# Patient Record
Sex: Female | Born: 1960
Health system: Southern US, Community
[De-identification: ages and names within clinical notes are randomized; demographics above are authoritative.]

## PROBLEM LIST (undated history)

## (undated) DIAGNOSIS — D649 Anemia, unspecified: Secondary | ICD-10-CM

## (undated) DIAGNOSIS — F419 Anxiety disorder, unspecified: Secondary | ICD-10-CM

## (undated) DIAGNOSIS — Z87442 Personal history of urinary calculi: Secondary | ICD-10-CM

## (undated) DIAGNOSIS — K219 Gastro-esophageal reflux disease without esophagitis: Secondary | ICD-10-CM

## (undated) DIAGNOSIS — F1110 Opioid abuse, uncomplicated: Secondary | ICD-10-CM

## (undated) DIAGNOSIS — M1990 Unspecified osteoarthritis, unspecified site: Secondary | ICD-10-CM

## (undated) DIAGNOSIS — M810 Age-related osteoporosis without current pathological fracture: Secondary | ICD-10-CM

## (undated) DIAGNOSIS — Z9884 Bariatric surgery status: Secondary | ICD-10-CM

## (undated) DIAGNOSIS — M069 Rheumatoid arthritis, unspecified: Secondary | ICD-10-CM

## (undated) DIAGNOSIS — R0600 Dyspnea, unspecified: Secondary | ICD-10-CM

## (undated) DIAGNOSIS — M359 Systemic involvement of connective tissue, unspecified: Secondary | ICD-10-CM

## (undated) DIAGNOSIS — K56609 Unspecified intestinal obstruction, unspecified as to partial versus complete obstruction: Secondary | ICD-10-CM

## (undated) DIAGNOSIS — I00 Rheumatic fever without heart involvement: Secondary | ICD-10-CM

## (undated) DIAGNOSIS — E039 Hypothyroidism, unspecified: Secondary | ICD-10-CM

## (undated) DIAGNOSIS — E079 Disorder of thyroid, unspecified: Secondary | ICD-10-CM

## (undated) DIAGNOSIS — Z9889 Other specified postprocedural states: Secondary | ICD-10-CM

## (undated) DIAGNOSIS — R011 Cardiac murmur, unspecified: Secondary | ICD-10-CM

## (undated) DIAGNOSIS — F319 Bipolar disorder, unspecified: Secondary | ICD-10-CM

## (undated) DIAGNOSIS — K760 Fatty (change of) liver, not elsewhere classified: Secondary | ICD-10-CM

## (undated) DIAGNOSIS — K5900 Constipation, unspecified: Secondary | ICD-10-CM

## (undated) DIAGNOSIS — I959 Hypotension, unspecified: Secondary | ICD-10-CM

## (undated) DIAGNOSIS — M797 Fibromyalgia: Secondary | ICD-10-CM

## (undated) DIAGNOSIS — R112 Nausea with vomiting, unspecified: Secondary | ICD-10-CM

## (undated) HISTORY — PX: HEEL SPUR EXCISION: SHX1733

## (undated) HISTORY — DX: Fibromyalgia: M79.7

## (undated) HISTORY — PX: TONSILLECTOMY: SUR1361

---

## 2001-07-31 HISTORY — PX: GASTRIC BYPASS: SHX52

## 2002-07-31 HISTORY — PX: CHOLECYSTECTOMY: SHX55

## 2002-10-29 ENCOUNTER — Encounter: Payer: Self-pay | Admitting: Rheumatology

## 2002-10-29 ENCOUNTER — Encounter: Admission: RE | Admit: 2002-10-29 | Discharge: 2002-10-29 | Payer: Self-pay | Admitting: Rheumatology

## 2003-12-15 ENCOUNTER — Other Ambulatory Visit: Payer: Self-pay

## 2004-06-16 ENCOUNTER — Encounter: Admission: RE | Admit: 2004-06-16 | Discharge: 2004-06-16 | Payer: Self-pay | Admitting: Unknown Physician Specialty

## 2004-09-22 ENCOUNTER — Encounter: Admission: RE | Admit: 2004-09-22 | Discharge: 2004-09-22 | Payer: Self-pay | Admitting: Unknown Physician Specialty

## 2004-11-24 ENCOUNTER — Encounter: Admission: RE | Admit: 2004-11-24 | Discharge: 2004-11-24 | Payer: Self-pay | Admitting: Unknown Physician Specialty

## 2004-12-01 ENCOUNTER — Encounter: Admission: RE | Admit: 2004-12-01 | Discharge: 2004-12-01 | Payer: Self-pay | Admitting: Dermatology

## 2004-12-29 ENCOUNTER — Encounter: Admission: RE | Admit: 2004-12-29 | Discharge: 2004-12-29 | Payer: Self-pay | Admitting: Dermatology

## 2005-01-10 ENCOUNTER — Encounter: Admission: RE | Admit: 2005-01-10 | Discharge: 2005-01-10 | Payer: Self-pay | Admitting: Interventional Radiology

## 2005-02-07 ENCOUNTER — Other Ambulatory Visit: Payer: Self-pay

## 2005-02-08 ENCOUNTER — Inpatient Hospital Stay: Payer: Self-pay | Admitting: Internal Medicine

## 2005-03-14 ENCOUNTER — Encounter: Admission: RE | Admit: 2005-03-14 | Discharge: 2005-03-14 | Payer: Self-pay | Admitting: Unknown Physician Specialty

## 2005-03-23 ENCOUNTER — Other Ambulatory Visit: Payer: Self-pay

## 2005-03-23 ENCOUNTER — Inpatient Hospital Stay: Payer: Self-pay | Admitting: Internal Medicine

## 2005-06-12 ENCOUNTER — Inpatient Hospital Stay: Payer: Self-pay | Admitting: Psychiatry

## 2005-06-12 ENCOUNTER — Other Ambulatory Visit: Payer: Self-pay

## 2005-06-12 ENCOUNTER — Inpatient Hospital Stay: Payer: Self-pay | Admitting: Infectious Diseases

## 2005-06-21 ENCOUNTER — Encounter: Admission: RE | Admit: 2005-06-21 | Discharge: 2005-06-21 | Payer: Self-pay | Admitting: Unknown Physician Specialty

## 2006-04-18 ENCOUNTER — Encounter: Payer: Self-pay | Admitting: Anesthesiology

## 2006-05-18 ENCOUNTER — Inpatient Hospital Stay: Payer: Self-pay | Admitting: Psychiatry

## 2006-05-18 ENCOUNTER — Other Ambulatory Visit: Payer: Self-pay

## 2006-05-24 ENCOUNTER — Ambulatory Visit: Payer: Self-pay | Admitting: Unknown Physician Specialty

## 2006-05-31 ENCOUNTER — Ambulatory Visit: Payer: Self-pay | Admitting: Unknown Physician Specialty

## 2007-08-01 HISTORY — PX: EXPLORATORY LAPAROTOMY: SUR591

## 2007-10-24 ENCOUNTER — Ambulatory Visit: Payer: Self-pay | Admitting: Internal Medicine

## 2007-12-26 ENCOUNTER — Ambulatory Visit: Payer: Self-pay | Admitting: Internal Medicine

## 2008-04-21 ENCOUNTER — Ambulatory Visit: Payer: Self-pay | Admitting: Internal Medicine

## 2008-04-23 ENCOUNTER — Ambulatory Visit: Payer: Self-pay | Admitting: Specialist

## 2008-06-22 ENCOUNTER — Ambulatory Visit: Payer: Self-pay | Admitting: Unknown Physician Specialty

## 2008-10-08 ENCOUNTER — Ambulatory Visit: Payer: Self-pay | Admitting: Unknown Physician Specialty

## 2009-08-28 ENCOUNTER — Ambulatory Visit: Payer: Self-pay

## 2009-09-01 ENCOUNTER — Other Ambulatory Visit: Payer: Self-pay

## 2009-09-02 ENCOUNTER — Ambulatory Visit: Payer: Self-pay

## 2010-07-07 ENCOUNTER — Ambulatory Visit: Payer: Self-pay

## 2010-07-17 ENCOUNTER — Emergency Department: Payer: Self-pay | Admitting: Emergency Medicine

## 2010-07-22 ENCOUNTER — Ambulatory Visit: Payer: Self-pay | Admitting: Internal Medicine

## 2010-08-02 ENCOUNTER — Ambulatory Visit: Payer: Self-pay | Admitting: Unknown Physician Specialty

## 2010-08-30 ENCOUNTER — Ambulatory Visit: Payer: Self-pay | Admitting: Gastroenterology

## 2010-08-31 ENCOUNTER — Ambulatory Visit: Payer: Self-pay | Admitting: Gastroenterology

## 2010-10-12 ENCOUNTER — Ambulatory Visit: Payer: Self-pay | Admitting: Internal Medicine

## 2010-10-30 ENCOUNTER — Ambulatory Visit: Payer: Self-pay | Admitting: Internal Medicine

## 2011-04-13 ENCOUNTER — Ambulatory Visit: Payer: Self-pay | Admitting: Gastroenterology

## 2011-07-05 ENCOUNTER — Ambulatory Visit: Payer: Self-pay

## 2011-07-10 LAB — PATHOLOGY REPORT

## 2011-09-23 ENCOUNTER — Emergency Department: Payer: Self-pay | Admitting: Emergency Medicine

## 2012-02-16 ENCOUNTER — Emergency Department: Payer: Self-pay | Admitting: Emergency Medicine

## 2012-02-29 ENCOUNTER — Ambulatory Visit: Payer: Self-pay

## 2012-08-07 ENCOUNTER — Ambulatory Visit: Payer: Self-pay | Admitting: Unknown Physician Specialty

## 2013-04-16 ENCOUNTER — Ambulatory Visit: Payer: Self-pay | Admitting: Physician Assistant

## 2013-04-24 ENCOUNTER — Emergency Department: Payer: Self-pay | Admitting: Emergency Medicine

## 2013-05-07 ENCOUNTER — Ambulatory Visit: Payer: Self-pay | Admitting: Internal Medicine

## 2013-05-07 ENCOUNTER — Ambulatory Visit: Payer: Self-pay | Admitting: Hematology and Oncology

## 2013-05-09 ENCOUNTER — Ambulatory Visit: Payer: Self-pay | Admitting: Hematology and Oncology

## 2013-05-09 LAB — CBC CANCER CENTER
Basophil #: 0 x10 3/mm (ref 0.0–0.1)
Basophil %: 0.8 %
Eosinophil #: 0.2 x10 3/mm (ref 0.0–0.7)
Eosinophil %: 5.3 %
HCT: 31.3 % — ABNORMAL LOW (ref 35.0–47.0)
HGB: 10.4 g/dL — ABNORMAL LOW (ref 12.0–16.0)
Lymphocyte #: 1.6 x10 3/mm (ref 1.0–3.6)
Lymphocyte %: 53 %
MCH: 32.2 pg (ref 26.0–34.0)
MCHC: 33.1 g/dL (ref 32.0–36.0)
MCV: 97 fL (ref 80–100)
Monocyte #: 0.2 x10 3/mm (ref 0.2–0.9)
Monocyte %: 6.3 %
Neutrophil #: 1 x10 3/mm — ABNORMAL LOW (ref 1.4–6.5)
Neutrophil %: 34.6 %
Platelet: 370 x10 3/mm (ref 150–440)
RBC: 3.22 10*6/uL — ABNORMAL LOW (ref 3.80–5.20)
RDW: 16.7 % — ABNORMAL HIGH (ref 11.5–14.5)
WBC: 3 x10 3/mm — ABNORMAL LOW (ref 3.6–11.0)

## 2013-05-31 ENCOUNTER — Ambulatory Visit: Payer: Self-pay | Admitting: Hematology and Oncology

## 2013-07-04 ENCOUNTER — Ambulatory Visit: Payer: Self-pay | Admitting: Hematology and Oncology

## 2013-07-10 ENCOUNTER — Emergency Department: Payer: Self-pay | Admitting: Emergency Medicine

## 2013-07-10 LAB — HEPATIC FUNCTION PANEL A (ARMC)
Albumin: 2.8 g/dL — ABNORMAL LOW
Alkaline Phosphatase: 88 U/L
Bilirubin, Direct: 0.2 mg/dL
Bilirubin,Total: 0.3 mg/dL
SGOT(AST): 72 U/L — ABNORMAL HIGH
SGPT (ALT): 31 U/L
Total Protein: 6.4 g/dL

## 2013-07-10 LAB — CBC WITH DIFFERENTIAL/PLATELET
Basophil #: 0 10*3/uL (ref 0.0–0.1)
Basophil %: 0.7 %
Eosinophil #: 0.2 10*3/uL (ref 0.0–0.7)
Eosinophil %: 3.4 %
HCT: 34.5 % — ABNORMAL LOW (ref 35.0–47.0)
HGB: 11 g/dL — ABNORMAL LOW (ref 12.0–16.0)
Lymphocyte #: 2.6 10*3/uL (ref 1.0–3.6)
Lymphocyte %: 58 %
MCH: 30.9 pg (ref 26.0–34.0)
MCHC: 32 g/dL (ref 32.0–36.0)
MCV: 97 fL (ref 80–100)
Monocyte #: 0.5 x10 3/mm (ref 0.2–0.9)
Monocyte %: 11.1 %
Neutrophil #: 1.2 10*3/uL — ABNORMAL LOW (ref 1.4–6.5)
Neutrophil %: 26.8 %
Platelet: 334 10*3/uL (ref 150–440)
RBC: 3.57 10*6/uL — ABNORMAL LOW (ref 3.80–5.20)
RDW: 15.4 % — ABNORMAL HIGH (ref 11.5–14.5)
WBC: 4.4 10*3/uL (ref 3.6–11.0)

## 2013-07-10 LAB — BASIC METABOLIC PANEL WITH GFR
Anion Gap: 5 — ABNORMAL LOW
BUN: 4 mg/dL — ABNORMAL LOW
Calcium, Total: 8.8 mg/dL
Chloride: 110 mmol/L — ABNORMAL HIGH
Co2: 30 mmol/L
Creatinine: 0.59 mg/dL — ABNORMAL LOW
EGFR (African American): >60
EGFR (Non-African Amer.): >60
Glucose: 113 mg/dL — ABNORMAL HIGH
Osmolality: 286
Potassium: 3.3 mmol/L — ABNORMAL LOW
Sodium: 145 mmol/L

## 2013-07-10 LAB — TROPONIN I: Troponin-I: <0.02 ng/mL

## 2013-07-10 LAB — AMMONIA: Ammonia, Plasma: <25 umol/L

## 2013-07-11 LAB — CBC CANCER CENTER
Basophil #: 0 x10 3/mm (ref 0.0–0.1)
Basophil %: 0.4 %
Eosinophil #: 0.1 x10 3/mm (ref 0.0–0.7)
Eosinophil %: 1.8 %
HCT: 34.2 % — ABNORMAL LOW (ref 35.0–47.0)
HGB: 11.1 g/dL — ABNORMAL LOW (ref 12.0–16.0)
Lymphocyte #: 2.2 x10 3/mm (ref 1.0–3.6)
Lymphocyte %: 48.5 %
MCH: 31.6 pg (ref 26.0–34.0)
MCHC: 32.5 g/dL (ref 32.0–36.0)
MCV: 97 fL (ref 80–100)
Monocyte #: 0.5 x10 3/mm (ref 0.2–0.9)
Monocyte %: 10.2 %
Neutrophil #: 1.8 x10 3/mm (ref 1.4–6.5)
Neutrophil %: 39.1 %
Platelet: 319 x10 3/mm (ref 150–440)
RBC: 3.52 10*6/uL — ABNORMAL LOW (ref 3.80–5.20)
RDW: 15.5 % — ABNORMAL HIGH (ref 11.5–14.5)
WBC: 4.5 x10 3/mm (ref 3.6–11.0)

## 2013-07-11 LAB — TSH: Thyroid Stimulating Horm: 3.34 u[IU]/mL

## 2013-07-11 LAB — URINALYSIS, COMPLETE
Bilirubin,UR: NEGATIVE
Glucose,UR: NEGATIVE mg/dL (ref 0–75)
Ketone: NEGATIVE
Nitrite: NEGATIVE
Ph: 7 (ref 4.5–8.0)
Protein: NEGATIVE
RBC,UR: 5 /HPF (ref 0–5)
Specific Gravity: 1.006 (ref 1.003–1.030)
Squamous Epithelial: <1
WBC UR: 248 /HPF (ref 0–5)

## 2013-07-11 LAB — COMPREHENSIVE METABOLIC PANEL
Albumin: 2.6 g/dL — ABNORMAL LOW (ref 3.4–5.0)
Alkaline Phosphatase: 91 U/L
Anion Gap: 9 (ref 7–16)
BUN: 6 mg/dL — ABNORMAL LOW (ref 7–18)
Bilirubin,Total: 0.3 mg/dL (ref 0.2–1.0)
Calcium, Total: 8.2 mg/dL — ABNORMAL LOW (ref 8.5–10.1)
Chloride: 110 mmol/L — ABNORMAL HIGH (ref 98–107)
Co2: 24 mmol/L (ref 21–32)
Creatinine: 0.64 mg/dL (ref 0.60–1.30)
EGFR (African American): >60
EGFR (Non-African Amer.): >60
Glucose: 135 mg/dL — ABNORMAL HIGH (ref 65–99)
Osmolality: 285 (ref 275–301)
Potassium: 3.2 mmol/L — ABNORMAL LOW (ref 3.5–5.1)
SGOT(AST): 148 U/L — ABNORMAL HIGH (ref 15–37)
SGPT (ALT): 41 U/L (ref 12–78)
Sodium: 143 mmol/L (ref 136–145)
Total Protein: 6.1 g/dL — ABNORMAL LOW (ref 6.4–8.2)

## 2013-07-11 LAB — FERRITIN: Ferritin (ARMC): 46 ng/mL (ref 8–388)

## 2013-07-11 LAB — IRON AND TIBC
Iron Bind.Cap.(Total): 309 ug/dL (ref 250–450)
Iron Saturation: 71 %
Iron: 220 ug/dL — ABNORMAL HIGH (ref 50–170)
Unbound Iron-Bind.Cap.: 89 ug/dL

## 2013-07-31 ENCOUNTER — Ambulatory Visit: Payer: Self-pay | Admitting: Hematology and Oncology

## 2013-08-11 ENCOUNTER — Emergency Department (HOSPITAL_COMMUNITY)
Admission: EM | Admit: 2013-08-11 | Discharge: 2013-08-11 | Disposition: A | Discharge status: Home / Self Care | Payer: Commercial Indemnity | Attending: Emergency Medicine | Admitting: Emergency Medicine

## 2013-08-11 ENCOUNTER — Encounter (HOSPITAL_COMMUNITY): Payer: Self-pay | Admitting: Emergency Medicine

## 2013-08-11 ENCOUNTER — Emergency Department (HOSPITAL_COMMUNITY): Payer: Commercial Indemnity

## 2013-08-11 DIAGNOSIS — R5383 Other fatigue: Principal | ICD-10-CM

## 2013-08-11 DIAGNOSIS — M1990 Unspecified osteoarthritis, unspecified site: Secondary | ICD-10-CM | POA: Insufficient documentation

## 2013-08-11 DIAGNOSIS — E079 Disorder of thyroid, unspecified: Secondary | ICD-10-CM | POA: Insufficient documentation

## 2013-08-11 DIAGNOSIS — M069 Rheumatoid arthritis, unspecified: Secondary | ICD-10-CM | POA: Insufficient documentation

## 2013-08-11 DIAGNOSIS — R531 Weakness: Secondary | ICD-10-CM

## 2013-08-11 DIAGNOSIS — F411 Generalized anxiety disorder: Secondary | ICD-10-CM | POA: Insufficient documentation

## 2013-08-11 DIAGNOSIS — F319 Bipolar disorder, unspecified: Secondary | ICD-10-CM | POA: Insufficient documentation

## 2013-08-11 DIAGNOSIS — K5900 Constipation, unspecified: Secondary | ICD-10-CM | POA: Insufficient documentation

## 2013-08-11 DIAGNOSIS — Z8679 Personal history of other diseases of the circulatory system: Secondary | ICD-10-CM | POA: Insufficient documentation

## 2013-08-11 DIAGNOSIS — M949 Disorder of cartilage, unspecified: Secondary | ICD-10-CM

## 2013-08-11 DIAGNOSIS — M899 Disorder of bone, unspecified: Secondary | ICD-10-CM | POA: Insufficient documentation

## 2013-08-11 DIAGNOSIS — D649 Anemia, unspecified: Secondary | ICD-10-CM | POA: Insufficient documentation

## 2013-08-11 DIAGNOSIS — Z79899 Other long term (current) drug therapy: Secondary | ICD-10-CM | POA: Insufficient documentation

## 2013-08-11 DIAGNOSIS — R5381 Other malaise: Secondary | ICD-10-CM | POA: Insufficient documentation

## 2013-08-11 HISTORY — DX: Anemia, unspecified: D64.9

## 2013-08-11 HISTORY — DX: Disorder of thyroid, unspecified: E07.9

## 2013-08-11 HISTORY — DX: Unspecified osteoarthritis, unspecified site: M19.90

## 2013-08-11 HISTORY — DX: Rheumatoid arthritis, unspecified: M06.9

## 2013-08-11 HISTORY — DX: Age-related osteoporosis without current pathological fracture: M81.0

## 2013-08-11 HISTORY — DX: Anxiety disorder, unspecified: F41.9

## 2013-08-11 HISTORY — DX: Constipation, unspecified: K59.00

## 2013-08-11 HISTORY — DX: Hypotension, unspecified: I95.9

## 2013-08-11 HISTORY — DX: Bipolar disorder, unspecified: F31.9

## 2013-08-11 LAB — URINALYSIS, ROUTINE W REFLEX MICROSCOPIC
Glucose, UA: NEGATIVE mg/dL
Hgb urine dipstick: NEGATIVE
Ketones, ur: NEGATIVE mg/dL
Nitrite: NEGATIVE
Protein, ur: NEGATIVE mg/dL
Specific Gravity, Urine: 1.023 (ref 1.005–1.030)
Urobilinogen, UA: 0.2 mg/dL (ref 0.0–1.0)
pH: 5.5 (ref 5.0–8.0)

## 2013-08-11 LAB — URINE MICROSCOPIC-ADD ON

## 2013-08-11 LAB — COMPREHENSIVE METABOLIC PANEL
ALT: 9 U/L (ref 0–35)
AST: 21 U/L (ref 0–37)
Albumin: 2.8 g/dL — ABNORMAL LOW (ref 3.5–5.2)
Alkaline Phosphatase: 157 U/L — ABNORMAL HIGH (ref 39–117)
BUN: 11 mg/dL (ref 6–23)
CO2: 27 mEq/L (ref 19–32)
Calcium: 8.3 mg/dL — ABNORMAL LOW (ref 8.4–10.5)
Chloride: 105 mEq/L (ref 96–112)
Creatinine, Ser: 0.58 mg/dL (ref 0.50–1.10)
GFR calc Af Amer: >90 mL/min (ref >90)
GFR calc non Af Amer: >90 mL/min (ref >90)
Glucose, Bld: 95 mg/dL (ref 70–99)
Potassium: 3.4 mEq/L — ABNORMAL LOW (ref 3.7–5.3)
Sodium: 144 mEq/L (ref 137–147)
Total Bilirubin: 0.2 mg/dL — ABNORMAL LOW (ref 0.3–1.2)
Total Protein: 6 g/dL (ref 6.0–8.3)

## 2013-08-11 LAB — CBC WITH DIFFERENTIAL/PLATELET
Basophils Absolute: 0 10*3/uL (ref 0.0–0.1)
Basophils Relative: 0 % (ref 0–1)
Eosinophils Absolute: 0.3 10*3/uL (ref 0.0–0.7)
Eosinophils Relative: 7 % — ABNORMAL HIGH (ref 0–5)
HCT: 27 % — ABNORMAL LOW (ref 36.0–46.0)
Hemoglobin: 8.5 g/dL — ABNORMAL LOW (ref 12.0–15.0)
Lymphocytes Relative: 43 % (ref 12–46)
Lymphs Abs: 1.5 10*3/uL (ref 0.7–4.0)
MCH: 33.2 pg (ref 26.0–34.0)
MCHC: 31.5 g/dL (ref 30.0–36.0)
MCV: 105.5 fL — ABNORMAL HIGH (ref 78.0–100.0)
Monocytes Absolute: 0.3 10*3/uL (ref 0.1–1.0)
Monocytes Relative: 9 % (ref 3–12)
Neutro Abs: 1.4 10*3/uL — ABNORMAL LOW (ref 1.7–7.7)
Neutrophils Relative %: 40 % — ABNORMAL LOW (ref 43–77)
Platelets: 224 10*3/uL (ref 150–400)
RBC: 2.56 MIL/uL — ABNORMAL LOW (ref 3.87–5.11)
RDW: 15.6 % — ABNORMAL HIGH (ref 11.5–15.5)
WBC: 3.4 10*3/uL — ABNORMAL LOW (ref 4.0–10.5)

## 2013-08-11 LAB — RAPID URINE DRUG SCREEN, HOSP PERFORMED
Amphetamines: NOT DETECTED
Barbiturates: NOT DETECTED
Benzodiazepines: POSITIVE — AB
Cocaine: NOT DETECTED
Opiates: NOT DETECTED
Tetrahydrocannabinol: NOT DETECTED

## 2013-08-11 LAB — GLUCOSE, CAPILLARY: Glucose-Capillary: 85 mg/dL (ref 70–99)

## 2013-08-11 LAB — TROPONIN I: Troponin I: <0.3 ng/mL (ref <0.30)

## 2013-08-11 NOTE — ED Notes (Signed)
Not urinated since last nite

## 2013-08-11 NOTE — ED Notes (Signed)
Case manager at bedside 

## 2013-08-11 NOTE — ED Notes (Signed)
Pt states that weakness  That comes and goes has been going on for a month has seen [facility] and multiple drs and they cannot find anything. pt states th for this but they did not find anything weakness has gotten worse over the last 2 days husband took her sugar this am and it was 50 since she has had an energy bar spaggo and bread tea and pineapple juice pt states she is bipolar and has anxiety attacks and she had one of those

## 2013-08-11 NOTE — ED Notes (Signed)
Return to room

## 2013-08-11 NOTE — Progress Notes (Addendum)
ED CM received referral from J Piepenbrink PA-C. Pt presented to ED with weakness and hypotension. Pt has hx of  rheumatoid arthritis, osteoarthritis, osteoporosis, thyroid disease, hypertension, anemia, constipation, hypotension, anemia, bipolar 1 disorder, anxiety. In room to speak with patient, husband at bedside. Permission obtained to discuss information in the presence of husband. Pt and  Husband states, that she is able to ambulate but needs to grab on to wall  for balance at times, husband believes she may benefit from a  Walker. She states this is a transient issue, it comes and goes, as per patient.   She  Pt reports a long history 2-3 page list of documented symptoms and scheduled doctor's appointments through April 2015 with similar symptoms that she is complaining about today that the husband shared with ED CM. She has had an extensive work-up with no significant finding. Pt and husband not happy with PCP states, they are in the midst of switching to another PCP has an appointment on Jan 22, with new PCP.  Pt is encouraged to f/u with the PCP appointment to establish care. Pt declines recommendation for [facility] but agrees with  Plan for Rolling for home use.  Pt. And husband verbalized understanding  And teach back method used.   Pt has [employer],  contacted  [employer] regarding DME coverage. Informed that patient has a out of pocket $500 deductible for in network DME. DME list provided and  Choice was given. Pt and husband decided on [facility] in [place] [phone].  Referral faxed in to [phone]. Fax confirmation received. Husband will contact them in the morning regarding picking up rolling walker. No further ED CM needs identified.

## 2013-08-11 NOTE — ED Provider Notes (Signed)
CSN: [id number]     Arrival date & time 08/11/13  1226 History   First MD Initiated Contact with Patient 08/11/13 1258     Chief Complaint  Patient presents with  . Weakness  . Hypotension   (Consider location/radiation/quality/duration/timing/severity/associated sxs/prior Treatment) HPI Comments: Patient is a 53 year old female past medical history significant for rheumatoid arthritis, osteoarthritis, osteoporosis, thyroid disease, hypertension, anemia, constipation, hypotension, anemia, bipolar 1 disorder, anxiety presented to the emergency department for multiple complaints. Patient's first complaint is several months of intermittent generalized diffuse weakness but does not lateralize to one extremity. Patient has been seen in the last month by neurology, gastroenterology, hematology with negative workups including but not limited to lab tests, EEGs, for increased fatigue and weakness without any findings. Patient and her husband have a 2-3 page list of documented symptoms and scheduled doctor's appointments through January 2013 with similar symptoms that she is complaining about today that the husband shared with me during evaluation. She denies any new acute changes to her symptoms in the last week. Patient's second complaint is an incident of hypoglycemia last evening with some slurred speech and visual disturbances. Her husband checked her glucose level last evening and it was 50. She was able to tolerate PO intake and her symptoms resolved. Patient has no history of DM, hypoglycemia.   The history is provided by the patient and the spouse.    Past Medical History  Diagnosis Date  . Osteoarthritis   . Osteoporosis   . Rheumatoid arthritis   . Thyroid disease   . Hypotension   . Constipation   . Anemia   . Bipolar 1 disorder   . Anxiety    Past Surgical History  Procedure Laterality Date  . Gastric bypass    . Cholecystectomy     No family history on file. History  Substance  Use Topics  . Smoking status: Never Smoker   . Smokeless tobacco: Not on file  . Alcohol Use: No   OB History   Grav Para Term Preterm Abortions TAB SAB Ect Mult Living                 Review of Systems  Constitutional: Positive for fatigue. Negative for fever and chills.  Respiratory: Negative for shortness of breath.   Cardiovascular: Negative for chest pain.  Gastrointestinal: Negative for nausea, vomiting and abdominal pain.  Neurological: Positive for weakness (generalized). Negative for dizziness, seizures, syncope, light-headedness, numbness and headaches.  All other systems reviewed and are negative.    Allergies  Review of patient's allergies indicates no known allergies.  Home Medications   Current Outpatient Rx  Name  Route  Sig  Dispense  Refill  . adalimumab (HUMIRA) 40 MG/0.8ML injection   Subcutaneous   Inject 40 mg into the skin every 14 (fourteen) days.         Marland Kitchen alendronate (FOSAMAX) 70 MG tablet   Oral   Take 70 mg by mouth once a week. Take with a full glass of water on an empty stomach.         Marland Kitchen buPROPion (WELLBUTRIN) 100 MG tablet   Oral   Take 100 mg by mouth 3 (three) times daily.         . Cholecalciferol (VITAMIN D3) 400 UNITS CAPS   Oral   Take 1 tablet by mouth daily.         . Cyanocobalamin (VITAMIN B-12 IJ)   Injection   Inject as directed every 30 (thirty) days.         Marland Kitchen  diazepam (VALIUM) 5 MG tablet   Oral   Take 5 mg by mouth 3 (three) times daily as needed for anxiety.         Marland Kitchen esomeprazole (NEXIUM) 40 MG capsule   Oral   Take 40 mg by mouth daily at 12 noon.         . furosemide (LASIX) 20 MG tablet   Oral   Take 20 mg by mouth daily as needed.         Marland Kitchen LACTULOSE PO   Oral   Take 30 mLs by mouth 3 (three) times daily.         Marland Kitchen lamoTRIgine (LAMICTAL) 200 MG tablet   Oral   Take 200 mg by mouth daily.         Marland Kitchen levothyroxine (SYNTHROID, LEVOTHROID) 75 MCG tablet   Oral   Take 75 mcg by  mouth daily before breakfast.         . Loratadine-Pseudoephedrine (CLARITIN-D 12 HOUR PO)   Oral   Take 1 tablet by mouth 2 (two) times daily as needed (allergies).         . Lurasidone HCl 120 MG TABS   Oral   Take 120 mg by mouth at bedtime.         . modafinil (PROVIGIL) 200 MG tablet   Oral   Take 200 mg by mouth 3 (three) times daily.         . Multiple Vitamins-Minerals (MULTIVITAMIN PO)   Oral   Take 1 tablet by mouth daily.         . Nutritional Supplements (ESTROVEN PO)   Oral   Take 1 tablet by mouth daily.         Marland Kitchen OLANZapine-FLUoxetine (SYMBYAX) 3-25 MG per capsule   Oral   Take 1 capsule by mouth every evening.         . rifaximin (XIFAXAN) 550 MG TABS tablet   Oral   Take 550 mg by mouth 2 (two) times daily.         . sucralfate (CARAFATE) 1 G tablet   Oral   Take 1 g by mouth 2 (two) times daily as needed (tomach pain).         . ursodiol (ACTIGALL) 300 MG capsule   Oral   Take 300 mg by mouth 2 (two) times daily.          BP 97/63  Pulse 75  Temp(Src) 98.4 F (36.9 C) (Oral)  Resp 20  SpO2 92% Physical Exam  Constitutional: She is oriented to person, place, and time. She appears well-developed and well-nourished. No distress.  HENT:  Head: Normocephalic and atraumatic.  Right Ear: External ear normal.  Left Ear: External ear normal.  Nose: Nose normal.  Mouth/Throat: Oropharynx is clear and moist. No oropharyngeal exudate.  Eyes: Conjunctivae and EOM are normal. Pupils are equal, round, and reactive to light.  Neck: Normal range of motion. Neck supple.  Cardiovascular: Normal rate, regular rhythm, normal heart sounds and intact distal pulses.   Pulmonary/Chest: Effort normal and breath sounds normal. No respiratory distress. She exhibits no tenderness.  Abdominal: Soft. There is no tenderness.  Musculoskeletal: Normal range of motion. She exhibits no tenderness.  Neurological: She is alert and oriented to person, place,  and time. She has normal strength and normal reflexes. She displays normal reflexes. No cranial nerve deficit or sensory deficit. Gait normal. GCS eye subscore is 4. GCS verbal subscore is 5. GCS motor subscore  is 6. She displays no Babinski's sign on the right side. She displays no Babinski's sign on the left side.  No pronator drift. Bilateral heel-knee-shin intact. Patient able to move herself from wheelchair to bed without difficulty or assistance. No asterixis appreciated on examination.  Skin: Skin is warm and dry. She is not diaphoretic.    ED Course  Procedures (including critical care time) Labs Review Labs Reviewed  COMPREHENSIVE METABOLIC PANEL - Abnormal; Notable for the following:    Potassium 3.4 (*)    Calcium 8.3 (*)    Albumin 2.8 (*)    Alkaline Phosphatase 157 (*)    Total Bilirubin 0.2 (*)    All other components within normal limits  CBC WITH DIFFERENTIAL - Abnormal; Notable for the following:    WBC 3.4 (*)    RBC 2.56 (*)    Hemoglobin 8.5 (*)    HCT 27.0 (*)    MCV 105.5 (*)    RDW 15.6 (*)    Neutrophils Relative % 40 (*)    Neutro Abs 1.4 (*)    Eosinophils Relative 7 (*)    All other components within normal limits  URINALYSIS, ROUTINE W REFLEX MICROSCOPIC - Abnormal; Notable for the following:    Color, Urine AMBER (*)    APPearance CLOUDY (*)    Bilirubin Urine SMALL (*)    Leukocytes, UA SMALL (*)    All other components within normal limits  URINE RAPID DRUG SCREEN (HOSP PERFORMED) - Abnormal; Notable for the following:    Benzodiazepines POSITIVE (*)    All other components within normal limits  URINE MICROSCOPIC-ADD ON - Abnormal; Notable for the following:    Crystals CA OXALATE CRYSTALS (*)    All other components within normal limits  GLUCOSE, CAPILLARY  TROPONIN I   Imaging Review Dg Chest 2 View  08/11/2013   CLINICAL DATA:  cough  EXAM: CHEST  2 VIEW  COMPARISON:  None.  FINDINGS: Normal mediastinum and cardiac silhouette. Mild  central venous pulmonary congestion. No evidence of effusion, infiltrate, or pneumothorax. No acute bony abnormality.  IMPRESSION: Mild central venous pulmonary congestion.   Electronically Signed   By: Suzy Bouchard M.D.   On: 08/11/2013 14:34    EKG Interpretation    Date/Time:  Monday August 11 2013 12:55:32 EST Ventricular Rate:  75 PR Interval:  146 QRS Duration: 84 QT Interval:  400 QTC Calculation: 446 R Axis:   23 Text Interpretation:  Normal sinus rhythm Cannot rule out Anterior infarct , age undetermined Abnormal ECG Confirmed by WARD  DO, KRISTEN ([id number]) on 08/11/2013 1:11:46 PM            MDM   1. Generalized weakness     Afebrile, NAD, non-toxic appearing, AAOx4. No neuro focal deficits on examination. DTRs normal and symmetrical. Patient able to move herself from wheelchair to bed without difficulty. Blood sugar normal at 85. Patient tolerating by mouth intake without difficulty. Labs reviewed without any acute or specific abnormality. Anemia noted with long-standing history, recent hematology evaluation within the last 3 weeks. Troponin negative, history unremarkable normal sinus rhythm, chest x-ray significant finding. Had extensive conversation, 30 minutes, with patient and husband regarding work up findings and that with chronicity of problems that it was highly unlikely to find any reason behind long standing problems. Discussed that patient needs to follow up with at PCP appointment and her specialists for continued medical evaluations to find underlying problem. Will have Case manager discuss possible at home  options for patient and family. They will provide patient a walker for at home use. Case manager again had extensive conversation regarding need for PCP f/u. Return precautions discussed. Patient is agreeable to plan. Patient is stable at time of discharge       Harlow Mares, PA-C 08/11/13 1837

## 2013-08-11 NOTE — ED Provider Notes (Signed)
Medical screening examination/treatment/procedure(s) were performed by non-physician practitioner and as supervising physician I was immediately available for consultation/collaboration.  EKG Interpretation    Date/Time:  Monday August 11 2013 12:55:32 EST Ventricular Rate:  75 PR Interval:  146 QRS Duration: 84 QT Interval:  400 QTC Calculation: 446 R Axis:   23 Text Interpretation:  Normal sinus rhythm Cannot rule out Anterior infarct , age undetermined Abnormal ECG Confirmed by Echo Propp  DO, Katesha Eichel ([id number]) on 08/11/2013 1:11:46 PM              [place], DO 08/11/13 1842

## 2013-08-11 NOTE — ED Notes (Signed)
Bladder scanned patient: 446ml. Pt. Ambulated to restroom with assist from family. Pt. Was able to urinate. Bladder scan post urination: 38ml

## 2013-08-11 NOTE — ED Notes (Signed)
Pt is here with weakness, hypoglycemia, hypotension.  Pt has some slurred speech.  Hypoglycemia started last nite, reports fuzzy vision

## 2013-08-11 NOTE — Discharge Instructions (Signed)
Please follow up with your primary care physician in 1-2 days. If you do not have one please call the [facility] number listed above. Please read all discharge instructions and return precautions.    Fatigue Fatigue is a feeling of tiredness, lack of energy, lack of motivation, or feeling tired all the time. Having enough rest, good nutrition, and reducing stress will normally reduce fatigue. Consult your caregiver if it persists. The nature of your fatigue will help your caregiver to find out its cause. The treatment is based on the cause.  CAUSES  There are many causes for fatigue. Most of the time, fatigue can be traced to one or more of your habits or routines. Most causes fit into one or more of three general areas. They are: Lifestyle problems  Sleep disturbances.  Overwork.  Physical exertion.  Unhealthy habits.  Poor eating habits or eating disorders.  Alcohol and/or drug use .  Lack of proper nutrition (malnutrition). Psychological problems  Stress and/or anxiety problems.  Depression.  Grief.  Boredom. Medical Problems or Conditions  Anemia.  Pregnancy.  Thyroid gland problems.  Recovery from major surgery.  Continuous pain.  Emphysema or asthma that is not well controlled  Allergic conditions.  Diabetes.  Infections (such as mononucleosis).  Obesity.  Sleep disorders, such as sleep apnea.  Heart failure or other heart-related problems.  Cancer.  Kidney disease.  Liver disease.  Effects of certain medicines such as antihistamines, cough and cold remedies, prescription pain medicines, heart and blood pressure medicines, drugs used for treatment of cancer, and some antidepressants. SYMPTOMS  The symptoms of fatigue include:   Lack of energy.  Lack of drive (motivation).  Drowsiness.  Feeling of indifference to the surroundings. DIAGNOSIS  The details of how you feel help guide your caregiver in finding out what is  causing the fatigue. You will be asked about your present and past health condition. It is important to review all medicines that you take, including prescription and non-prescription items. A thorough exam will be done. You will be questioned about your feelings, habits, and normal lifestyle. Your caregiver may suggest blood tests, urine tests, or other tests to look for common medical causes of fatigue.  TREATMENT  Fatigue is treated by correcting the underlying cause. For example, if you have continuous pain or depression, treating these causes will improve how you feel. Similarly, adjusting the dose of certain medicines will help in reducing fatigue.  HOME CARE INSTRUCTIONS   Try to get the required amount of good sleep every night.  Eat a healthy and nutritious diet, and drink enough water throughout the day.  Practice ways of relaxing (including yoga or meditation).  Exercise regularly.  Make plans to change situations that cause stress. Act on those plans so that stresses decrease over time. Keep your work and personal routine reasonable.  Avoid street drugs and minimize use of alcohol.  Start taking a daily multivitamin after consulting your caregiver. SEEK MEDICAL CARE IF:   You have persistent tiredness, which cannot be accounted for.  You have fever.  You have unintentional weight loss.  You have headaches.  You have disturbed sleep throughout the night.  You are feeling sad.  You have constipation.  You have dry skin.  You have gained weight.  You are taking any new or different medicines that you suspect are causing fatigue.  You are unable to sleep at night.  You develop any unusual swelling of your legs or other  parts of your body. SEEK IMMEDIATE MEDICAL CARE IF:   You are feeling confused.  Your vision is blurred.  You feel faint or pass out.  You develop severe headache.  You develop severe abdominal, pelvic, or back pain.  You develop chest  pain, shortness of breath, or an irregular or fast heartbeat.  You are unable to pass a normal amount of urine.  You develop abnormal bleeding such as bleeding from the rectum or you vomit blood.  You have thoughts about harming yourself or committing suicide.  You are worried that you might harm someone else. MAKE SURE YOU:   Understand these instructions.  Will watch your condition.  Will get help right away if you are not doing well or get worse. Document Released: 05/14/2007 Document Revised: 10/09/2011 Document Reviewed: 05/14/2007 [facility] Patient Information 2014 [place].

## 2013-08-11 NOTE — ED Notes (Signed)
Pt. Stating she felt like her CBG was low, husband checked CBG with home machine. CBG 75. Pt. Given orange juice per request. PA notified.

## 2013-08-16 ENCOUNTER — Inpatient Hospital Stay: Payer: Self-pay | Admitting: Family Medicine

## 2013-08-16 ENCOUNTER — Ambulatory Visit: Payer: Self-pay | Admitting: Neurology

## 2013-08-16 LAB — CBC WITH DIFFERENTIAL/PLATELET
Basophil #: 0 10*3/uL (ref 0.0–0.1)
Basophil %: 1 %
Eosinophil #: 0.1 10*3/uL (ref 0.0–0.7)
Eosinophil %: 4.4 %
HCT: 28 % — ABNORMAL LOW (ref 35.0–47.0)
HGB: 9.3 g/dL — ABNORMAL LOW (ref 12.0–16.0)
Lymphocyte #: 0.9 10*3/uL — ABNORMAL LOW (ref 1.0–3.6)
Lymphocyte %: 30.6 %
MCH: 33.4 pg (ref 26.0–34.0)
MCHC: 33.3 g/dL (ref 32.0–36.0)
MCV: 100 fL (ref 80–100)
Monocyte #: 0.2 x10 3/mm (ref 0.2–0.9)
Monocyte %: 5.9 %
Neutrophil #: 1.7 10*3/uL (ref 1.4–6.5)
Neutrophil %: 58.1 %
Platelet: 297 10*3/uL (ref 150–440)
RBC: 2.79 10*6/uL — ABNORMAL LOW (ref 3.80–5.20)
RDW: 15.8 % — ABNORMAL HIGH (ref 11.5–14.5)
WBC: 3 10*3/uL — ABNORMAL LOW (ref 3.6–11.0)

## 2013-08-16 LAB — URINALYSIS, COMPLETE
Bilirubin,UR: NEGATIVE
Blood: NEGATIVE
Glucose,UR: NEGATIVE mg/dL (ref 0–75)
Hyaline Cast: 71
Leukocyte Esterase: NEGATIVE
Nitrite: NEGATIVE
Ph: 5 (ref 4.5–8.0)
Protein: NEGATIVE
RBC,UR: 3 /HPF (ref 0–5)
Specific Gravity: 1.019 (ref 1.003–1.030)
Squamous Epithelial: 1
WBC UR: 6 /HPF (ref 0–5)

## 2013-08-16 LAB — COMPREHENSIVE METABOLIC PANEL
Albumin: 3.1 g/dL — ABNORMAL LOW (ref 3.4–5.0)
Alkaline Phosphatase: 183 U/L — ABNORMAL HIGH
Anion Gap: 8 (ref 7–16)
BUN: 13 mg/dL (ref 7–18)
Bilirubin,Total: 0.2 mg/dL (ref 0.2–1.0)
Calcium, Total: 8.2 mg/dL — ABNORMAL LOW (ref 8.5–10.1)
Chloride: 102 mmol/L (ref 98–107)
Co2: 28 mmol/L (ref 21–32)
Creatinine: 0.62 mg/dL (ref 0.60–1.30)
EGFR (African American): >60
EGFR (Non-African Amer.): >60
Glucose: 124 mg/dL — ABNORMAL HIGH (ref 65–99)
Osmolality: 277 (ref 275–301)
Potassium: 3.4 mmol/L — ABNORMAL LOW (ref 3.5–5.1)
SGOT(AST): 32 U/L (ref 15–37)
SGPT (ALT): 17 U/L (ref 12–78)
Sodium: 138 mmol/L (ref 136–145)
Total Protein: 6.5 g/dL (ref 6.4–8.2)

## 2013-08-16 LAB — DRUG SCREEN, URINE
Amphetamines, Ur Screen: NEGATIVE (ref ?–1000)
Barbiturates, Ur Screen: NEGATIVE (ref ?–200)
Benzodiazepine, Ur Scrn: POSITIVE (ref ?–200)
Cannabinoid 50 Ng, Ur ~~LOC~~: NEGATIVE (ref ?–50)
Cocaine Metabolite,Ur ~~LOC~~: NEGATIVE (ref ?–300)
MDMA (Ecstasy)Ur Screen: POSITIVE (ref ?–500)
Methadone, Ur Screen: NEGATIVE (ref ?–300)
Opiate, Ur Screen: NEGATIVE (ref ?–300)
Phencyclidine (PCP) Ur S: NEGATIVE (ref ?–25)
Tricyclic, Ur Screen: NEGATIVE (ref ?–1000)

## 2013-08-16 LAB — TROPONIN I: Troponin-I: <0.02 ng/mL

## 2013-08-16 LAB — TSH: Thyroid Stimulating Horm: 0.41 u[IU]/mL — ABNORMAL LOW

## 2013-08-16 LAB — PROTIME-INR
INR: 1
Prothrombin Time: 12.9 secs (ref 11.5–14.7)

## 2013-08-16 LAB — CK TOTAL AND CKMB (NOT AT ARMC)
CK, Total: 85 U/L (ref 21–215)
CK-MB: 1.2 ng/mL (ref 0.5–3.6)

## 2013-08-16 LAB — SEDIMENTATION RATE: Erythrocyte Sed Rate: 37 mm/hr — ABNORMAL HIGH (ref 0–30)

## 2013-08-16 LAB — T4, FREE: Free Thyroxine: 0.32 ng/dL — ABNORMAL LOW (ref 0.76–1.46)

## 2013-08-16 LAB — AMMONIA: Ammonia, Plasma: 33 mcmol/L — ABNORMAL HIGH (ref 11–32)

## 2013-08-19 LAB — BASIC METABOLIC PANEL
Anion Gap: 9 (ref 7–16)
BUN: 8 mg/dL (ref 7–18)
Calcium, Total: 8.1 mg/dL — ABNORMAL LOW (ref 8.5–10.1)
Chloride: 108 mmol/L — ABNORMAL HIGH (ref 98–107)
Co2: 25 mmol/L (ref 21–32)
Creatinine: 0.39 mg/dL — ABNORMAL LOW (ref 0.60–1.30)
EGFR (African American): >60
EGFR (Non-African Amer.): >60
Glucose: 98 mg/dL (ref 65–99)
Osmolality: 281 (ref 275–301)
Potassium: 3.6 mmol/L (ref 3.5–5.1)
Sodium: 142 mmol/L (ref 136–145)

## 2013-08-19 LAB — CBC WITH DIFFERENTIAL/PLATELET
Basophil #: 0 10*3/uL (ref 0.0–0.1)
Basophil %: 0.7 %
Eosinophil #: 0.3 10*3/uL (ref 0.0–0.7)
Eosinophil %: 9.1 %
HCT: 28.5 % — ABNORMAL LOW (ref 35.0–47.0)
HGB: 9.2 g/dL — ABNORMAL LOW (ref 12.0–16.0)
Lymphocyte #: 1.1 10*3/uL (ref 1.0–3.6)
Lymphocyte %: 38.3 %
MCH: 33.7 pg (ref 26.0–34.0)
MCHC: 32.5 g/dL (ref 32.0–36.0)
MCV: 104 fL — ABNORMAL HIGH (ref 80–100)
Monocyte #: 0.2 x10 3/mm (ref 0.2–0.9)
Monocyte %: 8 %
Neutrophil #: 1.2 10*3/uL — ABNORMAL LOW (ref 1.4–6.5)
Neutrophil %: 43.9 %
Platelet: 246 10*3/uL (ref 150–440)
RBC: 2.74 10*6/uL — ABNORMAL LOW (ref 3.80–5.20)
RDW: 15.6 % — ABNORMAL HIGH (ref 11.5–14.5)
WBC: 2.8 10*3/uL — ABNORMAL LOW (ref 3.6–11.0)

## 2013-08-19 LAB — PLATELET COUNT: Platelet: 222 10*3/uL (ref 150–440)

## 2013-08-19 LAB — CK: CK, Total: 58 U/L (ref 21–215)

## 2013-08-19 LAB — MAGNESIUM: Magnesium: 1.9 mg/dL

## 2013-08-21 LAB — BASIC METABOLIC PANEL
Anion Gap: 6 — ABNORMAL LOW (ref 7–16)
BUN: 5 mg/dL — ABNORMAL LOW (ref 7–18)
Calcium, Total: 8.1 mg/dL — ABNORMAL LOW (ref 8.5–10.1)
Chloride: 107 mmol/L (ref 98–107)
Co2: 26 mmol/L (ref 21–32)
Creatinine: 0.44 mg/dL — ABNORMAL LOW (ref 0.60–1.30)
EGFR (African American): >60
EGFR (Non-African Amer.): >60
Glucose: 79 mg/dL (ref 65–99)
Osmolality: 274 (ref 275–301)
Potassium: 3.1 mmol/L — ABNORMAL LOW (ref 3.5–5.1)
Sodium: 139 mmol/L (ref 136–145)

## 2013-08-22 LAB — COMPREHENSIVE METABOLIC PANEL
Albumin: 2.5 g/dL — ABNORMAL LOW (ref 3.4–5.0)
Alkaline Phosphatase: 120 U/L — ABNORMAL HIGH
Anion Gap: 6 — ABNORMAL LOW (ref 7–16)
BUN: 4 mg/dL — ABNORMAL LOW (ref 7–18)
Bilirubin,Total: 0.2 mg/dL (ref 0.2–1.0)
Calcium, Total: 8 mg/dL — ABNORMAL LOW (ref 8.5–10.1)
Chloride: 109 mmol/L — ABNORMAL HIGH (ref 98–107)
Co2: 26 mmol/L (ref 21–32)
Creatinine: 0.41 mg/dL — ABNORMAL LOW (ref 0.60–1.30)
EGFR (African American): >60
EGFR (Non-African Amer.): >60
Glucose: 84 mg/dL (ref 65–99)
Osmolality: 277 (ref 275–301)
Potassium: 3.1 mmol/L — ABNORMAL LOW (ref 3.5–5.1)
SGOT(AST): 20 U/L (ref 15–37)
SGPT (ALT): 11 U/L — ABNORMAL LOW (ref 12–78)
Sodium: 141 mmol/L (ref 136–145)
Total Protein: 5.4 g/dL — ABNORMAL LOW (ref 6.4–8.2)

## 2013-08-27 ENCOUNTER — Encounter: Payer: Self-pay | Admitting: Neurology

## 2013-09-05 DIAGNOSIS — M069 Rheumatoid arthritis, unspecified: Secondary | ICD-10-CM | POA: Insufficient documentation

## 2013-09-05 DIAGNOSIS — F319 Bipolar disorder, unspecified: Secondary | ICD-10-CM | POA: Insufficient documentation

## 2013-09-05 DIAGNOSIS — R29898 Other symptoms and signs involving the musculoskeletal system: Secondary | ICD-10-CM | POA: Insufficient documentation

## 2013-09-11 DIAGNOSIS — F1920 Other psychoactive substance dependence, uncomplicated: Secondary | ICD-10-CM | POA: Insufficient documentation

## 2013-09-17 ENCOUNTER — Ambulatory Visit: Payer: Self-pay | Admitting: Hematology and Oncology

## 2013-09-28 ENCOUNTER — Ambulatory Visit: Payer: Self-pay | Admitting: Hematology and Oncology

## 2013-10-16 LAB — CBC CANCER CENTER
Basophil #: 0 x10 3/mm (ref 0.0–0.1)
Basophil %: 0.6 %
Eosinophil #: 0.2 x10 3/mm (ref 0.0–0.7)
Eosinophil %: 4.9 %
HCT: 36.7 % (ref 35.0–47.0)
HGB: 11.8 g/dL — ABNORMAL LOW (ref 12.0–16.0)
Lymphocyte #: 2 x10 3/mm (ref 1.0–3.6)
Lymphocyte %: 52.2 %
MCH: 31.2 pg (ref 26.0–34.0)
MCHC: 32.2 g/dL (ref 32.0–36.0)
MCV: 97 fL (ref 80–100)
Monocyte #: 0.4 x10 3/mm (ref 0.2–0.9)
Monocyte %: 9.8 %
Neutrophil #: 1.3 x10 3/mm — ABNORMAL LOW (ref 1.4–6.5)
Neutrophil %: 32.5 %
Platelet: 225 x10 3/mm (ref 150–440)
RBC: 3.79 10*6/uL — ABNORMAL LOW (ref 3.80–5.20)
RDW: 14.1 % (ref 11.5–14.5)
WBC: 3.9 x10 3/mm (ref 3.6–11.0)

## 2013-10-29 ENCOUNTER — Ambulatory Visit: Payer: Self-pay | Admitting: Hematology and Oncology

## 2013-11-24 DIAGNOSIS — Z9884 Bariatric surgery status: Secondary | ICD-10-CM | POA: Insufficient documentation

## 2013-11-24 DIAGNOSIS — M069 Rheumatoid arthritis, unspecified: Secondary | ICD-10-CM | POA: Insufficient documentation

## 2013-11-24 DIAGNOSIS — F319 Bipolar disorder, unspecified: Secondary | ICD-10-CM | POA: Insufficient documentation

## 2013-11-28 ENCOUNTER — Ambulatory Visit: Payer: Self-pay | Admitting: Hematology and Oncology

## 2013-12-05 ENCOUNTER — Other Ambulatory Visit: Payer: Self-pay

## 2013-12-05 LAB — URIC ACID: Uric Acid: 3.2 mg/dL (ref 2.6–6.0)

## 2013-12-05 LAB — SEDIMENTATION RATE: Erythrocyte Sed Rate: 16 mm/hr (ref 0–30)

## 2013-12-07 LAB — COMPREHENSIVE METABOLIC PANEL
Albumin: 3.3 g/dL — ABNORMAL LOW (ref 3.4–5.0)
Alkaline Phosphatase: 95 U/L
Anion Gap: 6 — ABNORMAL LOW (ref 7–16)
BUN: 10 mg/dL (ref 7–18)
Bilirubin,Total: 0.3 mg/dL (ref 0.2–1.0)
Calcium, Total: 8.6 mg/dL (ref 8.5–10.1)
Chloride: 113 mmol/L — ABNORMAL HIGH (ref 98–107)
Co2: 25 mmol/L (ref 21–32)
Creatinine: 0.77 mg/dL (ref 0.60–1.30)
EGFR (African American): >60
EGFR (Non-African Amer.): >60
Glucose: 72 mg/dL (ref 65–99)
Osmolality: 284 (ref 275–301)
Potassium: 3.6 mmol/L (ref 3.5–5.1)
SGOT(AST): 16 U/L (ref 15–37)
SGPT (ALT): 11 U/L — ABNORMAL LOW (ref 12–78)
Sodium: 144 mmol/L (ref 136–145)
Total Protein: 6.8 g/dL (ref 6.4–8.2)

## 2013-12-07 LAB — URINALYSIS, COMPLETE
Bilirubin,UR: NEGATIVE
Blood: NEGATIVE
Glucose,UR: NEGATIVE mg/dL (ref 0–75)
Ketone: NEGATIVE
Leukocyte Esterase: NEGATIVE
Nitrite: NEGATIVE
Ph: 6 (ref 4.5–8.0)
Protein: NEGATIVE
RBC,UR: <1 /HPF (ref 0–5)
Specific Gravity: 1.019 (ref 1.003–1.030)
Squamous Epithelial: NONE SEEN
WBC UR: 2 /HPF (ref 0–5)

## 2013-12-07 LAB — CBC
HCT: 31.4 % — ABNORMAL LOW (ref 35.0–47.0)
HGB: 10.3 g/dL — ABNORMAL LOW (ref 12.0–16.0)
MCH: 30.3 pg (ref 26.0–34.0)
MCHC: 33 g/dL (ref 32.0–36.0)
MCV: 92 fL (ref 80–100)
Platelet: 246 10*3/uL (ref 150–440)
RBC: 3.42 10*6/uL — ABNORMAL LOW (ref 3.80–5.20)
RDW: 15.3 % — ABNORMAL HIGH (ref 11.5–14.5)
WBC: 3.8 10*3/uL (ref 3.6–11.0)

## 2013-12-07 LAB — SALICYLATE LEVEL: Salicylates, Serum: <1.7 mg/dL

## 2013-12-07 LAB — DRUG SCREEN, URINE
Amphetamines, Ur Screen: NEGATIVE (ref ?–1000)
Barbiturates, Ur Screen: NEGATIVE (ref ?–200)
Benzodiazepine, Ur Scrn: POSITIVE (ref ?–200)
Cannabinoid 50 Ng, Ur ~~LOC~~: NEGATIVE (ref ?–50)
Cocaine Metabolite,Ur ~~LOC~~: NEGATIVE (ref ?–300)
MDMA (Ecstasy)Ur Screen: POSITIVE (ref ?–500)
Methadone, Ur Screen: NEGATIVE (ref ?–300)
Opiate, Ur Screen: NEGATIVE (ref ?–300)
Phencyclidine (PCP) Ur S: NEGATIVE (ref ?–25)
Tricyclic, Ur Screen: NEGATIVE (ref ?–1000)

## 2013-12-07 LAB — ACETAMINOPHEN LEVEL: Acetaminophen: 2 ug/mL — ABNORMAL LOW

## 2013-12-07 LAB — ETHANOL
Ethanol %: <0.003 % (ref 0.000–0.080)
Ethanol: <3 mg/dL

## 2013-12-08 ENCOUNTER — Inpatient Hospital Stay: Payer: Self-pay | Admitting: Psychiatry

## 2014-01-06 ENCOUNTER — Ambulatory Visit: Payer: Self-pay

## 2014-01-12 DIAGNOSIS — D649 Anemia, unspecified: Secondary | ICD-10-CM | POA: Diagnosis not present

## 2014-01-15 ENCOUNTER — Ambulatory Visit: Payer: Self-pay | Admitting: Hematology and Oncology

## 2014-01-15 LAB — CBC CANCER CENTER
Basophil #: 0 x10 3/mm (ref 0.0–0.1)
Basophil %: 0.4 %
Eosinophil #: 0.1 x10 3/mm (ref 0.0–0.7)
Eosinophil %: 2.7 %
HCT: 30.7 % — ABNORMAL LOW (ref 35.0–47.0)
HGB: 9.8 g/dL — ABNORMAL LOW (ref 12.0–16.0)
Lymphocyte #: 1.7 x10 3/mm (ref 1.0–3.6)
Lymphocyte %: 59.7 %
MCH: 28.6 pg (ref 26.0–34.0)
MCHC: 32 g/dL (ref 32.0–36.0)
MCV: 90 fL (ref 80–100)
Monocyte #: 0.4 x10 3/mm (ref 0.2–0.9)
Monocyte %: 13 %
Neutrophil #: 0.7 x10 3/mm — ABNORMAL LOW (ref 1.4–6.5)
Neutrophil %: 24.2 %
Platelet: 216 x10 3/mm (ref 150–440)
RBC: 3.42 10*6/uL — ABNORMAL LOW (ref 3.80–5.20)
RDW: 16.7 % — ABNORMAL HIGH (ref 11.5–14.5)
WBC: 2.8 x10 3/mm — ABNORMAL LOW (ref 3.6–11.0)

## 2014-01-19 DIAGNOSIS — K7460 Unspecified cirrhosis of liver: Secondary | ICD-10-CM | POA: Diagnosis not present

## 2014-01-19 DIAGNOSIS — Z9884 Bariatric surgery status: Secondary | ICD-10-CM | POA: Diagnosis not present

## 2014-01-19 DIAGNOSIS — F4489 Other dissociative and conversion disorders: Secondary | ICD-10-CM | POA: Diagnosis not present

## 2014-01-19 DIAGNOSIS — E7889 Other lipoprotein metabolism disorders: Secondary | ICD-10-CM | POA: Diagnosis not present

## 2014-01-19 DIAGNOSIS — I959 Hypotension, unspecified: Secondary | ICD-10-CM | POA: Diagnosis not present

## 2014-01-21 DIAGNOSIS — D649 Anemia, unspecified: Secondary | ICD-10-CM | POA: Diagnosis not present

## 2014-01-22 LAB — COMPREHENSIVE METABOLIC PANEL
Albumin: 3.6 g/dL (ref 3.4–5.0)
Alkaline Phosphatase: 90 U/L
Anion Gap: 5 — ABNORMAL LOW (ref 7–16)
BUN: 10 mg/dL (ref 7–18)
Bilirubin,Total: 0.3 mg/dL (ref 0.2–1.0)
Calcium, Total: 8.4 mg/dL — ABNORMAL LOW (ref 8.5–10.1)
Chloride: 112 mmol/L — ABNORMAL HIGH (ref 98–107)
Co2: 24 mmol/L (ref 21–32)
Creatinine: 0.86 mg/dL (ref 0.60–1.30)
EGFR (African American): >60
EGFR (Non-African Amer.): >60
Glucose: 89 mg/dL (ref 65–99)
Osmolality: 280 (ref 275–301)
Potassium: 4 mmol/L (ref 3.5–5.1)
SGOT(AST): 26 U/L (ref 15–37)
SGPT (ALT): 19 U/L (ref 12–78)
Sodium: 141 mmol/L (ref 136–145)
Total Protein: 7 g/dL (ref 6.4–8.2)

## 2014-01-22 LAB — CBC
HCT: 31.7 % — ABNORMAL LOW (ref 35.0–47.0)
HGB: 10.2 g/dL — ABNORMAL LOW (ref 12.0–16.0)
MCH: 28.7 pg (ref 26.0–34.0)
MCHC: 32.3 g/dL (ref 32.0–36.0)
MCV: 89 fL (ref 80–100)
Platelet: 263 10*3/uL (ref 150–440)
RBC: 3.56 10*6/uL — ABNORMAL LOW (ref 3.80–5.20)
RDW: 16.9 % — ABNORMAL HIGH (ref 11.5–14.5)
WBC: 5.1 10*3/uL (ref 3.6–11.0)

## 2014-01-22 LAB — ACETAMINOPHEN LEVEL: Acetaminophen: <2 ug/mL

## 2014-01-22 LAB — ETHANOL
Ethanol %: <0.003 % (ref 0.000–0.080)
Ethanol: <3 mg/dL

## 2014-01-22 LAB — SALICYLATE LEVEL: Salicylates, Serum: <1.7 mg/dL

## 2014-01-23 ENCOUNTER — Inpatient Hospital Stay: Payer: Self-pay | Admitting: Psychiatry

## 2014-01-24 LAB — URINALYSIS, COMPLETE
Bacteria: NONE SEEN
Bilirubin,UR: NEGATIVE
Blood: NEGATIVE
Glucose,UR: NEGATIVE mg/dL (ref 0–75)
Hyaline Cast: 2
Ketone: NEGATIVE
Leukocyte Esterase: NEGATIVE
Nitrite: NEGATIVE
Ph: 6 (ref 4.5–8.0)
Protein: NEGATIVE
RBC,UR: <1 /HPF (ref 0–5)
Specific Gravity: 1.009 (ref 1.003–1.030)
Squamous Epithelial: NONE SEEN
WBC UR: 2 /HPF (ref 0–5)

## 2014-01-24 LAB — DRUG SCREEN, URINE

## 2014-01-27 ENCOUNTER — Inpatient Hospital Stay: Payer: Self-pay | Admitting: Internal Medicine

## 2014-01-27 LAB — CBC WITH DIFFERENTIAL/PLATELET
Basophil #: 0 10*3/uL (ref 0.0–0.1)
Basophil %: 0.7 %
Eosinophil #: 0.1 10*3/uL (ref 0.0–0.7)
Eosinophil %: 3.3 %
HCT: 29.4 % — ABNORMAL LOW (ref 35.0–47.0)
HGB: 9.6 g/dL — ABNORMAL LOW (ref 12.0–16.0)
Lymphocyte #: 1.8 10*3/uL (ref 1.0–3.6)
Lymphocyte %: 42.1 %
MCH: 28.4 pg (ref 26.0–34.0)
MCHC: 32.6 g/dL (ref 32.0–36.0)
MCV: 87 fL (ref 80–100)
Monocyte #: 0.6 x10 3/mm (ref 0.2–0.9)
Monocyte %: 14.8 %
Neutrophil #: 1.7 10*3/uL (ref 1.4–6.5)
Neutrophil %: 39.1 %
Platelet: 239 10*3/uL (ref 150–440)
RBC: 3.37 10*6/uL — ABNORMAL LOW (ref 3.80–5.20)
RDW: 16.3 % — ABNORMAL HIGH (ref 11.5–14.5)
WBC: 4.4 10*3/uL (ref 3.6–11.0)

## 2014-01-27 LAB — AMMONIA: Ammonia, Plasma: 18 mcmol/L (ref 11–32)

## 2014-01-28 ENCOUNTER — Ambulatory Visit: Payer: Self-pay | Admitting: Hematology and Oncology

## 2014-01-28 DIAGNOSIS — M069 Rheumatoid arthritis, unspecified: Secondary | ICD-10-CM | POA: Diagnosis not present

## 2014-01-28 DIAGNOSIS — B028 Zoster with other complications: Secondary | ICD-10-CM | POA: Diagnosis not present

## 2014-01-28 LAB — CBC WITH DIFFERENTIAL/PLATELET
Basophil #: 0 10*3/uL (ref 0.0–0.1)
Basophil %: 0.8 %
Eosinophil #: 0.1 10*3/uL (ref 0.0–0.7)
Eosinophil %: 4.4 %
HCT: 30.4 % — ABNORMAL LOW (ref 35.0–47.0)
HGB: 9.8 g/dL — ABNORMAL LOW (ref 12.0–16.0)
Lymphocyte #: 1.6 10*3/uL (ref 1.0–3.6)
Lymphocyte %: 54.9 %
MCH: 28.2 pg (ref 26.0–34.0)
MCHC: 32.2 g/dL (ref 32.0–36.0)
MCV: 87 fL (ref 80–100)
Monocyte #: 0.4 x10 3/mm (ref 0.2–0.9)
Monocyte %: 14 %
Neutrophil #: 0.8 10*3/uL — ABNORMAL LOW (ref 1.4–6.5)
Neutrophil %: 25.9 %
Platelet: 226 10*3/uL (ref 150–440)
RBC: 3.48 10*6/uL — ABNORMAL LOW (ref 3.80–5.20)
RDW: 16.5 % — ABNORMAL HIGH (ref 11.5–14.5)
WBC: 2.9 10*3/uL — ABNORMAL LOW (ref 3.6–11.0)

## 2014-01-28 LAB — BASIC METABOLIC PANEL
Anion Gap: 8 (ref 7–16)
BUN: 8 mg/dL (ref 7–18)
Calcium, Total: 8.8 mg/dL (ref 8.5–10.1)
Chloride: 112 mmol/L — ABNORMAL HIGH (ref 98–107)
Co2: 27 mmol/L (ref 21–32)
Creatinine: 0.71 mg/dL (ref 0.60–1.30)
EGFR (African American): >60
EGFR (Non-African Amer.): >60
Glucose: 85 mg/dL (ref 65–99)
Osmolality: 290 (ref 275–301)
Potassium: 3.7 mmol/L (ref 3.5–5.1)
Sodium: 147 mmol/L — ABNORMAL HIGH (ref 136–145)

## 2014-01-28 LAB — TSH: Thyroid Stimulating Horm: 0.354 u[IU]/mL — ABNORMAL LOW

## 2014-01-28 LAB — T4, FREE: Free Thyroxine: 0.98 ng/dL (ref 0.76–1.46)

## 2014-01-28 LAB — MAGNESIUM: Magnesium: 1.9 mg/dL

## 2014-02-09 ENCOUNTER — Other Ambulatory Visit (HOSPITAL_COMMUNITY): Payer: 59 | Attending: Psychiatry

## 2014-02-09 ENCOUNTER — Encounter (HOSPITAL_COMMUNITY): Payer: Self-pay

## 2014-02-09 DIAGNOSIS — Z609 Problem related to social environment, unspecified: Secondary | ICD-10-CM | POA: Insufficient documentation

## 2014-02-09 DIAGNOSIS — F411 Generalized anxiety disorder: Secondary | ICD-10-CM | POA: Insufficient documentation

## 2014-02-09 DIAGNOSIS — M810 Age-related osteoporosis without current pathological fracture: Secondary | ICD-10-CM | POA: Insufficient documentation

## 2014-02-09 DIAGNOSIS — F314 Bipolar disorder, current episode depressed, severe, without psychotic features: Secondary | ICD-10-CM

## 2014-02-09 DIAGNOSIS — D649 Anemia, unspecified: Secondary | ICD-10-CM | POA: Insufficient documentation

## 2014-02-09 DIAGNOSIS — M069 Rheumatoid arthritis, unspecified: Secondary | ICD-10-CM | POA: Insufficient documentation

## 2014-02-09 DIAGNOSIS — F410 Panic disorder [episodic paroxysmal anxiety] without agoraphobia: Secondary | ICD-10-CM | POA: Insufficient documentation

## 2014-02-09 DIAGNOSIS — E079 Disorder of thyroid, unspecified: Secondary | ICD-10-CM | POA: Insufficient documentation

## 2014-02-09 DIAGNOSIS — Z9884 Bariatric surgery status: Secondary | ICD-10-CM | POA: Insufficient documentation

## 2014-02-09 DIAGNOSIS — F3130 Bipolar disorder, current episode depressed, mild or moderate severity, unspecified: Secondary | ICD-10-CM | POA: Insufficient documentation

## 2014-02-09 DIAGNOSIS — M1990 Unspecified osteoarthritis, unspecified site: Secondary | ICD-10-CM | POA: Insufficient documentation
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
## 2014-02-10 ENCOUNTER — Other Ambulatory Visit (HOSPITAL_COMMUNITY): Payer: 59 | Admitting: Psychiatry

## 2014-02-10 ENCOUNTER — Encounter (HOSPITAL_COMMUNITY): Payer: Self-pay | Admitting: Psychiatry

## 2014-02-10 DIAGNOSIS — F314 Bipolar disorder, current episode depressed, severe, without psychotic features: Secondary | ICD-10-CM
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
## 2014-02-10 NOTE — Progress Notes (Signed)
    Daily Group Progress Note  Program: IOP  Group Time: 9:00-10:30  Participation Level: Active  Behavioral Response: Appropriate  Type of Therapy:  Group Therapy  Summary of Progress: Pt. Presents as talkative, makes appropriate eye contact. Pt. Discussed multiple physical health conditions including rheumatoid arthritis, osteoporosis, hypothyroid, low blood pressure, and low blood sugar. Pt. Is also a gastric bypass patient with a brief history of alcohol dependence. Pt. Discussed history of post-partum depression and panic attacks. Pt. 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 Pt. Has supportive husband and son.     Group Time: 10:30-12:00  Participation Level:  Active  Behavioral Response: Appropriate  Type of Therapy: Psycho-education Group  Summary of Progress: met with case manager  Nancie Neas, COUNS

## 2014-02-11 ENCOUNTER — Other Ambulatory Visit (HOSPITAL_COMMUNITY): Payer: 59 | Admitting: Psychiatry

## 2014-02-11 ENCOUNTER — Encounter (HOSPITAL_COMMUNITY): Payer: Self-pay | Admitting: Psychiatry

## 2014-02-11 NOTE — Progress Notes (Signed)
    Daily Group Progress Note  Program: IOP  Group Time: 9:00-10:30  Participation Level: Active  Behavioral Response: Appropriate  Type of Therapy:  Psycho-education Group  Summary of Progress: Pt. Participated in grief and loss group facilitated by Jeanella Craze.     Group Time: 10:30-12:00  Participation Level:  Active  Behavioral Response: Appropriate  Type of Therapy: Group Therapy  Summary of Progress: Pt. Discussed absence of emotional support and companionship from husband, loss of friendship and interpersonal connection due to loss of employment as a Pharmacist, hospital. Pt. Discussed history of emotional eating and learning to eat mindfully as a result of gastric bypass and 150 pound weight loss.  Nancie Neas, COUNS

## 2014-02-12 ENCOUNTER — Encounter (HOSPITAL_COMMUNITY): Payer: Self-pay | Admitting: Psychiatry

## 2014-02-12 ENCOUNTER — Other Ambulatory Visit (HOSPITAL_COMMUNITY): Payer: 59 | Admitting: Psychiatry

## 2014-02-12 DIAGNOSIS — F314 Bipolar disorder, current episode depressed, severe, without psychotic features: Secondary | ICD-10-CM

## 2014-02-12 NOTE — Progress Notes (Signed)
    Daily Group Progress Note  Program: IOP  Group Time: 9:00-10;30  Participation Level: Active  Behavioral Response: Appropriate  Type of Therapy:  Group Therapy  Summary of Progress: Pt. Shared that she had a mild headache and slept only 3 hours last night. Pt. Shared challenge of self-harming behavior and not feeling loved and supported in marital relationship.     Group Time: 10:30-12:00  Participation Level:  Active  Behavioral Response: Appropriate  Type of Therapy: Psycho-education Group  Summary of Progress: Pt. Participated in discussion facilitated by the mental health association.  Nancie Neas, COUNS

## 2014-02-13 ENCOUNTER — Other Ambulatory Visit (HOSPITAL_COMMUNITY): Payer: 59

## 2014-02-13 NOTE — Progress Notes (Signed)
Patient ID: Jamie Bennett, female   DOB: 05-Apr-1961, 53 y.o.   MRN: [id number] Pt seen states shes feeling better , likes group has been talking in groups , and states that at home she feels lonely has no friends , so discussed joiing a book club and getting involved in church activities , pt stated understanding,  Sleep, app, mood-good, no suicidal/ homicidal ideation, no hallucinations / delusion. Tol meds well.

## 2014-02-13 NOTE — Progress Notes (Signed)
    Daily Group Progress Note  Program: IOP  Group Time: 7579-7282  Participation Level: Active  Behavioral Response: Sharing, Monopolizing and Motivated  Type of Therapy:  Process Group  Summary of Progress:  Patient discussed how she has realized that she needs more structure and how she needs to practice self care techniques even if it means without husband participating.  "I think we feed into eachother's illnesses.  Pt has an upcoming concert in which her brother will be joining her for.  States she is looking forward to that.     Group Time: 1045-1200  Participation Level:  Active  Behavioral Response: Appropriate  Type of Therapy: Psycho-education Group  Summary of Progress: Went for a walk at a nearby park. Learned breath work, guided imagery and progressive relaxation techniques. Practiced ways to breathe, tense and relax muscles through guiding the mind and body connection.

## 2014-02-16 ENCOUNTER — Other Ambulatory Visit (HOSPITAL_COMMUNITY): Payer: 59 | Admitting: Psychiatry

## 2014-02-16 ENCOUNTER — Encounter (HOSPITAL_COMMUNITY): Payer: Self-pay | Admitting: Psychiatry

## 2014-02-16 DIAGNOSIS — F314 Bipolar disorder, current episode depressed, severe, without psychotic features: Secondary | ICD-10-CM

## 2014-02-16 NOTE — Progress Notes (Signed)
    Daily Group Progress Note  Program: IOP  Group Time: 9:00-10:30  Participation Level: Active  Behavioral Response: Appropriate  Type of Therapy:  Group Therapy  Summary of Progress: Pt. Reports that she continues to have "ups and downs". Pt. Reports that she was focused on Dr. Dimitri Ped suggestions for becoming more active by finding a church fellowship, renewing her interest in gardening, and finding a book club. Pt. Reported that she felt very overwhelmed and tired because she tried to do too much over the weekend.     Group Time: 10:30-12:00  Participation Level:  Active  Behavioral Response: Appropriate  Type of Therapy: Psycho-education Group  Summary of Progress: Pt. Participated in grief and loss group facilitated by Jeanella Craze.  Nancie Neas, COUNS

## 2014-02-17 ENCOUNTER — Other Ambulatory Visit (HOSPITAL_COMMUNITY): Payer: 59 | Admitting: Psychiatry

## 2014-02-17 ENCOUNTER — Encounter (HOSPITAL_COMMUNITY): Payer: Self-pay | Admitting: Psychiatry

## 2014-02-17 DIAGNOSIS — K7682 Hepatic encephalopathy: Secondary | ICD-10-CM | POA: Diagnosis not present

## 2014-02-17 DIAGNOSIS — F319 Bipolar disorder, unspecified: Secondary | ICD-10-CM | POA: Diagnosis not present

## 2014-02-17 DIAGNOSIS — M069 Rheumatoid arthritis, unspecified: Secondary | ICD-10-CM | POA: Diagnosis not present

## 2014-02-17 DIAGNOSIS — K7290 Hepatic failure, unspecified without coma: Secondary | ICD-10-CM | POA: Diagnosis not present

## 2014-02-17 DIAGNOSIS — F314 Bipolar disorder, current episode depressed, severe, without psychotic features: Secondary | ICD-10-CM

## 2014-02-17 NOTE — Progress Notes (Signed)
    Daily Group Progress Note  Program: IOP  Group Time: 9:00-10:30  Participation Level: Active  Behavioral Response: Appropriate  Type of Therapy:  Group Therapy  Summary of Progress: Pt. Reported that she had an anxiety attack yesterday but was able to calm herself using the breathing exercises and taking medication. Processed emotional relationship with food.     Group Time: 10:30-12:00  Participation Level:  Active  Behavioral Response: Appropriate  Type of Therapy: Psycho-education Group  Summary of Progress: Discussed shame resilience; watched [employer] video.  Nancie Neas, COUNS

## 2014-02-18 ENCOUNTER — Other Ambulatory Visit (HOSPITAL_COMMUNITY): Payer: 59 | Admitting: Psychiatry

## 2014-02-18 ENCOUNTER — Encounter (HOSPITAL_COMMUNITY): Payer: Self-pay | Admitting: Psychiatry

## 2014-02-18 DIAGNOSIS — F314 Bipolar disorder, current episode depressed, severe, without psychotic features: Secondary | ICD-10-CM

## 2014-02-18 NOTE — Progress Notes (Signed)
    Daily Group Progress Note  Program: IOP  Group Time: 9:00-10:30  Participation Level: Active  Behavioral Response: Appropriate  Type of Therapy:  Group Therapy  Summary of Progress: Pt. Appeared sleepy and lethargic. Pt. Continues to process relationship with husband and pattern of controlling and compliant behavior in the relationship.     Group Time: 10:30-12:00  Participation Level:  Active  Behavioral Response: Appropriate  Type of Therapy: Psycho-education Group  Summary of Progress: Continued discussion about shame resilience and developing appropriate vulnerability in relationships.  Nancie Neas, COUNS

## 2014-02-19 ENCOUNTER — Encounter (HOSPITAL_COMMUNITY): Payer: Self-pay | Admitting: Psychiatry

## 2014-02-19 ENCOUNTER — Other Ambulatory Visit (HOSPITAL_COMMUNITY): Payer: 59 | Admitting: Psychiatry

## 2014-02-19 NOTE — Progress Notes (Signed)
    Daily Group Progress Note  Program: IOP  Group Time: 9:00-10:30  Participation Level: Active  Behavioral Response: Appropriate  Type of Therapy:  Group Therapy  Summary of Progress: Pt. Shared that she slept well last night. Pt. Reported stress related to marital relationship. Session focused on identifying strengths for managing stress such as humor and use of positive reinforcement.     Group Time: 10:30-12:00  Participation Level:  Active  Behavioral Response: Appropriate  Type of Therapy: Psycho-education Group  Summary of Progress: Pt. Participated in discussion about the value of self-acceptance.  Nancie Neas, COUNS

## 2014-02-20 ENCOUNTER — Encounter (HOSPITAL_COMMUNITY): Payer: Self-pay | Admitting: Psychiatry

## 2014-02-20 ENCOUNTER — Other Ambulatory Visit (HOSPITAL_COMMUNITY): Payer: 59 | Admitting: Psychiatry

## 2014-02-20 DIAGNOSIS — F314 Bipolar disorder, current episode depressed, severe, without psychotic features: Secondary | ICD-10-CM

## 2014-02-20 NOTE — Progress Notes (Signed)
    Daily Group Progress Note  Program: IOP  Group Time: 9:00-10:30  Participation Level: Active  Behavioral Response: Appropriate  Type of Therapy:  Group Therapy  Summary of Progress: Pt. Less lethargic and sleepy than yesterday, reported that mood was "ok". Pt. Continuing to work on feelings related to self-doubt and loss of self-confidence.     Group Time: 10:30-12:00  Participation Level:  Active  Behavioral Response: Appropriate  Type of Therapy: Psycho-education Group  Summary of Progress: Pt. Participated in discussion about self-compassion; watched Marilynn Latino video.  Nancie Neas, COUNS

## 2014-02-23 ENCOUNTER — Other Ambulatory Visit (HOSPITAL_COMMUNITY): Payer: 59 | Admitting: Psychiatry

## 2014-02-23 ENCOUNTER — Encounter (HOSPITAL_COMMUNITY): Payer: Self-pay | Admitting: Psychiatry

## 2014-02-23 DIAGNOSIS — F314 Bipolar disorder, current episode depressed, severe, without psychotic features: Secondary | ICD-10-CM

## 2014-02-23 NOTE — Progress Notes (Signed)
    Daily Group Progress Note  Program: IOP  Group Time: 9:00-10:30  Participation Level: Active  Behavioral Response: Appropriate  Type of Therapy:  Group Therapy  Summary of Progress: Pt. Reported that she planted small garden over the weekend. Processed connection between engagement in meaningful activity and brightened mood. Pt. Continues to process relationship with husband which is significant stressor.     Group Time: 10:30-12:00  Participation Level:  Active  Behavioral Response: Appropriate  Type of Therapy: Psycho-education Group  Summary of Progress: Pt. Participated in grief and loss group facilitated by Jeanella Craze.  Nancie Neas, COUNS

## 2014-02-24 ENCOUNTER — Encounter (HOSPITAL_COMMUNITY): Payer: Self-pay | Admitting: Psychiatry

## 2014-02-24 ENCOUNTER — Other Ambulatory Visit (HOSPITAL_COMMUNITY): Payer: 59 | Admitting: Psychiatry

## 2014-02-24 DIAGNOSIS — F314 Bipolar disorder, current episode depressed, severe, without psychotic features: Secondary | ICD-10-CM

## 2014-02-24 NOTE — Progress Notes (Signed)
    Daily Group Progress Note  Program: IOP  Group Time: 9:00-10:30  Participation Level: Active  Behavioral Response: Appropriate  Type of Therapy:  Group Therapy  Summary of Progress: Pt. Reports that she is making progress with her mood and is having a "good day". Pt. Reports that she is sleeping better. Pt. Reports that she is happy with the progress that she is making in her garden and looking forward to sitting and looking at her flowers.     Group Time: 10:30-12:00  Participation Level:  Active  Behavioral Response: Appropriate  Type of Therapy: Psycho-education Group  Summary of Progress: Pt. Participated in discussion about changing physical stance to affect mood; watched Amy Cuddy video.  Nancie Neas, COUNS

## 2014-02-25 ENCOUNTER — Encounter (HOSPITAL_COMMUNITY): Payer: Self-pay | Admitting: Psychiatry

## 2014-02-25 ENCOUNTER — Other Ambulatory Visit (HOSPITAL_COMMUNITY): Payer: 59 | Admitting: Psychiatry

## 2014-02-25 DIAGNOSIS — F314 Bipolar disorder, current episode depressed, severe, without psychotic features: Secondary | ICD-10-CM

## 2014-02-25 NOTE — Progress Notes (Signed)
    Daily Group Progress Note  Program: IOP  Group Time: 9:00-10:30  Participation Level: Active  Behavioral Response: Appropriate  Type of Therapy:  Group Therapy  Summary of Progress: Pt. Reports improvement in mood and motivation to participate in leisure activities. Pt. Processed need for assertiveness and healthy boundaries in marital relationship.     Group Time: 10:30-12:00  Participation Level:  Active  Behavioral Response: Appropriate  Type of Therapy: Psycho-education Group  Summary of Progress: Pt. Participated in discussion about identifying and articulating healthy boundaries.  Nancie Neas, COUNS

## 2014-02-26 ENCOUNTER — Other Ambulatory Visit (HOSPITAL_COMMUNITY): Payer: 59

## 2014-02-26 ENCOUNTER — Emergency Department (HOSPITAL_COMMUNITY)
Admission: EM | Admit: 2014-02-26 | Discharge: 2014-02-26 | Disposition: A | Discharge status: Home / Self Care | Payer: Commercial Indemnity | Attending: Emergency Medicine | Admitting: Emergency Medicine

## 2014-02-26 ENCOUNTER — Encounter (HOSPITAL_COMMUNITY): Payer: Self-pay | Admitting: General Practice

## 2014-02-26 ENCOUNTER — Inpatient Hospital Stay (HOSPITAL_COMMUNITY)
Admission: AD | Admit: 2014-02-26 | Discharge: 2014-03-04 | DRG: 885 | Disposition: A | Discharge status: Home / Self Care | Payer: 59 | Source: Intra-hospital | Attending: Psychiatry | Admitting: Psychiatry

## 2014-02-26 ENCOUNTER — Encounter (HOSPITAL_COMMUNITY): Payer: Self-pay | Admitting: Emergency Medicine

## 2014-02-26 DIAGNOSIS — T424X4A Poisoning by benzodiazepines, undetermined, initial encounter: Secondary | ICD-10-CM | POA: Insufficient documentation

## 2014-02-26 DIAGNOSIS — Z79899 Other long term (current) drug therapy: Secondary | ICD-10-CM | POA: Diagnosis not present

## 2014-02-26 DIAGNOSIS — Z818 Family history of other mental and behavioral disorders: Secondary | ICD-10-CM

## 2014-02-26 DIAGNOSIS — F3130 Bipolar disorder, current episode depressed, mild or moderate severity, unspecified: Principal | ICD-10-CM | POA: Diagnosis present

## 2014-02-26 DIAGNOSIS — Z609 Problem related to social environment, unspecified: Secondary | ICD-10-CM

## 2014-02-26 DIAGNOSIS — R630 Anorexia: Secondary | ICD-10-CM | POA: Diagnosis not present

## 2014-02-26 DIAGNOSIS — K769 Liver disease, unspecified: Secondary | ICD-10-CM | POA: Diagnosis not present

## 2014-02-26 DIAGNOSIS — F319 Bipolar disorder, unspecified: Secondary | ICD-10-CM | POA: Diagnosis not present

## 2014-02-26 DIAGNOSIS — Z8639 Personal history of other endocrine, nutritional and metabolic disease: Secondary | ICD-10-CM | POA: Insufficient documentation

## 2014-02-26 DIAGNOSIS — Z9089 Acquired absence of other organs: Secondary | ICD-10-CM | POA: Diagnosis not present

## 2014-02-26 DIAGNOSIS — F1210 Cannabis abuse, uncomplicated: Secondary | ICD-10-CM | POA: Insufficient documentation

## 2014-02-26 DIAGNOSIS — M1612 Unilateral primary osteoarthritis, left hip: Secondary | ICD-10-CM

## 2014-02-26 DIAGNOSIS — E039 Hypothyroidism, unspecified: Secondary | ICD-10-CM

## 2014-02-26 DIAGNOSIS — F332 Major depressive disorder, recurrent severe without psychotic features: Secondary | ICD-10-CM

## 2014-02-26 DIAGNOSIS — F4325 Adjustment disorder with mixed disturbance of emotions and conduct: Secondary | ICD-10-CM | POA: Diagnosis present

## 2014-02-26 DIAGNOSIS — Z8679 Personal history of other diseases of the circulatory system: Secondary | ICD-10-CM | POA: Insufficient documentation

## 2014-02-26 DIAGNOSIS — M1990 Unspecified osteoarthritis, unspecified site: Secondary | ICD-10-CM | POA: Diagnosis not present

## 2014-02-26 DIAGNOSIS — M0520 Rheumatoid vasculitis with rheumatoid arthritis of unspecified site: Secondary | ICD-10-CM | POA: Diagnosis present

## 2014-02-26 DIAGNOSIS — M810 Age-related osteoporosis without current pathological fracture: Secondary | ICD-10-CM | POA: Diagnosis present

## 2014-02-26 DIAGNOSIS — D72819 Decreased white blood cell count, unspecified: Secondary | ICD-10-CM | POA: Diagnosis not present

## 2014-02-26 DIAGNOSIS — G4700 Insomnia, unspecified: Secondary | ICD-10-CM | POA: Diagnosis present

## 2014-02-26 DIAGNOSIS — Z9884 Bariatric surgery status: Secondary | ICD-10-CM

## 2014-02-26 DIAGNOSIS — T438X2A Poisoning by other psychotropic drugs, intentional self-harm, initial encounter: Secondary | ICD-10-CM | POA: Insufficient documentation

## 2014-02-26 DIAGNOSIS — M899 Disorder of bone, unspecified: Secondary | ICD-10-CM | POA: Insufficient documentation

## 2014-02-26 DIAGNOSIS — R45851 Suicidal ideations: Secondary | ICD-10-CM

## 2014-02-26 DIAGNOSIS — M169 Osteoarthritis of hip, unspecified: Secondary | ICD-10-CM | POA: Diagnosis present

## 2014-02-26 DIAGNOSIS — M1610 Unilateral primary osteoarthritis, unspecified hip: Secondary | ICD-10-CM | POA: Diagnosis present

## 2014-02-26 DIAGNOSIS — M949 Disorder of cartilage, unspecified: Secondary | ICD-10-CM

## 2014-02-26 DIAGNOSIS — E538 Deficiency of other specified B group vitamins: Secondary | ICD-10-CM | POA: Diagnosis not present

## 2014-02-26 DIAGNOSIS — M549 Dorsalgia, unspecified: Secondary | ICD-10-CM | POA: Diagnosis not present

## 2014-02-26 DIAGNOSIS — R1110 Vomiting, unspecified: Secondary | ICD-10-CM | POA: Diagnosis not present

## 2014-02-26 DIAGNOSIS — F411 Generalized anxiety disorder: Secondary | ICD-10-CM | POA: Diagnosis present

## 2014-02-26 DIAGNOSIS — M069 Rheumatoid arthritis, unspecified: Secondary | ICD-10-CM | POA: Diagnosis not present

## 2014-02-26 DIAGNOSIS — K219 Gastro-esophageal reflux disease without esophagitis: Secondary | ICD-10-CM | POA: Diagnosis present

## 2014-02-26 DIAGNOSIS — D649 Anemia, unspecified: Secondary | ICD-10-CM | POA: Diagnosis present

## 2014-02-26 DIAGNOSIS — E079 Disorder of thyroid, unspecified: Secondary | ICD-10-CM | POA: Diagnosis not present

## 2014-02-26 DIAGNOSIS — T43502A Poisoning by unspecified antipsychotics and neuroleptics, intentional self-harm, initial encounter: Secondary | ICD-10-CM | POA: Insufficient documentation

## 2014-02-26 DIAGNOSIS — F410 Panic disorder [episodic paroxysmal anxiety] without agoraphobia: Secondary | ICD-10-CM | POA: Diagnosis not present

## 2014-02-26 DIAGNOSIS — R5381 Other malaise: Secondary | ICD-10-CM | POA: Diagnosis not present

## 2014-02-26 DIAGNOSIS — Z862 Personal history of diseases of the blood and blood-forming organs and certain disorders involving the immune mechanism: Secondary | ICD-10-CM | POA: Insufficient documentation

## 2014-02-26 DIAGNOSIS — F341 Dysthymic disorder: Secondary | ICD-10-CM | POA: Diagnosis not present

## 2014-02-26 DIAGNOSIS — T50902A Poisoning by unspecified drugs, medicaments and biological substances, intentional self-harm, initial encounter: Secondary | ICD-10-CM

## 2014-02-26 DIAGNOSIS — M129 Arthropathy, unspecified: Secondary | ICD-10-CM | POA: Diagnosis not present

## 2014-02-26 LAB — CBC
HCT: 29.2 % — ABNORMAL LOW (ref 36.0–46.0)
Hemoglobin: 9.2 g/dL — ABNORMAL LOW (ref 12.0–15.0)
MCH: 26.8 pg (ref 26.0–34.0)
MCHC: 31.5 g/dL (ref 30.0–36.0)
MCV: 85.1 fL (ref 78.0–100.0)
Platelets: 204 10*3/uL (ref 150–400)
RBC: 3.43 MIL/uL — ABNORMAL LOW (ref 3.87–5.11)
RDW: 14.7 % (ref 11.5–15.5)
WBC: 4.6 10*3/uL (ref 4.0–10.5)

## 2014-02-26 LAB — RAPID URINE DRUG SCREEN, HOSP PERFORMED
Amphetamines: NOT DETECTED
Barbiturates: NOT DETECTED
Benzodiazepines: POSITIVE — AB
Cocaine: NOT DETECTED
Opiates: NOT DETECTED
Tetrahydrocannabinol: NOT DETECTED

## 2014-02-26 LAB — COMPREHENSIVE METABOLIC PANEL
ALT: 9 U/L (ref 0–35)
AST: 17 U/L (ref 0–37)
Albumin: 3.3 g/dL — ABNORMAL LOW (ref 3.5–5.2)
Alkaline Phosphatase: 123 U/L — ABNORMAL HIGH (ref 39–117)
Anion gap: 13 (ref 5–15)
BUN: 12 mg/dL (ref 6–23)
CO2: 22 mEq/L (ref 19–32)
Calcium: 9 mg/dL (ref 8.4–10.5)
Chloride: 105 mEq/L (ref 96–112)
Creatinine, Ser: 0.58 mg/dL (ref 0.50–1.10)
GFR calc Af Amer: >90 mL/min (ref >90)
GFR calc non Af Amer: >90 mL/min (ref >90)
Glucose, Bld: 93 mg/dL (ref 70–99)
Potassium: 4.2 mEq/L (ref 3.7–5.3)
Sodium: 140 mEq/L (ref 137–147)
Total Bilirubin: <0.2 mg/dL — ABNORMAL LOW (ref 0.3–1.2)
Total Protein: 6.8 g/dL (ref 6.0–8.3)

## 2014-02-26 LAB — SALICYLATE LEVEL: Salicylate Lvl: <2 mg/dL — ABNORMAL LOW (ref 2.8–20.0)

## 2014-02-26 LAB — ACETAMINOPHEN LEVEL: Acetaminophen (Tylenol), Serum: <15 ug/mL (ref 10–30)

## 2014-02-26 LAB — ETHANOL: Alcohol, Ethyl (B): <11 mg/dL (ref 0–11)

## 2014-02-26 MED ORDER — NICOTINE 21 MG/24HR TD PT24: 21.0000 mg | MEDICATED_PATCH | Freq: Every day | TRANSDERMAL | Status: DC

## 2014-02-26 MED ORDER — TRAZODONE HCL 50 MG PO TABS: 150.0000 mg | ORAL_TABLET | Freq: Every day | ORAL | Status: DC

## 2014-02-26 MED ORDER — ALUM & MAG HYDROXIDE-SIMETH 200-200-20 MG/5ML PO SUSP: 30.0000 mL | ORAL | Status: DC | PRN

## 2014-02-26 MED ORDER — MODAFINIL 200 MG PO TABS
200.0000 mg | ORAL_TABLET | Freq: Two times a day (BID) | ORAL | Status: DC
  Administered 2014-02-26: 200 mg via ORAL
  Filled 2014-02-26: qty 1

## 2014-02-26 MED ORDER — ONDANSETRON HCL 4 MG PO TABS: 4.0000 mg | ORAL_TABLET | Freq: Three times a day (TID) | ORAL | Status: DC | PRN

## 2014-02-26 MED ORDER — OLANZAPINE 10 MG PO TABS
10.0000 mg | ORAL_TABLET | Freq: Two times a day (BID) | ORAL | Status: DC
  Administered 2014-02-27: 10 mg via ORAL
  Filled 2014-02-26 (×6): qty 1

## 2014-02-26 MED ORDER — OLANZAPINE 10 MG PO TABS
10.0000 mg | ORAL_TABLET | Freq: Two times a day (BID) | ORAL | Status: DC
  Administered 2014-02-26: 10 mg via ORAL
  Filled 2014-02-26: qty 1

## 2014-02-26 MED ORDER — ACETAMINOPHEN 325 MG PO TABS: 650.0000 mg | ORAL_TABLET | ORAL | Status: DC | PRN

## 2014-02-26 MED ORDER — ZOLPIDEM TARTRATE 5 MG PO TABS: 5.0000 mg | ORAL_TABLET | Freq: Every evening | ORAL | Status: DC | PRN

## 2014-02-26 MED ORDER — CLONAZEPAM 1 MG PO TABS
1.0000 mg | ORAL_TABLET | Freq: Three times a day (TID) | ORAL | Status: DC | PRN
  Administered 2014-03-01 – 2014-03-03 (×2): 1 mg via ORAL
  Filled 2014-02-26 (×3): qty 1

## 2014-02-26 MED ORDER — MODAFINIL 200 MG PO TABS: 100.0000 mg | ORAL_TABLET | Freq: Every day | ORAL | Status: DC

## 2014-02-26 MED ORDER — TRAZODONE HCL 150 MG PO TABS
150.0000 mg | ORAL_TABLET | Freq: Every day | ORAL | Status: DC
  Administered 2014-02-26 – 2014-03-03 (×6): 150 mg via ORAL
  Filled 2014-02-26 (×4): qty 1
  Filled 2014-02-26: qty 3
  Filled 2014-02-26 (×4): qty 1

## 2014-02-26 MED ORDER — NICOTINE 21 MG/24HR TD PT24
21.0000 mg | MEDICATED_PATCH | Freq: Every day | TRANSDERMAL | Status: DC
  Filled 2014-02-26 (×5): qty 1

## 2014-02-26 MED ORDER — ACETAMINOPHEN 325 MG PO TABS
650.0000 mg | ORAL_TABLET | Freq: Four times a day (QID) | ORAL | Status: DC | PRN
  Administered 2014-02-27 – 2014-03-02 (×2): 650 mg via ORAL
  Filled 2014-02-26 (×2): qty 2

## 2014-02-26 MED ORDER — LORAZEPAM 1 MG PO TABS: 1.0000 mg | ORAL_TABLET | Freq: Three times a day (TID) | ORAL | Status: DC | PRN

## 2014-02-26 MED ORDER — MAGNESIUM HYDROXIDE 400 MG/5ML PO SUSP: 30.0000 mL | Freq: Every day | ORAL | Status: DC | PRN

## 2014-02-26 MED ORDER — LEVOTHYROXINE SODIUM 100 MCG PO TABS
100.0000 ug | ORAL_TABLET | Freq: Every day | ORAL | Status: DC
  Administered 2014-02-27 – 2014-03-04 (×6): 100 ug via ORAL
  Filled 2014-02-26 (×9): qty 1

## 2014-02-26 MED ORDER — LEVOTHYROXINE SODIUM 100 MCG PO TABS
100.0000 ug | ORAL_TABLET | Freq: Every day | ORAL | Status: DC
  Administered 2014-02-26: 100 ug via ORAL
  Filled 2014-02-26 (×2): qty 1

## 2014-02-26 MED ORDER — MODAFINIL 200 MG PO TABS
200.0000 mg | ORAL_TABLET | Freq: Two times a day (BID) | ORAL | Status: DC
  Administered 2014-02-26 – 2014-02-27 (×2): 200 mg via ORAL
  Filled 2014-02-26 (×2): qty 1

## 2014-02-26 MED ORDER — IBUPROFEN 200 MG PO TABS
600.0000 mg | ORAL_TABLET | Freq: Three times a day (TID) | ORAL | Status: DC | PRN
  Administered 2014-02-26: 600 mg via ORAL
  Filled 2014-02-26: qty 3

## 2014-02-26 MED ORDER — TRIPLE ANTIBIOTIC 3.5-400-5000 EX OINT
TOPICAL_OINTMENT | Freq: Two times a day (BID) | CUTANEOUS | Status: DC
  Administered 2014-02-26: 2 via TOPICAL
  Filled 2014-02-26 (×3): qty 1

## 2014-02-26 MED ORDER — CLONAZEPAM 1 MG PO TABS
1.0000 mg | ORAL_TABLET | Freq: Three times a day (TID) | ORAL | Status: DC | PRN
Start: 2014-02-26 — End: 2014-02-26

## 2014-02-26 NOTE — ED Notes (Signed)
Per pt, states she took 24 clonazepam in attempt to harm self "states she is angry because she woke up" states she is depressed

## 2014-02-26 NOTE — BH Assessment (Signed)
Assessment Note  Jamie Bennett is a white 53 y.o. female that reports suicidal plan to overdose on medication.  Patient reports she is not able to contract for safety. Patient reports increased anxiety and depression because she feels as if she is a burden on her family.  Patient reports prior four psychiatric hospitalization since January 2015.  Patient reports that when she has a bad panic attack and she is not able to calm down then she has to cut or stab herself in order to calm herself down.  Patient reports that she has been cutting or stabbing or cutting herself since she was 53 years old.   Patient reports compliance with taking her medication.  Patient denies HI and substance abuse.  Patient BAL is <11 and her UDS is negative.  Patient denies physical, sexual or emotional abuse. Patient is compliant with taking her medication for medication management.  Patient receives Intensive Group Therapy (IOP) at [facility].    Axis I: Bipolar, Depressed Axis II: Deferred Axis III:  Past Medical History  Diagnosis Date  . Osteoarthritis   . Osteoporosis   . Rheumatoid arthritis   . Thyroid disease   . Hypotension   . Constipation   . Anemia   . Bipolar 1 disorder   . Anxiety    Axis IV: other psychosocial or environmental problems, problems related to social environment and problems with access to health care services Axis V: 31-40 impairment in reality testing  Past Medical History:  Past Medical History  Diagnosis Date  . Osteoarthritis   . Osteoporosis   . Rheumatoid arthritis   . Thyroid disease   . Hypotension   . Constipation   . Anemia   . Bipolar 1 disorder   . Anxiety     Past Surgical History  Procedure Laterality Date  . Gastric bypass    . Cholecystectomy      Family History:  Family History  Problem Relation Age of Onset  . Depression Mother   . Dementia Mother     Social History:  reports that she has never smoked. She does not  have any smokeless tobacco history on file. She reports that she does not drink alcohol or use illicit drugs.  Additional Social History:     CIWA: CIWA-Ar BP: 94/56 mmHg Pulse Rate: 64 COWS:    Allergies: No Known Allergies  Home Medications:  (Not in a hospital admission)  OB/GYN Status:  No LMP recorded. Patient is postmenopausal.  General Assessment Data Location of Assessment: [facility] ED Is this a Tele or Face-to-Face Assessment?: Face-to-Face Is this an Initial Assessment or a Re-assessment for this encounter?: Initial Assessment Living Arrangements: Spouse/significant other Can pt return to current living arrangement?: Yes Admission Status: Voluntary Is patient capable of signing voluntary admission?: Yes Transfer from: Acute Hospital Referral Source: Self/Family/Friend  Medical Screening Exam ([place]) Medical Exam completed: Yes  [place] Living Arrangements: Spouse/significant other Name of Psychiatrist: Dr. Wylene Simmer Name of Therapist: [facility]  Education Status Is patient currently in school?: No Current Grade: NA Highest grade of school patient has completed: NA Name of school: NA Contact person: NA  Risk to self with the past 6 months Suicidal Ideation: Yes-Currently Present Suicidal Intent: Yes-Currently Present Is patient at risk for suicide?: Yes Suicidal Plan?: Yes-Currently Present Specify Current Suicidal Plan: Overdose on medication  Access to Means: Yes Specify Access to Suicidal Means: pills What has been your use  of drugs/alcohol within the last 12 months?: None Reported Previous Attempts/Gestures: Yes How many times?: 4 Other Self Harm Risks: stabbing herself wtih sharp objects Triggers for Past Attempts: Other (Comment) Intentional Self Injurious Behavior: Cutting Comment - Self Injurious Behavior: Stabbing herself in the arm with twizers. Family Suicide History: No Recent stressful life event(s):   (None Reported) Persecutory voices/beliefs?: No Depression: Yes Depression Symptoms: Despondent;Insomnia;Tearfulness;Isolating;Fatigue;Guilt;Loss of interest in usual pleasures;Feeling worthless/self pity Substance abuse history and/or treatment for substance abuse?: No Suicide prevention information given to non-admitted patients: Yes  Risk to Others within the past 6 months Homicidal Ideation: No Thoughts of Harm to Others: No Current Homicidal Intent: No Current Homicidal Plan: No Access to Homicidal Means: No Identified Victim: None Reported History of harm to others?: No Assessment of Violence: None Noted Violent Behavior Description: None Reported Does patient have access to weapons?: No Criminal Charges Pending?: No Describe Pending Criminal Charges: N/A Does patient have a court date: No  Psychosis Hallucinations: None noted Delusions: None noted  Mental Status Report Appear/Hygiene: Disheveled Eye Contact: Fair Motor Activity: Freedom of movement;Restlessness Speech: Logical/coherent;Loud Level of Consciousness: Alert Mood: Depressed;Anxious;Helpless Affect: Depressed Anxiety Level: Minimal Thought Processes: Coherent;Relevant Judgement: Unimpaired Orientation: Person;Place;Time;Situation Obsessive Compulsive Thoughts/Behaviors: None  Cognitive Functioning Concentration: Decreased Memory: Recent Intact;Remote Intact IQ: Average Insight: Fair Impulse Control: Fair Appetite: Fair Weight Loss: 0 Weight Gain: 0 Sleep: Decreased Total Hours of Sleep: 4 Vegetative Symptoms: Staying in bed;Decreased grooming  ADLScreening [facility]) Patient's cognitive ability adequate to safely complete daily activities?: Yes Patient able to express need for assistance with ADLs?: Yes Independently performs ADLs?: Yes (appropriate for developmental age)  Prior Inpatient Therapy Prior Inpatient Therapy: Yes Prior Therapy Dates: Unabl to remember  (Reports  being hospitals 4 times since January for SI) Prior Therapy Facilty/Provider(s): [facility] and [facility]  Reason for Treatment: SI  Prior Outpatient Therapy Prior Outpatient Therapy: Yes Prior Therapy Dates: Ongoing  Prior Therapy Facilty/Provider(s): Dr. Toy Care for Medication Management  ([facility] for IOP Group Therapy) Reason for Treatment: Medication Management  (Group Therapy at [facility])  ADL Screening (condition at time of admission) Patient's cognitive ability adequate to safely complete daily activities?: Yes Patient able to express need for assistance with ADLs?: Yes Independently performs ADLs?: Yes (appropriate for developmental age)         Values / Beliefs Cultural Requests During Hospitalization: None Spiritual Requests During Hospitalization: None        Additional Information 1:1 In Past 12 Months?: No CIRT Risk: No Elopement Risk: No Does patient have medical clearance?: Yes     Disposition: Per Dr. Lovena Le the patient meets criteria for inpatient hospitalization.  Per Otila Kluver [facility]) there may be open beds at [facility].   Disposition Initial Assessment Completed for this Encounter: Yes Disposition of Patient: Inpatient treatment program Type of inpatient treatment program: Adult  On Site Evaluation by:   Reviewed with Physician:    Graciella Freer [place] 02/26/2014 1:14 PM

## 2014-02-26 NOTE — Tx Team (Signed)
Initial Interdisciplinary Treatment Plan   PATIENT STRESSORS: Health problems Marital or family conflict   PROBLEM LIST: Problem List/Patient Goals Date to be addressed Date deferred Reason deferred Estimated date of resolution  Risk for Suicide 02/26/2014           Depression 02/26/2014                                          DISCHARGE CRITERIA:  Reduction of life-threatening or endangering symptoms to within safe limits Safe-care adequate arrangements made  PRELIMINARY DISCHARGE PLAN: Attend PHP/IOP  PATIENT/FAMIILY INVOLVEMENT: This treatment plan has been presented to and reviewed with the patient, Jamie Bennett.  The patient and family have been given the opportunity to ask questions and make suggestions.  Gaylan Gerold E 02/26/2014, 6:42 PM

## 2014-02-26 NOTE — Progress Notes (Signed)
Jamie Bennett is a 52 y.o. married, caucasian female who has been in [place] since 02-09-14.  Pt arrived this a.m. In crisis.  Pt was very tearful.  Writer met with pt.  Pt reports that she decompensates everyday whenever she leaves MH-IOP and returns home where husband is.  According to patient, husband hasn't been doing well mentally.  "Wil[place] Mylar was a bad day for him, so whenever I got home he started bringing me down."  Pt states she suggested that she go visit someone for a while, so they will have space between them.  Pt reports that her husband became very upset and mentioned that pt was abandoning him.  Pt states she went with him to do errands yesterday and while she was waiting for him, she took an ink pen and jabbed her rt leg.  "The blood soaked through my pants."  Pt then went on to say that when she arrived home she took #24 Klonopin.  Pt has a Band-Aid on her left arm and stated that she took a pair of tweezers and cut her arm this morning before arriving at [facility].  A:  Informed Dr. Salem Senate of patient's suicide attempt.  Placed call to Arby Barrette [facility] ED) to give report, called TTS and spoke to [facility], called Otila Kluver [facility]) to request a tech to ride with patient over to ED, then called [facility] transportation.  R:  Pt receptive.

## 2014-02-26 NOTE — Progress Notes (Signed)
Adult Psychoeducational Group Note  Date:  02/26/2014 Time:  10:00 PM  Group Topic/Focus:  Wrap-Up Group:   The focus of this group is to help patients review their daily goal of treatment and discuss progress on daily workbooks.  Participation Level:  Active  Participation Quality:  Appropriate  Affect:  Appropriate  Cognitive:  Alert and Oriented  Insight: Appropriate  Engagement in Group:  Developing/Improving  Modes of Intervention:  Exploration and Support  Additional Comments:  Patient verbalized that she is definitely getting use to the schedule. Patient stated that while she is here she wants to work autonomy. Patient stated that a coping skill she can use is to do things myself. Patient rated her day as a 0. Patient stated that she can improve her day by attending her classes.  Jamie Bennett, Patrick North 02/26/2014, 10:00 PM

## 2014-02-26 NOTE — Progress Notes (Signed)
Patient ID: Jamie Bennett, female   DOB: 12/26/1960, 53 y.o.   MRN: [id number]  Jamie Bennett is a 53 year old female admitted voluntarily to the adult unit post suicide attempt. Patient took 24 1 mg Clonazepam as an intentional overdose, patient reports is due to patient feeling like a burden to her family. Patient reports when she woke up she was angry because she was alive. Patient reports that she has no coping skills and that is why she tried to kill herself and cause self-inflicted scratches to her left arm. Patient has an extensive past medical history included but not limited to osteoporosis, hypotension, osteoarthritis, and anemia. Patient currently denies SI, HI and A/V hallucinations. Patient reports that she hopes she will learn coping skills here. Patient was encouraged to go to groups and think of herself while in the facility. Patient was oriented to the unit and verbalized understanding of the admission process. Q15 minute safety checks were initiated and are maintained. Patient is safe at this time.

## 2014-02-26 NOTE — ED Notes (Signed)
Patient belongings: sneakers, shirt, pants, socks, and purse

## 2014-02-26 NOTE — ED Provider Notes (Signed)
CSN: [id number]     Arrival date & time 02/26/14  6270 History   First MD Initiated Contact with Patient 02/26/14 [phone]     Chief Complaint  Patient presents with  . Drug Overdose     (Consider location/radiation/quality/duration/timing/severity/associated sxs/prior Treatment) HPI Comments: Took 24 1mg  Klonopins with intent to die.  Patient is a 53 y.o. female presenting with Overdose. The history is provided by the patient.  Drug Overdose This is a recurrent problem. The current episode started 6 to 12 hours ago. Episode frequency: once. The problem has been resolved. Pertinent negatives include no chest pain, no abdominal pain and no shortness of breath. Nothing aggravates the symptoms. Nothing relieves the symptoms.    Past Medical History  Diagnosis Date  . Osteoarthritis   . Osteoporosis   . Rheumatoid arthritis   . Thyroid disease   . Hypotension   . Constipation   . Anemia   . Bipolar 1 disorder   . Anxiety    Past Surgical History  Procedure Laterality Date  . Gastric bypass    . Cholecystectomy     Family History  Problem Relation Age of Onset  . Depression Mother   . Dementia Mother    History  Substance Use Topics  . Smoking status: Never Smoker   . Smokeless tobacco: Not on file  . Alcohol Use: No   OB History   Grav Para Term Preterm Abortions TAB SAB Ect Mult Living                 Review of Systems  Constitutional: Negative for fever and chills.  Respiratory: Negative for shortness of breath.   Cardiovascular: Negative for chest pain.  Gastrointestinal: Negative for abdominal pain.  All other systems reviewed and are negative.     Allergies  Review of patient's allergies indicates no known allergies.  Home Medications   Prior to Admission medications   Medication Sig Start Date End Date Taking? Authorizing Provider  alendronate (FOSAMAX) 70 MG tablet Take 70 mg by mouth once a week. Take with a full glass of water on an empty stomach.     Historical Provider, MD  Cholecalciferol (VITAMIN D3) 400 UNITS CAPS Take 1 tablet by mouth daily.    Historical Provider, MD  Cyanocobalamin (VITAMIN B-12 IJ) Inject as directed every 30 (thirty) days.    Historical Provider, MD  esomeprazole (NEXIUM) 40 MG capsule Take 40 mg by mouth daily at 12 noon.    Historical Provider, MD  LACTULOSE PO Take 30 mLs by mouth 3 (three) times daily.    Historical Provider, MD  Loratadine-Pseudoephedrine (CLARITIN-D 12 HOUR PO) Take 1 tablet by mouth 2 (two) times daily as needed (allergies).    Historical Provider, MD  modafinil (PROVIGIL) 200 MG tablet Take 200 mg by mouth 3 (three) times daily.    Historical Provider, MD  Multiple Vitamins-Minerals (MULTIVITAMIN PO) Take 1 tablet by mouth daily.    Historical Provider, MD  Nutritional Supplements (ESTROVEN PO) Take 1 tablet by mouth daily.    Historical Provider, MD  sucralfate (CARAFATE) 1 G tablet Take 1 g by mouth 2 (two) times daily as needed (tomach pain).    Historical Provider, MD  ursodiol (ACTIGALL) 300 MG capsule Take 300 mg by mouth 4 (four) times daily.     Historical Provider, MD   BP 114/74  Pulse 72  Temp(Src) 98.3 F (36.8 C) (Oral)  Resp 18  SpO2 100% Physical Exam  Nursing note and vitals  reviewed. Constitutional: She is oriented to person, place, and time. She appears well-developed and well-nourished. No distress.  HENT:  Head: Normocephalic and atraumatic.  Mouth/Throat: Oropharynx is clear and moist.  Eyes: EOM are normal. Pupils are equal, round, and reactive to light.  Neck: Normal range of motion. Neck supple.  Cardiovascular: Normal rate and regular rhythm.  Exam reveals no friction rub.   No murmur heard. Pulmonary/Chest: Effort normal and breath sounds normal. No respiratory distress. She has no wheezes. She has no rales.  Abdominal: Soft. She exhibits no distension. There is no tenderness. There is no rebound.  Musculoskeletal: Normal range of motion. She exhibits no  edema.  Neurological: She is alert and oriented to person, place, and time.  Skin: No rash noted. She is not diaphoretic.    ED Course  Procedures (including critical care time) Labs Review Labs Reviewed  CBC - Abnormal; Notable for the following:    RBC 3.43 (*)    Hemoglobin 9.2 (*)    HCT 29.2 (*)    All other components within normal limits  COMPREHENSIVE METABOLIC PANEL - Abnormal; Notable for the following:    Albumin 3.3 (*)    Alkaline Phosphatase 123 (*)    Total Bilirubin <0.2 (*)    All other components within normal limits  SALICYLATE LEVEL - Abnormal; Notable for the following:    Salicylate Lvl <2.0 (*)    All other components within normal limits  URINE RAPID DRUG SCREEN (HOSP PERFORMED) - Abnormal; Notable for the following:    Benzodiazepines POSITIVE (*)    All other components within normal limits  ACETAMINOPHEN LEVEL  ETHANOL    Imaging Review No results found.   EKG Interpretation None      MDM   Final diagnoses:  Drug overdose, intentional self-harm, initial encounter    53 year old female here after clonazepam overdose. Took pills around midnight. Had 24 1 mg tablets. Had no intention of waking up. History of suicide attempt previously with Prozac several months ago. Has had some increased stress today and lately. She feels she is a burden on her family because of her psychiatric issues. Her husband also having marital stresses. She was unable to relax or strain take time for herself because she attended her husband stress, which she thinks pushed her over the edge. Tearful here, vitals stable, no hypoxia. She is stable for Psych Observation. TTS consulted.    Osvaldo Shipper, MD 02/26/14 [phone]

## 2014-02-26 NOTE — BHH Counselor (Signed)
Per Letitia Libra [facility] at [facility], pt accepted to bed 505-1. Support paperwork signed and faxed to [facility]. Originals placed in pt's chart.   Arnold Long, [facility] Assessment Counselor,

## 2014-02-26 NOTE — ED Notes (Signed)
Patient states that she woke up from nap and realized where she was and began self injuring. Patient broke up a fork and made several superficial cuts and skin tears on her left forearm.

## 2014-02-27 ENCOUNTER — Other Ambulatory Visit (HOSPITAL_COMMUNITY): Payer: 59

## 2014-02-27 DIAGNOSIS — F411 Generalized anxiety disorder: Secondary | ICD-10-CM

## 2014-02-27 MED ORDER — BENZOCAINE 10 % MT GEL
Freq: Four times a day (QID) | OROMUCOSAL | Status: DC | PRN
  Administered 2014-02-27: 21:00:00 via OROMUCOSAL
  Filled 2014-02-27: qty 9.4

## 2014-02-27 MED ORDER — VENLAFAXINE HCL ER 75 MG PO CP24
75.0000 mg | ORAL_CAPSULE | Freq: Every day | ORAL | Status: DC
  Administered 2014-02-27 – 2014-03-02 (×4): 75 mg via ORAL
  Filled 2014-02-27 (×3): qty 1
  Filled 2014-02-27: qty 2
  Filled 2014-02-27: qty 1
  Filled 2014-02-27: qty 2
  Filled 2014-02-27: qty 1

## 2014-02-27 MED ORDER — IBUPROFEN 600 MG PO TABS
600.0000 mg | ORAL_TABLET | Freq: Three times a day (TID) | ORAL | Status: DC | PRN
  Administered 2014-03-01 – 2014-03-03 (×2): 600 mg via ORAL
  Filled 2014-02-27 (×2): qty 1

## 2014-02-27 MED ORDER — MODAFINIL 200 MG PO TABS
200.0000 mg | ORAL_TABLET | Freq: Two times a day (BID) | ORAL | Status: DC
  Administered 2014-02-27 – 2014-03-04 (×10): 200 mg via ORAL
  Filled 2014-02-27 (×10): qty 1

## 2014-02-27 NOTE — Patient Instructions (Signed)
Patient was admitted to inpt unit at [facility].  F/U with Dr. Toy Care and possibly Nunzio Cobbs, LCSW once d/c'd from inpt.
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
## 2014-02-27 NOTE — Tx Team (Signed)
Interdisciplinary Treatment Plan Update   Date Reviewed:  02/27/2014  Time Reviewed:  9:51 AM  Progress in Treatment:   Attending groups: Yes Participating in groups: Yes Taking medication as prescribed: Yes  Tolerating medication: Yes Family/Significant other contact made:  No, but will ask patient for consent for collateral contact Patient understands diagnosis: Yes  Discussing patient identified problems/goals with staff: Yes Medical problems stabilized or resolved: Yes Denies suicidal/homicidal ideation: Yes Patient has not harmed self or others: Yes  For review of initial/current patient goals, please see plan of care.  Estimated Length of Stay:  3-5 days  Reasons for Continued Hospitalization:  Anxiety Depression Medication stabilization   New Problems/Goals identified:    Discharge Plan or Barriers:   Home with outpatient follow up with Dr. Toy Care  Additional Comments:  Attendees:  Patient:  02/27/2014 9:51 AM   Signature:  Gabriel Earing, MD 02/27/2014 9:51 AM  Signature:  02/27/2014 9:51 AM  Signature:  Eduard Roux, RN 02/27/2014 9:51 AM  Signature: 02/27/2014 9:51 AM  Signature:   02/27/2014 9:51 AM  Signature:  Joette Catching, LCSW 02/27/2014 9:51 AM  Signature:   02/27/2014 9:51 AM  Signature:   02/27/2014 9:51 AM  Signature:  02/27/2014 9:51 AM  Signature: Marilynne Halsted, RN 02/27/2014  9:51 AM  Signature:   Lars Pinks, RN [facility] 02/27/2014  9:51 AM  Signature: 02/27/2014  9:51 AM    Scribe for Treatment Team:   Joette Catching,  02/27/2014 9:51 AM

## 2014-02-27 NOTE — BHH Group Notes (Signed)
[facility] LCSW Aftercare Discharge Planning Group Note   02/27/2014 10:35 AM    Participation Quality:  Appropraite  Mood/Affect:  Appropriate  Depression Rating:  8  Anxiety Rating:  10  Thoughts of Suicide:  No  Will you contract for safety?   NA  Current AVH:  No  Plan for Discharge/Comments:  Patient attended discharge planning group and actively participated in group.  She is followed outpatient by Dr. Toy Care.  CSW provided all participants with daily workbook.   Transportation Means: Patient has transportation.   Supports:  Patient has a support system.   Josedejesus Marcum, [name]

## 2014-02-27 NOTE — Progress Notes (Signed)
D: Patient appropriate and cooperative with staff and peers. Patient's affect and mood are both depressed and anxious. She reported on the self inventory sheet that she's sleeping fair, good appetite, low energy level and poor ability to concentrate. Patient rates depression "6", feelings of hopelessness "9" and anxiety "8". She's actively participating in groups and visible in the milieu. Patient is compliant with medications.  A: Support and encouragement provided to patient. Scheduled medications administered per MD orders. Maintain Q15 minute checks for safety.  R: Patient receptive. Denies SI/HI and AVH. Patient remains safe.

## 2014-02-27 NOTE — Progress Notes (Signed)
Patient ID: Jamie Bennett, female   DOB: January 31, 1961, 53 y.o.   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 Pt reports she had not been able to find a suitable replacement since then. Pt reports feeling very angry after she woke from overdosing because she wanted to die.  She decided to cut left  Forearm with with her fork. Pt mood and affect appeared depressed and sad. Pt denies SI/HI/AVH and pain. Pt attended evening wrap up group and engage in discussion. Cooperative with assessment. No acute distressed noted at this time.   A: Met with pt 1:1. Medications administered as prescribed. Writer encouraged pt to discuss feelings. Pt encouraged to come to staff with any question or concerns.   R: Patient remains safe. She is complaint with medications and denies any adverse reaction. Continue current POC.

## 2014-02-27 NOTE — Progress Notes (Signed)
Adult Psychoeducational Group Note  Date:  02/27/2014 Time:  11:27 AM  Group Topic/Focus:  Relapse Prevention Planning:   The focus of this group is to define relapse and discuss the need for planning to combat relapse.  Participation Level:  Active  Participation Quality:  Appropriate  Affect:  Depressed  Cognitive:  Alert  Insight: Limited  Engagement in Group:  Engaged  Modes of Intervention:  Discussion  Additional Comments:  Purpose of group was to discuss relapse prevention. Pt states that she does not have the chance to work on self due to husbands constant needs.  Verbena Boeding, Clyda Hurdle 02/27/2014, 11:27 AM

## 2014-02-27 NOTE — Progress Notes (Addendum)
After returning back from lunch, patient reported that her tooth fell out while eating her meal. Writer assessed for bleeding, pain and swelling; currently no s/s present. Tylenol offered to the patient at the time of complaint; pt. voiced that she did not want the medication because her pain was tolerable; also she verbalized that too much of it is not good for your liver, so she tries to take the least amount as possible. Writer informed patient that if the pain begins to be unbearable please request pain medication. PRN Tylenol given at 1517 for upper left tooth pain.

## 2014-02-27 NOTE — Progress Notes (Signed)
Patient ID: Jamie Bennett, female   DOB: Mar 13, 1961, 53 y.o.   MRN: [id number] D: Patient reports pain from a chipped tooth earlier today. Pt rated pain as 7 on a 0-10 scale. Pt and husband wanted to go to ED to get tooth capped. After talking to NP pt decided use of pain medication and follow up with dentist after discharge. Pt mood and affect appeared depressed and flat. Pt denies SI/HI/AVH. Pt attended evening wrap up group and engage in discussion. Cooperative with assessment. No acute distressed noted at this time.   A: Met with pt 1:1. Medications administered as prescribed. Writer encouraged pt to discuss feelings. Pt encouraged to come to staff with any question or concerns.   R: Patient remains safe. She is complaint with medications and denies any adverse reaction. Continue current POC.

## 2014-02-27 NOTE — Progress Notes (Signed)
Adult Psychoeducational Group Note  Date:  02/27/2014 Time:  11:08 PM  Group Topic/Focus:  Wrap-Up Group:   The focus of this group is to help patients review their daily goal of treatment and discuss progress on daily workbooks.  Participation Level:  Active  Participation Quality:  Appropriate  Affect:  Appropriate  Cognitive:  Appropriate  Insight: Appropriate  Engagement in Group:  Engaged  Modes of Intervention:  Discussion  Additional Comments:  Pt was present for wrap up group. She said that today was her first day here. Her goal was to "learn the ropes" and she reports she has accomplished this.   Harrie Foreman A 02/27/2014, 11:08 PM
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
## 2014-02-27 NOTE — BHH Counselor (Signed)
Adult Comprehensive Assessment  Patient ID: Jamie Bennett, female   DOB: 1960/10/27, 53 y.o.   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 Family Relationships: Feels overwhelmed from taking care of husband who has mental health Software engineer / Lack of resources (include bankruptcy): None Housing / Lack of housing: None Physical health (include injuries & life threatening diseases): Rheumotoid artirithis  chemically induced psorosis Social relationships: None Substance abuse: Patient reports she sometimes abuses prescription medications Bereavement / Loss: None  Living/Environment/Situation:  Living Arrangements: Spouse/significant other Living conditions (as described by patient or guardian): Good How long has patient lived in current situation?: 18 years What is atmosphere in current home: Comfortable;Supportive;Loving  Family History:  Marital status: Married Number of Years Married: 53 What types of issues is patient dealing with in the relationship?: Husband's illness Additional relationship information: N/A Does patient have children?: Yes How many children?: 1 How is patient's relationship with their children?: Son is 76 years old. Patient feels she is a burden to him  Childhood History:  By whom was/is the patient raised?: Both parents Additional childhood history information: Patient reports having a good childhood .  Father was in the [employer] Description of patient's relationship with caregiver when they were a child: Good relationship Patient's description of current relationship with people who raised him/her: Okay relationship with parents who live in [place] Does patient have siblings?: Yes Number of Siblings: 1 Description of patient's current relationship with siblings: Very Did patient suffer any  verbal/emotional/physical/sexual abuse as a child?: No Did patient suffer from severe childhood neglect?: No Has patient ever been sexually abused/assaulted/raped as an adolescent or adult?: No Was the patient ever a victim of a crime or a disaster?: No Witnessed domestic violence?: No Has patient been effected by domestic violence as an adult?: No  Education:  Highest grade of school patient has completed: Four years of college  Employment/Work Situation:   Employment situation: On disability Why is patient on disability: Mental health and Rheumotoid Arthistis How long has patient been on disability: One month  Patient's job has been impacted by current illness: No What is the longest time patient has a held a job?: 12 years Where was the patient employed at that time?: Teacher Has patient ever been in the [employer]?: No Has patient ever served in Recruitment consultant?: No  Financial Resources:   Museum/gallery curator resources: Armed forces training and education officer Does patient have a Programmer, applications or guardian?: No  Alcohol/Substance Abuse:   What has been your use of drugs/alcohol within the last 12 months?: Reports she sometimes doubles or triples her Rx medications If attempted suicide, did drugs/alcohol play a role in this?: No Alcohol/Substance Abuse Treatment Hx: Denies past history Has alcohol/substance abuse ever caused legal problems?: No  Social Support System:   Pensions consultant Support System: Fair Astronomer System: Attends church   Engineer, production Type of faith/religion: Darrick Meigs How does patient's faith help to cope with current illness?: Has not applied her faith  Leisure/Recreation:   Leisure and Hobbies: Reading and craftsx  Strengths/Needs:   What things does the patient do well?: Sewing, and makes jewelry In what areas does patient struggle / problems for patient: Motivation to do things  Discharge Plan:   Does patient have access to transportation?: Yes Will  patient be returning to same living situation after discharge?: Yes Currently receiving community mental health services: Yes (From Whom) (  [facility]) If no, would patient like referral for services when discharged?: No Does patient have financial barriers related to discharge medications?: No  Summary/Recommendations:  Jamie Bennett is a 53 years old Caucasian female admitted with Bipolar Disorder following a suicide attempt.  She will benefit from crisis stabilization, evaluation for medication, psycho-education groups for coping skills development, group therapy and case management for discharge planning.     Jamie Bennett, [name]. 02/27/2014

## 2014-02-27 NOTE — BHH Group Notes (Signed)
[place] LCSW Group Therapy  Relapse Prevention  02/27/2014 3:44 PM  Type of Therapy:  Group Therapy  Participation Level:  Patient left group to meet with MD.    Concha Pyo 02/27/2014, 3:44 PM

## 2014-02-27 NOTE — BHH Suicide Risk Assessment (Signed)
   Nursing information obtained from:  Patient Demographic factors:  Caucasian Current Mental Status:  Self-harm thoughts;Self-harm behaviors Loss Factors:  Loss of significant relationship Historical Factors:  Impulsivity;Family history of mental illness or substance abuse Risk Reduction Factors:  Living with another person, especially a relative Total Time spent with patient: 45 minutes  CLINICAL FACTORS:   Depression:   Anhedonia  Psychiatric Specialty Exam: Physical Exam  ROS  Blood pressure 100/68, pulse 92, temperature 97.3 F (36.3 C), temperature source Oral, resp. rate 16, height 5\' 4"  (1.626 m), weight 90.719 kg (200 lb).Body mass index is 34.31 kg/(m^2).  SEE ADMIT NOTE MSE   COGNITIVE FEATURES THAT CONTRIBUTE TO RISK:  Polarized thinking    SUICIDE RISK:   Moderate:  Frequent suicidal ideation with limited intensity, and duration, some specificity in terms of plans, no associated intent, good self-control, limited dysphoria/symptomatology, some risk factors present, and identifiable protective factors, including available and accessible social support.  PLAN OF CARE: Patient will be admitted to inpatient psychiatric unit for stabilization and safety. Will provide and encourage milieu participation. Provide medication management and maked adjustments as needed.  Will follow daily.    I certify that inpatient services furnished can reasonably be expected to improve the patient's condition.  Orestes Geiman 02/27/2014, 2:37 PM

## 2014-02-28 ENCOUNTER — Ambulatory Visit: Payer: Self-pay | Admitting: Hematology and Oncology

## 2014-02-28 DIAGNOSIS — F332 Major depressive disorder, recurrent severe without psychotic features: Secondary | ICD-10-CM

## 2014-02-28 DIAGNOSIS — R45851 Suicidal ideations: Secondary | ICD-10-CM

## 2014-02-28 DIAGNOSIS — F411 Generalized anxiety disorder: Secondary | ICD-10-CM

## 2014-02-28 DIAGNOSIS — F4325 Adjustment disorder with mixed disturbance of emotions and conduct: Secondary | ICD-10-CM

## 2014-02-28 LAB — AMMONIA: Ammonia: 17 umol/L (ref 11–60)

## 2014-02-28 MED ORDER — OLANZAPINE 5 MG PO TBDP
5.0000 mg | ORAL_TABLET | Freq: Three times a day (TID) | ORAL | Status: DC | PRN
  Administered 2014-02-28 – 2014-03-04 (×9): 5 mg via ORAL
  Filled 2014-02-28 (×9): qty 1

## 2014-02-28 MED ORDER — GABAPENTIN 100 MG PO CAPS
100.0000 mg | ORAL_CAPSULE | Freq: Three times a day (TID) | ORAL | Status: DC
  Administered 2014-02-28 – 2014-03-03 (×10): 100 mg via ORAL
  Filled 2014-02-28 (×15): qty 1

## 2014-02-28 MED ORDER — PANTOPRAZOLE SODIUM 40 MG PO TBEC
80.0000 mg | DELAYED_RELEASE_TABLET | Freq: Every day | ORAL | Status: DC
  Administered 2014-02-28 – 2014-03-04 (×5): 80 mg via ORAL
  Filled 2014-02-28 (×6): qty 2

## 2014-02-28 NOTE — Progress Notes (Signed)
[facility] MD Progress Note  02/28/2014 1:55 PM Jamie Bennett  MRN:  [id number] Subjective:  Pt seen and chart reviewed. Pt reports that he had recent SI, feeling overwhelmed to the point where she took an overdose of Klonopin. During assessment, pt reports that her family continues to talk about where she should go in terms of treatment and/or placement to live for 30days + treatment programs and she feels that she is causing them a lot of stress.   HPI: Jamie Bennett is a 53 year old female admitted voluntarily to the adult unit post suicide attempt. Patient took 24 1 mg Clonazepam as an intentional overdose, patient reports is due to patient feeling like a burden to her family. Patient reports when she woke up she was angry because she was alive. Patient reports that she has no coping skills and that is why she tried to kill herself and cause self-inflicted scratches to her left arm. Patient has an extensive past medical history included but not limited to osteoporosis, hypotension, osteoarthritis, and anemia. Patient currently denies SI, HI and A/V hallucinations. Patient reports that she hopes she will learn coping skills here. Patient was encouraged to go to groups and think of herself while in the facility. Patient was oriented to the unit and verbalized understanding of the admission process. Q15 minute safety checks were initiated and are maintained. Patient is safe at this time.  Diagnosis:   DSM5: Depressive Disorders:  Major Depressive Disorder - Severe (296.23) Total Time spent with patient: 30 minutes  Axis I: Adjustment Disorder with Mixed Disturbance of Emotions and Conduct, Generalized Anxiety Disorder and Major Depression, Recurrent severe Axis II: Deferred Axis III:  Past Medical History  Diagnosis Date  . Osteoarthritis   . Osteoporosis   . Rheumatoid arthritis   . Thyroid disease   . Hypotension   . Constipation   . Anemia   . Bipolar 1 disorder   . Anxiety    Axis IV: other  psychosocial or environmental problems and problems related to social environment Axis V: 41-50 serious symptoms  ADL's:  Intact  Sleep: Good  Appetite:  Good  Suicidal Ideation:  Yes, contracts currently Homicidal Ideation:  Denies AEB (as evidenced by):  Psychiatric Specialty Exam: Physical Exam  Review of Systems  Constitutional: Negative.   HENT: Negative.   Eyes: Negative.   Respiratory: Negative.   Cardiovascular: Negative.   Gastrointestinal: Negative.   Genitourinary: Negative.   Musculoskeletal: Negative.   Skin: Negative.   Neurological: Negative.   Endo/Heme/Allergies: Negative.   Psychiatric/Behavioral: Positive for depression. The patient is nervous/anxious.     Blood pressure 138/91, pulse 68, temperature 98.2 F (36.8 C), temperature source Oral, resp. rate 20, height 5\' 4"  (1.626 m), weight 90.719 kg (200 lb).Body mass index is 34.31 kg/(m^2).  General Appearance: Fairly Groomed  Engineer, water::  Good  Speech:  Clear and Coherent  Volume:  Normal  Mood:  Anxious  Affect:  Appropriate  Thought Process:  Coherent and Goal Directed  Orientation:  Full (Time, Place, and Person)  Thought Content:  WDL  Suicidal Thoughts:  Yes.  without intent/plan  Homicidal Thoughts:  No  Memory:  Immediate;   Fair Recent;   Fair Remote;   Fair  Judgement:  Fair  Insight:  Fair  Psychomotor Activity:  Normal  Concentration:  Good  Recall:  Good  Fund of Knowledge:Good  Language: Good  Akathisia:  No  Handed:  Right  AIMS (if indicated):     Assets:  Communication Skills Desire for Improvement Financial Resources/Insurance Housing Leisure Time Resilience Social Support Transportation  Sleep:  Number of Hours: 5.75   Musculoskeletal: Strength & Muscle Tone: within normal limits Gait & Station: normal Patient leans: N/A  Current Medications: Current Facility-Administered Medications  Medication Dose Route Frequency Provider Last Rate Last Dose  .  acetaminophen (TYLENOL) tablet 650 mg  650 mg Oral Q6H PRN Clarene Reamer, MD   650 mg at 02/27/14 1517  . alum & mag hydroxide-simeth (MAALOX/MYLANTA) 200-200-20 MG/5ML suspension 30 mL  30 mL Oral Q4H PRN Clarene Reamer, MD      . benzocaine (ORAJEL) 10 % mucosal gel   Mouth/Throat QID PRN Malena Peer, NP      . clonazePAM (KLONOPIN) tablet 1 mg  1 mg Oral TID PRN Clarene Reamer, MD      . gabapentin (NEURONTIN) capsule 100 mg  100 mg Oral TID Benjamine Mola, FNP   100 mg at 02/28/14 1353  . ibuprofen (ADVIL,MOTRIN) tablet 600 mg  600 mg Oral Q8H PRN Evanna Glenda Chroman, NP      . levothyroxine (SYNTHROID, LEVOTHROID) tablet 100 mcg  100 mcg Oral QAC breakfast Clarene Reamer, MD   100 mcg at 02/28/14 0605  . magnesium hydroxide (MILK OF MAGNESIA) suspension 30 mL  30 mL Oral Daily PRN Clarene Reamer, MD      . modafinil (PROVIGIL) tablet 200 mg  200 mg Oral BID Neita Garnet, MD   200 mg at 02/28/14 1191  . nicotine (NICODERM CQ - dosed in mg/24 hours) patch 21 mg  21 mg Transdermal Daily Clarene Reamer, MD      . OLANZapine zydis (ZYPREXA) disintegrating tablet 5 mg  5 mg Oral Q8H PRN Benjamine Mola, FNP      . ondansetron [facility]) tablet 4 mg  4 mg Oral Q8H PRN Clarene Reamer, MD      . pantoprazole (PROTONIX) EC tablet 80 mg  80 mg Oral Q1200 Benjamine Mola, FNP   80 mg at 02/28/14 1353  . traZODone (DESYREL) tablet 150 mg  150 mg Oral QHS Clarene Reamer, MD   150 mg at 02/27/14 2115  . venlafaxine XR (EFFEXOR-XR) 24 hr capsule 75 mg  75 mg Oral Daily Neita Garnet, MD   75 mg at 02/28/14 4782    Lab Results: No results found for this or any previous visit (from the past 48 hour(s)).  Physical Findings: AIMS: Facial and Oral Movements Muscles of Facial Expression: None, normal Lips and Perioral Area: None, normal Jaw: None, normal Tongue: None, normal,Extremity Movements Upper (arms, wrists, hands, fingers): None, normal Lower (legs, knees, ankles, toes): None,  normal, Trunk Movements Neck, shoulders, hips: None, normal, Overall Severity Severity of abnormal movements (highest score from questions above): None, normal Incapacitation due to abnormal movements: None, normal Patient's awareness of abnormal movements (rate only patient's report): No Awareness, Dental Status Current problems with teeth and/or dentures?: No Does patient usually wear dentures?: No  CIWA:    COWS:     Treatment Plan Summary: Daily contact with patient to assess and evaluate symptoms and progress in treatment Medication management  Plan: Review of chart, vital signs, medications, and notes.  1-Individual and group therapy  2-Medication management for depression and anxiety: Medications reviewed with the patient and she stated no untoward effects, unchanged. 3-Coping skills for depression, anxiety  4-Continue crisis stabilization and management  5-Address health issues--monitoring vital signs, stable  6-Treatment  plan in progress to prevent relapse of depression and anxiety  Medical Decision Making Problem Points:  Established problem, stable/improving (1), Review of last therapy session (1) and Review of psycho-social stressors (1) Data Points:  Review or order clinical lab tests (1) Review or order medicine tests (1) Review of medication regiment & side effects (2) Review of new medications or change in dosage (2)  I certify that inpatient services furnished can reasonably be expected to improve the patient's condition.   Benjamine Mola, FNP-BC 02/28/2014, 1:55 PM  Patient seen, evaluated and I agree with notes by Nurse Practitioner. Corena Pilgrim, MD

## 2014-02-28 NOTE — [facility] Group Notes (Signed)
[place] Group Notes:  (Clinical Social Work)  02/28/2014   1:15-2:15PM  Summary of Progress/Problems:   The main focus of today's process group was for the patient to identify ways in which they have sabotaged their own mental health wellness/recovery.  Motivational interviewing and a handout were used to explore the benefits and costs of their self-sabotaging behavior as well as the benefits and costs of changing this behavior.  The Stages of Change were explained to the group using a handout, and patients identified where they are with regard to changing self-defeating behaviors.  The patient expressed she self-sabotages with negative self-talk and being a people pleaser.  She was crunching ice during group which the CSW did not notice, causing the patient next to her to move across the room.  After this happened, she apologized and was commended by CSW.  However, she did not participate any further in the discussion, and left the room about 10 minutes later, did not return.  Type of Therapy:  Process Group  Participation Level:  Active  Participation Quality:  Attentive  Affect:  Defensive, Depressed and Flat  Cognitive:  Oriented  Insight:  Developing/Improving  Engagement in Therapy:  Limited  Modes of Intervention:  Education, Motivational Interviewing   Selmer Dominion, LCSW 02/28/2014, 4:00pm

## 2014-02-28 NOTE — Progress Notes (Signed)
Adult Psychoeducational Group Note  Date:  02/28/2014 Time:  11:06 PM  Group Topic/Focus:  Wrap-Up Group:   The focus of this group is to help patients review their daily goal of treatment and discuss progress on daily workbooks.  Participation Level:  Active  Participation Quality:  Appropriate  Affect:  Appropriate  Cognitive:  Appropriate  Insight: Appropriate  Engagement in Group:  Engaged  Modes of Intervention:  Discussion  Additional Comments:  Pt was present for wrap up group. She shred that she was able to detect a panic attack and take action (by asking her nurse for anxiety medication and taking a nap) before it got out of hand. This Probation officer commended her for learning to be in tune with her body. Her goal today was to learn new coping skills- she said she was having difficulty with this. This Probation officer asked her what she enjoys doing or what makes her happy. She had difficulty answering this. I asked her to list 20 things on paper that she likes before she goes to bed. This Probation officer followed up with her. She went to bed without making a list.    Harrie Foreman A 02/28/2014, 11:06 PM

## 2014-02-28 NOTE — Progress Notes (Signed)
D.  Pt pleasant on approach, positive for evening wrap up group.  Pt's husband placed mattress from other bed onto Pt's bed due to her painful arthritis.  Pt uses a specialty mattress at home.  Pt states she was nearly lame from bed this past morning.  Called doctor on call and got order for do not admit due to no mattress on other bed in room.  Interacting appropriately with peers on the unit.  A.   Support and encouragement offered  R.  Pt remains safe on unit, will continue to monitor.

## 2014-02-28 NOTE — Progress Notes (Signed)
.  Psychoeducational Group Note    Date: 02/28/2014 Time: 0930  Goal Setting Purpose of Group: To be able to set a goal that is measurable and that can be accomplished in one day Participation Level:  Active  Participation Quality:  Appropriate  Affect:  Appropriate  Cognitive:  Oriented  Insight:  Improving  Engagement in Group:  Engaged  Additional Comments:  Pt participated and was responsive in the group. Was able to set a goal.  Paulino Rily

## 2014-02-28 NOTE — Progress Notes (Signed)
Psychoeducational Group Note  Date: 02/28/2014 Time:  1015  Group Topic/Focus:  Identifying Needs:   The focus of this group is to help patients identify their personal needs that have been historically problematic and identify healthy behaviors to address their needs.  Participation Level:  Active  Participation Quality:  Attentive  Affect:  Appropriate  Cognitive:  Oriented  Insight:  Improving  Engagement in Group:  Engaged  Additional Comments:  Attended the group and partisapated  Paulino Rily

## 2014-02-28 NOTE — Progress Notes (Signed)
D) Pt approached this Probation officer and asked for "something for an anxiety attack. I am not sure why this is happening. It hasn't happened in a long time and I am trying to stop it before it gets any further along. Affect is flat and mood depressed. Rates her depression at a 7, hopelessness at a 5 and her anxiety at an 8. Has attended the program, but appears to be distracted and not involved in what is happening in the group. A) Pt given Zydis per Pt's request. And provided with a brief 1:1 where Pt expressed her concerns and wanted to avoid a panic attack. R) Pt denies SI and HI. Given support, reassurance and praise.

## 2014-03-01 MED ORDER — ENSURE PUDDING PO PUDG
1.0000 | Freq: Three times a day (TID) | ORAL | Status: DC
  Administered 2014-03-02 – 2014-03-03 (×4): 1 via ORAL
  Filled 2014-03-01 (×15): qty 1

## 2014-03-01 NOTE — [facility] Group Notes (Signed)
[place] Group Notes:  (Clinical Social Work)  03/01/2014   1:15-2:15PM  Summary of Progress/Problems:  The main focus of today's process group was to   identify the patient's current support system and decide on other supports that can be put in place.  The picture on workbook was used to discuss why additional supports are needed.  An emphasis was placed on using counselor, doctor, therapy groups, 12-step groups, and problem-specific support groups to expand supports.   There was also an extensive discussion about what constitutes a healthy support versus an unhealthy support.  The patient expressed full comprehension of the concepts presented.  She was late to group, and listened attentively but did not speak during group.  Type of Therapy:  Process Group  Participation Level:  Active  Participation Quality:  Attentive and Sharing  Affect:  Flat and Depressed  Cognitive:  Appropriate and Oriented  Insight:  Limited  Engagement in Therapy:  Limited  Modes of Intervention:  Education,  Support and [employer], LCSW 03/01/2014, 4:00pm

## 2014-03-01 NOTE — Progress Notes (Addendum)
Psychoeducational Group Note  Date: 03/01/2014 Time:  0930  Group Topic/Focus:  Gratefulness:  The focus of this group is to help patients identify what two things they are most grateful for in their lives. What helps ground them and to center them on their work to their recovery.  Participation Level:  Did not attendJudge, Elnora Morrison

## 2014-03-01 NOTE — Progress Notes (Signed)
D.  Pt pleasant on approach, decided to go to bed early after attending group this evening.  Denies SI/HI/hallucinations but does state she still struggles with self harm thoughts (Pt has history of cutting since age 53).  Pt does contract for safety on unit.  Interacting appropriately within milieu.  A.  Support and encouragement offered  R.  Pt remains safe on unit, will continue to monitor.

## 2014-03-01 NOTE — [facility] Group Notes (Signed)
Adult Psychoeducational Group Note  Date:  03/01/2014 Time:  5:13 PM  Group Topic/Focus:  Self Care:   The focus of this group is to help patients understand the importance of self-care in order to improve or restore emotional, physical, spiritual, interpersonal, and financial health.  Participation Level:  Active  Participation Quality:  Appropriate  Affect:  Appropriate  Cognitive:  Alert  Insight: Appropriate  Engagement in Group:  Engaged  Modes of Intervention:  Discussion  Additional Comments:  Pt attended group. Pt stated that she enjoyed doing crafts and being a part of colonial reenactments.  Pt stated her support group included her next door neighbor, her friend at work and her brother and his wife.   Jill Side, Lewi Drost G 03/01/2014, 5:13 PM

## 2014-03-01 NOTE — Progress Notes (Signed)
[place] Group Notes:  (Nursing/MHT/Case Management/Adjunct)  Date:  03/01/2014  Time:  9:29 PM  Type of Therapy:  Group Therapy  Participation Level:  Minimal  Participation Quality:  Appropriate  Affect:  Appropriate  Cognitive:  Appropriate  Insight:  Good  Engagement in Group:  Engaged  Modes of Intervention:  Discussion  Summary of Progress/Problems: Patient stated that she has had some high points and some low points today. Says that she had a visit with her husband and became a little depressed during the visit. Stated that the husband was going to return to work from disability and now that she is in the hospital, he doesn't want to go back. The husband is worried about her.  Donney Rankins 03/01/2014, 9:29 PM

## 2014-03-01 NOTE — Progress Notes (Signed)
Psychoeducational Group Note  Date:  03/01/2014 Time:  1015  Group Topic/Focus:  Making Healthy Choices:   The focus of this group is to help patients identify negative/unhealthy choices they were using prior to admission and identify positive/healthier coping strategies to replace them upon discharge.  Participation Level:  Active  Participation Quality:  Appropriate  Affect:  Flat  Cognitive:  Oriented  Insight:  Improving  Engagement in Group:  Engaged  Additional Comments:  Pt participated and was engaged with the discussion  Paulino Rily 03/01/2014

## 2014-03-01 NOTE — Progress Notes (Signed)
D) Pt wrote on her self inventory that she was not suicidal but underlined wanting to hurt herself. Pt continues to state that she feels that she is a burden to her family. When in a 1:1 asked "are you really a burden to your family?' Pt was able to state that she wasn't. "I just feel this hurt inside and it is easier to hurt on the outside than on the inside. This all began 6 months ago".  A) Provided Pt with a 1:1. Contact established with Pt for her safety and that she would not hurt herself on the unit and would come to staff.  R) Pt contracting for her safety on the unit and she is attending groups and interacting with her peers. Affect remains flat and mood depressed.

## 2014-03-01 NOTE — Progress Notes (Signed)
Patient ID: Jamie Bennett, female   DOB: August 20, 1960, 53 y.o.   MRN: [id number] [facility] MD Progress Note  03/01/2014 5:15 PM Jamie Bennett  MRN:  [id number] Subjective:  Pt seen and chart reviewed. Pt denies SI, HI, and AVH, contracts for safety. Pt reports that she is doing much better today but that she is having trouble keeping food down due to her history of gastric bypass surgery. Pt requested extra time to eat due to regurgitation (10 min given). Ensure ordered between meals as needed. Pt is reporting improvement from medications and group therapy and is asking about discharge early in the week.   HPI: Jamie Bennett is a 53 year old female admitted voluntarily to the adult unit post suicide attempt. Patient took 24 1 mg Clonazepam as an intentional overdose, patient reports is due to patient feeling like a burden to her family. Patient reports when she woke up she was angry because she was alive. Patient reports that she has no coping skills and that is why she tried to kill herself and cause self-inflicted scratches to her left arm. Patient has an extensive past medical history included but not limited to osteoporosis, hypotension, osteoarthritis, and anemia. Patient currently denies SI, HI and A/V hallucinations. Patient reports that she hopes she will learn coping skills here. Patient was encouraged to go to groups and think of herself while in the facility. Patient was oriented to the unit and verbalized understanding of the admission process. Q15 minute safety checks were initiated and are maintained. Patient is safe at this time.  Diagnosis:   DSM5: Depressive Disorders:  Major Depressive Disorder - Severe (296.23) Total Time spent with patient: 30 minutes  Axis I: Adjustment Disorder with Mixed Disturbance of Emotions and Conduct, Generalized Anxiety Disorder and Major Depression, Recurrent severe Axis II: Deferred Axis III:  Past Medical History  Diagnosis Date  . Osteoarthritis   .  Osteoporosis   . Rheumatoid arthritis   . Thyroid disease   . Hypotension   . Constipation   . Anemia   . Bipolar 1 disorder   . Anxiety    Axis IV: other psychosocial or environmental problems and problems related to social environment Axis V: 41-50 serious symptoms  ADL's:  Intact  Sleep: Good  Appetite:  Good  Suicidal Ideation:  Yes, contracts currently Homicidal Ideation:  Denies AEB (as evidenced by):  Psychiatric Specialty Exam: Physical Exam  Review of Systems  Constitutional: Negative.   HENT: Negative.   Eyes: Negative.   Respiratory: Negative.   Cardiovascular: Negative.   Gastrointestinal: Negative.   Genitourinary: Negative.   Musculoskeletal: Negative.   Skin: Negative.   Neurological: Negative.   Endo/Heme/Allergies: Negative.   Psychiatric/Behavioral: Positive for depression. The patient is nervous/anxious.     Blood pressure 126/84, pulse 82, temperature 98.2 F (36.8 C), temperature source Oral, resp. rate 20, height 5\' 4"  (1.626 m), weight 90.719 kg (200 lb).Body mass index is 34.31 kg/(m^2).  General Appearance: Fairly Groomed  Engineer, water::  Good  Speech:  Clear and Coherent  Volume:  Normal  Mood:  Anxious  Affect:  Appropriate  Thought Process:  Coherent and Goal Directed  Orientation:  Full (Time, Place, and Person)  Thought Content:  WDL  Suicidal Thoughts:  No  Homicidal Thoughts:  No  Memory:  Immediate;   Fair Recent;   Fair Remote;   Fair  Judgement:  Fair  Insight:  Fair  Psychomotor Activity:  Normal  Concentration:  Good  Recall:  Good  Fund of Knowledge:Good  Language: Good  Akathisia:  No  Handed:  Right  AIMS (if indicated):     Assets:  Communication Skills Desire for Improvement Financial Resources/Insurance Housing Leisure Time Resilience Social Support Transportation  Sleep:  Number of Hours: 6.75   Musculoskeletal: Strength & Muscle Tone: within normal limits Gait & Station: normal Patient leans:  N/A  Current Medications: Current Facility-Administered Medications  Medication Dose Route Frequency Provider Last Rate Last Dose  . acetaminophen (TYLENOL) tablet 650 mg  650 mg Oral Q6H PRN Clarene Reamer, MD   650 mg at 02/27/14 1517  . alum & mag hydroxide-simeth (MAALOX/MYLANTA) 200-200-20 MG/5ML suspension 30 mL  30 mL Oral Q4H PRN Clarene Reamer, MD      . benzocaine (ORAJEL) 10 % mucosal gel   Mouth/Throat QID PRN Malena Peer, NP      . clonazePAM (KLONOPIN) tablet 1 mg  1 mg Oral TID PRN Clarene Reamer, MD   1 mg at 03/01/14 0810  . feeding supplement (ENSURE) (ENSURE) pudding 1 Container  1 Container Oral TID BM Elyse Jarvis Withrow, FNP      . gabapentin (NEURONTIN) capsule 100 mg  100 mg Oral TID Benjamine Mola, FNP   100 mg at 03/01/14 1155  . ibuprofen (ADVIL,MOTRIN) tablet 600 mg  600 mg Oral Q8H PRN Malena Peer, NP   600 mg at 03/01/14 1504  . levothyroxine (SYNTHROID, LEVOTHROID) tablet 100 mcg  100 mcg Oral QAC breakfast Clarene Reamer, MD   100 mcg at 03/01/14 1941  . magnesium hydroxide (MILK OF MAGNESIA) suspension 30 mL  30 mL Oral Daily PRN Clarene Reamer, MD      . modafinil (PROVIGIL) tablet 200 mg  200 mg Oral BID Neita Garnet, MD   200 mg at 03/01/14 1504  . OLANZapine zydis (ZYPREXA) disintegrating tablet 5 mg  5 mg Oral Q8H PRN Benjamine Mola, FNP   5 mg at 03/01/14 0930  . ondansetron (ZOFRAN) tablet 4 mg  4 mg Oral Q8H PRN Clarene Reamer, MD      . pantoprazole (PROTONIX) EC tablet 80 mg  80 mg Oral Q1200 Benjamine Mola, FNP   80 mg at 03/01/14 1156  . traZODone (DESYREL) tablet 150 mg  150 mg Oral QHS Clarene Reamer, MD   150 mg at 02/28/14 2123  . venlafaxine XR (EFFEXOR-XR) 24 hr capsule 75 mg  75 mg Oral Daily Neita Garnet, MD   75 mg at 03/01/14 7408    Lab Results:  Results for orders placed during the hospital encounter of 02/26/14 (from the past 48 hour(s))  AMMONIA     Status: None   Collection Time    02/28/14  7:35 PM       Result Value Ref Range   Ammonia 17  11 - 60 umol/L   Comment: Performed at [facility]    Physical Findings: AIMS: Facial and Oral Movements Muscles of Facial Expression: None, normal Lips and Perioral Area: None, normal Jaw: None, normal Tongue: None, normal,Extremity Movements Upper (arms, wrists, hands, fingers): None, normal Lower (legs, knees, ankles, toes): None, normal, Trunk Movements Neck, shoulders, hips: None, normal, Overall Severity Severity of abnormal movements (highest score from questions above): None, normal Incapacitation due to abnormal movements: None, normal Patient's awareness of abnormal movements (rate only patient's report): No Awareness, Dental Status Current problems with teeth and/or dentures?: No Does patient usually wear dentures?: No  CIWA:    COWS:     Treatment Plan Summary: Daily contact with patient to assess and evaluate symptoms and progress in treatment Medication management  Plan: Review of chart, vital signs, medications, and notes.  1-Individual and group therapy  2-Medication management for depression and anxiety: Medications reviewed with the patient and she stated no untoward effects, unchanged. 3-Coping skills for depression, anxiety  4-Continue crisis stabilization and management  5-Address health issues--monitoring vital signs, stable  6-Treatment plan in progress to prevent relapse of depression and anxiety  Medical Decision Making Problem Points:  Established problem, stable/improving (1), Review of last therapy session (1) and Review of psycho-social stressors (1) Data Points:  Review or order clinical lab tests (1) Review or order medicine tests (1) Review of medication regiment & side effects (2) Review of new medications or change in dosage (2)  I certify that inpatient services furnished can reasonably be expected to improve the patient's condition.   Benjamine Mola, FNP-BC 03/01/2014, 5:15  PM  Patient seen, evaluated and I agree with notes by Nurse Practitioner. Corena Pilgrim, MD

## 2014-03-02 ENCOUNTER — Other Ambulatory Visit (HOSPITAL_COMMUNITY): Payer: 59 | Attending: Psychiatry | Admitting: Psychiatry

## 2014-03-02 DIAGNOSIS — M1990 Unspecified osteoarthritis, unspecified site: Secondary | ICD-10-CM | POA: Insufficient documentation

## 2014-03-02 DIAGNOSIS — I00 Rheumatic fever without heart involvement: Secondary | ICD-10-CM | POA: Insufficient documentation

## 2014-03-02 DIAGNOSIS — M0520 Rheumatoid vasculitis with rheumatoid arthritis of unspecified site: Secondary | ICD-10-CM | POA: Diagnosis present

## 2014-03-02 DIAGNOSIS — E079 Disorder of thyroid, unspecified: Secondary | ICD-10-CM | POA: Insufficient documentation

## 2014-03-02 DIAGNOSIS — Z609 Problem related to social environment, unspecified: Secondary | ICD-10-CM | POA: Insufficient documentation

## 2014-03-02 DIAGNOSIS — F411 Generalized anxiety disorder: Secondary | ICD-10-CM | POA: Insufficient documentation

## 2014-03-02 DIAGNOSIS — F410 Panic disorder [episodic paroxysmal anxiety] without agoraphobia: Secondary | ICD-10-CM | POA: Insufficient documentation

## 2014-03-02 DIAGNOSIS — M1610 Unilateral primary osteoarthritis, unspecified hip: Secondary | ICD-10-CM | POA: Insufficient documentation

## 2014-03-02 DIAGNOSIS — F329 Major depressive disorder, single episode, unspecified: Secondary | ICD-10-CM

## 2014-03-02 DIAGNOSIS — Z9884 Bariatric surgery status: Secondary | ICD-10-CM | POA: Insufficient documentation

## 2014-03-02 DIAGNOSIS — M810 Age-related osteoporosis without current pathological fracture: Secondary | ICD-10-CM | POA: Insufficient documentation

## 2014-03-02 DIAGNOSIS — E039 Hypothyroidism, unspecified: Secondary | ICD-10-CM | POA: Insufficient documentation

## 2014-03-02 DIAGNOSIS — D649 Anemia, unspecified: Secondary | ICD-10-CM | POA: Insufficient documentation

## 2014-03-02 DIAGNOSIS — F3130 Bipolar disorder, current episode depressed, mild or moderate severity, unspecified: Secondary | ICD-10-CM | POA: Diagnosis not present

## 2014-03-02 DIAGNOSIS — M1612 Unilateral primary osteoarthritis, left hip: Secondary | ICD-10-CM

## 2014-03-02 DIAGNOSIS — M069 Rheumatoid arthritis, unspecified: Secondary | ICD-10-CM | POA: Insufficient documentation

## 2014-03-02 MED ORDER — VENLAFAXINE HCL ER 75 MG PO CP24
112.5000 mg | ORAL_CAPSULE | Freq: Every day | ORAL | Status: DC
  Administered 2014-03-03 – 2014-03-04 (×2): 112.5 mg via ORAL
  Filled 2014-03-02 (×3): qty 1

## 2014-03-02 NOTE — Progress Notes (Signed)
Patient ID: Jamie Bennett, female   DOB: 1961/05/22, 53 y.o.   MRN: [id number] [facility] MD Progress Note  03/02/2014 2:46 PM Jamie Bennett  MRN:  [id number] Subjective: Patient is seen 1:1 and notes that she is "a little down today due to seeing her husband today, he is on short term disability." She says she feels guilty for being the one who kept him from going back to work. He has since returned to work and he says his stress is worse  because he is  working 80-90 hours a week.  She does state that  she is feeling better than upon admission       Objective: At this time denies any side effects. She feels Effexor XR has been helpful, but prefers Pristiq, and feels she will likely return to [place] after she sees her outpatient psychiatrist. She has been going to groups and behavior on unit has been in good control. She states she is " over wanting to isolate", and feels much better now that she knows that her husband is back to  working and  "watering her flowers" . 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 She is involved in [facility] groups, and is reaching out to friends that had fallen by the wayside with her work. She is also hoping to volunteer at a nursing home. States her meds seem to be working well. She seems less anhedonic than upon admission and exhibits a fuller range of affect.  Overall she says she is better than when she arrived, although she still reports depression and states " I don't feel close to my normal self yet".  At this time not endorsing side effects.    Diagnosis:  MDD   Total Time spent with patient: 25 minutes    ADL's:  Intact  Sleep: Good  Appetite:  Good  Suicidal Ideation:  Yes, contracts currently Homicidal Ideation:  Denies AEB (as evidenced by):  Psychiatric Specialty Exam: Physical Exam  Review of Systems  Constitutional: Negative.   HENT: Negative.   Eyes: Negative.    Respiratory: Negative.   Cardiovascular: Negative.   Gastrointestinal: Negative.   Genitourinary: Negative.   Musculoskeletal: Negative.   Skin: Negative.   Neurological: Negative.   Endo/Heme/Allergies: Negative.   Psychiatric/Behavioral: Positive for depression. The patient is nervous/anxious.     Blood pressure 123/94, pulse 96, temperature 99.2 F (37.3 C), temperature source Oral, resp. rate 18, height 5\' 4"  (1.626 m), weight 90.719 kg (200 lb).Body mass index is 34.31 kg/(m^2).  General Appearance: Fairly Groomed  Engineer, water::  Good  Speech:  Clear and Coherent  Volume:  Normal  Mood:  Depressed but improved compared to admission  Affect:  Less constricted   Thought Process:  Coherent and Goal Directed  Orientation:  Full (Time, Place, and Person)  Thought Content:  Denies hallucinations, no delusions   Suicidal Thoughts:  No- currently denies any suicidal ideations, or homicidal ideations. States " I am happy I am still alive"   Homicidal Thoughts:  No  Memory:  NA  Judgement:  Fair  Insight:  Fair  Psychomotor Activity:  Normal  Concentration:  Good  Recall:  Good  Fund of Knowledge:Good  Language: Good  Akathisia:  No  Handed:  Right  AIMS (if indicated):     Assets:  Communication Skills Desire for Improvement Financial Resources/Insurance Housing Leisure Time Resilience Social Support Transportation  Sleep:  Number of Hours: 6.5  Musculoskeletal: Strength & Muscle Tone: within normal limits Gait & Station: normal Patient leans: N/A  Current Medications: Current Facility-Administered Medications  Medication Dose Route Frequency Provider Last Rate Last Dose  . acetaminophen (TYLENOL) tablet 650 mg  650 mg Oral Q6H PRN Clarene Reamer, MD   650 mg at 02/27/14 1517  . alum & mag hydroxide-simeth (MAALOX/MYLANTA) 200-200-20 MG/5ML suspension 30 mL  30 mL Oral Q4H PRN Clarene Reamer, MD      . benzocaine (ORAJEL) 10 % mucosal gel   Mouth/Throat QID PRN  Malena Peer, NP      . clonazePAM (KLONOPIN) tablet 1 mg  1 mg Oral TID PRN Clarene Reamer, MD   1 mg at 03/01/14 0810  . feeding supplement (ENSURE) (ENSURE) pudding 1 Container  1 Container Oral TID BM Elyse Jarvis Withrow, FNP      . gabapentin (NEURONTIN) capsule 100 mg  100 mg Oral TID Benjamine Mola, FNP   100 mg at 03/02/14 1206  . ibuprofen (ADVIL,MOTRIN) tablet 600 mg  600 mg Oral Q8H PRN Malena Peer, NP   600 mg at 03/01/14 1504  . levothyroxine (SYNTHROID, LEVOTHROID) tablet 100 mcg  100 mcg Oral QAC breakfast Clarene Reamer, MD   100 mcg at 03/02/14 0603  . magnesium hydroxide (MILK OF MAGNESIA) suspension 30 mL  30 mL Oral Daily PRN Clarene Reamer, MD      . modafinil (PROVIGIL) tablet 200 mg  200 mg Oral BID Neita Garnet, MD   200 mg at 03/02/14 0818  . OLANZapine zydis (ZYPREXA) disintegrating tablet 5 mg  5 mg Oral Q8H PRN Benjamine Mola, FNP   5 mg at 03/02/14 0945  . ondansetron (ZOFRAN) tablet 4 mg  4 mg Oral Q8H PRN Clarene Reamer, MD      . pantoprazole (PROTONIX) EC tablet 80 mg  80 mg Oral Q1200 Benjamine Mola, FNP   80 mg at 03/02/14 1205  . traZODone (DESYREL) tablet 150 mg  150 mg Oral QHS Clarene Reamer, MD   150 mg at 03/01/14 2104  . venlafaxine XR (EFFEXOR-XR) 24 hr capsule 75 mg  75 mg Oral Daily Neita Garnet, MD   75 mg at 03/02/14 3810    Lab Results:  Results for orders placed during the hospital encounter of 02/26/14 (from the past 48 hour(s))  AMMONIA     Status: None   Collection Time    02/28/14  7:35 PM      Result Value Ref Range   Ammonia 17  11 - 60 umol/L   Comment: Performed at [facility]    Physical Findings: AIMS: Facial and Oral Movements Muscles of Facial Expression: None, normal Lips and Perioral Area: None, normal Jaw: None, normal Tongue: None, normal,Extremity Movements Upper (arms, wrists, hands, fingers): None, normal Lower (legs, knees, ankles, toes): None, normal, Trunk Movements Neck,  shoulders, hips: None, normal, Overall Severity Severity of abnormal movements (highest score from questions above): None, normal Incapacitation due to abnormal movements: None, normal Patient's awareness of abnormal movements (rate only patient's report): No Awareness, Dental Status Current problems with teeth and/or dentures?: No Does patient usually wear dentures?: No  CIWA:  CIWA-Ar Total: 2 COWS:     Assessment:  Patient is  Partially improved. She is no longer experiencing suicidal ideations and is more future oriented. She is less anhedonic. Improvement is partial, however, and she still feels depressed, anxious, and is ruminative about her  husband's  Current heavy work schedule being related to her illness . Tolerating medications well.  Treatment Plan Summary: Daily contact with patient to assess and evaluate symptoms and progress in treatment Medication management  Plan: Continue inpatient treatment Continue group therapy and milieu. Continue Neurontin 100 mgrs TID, Increase Effexor XR to 112.5 mgrs QDAY, Continue Provigil at 200 mgrs BID   Medical Decision Making Problem Points:  Established problem, stable/improving (1), Review of last therapy session (1) and Review of psycho-social stressors (1) Data Points:  Review or order clinical lab tests (1) Review of medication regiment & side effects (2) Review of new medications or change in dosage (2)  I certify that inpatient services furnished can reasonably be expected to improve the patient's condition.   Neita Garnet, FNP-BC 03/02/2014, 2:46 PM

## 2014-03-02 NOTE — Progress Notes (Signed)
D:  Patient's self inventory sheet, patient sleeps good, sleep medication does help her rest better.  Good appetite, normal energy level, good concentration.  Rated depression and hopeless 5, anxiety 8.  Denied withdrawals.  Denied SI.  Denied physical problems.  Worst pain #4 in past 24 hours, hips/hands.  Plans to learn coping skills, socialize.  No discharge plans.   A:  Medications administered per MD orders.  Emotional support and encouragement given patient. R:  Denied SI and HI.  Denied A/V hallucinations.  Safety maintained with 15 minute checks.

## 2014-03-02 NOTE — BHH Group Notes (Signed)
[place] LCSW Group Therapy          Overcoming Obstacles       1:15 -2:30        03/02/2014       Type of Therapy:  Group Therapy  Participation Level:  Appropriate  Participation Quality:  Appropriate  Affect:   Depressed  Cognitive:  Appropriate  Insight: Developing/Improving   Engagement in Therapy: Developing/Imprvoing  Modes of Intervention:  Discussion Exploration  Education Rapport BuildingProblem-Solving Support  Summary of Progress/Problems:  The main focus of today's group was overcoming obstacles.  She shared the obstacle she has to overcome is guilt.  She advised she feels like to burden to her family and has to find a way of overcoming that feeling. Patient able to identify appropriate coping skills.   Concha Pyo 03/02/2014

## 2014-03-02 NOTE — Progress Notes (Signed)
Adult Psychoeducational Group Note  Date:  03/02/2014 Time:  9:12 PM  Group Topic/Focus:  Wrap-Up Group:   The focus of this group is to help patients review their daily goal of treatment and discuss progress on daily workbooks.  Participation Level:  Active  Participation Quality:  Appropriate  Affect:  Appropriate  Cognitive:  Appropriate  Insight: Appropriate  Engagement in Group:  Engaged  Modes of Intervention:  Support  Additional Comments:  Pt stated that if she was a kitchen appliance that she would be a blender because of all her whirling and agitating thoughts. Pt stated that she is taking care of it all. Positive thing that happened was that she was able to have a visit with her husband and that he actually treated her like normal verse letting her win at [facility]. She stares walking with her husband helps her to be able to relax and their communication   Sherell Christoffel 03/02/2014, 9:12 PM

## 2014-03-02 NOTE — BHH Group Notes (Signed)
[facility] LCSW Aftercare Discharge Planning Group Note   03/02/2014 10:49 AM    Participation Quality:  Appropraite  Mood/Affect:  Appropriate  Depression Rating:  5  Anxiety Rating:  8  Thoughts of Suicide:  No  Will you contract for safety?   NA  Current AVH:  No  Plan for Discharge/Comments:  Patient attended discharge planning group and actively participated in group.  She advised of being better but not ready for discharge.  She is interested in returning to [place] at discharge.  CSW provided all participants with daily workbook.   Transportation Means: Patient has transportation.   Supports:  Patient has a support system.   Ronee Ranganathan, [name]

## 2014-03-03 ENCOUNTER — Other Ambulatory Visit (HOSPITAL_COMMUNITY): Payer: 59 | Admitting: Psychiatry

## 2014-03-03 MED ORDER — GABAPENTIN 100 MG PO CAPS
200.0000 mg | ORAL_CAPSULE | Freq: Two times a day (BID) | ORAL | Status: DC
  Administered 2014-03-04: 200 mg via ORAL
  Filled 2014-03-03: qty 12
  Filled 2014-03-03: qty 2
  Filled 2014-03-03: qty 12
  Filled 2014-03-03 (×2): qty 2

## 2014-03-03 NOTE — BHH Group Notes (Signed)
[place] LCSW Group Therapy      Feelings About Diagnosis 1:15 - 2:30 PM         03/03/2014    Type of Therapy:  Group Therapy  Participation Level: Minimal  Participation Quality:  Minimal  Affect:  Appropriate  Cognitive:   Appropriate  Insight:  Developing/Improving  Engagement in Therapy:  Developing/Improving  Modes of Intervention:  Discussion, Education, Exploration, Problem-Solving, Rapport Building, Support  Summary of Progress/Problems:  Patient entered group late and did not engage in the discussion.  Concha Pyo 03/03/2014

## 2014-03-03 NOTE — Progress Notes (Signed)
The focus of this group is to educate the patient on the purpose and policies of crisis stabilization and provide a format to answer questions about their admission.  The group details unit policies and expectations of patients while admitted.  Patient attended 0900 nurse education orientation group this morning.  Patient actively participated, appropriate affect, alert, appropriate insight and engagement.  Today patient will work on 3 goals for discharge.  

## 2014-03-03 NOTE — Progress Notes (Signed)
Patient ID: Jamie Bennett, female   DOB: 1960-12-09, 53 y.o.   MRN: [id number] [facility] MD Progress Note  03/03/2014 2:30 PM Jamie Bennett  MRN:  [id number] Subjective: Patient states she remains anxious, although better, and states her depression is abating and is not  Feeling " hopeless" any longer.       Objective: She denies side effects from her current medication regimen. She feels the Effexor XR is helping ( it was titrated yesterday to 112.5 mgrs QDAY). Husband has visited and states that visits have been going well. Patient states , as above, that overall she is better, and she is feeling less prone to " guilt" and self recrimination". She states " I feel more in control of my mood". She has been going to groups and behavior on unit has been in good control. She has been less isolative, more interactive.       Diagnosis:  MDD   Total Time spent with patient: 25 minutes    ADL's:  Intact  Sleep: Good  Appetite:  Good  Suicidal Ideation:  Yes, contracts currently Homicidal Ideation:  Denies AEB (as evidenced by):  Psychiatric Specialty Exam: Physical Exam  Review of Systems  Constitutional: Negative.   HENT: Negative.   Eyes: Negative.   Respiratory: Negative.   Cardiovascular: Negative.   Gastrointestinal: Negative.   Genitourinary: Negative.   Musculoskeletal: Negative.   Skin: Negative.   Neurological: Negative.   Endo/Heme/Allergies: Negative.   Psychiatric/Behavioral: Positive for depression. The patient is nervous/anxious.     Blood pressure 119/83, pulse 86, temperature 99.2 F (37.3 C), temperature source Oral, resp. rate 20, height 5\' 4"  (1.626 m), weight 90.719 kg (200 lb).Body mass index is 34.31 kg/(m^2).  General Appearance: Fairly Groomed  Engineer, water::  Good  Speech:  Clear and Coherent  Volume:  Normal  Mood:  Mood improving , less depressed  Affect:  Less constricted, more reactive  Thought Process:  Coherent and Goal Directed   Orientation:  Full (Time, Place, and Person)  Thought Content:  Denies hallucinations, no delusions   Suicidal Thoughts:  No- at this time  Not suicidal or homicidal   Homicidal Thoughts:  No  Memory:  NA  Judgement:  Fair  Insight:  Fair  Psychomotor Activity:  Normal  Concentration:  Good  Recall:  Good  Fund of Knowledge:Good  Language: Good  Akathisia:  No  Handed:  Right  AIMS (if indicated):     Assets:  Communication Skills Desire for Improvement Financial Resources/Insurance Housing Leisure Time Resilience Social Support Transportation  Sleep:  Number of Hours: 6.75   Musculoskeletal: Strength & Muscle Tone: within normal limits Gait & Station: normal Patient leans: N/A  Current Medications: Current Facility-Administered Medications  Medication Dose Route Frequency Provider Last Rate Last Dose  . acetaminophen (TYLENOL) tablet 650 mg  650 mg Oral Q6H PRN Clarene Reamer, MD   650 mg at 03/02/14 1835  . alum & mag hydroxide-simeth (MAALOX/MYLANTA) 200-200-20 MG/5ML suspension 30 mL  30 mL Oral Q4H PRN Clarene Reamer, MD      . benzocaine (ORAJEL) 10 % mucosal gel   Mouth/Throat QID PRN Malena Peer, NP      . clonazePAM (KLONOPIN) tablet 1 mg  1 mg Oral TID PRN Clarene Reamer, MD   1 mg at 03/03/14 0842  . feeding supplement (ENSURE) (ENSURE) pudding 1 Container  1 Container Oral TID BM Benjamine Mola, FNP   1 Container at 03/03/14  1426  . gabapentin (NEURONTIN) capsule 100 mg  100 mg Oral TID Benjamine Mola, FNP   100 mg at 03/03/14 1152  . ibuprofen (ADVIL,MOTRIN) tablet 600 mg  600 mg Oral Q8H PRN Malena Peer, NP   600 mg at 03/03/14 0842  . levothyroxine (SYNTHROID, LEVOTHROID) tablet 100 mcg  100 mcg Oral QAC breakfast Clarene Reamer, MD   100 mcg at 03/03/14 0645  . magnesium hydroxide (MILK OF MAGNESIA) suspension 30 mL  30 mL Oral Daily PRN Clarene Reamer, MD      . modafinil (PROVIGIL) tablet 200 mg  200 mg Oral BID Neita Garnet, MD    200 mg at 03/03/14 [phone]  . OLANZapine zydis (ZYPREXA) disintegrating tablet 5 mg  5 mg Oral Q8H PRN Benjamine Mola, FNP   5 mg at 03/03/14 1204  . ondansetron (ZOFRAN) tablet 4 mg  4 mg Oral Q8H PRN Clarene Reamer, MD      . pantoprazole (PROTONIX) EC tablet 80 mg  80 mg Oral Q1200 Benjamine Mola, FNP   80 mg at 03/03/14 1152  . traZODone (DESYREL) tablet 150 mg  150 mg Oral QHS Clarene Reamer, MD   150 mg at 03/02/14 2147  . venlafaxine XR (EFFEXOR-XR) 24 hr capsule 112.5 mg  112.5 mg Oral Daily Neita Garnet, MD   112.5 mg at 03/03/14 5366    Lab Results:  No results found for this or any previous visit (from the past 48 hour(s)).  Physical Findings: AIMS: Facial and Oral Movements Muscles of Facial Expression: None, normal Lips and Perioral Area: None, normal Jaw: None, normal Tongue: None, normal,Extremity Movements Upper (arms, wrists, hands, fingers): None, normal Lower (legs, knees, ankles, toes): None, normal, Trunk Movements Neck, shoulders, hips: None, normal, Overall Severity Severity of abnormal movements (highest score from questions above): None, normal Incapacitation due to abnormal movements: None, normal Patient's awareness of abnormal movements (rate only patient's report): No Awareness, Dental Status Current problems with teeth and/or dentures?: No Does patient usually wear dentures?: No  CIWA:  CIWA-Ar Total: 1 COWS:  COWS Total Score: 2  Assessment:  Patient continues to improve gradually and today she states she is less depressed, and feeling better about herself. Denies side effects ( currently on Neurontin and on Effexor XR) . She states she continues to have some anxiety, but at this time presents calm.   Treatment Plan Summary: Daily contact with patient to assess and evaluate symptoms and progress in treatment Medication management  Plan: Continue inpatient treatment Continue group therapy and milieu. Increase  Neurontin 200 mgrs BID, Continue   Effexor XR to 112.5 mgrs QDAY, Continue Provigil at 200 mgrs BID   Medical Decision Making Problem Points:  Established problem, stable/improving (1) and Review of psycho-social stressors (1) Data Points:  Review of medication regiment & side effects (2) Review of new medications or change in dosage (2)  I certify that inpatient services furnished can reasonably be expected to improve the patient's condition.   COBOS, Felicita Gage, FNP-BC 03/03/2014, 2:30 PM

## 2014-03-03 NOTE — Progress Notes (Signed)
Adult Psychoeducational Group Note  Date:  03/03/2014 Time:  9:51 PM  Group Topic/Focus:  Wrap-Up Group:   The focus of this group is to help patients review their daily goal of treatment and discuss progress on daily workbooks.  Participation Level:  Active  Participation Quality:  Appropriate  Affect:  Appropriate  Cognitive:  Appropriate  Insight: Appropriate  Engagement in Group:  Engaged  Modes of Intervention:  Discussion  Additional Comments:  Stayed out of her room to prevent from isolating. Goal was met  Teryl Lucy 03/03/2014, 9:51 PM

## 2014-03-03 NOTE — Progress Notes (Signed)
D:  Patient's self inventory sheet, patient sleeps good, sleep medication is helpful.  Good appetite, low energy level, good concentration.  Rated depression #6, hopeless 4, anxiety 6.  Denied withdrawals.  Denied SI.  Physical problems of arthritis pain and stiffness in left leg, hands, left arm.  Worst pain 7.  Stated she does not take pain medicine.  Goal to create coping skills and attend group.  Taking 400 mg of provigil and daily and feels tired during day.  No discharge plans.  No problems taking medications after discharge. A:  Medications administered per MD orders.  Emotional support and encouragement given patient. R:  Denied SI and HI.  Denied A/V hallucinations.  Safety maintained with 15 minute checks.

## 2014-03-03 NOTE — Progress Notes (Signed)
D: Patient denies SI/HI/AVH. Patient rates hopelessness as 5,  depression as 8, and anxiety as 8.  Patient affect is depressed. Mood is depressed.  Pt states, "I've had a down day today.  I've been fatigued and that may be why."  Patient did attend evening group. Patient visible on the milieu. No distress noted. A: Support and encouragement offered. Scheduled medications given to pt. Q 15 min checks continued for patient safety. R: Patient receptive. Patient remains safe on the unit.

## 2014-03-03 NOTE — Progress Notes (Signed)
D: Pt denies SI/HI/AVH. Pt is pleasant and cooperative. Pt goal for today is to work on her communication with her mother.  A: Pt was offered support and encouragement. Pt was given scheduled medications. Pt was encourage to attend groups. Q 15 minute checks were done for safety.   R:Pt attends groups and interacts well with peers and staff. Pt is taking medication. Pt has no complaints at this time .Pt receptive to treatment and safety maintained on unit.

## 2014-03-03 NOTE — Tx Team (Signed)
Interdisciplinary Treatment Plan Update   Date Reviewed:  03/03/2014  Time Reviewed:  9:42 AM  Progress in Treatment:   Attending groups: Yes Participating in groups: Yes Taking medication as prescribed: Yes  Tolerating medication: Yes Family/Significant other contact made:  No, but will ask patient for consent for collateral contact Patient understands diagnosis: Yes  Discussing patient identified problems/goals with staff: Yes Medical problems stabilized or resolved: Yes Denies suicidal/homicidal ideation: Yes Patient has not harmed self or others: Yes  For review of initial/current patient goals, please see plan of care.  Estimated Length of Stay:  1-2 days  Reasons for Continued Hospitalization:  Anxiety Depression Medication stabilization   New Problems/Goals identified:    Discharge Plan or Barriers:   Home with outpatient follow up with Dr. Toy Care  Additional Comments:  Attendees:  Patient:  03/03/2014 9:42 AM   Signature:  Gabriel Earing, MD 03/03/2014 9:42 AM  Signature:  03/03/2014 9:42 AM  Signature:  Grayland Ormond, RN 03/03/2014 9:42 AM  Signature:  Thurnell Garbe, RN 03/03/2014 9:42 AM  Signature:   03/03/2014 9:42 AM  Signature:  Joette Catching, LCSW 03/03/2014 9:42 AM  Signature:   03/03/2014 9:42 AM  Signature:  Jake Bathe Transition Team 03/03/2014 9:42 AM  Signature:  03/03/2014 9:42 AM  Signature:  03/03/2014  9:42 AM  Signature:   Lars Pinks, RN [facility] 03/03/2014  9:42 AM  Signature: 03/03/2014  9:42 AM    Scribe for Treatment Team:   Joette Catching,  03/03/2014 9:42 AM

## 2014-03-03 NOTE — Progress Notes (Signed)
Recreation Therapy Notes  Animal-Assisted Activity/Therapy (AAA/T) Program Checklist/Progress Notes Patient Eligibility Criteria Checklist & Daily Group note for Rec Tx Intervention  Date: 08.04.2015 Time: 3:15pm Location: [address]   AAA/T Program Assumption of Risk Form signed by Patient/ or Parent Legal Guardian yes  Patient is free of allergies or sever asthma yes  Patient reports no fear of animals yes  Patient reports no history of cruelty to animals yes   Patient understands his/her participation is voluntary yes  Patient washes hands before animal contact yes  Patient washes hands after animal contact yes  Behavioral Response: Engaged, Appropriate   Education: Hand Washing, Appropriate Animal Interaction   Education Outcome: Acknowledges understanding  Clinical Observations/Feedback: Patient interacted appropriately with therapy dog team and peers in session. Patient asked appropriate questions about therapy dog and his training, as well as shared stories about pets she has had in the past.   Lane Hacker, LRT/CTRS  Lane Hacker 03/03/2014 4:13 PM

## 2014-03-04 ENCOUNTER — Other Ambulatory Visit (HOSPITAL_COMMUNITY): Payer: 59

## 2014-03-04 MED ORDER — LEVOTHYROXINE SODIUM 100 MCG PO TABS: 100.0000 ug | ORAL_TABLET | Freq: Every day | ORAL | Status: DC

## 2014-03-04 MED ORDER — ESOMEPRAZOLE MAGNESIUM 40 MG PO CPDR: 40.0000 mg | DELAYED_RELEASE_CAPSULE | Freq: Every day | ORAL | Status: DC

## 2014-03-04 MED ORDER — GABAPENTIN 100 MG PO CAPS: 200.0000 mg | ORAL_CAPSULE | Freq: Two times a day (BID) | ORAL | Status: DC

## 2014-03-04 MED ORDER — URSODIOL 300 MG PO CAPS: 300.0000 mg | ORAL_CAPSULE | Freq: Four times a day (QID) | ORAL | Status: DC

## 2014-03-04 MED ORDER — ALENDRONATE SODIUM 70 MG PO TABS: 70.0000 mg | ORAL_TABLET | ORAL | Status: DC

## 2014-03-04 MED ORDER — VENLAFAXINE HCL ER 150 MG PO CP24: 150.0000 mg | ORAL_CAPSULE | Freq: Every day | ORAL | Status: DC

## 2014-03-04 MED ORDER — VENLAFAXINE HCL ER 150 MG PO CP24
150.0000 mg | ORAL_CAPSULE | Freq: Every day | ORAL | Status: DC
  Filled 2014-03-04: qty 3
  Filled 2014-03-04: qty 1

## 2014-03-04 MED ORDER — TRAZODONE HCL 150 MG PO TABS
150.0000 mg | ORAL_TABLET | Freq: Every day | ORAL | Status: DC
Start: 2014-03-04 — End: 2015-07-29

## 2014-03-04 MED ORDER — MODAFINIL 200 MG PO TABS: 200.0000 mg | ORAL_TABLET | Freq: Two times a day (BID) | ORAL | Status: DC

## 2014-03-04 MED ORDER — VITAMIN D3 10 MCG (400 UNIT) PO CAPS: 400.0000 [IU] | ORAL_CAPSULE | Freq: Every day | ORAL | Status: DC

## 2014-03-04 NOTE — BHH Suicide Risk Assessment (Signed)
Demographic Factors:  53 year old  Female, married, has one adult son, retired Pharmacist, hospital.  Total Time spent with patient: 30 minutes  Psychiatric Specialty Exam: Physical Exam  ROS  Blood pressure 111/79, pulse 89, temperature 98.6 F (37 C), temperature source Oral, resp. rate 17, height 5\' 4"  (1.626 m), weight 90.719 kg (200 lb).Body mass index is 34.31 kg/(m^2).  General Appearance: improved grooming  Eye Contact::  Good  Speech:  Normal Rate  Volume:  Normal  Mood:  improved, today denies depression  Affect:  Appropriate  Thought Process:  Goal Directed and Linear  Orientation:  NA- fully alert and attentive  Thought Content:  denies hallucinations, no delusions  Suicidal Thoughts:  No- currently denies any suicidal or homicidal ideations  Homicidal Thoughts:  No  Memory:  NA  Judgement:  Good  Insight:  Fair  Psychomotor Activity:  Normal  Concentration:  Good  Recall:  Good  Fund of Knowledge:Good  Language: Good  Akathisia:  Negative  Handed:  Right  AIMS (if indicated):     Assets:  Communication Skills Desire for Improvement Resilience  Sleep:  Number of Hours: 6.75    Musculoskeletal: Strength & Muscle Tone: within normal limits Gait & Station: normal Patient leans: N/A   Mental Status Per Nursing Assessment::   On Admission:  Self-harm thoughts;Self-harm behaviors  Current Mental Status by Physician: At this time patient is improved, with improved mood and fuller range of affect, no thought disorder, no SI or HI, no psychotic symptoms.  Loss Factors: Recent marital stress related to husband having anxiety and being out of work. This has improved, ashe is now back to work.   Historical Factors: History of Mood Disorder. One prior suicidal attempt by history  Risk Reduction Factors:   Sense of responsibility to family, Positive social support and Positive coping skills or problem solving skills  Continued Clinical Symptoms:  As above, mood  symptoms improved upon discharge, and no SI or HI or psychotic symptoms on discharge  Cognitive Features That Contribute To Risk:  No gross cognitive deficits noted upon discharge  Suicide Risk:  Mild:  Suicidal ideation of limited frequency, intensity, duration, and specificity.  There are no identifiable plans, no associated intent, mild dysphoria and related symptoms, good self-control (both objective and subjective assessment), few other risk factors, and identifiable protective factors, including available and accessible social support.  Discharge Diagnoses:   AXIS I:  Major Depression, Recurrent severe AXIS II:  Deferred AXIS III:   Past Medical History  Diagnosis Date  . Osteoarthritis   . Osteoporosis   . Rheumatoid arthritis   . Thyroid disease   . Hypotension   . Constipation   . Anemia   . Bipolar 1 disorder   . Anxiety    AXIS IV:  problems related to social environment AXIS V:  61-70 mild symptoms ( 60-65 upon discharge)   Plan Of Care/Follow-up recommendations:  Activity:  As tolerated Diet:  Regular Tests:  NA Other:  See below  Is patient on multiple antipsychotic therapies at discharge:  No   Has Patient had three or more failed trials of antipsychotic monotherapy by history:  No  Recommended Plan for Multiple Antipsychotic Therapies: NA  Patient returning home. She is in good spirits upon discharge. Patient has an established outpatient psychiatrist , Dr. Arva Chafe, at [facility], with whom she plans to follow up. Has an established PCP for medical management as needed, Dr. Chancy Milroy, at [facility] in  [place].   Amado Andal 03/04/2014, 9:51 AM

## 2014-03-04 NOTE — BHH Suicide Risk Assessment (Signed)
[facility] INPATIENT:  Family/Significant Other Suicide Prevention Education  Suicide Prevention Education:  Education Completed; Lillyanne Bradburn, Husband, [phone];  has been identified by the patient as the family member/significant other with whom the patient will be residing, and identified as the person(s) who will aid the patient in the event of a mental health crisis (suicidal ideations/suicide attempt).  With written consent from the patient, the family member/significant other has been provided the following suicide prevention education, prior to the and/or following the discharge of the patient.  The suicide prevention education provided includes the following:  Suicide risk factors  Suicide prevention and interventions  National Suicide Hotline telephone number  [facility] assessment telephone number  [facility] Emergency Assistance [place] and/or Residential Mobile Crisis Unit telephone number  Request made of family/significant other to:  Remove weapons (e.g., guns, rifles, knives), all items previously/currently identified as safety concern. Husband advised patient does not have access to weapons.     Remove drugs/medications (over-the-counter, prescriptions, illicit drugs), all items previously/currently identified as a safety concern.  The family member/significant other verbalizes understanding of the suicide prevention education information provided.  The family member/significant other agrees to remove the items of safety concern listed above.  Concha Pyo 03/04/2014, 12:19 PM

## 2014-03-04 NOTE — BHH Group Notes (Signed)
[facility] LCSW Aftercare Discharge Planning Group Note   03/04/2014 12:24 PM    Participation Quality:  Appropraite  Mood/Affect:  Appropriate  Depression Rating:  1  Anxiety Rating:  3  Thoughts of Suicide:  No  Will you contract for safety?   NA  Current AVH:  No  Plan for Discharge/Comments:  Patient attended discharge planning group and actively participated in group.  Patient advised she will not be able to return to [facility] at discharge.  She advised of having outpatient services with Dr. Toy Care.  CSW provided all participants with daily workbook.   Transportation Means: Patient has transportation.   Supports:  Patient has a support system.   Sadeel Fiddler, [name]

## 2014-03-04 NOTE — Progress Notes (Signed)
[facility] Adult Case Management Discharge Plan :  Will you be returning to the same living situation after discharge: Yes,  Patient is returning home with her husband At discharge, do you have transportation home?:Yes,  Patient to arrange transportation home. Do you have the ability to pay for your medications:Yes,  Patient is able to afford medications.  Release of information consent forms completed and in the chart;  Patient's signature needed at discharge.  Patient to Follow up at: Follow-up Information   Follow up with Dr. Toy Care On 03/09/2014. (Monday, March 09, 2014 at 3:15 PM.  Please ask about being scheduled with Nunzio Cobbs while at this appointment)    Contact information:   [place], [place]   [address]  [phone]      Patient denies SI/HI:   Patient no longer endorsing SI/HI or other thoughts of self harm.     Safety Planning and Suicide Prevention discussed: .Reviewed with all patients during discharge planning group   Lorrie Strauch, [name] 03/04/2014, 12:17 PM

## 2014-03-04 NOTE — Progress Notes (Signed)
Discharge Note: Discharge instructions/prescriptions/medication samples given to patient. Patient verbalized understanding of discharge instructions and prescriptions. Returned belongings to patient. Denies SI/HI/AVH. Patient d/c without incident to the lobby and transported self home; patient's car and keys were present here at the hospital.

## 2014-03-05 ENCOUNTER — Other Ambulatory Visit (HOSPITAL_COMMUNITY): Payer: 59

## 2014-03-06 ENCOUNTER — Other Ambulatory Visit (HOSPITAL_COMMUNITY): Payer: 59

## 2014-03-06 NOTE — Progress Notes (Signed)
Patient Discharge Instructions:  After Visit Summary (AVS):   Faxed to:  03/06/14 Psychiatric Admission Assessment Note:   Faxed to:  03/06/14 Suicide Risk Assessment - Discharge Assessment:   Faxed to:  03/06/14 Faxed/Sent to the Next Level Care provider:  03/06/14 Faxed to [facility] [place], 03/06/2014, 3:46 PM

## 2014-03-09 ENCOUNTER — Other Ambulatory Visit (HOSPITAL_COMMUNITY): Payer: 59

## 2014-03-10 ENCOUNTER — Other Ambulatory Visit (HOSPITAL_COMMUNITY): Payer: 59

## 2014-03-11 ENCOUNTER — Other Ambulatory Visit (HOSPITAL_COMMUNITY): Payer: 59

## 2014-03-12 ENCOUNTER — Other Ambulatory Visit (HOSPITAL_COMMUNITY): Payer: 59

## 2014-03-12 LAB — CBC CANCER CENTER
Basophil #: 0 x10 3/mm (ref 0.0–0.1)
Basophil %: 0.7 %
Eosinophil #: 0.1 x10 3/mm (ref 0.0–0.7)
Eosinophil %: 4.2 %
HCT: 26.7 % — ABNORMAL LOW (ref 35.0–47.0)
HGB: 8.3 g/dL — ABNORMAL LOW (ref 12.0–16.0)
Lymphocyte #: 1.5 x10 3/mm (ref 1.0–3.6)
Lymphocyte %: 45.8 %
MCH: 25.6 pg — ABNORMAL LOW (ref 26.0–34.0)
MCHC: 31.1 g/dL — ABNORMAL LOW (ref 32.0–36.0)
MCV: 82 fL (ref 80–100)
Monocyte #: 0.4 x10 3/mm (ref 0.2–0.9)
Monocyte %: 11.6 %
Neutrophil #: 1.2 x10 3/mm — ABNORMAL LOW (ref 1.4–6.5)
Neutrophil %: 37.7 %
Platelet: 277 x10 3/mm (ref 150–440)
RBC: 3.24 10*6/uL — ABNORMAL LOW (ref 3.80–5.20)
RDW: 15.5 % — ABNORMAL HIGH (ref 11.5–14.5)
WBC: 3.3 x10 3/mm — ABNORMAL LOW (ref 3.6–11.0)

## 2014-03-12 LAB — COMPREHENSIVE METABOLIC PANEL
Albumin: 3.3 g/dL — ABNORMAL LOW (ref 3.4–5.0)
Alkaline Phosphatase: 129 U/L — ABNORMAL HIGH
Anion Gap: 11 (ref 7–16)
BUN: 12 mg/dL (ref 7–18)
Bilirubin,Total: 0.2 mg/dL (ref 0.2–1.0)
Calcium, Total: 8.1 mg/dL — ABNORMAL LOW (ref 8.5–10.1)
Chloride: 102 mmol/L (ref 98–107)
Co2: 25 mmol/L (ref 21–32)
Creatinine: 0.8 mg/dL (ref 0.60–1.30)
EGFR (African American): >60
EGFR (Non-African Amer.): >60
Glucose: 89 mg/dL (ref 65–99)
Osmolality: 275 (ref 275–301)
Potassium: 5 mmol/L (ref 3.5–5.1)
SGOT(AST): 24 U/L (ref 15–37)
SGPT (ALT): 22 U/L
Sodium: 138 mmol/L (ref 136–145)
Total Protein: 6.8 g/dL (ref 6.4–8.2)

## 2014-03-12 LAB — FERRITIN: Ferritin (ARMC): 7 ng/mL — ABNORMAL LOW (ref 8–388)

## 2014-03-12 LAB — IRON AND TIBC
Iron Bind.Cap.(Total): 620 ug/dL — ABNORMAL HIGH (ref 250–450)
Iron Saturation: 6 %
Iron: 37 ug/dL — ABNORMAL LOW (ref 50–170)
Unbound Iron-Bind.Cap.: 583 ug/dL

## 2014-03-12 LAB — AMMONIA: Ammonia, Plasma: 13 mcmol/L (ref 11–32)

## 2014-03-12 NOTE — Discharge Summary (Signed)
Physician Discharge Summary Note  Patient:  Jamie Bennett is an 53 y.o., female MRN:  [id number] DOB:  [birth year] Patient phone:  [phone] (home)  Patient address:   [address] [place] [address],  Total Time spent with patient: 30 minutes  Date of Admission:  02/26/2014 Date of Discharge: 03/04/2014  Reason for Admission:  MDD with SI with OD attempt  Discharge Diagnoses: Principal Problem:   Bipolar 1 disorder, depressed Active Problems:   Rheumatoid arteritis   Osteoarthritis of left hip   Unspecified hypothyroidism   Psychiatric Specialty Exam: Physical Exam  Review of Systems  Constitutional: Negative.   HENT: Negative.   Eyes: Negative.   Respiratory: Negative.   Cardiovascular: Negative.   Gastrointestinal: Negative.   Genitourinary: Negative.   Musculoskeletal: Negative.   Skin: Negative.   Neurological: Negative.   Endo/Heme/Allergies: Negative.   Psychiatric/Behavioral: Positive for depression. The patient is nervous/anxious.     Blood pressure 111/79, pulse 89, temperature 98.6 F (37 C), temperature source Oral, resp. rate 17, height 5\' 4"  (1.626 m), weight 90.719 kg (200 lb).Body mass index is 34.31 kg/(m^2).   General Appearance: improved grooming   Eye Contact:: Good   Speech: Normal Rate   Volume: Normal   Mood: improved, today denies depression   Affect: Appropriate   Thought Process: Goal Directed and Linear   Orientation: NA- fully alert and attentive   Thought Content: denies hallucinations, no delusions   Suicidal Thoughts: No- currently denies any suicidal or homicidal ideations   Homicidal Thoughts: No   Memory: NA   Judgement: Good   Insight: Fair   Psychomotor Activity: Normal   Concentration: Good   Recall: Good   Fund of Knowledge:Good   Language: Good   Akathisia: Negative   Handed: Right   AIMS (if indicated):   Assets: Communication Skills  Desire for Improvement  Resilience   Sleep: Number of Hours:  6.75    Musculoskeletal:  Strength & Muscle Tone: within normal limits  Gait & Station: normal  Patient leans: N/A    DSM5:  AXIS I: Major Depression, Recurrent severe  AXIS II: Deferred  AXIS III:  Past Medical History   Diagnosis  Date   .  Osteoarthritis    .  Osteoporosis    .  Rheumatoid arthritis    .  Thyroid disease    .  Hypotension    .  Constipation    .  Anemia    .  Bipolar 1 disorder    .  Anxiety     AXIS IV: problems related to social environment  AXIS V: 61-70 mild symptoms ( 60-65 upon discharge   Level of Care:  OP  Hospital Course:  53 year old woman, who presented to the hospital for depression and suicidal attempt, by overdosing on about 24 Klonopins, and Later cutting herself in the forearm with a pen. 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 She states Overdose was impulsive, but serious, and that when she woke up after the overdose she was upset she had not died and that is when she cut her forearm. She then decided to seek psychiatric treatment.  During Hospitalization: Medications managed, psychoeducation, group and individual therapy. Pt currently denies SI, HI, and Psychosis. At discharge, pt rates anxiety and depression as moderate. Pt states that he does have a good supportive home environment and will followup with  outpatient treatment. Affirms agreement with medication regimen and discharge plan. Denies other physical and psychological concerns at time of discharge.    Consults:  None  Significant Diagnostic Studies:  None  Discharge Vitals:   Blood pressure 111/79, pulse 89, temperature 98.6 F (37 C), temperature source Oral, resp. rate 17, height 5\' 4"  (1.626 m), weight 90.719 kg (200 lb). Body mass index is 34.31 kg/(m^2). Lab Results:   No results found for this or any previous visit (from the past 72 hour(s)).  Physical  Findings: AIMS: Facial and Oral Movements Muscles of Facial Expression: None, normal Lips and Perioral Area: None, normal Jaw: None, normal Tongue: None, normal,Extremity Movements Upper (arms, wrists, hands, fingers): None, normal Lower (legs, knees, ankles, toes): None, normal, Trunk Movements Neck, shoulders, hips: None, normal, Overall Severity Severity of abnormal movements (highest score from questions above): None, normal Incapacitation due to abnormal movements: None, normal Patient's awareness of abnormal movements (rate only patient's report): No Awareness, Dental Status Current problems with teeth and/or dentures?: No Does patient usually wear dentures?: No  CIWA:  CIWA-Ar Total: 1 COWS:  COWS Total Score: 2  Psychiatric Specialty Exam: See Psychiatric Specialty Exam and Suicide Risk Assessment completed by Attending Physician prior to discharge.  Discharge destination:  Home  Is patient on multiple antipsychotic therapies at discharge:  No   Has Patient had three or more failed trials of antipsychotic monotherapy by history:  No  Recommended Plan for Multiple Antipsychotic Therapies: NA     Medication List    STOP taking these medications       ALPRAZolam 1 MG tablet  Commonly known as:  XANAX     clonazePAM 1 MG tablet  Commonly known as:  KLONOPIN     ESTROVEN PO     lactulose 10 GM/15ML solution  Commonly known as:  CHRONULAC     loratadine-pseudoephedrine 5-120 MG per tablet  Commonly known as:  CLARITIN-D 12-hour     MULTIVITAMIN PO     OLANZapine 10 MG tablet  Commonly known as:  ZYPREXA     VITAMIN B-12 IJ      TAKE these medications     Indication   acetaminophen 500 MG tablet  Commonly known as:  TYLENOL  Take 500 mg by mouth every 6 (six) hours as needed for mild pain.      alendronate 70 MG tablet  Commonly known as:  FOSAMAX  Take 1 tablet (70 mg total) by mouth once a week. Take with a full glass of water on an empty stomach.    Indication:  bone loss     esomeprazole 40 MG capsule  Commonly known as:  NEXIUM  Take 1 capsule (40 mg total) by mouth daily at 12 noon.   Indication:  Gastroesophageal Reflux Disease with Current Symptoms     gabapentin 100 MG capsule  Commonly known as:  NEURONTIN  Take 2 capsules (200 mg total) by mouth 2 (two) times daily.   Indication:  Neurogenic Pain     levothyroxine 100 MCG tablet  Commonly known as:  SYNTHROID, LEVOTHROID  Take 1 tablet (100 mcg total) by mouth daily before breakfast.   Indication:  Underactive Thyroid     modafinil 200 MG tablet  Commonly known as:  PROVIGIL  Take 1-2 tablets (200-400 mg total) by mouth 2 (two) times daily. 400 mg in the am and 200 mg at noon   Indication:  mood stabilization     sucralfate 1 G tablet  Commonly known as:  CARAFATE  Take 1 g by mouth 2 (two) times daily as needed (stomach pain).      traZODone 150 MG tablet  Commonly known as:  DESYREL  Take 1 tablet (150 mg total) by mouth at bedtime.   Indication:  Trouble Sleeping     ursodiol 300 MG capsule  Commonly known as:  ACTIGALL  Take 1 capsule (300 mg total) by mouth 4 (four) times daily.   Indication:  Closure of Some or All of the Major Bile Ducts, Take as directed by Primary Provider     venlafaxine XR 150 MG 24 hr capsule  Commonly known as:  EFFEXOR-XR  Take 1 capsule (150 mg total) by mouth daily.   Indication:  Major Depressive Disorder, mood stabilization     Vitamin D3 400 UNITS Caps  Take 400 Units by mouth daily.   Indication:  Vitamin deficiency           Follow-up Information   Follow up with Dr. Toy Care On 03/09/2014. (Monday, March 09, 2014 at 3:15 PM.  Please ask about being scheduled with Nunzio Cobbs while at this appointment)    Contact information:   [place], [place]   [id number]  [phone]      Follow-up recommendations:  Activity:  As tolerated Diet:  heart healthy with low sodium  Comments:  Take all  medications as prescribed. Keep all follow-up appointments as scheduled.  Do not consume alcohol or use illegal drugs while on prescription medications. Report any adverse effects from your medications to your primary care provider promptly.  In the event of recurrent symptoms or worsening symptoms, call 911, a crisis hotline, or go to the nearest emergency department for evaluation.   Total Discharge Time:  Greater than 30 minutes.  Signed: Benjamine Mola, FNP-BC 03/04/2014, 12:11 PM  Patient seen, Suicide Assessment Completed.  Disposition Plan Reviewed

## 2014-03-13 ENCOUNTER — Other Ambulatory Visit (HOSPITAL_COMMUNITY): Payer: 59

## 2014-03-16 ENCOUNTER — Other Ambulatory Visit (HOSPITAL_COMMUNITY): Payer: 59

## 2014-03-17 ENCOUNTER — Other Ambulatory Visit (HOSPITAL_COMMUNITY): Payer: 59

## 2014-03-18 ENCOUNTER — Other Ambulatory Visit (HOSPITAL_COMMUNITY): Payer: 59

## 2014-03-18 DIAGNOSIS — F4323 Adjustment disorder with mixed anxiety and depressed mood: Secondary | ICD-10-CM | POA: Diagnosis not present

## 2014-03-19 ENCOUNTER — Other Ambulatory Visit (HOSPITAL_COMMUNITY): Payer: 59

## 2014-03-20 ENCOUNTER — Other Ambulatory Visit (HOSPITAL_COMMUNITY): Payer: 59

## 2014-03-25 DIAGNOSIS — Z09 Encounter for follow-up examination after completed treatment for conditions other than malignant neoplasm: Secondary | ICD-10-CM | POA: Diagnosis not present

## 2014-03-25 DIAGNOSIS — M069 Rheumatoid arthritis, unspecified: Secondary | ICD-10-CM | POA: Diagnosis not present

## 2014-03-25 DIAGNOSIS — M25549 Pain in joints of unspecified hand: Secondary | ICD-10-CM | POA: Diagnosis not present

## 2014-03-25 DIAGNOSIS — M76899 Other specified enthesopathies of unspecified lower limb, excluding foot: Secondary | ICD-10-CM | POA: Diagnosis not present

## 2014-03-31 ENCOUNTER — Ambulatory Visit: Payer: Self-pay | Admitting: Hematology and Oncology

## 2014-04-15 ENCOUNTER — Ambulatory Visit: Payer: Self-pay | Admitting: Physician Assistant

## 2014-04-15 DIAGNOSIS — R109 Unspecified abdominal pain: Secondary | ICD-10-CM | POA: Diagnosis not present

## 2014-04-15 DIAGNOSIS — R3129 Other microscopic hematuria: Secondary | ICD-10-CM | POA: Diagnosis not present

## 2014-04-15 DIAGNOSIS — R319 Hematuria, unspecified: Secondary | ICD-10-CM | POA: Diagnosis not present

## 2014-04-16 ENCOUNTER — Ambulatory Visit: Payer: Self-pay | Admitting: Hematology and Oncology

## 2014-04-16 DIAGNOSIS — E538 Deficiency of other specified B group vitamins: Secondary | ICD-10-CM | POA: Diagnosis not present

## 2014-04-20 ENCOUNTER — Emergency Department: Payer: Self-pay | Admitting: Emergency Medicine

## 2014-04-20 DIAGNOSIS — N390 Urinary tract infection, site not specified: Secondary | ICD-10-CM | POA: Diagnosis not present

## 2014-04-20 DIAGNOSIS — R5381 Other malaise: Secondary | ICD-10-CM | POA: Diagnosis not present

## 2014-04-20 DIAGNOSIS — R55 Syncope and collapse: Secondary | ICD-10-CM | POA: Diagnosis not present

## 2014-04-20 DIAGNOSIS — R5383 Other fatigue: Secondary | ICD-10-CM | POA: Diagnosis not present

## 2014-04-20 LAB — CBC
HCT: 36 % (ref 35.0–47.0)
HGB: 11 g/dL — ABNORMAL LOW (ref 12.0–16.0)
MCH: 28 pg (ref 26.0–34.0)
MCHC: 30.7 g/dL — ABNORMAL LOW (ref 32.0–36.0)
MCV: 91 fL (ref 80–100)
Platelet: 249 10*3/uL (ref 150–440)
RBC: 3.94 10*6/uL (ref 3.80–5.20)
RDW: 23.6 % — ABNORMAL HIGH (ref 11.5–14.5)
WBC: 5.2 10*3/uL (ref 3.6–11.0)

## 2014-04-20 LAB — URINALYSIS, COMPLETE
Bilirubin,UR: NEGATIVE
Blood: NEGATIVE
Glucose,UR: NEGATIVE mg/dL (ref 0–75)
Ketone: NEGATIVE
Leukocyte Esterase: NEGATIVE
Nitrite: POSITIVE
Ph: 6 (ref 4.5–8.0)
Protein: NEGATIVE
RBC,UR: 1 /HPF (ref 0–5)
Specific Gravity: 1.01 (ref 1.003–1.030)
Squamous Epithelial: <1
WBC UR: 1 /HPF (ref 0–5)

## 2014-04-20 LAB — COMPREHENSIVE METABOLIC PANEL
Albumin: 3.4 g/dL (ref 3.4–5.0)
Alkaline Phosphatase: 107 U/L
Anion Gap: 11 (ref 7–16)
BUN: 17 mg/dL (ref 7–18)
Bilirubin,Total: 0.2 mg/dL (ref 0.2–1.0)
Calcium, Total: 8.6 mg/dL (ref 8.5–10.1)
Chloride: 111 mmol/L — ABNORMAL HIGH (ref 98–107)
Co2: 20 mmol/L — ABNORMAL LOW (ref 21–32)
Creatinine: 0.87 mg/dL (ref 0.60–1.30)
EGFR (African American): >60
EGFR (Non-African Amer.): >60
Glucose: 91 mg/dL (ref 65–99)
Osmolality: 284 (ref 275–301)
Potassium: 3.9 mmol/L (ref 3.5–5.1)
SGOT(AST): 24 U/L (ref 15–37)
SGPT (ALT): 14 U/L
Sodium: 142 mmol/L (ref 136–145)
Total Protein: 7.2 g/dL (ref 6.4–8.2)

## 2014-04-20 LAB — TROPONIN I: Troponin-I: <0.02 ng/mL

## 2014-04-28 DIAGNOSIS — N329 Bladder disorder, unspecified: Secondary | ICD-10-CM | POA: Diagnosis not present

## 2014-04-28 DIAGNOSIS — R109 Unspecified abdominal pain: Secondary | ICD-10-CM | POA: Diagnosis not present

## 2014-04-28 DIAGNOSIS — N390 Urinary tract infection, site not specified: Secondary | ICD-10-CM | POA: Diagnosis not present

## 2014-04-28 DIAGNOSIS — R599 Enlarged lymph nodes, unspecified: Secondary | ICD-10-CM | POA: Diagnosis not present

## 2014-04-30 ENCOUNTER — Ambulatory Visit: Payer: Self-pay | Admitting: Internal Medicine

## 2014-05-02 ENCOUNTER — Emergency Department: Payer: Self-pay | Admitting: Student

## 2014-05-02 DIAGNOSIS — I951 Orthostatic hypotension: Secondary | ICD-10-CM | POA: Diagnosis not present

## 2014-05-02 DIAGNOSIS — Z79899 Other long term (current) drug therapy: Secondary | ICD-10-CM | POA: Diagnosis not present

## 2014-05-02 DIAGNOSIS — Z7983 Long term (current) use of bisphosphonates: Secondary | ICD-10-CM | POA: Diagnosis not present

## 2014-05-02 DIAGNOSIS — R42 Dizziness and giddiness: Secondary | ICD-10-CM | POA: Diagnosis not present

## 2014-05-02 DIAGNOSIS — R000 Tachycardia, unspecified: Secondary | ICD-10-CM | POA: Diagnosis not present

## 2014-05-02 DIAGNOSIS — J069 Acute upper respiratory infection, unspecified: Secondary | ICD-10-CM | POA: Diagnosis not present

## 2014-05-02 LAB — URINALYSIS, COMPLETE
Bacteria: NONE SEEN
Bilirubin,UR: NEGATIVE
Blood: NEGATIVE
Glucose,UR: NEGATIVE mg/dL (ref 0–75)
Ketone: NEGATIVE
Leukocyte Esterase: NEGATIVE
Nitrite: POSITIVE
Ph: 6 (ref 4.5–8.0)
Protein: NEGATIVE
RBC,UR: <1 /HPF (ref 0–5)
Specific Gravity: 1.014 (ref 1.003–1.030)
Squamous Epithelial: NONE SEEN
WBC UR: 1 /HPF (ref 0–5)

## 2014-05-02 LAB — CBC
HCT: 33.3 % — ABNORMAL LOW (ref 35.0–47.0)
HGB: 10.6 g/dL — ABNORMAL LOW (ref 12.0–16.0)
MCH: 28.9 pg (ref 26.0–34.0)
MCHC: 31.8 g/dL — ABNORMAL LOW (ref 32.0–36.0)
MCV: 91 fL (ref 80–100)
Platelet: 197 10*3/uL (ref 150–440)
RBC: 3.67 10*6/uL — ABNORMAL LOW (ref 3.80–5.20)
RDW: 23 % — ABNORMAL HIGH (ref 11.5–14.5)
WBC: 4.7 10*3/uL (ref 3.6–11.0)

## 2014-05-02 LAB — BASIC METABOLIC PANEL
Anion Gap: 5 — ABNORMAL LOW (ref 7–16)
BUN: 20 mg/dL — ABNORMAL HIGH (ref 7–18)
Calcium, Total: 8.1 mg/dL — ABNORMAL LOW (ref 8.5–10.1)
Chloride: 110 mmol/L — ABNORMAL HIGH (ref 98–107)
Co2: 26 mmol/L (ref 21–32)
Creatinine: 0.86 mg/dL (ref 0.60–1.30)
EGFR (African American): >60
EGFR (Non-African Amer.): >60
Glucose: 100 mg/dL — ABNORMAL HIGH (ref 65–99)
Osmolality: 284 (ref 275–301)
Potassium: 4.5 mmol/L (ref 3.5–5.1)
Sodium: 141 mmol/L (ref 136–145)

## 2014-05-04 ENCOUNTER — Ambulatory Visit: Payer: Self-pay | Admitting: Hematology and Oncology

## 2014-05-04 DIAGNOSIS — F319 Bipolar disorder, unspecified: Secondary | ICD-10-CM | POA: Diagnosis not present

## 2014-05-04 DIAGNOSIS — I708 Atherosclerosis of other arteries: Secondary | ICD-10-CM | POA: Diagnosis not present

## 2014-05-04 DIAGNOSIS — N390 Urinary tract infection, site not specified: Secondary | ICD-10-CM | POA: Diagnosis not present

## 2014-05-04 DIAGNOSIS — F419 Anxiety disorder, unspecified: Secondary | ICD-10-CM | POA: Diagnosis not present

## 2014-05-04 DIAGNOSIS — M1990 Unspecified osteoarthritis, unspecified site: Secondary | ICD-10-CM | POA: Diagnosis not present

## 2014-05-04 DIAGNOSIS — K769 Liver disease, unspecified: Secondary | ICD-10-CM | POA: Diagnosis not present

## 2014-05-04 DIAGNOSIS — R52 Pain, unspecified: Secondary | ICD-10-CM | POA: Diagnosis not present

## 2014-05-04 DIAGNOSIS — M069 Rheumatoid arthritis, unspecified: Secondary | ICD-10-CM | POA: Diagnosis not present

## 2014-05-04 DIAGNOSIS — F329 Major depressive disorder, single episode, unspecified: Secondary | ICD-10-CM | POA: Diagnosis not present

## 2014-05-04 DIAGNOSIS — D649 Anemia, unspecified: Secondary | ICD-10-CM | POA: Diagnosis not present

## 2014-05-04 DIAGNOSIS — Z8781 Personal history of (healed) traumatic fracture: Secondary | ICD-10-CM | POA: Diagnosis not present

## 2014-05-04 DIAGNOSIS — M549 Dorsalgia, unspecified: Secondary | ICD-10-CM | POA: Diagnosis not present

## 2014-05-04 DIAGNOSIS — R531 Weakness: Secondary | ICD-10-CM | POA: Diagnosis not present

## 2014-05-04 DIAGNOSIS — R1030 Lower abdominal pain, unspecified: Secondary | ICD-10-CM | POA: Diagnosis not present

## 2014-05-04 DIAGNOSIS — E538 Deficiency of other specified B group vitamins: Secondary | ICD-10-CM | POA: Diagnosis not present

## 2014-05-04 DIAGNOSIS — R112 Nausea with vomiting, unspecified: Secondary | ICD-10-CM | POA: Diagnosis not present

## 2014-05-04 DIAGNOSIS — R599 Enlarged lymph nodes, unspecified: Secondary | ICD-10-CM | POA: Diagnosis not present

## 2014-05-04 DIAGNOSIS — D72819 Decreased white blood cell count, unspecified: Secondary | ICD-10-CM | POA: Diagnosis not present

## 2014-05-04 DIAGNOSIS — R630 Anorexia: Secondary | ICD-10-CM | POA: Diagnosis not present

## 2014-05-04 DIAGNOSIS — Z915 Personal history of self-harm: Secondary | ICD-10-CM | POA: Diagnosis not present

## 2014-05-04 DIAGNOSIS — Z9049 Acquired absence of other specified parts of digestive tract: Secondary | ICD-10-CM | POA: Diagnosis not present

## 2014-05-04 DIAGNOSIS — Z9884 Bariatric surgery status: Secondary | ICD-10-CM | POA: Diagnosis not present

## 2014-05-04 DIAGNOSIS — G479 Sleep disorder, unspecified: Secondary | ICD-10-CM | POA: Diagnosis not present

## 2014-05-04 DIAGNOSIS — E7220 Disorder of urea cycle metabolism, unspecified: Secondary | ICD-10-CM | POA: Diagnosis not present

## 2014-05-04 DIAGNOSIS — E039 Hypothyroidism, unspecified: Secondary | ICD-10-CM | POA: Diagnosis not present

## 2014-05-04 DIAGNOSIS — M818 Other osteoporosis without current pathological fracture: Secondary | ICD-10-CM | POA: Diagnosis not present

## 2014-05-04 DIAGNOSIS — I959 Hypotension, unspecified: Secondary | ICD-10-CM | POA: Diagnosis not present

## 2014-05-04 LAB — URINE CULTURE

## 2014-05-08 DIAGNOSIS — Z9884 Bariatric surgery status: Secondary | ICD-10-CM | POA: Diagnosis not present

## 2014-05-08 DIAGNOSIS — M79606 Pain in leg, unspecified: Secondary | ICD-10-CM | POA: Diagnosis not present

## 2014-05-08 DIAGNOSIS — I517 Cardiomegaly: Secondary | ICD-10-CM | POA: Diagnosis not present

## 2014-05-08 DIAGNOSIS — M25551 Pain in right hip: Secondary | ICD-10-CM | POA: Diagnosis not present

## 2014-05-08 DIAGNOSIS — F319 Bipolar disorder, unspecified: Secondary | ICD-10-CM | POA: Diagnosis not present

## 2014-05-08 DIAGNOSIS — M069 Rheumatoid arthritis, unspecified: Secondary | ICD-10-CM | POA: Diagnosis not present

## 2014-05-08 DIAGNOSIS — E039 Hypothyroidism, unspecified: Secondary | ICD-10-CM | POA: Diagnosis not present

## 2014-05-08 DIAGNOSIS — E538 Deficiency of other specified B group vitamins: Secondary | ICD-10-CM | POA: Diagnosis not present

## 2014-05-08 DIAGNOSIS — I959 Hypotension, unspecified: Secondary | ICD-10-CM | POA: Diagnosis not present

## 2014-05-08 DIAGNOSIS — R5383 Other fatigue: Secondary | ICD-10-CM | POA: Diagnosis not present

## 2014-05-08 DIAGNOSIS — M25552 Pain in left hip: Secondary | ICD-10-CM | POA: Diagnosis not present

## 2014-05-08 DIAGNOSIS — R531 Weakness: Secondary | ICD-10-CM | POA: Diagnosis not present

## 2014-05-12 DIAGNOSIS — E039 Hypothyroidism, unspecified: Secondary | ICD-10-CM | POA: Diagnosis not present

## 2014-05-12 DIAGNOSIS — D509 Iron deficiency anemia, unspecified: Secondary | ICD-10-CM | POA: Diagnosis not present

## 2014-05-12 DIAGNOSIS — E7220 Disorder of urea cycle metabolism, unspecified: Secondary | ICD-10-CM | POA: Diagnosis not present

## 2014-05-12 DIAGNOSIS — D519 Vitamin B12 deficiency anemia, unspecified: Secondary | ICD-10-CM | POA: Diagnosis not present

## 2014-05-12 DIAGNOSIS — E878 Other disorders of electrolyte and fluid balance, not elsewhere classified: Secondary | ICD-10-CM | POA: Diagnosis not present

## 2014-05-12 DIAGNOSIS — F4541 Pain disorder exclusively related to psychological factors: Secondary | ICD-10-CM | POA: Diagnosis not present

## 2014-05-14 DIAGNOSIS — R52 Pain, unspecified: Secondary | ICD-10-CM | POA: Diagnosis not present

## 2014-05-14 DIAGNOSIS — D649 Anemia, unspecified: Secondary | ICD-10-CM | POA: Diagnosis not present

## 2014-05-14 LAB — CBC CANCER CENTER
Basophil #: 0 x10 3/mm (ref 0.0–0.1)
Basophil %: 0.7 %
Eosinophil #: 0.4 x10 3/mm (ref 0.0–0.7)
Eosinophil %: 9.2 %
HCT: 36.8 % (ref 35.0–47.0)
HGB: 11.9 g/dL — ABNORMAL LOW (ref 12.0–16.0)
Lymphocyte #: 1.9 x10 3/mm (ref 1.0–3.6)
Lymphocyte %: 40.7 %
MCH: 29.3 pg (ref 26.0–34.0)
MCHC: 32.4 g/dL (ref 32.0–36.0)
MCV: 90 fL (ref 80–100)
Monocyte #: 0.5 x10 3/mm (ref 0.2–0.9)
Monocyte %: 11 %
Neutrophil #: 1.8 x10 3/mm (ref 1.4–6.5)
Neutrophil %: 38.4 %
Platelet: 242 x10 3/mm (ref 150–440)
RBC: 4.08 10*6/uL (ref 3.80–5.20)
RDW: 21.9 % — ABNORMAL HIGH (ref 11.5–14.5)
WBC: 4.7 x10 3/mm (ref 3.6–11.0)

## 2014-05-14 LAB — IRON AND TIBC
Iron Bind.Cap.(Total): 470 ug/dL — ABNORMAL HIGH (ref 250–450)
Iron Saturation: 23 %
Iron: 106 ug/dL (ref 50–170)
Unbound Iron-Bind.Cap.: 364 ug/dL

## 2014-05-14 LAB — FERRITIN: Ferritin (ARMC): 67 ng/mL (ref 8–388)

## 2014-05-20 DIAGNOSIS — M19041 Primary osteoarthritis, right hand: Secondary | ICD-10-CM | POA: Diagnosis not present

## 2014-05-20 DIAGNOSIS — M069 Rheumatoid arthritis, unspecified: Secondary | ICD-10-CM | POA: Diagnosis not present

## 2014-05-20 DIAGNOSIS — M7071 Other bursitis of hip, right hip: Secondary | ICD-10-CM | POA: Diagnosis not present

## 2014-05-20 DIAGNOSIS — M791 Myalgia: Secondary | ICD-10-CM | POA: Diagnosis not present

## 2014-05-26 DIAGNOSIS — E039 Hypothyroidism, unspecified: Secondary | ICD-10-CM | POA: Diagnosis not present

## 2014-05-26 DIAGNOSIS — F411 Generalized anxiety disorder: Secondary | ICD-10-CM | POA: Diagnosis not present

## 2014-05-26 DIAGNOSIS — K219 Gastro-esophageal reflux disease without esophagitis: Secondary | ICD-10-CM | POA: Diagnosis not present

## 2014-05-26 DIAGNOSIS — F4541 Pain disorder exclusively related to psychological factors: Secondary | ICD-10-CM | POA: Diagnosis not present

## 2014-05-26 DIAGNOSIS — M0579 Rheumatoid arthritis with rheumatoid factor of multiple sites without organ or systems involvement: Secondary | ICD-10-CM | POA: Diagnosis not present

## 2014-05-26 DIAGNOSIS — M810 Age-related osteoporosis without current pathological fracture: Secondary | ICD-10-CM | POA: Diagnosis not present

## 2014-05-31 ENCOUNTER — Ambulatory Visit: Payer: Self-pay | Admitting: Hematology and Oncology

## 2014-05-31 DIAGNOSIS — D649 Anemia, unspecified: Secondary | ICD-10-CM | POA: Diagnosis not present

## 2014-06-16 DIAGNOSIS — M79675 Pain in left toe(s): Secondary | ICD-10-CM | POA: Diagnosis not present

## 2014-06-16 DIAGNOSIS — M810 Age-related osteoporosis without current pathological fracture: Secondary | ICD-10-CM | POA: Diagnosis not present

## 2014-06-16 DIAGNOSIS — M79674 Pain in right toe(s): Secondary | ICD-10-CM | POA: Diagnosis not present

## 2014-06-16 DIAGNOSIS — M19079 Primary osteoarthritis, unspecified ankle and foot: Secondary | ICD-10-CM | POA: Diagnosis not present

## 2014-06-16 DIAGNOSIS — B351 Tinea unguium: Secondary | ICD-10-CM | POA: Diagnosis not present

## 2014-06-30 ENCOUNTER — Ambulatory Visit: Payer: Self-pay | Admitting: Hematology and Oncology

## 2014-07-31 ENCOUNTER — Ambulatory Visit: Payer: Self-pay | Admitting: Hematology and Oncology

## 2014-08-14 DIAGNOSIS — F329 Major depressive disorder, single episode, unspecified: Secondary | ICD-10-CM | POA: Diagnosis not present

## 2014-08-14 DIAGNOSIS — D649 Anemia, unspecified: Secondary | ICD-10-CM | POA: Diagnosis not present

## 2014-08-14 DIAGNOSIS — M791 Myalgia: Secondary | ICD-10-CM | POA: Diagnosis not present

## 2014-08-14 DIAGNOSIS — E538 Deficiency of other specified B group vitamins: Secondary | ICD-10-CM | POA: Diagnosis not present

## 2014-08-14 LAB — CBC CANCER CENTER
Basophil #: 0 x10 3/mm (ref 0.0–0.1)
Basophil %: 0.4 %
Eosinophil #: 0.2 x10 3/mm (ref 0.0–0.7)
Eosinophil %: 3.4 %
HCT: 39 % (ref 35.0–47.0)
HGB: 12.7 g/dL (ref 12.0–16.0)
Lymphocyte #: 2.3 x10 3/mm (ref 1.0–3.6)
Lymphocyte %: 43.1 %
MCH: 31.8 pg (ref 26.0–34.0)
MCHC: 32.7 g/dL (ref 32.0–36.0)
MCV: 97 fL (ref 80–100)
Monocyte #: 0.4 x10 3/mm (ref 0.2–0.9)
Monocyte %: 6.5 %
Neutrophil #: 2.5 x10 3/mm (ref 1.4–6.5)
Neutrophil %: 46.6 %
Platelet: 258 x10 3/mm (ref 150–440)
RBC: 4.01 10*6/uL (ref 3.80–5.20)
RDW: 14.3 % (ref 11.5–14.5)
WBC: 5.4 x10 3/mm (ref 3.6–11.0)

## 2014-08-14 LAB — BASIC METABOLIC PANEL
Anion Gap: 11 (ref 7–16)
BUN: 8 mg/dL (ref 7–18)
Calcium, Total: 8.8 mg/dL (ref 8.5–10.1)
Chloride: 107 mmol/L (ref 98–107)
Co2: 23 mmol/L (ref 21–32)
Creatinine: 0.83 mg/dL (ref 0.60–1.30)
EGFR (African American): >60
EGFR (Non-African Amer.): >60
Glucose: 107 mg/dL — ABNORMAL HIGH (ref 65–99)
Osmolality: 280 (ref 275–301)
Potassium: 4.6 mmol/L (ref 3.5–5.1)
Sodium: 141 mmol/L (ref 136–145)

## 2014-08-31 ENCOUNTER — Ambulatory Visit: Payer: Self-pay | Admitting: Hematology and Oncology

## 2014-09-01 DIAGNOSIS — M25561 Pain in right knee: Secondary | ICD-10-CM | POA: Diagnosis not present

## 2014-09-29 ENCOUNTER — Emergency Department: Payer: Self-pay | Admitting: Emergency Medicine

## 2014-09-29 DIAGNOSIS — S41112A Laceration without foreign body of left upper arm, initial encounter: Secondary | ICD-10-CM | POA: Diagnosis not present

## 2014-09-29 DIAGNOSIS — T1491 Suicide attempt: Secondary | ICD-10-CM | POA: Diagnosis not present

## 2014-09-29 DIAGNOSIS — F332 Major depressive disorder, recurrent severe without psychotic features: Secondary | ICD-10-CM | POA: Diagnosis not present

## 2014-09-29 DIAGNOSIS — R45851 Suicidal ideations: Secondary | ICD-10-CM | POA: Diagnosis not present

## 2014-09-29 DIAGNOSIS — F329 Major depressive disorder, single episode, unspecified: Secondary | ICD-10-CM | POA: Diagnosis not present

## 2014-09-30 DIAGNOSIS — F332 Major depressive disorder, recurrent severe without psychotic features: Secondary | ICD-10-CM | POA: Diagnosis not present

## 2014-10-09 DIAGNOSIS — F319 Bipolar disorder, unspecified: Secondary | ICD-10-CM | POA: Diagnosis not present

## 2014-10-10 DIAGNOSIS — F3181 Bipolar II disorder: Secondary | ICD-10-CM | POA: Diagnosis not present

## 2014-10-12 ENCOUNTER — Inpatient Hospital Stay: Payer: Self-pay | Admitting: Psychiatry

## 2014-10-12 DIAGNOSIS — M069 Rheumatoid arthritis, unspecified: Secondary | ICD-10-CM | POA: Diagnosis present

## 2014-10-12 DIAGNOSIS — Z915 Personal history of self-harm: Secondary | ICD-10-CM | POA: Diagnosis not present

## 2014-10-12 DIAGNOSIS — Z818 Family history of other mental and behavioral disorders: Secondary | ICD-10-CM | POA: Diagnosis not present

## 2014-10-12 DIAGNOSIS — Z9884 Bariatric surgery status: Secondary | ICD-10-CM | POA: Diagnosis not present

## 2014-10-12 DIAGNOSIS — R400 Somnolence: Secondary | ICD-10-CM | POA: Diagnosis present

## 2014-10-12 DIAGNOSIS — F332 Major depressive disorder, recurrent severe without psychotic features: Secondary | ICD-10-CM | POA: Diagnosis not present

## 2014-10-12 DIAGNOSIS — Z9049 Acquired absence of other specified parts of digestive tract: Secondary | ICD-10-CM | POA: Diagnosis not present

## 2014-10-12 DIAGNOSIS — F331 Major depressive disorder, recurrent, moderate: Secondary | ICD-10-CM | POA: Diagnosis present

## 2014-10-12 DIAGNOSIS — E039 Hypothyroidism, unspecified: Secondary | ICD-10-CM | POA: Diagnosis present

## 2014-10-12 DIAGNOSIS — F603 Borderline personality disorder: Secondary | ICD-10-CM | POA: Diagnosis not present

## 2014-10-12 DIAGNOSIS — G4700 Insomnia, unspecified: Secondary | ICD-10-CM | POA: Diagnosis present

## 2014-10-12 DIAGNOSIS — F3181 Bipolar II disorder: Secondary | ICD-10-CM | POA: Diagnosis present

## 2014-10-12 DIAGNOSIS — E538 Deficiency of other specified B group vitamins: Secondary | ICD-10-CM | POA: Diagnosis present

## 2014-10-26 DIAGNOSIS — S0990XA Unspecified injury of head, initial encounter: Secondary | ICD-10-CM | POA: Diagnosis not present

## 2014-10-26 DIAGNOSIS — S20211A Contusion of right front wall of thorax, initial encounter: Secondary | ICD-10-CM | POA: Diagnosis not present

## 2014-10-26 DIAGNOSIS — S161XXA Strain of muscle, fascia and tendon at neck level, initial encounter: Secondary | ICD-10-CM | POA: Diagnosis not present

## 2014-10-26 DIAGNOSIS — R109 Unspecified abdominal pain: Secondary | ICD-10-CM | POA: Diagnosis not present

## 2014-10-26 DIAGNOSIS — R079 Chest pain, unspecified: Secondary | ICD-10-CM | POA: Diagnosis not present

## 2014-10-26 DIAGNOSIS — S199XXA Unspecified injury of neck, initial encounter: Secondary | ICD-10-CM | POA: Diagnosis not present

## 2014-10-26 DIAGNOSIS — S301XXA Contusion of abdominal wall, initial encounter: Secondary | ICD-10-CM | POA: Diagnosis not present

## 2014-10-26 DIAGNOSIS — S0003XA Contusion of scalp, initial encounter: Secondary | ICD-10-CM | POA: Diagnosis not present

## 2014-10-26 DIAGNOSIS — T1490 Injury, unspecified: Secondary | ICD-10-CM | POA: Diagnosis not present

## 2014-10-27 DIAGNOSIS — S0990XA Unspecified injury of head, initial encounter: Secondary | ICD-10-CM | POA: Diagnosis not present

## 2014-10-27 DIAGNOSIS — R109 Unspecified abdominal pain: Secondary | ICD-10-CM | POA: Diagnosis not present

## 2014-10-27 DIAGNOSIS — S0003XA Contusion of scalp, initial encounter: Secondary | ICD-10-CM | POA: Diagnosis not present

## 2014-10-27 DIAGNOSIS — R079 Chest pain, unspecified: Secondary | ICD-10-CM | POA: Diagnosis not present

## 2014-10-27 DIAGNOSIS — S199XXA Unspecified injury of neck, initial encounter: Secondary | ICD-10-CM | POA: Diagnosis not present

## 2014-11-02 DIAGNOSIS — F332 Major depressive disorder, recurrent severe without psychotic features: Secondary | ICD-10-CM | POA: Diagnosis not present

## 2014-11-12 DIAGNOSIS — F332 Major depressive disorder, recurrent severe without psychotic features: Secondary | ICD-10-CM | POA: Diagnosis not present

## 2014-11-13 ENCOUNTER — Ambulatory Visit
Admit: 2014-11-13 | Disposition: A | Discharge status: Home / Self Care | Payer: Self-pay | Attending: Hematology and Oncology | Admitting: Hematology and Oncology

## 2014-11-13 DIAGNOSIS — D649 Anemia, unspecified: Secondary | ICD-10-CM | POA: Diagnosis not present

## 2014-11-13 DIAGNOSIS — E538 Deficiency of other specified B group vitamins: Secondary | ICD-10-CM | POA: Diagnosis not present

## 2014-11-19 DIAGNOSIS — M25531 Pain in right wrist: Secondary | ICD-10-CM | POA: Diagnosis not present

## 2014-11-19 DIAGNOSIS — M19041 Primary osteoarthritis, right hand: Secondary | ICD-10-CM | POA: Diagnosis not present

## 2014-11-19 DIAGNOSIS — M25571 Pain in right ankle and joints of right foot: Secondary | ICD-10-CM | POA: Diagnosis not present

## 2014-11-19 DIAGNOSIS — M0579 Rheumatoid arthritis with rheumatoid factor of multiple sites without organ or systems involvement: Secondary | ICD-10-CM | POA: Diagnosis not present

## 2014-11-21 NOTE — Consult Note (Signed)
PATIENT NAME:  Jamie Bennett, Jamie Bennett MR#:  [id number] DATE OF BIRTH:  08-Sep-1960  DATE OF CONSULTATION:  01/28/2014  REFQUESTING PHYSICIAN:  Dr. Bridgette Habermann   CONSULTING PHYSICIAN:  Cheral Marker. Ola Spurr, MD  REASON FOR CONSULTATION:  Shingles.   HISTORY OF PRESENT ILLNESS: This is a pleasant 54 year old female with history of psychiatric illness who also has a history of alcohol abuse. She was admitted through the Emergency Room for worsening depression. She, while was inpatient, found to have an outbreak on her back and right chest that is consistent with zoster. She was admitted to the hospitalist service and started on Valtrex .  She is on isolation. We are consulted for further evaluation and management.   PAST MEDICAL HISTORY:  1. Depression.  2. Bipolar disorder.  3. Rheumatoid arthritis on room air.  4. Hypothyroidism.  5. Hepatic encephalopathy.  6. Cirrhosis.   SOCIAL HISTORY:  1. Gastric bypass surgery.  2. History of bile duct stenting.  3. Cholecystectomy.   SOCIAL HISTORY: Apparently, she was an alcoholic. No tobacco use.  She lives with her husband.   FAMILY HISTORY: Positive for Parkinson's and diabetes.   ALLERGIES: No known drug allergies.   MEDICATIONS: Outpatient medications are reviewed.  She was on Humira monthly.  Finally, she was also on Valtrex.   REVIEW OF SYSTEMS:  Eleven systems reviewed and negative except as per HPI.   PHYSICAL EXAMINATION:  VITAL SIGNS: Temperature 98.7, pulse 67, blood pressure 126/84, respirations 18, saturations 95% on room air.  GENERAL: She is pleasant. She is somewhat confused and anxious-appearing.  HEENT: Pupils equal, round, and reactive to light and accommodation. Extraocular movements are intact. Sclerae anicteric. Oropharynx clear.  NECK: Supple.  HEART: Regular.  LUNGS: Clear to auscultation.  ABDOMEN: Soft, nontender, nondistended.  EXTREMITIES: No clubbing, cyanosis or edema.  SKIN: She has a dermatomal eruption of a  small vessel  <<MISSING TEXT>>  on her right  and her thoracic dermatome. She also has on her right posterior neck a small lymph node that is slightly tender <<MISSING TEXT>>.   LABORATORY DATA: White blood count is 5.1, hemoglobin 10.2, platelets of 263,000.   Urinalysis is limited to do VZV. IgM antibodies are negative.   IMPRESSION: A 54 year old with chronic psychiatric illness as well as rheumatoid arthritis on Humira admitted with dermatomal zoster. She is considered immunocompromised because of the Humira.  According to CDC guidelines, immunocompromised patients even with just 1 dermatome of shingles should remain isolated.  This would include airborne isolation.  However, patient will need transfer back to the psychiatric unit once stable. The fact that her IgM is negative does not at all rule out that this is an eruption of zoster. We can await cultures that were sent, but this is undoubtedly shingles.   RECOMMENDATIONS:  1. Continue Valtrex.  2. Treat neuropathic pain with gabapentin and pain medications as you are doing. I discussed with infection prevention how to manage this patient as she obviously needs inpatient psychiatric treatment.  It is my opinion she is relatively low risk of transmission.  3. Thank you for the consult. I would be glad to follow with you.    ____________________________ Cheral Marker. Ola Spurr, MD dpf:dd/am D: 01/28/2014 21:12:30 ET T: 01/29/2014 02:28:03 ET JOB#: [id number]  cc: Cheral Marker. Ola Spurr, MD, <Dictator>

## 2014-11-21 NOTE — H&P (Signed)
PATIENT NAME:  Jamie Bennett, BOSSERMAN MR#:  [id number] DATE OF BIRTH:  1960/09/27  DATE OF ADMISSION:  01/27/2014  NOTE:  We were asked to consult on a patient at behavioral medicine, but we will admit the patient to our service.    REFERRING PHYSICIAN: Psychiatry Dr. Bary Leriche.  PRIMARY CARE PHYSICIAN:  Dr. Clayborn Bigness.  CHIEF COMPLAINT: Rule out shingles.  HISTORY OF PRESENT ILLNESS:  The patient is a pleasant 54 year old Caucasian female who was actually under behavioral medicine service, involuntarily committed for bipolar depression. I was asked to see if she has shingles, Per patient, she started to have a rash under and around the breast area on the right, moving towards the flank and also on the back.  She first noticed this is earlier this morning and, since then, it has doubled in size. It is itchy, it is burning, and it is painful to touch. She, of note, has rheumatoid arthritis, gastric bypass, bipolar depression, and is on Humira for the rheumatoid arthritis. She has no fevers.   PAST MEDICAL HISTORY: Rheumatoid arthritis on Humira, depression, bipolar, hypothyroidism, gastric bypass surgery, history of hepatic encephalopathy, and liver disease.   SURGICAL HISTORY: History of bile duct stenting in the past, cholecystectomy, and gastric bypass surgery.   SOCIAL HISTORY: Denies alcohol. She states she has quit for a couple of months now alcohol.  No tobacco or drug use.   FAMILY HISTORY: Parkinson's in her father, as well as diabetes in her father.   ALLERGIES: NO KNOWN DRUG ALLERGIES.   OUTPATIENT MEDICATIONS: Alendronate 70 mg daily, cholecalciferol 4000 units once a day, clonazepam 0.5 mg 4 times a day, Humira monthly, lactulose 20 mL 2 times a day, levothyroxine 100 mcg daily, loratadine 10 mg 1/2 tablet every 12 hours, multivitamin 1 tablet daily, Nexium 40 mg once a day, Nuvigil 250 mg once a day, Pristiq 50 mg extended-release once a day, sucralfate 1 gram 2 times a day as  needed, ursodiol 600 mg 2 times a day for liver disease, and vitamin B12 1000 mcg injectable once a month.  REVIEW OF SYSTEMS:  CONSTITUTIONAL: Positive for fatigue.  EYES: No blurry vision or double vision.  ENT: No tinnitus or hearing loss.  RESPIRATORY: No cough, wheezing, or shortness of breath.  CARDIOVASCULAR: No chest pain or palpitations. No swelling in the legs. Occasional dizziness.  RESPIRATORY: No cough or wheezing.  GASTROINTESTINAL: No nausea, vomiting, diarrhea or abdominal pain. Has loose stools because of the lactulose.   Has cirrhosis.  GENITOURINARY: Denies dysuria, hematuria.  LYMPHATIC: No anemia.  SKIN: She does have a rash on her upper extremities from itching herself.  It is her habit to scratch and itch herself if she is anxious.  It is her coping mechanism.  MUSCULOSKELETAL: Rheumatoid arthritis.  NEUROLOGIC: No focal weakness or numbness. Has tingling and burning sensation in around the rash that started today.  PSYCHIATRIC: Bipolar depression.   PHYSICAL EXAMINATION:  VITAL SIGNS: Temperature 98.5, pulse rate 61, respiratory rate 20, blood pressure 134/89. While standing is 113/76, oxygen saturation 100% a couple of days ago, but recently, no oxygen saturation is documented.  GENERAL: The patient is a well-developed female, ambulating in the hall, no obvious distress.  HEENT: Normocephalic, atraumatic. Pupils are equal. Moist mucous membranes.  NECK: Supple. No thyroid tenderness. No cervical lymphadenopathy.  CARDIOVASCULAR: S1, S2 regular. No significant murmurs, rubs, or gallops.  LUNGS: Clear to auscultation without wheezing, rhonchi, or rales.  ABDOMEN: Soft, nontender, nondistended.  EXTREMITIES: No pitting  edema  SKIN: There is a patchy maculopapular well-demarcated rash.  To me it appears to be a dermatomal T4 zone on the right side, that goes laterally towards the flank and also there are some on her back, which is more of a bigger patch, again with the  same qualities of tenderness to touch.  See fine raised vesicles. NEUROLOGIC:  Cranial nerves II-XII grossly intact.  Strength is 5/5 in all extremities.  Sensation is intact to light touch.  PSYCHIATRIC: Awake, alert, and oriented x 3, little anxious.   LABORATORY AND IMAGING: Today, ammonia level is 18. No recent labs otherwise.   ASSESSMENT AND PLAN: We have a 54 year old female with history of rheumatoid arthritis on Humira, gastroesophageal reflux disease, hypothyroidism, and bipolar depression who is admitted under involuntary commitment to psychiatry and appears to have shingles.  At this point, I have discussed the case with the psychiatrist Dr. Bary Leriche, as well as infectious control and charge nurse. Given that this appears to be more likely a shingles primary and started today, I would admit the patient to my service from psychiatric and transfer to airborne isolation room with full contact precaution. I would start her on Valacyclovir,  as well as ibuprofen for pain. I would also obtain ID consult.   Would also check Varicella zoster IgM antibodies and get some cultures from the lesion. In regards to bipolar depression, I would obtain a psychiatric consult for resumption of care. I would not change her psychiatric medications as they are now and started the patient on sitter.  Thank you for allowing [place] to see your patient.  At this point given the isolation needs, she is not able to stay in behavioral medicine; therefore, I did transfer her care to our service.   TOTAL TIME SPENT: About 50 minutes.     ____________________________ Vivien Presto, MD sa:ts D: 01/27/2014 17:50:24 ET T: 01/27/2014 18:29:28 ET JOB#: [id number]  cc: Vivien Presto, MD, <Dictator> Lavera Guise, MD Karel Jarvis [facility] MD ELECTRONICALLY SIGNED 02/06/2014 17:13

## 2014-11-21 NOTE — Consult Note (Signed)
Referring Physician:  Lytle Butte   Primary Care Physician:   Lavera Guise : [facility], [address], [place], [place] [address], [phone]  Reason for Consult: Admit Date: 16-Aug-2013  Chief Complaint: leg weakness   History of Present Illness: History of Present Illness:   54 year old woman with a history of bipolar disorder and gastric bypass in 2003 presents with gradually worsening leg weakness over the last month or so.  This has gotten much worse over the past week.  However, seems to be waxing and waning as it has goten much better spontaneously over the past two days.  Upon admission she reported being unable to move her legs.  However, today in the room when I walk in she is walking though the nursing assistant is helping "guide" her.  She was seen recently at [facility] ED for this complaint and was discharged from the ED.  I saw her in clinic last week and her weakness appeared highly effort dependent.  She is noted to have had elevated ammonia levels off and on recently.  She endorses associated symptoms of dysarthria, confusion and difficulty with word finding.  There is no associated leg pain or numbness.  No back pain.   MEDICAL AND SURGICAL HISTORY:  arthritis, disorder, bypass surgery 2003.  HISTORY:  any alcohol, tobacco or drug usage.  HISTORY:  for Parkinson's in her father as well as diabetes.  MEDICATIONS:  intramuscular injection once monthly, D 12 hours by mouth twice daily as needed, D3 4000 international units daily, 1 tablet by mouth daily, 70 mg by mouth q. weekly, 20 mg by mouth daily as needed for edema, 40 mg subcutaneous injection every two weeks, 30 mL by mouth 3 times daily, Lamictal 200 mg by mouth daily, 120 mg by mouth daily, 75 mcg by mouth daily,1 tablet by mouth daily, 40 mg by mouth daily, 200 mg by mouth 3 times daily, 550 mg by mouth twice daily, 1 gram by mouth 2 times daily as needed,300 mg 2 capsules by mouth twice daily, 5 mg by  mouth 3 times daily as needed for anxiety, 100 mg by mouth 3 times daily.   known drug allergies.    ROS:  General denies complaints   HEENT no complaints   Lungs no complaints   Cardiac no complaints   GI no complaints   GU no complaints   Musculoskeletal no complaints   Extremities no complaints   Skin no complaints   Endocrine no complaints   Psych no complaints   Past Medical/Surgical Hx:  Anemia:   Bipolar Disorder:   depression:   Rheumatoid Arthritis:   Stent in bile duct:   heel spur removal x2:   Cholecystectomy:   gastric bypass:   Home Medications: Medication Instructions Last Modified Date/Time  Humira 40 mg/0.8 mL subcutaneous kit  subcutaneous every 2 weeks 17-Jan-15 03:56  b12  intramuscular once a month 17-Jan-15 03:56  rifaximin 550 mg oral tablet 1 tab(s) orally 2 times a day 17-Jan-15 03:56  D3-5 4000 international unit(s) orally once a day 17-Jan-15 03:56  sucralfate 1 g oral tablet 1 tab(s) orally 2 times a day, As Needed 17-Jan-15 03:56  Lamictal 200 mg oral tablet tab(s) orally once a day 17-Jan-15 03:56  Nexium 40 mg oral delayed release capsule 1 cap(s) orally once a day 17-Jan-15 03:56  multivitamin 1  orally once a day 17-Jan-15 03:56  Clarinex-D 12 Hour  orally 2 times a day,  As Needed 17-Jan-15 03:56  furosemide 20 mg oral tablet 1 tab(s) orally once a day, As Needed for edema 17-Jan-15 03:56  ursodiol 300 mg oral capsule 2 cap(s) orally 2 times a day 17-Jan-15 03:56  Wellbutrin 100 mg oral tablet tab(s) orally 3 times a day 17-Jan-15 03:56  Latuda 120 mg oral tablet 1 tab(s) orally once a day 17-Jan-15 03:56  Provigil 200 mg oral tablet 1 tab(s) orally 3 times a day 17-Jan-15 03:56  Fosamax 70 mg oral tablet 1 tab(s) orally once a week 17-Jan-15 03:56  levothyroxine 75 mcg (0.075 mg) oral tablet 1 tab(s) orally once a day 17-Jan-15 03:56  estroven 1 tab(s) orally once a day 17-Jan-15 03:56  Valium 5 mg oral tablet 1 tab(s) orally  3 times a day, As Needed - for Anxiety, Nervousness  17-Jan-15 03:56  lactulose 30 milliliter(s) orally 3 times a day 17-Jan-15 03:56   [facility] Current Meds:   Sodium Chloride 0.9%, 1000 ml at 100 ml/hr  Acetaminophen * tablet, ( Tylenol (325 mg) tablet)  650 mg Oral q4h PRN for pain or temp. greater than 100.4  - Indication: Pain/Fever  HePARin injection, 5000 unit(s), Subcutaneous, q8h  Indication: Anticoagulant, Monitor Anticoags per hospital protocol  Ondansetron injection, ( Zofran injection )  4 mg, IV push, q4h PRN for Nausea/Vomiting  Indication: Nausea/ Vomiting  Diazepam tablet, ( Valium)  5 mg Oral q8h PRN for anxiety  - Indication: Anxiety/ Convulsive Disorders/ Muscle Relaxation/ Alcohol Withdrawal  buPROPion HCl tablet, ( Wellbutrin (immediate release))  100 mg Oral tid  - Indication: Depression/ Smoking Cessation.Marland KitchenMarland KitchenMarland KitchenPatient's Own Med  Levothyroxine tablet, ( Synthroid)  0.075 mg Oral q6am  - Indication: Thyroid Hormone Replacement.Marland KitchenMarland KitchenMarland KitchenPatient's Own Med  Rifaximin tablet,  ( Xifaxan)  550 mg Oral bid  - Indication: traveler's diarrhea.Marland KitchenMarland KitchenMarland KitchenPatient's Own Med  Ursodiol capsule, ( Actigall)  600 mg Oral bid  - Indication: Dissolution Gallbladder Stones.Marland KitchenMarland KitchenMarland KitchenPatient's Own Med  Non-Formulary Medication, lamotrigine 218m  200 mg Oral daily..Marland KitchenMarland Kitchenatient's Own Med  Non-Formulary Medication, esomeprazole (nexium)  40 mg Oral qam...Marland Kitchenatient's Own Med  Non-Formulary Medication, modafinil (provigil) 2038m 2 tablet(s) Oral qam  Non-Formulary Medication, modafanil  1 tablet(s) Oral <User Schedule> ( every 1 day: 12:00 )...Marland KitchenMarland Kitchentient's Own Med  Non-Formulary Medication, lurasidone (latuda)  120 mg Oral qsupper  Instructions:  **MUST be given with a full meal**...Marland KitchenMarland Kitchentient's Own Med  Lactulose 10G/1544myrup, ( Cephulac 10G/47m108mrup )  20 ml Oral tid  -Indication:Constipation/ Encephalopathy...PaMarland KitchenMarland Kitchenent's Own Med  Allergies:  No Known Allergies:   Vital Signs: **Vital Signs.:    19-Jan-15 14:10  Vital Signs Type Routine  Temperature Temperature (F) 98.2  Celsius 36.7  Temperature Source oral  Pulse Pulse 79  Respirations Respirations 18  Systolic BP Systolic BP 111 443astolic BP (mmHg) Diastolic BP (mmHg) 74  Mean BP 86  Pulse Ox % Pulse Ox % 95  Pulse Ox Activity Level  At rest  Oxygen Delivery Room Air/ 21 %   EXAM: GENERAL: Pleasant.  NAD.  Normocephalic and atraumatic.  EYES: Funduscopic exam shows normal disc size, appearance and C/D ratio without clear evidence of papilledema.  CARDIOVASCULAR: S1 and S2 sounds are within normal limits, without murmurs, gallops, or rubs.  MUSCULOSKELETAL: Bulk - Normal Tone - Normal Pronator Drift - Absent bilaterally. Ambulation - Gait and station are moderately unsteady. Romberg - Positive  R/L 5/5    Shoulder abduction (deltoid/supraspinatus, axillary/suprascapular n, C5) 5/5    Elbow flexion (biceps brachii, musculoskeletal n, C5-6) 5/5  Elbow extension (triceps, radial n, C7) 5/5    Finger adduction (interossei, ulnar n, T1)   At least 4+/5 in the lower extremities, giveaway weakness is noted.  NEUROLOGICAL: MENTAL STATUS: Patient is oriented to person, place and time.  Recent and remote memory are intact.  Attention span and concentration are intact.  Naming, repetition, comprehension and expressive speech are within normal limits.  Patient's fund of knowledge is within normal limits for educational level.  CRANIAL NERVES: Normal    CN II (normal visual acuity and visual fields) Normal    CN III, IV, VI (extraocular muscles are intact) Normal    CN V (facial sensation is intact bilaterally) Normal    CN VII (facial strength is intact bilaterally) Normal    CN VIII (hearing is intact bilaterally) Normal    CN IX/X (palate elevates midline, normal phonation) Normal    CN XI (shoulder shrug strength is normal and symmetric) Normal    CN XII (tongue protrudes midline)   SENSATION: Intact to pain  and temp bilaterally (spinothalamic tracts) Intact to position and vibration bilaterally (dorsal columns)   REFLEXES: R/L 2+/2+    Biceps 2+/2+    Brachioradialis   2+/2+    Patellar 2+/2+    Achilles   COORDINATION/CEREBELLAR: Finger to nose testing is within normal limits..  Lab Results:  Rhogam:  17-Jan-15 23:42   Notification -  Notification Date -  Notification Time -  Thyroid:  16-Jan-15 23:42   Thyroxine, Free  0.32 (Result(s) reported on 16 Aug 2013 at 03:43AM.)  Thyroid Stimulating Hormone  0.41 (0.45-4.50 (International Unit)  ----------------------- Pregnant patients have  different reference  ranges for TSH:  - - - - - - - - - -  Pregnant, first trimetser:  0.36 - 2.50 uIU/mL)  Hepatic:  16-Jan-15 23:42   Bilirubin, Total 0.2  Alkaline Phosphatase  183 (45-117 NOTE: New Reference Range 06/20/13)  SGPT (ALT) 17  SGOT (AST) 32  Total Protein, Serum 6.5  Albumin, Serum  3.1  Routine BB:  17-Jan-15 23:42   Antibody 1 Anti- -  Antibody 2 Anti- -  Antibody 3 Anti- -  Antibody 4 Anti- -  Antibody 5 Anti- -  Cardiology:  16-Jan-15 23:36   Ventricular Rate 66  Atrial Rate 66  P-R Interval 142  QRS Duration 92  QT 468  QTc 490  P Axis 34  T Axis 35  ECG interpretation Normal sinus rhythm Possible Left atrial enlargement Prolonged QT Abnormal ECG When compared with ECG of 10-Jul-2013 20:49, No significant change was found ----------unconfirmed---------- Confirmed by OVERREAD, NOT ([id number]), editor PEARSON, BARBARA ([id number]) on 08/16/2013 9:07:32 AM  Routine Chem:  16-Jan-15 23:42   Glucose, Serum  124  BUN 13  Creatinine (comp) 0.62  Sodium, Serum 138  Potassium, Serum  3.4  Chloride, Serum 102  CO2, Serum 28  Calcium (Total), Serum  8.2  Osmolality (calc) 277  eGFR (African American) >60  eGFR (Non-African American) >60 (eGFR values <82m/min/1.73 m2 may be an indication of chronic kidney disease (CKD). Calculated eGFR is useful in patients  with stable renal function. The eGFR calculation will not be reliable in acutely ill patients when serum creatinine is changing rapidly. It is not useful in  patients on dialysis. The eGFR calculation may not be applicable to patients at the low and high extremes of body sizes, pregnant women, and vegetarians.)  Anion Gap 8  Ammonia, Plasma  33 (Result(s) reported on 16 Aug 2013 at 12:12AM.)  17-Jan-15 23:42   Result Comment ANTIBODY ID - TEST NOT NEEDED FOR PATIENT  Result(s) reported on 16 Aug 2013 at 12:16AM.  Urine Drugs:  42-AST-41 96:22   Tricyclic Antidepressant, Ur Qual (comp) NEGATIVE (Result(s) reported on 16 Aug 2013 at 12:36AM.)  Amphetamines, Urine Qual. NEGATIVE  MDMA, Urine Qual. POSITIVE  Cocaine Metabolite, Urine Qual. NEGATIVE  Opiate, Urine qual NEGATIVE  Phencyclidine, Urine Qual. NEGATIVE  Cannabinoid, Urine Qual. NEGATIVE  Barbiturates, Urine Qual. NEGATIVE  Benzodiazepine, Urine Qual. POSITIVE (----------------- The URINE DRUG SCREEN provides only a preliminary, unconfirmed analytical test result and should not be used for non-medical  purposes.  Clinical consideration and professional judgment should be  applied to any positive drug screen result due to possible interfering substances.  A more specific alternate chemical method must be used in order to obtain a confirmed analytical result.  Gas chromatography/mass spectrometry (GC/MS) is the preferred confirmatory method.)  Methadone, Urine Qual. NEGATIVE  Cardiac:  16-Jan-15 23:42   CK, Total 85  CPK-MB, Serum 1.2 (Result(s) reported on 16 Aug 2013 at 12:50AM.)  Troponin I < 0.02 (0.00-0.05 0.05 ng/mL or less: NEGATIVE  Repeat testing in 3-6 hrs  if clinically indicated. >0.05 ng/mL: POTENTIAL  MYOCARDIAL INJURY. Repeat  testing in 3-6 hrs if  clinically indicated. NOTE: An increase or decrease  of 30% or more on serial  testing suggests a  clinically important change)  Routine UA:  16-Jan-15  23:42   Color (UA) Yellow  Clarity (UA) Clear  Glucose (UA) Negative  Bilirubin (UA) Negative  Ketones (UA) Trace  Specific Gravity (UA) 1.019  Blood (UA) Negative  pH (UA) 5.0  Protein (UA) Negative  Nitrite (UA) Negative  Leukocyte Esterase (UA) Negative (Result(s) reported on 16 Aug 2013 at 12:18AM.)  RBC (UA) 3 /HPF  WBC (UA) 6 /HPF  Bacteria (UA) TRACE  Epithelial Cells (UA) 1 /HPF  Mucous (UA) PRESENT  Hyaline Cast (UA) 71 /LPF (Result(s) reported on 16 Aug 2013 at 12:18AM.)  Routine Coag:  17-Jan-15 00:55   Prothrombin 12.9  INR 1.0 (INR reference interval applies to patients on anticoagulant therapy. A single INR therapeutic range for coumarins is not optimal for all indications; however, the suggested range for most indications is 2.0 - 3.0. Exceptions to the INR Reference Range may include: Prosthetic heart valves, acute myocardial infarction, prevention of myocardial infarction, and combinations of aspirin and anticoagulant. The need for a higher or lower target INR must be assessed individually. Reference: The Pharmacology and Management of the Vitamin K  antagonists: the seventh ACCP Conference on Antithrombotic and Thrombolytic Therapy. WLNLG.9211 Sept:126 (3suppl): [id number]. A HCT value >55% may artifactually increase the PT.  In one study,  the increase was an average of 25%. Reference:  "Effect on Routine and Special Coagulation Testing Values of Citrate Anticoagulant Adjustment in Patients with High HCT Values." American Journal of Clinical Pathology 2006;126:400-405.)  Routine Hem:  16-Jan-15 23:42   WBC (CBC)  3.0  RBC (CBC)  2.79  Hemoglobin (CBC)  9.3  Hematocrit (CBC)  28.0  Platelet Count (CBC) 297  MCV 100  MCH 33.4  MCHC 33.3  RDW  15.8  Neutrophil % 58.1  Lymphocyte % 30.6  Monocyte % 5.9  Eosinophil % 4.4  Basophil % 1.0  Neutrophil # 1.7  Lymphocyte #  0.9  Monocyte # 0.2  Eosinophil # 0.1  Basophil # 0.0 (Result(s) reported on 16 Aug 2013 at 12:06AM.)  Erythrocyte Sed Rate  37 (Result(s) reported on 16 Aug 2013 at 12:50AM.)   Radiology Results: MRI:    18-Jan-15 12:23, MRI Brain Without Contrast  MRI Brain Without Contrast   REASON FOR EXAM:    cerebellar ataxia- multiple sclerosis protocol.  COMMENTS:       PROCEDURE: MR  - MR BRAIN WO CONTRAST  - Aug 17 2013 12:23PM     CLINICAL DATA:  Cerebellar ataxia. Bilateral leg weakness for 1  week. Type lobe and slurred speech.Confusion. Multiple sclerosis  protocol.    EXAM:  MRI HEAD WITHOUT CONTRAST    TECHNIQUE:  Multiplanar, multiecho pulse sequences of the brain and surrounding  structures were obtained without intravenous contrast.  COMPARISON:  Head CT 05/07/2013 and brain MRI 02/08/2005    FINDINGS:  There is a new 6 mm focus of T2/FLAIR hyperintensity within the  subcortical right parietal lobe. There is no evidence of acute  infarct, mass, midline shift, intracranial hemorrhage, or  extra-axial fluid collection. Major intracranial vascular flow voids  are unremarkable. Paranasal sinuses and mastoid air cells are clear.  Orbits are normal.     IMPRESSION:  1. No evidence of acute intracranial abnormality.  2. New 6 mm focus of T2 signal abnormality in thesubcortical right  parietal lobe. This is nonspecific, with considerations including  sequelae of small vessel ischemic insult, sequelae of interval  trauma, vasculitis, or demyelination. However, no other lesions are  identified suggestive of demyelination.      Electronically Signed    By: Logan Bores    On: 08/17/2013 12:35         Verified By: Ferol Luz, M.D.,   Impression/Recommendations: Recommendations:   54 year old woman with a history of bipolar disorder and gastric bypass in 2003 presents with gradually worsening leg weakness over the last month or so.  with low WBC and Hgb.  Uncertain etiology for her leg weakness.  Waxing and waning nature with giveaway weakness raises  concern for functional etiology.  6 mm lesion seen in right parietal lobe is of uncertain clinical significance.  Ammonia level appears to be slightly elevated though close to normal.  However, ammonia level itself is a poor and often delayed marker for hepatic encephalopathy which can cause gait disturbance and symptoms of ataxia.  Consider EEG to rule out hepatic encephalopathy (triphasic waves).  Consider MRI of full spine to rule out any lesion that could be contributing.  Physical therapy for gait and balance training.  Check thyroglobulin antibody and thyroperoxidase antibody to rule out Hashimoto's.  TSH and T4 both low, concern for secondary hypothyroidism, consult endocrine.  MG panel could be considered though this is somewhat unlikely.  Likely a functional component to her symptoms given the giveaway weakness and profound waxing and waning nature of her weakness, but need to rule out organic pathology before considering psych consultation. have reviewed the results of the most recent imaging studies, tests and labs as outlined above and answered all related questions.  have personally viewed the patient's Brain MRI and it shows a new, small right parietal lesion of nonspecific etiology. and coordinated plan of care with hospitalist.   Electronic Signatures: Anabel Bene (MD)  (Signed 19-Jan-15 23:49)  Authored: REFERRING PHYSICIAN, Primary Care Physician, Consult, History of Present Illness, Review of Systems, PAST MEDICAL/SURGICAL HISTORY, HOME MEDICATIONS, Current Medications, ALLERGIES, NURSING VITAL SIGNS, Physical Exam-, LAB RESULTS, RADIOLOGY RESULTS, Recommendations   Last Updated: 19-Jan-15 23:49 by Anabel Bene (MD)

## 2014-11-21 NOTE — Consult Note (Signed)
PATIENT NAME:  Jamie Bennett, Jamie Bennett MR#:  [id number] DATE OF BIRTH:  21-Jan-1961  DATE OF CONSULTATION:  08/16/2013  CONSULTING PHYSICIAN:  Audree Camel. Charise Carwin, MD  REQUESTING PHYSICIAN:  Dr. Lavetta Nielsen  DIAGNOSIS:  Ataxia/spells.   NEUROLOGIST:  Dr. Melrose Nakayama  HISTORY OF PRESENT ILLNESS:  Jamie Bennett is a 54 year old white female with a known prior history of bipolar disorder, hypothyroidism, prior gastric bypass surgery with B12 deficiency, who is undergoing evaluation for recurrent spells of confusion and leg weakness. History is according to the patient and her husband, who are good historians, and  review of the hospital chart.   Jamie Bennett indicates she was in her usual state of health until 05/12/2013. 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 She indicates she had to stay home, and since then has had recurrent spells that will occur in periods up to 2 weeks interspersed by periods in which she is relatively symptom-free for up to 2 weeks. A typical spell is characterized by her legs being very tired, she has trouble getting out of a chair, and has cognitive/memory difficulties. She indicates that these symptoms will remain the same through the day, and only occur when she is ambulating, although her husband indicates they seem to occur in any position. There is no specific trigger for the spells.   This pattern apparently persisted until around Christmas time, at which point her husband indicates that he apparently came home and found his wife on the floor confused and incontinent. She appeared to be talking very slowly, and had no recall of her event. He had taken her to [facility] Emergency Room at that time, and she was treated and released. This week, she apparently has had more persistent spells that she indicates consisted of some diplopia lasting anywhere from an  hour up to 5 hours, with associated leg pain and disorientation. She apparently has been in and out of the emergency rooms locally, until at which point she was apparently advised by her neighbor (Dr. Jacqualine Code) to look come back to the Emergency Room and make sure the ER personnel knows that she cannot care for herself. She was admitted yesterday, and indicates that today she has continued to have significant difficulties with trouble getting out of bed, requiring a gait belt and even 2 nurses to get her back into the bed. She has constant leg pain. She also notes problems with urinary and stool retention, but no incontinence. She also indicates tremors in both hands over the past week, and her husband indicates that her writing has progressively deteriorated. There has been no trouble with numbness or any back pain or headaches with the episodes. Her husband does indicate she has had history of gastric bypass and B12 deficiency, and developed anemia in the past.   The patient indicates that she did see Dr. Melrose Nakayama of Neurology about 4 days ago. Apparently an EEG was ordered, and the exact etiology of her presentation was unclear at that time.   PAST MEDICAL HISTORY: Notable for history of gastric bypass with B12 deficiency, as noted above. She has had a history of bipolar disorder (husband indicates currently under good control), hypothyroidism, rheumatoid arthritis, depression.  SURGICAL HISTORY: Includes gastric bypass surgery in 2003.   MEDICATIONS AT THIS TIME: Lurasidone, modafinil, esomeprazole, Lamictal, Actigall, Xifaxan, Synthroid, Wellbutrin, Valium, Tylenol, Zofran.  ALLERGIES: No known drug allergies.  SOCIAL HISTORY:  She resides with her husband locally. Does not use tobacco or alcohol.   FAMILY HISTORY:  Reviewed and noncontributory.   REVIEW OF SYSTEMS:  Neurological review of systems noted above.   PHYSICAL EXAMINATION: GENERAL: Reveals a pleasant, cooperative, middle-aged white  female, who is in no acute distress.  VITAL SIGNS: She is currently afebrile. Her vital signs are stable. Her blood pressure is noted to be actually 99/65.  NEUROLOGIC: She is alert and oriented x 3, with fluent speech.  Able to name, repeat, and follow commands. Other areas of high intellectual functioning appear to be intact. Her extraocular movements were intact. Pupils equal, round, and reactive to light. Face is symmetric. Palate elevates symmetrically. Tongue protrudes midline. Otherwise, cranial nerves II to XII appear to be intact. Motor examination:  She has normal bulk and tone, with 5/5 strength throughout. There may be some mild proximal leg weakness with manual testing. There is no appreciable tremor noted. Strength appears to be full, 5/5 in the distal lower extremities. Sensory exam reveals symmetric pinprick, vibration, as well as proprioception in the legs. Cerebellar exam:  On finger-to-nose, there is no evidence of pronator drift. Deep tendon reflexes are 1+ to 2 throughout, with toes that appear to be downgoing bilaterally.  CARDIOVASCULAR EXAM: Regular rate and rhythm, without murmur, rub or gallop. No evidence of carotid bruits. No evidence of cyanosis, clubbing, or edema.  ABDOMEN: Soft, nontender, with normoactive bowel sounds.  LUNGS:  Clear to auscultation.  SKIN EXAM:  Reveals no obvious cuts, abrasions, bruises, or rashes.   DIAGNOSTIC STUDIES: Include simply lab work at this time, with a chest x-ray revealing mild patchy left basilar opacity, suggestive of atelectasis versus pneumonia. She also had lab work performed, including and INR of 1. Sed rate was 37. TSH was 0.41. Urine drug screen was positive for benzodiazepines and MDMA. Ammonia level of 33. She has had a metabolic panel revealing glucose 124, alkaline phosphatase elevated at 183, normal transaminases. CBC is unremarkable, except low white count of 3. Platelet count is 297.   ASSESSMENT: Chronic recurrent "spells"  (leg pain/weakness, confusion).   DISCUSSION: At this time, Jamie Bennett presents with a history of bipolar disorder, prior gastric bypass, and history of B12 deficiency. She now presents with a multifaceted, chronic,  recurrent symptom complex since October 2014, consisting of intermittent bouts of leg pain/weakness, confusion, as well as double vision. This apparently has worsened over the past week, culminating in inability to stand and ambulate unassisted. Neurologic examination is mostly nonfocal, with the exception of possibly some mild proximal lower extremity weakness. Overall, the etiology of her current spells is unclear, and the differential is broad at this point. We will need to consider both central (i.e. vertebrobasilar insufficiency, partial seizures, TIAs, cord abnormality, psychiatric difficulties, etc.) and peripheral (i.e., neuromuscular junction disorder, toxic/nutritional disorder, etc.) etiologies. She does have a history of anemia, and the possibility of entities such as pernicious anemia could be considered in the differential. Overall, it is very difficult to localize all of her symptoms (confusion, leg weakness, leg pain, diplopia, etc.) into  one diagnostic neurological entity.    RECOMMENDATIONS: I agree with a head MRI, but will also add a head MRA to evaluate her vertebrobasilar circulation. We may need to consider an MRI of her cervical, thoracic, and lumbar spine, if her head MRI is negative and she continues to have bouts of leg weakness. EEG should be checked, along with a B12 level, and an acetylcholine  receptor antibody level. If her workup is negative, then one may wish to consider a peripheral blood smear to evaluate for any megaloblasts, given the evidence of anemia on her current labs. Of note is that her MCV is at the upper range of normal. I would also potentially consider lower extremity Dopplers, given leg pain, as well as checking a serum CK level. I would continue  PT, OT, and speech therapy.   I had a full discussion with the patient and her husband at the bedside. Thank you very much for allowing me to participate in the care of this very interesting and pleasant patient.    ____________________________ Audree Camel. Charise Carwin, MD ray:mr D: 08/16/2013 21:56:27 ET T: 08/16/2013 22:52:00 ET JOB#: [id number]  cc: Herbie Baltimore A. Charise Carwin, MD, <Dictator> Cherre Robins MD ELECTRONICALLY SIGNED 09/20/2013 20:39

## 2014-11-21 NOTE — H&P (Signed)
PATIENT NAME:  Jamie Bennett, Jamie Bennett MR#:  [id number] DATE OF BIRTH:  06-25-1961  DATE OF ADMISSION:  08/16/2013  REFERRING PHYSICIAN:  Dr. Marjean Donna.   PRIMARY CARE PHYSICIAN:  Dr. Clayborn Bigness.   CHIEF COMPLAINT:  Leg weakness.   HISTORY OF PRESENT ILLNESS:  A 54 year old Caucasian female with a past medical history of bipolar disorder, gastric bypass in 2003, hypothyroidism, presenting with leg weakness, cough.  Apparently, she describes mild symptoms for approximately one month in duration, however over the last week she has been having progressive weakness to the point where she can no longer ambulate or even move her leg.  She is bedridden at this point.  Despite these symptoms, she denies any leg pain, fevers, chills, chest pain, palpitations.  She does mention positive trace edema.  She also notes associated double vision which occurs sporadically throughout the day with occasional slurred speech as well as confusion and occasional difficulty word-finding.  These symptoms confirmed by her husband at bedside.  She actually went to [facility] with similar symptoms of milder intensity and was discharged from the Emergency Department without further work-up and intervention.  This was on January 12th, asked to follow up with her PCP.  Currently, she is complaining only of leg weakness.   REVIEW OF SYSTEMS:  CONSTITUTIONAL:  Positive for fatigue and weakness.  Denies fevers or weight changes.  EYES:  Positive for double vision.  Denies any eye pain or redness or inflammation.  EARS, NOSE, THROAT:  Denies tinnitus, ear pain, hearing loss.  RESPIRATORY:  Denies cough, wheeze, shortness of breath.  CARDIOVASCULAR:  Denies chest pain, palpitations.  Positive for trace edema.  GASTROINTESTINAL:  Denies nausea, vomiting, diarrhea, abdominal pain.  GENITOURINARY:  Denies dysuria, hematuria.  ENDOCRINE:  Denies nocturia or thyroid problems.  HEMATOLOGY AND LYMPHATIC:  Denies easy bruising or  bleeding.  SKIN:  Denies rashes or lesions.  MUSCULOSKELETAL:  Denies pain in neck, back, shoulder, knees, hips, arthritic symptoms.  NEUROLOGIC:  Positive for weakness as described above.  Denies any numbness or headaches.  PSYCHIATRIC:  Denies current anxiety or depressive symptoms.  Otherwise, full review of systems performed by me is negative.   PAST MEDICAL HISTORY:  Rheumatoid arthritis, depression, bipolar disorder, hypothyroidism, gastric bypass surgery 2003.   SOCIAL HISTORY:  Denies any alcohol, tobacco or drug usage.   FAMILY HISTORY:  Positive for Parkinson's in her father as well as diabetes.   ALLERGIES:  No known drug allergies.   HOME MEDICATIONS:  Include B12 intramuscular injection once monthly, Clarinex D 12 hours by mouth twice daily as needed, vitamin D3 4000 international units daily, Estroven 1 tablet by mouth daily, Fosamax 70 mg by mouth q. weekly, Lasix 20 mg by mouth daily as needed for edema, Humira 40 mg subcutaneous injection every two weeks, lactulose 30 mL by mouth 3 times daily, Lamictal 200 mg by mouth daily, Latuda 120 mg by mouth daily, Synthroid 75 mcg by mouth daily, multivitamin 1 tablet by mouth daily, Nexium 40 mg by mouth daily, Provigil 200 mg by mouth 3 times daily, rifaximin 550 mg by mouth twice daily, sucralfate 1 gram by mouth 2 times daily as needed, Ursodiol 300 mg 2 capsules by mouth twice daily, Valium 5 mg by mouth 3 times daily as needed for anxiety, Wellbutrin 100 mg by mouth 3 times daily.   PHYSICAL EXAMINATION: VITAL SIGNS:  Temperature 97.8, heart rate 69, respirations 20, blood pressure 97/55, saturating 98% on room air.  Weight 90.7  kg, BMI of 32.3.   GENERAL:  Well-nourished, well-developed Caucasian female, currently in no acute distress.  HEAD:  Normocephalic, atraumatic.  EYES:  Pupils equal, round, reactive to light and accommodation.  Extraocular muscles intact.  No scleral icterus.  She has a left-sided gaze nystagmus.  MOUTH:   Moist mucosal membranes.  Dentition intact.  No abscess noted.  EARS, NOSE, THROAT:  Throat clear without exudates.  No external lesions.  NECK:  Supple.  No thyromegaly.  No nodules.  No JVD.  PULMONARY:  Clear to auscultation bilaterally.  No wheezes, rubs or rhonchi.  No use of accessory muscles.  Good respiratory effort.   CHEST:  Nontender to palpation.  CARDIOVASCULAR:  S1, S2, regular rate and rhythm.  No murmurs, rubs or gallops.  Trace edema bilaterally to ankles.  Pedal pulses 2+ bilaterally.  GASTROINTESTINAL:  Soft, nontender, nondistended.  No masses.  Positive bowel sounds.  No hepatosplenomegaly.  MUSCULOSKELETAL:  No swelling, clubbing.  Positive trace edema as described above.  Range of motion extremely limited in the lower extremities and proximal lower extremities including flexion, extension, abduction, adduction.  She, however, does have complete movement of her distal lower extremity from the ankle down.  NEUROLOGIC:  Cranial nerves II through XII grossly intact.  She does, however, have left gaze nystagmus.  She also has dysmetria bilaterally, slowed and abnormal dysdiadochokinesis.  Pronator drift is within normal limits.  Gait is deferred at this time.  Strength 5 out of 5 upper extremities, 0 out of 5 lower extremities bilateral proximal muscle groups, however distal muscle group.  She has complete strength, flexion, extension, abduction, adduction from the ankle down and she does not have clonus.  Babinski within normal limits.  Her reflexes are normal as well.  SKIN:  No ulcerations, lesions, rashes, cyanosis.  Skin warm, dry, turgor intact.  PSYCHIATRIC:  Mood and affect within normal limits, however does have circumferential speech pattern.  She is awake, alert, oriented x 3.  Occasional slurring speech during my exam.  Insight and judgment intact.   LABORATORY DATA:  Chest x-ray revealing minimal bibasilar atelectasis most prominent on the left.  Also, sodium 138, potassium  3.4, chloride 102, bicarb 28, BUN 13, creatinine 0.62, glucose 124, calcium 8.2, ammonia 33, total protein 6.5, albumin 3.1, bilirubin 0.2, alk phos 183.  Remainder of LFTs within normal limits.  TSH 0.41.  Urine drug screen positive for benzodiazepine as well as MDMA.  WBC 3, ESR 37, hemoglobin 9.3, platelets 297.  Urinalysis negative for signs of infection.   ASSESSMENT AND PLAN:  A 54 year old Caucasian female presenting with bilateral worsening lower extremity weakness as well as double vision. 1.  Cerebellar ataxia with left gaze nystagmus and dysmetria and dysdiadochokinesis, which is abnormal.  Unclear etiology at this time.  Given she has had a gastric bypass in the past, it does seem somewhat outside the window of causing problems since it was back in 2003; however, we will check thiamine, zinc and copper levels.  Give her thiamin now as a replacement as it could be a presentation of Wernicke's encephalopathy.  We will check MRI of brain, consult neurology for their expertise in this failed.  2.  Leg weakness.  Once again of unclear etiology.  We will get physical therapy involved as she will likely need placement.  3.  Hypokalemia:  Replace to goal of 4 to 5.  4.  Hypothyroidism with a low TSH.  We will check a Free T4.  Continue  Synthroid at current dosage until then.  5.  Anxiety, depression as well as bipolar disorder.  Continue her multitude of psychiatric medications including Wellbutrin, diazepam, Lamictal, Latuda and Provigil.  6.  VTE prophylaxis with heparin subQ.  7.  CODE STATUS:  THE PATIENT IS A FULL CODE.   TIME SPENT:  45 minutes.    ____________________________ Aaron Mose. Niomi Valent, MD dkh:ea D: 08/16/2013 03:14:07 ET T: 08/16/2013 04:23:13 ET JOB#: [id number]  cc: Aaron Mose. Jaleiah Asay, MD, <Dictator> Cindy Fullman Woodfin Ganja MD ELECTRONICALLY SIGNED 08/16/2013 21:46

## 2014-11-21 NOTE — Discharge Summary (Signed)
PATIENT NAME:  ALEJANDRO, ADCOX MR#:  [id number] DATE OF BIRTH:  09-01-1960  DATE OF ADMISSION:  12/08/2013 DATE OF DISCHARGE: 12/10/2013    HOSPITAL COURSE: See dictated history and physical for details of admission. This 54 year old woman was admitted to the hospital after taking an overdose of her prescription antidepressants. She had been increasingly depressed and anxious recently and also had had a problem with increasing alcohol abuse of late. In the hospital, she was treated for any signs of alcohol withdrawal, but did not have any seizures or any sign of delirium and was able to detox very easily without additional medication. She was treated for her depression. Medication was continued as she was taking outpatient. Medically, she was continued on medicine for her rheumatoid arthritis and liver disease. The patient participated in groups appropriately. She was treated with daily individual and group psychotherapy as well as medication management. She did not display any dangerous behaviors in the hospital. She shows good insight. She has appropriate followup treatment in place with her outpatient psychiatrist, Dr. Toy Care. At this point, the patient appears to no longer be an acute danger to self or others and is agreeable to the discharge plan. She has been counseled about the importance of sobriety and staying on her medication and following up with her outpatient psychiatrist and agrees to all of the above.   DISCHARGE MEDICATIONS: Fluoxetine 20 mg p.o. daily, Wellbutrin immediate release 100 mg 3 times a day, Humira 40 mg subcutaneous every 4 weeks, vitamin B12 injectable 1000 mcg once a month, clonazepam 0.5 mg 4 times a day, alendronate 70 mg once a week, Ursodiol 300 mg 2 capsules twice a day, lactulose syrup 20 mL twice a day, sucralfate 1 gram twice a day, Nexium 40 mg once a day, levothyroxine 100 mcg per day, multivitamin once a day, vitamin D supplement 4000 units once a day and Claritin  10 mg 1/2 tablet twice a day as needed for allergy symptoms.   LABORATORY RESULTS: Laboratories done on admission here included a drug screen positive for MDMA, but I believe that represents her Wellbutrin, benzodiazepines positive. Alcohol level negative. Chemistry panel: Elevated chloride 113, low ALT at 11, albumin low at 3.3. Hematology panel: Low hematocrit 31.4, low hemoglobin 10.3. Normal white count and platelet count. Urinalysis unremarkable. Acetaminophen and salicylates negative.   MENTAL STATUS EXAM AT DISCHARGE: Middle-aged woman, looks older than her stated age. Cooperative with the interview. Good eye contact. Slow psychomotor activity, but appears to be normal for her. Speech normal rate, tone and volume. Affect slightly constricted, but appropriate. Mood stated as being okay. Thoughts are lucid without loosening of associations. No evidence of delusions. Denies suicidal or homicidal ideation. Denies auditory or visual hallucinations. Shows improved insight and judgment, normal intelligence. Short and long-term memory intact. Alert and oriented x 4.   DISPOSITION: Discharge home with her husband. She is to follow up with Dr. Toy Care in [place] and  she is to stop drinking alcohol.   DIAGNOSIS, PRINCIPAL AND PRIMARY:  AXIS I: Major depression, recurrent, severe.  SECONDARY DIAGNOSES:  AXIS I: Alcohol abuse, possible dependence.  AXIS II: Deferred.  AXIS III: Rheumatoid arthritis, chronic liver insufficiency, vitamin D insufficiency, hypothyroidism.  AXIS IV: Moderate from chronic illness.  AXIS V: Functioning at time of discharge 60.  ____________________________ Gonzella Lex, MD jtc:aw D: 12/10/2013 10:02:08 ET T: 12/10/2013 10:23:14 ET JOB#: [id number]  cc: Gonzella Lex, MD, <Dictator> Gonzella Lex MD ELECTRONICALLY SIGNED 12/12/2013 15:34

## 2014-11-21 NOTE — Discharge Summary (Signed)
PATIENT NAME:  Jamie Bennett, UEDA MR#:  [id number] DATE OF BIRTH:  Dec 20, 1960  DATE OF ADMISSION:  01/27/2014 DATE OF DISCHARGE:  01/29/2014  Discharge Diagnosis:  * Shingles: improving on valtrex and gabapentin.  * Mood- on Pristiq 100 mg daily for depression, Zyprexa 10 mg bid as needed for severe anxiety and Trazodone 50 mg for sleep. Rx provided by psychiatry * Suicidal ideation-resolved SECONDARY DIAGNOSIS: Rheumatoid arthritis on Humira, depression, bipolar, hypothyroidism, gastric bypass surgery, history of hepatic encephalopathy, and liver disease CONSUTLATIONS: ID - Dr Adrian Prows PSYCHIATRY - Dr Bary Leriche  PROCEDURE/RADIOLOGY: NONE  History and Short Hospital Course: Patient is a 54 year old female with above mentioned medical problems was transferred/admitted from psychiatry floor due to Shingles lesion. Patient was started on Valtrex and she was responding well to it. She was placed under appropriate isloation per infection control policy. The patient was also started on gabapentin for neuropathic pain control. This seemed to help. Psychiatry consultation was obtained with Dr. Bary Leriche as the patient was originally admitted to psychiatry for management of depression and schizoaffective disorder, but considering her shingles lesions now, would not be able to go back to psychiatry right away, as lesions will need to be crusted and that can take a few days to weeks at times, and it was unpredictable. After discussion with psychiatry and infectious disease, along with the per patient's wishes, she was discharged home, with setting for outpatient psychiatric follow-up at [facility]. On the date of discharge, her vital signs were as follows: Temperature 96.5, heart rate 70 per minute, respirations 17 per minute, blood pressure 122/82 mmHg. She was saturating 100% on room air.  PERTINENT PHYSICAL EXAMINATION ON THE DATE OF DISCHARGE: CARDIOVASCULAR: S1 and S2 normal. No murmurs,  rubs or gallops.  LUNGS: Clear to auscultation bilaterally. No wheezing, rales, rhonchi, crepitations. SKIN: She has a dermatomal eruption of a small vesicles on her right and her thoracic area. She also has on her right posterior neck a small lymph node that is slightly tender. ABDOMEN: soft, benign NEURO: nonfocal examination  All other physical examination remained at baseline.  DISCHARGE MEDICATIONS:    Medication Instructions  humira 40 mg/0.8 ml subcutaneous kit  0.8 milliliter(s) subcutaneous every 4 weeks for rheumatoid arthritis   vitamin b-12 1000 mcg/ml injectable solution  1 milliliter(s) injectable once a month for anemia   alendronate 70 mg oral tablet  1 tab(s) orally once a week    ursodiol 300 mg oral capsule  2 cap(s) orally 2 times a day liver disease   lactulose 10 g/15 ml oral syrup  20 milliliter(s) orally 2 times a day liver failure   sucralfate 1 g oral tablet  1 tab(s) orally 2 times a day, As Needed, indigestion, heartburn , As needed, indigestion, heartburn   nexium 40 mg oral delayed release capsule  1 cap(s) orally once a day stomach acid   levothyroxine 100 mcg (0.1 mg) oral tablet  1 tab(s) orally once a day thyroid replacement   multivitamin  1 tab(s) orally once a day vitamins   cholecalciferol oral tablet  4000 unit(s) orally once a day vitamin d   clonazepam 1 mg oral tablet  1 tab(s) orally 4 times a day, As Needed - for Anxiety, Nervousness   alprazolam 1 mg oral tablet  1 tab(s) orally once a day (at bedtime), As Needed - for Inability to Sleep   nuvigil 250 mg oral tablet  1 tab(s) orally once a day   pristiq 50  mg oral tablet, extended release  2 tab(s) orally once a day for depression.   trazodone 50 mg oral tablet  1 tab(s) orally once a day (at bedtime)   olanzapine 10 mg oral tablet  1 tab(s) orally twice daily as needed for agitation   valtrex 1 g oral tablet  1 tab(s) orally every 8 hours   gabapentin 300 mg oral capsule  1 cap(s) orally 2  times a day     DISCHARGE DIET: Regular.   DISCHARGE ACTIVITY: As tolerated.   DISCHARGE INSTRUCTIONS AND FOLLOW-UP: The patient was instructed to follow up with her primary care physician, Dr. Clayborn Bigness in 1 to 2 weeks. She will need follow-up with Dr. Adrian Prows in 2 to 4 weeks. She was instructed to follow up with Dr. Robina Ade, who is her primary psychiatrist, and appointment for intensive outpatient psychiatric program in [place] on February 19, 2014, at 8:45 a.m., as scheduled.  TOTAL TIME DISCHARGING THIS PATIENT: 55 minutes.   ____________________________ Lucina Mellow. Manuella Ghazi, MD vss:cg D: 01/30/2014 23:39:00 ET T: 01/31/2014 05:55:36 ET JOB#: [id number]  cc: Lavera Guise, MD Cheral Marker. Ola Spurr, MD [place] Bary Leriche, MD Dr. Robina Ade in [place]. Manuella Ghazi, MD, <Dictator>    Lucina Mellow [facility] MD ELECTRONICALLY SIGNED 02/04/2014 12:25

## 2014-11-21 NOTE — Discharge Summary (Signed)
PATIENT NAME:  Jamie Bennett, Jamie Bennett MR#:  [id number] DATE OF BIRTH:  01/11/1961  DATE OF ADMISSION:  08/19/2013 DICTATED DATE OF ADMISSION:  08/16/2013 DATE OF DISCHARGE:  08/22/2013  REASON FOR ADMISSION:  Leg weakness.   DISCHARGE DIAGNOSES:   1.  Leg weakness of an unknown cause.  2.  Other diagnoses include bipolar disorder, history of gastric bypass in 2003, hypothyroidism.  3.  The patient recognizes that she is in significant distress due to stress at home.  4.  Hypokalemia, now replaced.  5.  Anxiety, depression, as well as bipolar. 6.  Polypharmacy.  7.  It was described as the diagnosis of possibility of cerebellar ataxia with gaze nystagmus. The patient did not have any cerebellar issues for what we cannot follow this.  8.  The patient has beginnings of liver disease, for what she follows with gastroenterologist. She also had a previous gastric bypass. Liver disease is likely secondary to fatty liver infiltration.  9.   The patient has accumulation of ammonia, for what she is on rifaximin.    DISPOSITION: Home.   FOLLOWUP: Followup with [facility].  Followup with Dr. Melrose Nakayama if the patient would like to continue his services. Followup with primary care physician, Dr. Clayborn Bigness.   MEDICATIONS AT DISCHARGE:   1.  Lamictal 200 mg once a day.  2.  Nexium 40 mg once a day.  3.  Multivitamins once a day.  4.  Clarinex D every 12 hours 1 tablet.  5.  Furosemide 200 mg once a day as needed for edema.  6.  Ursodiol 300 mg 2 capsules twice a day. 7.  Wellbutrin mg 3 times a day.  8.  Provigil 200 mg 3 times a day.  9.  Fosamax 70 mg once a day.  10.  Levothyroxine 75 mcg once a day.  11.  Estroven once a day.  12. Valium 5 mg 2 times a day as needed for anxiety or nervousness. 13.  Lactulose 30 mg 3 times a day due to elevation of ammonia.  14.  Humira 40 mg subcutaneously every 2 weeks.  15.  Vitamin B12 intramuscular shots once a month.  16.  Rifaximin 550 mg twice daily.   17.  Sucralfate 1 gram twice daily.  18.  Acetaminophen with oxycodone 7.5/325 mg every 8 hours as needed for pain.  19.  Potassium chloride 20 mEq once a day. 20.  Latuda 120 mg once a day.   HOSPITAL COURSE: This is a very nice 54 year old female admitted in 08/16/2013, with a history of leg weakness. Apparently, the patient had symptoms for over a month with significant worsening within the last week, to the point that she was not able to ambulate anymore. The patient got to the point that she was incapasitated . Despite the symptoms, the patient did not have any fevers, leg pains. The patient had some trace edema, pain associated to this. She had double vision on and off and difficulty finding words and getting confused. She went to [facility] with similar symptoms of moderate intensity the previous week, and she was discharged from the Emergency Department. She was referred to Neurology as well, and they were going to do an outpatient EEG.   The patient has rheumatoid arthritis and she takes Humira every 2 weeks.    There was no significant site of infection related to thisproblem  medication that could cause immunodeficiency.   The patient was evaluated.  Overall, on physical exam, her blood  pressure was 97/55. The patient looked slightly dehydrated. As far as the description given to me by the admitting physician, she was admitted with diagnosis of cerebellar ataxia with left gaze nystagmus and dysmetria with dysdiadochokinesis.   All of the symptoms cleared up at discharge.   IMPORTANT RESULTS:  Glucose on admission was 124, sodium 138, potassium 3.4. Ammonia level of 33, which apparently has been elevated in the past more than that. Calcium 8.2, albumin 3.1, alkaline phosphatase 493, AST and ALT within normal limits. Cardiac enzymes negative. TSH was 0.41 and thyroxine was 0.32. Urine drug screen showed positive for benzodiazepines and positive for MDMA, which was thought to be a  cross-reaction with benzos.   White blood count of 3. Erythro sed rate of 37, which is slightly elevated. Hemoglobin 9.3, platelet count 297.   URINALYSIS: Six white blood cells, 3 red blood cells. Vitamin B12 level of 502. Acetylcholine receptor antibodies 0.19, which is normal. Aldolasenl  Angiotensin coenzyme was 71, which is abnormal. Copper levels of 119, which is under normal limits. ANA comprehensive was overall negative   Thyroid antimicrosomal antibody was less than 6, which is normal. Thyroid-stimulating immunoglobulin was 22%, which is also normal. Zinc in serum was 69, which is normal.   EKG: Normal sinus. No ST depression or elevation. A slight prolongation of QT, which is likely  secondary to psychiatric medications.   Chest x-ray: Mild patchy left basilar opacity with atelectasis.   MRA of the brain without contrast showed unremarkable MRA. Most of the circulation of the brain seems to be within normal limits. Brain MRI showed no evidence of acute intracranial abnormality. There is a new 6 mm focus of T2 signal abnormality in the subcortical right parietal lobe. Nonspecific considerations included sequela of a small vessel ischemic insult, sequela of interval trauma vasculitis or demyelination; however, no other lesions are seen or are identified, suggesting of demyelination. Again, this test was discussed with Dr. Melrose Nakayama. Dr. Melrose Nakayama  did not think this was of significance and he did not offer any further followup to evaluate for demyelination.  This is not MS at this moment. In the future though, if symptoms recur she will have other followupwith neurology  MRI of the whole spine shows sacral insufficiency fracture bilaterally, which appears acute or subacute with bone morrow edema, correlated with pain and injury. No other acute fractures of the cervical, thoracic or lumbar spine. No evidence of metastatic disease. There is mild degenerative disk disease present in the spine. No neural  impingement  related to degenerative changes. No evidence of cord compression or cord edema.   HOSPITAL COURSE:  Per problems:  1.  Leg weakness, previously diagnosed with cerebellar ataxia with gaze nystagmus. The patient did not have any possitive test , for what this diagnosis is ruled out.   2.  Symptoms mostly resolved. The patient has some residual weakness, but she has been able to work with physical therapy, and then by the day of discharge she was independent, being able to get up, move around even to the bathroom,by dischage she was able to d it  without any problems.  3.  We tried to work out the possibility of home health or maybe outpatient therapy. The patient refused  I agree with this; that the patient had significant improvement during this hospitalization.  4.  The patient has outpatient therapy scheduled after she is discharged from the hospital.  5.  As far as her weakness of the left lower extremity,  mostly the patient had significant improvement of her symptoms.  6.  The case was discussed with Dr. Melrose Nakayama in multiple sessions, with the family in multiple sessions for long time trying to reassure the family and tried to work with them as far as finding a diagnosis.   7.  The main issue was that the patient's family mostly was not very happy with not getting an answer and a firm diagnosis.    8.  Long discussions were focused on the fact that some of her symptoms might not have a clear cause and they might not have a single cause that you can pinpoint and name and give a full diagnosis with treatment and a prognosis. 9.  It seems like overall this patient had a chronic   disease that has been waxing and waning with a history of rheumatoid arthritis. This could be secondary to just her baseline autoimmune disease, debilitating disease  lack of reserve of energy, if the patient had a significant exacerbation of rheumatoid arthritis or an acute  illness, either viral infection or  bacterial infection, and the patient will get  decompensated very quickl .  She also has liver disease, which could be playing a big role on this. 10. The patient also had polypharmacy of multiple medications that could have secondary effects including that weakness that the patient was experiencing,could be secondary to the meds that she is taking 11.  The patientwas on telemetry  with no signs of ventricular arrhythmias.   12.  This is also secondary to medication management and needs to be followed up by Psychiatry or whoever is prescribing this medication.   13.  Other reasons could be psychiatric, as the patient has multiple psychiatric problems and medications, depression, anxiety and bipolar.  The patient recognizes  that she has been under a large amount of stress, especially with her husband's job. Her husband seems to be very anxious.  The patient feels stressed out by her husband's job situation and also by her husband's care of her, which is appropriate, but the patient feels overwhelmed sometimes with how concern he is. So, overall, we were not able to get a final single diagnosis, but I am convinced that her symptoms could be due to, as mentioned above, her multiple medical comorbidities, medications, and stress from family situations. The patient needs to follow up with Gastroenterology, continue rifaximin, monitor ammonia levels as needed. 14.  Follow up with Neurology in the near future, especially if symptoms recur. 15.  The patient had hypothyroidism. She was found to have a low TSH and a low T4 .  There was nopituotary lesions on the MRI.  Marland Kitchen  Recommendation to follow up with Dr. Gabriel Carina  as the patient already has a scheduled followup with her.   16.  The patient is discharged in good condition. I spent about 75 minutes discharging this patient on 08/22/2013.   ____________________________ [place] Sink, MD rsg:dmm D: 08/25/2013 08:18:00 ET T: 08/25/2013 09:32:22  ET JOB#: [id number]  cc: [facility] Lavera Guise, MD Doneta Public. Melrose Nakayama, MD [place] Sink, MD, <Dictator>    Maysin Carstens America Brown MD ELECTRONICALLY SIGNED 08/29/2013 6:55

## 2014-11-21 NOTE — H&P (Signed)
PATIENT NAME:  Jamie Bennett, Jamie Bennett MR#:  [id number] DATE OF BIRTH:  Sep 02, 1960  DATE OF ADMISSION:  01/23/2014  IDENTIFYING INFORMATION AND CHIEF COMPLAINT: A 54 year old woman with a history of chronic mood instability and alcohol abuse who came to the Emergency Room, chief complaint, "My doctor told me to get ECT."   HISTORY OF PRESENT ILLNESS: Information obtained from the patient and the chart. The patient reports that within the last week or so her behavior and mood have been getting more erratic. She drove to [place] on her own to visit a sick family member. She got lost along the way and became confused. She was picked up by police and had to be driven back to [place] by her husband and son. Since coming back she has felt like her mood is more depressed. She went to see her usual psychiatrist, Dr. Toy Care, who recommended that she come to the Emergency Room to consider admission and possibly ECT. The patient reports that her mood stays very anxious and depressed all the time, also having up and downs. Anxiety attacks are frequent. Sleep is poor. She feels somehow generally hopeless and negative about herself and her life. Denies any recent hallucinations. Has vague suicidal ideation and wished that she were dead. No homicidal ideation. She states she has not been drinking any alcohol since she was here in the Emergency Room and in the hospital last time. She says she has been compliant with her usual prescribed outpatient medicine but does not think it is helping.   PAST PSYCHIATRIC HISTORY: The patient has been seeing Dr. Toy Care for quite some time. The patient has been on multiple antidepressant medications as well as mood stabilizers and antipsychotics. Does not find that anything has had a really consistent benefit. She takes standing dose benzodiazepines, at least 0.5 mg of clonazepam 3-4 times a day plus Xanax at night.   FAMILY HISTORY: Positive for depression.   SUBSTANCE ABUSE HISTORY:  The patient has been drinking heavily recently. When she was here in the hospital last time just within the last month, she was intoxicated on presentation and the alcohol appeared to possibly be her primary problem. The patient says she has not been drinking now since then.   MEDICAL HISTORY: She has a history of hypothyroidism, which was treated with Synthroid, history of a diagnosis of cirrhosis in the past, gastric reflux symptoms, and ulcers.   CURRENT MEDICATIONS: Pristiq 50 mg once a day, pantoprazole 40 mg once a day, sucralfate 1 gram 3 times a day plus at bedtime, trazodone 50-100 at bedtime p.r.n. sleep, ursodiol 600 mg twice a day, levothyroxine 0.1 mg once a day, lactulose 10 grams twice a day, clonazepam 0.5 mg 3 to 4 times a day, vitamin D 4000 units a day.   ALLERGIES: No known drug allergies.   REVIEW OF SYSTEMS: Mood instability, depression, sadness, tearfulness, passive suicidal ideation. Chronic generalized anxiety.   MENTAL STATUS EXAMINATION: A disheveled woman, looks older than her stated age, cooperative with the interview. Eye contact intermittent. Psychomotor activity fidgety. Speech is normal in rate, tone, and volume. Affect is histrionic, very demonstrative. Mood is stated as being terrible. Thoughts are lucid but also somewhat histrionic. Denies auditory or visual hallucinations. Denies suicidal or homicidal intent currently but has a vague homicidal ideation. Alert and oriented x 4. Short and long-term memory intact.   PHYSICAL EXAMINATION:  GENERAL: The patient does not appear to be in acute distress.  SKIN: No skin is diffusely dry  as is the hair.  HEENT: Pupils equal and reactive. Face symmetric. Oral mucosa dry.  NECK AND BACK: Nontender. Normal gait. Full range of motion at extremities.  NEUROLOGIC: Strength and reflexes symmetric and normal throughout. Cranial nerves symmetric and normal.  LUNGS: Clear, no wheezes.  HEART: Regular rate and rhythm.  ABDOMEN:  Soft, nontender, normal bowel sounds.  VITAL SIGNS: Most recent temperature 97.6, pulse 62, respirations 18, blood pressure 142/78.   LABORATORY RESULTS: Salicylates and acetaminophen negative. Alcohol level negative. Chemistry panel shows low calcium 8.2, elevated chloride 112. CBC shows a low hematocrit 31.7, hemoglobin 10.7, white count normal.   ASSESSMENT: A 54 year old woman presented to the Emergency Room requesting hospitalization for consideration of ECT. Right now acute ECT is not possible, as I will be away from the hospital all next week so there will be a week and a half gap before we could start ECT. In any case, I am not at all certain that she would be an appropriate candidate. Her anxiety is more of her overwhelming symptom and I think her substance abuse and medical problems are major contributors. Additionally, I think that her chronic heavy use of benzodiazepines on top of her cirrhosis is contributing to confusion, which makes her anxiety worse. I do think that at this point, she seems so overwhelmed and hopeless that I am concerned about her safety and we should admit her to the hospital.   TREATMENT PLAN: Admit to psychiatry. I have told her I am going to start tapering her off of some of her benzodiazepines, clonazepam dose cut in half. Xanax substituted with Restoril 7.5 mg p.r.n. at night. Continue other medications. Engage her in groups and activities on the unit and monitor vital signs. Try and get her tapered off the benzodiazepines.   DIAGNOSIS, PRINCIPAL AND PRIMARY:   AXIS I: Depression, not otherwise specified, rule out bipolar disorder.   SECONDARY DIAGNOSES:  AXIS I: Alcohol dependence, in early remission.   AXIS II: Histrionic personality disorder.   AXIS III: Cirrhosis, hypothyroidism, history of ulcers.   AXIS IV: Moderate to severe from poor social functioning and lack of resources.   AXIS V: Functioning at time of evaluation 35.     ____________________________ Gonzella Lex, MD jtc:lt D: 01/24/2014 01:35:00 ET T: 01/24/2014 08:31:13 ET JOB#: [id number]  cc: Gonzella Lex, MD, <Dictator> Gonzella Lex MD ELECTRONICALLY SIGNED 02/02/2014 22:44

## 2014-11-21 NOTE — Consult Note (Signed)
PATIENT NAME:  Jamie Bennett, DELANO MR#:  [id number] DATE OF BIRTH:  1960/10/07  DATE OF CONSULTATION:  01/28/2014  REFQUESTING PHYSICIAN:  Dr. Bridgette Habermann   CONSULTING PHYSICIAN:  Cheral Marker. Ola Spurr, MD  REASON FOR CONSULTATION:  Shingles.   HISTORY OF PRESENT ILLNESS: This is a pleasant 54 year old female with history of psychiatric illness who also has a history of alcohol abuse. She was admitted through the Emergency Room for worsening depression. She, while was inpatient, found to have an outbreak on her back and right chest that is consistent with zoster. She was admitted to the hospitalist service and started on Valtrex.  She is on isolation. We are consulted for further evaluation and management.   PAST MEDICAL HISTORY:  1. Depression.  2. Bipolar disorder.  3. Rheumatoid arthritis on room air.  4. Hypothyroidism.  5. Hepatic encephalopathy.  6. Cirrhosis.   SOCIAL HISTORY:  1. Gastric bypass surgery.  2. History of bile duct stenting.  3. Cholecystectomy.   SOCIAL HISTORY: Apparently, she was an alcoholic. No tobacco use.  She lives with her husband.   FAMILY HISTORY: Positive for Parkinson's and diabetes.   ALLERGIES: No known drug allergies.   MEDICATIONS: Outpatient medications are reviewed.  She was on Humira monthly.  Finally, she was also on Valtrex.   REVIEW OF SYSTEMS:  Eleven systems reviewed and negative except as per HPI.   PHYSICAL EXAMINATION:  VITAL SIGNS: Temperature 98.7, pulse 67, blood pressure 126/84, respirations 18, saturations 95% on room air.  GENERAL: She is pleasant. She is somewhat confused and anxious-appearing.  HEENT: Pupils equal, round, and reactive to light and accommodation. Extraocular movements are intact. Sclerae anicteric. Oropharynx clear.  NECK: Supple.  HEART: Regular.  LUNGS: Clear to auscultation.  ABDOMEN: Soft, nontender, nondistended.  EXTREMITIES: No clubbing, cyanosis or edema.  SKIN: She has a dermatomal eruption of a  small vesicles  on her right  and her thoracic dermatome. She also has on her right posterior neck a small lymph node that is slightly tender  LABORATORY DATA: White blood count is 5.1, hemoglobin 10.2, platelets of 263,000.   Urinalysis is limited to do VZV. IgM antibodies are negative.   IMPRESSION: A 55 year old with chronic psychiatric illness as well as rheumatoid arthritis on Humira admitted with dermatomal zoster. She is considered immunocompromised because of the Humira.  According to CDC guidelines, immunocompromised patients even with just 1 dermatome of shingles should remain isolated.  This would include airborne isolation.  However, patient will need transfer back to the psychiatric unit once stable. The fact that her IgM is negative does not at all rule out that this is an eruption of zoster. We can await cultures that were sent, but this is undoubtedly shingles.   RECOMMENDATIONS:  1. Continue Valtrex.  2. Treat neuropathic pain with gabapentin and pain medications as you are doing. I discussed with infection prevention how to manage this patient as she obviously needs inpatient psychiatric treatment.  It is my opinion she is relatively low risk of transmission.  3. Thank you for the consult. I would be glad to follow with you.   ____________________________ Cheral Marker. Ola Spurr, MD dpf:dd/am D: 01/28/2014 21:12:00 ET T: 01/29/2014 02:28:03 ET JOB#: [id number]  cc: Cheral Marker. Ola Spurr, MD, <Dictator> Talmadge Ganas Ola Spurr MD ELECTRONICALLY SIGNED 02/16/2014 21:13

## 2014-11-21 NOTE — H&P (Signed)
PATIENT NAME:  Jamie Bennett, Jamie Bennett MR#:  [id number] DATE OF BIRTH:  [birth year]  DATE OF ADMISSION:  12/08/2013  IDENTIFYING INFORMATION AND CHIEF COMPLAINT: A 54 year old woman who came to the hospital saying that she had recently been drinking heavily and having suicidal thoughts.   CHIEF COMPLAINT: "I've been more depressed and anxious."   HISTORY OF PRESENT ILLNESS: Information obtained from the patient and the chart. She reports that she has been having worsening depressive symptoms that have been going on for months. Her mood feels down most of the time. She has a sense of hopelessness. She also has anxiety symptoms with constant feeling of shaking and panic. Poor sleep. She indicates that she overdosed on her Prozac and was considering overdosing on her Wellbutrin. She has been drinking recently several times a week using up to 2 bottles of liquor per week. She has been seeing her outpatient psychiatrist, Dr. Robina Ade, in [place].   PAST PSYCHIATRIC HISTORY: The patient has had previous psychiatric hospitalizations. Years ago, she had been treated for depression and then later was told that she had bipolar disorder. She does have a past history of suicide attempts. Had not been hospitalized in several years, however. Recently, however, within the last year, she has had a hospitalization at [facility] for medical concerns. At that time, she was started on antidepressants.   SUBSTANCE ABUSE HISTORY: Has been recently drinking more heavily. Has had a past history of intermittently abusing prescription medicines.   PAST MEDICAL HISTORY: The patient has rheumatoid arthritis, chronic pain in her joints, osteoarthritis as well.   SOCIAL HISTORY: She lives with her husband. Not currently employed. Sees Dr. Robina Ade for outpatient treatment. 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   FAMILY HISTORY: None noted.   CURRENT MEDICATIONS: Wellbutrin 100 mg 3 times a  day, Fosamax 70 mg weekly, vitamin D3 at 4000 units a day, Klonopin 1 mg 4 times a day, Prozac 20 mg per day, lactulose 20 mL twice a day, Synthroid 100 mcg per day, Claritin 5 mg twice a day as needed, multivitamin once a day, Nexium 40 mg per day, Carafate 1 gram twice a day, Actigall 600 mg twice a day.   ALLERGIES: No known drug allergies.   REVIEW OF SYSTEMS: Depressed mood and anxiety. Passive suicidal thoughts without any active intent. No hallucinations. Chronic pain in her joints. Chronic fatigue, poor sleep at night. Otherwise negative.   MENTAL STATUS EXAMINATION: Slightly disheveled woman, looks older than her stated age. Cooperative with the interview. Good eye contact. Normal psychomotor activity. Speech normal rate, tone and volume. Affect euthymic, reactive, appropriate. Mood stated as being anxious. Thoughts are lucid without loosening of associations. No evidence of delusions. Denies auditory or visual hallucinations. Denies any homicidal ideation. Suicidal ideation improving. Judgment and insight adequate. Alert and oriented x4. Normal fund of knowledge. Short and long-term memory intact.   PHYSICAL EXAMINATION: SKIN: No acute skin lesions identified.  HEENT: Pupils equal and reactive. Face symmetric. Normal oral mucosa some Eric.  NECK AND BACK: Nontender. Decreased range of motion at most joints, especially upper extremities.  NEUROLOGIC: Gait slightly antalgic. Strength and reflexes symmetric, decreased throughout.  LUNGS: Clear without wheezes.  HEART: Regular rate and rhythm.  ABDOMEN: Soft, nontender, normal bowel sounds.  VITAL SIGNS: Blood pressure 139/89, respirations 20, pulse 107, temperature 98.6.   ASSESSMENT: A 54 year old woman with a history of major depression versus bipolar disorder, but it sounds  more like depression currently. No psychotic features. Recent substance abuse problems that have been making her mood worse and making her feel more out of control.  Currently not showing acute symptoms of withdrawal. Not acutely psychotic. Working Product/process development scientist with treatment plan.   TREATMENT PLAN: Continue the fluoxetine and Wellbutrin. We discussed the possibility of adding medication for depression, but at this point, I would defer that to working on getting her sober and getting her committed to longer term treatment of her anxiety and depression. Continue the clonazepam but at a lower dose of 0.5 mg 4 times a day. Include in daily individual and group psychotherapy. Work on discharge planning with family.   LABORATORY RESULTS: Drug screen positive for MDMA. Benzodiazepines also positive. Alcohol level negative. Chloride slightly elevated at 113, ALT low at 11, total albumin low at 3.3. Hematology panel: Low hematocrit at 31.4; otherwise, unremarkable.    DIAGNOSIS, PRINCIPAL AND PRIMARY:  AXIS I: Major depression, recurrent, severe.   SECONDARY DIAGNOSES: AXIS I: Alcohol abuse.  AXIS II: Deferred.  AXIS III:  1. Rheumatoid arthritis.  2. Hypothyroidism.  3. History of liver disease, possibly related to previous medication treatment.  AXIS IV: Moderate from chronic illness.  AXIS V: Functioning at time of evaluation is 38.      ____________________________ Gonzella Lex, MD jtc:lm D: 12/08/2013 22:52:53 ET T: 12/09/2013 00:12:52 ET JOB#: [id number]  cc: Gonzella Lex, MD, <Dictator> Gonzella Lex MD ELECTRONICALLY SIGNED 12/12/2013 15:34

## 2014-11-24 DIAGNOSIS — R748 Abnormal levels of other serum enzymes: Secondary | ICD-10-CM | POA: Diagnosis not present

## 2014-11-24 DIAGNOSIS — Z79899 Other long term (current) drug therapy: Secondary | ICD-10-CM | POA: Diagnosis not present

## 2014-11-24 DIAGNOSIS — E559 Vitamin D deficiency, unspecified: Secondary | ICD-10-CM | POA: Diagnosis not present

## 2014-11-29 NOTE — Consult Note (Signed)
PATIENT NAME:  Jamie Bennett, Jamie Bennett MR#:  [id number] DATE OF BIRTH:  29-Jan-1961  DATE OF CONSULTATION:  09/30/2014  REFERRING PHYSICIAN:   CONSULTING PHYSICIAN:  Gonzella Lex, MD  HISTORY OF PRESENT ILLNESS: See consult from yesterday. A 54 year old woman with a history of recurrent mood symptoms as well as alcohol abuse, who came into the hospital after having cut her arm up while intoxicated. Today, she reports that she is feeling better. Says her mood is more stable. She feels more in control. Denies any suicidality, denies any psychotic symptoms. Not reporting any active symptoms of alcohol withdrawal. Says she has a positive plan for taking care of herself in the future.   REVIEW OF SYSTEMS: Only mild discomfort in the left arm. Otherwise, no acute physical complaints. Mood is better. Denies suicidal ideation. Denies hallucinations.   MENTAL STATUS EXAMINATION: Reasonably well-groomed woman, looks her stated age, cooperative with the interview. Good eye contact, normal psychomotor activity. Speech normal rate, tone and volume. Affect reactive, euthymic, appropriate. Mood stated as being all right. Thoughts are lucid without loosening of associations. Denies auditory or visual hallucinations. Denies suicidal or homicidal ideation. Judgment and insight are adequate, although she still struggles to show true insight about her alcohol abuse.   ASSESSMENT: The patient does not meet commitment criteria. No longer having any suicidal thoughts. Really does not seem to have had any thought of wanting to kill herself at the time. The patient has an active alcohol problem, which she is avoiding dealing with but does not need hospital level treatment for it. Counseling completed about the importance of engaging in substance abuse treatment and stopping her alcohol use. The patient intends to follow up with Dr. Toy Care in [place] and also seek a psychotherapist.   DIAGNOSIS, PRINCIPAL AND PRIMARY:  AXIS I:  Major depression, severe, recurrent.   SECONDARY DIAGNOSES:  AXIS I:  1. Alcohol abuse, moderate.  2. Benzodiazepine abuse.  AXIS II: Borderline and histrionic features.   AXIS III:  1. Hypothyroidism.  2. History of arthritis.  3. Multiple lacerations.   ____________________________ Gonzella Lex, MD jtc:je D: 09/30/2014 13:29:34 ET T: 09/30/2014 13:57:11 ET JOB#: [id number]  cc: Gonzella Lex, MD, <Dictator> Gonzella Lex MD ELECTRONICALLY SIGNED 10/06/2014 10:38

## 2014-11-29 NOTE — Consult Note (Signed)
PATIENT NAME:  Jamie Bennett, Jamie Bennett MR#:  [id number] DATE OF BIRTH:  Mar 29, 1961  DATE OF CONSULTATION:  09/29/2014  REFERRING PHYSICIAN:   CONSULTING PHYSICIAN:  Gonzella Lex, MD  IDENTIFYING INFORMATION AND REASON FOR CONSULTATION: A 54 year old woman with a history of mental health problems and substance abuse.   CHIEF COMPLAINT: "I've had enough."   HISTORY OF PRESENT ILLNESS: The patient cut up her left arm pretty badly today. Multiples cuts, both superficial and a little deeper. She tells a rambling story about the stresses that she is under, focusing mainly on problems with her in-laws. Apparently, she and her husband just placed her mother-in-law in a nursing home and now there is an argument breaking out among the family. The patient tells me she has not slept in 2 nights. Her mood has been feeling bad for months and has been getting worse. She is also stressed out about her husband being out of work and her husband's anxiety problems. The patient says she is still taking her trazodone, her Effexor, her Rexulti, prescribed by Dr. Toy Care. She says she has been having thoughts of killing herself intermittently but only passively, but today really wanted to die when she cut her arm up. She admits that she had been drinking at the time, although she minimizes it saying it was just one little drink. Denies that she has been abusing other drugs.   PAST PSYCHIATRIC HISTORY: History of mood instability with diagnoses of depression and possible posttraumatic stress disorder in the past. Has a history of cutting and suicide attempts. Also has a significant history of alcohol abuse as well as abuse of prescription benzodiazepines. She was here in the hospital last summer. She is followed by Dr. Toy Care in the community. At one point, she had been referred to see me for ECT, but circumstances did not work out for it and it did not seem appropriate at the time.   PAST MEDICAL HISTORY: The patient has a history  of rheumatoid arthritis. Evidently, she is being prescribed Provigil for this for some reason. Also, has hypothyroidism and takes Synthroid regularly, gastric reflux symptoms.   SOCIAL HISTORY: Lives with her husband. He is out of work right now, so both of them are at home. Money is stressful.   FAMILY HISTORY: Denies any family history of mental illness.   CURRENT MEDICATIONS: Venlafaxine extended release 225 mg a day; trazodone 150 mg at night; pantoprazole 40 mg in the morning; Rexulti 2 mg in the morning; Provigil 200 mg once or twice a day, I am unclear on that; Synthroid 100 mcg in the morning; Xanax 1 mg q. 6 hours p.r.n. for anxiety, she says she only takes about 1 a day.   ALLERGIES: No known drug allergies.   REVIEW OF SYSTEMS: Depressed mood. Anxious. Poor sleep. Suicidal ideation. No hallucinations. Pain in the left arm, also a headache. No other physical complaints.   MENTAL STATUS EXAMINATION: Slightly disheveled woman, looks her stated age, cooperative with the interview. Poor eye contact. Psychomotor activity calm. Speech is normal rate, tone, and volume. Affect is somewhat blunted. Mood is stated as being fed up and anxious. Thoughts are a bit rambling and disorganized. Not bizarre. No hallucinations evident or obvious psychotic thinking, but she is rambling when she tells a story. Gets off topic easily. She endorses recent suicidal ideation. No homicidal ideation. She is alert and oriented x 4. Can repeat 3 objects immediately, remembers 1 of them at 3 minutes. Judgment and insight poor.  Intelligence probably somewhat impaired by her history of liver cirrhosis as well.   LABORATORY RESULTS: Chemistry panel: Her chloride is slightly up. The rest of her labs are normal. Her total protein and her albumin look good. Alcohol level was 133 on admission. CBC normal. Urinalysis uninfected. Drug screen negative.   VITAL SIGNS: Blood pressure 109/59, respirations 20, pulse 77, temperature  97.7.   ASSESSMENT: A 54 year old woman with a history of mood instability and substance abuse. She claims she had been keeping the substance abuse under good control recently, but admits she had a drink today. She got in an argument with one of her husbands family members on the phone and then cut up her arm badly. Right now, she is more concerned with her headache. She noncommittal about whether she is having suicidal ideation.   TREATMENT PLAN: No beds to admit her to psychiatry at this time. Continue current outpatient medicines. Reassess in the morning. Gave a dose of Motrin for pain.   DIAGNOSIS, PRINCIPAL AND PRIMARY:  AXIS I: Major depression, severe, recurrent.   SECONDARY DIAGNOSES:  AXIS I: Alcohol abuse, moderate; benzodiazepine abuse, under control.  AXIS II: Borderline and histrionic features.  AXIS III: Hypothyroid, history of arthritis, multiple lacerations self-inflicted to the arm.    ____________________________ Gonzella Lex, MD jtc:bm D: 09/29/2014 22:21:49 ET T: 09/29/2014 23:23:38 ET JOB#: [id number]  cc: Gonzella Lex, MD, <Dictator> Gonzella Lex MD ELECTRONICALLY SIGNED 10/06/2014 10:38

## 2014-11-29 NOTE — H&P (Signed)
PATIENT NAME:  Jamie Bennett, Jamie Bennett MR#:  [id number] DATE OF BIRTH:  [birth year]  DATE OF ADMISSION:  10/12/2014  IDENTIFYING INFORMATION: The patient is a 54 year old Caucasian female, married, from [facility], who currently receives disability for a past history of bipolar disorder and rheumatoid arthritis.   CHIEF COMPLAINT: "I just had breakdown."   HISTORY OF PRESENT ILLNESS: This patient presented to our Emergency Department on 10/09/2014. She was brought in by her husband, due to worsening anxiety and depression. The patient reports that over the last year, she has been undergoing severe stress. She said that over the last 3 months, the stress level has been getting out of control. She explains that her father-in-law recently passed away and now the family is trying to place the patient's mother-in-law in a nursing home. However, this has been a difficult thing, as the mother-in-law has been refusing to move out of her home. Due to these issues, her husband has started suffering from depression and anxiety and he went on short and disability. Once his short term disability expired, he applied for long term disability. However, he has been denied. The patient states that they had been receiving many hospital bills, as they are not being covered by any insurance since he lost it and they are also not receiving his income anymore. As all the family members have been under a lot of stress due to the situation with her mother-in-law, she recently got into a verbal confrontation with her husband's niece. After that confrontation, the patient cut herself with a razor blade multiple times on her wrist, something that she had not done in several years. The patient states she is prescribed with Xanax 1 mg every 6 hours by her outpatient psychiatrist, however, due to her past tendencies to misuse medications, her has been managing all of her medications and has been unwilling to give them to her when she feels  she has needed them. Therefore, on the day of her admission, she had consumed large amounts of wine in order to "self-medicate" her symptoms. She locked herself in the attic and her husband was unable to find her for hours, having to call the police. The patient stated the police showed up to her house with dogs trying to find her and tracking her down. Eventually, she came down from the attic and agreed with being taken to the Emergency Department. Today, the patient is denying suicidality, homicidality, or auditory or visual hallucinations. In terms of bipolar disorder, she claims to have been diagnosed with bipolar disorder years ago. However, she denies any symptoms that are consistent with mania or hypomania. In terms of substance abuse, the patient denies any history of abusing alcohol or illicit substances. She denies smoking and as far as prescription medications go, she does report a past history of misusing them, therefore she is currently allowing her husband to manage her medications.   PAST PSYCHIATRIC HISTORY: She follows up with Dr. Robina Ade in [place]. She has been seeing her for about 12 years. She states she has been diagnosed with bipolar disorder and panic disorder. The patient is not currently seeing a therapist, as she states that she has seen 2 and, "things just did not work out." The patient states that she has been hospitalized in our facility at least 3 times before, the last one being in June. She also has been at [facility] in  [place], [place] one time in the past. As far as suicidal attempts the patient states that  she has misused her medications and at times, has taken more than prescribed, but denies any actual attempts to kill herself. As far as self injury, the patient reports having a history of cutting, but states that she has refrained from cutting for years and her last episode was several years ago.   PAST MEDICAL HISTORY: The patient suffers from rheumatoid  arthritis. She has been told she has hepatomegalia. She had a gallbladder removal and a gastric bypass 13 years ago. She also receives B12 monthly injections due to B12 deficiency.   FAMILY HISTORY: The patient reports her mother suffers from depression, but has not been formally diagnosed. She also said that she has a brother that suffers from anxiety. There is no history of substance abuse or suicide and her family.   SOCIAL HISTORY: The patient is married, has been with her husband for 81 years they have a son together, who is 12 years old. She also has an 67-month-old grandchild that she sees often. The patient worked as an Automotive engineer for several years. She has a bachelor degree in education. She is currently receiving disability for mental illness and rheumatoid arthritis. Her husband is an Chief Financial Officer and, as I mentioned earlier in my dictation, he is out of work and is trying to apply for short-term disability due to issues with depression and anxiety.   LEGAL HISTORY: The patient denies any history of legal problems.   ALLERGIES: NO KNOWN DRUG ALLERGIES.   REVIEW OF SYSTEMS: The patient denies nausea, vomiting, or diarrhea. The rest of the 10 review of systems is negative.   MENTAL STATUS EXAMINATION: The patient is a 54 year old Caucasian female, who appears her stated age. She is disheveled. Behavior is calm, pleasant and cooperative. Psychomotor activity within the normal range. Eye contact within normal range. Speech had regular tone, volume and rate. Thought process is linear and goal-directed. Thought content negative for suicidality and homicidality. Perception negative for psychosis. Mood is described as dysphoric. Her affect is reactive, congruent, appropriate. Insight and judgment fair. On cognitive examination, she is alert and oriented in person, place, time, and situation. Attention and concentration were grossly intact, however, it was not formally tested. Fund of  knowledge appears to be average for her level of education.   PHYSICAL EXAMINATION:  GENERAL: A 54 year old Caucasian female in no acute distress.  VITAL SIGNS: Blood pressure 126/87, pulse 71, temperature 98.1.  MUSCULOSKELETAL: Normal gait, normal muscular tone. No evidence of involuntary movements.   LABORATORY RESULTS: BUN 10, creatinine 0.78, sodium 138, potassium 3.9, calcium 8.7. Alcohol at arrival 166. AST 26, ALT 18. Urine toxicology was negative for all substances. CBC: WBC 4.5, hemoglobin 13.9, hematocrit 42.3, platelet count is 336,000. Urinalysis was clear. Acetaminophen level less than 10 salicylate level less than 4.   DIAGNOSES: Major depressive disorder, borderline major depressive disorder, recurrent, moderate; and borderline personality disorder, rheumatoid arthritis, vitamin B12 deficiency.   ASSESSMENT: 54 year old Caucasian female with symptoms consistent with major depressive disorder and borderline personality disorder, who presented to our Emergency Department due to worsening depression and anxiety, along with self-injurious behaviors and alcohol use.   PLAN:  1. For major depressive disorder, the patient will be continued on Effexor at home. She is prescribed with Effexor-XR 225 mg p.o. daily. However, I have explained to the patient did due to her past history of gastric bypass, absorption is limited for medications that are extended release. Therefore, we will change the Effexor to 75 mg 3 times a day.  2. For excessive, drowsiness, she will be continued on Provigil. She is prescribed with Provigil through her dermatologist. She is currently taking 400 mg in the morning and 200 mg in the afternoon which is about the recommended dose, however, the patient stated that she has been taking this for several years with success.  3. For insomnia, the patient has been using trazodone 50 mg p.o. at bedtime, which she states helps her first fall asleep. She does not believe the  modafinil has affected her sleep and would like to maintain the current regimen with these agents. 4. For anxiety, the patient is prescribed with alprazolam 1 mg every 6 hours as needed, which is managed by her husband. In the hospital, we will refrain from using alprazolam. I will start clonazepam 0.5 mg every 8 hours as needed. 5. For vitamin B12 deficiency. She will be continued on B12 injection monthly  6. For hypothyroidism, she will be continued on Synthroid 0.1 mg p.o. daily.  7. For gallstone dissolution, she will be continued on Ursodiol, which she is taking 600 mg b.i.d. These nonformulary medications, however, we are utilizing the patient's home medications.  8. I will order a TSH, B12 level.   PLAN: Once stable, this patient will be discharged back to her home with her husband and she will be set up to follow up with Dr. Robina Ade and we will attempt to make an appointment with a therapist in the [facility] area.   ____________________________ Hildred Priest, MD ahg:ap D: 10/13/2014 15:04:51 ET T: 10/13/2014 15:29:20 ET JOB#: [id number]  cc: Hildred Priest, MD, <Dictator> Rhodia Albright MD ELECTRONICALLY SIGNED 10/22/2014 13:25

## 2014-11-29 NOTE — Consult Note (Signed)
PATIENT NAME:  ZAILEE, VALLELY MR#:  [id number] DATE OF BIRTH:  [birth year]  DATE OF CONSULTATION:  10/10/2014  CONSULTING PHYSICIAN:  Josha Weekley K. Franchot Mimes, MD  PLACE OF DICTATION: [facility] Emergency Room, [place], [place].  AGE: 54 years.  SEX: Female.  RACE: White.   SUBJECTIVE: The patient was seen in consultation at [facility] Emergency Room. The patient is a 54 year-old white female, not employed, and has been on [place] for bipolar disorder and anxiety and rheumatoid arthritis. The patient is married, and, lives with her husband. The patient has a long history of mental illness and had several inpatient hospitalizations for the same. The patient come to the [facility] Emergency Room with a chief complaint of "I feel stressed out and very anxious and not able to deal with it anymore."    HISTORY OF PRESENT ILLNESS: The patient reports that her father-in-law died and she had to take care of him in the hospital and at home for quite some time.  [name] has dementia and lives at [place] and she calls her house all the time and she yells at the patient and not at her son, which bothers her a great deal.    PAST PSYCHIATRIC HISTORY: History of inpatient on psychiatry on 3 occasions at [facility] and 1 occasion at [facility]. Longest period inpatient hospitalization on psychiatry 5 days. No history of suicidal attempts. Being followed by Dr. Chucky May for several years. Last appointment was 2 weeks ago. Next appointment coming up in 3 or 4 weeks.    ALCOHOL AND DRUGS: Denies drinking alcohol. Denies street or prescription drug abuse.   MENTAL STATUS EXAMINATION: The patient is seen lying in bed. Alert and oriented, cooperative, very anxious, talking about her problems.  Affect is flat with mood depressed.  Admits feeling depressed. Admits feeling hopeless and helpless over the current situation and very anxious. Does not appear to respond to internal stimuli. Denies suicidal or  homicidal plans and wants to get help. Insight and judgment guarded. Impulse controlled.    IMPRESSION: Bipolar disorder, type 2, depressed.    RECOMMENDATIONS: I recommend inpatient on psychiatry for close observation and management. She will be started on her modafinil and Klonopin on p.r.n. basis and trazodone, and wait for a bed to come available and then she will be admitted.   ____________________________ Wallace Cullens. Franchot Mimes, MD skc:at D: 10/10/2014 18:02:00 ET T: 10/10/2014 18:23:39 ET JOB#: [id number]  cc: Arlyn Leak K. Franchot Mimes, MD, <Dictator> Dewain Penning MD ELECTRONICALLY SIGNED 10/11/2014 16:36

## 2014-12-01 DIAGNOSIS — J209 Acute bronchitis, unspecified: Secondary | ICD-10-CM | POA: Diagnosis not present

## 2014-12-01 DIAGNOSIS — R05 Cough: Secondary | ICD-10-CM | POA: Diagnosis not present

## 2014-12-01 DIAGNOSIS — J309 Allergic rhinitis, unspecified: Secondary | ICD-10-CM | POA: Diagnosis not present

## 2014-12-09 DIAGNOSIS — Z1231 Encounter for screening mammogram for malignant neoplasm of breast: Secondary | ICD-10-CM | POA: Diagnosis not present

## 2014-12-14 ENCOUNTER — Inpatient Hospital Stay: Payer: Managed Care, Other (non HMO) | Attending: Internal Medicine

## 2014-12-29 DIAGNOSIS — Z79899 Other long term (current) drug therapy: Secondary | ICD-10-CM | POA: Diagnosis not present

## 2015-01-14 ENCOUNTER — Inpatient Hospital Stay: Payer: Medicare Other | Attending: Internal Medicine

## 2015-01-14 DIAGNOSIS — D649 Anemia, unspecified: Secondary | ICD-10-CM | POA: Diagnosis not present

## 2015-01-14 DIAGNOSIS — Z79899 Other long term (current) drug therapy: Secondary | ICD-10-CM | POA: Diagnosis not present

## 2015-01-14 DIAGNOSIS — D519 Vitamin B12 deficiency anemia, unspecified: Secondary | ICD-10-CM

## 2015-01-14 DIAGNOSIS — E538 Deficiency of other specified B group vitamins: Secondary | ICD-10-CM | POA: Insufficient documentation

## 2015-01-14 MED ORDER — CYANOCOBALAMIN 1000 MCG/ML IJ SOLN
1000.0000 ug | Freq: Once | INTRAMUSCULAR | Status: AC
  Administered 2015-01-14: 1000 ug via INTRAMUSCULAR
  Filled 2015-01-14: qty 1

## 2015-02-02 DIAGNOSIS — R87810 Cervical high risk human papillomavirus (HPV) DNA test positive: Secondary | ICD-10-CM | POA: Diagnosis not present

## 2015-02-02 DIAGNOSIS — Z0001 Encounter for general adult medical examination with abnormal findings: Secondary | ICD-10-CM | POA: Diagnosis not present

## 2015-02-02 DIAGNOSIS — Z124 Encounter for screening for malignant neoplasm of cervix: Secondary | ICD-10-CM | POA: Diagnosis not present

## 2015-02-02 DIAGNOSIS — D519 Vitamin B12 deficiency anemia, unspecified: Secondary | ICD-10-CM | POA: Diagnosis not present

## 2015-02-02 DIAGNOSIS — M25552 Pain in left hip: Secondary | ICD-10-CM | POA: Diagnosis not present

## 2015-02-02 DIAGNOSIS — E039 Hypothyroidism, unspecified: Secondary | ICD-10-CM | POA: Diagnosis not present

## 2015-02-02 DIAGNOSIS — M0579 Rheumatoid arthritis with rheumatoid factor of multiple sites without organ or systems involvement: Secondary | ICD-10-CM | POA: Diagnosis not present

## 2015-02-02 DIAGNOSIS — M810 Age-related osteoporosis without current pathological fracture: Secondary | ICD-10-CM | POA: Diagnosis not present

## 2015-02-02 DIAGNOSIS — F329 Major depressive disorder, single episode, unspecified: Secondary | ICD-10-CM | POA: Diagnosis not present

## 2015-02-05 ENCOUNTER — Other Ambulatory Visit: Payer: Self-pay

## 2015-02-05 ENCOUNTER — Inpatient Hospital Stay: Payer: Medicare Other | Attending: Hematology and Oncology

## 2015-02-05 DIAGNOSIS — F419 Anxiety disorder, unspecified: Secondary | ICD-10-CM | POA: Diagnosis not present

## 2015-02-05 DIAGNOSIS — K5900 Constipation, unspecified: Secondary | ICD-10-CM | POA: Diagnosis not present

## 2015-02-05 DIAGNOSIS — Z7952 Long term (current) use of systemic steroids: Secondary | ICD-10-CM | POA: Diagnosis not present

## 2015-02-05 DIAGNOSIS — Z79899 Other long term (current) drug therapy: Secondary | ICD-10-CM | POA: Insufficient documentation

## 2015-02-05 DIAGNOSIS — M818 Other osteoporosis without current pathological fracture: Secondary | ICD-10-CM | POA: Diagnosis not present

## 2015-02-05 DIAGNOSIS — Z9884 Bariatric surgery status: Secondary | ICD-10-CM | POA: Insufficient documentation

## 2015-02-05 DIAGNOSIS — F319 Bipolar disorder, unspecified: Secondary | ICD-10-CM | POA: Insufficient documentation

## 2015-02-05 DIAGNOSIS — D649 Anemia, unspecified: Secondary | ICD-10-CM

## 2015-02-05 DIAGNOSIS — D509 Iron deficiency anemia, unspecified: Secondary | ICD-10-CM | POA: Diagnosis not present

## 2015-02-05 DIAGNOSIS — R5383 Other fatigue: Secondary | ICD-10-CM | POA: Diagnosis not present

## 2015-02-05 DIAGNOSIS — M069 Rheumatoid arthritis, unspecified: Secondary | ICD-10-CM | POA: Diagnosis not present

## 2015-02-05 DIAGNOSIS — E079 Disorder of thyroid, unspecified: Secondary | ICD-10-CM | POA: Diagnosis not present

## 2015-02-05 DIAGNOSIS — E538 Deficiency of other specified B group vitamins: Secondary | ICD-10-CM | POA: Diagnosis not present

## 2015-02-05 LAB — IRON AND TIBC
Iron: 86 ug/dL (ref 28–170)
Saturation Ratios: 19 % (ref 10.4–31.8)
TIBC: 444 ug/dL (ref 250–450)
UIBC: 358 ug/dL

## 2015-02-05 LAB — COMPREHENSIVE METABOLIC PANEL
ALT: 115 U/L — ABNORMAL HIGH (ref 14–54)
AST: 64 U/L — ABNORMAL HIGH (ref 15–41)
Albumin: 3.7 g/dL (ref 3.5–5.0)
Alkaline Phosphatase: 150 U/L — ABNORMAL HIGH (ref 38–126)
Anion gap: 5 (ref 5–15)
BUN: 11 mg/dL (ref 6–20)
CO2: 22 mmol/L (ref 22–32)
Calcium: 8.6 mg/dL — ABNORMAL LOW (ref 8.9–10.3)
Chloride: 109 mmol/L (ref 101–111)
Creatinine, Ser: 0.85 mg/dL (ref 0.44–1.00)
GFR calc Af Amer: >60 mL/min (ref >60)
GFR calc non Af Amer: >60 mL/min (ref >60)
Glucose, Bld: 183 mg/dL — ABNORMAL HIGH (ref 65–99)
Potassium: 3.8 mmol/L (ref 3.5–5.1)
Sodium: 136 mmol/L (ref 135–145)
Total Bilirubin: 0.4 mg/dL (ref 0.3–1.2)
Total Protein: 6.9 g/dL (ref 6.5–8.1)

## 2015-02-05 LAB — FERRITIN: Ferritin: 38 ng/mL (ref 11–307)

## 2015-02-05 LAB — CBC
HCT: 38.8 % (ref 35.0–47.0)
Hemoglobin: 12.7 g/dL (ref 12.0–16.0)
MCH: 31.9 pg (ref 26.0–34.0)
MCHC: 32.6 g/dL (ref 32.0–36.0)
MCV: 97.7 fL (ref 80.0–100.0)
Platelets: 212 10*3/uL (ref 150–440)
RBC: 3.97 MIL/uL (ref 3.80–5.20)
RDW: 12.9 % (ref 11.5–14.5)
WBC: 3.3 10*3/uL — ABNORMAL LOW (ref 3.6–11.0)

## 2015-02-05 LAB — VITAMIN B12: Vitamin B-12: 619 pg/mL (ref 180–914)

## 2015-02-08 ENCOUNTER — Other Ambulatory Visit: Payer: Self-pay

## 2015-02-08 DIAGNOSIS — D649 Anemia, unspecified: Secondary | ICD-10-CM

## 2015-02-08 DIAGNOSIS — Z9884 Bariatric surgery status: Secondary | ICD-10-CM

## 2015-02-08 DIAGNOSIS — M0520 Rheumatoid vasculitis with rheumatoid arthritis of unspecified site: Secondary | ICD-10-CM

## 2015-02-11 DIAGNOSIS — E039 Hypothyroidism, unspecified: Secondary | ICD-10-CM | POA: Diagnosis not present

## 2015-02-11 DIAGNOSIS — E559 Vitamin D deficiency, unspecified: Secondary | ICD-10-CM | POA: Diagnosis not present

## 2015-02-11 DIAGNOSIS — Z0001 Encounter for general adult medical examination with abnormal findings: Secondary | ICD-10-CM | POA: Diagnosis not present

## 2015-02-11 DIAGNOSIS — R7301 Impaired fasting glucose: Secondary | ICD-10-CM | POA: Diagnosis not present

## 2015-02-12 ENCOUNTER — Inpatient Hospital Stay (HOSPITAL_BASED_OUTPATIENT_CLINIC_OR_DEPARTMENT_OTHER): Payer: Medicare Other | Admitting: Hematology and Oncology

## 2015-02-12 ENCOUNTER — Inpatient Hospital Stay: Payer: Medicare Other

## 2015-02-12 VITALS — BP 126/85 | HR 96 | Temp 96.4°F | Ht 66.0 in | Wt 241.4 lb

## 2015-02-12 DIAGNOSIS — D509 Iron deficiency anemia, unspecified: Secondary | ICD-10-CM | POA: Diagnosis not present

## 2015-02-12 DIAGNOSIS — F319 Bipolar disorder, unspecified: Secondary | ICD-10-CM | POA: Diagnosis not present

## 2015-02-12 DIAGNOSIS — M818 Other osteoporosis without current pathological fracture: Secondary | ICD-10-CM | POA: Diagnosis not present

## 2015-02-12 DIAGNOSIS — K5900 Constipation, unspecified: Secondary | ICD-10-CM

## 2015-02-12 DIAGNOSIS — Z7952 Long term (current) use of systemic steroids: Secondary | ICD-10-CM | POA: Diagnosis not present

## 2015-02-12 DIAGNOSIS — Z79899 Other long term (current) drug therapy: Secondary | ICD-10-CM

## 2015-02-12 DIAGNOSIS — M069 Rheumatoid arthritis, unspecified: Secondary | ICD-10-CM

## 2015-02-12 DIAGNOSIS — Z9884 Bariatric surgery status: Secondary | ICD-10-CM

## 2015-02-12 DIAGNOSIS — E538 Deficiency of other specified B group vitamins: Secondary | ICD-10-CM | POA: Diagnosis not present

## 2015-02-12 DIAGNOSIS — R5383 Other fatigue: Secondary | ICD-10-CM

## 2015-02-12 DIAGNOSIS — E079 Disorder of thyroid, unspecified: Secondary | ICD-10-CM

## 2015-02-12 DIAGNOSIS — F419 Anxiety disorder, unspecified: Secondary | ICD-10-CM | POA: Diagnosis not present

## 2015-02-12 DIAGNOSIS — D649 Anemia, unspecified: Secondary | ICD-10-CM

## 2015-02-12 MED ORDER — CYANOCOBALAMIN 1000 MCG/ML IJ SOLN
1000.0000 ug | Freq: Once | INTRAMUSCULAR | Status: AC
  Administered 2015-02-12: 1000 ug via INTRAMUSCULAR
  Filled 2015-02-12: qty 1

## 2015-02-12 NOTE — Progress Notes (Signed)
Pt here today for follow up regarding anemia and B 12 injection; offers no complaints today

## 2015-02-15 DIAGNOSIS — K760 Fatty (change of) liver, not elsewhere classified: Secondary | ICD-10-CM | POA: Diagnosis not present

## 2015-02-15 DIAGNOSIS — R748 Abnormal levels of other serum enzymes: Secondary | ICD-10-CM | POA: Insufficient documentation

## 2015-02-15 DIAGNOSIS — K740 Hepatic fibrosis: Secondary | ICD-10-CM | POA: Diagnosis not present

## 2015-02-16 DIAGNOSIS — K760 Fatty (change of) liver, not elsewhere classified: Secondary | ICD-10-CM | POA: Insufficient documentation

## 2015-02-16 DIAGNOSIS — K7400 Hepatic fibrosis, unspecified: Secondary | ICD-10-CM | POA: Insufficient documentation

## 2015-02-22 DIAGNOSIS — S79912A Unspecified injury of left hip, initial encounter: Secondary | ICD-10-CM | POA: Diagnosis not present

## 2015-02-22 DIAGNOSIS — Z8781 Personal history of (healed) traumatic fracture: Secondary | ICD-10-CM | POA: Diagnosis not present

## 2015-02-22 DIAGNOSIS — R1030 Lower abdominal pain, unspecified: Secondary | ICD-10-CM | POA: Diagnosis not present

## 2015-02-22 DIAGNOSIS — W19XXXA Unspecified fall, initial encounter: Secondary | ICD-10-CM | POA: Diagnosis not present

## 2015-03-15 ENCOUNTER — Inpatient Hospital Stay: Payer: Medicare Other | Attending: Hematology and Oncology

## 2015-03-15 VITALS — BP 126/85 | HR 103 | Temp 96.2°F

## 2015-03-15 DIAGNOSIS — Z79899 Other long term (current) drug therapy: Secondary | ICD-10-CM | POA: Insufficient documentation

## 2015-03-15 DIAGNOSIS — E538 Deficiency of other specified B group vitamins: Secondary | ICD-10-CM

## 2015-03-15 MED ORDER — CYANOCOBALAMIN 1000 MCG/ML IJ SOLN
1000.0000 ug | Freq: Once | INTRAMUSCULAR | Status: AC
  Administered 2015-03-15: 1000 ug via INTRAMUSCULAR
  Filled 2015-03-15: qty 1

## 2015-03-17 DIAGNOSIS — M0579 Rheumatoid arthritis with rheumatoid factor of multiple sites without organ or systems involvement: Secondary | ICD-10-CM | POA: Diagnosis not present

## 2015-03-17 DIAGNOSIS — E039 Hypothyroidism, unspecified: Secondary | ICD-10-CM | POA: Diagnosis not present

## 2015-03-17 DIAGNOSIS — M810 Age-related osteoporosis without current pathological fracture: Secondary | ICD-10-CM | POA: Diagnosis not present

## 2015-03-17 DIAGNOSIS — M25552 Pain in left hip: Secondary | ICD-10-CM | POA: Diagnosis not present

## 2015-03-17 DIAGNOSIS — F329 Major depressive disorder, single episode, unspecified: Secondary | ICD-10-CM | POA: Diagnosis not present

## 2015-03-22 DIAGNOSIS — M0579 Rheumatoid arthritis with rheumatoid factor of multiple sites without organ or systems involvement: Secondary | ICD-10-CM | POA: Diagnosis not present

## 2015-03-22 DIAGNOSIS — M810 Age-related osteoporosis without current pathological fracture: Secondary | ICD-10-CM | POA: Diagnosis not present

## 2015-03-22 DIAGNOSIS — M79641 Pain in right hand: Secondary | ICD-10-CM | POA: Diagnosis not present

## 2015-03-22 DIAGNOSIS — Z09 Encounter for follow-up examination after completed treatment for conditions other than malignant neoplasm: Secondary | ICD-10-CM | POA: Diagnosis not present

## 2015-03-22 DIAGNOSIS — M7072 Other bursitis of hip, left hip: Secondary | ICD-10-CM | POA: Diagnosis not present

## 2015-04-14 DIAGNOSIS — M810 Age-related osteoporosis without current pathological fracture: Secondary | ICD-10-CM | POA: Diagnosis not present

## 2015-04-14 DIAGNOSIS — E2839 Other primary ovarian failure: Secondary | ICD-10-CM | POA: Diagnosis not present

## 2015-04-14 DIAGNOSIS — M85859 Other specified disorders of bone density and structure, unspecified thigh: Secondary | ICD-10-CM | POA: Diagnosis not present

## 2015-04-14 DIAGNOSIS — M85862 Other specified disorders of bone density and structure, left lower leg: Secondary | ICD-10-CM | POA: Diagnosis not present

## 2015-04-15 ENCOUNTER — Inpatient Hospital Stay: Payer: Medicare Other | Attending: Hematology and Oncology

## 2015-04-15 DIAGNOSIS — E538 Deficiency of other specified B group vitamins: Secondary | ICD-10-CM | POA: Insufficient documentation

## 2015-04-15 DIAGNOSIS — Z79899 Other long term (current) drug therapy: Secondary | ICD-10-CM | POA: Diagnosis not present

## 2015-04-15 MED ORDER — CYANOCOBALAMIN 1000 MCG/ML IJ SOLN
1000.0000 ug | Freq: Once | INTRAMUSCULAR | Status: AC
  Administered 2015-04-15: 1000 ug via INTRAMUSCULAR
  Filled 2015-04-15: qty 1

## 2015-05-09 ENCOUNTER — Emergency Department
Admission: EM | Admit: 2015-05-09 | Discharge: 2015-05-09 | Disposition: A | Discharge status: Home / Self Care | Payer: Medicare Other | Attending: Emergency Medicine | Admitting: Emergency Medicine

## 2015-05-09 DIAGNOSIS — M6281 Muscle weakness (generalized): Secondary | ICD-10-CM | POA: Diagnosis not present

## 2015-05-09 DIAGNOSIS — Z79899 Other long term (current) drug therapy: Secondary | ICD-10-CM | POA: Insufficient documentation

## 2015-05-09 DIAGNOSIS — E860 Dehydration: Secondary | ICD-10-CM | POA: Diagnosis not present

## 2015-05-09 DIAGNOSIS — I10 Essential (primary) hypertension: Secondary | ICD-10-CM | POA: Diagnosis not present

## 2015-05-09 DIAGNOSIS — R531 Weakness: Secondary | ICD-10-CM | POA: Insufficient documentation

## 2015-05-09 LAB — CBC
HCT: 41.9 % (ref 35.0–47.0)
Hemoglobin: 14.1 g/dL (ref 12.0–16.0)
MCH: 32.4 pg (ref 26.0–34.0)
MCHC: 33.5 g/dL (ref 32.0–36.0)
MCV: 96.5 fL (ref 80.0–100.0)
Platelets: 323 10*3/uL (ref 150–440)
RBC: 4.34 MIL/uL (ref 3.80–5.20)
RDW: 13.2 % (ref 11.5–14.5)
WBC: 8.1 10*3/uL (ref 3.6–11.0)

## 2015-05-09 LAB — URINALYSIS COMPLETE WITH MICROSCOPIC (ARMC ONLY)
Bilirubin Urine: NEGATIVE
Glucose, UA: NEGATIVE mg/dL
Nitrite: NEGATIVE
Protein, ur: 30 mg/dL — AB
Specific Gravity, Urine: 1.01 (ref 1.005–1.030)
pH: 6 (ref 5.0–8.0)

## 2015-05-09 LAB — HEPATIC FUNCTION PANEL
ALT: 52 U/L (ref 14–54)
AST: 49 U/L — ABNORMAL HIGH (ref 15–41)
Albumin: 3.5 g/dL (ref 3.5–5.0)
Alkaline Phosphatase: 320 U/L — ABNORMAL HIGH (ref 38–126)
Bilirubin, Direct: <0.1 mg/dL — ABNORMAL LOW (ref 0.1–0.5)
Total Bilirubin: 0.2 mg/dL — ABNORMAL LOW (ref 0.3–1.2)
Total Protein: 7.7 g/dL (ref 6.5–8.1)

## 2015-05-09 LAB — BASIC METABOLIC PANEL
Anion gap: 12 (ref 5–15)
BUN: 35 mg/dL — ABNORMAL HIGH (ref 6–20)
CO2: 18 mmol/L — ABNORMAL LOW (ref 22–32)
Calcium: 10.3 mg/dL (ref 8.9–10.3)
Chloride: 103 mmol/L (ref 101–111)
Creatinine, Ser: 1.09 mg/dL — ABNORMAL HIGH (ref 0.44–1.00)
GFR calc Af Amer: >60 mL/min (ref >60)
GFR calc non Af Amer: 56 mL/min — ABNORMAL LOW (ref >60)
Glucose, Bld: 97 mg/dL (ref 65–99)
Potassium: 3.9 mmol/L (ref 3.5–5.1)
Sodium: 133 mmol/L — ABNORMAL LOW (ref 135–145)

## 2015-05-09 LAB — TROPONIN I: Troponin I: <0.03 ng/mL (ref <0.031)

## 2015-05-09 LAB — CK: Total CK: 370 U/L — ABNORMAL HIGH (ref 38–234)

## 2015-05-09 MED ORDER — HYDROCODONE-ACETAMINOPHEN 5-325 MG PO TABS: 1.0000 | ORAL_TABLET | ORAL | Status: DC | PRN

## 2015-05-09 MED ORDER — SODIUM CHLORIDE 0.9 % IV BOLUS (SEPSIS)
1000.0000 mL | Freq: Once | INTRAVENOUS | Status: AC
  Administered 2015-05-09: 1000 mL via INTRAVENOUS

## 2015-05-09 MED ORDER — MORPHINE SULFATE (PF) 4 MG/ML IV SOLN
4.0000 mg | Freq: Once | INTRAVENOUS | Status: AC
  Administered 2015-05-09: 4 mg via INTRAVENOUS
  Filled 2015-05-09: qty 1

## 2015-05-09 MED ORDER — POTASSIUM CHLORIDE CRYS ER 20 MEQ PO TBCR
40.0000 meq | EXTENDED_RELEASE_TABLET | Freq: Once | ORAL | Status: AC
  Administered 2015-05-09: 40 meq via ORAL
  Filled 2015-05-09: qty 2

## 2015-05-09 MED ORDER — PREDNISONE 20 MG PO TABS: 40.0000 mg | ORAL_TABLET | Freq: Every day | ORAL | Status: DC

## 2015-05-09 MED ORDER — METHYLPREDNISOLONE SODIUM SUCC 125 MG IJ SOLR
125.0000 mg | Freq: Once | INTRAMUSCULAR | Status: AC
Start: 2015-05-09 — End: 2015-05-09
  Administered 2015-05-09: 125 mg via INTRAVENOUS
  Filled 2015-05-09: qty 2

## 2015-05-09 NOTE — Discharge Instructions (Signed)
You have been seen in the emergency department today for generalized weakness. Her labs have shown mild dehydration but are otherwise within normal limits. Please drink plenty of fluids going home and follow up with her primary care physician tomorrow. Return to the emergency department for any personally concerning symptoms.    Weakness Weakness is a lack of strength. You may feel weak all over your body or just in one part of your body. Weakness can be serious. In some cases, you may need more medical tests. HOME Adrian a well-balanced diet.  Try to exercise every day.  Only take medicines as told by your doctor. GET HELP RIGHT AWAY IF:   You cannot do your normal daily activities.  You cannot walk up and down stairs, or you feel very tired when you do so.  You have shortness of breath or chest pain.  You have trouble moving parts of your body.  You have weakness in only one body part or on only one side of the body.  You have a fever.  You have trouble speaking or swallowing.  You cannot control when you pee (urinate) or poop (bowel movement).  You have black or bloody throw up (vomit) or poop.  Your weakness gets worse or spreads to other body parts.  You have new aches or pains. MAKE SURE YOU:   Understand these instructions.  Will watch your condition.  Will get help right away if you are not doing well or get worse.   This information is not intended to replace advice given to you by your health care provider. Make sure you discuss any questions you have with your health care provider.   Document Released: 06/29/2008 Document Revised: 01/16/2012 Document Reviewed: 09/15/2011 Elsevier Interactive Patient Education [employer].

## 2015-05-09 NOTE — ED Provider Notes (Addendum)
[facility] Emergency Department Provider Note  Time seen: 7:27 PM  I have reviewed the triage vital signs and the nursing notes.   HISTORY  Chief Complaint No chief complaint on file.    HPI Jamie Bennett is a 54 y.o. female with a past medical history of arthritis, osteoporosis, hypertension, bipolar, anxiety presents to the emergency department with generalized weakness.According to the patient and her husband for the past one week she has had progressive weakness as well as diffuse muscle/bodyaches. Denies any cough, congestion, fever, nausea, vomiting, diarrhea, dysuria, chest pain, abdominal pain. She has noted occasional palpitations. States she has not had an appetite for the same time period as well. Also notes dark urine and decreased urine production.     Past Medical History  Diagnosis Date  . Osteoarthritis   . Osteoporosis   . Rheumatoid arthritis   . Thyroid disease   . Hypotension   . Constipation   . Anemia   . Bipolar 1 disorder   . Anxiety     Patient Active Problem List   Diagnosis Date Noted  . Anemia 02/05/2015  . Vitamin B 12 deficiency 02/05/2015  . Rheumatoid arteritis 03/02/2014  . Osteoarthritis of left hip 03/02/2014  . Unspecified hypothyroidism 03/02/2014  . Bipolar 1 disorder, depressed ([place]) 02/26/2014  . Bariatric surgery status 11/24/2013  . Affective bipolar disorder ([place]) 11/24/2013  . Polysubstance (excluding opioids) dependence ([place]) 09/11/2013  . Arthritis or polyarthritis, rheumatoid ([place]) 09/05/2013    Past Surgical History  Procedure Laterality Date  . Gastric bypass    . Cholecystectomy      Current Outpatient Rx  Name  Route  Sig  Dispense  Refill  . acetaminophen (TYLENOL) 500 MG tablet   Oral   Take 500 mg by mouth every 6 (six) hours as needed for mild pain.         Marland Kitchen alendronate (FOSAMAX) 70 MG tablet   Oral   Take 1 tablet (70 mg total) by mouth once a week. Take with a full  glass of water on an empty stomach. Patient not taking: Reported on 02/12/2015         . ALPRAZolam (XANAX) 1 MG tablet   Oral   Take 1 mg by mouth.         . benztropine (COGENTIN) 1 MG tablet   Oral   Take 1 mg by mouth 2 (two) times daily. 1 mg in the morning and 2 mg at night         . Cholecalciferol (VITAMIN D3) 400 UNITS CAPS   Oral   Take 400 Units by mouth daily.   30 capsule      . esomeprazole (NEXIUM) 40 MG capsule   Oral   Take 1 capsule (40 mg total) by mouth daily at 12 noon.         . gabapentin (NEURONTIN) 100 MG capsule   Oral   Take 2 capsules (200 mg total) by mouth 2 (two) times daily. Patient not taking: Reported on 02/12/2015   120 capsule   0   . hydroxychloroquine (PLAQUENIL) 200 MG tablet   Oral   Take 200 mg by mouth daily. 200 mg twice a day Mon-Friday only         . lactulose (CEPHULAC) 20 G packet   Oral   Take by mouth.         . levothyroxine (SYNTHROID, LEVOTHROID) 100 MCG tablet   Oral   Take 1  tablet (100 mcg total) by mouth daily before breakfast.         . loratadine (CLARITIN) 10 MG tablet   Oral   Take 10 mg by mouth.         . lurasidone (LATUDA) 80 MG TABS tablet   Oral   Take 120 mg by mouth daily with breakfast.         . modafinil (PROVIGIL) 200 MG tablet   Oral   Take 1-2 tablets (200-400 mg total) by mouth 2 (two) times daily. 400 mg in the am and 200 mg at noon         . Multiple Vitamin (MULTI-VITAMINS) TABS   Oral   Take by mouth.         . Oxycodone HCl 10 MG TABS   Oral   Take 10 mg by mouth.         . psyllium (TGT PSYLLIUM FIBER) 0.52 G capsule   Oral   Take by mouth.         . sucralfate (CARAFATE) 1 G tablet   Oral   Take 1 g by mouth 2 (two) times daily as needed (stomach pain).          . traZODone (DESYREL) 150 MG tablet   Oral   Take 1 tablet (150 mg total) by mouth at bedtime.   14 tablet   0   . traZODone (DESYREL) 50 MG tablet   Oral   Take 50 mg by  mouth.         . ursodiol (ACTIGALL) 300 MG capsule   Oral   Take 1 capsule (300 mg total) by mouth 4 (four) times daily.         Marland Kitchen venlafaxine (EFFEXOR) 100 MG tablet   Oral   Take 100 mg by mouth 2 (two) times daily.         Marland Kitchen venlafaxine XR (EFFEXOR-XR) 150 MG 24 hr capsule   Oral   Take 1 capsule (150 mg total) by mouth daily. Patient not taking: Reported on 02/12/2015   30 capsule   0   . vitamin B-12 (CYANOCOBALAMIN) 1000 MCG tablet   Oral   Take by mouth.           Allergies Review of patient's allergies indicates no known allergies.  Family History  Problem Relation Age of Onset  . Depression Mother   . Dementia Mother     Social History Social History  Substance Use Topics  . Smoking status: Never Smoker   . Smokeless tobacco: Not on file  . Alcohol Use: No    Review of Systems Constitutional: Negative for fever. Positive for generalized weakness. Cardiovascular: Negative for chest pain. Respiratory: Negative for shortness of breath. Gastrointestinal: Negative for abdominal pain, vomiting and diarrhea. Genitourinary: Negative for dysuria. Musculoskeletal: Negative for back pain. Neurological: Negative for headaches, focal weakness or numbness. 10-point ROS otherwise negative.  ____________________________________________   PHYSICAL EXAM:  VITAL SIGNS: ED Triage Vitals  Enc Vitals Group     BP 05/09/15 1906 125/85 mmHg     Pulse Rate 05/09/15 1906 93     Resp 05/09/15 1906 18     Temp 05/09/15 1906 97.5 F (36.4 C)     Temp Source 05/09/15 1906 Oral     SpO2 05/09/15 1906 96 %     Weight 05/09/15 1906 230 lb (104.327 kg)     Height 05/09/15 1906 5\' 6"  (1.676 m)     Head Cir --  Peak Flow --      Pain Score 05/09/15 1909 4     Pain Loc --      Pain Edu? --      Excl. in [place]? --     Constitutional: Alert and oriented. Well appearing and in no distress. Lying in bed comfortably. Eyes: Normal exam ENT   Head: Normocephalic  and atraumatic.   Mouth/Throat: Dry mucous membranes. Cardiovascular: Normal rate, regular rhythm. No murmur Respiratory: Normal respiratory effort without tachypnea nor retractions. Breath sounds are clear and equal bilaterally. No wheezes/rales/rhonchi. Gastrointestinal: Soft and nontender. No distention.   Musculoskeletal: Nontender with normal range of motion in all extremities. No lower extremity edema. Patient does have tenderness in her calves, thighs, biceps, forearms. Neurologic:  Normal speech and language. No gross focal neurologic deficits Skin:  Skin is warm, dry and intact.  Psychiatric: Mood and affect are normal. Speech and behavior are normal.  ____________________________________________    EKG  EKG reviewed and interpreted, such as normal sinus rhythm at 92 bpm, narrow QRS, left axis deviation, normal intervals. Nonspecific ST changes are present. No see elevations noted.  ____________________________________________   INITIAL IMPRESSION / ASSESSMENT AND PLAN / ED COURSE  Pertinent labs & imaging results that were available during my care of the patient were reviewed by me and considered in my medical decision making (see chart for details).  Patient presents with generalized weakness which she states is been progressive over the past one week. Also has diffuse body/muscle aches. We will check labs including CK and urinalysis. We'll monitor closely in the emergency department, IV hydrate and treat discomfort.  Troponin is negative. CK is mildly elevated in the 300s. I discussed with the patient has been the need to drink plenty of fluids, I will prescribe Norco as needed for discomfort, and the patient needs follow-up with her primary care physician tomorrow. We will also prescribe a short course of prednisone for her likely myositis which could be a viral process. They are agreeable to this plan. ____________________________________________   FINAL CLINICAL  IMPRESSION(S) / ED DIAGNOSES  Dehydration Generalized weakness Myositis  Harvest Dark, MD 05/09/15 [id number]  Harvest Dark, MD 05/09/15 2128

## 2015-05-09 NOTE — ED Notes (Signed)
Pt states has felt weakness and "all over body aches in my muscles for about a week." pt denies vomiting, fever, diarrhea. Pt denies shob.

## 2015-05-09 NOTE — ED Notes (Signed)
Pt also complains "i feel like my heart has been beating out of my chest".

## 2015-05-10 DIAGNOSIS — G545 Neuralgic amyotrophy: Secondary | ICD-10-CM | POA: Diagnosis not present

## 2015-05-10 DIAGNOSIS — E039 Hypothyroidism, unspecified: Secondary | ICD-10-CM | POA: Diagnosis not present

## 2015-05-10 DIAGNOSIS — R5383 Other fatigue: Secondary | ICD-10-CM | POA: Diagnosis not present

## 2015-05-10 DIAGNOSIS — M064 Inflammatory polyarthropathy: Secondary | ICD-10-CM | POA: Diagnosis not present

## 2015-05-10 DIAGNOSIS — F329 Major depressive disorder, single episode, unspecified: Secondary | ICD-10-CM | POA: Diagnosis not present

## 2015-05-10 DIAGNOSIS — M0579 Rheumatoid arthritis with rheumatoid factor of multiple sites without organ or systems involvement: Secondary | ICD-10-CM | POA: Diagnosis not present

## 2015-05-13 LAB — URINE CULTURE
Culture: 10000
Special Requests: NORMAL

## 2015-05-14 ENCOUNTER — Inpatient Hospital Stay: Payer: Medicare Other

## 2015-05-14 ENCOUNTER — Inpatient Hospital Stay: Payer: Medicare Other | Attending: Hematology and Oncology | Admitting: Hematology and Oncology

## 2015-05-14 VITALS — BP 112/79 | HR 75 | Temp 95.0°F | Resp 18 | Ht 66.0 in | Wt 236.6 lb

## 2015-05-14 DIAGNOSIS — F419 Anxiety disorder, unspecified: Secondary | ICD-10-CM | POA: Diagnosis not present

## 2015-05-14 DIAGNOSIS — M791 Myalgia: Secondary | ICD-10-CM

## 2015-05-14 DIAGNOSIS — Z79899 Other long term (current) drug therapy: Secondary | ICD-10-CM

## 2015-05-14 DIAGNOSIS — E538 Deficiency of other specified B group vitamins: Secondary | ICD-10-CM

## 2015-05-14 DIAGNOSIS — F319 Bipolar disorder, unspecified: Secondary | ICD-10-CM

## 2015-05-14 DIAGNOSIS — Z9884 Bariatric surgery status: Secondary | ICD-10-CM | POA: Diagnosis not present

## 2015-05-14 DIAGNOSIS — E079 Disorder of thyroid, unspecified: Secondary | ICD-10-CM | POA: Diagnosis not present

## 2015-05-14 DIAGNOSIS — D649 Anemia, unspecified: Secondary | ICD-10-CM

## 2015-05-14 DIAGNOSIS — R5383 Other fatigue: Secondary | ICD-10-CM

## 2015-05-14 DIAGNOSIS — D509 Iron deficiency anemia, unspecified: Secondary | ICD-10-CM

## 2015-05-14 DIAGNOSIS — M818 Other osteoporosis without current pathological fracture: Secondary | ICD-10-CM | POA: Diagnosis not present

## 2015-05-14 DIAGNOSIS — K5900 Constipation, unspecified: Secondary | ICD-10-CM | POA: Diagnosis not present

## 2015-05-14 DIAGNOSIS — M069 Rheumatoid arthritis, unspecified: Secondary | ICD-10-CM

## 2015-05-14 LAB — CBC WITH DIFFERENTIAL/PLATELET
Basophils Absolute: 0 10*3/uL (ref 0–0.1)
Basophils Relative: 0 %
Eosinophils Absolute: 0.1 10*3/uL (ref 0–0.7)
Eosinophils Relative: 1 %
HCT: 36.8 % (ref 35.0–47.0)
Hemoglobin: 12.3 g/dL (ref 12.0–16.0)
Lymphocytes Relative: 16 %
Lymphs Abs: 1.7 10*3/uL (ref 1.0–3.6)
MCH: 31.9 pg (ref 26.0–34.0)
MCHC: 33.5 g/dL (ref 32.0–36.0)
MCV: 95.2 fL (ref 80.0–100.0)
Monocytes Absolute: 0.9 10*3/uL (ref 0.2–0.9)
Monocytes Relative: 8 %
Neutro Abs: 8 10*3/uL — ABNORMAL HIGH (ref 1.4–6.5)
Neutrophils Relative %: 75 %
Platelets: 465 10*3/uL — ABNORMAL HIGH (ref 150–440)
RBC: 3.86 MIL/uL (ref 3.80–5.20)
RDW: 13.3 % (ref 11.5–14.5)
WBC: 10.7 10*3/uL (ref 3.6–11.0)

## 2015-05-14 LAB — IRON AND TIBC
Iron: 87 ug/dL (ref 28–170)
Saturation Ratios: 24 % (ref 10.4–31.8)
TIBC: 368 ug/dL (ref 250–450)
UIBC: 282 ug/dL

## 2015-05-14 LAB — FOLATE: Folate: 19.5 ng/mL (ref >5.9)

## 2015-05-14 LAB — FERRITIN: Ferritin: 160 ng/mL (ref 11–307)

## 2015-05-14 MED ORDER — CYANOCOBALAMIN 1000 MCG/ML IJ SOLN
1000.0000 ug | Freq: Once | INTRAMUSCULAR | Status: AC
  Administered 2015-05-14: 1000 ug via INTRAMUSCULAR
  Filled 2015-05-14: qty 1

## 2015-05-14 NOTE — Progress Notes (Signed)
Patient is here for follow-up of anemia. Patient states that she went to the ER on Sunday due to pain in all her muscles and extreme weakness. Patient states that they could not find anything specific wrong with her. She then followed-up with her PCP, Dr. Humphrey Rolls, on Monday and was told it was her rheumatoid arthritis because her sed rate was very high. They placed her on a prednisone taper which has helped with her muscle pain. Her appetite has also been very poor ever since that weakness and pain started.

## 2015-05-14 NOTE — Progress Notes (Signed)
[facility] day:  02/12/2015  Chief Complaint: Jamie Bennett is a 54 y.o. female with a history anemia, leukopenia, and B12 deficiency who is seen for reassessment.  HPI: The patient has a history of gastric bypass surgery in 2003.  She has had documented B12 deficiency for at least 2 years.  She has received supplimentation. She last received B12 on 01/14/2015.  She also has a has a history of rheumatoid arthritis for which she received Humira in the past as well as methotrexate.  Humira caused leukopenia. Methotrexate caused liver toxicity.  She describes her bile ducts being inflamed. She underwent ERCP at [facility]. A stent was placed. Liver enzymes within the 600s. She states that she is on Plaquenil now.    She has a history of iron deficiency anemia. She had a colonoscopy about 7 years ago.  Diet is good. She eats rich foods. She states that she took an iron supplement but it was not absorbed.  She really is at the end of 02/2014 she had an iron infusion.  Symptomatically, the patient notes fatigue. She notes that her bowels are okay. Occasionally she has body aches. She states that stress causes her to tighten up makes her jaw ache. She has a ongoing issues with rheumatoid arthritis including trouble with her hands, knees, and hips. She needs one hour to work with her hands in the morning.  Past Medical History  Diagnosis Date  . Osteoarthritis   . Osteoporosis   . Rheumatoid arthritis   . Thyroid disease   . Hypotension   . Constipation   . Anemia   . Bipolar 1 disorder   . Anxiety     Past Surgical History  Procedure Laterality Date  . Gastric bypass    . Cholecystectomy      Family History  Problem Relation Age of Onset  . Depression Mother   . Dementia Mother     Social History:  reports that she has never smoked. She does not have any smokeless tobacco history on file. She reports that she does not drink alcohol or use  illicit drugs.  She states that she lost her job and 02/2014. She's been living off her savings. The patient is alone today.  Allergies: No Known Allergies  Current Medications: Current Outpatient Prescriptions  Medication Sig Dispense Refill  . acetaminophen (TYLENOL) 500 MG tablet Take 500 mg by mouth every 6 (six) hours as needed for mild pain.    Marland Kitchen ALPRAZolam (XANAX) 1 MG tablet Take 1 mg by mouth.    . benztropine (COGENTIN) 1 MG tablet Take 1 mg by mouth 2 (two) times daily. 1 mg in the morning and 2 mg at night    . Cholecalciferol (VITAMIN D3) 400 UNITS CAPS Take 400 Units by mouth daily. 30 capsule   . esomeprazole (NEXIUM) 40 MG capsule Take 1 capsule (40 mg total) by mouth daily at 12 noon.    . hydroxychloroquine (PLAQUENIL) 200 MG tablet Take 200 mg by mouth daily. 200 mg twice a day Mon-Friday only    . lactulose (CEPHULAC) 20 G packet Take by mouth.    . levothyroxine (SYNTHROID, LEVOTHROID) 100 MCG tablet Take 1 tablet (100 mcg total) by mouth daily before breakfast.    . loratadine (CLARITIN) 10 MG tablet Take 10 mg by mouth.    . lurasidone (LATUDA) 80 MG TABS tablet Take 120 mg by mouth daily with breakfast.    . modafinil (  PROVIGIL) 200 MG tablet Take 1-2 tablets (200-400 mg total) by mouth 2 (two) times daily. 400 mg in the am and 200 mg at noon    . Multiple Vitamin (MULTI-VITAMINS) TABS Take by mouth.    . Oxycodone HCl 10 MG TABS Take 10 mg by mouth.    . psyllium (TGT PSYLLIUM FIBER) 0.52 G capsule Take by mouth.    . traZODone (DESYREL) 150 MG tablet Take 1 tablet (150 mg total) by mouth at bedtime. 14 tablet 0  . traZODone (DESYREL) 50 MG tablet Take 50 mg by mouth.    . ursodiol (ACTIGALL) 300 MG capsule Take 1 capsule (300 mg total) by mouth 4 (four) times daily.    Marland Kitchen venlafaxine (EFFEXOR) 100 MG tablet Take 100 mg by mouth 2 (two) times daily.    . vitamin B-12 (CYANOCOBALAMIN) 1000 MCG tablet Take by mouth.    Marland Kitchen alendronate (FOSAMAX) 70 MG tablet Take 1 tablet  (70 mg total) by mouth once a week. Take with a full glass of water on an empty stomach.    . gabapentin (NEURONTIN) 100 MG capsule Take 2 capsules (200 mg total) by mouth 2 (two) times daily. 120 capsule 0  . HYDROcodone-acetaminophen (NORCO/VICODIN) 5-325 MG tablet Take 1 tablet by mouth every 4 (four) hours as needed for moderate pain. 12 tablet 0  . predniSONE (DELTASONE) 20 MG tablet Take 2 tablets (40 mg total) by mouth daily. (Patient not taking: Reported on 05/14/2015) 10 tablet 0  . predniSONE (STERAPRED UNI-PAK 21 TAB) 10 MG (21) TBPK tablet     . sucralfate (CARAFATE) 1 G tablet Take 1 g by mouth 2 (two) times daily as needed (stomach pain).     Marland Kitchen venlafaxine XR (EFFEXOR-XR) 150 MG 24 hr capsule Take 1 capsule (150 mg total) by mouth daily. (Patient not taking: Reported on 05/14/2015) 30 capsule 0  . zoledronic acid (RECLAST) 5 MG/100ML SOLN injection Inject into the vein.     No current facility-administered medications for this visit.    Review of Systems:  GENERAL:  Fatigue.  No fevers, sweats or weight loss. PERFORMANCE STATUS (ECOG): 1-2 HEENT:  No visual changes, runny nose, sore throat, mouth sores or tenderness. Lungs: No shortness of breath or cough.  No hemoptysis. Cardiac:  No chest pain, palpitations, orthopnea, or PND. GI:  No nausea, vomiting, diarrhea, constipation, melena or hematochezia. GU:  No urgency, frequency, dysuria, or hematuria. Musculoskeletal:  Joints issues (hands, knees, hips).  Occasional body aches.  No back pain.  No muscle tenderness. Extremities:  No pain or swelling. Skin:  No rashes or skin changes. Neuro:  No headache, numbness or weakness, balance or coordination issues. Endocrine:  No diabetes, thyroid issues, hot flashes or night sweats. Psych:  Depression.  Anxiety. Pain:  No focal pain. Review of systems:  All other systems reviewed and found to be negative.  Physical Exam: Blood pressure 126/85, pulse 96, temperature 96.4 F (35.8  C), temperature source Tympanic, height '5\' 6"'  (1.676 m), weight 241 lb 6.5 oz (109.5 kg). GENERAL:  Well developed, well nourished, sitting comfortably in the exam room in no acute distress. MENTAL STATUS:  Alert and oriented to person, place and time. HEAD:  Short brown hair.  Normocephalic, atraumatic, face symmetric, no Cushingoid features. EYES:  Glasses.  Brown eyes.  Pupils equal round and reactive to light and accomodation.  No conjunctivitis or scleral icterus. ENT:  Oropharynx clear without lesion.  Tongue normal. Mucous membranes moist.  RESPIRATORY:  Clear to auscultation without rales, wheezes or rhonchi. CARDIOVASCULAR:  Regular rate and rhythm without murmur, rub or gallop. ABDOMEN:  Soft, non-tender, with active bowel sounds, and no hepatosplenomegaly.  No masses. SKIN:  No rashes, ulcers or lesions. EXTREMITIES: No edema, no skin discoloration or tenderness.  No palpable cords. LYMPH NODES: No palpable cervical, supraclavicular, axillary or inguinal adenopathy  NEUROLOGICAL: Unremarkable. PSYCH:  Appropriate.  No visits with results within 3 Day(s) from this visit. Latest known visit with results is:  Appointment on 02/05/2015  Component Date Value Ref Range Status  . WBC 02/05/2015 3.3* 3.6 - 11.0 K/uL Final  . RBC 02/05/2015 3.97  3.80 - 5.20 MIL/uL Final  . Hemoglobin 02/05/2015 12.7  12.0 - 16.0 g/dL Final  . HCT 02/05/2015 38.8  35.0 - 47.0 % Final  . MCV 02/05/2015 97.7  80.0 - 100.0 fL Final  . MCH 02/05/2015 31.9  26.0 - 34.0 pg Final  . MCHC 02/05/2015 32.6  32.0 - 36.0 g/dL Final  . RDW 02/05/2015 12.9  11.5 - 14.5 % Final  . Platelets 02/05/2015 212  150 - 440 K/uL Final  . Sodium 02/05/2015 136  135 - 145 mmol/L Final  . Potassium 02/05/2015 3.8  3.5 - 5.1 mmol/L Final  . Chloride 02/05/2015 109  101 - 111 mmol/L Final  . CO2 02/05/2015 22  22 - 32 mmol/L Final  . Glucose, Bld 02/05/2015 183* 65 - 99 mg/dL Final  . BUN 02/05/2015 11  6 - 20 mg/dL Final   . Creatinine, Ser 02/05/2015 0.85  0.44 - 1.00 mg/dL Final  . Calcium 02/05/2015 8.6* 8.9 - 10.3 mg/dL Final  . Total Protein 02/05/2015 6.9  6.5 - 8.1 g/dL Final  . Albumin 02/05/2015 3.7  3.5 - 5.0 g/dL Final  . AST 02/05/2015 64* 15 - 41 U/L Final  . ALT 02/05/2015 115* 14 - 54 U/L Final  . Alkaline Phosphatase 02/05/2015 150* 38 - 126 U/L Final  . Total Bilirubin 02/05/2015 0.4  0.3 - 1.2 mg/dL Final  . GFR calc non Af Amer 02/05/2015 >60  >60 mL/min Final  . GFR calc Af Amer 02/05/2015 >60  >60 mL/min Final   Comment: (NOTE) The eGFR has been calculated using the CKD EPI equation. This calculation has not been validated in all clinical situations. eGFR's persistently <60 mL/min signify possible Chronic Kidney Disease.   . Anion gap 02/05/2015 5  5 - 15 Final  . Ferritin 02/05/2015 38  11 - 307 ng/mL Final  . Iron 02/05/2015 86  28 - 170 ug/dL Final  . TIBC 02/05/2015 444  250 - 450 ug/dL Final  . Saturation Ratios 02/05/2015 19  10.4 - 31.8 % Final  . UIBC 02/05/2015 358   Final  . Vitamin B-12 02/05/2015 619  180 - 914 pg/mL Final   Comment: (NOTE) This assay is not validated for testing neonatal or myeloproliferative syndrome specimens for Vitamin B12 levels. Performed at [facility]     Assessment:  Jamie Bennett is a 54 y.o. female with a history of gastric bypass surgery in 2003.  She has B12 deficiency. She has a history of iron deficiency anemia. Last colonoscopy was 7 years ago.  Diet is good. She eats rich foods. She  Is unable to absorb oral iron.  She received IV in 02/2014.  Ferritin was 38 and B12 619 on 02/05/2015.  Iron saturation was 19%.  She also has a has a history of rheumatoid arthritis for which  she received Humira in the past as well as methotrexate.  Humira caused leukopenia. Methotrexate caused liver toxicity.  She is currently on Plaquenil.    Symptomatically, she is fatigued. Exam is unremarkable.  Plan: 1. Review entire medical  history, diagnosis and management of B12 deficiency and iron deficiency anemia. 2. Review recent labs from 02/05/2015 and 02/08/2015. 3. B12 today. 4. RTC monthly for B12. 5. RTC in 3 months for MD assessment, labs (CBC with diff, ferritin, iron studies, folate), and B12.   Lequita Asal, MD  02/12/2015

## 2015-05-14 NOTE — Progress Notes (Signed)
[facility] day:  05/14/2015  Chief Complaint: Jamie Bennett is a 54 y.o. female status post gastric bypass surgery with iron deficiency anemia, B12 deficiency, and leukopenia who is seen for 3 month assessment.  HPI: The patient was last seen in the medical oncology clinic on 02/12/2015.  At that time, she was seen for initial assessment by me.  Symptomatically she noted fatigue. She had ongoing issues with her rheumatoid arthritis. She was on Plaquenil.  Labs from 02/05/2015 had included a hematocrit of 38.8, hemoglobin 12.7, MCV 97.7, platelets 212,000 and white count 3300.  Ferritin was 38 and iron saturation of 19%. She received B12.  She has continued to receive B12 monthly.  She notes that she was seen in the emergency room on Sunday secondary to extreme weakness with muscle pain everywhere.  She states that she was seen by Dr. Humphrey Rolls who noted a elevated sedimentation rate and inflammation due to her rheumatoid arthritis. She is on a prednisone pulse.  Her last pills are tomorrow. He notes that the prednisone has helped her symptoms. Her appetite remains poor.  The patient notes that GI she took her off her ursodiol. Her liver enzymes were high. She notes that a stent is in her bile duct.  She was "on methotrexate too long".  Her ammonia level is fine.  Past Medical History  Diagnosis Date  . Osteoarthritis   . Osteoporosis   . Rheumatoid arthritis   . Thyroid disease   . Hypotension   . Constipation   . Anemia   . Bipolar 1 disorder   . Anxiety     Past Surgical History  Procedure Laterality Date  . Gastric bypass    . Cholecystectomy      Family History  Problem Relation Age of Onset  . Depression Mother   . Dementia Mother     Social History:  reports that she has never smoked. She does not have any smokeless tobacco history on file. She reports that she does not drink alcohol or use illicit drugs.  The patient is  accompanied by her husband today.  Allergies: No Known Allergies  Current Medications: Current Outpatient Prescriptions  Medication Sig Dispense Refill  . acetaminophen (TYLENOL) 500 MG tablet Take 500 mg by mouth every 6 (six) hours as needed for mild pain.    Marland Kitchen alendronate (FOSAMAX) 70 MG tablet Take 1 tablet (70 mg total) by mouth once a week. Take with a full glass of water on an empty stomach.    . ALPRAZolam (XANAX) 1 MG tablet Take 1 mg by mouth.    . benztropine (COGENTIN) 1 MG tablet Take 1 mg by mouth 2 (two) times daily. 1 mg in the morning and 2 mg at night    . Cholecalciferol (VITAMIN D3) 400 UNITS CAPS Take 400 Units by mouth daily. 30 capsule   . esomeprazole (NEXIUM) 40 MG capsule Take 1 capsule (40 mg total) by mouth daily at 12 noon.    . gabapentin (NEURONTIN) 100 MG capsule Take 2 capsules (200 mg total) by mouth 2 (two) times daily. 120 capsule 0  . HYDROcodone-acetaminophen (NORCO/VICODIN) 5-325 MG tablet Take 1 tablet by mouth every 4 (four) hours as needed for moderate pain. 12 tablet 0  . hydroxychloroquine (PLAQUENIL) 200 MG tablet Take 200 mg by mouth daily. 200 mg twice a day Mon-Friday only    . lactulose (CEPHULAC) 20 G packet Take by mouth.    Marland Kitchen  levothyroxine (SYNTHROID, LEVOTHROID) 100 MCG tablet Take 1 tablet (100 mcg total) by mouth daily before breakfast.    . loratadine (CLARITIN) 10 MG tablet Take 10 mg by mouth.    . modafinil (PROVIGIL) 200 MG tablet Take 1-2 tablets (200-400 mg total) by mouth 2 (two) times daily. 400 mg in the am and 200 mg at noon    . Multiple Vitamin (MULTI-VITAMINS) TABS Take by mouth.    . Oxycodone HCl 10 MG TABS Take 10 mg by mouth.    . predniSONE (STERAPRED UNI-PAK 21 TAB) 10 MG (21) TBPK tablet     . psyllium (TGT PSYLLIUM FIBER) 0.52 G capsule Take by mouth.    . sucralfate (CARAFATE) 1 G tablet Take 1 g by mouth 2 (two) times daily as needed (stomach pain).     . traZODone (DESYREL) 150 MG tablet Take 1 tablet (150 mg  total) by mouth at bedtime. 14 tablet 0  . traZODone (DESYREL) 50 MG tablet Take 50 mg by mouth.    . ursodiol (ACTIGALL) 300 MG capsule Take 1 capsule (300 mg total) by mouth 4 (four) times daily.    Marland Kitchen venlafaxine (EFFEXOR) 100 MG tablet Take 100 mg by mouth 2 (two) times daily.    . vitamin B-12 (CYANOCOBALAMIN) 1000 MCG tablet Take by mouth.    . lurasidone (LATUDA) 80 MG TABS tablet Take 120 mg by mouth daily with breakfast.    . predniSONE (DELTASONE) 20 MG tablet Take 2 tablets (40 mg total) by mouth daily. (Patient not taking: Reported on 05/14/2015) 10 tablet 0  . venlafaxine XR (EFFEXOR-XR) 150 MG 24 hr capsule Take 1 capsule (150 mg total) by mouth daily. (Patient not taking: Reported on 05/14/2015) 30 capsule 0  . zoledronic acid (RECLAST) 5 MG/100ML SOLN injection Inject into the vein.     No current facility-administered medications for this visit.   Facility-Administered Medications Ordered in Other Visits  Medication Dose Route Frequency Provider Last Rate Last Dose  . cyanocobalamin ((VITAMIN B-12)) injection 1,000 mcg  1,000 mcg Intramuscular Once Lequita Asal, MD        Review of Systems:  GENERAL:  Fatigue.  No fevers, sweats or weight loss. PERFORMANCE STATUS (ECOG):  1 HEENT:  No visual changes, runny nose, sore throat, mouth sores or tenderness. Lungs: No shortness of breath or cough.  No hemoptysis. Cardiac:  No chest pain, palpitations, orthopnea, or PND. GI:  No nausea, vomiting, diarrhea, constipation, melena or hematochezia. GU:  No urgency, frequency, dysuria, or hematuria. Musculoskeletal:  Muscles aching pushing around a wheelchair.  Joint pain. Extremities:  No pain or swelling. Skin:  No rashes or skin changes. Neuro:  No headache, numbness or weakness, balance or coordination issues. Endocrine:  No diabetes, thyroid issues, hot flashes or night sweats. Psych:  No mood changes, depression or anxiety. Pain:  Joint pain. Review of systems:  All  other systems reviewed and found to be negative.  Physical Exam: Blood pressure 112/79, pulse 75, temperature 95 F (35 C), temperature source Tympanic, resp. rate 18, height _0  (1.676 m), weight 236 lb 8.9 oz (107.3 kg). GENERAL:  Well developed, well nourished, sitting comfortably in the exam room in no acute distress.  Soft spoken. MENTAL STATUS:  Alert and oriented to person, place and time. HEAD:  Long brown hair.  Normocephalic, atraumatic, face symmetric, no Cushingoid features. EYES:  Gold rimmed glasses.  Brown eyes.  Pupils equal round and reactive to light and accomodation.  No  conjunctivitis or scleral icterus. ENT:  Oropharynx clear without lesion.  Tongue normal. Mucous membranes moist.  RESPIRATORY:  Clear to auscultation without rales, wheezes or rhonchi. CARDIOVASCULAR:  Regular rate and rhythm without murmur, rub or gallop. ABDOMEN:  Fully round.  Soft, non-tender, with active bowel sounds, and no hepatosplenomegaly.  No masses. SKIN:  Antecubital bruises (left > right).  No rashes, ulcers or lesions. EXTREMITIES: No edema, no skin discoloration or tenderness.  No palpable cords. LYMPH NODES: No palpable cervical, supraclavicular, axillary or inguinal adenopathy  NEUROLOGICAL: Unremarkable. PSYCH:  Appropriate.  Appointment on 05/14/2015  Component Date Value Ref Range Status  . WBC 05/14/2015 10.7  3.6 - 11.0 K/uL Final  . RBC 05/14/2015 3.86  3.80 - 5.20 MIL/uL Final  . Hemoglobin 05/14/2015 12.3  12.0 - 16.0 g/dL Final  . HCT 05/14/2015 36.8  35.0 - 47.0 % Final  . MCV 05/14/2015 95.2  80.0 - 100.0 fL Final  . MCH 05/14/2015 31.9  26.0 - 34.0 pg Final  . MCHC 05/14/2015 33.5  32.0 - 36.0 g/dL Final  . RDW 05/14/2015 13.3  11.5 - 14.5 % Final  . Platelets 05/14/2015 465* 150 - 440 K/uL Final  . Neutrophils Relative % 05/14/2015 75   Final  . Neutro Abs 05/14/2015 8.0* 1.4 - 6.5 K/uL Final  . Lymphocytes Relative 05/14/2015 16   Final  . Lymphs Abs 05/14/2015  1.7  1.0 - 3.6 K/uL Final  . Monocytes Relative 05/14/2015 8   Final  . Monocytes Absolute 05/14/2015 0.9  0.2 - 0.9 K/uL Final  . Eosinophils Relative 05/14/2015 1   Final  . Eosinophils Absolute 05/14/2015 0.1  0 - 0.7 K/uL Final  . Basophils Relative 05/14/2015 0   Final  . Basophils Absolute 05/14/2015 0.0  0 - 0.1 K/uL Final  . Ferritin 05/14/2015 160  11 - 307 ng/mL Final  . Iron 05/14/2015 87  28 - 170 ug/dL Final  . TIBC 05/14/2015 368  250 - 450 ug/dL Final  . Saturation Ratios 05/14/2015 24  10.4 - 31.8 % Final  . UIBC 05/14/2015 282   Final  . Folate 05/14/2015 19.5  >5.9 ng/mL Final    Assessment:  Jamie Bennett is a 54 y.o. female female with a history of gastric bypass surgery in 2003. She has B12 deficiency. She has a history of iron deficiency anemia. Last colonoscopy was 7 years ago. Diet is good. She eats rich foods. She Is unable to absorb oral iron. She received IV in 02/2014. Ferritin was 38 and B12 619 on 02/05/2015. Iron saturation was 19%.  She also has a has a history of rheumatoid arthritis for which she received Humira in the past as well as methotrexate. Humira caused leukopenia. Methotrexate caused liver toxicity. She is currently on Plaquenil. She is on a prednisone pulse.  Symptomatically, she is fatigued. Muscles ache.  Exam is unremarkable.  Plan: 1. Labs today:  CBC with diff, ferritin, iron studies, folate. 2. B12 today. 3. RTC monthly for B12. 4. RTC in 4 months for MD assessment, labs (CBC with diff, ferritin, iron studies, ESR), and B12.   Lequita Asal, MD  05/14/2015, 3:45 PM

## 2015-05-27 DIAGNOSIS — R319 Hematuria, unspecified: Secondary | ICD-10-CM | POA: Diagnosis not present

## 2015-05-27 DIAGNOSIS — N390 Urinary tract infection, site not specified: Secondary | ICD-10-CM | POA: Diagnosis not present

## 2015-05-27 DIAGNOSIS — M545 Low back pain: Secondary | ICD-10-CM | POA: Diagnosis not present

## 2015-05-27 DIAGNOSIS — M064 Inflammatory polyarthropathy: Secondary | ICD-10-CM | POA: Diagnosis not present

## 2015-05-27 DIAGNOSIS — F329 Major depressive disorder, single episode, unspecified: Secondary | ICD-10-CM | POA: Diagnosis not present

## 2015-05-27 DIAGNOSIS — E039 Hypothyroidism, unspecified: Secondary | ICD-10-CM | POA: Diagnosis not present

## 2015-05-31 ENCOUNTER — Other Ambulatory Visit: Payer: Self-pay | Admitting: Nurse Practitioner

## 2015-05-31 DIAGNOSIS — Z79899 Other long term (current) drug therapy: Secondary | ICD-10-CM | POA: Diagnosis not present

## 2015-05-31 DIAGNOSIS — R319 Hematuria, unspecified: Secondary | ICD-10-CM

## 2015-06-03 ENCOUNTER — Ambulatory Visit
Admission: RE | Admit: 2015-06-03 | Discharge: 2015-06-03 | Disposition: A | Discharge status: Home / Self Care | Payer: Medicare Other | Source: Ambulatory Visit | Attending: Nurse Practitioner | Admitting: Nurse Practitioner

## 2015-06-03 DIAGNOSIS — R319 Hematuria, unspecified: Secondary | ICD-10-CM | POA: Insufficient documentation

## 2015-06-03 DIAGNOSIS — N200 Calculus of kidney: Secondary | ICD-10-CM | POA: Diagnosis not present

## 2015-06-04 ENCOUNTER — Other Ambulatory Visit: Payer: Self-pay | Admitting: Nurse Practitioner

## 2015-06-04 DIAGNOSIS — R319 Hematuria, unspecified: Secondary | ICD-10-CM

## 2015-06-10 ENCOUNTER — Ambulatory Visit: Admission: RE | Admit: 2015-06-10 | Payer: Managed Care, Other (non HMO) | Source: Ambulatory Visit

## 2015-06-14 ENCOUNTER — Inpatient Hospital Stay: Payer: Managed Care, Other (non HMO)

## 2015-06-14 DIAGNOSIS — N390 Urinary tract infection, site not specified: Secondary | ICD-10-CM | POA: Diagnosis not present

## 2015-06-14 DIAGNOSIS — R319 Hematuria, unspecified: Secondary | ICD-10-CM | POA: Diagnosis not present

## 2015-06-14 DIAGNOSIS — M545 Low back pain: Secondary | ICD-10-CM | POA: Diagnosis not present

## 2015-06-14 DIAGNOSIS — F329 Major depressive disorder, single episode, unspecified: Secondary | ICD-10-CM | POA: Diagnosis not present

## 2015-06-15 ENCOUNTER — Encounter: Payer: Self-pay | Admitting: Hematology and Oncology

## 2015-06-15 ENCOUNTER — Other Ambulatory Visit: Payer: Self-pay | Admitting: Hematology and Oncology

## 2015-06-15 ENCOUNTER — Inpatient Hospital Stay: Payer: Medicare Other | Attending: Hematology and Oncology

## 2015-06-15 DIAGNOSIS — Z79899 Other long term (current) drug therapy: Secondary | ICD-10-CM | POA: Insufficient documentation

## 2015-06-15 DIAGNOSIS — E538 Deficiency of other specified B group vitamins: Secondary | ICD-10-CM | POA: Insufficient documentation

## 2015-06-15 MED ORDER — CYANOCOBALAMIN 1000 MCG/ML IJ SOLN
1000.0000 ug | Freq: Once | INTRAMUSCULAR | Status: AC
  Administered 2015-06-15: 1000 ug via INTRAMUSCULAR
  Filled 2015-06-15: qty 1

## 2015-06-28 DIAGNOSIS — M810 Age-related osteoporosis without current pathological fracture: Secondary | ICD-10-CM | POA: Diagnosis not present

## 2015-07-01 DIAGNOSIS — R7989 Other specified abnormal findings of blood chemistry: Secondary | ICD-10-CM | POA: Diagnosis not present

## 2015-07-01 DIAGNOSIS — K760 Fatty (change of) liver, not elsewhere classified: Secondary | ICD-10-CM | POA: Diagnosis not present

## 2015-07-02 ENCOUNTER — Ambulatory Visit (INDEPENDENT_AMBULATORY_CARE_PROVIDER_SITE_OTHER): Payer: Medicare Other | Admitting: Licensed Clinical Social Worker

## 2015-07-02 DIAGNOSIS — F332 Major depressive disorder, recurrent severe without psychotic features: Secondary | ICD-10-CM

## 2015-07-02 DIAGNOSIS — F411 Generalized anxiety disorder: Secondary | ICD-10-CM | POA: Diagnosis not present
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
## 2015-07-05 DIAGNOSIS — M064 Inflammatory polyarthropathy: Secondary | ICD-10-CM | POA: Diagnosis not present

## 2015-07-05 DIAGNOSIS — R319 Hematuria, unspecified: Secondary | ICD-10-CM | POA: Diagnosis not present

## 2015-07-05 DIAGNOSIS — M545 Low back pain: Secondary | ICD-10-CM | POA: Diagnosis not present

## 2015-07-05 DIAGNOSIS — F329 Major depressive disorder, single episode, unspecified: Secondary | ICD-10-CM | POA: Diagnosis not present

## 2015-07-05 DIAGNOSIS — N390 Urinary tract infection, site not specified: Secondary | ICD-10-CM | POA: Diagnosis not present

## 2015-07-06 DIAGNOSIS — R933 Abnormal findings on diagnostic imaging of other parts of digestive tract: Secondary | ICD-10-CM | POA: Diagnosis not present

## 2015-07-06 DIAGNOSIS — R7989 Other specified abnormal findings of blood chemistry: Secondary | ICD-10-CM | POA: Diagnosis not present

## 2015-07-06 DIAGNOSIS — R945 Abnormal results of liver function studies: Secondary | ICD-10-CM | POA: Diagnosis not present

## 2015-07-09 ENCOUNTER — Ambulatory Visit (INDEPENDENT_AMBULATORY_CARE_PROVIDER_SITE_OTHER): Payer: Medicare Other | Admitting: Psychiatry

## 2015-07-09 ENCOUNTER — Encounter: Payer: Self-pay | Admitting: Psychiatry

## 2015-07-09 VITALS — BP 123/88 | HR 65 | Resp 12 | Ht 66.0 in

## 2015-07-09 DIAGNOSIS — F332 Major depressive disorder, recurrent severe without psychotic features: Secondary | ICD-10-CM

## 2015-07-09 DIAGNOSIS — F3170 Bipolar disorder, currently in remission, most recent episode unspecified: Secondary | ICD-10-CM | POA: Diagnosis not present

## 2015-07-09 DIAGNOSIS — F411 Generalized anxiety disorder: Secondary | ICD-10-CM

## 2015-07-09 MED ORDER — BUSPIRONE HCL 10 MG PO TABS: ORAL_TABLET | ORAL | Status: DC

## 2015-07-09 NOTE — Progress Notes (Signed)
Psychiatric Initial Adult Assessment   Patient Identification: Jamie Bennett MRN:  [id number] Date of Evaluation:  07/09/2015 Referral Source: self/counselor Chief Complaint:   "technically what brings me in is I have to change psychiatrists." Patient catered her last psychiatrist is not going to take Medicare. Visit Diagnosis:    ICD-9-CM ICD-10-CM   1. GAD (generalized anxiety disorder) 300.02 F41.1   2. Major depressive disorder, recurrent, severe without psychotic features ([place]) 296.33 F33.2   3. Bipolar disorder in full remission, most recent episode unspecified type ([place]) 296.80 F31.70    Diagnosis:   Patient Active Problem List   Diagnosis Date Noted  . Anemia [D64.9] 02/05/2015  . Vitamin B 12 deficiency [E53.8] 02/05/2015  . Rheumatoid arteritis [I00] 03/02/2014  . Osteoarthritis of left hip [M16.12] 03/02/2014  . Unspecified hypothyroidism [E03.9] 03/02/2014  . Bipolar 1 disorder, depressed ([place]) [F31.9] 02/26/2014  . Bariatric surgery status [Z98.84] 11/24/2013  . Affective bipolar disorder ([place]) [F31.9] 11/24/2013  . Polysubstance (excluding opioids) dependence ([place]) [F19.20] 09/11/2013  . 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 She states that she was doing well in that field and there was a sudden change in the curriculum and she ended up having significant anxiety and behavioral problems. She states she overdosed on muscle relaxants. She then states later that year she cut her wrists and November 2003. She endorses depressive episodes which consist of sleep disturbance. She states initial taker a long time to go to sleep and then she'll sleep 4 hours. However husband who is present for the entire appointment states then she'll eventually get back into bed and sleep throughout the day. She has low energy increased appetite  social withdrawal. She states these periods have lasted 2 weeks.  Patient disputes whether she actually has bipolar disorder however she states that there was one previous provider that felt she did have bipolar disorder. She endorses periods where she has increased energy, flight of ideas, grandiose thinking and plans for the future for things that she's probably not going to be able to accomplish. She states she has rapid speech. Her husband states these periods can last one week. She also states she'll buy things impulsively.  Patient has a history of impulsive use of medications. The patient states that she will over use her anxiety medications or someone as muscle relaxants or pain medications. She states she'll do this to try to typically feel good. She is also engaged in cutting for emotional release.  She was most recently on Latuda however she states that it began to cause her to have some muscle light contractions? Dystonia. She states that she and her husband taper off the Latuda and she's been off of it now for 2-3 months. Her current psychiatric medications consist of Effexor, Lamictal, Provigil, trazodone and Xanax. She states overall these of been working well but her biggest complaint right now is anxiety. Husband states he is also treated for anxiety when he gets anxious then it caused her to get anxious. He states that any little thing such as dealing with something on the telephone makes her very anxious. She states that might be her biggest issue at the present time. Elements:  Duration:  As noted above. Associated Signs/Symptoms: Depression Symptoms:  depressed mood, anhedonia, insomnia, hopelessness, loss of energy/fatigue, disturbed sleep, increased appetite, (Hypo) Manic Symptoms:  Elevated Mood, Flight of Ideas, Museum/gallery curator  Extravagance, Grandiosity, Impulsivity, Pressured speech Anxiety Symptoms:  Excessive Worry, Psychotic Symptoms:  Denies psychotic symptoms in the  past or presently PTSD Symptoms: NA  Past Medical History:  Past Medical History  Diagnosis Date  . Osteoarthritis   . Osteoporosis   . Rheumatoid arthritis ([place])   . Thyroid disease   . Hypotension   . Constipation   . Anemia   . Bipolar 1 disorder ([place])   . Anxiety     Past Surgical History  Procedure Laterality Date  . Gastric bypass    . Cholecystectomy     Family History:  Family History  Problem Relation Age of Onset  . Depression Mother   . Dementia Mother    Social History:   Social History   Social History  . Marital Status: Married    Spouse Name: N/A  . Number of Children: N/A  . Years of Education: N/A   Social History Main Topics  . Smoking status: Never Smoker   . Smokeless tobacco: Not on file  . Alcohol Use: No  . Drug Use: No  . Sexual Activity: Yes   Other Topics Concern  . 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 Minimal childhood friend but has high school friends. Different rules when dad was gone."  Children/Grand-children:Shawn 32/1 Grandson age 69  Natural/Informal Support: Husband, parents, brother, son, neighbor, friend at [employer]   Substance Use:There is a documented history of alcohol abuse confirmed by the patient.  Occasional wine since age 19 years (couple times per year). History of "Hard liquoir"  Musculoskeletal: Strength & Muscle Tone: within normal limits Gait & Station: normal Patient leans: N/A  Psychiatric Specialty Exam: HPI  Review of Systems  Psychiatric/Behavioral: Negative for depression, suicidal ideas, hallucinations, memory loss and substance abuse. The patient is nervous/anxious and has insomnia (responding to trazodone).   All other systems reviewed and are negative.   There were no vitals taken for this visit.There is no weight on file to calculate BMI.  General Appearance: Fairly Groomed  Eye Contact:  Good  Speech:  Normal Rate  Volume:  Normal  Mood:  Good  Affect:  Constricted  Thought Process:  Circumstantial  Orientation:  Full (Time, Place, and Person)  Thought Content:  Negative  Suicidal Thoughts:  No  Homicidal Thoughts:  No  Memory:  Immediate;   Fair Recent;   Fair Remote;   Fair  Judgement:  Good  Insight:  Fair  Psychomotor Activity:  Negative  Concentration:  Good  Recall:  Good  Fund of Knowledge:Good  Language: Good  Akathisia:  Negative  Handed:    AIMS (if indicated):    Assets:  Communication Skills Desire for Improvement Social Support  ADL's:  Intact  Cognition: WNL  Sleep:  Good with trazodone    Is the patient at risk to self?  No. Has the patient been a risk to self in the past 6 months?  No. Has the patient been a risk to self within the distant past?  Yes.   Is the patient a risk to others?  No. Has the  patient been a risk to others in the past 6 months?  No. Has the patient been a risk to others within the distant past?  No.  Allergies:  No Known Allergies Current Medications: Current Outpatient Prescriptions  Medication Sig Dispense Refill  . ALPRAZolam (XANAX) 1 MG tablet Take 1 mg by mouth 4 (four) times daily as needed.     . Cholecalciferol (VITAMIN D3) 400 UNITS CAPS Take 400 Units by mouth daily. 30 capsule   .  esomeprazole (NEXIUM) 40 MG capsule Take 1 capsule (40 mg total) by mouth daily at 12 noon.    . hydroxychloroquine (PLAQUENIL) 200 MG tablet Take 200 mg by mouth daily. 200 mg twice a day Mon-Friday only    . modafinil (PROVIGIL) 200 MG tablet Take 1-2 tablets (200-400 mg total) by mouth 2 (two) times daily. 400 mg in the am and 200 mg at noon    . Multiple Vitamin (MULTI-VITAMINS) TABS Take by mouth.    . traZODone (DESYREL) 150 MG tablet Take 1 tablet (150 mg total) by mouth at bedtime. 14 tablet 0  . traZODone (DESYREL) 50 MG tablet Take 50 mg by mouth.    . venlafaxine (EFFEXOR) 100 MG tablet Take 100 mg by mouth 3 (three) times daily.     . vitamin B-12 (CYANOCOBALAMIN) 1000 MCG tablet Take by mouth.    . zoledronic acid (RECLAST) 5 MG/100ML SOLN injection Inject into the vein.    Marland Kitchen acetaminophen (TYLENOL) 500 MG tablet Take 500 mg by mouth every 6 (six) hours as needed for mild pain.    Marland Kitchen alendronate (FOSAMAX) 70 MG tablet Take 1 tablet (70 mg total) by mouth once a week. Take with a full glass of water on an empty stomach. (Patient not taking: Reported on 07/09/2015)    . benztropine (COGENTIN) 1 MG tablet Take 1 mg by mouth 2 (two) times daily. 1 mg in the morning and 2 mg at night    . busPIRone (BUSPAR) 10 MG tablet Take one half a tablet twice a day for one week then increase to one tablet twice daily 60 tablet 1  . gabapentin (NEURONTIN) 100 MG capsule Take 2 capsules (200 mg total) by mouth 2 (two) times daily. (Patient not taking: Reported on 07/09/2015) 120 capsule 0  . HYDROcodone-acetaminophen (NORCO/VICODIN) 5-325 MG tablet Take 1 tablet by mouth every 4 (four) hours as needed for moderate pain. (Patient not taking: Reported on 07/09/2015) 12 tablet 0  . lactulose (CEPHULAC) 20 G packet Take by mouth.    . levothyroxine (SYNTHROID, LEVOTHROID) 100 MCG tablet Take 1 tablet (100 mcg total) by mouth daily before breakfast. (Patient taking differently: Take 112 mcg by mouth daily before  breakfast. )    . loratadine (CLARITIN) 10 MG tablet Take 10 mg by mouth.    . lurasidone (LATUDA) 80 MG TABS tablet Take 120 mg by mouth daily with breakfast.    . Oxycodone HCl 10 MG TABS Take 10 mg by mouth.    . predniSONE (DELTASONE) 20 MG tablet Take 2 tablets (40 mg total) by mouth daily. (Patient not taking: Reported on 05/14/2015) 10 tablet 0  . predniSONE (STERAPRED UNI-PAK 21 TAB) 10 MG (21) TBPK tablet     . psyllium (TGT PSYLLIUM FIBER) 0.52 G capsule Take by mouth.    . sucralfate (CARAFATE) 1 G tablet Take 1 g by mouth 2 (two) times daily as needed (stomach pain).     . ursodiol (ACTIGALL) 300 MG capsule Take 1  capsule (300 mg total) by mouth 4 (four) times daily.    Marland Kitchen venlafaxine XR (EFFEXOR-XR) 150 MG 24 hr capsule Take 1 capsule (150 mg total) by mouth daily. (Patient not taking: Reported on 05/14/2015) 30 capsule 0   No current facility-administered medications for this visit.    Previous Psychotropic Medications: Yes  Patient reports lithium made her "head buzz". She's been on Pamelor and states it caused her to scream and cry. She states that [place] caused her to have grimacing in her face and in her toes. She relates that wrist but all work for a while. She states that Abilify did not work. She states she took Prozac with good results from the 80s up until 2004. She states that eventually stopped working. She states that she has had trials of Paxil, Zoloft and Lexapro but they never worked. She states she has not been on Cymbalta nor Celexa. She states that she has not been on Seroquel, Geodon or Invega.  Currently patient takes Effexor 100 mg 3 times a day, Lamictal 150 mg twice a day, Provigil 400 mg in the morning and 200 mg with lunch. Substance Abuse History in the last 12 months:  No.  Consequences of Substance Abuse: Negative  Medical Decision Making:  New Problem, with no additional work-up planned (3), Review of Medication Regimen & Side Effects (2) and Review of  New Medication or Change in Dosage (2)  Treatment Plan Summary: Medication management and Plan   Generalized anxiety disorder-patient is already on a regimen of Effexor and Xanax. She is still requesting help with anxiety. I discussed the option might be to switch to another antidepressant although she has been on several as discussed above with either no results. Alternatively we can try to see what we can add on such as BuSpar. Patient states she is artery been on Klonopin and it was ineffective. Risk and benefit of BuSpar been discussed per stable consent we will start BuSpar 5 mg twice a day for 7 days and then she'll increase to 10 mg twice a day. Major depressive disorder, recurrent, severe-we will continue her on Effexor. She is also receiving some Lamictal.   Bipolar disorder-patient reports that she has been diagnosed with bipolar disorder and describes some symptoms of mania and hypomania. At this time she is now on Lamictal. She was on [place] but she discontinued that 2-3 months ago due to what may have been dystonic reactions. Patient will follow up in 1 month.   She's been encouraged call with questions concerns prior to her next appointment. In regards to risk assessment the patient has past suicide attempts, affective illness and race as risk factors. She has protective factors of good social support, engage in treatment, gender. In addition her husband indicates he holds her pills due to her history of overuse. At this time low risk of imminent harm to herself or others.    Faith Rogue 12/9/201612:12 PM

## 2015-07-14 ENCOUNTER — Other Ambulatory Visit: Payer: Self-pay | Admitting: Hematology and Oncology

## 2015-07-14 ENCOUNTER — Inpatient Hospital Stay: Payer: Medicare Other | Attending: Hematology and Oncology

## 2015-07-14 DIAGNOSIS — Z79899 Other long term (current) drug therapy: Secondary | ICD-10-CM | POA: Insufficient documentation

## 2015-07-14 DIAGNOSIS — E538 Deficiency of other specified B group vitamins: Secondary | ICD-10-CM | POA: Diagnosis not present

## 2015-07-14 MED ORDER — CYANOCOBALAMIN 1000 MCG/ML IJ SOLN
1000.0000 ug | Freq: Once | INTRAMUSCULAR | Status: AC
  Administered 2015-07-14: 1000 ug via INTRAMUSCULAR
  Filled 2015-07-14: qty 1

## 2015-07-19 ENCOUNTER — Telehealth: Payer: Self-pay

## 2015-07-19 NOTE — Telephone Encounter (Signed)
received a faxed - (see your basket outside office door) requesting  trazodone 50mg . pt was seen on  07-09-15 as a new patient.  no follow up appt was made. ? if you are refilling medications.

## 2015-07-22 ENCOUNTER — Other Ambulatory Visit: Payer: Self-pay

## 2015-07-22 NOTE — Telephone Encounter (Signed)
last filed 03-23-15 xanax 1 mg take 1 tablet 4 times daily #[phone]

## 2015-07-22 NOTE — Telephone Encounter (Signed)
pt called states she needs refills on xanax.  pt is a new pt that was seen on 07-09-15, next appt 08-25-15.  pt was seeing a dr. [facility] put care was transfered here.  pt states she uses [facility] home delivery and a 3 month supply if possible.  pt states that their insurance will change at the beginning of the year and they will have to pay more for the new insurance medication coverage so they are trying to get everything refill before the beginning of the year.

## 2015-07-23 MED ORDER — ALPRAZOLAM 1 MG PO TABS: 1.0000 mg | ORAL_TABLET | Freq: Four times a day (QID) | ORAL | Status: DC | PRN

## 2015-07-23 NOTE — Telephone Encounter (Signed)
rx was faxed and confirmed-  rx for alprazolam (xanax) 1mg  id # [id number] order # [id number]

## 2015-07-29 ENCOUNTER — Other Ambulatory Visit: Payer: Self-pay

## 2015-07-29 DIAGNOSIS — R933 Abnormal findings on diagnostic imaging of other parts of digestive tract: Secondary | ICD-10-CM | POA: Diagnosis not present

## 2015-07-29 DIAGNOSIS — R7989 Other specified abnormal findings of blood chemistry: Secondary | ICD-10-CM | POA: Diagnosis not present

## 2015-07-29 NOTE — Telephone Encounter (Signed)
pt needs a refill on trazodone 50mg  take 1 to 4 tablets nightly, if needed for insomnia.  (pt is a new patient and needs refill.)  last seen on 07-09-15 next appt 08-25-15.

## 2015-08-03 MED ORDER — TRAZODONE HCL 50 MG PO TABS: ORAL_TABLET | ORAL | Status: DC

## 2015-08-04 ENCOUNTER — Telehealth: Payer: Self-pay

## 2015-08-04 MED ORDER — TRAZODONE HCL 50 MG PO TABS: ORAL_TABLET | ORAL | Status: DC

## 2015-08-04 MED ORDER — ALPRAZOLAM 1 MG PO TABS: 1.0000 mg | ORAL_TABLET | Freq: Four times a day (QID) | ORAL | Status: DC | PRN

## 2015-08-04 NOTE — Telephone Encounter (Signed)
rx faxed id # [id number] order # [id number] xanax 1 mg

## 2015-08-04 NOTE — Telephone Encounter (Signed)
request received to get a 90 day supply for alprazolam and for trazodone.

## 2015-08-12 DIAGNOSIS — M791 Myalgia: Secondary | ICD-10-CM | POA: Diagnosis not present

## 2015-08-12 DIAGNOSIS — E039 Hypothyroidism, unspecified: Secondary | ICD-10-CM | POA: Diagnosis not present

## 2015-08-12 DIAGNOSIS — R69 Illness, unspecified: Secondary | ICD-10-CM | POA: Diagnosis not present

## 2015-08-12 DIAGNOSIS — M064 Inflammatory polyarthropathy: Secondary | ICD-10-CM | POA: Diagnosis not present

## 2015-08-12 DIAGNOSIS — J0191 Acute recurrent sinusitis, unspecified: Secondary | ICD-10-CM | POA: Diagnosis not present

## 2015-08-13 ENCOUNTER — Other Ambulatory Visit: Payer: Self-pay | Admitting: Hematology and Oncology

## 2015-08-13 ENCOUNTER — Inpatient Hospital Stay: Payer: Medicare HMO | Attending: Hematology and Oncology

## 2015-08-13 DIAGNOSIS — R799 Abnormal finding of blood chemistry, unspecified: Secondary | ICD-10-CM | POA: Diagnosis not present

## 2015-08-13 DIAGNOSIS — E538 Deficiency of other specified B group vitamins: Secondary | ICD-10-CM | POA: Diagnosis not present

## 2015-08-13 DIAGNOSIS — Z79899 Other long term (current) drug therapy: Secondary | ICD-10-CM | POA: Diagnosis not present

## 2015-08-13 DIAGNOSIS — E039 Hypothyroidism, unspecified: Secondary | ICD-10-CM | POA: Diagnosis not present

## 2015-08-13 DIAGNOSIS — M069 Rheumatoid arthritis, unspecified: Secondary | ICD-10-CM | POA: Diagnosis not present

## 2015-08-13 MED ORDER — CYANOCOBALAMIN 1000 MCG/ML IJ SOLN
1000.0000 ug | Freq: Once | INTRAMUSCULAR | Status: AC
  Administered 2015-08-13: 1000 ug via INTRAMUSCULAR
  Filled 2015-08-13: qty 1

## 2015-08-16 ENCOUNTER — Other Ambulatory Visit: Payer: Self-pay

## 2015-08-16 NOTE — Telephone Encounter (Signed)
pt needed rx to be sent to [facility] for the trazodone , rx was sent to [facility] and they will not process or transfer rx. so a new rx will need to be sent to [facility]

## 2015-08-17 MED ORDER — TRAZODONE HCL 50 MG PO TABS: ORAL_TABLET | ORAL | Status: DC

## 2015-08-23 ENCOUNTER — Telehealth: Payer: Self-pay | Admitting: Radiology

## 2015-08-23 DIAGNOSIS — M797 Fibromyalgia: Secondary | ICD-10-CM | POA: Diagnosis not present

## 2015-08-23 DIAGNOSIS — Z79899 Other long term (current) drug therapy: Secondary | ICD-10-CM | POA: Diagnosis not present

## 2015-08-23 DIAGNOSIS — M0579 Rheumatoid arthritis with rheumatoid factor of multiple sites without organ or systems involvement: Secondary | ICD-10-CM | POA: Diagnosis not present

## 2015-08-25 ENCOUNTER — Encounter: Payer: Self-pay | Admitting: Psychiatry

## 2015-08-25 ENCOUNTER — Ambulatory Visit (INDEPENDENT_AMBULATORY_CARE_PROVIDER_SITE_OTHER): Payer: Medicare HMO | Admitting: Psychiatry

## 2015-08-25 VITALS — BP 122/86 | HR 103 | Temp 97.2°F | Ht 66.0 in | Wt 262.6 lb

## 2015-08-25 DIAGNOSIS — F3170 Bipolar disorder, currently in remission, most recent episode unspecified: Secondary | ICD-10-CM

## 2015-08-25 DIAGNOSIS — F411 Generalized anxiety disorder: Secondary | ICD-10-CM

## 2015-08-25 MED ORDER — BUSPIRONE HCL 10 MG PO TABS: ORAL_TABLET | ORAL | Status: DC

## 2015-08-25 NOTE — Patient Instructions (Signed)
Take one and a half of your current tablets twice daily for one week, then increase to two tablets twice daily.

## 2015-08-25 NOTE — Progress Notes (Signed)
BH MD/PA/NP OP Progress Note  08/25/2015 11:59 AM Jamie Bennett  MRN:  [id number]  Subjective:  Patient returns for follow-up of her generalized anxiety disorder and  bipolar disorder. He presented to the first time to this clinic 1 month ago on Effexor, Lamictal, Provigil, trazodone and Xanax. She states she's been relatively stable on these medications but states she is to have issues with anxiety. She discussed that sometimes the relationship she has with her husband intensifies her anxiety because he is "type A." She states then her anxiety can build up because he is constantly checking on things such as their insurance status bills and finances. She feels overall her mood is been stable aside from anxiety. She denies any self-interest behavior or suicidal thoughts. At the last visit we added BuSpar to her medication regimen. She states she has not noticed any benefit at this time. Chief Complaint: Anxiety Chief Complaint    Follow-up; Medication Refill; Weight Gain     Visit Diagnosis:     ICD-9-CM ICD-10-CM   1. GAD (generalized anxiety disorder) 300.02 F41.1   2. Bipolar disorder in full remission, most recent episode unspecified type ([place]) 296.80 F31.70     Past Medical History:  Past Medical History  Diagnosis Date  . Osteoarthritis   . Osteoporosis   . Rheumatoid arthritis ([place])   . Thyroid disease   . Hypotension   . Constipation   . Anemia   . Bipolar 1 disorder ([place])   . Anxiety   . Fibromyalgia     Past Surgical History  Procedure Laterality Date  . Gastric bypass    . Cholecystectomy     Family History:  Family History  Problem Relation Age of Onset  . Depression Mother   . Dementia Mother   . Heart disease Mother   . Parkinson's disease Father    Social History:  Social History   Social History  . Marital Status: Married    Spouse Name: N/A  . Number of Children: N/A  . Years of Education: N/A   Social History Main Topics  . Smoking status:  Never Smoker   . Smokeless tobacco: None  . Alcohol Use: No  . Drug Use: No  . Sexual Activity: Yes   Other Topics Concern  . None   Social History Narrative   Additional History:   Assessment:   Musculoskeletal: Strength & Muscle Tone: within normal limits Gait & Station: normal Patient leans: N/A  Psychiatric Specialty Exam: HPI  Review of Systems  Psychiatric/Behavioral: Negative for depression, suicidal ideas, hallucinations, memory loss and substance abuse. The patient is nervous/anxious. The patient does not have insomnia.   All other systems reviewed and are negative.   Blood pressure 122/86, pulse 103, temperature 97.2 F (36.2 C), temperature source Tympanic, height 5\' 6"  (1.676 m), weight 262 lb 9.6 oz (119.115 kg), SpO2 99 %.Body mass index is 42.41 kg/(m^2).  General Appearance: Fairly Groomed  Eye Contact:  Good  Speech:  Normal Rate  Volume:  Normal  Mood:  Okay  Affect:  Constricted  Thought Process:  Circumstantial  Orientation:  Full (Time, Place, and Person)  Thought Content:  Negative  Suicidal Thoughts:  No  Homicidal Thoughts:  No  Memory:  Immediate;   Good Recent;   Good Remote;   Good  Judgement:  Good  Insight:  Fair  Psychomotor Activity:  Negative  Concentration:  Good  Recall:  Good  Fund of Knowledge: Good  Language: Good  Akathisia:  Negative  Handed:   AIMS (if indicated):    Assets:  Desire for Improvement Social Support  ADL's:  Intact  Cognition: WNL  Sleep:  good   Is the patient at risk to self?  No. Has the patient been a risk to self in the past 6 months?  No. Has the patient been a risk to self within the distant past?  Yes.   Is the patient a risk to others?  No. Has the patient been a risk to others in the past 6 months?  No. Has the patient been a risk to others within the distant past?  No.  Current Medications: Current Outpatient Prescriptions  Medication Sig Dispense Refill  . ALPRAZolam (XANAX) 1 MG  tablet Take 1 tablet (1 mg total) by mouth 4 (four) times daily as needed. 360 tablet 0  . benztropine (COGENTIN) 1 MG tablet Take 1 mg by mouth 2 (two) times daily. 1 mg in the morning and 2 mg at night    . Biotin 5000 MCG TABS Take by mouth.    . busPIRone (BUSPAR) 10 MG tablet Take 15 mg (one and half tablet) twice daily for one week, then increase to 20 mg  (two tablets) twice daily. 120 tablet 4  . Cholecalciferol (HM VITAMIN D3) 4000 units CAPS Take 4,000 capsules by mouth daily.    . cyanocobalamin (,VITAMIN B-12,) 1000 MCG/ML injection Inject 1,000 mcg into the muscle every 30 (thirty) days.    Marland Kitchen esomeprazole (NEXIUM) 40 MG capsule Take 1 capsule (40 mg total) by mouth daily at 12 noon.    . hydroxychloroquine (PLAQUENIL) 200 MG tablet Take 200 mg by mouth daily. 200 mg twice a day Mon-Friday only    . lamoTRIgine (LAMICTAL) 150 MG tablet Take 150 tablets by mouth 2 (two) times daily.    Marland Kitchen levothyroxine (SYNTHROID, LEVOTHROID) 112 MCG tablet Take 112 mcg by mouth daily.    . modafinil (PROVIGIL) 200 MG tablet Take 1-2 tablets (200-400 mg total) by mouth 2 (two) times daily. 400 mg in the am and 200 mg at noon    . Multiple Vitamin (MULTI-VITAMINS) TABS Take by mouth.    . traZODone (DESYREL) 50 MG tablet Take 1- 4 tablets nightly if needed for insomnia 360 tablet 0  . venlafaxine (EFFEXOR) 100 MG tablet Take 100 mg by mouth 3 (three) times daily.     . zoledronic acid (RECLAST) 5 MG/100ML SOLN injection Inject into the vein.     No current facility-administered medications for this visit.    Medical Decision Making:  Established Problem, Stable/Improving (1), Review of Medication Regimen & Side Effects (2) and Review of New Medication or Change in Dosage (2)  Treatment Plan Summary:Medication management and Plan Plan Generalized anxiety disorder-patient is already on a regimen of Effexor and Xanax. She is still requesting help with anxiety. I discussed the option might be to switch to  another antidepressant although she has been on several as discussed above with either no results. We will continue her alprazolam 1 mg 4 times a day as needed. She will continue her Effexor 100 mg 3 times daily. We will increase her BuSpar from 10 mg twice daily to 15 mg twice daily for 1 week and then she'll go to 20 mg twice daily.  Bipolar disorder-patient reports that she has been diagnosed with bipolar disorder and describes some symptoms of mania and hypomania. At this time she is now on Lamictal. She was on [place] but she discontinued  that 2-3 months ago due to what may have been dystonic reactions. Patient will follow up in 1 month.   I made patient aware of my departure from the practice. I discussed with her that she follow-up with me one more time prior to leave or sees in new doctor in 2-3 months. Patient states she would rather wait and see the new doctor in 2-3 months. I've encouraged call me with any questions or concerns and made her aware I will be here until 09/10/2015. I've also encouraged call with questions or concerns after that time as she can be triaged for assistance.  Faith Rogue 08/25/2015, 11:59 AM

## 2015-09-13 ENCOUNTER — Inpatient Hospital Stay: Payer: Medicare HMO

## 2015-09-13 ENCOUNTER — Inpatient Hospital Stay (HOSPITAL_BASED_OUTPATIENT_CLINIC_OR_DEPARTMENT_OTHER): Payer: Medicare HMO | Admitting: Internal Medicine

## 2015-09-13 ENCOUNTER — Inpatient Hospital Stay: Payer: Medicare HMO | Attending: Internal Medicine

## 2015-09-13 VITALS — BP 125/84 | HR 86 | Temp 96.5°F | Resp 18 | Ht 66.0 in | Wt 268.2 lb

## 2015-09-13 DIAGNOSIS — R69 Illness, unspecified: Secondary | ICD-10-CM | POA: Diagnosis not present

## 2015-09-13 DIAGNOSIS — K5900 Constipation, unspecified: Secondary | ICD-10-CM | POA: Diagnosis not present

## 2015-09-13 DIAGNOSIS — Z9884 Bariatric surgery status: Secondary | ICD-10-CM | POA: Insufficient documentation

## 2015-09-13 DIAGNOSIS — M818 Other osteoporosis without current pathological fracture: Secondary | ICD-10-CM | POA: Insufficient documentation

## 2015-09-13 DIAGNOSIS — M069 Rheumatoid arthritis, unspecified: Secondary | ICD-10-CM | POA: Insufficient documentation

## 2015-09-13 DIAGNOSIS — I959 Hypotension, unspecified: Secondary | ICD-10-CM

## 2015-09-13 DIAGNOSIS — F418 Other specified anxiety disorders: Secondary | ICD-10-CM | POA: Diagnosis not present

## 2015-09-13 DIAGNOSIS — Z79899 Other long term (current) drug therapy: Secondary | ICD-10-CM

## 2015-09-13 DIAGNOSIS — E538 Deficiency of other specified B group vitamins: Secondary | ICD-10-CM

## 2015-09-13 DIAGNOSIS — E079 Disorder of thyroid, unspecified: Secondary | ICD-10-CM | POA: Insufficient documentation

## 2015-09-13 DIAGNOSIS — M797 Fibromyalgia: Secondary | ICD-10-CM

## 2015-09-13 DIAGNOSIS — F319 Bipolar disorder, unspecified: Secondary | ICD-10-CM

## 2015-09-13 DIAGNOSIS — D72819 Decreased white blood cell count, unspecified: Secondary | ICD-10-CM | POA: Insufficient documentation

## 2015-09-13 DIAGNOSIS — F419 Anxiety disorder, unspecified: Secondary | ICD-10-CM | POA: Diagnosis not present

## 2015-09-13 DIAGNOSIS — D509 Iron deficiency anemia, unspecified: Secondary | ICD-10-CM | POA: Diagnosis not present

## 2015-09-13 DIAGNOSIS — M1990 Unspecified osteoarthritis, unspecified site: Secondary | ICD-10-CM

## 2015-09-13 LAB — CBC WITH DIFFERENTIAL/PLATELET
Basophils Absolute: 0 10*3/uL (ref 0–0.1)
Basophils Relative: 1 %
Eosinophils Absolute: 0.1 10*3/uL (ref 0–0.7)
Eosinophils Relative: 3 %
HCT: 39.5 % (ref 35.0–47.0)
Hemoglobin: 13.4 g/dL (ref 12.0–16.0)
Lymphocytes Relative: 37 %
Lymphs Abs: 1.6 10*3/uL (ref 1.0–3.6)
MCH: 31.3 pg (ref 26.0–34.0)
MCHC: 33.9 g/dL (ref 32.0–36.0)
MCV: 92.3 fL (ref 80.0–100.0)
Monocytes Absolute: 0.3 10*3/uL (ref 0.2–0.9)
Monocytes Relative: 8 %
Neutro Abs: 2.2 10*3/uL (ref 1.4–6.5)
Neutrophils Relative %: 51 %
Platelets: 234 10*3/uL (ref 150–440)
RBC: 4.28 MIL/uL (ref 3.80–5.20)
RDW: 12.6 % (ref 11.5–14.5)
WBC: 4.3 10*3/uL (ref 3.6–11.0)

## 2015-09-13 LAB — SEDIMENTATION RATE: Sed Rate: 21 mm/hr (ref 0–30)

## 2015-09-13 LAB — IRON AND TIBC
Iron: 73 ug/dL (ref 28–170)
Saturation Ratios: 15 % (ref 10.4–31.8)
TIBC: 487 ug/dL — ABNORMAL HIGH (ref 250–450)
UIBC: 414 ug/dL

## 2015-09-13 LAB — FERRITIN: Ferritin: 14 ng/mL (ref 11–307)

## 2015-09-13 MED ORDER — CYANOCOBALAMIN 1000 MCG/ML IJ SOLN
1000.0000 ug | Freq: Once | INTRAMUSCULAR | Status: AC
Start: 2015-09-13 — End: 2015-09-13
  Administered 2015-09-13: 1000 ug via INTRAMUSCULAR
  Filled 2015-09-13: qty 1

## 2015-09-13 NOTE — Progress Notes (Signed)
[facility] day:  09/13/2015  Chief Complaint: Jamie Bennett is a 55 y.o. female status post gastric bypass surgery with iron deficiency anemia, B12 deficiency, and leukopenia who is seen for 3 month assessment.  HPI: The patient was last seen in the medical oncology clinic on 02/12/2015.  At that time, she was seen for initial assessment by me.  Symptomatically she noted fatigue. She had ongoing issues with her rheumatoid arthritis. She was on Plaquenil.  Labs from 02/05/2015 had included a hematocrit of 38.8, hemoglobin 12.7, MCV 97.7, platelets 212,000 and white count 3300.  Ferritin was 38 and iron saturation of 19%. She received B12.  She has continued to receive B12 monthly.  Current status: Jamie Bennett returns to our clinic for a follow-up visit. She continues to complain about fatigue and low energy level. She was recently diagnosed with fibromyalgia. She appears to be anxious and depressed, but denies suicidal homicidal ideations. She denies nausea, vomiting, diarrhea, bleeding, constipation, night sweats, weight loss. She is up-to-date with the mammogram and claims that colonoscopy was done 6 years ago.  Past Medical History  Diagnosis Date  . Osteoarthritis   . Osteoporosis   . Rheumatoid arthritis ([place])   . Thyroid disease   . Hypotension   . Constipation   . Anemia   . Bipolar 1 disorder ([place])   . Anxiety   . Fibromyalgia     Past Surgical History  Procedure Laterality Date  . Gastric bypass    . Cholecystectomy      Family History  Problem Relation Age of Onset  . Depression Mother   . Dementia Mother   . Heart disease Mother   . Parkinson's disease Father     Social History:  reports that she has never smoked. She does not have any smokeless tobacco history on file. She reports that she does not drink alcohol or use illicit drugs.  The patient is accompanied by her husband today.  Allergies:  Allergies   Allergen Reactions  . No Known Allergies     Current Medications: Current Outpatient Prescriptions  Medication Sig Dispense Refill  . ALPRAZolam (XANAX) 1 MG tablet Take 1 tablet (1 mg total) by mouth 4 (four) times daily as needed. 360 tablet 0  . benztropine (COGENTIN) 1 MG tablet Take 1 mg by mouth 2 (two) times daily. 1 mg in the morning and 2 mg at night    . Biotin 5000 MCG TABS Take by mouth.    . busPIRone (BUSPAR) 10 MG tablet Take 15 mg (one and half tablet) twice daily for one week, then increase to 20 mg  (two tablets) twice daily. 120 tablet 4  . Cholecalciferol (HM VITAMIN D3) 4000 units CAPS Take 4,000 capsules by mouth daily.    . cyanocobalamin (,VITAMIN B-12,) 1000 MCG/ML injection Inject 1,000 mcg into the muscle every 30 (thirty) days.    Marland Kitchen esomeprazole (NEXIUM) 40 MG capsule Take 1 capsule (40 mg total) by mouth daily at 12 noon.    . hydroxychloroquine (PLAQUENIL) 200 MG tablet Take 200 mg by mouth daily. 200 mg twice a day Mon-Friday only    . lamoTRIgine (LAMICTAL) 150 MG tablet Take 150 tablets by mouth 2 (two) times daily.    Marland Kitchen levothyroxine (SYNTHROID, LEVOTHROID) 112 MCG tablet Take 112 mcg by mouth daily.    . modafinil (PROVIGIL) 200 MG tablet Take 1-2 tablets (200-400 mg total) by mouth 2 (two) times daily. 400 mg  in the am and 200 mg at noon    . Multiple Vitamin (MULTI-VITAMINS) TABS Take by mouth.    Marland Kitchen tiZANidine (ZANAFLEX) 4 MG capsule Take 4 mg by mouth 2 (two) times daily.    . traZODone (DESYREL) 50 MG tablet Take 1- 4 tablets nightly if needed for insomnia 360 tablet 0  . venlafaxine (EFFEXOR) 100 MG tablet Take 100 mg by mouth 3 (three) times daily.     . zoledronic acid (RECLAST) 5 MG/100ML SOLN injection Inject into the vein.     No current facility-administered medications for this visit.    Review of Systems:  GENERAL:  Fatigue.  No fevers, sweats or weight loss. PERFORMANCE STATUS (ECOG):  1 HEENT:  No visual changes, runny nose, sore  throat, mouth sores or tenderness. Lungs: No shortness of breath or cough.  No hemoptysis. Cardiac:  No chest pain, palpitations, orthopnea, or PND. GI:  No nausea, vomiting, diarrhea, constipation, melena or hematochezia. GU:  No urgency, frequency, dysuria, or hematuria. Musculoskeletal:  Muscles aching pushing around a wheelchair.  Joint pain. Extremities:  No pain or swelling. Skin:  No rashes or skin changes. Neuro:  No headache, numbness or weakness, balance or coordination issues. Endocrine:  No diabetes, thyroid issues, hot flashes or night sweats. Psych:  No mood changes, depression or anxiety. Pain:  Joint pain. Review of systems:  All other systems reviewed and found to be negative.  Physical Exam: There were no vitals taken for this visit. GENERAL:  Well developed, well nourished, sitting comfortably in the exam room in no acute distress.  Soft spoken. MENTAL STATUS:  Alert and oriented to person, place and time. HEAD:  Long brown hair.  Normocephalic, atraumatic, face symmetric, no Cushingoid features. EYES:  Gold rimmed glasses.  Brown eyes.  Pupils equal round and reactive to light and accomodation.  No conjunctivitis or scleral icterus. ENT:  Oropharynx clear without lesion.  Tongue normal. Mucous membranes moist.  RESPIRATORY:  Clear to auscultation without rales, wheezes or rhonchi. CARDIOVASCULAR:  Regular rate and rhythm without murmur, rub or gallop. ABDOMEN:  Fully round.  Soft, non-tender, with active bowel sounds, and no hepatosplenomegaly.  No masses. SKIN:  Antecubital bruises (left > right).  No rashes, ulcers or lesions. EXTREMITIES: No edema, no skin discoloration or tenderness.  No palpable cords. LYMPH NODES: No palpable cervical, supraclavicular, axillary or inguinal adenopathy  NEUROLOGICAL: Unremarkable. PSYCH:  Appropriate.  Appointment on 09/13/2015  Component Date Value Ref Range Status  . WBC 09/13/2015 4.3  3.6 - 11.0 K/uL Final  . RBC  09/13/2015 4.28  3.80 - 5.20 MIL/uL Final  . Hemoglobin 09/13/2015 13.4  12.0 - 16.0 g/dL Final  . HCT 09/13/2015 39.5  35.0 - 47.0 % Final  . MCV 09/13/2015 92.3  80.0 - 100.0 fL Final  . MCH 09/13/2015 31.3  26.0 - 34.0 pg Final  . MCHC 09/13/2015 33.9  32.0 - 36.0 g/dL Final  . RDW 09/13/2015 12.6  11.5 - 14.5 % Final  . Platelets 09/13/2015 234  150 - 440 K/uL Final  . Neutrophils Relative % 09/13/2015 51   Final  . Neutro Abs 09/13/2015 2.2  1.4 - 6.5 K/uL Final  . Lymphocytes Relative 09/13/2015 37   Final  . Lymphs Abs 09/13/2015 1.6  1.0 - 3.6 K/uL Final  . Monocytes Relative 09/13/2015 8   Final  . Monocytes Absolute 09/13/2015 0.3  0.2 - 0.9 K/uL Final  . Eosinophils Relative 09/13/2015 3   Final  .  Eosinophils Absolute 09/13/2015 0.1  0 - 0.7 K/uL Final  . Basophils Relative 09/13/2015 1   Final  . Basophils Absolute 09/13/2015 0.0  0 - 0.1 K/uL Final  . Ferritin 09/13/2015 14  11 - 307 ng/mL Final  . Iron 09/13/2015 73  28 - 170 ug/dL Final  . TIBC 09/13/2015 487* 250 - 450 ug/dL Final  . Saturation Ratios 09/13/2015 15  10.4 - 31.8 % Final  . UIBC 09/13/2015 414   Final  . Sed Rate 09/13/2015 21  0 - 30 mm/hr Final    Assessment:  AVALIN BRILEY is a 55 y.o. female female with a history of gastric bypass surgery in 2003. She has B12 deficiency. She has a history of iron deficiency anemia. Last colonoscopy was 7 years ago. Diet is good. She eats rich foods. She Is unable to absorb oral iron. She received IV in 02/2014. Ferritin was 38 and B12 619 on 02/05/2015. Iron saturation was 19%.  She also has a has a history of rheumatoid arthritis for which she received Humira in the past as well as methotrexate. Humira caused leukopenia. Methotrexate caused liver toxicity. She is currently on Plaquenil.   She continues to be fatigued and iron studies suggest further decline in ferritin, which is 14, at the lower limit of normal today, and TIBC is increased. We will  administer feraheme tomorrow and one week later. We will recheck ferritin in one month and then in 3 months.  Plan: 1. Labs today:  CBC with diff, ferritin, iron studies, folate. 2. B12 today. 3. RTC monthly for B12. Feraheme February 14 and 09/21/2015 4. RTC in 3 months for MD assessment, labs (CBC with diff, ferritin, iron studies, ESR), and B12.   Roxana Hires, MD  09/13/2015, 3:06 PM

## 2015-09-13 NOTE — Progress Notes (Signed)
Pt reports no changes other than Fibromyalgia diagnosis.  Pt reports having severe fatigue.  Pt reports nervousness and anxiety related to life events.  Emotional support provided pt see's spcialist for depression concerns.

## 2015-09-14 ENCOUNTER — Inpatient Hospital Stay: Payer: Medicare HMO

## 2015-09-15 ENCOUNTER — Inpatient Hospital Stay: Payer: Medicare HMO

## 2015-09-15 VITALS — BP 140/70 | HR 73 | Temp 96.9°F | Resp 18

## 2015-09-15 DIAGNOSIS — E538 Deficiency of other specified B group vitamins: Secondary | ICD-10-CM

## 2015-09-15 DIAGNOSIS — D509 Iron deficiency anemia, unspecified: Secondary | ICD-10-CM | POA: Diagnosis not present

## 2015-09-15 MED ORDER — SODIUM CHLORIDE 0.9 % IV SOLN
510.0000 mg | Freq: Once | INTRAVENOUS | Status: AC
  Administered 2015-09-15: 510 mg via INTRAVENOUS
  Filled 2015-09-15: qty 17

## 2015-09-15 MED ORDER — SODIUM CHLORIDE 0.9 % IV SOLN
INTRAVENOUS | Status: DC
  Administered 2015-09-15: 14:00:00 via INTRAVENOUS
  Filled 2015-09-15: qty 1000

## 2015-09-21 ENCOUNTER — Ambulatory Visit: Payer: Self-pay

## 2015-09-22 ENCOUNTER — Inpatient Hospital Stay: Payer: Medicare HMO

## 2015-09-22 VITALS — BP 121/84 | HR 78 | Temp 96.0°F | Resp 18

## 2015-09-22 DIAGNOSIS — D509 Iron deficiency anemia, unspecified: Secondary | ICD-10-CM | POA: Diagnosis not present

## 2015-09-22 DIAGNOSIS — E538 Deficiency of other specified B group vitamins: Secondary | ICD-10-CM

## 2015-09-22 MED ORDER — SODIUM CHLORIDE 0.9 % IV SOLN
510.0000 mg | Freq: Once | INTRAVENOUS | Status: AC
  Administered 2015-09-22: 510 mg via INTRAVENOUS
  Filled 2015-09-22: qty 17

## 2015-09-22 MED ORDER — SODIUM CHLORIDE 0.9 % IV SOLN
INTRAVENOUS | Status: DC
  Administered 2015-09-22: 15:00:00 via INTRAVENOUS
  Filled 2015-09-22: qty 1000

## 2015-10-01 NOTE — Telephone Encounter (Signed)
refilled 

## 2015-10-02 NOTE — Telephone Encounter (Signed)
error 

## 2015-10-11 ENCOUNTER — Inpatient Hospital Stay: Payer: Medicare HMO

## 2015-10-13 ENCOUNTER — Inpatient Hospital Stay: Payer: Medicare HMO

## 2015-10-13 ENCOUNTER — Inpatient Hospital Stay: Payer: Medicare HMO | Attending: Hematology and Oncology

## 2015-10-13 DIAGNOSIS — D509 Iron deficiency anemia, unspecified: Secondary | ICD-10-CM

## 2015-10-13 DIAGNOSIS — Z79899 Other long term (current) drug therapy: Secondary | ICD-10-CM | POA: Insufficient documentation

## 2015-10-13 DIAGNOSIS — E538 Deficiency of other specified B group vitamins: Secondary | ICD-10-CM | POA: Diagnosis not present

## 2015-10-13 LAB — FERRITIN: Ferritin: 373 ng/mL — ABNORMAL HIGH (ref 11–307)

## 2015-10-13 MED ORDER — CYANOCOBALAMIN 1000 MCG/ML IJ SOLN
1000.0000 ug | Freq: Once | INTRAMUSCULAR | Status: AC
  Administered 2015-10-13: 1000 ug via INTRAMUSCULAR
  Filled 2015-10-13: qty 1

## 2015-10-14 ENCOUNTER — Inpatient Hospital Stay: Payer: Medicare HMO

## 2015-11-08 ENCOUNTER — Encounter: Payer: Self-pay | Admitting: Emergency Medicine

## 2015-11-08 ENCOUNTER — Inpatient Hospital Stay
Admission: EM | Admit: 2015-11-08 | Discharge: 2015-11-19 | DRG: 917 | Disposition: A | Discharge status: Skilled Nursing Facility | Payer: Medicare HMO | Attending: Internal Medicine | Admitting: Internal Medicine

## 2015-11-08 ENCOUNTER — Emergency Department: Payer: Medicare HMO

## 2015-11-08 DIAGNOSIS — Z8249 Family history of ischemic heart disease and other diseases of the circulatory system: Secondary | ICD-10-CM

## 2015-11-08 DIAGNOSIS — K7200 Acute and subacute hepatic failure without coma: Secondary | ICD-10-CM | POA: Diagnosis present

## 2015-11-08 DIAGNOSIS — J15211 Pneumonia due to Methicillin susceptible Staphylococcus aureus: Secondary | ICD-10-CM | POA: Diagnosis present

## 2015-11-08 DIAGNOSIS — T50904S Poisoning by unspecified drugs, medicaments and biological substances, undetermined, sequela: Secondary | ICD-10-CM | POA: Diagnosis not present

## 2015-11-08 DIAGNOSIS — Z818 Family history of other mental and behavioral disorders: Secondary | ICD-10-CM

## 2015-11-08 DIAGNOSIS — A419 Sepsis, unspecified organism: Secondary | ICD-10-CM | POA: Diagnosis not present

## 2015-11-08 DIAGNOSIS — Z9884 Bariatric surgery status: Secondary | ICD-10-CM | POA: Diagnosis not present

## 2015-11-08 DIAGNOSIS — E876 Hypokalemia: Secondary | ICD-10-CM | POA: Diagnosis not present

## 2015-11-08 DIAGNOSIS — Y92009 Unspecified place in unspecified non-institutional (private) residence as the place of occurrence of the external cause: Secondary | ICD-10-CM | POA: Diagnosis not present

## 2015-11-08 DIAGNOSIS — F319 Bipolar disorder, unspecified: Secondary | ICD-10-CM | POA: Diagnosis present

## 2015-11-08 DIAGNOSIS — N179 Acute kidney failure, unspecified: Secondary | ICD-10-CM | POA: Diagnosis not present

## 2015-11-08 DIAGNOSIS — I959 Hypotension, unspecified: Secondary | ICD-10-CM | POA: Diagnosis not present

## 2015-11-08 DIAGNOSIS — M62838 Other muscle spasm: Secondary | ICD-10-CM | POA: Diagnosis not present

## 2015-11-08 DIAGNOSIS — R7401 Elevation of levels of liver transaminase levels: Secondary | ICD-10-CM

## 2015-11-08 DIAGNOSIS — R262 Difficulty in walking, not elsewhere classified: Secondary | ICD-10-CM | POA: Diagnosis not present

## 2015-11-08 DIAGNOSIS — R1319 Other dysphagia: Secondary | ICD-10-CM | POA: Diagnosis not present

## 2015-11-08 DIAGNOSIS — E162 Hypoglycemia, unspecified: Secondary | ICD-10-CM | POA: Diagnosis not present

## 2015-11-08 DIAGNOSIS — Z4682 Encounter for fitting and adjustment of non-vascular catheter: Secondary | ICD-10-CM | POA: Diagnosis not present

## 2015-11-08 DIAGNOSIS — R34 Anuria and oliguria: Secondary | ICD-10-CM | POA: Diagnosis not present

## 2015-11-08 DIAGNOSIS — N170 Acute kidney failure with tubular necrosis: Secondary | ICD-10-CM | POA: Diagnosis present

## 2015-11-08 DIAGNOSIS — R4182 Altered mental status, unspecified: Secondary | ICD-10-CM | POA: Diagnosis not present

## 2015-11-08 DIAGNOSIS — I10 Essential (primary) hypertension: Secondary | ICD-10-CM | POA: Diagnosis not present

## 2015-11-08 DIAGNOSIS — R7881 Bacteremia: Secondary | ICD-10-CM | POA: Diagnosis present

## 2015-11-08 DIAGNOSIS — B9562 Methicillin resistant Staphylococcus aureus infection as the cause of diseases classified elsewhere: Secondary | ICD-10-CM | POA: Diagnosis present

## 2015-11-08 DIAGNOSIS — R778 Other specified abnormalities of plasma proteins: Secondary | ICD-10-CM | POA: Diagnosis present

## 2015-11-08 DIAGNOSIS — R740 Nonspecific elevation of levels of transaminase and lactic acid dehydrogenase [LDH]: Secondary | ICD-10-CM

## 2015-11-08 DIAGNOSIS — M797 Fibromyalgia: Secondary | ICD-10-CM | POA: Diagnosis present

## 2015-11-08 DIAGNOSIS — M6281 Muscle weakness (generalized): Secondary | ICD-10-CM | POA: Diagnosis not present

## 2015-11-08 DIAGNOSIS — R7989 Other specified abnormal findings of blood chemistry: Secondary | ICD-10-CM | POA: Diagnosis not present

## 2015-11-08 DIAGNOSIS — T50904A Poisoning by unspecified drugs, medicaments and biological substances, undetermined, initial encounter: Secondary | ICD-10-CM

## 2015-11-08 DIAGNOSIS — R4781 Slurred speech: Secondary | ICD-10-CM | POA: Diagnosis not present

## 2015-11-08 DIAGNOSIS — D696 Thrombocytopenia, unspecified: Secondary | ICD-10-CM | POA: Diagnosis present

## 2015-11-08 DIAGNOSIS — M810 Age-related osteoporosis without current pathological fracture: Secondary | ICD-10-CM | POA: Diagnosis present

## 2015-11-08 DIAGNOSIS — Z820 Family history of epilepsy and other diseases of the nervous system: Secondary | ICD-10-CM | POA: Diagnosis not present

## 2015-11-08 DIAGNOSIS — R69 Illness, unspecified: Secondary | ICD-10-CM | POA: Diagnosis not present

## 2015-11-08 DIAGNOSIS — G92 Toxic encephalopathy: Secondary | ICD-10-CM | POA: Diagnosis present

## 2015-11-08 DIAGNOSIS — M069 Rheumatoid arthritis, unspecified: Secondary | ICD-10-CM | POA: Diagnosis present

## 2015-11-08 DIAGNOSIS — I4892 Unspecified atrial flutter: Secondary | ICD-10-CM | POA: Diagnosis not present

## 2015-11-08 DIAGNOSIS — B961 Klebsiella pneumoniae [K. pneumoniae] as the cause of diseases classified elsewhere: Secondary | ICD-10-CM | POA: Diagnosis present

## 2015-11-08 DIAGNOSIS — J9690 Respiratory failure, unspecified, unspecified whether with hypoxia or hypercapnia: Secondary | ICD-10-CM | POA: Diagnosis not present

## 2015-11-08 DIAGNOSIS — J1520 Pneumonia due to staphylococcus, unspecified: Secondary | ICD-10-CM | POA: Diagnosis not present

## 2015-11-08 DIAGNOSIS — N390 Urinary tract infection, site not specified: Secondary | ICD-10-CM | POA: Diagnosis present

## 2015-11-08 DIAGNOSIS — Z452 Encounter for adjustment and management of vascular access device: Secondary | ICD-10-CM | POA: Diagnosis not present

## 2015-11-08 DIAGNOSIS — R1312 Dysphagia, oropharyngeal phase: Secondary | ICD-10-CM | POA: Diagnosis not present

## 2015-11-08 DIAGNOSIS — J9600 Acute respiratory failure, unspecified whether with hypoxia or hypercapnia: Secondary | ICD-10-CM | POA: Diagnosis present

## 2015-11-08 DIAGNOSIS — T50901A Poisoning by unspecified drugs, medicaments and biological substances, accidental (unintentional), initial encounter: Principal | ICD-10-CM | POA: Diagnosis present

## 2015-11-08 DIAGNOSIS — D684 Acquired coagulation factor deficiency: Secondary | ICD-10-CM | POA: Diagnosis not present

## 2015-11-08 DIAGNOSIS — E039 Hypothyroidism, unspecified: Secondary | ICD-10-CM | POA: Diagnosis not present

## 2015-11-08 DIAGNOSIS — R570 Cardiogenic shock: Secondary | ICD-10-CM | POA: Diagnosis present

## 2015-11-08 DIAGNOSIS — J9811 Atelectasis: Secondary | ICD-10-CM | POA: Diagnosis not present

## 2015-11-08 DIAGNOSIS — Z915 Personal history of self-harm: Secondary | ICD-10-CM

## 2015-11-08 DIAGNOSIS — R109 Unspecified abdominal pain: Secondary | ICD-10-CM | POA: Diagnosis not present

## 2015-11-08 DIAGNOSIS — Z978 Presence of other specified devices: Secondary | ICD-10-CM

## 2015-11-08 DIAGNOSIS — I480 Paroxysmal atrial fibrillation: Secondary | ICD-10-CM | POA: Diagnosis not present

## 2015-11-08 DIAGNOSIS — M0520 Rheumatoid vasculitis with rheumatoid arthritis of unspecified site: Secondary | ICD-10-CM | POA: Diagnosis present

## 2015-11-08 DIAGNOSIS — F3130 Bipolar disorder, current episode depressed, mild or moderate severity, unspecified: Secondary | ICD-10-CM | POA: Diagnosis present

## 2015-11-08 DIAGNOSIS — E872 Acidosis: Secondary | ICD-10-CM | POA: Diagnosis present

## 2015-11-08 DIAGNOSIS — Z5189 Encounter for other specified aftercare: Secondary | ICD-10-CM | POA: Diagnosis not present

## 2015-11-08 DIAGNOSIS — R531 Weakness: Secondary | ICD-10-CM | POA: Diagnosis not present

## 2015-11-08 DIAGNOSIS — Z4659 Encounter for fitting and adjustment of other gastrointestinal appliance and device: Secondary | ICD-10-CM

## 2015-11-08 LAB — COMPREHENSIVE METABOLIC PANEL
ALT: 660 U/L — ABNORMAL HIGH (ref 14–54)
AST: 1804 U/L — ABNORMAL HIGH (ref 15–41)
Albumin: 3.5 g/dL (ref 3.5–5.0)
Alkaline Phosphatase: 140 U/L — ABNORMAL HIGH (ref 38–126)
Anion gap: 19 — ABNORMAL HIGH (ref 5–15)
BUN: 54 mg/dL — ABNORMAL HIGH (ref 6–20)
CO2: 8 mmol/L — ABNORMAL LOW (ref 22–32)
Calcium: 8 mg/dL — ABNORMAL LOW (ref 8.9–10.3)
Chloride: 108 mmol/L (ref 101–111)
Creatinine, Ser: 2.7 mg/dL — ABNORMAL HIGH (ref 0.44–1.00)
GFR calc Af Amer: 22 mL/min — ABNORMAL LOW (ref >60)
GFR calc non Af Amer: 19 mL/min — ABNORMAL LOW (ref >60)
Glucose, Bld: 79 mg/dL (ref 65–99)
Potassium: 5.4 mmol/L — ABNORMAL HIGH (ref 3.5–5.1)
Sodium: 135 mmol/L (ref 135–145)
Total Bilirubin: 1.3 mg/dL — ABNORMAL HIGH (ref 0.3–1.2)
Total Protein: 6.8 g/dL (ref 6.5–8.1)

## 2015-11-08 LAB — URINE DRUG SCREEN, QUALITATIVE (ARMC ONLY)
Amphetamines, Ur Screen: NOT DETECTED
Barbiturates, Ur Screen: NOT DETECTED
Benzodiazepine, Ur Scrn: POSITIVE — AB
Cannabinoid 50 Ng, Ur ~~LOC~~: NOT DETECTED
Cocaine Metabolite,Ur ~~LOC~~: NOT DETECTED
MDMA (Ecstasy)Ur Screen: NOT DETECTED
Methadone Scn, Ur: NOT DETECTED
Opiate, Ur Screen: NOT DETECTED
Phencyclidine (PCP) Ur S: NOT DETECTED
Tricyclic, Ur Screen: NOT DETECTED

## 2015-11-08 LAB — CBC
HCT: 36 % (ref 35.0–47.0)
Hemoglobin: 11.6 g/dL — ABNORMAL LOW (ref 12.0–16.0)
MCH: 32 pg (ref 26.0–34.0)
MCHC: 32.2 g/dL (ref 32.0–36.0)
MCV: 99.5 fL (ref 80.0–100.0)
Platelets: 205 10*3/uL (ref 150–440)
RBC: 3.62 MIL/uL — ABNORMAL LOW (ref 3.80–5.20)
RDW: 16.4 % — ABNORMAL HIGH (ref 11.5–14.5)
WBC: 11.6 10*3/uL — ABNORMAL HIGH (ref 3.6–11.0)

## 2015-11-08 LAB — IRON AND TIBC
Iron: 155 ug/dL (ref 28–170)
Saturation Ratios: 52 % — ABNORMAL HIGH (ref 10.4–31.8)
TIBC: 297 ug/dL (ref 250–450)
UIBC: 142 ug/dL

## 2015-11-08 LAB — SALICYLATE LEVEL: Salicylate Lvl: <4 mg/dL (ref 2.8–30.0)

## 2015-11-08 LAB — TROPONIN I: Troponin I: 0.07 ng/mL — ABNORMAL HIGH (ref <0.031)

## 2015-11-08 LAB — ACETAMINOPHEN LEVEL: Acetaminophen (Tylenol), Serum: <10 ug/mL — ABNORMAL LOW (ref 10–30)

## 2015-11-08 LAB — ETHANOL: Alcohol, Ethyl (B): <5 mg/dL (ref <5)

## 2015-11-08 MED ORDER — SODIUM CHLORIDE 0.9 % IV BOLUS (SEPSIS)
1000.0000 mL | Freq: Once | INTRAVENOUS | Status: AC
  Administered 2015-11-08: 1000 mL via INTRAVENOUS

## 2015-11-08 MED ORDER — DEXTROSE 5 % IV SOLN
15.0000 mg/kg/h | INTRAVENOUS | Status: DC
  Administered 2015-11-09: 15 mg/kg/h via INTRAVENOUS
  Filled 2015-11-08 (×3): qty 200

## 2015-11-08 MED ORDER — ACETYLCYSTEINE LOAD VIA INFUSION
150.0000 mg/kg | Freq: Once | INTRAVENOUS | Status: AC
  Administered 2015-11-08: 18105 mg via INTRAVENOUS
  Filled 2015-11-08: qty 453

## 2015-11-08 NOTE — H&P (Signed)
[facility] [place] NAME: Jamie Bennett    MR#:  [id number]  DATE OF BIRTH:  [birth year]  DATE OF ADMISSION:  11/08/2015  PRIMARY CARE PHYSICIAN: Jamie Guise, MD   REQUESTING/REFERRING PHYSICIAN: Archie Bennett, M.D.  CHIEF COMPLAINT:   Chief Complaint  Patient presents with  . Drug Overdose    HISTORY OF PRESENT ILLNESS:  Jamie Bennett  is a 55 y.o. female who presents with altered mental status. Patient is unable to contribute to her history, and collateral information is taken from her husband who is present at bedside. He states that she has bipolar, with depressive episodes. She has a prescription for Provigil, and he feels that she likely can overdose of this medication. She has done this before. He states that he controls her medications and typically keeps that all locked up, but that she sometimes will still pick it up from the pharmacy. He thinks that this time she picked up the medicine from the pharmacy and replaced at all with Tylenol tablets so that he would not know that she had the Provigil. Over the last 4-5 days she took majority of her monthly prescription. He states that typically she will initially have energy boost and not feel depressed, and he feels that this is a reason that she takes more the medicine than prescribed. However, after couple of days she seems to likely take more and more, and then will have adverse side effects from it. On evaluation in the ED today she is responsive, but not in any coherent way. She is also found to have acute kidney injury, as well as significantly elevated liver enzymes. Her blood pressure is also trending down. Hospitalists were called for admission.  PAST MEDICAL HISTORY:   Past Medical History  Diagnosis Date  . Osteoarthritis   . Osteoporosis   . Rheumatoid arthritis ([place])   . Thyroid disease   . Hypotension   . Constipation   . Anemia   . Bipolar 1 disorder ([place])    . Anxiety   . Fibromyalgia     PAST SURGICAL HISTORY:   Past Surgical History  Procedure Laterality Date  . Gastric bypass    . Cholecystectomy      SOCIAL HISTORY:   Social History  Substance Use Topics  . Smoking status: Never Smoker   . Smokeless tobacco: Not on file  . Alcohol Use: No    FAMILY HISTORY:   Family History  Problem Relation Age of Onset  . Depression Mother   . Dementia Mother   . Heart disease Mother   . Parkinson's disease Father     DRUG ALLERGIES:  No Known Allergies  MEDICATIONS AT HOME:   Prior to Admission medications   Medication Sig Start Date End Date Taking? Authorizing Provider  ALPRAZolam Duanne Moron) 1 MG tablet Take 1 tablet (1 mg total) by mouth 4 (four) times daily as needed. Patient taking differently: Take 1 mg by mouth 4 (four) times daily as needed for anxiety.  08/04/15  Yes Marjie Skiff, MD  Biotin 5000 MCG TABS Take 5,000 mcg by mouth daily.    Yes Historical Provider, MD  busPIRone (BUSPAR) 10 MG tablet Take 20 mg by mouth 4 (four) times daily.   Yes Historical Provider, MD  Cholecalciferol (HM VITAMIN D3) 4000 units CAPS Take 4,000 capsules by mouth daily.   Yes Historical Provider, MD  cyanocobalamin (,VITAMIN B-12,) 1000 MCG/ML injection Inject 1,000 mcg into the muscle  every 30 (thirty) days.   Yes Historical Provider, MD  esomeprazole (NEXIUM) 40 MG capsule Take 1 capsule (40 mg total) by mouth daily at 12 noon. 03/04/14  Yes Benjamine Mola, FNP  Fiber, Guar Gum, CHEW Chew 10 mg by mouth daily.   Yes Historical Provider, MD  hydroxychloroquine (PLAQUENIL) 200 MG tablet Take 200 mg by mouth See admin instructions. 200 mg twice a day Mon-Friday only   Yes Historical Provider, MD  lamoTRIgine (LAMICTAL) 150 MG tablet Take 150 tablets by mouth 2 (two) times daily.   Yes Historical Provider, MD  levothyroxine (SYNTHROID, LEVOTHROID) 112 MCG tablet Take 112 mcg by mouth daily. 07/06/15  Yes Historical Provider, MD  modafinil  (PROVIGIL) 200 MG tablet Take 1-2 tablets (200-400 mg total) by mouth 2 (two) times daily. 400 mg in the am and 200 mg at noon 03/04/14  Yes Benjamine Mola, FNP  Multiple Vitamin (MULTIVITAMIN WITH MINERALS) TABS tablet Take 1 tablet by mouth daily.   Yes Historical Provider, MD  tiZANidine (ZANAFLEX) 4 MG capsule Take 4 mg by mouth 2 (two) times daily as needed for muscle spasms.    Yes Historical Provider, MD  traZODone (DESYREL) 50 MG tablet Take 1- 4 tablets nightly if needed for insomnia 08/17/15  Yes Marjie Skiff, MD  venlafaxine (EFFEXOR) 100 MG tablet Take 100 mg by mouth 3 (three) times daily.    Yes Historical Provider, MD  zoledronic acid (RECLAST) 5 MG/100ML SOLN injection Inject 5 mg into the vein See admin instructions. Patient takes yearly   Yes Historical Provider, MD    REVIEW OF SYSTEMS:  Review of Systems  Unable to perform ROS: acuity of condition     VITAL SIGNS:   Filed Vitals:   11/08/15 2245 11/08/15 2300 11/08/15 2330 11/08/15 2345  BP: 91/55 86/56 90/44  92/55  Pulse: 67 66 65 65  Temp:      TempSrc:      Resp: 25 30 27 25   Height:      Weight:      SpO2: 100% 98% 98% 99%   Wt Readings from Last 3 Encounters:  11/08/15 120.657 kg (266 lb)  09/13/15 121.65 kg (268 lb 3 oz)  08/25/15 119.115 kg (262 lb 9.6 oz)    PHYSICAL EXAMINATION:  Physical Exam  Vitals reviewed. Constitutional: She appears well-developed and well-nourished. No distress.  HENT:  Head: Normocephalic and atraumatic.  Mouth/Throat: Oropharynx is clear and moist.  Eyes: Conjunctivae and EOM are normal. Pupils are equal, round, and reactive to light. No scleral icterus.  Neck: Normal range of motion. Neck supple. No JVD present. No thyromegaly present.  Cardiovascular: Normal rate, regular rhythm and intact distal pulses.  Exam reveals no gallop and no friction rub.   No murmur heard. Respiratory: Effort normal and breath sounds normal. No respiratory distress. She has no wheezes. She  has no rales.  GI: Soft. Bowel sounds are normal. She exhibits no distension. There is no tenderness.  Musculoskeletal: Normal range of motion. She exhibits no edema.  No arthritis, no gout  Lymphadenopathy:    She has no cervical adenopathy.  Neurological: No cranial nerve deficit.  Patient is lethargic, though not somnolent. She is not following any commands.  Skin: Skin is warm and dry. No rash noted. No erythema.  Psychiatric:  Unable to assess due to the patient's condition    LABORATORY PANEL:   CBC  Recent Labs Lab 11/08/15 2136  WBC 11.6*  HGB 11.6*  HCT  36.0  PLT 205   ------------------------------------------------------------------------------------------------------------------  Chemistries   Recent Labs Lab 11/08/15 2136  NA 135  K 5.4*  CL 108  CO2 8*  GLUCOSE 79  BUN 54*  CREATININE 2.70*  CALCIUM 8.0*  AST 1804*  ALT 660*  ALKPHOS 140*  BILITOT 1.3*   ------------------------------------------------------------------------------------------------------------------  Cardiac Enzymes No results for input(s): TROPONINI in the last 168 hours. ------------------------------------------------------------------------------------------------------------------  RADIOLOGY:  Ct Head Wo Contrast  11/08/2015  CLINICAL DATA:  Took 218 Provigil tablets over 5 days. Lethargy, with slurred speech. Initial encounter. EXAM: CT HEAD WITHOUT CONTRAST TECHNIQUE: Contiguous axial images were obtained from the base of the skull through the vertex without intravenous contrast. COMPARISON:  CT of the head performed 05/07/2013, and MRI of the brain performed 08/17/2013 FINDINGS: There is no evidence of acute infarction, mass lesion, or intra- or extra-axial hemorrhage on CT. The posterior fossa, including the cerebellum, brainstem and fourth ventricle, is within normal limits. The third and lateral ventricles, and basal ganglia are unremarkable in appearance. The cerebral  hemispheres are symmetric in appearance, with normal gray-white differentiation. No mass effect or midline shift is seen. There is no evidence of fracture; visualized osseous structures are unremarkable in appearance. The visualized portions of the orbits are within normal limits. The paranasal sinuses and mastoid air cells are well-aerated. No significant soft tissue abnormalities are seen. IMPRESSION: Unremarkable noncontrast CT of the head. Electronically Signed   By: Garald Balding M.D.   On: 11/08/2015 23:20    EKG:   Orders placed or performed during the hospital encounter of 11/08/15  . ED EKG  . EKG 12-Lead  . EKG 12-Lead  . ED EKG    IMPRESSION AND PLAN:  Principal Problem:   Acute hepatic failure - on review it seems that this is most likely due to her Provigil overdose. Her urine tox was only positive for benzos. Her salicylate and Tylenol levels were undetectable. Her alcohol level was also undetectable. We will admit her, trend her liver enzymes, and get a gastroenterology consult. Her blood pressure is also trending down, but was more stable when she first arrived and it seems unlikely that her transaminase elevation is due to hypotension or shock liver at this point. Avoid hepatotoxins. Active Problems:   Hypotension - received sufficient fluid resuscitation in the ED, with blood pressure trending down even while fluids running. We will admit her to the ICU, and order levo fed drip for blood pressure support. Consult the intensivist   Drug overdose - almost certainly Provigil overdose, patient actually admitted to this when she was still more coherent and told her husband which she had done switching the Provigil for Tylenol in the prescription bottle in order to keep the Provigil herself and take it. Half-life is 15 hours, so will likely take several days her to completely metabolized out of her system. She was started on NAC in the ED.   AKI (acute kidney injury) ([place]) - likely due  to her overdose some low blood pressures. We'll support her blood pressure is above, and continue fluids and monitor closely. Avoid nephrotoxins.   Bipolar 1 disorder, depressed ([place]) - we are holding her psychiatric medications at this time, patient is nothing by mouth anyway. She will need a psychiatry consult when she is more alert and able to communicate.   Rheumatoid arteritis - she is on Plaquenil for this, however this medication is cleared hepatically so we will hold at this time.   Elevated troponin -  mildly elevated, EKG nonischemic per my read. we will trend his enzymes serially tonight and monitor on telemetry.   Hypothyroidism - the patient is able take by mouth she will need her home dose of thyroid replacement  All the records are reviewed and case discussed with ED provider. Management plans discussed with the patient and/or family.  DVT PROPHYLAXIS: SubQ heparin  GI PROPHYLAXIS: None  ADMISSION STATUS: Inpatient  CODE STATUS: Full Code Status History    Date Active Date Inactive Code Status Order ID Comments User Context   02/26/2014  5:27 PM 03/04/2014  6:39 PM Full Code [id number]  Clarene Reamer, MD Inpatient   02/26/2014 11:58 AM 02/26/2014  5:27 PM Full Code [id number]  Evelina Bucy, MD ED      TOTAL CRITICAL CARE TIME TAKING CARE OF THIS PATIENT: 50 minutes.    Tramane Gorum FIELDING 11/08/2015, 11:58 PM  [facility]  Office  [phone]  CC: Primary care physician; Jamie Guise, MD

## 2015-11-08 NOTE — ED Notes (Signed)
Pt to rm 17 via EMS from home.  EMS reports pt took 218 200mg  provigil tablets over 5 days.  Pt unable to state why she took so many.  Per EMS, per husband, appears to be more misuse rather than SI.  EMS reports pt has done this in the past and was admitted for observation.  Pt hx of bipolar and depression.  Pt lethargic, slurring speech, not answering all questions, but A&O x 4, PERRL with pupils 31mm.  Pt NAD.

## 2015-11-08 NOTE — ED Provider Notes (Signed)
[facility] Emergency Department Provider Note   ____________________________________________  Time seen: ~2150  I have reviewed the triage vital signs and the nursing notes.   HISTORY  Chief Complaint Drug Overdose   History limited by: Altered Mental Status, history obtained from husband   HPI Jamie Bennett is a 55 y.o. female who presents to the emergency department today via EMS because of altered mental status. The husband states that the patient has likely been overdosing on her Provigil tablets. He states that she has done this in the past. He states that she switched out the Provigil tablets 2 Tylenol tablets. Thus he was not aware that she had controlled the Provigil tablets he is the one that does typically have control of her medications. He states that she has done this in the past for she will overdose on the Provigil. He states he does this because she likes the way it makes her feel. He states that for the past couple days she has been somewhat manic until today when she started becoming more confused and unresponsive.    Past Medical History  Diagnosis Date  . Osteoarthritis   . Osteoporosis   . Rheumatoid arthritis ([place])   . Thyroid disease   . Hypotension   . Constipation   . Anemia   . Bipolar 1 disorder ([place])   . Anxiety   . Fibromyalgia     Patient Active Problem List   Diagnosis Date Noted  . Fatty infiltration of liver 02/16/2015  . Hepatic fibrosis ([place]) 02/16/2015  . Abnormal serum level of alkaline phosphatase 02/15/2015  . Anemia 02/05/2015  . Vitamin B 12 deficiency 02/05/2015  . OP (osteoporosis) 06/16/2014  . Rheumatoid arteritis 03/02/2014  . Osteoarthritis of left hip 03/02/2014  . Unspecified hypothyroidism 03/02/2014  . Rheumatic fever without heart involvement 03/02/2014  . Adult hypothyroidism 03/02/2014  . Arthritis of pelvic region, degenerative 03/02/2014  . Bipolar 1 disorder, depressed ([place])  02/26/2014  . Bariatric surgery status 11/24/2013  . Affective bipolar disorder ([place]) 11/24/2013  . Bipolar affective disorder ([place]) 11/24/2013  . Rheumatoid arthritis ([place]) 11/24/2013  . Polysubstance (excluding opioids) dependence ([place]) 09/11/2013  . Polysubstance dependence ([place]) 09/11/2013  . Combined drug dependence excluding opioids ([place]) 09/11/2013  . Arthritis or polyarthritis, rheumatoid ([place]) 09/05/2013  . Leg weakness 09/05/2013    Past Surgical History  Procedure Laterality Date  . Gastric bypass    . Cholecystectomy      Current Outpatient Rx  Name  Route  Sig  Dispense  Refill  . ALPRAZolam (XANAX) 1 MG tablet   Oral   Take 1 tablet (1 mg total) by mouth 4 (four) times daily as needed.   360 tablet   0   . benztropine (COGENTIN) 1 MG tablet   Oral   Take 1 mg by mouth 2 (two) times daily. 1 mg in the morning and 2 mg at night         . Biotin 5000 MCG TABS   Oral   Take by mouth.         . busPIRone (BUSPAR) 10 MG tablet      Take 15 mg (one and half tablet) twice daily for one week, then increase to 20 mg  (two tablets) twice daily.   120 tablet   4   . Cholecalciferol (HM VITAMIN D3) 4000 units CAPS   Oral   Take 4,000 capsules by mouth daily.         Marland Kitchen  cyanocobalamin (,VITAMIN B-12,) 1000 MCG/ML injection   Intramuscular   Inject 1,000 mcg into the muscle every 30 (thirty) days.         Marland Kitchen esomeprazole (NEXIUM) 40 MG capsule   Oral   Take 1 capsule (40 mg total) by mouth daily at 12 noon.         . hydroxychloroquine (PLAQUENIL) 200 MG tablet   Oral   Take 200 mg by mouth daily. 200 mg twice a day Mon-Friday only         . lamoTRIgine (LAMICTAL) 150 MG tablet   Oral   Take 150 tablets by mouth 2 (two) times daily.         Marland Kitchen levothyroxine (SYNTHROID, LEVOTHROID) 112 MCG tablet   Oral   Take 112 mcg by mouth daily.         . modafinil (PROVIGIL) 200 MG tablet   Oral   Take 1-2 tablets (200-400 mg total) by mouth 2 (two)  times daily. 400 mg in the am and 200 mg at noon         . Multiple Vitamin (MULTI-VITAMINS) TABS   Oral   Take by mouth.         Marland Kitchen tiZANidine (ZANAFLEX) 4 MG capsule   Oral   Take 4 mg by mouth 2 (two) times daily.         . traZODone (DESYREL) 50 MG tablet      Take 1- 4 tablets nightly if needed for insomnia   360 tablet   0   . venlafaxine (EFFEXOR) 100 MG tablet   Oral   Take 100 mg by mouth 3 (three) times daily.          . zoledronic acid (RECLAST) 5 MG/100ML SOLN injection   Intravenous   Inject into the vein.           Allergies No known allergies  Family History  Problem Relation Age of Onset  . Depression Mother   . Dementia Mother   . Heart disease Mother   . Parkinson's disease Father     Social History Social History  Substance Use Topics  . Smoking status: Never Smoker   . Smokeless tobacco: None  . Alcohol Use: No    Review of Systems Unable to obtain secondary to altered mental status ____________________________________________   PHYSICAL EXAM:  VITAL SIGNS: ED Triage Vitals  Enc Vitals Group     BP 11/08/15 2124 86/58 mmHg     Pulse Rate 11/08/15 2124 71     Resp 11/08/15 2124 23     Temp 11/08/15 2124 97.4 F (36.3 C)     Temp Source 11/08/15 2124 Oral     SpO2 11/08/15 2124 99 %     Weight 11/08/15 2124 266 lb (120.657 kg)     Height 11/08/15 2124 5\' 6"  (1.676 m)     Head Cir --      Peak Flow --      Pain Score 11/08/15 2127 0   Constitutional: Somewhat somnolent, not oriented. Eyes: Conjunctivae are normal. PERRL. Normal extraocular movements. ENT   Head: Normocephalic and atraumatic.   Nose: No congestion/rhinnorhea.   Mouth/Throat: Mucous membranes are moist.   Neck: No stridor. Hematological/Lymphatic/Immunilogical: No cervical lymphadenopathy. Cardiovascular: Normal rate, regular rhythm.  No murmurs, rubs, or gallops. Respiratory: Normal respiratory effort without tachypnea nor retractions.  Breath sounds are clear and equal bilaterally. No wheezes/rales/rhonchi. Gastrointestinal: Soft and nontender. No distention.  Genitourinary: Deferred Musculoskeletal: Normal range  of motion in all extremities. No joint effusions.  No lower extremity tenderness nor edema. Neurologic:  Somnolent. Will wake up and follow commands. Mutters single words. Able to move all extremities. Skin:  Skin is warm, dry and intact. No rash noted.  ____________________________________________    LABS (pertinent positives/negatives)  Labs Reviewed  COMPREHENSIVE METABOLIC PANEL - Abnormal; Notable for the following:    Potassium 5.4 (*)    CO2 8 (*)    BUN 54 (*)    Creatinine, Ser 2.70 (*)    Calcium 8.0 (*)    AST 1804 (*)    ALT 660 (*)    Alkaline Phosphatase 140 (*)    Total Bilirubin 1.3 (*)    GFR calc non Af Amer 19 (*)    GFR calc Af Amer 22 (*)    Anion gap 19 (*)    All other components within normal limits  ACETAMINOPHEN LEVEL - Abnormal; Notable for the following:    Acetaminophen (Tylenol), Serum <10 (*)    All other components within normal limits  CBC - Abnormal; Notable for the following:    WBC 11.6 (*)    RBC 3.62 (*)    Hemoglobin 11.6 (*)    RDW 16.4 (*)    All other components within normal limits  URINE DRUG SCREEN, QUALITATIVE ([facility] ONLY) - Abnormal; Notable for the following:    Benzodiazepine, Ur Scrn POSITIVE (*)    All other components within normal limits  ETHANOL  SALICYLATE LEVEL  IRON AND TIBC  TROPONIN I  CBG MONITORING, ED    ____________________________________________   EKG  I, Nance Pear, attending physician, personally viewed and interpreted this EKG  EKG Time: 2127 Rate: 70 Rhythm: accelerated junctional rhythm Axis: normal Intervals: qtc 521 QRS: intraventricular conduction delay ST changes: no st elevation Impression: abnormal ekg  ____________________________________________    RADIOLOGY  CT  head IMPRESSION: Unremarkable noncontrast CT of the head.  ____________________________________________   PROCEDURES  Procedure(s) performed: None  Critical Care performed: Yes, see critical care note(s)  CRITICAL CARE Performed by: Nance Pear   Total critical care time: 40 minutes  Critical care time was exclusive of separately billable procedures and treating other patients.  Critical care was necessary to treat or prevent imminent or life-threatening deterioration.  Critical care was time spent personally by me on the following activities: development of treatment plan with patient and/or surrogate as well as nursing, discussions with consultants, evaluation of patient's response to treatment, examination of patient, obtaining history from patient or surrogate, ordering and performing treatments and interventions, ordering and review of laboratory studies, ordering and review of radiographic studies, pulse oximetry and re-evaluation of patient's condition.  ____________________________________________   INITIAL IMPRESSION / ASSESSMENT AND PLAN / ED COURSE  Pertinent labs & imaging results that were available during my care of the patient were reviewed by me and considered in my medical decision making (see chart for details).  Patient presented to the emergency department today via EMS because of concerns for altered mental status and possible Provigil overdose. On exam patient is somewhat somnolent however will follow commands. Patient was hypotensive. Patient was given multiple IV fluid boluses. [place] was contacted given concern for Provigil overdose. The patient's blood work was notable for elevation of the LFTs. Because of this NAC was ordered. Head CT without any concerning findings. Tylenol negative. At  This point think symptoms likely due to drug overdose. Will admit to hospitalist service.  ____________________________________________   FINAL  CLINICAL IMPRESSION(S) / ED DIAGNOSES  Final diagnoses:  Transaminitis  Altered mental status, unspecified altered mental status type  Overdose, undetermined intent, initial encounter     Nance Pear, MD 11/08/15 2352

## 2015-11-08 NOTE — ED Notes (Signed)
Spoke with Poison Control who states that patient is presenting as if she took Effexor instead of Provigil.COnfirmed with husband that it was indeed Provigil that she took. Poison control recommends checking CPK, LFT's, Troponin, iron level due to infusions she gets, and monitor her. If she should have ventricular arrhythmias that she should call them back to let them know. Monitor symptomatically and give fluid bolus for hypotension. MD notified of same.

## 2015-11-08 NOTE — ED Notes (Signed)
Poison control called by charge nurse

## 2015-11-09 ENCOUNTER — Encounter: Payer: Self-pay | Admitting: Pulmonary Disease

## 2015-11-09 ENCOUNTER — Ambulatory Visit: Payer: Medicare HMO | Admitting: Psychiatry

## 2015-11-09 ENCOUNTER — Inpatient Hospital Stay: Payer: Medicare HMO

## 2015-11-09 ENCOUNTER — Telehealth: Payer: Self-pay | Admitting: Internal Medicine

## 2015-11-09 DIAGNOSIS — R778 Other specified abnormalities of plasma proteins: Secondary | ICD-10-CM | POA: Diagnosis present

## 2015-11-09 DIAGNOSIS — J9600 Acute respiratory failure, unspecified whether with hypoxia or hypercapnia: Secondary | ICD-10-CM

## 2015-11-09 DIAGNOSIS — J9690 Respiratory failure, unspecified, unspecified whether with hypoxia or hypercapnia: Secondary | ICD-10-CM

## 2015-11-09 DIAGNOSIS — R7989 Other specified abnormal findings of blood chemistry: Secondary | ICD-10-CM

## 2015-11-09 DIAGNOSIS — N179 Acute kidney failure, unspecified: Secondary | ICD-10-CM

## 2015-11-09 DIAGNOSIS — K7200 Acute and subacute hepatic failure without coma: Secondary | ICD-10-CM

## 2015-11-09 DIAGNOSIS — G9341 Metabolic encephalopathy: Secondary | ICD-10-CM

## 2015-11-09 DIAGNOSIS — T50904S Poisoning by unspecified drugs, medicaments and biological substances, undetermined, sequela: Secondary | ICD-10-CM

## 2015-11-09 DIAGNOSIS — E872 Acidosis: Secondary | ICD-10-CM

## 2015-11-09 DIAGNOSIS — I959 Hypotension, unspecified: Secondary | ICD-10-CM | POA: Diagnosis present

## 2015-11-09 LAB — HEPATIC FUNCTION PANEL
ALT: 1146 U/L — ABNORMAL HIGH (ref 14–54)
ALT: 967 U/L — ABNORMAL HIGH (ref 14–54)
AST: >2275 U/L — ABNORMAL HIGH (ref 15–41)
AST: >2275 U/L — ABNORMAL HIGH (ref 15–41)
Albumin: 2.9 g/dL — ABNORMAL LOW (ref 3.5–5.0)
Albumin: 2.7 g/dL — ABNORMAL LOW (ref 3.5–5.0)
Alkaline Phosphatase: 132 U/L — ABNORMAL HIGH (ref 38–126)
Alkaline Phosphatase: 136 U/L — ABNORMAL HIGH (ref 38–126)
Bilirubin, Direct: 0.1 mg/dL (ref 0.1–0.5)
Bilirubin, Direct: 0.4 mg/dL (ref 0.1–0.5)
Indirect Bilirubin: 1.2 mg/dL — ABNORMAL HIGH (ref 0.3–0.9)
Indirect Bilirubin: 1.6 mg/dL — ABNORMAL HIGH (ref 0.3–0.9)
Total Bilirubin: 1.3 mg/dL — ABNORMAL HIGH (ref 0.3–1.2)
Total Bilirubin: 2 mg/dL — ABNORMAL HIGH (ref 0.3–1.2)
Total Protein: 6.1 g/dL — ABNORMAL LOW (ref 6.5–8.1)
Total Protein: 5.2 g/dL — ABNORMAL LOW (ref 6.5–8.1)

## 2015-11-09 LAB — BASIC METABOLIC PANEL
Anion gap: 20 — ABNORMAL HIGH (ref 5–15)
Anion gap: 20 — ABNORMAL HIGH (ref 5–15)
Anion gap: 22 — ABNORMAL HIGH (ref 5–15)
BUN: 63 mg/dL — ABNORMAL HIGH (ref 6–20)
BUN: 28 mg/dL — ABNORMAL HIGH (ref 6–20)
BUN: 33 mg/dL — ABNORMAL HIGH (ref 6–20)
CO2: 11 mmol/L — ABNORMAL LOW (ref 22–32)
CO2: 18 mmol/L — ABNORMAL LOW (ref 22–32)
CO2: 15 mmol/L — ABNORMAL LOW (ref 22–32)
Calcium: 6.6 mg/dL — ABNORMAL LOW (ref 8.9–10.3)
Calcium: 7.3 mg/dL — ABNORMAL LOW (ref 8.9–10.3)
Calcium: 7.1 mg/dL — ABNORMAL LOW (ref 8.9–10.3)
Chloride: 106 mmol/L (ref 101–111)
Chloride: 101 mmol/L (ref 101–111)
Chloride: 100 mmol/L — ABNORMAL LOW (ref 101–111)
Creatinine, Ser: 3.1 mg/dL — ABNORMAL HIGH (ref 0.44–1.00)
Creatinine, Ser: 1.88 mg/dL — ABNORMAL HIGH (ref 0.44–1.00)
Creatinine, Ser: 2.17 mg/dL — ABNORMAL HIGH (ref 0.44–1.00)
GFR calc Af Amer: 18 mL/min — ABNORMAL LOW (ref >60)
GFR calc Af Amer: 34 mL/min — ABNORMAL LOW (ref >60)
GFR calc Af Amer: 28 mL/min — ABNORMAL LOW (ref >60)
GFR calc non Af Amer: 16 mL/min — ABNORMAL LOW (ref >60)
GFR calc non Af Amer: 29 mL/min — ABNORMAL LOW (ref >60)
GFR calc non Af Amer: 24 mL/min — ABNORMAL LOW (ref >60)
Glucose, Bld: 120 mg/dL — ABNORMAL HIGH (ref 65–99)
Glucose, Bld: 67 mg/dL (ref 65–99)
Glucose, Bld: 76 mg/dL (ref 65–99)
Potassium: 4.6 mmol/L (ref 3.5–5.1)
Potassium: 2.9 mmol/L — CL (ref 3.5–5.1)
Potassium: 2.9 mmol/L — CL (ref 3.5–5.1)
Sodium: 137 mmol/L (ref 135–145)
Sodium: 139 mmol/L (ref 135–145)
Sodium: 137 mmol/L (ref 135–145)

## 2015-11-09 LAB — COMPREHENSIVE METABOLIC PANEL
ALT: 623 U/L — ABNORMAL HIGH (ref 14–54)
ALT: 882 U/L — ABNORMAL HIGH (ref 14–54)
AST: 2132 U/L — ABNORMAL HIGH (ref 15–41)
AST: >2275 U/L — ABNORMAL HIGH (ref 15–41)
Albumin: 2.9 g/dL — ABNORMAL LOW (ref 3.5–5.0)
Albumin: 3 g/dL — ABNORMAL LOW (ref 3.5–5.0)
Alkaline Phosphatase: 130 U/L — ABNORMAL HIGH (ref 38–126)
Alkaline Phosphatase: 135 U/L — ABNORMAL HIGH (ref 38–126)
Anion gap: 16 — ABNORMAL HIGH (ref 5–15)
Anion gap: 19 — ABNORMAL HIGH (ref 5–15)
BUN: 54 mg/dL — ABNORMAL HIGH (ref 6–20)
BUN: 60 mg/dL — ABNORMAL HIGH (ref 6–20)
CO2: 9 mmol/L — ABNORMAL LOW (ref 22–32)
CO2: 8 mmol/L — ABNORMAL LOW (ref 22–32)
Calcium: 6.5 mg/dL — ABNORMAL LOW (ref 8.9–10.3)
Calcium: 6.4 mg/dL — CL (ref 8.9–10.3)
Chloride: 110 mmol/L (ref 101–111)
Chloride: 107 mmol/L (ref 101–111)
Creatinine, Ser: 2.62 mg/dL — ABNORMAL HIGH (ref 0.44–1.00)
Creatinine, Ser: 2.95 mg/dL — ABNORMAL HIGH (ref 0.44–1.00)
GFR calc Af Amer: 23 mL/min — ABNORMAL LOW (ref >60)
GFR calc Af Amer: 20 mL/min — ABNORMAL LOW (ref >60)
GFR calc non Af Amer: 19 mL/min — ABNORMAL LOW (ref >60)
GFR calc non Af Amer: 17 mL/min — ABNORMAL LOW (ref >60)
Glucose, Bld: 133 mg/dL — ABNORMAL HIGH (ref 65–99)
Glucose, Bld: 136 mg/dL — ABNORMAL HIGH (ref 65–99)
Potassium: 4.5 mmol/L (ref 3.5–5.1)
Potassium: 4.6 mmol/L (ref 3.5–5.1)
Sodium: 135 mmol/L (ref 135–145)
Sodium: 134 mmol/L — ABNORMAL LOW (ref 135–145)
Total Bilirubin: 0.5 mg/dL (ref 0.3–1.2)
Total Bilirubin: 1.3 mg/dL — ABNORMAL HIGH (ref 0.3–1.2)
Total Protein: 5.7 g/dL — ABNORMAL LOW (ref 6.5–8.1)
Total Protein: 6 g/dL — ABNORMAL LOW (ref 6.5–8.1)

## 2015-11-09 LAB — BLOOD GAS, ARTERIAL
Acid-Base Excess: 0.6 mmol/L (ref 0.0–3.0)
Acid-base deficit: 23.3 mmol/L — ABNORMAL HIGH (ref 0.0–2.0)
Acid-base deficit: 24.4 mmol/L — ABNORMAL HIGH (ref 0.0–2.0)
Acid-base deficit: 22.7 mmol/L — ABNORMAL HIGH (ref 0.0–2.0)
Allens test (pass/fail): POSITIVE — AB
Allens test (pass/fail): POSITIVE — AB
Allens test (pass/fail): POSITIVE — AB
Allens test (pass/fail): POSITIVE — AB
Bicarbonate: 5.5 mEq/L — ABNORMAL LOW (ref 21.0–28.0)
Bicarbonate: 6.9 mEq/L — ABNORMAL LOW (ref 21.0–28.0)
Bicarbonate: 7.7 mEq/L — ABNORMAL LOW (ref 21.0–28.0)
Bicarbonate: 22.2 mEq/L (ref 21.0–28.0)
FIO2: 0.21
FIO2: 0.4
FIO2: 0.45
FIO2: 40
MECHVT: 500 mL
MECHVT: 500 mL
Mechanical Rate: 24
Mechanical Rate: 30
O2 Saturation: 92.7 %
O2 Saturation: 73 %
O2 Saturation: 97.1 %
O2 Saturation: 99.8 %
PEEP: 5 cmH2O
PEEP: 5 cmH2O
PEEP: 5 cmH2O
Patient temperature: 37
Patient temperature: 37
Patient temperature: 37
Patient temperature: 37
Pressure control: 20 cmH2O
RATE: 24 resp/min
RATE: 30 resp/min
RATE: 24 resp/min
pCO2 arterial: 20 mmHg — CL (ref 32.0–48.0)
pCO2 arterial: 32 mmHg (ref 32.0–48.0)
pCO2 arterial: 32 mmHg (ref 32.0–48.0)
pCO2 arterial: 26 mmHg — ABNORMAL LOW (ref 32.0–48.0)
pH, Arterial: 7.05 — CL (ref 7.350–7.450)
pH, Arterial: 6.94 — CL (ref 7.350–7.450)
pH, Arterial: 6.99 — CL (ref 7.350–7.450)
pH, Arterial: 7.54 — ABNORMAL HIGH (ref 7.350–7.450)
pO2, Arterial: 94 mmHg (ref 83.0–108.0)
pO2, Arterial: 64 mmHg — ABNORMAL LOW (ref 83.0–108.0)
pO2, Arterial: 134 mmHg — ABNORMAL HIGH (ref 83.0–108.0)
pO2, Arterial: 204 mmHg — ABNORMAL HIGH (ref 83.0–108.0)

## 2015-11-09 LAB — ACETAMINOPHEN LEVEL: Acetaminophen (Tylenol), Serum: <10 ug/mL — ABNORMAL LOW (ref 10–30)

## 2015-11-09 LAB — MAGNESIUM
Magnesium: 2.1 mg/dL (ref 1.7–2.4)
Magnesium: 1.9 mg/dL (ref 1.7–2.4)

## 2015-11-09 LAB — CBC
HCT: 33.8 % — ABNORMAL LOW (ref 35.0–47.0)
HCT: 34.4 % — ABNORMAL LOW (ref 35.0–47.0)
Hemoglobin: 10.8 g/dL — ABNORMAL LOW (ref 12.0–16.0)
Hemoglobin: 11.3 g/dL — ABNORMAL LOW (ref 12.0–16.0)
MCH: 32 pg (ref 26.0–34.0)
MCH: 32.2 pg (ref 26.0–34.0)
MCHC: 31.9 g/dL — ABNORMAL LOW (ref 32.0–36.0)
MCHC: 32.9 g/dL (ref 32.0–36.0)
MCV: 100.4 fL — ABNORMAL HIGH (ref 80.0–100.0)
MCV: 97.9 fL (ref 80.0–100.0)
Platelets: 186 10*3/uL (ref 150–440)
Platelets: 196 10*3/uL (ref 150–440)
RBC: 3.36 MIL/uL — ABNORMAL LOW (ref 3.80–5.20)
RBC: 3.51 MIL/uL — ABNORMAL LOW (ref 3.80–5.20)
RDW: 16.9 % — ABNORMAL HIGH (ref 11.5–14.5)
RDW: 16.6 % — ABNORMAL HIGH (ref 11.5–14.5)
WBC: 10.8 10*3/uL (ref 3.6–11.0)
WBC: 10.1 10*3/uL (ref 3.6–11.0)

## 2015-11-09 LAB — PROTIME-INR
INR: 1.49
Prothrombin Time: 18.1 seconds — ABNORMAL HIGH (ref 11.4–15.0)

## 2015-11-09 LAB — PHOSPHORUS
Phosphorus: 6.6 mg/dL — ABNORMAL HIGH (ref 2.5–4.6)
Phosphorus: 5.4 mg/dL — ABNORMAL HIGH (ref 2.5–4.6)
Phosphorus: 1.9 mg/dL — ABNORMAL LOW (ref 2.5–4.6)

## 2015-11-09 LAB — GLUCOSE, CAPILLARY: Glucose-Capillary: 106 mg/dL — ABNORMAL HIGH (ref 65–99)

## 2015-11-09 LAB — LACTIC ACID, PLASMA
Lactic Acid, Venous: 1.6 mmol/L (ref 0.5–2.0)
Lactic Acid, Venous: 1.4 mmol/L (ref 0.5–2.0)
Lactic Acid, Venous: 1.1 mmol/L (ref 0.5–2.0)

## 2015-11-09 LAB — TRIGLYCERIDES: Triglycerides: 176 mg/dL — ABNORMAL HIGH (ref <150)

## 2015-11-09 LAB — MRSA PCR SCREENING: MRSA by PCR: NEGATIVE

## 2015-11-09 LAB — CORTISOL: Cortisol, Plasma: 98.4 ug/dL

## 2015-11-09 LAB — AMMONIA: Ammonia: 161 umol/L — ABNORMAL HIGH (ref 9–35)

## 2015-11-09 LAB — TROPONIN I: Troponin I: 0.09 ng/mL — ABNORMAL HIGH (ref <0.031)

## 2015-11-09 LAB — OSMOLALITY: Osmolality: 326 mOsm/kg (ref 275–295)

## 2015-11-09 LAB — APTT: aPTT: 29 seconds (ref 24–36)

## 2015-11-09 MED ORDER — ANTISEPTIC ORAL RINSE SOLUTION (CORINZ)
7.0000 mL | Freq: Four times a day (QID) | OROMUCOSAL | Status: DC
  Administered 2015-11-09 – 2015-11-10 (×5): 7 mL via OROMUCOSAL
  Filled 2015-11-09 (×9): qty 7

## 2015-11-09 MED ORDER — CETYLPYRIDINIUM CHLORIDE 0.05 % MT LIQD
7.0000 mL | Freq: Two times a day (BID) | OROMUCOSAL | Status: DC
  Administered 2015-11-09 (×2): 7 mL via OROMUCOSAL

## 2015-11-09 MED ORDER — LEVOTHYROXINE SODIUM 100 MCG IV SOLR
56.0000 ug | Freq: Every day | INTRAVENOUS | Status: DC
  Administered 2015-11-09: 56 ug via INTRAVENOUS
  Filled 2015-11-09: qty 5

## 2015-11-09 MED ORDER — MIDAZOLAM HCL 2 MG/2ML IJ SOLN
INTRAMUSCULAR | Status: AC
  Administered 2015-11-09: 2 mg via INTRAVENOUS
  Filled 2015-11-09: qty 2

## 2015-11-09 MED ORDER — PANTOPRAZOLE SODIUM 40 MG IV SOLR
40.0000 mg | Freq: Every day | INTRAVENOUS | Status: DC
  Administered 2015-11-09 (×2): 40 mg via INTRAVENOUS
  Filled 2015-11-09 (×2): qty 40

## 2015-11-09 MED ORDER — SODIUM CHLORIDE 0.9 % IV SOLN: 100.0000 mL | INTRAVENOUS | Status: DC | PRN

## 2015-11-09 MED ORDER — HEPARIN SODIUM (PORCINE) 1000 UNIT/ML DIALYSIS
1000.0000 [IU] | INTRAMUSCULAR | Status: DC | PRN
  Filled 2015-11-09: qty 1

## 2015-11-09 MED ORDER — FOLIC ACID 5 MG/ML IJ SOLN
50.0000 mg | INTRAMUSCULAR | Status: DC
  Administered 2015-11-09 (×3): 50 mg via INTRAVENOUS
  Filled 2015-11-09 (×10): qty 10

## 2015-11-09 MED ORDER — SODIUM CHLORIDE 0.9% FLUSH
10.0000 mL | Freq: Two times a day (BID) | INTRAVENOUS | Status: DC
  Administered 2015-11-09 – 2015-11-11 (×3): 10 mL
  Administered 2015-11-12: 20 mL
  Administered 2015-11-12 – 2015-11-13 (×3): 10 mL
  Administered 2015-11-14: 13 mL
  Administered 2015-11-14 – 2015-11-15 (×2): 10 mL

## 2015-11-09 MED ORDER — PANTOPRAZOLE SODIUM 40 MG PO TBEC: 40.0000 mg | DELAYED_RELEASE_TABLET | Freq: Every day | ORAL | Status: DC

## 2015-11-09 MED ORDER — POTASSIUM CHLORIDE 10 MEQ/100ML IV SOLN
10.0000 meq | INTRAVENOUS | Status: AC
  Administered 2015-11-09 – 2015-11-10 (×4): 10 meq via INTRAVENOUS
  Filled 2015-11-09 (×4): qty 100

## 2015-11-09 MED ORDER — FENTANYL CITRATE (PF) 100 MCG/2ML IJ SOLN
INTRAMUSCULAR | Status: AC
  Administered 2015-11-09: 100 ug via INTRAVENOUS
  Filled 2015-11-09: qty 2

## 2015-11-09 MED ORDER — FOMEPIZOLE 1 GM/ML IV SOLN
15.0000 mg/kg | Freq: Once | INTRAVENOUS | Status: AC
  Administered 2015-11-09: 1840 mg via INTRAVENOUS
  Filled 2015-11-09: qty 1.84

## 2015-11-09 MED ORDER — LEVOTHYROXINE SODIUM 112 MCG PO TABS: 112.0000 ug | ORAL_TABLET | Freq: Every day | ORAL | Status: DC

## 2015-11-09 MED ORDER — PYRIDOXINE HCL 100 MG/ML IJ SOLN
100.0000 mg | Freq: Four times a day (QID) | INTRAMUSCULAR | Status: DC
  Administered 2015-11-09 – 2015-11-11 (×9): 100 mg via INTRAVENOUS
  Filled 2015-11-09 (×13): qty 1

## 2015-11-09 MED ORDER — SODIUM CHLORIDE 0.9 % IV SOLN
INTRAVENOUS | Status: DC
  Administered 2015-11-09: 02:00:00 via INTRAVENOUS

## 2015-11-09 MED ORDER — LIDOCAINE HCL (PF) 1 % IJ SOLN
5.0000 mL | INTRAMUSCULAR | Status: DC | PRN
  Filled 2015-11-09: qty 5

## 2015-11-09 MED ORDER — MIDAZOLAM HCL 2 MG/2ML IJ SOLN
2.0000 mg | INTRAMUSCULAR | Status: AC
  Administered 2015-11-09: 2 mg via INTRAVENOUS

## 2015-11-09 MED ORDER — DEXTROSE 5 % IV SOLN
0.0000 ug/min | INTRAVENOUS | Status: DC
  Administered 2015-11-09: 7 ug/min via INTRAVENOUS
  Administered 2015-11-09: 30 ug/min via INTRAVENOUS
  Administered 2015-11-10: 8 ug/min via INTRAVENOUS
  Filled 2015-11-09 (×3): qty 16

## 2015-11-09 MED ORDER — NOREPINEPHRINE 4 MG/250ML-% IV SOLN
0.0000 ug/min | INTRAVENOUS | Status: DC
Start: 2015-11-09 — End: 2015-11-09
  Administered 2015-11-09: 2 ug/min via INTRAVENOUS
  Filled 2015-11-09: qty 250

## 2015-11-09 MED ORDER — ALTEPLASE 2 MG IJ SOLR
2.0000 mg | Freq: Once | INTRAMUSCULAR | Status: DC | PRN
  Filled 2015-11-09: qty 2

## 2015-11-09 MED ORDER — PROPOFOL 1000 MG/100ML IV EMUL: 0.0000 ug/kg/min | INTRAVENOUS | Status: DC

## 2015-11-09 MED ORDER — SODIUM CHLORIDE 0.9% FLUSH
10.0000 mL | INTRAVENOUS | Status: DC | PRN
Start: 2015-11-09 — End: 2015-11-19

## 2015-11-09 MED ORDER — FENTANYL 2500MCG IN NS 250ML (10MCG/ML) PREMIX INFUSION
0.0000 ug/h | INTRAVENOUS | Status: DC
  Administered 2015-11-09: 250 ug/h via INTRAVENOUS
  Administered 2015-11-09: 50 ug/h via INTRAVENOUS
  Administered 2015-11-10: 225 ug/h via INTRAVENOUS
  Administered 2015-11-11: 100 ug/h via INTRAVENOUS
  Filled 2015-11-09 (×4): qty 250

## 2015-11-09 MED ORDER — CHLORHEXIDINE GLUCONATE 0.12% ORAL RINSE (MEDLINE KIT)
15.0000 mL | Freq: Two times a day (BID) | OROMUCOSAL | Status: DC
  Administered 2015-11-09 – 2015-11-10 (×3): 15 mL via OROMUCOSAL
  Filled 2015-11-09 (×5): qty 15

## 2015-11-09 MED ORDER — NOREPINEPHRINE BITARTRATE 1 MG/ML IV SOLN: 0.0000 ug/min | INTRAVENOUS | Status: DC

## 2015-11-09 MED ORDER — LIDOCAINE-PRILOCAINE 2.5-2.5 % EX CREA
1.0000 "application " | TOPICAL_CREAM | CUTANEOUS | Status: DC | PRN
  Filled 2015-11-09: qty 5

## 2015-11-09 MED ORDER — FENTANYL CITRATE (PF) 100 MCG/2ML IJ SOLN: 50.0000 ug | Freq: Once | INTRAMUSCULAR | Status: DC

## 2015-11-09 MED ORDER — FOLIC ACID 5 MG/ML IJ SOLN
INTRAMUSCULAR | Status: DC
  Administered 2015-11-09 – 2015-11-11 (×9): via INTRAVENOUS
  Filled 2015-11-09 (×18): qty 10

## 2015-11-09 MED ORDER — STERILE WATER FOR INJECTION IV SOLN
INTRAVENOUS | Status: DC
  Administered 2015-11-09 (×2): via INTRAVENOUS
  Filled 2015-11-09 (×6): qty 850

## 2015-11-09 MED ORDER — THIAMINE HCL 100 MG/ML IJ SOLN
100.0000 mg | Freq: Four times a day (QID) | INTRAMUSCULAR | Status: DC
  Administered 2015-11-09 – 2015-11-11 (×9): 100 mg via INTRAVENOUS
  Filled 2015-11-09 (×9): qty 2

## 2015-11-09 MED ORDER — PENTAFLUOROPROP-TETRAFLUOROETH EX AERO
1.0000 "application " | INHALATION_SPRAY | CUTANEOUS | Status: DC | PRN
  Filled 2015-11-09: qty 30

## 2015-11-09 MED ORDER — SODIUM CHLORIDE 0.9 % IV SOLN
10.0000 mg/kg | Freq: Two times a day (BID) | INTRAVENOUS | Status: DC
  Administered 2015-11-09 – 2015-11-10 (×2): 1220 mg via INTRAVENOUS
  Filled 2015-11-09 (×3): qty 1.22

## 2015-11-09 MED ORDER — SODIUM CHLORIDE 0.9 % IV SOLN
50.0000 mg | INTRAVENOUS | Status: DC
  Filled 2015-11-09 (×5): qty 10

## 2015-11-09 MED ORDER — FOLIC ACID 5 MG/ML IJ SOLN: 50.0000 mg | INTRAMUSCULAR | Status: DC

## 2015-11-09 MED ORDER — VECURONIUM BROMIDE 10 MG IV SOLR
10.0000 mg | INTRAVENOUS | Status: AC
  Administered 2015-11-09: 10 mg via INTRAVENOUS

## 2015-11-09 MED ORDER — FENTANYL CITRATE (PF) 100 MCG/2ML IJ SOLN
100.0000 ug | INTRAMUSCULAR | Status: AC
  Administered 2015-11-09: 100 ug via INTRAVENOUS

## 2015-11-09 MED ORDER — HEPARIN SODIUM (PORCINE) 5000 UNIT/ML IJ SOLN
5000.0000 [IU] | Freq: Three times a day (TID) | INTRAMUSCULAR | Status: DC
  Administered 2015-11-09 – 2015-11-16 (×22): 5000 [IU] via SUBCUTANEOUS
  Filled 2015-11-09 (×22): qty 1

## 2015-11-09 MED ORDER — SODIUM CHLORIDE 0.9% FLUSH
3.0000 mL | Freq: Two times a day (BID) | INTRAVENOUS | Status: DC
Start: 2015-11-09 — End: 2015-11-19
  Administered 2015-11-09 – 2015-11-18 (×18): 3 mL via INTRAVENOUS

## 2015-11-09 MED ORDER — SODIUM CHLORIDE 0.9 % IV BOLUS (SEPSIS)
1000.0000 mL | Freq: Once | INTRAVENOUS | Status: AC
  Administered 2015-11-09: 1000 mL via INTRAVENOUS

## 2015-11-09 MED ORDER — FENTANYL BOLUS VIA INFUSION
50.0000 ug | INTRAVENOUS | Status: DC | PRN
  Administered 2015-11-09 – 2015-11-11 (×5): 50 ug via INTRAVENOUS
  Filled 2015-11-09: qty 50

## 2015-11-09 NOTE — Progress Notes (Signed)
eLink notified about Pt's elevated troponin level. Updated poison control on Pt's condition. Passed on toxicologists recommendations to [place] MD. New orders given. eLink MD updated on Pt's condition. Will continue to monitor.

## 2015-11-09 NOTE — Progress Notes (Signed)
eLink notified about Potassium level of 2.9

## 2015-11-09 NOTE — Care Management (Signed)
Presents from home with altered mental status. Patient switched out her provigil (which is usually administered by husband) with Tylenol.  it seems she has taken approximately one month's worth of her provigil over the last few days.  She is currently intubated and on ventilator.  She is going to require crrt.

## 2015-11-09 NOTE — Progress Notes (Signed)
Post dialysis assessment 

## 2015-11-09 NOTE — Progress Notes (Signed)
[facility] Progress Note Patient Name: Jamie Bennett DOB: 12-28-1960 MRN: [id number]   Date of Service  11/09/2015  HPI/Events of Note  Pt admitted with OD and altered mental status.  She is hypotensive, requiring pressors.  She has AKI, elevated LFTs, and anion gap acidosis.   eICU Interventions  Will repeat CMET, check lactic acid, serum osmolarity, ethylene glycol, methanol, ABG.  CCM consulted to assess, and will arrange for central venous access >> will get CXR after that.      Intervention Category Major Interventions: Other:  Isreal Moline 11/09/2015, 2:17 AM

## 2015-11-09 NOTE — Progress Notes (Signed)
Pre dialysis assessment 

## 2015-11-09 NOTE — Telephone Encounter (Signed)
Are we all set then with this lab result?   AD

## 2015-11-09 NOTE — Procedures (Signed)
Endotracheal Intubation: Patient required placement of an artificial airway secondary to resp failure.   Consent: Emergent.   Hand washing performed prior to starting the procedure.   Medications administered for sedation prior to procedure: Midazolam 2 mg IV,  Vecuronium 10 mg IV, Fentanyl 100 mcg IV.   Procedure: A time out procedure was called and correct patient, name, & ID confirmed. Needed supplies and equipment were assembled and checked to include ETT, 10 ml syringe, Glidescope, Mac and Miller blades, suction, oxygen and bag mask valve, end tidal CO2 monitor. Patient was positioned to align the mouth and pharynx to facilitate visualization of the glottis.  Heart rate, SpO2 and blood pressure was continuously monitored during the procedure. Pre-oxygenation was conducted prior to intubation and endotracheal tube was placed through the vocal cords into the trachea.  During intubation an assistant applied gentle pressure to the cricoid cartilage.   The artificial airway was placed under direct visualization via glidescope route using a 8.0 ETT on the first attempt.    ETT was secured at 24 cm mark.    Placement was confirmed by auscuitation of lungs with good breath sounds bilaterally and no stomach sounds.  Condensation was noted on endotracheal tube.  Pulse ox %.  CO2 detector in place with appropriate color change.   Complications: None .   Operator: Yordin Rhoda.   Chest radiograph ordered and pending.   Comments: OGT placed via glidescope.  Corrin Parker, M.D.  [facility]  Medical Director [place] Director [facility] Cardio-Pulmonary Department

## 2015-11-09 NOTE — Telephone Encounter (Signed)
Spoke with DS and he is already aware of critical lab.

## 2015-11-09 NOTE — Consult Note (Signed)
[facility] CONSULT NOTE    Date: 11/09/2015                  Patient Name:  Jamie Bennett  MRN: [id number]  DOB: 1961-06-21  Age / Sex: 55 y.o., female         PCP: Lavera Guise, MD                 Service Requesting Consult: Dr. Johnette Abraham                 Reason for Consult: Severe metabolic acidosis, acute renal failure            History of Present Illness: Patient is a 55 y.o. female with a PMHx of osteoarthritis, osteoporosis, rheumatoid arthritis, bipolar disorder, anxiety, fibromyalgia, status post gastric bypass, who was admitted to [facility] on 11/08/2015 for evaluation of altered mental status. She has history of bipolar disorder with depressive episodes. History unable to obtain from the patient and she is currently intubated. Per dictated history and physical it appears that she may have overdosed on Provigil. She has severe acidosis now with a pH of 6.99 PCO2 32, PO2 of 134. She also has acute renal failure with a creatinine of 2.62 and BUN of 54. Serum bicarbonate is very low at 9.  Osmolar gap was calculated and was quite high at 32.   Medications: Outpatient medications: Prescriptions prior to admission  Medication Sig Dispense Refill Last Dose  . ALPRAZolam (XANAX) 1 MG tablet Take 1 tablet (1 mg total) by mouth 4 (four) times daily as needed. (Patient taking differently: Take 1 mg by mouth 4 (four) times daily as needed for anxiety. ) 360 tablet 0 prn at prn  . Biotin 5000 MCG TABS Take 5,000 mcg by mouth daily.    unknown at unknown  . busPIRone (BUSPAR) 10 MG tablet Take 20 mg by mouth 4 (four) times daily.   unknown at unknown  . Cholecalciferol (HM VITAMIN D3) 4000 units CAPS Take 4,000 capsules by mouth daily.   unknown at unknown  . cyanocobalamin (,VITAMIN B-12,) 1000 MCG/ML injection Inject 1,000 mcg into the muscle every 30 (thirty) days.   10/15/2015 at unknown time  . esomeprazole (NEXIUM) 40 MG capsule Take 1 capsule (40 mg  total) by mouth daily at 12 noon.   unknown at unknown  . Fiber, Guar Gum, CHEW Chew 10 mg by mouth daily.   unknown at unknown  . hydroxychloroquine (PLAQUENIL) 200 MG tablet Take 200 mg by mouth See admin instructions. 200 mg twice a day Mon-Friday only   unknown at unknown  . lamoTRIgine (LAMICTAL) 150 MG tablet Take 150 tablets by mouth 2 (two) times daily.   unknown at unknown  . levothyroxine (SYNTHROID, LEVOTHROID) 112 MCG tablet Take 112 mcg by mouth daily.   unknown at unknown  . modafinil (PROVIGIL) 200 MG tablet Take 1-2 tablets (200-400 mg total) by mouth 2 (two) times daily. 400 mg in the am and 200 mg at noon   unknown at unknown  . Multiple Vitamin (MULTIVITAMIN WITH MINERALS) TABS tablet Take 1 tablet by mouth daily.   unknown at unknown  . tiZANidine (ZANAFLEX) 4 MG capsule Take 4 mg by mouth 2 (two) times daily as needed for muscle spasms.    unknown at unknown  . traZODone (DESYREL) 50 MG tablet Take 1- 4 tablets nightly if needed for insomnia 360 tablet 0 unknown at unknown  . venlafaxine (EFFEXOR) 100 MG  tablet Take 100 mg by mouth 3 (three) times daily.    unknown at unknown  . zoledronic acid (RECLAST) 5 MG/100ML SOLN injection Inject 5 mg into the vein See admin instructions. Patient takes yearly   06/28/2015 at unknown time    Current medications: Current Facility-Administered Medications  Medication Dose Route Frequency Provider Last Rate Last Dose  . acetylcysteine (ACETADOTE) 40,000 mg in dextrose 5 % 1,000 mL (40 mg/mL) infusion  15 mg/kg/hr Intravenous Continuous Nance Pear, MD 45.3 mL/hr at 11/09/15 0345 15 mg/kg/hr at 11/09/15 0345  . antiseptic oral rinse (CPC / CETYLPYRIDINIUM CHLORIDE 0.05%) solution 7 mL  7 mL Mouth Rinse BID Lance Coon, MD   7 mL at 11/09/15 0356  . antiseptic oral rinse solution (CORINZ)  7 mL Mouth Rinse QID Flora Lipps, MD   7 mL at 11/09/15 0357  . chlorhexidine gluconate (SAGE KIT) (PERIDEX) 0.12 % solution 15 mL  15 mL Mouth Rinse  BID Flora Lipps, MD      . fentaNYL (SUBLIMAZE) bolus via infusion 50 mcg  50 mcg Intravenous Q1H PRN Flora Lipps, MD      . fentaNYL (SUBLIMAZE) injection 50 mcg  50 mcg Intravenous Once Flora Lipps, MD   50 mcg at 11/09/15 0404  . fentaNYL 2547mg in NS 2537m(1044mml) infusion-PREMIX  25-400 mcg/hr Intravenous Continuous KurFlora LippsD 5 mL/hr at 11/09/15 0630 50 mcg/hr at 11/09/15 0630  . folic acid injection 50 mg  50 mg Intravenous Q4H VinChesley MiresD   50 mg at 11/09/15 [id number] fomepizole (ANTIZOL) 1,220 mg in sodium chloride 0.9 % 100 mL IVPB  10 mg/kg Intravenous Q12H VinChesley MiresD      . heparin injection 5,000 Units  5,000 Units Subcutaneous 3 times per day DavLance CoonD   5,000 Units at 11/09/15 [id number] levothyroxine (SYNTHROID, LEVOTHROID) injection 56 mcg  56 mcg Intravenous Daily VinChesley MiresD      . norepinephrine (LEVOPHED) 16 mg in dextrose 5 % 250 mL (0.064 mg/mL) infusion  0-40 mcg/min Intravenous Titrated KurFlora LippsD 20.6 mL/hr at 11/09/15 0800 21.973 mcg/min at 11/09/15 0800  . pantoprazole (PROTONIX) injection 40 mg  40 mg Intravenous QHS VinChesley MiresD   40 mg at 11/09/15 0408  . propofol (DIPRIVAN) 1000 MG/100ML infusion  0-50 mcg/kg/min Intravenous Continuous KurFlora LippsD   0 mcg/kg/min at 11/09/15 [id number] pyridOXINE (B-6) injection 100 mg  100 mg Intravenous Q6H VinChesley MiresD   100 mg at 11/09/15 0622  . sodium bicarbonate 150 mEq in sterile water 1,000 mL infusion   Intravenous Continuous KurFlora LippsD 125 mL/hr at 11/09/15 0344    . sodium chloride flush (NS) 0.9 % injection 3 mL  3 mL Intravenous Q12H DavLance CoonD   3 mL at 11/09/15 0358  . thiamine (B-1) injection 100 mg  100 mg Intravenous Q6H VinChesley MiresD   100 mg at 11/09/15 [id number]   Allergies: No Known Allergies    Past Medical History: Past Medical History  Diagnosis Date  . Osteoarthritis   . Osteoporosis   . Rheumatoid arthritis ([place] . Thyroid disease   . Hypotension    . Constipation   . Anemia   . Bipolar 1 disorder ([place] . Anxiety   . Fibromyalgia      Past Surgical History: Past Surgical History  Procedure Laterality Date  . Gastric bypass    . Cholecystectomy  Family History: Family History  Problem Relation Age of Onset  . Depression Mother   . Dementia Mother   . Heart disease Mother   . Parkinson's disease Father      Social History: Social History   Social History  . Marital Status: Married    Spouse Name: N/A  . Number of Children: N/A  . Years of Education: N/A   Occupational History  . Not on file.   Social History Main Topics  . Smoking status: Never Smoker   . Smokeless tobacco: Not on file  . Alcohol Use: No  . Drug Use: No  . Sexual Activity: Yes   Other Topics Concern  . Not on file   Social History Narrative     Review of Systems: Unable to obtain due to altered mental status  Vital Signs: Blood pressure 96/61, pulse 86, temperature 98.3 F (36.8 C), temperature source Axillary, resp. rate 36, height '5\' 6"'  (1.676 m), weight 122.4 kg (269 lb 13.5 oz), SpO2 99 %.  Weight trends: Filed Weights   11/08/15 2124 11/09/15 0200  Weight: 120.657 kg (266 lb) 122.4 kg (269 lb 13.5 oz)    Physical Exam: General: Critically ill appearing  Head: Normocephalic, atraumatic.  Eyes: Anicteric  Nose: Mucous membranes moist, not inflammed, nonerythematous.  Throat: ETT in place  Neck: Supple, trachea midline.  Lungs:  CTAB, vent assisted  Heart: RRR. S1 and S2 normal without gallop, murmur, or rubs.  Abdomen:  BS normoactive. Soft, Nondistended, non-tender.  No masses or organomegaly.  Extremities: No pretibial edema.  Neurologic: Currently on the ventilator, not following commands  Skin: No visible rashes, scars.    Lab results: Basic Metabolic Panel:  Recent Labs Lab 11/08/15 2136 11/09/15 0336  NA 135 135  K 5.4* 4.5  CL 108 110  CO2 8* 9*  GLUCOSE 79 133*  BUN 54* 54*  CREATININE  2.70* 2.62*  CALCIUM 8.0* 6.5*  MG  --  2.1  PHOS  --  6.6*    Liver Function Tests:  Recent Labs Lab 11/08/15 2136 11/09/15 0336  AST 1804* 2132*  ALT 660* 623*  ALKPHOS 140* 130*  BILITOT 1.3* 0.5  PROT 6.8 5.7*  ALBUMIN 3.5 2.9*   No results for input(s): LIPASE, AMYLASE in the last 168 hours. No results for input(s): AMMONIA in the last 168 hours.  CBC:  Recent Labs Lab 11/08/15 2136 11/09/15 0336  WBC 11.6* 10.8  HGB 11.6* 10.8*  HCT 36.0 33.8*  MCV 99.5 100.4*  PLT 205 186    Cardiac Enzymes:  Recent Labs Lab 11/08/15 1238 11/09/15 0336  TROPONINI 0.07* 0.09*    BNP: Invalid input(s): POCBNP  CBG:  Recent Labs Lab 11/09/15 0158  GLUCAP 106*    Microbiology: Results for orders placed or performed during the hospital encounter of 11/08/15  MRSA PCR Screening     Status: None   Collection Time: 11/09/15  2:09 AM  Result Value Ref Range Status   MRSA by PCR NEGATIVE NEGATIVE Final    Comment:        The GeneXpert MRSA Assay (FDA approved for NASAL specimens only), is one component of a comprehensive MRSA colonization surveillance program. It is not intended to diagnose MRSA infection nor to guide or monitor treatment for MRSA infections.     Coagulation Studies:  Recent Labs  11/08/15 2136  LABPROT 18.1*  INR 1.49    Urinalysis: No results for input(s): COLORURINE, LABSPEC, [place], GLUCOSEU, [place], [place], KETONESUR, PROTEINUR,  UROBILINOGEN, NITRITE, LEUKOCYTESUR in the last 72 hours.  Invalid input(s): APPERANCEUR    Imaging: Dg Abd 1 View  11/09/2015  CLINICAL DATA:  Abdominal pain EXAM: ABDOMEN - 1 VIEW COMPARISON:  None. FINDINGS: The nasogastric tube he extends into the stomach with tip in the region of the proximal gastric body. There are dilated loops of bowel, incompletely imaged. IMPRESSION: Nasogastric tube reaches the stomach. Electronically Signed   By: Andreas Newport M.D.   On: 11/09/2015 03:34   Ct  Head Wo Contrast  11/08/2015  CLINICAL DATA:  Took 218 Provigil tablets over 5 days. Lethargy, with slurred speech. Initial encounter. EXAM: CT HEAD WITHOUT CONTRAST TECHNIQUE: Contiguous axial images were obtained from the base of the skull through the vertex without intravenous contrast. COMPARISON:  CT of the head performed 05/07/2013, and MRI of the brain performed 08/17/2013 FINDINGS: There is no evidence of acute infarction, mass lesion, or intra- or extra-axial hemorrhage on CT. The posterior fossa, including the cerebellum, brainstem and fourth ventricle, is within normal limits. The third and lateral ventricles, and basal ganglia are unremarkable in appearance. The cerebral hemispheres are symmetric in appearance, with normal gray-white differentiation. No mass effect or midline shift is seen. There is no evidence of fracture; visualized osseous structures are unremarkable in appearance. The visualized portions of the orbits are within normal limits. The paranasal sinuses and mastoid air cells are well-aerated. No significant soft tissue abnormalities are seen. IMPRESSION: Unremarkable noncontrast CT of the head. Electronically Signed   By: Garald Balding M.D.   On: 11/08/2015 23:20   Dg Chest Port 1 View  11/09/2015  CLINICAL DATA:  Endotracheal tube and orogastric tube placement. Left-sided central line placement. Altered mental status. Initial encounter. EXAM: PORTABLE CHEST 1 VIEW COMPARISON:  Chest radiograph performed 08/16/2013 FINDINGS: The patient's endotracheal tube is seen ending 1-2 cm above the carina. This could be retracted 1-2 cm. An enteric tube is noted extending below the diaphragm. A left IJ line is noted ending about the distal SVC. Mild vascular congestion is noted. Mild patchy left-sided airspace opacity could reflect mild pneumonia or possibly asymmetric interstitial edema. No pleural effusion or pneumothorax is seen. The cardiomediastinal silhouette is borderline enlarged. No  acute osseous abnormalities are identified. IMPRESSION: 1. Endotracheal tube seen ending 1-2 cm above the carina. This could be retracted 1-2 cm. 2. Enteric tube noted extending below the diaphragm. 3. Left IJ line noted ending about the distal SVC. 4. Mild vascular congestion and borderline cardiomegaly. Mild patchy left-sided airspace opacity could reflect mild pneumonia or possibly asymmetric interstitial edema. Electronically Signed   By: Garald Balding M.D.   On: 11/09/2015 03:38      Assessment & Plan: Pt is a 55 y.o. female  with a PMHx of osteoarthritis, osteoporosis, rheumatoid arthritis, bipolar disorder, anxiety, fibromyalgia, status post gastric bypass, who was admitted to [facility] on 11/08/2015 for evaluation of altered mental status. Patient apparently took many of her outpatient prescriptions over the past 4-5 days. She may have possibly also overdosed on Provigil.  1.  Metabolic acidosis  2.  Acute renal failure. 3.  drug overdose, patient apparently took progovigil, elevated osmolar gap of 32.  4.  Acute respiratory failure.   Plan:  The patient has severe metabolic acidosis at this moment. We will proceed with a trial of hemodialysis. We will plan for 3.5 hours with a blood flow rate of 400 and dialysis flow rate of 800. Patient may end up requiring additional hemodialysis later in  the day. She was found to have an elevated osmolar gap of 32. Serum volatile screen has been sent and is pending. Continue ventilatory support at this point in time. Also continue to stay in contact with poison control and follow their recommendations. Thanks for consultation.

## 2015-11-09 NOTE — Progress Notes (Signed)
I notified Neoma Laming from [employer] of patient's lab results (volatiles).  Notified of Acetone level 56 and acetest moderate.  Neoma Laming is Environmental health practitioner and will get back with me.

## 2015-11-09 NOTE — Procedures (Signed)
Central Venous Dailysis Catheter Placement: Indication: Hemo Dialysis/CRRT   Consent:written  Risks and benefits explained in detail including risk of infection, bleeding, respiratory failure and death..   Hand washing performed prior to starting the procedure.   Procedure: An active timeout was performed and correct patient, name, & ID confirmed.  After explaining risk and benefits, patient was positioned correctly for central venous access. Patient was prepped using strict sterile technique including chlorohexadine preps, sterile drape, sterile gown and sterile gloves.  The area was prepped, draped and anesthetized in the usual sterile manner. Patient comfort was obtained.  A triple lumen catheter was placed in RT femoral Vein There was good blood return, catheter caps were placed on lumens, catheter flushed easily, the line was secured and a sterile dressing and BIO-PATCH applied.   Ultrasound was used to visualize vasculature and guidance of needle.   Number of Attempts: 1 Complications:none  Estimated Blood Loss: none Chest Radiograph indicated and ordered.  Operator: Sharri Loya.   Jamie Bennett, M.D.  [facility]  Medical Director [place] Director [facility] Cardio-Pulmonary Department

## 2015-11-09 NOTE — Progress Notes (Signed)
eLink Physician-Brief Progress Note Patient Name: Jamie Bennett DOB: 07-07-1961 MRN: [id number]   Date of Service  11/09/2015  HPI/Events of Note  Profound metabolic acidosis.  HCO3 already started.   eICU Interventions  Will increase RR to 30 and f/u ABG.     Intervention Category Major Interventions: Other:  Jamie Bennett 11/09/2015, 4:38 AM

## 2015-11-09 NOTE — H&P (Signed)
[facility] Consultation      Name: LORALYE REFFNER MRN: [id number] DOB: [birth year]    ADMISSION DATE:  11/08/2015    CHIEF COMPLAINT:   Acute mental status changes   HISTORY OF PRESENT ILLNESS  55 y.o. female who presents with altered mental status.  -she has bipolar, with depressive episodes.  -She has a prescription for Provigil,  - Over the last 4-5 days she took majority of her monthly prescription meds. - she was lethargic and increased WOB upon initial assessment, I deemed that she needs vent support and then ABG results show severe metabolic acidosis -patient with multiorgan failure      PAST MEDICAL HISTORY    :  Past Medical History  Diagnosis Date  . Osteoarthritis   . Osteoporosis   . Rheumatoid arthritis ([place])   . Thyroid disease   . Hypotension   . Constipation   . Anemia   . Bipolar 1 disorder ([place])   . Anxiety   . Fibromyalgia    Past Surgical History  Procedure Laterality Date  . Gastric bypass    . Cholecystectomy     Prior to Admission medications   Medication Sig Start Date End Date Taking? Authorizing Provider  ALPRAZolam Duanne Moron) 1 MG tablet Take 1 tablet (1 mg total) by mouth 4 (four) times daily as needed. Patient taking differently: Take 1 mg by mouth 4 (four) times daily as needed for anxiety.  08/04/15  Yes Marjie Skiff, MD  Biotin 5000 MCG TABS Take 5,000 mcg by mouth daily.    Yes Historical Provider, MD  busPIRone (BUSPAR) 10 MG tablet Take 20 mg by mouth 4 (four) times daily.   Yes Historical Provider, MD  Cholecalciferol (HM VITAMIN D3) 4000 units CAPS Take 4,000 capsules by mouth daily.   Yes Historical Provider, MD  cyanocobalamin (,VITAMIN B-12,) 1000 MCG/ML injection Inject 1,000 mcg into the muscle every 30 (thirty) days.   Yes Historical Provider, MD  esomeprazole (NEXIUM) 40 MG capsule Take 1 capsule (40 mg total) by mouth daily at 12 noon. 03/04/14  Yes Benjamine Mola, FNP  Fiber, Guar Gum, CHEW  Chew 10 mg by mouth daily.   Yes Historical Provider, MD  hydroxychloroquine (PLAQUENIL) 200 MG tablet Take 200 mg by mouth See admin instructions. 200 mg twice a day Mon-Friday only   Yes Historical Provider, MD  lamoTRIgine (LAMICTAL) 150 MG tablet Take 150 tablets by mouth 2 (two) times daily.   Yes Historical Provider, MD  levothyroxine (SYNTHROID, LEVOTHROID) 112 MCG tablet Take 112 mcg by mouth daily. 07/06/15  Yes Historical Provider, MD  modafinil (PROVIGIL) 200 MG tablet Take 1-2 tablets (200-400 mg total) by mouth 2 (two) times daily. 400 mg in the am and 200 mg at noon 03/04/14  Yes Benjamine Mola, FNP  Multiple Vitamin (MULTIVITAMIN WITH MINERALS) TABS tablet Take 1 tablet by mouth daily.   Yes Historical Provider, MD  tiZANidine (ZANAFLEX) 4 MG capsule Take 4 mg by mouth 2 (two) times daily as needed for muscle spasms.    Yes Historical Provider, MD  traZODone (DESYREL) 50 MG tablet Take 1- 4 tablets nightly if needed for insomnia 08/17/15  Yes Marjie Skiff, MD  venlafaxine (EFFEXOR) 100 MG tablet Take 100 mg by mouth 3 (three) times daily.    Yes Historical Provider, MD  zoledronic acid (RECLAST) 5 MG/100ML SOLN injection Inject 5 mg into the vein See admin instructions. Patient takes yearly   Yes Historical Provider, MD  No Known Allergies   FAMILY HISTORY   Family History  Problem Relation Age of Onset  . Depression Mother   . Dementia Mother   . Heart disease Mother   . Parkinson's disease Father       SOCIAL HISTORY    reports that she has never smoked. She does not have any smokeless tobacco history on file. She reports that she does not drink alcohol or use illicit drugs.  Review of Systems  Unable to perform ROS: critical illness      VITAL SIGNS    Temp:  [96.2 F (35.7 C)-97.4 F (36.3 C)] 96.2 F (35.7 C) (04/11 0200) Pulse Rate:  [64-71] 70 (04/11 0200) Resp:  [20-31] 23 (04/11 0200) BP: (64-97)/(36-83) 97/83 mmHg (04/11 0200) SpO2:  [97 %-100  %] 97 % (04/11 0200) Weight:  [266 lb (120.657 kg)-269 lb 13.5 oz (122.4 kg)] 269 lb 13.5 oz (122.4 kg) (04/11 0200) HEMODYNAMICS:   VENTILATOR SETTINGS:   INTAKE / OUTPUT:  Intake/Output Summary (Last 24 hours) at 11/09/15 0309 Last data filed at 11/09/15 0200  Gross per 24 hour  Intake 738.38 ml  Output      0 ml  Net 738.38 ml       PHYSICAL EXAM   Physical Exam  Constitutional: She appears distressed.  HENT:  Head: Normocephalic and atraumatic.  Eyes: Pupils are equal, round, and reactive to light. No scleral icterus.  Neck: Normal range of motion. Neck supple.  Cardiovascular: Normal rate and regular rhythm.   No murmur heard. Pulmonary/Chest: She is in respiratory distress. She has no wheezes. She has rales.  resp distress  Abdominal: Soft. She exhibits no distension. There is no tenderness.  Musculoskeletal: She exhibits no edema.  Neurological: She displays normal reflexes. Coordination normal.  gcs<8T  Skin: Skin is warm. No rash noted. She is diaphoretic.       LABS   LABS:  CBC  Recent Labs Lab 11/08/15 2136  WBC 11.6*  HGB 11.6*  HCT 36.0  PLT 205   Coag's  Recent Labs Lab 11/08/15 2136  APTT 29  INR 1.49   BMET  Recent Labs Lab 11/08/15 2136  NA 135  K 5.4*  CL 108  CO2 8*  BUN 54*  CREATININE 2.70*  GLUCOSE 79   Electrolytes  Recent Labs Lab 11/08/15 2136  CALCIUM 8.0*     Recent Labs Lab 11/09/15 0225  PHART 7.05*  PCO2ART 20*  PO2ART 94   Liver Enzymes  Recent Labs Lab 11/08/15 2136  AST 1804*  ALT 660*  ALKPHOS 140*  BILITOT 1.3*  ALBUMIN 3.5   Cardiac Enzymes  Recent Labs Lab 11/08/15 1238  TROPONINI 0.07*   Glucose  Recent Labs Lab 11/09/15 0158  GLUCAP 106*     No results found for this or any previous visit (from the past 240 hour(s)).   Current facility-administered medications:  .  0.9 %  sodium chloride infusion, , Intravenous, Continuous, Lance Coon, MD, Last Rate: 150  mL/hr at 11/09/15 0210 .  [COMPLETED] acetylcysteine (ACETADOTE) 40 mg/mL load via infusion 18,105 mg, 150 mg/kg, Intravenous, Once, Stopped at 11/09/15 0037 **FOLLOWED BY** acetylcysteine (ACETADOTE) 40,000 mg in dextrose 5 % 1,000 mL (40 mg/mL) infusion, 15 mg/kg/hr, Intravenous, Continuous, Nance Pear, MD, Last Rate: 45.3 mL/hr at 11/09/15 0200, 15 mg/kg/hr at 11/09/15 0200 .  antiseptic oral rinse (CPC / CETYLPYRIDINIUM CHLORIDE 0.05%) solution 7 mL, 7 mL, Mouth Rinse, BID, Lance Coon, MD .  heparin injection 5,000  Units, 5,000 Units, Subcutaneous, 3 times per day, Lance Coon, MD .  levothyroxine (SYNTHROID, LEVOTHROID) injection 56 mcg, 56 mcg, Intravenous, Daily, Chesley Mires, MD .  norepinephrine (LEVOPHED) 4mg  in D5W 237mL premix infusion, 0-40 mcg/min, Intravenous, Titrated, Lance Coon, MD, Last Rate: 75 mL/hr at 11/09/15 0200, 20 mcg/min at 11/09/15 0200 .  pantoprazole (PROTONIX) injection 40 mg, 40 mg, Intravenous, QHS, Vineet Sood, MD .  sodium bicarbonate 150 mEq in sterile water 1,000 mL infusion, , Intravenous, Continuous, Flora Lipps, MD .  sodium chloride flush (NS) 0.9 % injection 3 mL, 3 mL, Intravenous, Q12H, Lance Coon, MD  IMAGING    Ct Head Wo Contrast  11/08/2015  CLINICAL DATA:  Took 218 Provigil tablets over 5 days. Lethargy, with slurred speech. Initial encounter. EXAM: CT HEAD WITHOUT CONTRAST TECHNIQUE: Contiguous axial images were obtained from the base of the skull through the vertex without intravenous contrast. COMPARISON:  CT of the head performed 05/07/2013, and MRI of the brain performed 08/17/2013 FINDINGS: There is no evidence of acute infarction, mass lesion, or intra- or extra-axial hemorrhage on CT. The posterior fossa, including the cerebellum, brainstem and fourth ventricle, is within normal limits. The third and lateral ventricles, and basal ganglia are unremarkable in appearance. The cerebral hemispheres are symmetric in appearance, with normal  gray-white differentiation. No mass effect or midline shift is seen. There is no evidence of fracture; visualized osseous structures are unremarkable in appearance. The visualized portions of the orbits are within normal limits. The paranasal sinuses and mastoid air cells are well-aerated. No significant soft tissue abnormalities are seen. IMPRESSION: Unremarkable noncontrast CT of the head. Electronically Signed   By: Garald Balding M.D.   On: 11/08/2015 23:20      Indwelling Urinary Catheter continued, requirement due to   Reason to continue Indwelling Urinary Catheter for strict Intake/Output monitoring for hemodynamic instability   Central Line continued, requirement due to   Reason to continue [employer] Monitoring of central venous pressure or other hemodynamic parameters   Ventilator continued, requirement due to, resp failure    Ventilator Sedation RASS 0 to -2     INDWELLING DEVICES:: ETT 8.0 >>>>4/11 LEFT IJ CVL>>>>4/11  MICRO DATA: MRSA PCR pending Urine  Blood Resp   ANTIMICROBIALS:     ASSESSMENT/PLAN  55 yo white female admitted to ICU for acute and severe metabolic acidosis with metabolic encephalopathy from acute Drug overdose with liver and renal failure with shock, patient with multiorgan failure  PULMONARY -Respiratory Failure -continue Full MV support -continue Bronchodilator Therapy -Wean Fio2 and PEEP as tolerated -follow up ABG and CXR   CARDIOVASCULAR SHock-volume,cardiogenic,acidosis -CVl placed -use vasopressors to keep MAP>65 -start bicarb infusion -check CVP and LA  RENAL ARF from ATN -follow chem 7 -follow UO -foley catheter  GASTROINTESTINAL OG placed-place to suction -don't start TF's  HEMATOLOGIC Follow h/h -follow CBC  INFECTIOUS Obtain cultures, no abx for now  ENDOCRINE - ICU hypoglycemic\Hyperglycemia protocol   NEUROLOGIC - intubated and sedated - minimal sedation to achieve a RASS goal: -1     I  have personally obtained a history, examined the patient, evaluated laboratory and independently reviewed  imaging results, formulated the assessment and plan and placed orders.  The Patient requires high complexity decision making for assessment and support, frequent evaluation and titration of therapies, application of advanced monitoring technologies and extensive interpretation of multiple databases. Critical Care Time devoted to patient care services described in this note is 55 minutes.   Overall, patient  is critically ill, prognosis is guarded. Patient at high risk for cardiac arrest and death.    Corrin Parker, M.D.  [facility]  Medical Director [place] Director [facility] Cardio-Pulmonary Department

## 2015-11-09 NOTE — Progress Notes (Signed)
eLink Physician-Brief Progress Note Patient Name: Jamie Bennett DOB: [birth year] MRN: [id number]   Date of Service  11/09/2015  HPI/Events of Note  Persistent anion gap metabolic acidosis.  Lactic acid only 1.6.     eICU Interventions  Will increase RR to 35.  Will start fomepizole for possible ethylene glycol/methanol ingestion.      Intervention Category Major Interventions: Other:  Derinda Bartus 11/09/2015, 5:40 AM

## 2015-11-09 NOTE — Telephone Encounter (Signed)
FYI   Critical lab value.  Acetone Level of 56

## 2015-11-09 NOTE — Telephone Encounter (Signed)
This message has already been taken care of. We were able to reach [facility] in [place] and she has already spoke with Dr Alva Garnet. Nothing further needed.

## 2015-11-09 NOTE — Progress Notes (Signed)
Poison control called and were updated on Pt's condition for the second time this shift. Deborah with Poison control said she would update their toxicologist. She will call back with anymore questions. No recommendations at this time.

## 2015-11-09 NOTE — Telephone Encounter (Signed)
Received a call report from [facility] with [facility] hospital  Critical Acetone level of 56  I explained to him that I would inform AD of results in Dr. Zoila Shutter absence. He voiced understanding and had no further questions.

## 2015-11-09 NOTE — Progress Notes (Signed)
eLink Physician-Brief Progress Note Patient Name: Jamie Bennett DOB: Feb 24, 1961 MRN: [id number]   Date of Service  11/09/2015  HPI/Events of Note  Received information from poison control.  Recommending high dose thiamine, folic acid, B6 in addition to fomepizole.  Also recommend checking ammonia level.   eICU Interventions  Orders placed.  Also spoke with Dr. Holley Raring from nephrology.  Will need to have HD catheter placed to start CRRT.  PCCM bedside team informed, and will arrange for catheter placement.         Timothee Gali 11/09/2015, 5:59 AM

## 2015-11-09 NOTE — Progress Notes (Signed)
Initial Nutrition Assessment     INTERVENTION:   EN: discussed nutritional poc during ICU rounds, MD Simonds would like to hold off on enteral nutrition today and reassess tomorrow on follow-up  NUTRITION DIAGNOSIS:   Inadequate oral intake related to acute illness as evidenced by NPO status.  GOAL:   Provide needs based on ASPEN/SCCM guidelines  MONITOR:   Vent status, Labs, I & O's, Weight trends  REASON FOR ASSESSMENT:   Ventilator    ASSESSMENT:   55 yo female admitted with acute metabolic encephalopathy with severe metabolic acidosis s/p drug overdose with multi organ failure requiring intubation and  initiation of HD   Patient is currently intubated on ventilator support MV: 18.9 L/min Temp (24hrs), Avg:98.4 F (36.9 C), Min:96.2 F (35.7 C), Max:100 F (37.8 C)   Past Medical History  Diagnosis Date  . Osteoarthritis   . Osteoporosis   . Rheumatoid arthritis ([place])   . Thyroid disease   . Hypotension   . Constipation   . Anemia   . Bipolar 1 disorder ([place])   . Anxiety   . Fibromyalgia      Diet Order:   NPO  Skin:  Reviewed, no issues  Last BM:  no documented BM   Digestive System:  OG with tip located in proximal gastric body, abdomen soft, obese, BS active  Labs: ammonia 161, corrected calcium 7.2 (albumin 3.0), sodium 134, Creatinine 2.95, BUN 60  Meds: sodium bicarb at 125 ml/hr, levophed (20 mcg/min), fentanyl  Height:   Ht Readings from Last 1 Encounters:  11/09/15 5\' 6"  (1.676 m)    Weight:   Wt Readings from Last 1 Encounters:  11/09/15 269 lb 13.5 oz (122.4 kg)    BMI:  Body mass index is 43.57 kg/(m^2).  Estimated Nutritional Needs:   Kcal:  [id number] kcals   Protein:  118 g (2.0 g/kg) using IBW  Fluid:  >1.3 L  EDUCATION NEEDS:   Education needs no appropriate at this time   Kerman Passey [place], [place], [place] [phone] Pager  [phone] Weekend/On-Call Pager

## 2015-11-09 NOTE — Procedures (Signed)
Central Venous Catheter Placement: Indication: Patient receiving vesicant or irritant drug.; Patient receiving intravenous therapy for longer than 5 days.; Patient has limited or no vascular access.   Consent:emergent  Risks and benefits explained in detail including risk of infection, bleeding, respiratory failure and death..   Hand washing performed prior to starting the procedure.   Procedure: An active timeout was performed and correct patient, name, & ID confirmed.  After explaining risk and benefits, patient was positioned correctly for central venous access. Patient was prepped using strict sterile technique including chlorohexadine preps, sterile drape, sterile gown and sterile gloves.  The area was prepped, draped and anesthetized in the usual sterile manner. Patient comfort was obtained.  A triple lumen catheter was placed in LEFT Internal Jugular Vein There was good blood return, catheter caps were placed on lumens, catheter flushed easily, the line was secured and a sterile dressing and BIO-PATCH applied.   Ultrasound was used to visualize vasculature and guidance of needle.   Number of Attempts: 1 Complications:none Estimated Blood Loss: none Chest Radiograph indicated and ordered.  Operator: Ashyla Luth.   Jamie Bennett David Sherill Mangen, M.D.  [facility]  Medical Director ICU-[facility] [place] Medical Director [facility] Cardio-Pulmonary Department     

## 2015-11-09 NOTE — Progress Notes (Signed)
Pre-hd tx 

## 2015-11-09 NOTE — Progress Notes (Signed)
Hemodialysis start 

## 2015-11-10 ENCOUNTER — Inpatient Hospital Stay: Payer: Medicare HMO

## 2015-11-10 LAB — BASIC METABOLIC PANEL
Anion gap: 17 — ABNORMAL HIGH (ref 5–15)
BUN: 23 mg/dL — ABNORMAL HIGH (ref 6–20)
CO2: 21 mmol/L — ABNORMAL LOW (ref 22–32)
Calcium: 7.1 mg/dL — ABNORMAL LOW (ref 8.9–10.3)
Chloride: 100 mmol/L — ABNORMAL LOW (ref 101–111)
Creatinine, Ser: 1.83 mg/dL — ABNORMAL HIGH (ref 0.44–1.00)
GFR calc Af Amer: 35 mL/min — ABNORMAL LOW (ref >60)
GFR calc non Af Amer: 30 mL/min — ABNORMAL LOW (ref >60)
Glucose, Bld: 79 mg/dL (ref 65–99)
Potassium: 3.6 mmol/L (ref 3.5–5.1)
Sodium: 138 mmol/L (ref 135–145)

## 2015-11-10 LAB — GLUCOSE, CAPILLARY
Glucose-Capillary: 70 mg/dL (ref 65–99)
Glucose-Capillary: 81 mg/dL (ref 65–99)
Glucose-Capillary: 76 mg/dL (ref 65–99)

## 2015-11-10 LAB — MISCELLANEOUS TEST

## 2015-11-10 LAB — EXPECTORATED SPUTUM ASSESSMENT W GRAM STAIN, RFLX TO RESP C

## 2015-11-10 LAB — HEPATITIS B CORE ANTIBODY, TOTAL: Hep B Core Total Ab: NEGATIVE

## 2015-11-10 LAB — HEPATIC FUNCTION PANEL
ALT: 843 U/L — ABNORMAL HIGH (ref 14–54)
AST: 1210 U/L — ABNORMAL HIGH (ref 15–41)
Albumin: 2.6 g/dL — ABNORMAL LOW (ref 3.5–5.0)
Alkaline Phosphatase: 167 U/L — ABNORMAL HIGH (ref 38–126)
Bilirubin, Direct: 0.4 mg/dL (ref 0.1–0.5)
Indirect Bilirubin: 1.2 mg/dL — ABNORMAL HIGH (ref 0.3–0.9)
Total Bilirubin: 1.6 mg/dL — ABNORMAL HIGH (ref 0.3–1.2)
Total Protein: 5.4 g/dL — ABNORMAL LOW (ref 6.5–8.1)

## 2015-11-10 LAB — PROTIME-INR
INR: 1.96
Prothrombin Time: 22.2 seconds — ABNORMAL HIGH (ref 11.4–15.0)

## 2015-11-10 LAB — MAGNESIUM: Magnesium: 2.2 mg/dL (ref 1.7–2.4)

## 2015-11-10 LAB — COMPREHENSIVE METABOLIC PANEL
ALT: 821 U/L — ABNORMAL HIGH (ref 14–54)
AST: 1493 U/L — ABNORMAL HIGH (ref 15–41)
Albumin: 2.6 g/dL — ABNORMAL LOW (ref 3.5–5.0)
Alkaline Phosphatase: 138 U/L — ABNORMAL HIGH (ref 38–126)
Anion gap: 22 — ABNORMAL HIGH (ref 5–15)
BUN: 41 mg/dL — ABNORMAL HIGH (ref 6–20)
CO2: 14 mmol/L — ABNORMAL LOW (ref 22–32)
Calcium: 6.8 mg/dL — ABNORMAL LOW (ref 8.9–10.3)
Chloride: 101 mmol/L (ref 101–111)
Creatinine, Ser: 2.7 mg/dL — ABNORMAL HIGH (ref 0.44–1.00)
GFR calc Af Amer: 22 mL/min — ABNORMAL LOW (ref >60)
GFR calc non Af Amer: 19 mL/min — ABNORMAL LOW (ref >60)
Glucose, Bld: 69 mg/dL (ref 65–99)
Potassium: 3.2 mmol/L — ABNORMAL LOW (ref 3.5–5.1)
Sodium: 137 mmol/L (ref 135–145)
Total Bilirubin: 2.1 mg/dL — ABNORMAL HIGH (ref 0.3–1.2)
Total Protein: 5.2 g/dL — ABNORMAL LOW (ref 6.5–8.1)

## 2015-11-10 LAB — CBC
HCT: 29.9 % — ABNORMAL LOW (ref 35.0–47.0)
Hemoglobin: 9.9 g/dL — ABNORMAL LOW (ref 12.0–16.0)
MCH: 31.7 pg (ref 26.0–34.0)
MCHC: 33.3 g/dL (ref 32.0–36.0)
MCV: 95.4 fL (ref 80.0–100.0)
Platelets: 140 10*3/uL — ABNORMAL LOW (ref 150–440)
RBC: 3.13 MIL/uL — ABNORMAL LOW (ref 3.80–5.20)
RDW: 16 % — ABNORMAL HIGH (ref 11.5–14.5)
WBC: 2.9 10*3/uL — ABNORMAL LOW (ref 3.6–11.0)

## 2015-11-10 LAB — ETHYLENE GLYCOL: Ethylene Glycol Lvl: NOT DETECTED mg/dL

## 2015-11-10 LAB — EXPECTORATED SPUTUM ASSESSMENT W REFEX TO RESP CULTURE

## 2015-11-10 LAB — HEPATITIS B SURFACE ANTIGEN: Hepatitis B Surface Ag: NEGATIVE

## 2015-11-10 LAB — PROCALCITONIN: Procalcitonin: 12.13 ng/mL

## 2015-11-10 LAB — HEPATITIS B SURFACE ANTIBODY,QUALITATIVE: Hep B S Ab: REACTIVE

## 2015-11-10 LAB — PHOSPHORUS: Phosphorus: 3.2 mg/dL (ref 2.5–4.6)

## 2015-11-10 LAB — APTT: aPTT: 35 seconds (ref 24–36)

## 2015-11-10 MED ORDER — DILTIAZEM HCL 100 MG IV SOLR
5.0000 mg/h | INTRAVENOUS | Status: DC
  Filled 2015-11-10: qty 100

## 2015-11-10 MED ORDER — PANTOPRAZOLE SODIUM 40 MG PO PACK
40.0000 mg | PACK | Freq: Every day | ORAL | Status: DC
  Administered 2015-11-10: 40 mg
  Filled 2015-11-10: qty 20

## 2015-11-10 MED ORDER — PRO-STAT SUGAR FREE PO LIQD
30.0000 mL | Freq: Two times a day (BID) | ORAL | Status: DC
  Administered 2015-11-10 (×2): 30 mL via ORAL

## 2015-11-10 MED ORDER — DEXTROSE 5 % IV SOLN
1.0000 g | INTRAVENOUS | Status: DC
  Administered 2015-11-10 – 2015-11-15 (×6): 1 g via INTRAVENOUS
  Filled 2015-11-10 (×7): qty 10

## 2015-11-10 MED ORDER — ACETYLCYSTEINE 20 % IN SOLN
7500.0000 mg | RESPIRATORY_TRACT | Status: DC
Start: 2015-11-10 — End: 2015-11-11
  Administered 2015-11-10 – 2015-11-11 (×6): 7500 mg
  Filled 2015-11-10 (×17): qty 40

## 2015-11-10 MED ORDER — CHLORHEXIDINE GLUCONATE 0.12% ORAL RINSE (MEDLINE KIT)
15.0000 mL | Freq: Two times a day (BID) | OROMUCOSAL | Status: DC
  Administered 2015-11-10 – 2015-11-15 (×8): 15 mL via OROMUCOSAL
  Filled 2015-11-10 (×13): qty 15

## 2015-11-10 MED ORDER — VITAL HIGH PROTEIN PO LIQD
1000.0000 mL | ORAL | Status: DC
Start: 2015-11-10 — End: 2015-11-11
  Administered 2015-11-10: 1000 mL

## 2015-11-10 MED ORDER — POTASSIUM CHLORIDE 20 MEQ/15ML (10%) PO SOLN
40.0000 meq | Freq: Once | ORAL | Status: AC
  Administered 2015-11-10: 40 meq
  Filled 2015-11-10: qty 30

## 2015-11-10 MED ORDER — VITAL HIGH PROTEIN PO LIQD: 1000.0000 mL | ORAL | Status: DC

## 2015-11-10 MED ORDER — ACETAMINOPHEN 325 MG PO TABS
650.0000 mg | ORAL_TABLET | Freq: Three times a day (TID) | ORAL | Status: DC | PRN
  Administered 2015-11-10: 650 mg via ORAL
  Filled 2015-11-10: qty 2

## 2015-11-10 MED ORDER — ACETYLCYSTEINE 20 % IN SOLN
70.0000 mg/kg | RESPIRATORY_TRACT | Status: DC
  Filled 2015-11-10 (×8): qty 60

## 2015-11-10 MED ORDER — LEVOTHYROXINE SODIUM 112 MCG PO TABS
112.0000 ug | ORAL_TABLET | Freq: Every day | ORAL | Status: DC
  Administered 2015-11-11: 112 ug via ORAL
  Filled 2015-11-10: qty 1

## 2015-11-10 MED ORDER — ANTISEPTIC ORAL RINSE SOLUTION (CORINZ)
7.0000 mL | Freq: Four times a day (QID) | OROMUCOSAL | Status: DC
  Administered 2015-11-10 – 2015-11-14 (×16): 7 mL via OROMUCOSAL
  Filled 2015-11-10 (×20): qty 7

## 2015-11-10 NOTE — Progress Notes (Signed)
Pre dialysis assessment 

## 2015-11-10 NOTE — Progress Notes (Signed)
End treatment assessment.

## 2015-11-10 NOTE — Progress Notes (Signed)
Notified Dr. Mortimer Fries pt now spiking temperatures greater than 101 per md orders placed order for urine culture, blood cultures, and tylenol 650 mg po q8hrs prn as needed for fever

## 2015-11-10 NOTE — Progress Notes (Signed)
eLink MD notified about Pt's rhythm change and increased HR. Orders to start a cardizem drip given. Before drip arrived from pharmacy Pt converted back to a sinus rhythm with a controlled rate. eLink MD updated. Verbal orders to hold drip for now and start if Pt switches again. Will continue to monitor Pt closely

## 2015-11-10 NOTE — Progress Notes (Signed)
Notified Dr. Alva Garnet of WBC of 2.9 md acknowledged no further orders at this time

## 2015-11-10 NOTE — Progress Notes (Signed)
Nutrition Follow-up    INTERVENTION:  -Dietitian Consult received for tube feeding initiation and management; Adult Tube Feeding Protocol entered by MD. Based on estimated needs per ASPEN guidelines, recommend continuing Vital High Protein of initial rate of 20 ml/hr with new goal rate of 55 ml/hr with addition of Prostat BID providing 1520 kcals, 146 g of protein, 1110 mL of free water. Meets 100% of estimated calorie and protein needs. Continue to assess   NUTRITION DIAGNOSIS:   Inadequate oral intake related to acute illness as evidenced by NPO status. Being addressed via TF  GOAL:   Provide needs based on ASPEN/SCCM guidelines  MONITOR:   Vent status, Labs, I & O's, Weight trends  REASON FOR ASSESSMENT:   Consult Enteral/tube feeding initiation and management  ASSESSMENT:   55 yo female admitted with acute metabolic encephalopathy with severe metabolic acidosis s/p drug overdose with multi organ failure requiring intubation and  initiation of HD   Pt received HD yesterday with no UF, plan for HD again today, levophed at 11 mcg/min  Patient is currently intubated on ventilator support MV: 13.7 L/min Temp (24hrs), Avg:99.2 F (37.3 C), Min:97.9 F (36.6 C), Max:99.9 F (37.7 C)  Diet Order:   NPO  Skin:  Reviewed, no issues  Last BM:  no documented BM   Digestive System: abdomen soft, BS hypoactive  Labs: potassium 3.2, sodium wdl, magnesium wdl, phosphorus wdl, corrected calcium 7.9 (albumin 2.6)  Meds: levophed, fentanyl  Height:   Ht Readings from Last 1 Encounters:  11/09/15 5\' 6"  (1.676 m)    Weight:   Wt Readings from Last 1 Encounters:  11/09/15 276 lb 3.8 oz (125.3 kg)    Filed Weights   11/09/15 0200 11/09/15 0945 11/09/15 1415  Weight: 269 lb 13.5 oz (122.4 kg) 276 lb 3.8 oz (125.3 kg) 276 lb 3.8 oz (125.3 kg)    BMI:  Body mass index is 44.61 kg/(m^2).  Estimated Nutritional Needs:   Kcal:  [id number] kcals   Protein:  118 g (2.0  g/kg) using IBW  Fluid:  >1.3 L  EDUCATION NEEDS:   Education needs no appropriate at this time   Kerman Passey [place], [place], [place] [phone] Pager  [phone] Weekend/On-Call Pager

## 2015-11-10 NOTE — Progress Notes (Addendum)
eLink Physician-Brief Progress Note Patient Name: Jamie Bennett DOB: [birth year] MRN: [id number]   Date of Service  11/10/2015  HPI/Events of Note  Poison Control recommends restarting N-Acetylcystine d/t increasing transaminase levels. They are aware that the Tylenol level earlier today was < 2.  eICU Interventions  Will restart N-Acetylcystine per pharmacy consult.      Intervention Category Major Interventions: Other:  Sommer,Steven Cornelia Copa 11/10/2015, 2:00 AM

## 2015-11-10 NOTE — Progress Notes (Signed)
eLink Physician-Brief Progress Note Patient Name: JIANA POPPERT DOB: Jul 13, 1961 MRN: [id number]   Date of Service  11/10/2015  HPI/Events of Note  In aflutter  eICU Interventions  Start cardizem infusion.      Intervention Category Major Interventions: Arrhythmia - evaluation and management  Laverle Hobby 11/10/2015, 8:22 PM

## 2015-11-10 NOTE — Progress Notes (Signed)
MEDICATION RELATED CONSULT NOTE - INITIAL   Pharmacy Consult for acetylcysteine Indication: overdose  No Known Allergies  Patient Measurements: Height: 5\' 6"  (167.6 cm) Weight: 276 lb 3.8 oz (125.3 kg) (bed scale) IBW/kg (Calculated) : 59.3 Adjusted Body Weight:   Vital Signs: Temp: 99 F (37.2 C) (04/12 0100) Temp Source: Rectal (04/12 0000) BP: 100/55 mmHg (04/12 0100) Pulse Rate: 83 (04/12 0100) Intake/Output from previous day: 04/11 0701 - 04/12 0700 In: 2400.7 [I.V.:1899.5; IV Piggyback:501.2] Out: -165 [Urine:135] Intake/Output from this shift: Total I/O In: 694.8 [I.V.:193.5; IV Piggyback:501.2] Out: 30 [Urine:30]  Labs:  Recent Labs  11/08/15 2136 11/09/15 0336 11/09/15 0751 11/09/15 1005 11/09/15 1836 11/09/15 2056  WBC 11.6* 10.8 10.1  --   --   --   HGB 11.6* 10.8* 11.3*  --   --   --   HCT 36.0 33.8* 34.4*  --   --   --   PLT 205 186 196  --   --   --   APTT 29  --   --   --   --   --   CREATININE 2.70* 2.62* 2.95* 3.10* 1.88* 2.17*  MG  --  2.1  --   --  1.9  --   PHOS  --  6.6*  --  5.4* 1.9*  --   ALBUMIN 3.5 2.9* 3.0* 2.9*  --  2.7*  PROT 6.8 5.7* 6.0* 6.1*  --  5.2*  AST 1804* 2132* >2275* >2275*  --  >2275*  ALT 660* 623* 882* 1146*  --  967*  ALKPHOS 140* 130* 135* 132*  --  136*  BILITOT 1.3* 0.5 1.3* 1.3*  --  2.0*  BILIDIR  --   --   --  0.1  --  0.4  IBILI  --   --   --  1.2*  --  1.6*   Estimated Creatinine Clearance: 39.6 mL/min (by C-G formula based on Cr of 2.17).   Microbiology: Recent Results (from the past 720 hour(s))  MRSA PCR Screening     Status: None   Collection Time: 11/09/15  2:09 AM  Result Value Ref Range Status   MRSA by PCR NEGATIVE NEGATIVE Final    Comment:        The GeneXpert MRSA Assay (FDA approved for NASAL specimens only), is one component of a comprehensive MRSA colonization surveillance program. It is not intended to diagnose MRSA infection nor to guide or monitor treatment for MRSA  infections.     Medical History: Past Medical History  Diagnosis Date  . Osteoarthritis   . Osteoporosis   . Rheumatoid arthritis ([place])   . Thyroid disease   . Hypotension   . Constipation   . Anemia   . Bipolar 1 disorder ([place])   . Anxiety   . Fibromyalgia     Medications:  Infusions:  . fentaNYL infusion INTRAVENOUS 250 mcg/hr (11/09/15 1923)  . norepinephrine (LEVOPHED) Adult infusion 7 mcg/min (11/09/15 2322)    Assessment: 65 yof cc overdose/poisoning. Pharmacy consulted to dose acetylcysteine per poison control.  Goal of Therapy:   Plan:  Acetylcysteine 7.5 gm IV Q4H. Will omit loading dose since patient was treated yesterday. Dose capped at 7.5 gm instead of 70 mg/kg due to BMI.   Laural Benes, Pharm.D., BCPS Clinical Pharmacist 11/10/2015,2:33 AM

## 2015-11-10 NOTE — Progress Notes (Signed)
David with Poison Control called with their recommendation to restart acetylcysteine drip. eLink MD made aware of Poison Control's recommendation. Orders for a pharmacy consult given to address restarting acetylcysteine drip. Will continue to monitor.

## 2015-11-10 NOTE — Progress Notes (Signed)
eLink Physician-Brief Progress Note Patient Name: PARALEE SEMMLER DOB: Feb 29, 1961 MRN: [id number]   Date of Service  11/10/2015  HPI/Events of Note  Request for AM lab orders.  eICU Interventions  Will order: 1. CMP, Mg++ and PO4--- now.      Intervention Category Minor Interventions: Clinical assessment - ordering diagnostic tests  Lysle Dingwall 11/10/2015, 5:57 AM

## 2015-11-10 NOTE — Progress Notes (Signed)
Pt remains intubated on 50% FiO2 O2 sats 98 to 100%; spontaneous breathing trial performed today pt tolerated well for several hours than became tachypnic and restless therefore pt placed back on full support pt intermittently followed commands when sedation decreased; vss with levophed infusing; pt received hemodialysis today tolerated well; pt tolerating tube feedings; pts husband updated about plan of care and questions answered will continue to monitor and assess pt

## 2015-11-10 NOTE — Progress Notes (Signed)
[facility]  ROUNDING NOTE   Subjective:  Patient completed hemodialysis yesterday. We have plan for another dialysis treatment today. Remains critically ill at the moment. She remains on the ventilator.   Objective:  Vital signs in last 24 hours:  Temp:  [97.9 F (36.6 C)-100 F (37.8 C)] 98.6 F (37 C) (04/12 0800) Pulse Rate:  [80-92] 85 (04/12 0800) Resp:  [19-29] 21 (04/12 0800) BP: (93-138)/(49-78) 99/55 mmHg (04/12 0800) SpO2:  [97 %-100 %] 97 % (04/12 0820) FiO2 (%):  [28 %-40 %] 28 % (04/12 0820) Weight:  [125.3 kg (276 lb 3.8 oz)] 125.3 kg (276 lb 3.8 oz) (04/11 1415)  Weight change: 4.643 kg (10 lb 3.8 oz) Filed Weights   11/09/15 0200 11/09/15 0945 11/09/15 1415  Weight: 122.4 kg (269 lb 13.5 oz) 125.3 kg (276 lb 3.8 oz) 125.3 kg (276 lb 3.8 oz)    Intake/Output: I/O last 3 completed shifts: In: 5399.2 [I.V.:4723; Other:175; IV Piggyback:501.2] Out: -115 [Urine:185]   Intake/Output this shift:     Physical Exam: General: Critically ill appearing  Head: Normocephalic, atraumatic. Moist oral mucosal membranes  Eyes: Anicteric  Neck: Supple, trachea midline  Lungs:  Clear to auscultation, vent assisted  Heart: S1S2 no rubs  Abdomen:  Soft, nontender, BS present   Extremities: trace peripheral edema.  Neurologic: Intubated, not following commands  Skin: No lesions  Access: Femoral temporary dialysis catheter    Basic Metabolic Panel:  Recent Labs Lab 11/09/15 0336 11/09/15 0751 11/09/15 1005 11/09/15 1836 11/09/15 2056  NA 135 134* 137 139 137  K 4.5 4.6 4.6 2.9* 2.9*  CL 110 107 106 101 100*  CO2 9* 8* 11* 18* 15*  GLUCOSE 133* 136* 120* 67 76  BUN 54* 60* 63* 28* 33*  CREATININE 2.62* 2.95* 3.10* 1.88* 2.17*  CALCIUM 6.5* 6.4* 6.6* 7.3* 7.1*  MG 2.1  --   --  1.9  --   PHOS 6.6*  --  5.4* 1.9*  --     Liver Function Tests:  Recent Labs Lab 11/08/15 2136 11/09/15 0336 11/09/15 0751 11/09/15 1005 11/09/15 2056  AST  1804* 2132* >2275* >2275* >2275*  ALT 660* 623* 882* 1146* 967*  ALKPHOS 140* 130* 135* 132* 136*  BILITOT 1.3* 0.5 1.3* 1.3* 2.0*  PROT 6.8 5.7* 6.0* 6.1* 5.2*  ALBUMIN 3.5 2.9* 3.0* 2.9* 2.7*   No results for input(s): LIPASE, AMYLASE in the last 168 hours.  Recent Labs Lab 11/09/15 0751  AMMONIA 161*    CBC:  Recent Labs Lab 11/08/15 2136 11/09/15 0336 11/09/15 0751 11/10/15 0519  WBC 11.6* 10.8 10.1 2.9*  HGB 11.6* 10.8* 11.3* 9.9*  HCT 36.0 33.8* 34.4* 29.9*  MCV 99.5 100.4* 97.9 95.4  PLT 205 186 196 140*    Cardiac Enzymes:  Recent Labs Lab 11/08/15 1238 11/09/15 0336  TROPONINI 0.07* 0.09*    BNP: Invalid input(s): POCBNP  CBG:  Recent Labs Lab 11/09/15 0158  GLUCAP 106*    Microbiology: Results for orders placed or performed during the hospital encounter of 11/08/15  MRSA PCR Screening     Status: None   Collection Time: 11/09/15  2:09 AM  Result Value Ref Range Status   MRSA by PCR NEGATIVE NEGATIVE Final    Comment:        The GeneXpert MRSA Assay (FDA approved for NASAL specimens only), is one component of a comprehensive MRSA colonization surveillance program. It is not intended to diagnose MRSA infection nor to guide or  monitor treatment for MRSA infections.     Coagulation Studies:  Recent Labs  11/08/15 2136 11/10/15 0519  LABPROT 18.1* 22.2*  INR 1.49 1.96    Urinalysis: No results for input(s): COLORURINE, LABSPEC, PHURINE, GLUCOSEU, HGBUR, BILIRUBINUR, KETONESUR, PROTEINUR, UROBILINOGEN, NITRITE, LEUKOCYTESUR in the last 72 hours.  Invalid input(s): APPERANCEUR    Imaging: Dg Abd 1 View  11/09/2015  CLINICAL DATA:  Abdominal pain EXAM: ABDOMEN - 1 VIEW COMPARISON:  None. FINDINGS: The nasogastric tube he extends into the stomach with tip in the region of the proximal gastric body. There are dilated loops of bowel, incompletely imaged. IMPRESSION: Nasogastric tube reaches the stomach. Electronically Signed   By:  Andreas Newport M.D.   On: 11/09/2015 03:34   Ct Head Wo Contrast  11/08/2015  CLINICAL DATA:  Took 218 Provigil tablets over 5 days. Lethargy, with slurred speech. Initial encounter. EXAM: CT HEAD WITHOUT CONTRAST TECHNIQUE: Contiguous axial images were obtained from the base of the skull through the vertex without intravenous contrast. COMPARISON:  CT of the head performed 05/07/2013, and MRI of the brain performed 08/17/2013 FINDINGS: There is no evidence of acute infarction, mass lesion, or intra- or extra-axial hemorrhage on CT. The posterior fossa, including the cerebellum, brainstem and fourth ventricle, is within normal limits. The third and lateral ventricles, and basal ganglia are unremarkable in appearance. The cerebral hemispheres are symmetric in appearance, with normal gray-white differentiation. No mass effect or midline shift is seen. There is no evidence of fracture; visualized osseous structures are unremarkable in appearance. The visualized portions of the orbits are within normal limits. The paranasal sinuses and mastoid air cells are well-aerated. No significant soft tissue abnormalities are seen. IMPRESSION: Unremarkable noncontrast CT of the head. Electronically Signed   By: Garald Balding M.D.   On: 11/08/2015 23:20   Dg Chest Port 1 View  11/10/2015  CLINICAL DATA:  Respiratory failure, intubated patient EXAM: PORTABLE CHEST 1 VIEW COMPARISON:  Portable chest x-ray of November 09, 2015 FINDINGS: The lungs remain mildly hypoinflated. There is linear increased density at the left lung base. There is no pleural effusion or pneumothorax. The heart is top-normal in size. The central pulmonary vascularity is prominent. The endotracheal tube tip lies approximately 2.2 cm above the carina. The esophagogastric tube tip projects below the inferior margin of the image. The left internal jugular venous catheter tip projects over the junction of the middle and distal thirds of the SVC. IMPRESSION:  Persistent mild central pulmonary vascular congestion. Minimal left lower lobe atelectasis. The endotracheal tube tip now lies approximately 2.2 cm above the carina. Electronically Signed   By: David  Martinique M.D.   On: 11/10/2015 07:11   Dg Chest Port 1 View  11/09/2015  CLINICAL DATA:  Endotracheal tube and orogastric tube placement. Left-sided central line placement. Altered mental status. Initial encounter. EXAM: PORTABLE CHEST 1 VIEW COMPARISON:  Chest radiograph performed 08/16/2013 FINDINGS: The patient's endotracheal tube is seen ending 1-2 cm above the carina. This could be retracted 1-2 cm. An enteric tube is noted extending below the diaphragm. A left IJ line is noted ending about the distal SVC. Mild vascular congestion is noted. Mild patchy left-sided airspace opacity could reflect mild pneumonia or possibly asymmetric interstitial edema. No pleural effusion or pneumothorax is seen. The cardiomediastinal silhouette is borderline enlarged. No acute osseous abnormalities are identified. IMPRESSION: 1. Endotracheal tube seen ending 1-2 cm above the carina. This could be retracted 1-2 cm. 2. Enteric tube noted extending below the  diaphragm. 3. Left IJ line noted ending about the distal SVC. 4. Mild vascular congestion and borderline cardiomegaly. Mild patchy left-sided airspace opacity could reflect mild pneumonia or possibly asymmetric interstitial edema. Electronically Signed   By: Garald Balding M.D.   On: 11/09/2015 03:38     Medications:   . fentaNYL infusion INTRAVENOUS 50 mcg/hr (11/10/15 0800)  . norepinephrine (LEVOPHED) Adult infusion 11 mcg/min (11/10/15 0800)   . acetylcysteine  7,500 mg Per Tube Q4H  . antiseptic oral rinse  7 mL Mouth Rinse QID  . chlorhexidine gluconate (SAGE KIT)  15 mL Mouth Rinse BID  . feeding supplement (VITAL HIGH PROTEIN)  1,000 mL Per Tube Q24H  . small volume/piggyback builder   Intravenous Q4H  . fomepizole (ANTIZOL) IV  10 mg/kg Intravenous Q12H  .  heparin  5,000 Units Subcutaneous 3 times per day  . levothyroxine  112 mcg Oral QAC breakfast  . pantoprazole sodium  40 mg Per Tube Q1200  . pyridOXINE  100 mg Intravenous Q6H  . sodium chloride flush  10-40 mL Intracatheter Q12H  . sodium chloride flush  3 mL Intravenous Q12H  . thiamine IV  100 mg Intravenous Q6H   sodium chloride, sodium chloride, alteplase, fentaNYL, heparin, lidocaine (PF), lidocaine-prilocaine, pentafluoroprop-tetrafluoroeth, sodium chloride flush  Assessment/ Plan:  55 y.o. female with a PMHx of osteoarthritis, osteoporosis, rheumatoid arthritis, bipolar disorder, anxiety, fibromyalgia, status post gastric bypass, who was admitted to [facility] on 11/08/2015 for evaluation of altered mental status. Patient apparently took many of her outpatient prescriptions over the past 4-5 days. She may have possibly also overdosed on Provigil.  1. Metabolic acidosis  2. Acute renal failure. 3. drug overdose, patient apparently took provigil, elevated osmolar gap of 32. Volatile acid screen positive for acetone.  4. Acute respiratory failure.   Plan:  Patient seen at bedside this a.m. We are planning for another session of hemodialysis today. ABG was rechecked yesterday which showed a pH of 7.5. However she is at risk for redeveloping acidosis. Continue ventilatory support for now as per pulmonary critical care. Volatile screen was performed which was largely negative with the exception of acetone being present. However it appears ethylene glycol Poison control is on board however. We will continue to monitor her progress closely.   LOS: 2 Avni Traore 4/12/20179:10 AM

## 2015-11-10 NOTE — Progress Notes (Signed)
Post dialysis assessment 

## 2015-11-10 NOTE — Progress Notes (Signed)
Treatment initiation 

## 2015-11-10 NOTE — Progress Notes (Addendum)
PULMONARY / CRITICAL CARE MEDICINE   Name: SALSABIL SHARPE MRN: [id number] DOB: Dec 16, 1960    ADMISSION DATE:  11/08/2015  PT PROFILE:   8 F adm via ED with altered mental status, history of bipolar disorder. She has a prescription for Provigil. Over the last 4-5 days she took majority of her monthly prescription meds. She was lethargic and increased WOB upon initial assessment and intubated. Intubated, vasopressor dependent shock, AKI (oliguric), severe acidosis, acute hepatic failure. Husband feels strongly that she took no other ingestions and that this was not with suicidal intent  MAJOR EVENTS/TEST RESULTS: 04/11 Admitted as above, intubated, vasopressors, CVL and HD cath placed, HD performed, HCO3 gtt, fomipazole and NAC administered per poison control even though acetaminophen levels undetectable X 2. Volatile alcohols negative. Serum acetone level markedly elevated 04/12 Much improved. Acidosis and LFTs improving. Remains oliguric, HD repeated. Fomipazole stopped. Weaning norepinephrine. Tolerating PSV mode   INDWELLING DEVICES:: ETT 04/11 >>  L IJ CVL 04/11 >> R femoral HD cath 04/11 >>  MICRO DATA: MRSA PCR 04/11 >> NEG Resp 04/12 >>   ANTIMICROBIALS:  Ceftriaxone 04/12 >> PCT algorithm 04/12: 12.13,   SUBJECTIVE:  RASS -1 on WUA. Tolerating PSV mode of vent. Moderate light brown creamy ET secretions. Not F/C  VITAL SIGNS: BP 91/56 mmHg  Pulse 97  Temp(Src) 99.5 F (37.5 C) (Rectal)  Resp 25  Ht 5\' 6"  (1.676 m)  Wt 129.6 kg (285 lb 11.5 oz)  BMI 46.14 kg/m2  SpO2 96%  HEMODYNAMICS: CVP:  [8 mmHg-10 mmHg] 10 mmHg  VENTILATOR SETTINGS: Vent Mode:  [-] Spontaneous FiO2 (%):  [28 %-40 %] 28 % Set Rate:  [16 bmp] 16 bmp PEEP:  [5 cmH20] 5 cmH20 Pressure Support:  [10 cmH20] 10 cmH20  INTAKE / OUTPUT: I/O last 3 completed shifts: In: 5399.2 [I.V.:4723; Other:175; IV Piggyback:501.2] Out: -115 [Urine:185]  PHYSICAL EXAMINATION: General: Appears older  than her true age, intubated, sedated Neuro: PERRL, EOMI, MAEs, DTRs symmetric HEENT: NCAT, WNL Cardiovascular: reg, no M noted Lungs: scattered rhonchi Abdomen: obese, soft, +BS Ext: warm, no edema  LABS:  BMET  Recent Labs Lab 11/09/15 1836 11/09/15 2056 11/10/15 0617  NA 139 137 137  K 2.9* 2.9* 3.2*  CL 101 100* 101  CO2 18* 15* 14*  BUN 28* 33* 41*  CREATININE 1.88* 2.17* 2.70*  GLUCOSE 67 76 69    Electrolytes  Recent Labs Lab 11/09/15 0336  11/09/15 1005 11/09/15 1836 11/09/15 2056 11/10/15 0617  CALCIUM 6.5*  < > 6.6* 7.3* 7.1* 6.8*  MG 2.1  --   --  1.9  --  2.2  PHOS 6.6*  --  5.4* 1.9*  --  3.2  < > = values in this interval not displayed.  CBC  Recent Labs Lab 11/09/15 0336 11/09/15 0751 11/10/15 0519  WBC 10.8 10.1 2.9*  HGB 10.8* 11.3* 9.9*  HCT 33.8* 34.4* 29.9*  PLT 186 196 140*    Coag's  Recent Labs Lab 11/08/15 2136 11/10/15 0519  APTT 29 35  INR 1.49 1.96    Sepsis Markers  Recent Labs Lab 11/09/15 0336 11/09/15 1836 11/09/15 2056 11/10/15 0617  LATICACIDVEN 1.6 1.4 1.1  --   PROCALCITON  --   --   --  12.13    ABG  Recent Labs Lab 11/09/15 0359 11/09/15 0526 11/09/15 1500  PHART 6.94* 6.99* 7.54*  PCO2ART 32 32 26*  PO2ART 64* 134* 204*    Liver Enzymes  Recent Labs  Lab 11/09/15 1005 11/09/15 2056 11/10/15 0617  AST >2275* >2275* 1493*  ALT 1146* 967* 821*  ALKPHOS 132* 136* 138*  BILITOT 1.3* 2.0* 2.1*  ALBUMIN 2.9* 2.7* 2.6*    Cardiac Enzymes  Recent Labs Lab 11/08/15 1238 11/09/15 0336  TROPONINI 0.07* 0.09*    Glucose  Recent Labs Lab 11/09/15 0158 11/10/15 0958 11/10/15 1154  GLUCAP 106* 70 81    CXR: minimal bibasilar atx vs inf    ASSESSMENT / PLAN:  PULMONARY A: VDRF - intubated predominantly due to AMS and increased WOB due to acidosis P:   Cont vent support - settings reviewed and/or adjusted Wean in PSV mode as tolerated Cont vent bundle Daily SBT  if/when meets criteria   CARDIOVASCULAR A:  Shock - unclear etiology. Improving P:  MAP goal > 65 mmHg Cont norepinephrine  RENAL A:   AKI, oliguric Severe metabolic acidosis - unclear etiology Hypokalemia P:   Monitor BMET intermittently Monitor I/Os Correct electrolytes as indicated Repeat HD 04/12  GASTROINTESTINAL A:   Elevated LFTs - improving. Suspect toxic injury P:   SUP: enteral PPI Begin TFs 04/12  HEMATOLOGIC A:   Mild thrombocytopenia Coagulopathy - likely due to hepatic failure P:  DVT px: SQ heparin Monitor CBC intermittently Transfuse per usual guidelines Monitor plt on heprin  INFECTIOUS A:   Purulent TCB vs PNA Elevated PCT P:   Monitor temp, WBC count Micro and abx as above  ENDOCRINE A:   Hypothyroidism P:   Change levothyroxine to enteral 04/12 Check TSH AM 04/13 Monitor CBGs on TFs Consider SSI if glu > 180  NEUROLOGIC A:   Acute toxic-metabolic encephalopathy ICU associated discomfort P:   RASS goal: 0, -1 Continue fentanyl gtt   FAMILY  - Updates: husband updated @ bedside  CCM time: 45 mins The above time includes time spent in consultation with patient and/or family members and reviewing care plan on multidisciplinary rounds  Merton Border, MD PCCM service Mobile [phone] Pager [phone] 11/10/2015

## 2015-11-11 ENCOUNTER — Inpatient Hospital Stay: Payer: Medicare HMO

## 2015-11-11 DIAGNOSIS — R410 Disorientation, unspecified: Secondary | ICD-10-CM

## 2015-11-11 DIAGNOSIS — J1520 Pneumonia due to staphylococcus, unspecified: Secondary | ICD-10-CM

## 2015-11-11 LAB — BASIC METABOLIC PANEL
Anion gap: 15 (ref 5–15)
BUN: 38 mg/dL — ABNORMAL HIGH (ref 6–20)
CO2: 23 mmol/L (ref 22–32)
Calcium: 7.1 mg/dL — ABNORMAL LOW (ref 8.9–10.3)
Chloride: 103 mmol/L (ref 101–111)
Creatinine, Ser: 2.55 mg/dL — ABNORMAL HIGH (ref 0.44–1.00)
GFR calc Af Amer: 23 mL/min — ABNORMAL LOW (ref >60)
GFR calc non Af Amer: 20 mL/min — ABNORMAL LOW (ref >60)
Glucose, Bld: 110 mg/dL — ABNORMAL HIGH (ref 65–99)
Potassium: 3.3 mmol/L — ABNORMAL LOW (ref 3.5–5.1)
Sodium: 141 mmol/L (ref 135–145)

## 2015-11-11 LAB — OSMOLALITY: Osmolality: 307 mOsm/kg — ABNORMAL HIGH (ref 275–295)

## 2015-11-11 LAB — PROTIME-INR
INR: 1.74
Prothrombin Time: 20.3 seconds — ABNORMAL HIGH (ref 11.4–15.0)

## 2015-11-11 LAB — CBC
HCT: 28.7 % — ABNORMAL LOW (ref 35.0–47.0)
Hemoglobin: 9.9 g/dL — ABNORMAL LOW (ref 12.0–16.0)
MCH: 32.4 pg (ref 26.0–34.0)
MCHC: 34.4 g/dL (ref 32.0–36.0)
MCV: 94.1 fL (ref 80.0–100.0)
Platelets: 114 10*3/uL — ABNORMAL LOW (ref 150–440)
RBC: 3.05 MIL/uL — ABNORMAL LOW (ref 3.80–5.20)
RDW: 16.1 % — ABNORMAL HIGH (ref 11.5–14.5)
WBC: 2.5 10*3/uL — ABNORMAL LOW (ref 3.6–11.0)

## 2015-11-11 LAB — HEPATIC FUNCTION PANEL
ALT: 824 U/L — ABNORMAL HIGH (ref 14–54)
AST: 979 U/L — ABNORMAL HIGH (ref 15–41)
Albumin: 2.4 g/dL — ABNORMAL LOW (ref 3.5–5.0)
Alkaline Phosphatase: 172 U/L — ABNORMAL HIGH (ref 38–126)
Bilirubin, Direct: 0.4 mg/dL (ref 0.1–0.5)
Indirect Bilirubin: 0.6 mg/dL (ref 0.3–0.9)
Total Bilirubin: 1 mg/dL (ref 0.3–1.2)
Total Protein: 5.3 g/dL — ABNORMAL LOW (ref 6.5–8.1)

## 2015-11-11 LAB — BLOOD CULTURE ID PANEL (REFLEXED)
Acinetobacter baumannii: NOT DETECTED
Candida albicans: NOT DETECTED
Candida glabrata: NOT DETECTED
Candida krusei: NOT DETECTED
Candida parapsilosis: NOT DETECTED
Candida tropicalis: NOT DETECTED
Carbapenem resistance: NOT DETECTED
Enterobacter cloacae complex: NOT DETECTED
Enterobacteriaceae species: NOT DETECTED
Enterococcus species: NOT DETECTED
Escherichia coli: NOT DETECTED
Haemophilus influenzae: NOT DETECTED
Klebsiella oxytoca: NOT DETECTED
Klebsiella pneumoniae: NOT DETECTED
Listeria monocytogenes: NOT DETECTED
Methicillin resistance: DETECTED — AB
Neisseria meningitidis: NOT DETECTED
Proteus species: NOT DETECTED
Pseudomonas aeruginosa: NOT DETECTED
Serratia marcescens: NOT DETECTED
Staphylococcus aureus (BCID): NOT DETECTED
Staphylococcus species: DETECTED — AB
Streptococcus agalactiae: NOT DETECTED
Streptococcus pneumoniae: NOT DETECTED
Streptococcus pyogenes: NOT DETECTED
Streptococcus species: NOT DETECTED
Vancomycin resistance: NOT DETECTED

## 2015-11-11 LAB — MAGNESIUM: Magnesium: 2 mg/dL (ref 1.7–2.4)

## 2015-11-11 LAB — ETHYLENE GLYCOL: Ethylene Glycol Lvl: NOT DETECTED mg/dL

## 2015-11-11 LAB — GLUCOSE, CAPILLARY
Glucose-Capillary: 103 mg/dL — ABNORMAL HIGH (ref 65–99)
Glucose-Capillary: 85 mg/dL (ref 65–99)

## 2015-11-11 LAB — TSH: TSH: 0.147 u[IU]/mL — ABNORMAL LOW (ref 0.350–4.500)

## 2015-11-11 LAB — APTT: aPTT: 37 seconds — ABNORMAL HIGH (ref 24–36)

## 2015-11-11 LAB — PROCALCITONIN: Procalcitonin: 28.57 ng/mL

## 2015-11-11 MED ORDER — PHENYLEPHRINE HCL 10 MG/ML IJ SOLN
0.0000 ug/min | INTRAVENOUS | Status: DC
  Administered 2015-11-11: 10 ug/min via INTRAVENOUS
  Administered 2015-11-12: 40 ug/min via INTRAVENOUS
  Filled 2015-11-11 (×2): qty 4

## 2015-11-11 MED ORDER — METOPROLOL TARTRATE 1 MG/ML IV SOLN
5.0000 mg | Freq: Once | INTRAVENOUS | Status: AC
  Administered 2015-11-11: 5 mg via INTRAVENOUS
  Filled 2015-11-11: qty 5

## 2015-11-11 MED ORDER — VANCOMYCIN HCL 10 G IV SOLR
1250.0000 mg | INTRAVENOUS | Status: DC
  Administered 2015-11-12: 1250 mg via INTRAVENOUS
  Filled 2015-11-11: qty 1250

## 2015-11-11 MED ORDER — POTASSIUM CHLORIDE 20 MEQ/15ML (10%) PO SOLN
40.0000 meq | Freq: Once | ORAL | Status: AC
  Administered 2015-11-11: 40 meq
  Filled 2015-11-11: qty 30

## 2015-11-11 MED ORDER — PHENYLEPHRINE HCL 10 MG/ML IJ SOLN: 0.0000 ug/min | INTRAVENOUS | Status: DC

## 2015-11-11 MED ORDER — BISACODYL 10 MG RE SUPP
10.0000 mg | Freq: Once | RECTAL | Status: AC
  Administered 2015-11-11: 10 mg via RECTAL

## 2015-11-11 MED ORDER — FENTANYL CITRATE (PF) 100 MCG/2ML IJ SOLN
25.0000 ug | INTRAMUSCULAR | Status: DC | PRN
Start: 2015-11-11 — End: 2015-11-11

## 2015-11-11 MED ORDER — FENTANYL CITRATE (PF) 100 MCG/2ML IJ SOLN
12.5000 ug | INTRAMUSCULAR | Status: DC | PRN
  Administered 2015-11-11 – 2015-11-12 (×2): 25 ug via INTRAVENOUS
  Filled 2015-11-11 (×2): qty 2

## 2015-11-11 MED ORDER — FENTANYL CITRATE (PF) 100 MCG/2ML IJ SOLN
12.5000 ug | INTRAMUSCULAR | Status: DC | PRN
Start: 2015-11-11 — End: 2015-11-11

## 2015-11-11 MED ORDER — BISACODYL 10 MG RE SUPP
10.0000 mg | Freq: Every day | RECTAL | Status: DC | PRN
  Administered 2015-11-12: 10 mg via RECTAL
  Filled 2015-11-11 (×2): qty 1

## 2015-11-11 MED ORDER — VANCOMYCIN HCL 10 G IV SOLR
1250.0000 mg | Freq: Once | INTRAVENOUS | Status: AC
  Administered 2015-11-11: 1250 mg via INTRAVENOUS
  Filled 2015-11-11: qty 1250

## 2015-11-11 MED ORDER — POTASSIUM CHLORIDE 10 MEQ/50ML IV SOLN
10.0000 meq | INTRAVENOUS | Status: AC
  Administered 2015-11-11 (×3): 10 meq via INTRAVENOUS
  Filled 2015-11-11 (×3): qty 50

## 2015-11-11 NOTE — Progress Notes (Signed)
[facility]  ROUNDING NOTE   Subjective:  Last HD on Apr 12 Remains critically ill at the moment. She remains on the ventilator. Requiring pressors Was able to follow simple commands earlier when sedation was decreased Plans for possible extubation today   Objective:  Vital signs in last 24 hours:  Temp:  [98.1 F (36.7 C)-101.7 F (38.7 C)] 98.2 F (36.8 C) (04/13 0930) Pulse Rate:  [66-107] 70 (04/13 0930) Resp:  [14-25] 19 (04/13 0930) BP: (75-111)/(46-69) 103/60 mmHg (04/13 0930) SpO2:  [87 %-99 %] 95 % (04/13 0930) FiO2 (%):  [30 %-50 %] 30 % (04/13 0900) Weight:  [126.1 kg (278 lb)-129.6 kg (285 lb 11.5 oz)] 126.1 kg (278 lb) (04/13 0626)  Weight change: 4.3 kg (9 lb 7.7 oz) Filed Weights   11/10/15 0945 11/10/15 1345 11/11/15 0626  Weight: 129.6 kg (285 lb 11.5 oz) 129.6 kg (285 lb 11.5 oz) 126.1 kg (278 lb)    Intake/Output: I/O last 3 completed shifts: In: 2307.8 [I.V.:793.9; NG/GT:862.7; IV Piggyback:651.2] Out: -33 [Urine:150]   Intake/Output this shift:  Total I/O In: 0  Out: 20 [Urine:20]  Physical Exam: General: Critically ill appearing  Head: Normocephalic, atraumatic.  ETT  Eyes: Anicteric  Neck: Supple, trachea midline  Lungs:  Clear to auscultation, vent assisted  Heart: S1S2 no rubs  Abdomen:  Soft, nontender, BS present   Extremities: ++ peripheral edema.  Neurologic: Intubated, sedated  Skin: No acute lesions  Access: Femoral temporary dialysis catheter    Basic Metabolic Panel:  Recent Labs Lab 11/09/15 0336  11/09/15 1005 11/09/15 1836 11/09/15 2056 11/10/15 0617 11/10/15 1743 11/11/15 0419  NA 135  < > 137 139 137 137 138 141  K 4.5  < > 4.6 2.9* 2.9* 3.2* 3.6 3.3*  CL 110  < > 106 101 100* 101 100* 103  CO2 9*  < > 11* 18* 15* 14* 21* 23  GLUCOSE 133*  < > 120* 67 76 69 79 110*  BUN 54*  < > 63* 28* 33* 41* 23* 38*  CREATININE 2.62*  < > 3.10* 1.88* 2.17* 2.70* 1.83* 2.55*  CALCIUM 6.5*  < > 6.6* 7.3* 7.1*  6.8* 7.1* 7.1*  MG 2.1  --   --  1.9  --  2.2  --  2.0  PHOS 6.6*  --  5.4* 1.9*  --  3.2  --   --   < > = values in this interval not displayed.  Liver Function Tests:  Recent Labs Lab 11/09/15 1005 11/09/15 2056 11/10/15 0617 11/10/15 1743 11/11/15 0419  AST >2275* >2275* 1493* 1210* 979*  ALT 1146* 967* 821* 843* 824*  ALKPHOS 132* 136* 138* 167* 172*  BILITOT 1.3* 2.0* 2.1* 1.6* 1.0  PROT 6.1* 5.2* 5.2* 5.4* 5.3*  ALBUMIN 2.9* 2.7* 2.6* 2.6* 2.4*   No results for input(s): LIPASE, AMYLASE in the last 168 hours.  Recent Labs Lab 11/09/15 0751  AMMONIA 161*    CBC:  Recent Labs Lab 11/08/15 2136 11/09/15 0336 11/09/15 0751 11/10/15 0519 11/11/15 0419  WBC 11.6* 10.8 10.1 2.9* 2.5*  HGB 11.6* 10.8* 11.3* 9.9* 9.9*  HCT 36.0 33.8* 34.4* 29.9* 28.7*  MCV 99.5 100.4* 97.9 95.4 94.1  PLT 205 186 196 140* 114*    Cardiac Enzymes:  Recent Labs Lab 11/08/15 1238 11/09/15 0336  TROPONINI 0.07* 0.09*    BNP: Invalid input(s): POCBNP  CBG:  Recent Labs Lab 11/09/15 0158 11/10/15 0958 11/10/15 1154 11/10/15 1631 11/11/15 0762  GLUCAP 106* 49 81 68 103*    Microbiology: Results for orders placed or performed during the hospital encounter of 11/08/15  MRSA PCR Screening     Status: None   Collection Time: 11/09/15  2:09 AM  Result Value Ref Range Status   MRSA by PCR NEGATIVE NEGATIVE Final    Comment:        The GeneXpert MRSA Assay (FDA approved for NASAL specimens only), is one component of a comprehensive MRSA colonization surveillance program. It is not intended to diagnose MRSA infection nor to guide or monitor treatment for MRSA infections.   Culture, expectorated sputum-assessment     Status: None   Collection Time: 11/10/15  8:30 AM  Result Value Ref Range Status   Specimen Description ENDOTRACHEAL  Final   Special Requests NONE  Final   Sputum evaluation THIS SPECIMEN IS ACCEPTABLE FOR SPUTUM CULTURE  Final   Report Status  11/10/2015 FINAL  Final  Culture, respiratory (NON-Expectorated)     Status: None (Preliminary result)   Collection Time: 11/10/15  8:30 AM  Result Value Ref Range Status   Specimen Description ENDOTRACHEAL  Final   Special Requests NONE Reflexed from [id number]  Final   Gram Stain   Final    FEW WBC SEEN MANY GRAM POSITIVE COCCI RARE YEAST    Culture   Final    HEAVY GROWTH STAPHYLOCOCCUS AUREUS SUSCEPTIBILITIES TO FOLLOW    Report Status PENDING  Incomplete  CULTURE, BLOOD (ROUTINE X 2) w Reflex to PCR ID Panel     Status: None (Preliminary result)   Collection Time: 11/10/15  8:47 PM  Result Value Ref Range Status   Specimen Description BLOOD RIGHT HAND  Final   Special Requests BOTTLES DRAWN AEROBIC AND ANAEROBIC 5ML  Final   Culture NO GROWTH < 12 HOURS  Final   Report Status PENDING  Incomplete  CULTURE, BLOOD (ROUTINE X 2) w Reflex to PCR ID Panel     Status: None (Preliminary result)   Collection Time: 11/10/15  9:08 PM  Result Value Ref Range Status   Specimen Description BLOOD LEFT HAND  Final   Special Requests BOTTLES DRAWN AEROBIC AND ANAEROBIC 5ML  Final   Culture NO GROWTH < 12 HOURS  Final   Report Status PENDING  Incomplete    Coagulation Studies:  Recent Labs  11/08/15 2136 11/10/15 0519 11/11/15 0419  LABPROT 18.1* 22.2* 20.3*  INR 1.49 1.96 1.74    Urinalysis: No results for input(s): COLORURINE, LABSPEC, PHURINE, GLUCOSEU, HGBUR, BILIRUBINUR, KETONESUR, PROTEINUR, UROBILINOGEN, NITRITE, LEUKOCYTESUR in the last 72 hours.  Invalid input(s): APPERANCEUR    Imaging: Dg Chest Port 1 View  11/11/2015  CLINICAL DATA:  Respiratory failure. EXAM: PORTABLE CHEST 1 VIEW COMPARISON:  11/10/2015. FINDINGS: Endotracheal tube in stable position. Left IJ line noted, its tip is appears to be coiled in the SVC. Bibasilar atelectasis and/or infiltrates noted, slightly progressed from prior exam. No pleural effusion pneumothorax. Heart size normal. No pneumothorax.  IMPRESSION: 1. Endotracheal tube in stable position. Left IJ line tip appears to be coiled in the SVC. 2. Low lung volumes with interim slight progression of bibasilar atelectasis and/or infiltrates. Electronically Signed   By: Marcello Moores  Register   On: 11/11/2015 07:33   Dg Chest Port 1 View  11/10/2015  CLINICAL DATA:  Respiratory failure, intubated patient EXAM: PORTABLE CHEST 1 VIEW COMPARISON:  Portable chest x-ray of November 09, 2015 FINDINGS: The lungs remain mildly hypoinflated. There is linear increased density at the  left lung base. There is no pleural effusion or pneumothorax. The heart is top-normal in size. The central pulmonary vascularity is prominent. The endotracheal tube tip lies approximately 2.2 cm above the carina. The esophagogastric tube tip projects below the inferior margin of the image. The left internal jugular venous catheter tip projects over the junction of the middle and distal thirds of the SVC. IMPRESSION: Persistent mild central pulmonary vascular congestion. Minimal left lower lobe atelectasis. The endotracheal tube tip now lies approximately 2.2 cm above the carina. Electronically Signed   By: David  Martinique M.D.   On: 11/10/2015 07:11     Medications:   . norepinephrine (LEVOPHED) Adult infusion 8 mcg/min (11/11/15 0800)   . antiseptic oral rinse  7 mL Mouth Rinse QID  . cefTRIAXone (ROCEPHIN)  IV  1 g Intravenous Q24H  . chlorhexidine gluconate (SAGE KIT)  15 mL Mouth Rinse BID  . heparin  5,000 Units Subcutaneous 3 times per day  . levothyroxine  112 mcg Oral QAC breakfast  . sodium chloride flush  10-40 mL Intracatheter Q12H  . sodium chloride flush  3 mL Intravenous Q12H   sodium chloride, sodium chloride, alteplase, fentaNYL (SUBLIMAZE) injection, heparin, lidocaine (PF), lidocaine-prilocaine, pentafluoroprop-tetrafluoroeth, sodium chloride flush  Assessment/ Plan:  55 y.o. female with a PMHx of osteoarthritis, osteoporosis, rheumatoid arthritis, bipolar  disorder, anxiety, fibromyalgia, status post gastric bypass, who was admitted to [facility] on 11/08/2015 for evaluation of altered mental status. Patient apparently took many of her outpatient prescriptions over the past 4-5 days. She may have possibly also overdosed on Provigil.  1. Metabolic acidosis  2. Acute renal failure. 3. drug overdose, patient apparently took provigil, elevated osmolar gap of 32. Volatile acid screen positive for acetone.  4. Acute respiratory failure.  5.  Acute hepatic failure  Plan:  Patient continues to remain critically ill, vent supported, requiring Pressors. UOP remains low.  - Hold off further HD and observe UOP - Trial of lasix once BP is consistently in normal range   LOS: 3 Jamie Bennett 4/13/20179:53 AM

## 2015-11-11 NOTE — Progress Notes (Signed)
[place] Progress Note Patient Name: Jamie Bennett DOB: 02-17-1961 MRN: [id number]   Date of Service  11/11/2015  HPI/Events of Note  Pharmacist calls as blood culture growing Staph, likely MRSA  eICU Interventions  Will start vancomycin. Pharmacy aware.      Intervention Category Intermediate Interventions: Other:  [place] 11/11/2015, 8:45 PM

## 2015-11-11 NOTE — Progress Notes (Signed)
Rhythm change from SR to A-Fib.  E-link MD aware.  Awaiting new orders.  Will continue to monitor.

## 2015-11-11 NOTE — Progress Notes (Addendum)
Pharmacy Antibiotic Follow-up Note  Jamie Bennett is a 55 y.o. year-old female admitted on 11/08/2015.  The patient is currently on day 2 of Ceftriaxone for respiratory infection.  Assessment/Plan: Vancomycin 1250  IV every 24 hours.  Goal trough 15-20 mcg/mL.  Temp (24hrs), Avg:98.8 F (37.1 C), Min:98.1 F (36.7 C), Max:100.6 F (38.1 C)   Recent Labs Lab 11/08/15 2136 11/09/15 0336 11/09/15 0751 11/10/15 0519 11/11/15 0419  WBC 11.6* 10.8 10.1 2.9* 2.5*    Recent Labs Lab 11/09/15 1836 11/09/15 2056 11/10/15 0617 11/10/15 1743 11/11/15 0419  CREATININE 1.88* 2.17* 2.70* 1.83* 2.55*   Estimated Creatinine Clearance: 33.8 mL/min (by C-G formula based on Cr of 2.55).    No Known Allergies  Antimicrobials this admission:   >>    >>   Levels/dose changes this admission:   Microbiology results:  BCx: 1 bottle growing staph species ( mech A ) detected.   UCx:    Sputum:    MRSA PCR:   Thank you for allowing pharmacy to be a part of this patient's care.  Joseeduardo Brix D PharmD 11/11/2015 10:01 PM

## 2015-11-11 NOTE — Progress Notes (Signed)
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 Being addressed via TF  GOAL:   Patient will meet greater than or equal to 90% of their needs  MONITOR:   Diet advancement, Weight trends, Labs  REASON FOR ASSESSMENT:   Consult Enteral/tube feeding initiation and management  ASSESSMENT:   55 yo female admitted with acute metabolic encephalopathy with severe metabolic acidosis s/p drug overdose with multi organ failure requiring intubation and  initiation of HD  Pt remains on vent with plan for extubation today, receiving daily intermittent HD with no UF, plan to hold HD today, UOP 70 mL in past 24 hours, levophed at 7 mcg/min with plan to transition to neosynephrine  Diet Order: NPO  EN: tolerated Vital High Protein at rate of 55 ml/hr, Prostat BID; TF orders discontinued for planned extubation  Digestive System: abdomen obese/soft, BS faint/hypoactive  Skin:  Reviewed, no issues  Last BM:  no BM since admission; hx of chronic constipation  Labs: potassium 3.3 (supplemented), magnesium wdl  Meds: potassium chloride, levophed, fentanyl  Height:   Ht Readings from Last 1 Encounters:  11/09/15 5\' 6"  (1.676 m)    Weight:   Wt Readings from Last 1 Encounters:  11/11/15 278 lb (126.1 kg)    Filed Weights   11/10/15 0945 11/10/15 1345 11/11/15 0626  Weight: 285 lb 11.5 oz (129.6 kg) 285 lb 11.5 oz (129.6 kg) 278 lb (126.1 kg)    BMI:  Body mass index is 44.89 kg/(m^2).  Estimated Nutritional Needs:   Kcal:  2000-2435 kcals   Protein:  90-118 g   Fluid:  >2 L  EDUCATION NEEDS:   Education needs no appropriate at this time  [place], [place], [place] [phone] Pager  [phone] Weekend/On-Call Pager

## 2015-11-11 NOTE — Plan of Care (Signed)
Problem: Phase I Progression Outcomes Goal: Dyspnea controlled at rest Outcome: Progressing Extubated to 2L/[place]. Good sats. Lungs with few rhonchi on right..  Left clear Goal: Uremic symptoms managed Outcome: Progressing Liver function and renal lab progressing. Dr Candiss Norse decided no HD today.  Lethargic. Follows some commands. Not speaking much. Moves left arm more than right. MD aware.  Goal: Pain controlled with appropriate interventions Outcome: Progressing Off fentanyl gtt when extubated. Occassional moans Goal: OOB as tolerated unless otherwise ordered Outcome: Not Progressing Chair position most of day.  Pt is too drowsy, weak  and inconsistent with following commands to get OOB. Consider PT eval and treat if more alert tomorrow Goal: Initial discharge plan identified Outcome: Progressing Likely need rehab prior to discharge home for deconditioning. May need to consider psych eval for med management Goal: Hemodynamically stable Outcome: Progressing Weaned off Levophed and switched to neosynephrine for pressor support.  Fewer PAC's after switch made.  No problems with Afib RVR today Goal: Pt./family verbalizes plan of care Outcome: Progressing Husband keeps up with pt care and has spoken with DR Alva Garnet today. Questions have been answered.  Problem: Phase II Progression Outcomes Goal: Urinary output increased Outcome: Progressing UOP improved some today 259ml output this 12 hrs.  Trialysis cath intact and, packed with heparin and capped Goal: Tolerating diet Outcome: Not Progressing NPO. TF held and OGT removed at time of extubation.  Will evaluate swallow tomorrow and consider SLT consult for swallow when pt hopefully more alert. Gastric bypass 20 yrs ago.  Dulcolax suppository given without result

## 2015-11-11 NOTE — Progress Notes (Signed)
PULMONARY / CRITICAL CARE MEDICINE   Name: Jamie Bennett MRN: [id number] DOB: [birth year]    ADMISSION DATE:  11/08/2015  PT PROFILE:   52 F adm via ED with altered mental status, history of bipolar disorder. She has a prescription for Provigil. Over the last 4-5 days she took majority of her monthly prescription meds. She was lethargic and increased WOB upon initial assessment and intubated. Intubated, vasopressor dependent shock, AKI (oliguric), severe acidosis, acute hepatic failure. Husband feels strongly that she took no other ingestions and that this was not with suicidal intent  MAJOR EVENTS/TEST RESULTS: 04/11 Admitted as above, intubated, vasopressors, CVL and HD cath placed, HD performed, HCO3 gtt, fomipazole and NAC administered per poison control even though acetaminophen levels undetectable X 2. Volatile alcohols negative. Serum acetone level markedly elevated 04/12 Much improved. Acidosis and LFTs improving. Remains oliguric, HD repeated. Fomipazole stopped. Weaning norepinephrine. Tolerating PSV mode 04/13 very brief episode of AFRVR. Frequent PACs. Norepi changed to phenylephrine. Extubated and tolerating. Uo improving  INDWELLING DEVICES:: ETT 04/11 >> 04/13 L IJ CVL 04/11 >>  R femoral HD cath 04/11 >>   MICRO DATA: MRSA PCR 04/11 >> NEG Resp 04/12 >> Staph aureus >>   ANTIMICROBIALS:  Ceftriaxone 04/12 >>  PCT algorithm 04/12: 12.13, 28.57,   SUBJECTIVE:  RASS -1 on WUA. Weakly and inconsistently F/C. Passed SBT and extubated. Tolerating extubation on multiple rechecks throughout day  VITAL SIGNS: BP 101/66 mmHg  Pulse 95  Temp(Src) 98.6 F (37 C) (Rectal)  Resp 23  Ht 5\' 6"  (1.676 m)  Wt 126.1 kg (278 lb)  BMI 44.89 kg/m2  SpO2 95%  HEMODYNAMICS:    VENTILATOR SETTINGS: Vent Mode:  [-] PCV FiO2 (%):  [30 %-40 %] 30 % Set Rate:  [16 bmp] 16 bmp PEEP:  [5 cmH20] 5 cmH20 Plateau Pressure:  [20 cmH20] 20 cmH20  INTAKE / OUTPUT: I/O last 3  completed shifts: In: 2307.8 [I.V.:793.9; NG/GT:862.7; IV Piggyback:651.2] Out: -50 [Urine:150]  PHYSICAL EXAMINATION: General: RASS -1, + F/C Neuro: diffusely weak, no focal deficits HEENT: NCAT, WNL Cardiovascular: reg, no M noted Lungs: minimal scattered rhonchi Abdomen: obese, soft, +BS Ext: warm, no edema  LABS:  BMET  Recent Labs Lab 11/10/15 0617 11/10/15 1743 11/11/15 0419  NA 137 138 141  K 3.2* 3.6 3.3*  CL 101 100* 103  CO2 14* 21* 23  BUN 41* 23* 38*  CREATININE 2.70* 1.83* 2.55*  GLUCOSE 69 79 110*    Electrolytes  Recent Labs Lab 11/09/15 1005 11/09/15 1836  11/10/15 0617 11/10/15 1743 11/11/15 0419  CALCIUM 6.6* 7.3*  < > 6.8* 7.1* 7.1*  MG  --  1.9  --  2.2  --  2.0  PHOS 5.4* 1.9*  --  3.2  --   --   < > = values in this interval not displayed.  CBC  Recent Labs Lab 11/09/15 0751 11/10/15 0519 11/11/15 0419  WBC 10.1 2.9* 2.5*  HGB 11.3* 9.9* 9.9*  HCT 34.4* 29.9* 28.7*  PLT 196 140* 114*    Coag's  Recent Labs Lab 11/08/15 2136 11/10/15 0519 11/11/15 0419  APTT 29 35 37*  INR 1.49 1.96 1.74    Sepsis Markers  Recent Labs Lab 11/09/15 0336 11/09/15 1836 11/09/15 2056 11/10/15 0617 11/11/15 0419  LATICACIDVEN 1.6 1.4 1.1  --   --   PROCALCITON  --   --   --  12.13 28.57    ABG  Recent Labs Lab 11/09/15 0359  11/09/15 0526 11/09/15 1500  PHART 6.94* 6.99* 7.54*  PCO2ART 32 32 26*  PO2ART 64* 134* 204*    Liver Enzymes  Recent Labs Lab 11/10/15 0617 11/10/15 1743 11/11/15 0419  AST 1493* 1210* 979*  ALT 821* 843* 824*  ALKPHOS 138* 167* 172*  BILITOT 2.1* 1.6* 1.0  ALBUMIN 2.6* 2.6* 2.4*    Cardiac Enzymes  Recent Labs Lab 11/08/15 1238 11/09/15 0336  TROPONINI 0.07* 0.09*    Glucose  Recent Labs Lab 11/09/15 0158 11/10/15 0958 11/10/15 1154 11/10/15 1631 11/11/15 0806 11/11/15 1209  GLUCAP 106* 70 81 76 103* 85    CXR: RLL infiltrate vs atx    ASSESSMENT /  PLAN:  PULMONARY A: VDRF, resolved - intubated predominantly due to AMS and increased WOB due to acidosis RLL infiltrate - suspect PNA P:   Supplemental O2 Monitor in ICU post extubation  CARDIOVASCULAR A:  Shock - unclear etiology. Improving PAF with RVR Frequent PACs P:  MAP goal > 65 mmHg Transition from NE to PE  RENAL A:   AKI, oliguric - Uo improving Severe metabolic acidosis - unclear etiology, resolved Hypokalemia, recurrent P:   Monitor BMET intermittently Monitor I/Os Correct electrolytes as indicated Nephrology following  GASTROINTESTINAL A:   Elevated LFTs - improving. Suspect toxic injury Dysphagia post extubation P:   SUP: enteral PPI NPO until cognition improves  HEMATOLOGIC A:   Thrombocytopenia Coagulopathy - likely due to hepatic failure. Improving P:  DVT px: SQ heparin Monitor CBC intermittently Transfuse per usual guidelines Monitor plt on heparin  INFECTIOUS A:   Staph aureus PNA Elevated PCT P:   Monitor temp, WBC count Micro and abx as above  ENDOCRINE A:   Hypothyroidism Low TSH on levothyroxine P:   Cont levothyroxine at reduced dose  NEUROLOGIC A:   Acute toxic-metabolic encephalopathy Hypoactive delirium P:   RASS goal: 0 DC all sedatives   FAMILY  - Updates: husband updated @ bedside  CCM time: 40 mins The above time includes time spent in consultation with patient and/or family members and reviewing care plan on multidisciplinary rounds  Merton Border, MD PCCM service Mobile [phone] Pager [phone] 11/11/2015

## 2015-11-11 NOTE — Care Management (Signed)
Barriers-  Hemodialysis (not crt) intubated and full vent support.  Vassopressors from overdose. (not thought to be a suicide attempt)

## 2015-11-11 NOTE — Progress Notes (Signed)
Extubated without complications to 2lnc 

## 2015-11-11 NOTE — Progress Notes (Signed)
[facility] Progress Note Patient Name: Jamie Bennett DOB: 1961-06-04 MRN: [id number]   Date of Service  11/11/2015  HPI/Events of Note  NSR alternating with Sinus Tach with PAC's. K+ = 3.6 and Creatinine = 1.83.  eICU Interventions  Will order: 1. Cautiously replete K+. 2. Mg++ level now.      Intervention Category Major Interventions: Arrhythmia - evaluation and management  Sommer,Steven Eugene 11/11/2015, 1:55 AM

## 2015-11-11 NOTE — Progress Notes (Signed)
ANTIBIOTIC CONSULT NOTE - INITIAL  Pharmacy Consult for Vancomycin  Indication: bacteremia  No Known Allergies  Patient Measurements: Height: _0  (167.6 cm) Weight: 278 lb (126.1 kg) IBW/kg (Calculated) : 59.3 Adjusted Body Weight: 86.02 kg   Vital Signs: Temp: 99.1 F (37.3 C) (04/13 1800) Temp Source: Rectal (04/13 1945) BP: 104/60 mmHg (04/13 1800) Pulse Rate: 95 (04/13 1800) Intake/Output from previous day: 04/12 0701 - 04/13 0700 In: 1454.2 [I.V.:441.6; NG/GT:862.7; IV Piggyback:150] Out: -130 [Urine:70] Intake/Output from this shift:    Labs:  Recent Labs  11/09/15 0751  11/10/15 0519 11/10/15 0617 11/10/15 1743 11/11/15 0419  WBC 10.1  --  2.9*  --   --  2.5*  HGB 11.3*  --  9.9*  --   --  9.9*  PLT 196  --  140*  --   --  114*  CREATININE 2.95*  < >  --  2.70* 1.83* 2.55*  < > = values in this interval not displayed. Estimated Creatinine Clearance: 33.8 mL/min (by C-G formula based on Cr of 2.55). No results for input(s): VANCOTROUGH, VANCOPEAK, VANCORANDOM, GENTTROUGH, GENTPEAK, GENTRANDOM, TOBRATROUGH, TOBRAPEAK, TOBRARND, AMIKACINPEAK, AMIKACINTROU, AMIKACIN in the last 72 hours.   Microbiology: Recent Results (from the past 720 hour(s))  MRSA PCR Screening     Status: None   Collection Time: 11/09/15  2:09 AM  Result Value Ref Range Status   MRSA by PCR NEGATIVE NEGATIVE Final    Comment:        The GeneXpert MRSA Assay (FDA approved for NASAL specimens only), is one component of a comprehensive MRSA colonization surveillance program. It is not intended to diagnose MRSA infection nor to guide or monitor treatment for MRSA infections.   Culture, expectorated sputum-assessment     Status: None   Collection Time: 11/10/15  8:30 AM  Result Value Ref Range Status   Specimen Description ENDOTRACHEAL  Final   Special Requests NONE  Final   Sputum evaluation THIS SPECIMEN IS ACCEPTABLE FOR SPUTUM CULTURE  Final   Report Status 11/10/2015 FINAL   Final  Culture, respiratory (NON-Expectorated)     Status: None (Preliminary result)   Collection Time: 11/10/15  8:30 AM  Result Value Ref Range Status   Specimen Description ENDOTRACHEAL  Final   Special Requests NONE Reflexed from [id number]  Final   Gram Stain   Final    FEW WBC SEEN MANY GRAM POSITIVE COCCI RARE YEAST    Culture   Final    HEAVY GROWTH STAPHYLOCOCCUS AUREUS SUSCEPTIBILITIES TO FOLLOW    Report Status PENDING  Incomplete  CULTURE, BLOOD (ROUTINE X 2) w Reflex to PCR ID Panel     Status: None (Preliminary result)   Collection Time: 11/10/15  8:47 PM  Result Value Ref Range Status   Specimen Description BLOOD RIGHT HAND  Final   Special Requests BOTTLES DRAWN AEROBIC AND ANAEROBIC 5ML  Final   Culture  Setup Time   Final    GRAM POSITIVE COCCI ANAEROBIC BOTTLE ONLY CRITICAL RESULT CALLED TO, READ BACK BY AND VERIFIED WITH: Vian Fluegel [place] AT 2040 11/11/15 MSS. Organism ID to follow    Culture NO GROWTH < 12 HOURS  Final   Report Status PENDING  Incomplete  Blood Culture ID Panel (Reflexed)     Status: Abnormal   Collection Time: 11/10/15  8:47 PM  Result Value Ref Range Status   Enterococcus species NOT DETECTED NOT DETECTED Final   Vancomycin resistance NOT DETECTED NOT DETECTED Final  Listeria monocytogenes NOT DETECTED NOT DETECTED Final   Staphylococcus species DETECTED (A) NOT DETECTED Final    Comment: CRITICAL RESULT CALLED TO, READ BACK BY AND VERIFIED WITH: Dreshaun Stene [place] AT 2040 11/11/15 MSS    Staphylococcus aureus NOT DETECTED NOT DETECTED Final   Methicillin resistance DETECTED (A) NOT DETECTED Final    Comment: CRITICAL RESULT CALLED TO, READ BACK BY AND VERIFIED WITH: Jastin Fore [place] AT 2040 11/11/15 MSS    Streptococcus species NOT DETECTED NOT DETECTED Final   Streptococcus agalactiae NOT DETECTED NOT DETECTED Final   Streptococcus pneumoniae NOT DETECTED NOT DETECTED Final   Streptococcus pyogenes NOT DETECTED NOT DETECTED Final    Acinetobacter baumannii NOT DETECTED NOT DETECTED Final   Enterobacteriaceae species NOT DETECTED NOT DETECTED Final   Enterobacter cloacae complex NOT DETECTED NOT DETECTED Final   Escherichia coli NOT DETECTED NOT DETECTED Final   Klebsiella oxytoca NOT DETECTED NOT DETECTED Final   Klebsiella pneumoniae NOT DETECTED NOT DETECTED Final   Proteus species NOT DETECTED NOT DETECTED Final   Serratia marcescens NOT DETECTED NOT DETECTED Final   Carbapenem resistance NOT DETECTED NOT DETECTED Final   Haemophilus influenzae NOT DETECTED NOT DETECTED Final   Neisseria meningitidis NOT DETECTED NOT DETECTED Final   Pseudomonas aeruginosa NOT DETECTED NOT DETECTED Final   Candida albicans NOT DETECTED NOT DETECTED Final   Candida glabrata NOT DETECTED NOT DETECTED Final   Candida krusei NOT DETECTED NOT DETECTED Final   Candida parapsilosis NOT DETECTED NOT DETECTED Final   Candida tropicalis NOT DETECTED NOT DETECTED Final  CULTURE, BLOOD (ROUTINE X 2) w Reflex to PCR ID Panel     Status: None (Preliminary result)   Collection Time: 11/10/15  9:08 PM  Result Value Ref Range Status   Specimen Description BLOOD LEFT HAND  Final   Special Requests BOTTLES DRAWN AEROBIC AND ANAEROBIC 5ML  Final   Culture NO GROWTH < 12 HOURS  Final   Report Status PENDING  Incomplete  Blood Culture ID Panel (Reflexed)     Status: Abnormal   Collection Time: 11/11/15  7:26 PM  Result Value Ref Range Status   Enterococcus species NOT DETECTED NOT DETECTED Final   Vancomycin resistance NOT DETECTED NOT DETECTED Final   Listeria monocytogenes NOT DETECTED NOT DETECTED Final   Staphylococcus species NOT DETECTED NOT DETECTED Final   Staphylococcus aureus DETECTED (A) NOT DETECTED Final    Comment: CRITICAL RESULT CALLED TO, READ BACK BY AND VERIFIED WITH: Bryndon [place]ie [place] AT 2040 11/11/15 MSS.    Methicillin resistance DETECTED (A) NOT DETECTED Final    Comment: CRITICAL RESULT CALLED TO, READ BACK BY AND  VERIFIED WITH: Violeta Gelinas [place] AT 2040 11/11/15 MSS.    Streptococcus species NOT DETECTED NOT DETECTED Final   Streptococcus agalactiae NOT DETECTED NOT DETECTED Final   Streptococcus pneumoniae NOT DETECTED NOT DETECTED Final   Streptococcus pyogenes NOT DETECTED NOT DETECTED Final   Acinetobacter baumannii NOT DETECTED NOT DETECTED Final   Enterobacteriaceae species NOT DETECTED NOT DETECTED Final   Enterobacter cloacae complex NOT DETECTED NOT DETECTED Final   Escherichia coli NOT DETECTED NOT DETECTED Final   Klebsiella oxytoca NOT DETECTED NOT DETECTED Final   Klebsiella pneumoniae NOT DETECTED NOT DETECTED Final   Proteus species NOT DETECTED NOT DETECTED Final   Serratia marcescens NOT DETECTED NOT DETECTED Final   Carbapenem resistance NOT DETECTED NOT DETECTED Final   Haemophilus influenzae NOT DETECTED NOT DETECTED Final   Neisseria meningitidis NOT DETECTED NOT  DETECTED Final   Pseudomonas aeruginosa NOT DETECTED NOT DETECTED Final   Candida albicans NOT DETECTED NOT DETECTED Final   Candida glabrata NOT DETECTED NOT DETECTED Final   Candida krusei NOT DETECTED NOT DETECTED Final   Candida parapsilosis NOT DETECTED NOT DETECTED Final   Candida tropicalis NOT DETECTED NOT DETECTED Final    Medical History: Past Medical History  Diagnosis Date  . Osteoarthritis   . Osteoporosis   . Rheumatoid arthritis ([place])   . Thyroid disease   . Hypotension   . Constipation   . Anemia   . Bipolar 1 disorder ([place])   . Anxiety   . Fibromyalgia     Medications:  Scheduled:  . antiseptic oral rinse  7 mL Mouth Rinse QID  . cefTRIAXone (ROCEPHIN)  IV  1 g Intravenous Q24H  . chlorhexidine gluconate (SAGE KIT)  15 mL Mouth Rinse BID  . heparin  5,000 Units Subcutaneous 3 times per day  . levothyroxine  112 mcg Oral QAC breakfast  . sodium chloride flush  10-40 mL Intracatheter Q12H  . sodium chloride flush  3 mL Intravenous Q12H  . vancomycin  1,250 mg Intravenous Once  .  [START ON 11/12/2015] vancomycin  1,250 mg Intravenous Q24H   Assessment: Pharmacy consulted to dose Vanc in this 55 year old female with Staph species growing in [facility] X 1 , mech A detected.  CrCl = 33.8 ml/min ke = 0.032 hr-1 T1/2 = 21.7 hrs VD = 60.2 L   Goal of Therapy:  Vancomycin trough level 15-20 mcg/ml  Plan:  Expected duration 7 days with resolution of temperature and/or normalization of WBC   Vancomycin 1250 mg IV X 1 ordered to be given on 4/13 @ 22:00.  Vancomycin 1250 mg IV Q24H ordered to start 4/14 @ 9:00, ~ 11 hrs after 1st dose (stacked dosing). This pt will reach Css by 4/18 @ 10:00. Will draw 1st trough on 4/17 @ 8:30, which will not be at Css.   Minahil Quinlivan D 11/11/2015,9:11 PM

## 2015-11-11 NOTE — Progress Notes (Signed)
[facility] Progress Note Patient Name: Jamie Bennett DOB: 1961-05-22 MRN: [id number]   Date of Service  11/11/2015  HPI/Events of Note  RN calls because of afib in 150s.  Pt was extubated today. Had afib last night for a few minutes > converted to SR  Pt seen, in mild distress. 87/60, 140s, RR 20-30. 94% on 3L.   eICU Interventions  Plan to try bipap now. Trial with lopressor IV x 1 > may need cardizem drip vs amio.  May need heparin drip.  Will observe.      Intervention Category Major Interventions: Airway management  Rush Landmark 11/11/2015, 10:40 PM

## 2015-11-12 DIAGNOSIS — F319 Bipolar disorder, unspecified: Secondary | ICD-10-CM

## 2015-11-12 LAB — CBC
HCT: 27.4 % — ABNORMAL LOW (ref 35.0–47.0)
Hemoglobin: 9.4 g/dL — ABNORMAL LOW (ref 12.0–16.0)
MCH: 31.6 pg (ref 26.0–34.0)
MCHC: 34.3 g/dL (ref 32.0–36.0)
MCV: 92.4 fL (ref 80.0–100.0)
Platelets: 131 10*3/uL — ABNORMAL LOW (ref 150–440)
RBC: 2.97 MIL/uL — ABNORMAL LOW (ref 3.80–5.20)
RDW: 16.2 % — ABNORMAL HIGH (ref 11.5–14.5)
WBC: 5.2 10*3/uL (ref 3.6–11.0)

## 2015-11-12 LAB — BLOOD CULTURE ID PANEL (REFLEXED)
Acinetobacter baumannii: NOT DETECTED
Candida albicans: NOT DETECTED
Candida glabrata: NOT DETECTED
Candida krusei: NOT DETECTED
Candida parapsilosis: NOT DETECTED
Candida tropicalis: NOT DETECTED
Carbapenem resistance: NOT DETECTED
Enterobacter cloacae complex: NOT DETECTED
Enterobacteriaceae species: NOT DETECTED
Enterococcus species: NOT DETECTED
Escherichia coli: NOT DETECTED
Haemophilus influenzae: NOT DETECTED
Klebsiella oxytoca: NOT DETECTED
Klebsiella pneumoniae: NOT DETECTED
Listeria monocytogenes: NOT DETECTED
Neisseria meningitidis: NOT DETECTED
Proteus species: NOT DETECTED
Pseudomonas aeruginosa: NOT DETECTED
Serratia marcescens: NOT DETECTED
Staphylococcus aureus (BCID): NOT DETECTED
Staphylococcus species: NOT DETECTED
Streptococcus agalactiae: NOT DETECTED
Streptococcus pneumoniae: NOT DETECTED
Streptococcus pyogenes: NOT DETECTED
Streptococcus species: NOT DETECTED
Vancomycin resistance: NOT DETECTED

## 2015-11-12 LAB — BASIC METABOLIC PANEL
Anion gap: 20 — ABNORMAL HIGH (ref 5–15)
BUN: 74 mg/dL — ABNORMAL HIGH (ref 6–20)
CO2: 18 mmol/L — ABNORMAL LOW (ref 22–32)
Calcium: 8.3 mg/dL — ABNORMAL LOW (ref 8.9–10.3)
Chloride: 101 mmol/L (ref 101–111)
Creatinine, Ser: 3.8 mg/dL — ABNORMAL HIGH (ref 0.44–1.00)
GFR calc Af Amer: 14 mL/min — ABNORMAL LOW (ref >60)
GFR calc non Af Amer: 12 mL/min — ABNORMAL LOW (ref >60)
Glucose, Bld: 119 mg/dL — ABNORMAL HIGH (ref 65–99)
Potassium: 2.9 mmol/L — CL (ref 3.5–5.1)
Sodium: 139 mmol/L (ref 135–145)

## 2015-11-12 LAB — COMPREHENSIVE METABOLIC PANEL
ALT: 577 U/L — ABNORMAL HIGH (ref 14–54)
AST: 475 U/L — ABNORMAL HIGH (ref 15–41)
Albumin: 2.5 g/dL — ABNORMAL LOW (ref 3.5–5.0)
Alkaline Phosphatase: 191 U/L — ABNORMAL HIGH (ref 38–126)
Anion gap: 25 — ABNORMAL HIGH (ref 5–15)
BUN: 67 mg/dL — ABNORMAL HIGH (ref 6–20)
CO2: 15 mmol/L — ABNORMAL LOW (ref 22–32)
Calcium: 8 mg/dL — ABNORMAL LOW (ref 8.9–10.3)
Chloride: 101 mmol/L (ref 101–111)
Creatinine, Ser: 3.47 mg/dL — ABNORMAL HIGH (ref 0.44–1.00)
GFR calc Af Amer: 16 mL/min — ABNORMAL LOW (ref >60)
GFR calc non Af Amer: 14 mL/min — ABNORMAL LOW (ref >60)
Glucose, Bld: 56 mg/dL — ABNORMAL LOW (ref 65–99)
Potassium: 4.3 mmol/L (ref 3.5–5.1)
Sodium: 141 mmol/L (ref 135–145)
Total Bilirubin: 2.1 mg/dL — ABNORMAL HIGH (ref 0.3–1.2)
Total Protein: 5.6 g/dL — ABNORMAL LOW (ref 6.5–8.1)

## 2015-11-12 LAB — GLUCOSE, CAPILLARY
Glucose-Capillary: 47 mg/dL — ABNORMAL LOW (ref 65–99)
Glucose-Capillary: 78 mg/dL (ref 65–99)
Glucose-Capillary: 42 mg/dL — CL (ref 65–99)
Glucose-Capillary: 49 mg/dL — ABNORMAL LOW (ref 65–99)
Glucose-Capillary: 72 mg/dL (ref 65–99)
Glucose-Capillary: 47 mg/dL — ABNORMAL LOW (ref 65–99)
Glucose-Capillary: 63 mg/dL — ABNORMAL LOW (ref 65–99)
Glucose-Capillary: 110 mg/dL — ABNORMAL HIGH (ref 65–99)
Glucose-Capillary: 68 mg/dL (ref 65–99)

## 2015-11-12 LAB — PROCALCITONIN: Procalcitonin: 24.09 ng/mL

## 2015-11-12 MED ORDER — SODIUM BICARBONATE 8.4 % IV SOLN
INTRAVENOUS | Status: DC
  Administered 2015-11-12: 20:00:00 via INTRAVENOUS
  Filled 2015-11-12 (×3): qty 1000

## 2015-11-12 MED ORDER — SODIUM BICARBONATE 8.4 % IV SOLN
INTRAVENOUS | Status: DC
  Administered 2015-11-12: 10:00:00 via INTRAVENOUS
  Filled 2015-11-12 (×2): qty 100

## 2015-11-12 MED ORDER — DEXTROSE 50 % IV SOLN
INTRAVENOUS | Status: AC
  Administered 2015-11-12: 20:00:00
  Filled 2015-11-12: qty 50

## 2015-11-12 MED ORDER — LEVOTHYROXINE SODIUM 100 MCG IV SOLR
50.0000 ug | Freq: Every day | INTRAVENOUS | Status: DC
  Administered 2015-11-12 – 2015-11-15 (×4): 50 ug via INTRAVENOUS
  Filled 2015-11-12 (×4): qty 5

## 2015-11-12 MED ORDER — DEXTROSE 50 % IV SOLN: 50.0000 mL | Freq: Once | INTRAVENOUS | Status: AC

## 2015-11-12 MED ORDER — DEXTROSE 50 % IV SOLN
50.0000 mL | Freq: Once | INTRAVENOUS | Status: AC
  Administered 2015-11-12: 50 mL via INTRAVENOUS

## 2015-11-12 MED ORDER — POTASSIUM CHLORIDE 10 MEQ/50ML IV SOLN
10.0000 meq | INTRAVENOUS | Status: AC
  Administered 2015-11-12 – 2015-11-13 (×6): 10 meq via INTRAVENOUS
  Filled 2015-11-12 (×6): qty 50

## 2015-11-12 MED ORDER — DEXTROSE 50 % IV SOLN
1.0000 | Freq: Once | INTRAVENOUS | Status: AC
  Administered 2015-11-12: 50 mL via INTRAVENOUS
  Filled 2015-11-12: qty 50

## 2015-11-12 MED ORDER — METOPROLOL TARTRATE 1 MG/ML IV SOLN: 2.5000 mg | INTRAVENOUS | Status: DC | PRN

## 2015-11-12 MED ORDER — DEXTROSE 50 % IV SOLN
INTRAVENOUS | Status: AC
  Administered 2015-11-12 (×2): 25 mL
  Filled 2015-11-12: qty 50

## 2015-11-12 MED ORDER — FAMOTIDINE IN NACL 20-0.9 MG/50ML-% IV SOLN
20.0000 mg | INTRAVENOUS | Status: DC
  Administered 2015-11-12 – 2015-11-13 (×2): 20 mg via INTRAVENOUS
  Filled 2015-11-12 (×2): qty 50

## 2015-11-12 MED ORDER — DEXTROSE 10 % IV SOLN: INTRAVENOUS | Status: DC

## 2015-11-12 MED ORDER — DEXTROSE 50 % IV SOLN
INTRAVENOUS | Status: AC
  Filled 2015-11-12: qty 50

## 2015-11-12 MED ORDER — DEXTROSE 50 % IV SOLN
25.0000 mL | Freq: Once | INTRAVENOUS | Status: AC
  Administered 2015-11-12 – 2015-11-13 (×2): 25 mL via INTRAVENOUS

## 2015-11-12 NOTE — Care Management (Signed)
Extubated 4/13.  Last HD treatment 4/12.  PT consult is pending.

## 2015-11-12 NOTE — Progress Notes (Signed)
Converted back to sinus rhythm. 

## 2015-11-12 NOTE — Progress Notes (Signed)
PULMONARY / CRITICAL CARE MEDICINE   Name: Jamie Bennett MRN: [id number] DOB: 10-Apr-1961    ADMISSION DATE:  11/08/2015  PT PROFILE:   16 F adm via ED with altered mental status, history of bipolar disorder. She has a prescription for Provigil. Over the last 4-5 days she took majority of her monthly prescription meds. She was lethargic and increased WOB upon initial assessment and intubated. Intubated, vasopressor dependent shock, AKI (oliguric), severe acidosis, acute hepatic failure. Husband feels strongly that she took no other ingestions and that this was not with suicidal intent  MAJOR EVENTS/TEST RESULTS: 04/11 Admitted as above, intubated, vasopressors, CVL and HD cath placed, HD performed, HCO3 gtt, fomipazole and NAC administered per poison control even though acetaminophen levels undetectable X 2. Volatile alcohols negative. Serum acetone level markedly elevated 04/12 Much improved. Acidosis and LFTs improving. Remains oliguric, HD repeated. Fomipazole stopped. Weaning norepinephrine. Tolerating PSV mode 04/13 very brief episode of AFRVR. Frequent PACs. Norepi changed to phenylephrine. Extubated and tolerating. Uo improving 04/14 transient AF - resolved spontaneously. Tolerating extubation. Cognition improving. Uo improving but Cr rising. Changed to SDU   INDWELLING DEVICES:: ETT 04/11 >> 04/13 L IJ CVL 04/11 >>  R femoral HD cath 04/11 >>   MICRO DATA: MRSA PCR 04/11 >> NEG Resp 04/12 >> MSSA Blood 04/13 >> 1/2 coag ne staph - deemed contaminant  ANTIMICROBIALS:  Ceftriaxone 04/12 >>  Vanc X 1 04/14 PCT algorithm 04/12: 12.13, 28.57, 24.09  SUBJECTIVE:  RASS 0. + F/C sluggishly. No respiratory distress  VITAL SIGNS: BP 116/68 mmHg  Pulse 91  Temp(Src) 98.8 F (37.1 C) (Rectal)  Resp 8  Ht 5\' 6"  (1.676 m)  Wt 126.1 kg (278 lb)  BMI 44.89 kg/m2  SpO2 97%  HEMODYNAMICS:    VENTILATOR SETTINGS: Vent Mode:  [-]  FiO2 (%):  [35 %] 35 %  INTAKE /  OUTPUT: I/O last 3 completed shifts: In: 1164.5 [I.V.:339.5; NG/GT:625; IV Piggyback:200] Out: 1125 [Urine:1125]  PHYSICAL EXAMINATION: General: RASS 0, + F/C Neuro: diffusely weak, no focal deficits HEENT: NCAT, WNL Cardiovascular: reg, no M noted Lungs: minimal scattered rhonchi Abdomen: obese, soft, +BS Ext: warm, no edema  LABS:  BMET  Recent Labs Lab 11/10/15 1743 11/11/15 0419 11/12/15 0527  NA 138 141 141  K 3.6 3.3* 4.3  CL 100* 103 101  CO2 21* 23 15*  BUN 23* 38* 67*  CREATININE 1.83* 2.55* 3.47*  GLUCOSE 79 110* 56*    Electrolytes  Recent Labs Lab 11/09/15 1005 11/09/15 1836  11/10/15 0617 11/10/15 1743 11/11/15 0419 11/12/15 0527  CALCIUM 6.6* 7.3*  < > 6.8* 7.1* 7.1* 8.0*  MG  --  1.9  --  2.2  --  2.0  --   PHOS 5.4* 1.9*  --  3.2  --   --   --   < > = values in this interval not displayed.  CBC  Recent Labs Lab 11/10/15 0519 11/11/15 0419 11/12/15 0527  WBC 2.9* 2.5* 5.2  HGB 9.9* 9.9* 9.4*  HCT 29.9* 28.7* 27.4*  PLT 140* 114* 131*    Coag's  Recent Labs Lab 11/08/15 2136 11/10/15 0519 11/11/15 0419  APTT 29 35 37*  INR 1.49 1.96 1.74    Sepsis Markers  Recent Labs Lab 11/09/15 0336 11/09/15 1836 11/09/15 2056 11/10/15 0617 11/11/15 0419 11/12/15 0527  LATICACIDVEN 1.6 1.4 1.1  --   --   --   PROCALCITON  --   --   --  12.13 28.57 24.09    ABG  Recent Labs Lab 11/09/15 0359 11/09/15 0526 11/09/15 1500  PHART 6.94* 6.99* 7.54*  PCO2ART 32 32 26*  PO2ART 64* 134* 204*    Liver Enzymes  Recent Labs Lab 11/10/15 1743 11/11/15 0419 11/12/15 0527  AST 1210* 979* 475*  ALT 843* 824* 577*  ALKPHOS 167* 172* 191*  BILITOT 1.6* 1.0 2.1*  ALBUMIN 2.6* 2.4* 2.5*    Cardiac Enzymes  Recent Labs Lab 11/08/15 1238 11/09/15 0336  TROPONINI 0.07* 0.09*    Glucose  Recent Labs Lab 11/11/15 1209 11/12/15 0747 11/12/15 0810 11/12/15 0903 11/12/15 0955 11/12/15 1218  GLUCAP 85 47* 78 42* 49*  72    CXR: NNF   ASSESSMENT / PLAN:  PULMONARY A: VDRF, resolved - intubated predominantly due to AMS and increased WOB due to acidosis RLL infiltrate - suspect MSSA PNA P:   Supplemental O2 Cont to monitor in SDU level of care  CARDIOVASCULAR A:  Shock - unclear etiology. Improving PAF with RVR Frequent PACs P:  MAP goal > 65 mmHg Wean PE to off as tolerated  RENAL A:   AKI, now non-oliguric - Uo improving but Cr rising Severe metabolic acidosis - unclear etiology, resolved Hypokalemia, recurrent P:   Monitor BMET intermittently Monitor I/Os Correct electrolytes as indicated Low dose HCO3 gtt initiated 04/14 Nephrology following - no indication for HD 04/14  GASTROINTESTINAL A:   Elevated LFTs - improving. Suspect toxic injury Dysphagia post extubation P:   SUP: enteral PPI NPO until cognition improves SLP eval 04/14  HEMATOLOGIC A:   Thrombocytopenia Coagulopathy - likely due to hepatic failure. Improving P:  DVT px: SQ heparin Monitor CBC intermittently Transfuse per usual guidelines Monitor plt on heparin  INFECTIOUS A:   MSSA PNA Elevated PCT P:   Monitor temp, WBC count Micro and abx as above  ENDOCRINE A:   Hypothyroidism Low TSH on levothyroxine P:   Cont levothyroxine at reduced dose   NEUROLOGIC A:   Acute toxic-metabolic encephalopathy, slowly improving Bipolar disorder Overdose - unclear intent P:   RASS goal: 0 Will eventually need psychiatry eval   FAMILY  - Updates: husband updated @ bedside   Merton Border, MD PCCM service Mobile [phone] Pager [phone] 11/12/2015

## 2015-11-12 NOTE — Plan of Care (Signed)
Problem: SLP Dysphagia Goals Goal: Misc Dysphagia Goal Pt will safely tolerate po diet of least restrictive consistency w/ no overt s/s of aspiration noted by Staff/pt/family x3 sessions.    

## 2015-11-12 NOTE — Progress Notes (Signed)
Nutrition Follow-up  DOCUMENTATION CODES:   Morbid obesity  INTERVENTION:   -Recommend addition of Magic Cup BID, Honey Thick Mighty Shake at Breakfast -Cater to pt preferences -Feeding assistance at meal times -Recommend further intervention regarding bowel regimen as pt without BM since admission  NUTRITION DIAGNOSIS:   Inadequate oral intake related to acute illness as evidenced by NPO status. Being addressed as diet advanced post extubation  GOAL:   Patient will meet greater than or equal to 90% of their needs  MONITOR:   Diet advancement, Weight trends, Labs  REASON FOR ASSESSMENT:   Consult Enteral/tube feeding initiation and management  ASSESSMENT:   55 yo female admitted with acute metabolic encephalopathy with severe metabolic acidosis s/p drug overdose with multi organ failure requiring intubation and  initiation of HD  Pt remains extubated, diet advanced post SLP evaluation today, encephalopathy slowing improving, no recorded po intake just yet  Diet Order:  DIET - DYS 1 Room service appropriate?: Yes with Assist; Fluid consistency:: Honey Thick   Skin:  Reviewed, no issues  Last BM:  no BM since admission, pt received dulcolax suppository yesterday  Meds: sodium bicarbonate at 50 ml/hr  Height:   Ht Readings from Last 1 Encounters:  11/09/15 5\' 6"  (1.676 m)    Weight:   Wt Readings from Last 1 Encounters:  11/11/15 278 lb (126.1 kg)    Filed Weights   11/10/15 0945 11/10/15 1345 11/11/15 0626  Weight: 285 lb 11.5 oz (129.6 kg) 285 lb 11.5 oz (129.6 kg) 278 lb (126.1 kg)    BMI:  Body mass index is 44.89 kg/(m^2).  Estimated Nutritional Needs:   Kcal:  2000-2435 kcals   Protein:  90-118 g   Fluid:  >2 L  EDUCATION NEEDS:   Education needs no appropriate at this time  [place], [place], [place] [phone] Pager  [phone] Weekend/On-Call Pager

## 2015-11-12 NOTE — Progress Notes (Signed)
PT Cancellation Note  Patient Details Name: Jamie Bennett MRN: [id number] DOB: 1961/02/20   Cancelled Treatment:    Reason Eval/Treat Not Completed: Patient not medically ready;Patient's level of consciousness. Chart reviewed, RN consulted. Holding pt treatment at this time due to lethargy, somnolence, and placement of temporary femoral dialysis catheter. Spoke with husband at bedside and collected patient's history. Per [facility] policy, PT cannot perform OOB c PT while a temporary femoral catheter is placed. Granted pt's high level of function at baseline, it will be best to wait until removal of temporary femoral cath to assess OOB mobility and activity tolerance, prior to mobility and DME recommendations. Will attempt at later date/time.   2:24 PM, 11/12/2015 Etta Grandchild, PT, DPT PRN Physical Therapist - [place] License # [id number] [phone] (mobile)

## 2015-11-12 NOTE — Evaluation (Signed)
Clinical/Bedside Swallow Evaluation Patient Details  Name: Jamie Bennett MRN: [id number] Date of Birth: 12-09-1960  Today's Date: 11/12/2015 Time: SLP Start Time (ACUTE ONLY): 1145 SLP Stop Time (ACUTE ONLY): 1245 SLP Time Calculation (min) (ACUTE ONLY): 60 min  Past Medical History:  Past Medical History  Diagnosis Date  . Osteoarthritis   . Osteoporosis   . Rheumatoid arthritis ([place])   . Thyroid disease   . Hypotension   . Constipation   . Anemia   . Bipolar 1 disorder ([place])   . Anxiety   . Fibromyalgia    Past Surgical History:  Past Surgical History  Procedure Laterality Date  . Gastric bypass    . Cholecystectomy     HPI:  Pt is a 55 y.o. female w/ h/o Gastric Bypass surgery 15+ yrs ago, Bipolar, Dis., Constipation, anxiety, RA, OA, Fibromyalgia, thyroid dis., reflux, and other medical issues who presents with altered mental status from Drug Overdose per MD. Patient was unable to contribute to her history, and collateral information is taken from her husband who is present at bedside. He states that she has bipolar, with depressive episodes. She has a prescription for Provigil, and he feels that she likely can overdose of this medication. She has done this before. He states that he controls her medications and typically keeps that all locked up, but that she sometimes will still pick it up from the pharmacy. On evaluation in the ED she was responsive, but not in any coherent way. She wass also found to have acute kidney injury, as well as significantly elevated liver enzymes. Her blood pressure was also trending down. Pt was orally intubated for ~3 days, extubated yesterday. She is drowsy w/ mumbled speech requiring max cues to attend to task of taking po's. Husband stated she "coughed" on ice given to her earlier. Vocal quality c/b gravely hoarsenss suspect d/t recent intubation.   Assessment / Plan / Recommendation Clinical Impression  Pt appears at increased risk for  aspiration sec. to her declined alertness and slower response/timing overall. Pt exhibited overt s/s of aspiration w/ trials of ice chips(congested coughing) w/ SLP (and husband) then slow oral phase management of all boluses in general (liquids, purees). Pt was able to transfer and clear orally trials of Honey consistency liquids and 1 tsp of puree given time. She required mod+ verbal/tactile cues in general to attend to tasks of po's. Pt exhibits reduced alertness to task suspect d/t the recent medication taken. Rec. a conservative diet of Dys. 1 w/ Honey consistency liquids via tsp as nec. w/ strict aspiration precautions; meds in Puree - crushed as needed. Rec. continued trials/assessment of toleration of po's of increased consistency as pt's alertness improves and risk for aspiration lessens. This was explained to Husband who agreed. NSG updated. Dietician consulted d/t pt's baseline status of gastric bypass surgery and dietary needs.     Aspiration Risk  Moderate aspiration risk    Diet Recommendation  Dys. 1 w/ Honey consistency liquids via tsp, cup; strict aspiration precautions; feeding assistance at all meals - feed only when fully alert and awake to participate  Medication Administration: Crushed with puree    Other  Recommendations Recommended Consults:  (Dietician) Oral Care Recommendations: Oral care BID;Staff/trained caregiver to provide oral care Other Recommendations: Order thickener from pharmacy;Prohibited food (jello, ice cream, thin soups);Remove water pitcher   Follow up Recommendations   (TBD)    Frequency and Duration min 3x week  2 weeks  Prognosis Prognosis for Safe Diet Advancement: Fair (-Good) Barriers to Reach Goals:  (Drowsiness at this time)      Swallow Study   General Date of Onset: 11/08/15 HPI: Pt is a 55 y.o. female w/ h/o Gastric Bypass surgery 15+ yrs ago, Bipolar, Dis., Constipation, anxiety, RA, OA, Fibromyalgia, thyroid dis., reflux, and other  medical issues who presents with altered mental status from Drug Overdose per MD. Patient was unable to contribute to her history, and collateral information is taken from her husband who is present at bedside. He states that she has bipolar, with depressive episodes. She has a prescription for Provigil, and he feels that she likely can overdose of this medication. She has done this before. He states that he controls her medications and typically keeps that all locked up, but that she sometimes will still pick it up from the pharmacy. On evaluation in the ED she was responsive, but not in any coherent way. She wass also found to have acute kidney injury, as well as significantly elevated liver enzymes. Her blood pressure was also trending down. Pt was orally intubated for ~3 days, extubated yesterday. She is drowsy w/ mumbled speech requiring max cues to attend to task of taking po's. Husband stated she "coughed" on ice given to her earlier. Vocal quality c/b gravely hoarsenss suspect d/t recent intubation. Type of Study: Bedside Swallow Evaluation Previous Swallow Assessment: none Diet Prior to this Study: Regular;Thin liquids (at home w/ no swallowing deficits then) Temperature Spikes Noted:  (temps 95.7-99.7;  wbc 5.2) Respiratory Status: Nasal cannula (2 liters) History of Recent Intubation: Yes Length of Intubations (days): 3 days Date extubated: 11/11/15 Behavior/Cognition: Lethargic/Drowsy;Distractible;Requires cueing Oral Cavity Assessment: Within Functional Limits Oral Care Completed by SLP: Yes Oral Cavity - Dentition: Adequate natural dentition Vision:  (n/a) Self-Feeding Abilities: Total assist Patient Positioning: Upright in bed Baseline Vocal Quality: Breathy;Hoarse;Low vocal intensity (Gravely) Volitional Cough: Strong Volitional Swallow: Able to elicit (slow)    Oral/Motor/Sensory Function Overall Oral Motor/Sensory Function: Within functional limits Facial ROM:  (gross overall  weakness during mumbling) Facial Symmetry: Within Functional Limits Lingual ROM: Within Functional Limits Lingual Symmetry: Within Functional Limits   Ice Chips Ice chips: Impaired Presentation: Spoon (fed; 1 trial) Oral Phase Impairments: Poor awareness of bolus Oral Phase Functional Implications: Prolonged oral transit Pharyngeal Phase Impairments: Cough - Immediate Other Comments: reduced alertness   Thin Liquid Thin Liquid: Not tested    Nectar Thick Nectar Thick Liquid: Not tested   Honey Thick Honey Thick Liquid: Within functional limits Presentation: Spoon (fed; 6 trials) Other Comments: min. slower A-P transit w/ the first bolus trial then fairly timely w/ the following trials   Puree Puree: Impaired Presentation: Spoon (fed; 1 trial) Oral Phase Impairments: Poor awareness of bolus Oral Phase Functional Implications: Prolonged oral transit Pharyngeal Phase Impairments:  (none) Other Comments: reduced alertness   Solid   GO   Solid: Not tested       Orinda Kenner, MS, CCC-SLP  [name],[name] 11/12/2015,12:59 PM

## 2015-11-12 NOTE — Progress Notes (Signed)
Pharmacy Antibiotic Follow-up Note  Jamie Bennett is a 55 y.o. year-old female admitted on 11/08/2015. Patient is currently on vancomycin for S aureus PNA and ceftriaxone for UTI.   Assessment/Plan: Continue vancomycin 1250  IV every 24 hours. Trough scheduled with the 5th total dose. Goal trough 15-20 mcg/mL.   Temp (24hrs), Avg:99 F (37.2 C), Min:95.7 F (35.4 C), Max:99.7 F (37.6 C)   Recent Labs Lab 11/09/15 0336 11/09/15 0751 11/10/15 0519 11/11/15 0419 11/12/15 0527  WBC 10.8 10.1 2.9* 2.5* 5.2     Recent Labs Lab 11/09/15 2056 11/10/15 0617 11/10/15 1743 11/11/15 0419 11/12/15 0527  CREATININE 2.17* 2.70* 1.83* 2.55* 3.47*   Estimated Creatinine Clearance: 24.9 mL/min (by C-G formula based on Cr of 3.47).    No Known Allergies  Antimicrobials this admission: Ceftriaxone 4/12  >>  vancomycin 4/13 >>   Levels/dose changes this admission:   Microbiology results: 4/13 BCx: CNS 1/2 4/12 UCx: GNR  4/12 Sputum:: MSSA   4/11  MRSA PCR: negative  Thank you for allowing pharmacy to be a part of this patient's care.  Ulice Dash D PharmD 11/12/2015 12:03 PM

## 2015-11-12 NOTE — Progress Notes (Signed)
[facility]  ROUNDING NOTE   Subjective:  Last HD on Apr 12 Extubated on 4/13 UOP appears to have improved Patient is able to follow simple commands   Objective:  Vital signs in last 24 hours:  Temp:  [98.2 F (36.8 C)-99.7 F (37.6 C)] 99.1 F (37.3 C) (04/14 0900) Pulse Rate:  [90-149] 92 (04/14 0900) Resp:  [11-25] 18 (04/14 0900) BP: (92-123)/(51-70) 113/63 mmHg (04/14 0900) SpO2:  [93 %-100 %] 96 % (04/14 0900) FiO2 (%):  [30 %-35 %] 35 % (04/14 0814)  Weight change:  Filed Weights   11/10/15 0945 11/10/15 1345 11/11/15 0626  Weight: 129.6 kg (285 lb 11.5 oz) 129.6 kg (285 lb 11.5 oz) 126.1 kg (278 lb)    Intake/Output: I/O last 3 completed shifts: In: 1164.5 [I.V.:339.5; NG/GT:625; IV Piggyback:200] Out: 5277 [Urine:1125]   Intake/Output this shift:  Total I/O In: 480.1 [I.V.:230.1; IV Piggyback:250] Out: 335 [Urine:335]  Physical Exam: General: Critically ill appearing  Head: Normocephalic, atraumatic.    Eyes: Anicteric  Neck: Supple, trachea midline  Lungs:  Clear to auscultation, vent assisted  Heart: S1S2 no rubs  Abdomen:  Soft, nontender, distended  Extremities: + peripheral edema.- improved  Neurologic: Intubated, sedated  Skin: No acute lesions  Access: Femoral temporary dialysis catheter    Basic Metabolic Panel:  Recent Labs Lab 11/09/15 0336  11/09/15 1005 11/09/15 1836 11/09/15 2056 11/10/15 0617 11/10/15 1743 11/11/15 0419 11/12/15 0527  NA 135  < > 137 139 137 137 138 141 141  K 4.5  < > 4.6 2.9* 2.9* 3.2* 3.6 3.3* 4.3  CL 110  < > 106 101 100* 101 100* 103 101  CO2 9*  < > 11* 18* 15* 14* 21* 23 15*  GLUCOSE 133*  < > 120* 67 76 69 79 110* 56*  BUN 54*  < > 63* 28* 33* 41* 23* 38* 67*  CREATININE 2.62*  < > 3.10* 1.88* 2.17* 2.70* 1.83* 2.55* 3.47*  CALCIUM 6.5*  < > 6.6* 7.3* 7.1* 6.8* 7.1* 7.1* 8.0*  MG 2.1  --   --  1.9  --  2.2  --  2.0  --   PHOS 6.6*  --  5.4* 1.9*  --  3.2  --   --   --   < > = values  in this interval not displayed.  Liver Function Tests:  Recent Labs Lab 11/09/15 2056 11/10/15 0617 11/10/15 1743 11/11/15 0419 11/12/15 0527  AST >2275* 1493* 1210* 979* 475*  ALT 967* 821* 843* 824* 577*  ALKPHOS 136* 138* 167* 172* 191*  BILITOT 2.0* 2.1* 1.6* 1.0 2.1*  PROT 5.2* 5.2* 5.4* 5.3* 5.6*  ALBUMIN 2.7* 2.6* 2.6* 2.4* 2.5*   No results for input(s): LIPASE, AMYLASE in the last 168 hours.  Recent Labs Lab 11/09/15 0751  AMMONIA 161*    CBC:  Recent Labs Lab 11/09/15 0336 11/09/15 0751 11/10/15 0519 11/11/15 0419 11/12/15 0527  WBC 10.8 10.1 2.9* 2.5* 5.2  HGB 10.8* 11.3* 9.9* 9.9* 9.4*  HCT 33.8* 34.4* 29.9* 28.7* 27.4*  MCV 100.4* 97.9 95.4 94.1 92.4  PLT 186 196 140* 114* 131*    Cardiac Enzymes:  Recent Labs Lab 11/08/15 1238 11/09/15 0336  TROPONINI 0.07* 0.09*    BNP: Invalid input(s): POCBNP  CBG:  Recent Labs Lab 11/10/15 1154 11/10/15 1631 11/11/15 0806 11/11/15 1209 11/12/15 0903  GLUCAP 81 76 103* 85 42*    Microbiology: Results for orders placed or performed during the hospital  encounter of 11/08/15  MRSA PCR Screening     Status: None   Collection Time: 11/09/15  2:09 AM  Result Value Ref Range Status   MRSA by PCR NEGATIVE NEGATIVE Final    Comment:        The GeneXpert MRSA Assay (FDA approved for NASAL specimens only), is one component of a comprehensive MRSA colonization surveillance program. It is not intended to diagnose MRSA infection nor to guide or monitor treatment for MRSA infections.   Culture, expectorated sputum-assessment     Status: None   Collection Time: 11/10/15  8:30 AM  Result Value Ref Range Status   Specimen Description ENDOTRACHEAL  Final   Special Requests NONE  Final   Sputum evaluation THIS SPECIMEN IS ACCEPTABLE FOR SPUTUM CULTURE  Final   Report Status 11/10/2015 FINAL  Final  Culture, respiratory (NON-Expectorated)     Status: None (Preliminary result)   Collection Time:  11/10/15  8:30 AM  Result Value Ref Range Status   Specimen Description ENDOTRACHEAL  Final   Special Requests NONE Reflexed from [id number]  Final   Gram Stain   Final    FEW WBC SEEN MANY GRAM POSITIVE COCCI RARE YEAST    Culture   Final    HEAVY GROWTH STAPHYLOCOCCUS AUREUS SUSCEPTIBILITIES TO FOLLOW    Report Status PENDING  Incomplete  CULTURE, BLOOD (ROUTINE X 2) w Reflex to PCR ID Panel     Status: None (Preliminary result)   Collection Time: 11/10/15  8:47 PM  Result Value Ref Range Status   Specimen Description BLOOD RIGHT HAND  Final   Special Requests BOTTLES DRAWN AEROBIC AND ANAEROBIC 5ML  Final   Culture  Setup Time   Final    GRAM POSITIVE COCCI ANAEROBIC BOTTLE ONLY CRITICAL RESULT CALLED TO, READ BACK BY AND VERIFIED WITH: JASON ROBBINS [place] AT 2040 11/11/15 MSS. Organism ID to follow    Culture   Final    COAGULASE NEGATIVE STAPHYLOCOCCUS ANAEROBIC BOTTLE ONLY THE SIGNIFICANCE OF ISOLATING THIS ORGANISM FROM A SINGLE VENIPUNCTURE CANNOT BE PREDICTED WITHOUT FURTHER CLINICAL AND CULTURE CORRELATION. SUSCEPTIBILITIES AVAILABLE ONLY ON REQUEST.    Report Status PENDING  Incomplete  Blood Culture ID Panel (Reflexed)     Status: Abnormal   Collection Time: 11/10/15  8:47 PM  Result Value Ref Range Status   Enterococcus species NOT DETECTED NOT DETECTED Final   Vancomycin resistance NOT DETECTED NOT DETECTED Final   Listeria monocytogenes NOT DETECTED NOT DETECTED Final   Staphylococcus species DETECTED (A) NOT DETECTED Final    Comment: CRITICAL RESULT CALLED TO, READ BACK BY AND VERIFIED WITH: JASON ROBBINS [place] AT 2040 11/11/15 MSS    Staphylococcus aureus NOT DETECTED NOT DETECTED Final   Methicillin resistance DETECTED (A) NOT DETECTED Final    Comment: CRITICAL RESULT CALLED TO, READ BACK BY AND VERIFIED WITH: JASON ROBBINS [place] AT 2040 11/11/15 MSS    Streptococcus species NOT DETECTED NOT DETECTED Final   Streptococcus agalactiae NOT DETECTED NOT DETECTED Final    Streptococcus pneumoniae NOT DETECTED NOT DETECTED Final   Streptococcus pyogenes NOT DETECTED NOT DETECTED Final   Acinetobacter baumannii NOT DETECTED NOT DETECTED Final   Enterobacteriaceae species NOT DETECTED NOT DETECTED Final   Enterobacter cloacae complex NOT DETECTED NOT DETECTED Final   Escherichia coli NOT DETECTED NOT DETECTED Final   Klebsiella oxytoca NOT DETECTED NOT DETECTED Final   Klebsiella pneumoniae NOT DETECTED NOT DETECTED Final   Proteus species NOT DETECTED NOT DETECTED Final  Serratia marcescens NOT DETECTED NOT DETECTED Final   Carbapenem resistance NOT DETECTED NOT DETECTED Final   Haemophilus influenzae NOT DETECTED NOT DETECTED Final   Neisseria meningitidis NOT DETECTED NOT DETECTED Final   Pseudomonas aeruginosa NOT DETECTED NOT DETECTED Final   Candida albicans NOT DETECTED NOT DETECTED Final   Candida glabrata NOT DETECTED NOT DETECTED Final   Candida krusei NOT DETECTED NOT DETECTED Final   Candida parapsilosis NOT DETECTED NOT DETECTED Final   Candida tropicalis NOT DETECTED NOT DETECTED Final  CULTURE, BLOOD (ROUTINE X 2) w Reflex to PCR ID Panel     Status: None (Preliminary result)   Collection Time: 11/10/15  9:08 PM  Result Value Ref Range Status   Specimen Description BLOOD LEFT HAND  Final   Special Requests BOTTLES DRAWN AEROBIC AND ANAEROBIC 5ML  Final   Culture NO GROWTH < 12 HOURS  Final   Report Status PENDING  Incomplete  Blood Culture ID Panel (Reflexed)     Status: Abnormal   Collection Time: 11/11/15  7:26 PM  Result Value Ref Range Status   Enterococcus species NOT DETECTED NOT DETECTED Final   Vancomycin resistance NOT DETECTED NOT DETECTED Final   Listeria monocytogenes NOT DETECTED NOT DETECTED Final   Staphylococcus species NOT DETECTED NOT DETECTED Final   Staphylococcus aureus DETECTED (A) NOT DETECTED Final    Comment: CRITICAL RESULT CALLED TO, READ BACK BY AND VERIFIED WITH: JASON ROBBINS [place] AT 2040 11/11/15 MSS.     Methicillin resistance DETECTED (A) NOT DETECTED Final    Comment: CRITICAL RESULT CALLED TO, READ BACK BY AND VERIFIED WITH: Violeta Gelinas [place] AT 2040 11/11/15 MSS.    Streptococcus species NOT DETECTED NOT DETECTED Final   Streptococcus agalactiae NOT DETECTED NOT DETECTED Final   Streptococcus pneumoniae NOT DETECTED NOT DETECTED Final   Streptococcus pyogenes NOT DETECTED NOT DETECTED Final   Acinetobacter baumannii NOT DETECTED NOT DETECTED Final   Enterobacteriaceae species NOT DETECTED NOT DETECTED Final   Enterobacter cloacae complex NOT DETECTED NOT DETECTED Final   Escherichia coli NOT DETECTED NOT DETECTED Final   Klebsiella oxytoca NOT DETECTED NOT DETECTED Final   Klebsiella pneumoniae NOT DETECTED NOT DETECTED Final   Proteus species NOT DETECTED NOT DETECTED Final   Serratia marcescens NOT DETECTED NOT DETECTED Final   Carbapenem resistance NOT DETECTED NOT DETECTED Final   Haemophilus influenzae NOT DETECTED NOT DETECTED Final   Neisseria meningitidis NOT DETECTED NOT DETECTED Final   Pseudomonas aeruginosa NOT DETECTED NOT DETECTED Final   Candida albicans NOT DETECTED NOT DETECTED Final   Candida glabrata NOT DETECTED NOT DETECTED Final   Candida krusei NOT DETECTED NOT DETECTED Final   Candida parapsilosis NOT DETECTED NOT DETECTED Final   Candida tropicalis NOT DETECTED NOT DETECTED Final    Coagulation Studies:  Recent Labs  11/10/15 0519 11/11/15 0419  LABPROT 22.2* 20.3*  INR 1.96 1.74    Urinalysis: No results for input(s): COLORURINE, LABSPEC, PHURINE, GLUCOSEU, HGBUR, BILIRUBINUR, KETONESUR, PROTEINUR, UROBILINOGEN, NITRITE, LEUKOCYTESUR in the last 72 hours.  Invalid input(s): APPERANCEUR    Imaging: Dg Chest Port 1 View  11/11/2015  CLINICAL DATA:  Respiratory failure. EXAM: PORTABLE CHEST 1 VIEW COMPARISON:  11/10/2015. FINDINGS: Endotracheal tube in stable position. Left IJ line noted, its tip is appears to be coiled in the SVC. Bibasilar  atelectasis and/or infiltrates noted, slightly progressed from prior exam. No pleural effusion pneumothorax. Heart size normal. No pneumothorax. IMPRESSION: 1. Endotracheal tube in stable position. Left IJ line tip  appears to be coiled in the SVC. 2. Low lung volumes with interim slight progression of bibasilar atelectasis and/or infiltrates. Electronically Signed   By: Marcello Moores  Register   On: 11/11/2015 07:33     Medications:   . phenylephrine (NEO-SYNEPHRINE) Adult infusion Stopped (11/12/15 0919)  .  sodium bicarbonate  infusion 1000 mL     . antiseptic oral rinse  7 mL Mouth Rinse QID  . cefTRIAXone (ROCEPHIN)  IV  1 g Intravenous Q24H  . chlorhexidine gluconate (SAGE KIT)  15 mL Mouth Rinse BID  . heparin  5,000 Units Subcutaneous 3 times per day  . levothyroxine  112 mcg Oral QAC breakfast  . sodium chloride flush  10-40 mL Intracatheter Q12H  . sodium chloride flush  3 mL Intravenous Q12H  . vancomycin  1,250 mg Intravenous Q24H   sodium chloride, sodium chloride, alteplase, bisacodyl, heparin, lidocaine (PF), lidocaine-prilocaine, metoprolol, pentafluoroprop-tetrafluoroeth, sodium chloride flush  Assessment/ Plan:  55 y.o. female with a PMHx of osteoarthritis, osteoporosis, rheumatoid arthritis, bipolar disorder, anxiety, fibromyalgia, status post gastric bypass, who was admitted to [facility] on 11/08/2015 for evaluation of altered mental status. Patient apparently took many of her outpatient prescriptions over the past 4-5 days. She may have possibly also overdosed on Provigil.  1. Acute renal failure ? Baseline Creatinine 2. Metabolic acidosis  3. drug overdose, patient apparently took provigil, elevated osmolar gap of 32. Volatile acid screen positive for acetone.  4. Acute respiratory failure. Extubated 4/13  5.  Acute hepatic failure 6.  MRSA Bacteremia  Plan:  S Cr is a little higher which is expected as patient was getting dialysis until recently UOP has improved and is  approx 100 cc / hr at present Electrolytes and Volume status are acceptable No acute indication for Dialysis at present    LOS: 4 Jamie Bennett 4/14/20179:44 AM

## 2015-11-12 NOTE — Progress Notes (Signed)
eLink Physician-Brief Progress Note Patient Name: Jamie Bennett DOB: 10/09/1960 MRN: [id number]   Date of Service  11/12/2015  HPI/Events of Note  Ongoing hypoglycemia on D10 infusion Hypokalemia  eICU Interventions  D50 IV 1 amp Increase D10 gtt to 75 cc/hr Potassium replaced     Intervention Category Major Interventions: Other:;Electrolyte abnormality - evaluation and management  [name] 11/12/2015, 10:10 PM

## 2015-11-13 ENCOUNTER — Inpatient Hospital Stay: Payer: Medicare HMO

## 2015-11-13 LAB — BLOOD GAS, ARTERIAL
Acid-Base Excess: 4.6 mmol/L — ABNORMAL HIGH (ref 0.0–3.0)
Bicarbonate: 26.3 mEq/L (ref 21.0–28.0)
FIO2: 0.28
O2 Saturation: 99.8 %
Patient temperature: 37
pCO2 arterial: 28 mmHg — ABNORMAL LOW (ref 32.0–48.0)
pH, Arterial: 7.58 — ABNORMAL HIGH (ref 7.350–7.450)
pO2, Arterial: 205 mmHg — ABNORMAL HIGH (ref 83.0–108.0)

## 2015-11-13 LAB — CBC
HCT: 26.9 % — ABNORMAL LOW (ref 35.0–47.0)
Hemoglobin: 9.3 g/dL — ABNORMAL LOW (ref 12.0–16.0)
MCH: 31.4 pg (ref 26.0–34.0)
MCHC: 34.4 g/dL (ref 32.0–36.0)
MCV: 91.2 fL (ref 80.0–100.0)
Platelets: 118 10*3/uL — ABNORMAL LOW (ref 150–440)
RBC: 2.95 MIL/uL — ABNORMAL LOW (ref 3.80–5.20)
RDW: 16 % — ABNORMAL HIGH (ref 11.5–14.5)
WBC: 4.6 10*3/uL (ref 3.6–11.0)

## 2015-11-13 LAB — COMPREHENSIVE METABOLIC PANEL
ALT: 328 U/L — ABNORMAL HIGH (ref 14–54)
AST: 164 U/L — ABNORMAL HIGH (ref 15–41)
Albumin: 2.2 g/dL — ABNORMAL LOW (ref 3.5–5.0)
Alkaline Phosphatase: 184 U/L — ABNORMAL HIGH (ref 38–126)
Anion gap: 15 (ref 5–15)
BUN: 69 mg/dL — ABNORMAL HIGH (ref 6–20)
CO2: 22 mmol/L (ref 22–32)
Calcium: 8 mg/dL — ABNORMAL LOW (ref 8.9–10.3)
Chloride: 101 mmol/L (ref 101–111)
Creatinine, Ser: 3.41 mg/dL — ABNORMAL HIGH (ref 0.44–1.00)
GFR calc Af Amer: 16 mL/min — ABNORMAL LOW (ref >60)
GFR calc non Af Amer: 14 mL/min — ABNORMAL LOW (ref >60)
Glucose, Bld: 93 mg/dL (ref 65–99)
Potassium: 3.1 mmol/L — ABNORMAL LOW (ref 3.5–5.1)
Sodium: 138 mmol/L (ref 135–145)
Total Bilirubin: 1.1 mg/dL (ref 0.3–1.2)
Total Protein: 5.5 g/dL — ABNORMAL LOW (ref 6.5–8.1)

## 2015-11-13 LAB — GLUCOSE, CAPILLARY
Glucose-Capillary: 100 mg/dL — ABNORMAL HIGH (ref 65–99)
Glucose-Capillary: 109 mg/dL — ABNORMAL HIGH (ref 65–99)
Glucose-Capillary: 70 mg/dL (ref 65–99)
Glucose-Capillary: 78 mg/dL (ref 65–99)
Glucose-Capillary: 83 mg/dL (ref 65–99)
Glucose-Capillary: 87 mg/dL (ref 65–99)
Glucose-Capillary: 91 mg/dL (ref 65–99)
Glucose-Capillary: 93 mg/dL (ref 65–99)
Glucose-Capillary: 94 mg/dL (ref 65–99)

## 2015-11-13 LAB — CULTURE, RESPIRATORY W GRAM STAIN

## 2015-11-13 LAB — URINE CULTURE: Culture: >=100000 — AB

## 2015-11-13 LAB — PHOSPHORUS: Phosphorus: <1 mg/dL — CL (ref 2.5–4.6)

## 2015-11-13 LAB — CULTURE, RESPIRATORY

## 2015-11-13 LAB — MAGNESIUM: Magnesium: 2 mg/dL (ref 1.7–2.4)

## 2015-11-13 MED ORDER — BUSPIRONE HCL 10 MG PO TABS
10.0000 mg | ORAL_TABLET | Freq: Three times a day (TID) | ORAL | Status: DC
  Administered 2015-11-13 – 2015-11-19 (×18): 10 mg via ORAL
  Filled 2015-11-13: qty 2
  Filled 2015-11-13 (×5): qty 1
  Filled 2015-11-13: qty 2
  Filled 2015-11-13 (×2): qty 1
  Filled 2015-11-13: qty 2
  Filled 2015-11-13 (×7): qty 1
  Filled 2015-11-13: qty 2

## 2015-11-13 MED ORDER — VENLAFAXINE HCL 25 MG PO TABS
50.0000 mg | ORAL_TABLET | Freq: Two times a day (BID) | ORAL | Status: DC
  Administered 2015-11-13 – 2015-11-19 (×11): 50 mg via ORAL
  Filled 2015-11-13 (×6): qty 2
  Filled 2015-11-13: qty 1
  Filled 2015-11-13 (×4): qty 2
  Filled 2015-11-13: qty 1
  Filled 2015-11-13 (×2): qty 2

## 2015-11-13 MED ORDER — IPRATROPIUM-ALBUTEROL 0.5-2.5 (3) MG/3ML IN SOLN
3.0000 mL | RESPIRATORY_TRACT | Status: DC | PRN
  Administered 2015-11-13: 3 mL via RESPIRATORY_TRACT
  Filled 2015-11-13: qty 3

## 2015-11-13 MED ORDER — DEXTROSE 10 % IV SOLN
INTRAVENOUS | Status: DC
  Filled 2015-11-13 (×4): qty 1000

## 2015-11-13 MED ORDER — DEXTROSE-NACL 5-0.45 % IV SOLN
INTRAVENOUS | Status: DC
  Administered 2015-11-13 – 2015-11-14 (×2): via INTRAVENOUS

## 2015-11-13 MED ORDER — LAMOTRIGINE 100 MG PO TABS
150.0000 mg | ORAL_TABLET | Freq: Two times a day (BID) | ORAL | Status: DC
  Administered 2015-11-13 – 2015-11-19 (×12): 150 mg via ORAL
  Filled 2015-11-13: qty 2
  Filled 2015-11-13 (×2): qty 6
  Filled 2015-11-13 (×3): qty 2
  Filled 2015-11-13: qty 1
  Filled 2015-11-13 (×4): qty 2
  Filled 2015-11-13: qty 1.5
  Filled 2015-11-13: qty 2
  Filled 2015-11-13: qty 1.5
  Filled 2015-11-13: qty 6

## 2015-11-13 MED ORDER — ALPRAZOLAM 0.5 MG PO TABS: 1.0000 mg | ORAL_TABLET | Freq: Three times a day (TID) | ORAL | Status: DC | PRN

## 2015-11-13 MED ORDER — ONDANSETRON HCL 4 MG/2ML IJ SOLN
4.0000 mg | Freq: Four times a day (QID) | INTRAMUSCULAR | Status: DC
  Administered 2015-11-13 – 2015-11-16 (×13): 4 mg via INTRAVENOUS
  Filled 2015-11-13 (×14): qty 2

## 2015-11-13 MED ORDER — POTASSIUM PHOSPHATES 15 MMOLE/5ML IV SOLN
20.0000 meq | Freq: Once | INTRAVENOUS | Status: AC
  Administered 2015-11-13: 20 meq via INTRAVENOUS
  Filled 2015-11-13: qty 4.55

## 2015-11-13 MED ORDER — DEXTROSE 50 % IV SOLN
INTRAVENOUS | Status: AC
  Administered 2015-11-13: 25 mL via INTRAVENOUS
  Filled 2015-11-13: qty 50

## 2015-11-13 NOTE — Progress Notes (Signed)
[facility]  ROUNDING NOTE   Subjective:  Last HD on Apr 12 Extubated on 4/13 UOP has improved. > 3000 cc recorded last 24 hrs Patient is able to follow simple commands Oral intake is poor Good BM last night   Objective:  Vital signs in last 24 hours:  Temp:  [85.5 F (29.7 C)-99.2 F (37.3 C)] 97.8 F (36.6 C) (04/15 0800) Pulse Rate:  [82-97] 86 (04/15 0600) Resp:  [8-27] 12 (04/15 0600) BP: (101-144)/(62-82) 121/74 mmHg (04/15 0600) SpO2:  [94 %-100 %] 97 % (04/15 0600)  Weight change:  Filed Weights   11/10/15 0945 11/10/15 1345 11/11/15 0626  Weight: 129.6 kg (285 lb 11.5 oz) 129.6 kg (285 lb 11.5 oz) 126.1 kg (278 lb)    Intake/Output: I/O last 3 completed shifts: In: 1643 [I.V.:1293; IV Piggyback:350] Out: 6812 [Urine:3910]   Intake/Output this shift:     Physical Exam: General: NAD laying in bed  Head/ENT: Normocephalic, atraumatic.  Mouth dry  Eyes: Anicteric  Neck: Supple, trachea midline  Lungs:  Clear to auscultation, vent assisted  Heart: S1S2 no rubs  Abdomen:  Soft, nontender,non- distended  Extremities: trace peripheral edema.   Neurologic: Intubated, sedated  Skin: No acute lesions  Access: Femoral temporary dialysis catheter    Basic Metabolic Panel:  Recent Labs Lab 11/09/15 0336  11/09/15 1005 11/09/15 1836  11/10/15 0617 11/10/15 1743 11/11/15 0419 11/12/15 0527 11/12/15 1957 11/13/15 0814  NA 135  < > 137 139  < > 137 138 141 141 139 138  K 4.5  < > 4.6 2.9*  < > 3.2* 3.6 3.3* 4.3 2.9* 3.1*  CL 110  < > 106 101  < > 101 100* 103 101 101 101  CO2 9*  < > 11* 18*  < > 14* 21* 23 15* 18* 22  GLUCOSE 133*  < > 120* 67  < > 69 79 110* 56* 119* 93  BUN 54*  < > 63* 28*  < > 41* 23* 38* 67* 74* 69*  CREATININE 2.62*  < > 3.10* 1.88*  < > 2.70* 1.83* 2.55* 3.47* 3.80* 3.41*  CALCIUM 6.5*  < > 6.6* 7.3*  < > 6.8* 7.1* 7.1* 8.0* 8.3* 8.0*  MG 2.1  --   --  1.9  --  2.2  --  2.0  --   --  2.0  PHOS 6.6*  --  5.4* 1.9*   --  3.2  --   --   --   --  <1.0*  < > = values in this interval not displayed.  Liver Function Tests:  Recent Labs Lab 11/10/15 0617 11/10/15 1743 11/11/15 0419 11/12/15 0527 11/13/15 0814  AST 1493* 1210* 979* 475* 164*  ALT 821* 843* 824* 577* 328*  ALKPHOS 138* 167* 172* 191* 184*  BILITOT 2.1* 1.6* 1.0 2.1* 1.1  PROT 5.2* 5.4* 5.3* 5.6* 5.5*  ALBUMIN 2.6* 2.6* 2.4* 2.5* 2.2*   No results for input(s): LIPASE, AMYLASE in the last 168 hours.  Recent Labs Lab 11/09/15 0751  AMMONIA 161*    CBC:  Recent Labs Lab 11/09/15 0751 11/10/15 0519 11/11/15 0419 11/12/15 0527 11/13/15 0814  WBC 10.1 2.9* 2.5* 5.2 4.6  HGB 11.3* 9.9* 9.9* 9.4* 9.3*  HCT 34.4* 29.9* 28.7* 27.4* 26.9*  MCV 97.9 95.4 94.1 92.4 91.2  PLT 196 140* 114* 131* 118*    Cardiac Enzymes:  Recent Labs Lab 11/08/15 1238 11/09/15 0336  TROPONINI 0.07* 0.09*    BNP:  Invalid input(s): POCBNP  CBG:  Recent Labs Lab 11/13/15 0026 11/13/15 0154 11/13/15 0409 11/13/15 0558 11/13/15 0805  GLUCAP 93 78 70 87 83    Microbiology: Results for orders placed or performed during the hospital encounter of 11/08/15  MRSA PCR Screening     Status: None   Collection Time: 11/09/15  2:09 AM  Result Value Ref Range Status   MRSA by PCR NEGATIVE NEGATIVE Final    Comment:        The GeneXpert MRSA Assay (FDA approved for NASAL specimens only), is one component of a comprehensive MRSA colonization surveillance program. It is not intended to diagnose MRSA infection nor to guide or monitor treatment for MRSA infections.   Culture, expectorated sputum-assessment     Status: None   Collection Time: 11/10/15  8:30 AM  Result Value Ref Range Status   Specimen Description ENDOTRACHEAL  Final   Special Requests NONE  Final   Sputum evaluation THIS SPECIMEN IS ACCEPTABLE FOR SPUTUM CULTURE  Final   Report Status 11/10/2015 FINAL  Final  Culture, respiratory (NON-Expectorated)     Status: None    Collection Time: 11/10/15  8:30 AM  Result Value Ref Range Status   Specimen Description ENDOTRACHEAL  Final   Special Requests NONE Reflexed from [id number]  Final   Gram Stain   Final    FEW WBC SEEN MANY GRAM POSITIVE COCCI RARE YEAST    Culture HEAVY GROWTH STAPHYLOCOCCUS AUREUS  Final   Report Status 11/13/2015 FINAL  Final   Organism ID, Bacteria STAPHYLOCOCCUS AUREUS  Final      Susceptibility   Staphylococcus aureus - MIC*    CIPROFLOXACIN <=0.5 SENSITIVE Sensitive     ERYTHROMYCIN <=0.25 SENSITIVE Sensitive     GENTAMICIN <=0.5 SENSITIVE Sensitive     OXACILLIN <=0.25 SENSITIVE Sensitive     TETRACYCLINE <=1 SENSITIVE Sensitive     VANCOMYCIN <=0.5 SENSITIVE Sensitive     TRIMETH/SULFA <=10 SENSITIVE Sensitive     CLINDAMYCIN <=0.25 SENSITIVE Sensitive     RIFAMPIN <=0.5 SENSITIVE Sensitive     Inducible Clindamycin NEGATIVE Sensitive     * HEAVY GROWTH STAPHYLOCOCCUS AUREUS  CULTURE, BLOOD (ROUTINE X 2) w Reflex to PCR ID Panel     Status: None (Preliminary result)   Collection Time: 11/10/15  8:47 PM  Result Value Ref Range Status   Specimen Description BLOOD RIGHT HAND  Final   Special Requests BOTTLES DRAWN AEROBIC AND ANAEROBIC 5ML  Final   Culture  Setup Time   Final    GRAM POSITIVE COCCI ANAEROBIC BOTTLE ONLY CRITICAL RESULT CALLED TO, READ BACK BY AND VERIFIED WITH: JASON ROBBINS [place] AT 2040 11/11/15 MSS. Organism ID to follow    Culture   Final    COAGULASE NEGATIVE STAPHYLOCOCCUS ANAEROBIC BOTTLE ONLY THE SIGNIFICANCE OF ISOLATING THIS ORGANISM FROM A SINGLE VENIPUNCTURE CANNOT BE PREDICTED WITHOUT FURTHER CLINICAL AND CULTURE CORRELATION. SUSCEPTIBILITIES AVAILABLE ONLY ON REQUEST.    Report Status PENDING  Incomplete  Blood Culture ID Panel (Reflexed)     Status: Abnormal   Collection Time: 11/10/15  8:47 PM  Result Value Ref Range Status   Enterococcus species NOT DETECTED NOT DETECTED Final   Vancomycin resistance NOT DETECTED NOT DETECTED Final    Listeria monocytogenes NOT DETECTED NOT DETECTED Final   Staphylococcus species DETECTED (A) NOT DETECTED Final    Comment: CRITICAL RESULT CALLED TO, READ BACK BY AND VERIFIED WITHVioleta Gelinas [place] AT 2040 11/11/15 MSS    Staphylococcus  aureus NOT DETECTED NOT DETECTED Final   Methicillin resistance DETECTED (A) NOT DETECTED Final    Comment: CRITICAL RESULT CALLED TO, READ BACK BY AND VERIFIED WITH: JASON ROBBINS [place] AT 2040 11/11/15 MSS    Streptococcus species NOT DETECTED NOT DETECTED Final   Streptococcus agalactiae NOT DETECTED NOT DETECTED Final   Streptococcus pneumoniae NOT DETECTED NOT DETECTED Final   Streptococcus pyogenes NOT DETECTED NOT DETECTED Final   Acinetobacter baumannii NOT DETECTED NOT DETECTED Final   Enterobacteriaceae species NOT DETECTED NOT DETECTED Final   Enterobacter cloacae complex NOT DETECTED NOT DETECTED Final   Escherichia coli NOT DETECTED NOT DETECTED Final   Klebsiella oxytoca NOT DETECTED NOT DETECTED Final   Klebsiella pneumoniae NOT DETECTED NOT DETECTED Final   Proteus species NOT DETECTED NOT DETECTED Final   Serratia marcescens NOT DETECTED NOT DETECTED Final   Carbapenem resistance NOT DETECTED NOT DETECTED Final   Haemophilus influenzae NOT DETECTED NOT DETECTED Final   Neisseria meningitidis NOT DETECTED NOT DETECTED Final   Pseudomonas aeruginosa NOT DETECTED NOT DETECTED Final   Candida albicans NOT DETECTED NOT DETECTED Final   Candida glabrata NOT DETECTED NOT DETECTED Final   Candida krusei NOT DETECTED NOT DETECTED Final   Candida parapsilosis NOT DETECTED NOT DETECTED Final   Candida tropicalis NOT DETECTED NOT DETECTED Final  CULTURE, BLOOD (ROUTINE X 2) w Reflex to PCR ID Panel     Status: None (Preliminary result)   Collection Time: 11/10/15  9:08 PM  Result Value Ref Range Status   Specimen Description BLOOD LEFT HAND  Final   Special Requests BOTTLES DRAWN AEROBIC AND ANAEROBIC 5ML  Final   Culture NO GROWTH < 12 HOURS   Final   Report Status PENDING  Incomplete  Urine culture     Status: Abnormal (Preliminary result)   Collection Time: 11/10/15 11:14 PM  Result Value Ref Range Status   Specimen Description URINE, RANDOM  Final   Special Requests NONE  Final   Culture (A)  Final    >=100,000 COLONIES/mL GRAM NEGATIVE RODS IDENTIFICATION AND SUSCEPTIBILITIES TO FOLLOW    Report Status PENDING  Incomplete  Blood Culture ID Panel (Reflexed)     Status: None   Collection Time: 11/11/15  7:26 PM  Result Value Ref Range Status   Enterococcus species NOT DETECTED NOT DETECTED Corrected   Vancomycin resistance NOT DETECTED NOT DETECTED Corrected   Listeria monocytogenes NOT DETECTED NOT DETECTED Corrected   Staphylococcus species NOT DETECTED NOT DETECTED Corrected   Staphylococcus aureus NOT DETECTED NOT DETECTED Corrected    Comment: CORRECTED ON 04/14 AT 1004: PREVIOUSLY REPORTED AS DETECTED CRITICAL RESULT CALLED TO, READ BACK BY AND VERIFIED WITH: JASON ROBBINS [place] AT 2040 11/11/15 MSS.   Methicillin resistance  NOT DETECTED Corrected    DUPLICATE ORDER/ ORDERED IN ERROR SPOKE TO SCOTT CHRISTY 11/12/15 DV    Comment: CORRECTED ON 04/14 AT 1004: PREVIOUSLY REPORTED AS DETECTED CRITICAL RESULT CALLED TO, READ BACK BY AND VERIFIED WITH: Violeta Gelinas [place] AT 2040 11/11/15 MSS.   Streptococcus species NOT DETECTED NOT DETECTED Corrected   Streptococcus agalactiae NOT DETECTED NOT DETECTED Corrected   Streptococcus pneumoniae NOT DETECTED NOT DETECTED Corrected   Streptococcus pyogenes NOT DETECTED NOT DETECTED Corrected   Acinetobacter baumannii NOT DETECTED NOT DETECTED Corrected   Enterobacteriaceae species NOT DETECTED NOT DETECTED Corrected   Enterobacter cloacae complex NOT DETECTED NOT DETECTED Corrected   Escherichia coli NOT DETECTED NOT DETECTED Corrected   Klebsiella oxytoca NOT DETECTED NOT DETECTED  Corrected   Klebsiella pneumoniae NOT DETECTED NOT DETECTED Corrected   Proteus species NOT DETECTED  NOT DETECTED Corrected   Serratia marcescens NOT DETECTED NOT DETECTED Corrected   Carbapenem resistance NOT DETECTED NOT DETECTED Corrected   Haemophilus influenzae NOT DETECTED NOT DETECTED Corrected   Neisseria meningitidis NOT DETECTED NOT DETECTED Corrected   Pseudomonas aeruginosa NOT DETECTED NOT DETECTED Corrected   Candida albicans NOT DETECTED NOT DETECTED Corrected   Candida glabrata NOT DETECTED NOT DETECTED Corrected   Candida krusei NOT DETECTED NOT DETECTED Corrected   Candida parapsilosis NOT DETECTED NOT DETECTED Corrected   Candida tropicalis NOT DETECTED NOT DETECTED Corrected    Coagulation Studies:  Recent Labs  11/11/15 0419  LABPROT 20.3*  INR 1.74    Urinalysis: No results for input(s): COLORURINE, LABSPEC, PHURINE, GLUCOSEU, HGBUR, BILIRUBINUR, KETONESUR, PROTEINUR, UROBILINOGEN, NITRITE, LEUKOCYTESUR in the last 72 hours.  Invalid input(s): APPERANCEUR    Imaging: Dg Chest Port 1 View  11/13/2015  CLINICAL DATA:  Respiratory failure. EXAM: PORTABLE CHEST 1 VIEW COMPARISON:  11/11/2015. FINDINGS: The endotracheal and nasogastric tubes have been removed. The left jugular catheter has been repositioned with its tip in the inferior aspect of the superior vena cava. Normal sized heart. Mildly prominent interstitial markings. Diffuse osteopenia. IMPRESSION: No acute abnormality.  Mild chronic interstitial lung disease. Electronically Signed   By: Claudie Revering M.D.   On: 11/13/2015 07:29     Medications:   . dextrose 10 % 1,000 mL with sodium bicarbonate 100 mEq infusion 75 mL/hr at 11/13/15 0600  . phenylephrine (NEO-SYNEPHRINE) Adult infusion Stopped (11/12/15 0919)   . antiseptic oral rinse  7 mL Mouth Rinse QID  . cefTRIAXone (ROCEPHIN)  IV  1 g Intravenous Q24H  . chlorhexidine gluconate (SAGE KIT)  15 mL Mouth Rinse BID  . famotidine (PEPCID) IV  20 mg Intravenous Q24H  . heparin  5,000 Units Subcutaneous 3 times per day  . levothyroxine  50 mcg  Intravenous Daily  . ondansetron (ZOFRAN) IV  4 mg Intravenous 4 times per day  . potassium phosphate IVPB (mEq)  20 mEq Intravenous Once  . sodium chloride flush  10-40 mL Intracatheter Q12H  . sodium chloride flush  3 mL Intravenous Q12H   sodium chloride, sodium chloride, alteplase, bisacodyl, heparin, lidocaine (PF), lidocaine-prilocaine, metoprolol, pentafluoroprop-tetrafluoroeth, sodium chloride flush  Assessment/ Plan:  55 y.o. female with a PMHx of osteoarthritis, osteoporosis, rheumatoid arthritis, bipolar disorder, anxiety, fibromyalgia, status post gastric bypass, who was admitted to [facility] on 11/08/2015 for evaluation of altered mental status. Patient apparently took many of her outpatient prescriptions over the past 4-5 days. She may have possibly also overdosed on Provigil.  1. Acute renal failure Baseline Creatinine 0.8 (01/2015) 2. Metabolic acidosis  3. drug overdose, patient apparently took provigil, elevated osmolar gap of 32. Volatile acid screen positive for acetone.  4. Acute respiratory failure. Extubated 4/13  5.  Acute hepatic failure 6.  MRSA Bacteremia 7. Hypophosphatemia 8. Hypokalemia  Plan:  S Cr improved slightly UOP has improved; > 3000 last 24 hrs Electrolytes and Volume status are acceptable No acute indication for Dialysis at present. Replace phos PRN Replace potassium prn Encourage oral intake Consider changing iv bicarb to D5-1/2 NS with KCl (which may be discontinued when oral intake improves)    LOS: 5 Kreg Earhart 4/15/20179:23 AM

## 2015-11-13 NOTE — Progress Notes (Signed)
Speech Language Pathology Treatment: Dysphagia  Patient Details Name: CANDIS SPRICK MRN: [id number] DOB: Nov 30, 1960 Today's Date: 11/13/2015 Time: 1000-1045 SLP Time Calculation (min) (ACUTE ONLY): 45 min  Assessment / Plan / Recommendation Clinical Impression  Pt appeared to tolerate small, single sips of Nectar consistency liquids via cup w/ no immediate, overt s/s of aspiration noted and w/ adequate oral phase control of bolus material. Pt exhibited delayed throat clearing when attempting to use a straw to drink the liquids; coughing noted when she was fed thin liquids by Husband this morning. Pt continues to present w/ drowsiness; decreased attention to task and slow movements overall.  Pt appears to present w/ continued w/ increased risk for aspiration sec. To overall medical and Cognitive status decline. She would benefit from continued use of a modified diet of Dys. 1 w/ Nectar liquids(min. Upgrade) w/ strict aspiration precautions and feeding assistance to monitor toleration. ST services will continue to f/u w/ pt as her overall medical and Cognitive status' improve for safe oral intake. Husband education on precautions. NSG updated on status; pills to be given orally w/ Puree.    HPI HPI: Pt is a 55 y.o. female w/ h/o Gastric Bypass surgery 15+ yrs ago, Bipolar, Dis., Constipation, anxiety, RA, OA, Fibromyalgia, thyroid dis., reflux, and other medical issues who presents with altered mental status from Drug Overdose per MD. Patient was unable to contribute to her history, and collateral information is taken from her husband who is present at bedside. He states that she has bipolar, with depressive episodes. She has a prescription for Provigil, and he feels that she likely can overdose of this medication. She has done this before. He states that he controls her medications and typically keeps that all locked up, but that she sometimes will still pick it up from the pharmacy. On evaluation  in the ED she was responsive, but not in any coherent way. She wass also found to have acute kidney injury, as well as significantly elevated liver enzymes. Her blood pressure was also trending down. Pt was orally intubated for ~3 days, extubated yesterday. She is drowsy w/ mumbled speech requiring max cues to attend to task of taking po's. Husband stated she "coughed" on tsps of "water" he gave her earlier. Vocal quality c/b gravely hoarseness suspect d/t recent intubation. She has been tolerating the Honey liquids by tsp per NSG/husband.       SLP Plan  Continue with current plan of care     Recommendations  Diet recommendations: Dysphagia 1 (puree);Nectar-thick liquid Liquids provided via: Cup;Teaspoon Medication Administration: Crushed with puree (whole if able) Supervision: Staff to assist with self feeding;Full supervision/cueing for compensatory strategies Compensations: Minimize environmental distractions;Slow rate;Small sips/bites;Follow solids with liquid (check for oral clearing b/t trials) Postural Changes and/or Swallow Maneuvers: Seated upright 90 degrees;Upright 30-60 min after meal (gastric bypass)             General recommendations:  (Dietician f/u) Oral Care Recommendations: Oral care BID;Staff/trained caregiver to provide oral care Follow up Recommendations:  (TBD) Plan: Continue with current plan of care     [place], MS, CCC-SLP  Johnathin Vanderschaaf 11/13/2015, 11:18 AM

## 2015-11-13 NOTE — Progress Notes (Signed)
PULMONARY / CRITICAL CARE MEDICINE   Name: Jamie Bennett MRN: [id number] DOB: 1960-08-27    ADMISSION DATE:  11/08/2015  PT PROFILE:   42 F adm via ED with altered mental status, history of bipolar disorder. She has a prescription for Provigil. Over the last 4-5 days she took majority of her monthly prescription meds. She was lethargic and increased WOB upon initial assessment and intubated. Intubated, vasopressor dependent shock, AKI (oliguric), severe acidosis, acute hepatic failure. Husband feels strongly that she took no other ingestions and that this was not with suicidal intent  MAJOR EVENTS/TEST RESULTS: 04/11 Admitted as above, intubated, vasopressors, CVL and HD cath placed, HD performed, HCO3 gtt, fomipazole and NAC administered per poison control even though acetaminophen levels undetectable X 2. Volatile alcohols negative. Serum acetone level markedly elevated 04/12 Much improved. Acidosis and LFTs improving. Remains oliguric, HD repeated. Fomipazole stopped. Weaning norepinephrine. Tolerating PSV mode 04/13 very brief episode of AFRVR. Frequent PACs. Norepi changed to phenylephrine. Extubated and tolerating. Uo improving 04/14 transient AF - resolved spontaneously. Tolerating extubation. Cognition improving. Uo improving but Cr rising. Changed to SDU  4/15; persistent hypoglycemia, D10 drip added. Good urine output.  INDWELLING DEVICES:: ETT 04/11 >> 04/13 L IJ CVL 04/11 >>  R femoral HD cath 04/11 >>   MICRO DATA: MRSA PCR 04/11 >> NEG Urine 4/12>> Klebsiella pneumonia. Sensitive to ceftriaxone. Resp 04/12 >> MSSA Blood 04/13 >> 1/2 coag ne staph - deemed contaminant  ANTIMICROBIALS:  Ceftriaxone 04/12 >>  Vanc X 1 04/14 PCT algorithm 04/12: 12.13, 28.57, 24.09  SUBJECTIVE:  RASS 0. + F/C sluggishly. No respiratory distress  VITAL SIGNS: BP 121/74 mmHg  Pulse 86  Temp(Src) 97.8 F (36.6 C) (Oral)  Resp 12  Ht 5\' 6"  (1.676 m)  Wt 278 lb (126.1 kg)  BMI  44.89 kg/m2  SpO2 97%  HEMODYNAMICS:    VENTILATOR SETTINGS:    INTAKE / OUTPUT: I/O last 3 completed shifts: In: [id number] [I.V.:1293; IV Piggyback:350] Out: [id number] [Urine:3910]  PHYSICAL EXAMINATION: General: RASS 0, + F/C Neuro: diffusely weak, no focal deficits. Patient arousable. HEENT: NCAT, WNL Cardiovascular: reg, no M noted Lungs: minimal scattered rhonchi. Decreased air entry bilaterally. Abdomen: obese, soft, +BS Ext: warm, no edema  LABS:  BMET  Recent Labs Lab 11/12/15 0527 11/12/15 1957 11/13/15 0814  NA 141 139 138  K 4.3 2.9* 3.1*  CL 101 101 101  CO2 15* 18* 22  BUN 67* 74* 69*  CREATININE 3.47* 3.80* 3.41*  GLUCOSE 56* 119* 93    Electrolytes  Recent Labs Lab 11/09/15 1836  11/10/15 0617  11/11/15 0419 11/12/15 0527 11/12/15 1957 11/13/15 0814  CALCIUM 7.3*  < > 6.8*  < > 7.1* 8.0* 8.3* 8.0*  MG 1.9  --  2.2  --  2.0  --   --  2.0  PHOS 1.9*  --  3.2  --   --   --   --  <1.0*  < > = values in this interval not displayed.  CBC  Recent Labs Lab 11/11/15 0419 11/12/15 0527 11/13/15 0814  WBC 2.5* 5.2 4.6  HGB 9.9* 9.4* 9.3*  HCT 28.7* 27.4* 26.9*  PLT 114* 131* 118*    Coag's  Recent Labs Lab 11/08/15 2136 11/10/15 0519 11/11/15 0419  APTT 29 35 37*  INR 1.49 1.96 1.74    Sepsis Markers  Recent Labs Lab 11/09/15 0336 11/09/15 1836 11/09/15 2056 11/10/15 0617 11/11/15 0419 11/12/15 0527  LATICACIDVEN 1.6 1.4 1.1  --   --   --  PROCALCITON  --   --   --  12.13 28.57 24.09    ABG  Recent Labs Lab 11/09/15 0526 11/09/15 1500 11/13/15 0420  PHART 6.99* 7.54* 7.58*  PCO2ART 32 26* 28*  PO2ART 134* 204* 205*    Liver Enzymes  Recent Labs Lab 11/11/15 0419 11/12/15 0527 11/13/15 0814  AST 979* 475* 164*  ALT 824* 577* 328*  ALKPHOS 172* 191* 184*  BILITOT 1.0 2.1* 1.1  ALBUMIN 2.4* 2.5* 2.2*    Cardiac Enzymes  Recent Labs Lab 11/08/15 1238 11/09/15 0336  TROPONINI 0.07* 0.09*     Glucose  Recent Labs Lab 11/13/15 0026 11/13/15 0154 11/13/15 0409 11/13/15 0558 11/13/15 0805 11/13/15 0945  GLUCAP 93 78 70 87 83 94    CXR: NNF   ASSESSMENT / PLAN:  PULMONARY A: VDRF, resolved - intubated predominantly due to AMS and increased WOB due to acidosis, now extubated. RLL infiltrate - suspect MSSA PNA P:   Supplemental O2 Cont to monitor in SDU level of care  CARDIOVASCULAR A:  Shock - unclear etiology. Improving PAF with RVR Frequent PACs P:  MAP goal > 65 mmHg Wean PE to off as tolerated  RENAL A:   AKI, now  - Uo improving  Severe metabolic acidosis  resolved Hypokalemia, recurrent P:   Monitor BMET intermittently Monitor I/Os Correct electrolytes as indicated Nephrology following - no indication for HD 04/14 Bicarbonate drip discontinued today.  GASTROINTESTINAL A:   Elevated LFTs - improving. Suspect toxic injury Dysphagia post extubation P:   SUP: enteral PPI NPO until cognition improves SLP eval 04/14  HEMATOLOGIC A:   Thrombocytopenia Coagulopathy - likely due to hepatic failure. Improving P:  DVT px: SQ heparin Monitor CBC intermittently Transfuse per usual guidelines Monitor plt on heparin  INFECTIOUS A:   MSSA PNA Elevated PCT P:   Monitor temp, WBC count Micro and abx as above  ENDOCRINE A:   Hypothyroidism Low TSH on levothyroxine Persistent hypoglycemia. P:   Cont levothyroxine at reduced dose  Continue D10 drip.  NEUROLOGIC A:   Acute toxic-metabolic encephalopathy, slowly improving Bipolar disorder Overdose - unclear intent P:   RASS goal: 0 Will eventually need psychiatry eval     Deep Grey Rakestraw M.D. Pager [phone] 11/13/2015  Critical Care Attestation.  I have personally obtained a history, examined the patient, evaluated laboratory and imaging results, formulated the assessment and plan and placed orders. The Patient requires high complexity decision making for assessment  and support, frequent evaluation and titration of therapies, application of advanced monitoring technologies and extensive interpretation of multiple databases. The patient has critical illness that could lead imminently to failure of 1 or more organ systems and requires the highest level of physician preparedness to intervene.  Critical Care Time devoted to patient care services described in this note is 35 minutes and is exclusive of time spent in procedures.

## 2015-11-14 LAB — CBC
HCT: 27.6 % — ABNORMAL LOW (ref 35.0–47.0)
Hemoglobin: 9.7 g/dL — ABNORMAL LOW (ref 12.0–16.0)
MCH: 32.7 pg (ref 26.0–34.0)
MCHC: 34.9 g/dL (ref 32.0–36.0)
MCV: 93.6 fL (ref 80.0–100.0)
Platelets: 112 10*3/uL — ABNORMAL LOW (ref 150–440)
RBC: 2.95 MIL/uL — ABNORMAL LOW (ref 3.80–5.20)
RDW: 16.2 % — ABNORMAL HIGH (ref 11.5–14.5)
WBC: 6.1 10*3/uL (ref 3.6–11.0)

## 2015-11-14 LAB — BASIC METABOLIC PANEL
Anion gap: 11 (ref 5–15)
BUN: 72 mg/dL — ABNORMAL HIGH (ref 6–20)
CO2: 26 mmol/L (ref 22–32)
Calcium: 8 mg/dL — ABNORMAL LOW (ref 8.9–10.3)
Chloride: 104 mmol/L (ref 101–111)
Creatinine, Ser: 3.07 mg/dL — ABNORMAL HIGH (ref 0.44–1.00)
GFR calc Af Amer: 19 mL/min — ABNORMAL LOW (ref >60)
GFR calc non Af Amer: 16 mL/min — ABNORMAL LOW (ref >60)
Glucose, Bld: 82 mg/dL (ref 65–99)
Potassium: 3.2 mmol/L — ABNORMAL LOW (ref 3.5–5.1)
Sodium: 141 mmol/L (ref 135–145)

## 2015-11-14 LAB — MAGNESIUM: Magnesium: 1.8 mg/dL (ref 1.7–2.4)

## 2015-11-14 LAB — GLUCOSE, CAPILLARY
Glucose-Capillary: 85 mg/dL (ref 65–99)
Glucose-Capillary: 85 mg/dL (ref 65–99)
Glucose-Capillary: 85 mg/dL (ref 65–99)
Glucose-Capillary: 80 mg/dL (ref 65–99)
Glucose-Capillary: 83 mg/dL (ref 65–99)

## 2015-11-14 LAB — PHOSPHORUS: Phosphorus: 2.8 mg/dL (ref 2.5–4.6)

## 2015-11-14 MED ORDER — KCL IN DEXTROSE-NACL 20-5-0.45 MEQ/L-%-% IV SOLN
INTRAVENOUS | Status: DC
  Administered 2015-11-14 – 2015-11-17 (×5): via INTRAVENOUS
  Filled 2015-11-14 (×7): qty 1000

## 2015-11-14 MED ORDER — ALUM & MAG HYDROXIDE-SIMETH 200-200-20 MG/5ML PO SUSP: 15.0000 mL | ORAL | Status: DC | PRN

## 2015-11-14 MED ORDER — POTASSIUM CHLORIDE 10 MEQ/100ML IV SOLN
10.0000 meq | INTRAVENOUS | Status: AC
  Administered 2015-11-14 (×2): 10 meq via INTRAVENOUS
  Filled 2015-11-14 (×2): qty 100

## 2015-11-14 MED ORDER — FAMOTIDINE IN NACL 20-0.9 MG/50ML-% IV SOLN
20.0000 mg | Freq: Every day | INTRAVENOUS | Status: DC
  Administered 2015-11-14 (×2): 20 mg via INTRAVENOUS
  Filled 2015-11-14 (×3): qty 50

## 2015-11-14 MED ORDER — SODIUM CHLORIDE 0.9 % IJ SOLN
INTRAMUSCULAR | Status: AC
  Administered 2015-11-14: 11:00:00
  Filled 2015-11-14: qty 10

## 2015-11-14 MED ORDER — PANTOPRAZOLE SODIUM 40 MG IV SOLR
40.0000 mg | Freq: Two times a day (BID) | INTRAVENOUS | Status: DC | PRN
  Administered 2015-11-14: 40 mg via INTRAVENOUS
  Filled 2015-11-14: qty 40

## 2015-11-14 NOTE — Progress Notes (Signed)
PULMONARY / CRITICAL CARE MEDICINE   Name: Jamie Bennett MRN: [id number] DOB: [birth year]    ADMISSION DATE:  11/08/2015  PT PROFILE:   25 F adm via ED with altered mental status, history of bipolar disorder. She has a prescription for Provigil. 4-5 prior to admit she took majority of her monthly prescription meds. She was lethargic and increased WOB upon initial assessment and intubated. Intubated, vasopressor dependent shock, AKI (oliguric), severe acidosis, acute hepatic failure. Husband feels strongly that she took no other ingestions and that this was not with suicidal intent  MAJOR EVENTS/TEST RESULTS: 04/11 Admitted as above, intubated, vasopressors, CVL and HD cath placed, HD performed, HCO3 gtt, fomipazole and NAC administered per poison control even though acetaminophen levels undetectable X 2. Volatile alcohols negative. Serum acetone level markedly elevated 04/12 Much improved. Acidosis and LFTs improving. Remains oliguric, HD repeated. Fomipazole stopped. Weaning norepinephrine. Tolerating PSV mode 04/13 very brief episode of AFRVR. Frequent PACs. Norepi changed to phenylephrine. Extubated and tolerating. Uo improving 04/14 transient AF - resolved spontaneously. Tolerating extubation. Cognition improving. Uo improving but Cr rising. Changed to SDU  4/15; persistent hypoglycemia, D10 drip added. Good urine output.  INDWELLING DEVICES:: ETT 04/11 >> 04/13 L IJ CVL 04/11 >>  R femoral HD cath 04/11 >>   MICRO DATA: MRSA PCR 04/11 >> NEG Urine 4/12>> Klebsiella pneumonia. Sensitive to ceftriaxone. Resp 04/12 >> MSSA Blood 04/13 >> 1/2 coag ne staph - deemed contaminant  ANTIMICROBIALS:  Ceftriaxone 04/12 >>  Vanc X 1 04/14 PCT algorithm 04/12: 12.13, 28.57, 24.09  SUBJECTIVE:  RASS 0. + F/C sluggishly. No respiratory distress  VITAL SIGNS: BP 130/93 mmHg  Pulse 82  Temp(Src) 97.6 F (36.4 C) (Axillary)  Resp 12  Ht 5\' 6"  (1.676 m)  Wt 278 lb (126.1 kg)  BMI  44.89 kg/m2  SpO2 96%  HEMODYNAMICS:    VENTILATOR SETTINGS:    INTAKE / OUTPUT: I/O last 3 completed shifts: In: 3204.6 [I.V.:2004.6; Other:1200] Out: 2800 [Urine:2800]  PHYSICAL EXAMINATION: General: RASS 0, + F/C. Pt more awake today that previous days.  Neuro: diffusely weak, no focal deficits. Patient arousable. HEENT: NCAT, WNL Cardiovascular: reg, no M noted Lungs: minimal scattered rhonchi. Decreased air entry bilaterally. Abdomen: obese, soft, +BS Ext: warm, no edema  LABS:  BMET  Recent Labs Lab 11/12/15 1957 11/13/15 0814 11/14/15 0505  NA 139 138 141  K 2.9* 3.1* 3.2*  CL 101 101 104  CO2 18* 22 26  BUN 74* 69* 72*  CREATININE 3.80* 3.41* 3.07*  GLUCOSE 119* 93 82    Electrolytes  Recent Labs Lab 11/10/15 0617  11/11/15 0419  11/12/15 1957 11/13/15 0814 11/14/15 0505  CALCIUM 6.8*  < > 7.1*  < > 8.3* 8.0* 8.0*  MG 2.2  --  2.0  --   --  2.0 1.8  PHOS 3.2  --   --   --   --  <1.0* 2.8  < > = values in this interval not displayed.  CBC  Recent Labs Lab 11/12/15 0527 11/13/15 0814 11/14/15 0505  WBC 5.2 4.6 6.1  HGB 9.4* 9.3* 9.7*  HCT 27.4* 26.9* 27.6*  PLT 131* 118* 112*    Coag's  Recent Labs Lab 11/08/15 2136 11/10/15 0519 11/11/15 0419  APTT 29 35 37*  INR 1.49 1.96 1.74    Sepsis Markers  Recent Labs Lab 11/09/15 0336 11/09/15 1836 11/09/15 2056 11/10/15 0617 11/11/15 0419 11/12/15 0527  LATICACIDVEN 1.6 1.4 1.1  --   --   --  PROCALCITON  --   --   --  12.13 28.57 24.09    ABG  Recent Labs Lab 11/09/15 0526 11/09/15 1500 11/13/15 0420  PHART 6.99* 7.54* 7.58*  PCO2ART 32 26* 28*  PO2ART 134* 204* 205*    Liver Enzymes  Recent Labs Lab 11/11/15 0419 11/12/15 0527 11/13/15 0814  AST 979* 475* 164*  ALT 824* 577* 328*  ALKPHOS 172* 191* 184*  BILITOT 1.0 2.1* 1.1  ALBUMIN 2.4* 2.5* 2.2*    Cardiac Enzymes  Recent Labs Lab 11/08/15 1238 11/09/15 0336  TROPONINI 0.07* 0.09*     Glucose  Recent Labs Lab 11/13/15 1148 11/13/15 2000 11/13/15 2333 11/14/15 0400 11/14/15 0715 11/14/15 1202  GLUCAP 100* 109* 91 85 85 85    CXR: NNF   ASSESSMENT / PLAN:  PULMONARY A: VDRF, resolved - intubated predominantly due to AMS and increased WOB due to acidosis, now extubated. RLL infiltrate - suspect MSSA PNA P:   Supplemental O2 Cont to monitor in SDU level of care  CARDIOVASCULAR A:  Shock - unclear etiology. Improving PAF with RVR Frequent PACs P:  MAP goal > 65 mmHg Wean PE to off as tolerated  RENAL A:   AKI, now  - Uo improving  Severe metabolic acidosis  resolved Hypokalemia, recurrent P:   Monitor BMET intermittently Monitor I/Os Correct electrolytes as indicated Nephrology following - no indication for HD 04/14 Bicarbonate drip discontinued today.  GASTROINTESTINAL A:   Elevated LFTs - improving. Suspect toxic injury Dysphagia post extubation P:   SUP: enteral PPI NPO until cognition improves SLP eval 04/14  HEMATOLOGIC A:   Thrombocytopenia Coagulopathy - likely due to hepatic failure. Improving P:  DVT px: SQ heparin Monitor CBC intermittently Transfuse per usual guidelines Monitor plt on heparin  INFECTIOUS A:   MSSA PNA Elevated PCT P:   Monitor temp, WBC count Micro and abx as above  ENDOCRINE A:   Hypothyroidism Low TSH on levothyroxine Persistent hypoglycemia. P:   Cont levothyroxine at reduced dose  Continue D10 drip.  NEUROLOGIC A:   Acute toxic-metabolic encephalopathy, slowly improving Bipolar disorder Overdose - unclear intent P:   RASS goal: 0 Will eventually need psychiatry eval     Deep Simya Tercero M.D. Pager [phone] 11/14/2015

## 2015-11-14 NOTE — Progress Notes (Signed)
Pt complains of indigestions, stated she has been on PPI since admission, but has not received in 24 hours. Requests protonix. Order approved by Dr. Doy Hutching: Protonix 40mg  IV q 12hrs PRN.

## 2015-11-14 NOTE — Progress Notes (Signed)
Pt was admited by Dr. Jannifer Franklin we will accept patient back to our service

## 2015-11-14 NOTE — Progress Notes (Signed)
Vital signs are stable. BP within normal limits. Afebrile. Eating pureed foods with nectar thick fluids without difficulty. Will follow commands is oriented X4. Husband at bedside. Vascular cath dressing changed. Vascular catheter should not removed yet, per Dr. Candiss Norse. Foley in place. Good urine output. 2 small BM's on bedpan.

## 2015-11-14 NOTE — Progress Notes (Signed)
[facility]  ROUNDING NOTE   Subjective:  Last HD on Apr 12 Extubated on 4/13 UOP has improved. ~2000 cc recorded last 24 hrs (listed as 700 due to charting error) Patient is able to follow simple commands Oral intake is poor Husband at bedside. Lab results discussed with him.   Objective:  Vital signs in last 24 hours:  Temp:  [97.6 F (36.4 C)-98.6 F (37 C)] 97.6 F (36.4 C) (04/16 1000) Pulse Rate:  [76-85] 82 (04/16 1000) Resp:  [9-22] 12 (04/16 1000) BP: (110-131)/(60-93) 130/93 mmHg (04/16 1000) SpO2:  [94 %-100 %] 96 % (04/16 1000)  Weight change:  Filed Weights   11/10/15 0945 11/10/15 1345 11/11/15 0626  Weight: 129.6 kg (285 lb 11.5 oz) 129.6 kg (285 lb 11.5 oz) 126.1 kg (278 lb)    Intake/Output: I/O last 3 completed shifts: In: 3204.6 [I.V.:2004.6; Other:1200] Out: 2800 [Urine:2800]   Intake/Output this shift:  Total I/O In: 170 [P.O.:120; I.V.:50] Out: 45 [Urine:820]  Physical Exam: General: NAD laying in bed  Head/ENT: Normocephalic, atraumatic.     Eyes: Anicteric  Neck: Supple, trachea midline  Lungs:  Normal effort, coarse breath sounds bilaterally    Heart: S1S2 no rubs  Abdomen:  Soft, nontender,non- distended  Extremities: trace peripheral edema.   Neurologic: Alert, able to follow commands  Skin: No acute lesions  Access: Femoral temporary dialysis catheter    Basic Metabolic Panel:  Recent Labs Lab 11/09/15 1005 11/09/15 1836  11/10/15 0617  11/11/15 0419 11/12/15 0527 11/12/15 1957 11/13/15 0814 11/14/15 0505  NA 137 139  < > 137  < > 141 141 139 138 141  K 4.6 2.9*  < > 3.2*  < > 3.3* 4.3 2.9* 3.1* 3.2*  CL 106 101  < > 101  < > 103 101 101 101 104  CO2 11* 18*  < > 14*  < > 23 15* 18* 22 26  GLUCOSE 120* 67  < > 69  < > 110* 56* 119* 93 82  BUN 63* 28*  < > 41*  < > 38* 67* 74* 69* 72*  CREATININE 3.10* 1.88*  < > 2.70*  < > 2.55* 3.47* 3.80* 3.41* 3.07*  CALCIUM 6.6* 7.3*  < > 6.8*  < > 7.1* 8.0* 8.3*  8.0* 8.0*  MG  --  1.9  --  2.2  --  2.0  --   --  2.0 1.8  PHOS 5.4* 1.9*  --  3.2  --   --   --   --  <1.0* 2.8  < > = values in this interval not displayed.  Liver Function Tests:  Recent Labs Lab 11/10/15 0617 11/10/15 1743 11/11/15 0419 11/12/15 0527 11/13/15 0814  AST 1493* 1210* 979* 475* 164*  ALT 821* 843* 824* 577* 328*  ALKPHOS 138* 167* 172* 191* 184*  BILITOT 2.1* 1.6* 1.0 2.1* 1.1  PROT 5.2* 5.4* 5.3* 5.6* 5.5*  ALBUMIN 2.6* 2.6* 2.4* 2.5* 2.2*   No results for input(s): LIPASE, AMYLASE in the last 168 hours.  Recent Labs Lab 11/09/15 0751  AMMONIA 161*    CBC:  Recent Labs Lab 11/10/15 0519 11/11/15 0419 11/12/15 0527 11/13/15 0814 11/14/15 0505  WBC 2.9* 2.5* 5.2 4.6 6.1  HGB 9.9* 9.9* 9.4* 9.3* 9.7*  HCT 29.9* 28.7* 27.4* 26.9* 27.6*  MCV 95.4 94.1 92.4 91.2 93.6  PLT 140* 114* 131* 118* 112*    Cardiac Enzymes:  Recent Labs Lab 11/08/15 1238 11/09/15 0336  TROPONINI  0.07* 0.09*    BNP: Invalid input(s): POCBNP  CBG:  Recent Labs Lab 11/13/15 2000 11/13/15 2333 11/14/15 0400 11/14/15 0715 11/14/15 1202  GLUCAP 109* 91 85 85 85    Microbiology: Results for orders placed or performed during the hospital encounter of 11/08/15  MRSA PCR Screening     Status: None   Collection Time: 11/09/15  2:09 AM  Result Value Ref Range Status   MRSA by PCR NEGATIVE NEGATIVE Final    Comment:        The GeneXpert MRSA Assay (FDA approved for NASAL specimens only), is one component of a comprehensive MRSA colonization surveillance program. It is not intended to diagnose MRSA infection nor to guide or monitor treatment for MRSA infections.   Culture, expectorated sputum-assessment     Status: None   Collection Time: 11/10/15  8:30 AM  Result Value Ref Range Status   Specimen Description ENDOTRACHEAL  Final   Special Requests NONE  Final   Sputum evaluation THIS SPECIMEN IS ACCEPTABLE FOR SPUTUM CULTURE  Final   Report Status  11/10/2015 FINAL  Final  Culture, respiratory (NON-Expectorated)     Status: None   Collection Time: 11/10/15  8:30 AM  Result Value Ref Range Status   Specimen Description ENDOTRACHEAL  Final   Special Requests NONE Reflexed from [id number]  Final   Gram Stain   Final    FEW WBC SEEN MANY GRAM POSITIVE COCCI RARE YEAST    Culture HEAVY GROWTH STAPHYLOCOCCUS AUREUS  Final   Report Status 11/13/2015 FINAL  Final   Organism ID, Bacteria STAPHYLOCOCCUS AUREUS  Final      Susceptibility   Staphylococcus aureus - MIC*    CIPROFLOXACIN <=0.5 SENSITIVE Sensitive     ERYTHROMYCIN <=0.25 SENSITIVE Sensitive     GENTAMICIN <=0.5 SENSITIVE Sensitive     OXACILLIN <=0.25 SENSITIVE Sensitive     TETRACYCLINE <=1 SENSITIVE Sensitive     VANCOMYCIN <=0.5 SENSITIVE Sensitive     TRIMETH/SULFA <=10 SENSITIVE Sensitive     CLINDAMYCIN <=0.25 SENSITIVE Sensitive     RIFAMPIN <=0.5 SENSITIVE Sensitive     Inducible Clindamycin NEGATIVE Sensitive     * HEAVY GROWTH STAPHYLOCOCCUS AUREUS  CULTURE, BLOOD (ROUTINE X 2) w Reflex to PCR ID Panel     Status: None (Preliminary result)   Collection Time: 11/10/15  8:47 PM  Result Value Ref Range Status   Specimen Description BLOOD RIGHT HAND  Final   Special Requests BOTTLES DRAWN AEROBIC AND ANAEROBIC  Final   Culture  Setup Time   Final    GRAM POSITIVE COCCI ANAEROBIC BOTTLE ONLY CRITICAL RESULT CALLED TO, READ BACK BY AND VERIFIED WITH: JASON ROBBINS PH AT 2040 11/11/15 MSS. Organism ID to follow    Culture   Final    COAGULASE NEGATIVE STAPHYLOCOCCUS ANAEROBIC BOTTLE ONLY THE SIGNIFICANCE OF ISOLATING THIS ORGANISM FROM A SINGLE VENIPUNCTURE CANNOT BE PREDICTED WITHOUT FURTHER CLINICAL AND CULTURE CORRELATION. SUSCEPTIBILITIES AVAILABLE ONLY ON REQUEST.    Report Status PENDING  Incomplete  Blood Culture ID Panel (Reflexed)     Status: Abnormal   Collection Time: 11/10/15  8:47 PM  Result Value Ref Range Status   Enterococcus species NOT  DETECTED NOT DETECTED Final   Vancomycin resistance NOT DETECTED NOT DETECTED Final   Listeria monocytogenes NOT DETECTED NOT DETECTED Final   Staphylococcus species DETECTED (A) NOT DETECTED Final    Comment: CRITICAL RESULT CALLED TO, READ BACK BY AND VERIFIED WITH: JASON ROBBINS PH AT 2040  11/11/15 MSS    Staphylococcus aureus NOT DETECTED NOT DETECTED Final   Methicillin resistance DETECTED (A) NOT DETECTED Final    Comment: CRITICAL RESULT CALLED TO, READ BACK BY AND VERIFIED WITH: JASON ROBBINS Cash AT 2040 11/11/15 MSS    Streptococcus species NOT DETECTED NOT DETECTED Final   Streptococcus agalactiae NOT DETECTED NOT DETECTED Final   Streptococcus pneumoniae NOT DETECTED NOT DETECTED Final   Streptococcus pyogenes NOT DETECTED NOT DETECTED Final   Acinetobacter baumannii NOT DETECTED NOT DETECTED Final   Enterobacteriaceae species NOT DETECTED NOT DETECTED Final   Enterobacter cloacae complex NOT DETECTED NOT DETECTED Final   Escherichia coli NOT DETECTED NOT DETECTED Final   Klebsiella oxytoca NOT DETECTED NOT DETECTED Final   Klebsiella pneumoniae NOT DETECTED NOT DETECTED Final   Proteus species NOT DETECTED NOT DETECTED Final   Serratia marcescens NOT DETECTED NOT DETECTED Final   Carbapenem resistance NOT DETECTED NOT DETECTED Final   Haemophilus influenzae NOT DETECTED NOT DETECTED Final   Neisseria meningitidis NOT DETECTED NOT DETECTED Final   Pseudomonas aeruginosa NOT DETECTED NOT DETECTED Final   Candida albicans NOT DETECTED NOT DETECTED Final   Candida glabrata NOT DETECTED NOT DETECTED Final   Candida krusei NOT DETECTED NOT DETECTED Final   Candida parapsilosis NOT DETECTED NOT DETECTED Final   Candida tropicalis NOT DETECTED NOT DETECTED Final  CULTURE, BLOOD (ROUTINE X 2) w Reflex to PCR ID Panel     Status: None (Preliminary result)   Collection Time: 11/10/15  9:08 PM  Result Value Ref Range Status   Specimen Description BLOOD LEFT HAND  Final   Special  Requests BOTTLES DRAWN AEROBIC AND ANAEROBIC 5ML  Final   Culture NO GROWTH 4 DAYS  Final   Report Status PENDING  Incomplete  Urine culture     Status: Abnormal   Collection Time: 11/10/15 11:14 PM  Result Value Ref Range Status   Specimen Description URINE, RANDOM  Final   Special Requests NONE  Final   Culture >=100,000 COLONIES/mL KLEBSIELLA PNEUMONIAE (A)  Final   Report Status 11/13/2015 FINAL  Final   Organism ID, Bacteria KLEBSIELLA PNEUMONIAE (A)  Final      Susceptibility   Klebsiella pneumoniae - MIC*    AMPICILLIN >=32 RESISTANT Resistant     CEFAZOLIN <=4 SENSITIVE Sensitive     CEFTRIAXONE <=1 SENSITIVE Sensitive     CIPROFLOXACIN <=0.25 SENSITIVE Sensitive     GENTAMICIN <=1 SENSITIVE Sensitive     IMIPENEM <=0.25 SENSITIVE Sensitive     NITROFURANTOIN 64 INTERMEDIATE Intermediate     TRIMETH/SULFA <=20 SENSITIVE Sensitive     AMPICILLIN/SULBACTAM 4 SENSITIVE Sensitive     PIP/TAZO <=4 SENSITIVE Sensitive     Extended ESBL NEGATIVE Sensitive     * >=100,000 COLONIES/mL KLEBSIELLA PNEUMONIAE  Blood Culture ID Panel (Reflexed)     Status: None   Collection Time: 11/11/15  7:26 PM  Result Value Ref Range Status   Enterococcus species NOT DETECTED NOT DETECTED Corrected   Vancomycin resistance NOT DETECTED NOT DETECTED Corrected   Listeria monocytogenes NOT DETECTED NOT DETECTED Corrected   Staphylococcus species NOT DETECTED NOT DETECTED Corrected   Staphylococcus aureus NOT DETECTED NOT DETECTED Corrected    Comment: CORRECTED ON 04/14 AT 1004: PREVIOUSLY REPORTED AS DETECTED CRITICAL RESULT CALLED TO, READ BACK BY AND VERIFIED WITH: [place] AT 2040 11/11/15 MSS.   Methicillin resistance  NOT DETECTED Corrected    DUPLICATE ORDER/ ORDERED IN ERROR SPOKE TO SCOTT CHRISTY 11/12/15  DV    Comment: CORRECTED ON 04/14 AT 1004: PREVIOUSLY REPORTED AS DETECTED CRITICAL RESULT CALLED TO, READ BACK BY AND VERIFIED WITH: Violeta Gelinas [facility] AT 2040 11/11/15 MSS.    Streptococcus species NOT DETECTED NOT DETECTED Corrected   Streptococcus agalactiae NOT DETECTED NOT DETECTED Corrected   Streptococcus pneumoniae NOT DETECTED NOT DETECTED Corrected   Streptococcus pyogenes NOT DETECTED NOT DETECTED Corrected   Acinetobacter baumannii NOT DETECTED NOT DETECTED Corrected   Enterobacteriaceae species NOT DETECTED NOT DETECTED Corrected   Enterobacter cloacae complex NOT DETECTED NOT DETECTED Corrected   Escherichia coli NOT DETECTED NOT DETECTED Corrected   Klebsiella oxytoca NOT DETECTED NOT DETECTED Corrected   Klebsiella pneumoniae NOT DETECTED NOT DETECTED Corrected   Proteus species NOT DETECTED NOT DETECTED Corrected   Serratia marcescens NOT DETECTED NOT DETECTED Corrected   Carbapenem resistance NOT DETECTED NOT DETECTED Corrected   Haemophilus influenzae NOT DETECTED NOT DETECTED Corrected   Neisseria meningitidis NOT DETECTED NOT DETECTED Corrected   Pseudomonas aeruginosa NOT DETECTED NOT DETECTED Corrected   Candida albicans NOT DETECTED NOT DETECTED Corrected   Candida glabrata NOT DETECTED NOT DETECTED Corrected   Candida krusei NOT DETECTED NOT DETECTED Corrected   Candida parapsilosis NOT DETECTED NOT DETECTED Corrected   Candida tropicalis NOT DETECTED NOT DETECTED Corrected    Coagulation Studies: No results for input(s): LABPROT, INR in the last 72 hours.  Urinalysis: No results for input(s): COLORURINE, LABSPEC, PHURINE, GLUCOSEU, HGBUR, BILIRUBINUR, KETONESUR, PROTEINUR, UROBILINOGEN, NITRITE, LEUKOCYTESUR in the last 72 hours.  Invalid input(s): APPERANCEUR    Imaging: Dg Chest Port 1 View  11/13/2015  CLINICAL DATA:  Respiratory failure. EXAM: PORTABLE CHEST 1 VIEW COMPARISON:  11/11/2015. FINDINGS: The endotracheal and nasogastric tubes have been removed. The left jugular catheter has been repositioned with its tip in the inferior aspect of the superior vena cava. Normal sized heart. Mildly prominent interstitial markings.  Diffuse osteopenia. IMPRESSION: No acute abnormality.  Mild chronic interstitial lung disease. Electronically Signed   By: Claudie Revering M.D.   On: 11/13/2015 07:29     Medications:   . dextrose 5 % and 0.45 % NaCl with KCl 20 mEq/L 50 mL/hr at 11/14/15 1121   . busPIRone  10 mg Oral TID  . cefTRIAXone (ROCEPHIN)  IV  1 g Intravenous Q24H  . chlorhexidine gluconate (SAGE KIT)  15 mL Mouth Rinse BID  . famotidine (PEPCID) IV  20 mg Intravenous Daily  . heparin  5,000 Units Subcutaneous 3 times per day  . lamoTRIgine  150 mg Oral BID  . levothyroxine  50 mcg Intravenous Daily  . ondansetron (ZOFRAN) IV  4 mg Intravenous 4 times per day  . sodium chloride flush  10-40 mL Intracatheter Q12H  . sodium chloride flush  3 mL Intravenous Q12H  . venlafaxine  50 mg Oral BID WC   ALPRAZolam, alteplase, alum & mag hydroxide-simeth, bisacodyl, heparin, ipratropium-albuterol, metoprolol, sodium chloride flush  Assessment/ Plan:  55 y.o. female with a PMHx of osteoarthritis, osteoporosis, rheumatoid arthritis, bipolar disorder, anxiety, fibromyalgia, status post gastric bypass, who was admitted to [facility] on 11/08/2015 for evaluation of altered mental status. Patient apparently took many of her outpatient prescriptions over the past 4-5 days. She may have possibly also overdosed on Provigil.  1. Acute renal failure Baseline Creatinine 0.8 (01/2015) 2. Metabolic acidosis  3. drug overdose, patient apparently took provigil, elevated osmolar gap of 32. Volatile acid screen positive for acetone.  4. Acute respiratory failure. Extubated 4/13  5.  Acute  hepatic failure 6.  MRSA Bacteremia 7. Hypophosphatemia 8. Hypokalemia  Plan:  S Cr improved slightly UOP has improved; > 2000 last 24 hrs Electrolytes and Volume status are acceptable No acute indication for Dialysis at present. Replace phos PRN Replace potassium prn Encourage oral intake Change iv fluids to D5-1/2 NS with KCl (which may be  discontinued when oral intake improves) May be able to discontinue dialysis cathter in 1-2 days if UOP remains good and S Cr trends lower.     LOS: 6 Lisle Skillman 4/16/20171:22 PM

## 2015-11-15 ENCOUNTER — Inpatient Hospital Stay: Payer: Medicare HMO | Attending: Hematology and Oncology

## 2015-11-15 ENCOUNTER — Inpatient Hospital Stay: Payer: Medicare HMO

## 2015-11-15 DIAGNOSIS — E039 Hypothyroidism, unspecified: Secondary | ICD-10-CM

## 2015-11-15 LAB — CULTURE, BLOOD (ROUTINE X 2): Culture: NO GROWTH

## 2015-11-15 LAB — BASIC METABOLIC PANEL
Anion gap: 6 (ref 5–15)
BUN: 55 mg/dL — ABNORMAL HIGH (ref 6–20)
CO2: 27 mmol/L (ref 22–32)
Calcium: 8.5 mg/dL — ABNORMAL LOW (ref 8.9–10.3)
Chloride: 108 mmol/L (ref 101–111)
Creatinine, Ser: 1.87 mg/dL — ABNORMAL HIGH (ref 0.44–1.00)
GFR calc Af Amer: 34 mL/min — ABNORMAL LOW (ref >60)
GFR calc non Af Amer: 29 mL/min — ABNORMAL LOW (ref >60)
Glucose, Bld: 123 mg/dL — ABNORMAL HIGH (ref 65–99)
Potassium: 3.4 mmol/L — ABNORMAL LOW (ref 3.5–5.1)
Sodium: 141 mmol/L (ref 135–145)

## 2015-11-15 LAB — GLUCOSE, CAPILLARY
Glucose-Capillary: 67 mg/dL (ref 65–99)
Glucose-Capillary: 68 mg/dL (ref 65–99)
Glucose-Capillary: 85 mg/dL (ref 65–99)
Glucose-Capillary: 90 mg/dL (ref 65–99)
Glucose-Capillary: 94 mg/dL (ref 65–99)

## 2015-11-15 MED ORDER — CEPHALEXIN 500 MG PO CAPS
500.0000 mg | ORAL_CAPSULE | Freq: Three times a day (TID) | ORAL | Status: AC
  Administered 2015-11-15 – 2015-11-18 (×9): 500 mg via ORAL
  Filled 2015-11-15 (×9): qty 1

## 2015-11-15 MED ORDER — OXYCODONE HCL 5 MG PO TABS
5.0000 mg | ORAL_TABLET | ORAL | Status: DC | PRN
  Administered 2015-11-15 – 2015-11-16 (×5): 5 mg via ORAL
  Filled 2015-11-15 (×5): qty 1

## 2015-11-15 MED ORDER — GUAIFENESIN ER 600 MG PO TB12
600.0000 mg | ORAL_TABLET | Freq: Two times a day (BID) | ORAL | Status: DC
  Administered 2015-11-15: 600 mg via ORAL
  Filled 2015-11-15: qty 1

## 2015-11-15 MED ORDER — MORPHINE SULFATE (PF) 2 MG/ML IV SOLN
2.0000 mg | INTRAVENOUS | Status: DC | PRN
  Administered 2015-11-15 (×2): 2 mg via INTRAVENOUS
  Filled 2015-11-15 (×2): qty 1

## 2015-11-15 MED ORDER — LEVOTHYROXINE SODIUM 100 MCG PO TABS
100.0000 ug | ORAL_TABLET | Freq: Every day | ORAL | Status: DC
  Administered 2015-11-16 – 2015-11-19 (×4): 100 ug via ORAL
  Filled 2015-11-15 (×4): qty 1

## 2015-11-15 MED ORDER — GUAIFENESIN ER 600 MG PO TB12: 600.0000 mg | ORAL_TABLET | Freq: Two times a day (BID) | ORAL | Status: DC | PRN

## 2015-11-15 MED ORDER — MORPHINE SULFATE (PF) 2 MG/ML IV SOLN: 2.0000 mg | INTRAVENOUS | Status: DC | PRN

## 2015-11-15 NOTE — Progress Notes (Signed)
Pt requested a straw earlier this morning.  I educated her on why she cant use a straw.  Husband came in later requesting a straw and I educated him on why the pt shouldn't use a straw.  I explained how the swallowing is working with the patient and why it is important to use the thickened liquid

## 2015-11-15 NOTE — Progress Notes (Signed)
OT Cancellation Note  Patient Details Name: CLAIRISSA TORNER MRN: [id number] DOB: 20-Feb-1961   Cancelled Treatment:    Reason Eval/Treat Not Completed: Medical issues which prohibited therapy Chart reviewed.  Holding pt treatment at this time due to temporary femoral dialysis catheter. Per [facility] policy, OT cannot perform OOB tasks while a temporary femoral catheter is placed. Iit will be best to wait until removal of temporary femoral cath to assess ADLs and need for OT. Will attempt at later date/time after removal of temporary femoral dialysis catheter.    Chrys Racer, OTR/L ascom [phone] 11/15/2015, 1:01 PM

## 2015-11-15 NOTE — Progress Notes (Signed)
Nutrition Follow-up  DOCUMENTATION CODES:   Morbid obesity  INTERVENTION:   -Cater to pt preferences. Clarified tolerance to milk products as pt does not tolerate milk, ice cream, puddings, but does tolerate cheese and yogurt.  -Pt does not tolerate Mighty Shakes or Magic Cup and per report from husband also does not tolerate Ensure Enlive (despite being a lactose free supplement). Will encourage po intake of foods she does tolerate as the only other supplement in house is [facility] which is not compliant to Nectar thick liquids. Will continue to follow with SLP.    NUTRITION DIAGNOSIS:   Inadequate oral intake related to acute illness as evidenced by NPO status; improved as pt now off vent, on diet order, SLP following  GOAL:   Patient will meet greater than or equal to 90% of their needs  MONITOR:   Diet advancement, Weight trends, Labs  REASON FOR ASSESSMENT:   Consult Enteral/tube feeding initiation and management  ASSESSMENT:   55 yo female admitted with acute metabolic encephalopathy with severe metabolic acidosis s/p drug overdose with multi organ failure requiring intubation and  initiation of HD  Nephrology following, UOP improved >2.3L in past 24 hours; last HD on 4/12.  Diet Order:  DIET - DYS 1 Room service appropriate?: Yes with Assist; Fluid consistency:: Nectar Thick   Current Nutrition: Pt ate 100% of applesauce this morning, bites of egg whites and oatmeal. Pt drank some thickened cranberry juice this am. Pt does not tolerate Milk, ice cream, pudding, Ensure Enlive, Mighty Shakes, Magic Cup per husband.    Gastrointestinal Profile: Last BM:  11/14/2015  Medications: D5 1/2NS with KCl at 66mL/hr, Zofran   Electrolyte/Renal Profile and Glucose Profile:   Recent Labs Lab 11/10/15 0617  11/11/15 0419  11/12/15 1957 11/13/15 0814 11/14/15 0505  NA 137  < > 141  < > 139 138 141  K 3.2*  < > 3.3*  < > 2.9* 3.1* 3.2*  CL 101  < > 103  < > 101 101  104  CO2 14*  < > 23  < > 18* 22 26  BUN 41*  < > 38*  < > 74* 69* 72*  CREATININE 2.70*  < > 2.55*  < > 3.80* 3.41* 3.07*  CALCIUM 6.8*  < > 7.1*  < > 8.3* 8.0* 8.0*  MG 2.2  --  2.0  --   --  2.0 1.8  PHOS 3.2  --   --   --   --  <1.0* 2.8  GLUCOSE 69  < > 110*  < > 119* 93 82  < > = values in this interval not displayed. Protein Profile:  Recent Labs Lab 11/11/15 0419 11/12/15 0527 11/13/15 0814  ALBUMIN 2.4* 2.5* 2.2*     Weight Trend since Admission: Filed Weights   11/10/15 0945 11/10/15 1345 11/11/15 0626  Weight: 285 lb 11.5 oz (129.6 kg) 285 lb 11.5 oz (129.6 kg) 278 lb (126.1 kg)     Skin:  Reviewed, no issues   BMI:  Body mass index is 44.89 kg/(m^2).  Estimated Nutritional Needs:   Kcal:  2000-2435 kcals   Protein:  90-118 g   Fluid:  >2 L  EDUCATION NEEDS:   Education needs no appropriate at this time  Dwyane Luo, RD, LDN Pager [phone] Weekend/On-Call Pager [phone]

## 2015-11-15 NOTE — Care Management Note (Signed)
Spoke with Dr. Juleen China this morning and he is expecting patient to discharge with no need for outpatient dialysis.  Iran Sizer  Dialysis Coordinator  [phone]

## 2015-11-15 NOTE — Progress Notes (Signed)
Overall much improved since I saw her last on 04/14 Remains lethargic No distress No new complaints  Filed Vitals:   11/15/15 0029 11/15/15 0338 11/15/15 0722 11/15/15 1102  BP: 129/75 126/65 134/68 123/63  Pulse: 78 75 80 80  Temp: 97.7 F (36.5 C) 97.6 F (36.4 C) 98.5 F (36.9 C) 98.2 F (36.8 C)  TempSrc: Oral Oral Oral Oral  Resp: 20 20 16 18   Height:      Weight:      SpO2: 99% 100% 100% 96%    RASS -1, + F/C No focal deficits HEENT WNL Chest clear anteriorly RRR s M NABS, soft Ext warm, no edema  BMP Latest Ref Rng 11/15/2015 11/14/2015 11/13/2015  Glucose 65 - 99 mg/dL 123(H) 82 93  BUN 6 - 20 mg/dL 55(H) 72(H) 69(H)  Creatinine 0.44 - 1.00 mg/dL 1.87(H) 3.07(H) 3.41(H)  Sodium 135 - 145 mmol/L 141 141 138  Potassium 3.5 - 5.1 mmol/L 3.4(L) 3.2(L) 3.1(L)  Chloride 101 - 111 mmol/L 108 104 101  CO2 22 - 32 mmol/L 27 26 22   Calcium 8.9 - 10.3 mg/dL 8.5(L) 8.0(L) 8.0(L)    CBC Latest Ref Rng 11/14/2015 11/13/2015 11/12/2015  WBC 3.6 - 11.0 K/uL 6.1 4.6 5.2  Hemoglobin 12.0 - 16.0 g/dL 9.7(L) 9.3(L) 9.4(L)  Hematocrit 35.0 - 47.0 % 27.6(L) 26.9(L) 27.4(L)  Platelets 150 - 440 K/uL 112(L) 118(L) 131(L)    No new CXR  IMPRESSION: Resolving critical illness MSSA PNA AKI - improving Elevated LFTs - improving as of 04/15 Thrombocytopenia Coagulopathy Hypothyroidism S/P modanafil (and possibly other substances) OD Bipolar disorder with substance misuse  PLAN/REC: DC CVL DC HD catheter Change ceftriaxone to cephalexin and complete 3 more days Monitor renal function Recheck CBC and LFTs in AM 04/18 Change levothyroxine to PO I have requested Psychiatry consultation  PCCM will sign off. Please call if we can be of further assistance  Merton Border, MD PCCM service Mobile [phone] Pager [phone] 11/15/2015

## 2015-11-15 NOTE — Progress Notes (Signed)
Speech Language Pathology Treatment: Dysphagia  Patient Details Name: Jamie Bennett MRN: [id number] DOB: 1961-06-25 Today's Date: 11/15/2015 Time: [id number] SLP Time Calculation (min) (ACUTE ONLY): 35 min  Assessment / Plan / Recommendation Clinical Impression  Pt and husband seen today for trials to upgrade diet as per poc, however, pt presented w/ increased drowsiness from recent pain meds - this situation could impede progress w/ upgrading pt's diet as well as increase risk for aspiration w/ any oral intake. Will f/u w/ MD to discuss further. Pt was repositioned in bed and given verbal/tactile cues and presented then w/ increased alertness. Pt appeared to tolerate trials of single ice chips and sips of of Nectar liquids via straw w/ no overt s/s of aspiration noted; no oral phase deficits noted. No other trials were given. Husband stated he had been giving her trials of "water" via cup and tsp w/ delayed coughing observed. Educated husband on s/s of dysphagia as well as pt's risk for dysphagia and aspiration at this time. Discussed poc including providing po trials of foods and liquids to upgrade pt's diet safely as it best meets her needs at that time in order to lessen risk for aspiration. Husband agreed. Dietician following for dietary supplement although pt is unable to tolerate Ensure from a GI perspective per Husband. Rec. Continue w/ current dysphagia diet w/ trials to assess safety of upgrade next 1-2 days. NSG updated. Will f/u w/ MD.    HPI HPI: Pt is a 55 y.o. female w/ h/o Gastric Bypass surgery 15+ yrs ago, Bipolar, Dis., Constipation, anxiety, RA, OA, Fibromyalgia, thyroid dis., reflux, and other medical issues who presents with altered mental status from Drug Overdose per MD. Patient was unable to contribute to her history, and collateral information is taken from her husband who is present at bedside. He states that she has bipolar, with depressive episodes. She has a prescription  for Provigil, and he feels that she likely can overdose of this medication. She has done this before. He states that he controls her medications and typically keeps that all locked up, but that she sometimes will still pick it up from the pharmacy. On evaluation in the ED she was responsive, but not in any coherent way. She wass also found to have acute kidney injury, as well as significantly elevated liver enzymes. Her blood pressure was also trending down. Pt was orally intubated for ~3 days, extubated yesterday. She is drowsy w/ mumbled speech requiring max cues to attend to task of taking po's. Husband stated she "coughed" on tsps of "water" he gave her earlier. Vocal quality c/b gravely hoarseness suspect d/t recent intubation. She has been tolerating the Honey liquids by tsp per NSG/husband.       SLP Plan  Continue with current plan of care     Recommendations  Diet recommendations: Dysphagia 1 (puree);Nectar-thick liquid (ice chips as tolerates) Liquids provided via: Cup;Straw Medication Administration: Crushed with puree Supervision: Staff to assist with self feeding;Full supervision/cueing for compensatory strategies Compensations: Minimize environmental distractions;Slow rate;Small sips/bites;Follow solids with liquid (check for clearing b/t trials; cues to alert) Postural Changes and/or Swallow Maneuvers: Seated upright 90 degrees;Upright 30-60 min after meal             General recommendations:  (Dietician following) Oral Care Recommendations: Oral care BID;Staff/trained caregiver to provide oral care Follow up Recommendations: Skilled Nursing facility Plan: Continue with current plan of care     GO  Jamie Kenner, MS, CCC-SLP  Jamie Bennett,Jamie Bennett 11/15/2015, 4:26 PM

## 2015-11-15 NOTE — Care Management Important Message (Signed)
Important Message  Patient Details  Name: Jamie Bennett MRN: [id number] Date of Birth: 14-Sep-1960   Medicare Important Message Given:  Yes    Juliann Pulse A Wanette Robison 11/15/2015, 10:27 AM

## 2015-11-15 NOTE — Progress Notes (Signed)
[facility] NAME: Jamie Bennett    MRN#:  [id number]  DATE OF BIRTH:  05-22-1961  SUBJECTIVE:  Hospital Day: 7 days Jamie Bennett is a 55 y.o. female presenting with Drug Overdose .   Overnight events: Transferred out of intensive care unit Interval Events: Still has generalized weakness, husband at bedside no focal complaints  REVIEW OF SYSTEMS:  CONSTITUTIONAL: No fever, positive fatigue or weakness.  EYES: No blurred or double vision.  EARS, NOSE, AND THROAT: No tinnitus or ear pain.  RESPIRATORY: No cough, shortness of breath, wheezing or hemoptysis.  CARDIOVASCULAR: No chest pain, orthopnea, edema.  GASTROINTESTINAL: No nausea, vomiting, diarrhea or abdominal pain.  GENITOURINARY: No dysuria, hematuria.  ENDOCRINE: No polyuria, nocturia,  HEMATOLOGY: No anemia, easy bruising or bleeding SKIN: No rash or lesion. MUSCULOSKELETAL: No joint pain or arthritis.   NEUROLOGIC: No tingling, numbness, weakness.  PSYCHIATRY: No anxiety or depression.   DRUG ALLERGIES:  No Known Allergies  VITALS:  Blood pressure 123/63, pulse 80, temperature 98.2 F (36.8 C), temperature source Oral, resp. rate 18, height 5\' 6"  (1.676 m), weight 126.1 kg (278 lb), SpO2 96 %.  PHYSICAL EXAMINATION:  VITAL SIGNS: Filed Vitals:   11/15/15 0722 11/15/15 1102  BP: 134/68 123/63  Pulse: 80 80  Temp: 98.5 F (36.9 C) 98.2 F (36.8 C)  Resp: 16 50   GENERAL:54 y.o.female currently in no acute distress.  HEAD: Normocephalic, atraumatic.  EYES: Pupils equal, round, reactive to light. Extraocular muscles intact. No scleral icterus.  MOUTH: Moist mucosal membrane. Dentition intact. No abscess noted.  EAR, NOSE, THROAT: Clear without exudates. No external lesions.  NECK: Supple. No thyromegaly. No nodules. No JVD.  PULMONARY: Clear to ascultation, without wheeze rails or rhonci. No use of accessory muscles, Good respiratory effort.  good air entry bilaterally CHEST: Nontender to palpation.  CARDIOVASCULAR: S1 and S2. Regular rate and rhythm. No murmurs, rubs, or gallops. No edema. Pedal pulses 2+ bilaterally.  GASTROINTESTINAL: Soft, nontender, nondistended. No masses. Positive bowel sounds. No hepatosplenomegaly.  MUSCULOSKELETAL: No swelling, clubbing, or edema. Range of motion full in all extremities.  NEUROLOGIC: Cranial nerves II through XII are intact. No gross focal neurological deficits. Sensation intact. Reflexes intact.  SKIN: No ulceration, lesions, rashes, or cyanosis. Skin warm and dry. Turgor intact.  PSYCHIATRIC: Mood, affect flat. The patient is awake, alert and oriented x 3. Insight, judgment intact. Speech pattern is very slow but appropriate     LABORATORY PANEL:   CBC  Recent Labs Lab 11/14/15 0505  WBC 6.1  HGB 9.7*  HCT 27.6*  PLT 112*   ------------------------------------------------------------------------------------------------------------------  Chemistries   Recent Labs Lab 11/13/15 0814 11/14/15 0505 11/15/15 0952  NA 138 141 141  K 3.1* 3.2* 3.4*  CL 101 104 108  CO2 22 26 27   GLUCOSE 93 82 123*  BUN 69* 72* 55*  CREATININE 3.41* 3.07* 1.87*  CALCIUM 8.0* 8.0* 8.5*  MG 2.0 1.8  --   AST 164*  --   --   ALT 328*  --   --   ALKPHOS 184*  --   --   BILITOT 1.1  --   --    ------------------------------------------------------------------------------------------------------------------  Cardiac Enzymes  Recent Labs Lab 11/09/15 0336  TROPONINI 0.09*   ------------------------------------------------------------------------------------------------------------------  RADIOLOGY:  No results found.  EKG:   Orders placed or performed during the hospital encounter of 11/08/15  . ED EKG  . EKG 12-Lead  .  EKG 12-Lead  . ED EKG  . EKG 12-Lead  . EKG 12-Lead    ASSESSMENT AND PLAN:   Jamie Bennett is a 55 y.o. female presenting with Drug Overdose .  Admitted 11/08/2015 : Day #: 7 days 1. Metabolic encephalopathy secondary to prevent potential overdose patient makes continued improvements each day avoid overly sedating agents will attempt certainly minimizing narcotics 2. Acute renal failure: Nephrology following rechecked BMP this morning to assess kidney function and no further dialysis can discontinue femoral catheter 3. MSSA pneumonia continue oxygen and breathing treatments ceftriaxone day #6/7 4. Klebsiella urinary tract infection: Ceftriaxone day #5/7 5. Venous thromboembolism prophylactic: Heparin  All the records are reviewed and case discussed with Care Management/Social Workerr. Management plans discussed with the patient, family and they are in agreement.  CODE STATUS: full TOTAL TIME TAKING CARE OF THIS PATIENT: 28 minutes.   POSSIBLE D/C IN 2-3DAYS, DEPENDING ON CLINICAL CONDITION.   Jamie Bennett,  Karenann Cai.D on 11/15/2015 at 1:57 PM  Between 7am to 6pm - Pager - [phone]  After 6pm: House Pager: - [facility]  Office  [phone]  CC: Primary care physician; Lavera Guise, MD

## 2015-11-15 NOTE — Progress Notes (Signed)
[facility]  ROUNDING NOTE   Subjective:  Last HD on Apr 12 Extubated on 4/13 Moved to rm 151. Husband at bedside.  Creatinine 1.87   Objective:  Vital signs in last 24 hours:  Temp:  [97.6 F (36.4 C)-98.5 F (36.9 C)] 98.2 F (36.8 C) (04/17 1102) Pulse Rate:  [68-82] 80 (04/17 1102) Resp:  [16-22] 18 (04/17 1102) BP: (107-134)/(62-75) 123/63 mmHg (04/17 1102) SpO2:  [94 %-100 %] 96 % (04/17 1102)  Weight change:  Filed Weights   11/10/15 0945 11/10/15 1345 11/11/15 0626  Weight: 129.6 kg (285 lb 11.5 oz) 129.6 kg (285 lb 11.5 oz) 126.1 kg (278 lb)    Intake/Output: I/O last 3 completed shifts: In: 2131.7 [P.O.:120; I.V.:2011.7] Out: 3070 [Urine:3070]   Intake/Output this shift:  Total I/O In: -  Out: 1300 [Urine:1300]  Physical Exam: General: NAD laying in bed  Head/ENT: Normocephalic, atraumatic.     Eyes: Anicteric  Neck: Supple, trachea midline  Lungs:  Normal effort, coarse breath sounds bilaterally    Heart: S1S2 no rubs  Abdomen:  Soft, nontender,non- distended  Extremities: trace peripheral edema.   Neurologic: Alert, able to follow commands  Skin: No acute lesions  Access: Right Femoral temporary dialysis catheter    Basic Metabolic Panel:  Recent Labs Lab 11/09/15 1005 11/09/15 1836  11/10/15 0617  11/11/15 0419 11/12/15 0527 11/12/15 1957 11/13/15 0814 11/14/15 0505 11/15/15 0952  NA 137 139  < > 137  < > 141 141 139 138 141 141  K 4.6 2.9*  < > 3.2*  < > 3.3* 4.3 2.9* 3.1* 3.2* 3.4*  CL 106 101  < > 101  < > 103 101 101 101 104 108  CO2 11* 18*  < > 14*  < > 23 15* 18* '22 26 27  ' GLUCOSE 120* 67  < > 69  < > 110* 56* 119* 93 82 123*  BUN 63* 28*  < > 41*  < > 38* 67* 74* 69* 72* 55*  CREATININE 3.10* 1.88*  < > 2.70*  < > 2.55* 3.47* 3.80* 3.41* 3.07* 1.87*  CALCIUM 6.6* 7.3*  < > 6.8*  < > 7.1* 8.0* 8.3* 8.0* 8.0* 8.5*  MG  --  1.9  --  2.2  --  2.0  --   --  2.0 1.8  --   PHOS 5.4* 1.9*  --  3.2  --   --   --   --   <1.0* 2.8  --   < > = values in this interval not displayed.  Liver Function Tests:  Recent Labs Lab 11/10/15 0617 11/10/15 1743 11/11/15 0419 11/12/15 0527 11/13/15 0814  AST 1493* 1210* 979* 475* 164*  ALT 821* 843* 824* 577* 328*  ALKPHOS 138* 167* 172* 191* 184*  BILITOT 2.1* 1.6* 1.0 2.1* 1.1  PROT 5.2* 5.4* 5.3* 5.6* 5.5*  ALBUMIN 2.6* 2.6* 2.4* 2.5* 2.2*   No results for input(s): LIPASE, AMYLASE in the last 168 hours.  Recent Labs Lab 11/09/15 0751  AMMONIA 161*    CBC:  Recent Labs Lab 11/10/15 0519 11/11/15 0419 11/12/15 0527 11/13/15 0814 11/14/15 0505  WBC 2.9* 2.5* 5.2 4.6 6.1  HGB 9.9* 9.9* 9.4* 9.3* 9.7*  HCT 29.9* 28.7* 27.4* 26.9* 27.6*  MCV 95.4 94.1 92.4 91.2 93.6  PLT 140* 114* 131* 118* 112*    Cardiac Enzymes:  Recent Labs Lab 11/09/15 0336  TROPONINI 0.09*    BNP: Invalid input(s): POCBNP  CBG:  Recent Labs Lab 11/14/15 2045 11/15/15 0027 11/15/15 0336 11/15/15 0724 11/15/15 1118  GLUCAP 83 68 85 67 94    Microbiology: Results for orders placed or performed during the hospital encounter of 11/08/15  MRSA PCR Screening     Status: None   Collection Time: 11/09/15  2:09 AM  Result Value Ref Range Status   MRSA by PCR NEGATIVE NEGATIVE Final    Comment:        The GeneXpert MRSA Assay (FDA approved for NASAL specimens only), is one component of a comprehensive MRSA colonization surveillance program. It is not intended to diagnose MRSA infection nor to guide or monitor treatment for MRSA infections.   Culture, expectorated sputum-assessment     Status: None   Collection Time: 11/10/15  8:30 AM  Result Value Ref Range Status   Specimen Description ENDOTRACHEAL  Final   Special Requests NONE  Final   Sputum evaluation THIS SPECIMEN IS ACCEPTABLE FOR SPUTUM CULTURE  Final   Report Status 11/10/2015 FINAL  Final  Culture, respiratory (NON-Expectorated)     Status: None   Collection Time: 11/10/15  8:30 AM   Result Value Ref Range Status   Specimen Description ENDOTRACHEAL  Final   Special Requests NONE Reflexed from [id number]  Final   Gram Stain   Final    FEW WBC SEEN MANY GRAM POSITIVE COCCI RARE YEAST    Culture HEAVY GROWTH STAPHYLOCOCCUS AUREUS  Final   Report Status 11/13/2015 FINAL  Final   Organism ID, Bacteria STAPHYLOCOCCUS AUREUS  Final      Susceptibility   Staphylococcus aureus - MIC*    CIPROFLOXACIN <=0.5 SENSITIVE Sensitive     ERYTHROMYCIN <=0.25 SENSITIVE Sensitive     GENTAMICIN <=0.5 SENSITIVE Sensitive     OXACILLIN <=0.25 SENSITIVE Sensitive     TETRACYCLINE <=1 SENSITIVE Sensitive     VANCOMYCIN <=0.5 SENSITIVE Sensitive     TRIMETH/SULFA <=10 SENSITIVE Sensitive     CLINDAMYCIN <=0.25 SENSITIVE Sensitive     RIFAMPIN <=0.5 SENSITIVE Sensitive     Inducible Clindamycin NEGATIVE Sensitive     * HEAVY GROWTH STAPHYLOCOCCUS AUREUS  CULTURE, BLOOD (ROUTINE X 2) w Reflex to PCR ID Panel     Status: None   Collection Time: 11/10/15  8:47 PM  Result Value Ref Range Status   Specimen Description BLOOD RIGHT HAND  Final   Special Requests BOTTLES DRAWN AEROBIC AND ANAEROBIC 5ML  Final   Culture  Setup Time   Final    GRAM POSITIVE COCCI ANAEROBIC BOTTLE ONLY CRITICAL RESULT CALLED TO, READ BACK BY AND VERIFIED WITH: Violeta Gelinas [place] AT 2040 11/11/15 MSS. Organism ID to follow    Culture   Final    COAGULASE NEGATIVE STAPHYLOCOCCUS ANAEROBIC BOTTLE ONLY THE SIGNIFICANCE OF ISOLATING THIS ORGANISM FROM A SINGLE VENIPUNCTURE CANNOT BE PREDICTED WITHOUT FURTHER CLINICAL AND CULTURE CORRELATION. SUSCEPTIBILITIES AVAILABLE ONLY ON REQUEST.    Report Status 11/15/2015 FINAL  Final  Blood Culture ID Panel (Reflexed)     Status: Abnormal   Collection Time: 11/10/15  8:47 PM  Result Value Ref Range Status   Enterococcus species NOT DETECTED NOT DETECTED Final   Vancomycin resistance NOT DETECTED NOT DETECTED Final   Listeria monocytogenes NOT DETECTED NOT DETECTED Final    Staphylococcus species DETECTED (A) NOT DETECTED Final    Comment: CRITICAL RESULT CALLED TO, READ BACK BY AND VERIFIED WITH: JASON ROBBINS [place] AT 2040 11/11/15 MSS    Staphylococcus aureus NOT DETECTED NOT DETECTED Final  Methicillin resistance DETECTED (A) NOT DETECTED Final    Comment: CRITICAL RESULT CALLED TO, READ BACK BY AND VERIFIED WITH: JASON ROBBINS [place] AT 2040 11/11/15 MSS    Streptococcus species NOT DETECTED NOT DETECTED Final   Streptococcus agalactiae NOT DETECTED NOT DETECTED Final   Streptococcus pneumoniae NOT DETECTED NOT DETECTED Final   Streptococcus pyogenes NOT DETECTED NOT DETECTED Final   Acinetobacter baumannii NOT DETECTED NOT DETECTED Final   Enterobacteriaceae species NOT DETECTED NOT DETECTED Final   Enterobacter cloacae complex NOT DETECTED NOT DETECTED Final   Escherichia coli NOT DETECTED NOT DETECTED Final   Klebsiella oxytoca NOT DETECTED NOT DETECTED Final   Klebsiella pneumoniae NOT DETECTED NOT DETECTED Final   Proteus species NOT DETECTED NOT DETECTED Final   Serratia marcescens NOT DETECTED NOT DETECTED Final   Carbapenem resistance NOT DETECTED NOT DETECTED Final   Haemophilus influenzae NOT DETECTED NOT DETECTED Final   Neisseria meningitidis NOT DETECTED NOT DETECTED Final   Pseudomonas aeruginosa NOT DETECTED NOT DETECTED Final   Candida albicans NOT DETECTED NOT DETECTED Final   Candida glabrata NOT DETECTED NOT DETECTED Final   Candida krusei NOT DETECTED NOT DETECTED Final   Candida parapsilosis NOT DETECTED NOT DETECTED Final   Candida tropicalis NOT DETECTED NOT DETECTED Final  CULTURE, BLOOD (ROUTINE X 2) w Reflex to PCR ID Panel     Status: None   Collection Time: 11/10/15  9:08 PM  Result Value Ref Range Status   Specimen Description BLOOD LEFT HAND  Final   Special Requests BOTTLES DRAWN AEROBIC AND ANAEROBIC 5ML  Final   Culture NO GROWTH 5 DAYS  Final   Report Status 11/15/2015 FINAL  Final  Urine culture     Status:  Abnormal   Collection Time: 11/10/15 11:14 PM  Result Value Ref Range Status   Specimen Description URINE, RANDOM  Final   Special Requests NONE  Final   Culture >=100,000 COLONIES/mL KLEBSIELLA PNEUMONIAE (A)  Final   Report Status 11/13/2015 FINAL  Final   Organism ID, Bacteria KLEBSIELLA PNEUMONIAE (A)  Final      Susceptibility   Klebsiella pneumoniae - MIC*    AMPICILLIN >=32 RESISTANT Resistant     CEFAZOLIN <=4 SENSITIVE Sensitive     CEFTRIAXONE <=1 SENSITIVE Sensitive     CIPROFLOXACIN <=0.25 SENSITIVE Sensitive     GENTAMICIN <=1 SENSITIVE Sensitive     IMIPENEM <=0.25 SENSITIVE Sensitive     NITROFURANTOIN 64 INTERMEDIATE Intermediate     TRIMETH/SULFA <=20 SENSITIVE Sensitive     AMPICILLIN/SULBACTAM 4 SENSITIVE Sensitive     PIP/TAZO <=4 SENSITIVE Sensitive     Extended ESBL NEGATIVE Sensitive     * >=100,000 COLONIES/mL KLEBSIELLA PNEUMONIAE  Blood Culture ID Panel (Reflexed)     Status: None   Collection Time: 11/11/15  7:26 PM  Result Value Ref Range Status   Enterococcus species NOT DETECTED NOT DETECTED Corrected   Vancomycin resistance NOT DETECTED NOT DETECTED Corrected   Listeria monocytogenes NOT DETECTED NOT DETECTED Corrected   Staphylococcus species NOT DETECTED NOT DETECTED Corrected   Staphylococcus aureus NOT DETECTED NOT DETECTED Corrected    Comment: CORRECTED ON 04/14 AT 1004: PREVIOUSLY REPORTED AS DETECTED CRITICAL RESULT CALLED TO, READ BACK BY AND VERIFIED WITH: JASON ROBBINS [place] AT 2040 11/11/15 MSS.   Methicillin resistance  NOT DETECTED Corrected    DUPLICATE ORDER/ ORDERED IN ERROR SPOKE TO SCOTT CHRISTY 11/12/15 DV    Comment: CORRECTED ON 04/14 AT 1004: PREVIOUSLY REPORTED AS DETECTED CRITICAL  RESULT CALLED TO, READ BACK BY AND VERIFIED WITH: Violeta Gelinas [place] AT 2040 11/11/15 MSS.   Streptococcus species NOT DETECTED NOT DETECTED Corrected   Streptococcus agalactiae NOT DETECTED NOT DETECTED Corrected   Streptococcus pneumoniae NOT DETECTED  NOT DETECTED Corrected   Streptococcus pyogenes NOT DETECTED NOT DETECTED Corrected   Acinetobacter baumannii NOT DETECTED NOT DETECTED Corrected   Enterobacteriaceae species NOT DETECTED NOT DETECTED Corrected   Enterobacter cloacae complex NOT DETECTED NOT DETECTED Corrected   Escherichia coli NOT DETECTED NOT DETECTED Corrected   Klebsiella oxytoca NOT DETECTED NOT DETECTED Corrected   Klebsiella pneumoniae NOT DETECTED NOT DETECTED Corrected   Proteus species NOT DETECTED NOT DETECTED Corrected   Serratia marcescens NOT DETECTED NOT DETECTED Corrected   Carbapenem resistance NOT DETECTED NOT DETECTED Corrected   Haemophilus influenzae NOT DETECTED NOT DETECTED Corrected   Neisseria meningitidis NOT DETECTED NOT DETECTED Corrected   Pseudomonas aeruginosa NOT DETECTED NOT DETECTED Corrected   Candida albicans NOT DETECTED NOT DETECTED Corrected   Candida glabrata NOT DETECTED NOT DETECTED Corrected   Candida krusei NOT DETECTED NOT DETECTED Corrected   Candida parapsilosis NOT DETECTED NOT DETECTED Corrected   Candida tropicalis NOT DETECTED NOT DETECTED Corrected    Coagulation Studies: No results for input(s): LABPROT, INR in the last 72 hours.  Urinalysis: No results for input(s): COLORURINE, LABSPEC, PHURINE, GLUCOSEU, HGBUR, BILIRUBINUR, KETONESUR, PROTEINUR, UROBILINOGEN, NITRITE, LEUKOCYTESUR in the last 72 hours.  Invalid input(s): APPERANCEUR    Imaging: No results found.   Medications:   . dextrose 5 % and 0.45 % NaCl with KCl 20 mEq/L 50 mL/hr at 11/15/15 0935   . busPIRone  10 mg Oral TID  . cefTRIAXone (ROCEPHIN)  IV  1 g Intravenous Q24H  . chlorhexidine gluconate (SAGE KIT)  15 mL Mouth Rinse BID  . guaiFENesin  600 mg Oral BID  . heparin  5,000 Units Subcutaneous 3 times per day  . lamoTRIgine  150 mg Oral BID  . levothyroxine  50 mcg Intravenous Daily  . ondansetron (ZOFRAN) IV  4 mg Intravenous 4 times per day  . sodium chloride flush  10-40 mL  Intracatheter Q12H  . sodium chloride flush  3 mL Intravenous Q12H  . venlafaxine  50 mg Oral BID WC   ALPRAZolam, alteplase, alum & mag hydroxide-simeth, bisacodyl, heparin, ipratropium-albuterol, metoprolol, morphine injection, oxyCODONE, pantoprazole (PROTONIX) IV, sodium chloride flush  Assessment/ Plan:  55 y.o. female with a PMHx of osteoarthritis, osteoporosis, rheumatoid arthritis, bipolar disorder, anxiety, fibromyalgia, status post gastric bypass, who was admitted to [facility] on 11/08/2015 for evaluation of altered mental status. Patient apparently took many of her outpatient prescriptions over the past 4-5 days. She may have possibly also overdosed on Provigil.  1. Acute renal failure Baseline Creatinine 0.8 (01/2015) 2. Metabolic acidosis  3. drug overdose, patient apparently took provigil, elevated osmolar gap of 32. Volatile acid screen positive for acetone.  4. Acute respiratory failure. Extubated 4/13  5.  Acute hepatic failure 6.  MRSA Bacteremia 7. Hypophosphatemia 8. Hypokalemia  Plan:  Serum creatinine improving. Nonoliguric urine output. No acute indication for dialysis.  - remove temp HD catheter.     LOS: [place], Jadaya [place] 4/17/201712:39 PM

## 2015-11-15 NOTE — Progress Notes (Signed)
Pt is in no distress. No Bipap is in the room. Pt doesn't need Bipap at this time.

## 2015-11-15 NOTE — Progress Notes (Signed)
PT Hold Note  Patient Details Name: Jamie Bennett MRN: [id number] DOB: 08/31/1960   Cancelled Treatment:    Reason Eval/Treat Not Completed: Patient not medically ready. Pt with improving cognition but still lethargic per RN. She has an order to discontinue temporary femoral dialysis catheter. Per RN this will happen today or tomorrow AM. Pt will need 2 hours bed rest following removal. Per [place] unable to perform bed mobility, transfers, or ambulation with patient with temporary femoral catheter. Unable to make discharge recommendations until pt can perform mobility with therapist. Will hold PT evaluation until medically appropriate given that she already has order to remove fem cath.   Lyndel Safe Huprich PT, DPT   Huprich,Jason 11/15/2015, 2:23 PM

## 2015-11-16 LAB — COMPREHENSIVE METABOLIC PANEL
ALT: 103 U/L — ABNORMAL HIGH (ref 14–54)
AST: 24 U/L (ref 15–41)
Albumin: 2 g/dL — ABNORMAL LOW (ref 3.5–5.0)
Alkaline Phosphatase: 151 U/L — ABNORMAL HIGH (ref 38–126)
Anion gap: 4 — ABNORMAL LOW (ref 5–15)
BUN: 38 mg/dL — ABNORMAL HIGH (ref 6–20)
CO2: 27 mmol/L (ref 22–32)
Calcium: 8.3 mg/dL — ABNORMAL LOW (ref 8.9–10.3)
Chloride: 110 mmol/L (ref 101–111)
Creatinine, Ser: 1.13 mg/dL — ABNORMAL HIGH (ref 0.44–1.00)
GFR calc Af Amer: >60 mL/min (ref >60)
GFR calc non Af Amer: 54 mL/min — ABNORMAL LOW (ref >60)
Glucose, Bld: 87 mg/dL (ref 65–99)
Potassium: 3.3 mmol/L — ABNORMAL LOW (ref 3.5–5.1)
Sodium: 141 mmol/L (ref 135–145)
Total Bilirubin: 0.6 mg/dL (ref 0.3–1.2)
Total Protein: 5.5 g/dL — ABNORMAL LOW (ref 6.5–8.1)

## 2015-11-16 LAB — CBC
HCT: 27.1 % — ABNORMAL LOW (ref 35.0–47.0)
Hemoglobin: 9.2 g/dL — ABNORMAL LOW (ref 12.0–16.0)
MCH: 32.5 pg (ref 26.0–34.0)
MCHC: 34.1 g/dL (ref 32.0–36.0)
MCV: 95.1 fL (ref 80.0–100.0)
Platelets: 103 10*3/uL — ABNORMAL LOW (ref 150–440)
RBC: 2.85 MIL/uL — ABNORMAL LOW (ref 3.80–5.20)
RDW: 16.7 % — ABNORMAL HIGH (ref 11.5–14.5)
WBC: 8.9 10*3/uL (ref 3.6–11.0)

## 2015-11-16 LAB — GLUCOSE, CAPILLARY: Glucose-Capillary: 78 mg/dL (ref 65–99)

## 2015-11-16 LAB — CALCIUM, IONIZED: Calcium, Ionized, Serum: 4.8 mg/dL (ref 4.5–5.6)

## 2015-11-16 MED ORDER — ONDANSETRON HCL 4 MG/2ML IJ SOLN
4.0000 mg | Freq: Four times a day (QID) | INTRAMUSCULAR | Status: DC | PRN
  Administered 2015-11-19: 4 mg via INTRAVENOUS
  Filled 2015-11-16: qty 2

## 2015-11-16 MED ORDER — OXYCODONE HCL 5 MG PO TABS
5.0000 mg | ORAL_TABLET | ORAL | Status: DC | PRN
  Administered 2015-11-16 – 2015-11-19 (×13): 5 mg via ORAL
  Filled 2015-11-16 (×12): qty 1

## 2015-11-16 MED ORDER — ENOXAPARIN SODIUM 40 MG/0.4ML ~~LOC~~ SOLN
40.0000 mg | SUBCUTANEOUS | Status: DC
  Administered 2015-11-16 – 2015-11-17 (×2): 40 mg via SUBCUTANEOUS
  Filled 2015-11-16 (×2): qty 0.4

## 2015-11-16 MED ORDER — PANTOPRAZOLE SODIUM 40 MG PO TBEC
40.0000 mg | DELAYED_RELEASE_TABLET | Freq: Every day | ORAL | Status: DC
  Administered 2015-11-16 – 2015-11-19 (×4): 40 mg via ORAL
  Filled 2015-11-16 (×3): qty 1

## 2015-11-16 NOTE — Clinical Social Work Note (Signed)
Clinical Social Work Assessment  Patient Details  Name: Jamie Bennett MRN: [id number] Date of Birth: 06-27-1961  Date of referral:  11/16/15               Reason for consult:  Facility Placement, Mental Health Concerns, Discharge Planning                Permission sought to share information with:  Case Manager, Facility Sport and exercise psychologist, Family Supports, Psychiatrist Permission granted to share information::  Yes, Verbal Permission Granted  Name::        Agency::  SNF referral  Relationship::  Husband at the bedside  Contact Information:     Housing/Transportation Living arrangements for the past 2 months:  Single Family Home Source of Information:  Patient, Medical Team, Spouse Patient Interpreter Needed:  None Criminal Activity/Legal Involvement Pertinent to Current Situation/Hospitalization:  No - Comment as needed Significant Relationships:  Spouse Lives with:  Spouse Do you feel safe going back to the place where you live?  No Need for family participation in patient care:  No (Coment)  Care giving concerns:  Patient reports prior to hospitalization she was independent of all ADLs and currently she is hindered by stomach pain from coughing. She is aware of recommendations for SNF, but unclear if patient is motivated to complete SNF at rehab facility.  Patient reports she cannot walk and she can lift her arm, but she just hurts. She is very lethargic feeling and struggles keeping eyes open during assessment.    Social Worker assessment / plan:  LCSW met with patient and husband. Husband very guarded with information, but open to assessment and recommendations for SNF. Patient agreeable to ST rehab to get stronger as she reports she does not know how long it will take her to walk again. Patient reports she lives with husband and has support, but unclear if it would be 24 hour. Discussed psychiatry involvement and awaiting assessment and recommendations.  It appears this  was not a suicide intention, but more of a recreational event of patient wanting to get high. Thus ?substance abuse issues.   Patient has an active outpatient mental health provider per report and taking medications.  Reports no therapy involvement.  Plan at this time: SNF unless psychiatry feels she would benefit from inpatient admission.  Employment status:  Disabled (Comment on whether or not currently receiving Disability) Insurance information:  Managed Medicare PT Recommendations:  [facility] / Referral to community resources:  [place], Inpatient Psychiatric Care (Comment Required) (pending psych evaluation)  Patient/Family's Response to care:  Agreeable  Patient/Family's Understanding of and Emotional Response to Diagnosis, Current Treatment, and Prognosis:  Patient very flat and inconsistent throughout assessment. Husband guarded thus obtaining information was limited. Unclear of reason for ?overdose at this time. Aware of patient's limitations and agreeable to SNF.  Emotional Assessment Appearance:  Appears older than stated age Attitude/Demeanor/Rapport:  Attention Seeking, Inconsistent Affect (typically observed):  Depressed, Restless Orientation:  Oriented to Self, Oriented to Place, Oriented to  Time, Oriented to Situation Alcohol / Substance use:  Other (misuse of medication to "get high" per report) Psych involvement (Current and /or in the community):  Yes (Comment)  Discharge Needs  Concerns to be addressed:  Discharge Planning Concerns, Care Coordination, Mental Health Concerns Readmission within the last 30 days:  No Current discharge risk:  None Barriers to Discharge:  Ship broker, Continued Medical Work up   Jamie Bennett 11/16/2015, 2:55 PM

## 2015-11-16 NOTE — Evaluation (Signed)
Occupational Therapy Evaluation Patient Details Name: Jamie Bennett MRN: [id number] DOB: 1961-04-27 Today's Date: 11/16/2015    History of Present Illness Jamie Bennett is a 55yo white female who comes to [facility] on 4/10 after worsening lethargy and weakness, inability to stand or walk. Pt has told husband that she has been sneaking additional Provigil and suspected to have overdosed, similar to prior episode.  PMH: RA, bipolar disorder, OP, fibromyalgia, and OA. Baseline level of function included totla indep in ADL, IADL, and community distance AMB s AD. Husband reports no history of falls, but says that pt seems to have depressed ability to recover from LOB at baseline. Pt extubated on 4/13 to 2L [place], and remains with temporary femoral dialysis catheter. Pt not on HD at baseline.    Clinical Impression   Pt. Is a 55 y.o. Female who was admitted to [facility] after suspected overdose on Provigil. Pt. presents with weakness, pain, and decreased activity tolerance which hinders her ability to complete complete basic ADL tasks efficiently. Pt. Could benefit from skilled OT services to work on improving strength and endurance for basic hand to face patterns.    Follow Up Recommendations  SNF    Equipment Recommendations  Tub/shower bench;3 in 1 bedside comode    Recommendations for Other Services       Precautions / Restrictions Precautions Precautions: Fall Restrictions Weight Bearing Restrictions: No      Mobility Bed Mobility    Balance Pt. Seen for bedside eval.                            ADL                                         General ADL Comments: Pt. fatigues with basic light ADL tasks including hand to face/mouth patterns for grooming and self-feeding tasks. Pt. is total assist with LE ADL tasks.     Vision     Perception     Praxis      Pertinent Vitals/Pain Pain Assessment: 0-10 Pain Score: 6  Pain Location: lower abdomen   Pain Descriptors / Indicators: Aching;Discomfort Pain Intervention(s): Limited activity within patient's tolerance     Hand Dominance     Extremity/Trunk Assessment Upper Extremity Assessment Upper Extremity Assessment: Generalized weakness RUE Deficits / Details: RUE limited shoulder flexion/abduction. 3+/5 elbow flexion, extension, wrist flexion/extension. LUE Deficits / Details: LUE limited shoulder flexion/abduction. 3+/5 elbow flexion/extension, wrist flexion/extension.       Communication Communication Communication: No difficulties   Cognition Arousal/Alertness: Awake/alert Behavior During Therapy: WFL for tasks assessed/performed Overall Cognitive Status: Within Functional Limits for tasks assessed                     General Comments       Exercises Exercises: Other exercises Other Exercises Other Exercises: Pt able to perform supine ther-ex on B LE including SLR, hip ab/ad, and ankle pumps. Pt required mod assist for SLR and hip ab/ad, and no assist for ankle pumps. All ther-ex performed x10 reps.    Shoulder Instructions      Home Living Family/patient expects to be discharged to:: Private residence Living Arrangements: Spouse/significant other Available Help at Discharge: Family Type of Home: House Home Access: Stairs to enter [employer] of Steps: 3 Entrance Stairs-Rails:  (Has a vertical handle  in garage.) Home Layout: Two level (Does not use second level.)     Bathroom Shower/Tub: Door;Walk-in shower (Built in Electronics engineer.) Shower/tub characteristics: Door Biochemist, clinical: Standard     Home Equipment: Environmental consultant - 2 wheels          Prior Functioning/Environment Level of Independence: Independent (Attended water aerobics)        Comments: Pt was independent with all ADLs. Husband available to assist when needed. Pt was fairly active, walked and participate in aquatic therapy for FM.     OT Diagnosis: Generalized weakness    OT Problem List: Decreased strength;Decreased range of motion;Decreased activity tolerance;Decreased knowledge of use of DME or AE;Impaired UE functional use;Pain;Decreased safety awareness   OT Treatment/Interventions: Self-care/ADL training;Therapeutic exercise;Energy conservation;Therapeutic activities;Patient/family education    OT Goals(Current goals can be found in the care plan section) Acute Rehab OT Goals Patient Stated Goal: to return to PLOF OT Goal Formulation: With patient/family Potential to Achieve Goals: Good  OT Frequency: Min 1X/week   Barriers to D/C:            Co-evaluation              End of Session    Activity Tolerance: Patient limited by lethargy Patient left: in bed;with call bell/phone within reach;with bed alarm set;with family/visitor present   Time: 1010-1035 OT Time Calculation (min): 25 min Charges:  OT General Charges $OT Visit: 1 Procedure OT Evaluation $OT Eval Low Complexity: 1 Procedure G-Codes:    Harrel Carina, MS, OTR/L Harrel Carina 11/16/2015, 12:06 PM

## 2015-11-16 NOTE — Progress Notes (Signed)
Speech Language Pathology Treatment: Dysphagia  Patient Details Name: Jamie Bennett MRN: [id number] DOB: [birth year] Today's Date: 11/16/2015 Time: [id number] SLP Time Calculation (min) (ACUTE ONLY): 55 min  Assessment / Plan / Recommendation Clinical Impression  Pt appears at min. risk for aspiration following general aspiration precautions w/ po's at this time. Pt and husband seen today for trials to upgrade diet as per poc; pt presented w/ only min. drowsiness from recent pain meds - this situation was discussed as increased drowsiness from any sedating pain medication could impede progress w/ upgrading pt's diet as well as increase risk for aspiration w/ any oral intake. MD/PSychiatry made aware this afternoon. Pt was repositioned in bed and given verbal/tactile cues and support then presented w/ trials of single sips of thin liquids via cup w/ no immediate, overt s/s of aspiration noted; clear vocal quality b/t trials and no decline in respiratory status. When pt attempted to take multiple swallows, 2-3 swallows at a time of the water, she exhibited delayed throat clearing. Education immediately given on aspiration precautions and strategy of single, small sips. Pt was then given trials of moistened mech soft food trials w/ no gross oral phase deficits noted; appropriate mastication and clearing noted. Educated husband and pt on strategies to reduce exertion during meals, need for rest breaks during meals to avoid fatigue and SOB. Education given on s/s of dysphagia as well as pt's risk for dysphagia and aspiration at this time. Discussed poc including upgrading pt's diet to a Dys. 2 w/ thin liquids then to next level of a Dys. 3 as tolerates. Husband is quite interested in pt returning to a regular diet. Dietician following for dietary supplement. NSG updated.     HPI HPI: Pt is a 55 y.o. female w/ h/o Gastric Bypass surgery 15+ yrs ago, Bipolar, Dis., Constipation, anxiety, RA, OA,  Fibromyalgia, thyroid dis., reflux, and other medical issues who presents with altered mental status from Drug Overdose per MD. Patient was unable to contribute to her history, and collateral information is taken from her husband who is present at bedside. He states that she has bipolar, with depressive episodes. She has a prescription for Provigil, and he feels that she likely can overdose of this medication. She has done this before. He states that he controls her medications and typically keeps that all locked up, but that she sometimes will still pick it up from the pharmacy. On evaluation in the ED she was responsive, but not in any coherent way. She wass also found to have acute kidney injury, as well as significantly elevated liver enzymes. Her blood pressure was also trending down. Pt was orally intubated for ~3 days, extubated. She is min. drowsy w/ low volume speech requiring min. cues to attend to tasks but more awake/alert than at other sessions. Husband did not acknowledge if he had been giving her water today. Vocal quality c/b min. gravely hoarseness suspect d/t recent intubation. She has been tolerating the Nectar liquids by cup/straw per NSG/husband.       SLP Plan  Continue with current plan of care     Recommendations  Diet recommendations: Dysphagia 2 (fine chop);Thin liquid Liquids provided via: No straw;Cup Medication Administration: Whole meds with puree Supervision: Staff to assist with self feeding;Full supervision/cueing for compensatory strategies Compensations: Minimize environmental distractions;Slow rate;Small sips/bites;Follow solids with liquid (rest breaks to avoid fatigue/SOB) Postural Changes and/or Swallow Maneuvers: Seated upright 90 degrees;Upright 30-60 min after meal (Reflux precautions sec. to gastric bypass )  General recommendations:  (Dietician f/u for supplements) Oral Care Recommendations: Oral care BID;Staff/trained caregiver to provide oral  care Follow up Recommendations: Skilled Nursing facility Plan: Continue with current plan of care     [place], [place], CCC-SLP  Bennett,Jamie 11/16/2015, 4:45 PM

## 2015-11-16 NOTE — Clinical Social Work Placement (Signed)
   CLINICAL SOCIAL WORK PLACEMENT  NOTE  Date:  11/16/2015  Patient Details  Name: Jamie Bennett MRN: [id number] Date of Birth: [birth year]  Clinical Social Work is seeking post-discharge placement for this patient at the [place] level of care (*CSW will initial, date and re-position this form in  chart as items are completed):  Yes   Patient/family provided with [place] Work Department's list of facilities offering this level of care within the geographic area requested by the patient (or if unable, by the patient's family).  Yes   Patient/family informed of their freedom to choose among providers that offer the needed level of care, that participate in Medicare, Medicaid or managed care program needed by the patient, have an available bed and are willing to accept the patient.  Yes   Patient/family informed of [facility]'s ownership interest in [facility] and [facility], as well as of the fact that they are under no obligation to receive care at these facilities.  PASRR submitted to EDS on 11/16/15     PASRR number received on       Existing PASRR number confirmed on       FL2 transmitted to all facilities in geographic area requested by pt/family on 11/16/15     FL2 transmitted to all facilities within larger geographic area on       Patient informed that his/her managed care company has contracts with or will negotiate with certain facilities, including the following:            Patient/family informed of bed offers received.  Patient chooses bed at       Physician recommends and patient chooses bed at      Patient to be transferred to   on  .  Patient to be transferred to facility by       Patient family notified on   of transfer.  Name of family member notified:        PHYSICIAN Please sign FL2     Additional Comment:   Patient is pending a [place]. Currently under a manual review and awaiting RN review.    _______________________________________________ Lilly [place], LCSW 11/16/2015, 3:16 PM

## 2015-11-16 NOTE — Progress Notes (Signed)
[facility]  ROUNDING NOTE   Subjective:  Working with PT this morning.    Objective:  Vital signs in last 24 hours:  Temp:  [98 F (36.7 C)-98.5 F (36.9 C)] 98.4 F (36.9 C) (04/18 0742) Pulse Rate:  [83-88] 88 (04/18 1134) Resp:  [16-19] 16 (04/18 0742) BP: (128-140)/(60-79) 140/67 mmHg (04/18 0742) SpO2:  [94 %-100 %] 94 % (04/18 1134)  Weight change:  Filed Weights   11/10/15 0945 11/10/15 1345 11/11/15 0626  Weight: 129.6 kg (285 lb 11.5 oz) 129.6 kg (285 lb 11.5 oz) 126.1 kg (278 lb)    Intake/Output: I/O last 3 completed shifts: In: 2440.8 [P.O.:360; I.V.:2080.8] Out: 5800 [Urine:5800]   Intake/Output this shift:  Total I/O In: 450 [Other:450] Out: -   Physical Exam: General: NAD laying in bed  Head/ENT: Normocephalic, atraumatic.     Eyes: Anicteric  Neck: Supple, trachea midline  Lungs:  Normal effort, coarse breath sounds bilaterally    Heart: S1S2 no rubs  Abdomen:  Soft, nontender,non- distended  Extremities: trace peripheral edema.   Neurologic: Alert, able to follow commands  Skin: No acute lesions  Access: removed    Basic Metabolic Panel:  Recent Labs Lab 11/09/15 1836  11/10/15 0617  11/11/15 0419  11/12/15 1957 11/13/15 0814 11/14/15 0505 11/15/15 0952 11/16/15 0726  NA 139  < > 137  < > 141  < > 139 138 141 141 141  K 2.9*  < > 3.2*  < > 3.3*  < > 2.9* 3.1* 3.2* 3.4* 3.3*  CL 101  < > 101  < > 103  < > 101 101 104 108 110  CO2 18*  < > 14*  < > 23  < > 18* 22 26 27 27   GLUCOSE 67  < > 69  < > 110*  < > 119* 93 82 123* 87  BUN 28*  < > 41*  < > 38*  < > 74* 69* 72* 55* 38*  CREATININE 1.88*  < > 2.70*  < > 2.55*  < > 3.80* 3.41* 3.07* 1.87* 1.13*  CALCIUM 7.3*  < > 6.8*  < > 7.1*  < > 8.3* 8.0* 8.0* 8.5* 8.3*  MG 1.9  --  2.2  --  2.0  --   --  2.0 1.8  --   --   PHOS 1.9*  --  3.2  --   --   --   --  <1.0* 2.8  --   --   < > = values in this interval not displayed.  Liver Function Tests:  Recent Labs Lab  11/10/15 1743 11/11/15 0419 11/12/15 0527 11/13/15 0814 11/16/15 0726  AST 1210* 979* 475* 164* 24  ALT 843* 824* 577* 328* 103*  ALKPHOS 167* 172* 191* 184* 151*  BILITOT 1.6* 1.0 2.1* 1.1 0.6  PROT 5.4* 5.3* 5.6* 5.5* 5.5*  ALBUMIN 2.6* 2.4* 2.5* 2.2* 2.0*   No results for input(s): LIPASE, AMYLASE in the last 168 hours. No results for input(s): AMMONIA in the last 168 hours.  CBC:  Recent Labs Lab 11/11/15 0419 11/12/15 0527 11/13/15 0814 11/14/15 0505 11/16/15 0726  WBC 2.5* 5.2 4.6 6.1 8.9  HGB 9.9* 9.4* 9.3* 9.7* 9.2*  HCT 28.7* 27.4* 26.9* 27.6* 27.1*  MCV 94.1 92.4 91.2 93.6 95.1  PLT 114* 131* 118* 112* 103*    Cardiac Enzymes: No results for input(s): CKTOTAL, CKMB, CKMBINDEX, TROPONINI in the last 168 hours.  BNP: Invalid input(s): POCBNP  CBG:  Recent Labs Lab 11/15/15 0336 11/15/15 0724 11/15/15 1118 11/15/15 1601 11/16/15 0743  GLUCAP 85 67 94 90 36    Microbiology: Results for orders placed or performed during the hospital encounter of 11/08/15  MRSA PCR Screening     Status: None   Collection Time: 11/09/15  2:09 AM  Result Value Ref Range Status   MRSA by PCR NEGATIVE NEGATIVE Final    Comment:        The GeneXpert MRSA Assay (FDA approved for NASAL specimens only), is one component of a comprehensive MRSA colonization surveillance program. It is not intended to diagnose MRSA infection nor to guide or monitor treatment for MRSA infections.   Culture, expectorated sputum-assessment     Status: None   Collection Time: 11/10/15  8:30 AM  Result Value Ref Range Status   Specimen Description ENDOTRACHEAL  Final   Special Requests NONE  Final   Sputum evaluation THIS SPECIMEN IS ACCEPTABLE FOR SPUTUM CULTURE  Final   Report Status 11/10/2015 FINAL  Final  Culture, respiratory (NON-Expectorated)     Status: None   Collection Time: 11/10/15  8:30 AM  Result Value Ref Range Status   Specimen Description ENDOTRACHEAL  Final    Special Requests NONE Reflexed from [id number]  Final   Gram Stain   Final    FEW WBC SEEN MANY GRAM POSITIVE COCCI RARE YEAST    Culture HEAVY GROWTH STAPHYLOCOCCUS AUREUS  Final   Report Status 11/13/2015 FINAL  Final   Organism ID, Bacteria STAPHYLOCOCCUS AUREUS  Final      Susceptibility   Staphylococcus aureus - MIC*    CIPROFLOXACIN <=0.5 SENSITIVE Sensitive     ERYTHROMYCIN <=0.25 SENSITIVE Sensitive     GENTAMICIN <=0.5 SENSITIVE Sensitive     OXACILLIN <=0.25 SENSITIVE Sensitive     TETRACYCLINE <=1 SENSITIVE Sensitive     VANCOMYCIN <=0.5 SENSITIVE Sensitive     TRIMETH/SULFA <=10 SENSITIVE Sensitive     CLINDAMYCIN <=0.25 SENSITIVE Sensitive     RIFAMPIN <=0.5 SENSITIVE Sensitive     Inducible Clindamycin NEGATIVE Sensitive     * HEAVY GROWTH STAPHYLOCOCCUS AUREUS  CULTURE, BLOOD (ROUTINE X 2) w Reflex to PCR ID Panel     Status: None   Collection Time: 11/10/15  8:47 PM  Result Value Ref Range Status   Specimen Description BLOOD RIGHT HAND  Final   Special Requests BOTTLES DRAWN AEROBIC AND ANAEROBIC 5ML  Final   Culture  Setup Time   Final    GRAM POSITIVE COCCI ANAEROBIC BOTTLE ONLY CRITICAL RESULT CALLED TO, READ BACK BY AND VERIFIED WITH: Violeta Gelinas [place] AT 2040 11/11/15 MSS. Organism ID to follow    Culture   Final    COAGULASE NEGATIVE STAPHYLOCOCCUS ANAEROBIC BOTTLE ONLY THE SIGNIFICANCE OF ISOLATING THIS ORGANISM FROM A SINGLE VENIPUNCTURE CANNOT BE PREDICTED WITHOUT FURTHER CLINICAL AND CULTURE CORRELATION. SUSCEPTIBILITIES AVAILABLE ONLY ON REQUEST.    Report Status 11/15/2015 FINAL  Final  Blood Culture ID Panel (Reflexed)     Status: Abnormal   Collection Time: 11/10/15  8:47 PM  Result Value Ref Range Status   Enterococcus species NOT DETECTED NOT DETECTED Final   Vancomycin resistance NOT DETECTED NOT DETECTED Final   Listeria monocytogenes NOT DETECTED NOT DETECTED Final   Staphylococcus species DETECTED (A) NOT DETECTED Final    Comment: CRITICAL  RESULT CALLED TO, READ BACK BY AND VERIFIED WITH: JASON ROBBINS [place] AT 2040 11/11/15 MSS    Staphylococcus aureus NOT DETECTED NOT DETECTED  Final   Methicillin resistance DETECTED (A) NOT DETECTED Final    Comment: CRITICAL RESULT CALLED TO, READ BACK BY AND VERIFIED WITH: JASON ROBBINS [place] AT 2040 11/11/15 MSS    Streptococcus species NOT DETECTED NOT DETECTED Final   Streptococcus agalactiae NOT DETECTED NOT DETECTED Final   Streptococcus pneumoniae NOT DETECTED NOT DETECTED Final   Streptococcus pyogenes NOT DETECTED NOT DETECTED Final   Acinetobacter baumannii NOT DETECTED NOT DETECTED Final   Enterobacteriaceae species NOT DETECTED NOT DETECTED Final   Enterobacter cloacae complex NOT DETECTED NOT DETECTED Final   Escherichia coli NOT DETECTED NOT DETECTED Final   Klebsiella oxytoca NOT DETECTED NOT DETECTED Final   Klebsiella pneumoniae NOT DETECTED NOT DETECTED Final   Proteus species NOT DETECTED NOT DETECTED Final   Serratia marcescens NOT DETECTED NOT DETECTED Final   Carbapenem resistance NOT DETECTED NOT DETECTED Final   Haemophilus influenzae NOT DETECTED NOT DETECTED Final   Neisseria meningitidis NOT DETECTED NOT DETECTED Final   Pseudomonas aeruginosa NOT DETECTED NOT DETECTED Final   Candida albicans NOT DETECTED NOT DETECTED Final   Candida glabrata NOT DETECTED NOT DETECTED Final   Candida krusei NOT DETECTED NOT DETECTED Final   Candida parapsilosis NOT DETECTED NOT DETECTED Final   Candida tropicalis NOT DETECTED NOT DETECTED Final  CULTURE, BLOOD (ROUTINE X 2) w Reflex to PCR ID Panel     Status: None   Collection Time: 11/10/15  9:08 PM  Result Value Ref Range Status   Specimen Description BLOOD LEFT HAND  Final   Special Requests BOTTLES DRAWN AEROBIC AND ANAEROBIC 5ML  Final   Culture NO GROWTH 5 DAYS  Final   Report Status 11/15/2015 FINAL  Final  Urine culture     Status: Abnormal   Collection Time: 11/10/15 11:14 PM  Result Value Ref Range Status    Specimen Description URINE, RANDOM  Final   Special Requests NONE  Final   Culture >=100,000 COLONIES/mL KLEBSIELLA PNEUMONIAE (A)  Final   Report Status 11/13/2015 FINAL  Final   Organism ID, Bacteria KLEBSIELLA PNEUMONIAE (A)  Final      Susceptibility   Klebsiella pneumoniae - MIC*    AMPICILLIN >=32 RESISTANT Resistant     CEFAZOLIN <=4 SENSITIVE Sensitive     CEFTRIAXONE <=1 SENSITIVE Sensitive     CIPROFLOXACIN <=0.25 SENSITIVE Sensitive     GENTAMICIN <=1 SENSITIVE Sensitive     IMIPENEM <=0.25 SENSITIVE Sensitive     NITROFURANTOIN 64 INTERMEDIATE Intermediate     TRIMETH/SULFA <=20 SENSITIVE Sensitive     AMPICILLIN/SULBACTAM 4 SENSITIVE Sensitive     PIP/TAZO <=4 SENSITIVE Sensitive     Extended ESBL NEGATIVE Sensitive     * >=100,000 COLONIES/mL KLEBSIELLA PNEUMONIAE  Blood Culture ID Panel (Reflexed)     Status: None   Collection Time: 11/11/15  7:26 PM  Result Value Ref Range Status   Enterococcus species NOT DETECTED NOT DETECTED Corrected   Vancomycin resistance NOT DETECTED NOT DETECTED Corrected   Listeria monocytogenes NOT DETECTED NOT DETECTED Corrected   Staphylococcus species NOT DETECTED NOT DETECTED Corrected   Staphylococcus aureus NOT DETECTED NOT DETECTED Corrected    Comment: CORRECTED ON 04/14 AT 1004: PREVIOUSLY REPORTED AS DETECTED CRITICAL RESULT CALLED TO, READ BACK BY AND VERIFIED WITH: JASON ROBBINS [place] AT 2040 11/11/15 MSS.   Methicillin resistance  NOT DETECTED Corrected    DUPLICATE ORDER/ ORDERED IN ERROR SPOKE TO SCOTT CHRISTY 11/12/15 DV    Comment: CORRECTED ON 04/14 AT 1004: PREVIOUSLY REPORTED  AS DETECTED CRITICAL RESULT CALLED TO, READ BACK BY AND VERIFIED WITH: Violeta Gelinas [facility] AT 2040 11/11/15 MSS.   Streptococcus species NOT DETECTED NOT DETECTED Corrected   Streptococcus agalactiae NOT DETECTED NOT DETECTED Corrected   Streptococcus pneumoniae NOT DETECTED NOT DETECTED Corrected   Streptococcus pyogenes NOT DETECTED NOT DETECTED  Corrected   Acinetobacter baumannii NOT DETECTED NOT DETECTED Corrected   Enterobacteriaceae species NOT DETECTED NOT DETECTED Corrected   Enterobacter cloacae complex NOT DETECTED NOT DETECTED Corrected   Escherichia coli NOT DETECTED NOT DETECTED Corrected   Klebsiella oxytoca NOT DETECTED NOT DETECTED Corrected   Klebsiella pneumoniae NOT DETECTED NOT DETECTED Corrected   Proteus species NOT DETECTED NOT DETECTED Corrected   Serratia marcescens NOT DETECTED NOT DETECTED Corrected   Carbapenem resistance NOT DETECTED NOT DETECTED Corrected   Haemophilus influenzae NOT DETECTED NOT DETECTED Corrected   Neisseria meningitidis NOT DETECTED NOT DETECTED Corrected   Pseudomonas aeruginosa NOT DETECTED NOT DETECTED Corrected   Candida albicans NOT DETECTED NOT DETECTED Corrected   Candida glabrata NOT DETECTED NOT DETECTED Corrected   Candida krusei NOT DETECTED NOT DETECTED Corrected   Candida parapsilosis NOT DETECTED NOT DETECTED Corrected   Candida tropicalis NOT DETECTED NOT DETECTED Corrected    Coagulation Studies: No results for input(s): LABPROT, INR in the last 72 hours.  Urinalysis: No results for input(s): COLORURINE, LABSPEC, PHURINE, GLUCOSEU, HGBUR, BILIRUBINUR, KETONESUR, PROTEINUR, UROBILINOGEN, NITRITE, LEUKOCYTESUR in the last 72 hours.  Invalid input(s): APPERANCEUR    Imaging: No results found.   Medications:   . dextrose 5 % and 0.45 % NaCl with KCl 20 mEq/L 50 mL/hr at 11/16/15 0420   . busPIRone  10 mg Oral TID  . cephALEXin  500 mg Oral 3 times per day  . heparin  5,000 Units Subcutaneous 3 times per day  . lamoTRIgine  150 mg Oral BID  . levothyroxine  100 mcg Oral QAC breakfast  . ondansetron (ZOFRAN) IV  4 mg Intravenous 4 times per day  . sodium chloride flush  10-40 mL Intracatheter Q12H  . sodium chloride flush  3 mL Intravenous Q12H  . venlafaxine  50 mg Oral BID WC   ALPRAZolam, alum & mag hydroxide-simeth, bisacodyl, guaiFENesin,  ipratropium-albuterol, metoprolol, morphine injection, oxyCODONE, sodium chloride flush  Assessment/ Plan:  56 y.o. female with a PMHx of osteoarthritis, osteoporosis, rheumatoid arthritis, bipolar disorder, anxiety, fibromyalgia, status post gastric bypass, who was admitted to [facility] on 11/08/2015 for evaluation of altered mental status. Patient apparently took many of her outpatient prescriptions over the past 4-5 days. She may have possibly also overdosed on Provigil.  1. Acute renal failure Baseline Creatinine 0.8 (01/2015) 2. Metabolic acidosis  3. drug overdose, patient apparently took provigil, elevated osmolar gap of 32. Volatile acid screen positive for acetone.  4. Acute respiratory failure. Extubated 4/13  5.  Acute hepatic failure 6.  MRSA Bacteremia 7. Hypophosphatemia 8. Hypokalemia  Plan:  Serum creatinine improving. Nonoliguric urine output. No acute indication for dialysis.  - removed temp HD catheter.  - will sign off. Please call with questions.     LOS: [place], Leverne Amrhein 4/18/201712:07 PM

## 2015-11-16 NOTE — NC FL2 (Signed)
[place] LEVEL OF CARE SCREENING TOOL     IDENTIFICATION  Patient Name: Jamie Bennett Birthdate: 01/11/1961 Sex: female Admission Date (Current Location): 11/08/2015  [place] and [place] Number:  Engineering geologist and Address:  [facility], [address], [place], [place] [address]      Provider Number: [id number]  Attending Physician Name and Address:  [name], MD  Relative Name and Phone Number:       Current Level of Care: Hospital Recommended Level of Care: [place] Prior Approval Number:    Date Approved/Denied:   PASRR Number: pending,   SS # [id number]  Discharge Plan: SNF    Current Diagnoses: Patient Active Problem List   Diagnosis Date Noted  . AKI (acute kidney injury) ([place]) 11/09/2015  . Elevated troponin 11/09/2015  . Hypotension 11/09/2015  . Respiratory failure ([place])   . Acute hepatic failure 11/08/2015  . Drug overdose 11/08/2015  . Fatty infiltration of liver 02/16/2015  . Hepatic fibrosis ([place]) 02/16/2015  . Abnormal serum level of alkaline phosphatase 02/15/2015  . Anemia 02/05/2015  . Vitamin B 12 deficiency 02/05/2015  . OP (osteoporosis) 06/16/2014  . Rheumatoid arteritis 03/02/2014  . Osteoarthritis of left hip 03/02/2014  . Hypothyroidism 03/02/2014  . Rheumatic fever without heart involvement 03/02/2014  . Adult hypothyroidism 03/02/2014  . Arthritis of pelvic region, degenerative 03/02/2014  . Bipolar 1 disorder, depressed ([place]) 02/26/2014  . Bariatric surgery status 11/24/2013  . Affective bipolar disorder ([place]) 11/24/2013  . Bipolar affective disorder ([place]) 11/24/2013  . Rheumatoid arthritis ([place]) 11/24/2013  . Polysubstance (excluding opioids) dependence ([place]) 09/11/2013  . Polysubstance dependence ([place]) 09/11/2013  . Combined drug dependence excluding opioids ([place]) 09/11/2013  . Arthritis or polyarthritis, rheumatoid ([place]) 09/05/2013  . Leg  weakness 09/05/2013    Orientation RESPIRATION BLADDER Height & Weight     Self, Time, Situation, Place  Normal Continent Weight: 278 lb (126.1 kg) Height:  5\' 6"  (167.6 cm)  BEHAVIORAL SYMPTOMS/MOOD NEUROLOGICAL BOWEL NUTRITION STATUS  Other (Comment) (none)   Continent Diet (regular)  AMBULATORY STATUS COMMUNICATION OF NEEDS Skin   Extensive Assist Verbally Normal                       Personal Care Assistance Level of Assistance  Bathing, Feeding, Dressing Bathing Assistance: Limited assistance Feeding assistance: Limited assistance Dressing Assistance: Limited assistance     Functional Limitations Info  Sight, Hearing, Speech Sight Info: Adequate Hearing Info: Adequate Speech Info: Adequate    SPECIAL CARE FACTORS FREQUENCY  PT (By licensed PT), OT (By licensed OT)     PT Frequency: 5 OT Frequency: 5            Contractures Contractures Info: Not present    Additional Factors Info  Code Status, Allergies, Psychotropic, Insulin Sliding Scale, Isolation Precautions Code Status Info: Full Allergies Info: NKA Psychotropic Info: Lamictal, Effexor, Xanax (prn), trazadone, Buspar, Provigil Insulin Sliding Scale Info: none Isolation Precautions Info: none     Current Medications (11/16/2015):  This is the current hospital active medication list Current Facility-Administered Medications  Medication Dose Route Frequency Provider Last Rate Last Dose  . alum & mag hydroxide-simeth (MAALOX/MYLANTA) 200-200-20 MG/5ML suspension 15 mL  15 mL Oral Q4H PRN Mikael Spray, NP      . bisacodyl (DULCOLAX) suppository 10 mg  10 mg Rectal Daily PRN Wilhelmina Mcardle, MD   10 mg at 11/12/15 0953  . busPIRone (  BUSPAR) tablet 10 mg  10 mg Oral TID Laverle Hobby, MD   10 mg at 11/16/15 0818  . cephALEXin (KEFLEX) capsule 500 mg  500 mg Oral 3 times per day Wilhelmina Mcardle, MD   500 mg at 11/16/15 0510  . dextrose 5 % and 0.45 % NaCl with KCl 20 mEq/L infusion    Intravenous Continuous Murlean Iba, MD 50 mL/hr at 11/16/15 0420    . enoxaparin (LOVENOX) injection 40 mg  40 mg Subcutaneous Q24H [name], MD      . guaiFENesin (MUCINEX) 12 hr tablet 600 mg  600 mg Oral BID PRN Wilhelmina Mcardle, MD      . ipratropium-albuterol (DUONEB) 0.5-2.5 (3) MG/3ML nebulizer solution 3 mL  3 mL Nebulization Q4H PRN Laverle Hobby, MD   3 mL at 11/13/15 1311  . lamoTRIgine (LAMICTAL) tablet 150 mg  150 mg Oral BID Laverle Hobby, MD   150 mg at 11/16/15 0818  . levothyroxine (SYNTHROID, LEVOTHROID) tablet 100 mcg  100 mcg Oral QAC breakfast Wilhelmina Mcardle, MD   100 mcg at 11/16/15 0818  . metoprolol (LOPRESSOR) injection 2.5-5 mg  2.5-5 mg Intravenous Q3H PRN Wilhelmina Mcardle, MD      . ondansetron [facility]) injection 4 mg  4 mg Intravenous Q6H PRN [name], MD      . oxyCODONE (Oxy IR/ROXICODONE) immediate release tablet 5 mg  5 mg Oral Q4H PRN [name], MD      . sodium chloride flush (NS) 0.9 % injection 10-40 mL  10-40 mL Intracatheter Q12H Flora Lipps, MD   10 mL at 11/15/15 0929  . sodium chloride flush (NS) 0.9 % injection 10-40 mL  10-40 mL Intracatheter PRN Flora Lipps, MD      . sodium chloride flush (NS) 0.9 % injection 3 mL  3 mL Intravenous Q12H Lance Coon, MD   3 mL at 11/16/15 0819  . venlafaxine (EFFEXOR) tablet 50 mg  50 mg Oral BID WC Flora Lipps, MD   50 mg at 11/16/15 0818     Discharge Medications: Please see discharge summary for a list of discharge medications.  Relevant Imaging Results:  Relevant Lab Results:   Additional Information    [place], LCSW

## 2015-11-16 NOTE — Consult Note (Signed)
[facility] Face-to-Face Psychiatry Consult   Reason for Consult:  Consult for this 55 year old woman with a history of mood instability is recovering from an overdose of unknown substances Referring Physician:  Anselm Jungling Patient Identification: Jamie Bennett MRN:  [id number] Principal Diagnosis: Bipolar 1 disorder, depressed ([place]) Diagnosis:   Patient Active Problem List   Diagnosis Date Noted  . AKI (acute kidney injury) ([place]) [N17.9] 11/09/2015  . Elevated troponin [R79.89] 11/09/2015  . Hypotension [I95.9] 11/09/2015  . Respiratory failure ([place]) [J96.90]   . Acute hepatic failure [K72.00] 11/08/2015  . Drug overdose [T50.901A] 11/08/2015  . Fatty infiltration of liver [K76.0] 02/16/2015  . Hepatic fibrosis ([place]) [K74.0] 02/16/2015  . Abnormal serum level of alkaline phosphatase [R74.8] 02/15/2015  . Anemia [D64.9] 02/05/2015  . Vitamin B 12 deficiency [E53.8] 02/05/2015  . OP (osteoporosis) [M81.0] 06/16/2014  . Rheumatoid arteritis [I00] 03/02/2014  . Osteoarthritis of left hip [M16.12] 03/02/2014  . Hypothyroidism [E03.9] 03/02/2014  . Rheumatic fever without heart involvement [I00] 03/02/2014  . Adult hypothyroidism [E03.9] 03/02/2014  . Arthritis of pelvic region, degenerative [M16.9] 03/02/2014  . Bipolar 1 disorder, depressed ([place]) [F31.9] 02/26/2014  . Bariatric surgery status [Z98.84] 11/24/2013  . Affective bipolar disorder (Jamie Bennett) [F31.9] 11/24/2013  . Bipolar affective disorder (Jamie Bennett) [F31.9] 11/24/2013  . Rheumatoid arthritis ([place]) [M06.9] 11/24/2013  . Polysubstance (excluding opioids) dependence ([place]) [F19.20] 09/11/2013  . Polysubstance dependence ([place]) [F19.20] 09/11/2013  . Combined drug dependence excluding opioids ([place]) [F19.20] 09/11/2013  . Arthritis or polyarthritis, rheumatoid ([place]) [M06.9] 09/05/2013  . Leg weakness [R29.898] 09/05/2013    Total Time spent with patient: 1 hour  Subjective:   Jamie Bennett is a 55 y.o. female patient admitted with  "I'm feeling better now.".  HPI:  Patient interviewed. Husband was present as well. Chart reviewed including notes throughout this hospitalization as well as prior psychiatric history. Labs and vitals reviewed. 56 year old woman presented to the hospital a little over a week ago with acute mental status changes, confusion. Initial unresponsiveness low blood pressure. Vital signs were low and she was having renal and hepatic shutdown. He has been in the critical care unit for several days and is now finally out on the floor. Patient was receiving dialysis briefly but that seems to resolve. Labs are all improving. From the beginning. The husband has insisted that this is the result of an overdose of Provigil. On interview today, the patient seems unclear about it and apparently is just answering questions based on what her husband has told her. She says that she remembers having been depressed. She seems a little vague in her supposedly memories of taking excessive amounts of Provigil. Tends to answer questions by saying "it must of been quite a bit". She denies that she had been abusing any of her other drugs which the husband also insists on. Reports that she had been depressed, but they both seemed insistent that she couldn't have been trying to kill herself. Patient denies any thoughts about wanting to hurt or kill herself now. Says that her mood is feeling much better. Not feeling acutely depressed now. Denies having any current hallucinations. Says she had some transiently while she was in the critical care unit, but that has all resolved. She had not been drinking or using any other substances. She gets her outpatient psychiatric care through doctors in the [facility] clinic. She had been seeing Dr. Jimmye Norman, until he left, but has not seen a psychiatrist since January. He is scheduled to see the  replacement. Dr. sometime soon. Doesn't report any particular recent stressor.  Social history: Patient  lives with her husband. Husband appears to be very supportive of her. She is not working outside the home.  Medical history: Extensive medical history including history of rheumatoid arthritis, hypothyroidism, history of chronic anemia, probably iron deficiency, history of arthritis. Surgery.  Substance abuse history: Looking back over her old psychiatry notes it's clear that she has a history of pretty significant substance abuse. This was really the major issue that off and let her to psychiatric attention in past years and as recently as a little over a year ago. Has a history of drinking abusively, as well as abusing prescriptions benzodiazepines.  Past Psychiatric History: Patient has a history of mood instability. Had been variously diagnosed as depressed, bipolar, and having borderline personality disorder. As near as I can tell, she really doesn't have a history of mania, especially not psychotic mania. As noted previously. A lot of her emergency presentations of also involve substance abuse. She does have a past history of suicide attempts and agitated behavior. Has had more than one hospitalization in the past, probably not a little over a year.  Risk to Self: Is patient at risk for suicide?: No Risk to Others:   Prior Inpatient Therapy:   Prior Outpatient Therapy:    Past Medical History:  Past Medical History  Diagnosis Date  . Osteoarthritis   . Osteoporosis   . Rheumatoid arthritis (Jamie Bennett)   . Thyroid disease   . Hypotension   . Constipation   . Anemia   . Bipolar 1 disorder (Jamie Bennett)   . Anxiety   . Fibromyalgia     Past Surgical History  Procedure Laterality Date  . Gastric bypass    . Cholecystectomy     Family History:  Family History  Problem Relation Age of Onset  . Depression Mother   . Dementia Mother   . Heart disease Mother   . Parkinson's disease Father    Family Psychiatric  History: Patient is not aware of any significant family history of mental  illness Social History:  History  Alcohol Use No     History  Drug Use No    Social History   Social History  . Marital Status: Married    Spouse Name: N/A  . Number of Children: N/A  . Years of Education: N/A   Social History Main Topics  . Smoking status: Never Smoker   . Smokeless tobacco: None  . Alcohol Use: No  . Drug Use: No  . Sexual Activity: Yes   Other Topics Concern  . None   Social History Narrative   Additional Social History:    Allergies:  No Known Allergies  Labs:  Results for orders placed or performed during the hospital encounter of 11/08/15 (from the past 48 hour(s))  Glucose, capillary     Status: None   Collection Time: 11/14/15  7:00 PM  Result Value Ref Range   Glucose-Capillary 80 65 - 99 mg/dL  Glucose, capillary     Status: None   Collection Time: 11/14/15  8:45 PM  Result Value Ref Range   Glucose-Capillary 83 65 - 99 mg/dL   Comment 1 Notify RN   Glucose, capillary     Status: None   Collection Time: 11/15/15 12:27 AM  Result Value Ref Range   Glucose-Capillary 68 65 - 99 mg/dL   Comment 1 Notify RN   Glucose, capillary     Status: None  Collection Time: 11/15/15  3:36 AM  Result Value Ref Range   Glucose-Capillary 85 65 - 99 mg/dL   Comment 1 Notify RN   Glucose, capillary     Status: None   Collection Time: 11/15/15  7:24 AM  Result Value Ref Range   Glucose-Capillary 67 65 - 99 mg/dL   Comment 1 Notify RN   Basic metabolic panel     Status: Abnormal   Collection Time: 11/15/15  9:52 AM  Result Value Ref Range   Sodium 141 135 - 145 mmol/L   Potassium 3.4 (L) 3.5 - 5.1 mmol/L   Chloride 108 101 - 111 mmol/L   CO2 27 22 - 32 mmol/L   Glucose, Bld 123 (H) 65 - 99 mg/dL   BUN 55 (H) 6 - 20 mg/dL   Creatinine, Ser 1.87 (H) 0.44 - 1.00 mg/dL   Calcium 8.5 (L) 8.9 - 10.3 mg/dL   GFR calc non Af Amer 29 (L) >60 mL/min   GFR calc Af Amer 34 (L) >60 mL/min    Comment: (NOTE) The eGFR has been calculated using the CKD EPI  equation. This calculation has not been validated in all clinical situations. eGFR's persistently <60 mL/min signify possible Chronic Kidney Disease.    Anion gap 6 5 - 15  Glucose, capillary     Status: None   Collection Time: 11/15/15 11:18 AM  Result Value Ref Range   Glucose-Capillary 94 65 - 99 mg/dL   Comment 1 Notify RN   Glucose, capillary     Status: None   Collection Time: 11/15/15  4:01 PM  Result Value Ref Range   Glucose-Capillary 90 65 - 99 mg/dL  Comprehensive metabolic panel     Status: Abnormal   Collection Time: 11/16/15  7:26 AM  Result Value Ref Range   Sodium 141 135 - 145 mmol/L   Potassium 3.3 (L) 3.5 - 5.1 mmol/L   Chloride 110 101 - 111 mmol/L   CO2 27 22 - 32 mmol/L   Glucose, Bld 87 65 - 99 mg/dL   BUN 38 (H) 6 - 20 mg/dL   Creatinine, Ser 1.13 (H) 0.44 - 1.00 mg/dL   Calcium 8.3 (L) 8.9 - 10.3 mg/dL   Total Protein 5.5 (L) 6.5 - 8.1 g/dL   Albumin 2.0 (L) 3.5 - 5.0 g/dL   AST 24 15 - 41 U/L   ALT 103 (H) 14 - 54 U/L   Alkaline Phosphatase 151 (H) 38 - 126 U/L   Total Bilirubin 0.6 0.3 - 1.2 mg/dL   GFR calc non Af Amer 54 (L) >60 mL/min   GFR calc Af Amer >60 >60 mL/min    Comment: (NOTE) The eGFR has been calculated using the CKD EPI equation. This calculation has not been validated in all clinical situations. eGFR's persistently <60 mL/min signify possible Chronic Kidney Disease.    Anion gap 4 (L) 5 - 15  CBC     Status: Abnormal   Collection Time: 11/16/15  7:26 AM  Result Value Ref Range   WBC 8.9 3.6 - 11.0 K/uL   RBC 2.85 (L) 3.80 - 5.20 MIL/uL   Hemoglobin 9.2 (L) 12.0 - 16.0 g/dL   HCT 27.1 (L) 35.0 - 47.0 %   MCV 95.1 80.0 - 100.0 fL   MCH 32.5 26.0 - 34.0 pg   MCHC 34.1 32.0 - 36.0 g/dL   RDW 16.7 (H) 11.5 - 14.5 %   Platelets 103 (L) 150 - 440 K/uL  Glucose,  capillary     Status: None   Collection Time: 11/16/15  7:43 AM  Result Value Ref Range   Glucose-Capillary 78 65 - 99 mg/dL   Comment 1 Notify RN     Current  Facility-Administered Medications  Medication Dose Route Frequency Provider Last Rate Last Dose  . alum & mag hydroxide-simeth (MAALOX/MYLANTA) 200-200-20 MG/5ML suspension 15 mL  15 mL Oral Q4H PRN Mikael Spray, NP      . bisacodyl (DULCOLAX) suppository 10 mg  10 mg Rectal Daily PRN Wilhelmina Mcardle, MD   10 mg at 11/12/15 0953  . busPIRone (BUSPAR) tablet 10 mg  10 mg Oral TID Laverle Hobby, MD   10 mg at 11/16/15 0818  . cephALEXin (KEFLEX) capsule 500 mg  500 mg Oral 3 times per day Wilhelmina Mcardle, MD   500 mg at 11/16/15 0510  . dextrose 5 % and 0.45 % NaCl with KCl 20 mEq/L infusion   Intravenous Continuous Murlean Iba, MD 50 mL/hr at 11/16/15 0420    . enoxaparin (LOVENOX) injection 40 mg  40 mg Subcutaneous Q24H [name], MD      . guaiFENesin (MUCINEX) 12 hr tablet 600 mg  600 mg Oral BID PRN Wilhelmina Mcardle, MD      . ipratropium-albuterol (DUONEB) 0.5-2.5 (3) MG/3ML nebulizer solution 3 mL  3 mL Nebulization Q4H PRN Laverle Hobby, MD   3 mL at 11/13/15 1311  . lamoTRIgine (LAMICTAL) tablet 150 mg  150 mg Oral BID Laverle Hobby, MD   150 mg at 11/16/15 0818  . levothyroxine (SYNTHROID, LEVOTHROID) tablet 100 mcg  100 mcg Oral QAC breakfast Wilhelmina Mcardle, MD   100 mcg at 11/16/15 0818  . metoprolol (LOPRESSOR) injection 2.5-5 mg  2.5-5 mg Intravenous Q3H PRN Wilhelmina Mcardle, MD      . ondansetron [facility]) injection 4 mg  4 mg Intravenous Q6H PRN [name], MD      . oxyCODONE (Oxy IR/ROXICODONE) immediate release tablet 5 mg  5 mg Oral Q4H PRN [name], MD      . sodium chloride flush (NS) 0.9 % injection 10-40 mL  10-40 mL Intracatheter Q12H Flora Lipps, MD   10 mL at 11/15/15 0929  . sodium chloride flush (NS) 0.9 % injection 10-40 mL  10-40 mL Intracatheter PRN Flora Lipps, MD      . sodium chloride flush (NS) 0.9 % injection 3 mL  3 mL Intravenous Q12H Lance Coon, MD   3 mL at 11/16/15 0819  . venlafaxine (EFFEXOR)  tablet 50 mg  50 mg Oral BID WC Flora Lipps, MD   50 mg at 11/16/15 0818    Musculoskeletal: Strength & Muscle Tone: decreased Gait & Station: unable to stand Patient leans: N/A  Psychiatric Specialty Exam: Review of Systems  HENT: Negative.   Eyes: Negative.   Respiratory: Negative.   Cardiovascular: Negative.   Gastrointestinal: Negative.   Musculoskeletal: Positive for myalgias.  Skin: Negative.   Neurological: Positive for weakness.  Psychiatric/Behavioral: Positive for memory loss. Negative for depression, suicidal ideas, hallucinations and substance abuse. The patient is not nervous/anxious and does not have insomnia.     Blood pressure 140/67, pulse 88, temperature 98.4 F (36.9 C), temperature source Oral, resp. rate 16, height '5\' 6"'  (1.676 m), weight 126.1 kg (278 lb), SpO2 94 %.Body mass index is 44.89 kg/(m^2).  General Appearance: Casual  Eye Contact::  Fair  Speech:  Slow  Volume:  Decreased  Mood:  Euthymic  Affect:  Appropriate  Thought Process:  Goal Directed  Orientation:  Full (Time, Place, and Person)  Thought Content:  Negative  Suicidal Thoughts:  No  Homicidal Thoughts:  No  Memory:  Immediate;   Good Recent;   Fair Remote;   Poor  Judgement:  Fair  Insight:  Lacking  Psychomotor Activity:  Decreased  Concentration:  Fair  Recall:  [employer] of Knowledge:Fair  Language: Fair  Akathisia:  No  Handed:  Right  AIMS (if indicated):     Assets:  Financial Resources/Insurance Housing Resilience Social Support  ADL's:  Impaired  Cognition: Impaired,  Mild  Sleep:      Treatment Plan Summary: Daily contact with patient to assess and evaluate symptoms and progress in treatment, Medication management and Plan 55 year old woman with a history of chronic mood instability with a lot of behavior and substance abuse problems in the past. She has survived multiple medical complications that seem to been related to an overdose. Husband is insisting that  she overdosed on Provigil. As near as I can tell from everything that I read nothing about her presentation is consistent with a Provigil overdose. Seems to me probably more likely to involve benzodiazepines and possibly other drugs. In any case, at this point, she is denying other substance abuse, denying suicidal ideation, showing a calm affect and is basically cooperative. Patient is not going to need transfer to the psychiatry ward. Based on Jamie-term observation of her psychiatric history, I think it's clear that this patient should not be prescribed any controlled substances. There really is no indication at all for her to be taking Provigil and never has been them. There is really no reason for her to be taking Xanax with her history of substance abuse either. Fortunately, it looks like she is not getting any Xanax, even though it is written when necessary. I am going to discontinue that. Definitely would not restart the Provigil. Patient will presumably follow up with psychiatry in our clinic again after discharge. Right now it seems a little up in the air about whether she will be going to rehabilitation skilled nursing or home when she is discharged. I will continue to follow up regularly.  Disposition: Patient does not meet criteria for psychiatric inpatient admission. Supportive therapy provided about ongoing stressors.  Alethia Berthold, MD 11/16/2015 3:08 PM

## 2015-11-16 NOTE — Progress Notes (Signed)
[facility] [place] NAME: Jamie Bennett    MRN#:  [id number]  DATE OF BIRTH:  04-Sep-1960  SUBJECTIVE:  Hospital Day: 8 days Jamie Bennett is a 55 y.o. female presenting with Drug Overdose .   Overnight events: Remained stable, good urine out put.  Interval Events: Still has generalized weakness, husband at bedside no focal complaints  REVIEW OF SYSTEMS:  CONSTITUTIONAL: No fever, positive fatigue or weakness.  EYES: No blurred or double vision.  EARS, NOSE, AND THROAT: No tinnitus or ear pain.  RESPIRATORY: No cough, shortness of breath, wheezing or hemoptysis.  CARDIOVASCULAR: No chest pain, orthopnea, edema.  GASTROINTESTINAL: No nausea, vomiting, diarrhea or abdominal pain.  GENITOURINARY: No dysuria, hematuria.  ENDOCRINE: No polyuria, nocturia,  HEMATOLOGY: No anemia, easy bruising or bleeding SKIN: No rash or lesion. MUSCULOSKELETAL: No joint pain or arthritis.   NEUROLOGIC: No tingling, numbness, weakness.  PSYCHIATRY: No anxiety or depression.   DRUG ALLERGIES:  No Known Allergies  VITALS:  Blood pressure 136/84, pulse 92, temperature 98.3 F (36.8 C), temperature source Oral, resp. rate 18, height 5\' 6"  (1.676 m), weight 126.1 kg (278 lb), SpO2 95 %.  PHYSICAL EXAMINATION:  VITAL SIGNS: Filed Vitals:   11/16/15 1134 11/16/15 1629  BP:  136/84  Pulse: 88 92  Temp:  98.3 F (36.8 C)  Resp:  57   GENERAL:54 y.o.female currently in no acute distress.  HEAD: Normocephalic, atraumatic.  EYES: Pupils equal, round, reactive to light. Extraocular muscles intact. No scleral icterus.  MOUTH: Moist mucosal membrane. Dentition intact. No abscess noted.  EAR, NOSE, THROAT: Clear without exudates. No external lesions.  NECK: Supple. No thyromegaly. No nodules. No JVD.  PULMONARY: Clear to ascultation, without wheeze rails or rhonci. No use of accessory muscles, Good respiratory effort. good air entry  bilaterally CHEST: Nontender to palpation.  CARDIOVASCULAR: S1 and S2. Regular rate and rhythm. No murmurs, rubs, or gallops. No edema. Pedal pulses 2+ bilaterally.  GASTROINTESTINAL: Soft, nontender, nondistended. No masses. Positive bowel sounds. No hepatosplenomegaly.  MUSCULOSKELETAL: No swelling, clubbing, or edema. Range of motion full in all extremities.  NEUROLOGIC: Cranial nerves II through XII are intact. No gross focal neurological deficits. Sensation intact. Reflexes intact.  SKIN: No ulceration, lesions, rashes, or cyanosis. Skin warm and dry. Turgor intact.  PSYCHIATRIC: Mood, affect flat. The patient is awake, alert and oriented x 3. Insight, judgment intact. Speech pattern is very slow but appropriate     LABORATORY PANEL:   CBC  Recent Labs Lab 11/16/15 0726  WBC 8.9  HGB 9.2*  HCT 27.1*  PLT 103*   ------------------------------------------------------------------------------------------------------------------  Chemistries   Recent Labs Lab 11/14/15 0505  11/16/15 0726  NA 141  < > 141  K 3.2*  < > 3.3*  CL 104  < > 110  CO2 26  < > 27  GLUCOSE 82  < > 87  BUN 72*  < > 38*  CREATININE 3.07*  < > 1.13*  CALCIUM 8.0*  < > 8.3*  MG 1.8  --   --   AST  --   --  24  ALT  --   --  103*  ALKPHOS  --   --  151*  BILITOT  --   --  0.6  < > = values in this interval not displayed. ------------------------------------------------------------------------------------------------------------------  Cardiac Enzymes No results for input(s): TROPONINI in the last 168 hours. ------------------------------------------------------------------------------------------------------------------  RADIOLOGY:  No results found.  EKG:  Orders placed or performed during the hospital encounter of 11/08/15  . ED EKG  . EKG 12-Lead  . EKG 12-Lead  . ED EKG  . EKG 12-Lead  . EKG 12-Lead    ASSESSMENT AND PLAN:   Jamie Bennett is a 56 y.o. female presenting  with Drug Overdose . Admitted 11/08/2015 : Day #: 8 days 1. Metabolic encephalopathy secondary to potential overdose patient makes continued improvements each day avoid overly sedating agents will attempt certainly minimizing narcotics   Psych seen her, and suggested no acute need for inpatient psych admission. 2. Acute renal failure: Nephrology following , iniotially required hemodialysis. rechecked BMP this morning to assess kidney function and no further dialysis can discontinue femoral catheter 3. MSSA pneumonia continue oxygen and breathing treatments ceftriaxone day #6/7, give total 7 days. 4. Klebsiella urinary tract infection: Ceftriaxone total for 7 days. 5. Venous thromboembolism prophylactic: Heparin 6. Generalized weakness   May need rehab placement. 7. Acute hepatic failure- on admission- duet o drug overdose, improved now.  All the records are reviewed and case discussed with Care Management/Social Workerr. Management plans discussed with the patient, family and they are in agreement.  CODE STATUS: full TOTAL TIME TAKING CARE OF THIS PATIENT: 28 minutes.   POSSIBLE D/C IN 2-3DAYS, DEPENDING ON CLINICAL CONDITION. Discussed with her husband in room today.  [name] M.D on 11/16/2015 at 5:26 PM  Between 7am to 6pm - Pager - [phone]  After 6pm: House Pager: - [phone]  [facility]  Office  [phone]  CC: Primary care physician; Lavera Guise, MD

## 2015-11-16 NOTE — Evaluation (Signed)
Physical Therapy Evaluation Patient Details Name: Jamie Bennett MRN: [id number] DOB: 08-27-1960 Today's Date: 11/16/2015   History of Present Illness  Jamie Bennett is a 55yo white female who comes to [facility] on 4/10 after worsening lethargy and weakness, inability to stand or walk. Pt has told husband that she has been sneaking additional Provigil and suspected to have overdosed, similar to prior episode.  PMH: RA, bipolar disorder, OP, fibromyalgia, and OA. Baseline level of function included totla indep in ADL, IADL, and community distance AMB s AD. Husband reports no history of falls, but says that pt seems to have depressed ability to recover from LOB at baseline. Pt intubated on 4/11, extubated on 4/13. Femoral cath removed on 4/18.  Clinical Impression  Pt is a pleasant 55 y.o. F admitted to hospital for lethargy and weakness due to drug overdose. Prior to admission, pt lived at home with husband. Pt performed all ADLs independently and was fairly active. Pt owns assistive devices, however did not typically use any for ambulation. Pt alert and oriented during the evaluation. Pt demonstrated poor B UE and LE strength and ROM. Pt able to perform bed mobility with use of railings and 2+ max assist. Pt able to transfer from sit to stand with RW and 2+ mod assist. Pt able to ambulate approx. 3 ft using RW and 2+ max assist with heavy cues regarding foot and walker placement. Pt performed supine there-ex with mod to no assist. Prior to ambulation, pts O2 sat at 95% on 2L. Pt on room air for ambulation, O2 remained at 95%. Left pt on room air after session, RN notified. Pt demonstrates deficits in strength, ROM, balance, and mobility. Pt would benefit from further skilled PT to address deficits and return to PLOF; recommend pt sent to SNF after discharge from acute hospitalization.     Follow Up Recommendations SNF    Equipment Recommendations       Recommendations for Other Services        Precautions / Restrictions Precautions Precautions: Fall Restrictions Weight Bearing Restrictions: No      Mobility  Bed Mobility Overal bed mobility: Needs Assistance;+2 for physical assistance Bed Mobility: Supine to Sit     Supine to sit: Max assist;+2 for physical assistance     General bed mobility comments: Pt able to perform bed mobility with use of railings and 2+ max assist. Pt required assist moving B LE and to upright trunk. Pt provided frequent cues for hand placement and use of UE to upright trunk.   Transfers Overall transfer level: Needs assistance Equipment used: Rolling walker (2 wheeled) Transfers: Sit to/from Stand Sit to Stand: Mod assist;+2 physical assistance         General transfer comment: Pt required 2+ mod assist and RW to transfer from EOB. Pt required heavy cues for hand placement. Bed elevated for assist with transfer.   Ambulation/Gait Ambulation/Gait assistance: +2 physical assistance;Max assist Ambulation Distance (Feet): 3 Feet Assistive device: Rolling walker (2 wheeled) Gait Pattern/deviations: Shuffle Gait velocity: slow   General Gait Details: Pt able to ambulate approx 3 ft using RW and 2+ max assist. Pt required heavy cues regarding foot and walker placement. Pt required assist with moving walker. Pt demonstrated short shuffle steps with frequent cues to move feet.   Stairs            Wheelchair Mobility    Modified Rankin (Stroke Patients Only)       Balance Overall balance assessment: Needs assistance  Sitting-balance support: Bilateral upper extremity supported Sitting balance-Leahy Scale: Fair Sitting balance - Comments: Pt able to sit at EOB using B UE and CGA. Pt demonstrated forward trunk lean while sitting.    Standing balance support: Bilateral upper extremity supported Standing balance-Leahy Scale: Fair Standing balance comment: Pt able to maintain standing balance with RW and 2+ min assist. Upon standing,  pt given increased time to maintain balance but did not experience any LOB.                              Pertinent Vitals/Pain Pain Assessment: 0-10 Pain Score: 8  Pain Location: lower abdomen  Pain Descriptors / Indicators: Aching;Discomfort Pain Intervention(s): Limited activity within patient's tolerance    Home Living Family/patient expects to be discharged to:: Private residence Living Arrangements: Spouse/significant other Available Help at Discharge: Family Type of Home: House Home Access: Stairs to enter Entrance Stairs-Rails:  (no rail, husband built bar to use) Technical brewer of Steps: 3 Home Layout: Two level;Able to live on main level with bedroom/bathroom Home Equipment: Gilford Rile - 2 wheels;Cane - single point      Prior Function Level of Independence: Independent         Comments: Pt was independent with all ADLs. Husband available to assist when needed. Pt was fairly active, walked and participate in aquatic therapy for FM.      Hand Dominance        Extremity/Trunk Assessment   Upper Extremity Assessment: RUE deficits/detail;LUE deficits/detail RUE Deficits / Details: R UE grossly 4-/5 strength, limited shoulder flex ROM     LUE Deficits / Details: L UE grossly 4-/5 strength, limited shoulder flex ROM   Lower Extremity Assessment: RLE deficits/detail;LLE deficits/detail RLE Deficits / Details: R LE grossly 3+/5, limited ROM LLE Deficits / Details: L LE grossly 3+/5, limited ROM     Communication   Communication: No difficulties  Cognition Arousal/Alertness: Awake/alert Behavior During Therapy: WFL for tasks assessed/performed Overall Cognitive Status: Within Functional Limits for tasks assessed                      General Comments      Exercises Other Exercises Other Exercises: Pt able to perform supine ther-ex on B LE including SLR, hip ab/ad, and ankle pumps. Pt required mod assist for SLR and hip ab/ad, and no  assist for ankle pumps. All ther-ex performed x10 reps.       Assessment/Plan    PT Assessment Patient needs continued PT services  PT Diagnosis Difficulty walking;Abnormality of gait;Generalized weakness   PT Problem List Decreased strength;Decreased range of motion;Decreased activity tolerance;Decreased balance;Decreased mobility;Decreased knowledge of use of DME  PT Treatment Interventions DME instruction;Gait training;Stair training;Therapeutic activities;Therapeutic exercise;Balance training   PT Goals (Current goals can be found in the Care Plan section) Acute Rehab PT Goals Patient Stated Goal: to return to PLOF PT Goal Formulation: With patient Time For Goal Achievement: 11/30/15 Potential to Achieve Goals: Good    Frequency Min 2X/week   Barriers to discharge        Co-evaluation               End of Session Equipment Utilized During Treatment: Gait belt;Oxygen Activity Tolerance: Patient tolerated treatment well Patient left: in chair;with chair alarm set;with family/visitor present Nurse Communication: Mobility status         Time: [id number] PT Time Calculation (min) (ACUTE ONLY): 30 min  Charges:         PT G Codes:        Sherral Hammers 2015/11/29, 11:46 AM M. Barnett Abu, SPT

## 2015-11-17 ENCOUNTER — Ambulatory Visit: Payer: Medicare HMO | Admitting: Psychiatry

## 2015-11-17 LAB — MAGNESIUM: Magnesium: 1.2 mg/dL — ABNORMAL LOW (ref 1.7–2.4)

## 2015-11-17 LAB — BASIC METABOLIC PANEL WITH GFR
Anion gap: 9 (ref 5–15)
BUN: 23 mg/dL — ABNORMAL HIGH (ref 6–20)
CO2: 23 mmol/L (ref 22–32)
Calcium: 8.3 mg/dL — ABNORMAL LOW (ref 8.9–10.3)
Chloride: 110 mmol/L (ref 101–111)
Creatinine, Ser: 0.91 mg/dL (ref 0.44–1.00)
GFR calc Af Amer: >60 mL/min
GFR calc non Af Amer: >60 mL/min
Glucose, Bld: 78 mg/dL (ref 65–99)
Potassium: 4.8 mmol/L (ref 3.5–5.1)
Sodium: 142 mmol/L (ref 135–145)

## 2015-11-17 MED ORDER — MAGNESIUM SULFATE 2 GM/50ML IV SOLN
2.0000 g | Freq: Once | INTRAVENOUS | Status: AC
  Administered 2015-11-17: 2 g via INTRAVENOUS
  Filled 2015-11-17: qty 50

## 2015-11-17 MED ORDER — CYANOCOBALAMIN 1000 MCG/ML IJ SOLN
1000.0000 ug | Freq: Once | INTRAMUSCULAR | Status: AC
  Administered 2015-11-17: 1000 ug via SUBCUTANEOUS
  Filled 2015-11-17: qty 1

## 2015-11-17 MED ORDER — OXYCODONE HCL 5 MG PO TABS
15.0000 mg | ORAL_TABLET | ORAL | Status: AC
  Administered 2015-11-17: 15 mg via ORAL
  Filled 2015-11-17: qty 3

## 2015-11-17 NOTE — Consult Note (Signed)
[facility] Face-to-Face Psychiatry Consult   Reason for Consult:  Consult for this 55 year old woman with a history of mood instability is recovering from an overdose of unknown substances Referring Physician:  Anselm Jungling Patient Identification: Jamie Bennett MRN:  [id number] Principal Diagnosis: Bipolar 1 disorder, depressed ([place]) Diagnosis:   Patient Active Problem List   Diagnosis Date Noted  . AKI (acute kidney injury) ([place]) [N17.9] 11/09/2015  . Elevated troponin [R79.89] 11/09/2015  . Hypotension [I95.9] 11/09/2015  . Respiratory failure ([place]) [J96.90]   . Acute hepatic failure [K72.00] 11/08/2015  . Drug overdose [T50.901A] 11/08/2015  . Fatty infiltration of liver [K76.0] 02/16/2015  . Hepatic fibrosis ([place]) [K74.0] 02/16/2015  . Abnormal serum level of alkaline phosphatase [R74.8] 02/15/2015  . Anemia [D64.9] 02/05/2015  . Vitamin B 12 deficiency [E53.8] 02/05/2015  . OP (osteoporosis) [M81.0] 06/16/2014  . Rheumatoid arteritis [I00] 03/02/2014  . Osteoarthritis of left hip [M16.12] 03/02/2014  . Hypothyroidism [E03.9] 03/02/2014  . Rheumatic fever without heart involvement [I00] 03/02/2014  . Adult hypothyroidism [E03.9] 03/02/2014  . Arthritis of pelvic region, degenerative [M16.9] 03/02/2014  . Bipolar 1 disorder, depressed ([place]) [F31.9] 02/26/2014  . Bariatric surgery status [Z98.84] 11/24/2013  . Affective bipolar disorder ([place]) [F31.9] 11/24/2013  . Bipolar affective disorder ([place]) [F31.9] 11/24/2013  . Rheumatoid arthritis ([place]) [M06.9] 11/24/2013  . Polysubstance (excluding opioids) dependence ([place]) [F19.20] 09/11/2013  . Polysubstance dependence ([place]) [F19.20] 09/11/2013  . Combined drug dependence excluding opioids ([place]) [F19.20] 09/11/2013  . Arthritis or polyarthritis, rheumatoid ([place]) [M06.9] 09/05/2013  . Leg weakness [R29.898] 09/05/2013    Total Time spent with patient: 1 hour  Subjective:   Jamie Bennett is a 55 y.o. female patient admitted with  "I'm feeling better now.".  Follow-up evaluation Wednesday the 19th. Patient seen chart reviewed. Today patient appeared to be more alert and awake and interactive. Made good eye contact. Smiling. Reports that her mood is feeling good. She says that she did cooperate with physical therapy and was able to get up and walk a little bit today although she complains greatly of being in pain as a result. She says she is trying to eat and drink more. She is having no difficulty with swallowing at this point. Denies any depression completely denies any suicidal thoughts.  HPI:  Patient interviewed. Husband was present as well. Chart reviewed including notes throughout this hospitalization as well as prior psychiatric history. Labs and vitals reviewed. 55 year old woman presented to the hospital a little over a week ago with acute mental status changes, confusion. Initial unresponsiveness low blood pressure. Vital signs were low and she was having renal and hepatic shutdown. He has been in the critical care unit for several days and is now finally out on the floor. Patient was receiving dialysis briefly but that seems to resolve. Labs are all improving. From the beginning. The husband has insisted that this is the result of an overdose of Provigil. On interview today, the patient seems unclear about it and apparently is just answering questions based on what her husband has told her. She says that she remembers having been depressed. She seems a little vague in her supposedly memories of taking excessive amounts of Provigil. Tends to answer questions by saying "it must of been quite a bit". She denies that she had been abusing any of her other drugs which the husband also insists on. Reports that she had been depressed, but they both seemed insistent that she couldn't have been trying to kill herself. Patient  denies any thoughts about wanting to hurt or kill herself now. Says that her mood is feeling much better. Not  feeling acutely depressed now. Denies having any current hallucinations. Says she had some transiently while she was in the critical care unit, but that has all resolved. She had not been drinking or using any other substances. She gets her outpatient psychiatric care through doctors in the [facility] clinic. She had been seeing Dr. Jimmye Norman, until he left, but has not seen a psychiatrist since January. He is scheduled to see the replacement. Dr. sometime soon. Doesn't report any particular recent stressor.  Social history: Patient lives with her husband. Husband appears to be very supportive of her. She is not working outside the home.  Medical history: Extensive medical history including history of rheumatoid arthritis, hypothyroidism, history of chronic anemia, probably iron deficiency, history of arthritis. Surgery.  Substance abuse history: Looking back over her old psychiatry notes it's clear that she has a history of pretty significant substance abuse. This was really the major issue that off and let her to psychiatric attention in past years and as recently as a little over a year ago. Has a history of drinking abusively, as well as abusing prescriptions benzodiazepines.  Past Psychiatric History: Patient has a history of mood instability. Had been variously diagnosed as depressed, bipolar, and having borderline personality disorder. As near as I can tell, she really doesn't have a history of mania, especially not psychotic mania. As noted previously. A lot of her emergency presentations of also involve substance abuse. She does have a past history of suicide attempts and agitated behavior. Has had more than one hospitalization in the past, probably not a little over a year.  Risk to Self: Is patient at risk for suicide?: No Risk to Others:   Prior Inpatient Therapy:   Prior Outpatient Therapy:    Past Medical History:  Past Medical History  Diagnosis Date  . Osteoarthritis   .  Osteoporosis   . Rheumatoid arthritis ([place])   . Thyroid disease   . Hypotension   . Constipation   . Anemia   . Bipolar 1 disorder ([place])   . Anxiety   . Fibromyalgia     Past Surgical History  Procedure Laterality Date  . Gastric bypass    . Cholecystectomy     Family History:  Family History  Problem Relation Age of Onset  . Depression Mother   . Dementia Mother   . Heart disease Mother   . Parkinson's disease Father    Family Psychiatric  History: Patient is not aware of any significant family history of mental illness Social History:  History  Alcohol Use No     History  Drug Use No    Social History   Social History  . Marital Status: Married    Spouse Name: N/A  . Number of Children: N/A  . Years of Education: N/A   Social History Main Topics  . Smoking status: Never Smoker   . Smokeless tobacco: None  . Alcohol Use: No  . Drug Use: No  . Sexual Activity: Yes   Other Topics Concern  . None   Social History Narrative   Additional Social History:    Allergies:  No Known Allergies  Labs:  Results for orders placed or performed during the hospital encounter of 11/08/15 (from the past 48 hour(s))  Comprehensive metabolic panel     Status: Abnormal   Collection Time: 11/16/15  7:26 AM  Result Value Ref Range   Sodium 141 135 - 145 mmol/L   Potassium 3.3 (L) 3.5 - 5.1 mmol/L   Chloride 110 101 - 111 mmol/L   CO2 27 22 - 32 mmol/L   Glucose, Bld 87 65 - 99 mg/dL   BUN 38 (H) 6 - 20 mg/dL   Creatinine, Ser 1.13 (H) 0.44 - 1.00 mg/dL   Calcium 8.3 (L) 8.9 - 10.3 mg/dL   Total Protein 5.5 (L) 6.5 - 8.1 g/dL   Albumin 2.0 (L) 3.5 - 5.0 g/dL   AST 24 15 - 41 U/L   ALT 103 (H) 14 - 54 U/L   Alkaline Phosphatase 151 (H) 38 - 126 U/L   Total Bilirubin 0.6 0.3 - 1.2 mg/dL   GFR calc non Af Amer 54 (L) >60 mL/min   GFR calc Af Amer >60 >60 mL/min    Comment: (NOTE) The eGFR has been calculated using the CKD EPI equation. This calculation has not been  validated in all clinical situations. eGFR's persistently <60 mL/min signify possible Chronic Kidney Disease.    Anion gap 4 (L) 5 - 15  CBC     Status: Abnormal   Collection Time: 11/16/15  7:26 AM  Result Value Ref Range   WBC 8.9 3.6 - 11.0 K/uL   RBC 2.85 (L) 3.80 - 5.20 MIL/uL   Hemoglobin 9.2 (L) 12.0 - 16.0 g/dL   HCT 27.1 (L) 35.0 - 47.0 %   MCV 95.1 80.0 - 100.0 fL   MCH 32.5 26.0 - 34.0 pg   MCHC 34.1 32.0 - 36.0 g/dL   RDW 16.7 (H) 11.5 - 14.5 %   Platelets 103 (L) 150 - 440 K/uL  Glucose, capillary     Status: None   Collection Time: 11/16/15  7:43 AM  Result Value Ref Range   Glucose-Capillary 78 65 - 99 mg/dL   Comment 1 Notify RN   Magnesium     Status: Abnormal   Collection Time: 11/17/15  6:01 AM  Result Value Ref Range   Magnesium 1.2 (L) 1.7 - 2.4 mg/dL  Basic metabolic panel     Status: Abnormal   Collection Time: 11/17/15  6:01 AM  Result Value Ref Range   Sodium 142 135 - 145 mmol/L   Potassium 4.8 3.5 - 5.1 mmol/L    Comment: HEMOLYSIS AT THIS LEVEL MAY AFFECT RESULT   Chloride 110 101 - 111 mmol/L   CO2 23 22 - 32 mmol/L   Glucose, Bld 78 65 - 99 mg/dL   BUN 23 (H) 6 - 20 mg/dL   Creatinine, Ser 0.91 0.44 - 1.00 mg/dL   Calcium 8.3 (L) 8.9 - 10.3 mg/dL   GFR calc non Af Amer >60 >60 mL/min   GFR calc Af Amer >60 >60 mL/min    Comment: (NOTE) The eGFR has been calculated using the CKD EPI equation. This calculation has not been validated in all clinical situations. eGFR's persistently <60 mL/min signify possible Chronic Kidney Disease.    Anion gap 9 5 - 15    Current Facility-Administered Medications  Medication Dose Route Frequency Provider Last Rate Last Dose  . alum & mag hydroxide-simeth (MAALOX/MYLANTA) 200-200-20 MG/5ML suspension 15 mL  15 mL Oral Q4H PRN Mikael Spray, NP      . bisacodyl (DULCOLAX) suppository 10 mg  10 mg Rectal Daily PRN Wilhelmina Mcardle, MD   10 mg at 11/12/15 0953  . busPIRone (BUSPAR) tablet 10 mg  10 mg  Oral TID Laverle Hobby, MD   10 mg at 11/17/15 1619  . cephALEXin (KEFLEX) capsule 500 mg  500 mg Oral 3 times per day Wilhelmina Mcardle, MD   500 mg at 11/17/15 1408  . dextrose 5 % and 0.45 % NaCl with KCl 20 mEq/L infusion   Intravenous Continuous Murlean Iba, MD 50 mL/hr at 11/17/15 1618    . enoxaparin (LOVENOX) injection 40 mg  40 mg Subcutaneous Q24H [name], MD   40 mg at 11/16/15 2035  . guaiFENesin (MUCINEX) 12 hr tablet 600 mg  600 mg Oral BID PRN Wilhelmina Mcardle, MD      . ipratropium-albuterol (DUONEB) 0.5-2.5 (3) MG/3ML nebulizer solution 3 mL  3 mL Nebulization Q4H PRN Laverle Hobby, MD   3 mL at 11/13/15 1311  . lamoTRIgine (LAMICTAL) tablet 150 mg  150 mg Oral BID Laverle Hobby, MD   150 mg at 11/17/15 0930  . levothyroxine (SYNTHROID, LEVOTHROID) tablet 100 mcg  100 mcg Oral QAC breakfast Wilhelmina Mcardle, MD   100 mcg at 11/17/15 0930  . metoprolol (LOPRESSOR) injection 2.5-5 mg  2.5-5 mg Intravenous Q3H PRN Wilhelmina Mcardle, MD      . ondansetron [facility]) injection 4 mg  4 mg Intravenous Q6H PRN [name], MD      . oxyCODONE (Oxy IR/ROXICODONE) immediate release tablet 5 mg  5 mg Oral Q4H PRN [name], MD   5 mg at 11/17/15 1330  . pantoprazole (PROTONIX) EC tablet 40 mg  40 mg Oral Daily [name], MD   40 mg at 11/17/15 0930  . sodium chloride flush (NS) 0.9 % injection 10-40 mL  10-40 mL Intracatheter Q12H Flora Lipps, MD   10 mL at 11/15/15 0929  . sodium chloride flush (NS) 0.9 % injection 10-40 mL  10-40 mL Intracatheter PRN Flora Lipps, MD      . sodium chloride flush (NS) 0.9 % injection 3 mL  3 mL Intravenous Q12H Lance Coon, MD   3 mL at 11/17/15 0934  . venlafaxine (EFFEXOR) tablet 50 mg  50 mg Oral BID WC Flora Lipps, MD   50 mg at 11/17/15 1618    Musculoskeletal: Strength & Muscle Tone: decreased Gait & Station: unable to stand Patient leans: N/A  Psychiatric Specialty Exam: Review of  Systems  HENT: Negative.   Eyes: Negative.   Respiratory: Negative.   Cardiovascular: Negative.   Gastrointestinal: Negative.   Musculoskeletal: Positive for myalgias.  Skin: Negative.   Neurological: Positive for weakness.  Psychiatric/Behavioral: Positive for memory loss. Negative for depression, suicidal ideas, hallucinations and substance abuse. The patient is not nervous/anxious and does not have insomnia.     Blood pressure 128/70, pulse 91, temperature 98 F (36.7 C), temperature source Oral, resp. rate 18, height '5\' 6"'$  (1.676 m), weight 126.1 kg (278 lb), SpO2 96 %.Body mass index is 44.89 kg/(m^2).  General Appearance: Casual  Eye Contact::  Fair  Speech:  Slow  Volume:  Decreased  Mood:  Euthymic  Affect:  Appropriate  Thought Process:  Goal Directed  Orientation:  Full (Time, Place, and Person)  Thought Content:  Negative  Suicidal Thoughts:  No  Homicidal Thoughts:  No  Memory:  Immediate;   Good Recent;   Fair Remote;   Poor  Judgement:  Fair  Insight:  Lacking  Psychomotor Activity:  Decreased  Concentration:  Fair  Recall:  [place]  Language: Fair  Akathisia:  No  Handed:  Right  AIMS (if indicated):     Assets:  Financial Resources/Insurance Housing Resilience Social Support  ADL's:  Impaired  Cognition: Impaired,  Mild  Sleep:      Treatment Plan Summary: Daily contact with patient to assess and evaluate symptoms and progress in treatment, Medication management and Plan Patient with history of substance abuse including prescription drug abuse currently recovering from an overdose of unknown substances. Patient is more alert and awake today and appears to be more cooperative with therapy and rehabilitation efforts. She seems to be tolerating being off of benzodiazepines fine. I would encourage a gradual but definite taper off of narcotics. Continue physical therapy. Speech therapy at this point seems to no longer be necessary but of  course I would defer to their judgment. Encouraging therapy and support with the patient. No change to psychiatric medicine. Continue off of Provigil and other stimulants as well.  Disposition: Patient does not meet criteria for psychiatric inpatient admission. Supportive therapy provided about ongoing stressors.  Alethia Berthold, MD 11/17/2015 7:40 PM

## 2015-11-17 NOTE — Progress Notes (Signed)
[facility] NAME: Jamie Bennett    MRN#:  [id number]  DATE OF BIRTH:  20-Mar-1961  SUBJECTIVE:  Hospital Day: 9 days Jamie Bennett is a 55 y.o. female presenting with Drug Overdose .   Overnight events: Remained stable, good urine out put.  Interval Events: Still has generalized weakness, husband at bedside no focal complaints   Waiting for rehab placement.  REVIEW OF SYSTEMS:  CONSTITUTIONAL: No fever, positive fatigue or weakness.  EYES: No blurred or double vision.  EARS, NOSE, AND THROAT: No tinnitus or ear pain.  RESPIRATORY: No cough, shortness of breath, wheezing or hemoptysis.  CARDIOVASCULAR: No chest pain, orthopnea, edema.  GASTROINTESTINAL: No nausea, vomiting, diarrhea or abdominal pain.  GENITOURINARY: No dysuria, hematuria.  ENDOCRINE: No polyuria, nocturia,  HEMATOLOGY: No anemia, easy bruising or bleeding SKIN: No rash or lesion. MUSCULOSKELETAL: No joint pain or arthritis.   NEUROLOGIC: No tingling, numbness, weakness.  PSYCHIATRY: No anxiety or depression.   DRUG ALLERGIES:  No Known Allergies  VITALS:  Blood pressure 145/69, pulse 88, temperature 98.4 F (36.9 C), temperature source Oral, resp. rate 18, height 5\' 6"  (1.676 m), weight 126.1 kg (278 lb), SpO2 96 %.  PHYSICAL EXAMINATION:  VITAL SIGNS: Filed Vitals:   11/17/15 1424 11/17/15 2050  BP: 128/70 145/69  Pulse: 91 88  Temp: 98 F (36.7 C) 98.4 F (36.9 C)  Resp: 18 18   GENERAL:54 y.o.female currently in no acute distress.  HEAD: Normocephalic, atraumatic.  EYES: Pupils equal, round, reactive to light. Extraocular muscles intact. No scleral icterus.  MOUTH: Moist mucosal membrane. Dentition intact. No abscess noted.  EAR, NOSE, THROAT: Clear without exudates. No external lesions.  NECK: Supple. No thyromegaly. No nodules. No JVD.  PULMONARY: Clear to ascultation, without wheeze rails or rhonci. No use of accessory  muscles, Good respiratory effort. good air entry bilaterally CHEST: Nontender to palpation.  CARDIOVASCULAR: S1 and S2. Regular rate and rhythm. No murmurs, rubs, or gallops. No edema. Pedal pulses 2+ bilaterally.  GASTROINTESTINAL: Soft, nontender, nondistended. No masses. Positive bowel sounds. No hepatosplenomegaly.  MUSCULOSKELETAL: No swelling, clubbing, or edema. Range of motion full in all extremities.  NEUROLOGIC: Cranial nerves II through XII are intact. No gross focal neurological deficits. Sensation intact. Reflexes intact.  SKIN: No ulceration, lesions, rashes, or cyanosis. Skin warm and dry. Turgor intact.  PSYCHIATRIC: Mood, affect flat. The patient is awake, alert and oriented x 3. Insight, judgment intact. Speech pattern is very slow but appropriate     LABORATORY PANEL:   CBC  Recent Labs Lab 11/16/15 0726  WBC 8.9  HGB 9.2*  HCT 27.1*  PLT 103*   ------------------------------------------------------------------------------------------------------------------  Chemistries   Recent Labs Lab 11/16/15 0726 11/17/15 0601  NA 141 142  K 3.3* 4.8  CL 110 110  CO2 27 23  GLUCOSE 87 78  BUN 38* 23*  CREATININE 1.13* 0.91  CALCIUM 8.3* 8.3*  MG  --  1.2*  AST 24  --   ALT 103*  --   ALKPHOS 151*  --   BILITOT 0.6  --    ------------------------------------------------------------------------------------------------------------------  Cardiac Enzymes No results for input(s): TROPONINI in the last 168 hours. ------------------------------------------------------------------------------------------------------------------  RADIOLOGY:  No results found.  EKG:   Orders placed or performed during the hospital encounter of 11/08/15  . ED EKG  . EKG 12-Lead  . EKG 12-Lead  . ED EKG  . EKG 12-Lead  . EKG 12-Lead  ASSESSMENT AND PLAN:   Jamie Bennett is a 55 y.o. female presenting with Drug Overdose . Admitted 11/08/2015 : Day #: 9 days 1.  Metabolic encephalopathy secondary to potential overdose patient makes continued improvements each day avoid overly sedating agents will attempt certainly minimizing narcotics   Psych seen her, and suggested no acute need for inpatient psych admission. 2. Acute renal failure: Nephrology following , iniotially required hemodialysis. rechecked BMP this morning to assess kidney function and no further dialysis can discontinue femoral catheter 3. MSSA pneumonia continue oxygen and breathing treatments ceftriaxone day #6/7, give total 7 days. 4. Klebsiella urinary tract infection: Ceftriaxone total for 7 days. 5. Venous thromboembolism prophylactic: Heparin 6. Generalized weakness  need rehab placement. Social worker is working on Biochemist, clinical. 7. Acute hepatic failure- on admission- duet o drug overdose, improved now.  All the records are reviewed and case discussed with Care Management/Social Workerr. Management plans discussed with the patient, family and they are in agreement.  CODE STATUS: full TOTAL TIME TAKING CARE OF THIS PATIENT: 28 minutes.   POSSIBLE D/C IN 2-3DAYS, DEPENDING ON CLINICAL CONDITION. Discussed with her husband in room today.  [name] M.D on 11/17/2015 at 11:12 PM  Between 7am to 6pm - Pager - [phone]  After 6pm: House Pager: - [phone]  [facility]  Office  [phone]  CC: Primary care physician; Lavera Guise, MD

## 2015-11-17 NOTE — Care Management Important Message (Signed)
Important Message  Patient Details  Name: Jamie Bennett MRN: [id number] Date of Birth: 1961/03/25   Medicare Important Message Given:  Yes    [name] 11/17/2015, 10:52 AM

## 2015-11-17 NOTE — Progress Notes (Signed)
Physical Therapy Treatment Patient Details Name: Jamie Bennett MRN: [id number] DOB: 1960-09-02 Today's Date: 11/17/2015    History of Present Illness Jamie Bennett is a 55yo white female who [facility] on 4[facility]10 after worsening lethargy and weakness, inability to stand or walk. Pt has told husband that she has been sneaking additional Provigil and suspected to have overdosed, similar to prior episode.  PMH: RA, bipolar disorder, OP, fibromyalgia, and OA. Baseline level of function included totla indep in ADL, IADL, and community distance AMB s AD. Husband reports no history of falls, but says that pt seems to have depressed ability to recover from LOB at baseline. Pt extubated on 4/13 to 2L [place], and remains with temporary femoral dialysis catheter. Pt not on HD at baseline.     PT Comments    Pt is making good progress towards goals with increased functional independence noted this session. Pt complains of pain with all mobility, limiting further distance at this time. Still needs +2 assist for mobility. Pt motivated to participate and able to perform there-ex with resistance added. Pt refused to sit in recliner this date secondary to pain. RN notified and will bring pain meds.  Follow Up Recommendations  SNF     Equipment Recommendations       Recommendations for Other Services       Precautions / Restrictions Precautions Precautions: Fall Restrictions Weight Bearing Restrictions: No    Mobility  Bed Mobility Overal bed mobility: Needs Assistance;+2 for physical assistance Bed Mobility: Supine to Sit     Supine to sit: Min assist     General bed mobility comments: assist for bed mobility. Assist for sliding B LE to EOB. Once seated at EOB, able to sit with supervision  Transfers Overall transfer level: Needs assistance Equipment used: Rolling walker (2 wheeled) Transfers: Sit to/from Stand Sit to Stand: Mod assist;+2 physical assistance         General  transfer comment: +2 assist and RW and mod assist. Bed elevated for ease of transfer. Once standing, pt able to stand with safe technique. No buckling noted  Ambulation/Gait Ambulation/Gait assistance: Min assist;+2 physical assistance Ambulation Distance (Feet): 5 Feet Assistive device: Rolling walker (2 wheeled) Gait Pattern/deviations: Step-to pattern     General Gait Details: Pt ambulates in room with +2 assist for balance. Pt complains of increased pain with mobility. Pt unable to further tolerate ambulation distance this date.   Stairs            Wheelchair Mobility    Modified Rankin (Stroke Patients Only)       Balance                                    Cognition Arousal/Alertness: Awake/alert Behavior During Therapy: WFL for tasks assessed/performed Overall Cognitive Status: Within Functional Limits for tasks assessed                      Exercises Other Exercises Other Exercises: Supine ther-ex performed including B LE heel slides with resistance and SLRs x 12 reps. Pt performed ther-ex with slow technique and requires heavy cues for sequencing.    General Comments        Pertinent Vitals/Pain Pain Assessment: Faces Faces Pain Scale: Hurts little more Pain Location: B calf muscles Pain Descriptors / Indicators: Cramping Pain Intervention(s): Limited activity within patient's tolerance;Patient requesting pain meds-RN notified  Home Living                      Prior Function            PT Goals (current goals can now be found in the care plan section) Acute Rehab PT Goals Patient Stated Goal: to return to PLOF PT Goal Formulation: With patient Time For Goal Achievement: 11/30/15 Potential to Achieve Goals: Good Progress towards PT goals: Progressing toward goals    Frequency  Min 2X/week    PT Plan Current plan remains appropriate    Co-evaluation             End of Session Equipment Utilized  During Treatment: Gait belt Activity Tolerance: Patient tolerated treatment well Patient left: in bed;with bed alarm set     Time: 1610-1633 PT Time Calculation (min) (ACUTE ONLY): 23 min  Charges:  $Gait Training: 8-22 mins $Therapeutic Exercise: 8-22 mins                    G Codes:      Jamie Bennett 12/01/15, 4:51 PM  Greggory Stallion, PT, DPT [phone]

## 2015-11-17 NOTE — Clinical Social Work Placement (Signed)
   CLINICAL SOCIAL WORK PLACEMENT  NOTE  Date:  11/17/2015  Patient Details  Name: Jamie Bennett MRN: [id number] Date of Birth: [birth year]  Clinical Social Work is seeking post-discharge placement for this patient at the [place] level of care (*CSW will initial, date and re-position this form in  chart as items are completed):  Yes   Patient/family provided with [place] Work Department's list of facilities offering this level of care within the geographic area requested by the patient (or if unable, by the patient's family).  Yes   Patient/family informed of their freedom to choose among providers that offer the needed level of care, that participate in Medicare, Medicaid or managed care program needed by the patient, have an available bed and are willing to accept the patient.  Yes   Patient/family informed of [facility]'s ownership interest in [facility] and [facility], as well as of the fact that they are under no obligation to receive care at these facilities.  PASRR submitted to EDS on 11/16/15     PASRR number received on 11/17/15 ([id number])     Existing PASRR number confirmed on       FL2 transmitted to all facilities in geographic area requested by pt/family on 11/16/15     FL2 transmitted to all facilities within larger geographic area on       Patient informed that his/her managed care company has contracts with or will negotiate with certain facilities, including the following:        Yes   Patient/family informed of bed offers received.  Patient chooses bed at  ([facility] )     Physician recommends and patient chooses bed at      Patient to be transferred to   on  .  Patient to be transferred to facility by       Patient family notified on   of transfer.  Name of family member notified:        PHYSICIAN Please sign FL2     Additional Comment:    _______________________________________________ Loralyn Freshwater, LCSW 11/17/2015, 4:16 PM

## 2015-11-17 NOTE — Progress Notes (Addendum)
PASARR is pending. Clinical Education officer, museum (CSW) asked MD to sign 30 day note on chart. No SNF bed offers at this point. [facility] liaison is coming to meet with patient before making a bed offer. CSW met with patient and her husband at bedside and made them aware that [facility] is considering and [facility] will come to visit them. Patient is agreeable to receive a visit from [place]. CSW will continue to follow and assist as needed.   [facility] made a bed offer and patient accepted bed offer. Per [facility] he will start [place] authorization today.   Blima Rich, LCSW [phone]

## 2015-11-17 NOTE — Progress Notes (Cosign Needed)
Speech Language Pathology Treatment: Dysphagia  Patient Details Name: Jamie Bennett MRN: [id number] DOB: [birth year] Today's Date: 11/17/2015 Time: 0900-0930 SLP Time Calculation (min) (ACUTE ONLY): 30 min  Assessment / Plan / Recommendation Clinical Impression  Pt appears at reduced risk for aspiration at this time if following general aspiration precautions w/ po's. Pt and husband seen this am for trials to continue to upgrade diet as per poc; pt presented w/ increased alertness this am - the issue of pt's drowsiness has been discussed w/ pt/Husband as increased drowsiness from any sedating pain medication could increase risk for aspiration w/ any oral intake. Pt was positioned well in bed and eating her breakfast meal this morning, drinking thin liquids via straw/cup. Pt stated she was using small, single sips and recalled importance of this and other aspiration precautions. No overt s/s of aspiration reported by pt/Husband. Pt swallowed pills Whole w/ applesauce w/ no deficits noted as well. Discussed importance of swallowing pills w/ a puree NOT thin liquids at this time d/t the increased risk for aspiration w/ thin liquids especially when drowsy. Pt agreed; NSG aware. Continued education on posted aspiration precautions and strategy of single, small sips. Educated husband and pt on strategies to reduce exertion during meals, need for rest breaks during meals to avoid fatigue and SOB. Education given on s/s of dysphagia as well as pt's risk for dysphagia and aspiration at this time d/t illness as well as from reflux/regurgitation d/t GI baseline status. Discussed poc including upgrading pt's diet to a Dys. 3 w/ thin liquids; will be abel to resume a regular diet as tolerates as medical status continues to improve overall and pt returns home. Dietician following for dietary supplement. NSG updated. ST services will follow for any further education as indicated.     HPI HPI: Pt is a 55 y.o.  female w/ h/o Gastric Bypass surgery 15+ yrs ago, Bipolar, Dis., Constipation, anxiety, RA, OA, Fibromyalgia, thyroid dis., reflux, and other medical issues who presents with altered mental status from Drug Overdose per MD. Patient was unable to contribute to her history, and collateral information is taken from her husband who is present at bedside. He states that she has bipolar, with depressive episodes. She has a prescription for Provigil, and he feels that she likely can overdose of this medication. She has done this before. He states that he controls her medications and typically keeps that all locked up, but that she sometimes will still pick it up from the pharmacy. On evaluation in the ED she was responsive, but not in any coherent way. She wass also found to have acute kidney injury, as well as significantly elevated liver enzymes. Her blood pressure was also trending down. Pt was orally intubated for ~3 days, extubated. She is more awake/alert than at other sessions but is not taking as much pain medication at this time. Vocal quality c/b min. gravely hoarseness suspect d/t recent intubation. She is tolerating the diet upgrade since yesterday afternoon w/ no overt s/s of aspiration reported by NSG staff or pt/husband. Noted pt using straws when drinking although this has not bee recommended, especially if any coughing is noted. Pt stated she helped to feed herself much of her breakfast this AM.      SLP Plan  Continue with current plan of care     Recommendations  Diet recommendations: Dysphagia 3 (mechanical soft);Thin liquid Liquids provided via: Cup Medication Administration: Whole meds with puree Supervision: Patient able to self feed;Staff to assist  with self feeding;Intermittent supervision to cue for compensatory strategies Compensations: Minimize environmental distractions;Slow rate;Small sips/bites;Follow solids with liquid Postural Changes and/or Swallow Maneuvers: Seated upright 90  degrees;Upright 30-60 min after meal (gastric bypass surgery)             General recommendations:  (Dietician f/u) Oral Care Recommendations: Oral care BID;Staff/trained caregiver to provide oral care Follow up Recommendations: Skilled Nursing facility Plan: Continue with current plan of care     Jamie, Bennett, CCC-SLP  Prakriti Carignan 11/17/2015, 10:49 AM

## 2015-11-18 LAB — HEPATIC FUNCTION PANEL
ALT: 60 U/L — ABNORMAL HIGH (ref 14–54)
AST: 35 U/L (ref 15–41)
Albumin: 2.3 g/dL — ABNORMAL LOW (ref 3.5–5.0)
Alkaline Phosphatase: 132 U/L — ABNORMAL HIGH (ref 38–126)
Bilirubin, Direct: 0.2 mg/dL (ref 0.1–0.5)
Indirect Bilirubin: 0.4 mg/dL (ref 0.3–0.9)
Total Bilirubin: 0.6 mg/dL (ref 0.3–1.2)
Total Protein: 5.4 g/dL — ABNORMAL LOW (ref 6.5–8.1)

## 2015-11-18 LAB — BASIC METABOLIC PANEL
Anion gap: 5 (ref 5–15)
BUN: 12 mg/dL (ref 6–20)
CO2: 27 mmol/L (ref 22–32)
Calcium: 8.4 mg/dL — ABNORMAL LOW (ref 8.9–10.3)
Chloride: 110 mmol/L (ref 101–111)
Creatinine, Ser: 0.68 mg/dL (ref 0.44–1.00)
GFR calc Af Amer: >60 mL/min (ref >60)
GFR calc non Af Amer: >60 mL/min (ref >60)
Glucose, Bld: 87 mg/dL (ref 65–99)
Potassium: 3.2 mmol/L — ABNORMAL LOW (ref 3.5–5.1)
Sodium: 142 mmol/L (ref 135–145)

## 2015-11-18 LAB — MAGNESIUM: Magnesium: 1.3 mg/dL — ABNORMAL LOW (ref 1.7–2.4)

## 2015-11-18 MED ORDER — MAGNESIUM SULFATE 4 GM/100ML IV SOLN
4.0000 g | Freq: Once | INTRAVENOUS | Status: AC
  Administered 2015-11-18: 4 g via INTRAVENOUS
  Filled 2015-11-18: qty 100

## 2015-11-18 MED ORDER — ENOXAPARIN SODIUM 40 MG/0.4ML ~~LOC~~ SOLN
40.0000 mg | Freq: Two times a day (BID) | SUBCUTANEOUS | Status: DC
  Administered 2015-11-18 (×2): 40 mg via SUBCUTANEOUS
  Filled 2015-11-18 (×2): qty 0.4

## 2015-11-18 MED ORDER — POTASSIUM CHLORIDE CRYS ER 20 MEQ PO TBCR
40.0000 meq | EXTENDED_RELEASE_TABLET | Freq: Once | ORAL | Status: AC
  Administered 2015-11-18: 40 meq via ORAL
  Filled 2015-11-18: qty 2

## 2015-11-18 NOTE — Progress Notes (Signed)
Order for enoxaparin 40 mg subcutaneously daily changed to q12h per anticoagulation protocol for CrCl > 30 mL/min and BMI >40.   Lenis Noon, PharmD Clinical Pharmacist 8:11 AM

## 2015-11-18 NOTE — Progress Notes (Signed)
Clinical Education officer, museum (CSW) faxed most recent PT note to [facility]. Per [facility] authorization is still pending. PASARR has been received. CSW will continue to follow and assist as needed.   Blima Rich, LCSW [phone]

## 2015-11-18 NOTE — Consult Note (Signed)
[facility] Face-to-Face Psychiatry Consult   Reason for Consult:  Consult for this 55 year old woman with a history of mood instability is recovering from an overdose of unknown substances Referring Physician:  Anselm Jungling Patient Identification: KARAH CARUTHERS MRN:  [id number] Principal Diagnosis: Bipolar 1 disorder, depressed ([place]) Diagnosis:   Patient Active Problem List   Diagnosis Date Noted  . AKI (acute kidney injury) ([place]) [N17.9] 11/09/2015  . Elevated troponin [R79.89] 11/09/2015  . Hypotension [I95.9] 11/09/2015  . Respiratory failure ([place]) [J96.90]   . Acute hepatic failure [K72.00] 11/08/2015  . Drug overdose [T50.901A] 11/08/2015  . Fatty infiltration of liver [K76.0] 02/16/2015  . Hepatic fibrosis ([place]) [K74.0] 02/16/2015  . Abnormal serum level of alkaline phosphatase [R74.8] 02/15/2015  . Anemia [D64.9] 02/05/2015  . Vitamin B 12 deficiency [E53.8] 02/05/2015  . OP (osteoporosis) [M81.0] 06/16/2014  . Rheumatoid arteritis [I00] 03/02/2014  . Osteoarthritis of left hip [M16.12] 03/02/2014  . Hypothyroidism [E03.9] 03/02/2014  . Rheumatic fever without heart involvement [I00] 03/02/2014  . Adult hypothyroidism [E03.9] 03/02/2014  . Arthritis of pelvic region, degenerative [M16.9] 03/02/2014  . Bipolar 1 disorder, depressed ([place]) [F31.9] 02/26/2014  . Bariatric surgery status [Z98.84] 11/24/2013  . Affective bipolar disorder ([place]) [F31.9] 11/24/2013  . Bipolar affective disorder ([place]) [F31.9] 11/24/2013  . Rheumatoid arthritis ([place]) [M06.9] 11/24/2013  . Polysubstance (excluding opioids) dependence ([place]) [F19.20] 09/11/2013  . Polysubstance dependence ([place]) [F19.20] 09/11/2013  . Combined drug dependence excluding opioids ([place]) [F19.20] 09/11/2013  . Arthritis or polyarthritis, rheumatoid ([place]) [M06.9] 09/05/2013  . Leg weakness [R29.898] 09/05/2013    Total Time spent with patient: 1 hour  Subjective:   LOLAH COGHLAN is a 55 y.o. female patient admitted with  "I'm feeling better now.".  Follow-up note Wednesday the 20th. Patient has no new complaints. Reports that her mood is fairly good. Patient was smiling and interactive. She says that physical therapy did not come by today and so she did not get out of bed. She says she is eating well with no difficulty. No mental health or psychiatric complaints.  HPI:  Patient interviewed. Husband was present as well. Chart reviewed including notes throughout this hospitalization as well as prior psychiatric history. Labs and vitals reviewed. 55 year old woman presented to the hospital a little over a week ago with acute mental status changes, confusion. Initial unresponsiveness low blood pressure. Vital signs were low and she was having renal and hepatic shutdown. He has been in the critical care unit for several days and is now finally out on the floor. Patient was receiving dialysis briefly but that seems to resolve. Labs are all improving. From the beginning. The husband has insisted that this is the result of an overdose of Provigil. On interview today, the patient seems unclear about it and apparently is just answering questions based on what her husband has told her. She says that she remembers having been depressed. She seems a little vague in her supposedly memories of taking excessive amounts of Provigil. Tends to answer questions by saying "it must of been quite a bit". She denies that she had been abusing any of her other drugs which the husband also insists on. Reports that she had been depressed, but they both seemed insistent that she couldn't have been trying to kill herself. Patient denies any thoughts about wanting to hurt or kill herself now. Says that her mood is feeling much better. Not feeling acutely depressed now. Denies having any current hallucinations. Says she had some transiently while she  was in the critical care unit, but that has all resolved. She had not been drinking or using any other  substances. She gets her outpatient psychiatric care through doctors in the [facility] clinic. She had been seeing Dr. Jimmye Norman, until he left, but has not seen a psychiatrist since January. He is scheduled to see the replacement. Dr. sometime soon. Doesn't report any particular recent stressor.  Social history: Patient lives with her husband. Husband appears to be very supportive of her. She is not working outside the home.  Medical history: Extensive medical history including history of rheumatoid arthritis, hypothyroidism, history of chronic anemia, probably iron deficiency, history of arthritis. Surgery.  Substance abuse history: Looking back over her old psychiatry notes it's clear that she has a history of pretty significant substance abuse. This was really the major issue that off and let her to psychiatric attention in past years and as recently as a little over a year ago. Has a history of drinking abusively, as well as abusing prescriptions benzodiazepines.  Past Psychiatric History: Patient has a history of mood instability. Had been variously diagnosed as depressed, bipolar, and having borderline personality disorder. As near as I can tell, she really doesn't have a history of mania, especially not psychotic mania. As noted previously. A lot of her emergency presentations of also involve substance abuse. She does have a past history of suicide attempts and agitated behavior. Has had more than one hospitalization in the past, probably not a little over a year.  Risk to Self: Is patient at risk for suicide?: No Risk to Others:   Prior Inpatient Therapy:   Prior Outpatient Therapy:    Past Medical History:  Past Medical History  Diagnosis Date  . Osteoarthritis   . Osteoporosis   . Rheumatoid arthritis ([place])   . Thyroid disease   . Hypotension   . Constipation   . Anemia   . Bipolar 1 disorder ([place])   . Anxiety   . Fibromyalgia     Past Surgical History  Procedure  Laterality Date  . Gastric bypass    . Cholecystectomy     Family History:  Family History  Problem Relation Age of Onset  . Depression Mother   . Dementia Mother   . Heart disease Mother   . Parkinson's disease Father    Family Psychiatric  History: Patient is not aware of any significant family history of mental illness Social History:  History  Alcohol Use No     History  Drug Use No    Social History   Social History  . Marital Status: Married    Spouse Name: N/A  . Number of Children: N/A  . Years of Education: N/A   Social History Main Topics  . Smoking status: Never Smoker   . Smokeless tobacco: None  . Alcohol Use: No  . Drug Use: No  . Sexual Activity: Yes   Other Topics Concern  . None   Social History Narrative   Additional Social History:    Allergies:  No Known Allergies  Labs:  Results for orders placed or performed during the hospital encounter of 11/08/15 (from the past 48 hour(s))  Magnesium     Status: Abnormal   Collection Time: 11/17/15  6:01 AM  Result Value Ref Range   Magnesium 1.2 (L) 1.7 - 2.4 mg/dL  Basic metabolic panel     Status: Abnormal   Collection Time: 11/17/15  6:01 AM  Result Value Ref Range  Sodium 142 135 - 145 mmol/L   Potassium 4.8 3.5 - 5.1 mmol/L    Comment: HEMOLYSIS AT THIS LEVEL MAY AFFECT RESULT   Chloride 110 101 - 111 mmol/L   CO2 23 22 - 32 mmol/L   Glucose, Bld 78 65 - 99 mg/dL   BUN 23 (H) 6 - 20 mg/dL   Creatinine, Ser 0.91 0.44 - 1.00 mg/dL   Calcium 8.3 (L) 8.9 - 10.3 mg/dL   GFR calc non Af Amer >60 >60 mL/min   GFR calc Af Amer >60 >60 mL/min    Comment: (NOTE) The eGFR has been calculated using the CKD EPI equation. This calculation has not been validated in all clinical situations. eGFR's persistently <60 mL/min signify possible Chronic Kidney Disease.    Anion gap 9 5 - 15  Magnesium     Status: Abnormal   Collection Time: 11/18/15  7:25 AM  Result Value Ref Range   Magnesium 1.3 (L)  1.7 - 2.4 mg/dL  Basic metabolic panel     Status: Abnormal   Collection Time: 11/18/15  7:25 AM  Result Value Ref Range   Sodium 142 135 - 145 mmol/L   Potassium 3.2 (L) 3.5 - 5.1 mmol/L   Chloride 110 101 - 111 mmol/L   CO2 27 22 - 32 mmol/L   Glucose, Bld 87 65 - 99 mg/dL   BUN 12 6 - 20 mg/dL   Creatinine, Ser 0.68 0.44 - 1.00 mg/dL   Calcium 8.4 (L) 8.9 - 10.3 mg/dL   GFR calc non Af Amer >60 >60 mL/min   GFR calc Af Amer >60 >60 mL/min    Comment: (NOTE) The eGFR has been calculated using the CKD EPI equation. This calculation has not been validated in all clinical situations. eGFR's persistently <60 mL/min signify possible Chronic Kidney Disease.    Anion gap 5 5 - 15  Hepatic function panel     Status: Abnormal   Collection Time: 11/18/15  7:25 AM  Result Value Ref Range   Total Protein 5.4 (L) 6.5 - 8.1 g/dL   Albumin 2.3 (L) 3.5 - 5.0 g/dL   AST 35 15 - 41 U/L   ALT 60 (H) 14 - 54 U/L   Alkaline Phosphatase 132 (H) 38 - 126 U/L   Total Bilirubin 0.6 0.3 - 1.2 mg/dL   Bilirubin, Direct 0.2 0.1 - 0.5 mg/dL   Indirect Bilirubin 0.4 0.3 - 0.9 mg/dL    Current Facility-Administered Medications  Medication Dose Route Frequency Provider Last Rate Last Dose  . alum & mag hydroxide-simeth (MAALOX/MYLANTA) 200-200-20 MG/5ML suspension 15 mL  15 mL Oral Q4H PRN Mikael Spray, NP      . bisacodyl (DULCOLAX) suppository 10 mg  10 mg Rectal Daily PRN Wilhelmina Mcardle, MD   10 mg at 11/12/15 0953  . busPIRone (BUSPAR) tablet 10 mg  10 mg Oral TID Laverle Hobby, MD   10 mg at 11/18/15 1818  . enoxaparin (LOVENOX) injection 40 mg  40 mg Subcutaneous Q12H Darylene Price Zearing, RPH   40 mg at 11/18/15 0845  . guaiFENesin (MUCINEX) 12 hr tablet 600 mg  600 mg Oral BID PRN Wilhelmina Mcardle, MD      . ipratropium-albuterol (DUONEB) 0.5-2.5 (3) MG/3ML nebulizer solution 3 mL  3 mL Nebulization Q4H PRN Laverle Hobby, MD   3 mL at 11/13/15 1311  . lamoTRIgine (LAMICTAL) tablet  150 mg  150 mg Oral BID Laverle Hobby, MD   150 mg  at 11/18/15 6811  . levothyroxine (SYNTHROID, LEVOTHROID) tablet 100 mcg  100 mcg Oral QAC breakfast Wilhelmina Mcardle, MD   100 mcg at 11/18/15 [phone]  . metoprolol (LOPRESSOR) injection 2.5-5 mg  2.5-5 mg Intravenous Q3H PRN Wilhelmina Mcardle, MD      . ondansetron [facility]) injection 4 mg  4 mg Intravenous Q6H PRN [name], MD      . oxyCODONE (Oxy IR/ROXICODONE) immediate release tablet 5 mg  5 mg Oral Q4H PRN [name], MD   5 mg at 11/18/15 1819  . pantoprazole (PROTONIX) EC tablet 40 mg  40 mg Oral Daily [name], MD   40 mg at 11/18/15 2035  . sodium chloride flush (NS) 0.9 % injection 10-40 mL  10-40 mL Intracatheter Q12H Flora Lipps, MD   10 mL at 11/15/15 0929  . sodium chloride flush (NS) 0.9 % injection 10-40 mL  10-40 mL Intracatheter PRN Flora Lipps, MD      . sodium chloride flush (NS) 0.9 % injection 3 mL  3 mL Intravenous Q12H Lance Coon, MD   3 mL at 11/17/15 0934  . venlafaxine (EFFEXOR) tablet 50 mg  50 mg Oral BID WC Flora Lipps, MD   50 mg at 11/18/15 5974    Musculoskeletal: Strength & Muscle Tone: decreased Gait & Station: unable to stand Patient leans: N/A  Psychiatric Specialty Exam: Review of Systems  HENT: Negative.   Eyes: Negative.   Respiratory: Negative.   Cardiovascular: Negative.   Gastrointestinal: Negative.   Musculoskeletal: Positive for myalgias.  Skin: Negative.   Neurological: Positive for weakness.  Psychiatric/Behavioral: Positive for memory loss. Negative for depression, suicidal ideas, hallucinations and substance abuse. The patient is not nervous/anxious and does not have insomnia.     Blood pressure 126/67, pulse 90, temperature 98.6 F (37 C), temperature source Oral, resp. rate 20, height '5\' 6"'  (1.676 m), weight 126.1 kg (278 lb), SpO2 95 %.Body mass index is 44.89 kg/(m^2).  General Appearance: Casual  Eye Contact::  Fair  Speech:  Slow   Volume:  Decreased  Mood:  Euthymic  Affect:  Appropriate  Thought Process:  Goal Directed  Orientation:  Full (Time, Place, and Person)  Thought Content:  Negative  Suicidal Thoughts:  No  Homicidal Thoughts:  No  Memory:  Immediate;   Good Recent;   Fair Remote;   Poor  Judgement:  Fair  Insight:  Lacking  Psychomotor Activity:  Decreased  Concentration:  Fair  Recall:  [employer] of Knowledge:Fair  Language: Fair  Akathisia:  No  Handed:  Right  AIMS (if indicated):     Assets:  Financial Resources/Insurance Housing Resilience Social Support  ADL's:  Impaired  Cognition: Impaired,  Mild  Sleep:      Treatment Plan Summary: Daily contact with patient to assess and evaluate symptoms and progress in treatment, Medication management and Plan Patient appears to be psychiatrically stable and ready to go to rehabilitation. No indication to make any changes to medication. Supportive therapy continued today. I will sign off after today as there is no need to make any changes to current treatment plan.  Disposition: Patient does not meet criteria for psychiatric inpatient admission. Supportive therapy provided about ongoing stressors.  Alethia Berthold, MD 11/18/2015 8:10 PM

## 2015-11-18 NOTE — Progress Notes (Addendum)
[name] [facility] [place] NAME: Jamie Bennett    MRN#:  [id number]  DATE OF BIRTH:  [birth year]  SUBJECTIVE:  Hospital Day: 10 days Jamie Bennett is a 55 y.o. female presenting with Drug Overdose .   No complaint except generalized weakness.  REVIEW OF SYSTEMS:  CONSTITUTIONAL: No fever, positive fatigue or weakness.  EYES: No blurred or double vision.  EARS, NOSE, AND THROAT: No tinnitus or ear pain.  RESPIRATORY: No cough, shortness of breath, wheezing or hemoptysis.  CARDIOVASCULAR: No chest pain, orthopnea, edema.  GASTROINTESTINAL: No nausea, vomiting, diarrhea or abdominal pain.  GENITOURINARY: No dysuria, hematuria.  ENDOCRINE: No polyuria, nocturia,  HEMATOLOGY: No anemia, easy bruising or bleeding SKIN: No rash or lesion. MUSCULOSKELETAL: No joint pain or arthritis.   NEUROLOGIC: No tingling, numbness, weakness.  PSYCHIATRY: No anxiety or depression.   DRUG ALLERGIES:  No Known Allergies  VITALS:  Blood pressure 153/83, pulse 93, temperature 98.4 F (36.9 C), temperature source Oral, resp. rate 20, height 5\' 6"  (1.676 m), weight 126.1 kg (278 lb), SpO2 98 %.  PHYSICAL EXAMINATION:  VITAL SIGNS: Filed Vitals:   11/18/15 0502 11/18/15 0805  BP: 137/84 153/83  Pulse: 89 93  Temp: 98.2 F (36.8 C) 98.4 F (36.9 C)  Resp: 18 20   GENERAL:54 y.o.female currently in no acute distress.  HEAD: Normocephalic, atraumatic.  EYES: Pupils equal, round, reactive to light. Extraocular muscles intact. No scleral icterus.  MOUTH: Moist mucosal membrane. Dentition intact. No abscess noted.  EAR, NOSE, THROAT: Clear without exudates. No external lesions.  NECK: Supple. No thyromegaly. No nodules. No JVD.  PULMONARY: Clear to ascultation, without wheeze rails or rhonci. No use of accessory muscles, Good respiratory effort. good air entry bilaterally CHEST: Nontender to palpation.  CARDIOVASCULAR: S1 and S2. Regular rate  and rhythm. No murmurs, rubs, or gallops. No edema. Pedal pulses 2+ bilaterally.  GASTROINTESTINAL: Soft, nontender, nondistended. No masses. Positive bowel sounds. No hepatosplenomegaly.  MUSCULOSKELETAL: No swelling, clubbing, or edema. Range of motion full in all extremities.  NEUROLOGIC: Cranial nerves II through XII are intact. No gross focal neurological deficits. Sensation intact. Reflexes intact.  SKIN: No ulceration, lesions, rashes, or cyanosis. Skin warm and dry. Turgor intact.  PSYCHIATRIC: Mood, affect flat. The patient is awake, alert and oriented x 3. Insight, judgment intact. Speech pattern is very slow but appropriate     LABORATORY PANEL:   CBC  Recent Labs Lab 11/16/15 0726  WBC 8.9  HGB 9.2*  HCT 27.1*  PLT 103*   ------------------------------------------------------------------------------------------------------------------  Chemistries   Recent Labs Lab 11/18/15 0725  NA 142  K 3.2*  CL 110  CO2 27  GLUCOSE 87  BUN 12  CREATININE 0.68  CALCIUM 8.4*  MG 1.3*  AST 35  ALT 60*  ALKPHOS 132*  BILITOT 0.6   ------------------------------------------------------------------------------------------------------------------  Cardiac Enzymes No results for input(s): TROPONINI in the last 168 hours. ------------------------------------------------------------------------------------------------------------------  RADIOLOGY:  No results found.  EKG:   Orders placed or performed during the hospital encounter of 11/08/15  . ED EKG  . EKG 12-Lead  . EKG 12-Lead  . ED EKG  . EKG 12-Lead  . EKG 12-Lead    ASSESSMENT AND PLAN:   Jamie Bennett is a 55 y.o. female presenting with Drug Overdose . Admitted 11/08/2015 : Day #: 10 days 1. Metabolic encephalopathy secondary to potential overdose. Improved.   Psych seen her, and suggested no acute need for inpatient psych admission.  2. Acute renal failure: Nephrology following , iniotially  required hemodialysis.  Removed femoral catheter, No acute indication for dialysis.   3. MSSA pneumonia continue oxygen and breathing treatments ceftriaxone day #7/7, give total 7 days. 4. Klebsiella urinary tract infection: Ceftriaxone total for 7 days. 5. Venous thromboembolism prophylactic: Heparin 6. Generalized weakness  need rehab placement. Social worker is working on Biochemist, clinical. 7. Acute hepatic failure- on admission- duet o drug overdose, improved now.  Hypokalemia. Po KCl. F/u BMP. Hypomagnesemia. Mag iv, f/u level.  All the records are reviewed and case discussed with Care Management/Social Workerr. Management plans discussed with the patient, her husband and they are in agreement.  CODE STATUS: full TOTAL TIME TAKING CARE OF THIS PATIENT: 32 minutes.   POSSIBLE D/C IN 2-3DAYS, DEPENDING ON CLINICAL CONDITION. Discussed with her husband in room today.  Demetrios Loll M.D on 11/18/2015 at 1:19 PM  Between 7am to 6pm - Pager - [phone]  After 6pm: House Pager: - [facility]  Office  [phone]  CC: Primary care physician; Lavera Guise, MD

## 2015-11-18 NOTE — Discharge Instructions (Signed)
Heart healthy diet. °Activity as tolerated. °Fall precaution. °

## 2015-11-19 DIAGNOSIS — R1312 Dysphagia, oropharyngeal phase: Secondary | ICD-10-CM | POA: Diagnosis not present

## 2015-11-19 DIAGNOSIS — M6281 Muscle weakness (generalized): Secondary | ICD-10-CM | POA: Diagnosis not present

## 2015-11-19 DIAGNOSIS — M159 Polyosteoarthritis, unspecified: Secondary | ICD-10-CM | POA: Diagnosis not present

## 2015-11-19 DIAGNOSIS — K219 Gastro-esophageal reflux disease without esophagitis: Secondary | ICD-10-CM | POA: Diagnosis not present

## 2015-11-19 DIAGNOSIS — M62838 Other muscle spasm: Secondary | ICD-10-CM | POA: Diagnosis not present

## 2015-11-19 DIAGNOSIS — R69 Illness, unspecified: Secondary | ICD-10-CM | POA: Diagnosis not present

## 2015-11-19 DIAGNOSIS — D649 Anemia, unspecified: Secondary | ICD-10-CM | POA: Diagnosis not present

## 2015-11-19 DIAGNOSIS — R531 Weakness: Secondary | ICD-10-CM | POA: Diagnosis not present

## 2015-11-19 DIAGNOSIS — M069 Rheumatoid arthritis, unspecified: Secondary | ICD-10-CM | POA: Diagnosis not present

## 2015-11-19 DIAGNOSIS — K210 Gastro-esophageal reflux disease with esophagitis: Secondary | ICD-10-CM | POA: Diagnosis not present

## 2015-11-19 DIAGNOSIS — N189 Chronic kidney disease, unspecified: Secondary | ICD-10-CM | POA: Diagnosis not present

## 2015-11-19 DIAGNOSIS — R4182 Altered mental status, unspecified: Secondary | ICD-10-CM | POA: Diagnosis not present

## 2015-11-19 DIAGNOSIS — R262 Difficulty in walking, not elsewhere classified: Secondary | ICD-10-CM | POA: Diagnosis not present

## 2015-11-19 DIAGNOSIS — E039 Hypothyroidism, unspecified: Secondary | ICD-10-CM | POA: Diagnosis not present

## 2015-11-19 DIAGNOSIS — I10 Essential (primary) hypertension: Secondary | ICD-10-CM | POA: Diagnosis not present

## 2015-11-19 DIAGNOSIS — Z5189 Encounter for other specified aftercare: Secondary | ICD-10-CM | POA: Diagnosis not present

## 2015-11-19 LAB — BASIC METABOLIC PANEL
Anion gap: 5 (ref 5–15)
BUN: 9 mg/dL (ref 6–20)
CO2: 27 mmol/L (ref 22–32)
Calcium: 8.1 mg/dL — ABNORMAL LOW (ref 8.9–10.3)
Chloride: 110 mmol/L (ref 101–111)
Creatinine, Ser: 0.62 mg/dL (ref 0.44–1.00)
GFR calc Af Amer: >60 mL/min (ref >60)
GFR calc non Af Amer: >60 mL/min (ref >60)
Glucose, Bld: 101 mg/dL — ABNORMAL HIGH (ref 65–99)
Potassium: 3.9 mmol/L (ref 3.5–5.1)
Sodium: 142 mmol/L (ref 135–145)

## 2015-11-19 LAB — MAGNESIUM: Magnesium: 1.7 mg/dL (ref 1.7–2.4)

## 2015-11-19 NOTE — Progress Notes (Signed)
Patient is medically stable for D/C to [facility] today. Per [facility] authorization has been received. Per [place] patient will go to room 507. RN will call report at [phone] and arrange EMS for transport. Clinical Education officer, museum (CSW) sent D/C Summary, FL2 and D/C Packet to [employer] via [employer]. Patient is aware of above. Patient's husband is at bedside and aware of above. Please reconsult if future social work needs arise. CSW signing off.   Blima Rich, LCSW [phone]

## 2015-11-19 NOTE — Progress Notes (Signed)
Pt had soft formed bm.  Not diahrrhea .  Pt ready for d/c. Report called to [facility]. Pt to transport via ems.

## 2015-11-19 NOTE — Discharge Summary (Signed)
[facility] [place] NAME: Jamie Bennett    MR#:  [id number]  DATE OF BIRTH:  24-Feb-1961  DATE OF ADMISSION:  11/08/2015 ADMITTING PHYSICIAN: No admitting provider for patient encounter.  DATE OF DISCHARGE: 11/19/2015 PRIMARY CARE PHYSICIAN: Lavera Guise, MD    ADMISSION DIAGNOSIS:  Transaminitis [R74.0] Overdose, undetermined intent, initial encounter [T50.904A] Altered mental status, unspecified altered mental status type [R41.82]   DISCHARGE DIAGNOSIS:  Metabolic encephalopathy secondary to potential overdose Acute renal failure MSSA pneumonia Klebsiella urinary tract infection Acute hepatic failure SECONDARY DIAGNOSIS:   Past Medical History  Diagnosis Date  . Osteoarthritis   . Osteoporosis   . Rheumatoid arthritis ([place])   . Thyroid disease   . Hypotension   . Constipation   . Anemia   . Bipolar 1 disorder ([place])   . Anxiety   . Fibromyalgia     HOSPITAL COURSE:  Jamie Bennett is a 55 y.o. female presenting with Drug Overdose . Admitted 11/08/2015 : Day #: 11 days 1. Metabolic encephalopathy secondary to potential overdose. Improved.  Psych seen her, and suggested no acute need for inpatient psych admission.  2. Acute renal failure: Nephrology following , initially required hemodialysis.  Removed femoral catheter, No acute indication for dialysis.   3. MSSA pneumonia continue oxygen and breathing treatments ceftriaxone, finished total 7 days. 4. Klebsiella urinary tract infection: Ceftriaxone total for 7 days. 5. Venous thromboembolism prophylactic: Heparin 6. Generalized weakness need rehab placemen. 7. Acute hepatic failure- on admission- duet o drug overdose, improved now.  Hypokalemia. Improved with Po KCl.  Hypomagnesemia.  Improved with Mag iv.   DISCHARGE CONDITIONS:   Stable, discharge to SNF today.  CONSULTS OBTAINED:  Treatment Team:  Gonzella Lex, MD  DRUG ALLERGIES:  No  Known Allergies  DISCHARGE MEDICATIONS:   Current Discharge Medication List    CONTINUE these medications which have NOT CHANGED   Details  ALPRAZolam (XANAX) 1 MG tablet Take 1 tablet (1 mg total) by mouth 4 (four) times daily as needed. Qty: 360 tablet, Refills: 0    Biotin 5000 MCG TABS Take 5,000 mcg by mouth daily.     busPIRone (BUSPAR) 10 MG tablet Take 20 mg by mouth 4 (four) times daily.    Cholecalciferol (HM VITAMIN D3) 4000 units CAPS Take 4,000 capsules by mouth daily.    cyanocobalamin (,VITAMIN B-12,) 1000 MCG/ML injection Inject 1,000 mcg into the muscle every 30 (thirty) days.    esomeprazole (NEXIUM) 40 MG capsule Take 1 capsule (40 mg total) by mouth daily at 12 noon.    Fiber, Guar Gum, CHEW Chew 10 mg by mouth daily.    hydroxychloroquine (PLAQUENIL) 200 MG tablet Take 200 mg by mouth See admin instructions. 200 mg twice a day Mon-Friday only    lamoTRIgine (LAMICTAL) 150 MG tablet Take 150 tablets by mouth 2 (two) times daily.    levothyroxine (SYNTHROID, LEVOTHROID) 112 MCG tablet Take 112 mcg by mouth daily.    Multiple Vitamin (MULTIVITAMIN WITH MINERALS) TABS tablet Take 1 tablet by mouth daily.    tiZANidine (ZANAFLEX) 4 MG capsule Take 4 mg by mouth 2 (two) times daily as needed for muscle spasms.     traZODone (DESYREL) 50 MG tablet Take 1- 4 tablets nightly if needed for insomnia Qty: 360 tablet, Refills: 0    venlafaxine (EFFEXOR) 100 MG tablet Take 100 mg by mouth 3 (three) times daily.     zoledronic acid (RECLAST) 5 MG/100ML  SOLN injection Inject 5 mg into the vein See admin instructions. Patient takes yearly      STOP taking these medications     modafinil (PROVIGIL) 200 MG tablet          DISCHARGE INSTRUCTIONS:   If you experience worsening of your admission symptoms, develop shortness of breath, life threatening emergency, suicidal or homicidal thoughts you must seek medical attention immediately by calling 911 or calling your  MD immediately  if symptoms less severe.  You Must read complete instructions/literature along with all the possible adverse reactions/side effects for all the Medicines you take and that have been prescribed to you. Take any new Medicines after you have completely understood and accept all the possible adverse reactions/side effects.   Please note  You were cared for by a hospitalist during your hospital stay. If you have any questions about your discharge medications or the care you received while you were in the hospital after you are discharged, you can call the unit and asked to speak with the hospitalist on call if the hospitalist that took care of you is not available. Once you are discharged, your primary care physician will handle any further medical issues. Please note that NO REFILLS for any discharge medications will be authorized once you are discharged, as it is imperative that you return to your primary care physician (or establish a relationship with a primary care physician if you do not have one) for your aftercare needs so that they can reassess your need for medications and monitor your lab values.    Today   SUBJECTIVE   No complaint except diarrhea 4 times yesterday. No fever, nausea, vomiting or abdominal pain.   VITAL SIGNS:  Blood pressure 142/81, pulse 91, temperature 98 F (36.7 C), temperature source Oral, resp. rate 18, height 5\' 6"  (1.676 m), weight 126.1 kg (278 lb), SpO2 98 %.  I/O:   Intake/Output Summary (Last 24 hours) at 11/19/15 1106 Last data filed at 11/18/15 1700  Gross per 24 hour  Intake    480 ml  Output    900 ml  Net   -420 ml    PHYSICAL EXAMINATION:  GENERAL:  55 y.o.-year-old patient lying in the bed with no acute distress. Morbid obese. EYES: Pupils equal, round, reactive to light and accommodation. No scleral icterus. Extraocular muscles intact.  HEENT: Head atraumatic, normocephalic. Oropharynx and nasopharynx clear.  NECK:   Supple, no jugular venous distention. No thyroid enlargement, no tenderness.  LUNGS: Normal breath sounds bilaterally, no wheezing, rales,rhonchi or crepitation. No use of accessory muscles of respiration.  CARDIOVASCULAR: S1, S2 normal. No murmurs, rubs, or gallops.  ABDOMEN: Soft, non-tender, non-distended. Bowel sounds present. No organomegaly or mass.  EXTREMITIES: No pedal edema, cyanosis, or clubbing.  NEUROLOGIC: Cranial nerves II through XII are intact. Muscle strength 4/5 in all extremities. Sensation intact. Gait not checked.  PSYCHIATRIC: The patient is alert and oriented x 3.  SKIN: No obvious rash, lesion, or ulcer.   DATA REVIEW:   CBC  Recent Labs Lab 11/16/15 0726  WBC 8.9  HGB 9.2*  HCT 27.1*  PLT 103*    Chemistries   Recent Labs Lab 11/18/15 0725 11/19/15 0404  NA 142 142  K 3.2* 3.9  CL 110 110  CO2 27 27  GLUCOSE 87 101*  BUN 12 9  CREATININE 0.68 0.62  CALCIUM 8.4* 8.1*  MG 1.3* 1.7  AST 35  --   ALT 60*  --  ALKPHOS 132*  --   BILITOT 0.6  --     Cardiac Enzymes No results for input(s): TROPONINI in the last 168 hours.  Microbiology Results  Results for orders placed or performed during the hospital encounter of 11/08/15  MRSA PCR Screening     Status: None   Collection Time: 11/09/15  2:09 AM  Result Value Ref Range Status   MRSA by PCR NEGATIVE NEGATIVE Final    Comment:        The GeneXpert MRSA Assay (FDA approved for NASAL specimens only), is one component of a comprehensive MRSA colonization surveillance program. It is not intended to diagnose MRSA infection nor to guide or monitor treatment for MRSA infections.   Culture, expectorated sputum-assessment     Status: None   Collection Time: 11/10/15  8:30 AM  Result Value Ref Range Status   Specimen Description ENDOTRACHEAL  Final   Special Requests NONE  Final   Sputum evaluation THIS SPECIMEN IS ACCEPTABLE FOR SPUTUM CULTURE  Final   Report Status 11/10/2015 FINAL   Final  Culture, respiratory (NON-Expectorated)     Status: None   Collection Time: 11/10/15  8:30 AM  Result Value Ref Range Status   Specimen Description ENDOTRACHEAL  Final   Special Requests NONE Reflexed from [id number]  Final   Gram Stain   Final    FEW WBC SEEN MANY GRAM POSITIVE COCCI RARE YEAST    Culture HEAVY GROWTH STAPHYLOCOCCUS AUREUS  Final   Report Status 11/13/2015 FINAL  Final   Organism ID, Bacteria STAPHYLOCOCCUS AUREUS  Final      Susceptibility   Staphylococcus aureus - MIC*    CIPROFLOXACIN <=0.5 SENSITIVE Sensitive     ERYTHROMYCIN <=0.25 SENSITIVE Sensitive     GENTAMICIN <=0.5 SENSITIVE Sensitive     OXACILLIN <=0.25 SENSITIVE Sensitive     TETRACYCLINE <=1 SENSITIVE Sensitive     VANCOMYCIN <=0.5 SENSITIVE Sensitive     TRIMETH/SULFA <=10 SENSITIVE Sensitive     CLINDAMYCIN <=0.25 SENSITIVE Sensitive     RIFAMPIN <=0.5 SENSITIVE Sensitive     Inducible Clindamycin NEGATIVE Sensitive     * HEAVY GROWTH STAPHYLOCOCCUS AUREUS  CULTURE, BLOOD (ROUTINE X 2) w Reflex to PCR ID Panel     Status: None   Collection Time: 11/10/15  8:47 PM  Result Value Ref Range Status   Specimen Description BLOOD RIGHT HAND  Final   Special Requests BOTTLES DRAWN AEROBIC AND ANAEROBIC 5ML  Final   Culture  Setup Time   Final    GRAM POSITIVE COCCI ANAEROBIC BOTTLE ONLY CRITICAL RESULT CALLED TO, READ BACK BY AND VERIFIED WITH: Violeta Gelinas [place] AT 2040 11/11/15 MSS. Organism ID to follow    Culture   Final    COAGULASE NEGATIVE STAPHYLOCOCCUS ANAEROBIC BOTTLE ONLY THE SIGNIFICANCE OF ISOLATING THIS ORGANISM FROM A SINGLE VENIPUNCTURE CANNOT BE PREDICTED WITHOUT FURTHER CLINICAL AND CULTURE CORRELATION. SUSCEPTIBILITIES AVAILABLE ONLY ON REQUEST.    Report Status 11/15/2015 FINAL  Final  Blood Culture ID Panel (Reflexed)     Status: Abnormal   Collection Time: 11/10/15  8:47 PM  Result Value Ref Range Status   Enterococcus species NOT DETECTED NOT DETECTED Final   Vancomycin  resistance NOT DETECTED NOT DETECTED Final   Listeria monocytogenes NOT DETECTED NOT DETECTED Final   Staphylococcus species DETECTED (A) NOT DETECTED Final    Comment: CRITICAL RESULT CALLED TO, READ BACK BY AND VERIFIED WITH: JASON ROBBINS [place] AT 2040 11/11/15 MSS    Staphylococcus aureus  NOT DETECTED NOT DETECTED Final   Methicillin resistance DETECTED (A) NOT DETECTED Final    Comment: CRITICAL RESULT CALLED TO, READ BACK BY AND VERIFIED WITH: JASON ROBBINS [place] AT 2040 11/11/15 MSS    Streptococcus species NOT DETECTED NOT DETECTED Final   Streptococcus agalactiae NOT DETECTED NOT DETECTED Final   Streptococcus pneumoniae NOT DETECTED NOT DETECTED Final   Streptococcus pyogenes NOT DETECTED NOT DETECTED Final   Acinetobacter baumannii NOT DETECTED NOT DETECTED Final   Enterobacteriaceae species NOT DETECTED NOT DETECTED Final   Enterobacter cloacae complex NOT DETECTED NOT DETECTED Final   Escherichia coli NOT DETECTED NOT DETECTED Final   Klebsiella oxytoca NOT DETECTED NOT DETECTED Final   Klebsiella pneumoniae NOT DETECTED NOT DETECTED Final   Proteus species NOT DETECTED NOT DETECTED Final   Serratia marcescens NOT DETECTED NOT DETECTED Final   Carbapenem resistance NOT DETECTED NOT DETECTED Final   Haemophilus influenzae NOT DETECTED NOT DETECTED Final   Neisseria meningitidis NOT DETECTED NOT DETECTED Final   Pseudomonas aeruginosa NOT DETECTED NOT DETECTED Final   Candida albicans NOT DETECTED NOT DETECTED Final   Candida glabrata NOT DETECTED NOT DETECTED Final   Candida krusei NOT DETECTED NOT DETECTED Final   Candida parapsilosis NOT DETECTED NOT DETECTED Final   Candida tropicalis NOT DETECTED NOT DETECTED Final  CULTURE, BLOOD (ROUTINE X 2) w Reflex to PCR ID Panel     Status: None   Collection Time: 11/10/15  9:08 PM  Result Value Ref Range Status   Specimen Description BLOOD LEFT HAND  Final   Special Requests BOTTLES DRAWN AEROBIC AND ANAEROBIC 5ML  Final    Culture NO GROWTH 5 DAYS  Final   Report Status 11/15/2015 FINAL  Final  Urine culture     Status: Abnormal   Collection Time: 11/10/15 11:14 PM  Result Value Ref Range Status   Specimen Description URINE, RANDOM  Final   Special Requests NONE  Final   Culture >=100,000 COLONIES/mL KLEBSIELLA PNEUMONIAE (A)  Final   Report Status 11/13/2015 FINAL  Final   Organism ID, Bacteria KLEBSIELLA PNEUMONIAE (A)  Final      Susceptibility   Klebsiella pneumoniae - MIC*    AMPICILLIN >=32 RESISTANT Resistant     CEFAZOLIN <=4 SENSITIVE Sensitive     CEFTRIAXONE <=1 SENSITIVE Sensitive     CIPROFLOXACIN <=0.25 SENSITIVE Sensitive     GENTAMICIN <=1 SENSITIVE Sensitive     IMIPENEM <=0.25 SENSITIVE Sensitive     NITROFURANTOIN 64 INTERMEDIATE Intermediate     TRIMETH/SULFA <=20 SENSITIVE Sensitive     AMPICILLIN/SULBACTAM 4 SENSITIVE Sensitive     PIP/TAZO <=4 SENSITIVE Sensitive     Extended ESBL NEGATIVE Sensitive     * >=100,000 COLONIES/mL KLEBSIELLA PNEUMONIAE  Blood Culture ID Panel (Reflexed)     Status: None   Collection Time: 11/11/15  7:26 PM  Result Value Ref Range Status   Enterococcus species NOT DETECTED NOT DETECTED Corrected   Vancomycin resistance NOT DETECTED NOT DETECTED Corrected   Listeria monocytogenes NOT DETECTED NOT DETECTED Corrected   Staphylococcus species NOT DETECTED NOT DETECTED Corrected   Staphylococcus aureus NOT DETECTED NOT DETECTED Corrected    Comment: CORRECTED ON 04/14 AT 1004: PREVIOUSLY REPORTED AS DETECTED CRITICAL RESULT CALLED TO, READ BACK BY AND VERIFIED WITH: JASON ROBBINS [place] AT 2040 11/11/15 MSS.   Methicillin resistance  NOT DETECTED Corrected    DUPLICATE ORDER/ ORDERED IN ERROR SPOKE TO SCOTT CHRISTY 11/12/15 DV    Comment: CORRECTED ON 04/14  AT 1004: PREVIOUSLY REPORTED AS DETECTED CRITICAL RESULT CALLED TO, READ BACK BY AND VERIFIED WITH: Violeta Gelinas [place] AT 2040 11/11/15 MSS.   Streptococcus species NOT DETECTED NOT DETECTED Corrected    Streptococcus agalactiae NOT DETECTED NOT DETECTED Corrected   Streptococcus pneumoniae NOT DETECTED NOT DETECTED Corrected   Streptococcus pyogenes NOT DETECTED NOT DETECTED Corrected   Acinetobacter baumannii NOT DETECTED NOT DETECTED Corrected   Enterobacteriaceae species NOT DETECTED NOT DETECTED Corrected   Enterobacter cloacae complex NOT DETECTED NOT DETECTED Corrected   Escherichia coli NOT DETECTED NOT DETECTED Corrected   Klebsiella oxytoca NOT DETECTED NOT DETECTED Corrected   Klebsiella pneumoniae NOT DETECTED NOT DETECTED Corrected   Proteus species NOT DETECTED NOT DETECTED Corrected   Serratia marcescens NOT DETECTED NOT DETECTED Corrected   Carbapenem resistance NOT DETECTED NOT DETECTED Corrected   Haemophilus influenzae NOT DETECTED NOT DETECTED Corrected   Neisseria meningitidis NOT DETECTED NOT DETECTED Corrected   Pseudomonas aeruginosa NOT DETECTED NOT DETECTED Corrected   Candida albicans NOT DETECTED NOT DETECTED Corrected   Candida glabrata NOT DETECTED NOT DETECTED Corrected   Candida krusei NOT DETECTED NOT DETECTED Corrected   Candida parapsilosis NOT DETECTED NOT DETECTED Corrected   Candida tropicalis NOT DETECTED NOT DETECTED Corrected    RADIOLOGY:  No results found.      Management plans discussed with the patient, her husband and they are in agreement.  CODE STATUS:     Code Status Orders        Start     Ordered   11/09/15 0205  Full code   Continuous     11/09/15 0204    Code Status History    Date Active Date Inactive Code Status Order ID Comments User Context   02/26/2014  5:27 PM 03/04/2014  6:39 PM Full Code [id number]  Clarene Reamer, MD Inpatient   02/26/2014 11:58 AM 02/26/2014  5:27 PM Full Code [id number]  Evelina Bucy, MD ED      TOTAL TIME TAKING CARE OF THIS PATIENT: 35 minutes.    Demetrios Loll M.D on 11/19/2015 at 11:06 AM  Between 7am to 6pm - Pager - [phone]  After 6pm go to [email or web address] - password [email or web address]  [facility]  Office  [phone]  CC: Primary care physician; Lavera Guise, MD

## 2015-11-19 NOTE — Clinical Social Work Placement (Signed)
   CLINICAL SOCIAL WORK PLACEMENT  NOTE  Date:  11/19/2015  Patient Details  Name: Jamie Bennett MRN: [id number] Date of Birth: 1961-02-18  Clinical Social Work is seeking post-discharge placement for this patient at the [place] level of care (*CSW will initial, date and re-position this form in  chart as items are completed):  Yes   Patient/family provided with [facility] Work Department's list of facilities offering this level of care within the geographic area requested by the patient (or if unable, by the patient's family).  Yes   Patient/family informed of their freedom to choose among providers that offer the needed level of care, that participate in Medicare, Medicaid or managed care program needed by the patient, have an available bed and are willing to accept the patient.  Yes   Patient/family informed of [facility]'s ownership interest in [facility], as well as of the fact that they are under no obligation to receive care at these facilities.  PASRR submitted to EDS on 11/16/15     PASRR number received on 11/17/15 ([id number])     Existing PASRR number confirmed on       FL2 transmitted to all facilities in geographic area requested by pt/family on 11/16/15     FL2 transmitted to all facilities within larger geographic area on       Patient informed that his/her managed care company has contracts with or will negotiate with certain facilities, including the following:        Yes   Patient/family informed of bed offers received.  Patient chooses bed at  ([facility] )     Physician recommends and patient chooses bed at      Patient to be transferred to  ([facility] ) on 11/19/15.  Patient to be transferred to facility by  [facility] )     Patient family notified on 11/19/15 of transfer.  Name of family member notified:   (Patient's husband is at bedside and aware of D/C today. )     PHYSICIAN        Additional Comment:    _______________________________________________ Loralyn Freshwater, LCSW 11/19/2015, 12:25 PM

## 2015-11-19 NOTE — Care Management Important Message (Signed)
Important Message  Patient Details  Name: Jamie Bennett MRN: [id number] Date of Birth: May 06, 1961   Medicare Important Message Given:  Yes    Juliann Pulse A Linzie Criss 11/19/2015, 9:43 AM

## 2015-11-23 ENCOUNTER — Ambulatory Visit: Payer: Medicare Other | Admitting: Psychiatry

## 2015-11-23 DIAGNOSIS — R4182 Altered mental status, unspecified: Secondary | ICD-10-CM | POA: Diagnosis not present

## 2015-11-23 DIAGNOSIS — M159 Polyosteoarthritis, unspecified: Secondary | ICD-10-CM | POA: Diagnosis not present

## 2015-11-23 DIAGNOSIS — K210 Gastro-esophageal reflux disease with esophagitis: Secondary | ICD-10-CM | POA: Diagnosis not present

## 2015-11-23 DIAGNOSIS — R69 Illness, unspecified: Secondary | ICD-10-CM | POA: Diagnosis not present

## 2015-11-23 DIAGNOSIS — D649 Anemia, unspecified: Secondary | ICD-10-CM | POA: Diagnosis not present

## 2015-11-23 DIAGNOSIS — N189 Chronic kidney disease, unspecified: Secondary | ICD-10-CM | POA: Diagnosis not present

## 2015-11-23 DIAGNOSIS — M069 Rheumatoid arthritis, unspecified: Secondary | ICD-10-CM | POA: Diagnosis not present

## 2015-12-01 DIAGNOSIS — R69 Illness, unspecified: Secondary | ICD-10-CM | POA: Diagnosis not present

## 2015-12-01 DIAGNOSIS — M069 Rheumatoid arthritis, unspecified: Secondary | ICD-10-CM | POA: Diagnosis not present

## 2015-12-01 DIAGNOSIS — M159 Polyosteoarthritis, unspecified: Secondary | ICD-10-CM | POA: Diagnosis not present

## 2015-12-01 DIAGNOSIS — K219 Gastro-esophageal reflux disease without esophagitis: Secondary | ICD-10-CM | POA: Diagnosis not present

## 2015-12-01 DIAGNOSIS — D649 Anemia, unspecified: Secondary | ICD-10-CM | POA: Diagnosis not present

## 2015-12-01 DIAGNOSIS — E039 Hypothyroidism, unspecified: Secondary | ICD-10-CM | POA: Diagnosis not present

## 2015-12-08 DIAGNOSIS — D649 Anemia, unspecified: Secondary | ICD-10-CM | POA: Diagnosis not present

## 2015-12-08 DIAGNOSIS — R69 Illness, unspecified: Secondary | ICD-10-CM | POA: Diagnosis not present

## 2015-12-08 DIAGNOSIS — R4182 Altered mental status, unspecified: Secondary | ICD-10-CM | POA: Diagnosis not present

## 2015-12-08 DIAGNOSIS — M069 Rheumatoid arthritis, unspecified: Secondary | ICD-10-CM | POA: Diagnosis not present

## 2015-12-08 DIAGNOSIS — E039 Hypothyroidism, unspecified: Secondary | ICD-10-CM | POA: Diagnosis not present

## 2015-12-08 DIAGNOSIS — K210 Gastro-esophageal reflux disease with esophagitis: Secondary | ICD-10-CM | POA: Diagnosis not present

## 2015-12-13 ENCOUNTER — Inpatient Hospital Stay: Payer: Medicare HMO

## 2015-12-13 ENCOUNTER — Inpatient Hospital Stay: Payer: Medicare HMO | Admitting: Family Medicine

## 2015-12-20 ENCOUNTER — Ambulatory Visit: Payer: Medicare HMO | Admitting: Hematology and Oncology

## 2015-12-21 ENCOUNTER — Inpatient Hospital Stay: Payer: Medicare HMO

## 2015-12-21 ENCOUNTER — Inpatient Hospital Stay: Payer: Medicare HMO | Attending: Hematology and Oncology

## 2015-12-21 ENCOUNTER — Inpatient Hospital Stay (HOSPITAL_BASED_OUTPATIENT_CLINIC_OR_DEPARTMENT_OTHER): Payer: Medicare HMO | Admitting: Hematology and Oncology

## 2015-12-21 ENCOUNTER — Other Ambulatory Visit: Payer: Self-pay

## 2015-12-21 VITALS — BP 105/76 | HR 85 | Temp 94.9°F | Resp 18 | Ht 66.0 in | Wt 248.8 lb

## 2015-12-21 DIAGNOSIS — F419 Anxiety disorder, unspecified: Secondary | ICD-10-CM

## 2015-12-21 DIAGNOSIS — K5900 Constipation, unspecified: Secondary | ICD-10-CM | POA: Diagnosis not present

## 2015-12-21 DIAGNOSIS — R5383 Other fatigue: Secondary | ICD-10-CM | POA: Diagnosis not present

## 2015-12-21 DIAGNOSIS — E538 Deficiency of other specified B group vitamins: Secondary | ICD-10-CM | POA: Diagnosis not present

## 2015-12-21 DIAGNOSIS — D72819 Decreased white blood cell count, unspecified: Secondary | ICD-10-CM | POA: Diagnosis not present

## 2015-12-21 DIAGNOSIS — Z9884 Bariatric surgery status: Secondary | ICD-10-CM

## 2015-12-21 DIAGNOSIS — F319 Bipolar disorder, unspecified: Secondary | ICD-10-CM

## 2015-12-21 DIAGNOSIS — D509 Iron deficiency anemia, unspecified: Secondary | ICD-10-CM

## 2015-12-21 DIAGNOSIS — E079 Disorder of thyroid, unspecified: Secondary | ICD-10-CM | POA: Diagnosis not present

## 2015-12-21 DIAGNOSIS — M818 Other osteoporosis without current pathological fracture: Secondary | ICD-10-CM

## 2015-12-21 DIAGNOSIS — Z79899 Other long term (current) drug therapy: Secondary | ICD-10-CM | POA: Insufficient documentation

## 2015-12-21 DIAGNOSIS — M797 Fibromyalgia: Secondary | ICD-10-CM | POA: Diagnosis not present

## 2015-12-21 DIAGNOSIS — M069 Rheumatoid arthritis, unspecified: Secondary | ICD-10-CM | POA: Diagnosis not present

## 2015-12-21 DIAGNOSIS — M1990 Unspecified osteoarthritis, unspecified site: Secondary | ICD-10-CM | POA: Diagnosis not present

## 2015-12-21 LAB — CBC WITH DIFFERENTIAL/PLATELET
Basophils Absolute: 0 10*3/uL (ref 0–0.1)
Basophils Relative: 1 %
Eosinophils Absolute: 0.2 10*3/uL (ref 0–0.7)
Eosinophils Relative: 5 %
HCT: 35.7 % (ref 35.0–47.0)
Hemoglobin: 12.2 g/dL (ref 12.0–16.0)
Lymphocytes Relative: 39 %
Lymphs Abs: 2 10*3/uL (ref 1.0–3.6)
MCH: 33.5 pg (ref 26.0–34.0)
MCHC: 34.1 g/dL (ref 32.0–36.0)
MCV: 98.3 fL (ref 80.0–100.0)
Monocytes Absolute: 0.4 10*3/uL (ref 0.2–0.9)
Monocytes Relative: 8 %
Neutro Abs: 2.4 10*3/uL (ref 1.4–6.5)
Neutrophils Relative %: 47 %
Platelets: 292 10*3/uL (ref 150–440)
RBC: 3.63 MIL/uL — ABNORMAL LOW (ref 3.80–5.20)
RDW: 13.5 % (ref 11.5–14.5)
WBC: 5.1 10*3/uL (ref 3.6–11.0)

## 2015-12-21 LAB — FERRITIN: Ferritin: 217 ng/mL (ref 11–307)

## 2015-12-21 LAB — IRON AND TIBC
Iron: 88 ug/dL (ref 28–170)
Saturation Ratios: 28 % (ref 10.4–31.8)
TIBC: 320 ug/dL (ref 250–450)
UIBC: 232 ug/dL

## 2015-12-21 LAB — SEDIMENTATION RATE: Sed Rate: 36 mm/hr — ABNORMAL HIGH (ref 0–30)

## 2015-12-21 MED ORDER — CYANOCOBALAMIN 1000 MCG/ML IJ SOLN
1000.0000 ug | Freq: Once | INTRAMUSCULAR | Status: AC
  Administered 2015-12-21: 1000 ug via INTRAMUSCULAR
  Filled 2015-12-21: qty 1

## 2015-12-21 NOTE — Progress Notes (Signed)
Pt reports on 4/11 she had taken to much Provigil the week before and was hospitalized, intubated.   Pt reports she has pain in left shoulder and neck and wonders if it was related to the IJ placed in neck.  Pt reports she has reported to rehab and it was reported during her stay .  Pt weight has decreased while in hospital by 30lbs and reports not much of an appetite.  Pt reports it has been difficult mobilty and balance back and leaves her frustrated

## 2015-12-21 NOTE — Progress Notes (Addendum)
[facility] day:  12/21/2015  Chief Complaint: Jamie Bennett is a 55 y.o. female status post gastric bypass surgery with iron deficiency anemia, B12 deficiency, and leukopenia who is seen for 3 month assessment.  HPI: The patient was last seen in the medical oncology clinic on 05/14/2015.  At that time, she was fatigued.  Her muscles ached.  Exam was unremarkable.  Labs included a hematocrit of 36.8, hemoglobin 12.3, MCV 95.2, platelets 465,000, WBC 10,700 with an ANC of 8,000.  Ferritin was 160 with iron saturation 24%.  Folate was 19.5.  She received B12.  She continued to receive monthly B12 (05/14/2015 - 11/17/2015).  She was admitted from 11/08/2015 - 11/19/2015 with altered mental status secondary to an overdose of  Provigil.  Course was complicated by metabolic encephalopathy, intubation, acute renal failure requiring hemodialysis, MSSA pneumonia, Klebsiella UTI, and acute hepatic failure.  CBC at discharge included a hematocrit of 27.1, hemoglobin 9.2, MCV 95.1, platelets 103,000, and WBC 8900.    She was discharged to a skilled nursing facility for 20 days.  She states that she could not walk initially after discharge.  She also had limited ROM of her arms.  She is improving, but still has issues with her left arm.  She describes pain in her shoulder and neck on the left.  She is using a cane for balance.  She will soon be getting home physical therapy.   Past Medical History  Diagnosis Date  . Osteoarthritis   . Osteoporosis   . Rheumatoid arthritis ([place])   . Thyroid disease   . Hypotension   . Constipation   . Anemia   . Bipolar 1 disorder ([place])   . Anxiety   . Fibromyalgia     Past Surgical History  Procedure Laterality Date  . Gastric bypass    . Cholecystectomy      Family History  Problem Relation Age of Onset  . Depression Mother   . Dementia Mother   . Heart disease Mother   . Parkinson's disease Father      Social History:  reports that she has never smoked. She does not have any smokeless tobacco history on file. She reports that she does not drink alcohol or use illicit drugs.  The patient is accompanied by her husband today.  Allergies: No Known Allergies  Current Medications: Current Outpatient Prescriptions  Medication Sig Dispense Refill  . ALPRAZolam (XANAX) 1 MG tablet Take 1 tablet (1 mg total) by mouth 4 (four) times daily as needed. (Patient taking differently: Take 1 mg by mouth 4 (four) times daily as needed for anxiety. ) 360 tablet 0  . Biotin 5000 MCG TABS Take 5,000 mcg by mouth daily.     . busPIRone (BUSPAR) 10 MG tablet Take 20 mg by mouth 4 (four) times daily.    . Cholecalciferol (HM VITAMIN D3) 4000 units CAPS Take 4,000 capsules by mouth daily.    . cyanocobalamin (,VITAMIN B-12,) 1000 MCG/ML injection Inject 1,000 mcg into the muscle every 30 (thirty) days.    Marland Kitchen esomeprazole (NEXIUM) 40 MG capsule Take 1 capsule (40 mg total) by mouth daily at 12 noon.    . Fiber, Guar Gum, CHEW Chew 10 mg by mouth daily.    . hydroxychloroquine (PLAQUENIL) 200 MG tablet Take 200 mg by mouth See admin instructions. 200 mg twice a day Mon-Friday only    . lamoTRIgine (LAMICTAL) 150 MG tablet Take 150 tablets by  mouth 2 (two) times daily.    Marland Kitchen levothyroxine (SYNTHROID, LEVOTHROID) 112 MCG tablet Take 112 mcg by mouth daily.    . Magnesium 400 MG CAPS Take 400 mg by mouth.    . Multiple Vitamin (MULTIVITAMIN WITH MINERALS) TABS tablet Take 1 tablet by mouth daily.    Marland Kitchen tiZANidine (ZANAFLEX) 4 MG capsule Take 4 mg by mouth 2 (two) times daily as needed for muscle spasms.     . traZODone (DESYREL) 50 MG tablet Take 1- 4 tablets nightly if needed for insomnia 360 tablet 0  . venlafaxine (EFFEXOR) 100 MG tablet Take 100 mg by mouth 3 (three) times daily.     . zoledronic acid (RECLAST) 5 MG/100ML SOLN injection Inject 5 mg into the vein See admin instructions. Patient takes yearly     No  current facility-administered medications for this visit.    Review of Systems:  GENERAL:  Fatigue.  No fevers or sweats.  Weight down 30 pounds with recent hospitalization. PERFORMANCE STATUS (ECOG):  1 HEENT:  No visual changes, runny nose, sore throat, mouth sores or tenderness. Lungs: No shortness of breath or cough.  No hemoptysis. Cardiac:  No chest pain, palpitations, orthopnea, or PND. GI:  No nausea, vomiting, diarrhea, constipation, melena or hematochezia. GU:  No urgency, frequency, dysuria, or hematuria. Musculoskeletal:  Left shoulder and neck pain.  Extremities:  No pain or swelling. Skin:  No rashes or skin changes. Neuro:  Balance issues.  No headache, numbness or weakness, or coordination issues. Endocrine:  No diabetes.  Thyroid issues on Synthroid.  hot flashes or night sweats. Psych:  No mood changes, depression or anxiety. Pain:  Joint pain. Review of systems:  All other systems reviewed and found to be negative.  Physical Exam: Blood pressure 105/76, pulse 85, temperature 94.9 F (34.9 C), temperature source Tympanic, resp. rate 18, height '5\' 6"'  (1.676 m), weight 248 lb 12.6 oz (112.85 kg). GENERAL:  Well developed, well nourished, woman sitting comfortably in the exam room in no acute distress.  Needs assistance.  She has a cane at her side. MENTAL STATUS:  Alert and oriented to person, place and time. HEAD:  Shoulder length brown hair.  Normocephalic, atraumatic, face symmetric, no Cushingoid features. EYES:  Gold rimmed glasses.  Brown eyes.  Pupils equal round and reactive to light and accomodation.  No conjunctivitis or scleral icterus. ENT:  Oropharynx clear without lesion.  Tongue normal. Mucous membranes moist.  RESPIRATORY:  Clear to auscultation without rales, wheezes or rhonchi. CARDIOVASCULAR:  Regular rate and rhythm without murmur, rub or gallop. ABDOMEN:  Fully round.  Soft, non-tender, with active bowel sounds, and no hepatosplenomegaly.  No  masses. SKIN: No rashes, ulcers or lesions. EXTREMITIES: No edema, no skin discoloration or tenderness.  No palpable cords. LYMPH NODES: No palpable cervical, supraclavicular, axillary or inguinal adenopathy  NEUROLOGICAL: Limited ROM left arm. PSYCH:  Appropriate.  Appointment on 12/21/2015  Component Date Value Ref Range Status  . WBC 12/21/2015 5.1  3.6 - 11.0 K/uL Final  . RBC 12/21/2015 3.63* 3.80 - 5.20 MIL/uL Final  . Hemoglobin 12/21/2015 12.2  12.0 - 16.0 g/dL Final  . HCT 12/21/2015 35.7  35.0 - 47.0 % Final  . MCV 12/21/2015 98.3  80.0 - 100.0 fL Final  . MCH 12/21/2015 33.5  26.0 - 34.0 pg Final  . MCHC 12/21/2015 34.1  32.0 - 36.0 g/dL Final  . RDW 12/21/2015 13.5  11.5 - 14.5 % Final  . Platelets 12/21/2015 292  150 - 440 K/uL Final  . Neutrophils Relative % 12/21/2015 47   Final  . Neutro Abs 12/21/2015 2.4  1.4 - 6.5 K/uL Final  . Lymphocytes Relative 12/21/2015 39   Final  . Lymphs Abs 12/21/2015 2.0  1.0 - 3.6 K/uL Final  . Monocytes Relative 12/21/2015 8   Final  . Monocytes Absolute 12/21/2015 0.4  0.2 - 0.9 K/uL Final  . Eosinophils Relative 12/21/2015 5   Final  . Eosinophils Absolute 12/21/2015 0.2  0 - 0.7 K/uL Final  . Basophils Relative 12/21/2015 1   Final  . Basophils Absolute 12/21/2015 0.0  0 - 0.1 K/uL Final    Assessment:  Jamie Bennett is a 55 y.o. female female with a history of gastric bypass surgery in 2003. She has B12 deficiency. She has a history of iron deficiency anemia. Last colonoscopy was 7 years ago. Diet is good. She eats rich foods. She Is unable to absorb oral iron. She received IV iron in 02/2014. Ferritin was 38 and B12 619 on 02/05/2015. Iron saturation was 19%.  She also has a has a history of rheumatoid arthritis for which she received Humira in the past as well as methotrexate. Humira caused leukopenia. Methotrexate caused liver toxicity. She is currently on Plaquenil.   She was admitted from 11/08/2015 -  11/19/2015 with altered mental status secondary to an overdose of  Provigil.  Course was complicated by metabolic encephalopathy, intubation, acute renal failure requiring hemodialysis, MSSA pneumonia, Klebsiella UTI, and acute hepatic failure.  CBC at discharge included a hematocrit of 27.1, hemoglobin 9.2, MCV 95.1, platelets 103,000, and WBC 8900.    Symptomatically, she is fatigued, but improving.  Exam is stable.  Counts have recovered.  Plan: 1.  Labs today:  CBC with diff, ferritin, iron studies, ESR. 2.  B12 today. 3.  RTC monthly for B12. 4.  RTC in 4 months for MD assessment, labs (CBC with diff, ferritin, iron studies, folate), and B12.   Lequita Asal, MD  12/21/2015, 11:47 AM

## 2015-12-22 ENCOUNTER — Ambulatory Visit
Admission: RE | Admit: 2015-12-22 | Discharge: 2015-12-22 | Disposition: A | Discharge status: Home / Self Care | Payer: Medicare HMO | Source: Ambulatory Visit | Attending: Nurse Practitioner | Admitting: Nurse Practitioner

## 2015-12-22 ENCOUNTER — Other Ambulatory Visit: Payer: Self-pay | Admitting: Nurse Practitioner

## 2015-12-22 DIAGNOSIS — M25512 Pain in left shoulder: Secondary | ICD-10-CM | POA: Diagnosis not present

## 2015-12-22 DIAGNOSIS — R945 Abnormal results of liver function studies: Secondary | ICD-10-CM | POA: Diagnosis not present

## 2015-12-22 DIAGNOSIS — I959 Hypotension, unspecified: Secondary | ICD-10-CM | POA: Diagnosis not present

## 2015-12-22 DIAGNOSIS — D509 Iron deficiency anemia, unspecified: Secondary | ICD-10-CM | POA: Diagnosis not present

## 2015-12-22 DIAGNOSIS — N179 Acute kidney failure, unspecified: Secondary | ICD-10-CM | POA: Diagnosis not present

## 2015-12-22 DIAGNOSIS — R531 Weakness: Secondary | ICD-10-CM | POA: Diagnosis not present

## 2015-12-23 ENCOUNTER — Encounter: Payer: Self-pay | Admitting: Psychiatry

## 2015-12-23 ENCOUNTER — Ambulatory Visit (INDEPENDENT_AMBULATORY_CARE_PROVIDER_SITE_OTHER): Payer: Medicare HMO | Admitting: Psychiatry

## 2015-12-23 ENCOUNTER — Encounter: Payer: Self-pay | Admitting: Hematology and Oncology

## 2015-12-23 VITALS — BP 118/82 | HR 116 | Temp 97.9°F | Ht 66.0 in | Wt 250.0 lb

## 2015-12-23 DIAGNOSIS — F3170 Bipolar disorder, currently in remission, most recent episode unspecified: Secondary | ICD-10-CM

## 2015-12-23 DIAGNOSIS — F332 Major depressive disorder, recurrent severe without psychotic features: Secondary | ICD-10-CM

## 2015-12-23 DIAGNOSIS — F411 Generalized anxiety disorder: Secondary | ICD-10-CM

## 2015-12-23 MED ORDER — BUSPIRONE HCL 10 MG PO TABS: 20.0000 mg | ORAL_TABLET | Freq: Two times a day (BID) | ORAL | Status: DC

## 2015-12-23 MED ORDER — VENLAFAXINE HCL 75 MG PO TABS: 225.0000 mg | ORAL_TABLET | Freq: Every morning | ORAL | Status: DC

## 2015-12-23 MED ORDER — TRAZODONE HCL 100 MG PO TABS: ORAL_TABLET | ORAL | Status: DC

## 2015-12-23 MED ORDER — LAMOTRIGINE 150 MG PO TABS: 150.0000 mg | ORAL_TABLET | Freq: Two times a day (BID) | ORAL | Status: DC

## 2015-12-23 NOTE — Progress Notes (Signed)
Patient ID: Jamie Bennett, female   DOB: 01/08/1961, 55 y.o.   MRN: [id number] [facility] MD/PA/NP OP Progress Note  12/23/2015 1:02 PM LUTHER MOSHER  MRN:  [id number]  Subjective:  Patient returns for follow-up of her generalized anxiety disorder and  bipolar disorder. Patient was previously seen by Dr.Williams. This is the first visit for this patient with this clinician. She reports that she is taking her medications different from what is documented. States she takes xanax as needed and not daily. States she took xanax twice in 2 weeks at a total of 2mg . States she takes the Buspar at 20mg  po bi, takes Venlafaxine at 300mg  daily and Lamictal at 150mg  twice daily. States she was doing okay until April. She was started on modafanil by her Rheumatologist for fatigue which led to a severe reaction leading to ICU  Admission and an induced coma. She was in rehabilitation for 20 days. States she still has some residual weakness but feeling much better mentally. Poor appetite secondary to her recent drug reaction, sleeping okay. Overall reports doing much better than before.   Chief Complaint: Doing better  Visit Diagnosis:     ICD-9-CM ICD-10-CM   1. GAD (generalized anxiety disorder) 300.02 F41.1   2. Bipolar disorder in full remission, most recent episode unspecified type ([place]) 296.80 F31.70   3. Major depressive disorder, recurrent, severe without psychotic features ([place]) 296.33 F33.2     Past Medical History:  Past Medical History  Diagnosis Date  . Osteoarthritis   . Osteoporosis   . Rheumatoid arthritis ([place])   . Thyroid disease   . Hypotension   . Constipation   . Anemia   . Bipolar 1 disorder ([place])   . Anxiety   . Fibromyalgia     Past Surgical History  Procedure Laterality Date  . Gastric bypass    . Cholecystectomy     Family History:  Family History  Problem Relation Age of Onset  . Depression Mother   . Dementia Mother   . Heart disease Mother   . Parkinson's  disease Father    Social History:  Social History   Social History  . Marital Status: Married    Spouse Name: N/A  . Number of Children: N/A  . Years of Education: N/A   Social History Main Topics  . Smoking status: Never Smoker   . Smokeless tobacco: Not on file  . Alcohol Use: No  . Drug Use: No  . Sexual Activity: Yes   Other Topics Concern  . Not on file   Social History Narrative   Additional History:   Assessment:   Musculoskeletal: Strength & Muscle Tone: within normal limits Gait & Station: normal Patient leans: N/A  Psychiatric Specialty Exam: HPI  Review of Systems  Psychiatric/Behavioral: Negative for depression, suicidal ideas, hallucinations, memory loss and substance abuse. The patient is nervous/anxious. The patient does not have insomnia.   All other systems reviewed and are negative.   There were no vitals taken for this visit.There is no weight on file to calculate BMI.  General Appearance: Fairly Groomed  Eye Contact:  Good  Speech:  Normal Rate  Volume:  Normal  Mood:  Okay  Affect:  normal  Thought Process:  Circumstantial  Orientation:  Full (Time, Place, and Person)  Thought Content:  Negative  Suicidal Thoughts:  No  Homicidal Thoughts:  No  Memory:  Immediate;   Good Recent;   Good Remote;   Good  Judgement:  Good  Insight:  Fair  Psychomotor Activity:  Negative  Concentration:  Good  Recall:  Good  Fund of Knowledge: Good  Language: Good  Akathisia:  Negative  Handed:   AIMS (if indicated):    Assets:  Desire for Improvement Social Support  ADL's:  Intact  Cognition: WNL  Sleep:  good   Is the patient at risk to self?  No. Has the patient been a risk to self in the past 6 months?  No. Has the patient been a risk to self within the distant past?  Yes.   Is the patient a risk to others?  No. Has the patient been a risk to others in the past 6 months?  No. Has the patient been a risk to others within the distant past?   No.  Current Medications: Current Outpatient Prescriptions  Medication Sig Dispense Refill  . ALPRAZolam (XANAX) 1 MG tablet Take 1 tablet (1 mg total) by mouth 4 (four) times daily as needed. (Patient taking differently: Take 1 mg by mouth 4 (four) times daily as needed for anxiety. ) 360 tablet 0  . Biotin 5000 MCG TABS Take 5,000 mcg by mouth daily.     . busPIRone (BUSPAR) 10 MG tablet Take 20 mg by mouth 4 (four) times daily.    . Cholecalciferol (HM VITAMIN D3) 4000 units CAPS Take 4,000 capsules by mouth daily.    . cyanocobalamin (,VITAMIN B-12,) 1000 MCG/ML injection Inject 1,000 mcg into the muscle every 30 (thirty) days.    Marland Kitchen esomeprazole (NEXIUM) 40 MG capsule Take 1 capsule (40 mg total) by mouth daily at 12 noon.    . Fiber, Guar Gum, CHEW Chew 10 mg by mouth daily.    . hydroxychloroquine (PLAQUENIL) 200 MG tablet Take 200 mg by mouth See admin instructions. 200 mg twice a day Mon-Friday only    . lamoTRIgine (LAMICTAL) 150 MG tablet Take 150 tablets by mouth 2 (two) times daily.    Marland Kitchen levothyroxine (SYNTHROID, LEVOTHROID) 112 MCG tablet Take 112 mcg by mouth daily.    . Magnesium 400 MG CAPS Take 400 mg by mouth.    . Multiple Vitamin (MULTIVITAMIN WITH MINERALS) TABS tablet Take 1 tablet by mouth daily.    Marland Kitchen tiZANidine (ZANAFLEX) 4 MG capsule Take 4 mg by mouth 2 (two) times daily as needed for muscle spasms.     . traZODone (DESYREL) 50 MG tablet Take 1- 4 tablets nightly if needed for insomnia 360 tablet 0  . venlafaxine (EFFEXOR) 100 MG tablet Take 100 mg by mouth 3 (three) times daily.     . zoledronic acid (RECLAST) 5 MG/100ML SOLN injection Inject 5 mg into the vein See admin instructions. Patient takes yearly     No current facility-administered medications for this visit.    Medical Decision Making:  Established Problem, Stable/Improving (1), Review of Medication Regimen & Side Effects (2) and Review of New Medication or Change in Dosage (2)  Treatment Plan  Summary:Medication management and Plan Plan   Generalized anxiety disorder- Decrease Effexor to 225mg  po qd, patient aware that is the highest approved dose. Continue buspar 20mg  po bid. Patient recommended to stop Xanax completely. Patient reports that her husband has The Xanax with him and she has not taken more than 2 mg in the last 2 weeks.  Bipolar disorder Continue Lamictal at 150mg  po bid.  Insomnia Take Trazodone 100mg  po qhs prn.  Return to clinic in  time in 3 months time or call before if  necessary      .Oluchi Pucci 12/23/2015, 1:02 PM

## 2015-12-29 DIAGNOSIS — N179 Acute kidney failure, unspecified: Secondary | ICD-10-CM | POA: Diagnosis not present

## 2015-12-29 DIAGNOSIS — R799 Abnormal finding of blood chemistry, unspecified: Secondary | ICD-10-CM | POA: Diagnosis not present

## 2015-12-29 DIAGNOSIS — E860 Dehydration: Secondary | ICD-10-CM | POA: Diagnosis not present

## 2015-12-29 DIAGNOSIS — M791 Myalgia: Secondary | ICD-10-CM | POA: Diagnosis not present

## 2016-01-10 DIAGNOSIS — D649 Anemia, unspecified: Secondary | ICD-10-CM | POA: Diagnosis not present

## 2016-01-10 DIAGNOSIS — E878 Other disorders of electrolyte and fluid balance, not elsewhere classified: Secondary | ICD-10-CM | POA: Diagnosis not present

## 2016-01-10 DIAGNOSIS — R55 Syncope and collapse: Secondary | ICD-10-CM | POA: Diagnosis not present

## 2016-01-10 DIAGNOSIS — N179 Acute kidney failure, unspecified: Secondary | ICD-10-CM | POA: Diagnosis not present

## 2016-01-10 DIAGNOSIS — R945 Abnormal results of liver function studies: Secondary | ICD-10-CM | POA: Diagnosis not present

## 2016-01-10 DIAGNOSIS — R69 Illness, unspecified: Secondary | ICD-10-CM | POA: Diagnosis not present

## 2016-01-10 DIAGNOSIS — M064 Inflammatory polyarthropathy: Secondary | ICD-10-CM | POA: Diagnosis not present

## 2016-01-11 ENCOUNTER — Ambulatory Visit: Payer: Medicare HMO | Attending: Internal Medicine | Admitting: Physical Therapy

## 2016-01-11 ENCOUNTER — Encounter: Payer: Self-pay | Admitting: Physical Therapy

## 2016-01-11 VITALS — BP 106/90

## 2016-01-11 DIAGNOSIS — R262 Difficulty in walking, not elsewhere classified: Secondary | ICD-10-CM

## 2016-01-11 DIAGNOSIS — M25512 Pain in left shoulder: Secondary | ICD-10-CM

## 2016-01-11 DIAGNOSIS — M6281 Muscle weakness (generalized): Secondary | ICD-10-CM | POA: Diagnosis not present

## 2016-01-11 NOTE — Therapy (Signed)
[place] MAIN [facility] SERVICES [address] [place], [place], [address] Phone: [phone]   Fax:  [phone]  Physical Therapy Evaluation  Patient Details  Name: Jamie Bennett MRN: [id number] Date of Birth: 10-04-1960 Referring Provider: Dorthula Perfect Date: 01/11/2016      PT End of Session - 01/11/16 1753    Visit Number 1   Number of Visits 25   Date for PT Re-Evaluation Apr 09, 2016   Authorization Type g codes   Authorization Time Period 1/10   PT Start Time 1635   PT Stop Time 1720   PT Time Calculation (min) 45 min   Activity Tolerance Patient tolerated treatment well   Behavior During Therapy [facility] for tasks assessed/performed      Past Medical History  Diagnosis Date  . Osteoarthritis   . Osteoporosis   . Rheumatoid arthritis ([place])   . Thyroid disease   . Hypotension   . Constipation   . Anemia   . Bipolar 1 disorder ([place])   . Anxiety   . Fibromyalgia     Past Surgical History  Procedure Laterality Date  . Gastric bypass    . Cholecystectomy      Filed Vitals:   01/11/16 1756  BP: 106/90         Subjective Assessment - 01/11/16 1649    Subjective Pt reports she has decreased endurance and L neck and shoulder pain since her recent hospitalization in April. She feels like she has improved but would like to increase her strength and balance.   Patient is accompained by: Family member   Pertinent History Pt was hospitalized in April 10th, 2017 for weakness and lethargy, for possible medication overdose. After hospital discharge on April 21st, 2017, pt went to STR at [facility] until the end of May. Pt now presents to therapy for deconditioning and also c/o L neck and shoulder pain since her hospitalization.   How long can you stand comfortably? 5 to 10 minutes   How long can you walk comfortably? limited distances   Diagnostic tests DG L shoulder showed normal findings   Patient Stated Goals to improve  strength, balance and walking, reduce L shoulder pain   Currently in Pain? Yes   Pain Score 3   L neck/scapular area   Pain Descriptors / Indicators Aching   Pain Type Acute pain   Pain Onset More than a month ago   Aggravating Factors  lifting L arm   Pain Relieving Factors rest            [facility] PT Assessment - 01/11/16 0001    Assessment   Medical Diagnosis deconditioning   Referring Provider Humphrey Rolls   Hand Dominance Right   Next MD Visit --  in about a month per patient   Prior Therapy in the hospital and at [facility] after hospital discharge   Precautions   Precautions None   Restrictions   Weight Bearing Restrictions No   Balance Screen   Has the patient fallen in the past 6 months Yes   How many times? 1  pt feels it was BP related   Has the patient had a decrease in activity level because of a fear of falling?  Yes   Is the patient reluctant to leave their home because of a fear of falling?  No   Home Environment   Living Environment Private residence   Living Arrangements Spouse/significant other   Available Help at Discharge Family  Type of [place] to enter   Entrance Stairs-Number of Steps 3   Entrance Stairs-Rails None   Home Layout Two level;Able to live on main level with bedroom/bathroom   [place] - 2 wheels;Cane - single point;Tub bench   Prior Function   Level of Independence Independent;Other (comment)  totally independent with ADLs, driving prior hospitalization   Vocation Unemployed   Leisure crafts, Education officer, environmental, reading      POSTURE/OBSERVATION: Rounded shoulders, forward head, flexed posture  PROM/AROM: L UE AROM WFL except, flexion 90 degrees, abduction 70 degrees; limited by pain in L shoulder L UE PROM WFL R shoulder AROM WFL except, flexion 110 degrees, abduction 90 degrees R UE PROM WFL L LE WFL R LE WFL  PALPATION: L shoulder TTP superior scapula and anterior  shoulder   STRENGTH:  Graded on a 0-5 scale Muscle Group Left Right  Shoulder flex 3/5 3/5  Shoulder Abd 3/5 3/5  Shoulder Ext    Shoulder IR/ER 3+/5 3+/5  Elbow 4/5 4/5  Wrist/hand     Hip Flex 3+/5 3+/5  Hip Abd 3/5 3/5  Hip Add    Hip Ext    Hip IR/ER    Knee Flex 4-/5 4-/5  Knee Ext 4-/5 4-/5  Ankle DF 4/5 4/5  Ankle PF     SENSATION: B UE intact light touch B LE intact light touch except, L L1 to L3 area reduced sensation with occasional tingling   SPECIAL TESTS: (+) Hawkins-Kennedy test L shoulder    BALANCE: Static standing balance - good; Dynamic standing balance - fair  GAIT: B trendelenburg gait indicating B hip weakness, flexed trunk, decreased arm swing  OUTCOME MEASURES: TEST Outcome Interpretation  5 times sit<>stand 13 sec >60 yo, >15 sec indicates increased risk for falls  10 meter walk test         0.7        m/s <1.0 m/s indicates increased risk for falls; limited community ambulator   ABC Scale - 60% (increased risk of falls)   Therex:  B LE standing hip SLR, abd, ext x10 with RTB Mini squats x10 Heel raises x10  Cues for proper technique of exercise to target appropriate muscles, and for slow eccentric contractions.  Provided handout of exercises above for HEP. See pt instructions.                      PT Education - 01/11/16 1800    Education provided Yes   Education Details HEP, see pt instructions   Person(s) Educated Patient   Methods Explanation;Demonstration;Handout   Comprehension Verbalized understanding;Returned demonstration             PT Long Term Goals - 01/11/16 1801    PT LONG TERM GOAL #1   Title Pt will score >80% on ABC scale for increased functional mobilty and confidence to perform daily tasks within 12 weeks.   Baseline 60% (increased risk of falls)   Time 12   Period Weeks   Status New   PT LONG TERM GOAL #2   Title Pt will score > 1.0 m/s on 10 meter walk test to increase  functional mobility and reduce risk of falls within 12 weeks.   Baseline 0.7 m/s   Time 12   Period Weeks   Status New   PT LONG TERM GOAL #3   Title Pt will score <10 seconds on 5 x STS for  increased functional mobilty and reduced risk of falls within 12 weeks.   Baseline 13 seconds   Time 12   Period Weeks   Status New   PT LONG TERM GOAL #4   Title Pt will demonstrate B hip and knee strength of 5/5 for increased functional mobility and increased activity tolerance within 12 weeks.   Baseline B hips 3/5 to 3+/5, B knees 4-/5 to 4/5   Time 12   Period Weeks   Status New               Plan - 01/11/16 1754    Clinical Impression Statement Pt demonstrated deficits of LE strength, endurance, balance and gait after recent hospitalization and STR stay. She also has L neck and shoulder pain with decreased B shoulder strength and ROM (L less than R). She will benefit from skilled PT services to address functional deficits and progress towards PLOF.   Rehab Potential Good   Clinical Impairments Affecting Rehab Potential multiple comorbidities, obesity   PT Frequency 2x / week   PT Duration 12 weeks   PT Treatment/Interventions Gait training;Stair training;Therapeutic activities;Therapeutic exercise;Balance training;Neuromuscular re-education;Patient/family education;Manual techniques   PT Next Visit Plan assess L shoulder, shoulder strengthening, LE strengthening   PT Home Exercise Plan standing hip SLR, abd, ext, mini squats, heel raises   Consulted and Agree with Plan of Care Patient;Family member/caregiver   Family Member Consulted husband      Patient will benefit from skilled therapeutic intervention in order to improve the following deficits and impairments:  Decreased balance, Decreased endurance, Decreased range of motion, Decreased strength, Difficulty walking, Impaired sensation, Improper body mechanics, Obesity, Pain  Visit Diagnosis: Muscle weakness (generalized) -  Plan: PT plan of care cert/re-cert  Difficulty in walking, not elsewhere classified - Plan: PT plan of care cert/re-cert  Pain in left shoulder - Plan: PT plan of care cert/re-cert      G-Codes - [id number]    Functional Assessment Tool Used clinical judgement, 5xSTS, 10 meter walk, ABC scale   Functional Limitation Mobility: Walking and moving around   Mobility: Walking and Moving Around Current Status [id number]) At least 40 percent but less than 60 percent impaired, limited or restricted   Mobility: Walking and Moving Around Goal Status [id number]) At least 1 percent but less than 20 percent impaired, limited or restricted       Problem List Patient Active Problem List   Diagnosis Date Noted  . AKI (acute kidney injury) ([place]) 11/09/2015  . Elevated troponin 11/09/2015  . Hypotension 11/09/2015  . Respiratory failure ([place])   . Acute hepatic failure 11/08/2015  . Drug overdose 11/08/2015  . Fatty infiltration of liver 02/16/2015  . Hepatic fibrosis ([place]) 02/16/2015  . Abnormal serum level of alkaline phosphatase 02/15/2015  . Anemia 02/05/2015  . Vitamin B 12 deficiency 02/05/2015  . OP (osteoporosis) 06/16/2014  . Rheumatoid arteritis 03/02/2014  . Osteoarthritis of left hip 03/02/2014  . Hypothyroidism 03/02/2014  . Rheumatic fever without heart involvement 03/02/2014  . Adult hypothyroidism 03/02/2014  . Arthritis of pelvic region, degenerative 03/02/2014  . Bipolar 1 disorder, depressed ([place]) 02/26/2014  . Bariatric surgery status 11/24/2013  . Affective bipolar disorder ([place]) 11/24/2013  . Bipolar affective disorder ([place]) 11/24/2013  . Rheumatoid arthritis ([place]) 11/24/2013  . Polysubstance (excluding opioids) dependence ([place]) 09/11/2013  . Polysubstance dependence ([place]) 09/11/2013  . Combined drug dependence excluding opioids ([employer]) 09/11/2013  . Arthritis or polyarthritis, rheumatoid ([place]) 09/05/2013  . Leg weakness 09/05/2013  Zadie Deemer Shiela Mayer, PT, DPT   01/11/2016, 6:15 PM [place] MAIN [facility] SERVICES [address] [place], [place], [address] Phone: [phone]   Fax:  [phone]  Name: Jamie Bennett MRN: [id number] Date of Birth: [birth year]

## 2016-01-11 NOTE — Patient Instructions (Addendum)
Provided HEP and reviewed: standing hip abd, SLR, ext, mini squats, heel raises 2x10. Created on [email or web address]

## 2016-01-12 ENCOUNTER — Telehealth: Payer: Self-pay | Admitting: Psychiatry

## 2016-01-12 NOTE — Telephone Encounter (Signed)
CALLED PT. SHE STATES THAT SHE WAS SEEN LAST WEEK. SHE STATES THAT SHE WAS TAKING BUSPAR 10MG  4 TIMES A DAY AND NOW THE RX SHE HAS NOW IS FOR ONLY 2 TIMES A DAY.  SHE WANTS TO KNOW WHY HER MEDICATION WAS CUT IN HALF.

## 2016-01-12 NOTE — Telephone Encounter (Signed)
LOOKED UP NOTES.  LAST SEEN ON  12-23-15 CONFUSED. YOUR CLOSING STATMENT STATES CONTINUE BUSPAR 20MG  BID, BUT THE MEDICATIONS LIST STATES BUSPAR 10MG  TAKE 20MG  4 TIMES A DAY.  NOT SURE WHAT IT SUPPOSE TO BE.   PLEASE CONFIRM THE CORRECT DOSAGE AND SEND IN A NEW RX.

## 2016-01-13 ENCOUNTER — Ambulatory Visit: Payer: Medicare HMO | Admitting: Physical Therapy

## 2016-01-13 ENCOUNTER — Encounter: Payer: Self-pay | Admitting: Physical Therapy

## 2016-01-13 DIAGNOSIS — M6281 Muscle weakness (generalized): Secondary | ICD-10-CM | POA: Diagnosis not present

## 2016-01-13 DIAGNOSIS — R262 Difficulty in walking, not elsewhere classified: Secondary | ICD-10-CM

## 2016-01-13 DIAGNOSIS — M25512 Pain in left shoulder: Secondary | ICD-10-CM

## 2016-01-13 NOTE — Telephone Encounter (Signed)
ACCORDING TO PHARMACY PT WAS GIVEN BUSPAR 10MG  TAKE 2 TABLETS TWICE A DAY. PT ONLY RECEIVED A 15 DAY SUPPLY SO I ASKED THE PHARMAIST TO MAKE CORRECT FOR #120 TO MAKE IT A WHOLE MONTH SUPPLY.

## 2016-01-13 NOTE — Telephone Encounter (Signed)
Patient has not been given a prescription for Risperdal. I think she meant that her BuSpar medication. If patient has been taking BuSpar 20 mg 4 times daily she can continue that. Her last visit was her first visit with me. She was seeing Dr. Jimmye Norman previously. Again if patient has been taking BuSpar 20 mg 4 times daily she can continue that until the next visit. But please verify that the pharmacy that she has been getting this dosage thank you

## 2016-01-13 NOTE — Therapy (Signed)
[place] MAIN [facility] SERVICES [address] [place], [place], [address] Phone: [phone]   Fax:  [phone]  Physical Therapy Treatment  Patient Details  Name: Jamie Bennett MRN: [id number] Date of Birth: 02/07/1961 Referring Provider: Dorthula Perfect Date: 01/13/2016      PT End of Session - 01/13/16 1631    Visit Number 2   Number of Visits 25   Date for PT Re-Evaluation 22-Apr-2016   Authorization Type g codes   Authorization Time Period 2/10   PT Start Time 1618   PT Stop Time 1657   PT Time Calculation (min) 39 min   Activity Tolerance Patient tolerated treatment well   Behavior During Therapy [facility] for tasks assessed/performed      Past Medical History  Diagnosis Date  . Osteoarthritis   . Osteoporosis   . Rheumatoid arthritis ([place])   . Thyroid disease   . Hypotension   . Constipation   . Anemia   . Bipolar 1 disorder ([place])   . Anxiety   . Fibromyalgia     Past Surgical History  Procedure Laterality Date  . Gastric bypass    . Cholecystectomy      There were no vitals filed for this visit.      Subjective Assessment - 01/13/16 1619    Subjective Pt reports she has been doing well. Her headaches have been a little better. Continues to c/o neck and L shoulder pain. She has performed the HEP with no difficulties.   Patient is accompained by: Family member   Pertinent History Pt was hospitalized in April 10th, 2017 for weakness and lethargy, for possible medication overdose. After hospital discharge on April 21st, 2017, pt went to STR at [facility] until the end of May. Pt now presents to therapy for deconditioning and also c/o L neck and shoulder pain since her hospitalization.   How long can you stand comfortably? 5 to 10 minutes   How long can you walk comfortably? limited distances   Diagnostic tests DG L shoulder showed normal findings   Patient Stated Goals to improve strength, balance and walking,  reduce L shoulder pain   Currently in Pain? Yes   Pain Score 3   L neck/shoulder pain   Pain Type Acute pain   Pain Onset More than a month ago      Therex:  Nustep L2 x3 min, L5 x7 minutes with SPM above 85 (with LEs only), L1 x3 minutes for cool down, pt demonstrated SOB, but recovered with deep breathing  Cues for appropriate SPM   B LE standing hip SLR, abd, ext 2x10 with GTB LAQs with GTB 2x10 Mini squats 2x10 Heel raises 2x10 Wall squats 2x10  Cues for proper technique of exercises, slow eccentric contractions to target specific muscles and facility increased muscle building. Therapeutic rest breaks for energy conservation.                           PT Education - 01/13/16 1631    Education provided Yes   Education Details reviewed HEP   Person(s) Educated Patient   Methods Explanation;Demonstration   Comprehension Verbalized understanding;Returned demonstration             PT Long Term Goals - 01/11/16 1801    PT LONG TERM GOAL #1   Title Pt will score >80% on ABC scale for increased functional mobilty and confidence to perform daily tasks within  12 weeks.   Baseline 60% (increased risk of falls)   Time 12   Period Weeks   Status New   PT LONG TERM GOAL #2   Title Pt will score > 1.0 m/s on 10 meter walk test to increase functional mobility and reduce risk of falls within 12 weeks.   Baseline 0.7 m/s   Time 12   Period Weeks   Status New   PT LONG TERM GOAL #3   Title Pt will score <10 seconds on 5 x STS for increased functional mobilty and reduced risk of falls within 12 weeks.   Baseline 13 seconds   Time 12   Period Weeks   Status New   PT LONG TERM GOAL #4   Title Pt will demonstrate B hip and knee strength of 5/5 for increased functional mobility and increased activity tolerance within 12 weeks.   Baseline B hips 3/5 to 3+/5, B knees 4-/5 to 4/5   Time 12   Period Weeks   Status New               Plan -  01/13/16 1638    Clinical Impression Statement Pt able to perform strengthening exercises with increased resistance today. Frequent cues required to perform exercises correctly. She demonstrated appropriate amount of fatigue after working on the [employer] and recovered quickly. Plan to progress strength and endurance exercise as tolerated.   Rehab Potential Good   Clinical Impairments Affecting Rehab Potential multiple comorbidities, obesity   PT Frequency 2x / week   PT Duration 12 weeks   PT Treatment/Interventions Gait training;Stair training;Therapeutic activities;Therapeutic exercise;Balance training;Neuromuscular re-education;Patient/family education;Manual techniques   PT Next Visit Plan assess L shoulder, shoulder strengthening, LE strengthening   PT Home Exercise Plan standing hip SLR, abd, ext, mini squats, heel raises   Consulted and Agree with Plan of Care Patient;Family member/caregiver   Family Member Consulted husband      Patient will benefit from skilled therapeutic intervention in order to improve the following deficits and impairments:  Decreased balance, Decreased endurance, Decreased range of motion, Decreased strength, Difficulty walking, Impaired sensation, Improper body mechanics, Obesity, Pain  Visit Diagnosis: Muscle weakness (generalized)  Difficulty in walking, not elsewhere classified  Pain in left shoulder     Problem List Patient Active Problem List   Diagnosis Date Noted  . AKI (acute kidney injury) ([place]) 11/09/2015  . Elevated troponin 11/09/2015  . Hypotension 11/09/2015  . Respiratory failure ([place])   . Acute hepatic failure 11/08/2015  . Drug overdose 11/08/2015  . Fatty infiltration of liver 02/16/2015  . Hepatic fibrosis ([place]) 02/16/2015  . Abnormal serum level of alkaline phosphatase 02/15/2015  . Anemia 02/05/2015  . Vitamin B 12 deficiency 02/05/2015  . OP (osteoporosis) 06/16/2014  . Rheumatoid arteritis 03/02/2014  . Osteoarthritis of  left hip 03/02/2014  . Hypothyroidism 03/02/2014  . Rheumatic fever without heart involvement 03/02/2014  . Adult hypothyroidism 03/02/2014  . Arthritis of pelvic region, degenerative 03/02/2014  . Bipolar 1 disorder, depressed ([place]) 02/26/2014  . Bariatric surgery status 11/24/2013  . Affective bipolar disorder ([place]) 11/24/2013  . Bipolar affective disorder ([place]) 11/24/2013  . Rheumatoid arthritis ([place]) 11/24/2013  . Polysubstance (excluding opioids) dependence ([place]) 09/11/2013  . Polysubstance dependence ([place]) 09/11/2013  . Combined drug dependence excluding opioids ([place]) 09/11/2013  . Arthritis or polyarthritis, rheumatoid ([place]) 09/05/2013  . Leg weakness 09/05/2013    Neoma Laming, PT, DPT  01/13/2016, 4:58 PM [place]  [place], [place], [address] Phone: [phone]   Fax:  [phone]  Name: Jamie Bennett MRN: [id number] Date of Birth: March 04, 1961

## 2016-01-13 NOTE — Telephone Encounter (Signed)
SPOKE WTIH PATIENT, EXPLAINED THAT RX WAS CORRECTED FOR A WHOLE MONTH SUPPLY INSTEAD OF A 15 DAY SUPPLY.

## 2016-01-18 ENCOUNTER — Encounter: Payer: Self-pay | Admitting: Physical Therapy

## 2016-01-18 DIAGNOSIS — R748 Abnormal levels of other serum enzymes: Secondary | ICD-10-CM | POA: Diagnosis not present

## 2016-01-19 ENCOUNTER — Encounter: Payer: Self-pay | Admitting: Physical Therapy

## 2016-01-19 ENCOUNTER — Ambulatory Visit: Payer: Medicare HMO | Admitting: Physical Therapy

## 2016-01-19 DIAGNOSIS — M6281 Muscle weakness (generalized): Secondary | ICD-10-CM

## 2016-01-19 DIAGNOSIS — R262 Difficulty in walking, not elsewhere classified: Secondary | ICD-10-CM

## 2016-01-19 NOTE — Therapy (Signed)
[place] MAIN [facility] SERVICES [address] [place], [place], [address] Phone: [phone]   Fax:  [phone]  Physical Therapy Treatment  Patient Details  Name: Jamie Bennett MRN: [id number] Date of Birth: 29-Jan-1961 Referring Provider: Dorthula Perfect Date: 01/19/2016      PT End of Session - 01/19/16 1519    Visit Number 3   Number of Visits 25   Date for PT Re-Evaluation Apr 26, 2016   Authorization Type g codes   Authorization Time Period 2/10   PT Start Time 1515   PT Stop Time 1600   PT Time Calculation (min) 45 min   Activity Tolerance Patient tolerated treatment well   Behavior During Therapy [facility] for tasks assessed/performed      Past Medical History  Diagnosis Date  . Osteoarthritis   . Osteoporosis   . Rheumatoid arthritis ([place])   . Thyroid disease   . Hypotension   . Constipation   . Anemia   . Bipolar 1 disorder ([place])   . Anxiety   . Fibromyalgia     Past Surgical History  Procedure Laterality Date  . Gastric bypass    . Cholecystectomy      There were no vitals filed for this visit.      Subjective Assessment - 01/19/16 1518    Subjective Pt reports she has been doing well. Her headaches have been a little better. Continues to c/o neck and L shoulder pain. She has performed the HEP with no difficulties.   Patient is accompained by: Family member   Pertinent History Pt was hospitalized in April 10th, 2017 for weakness and lethargy, for possible medication overdose. After hospital discharge on April 21st, 2017, pt went to STR at [facility] until the end of May. Pt now presents to therapy for deconditioning and also c/o L neck and shoulder pain since her hospitalization.   How long can you stand comfortably? 5 to 10 minutes   How long can you walk comfortably? limited distances   Diagnostic tests DG L shoulder showed normal findings   Patient Stated Goals to improve strength, balance and walking,  reduce L shoulder pain   Currently in Pain? Yes   Pain Score 3    Pain Location Shoulder   Pain Onset More than a month ago     Therapeutic exercise;  standing hip abd with YTB x 20  side stepping left and right in parallel bars 10 feet x 3 Eccentric step downs from 6 inch stool step ups from floor to 6 inch stool x 20 bilateral sit to stand x 10 marching in parallel bars x 20 Stepping up and down steps x 10  Heel raises x 20  Nu-step x 10 minutes  UBE x 5 minutes Patient continues to have left shoulder pain and neck pain that gets worse with overhead movements. Pt needed mod cueing with CGA while performing stability tasks.                            PT Education - 01/19/16 1518    Education provided Yes   Education Details HEP   Person(s) Educated Patient   Methods Explanation   Comprehension Verbalized understanding             PT Long Term Goals - 01/11/16 1801    PT LONG TERM GOAL #1   Title Pt will score >80% on ABC scale for increased functional  mobilty and confidence to perform daily tasks within 12 weeks.   Baseline 60% (increased risk of falls)   Time 12   Period Weeks   Status New   PT LONG TERM GOAL #2   Title Pt will score > 1.0 m/s on 10 meter walk test to increase functional mobility and reduce risk of falls within 12 weeks.   Baseline 0.7 m/s   Time 12   Period Weeks   Status New   PT LONG TERM GOAL #3   Title Pt will score <10 seconds on 5 x STS for increased functional mobilty and reduced risk of falls within 12 weeks.   Baseline 13 seconds   Time 12   Period Weeks   Status New   PT LONG TERM GOAL #4   Title Pt will demonstrate B hip and knee strength of 5/5 for increased functional mobility and increased activity tolerance within 12 weeks.   Baseline B hips 3/5 to 3+/5, B knees 4-/5 to 4/5   Time 12   Period Weeks   Status New               Plan - 01/19/16 1519    Clinical Impression Statement Patient was  seen for beginning exercises for strengthening of LE's and improve overall mobility.    Rehab Potential Good   Clinical Impairments Affecting Rehab Potential multiple comorbidities, obesity   PT Frequency 2x / week   PT Duration 12 weeks   PT Treatment/Interventions Gait training;Stair training;Therapeutic activities;Therapeutic exercise;Balance training;Neuromuscular re-education;Patient/family education;Manual techniques   PT Next Visit Plan assess L shoulder, shoulder strengthening, LE strengthening   PT Home Exercise Plan standing hip SLR, abd, ext, mini squats, heel raises   Consulted and Agree with Plan of Care Patient;Family member/caregiver   Family Member Consulted husband      Patient will benefit from skilled therapeutic intervention in order to improve the following deficits and impairments:  Decreased balance, Decreased endurance, Decreased range of motion, Decreased strength, Difficulty walking, Impaired sensation, Improper body mechanics, Obesity, Pain  Visit Diagnosis: Muscle weakness (generalized)  Difficulty in walking, not elsewhere classified     Problem List Patient Active Problem List   Diagnosis Date Noted  . AKI (acute kidney injury) ([place]) 11/09/2015  . Elevated troponin 11/09/2015  . Hypotension 11/09/2015  . Respiratory failure ([place])   . Acute hepatic failure 11/08/2015  . Drug overdose 11/08/2015  . Fatty infiltration of liver 02/16/2015  . Hepatic fibrosis ([place]) 02/16/2015  . Abnormal serum level of alkaline phosphatase 02/15/2015  . Anemia 02/05/2015  . Vitamin B 12 deficiency 02/05/2015  . OP (osteoporosis) 06/16/2014  . Rheumatoid arteritis 03/02/2014  . Osteoarthritis of left hip 03/02/2014  . Hypothyroidism 03/02/2014  . Rheumatic fever without heart involvement 03/02/2014  . Adult hypothyroidism 03/02/2014  . Arthritis of pelvic region, degenerative 03/02/2014  . Bipolar 1 disorder, depressed ([place]) 02/26/2014  . Bariatric surgery status  11/24/2013  . Affective bipolar disorder ([place]) 11/24/2013  . Bipolar affective disorder ([place]) 11/24/2013  . Rheumatoid arthritis ([place]) 11/24/2013  . Polysubstance (excluding opioids) dependence ([place]) 09/11/2013  . Polysubstance dependence ([place]) 09/11/2013  . Combined drug dependence excluding opioids ([place]) 09/11/2013  . Arthritis or polyarthritis, rheumatoid ([place]) 09/05/2013  . Leg weakness 09/05/2013   Alanson Puls, PT, DPT [place], [place] S 01/19/2016, 3:37 PM  [place] MAIN [facility] SERVICES [address] [place], [place], [address] Phone: [phone]   Fax:  [phone]  Name: Jamie Severt  Bennett MRN: [id number] Date of Birth: 11/11/1960

## 2016-01-20 ENCOUNTER — Ambulatory Visit: Payer: Medicare HMO | Admitting: Physical Therapy

## 2016-01-20 ENCOUNTER — Encounter: Payer: Self-pay | Admitting: Physical Therapy

## 2016-01-20 DIAGNOSIS — R262 Difficulty in walking, not elsewhere classified: Secondary | ICD-10-CM

## 2016-01-20 DIAGNOSIS — M6281 Muscle weakness (generalized): Secondary | ICD-10-CM

## 2016-01-20 DIAGNOSIS — M25512 Pain in left shoulder: Secondary | ICD-10-CM

## 2016-01-20 NOTE — Therapy (Signed)
[place] MAIN [facility] SERVICES [address] [place], [place], [address] Phone: [phone]   Fax:  [phone]  Physical Therapy Treatment  Patient Details  Name: Jamie Bennett MRN: [id number] Date of Birth: Jan 20, 1961 Referring Provider: Dorthula Perfect Date: 01/20/2016      PT End of Session - 01/20/16 1547    Visit Number 4   Number of Visits 25   Date for PT Re-Evaluation 04/19/2016   Authorization Type g codes   Authorization Time Period 2/10   PT Start Time 1535   PT Stop Time 1620   PT Time Calculation (min) 45 min   Activity Tolerance Patient tolerated treatment well;Patient limited by fatigue   Behavior During Therapy [facility] for tasks assessed/performed      Past Medical History  Diagnosis Date  . Osteoarthritis   . Osteoporosis   . Rheumatoid arthritis ([place])   . Thyroid disease   . Hypotension   . Constipation   . Anemia   . Bipolar 1 disorder ([place])   . Anxiety   . Fibromyalgia     Past Surgical History  Procedure Laterality Date  . Gastric bypass    . Cholecystectomy      There were no vitals filed for this visit.      Subjective Assessment - 01/20/16 1546    Subjective Pt reports she has been doing well, she is a little sore today from her work out.    Patient is accompained by: Family member   Pertinent History Pt was hospitalized in April 10th, 2017 for weakness and lethargy, for possible medication overdose. After hospital discharge on April 21st, 2017, pt went to STR at [facility] until the end of May. Pt now presents to therapy for deconditioning and also c/o L neck and shoulder pain since her hospitalization.   How long can you stand comfortably? 5 to 10 minutes   How long can you walk comfortably? limited distances   Diagnostic tests DG L shoulder showed normal findings   Patient Stated Goals to improve strength, balance and walking, reduce L shoulder pain   Pain Onset More than a month ago     Therapeutic exercise:  standing hip abd with YTB x 20  side stepping left and right in parallel bars 10 feet x 3 Step down eccentric x 10 x 2 step ups from floor to 6 inch stool x 20 bilateral sit to stand x 10 marching in parallel bars x 20 Leg press 90 lbs x 20 x 3  TM walking x 5 minutes elevation 1 x 1.5 miles / hour Patient needs occasional verbal cueing to improve posture and cueing to correctly perform exercises slowly, holding at end of range to increase motor firing of desired muscle to encourage fatigue.                               PT Education - 01/20/16 1547    Education provided Yes   Education Details HEP   Person(s) Educated Patient   Methods Explanation   Comprehension Verbalized understanding             PT Long Term Goals - 01/11/16 1801    PT LONG TERM GOAL #1   Title Pt will score >80% on ABC scale for increased functional mobilty and confidence to perform daily tasks within 12 weeks.   Baseline 60% (increased risk of falls)   Time  12   Period Weeks   Status New   PT LONG TERM GOAL #2   Title Pt will score > 1.0 m/s on 10 meter walk test to increase functional mobility and reduce risk of falls within 12 weeks.   Baseline 0.7 m/s   Time 12   Period Weeks   Status New   PT LONG TERM GOAL #3   Title Pt will score <10 seconds on 5 x STS for increased functional mobilty and reduced risk of falls within 12 weeks.   Baseline 13 seconds   Time 12   Period Weeks   Status New   PT LONG TERM GOAL #4   Title Pt will demonstrate B hip and knee strength of 5/5 for increased functional mobility and increased activity tolerance within 12 weeks.   Baseline B hips 3/5 to 3+/5, B knees 4-/5 to 4/5   Time 12   Period Weeks   Status New               Plan - 01/20/16 1548    Clinical Impression Statement Patients performance improves with practice and she uses UE to help support and for balance.   Rehab Potential Good    Clinical Impairments Affecting Rehab Potential multiple comorbidities, obesity   PT Frequency 2x / week   PT Duration 12 weeks   PT Treatment/Interventions Gait training;Stair training;Therapeutic activities;Therapeutic exercise;Balance training;Neuromuscular re-education;Patient/family education;Manual techniques   PT Next Visit Plan assess L shoulder, shoulder strengthening, LE strengthening   PT Home Exercise Plan standing hip SLR, abd, ext, mini squats, heel raises   Consulted and Agree with Plan of Care Patient;Family member/caregiver   Family Member Consulted husband      Patient will benefit from skilled therapeutic intervention in order to improve the following deficits and impairments:  Decreased balance, Decreased endurance, Decreased range of motion, Decreased strength, Difficulty walking, Impaired sensation, Improper body mechanics, Obesity, Pain  Visit Diagnosis: Muscle weakness (generalized)  Difficulty in walking, not elsewhere classified  Pain in left shoulder     Problem List Patient Active Problem List   Diagnosis Date Noted  . AKI (acute kidney injury) ([place]) 11/09/2015  . Elevated troponin 11/09/2015  . Hypotension 11/09/2015  . Respiratory failure ([place])   . Acute hepatic failure 11/08/2015  . Drug overdose 11/08/2015  . Fatty infiltration of liver 02/16/2015  . Hepatic fibrosis ([place]) 02/16/2015  . Abnormal serum level of alkaline phosphatase 02/15/2015  . Anemia 02/05/2015  . Vitamin B 12 deficiency 02/05/2015  . OP (osteoporosis) 06/16/2014  . Rheumatoid arteritis 03/02/2014  . Osteoarthritis of left hip 03/02/2014  . Hypothyroidism 03/02/2014  . Rheumatic fever without heart involvement 03/02/2014  . Adult hypothyroidism 03/02/2014  . Arthritis of pelvic region, degenerative 03/02/2014  . Bipolar 1 disorder, depressed ([place]) 02/26/2014  . Bariatric surgery status 11/24/2013  . Affective bipolar disorder ([place]) 11/24/2013  . Bipolar affective disorder  ([place]) 11/24/2013  . Rheumatoid arthritis ([place]) 11/24/2013  . Polysubstance (excluding opioids) dependence ([place]) 09/11/2013  . Polysubstance dependence ([place]) 09/11/2013  . Combined drug dependence excluding opioids ([place]) 09/11/2013  . Arthritis or polyarthritis, rheumatoid ([place]) 09/05/2013  . Leg weakness 09/05/2013   Alanson Puls, PT, DPT [place], [place] S 01/20/2016, 3:50 PM  [place] MAIN [facility] SERVICES [address] [place], [place], [address] Phone: [phone]   Fax:  [phone]  Name: Jamie Bennett MRN: [id number] Date of Birth: 12-14-1960

## 2016-01-21 ENCOUNTER — Inpatient Hospital Stay: Payer: Medicare HMO | Attending: Hematology and Oncology

## 2016-01-21 DIAGNOSIS — Z09 Encounter for follow-up examination after completed treatment for conditions other than malignant neoplasm: Secondary | ICD-10-CM | POA: Diagnosis not present

## 2016-01-21 DIAGNOSIS — D509 Iron deficiency anemia, unspecified: Secondary | ICD-10-CM | POA: Diagnosis not present

## 2016-01-21 DIAGNOSIS — M7552 Bursitis of left shoulder: Secondary | ICD-10-CM | POA: Diagnosis not present

## 2016-01-21 DIAGNOSIS — M0579 Rheumatoid arthritis with rheumatoid factor of multiple sites without organ or systems involvement: Secondary | ICD-10-CM | POA: Diagnosis not present

## 2016-01-21 DIAGNOSIS — E538 Deficiency of other specified B group vitamins: Secondary | ICD-10-CM

## 2016-01-21 MED ORDER — CYANOCOBALAMIN 1000 MCG/ML IJ SOLN
1000.0000 ug | Freq: Once | INTRAMUSCULAR | Status: AC
  Administered 2016-01-21: 1000 ug via INTRAMUSCULAR
  Filled 2016-01-21: qty 1

## 2016-01-25 ENCOUNTER — Encounter: Payer: Self-pay | Admitting: Physical Therapy

## 2016-01-25 ENCOUNTER — Ambulatory Visit: Payer: Medicare HMO | Admitting: Physical Therapy

## 2016-01-25 DIAGNOSIS — R262 Difficulty in walking, not elsewhere classified: Secondary | ICD-10-CM

## 2016-01-25 DIAGNOSIS — M25512 Pain in left shoulder: Secondary | ICD-10-CM

## 2016-01-25 DIAGNOSIS — M6281 Muscle weakness (generalized): Secondary | ICD-10-CM | POA: Diagnosis not present

## 2016-01-25 NOTE — Therapy (Addendum)
[facility] MAIN [facility] SERVICES [address] [place], [place], [address] Phone: [phone]   Fax:  [phone]  Physical Therapy Treatment  Patient Details  Name: Jamie Bennett MRN: [id number] Date of Birth: [birth year] Referring Provider: Gayla Medicus Date: 01/25/2016      PT End of Session - 01/25/16 1010    Visit Number 5   Number of Visits 25   Date for PT Re-Evaluation Apr 28, 2016   Authorization Type g codes   Authorization Time Period 2/10   PT Start Time 1000   PT Stop Time 1030   PT Time Calculation (min) 30 min   Activity Tolerance Patient tolerated treatment well;Patient limited by fatigue   Behavior During Therapy [facility] for tasks assessed/performed      Past Medical History  Diagnosis Date  . Osteoarthritis   . Osteoporosis   . Rheumatoid arthritis ([facility])   . Thyroid disease   . Hypotension   . Constipation   . Anemia   . Bipolar 1 disorder ([facility])   . Anxiety   . Fibromyalgia     Past Surgical History  Procedure Laterality Date  . Gastric bypass    . Cholecystectomy      There were no vitals filed for this visit.      Subjective Assessment - 01/25/16 1008    Subjective Pt reports she has been doing well, she had a cortisone shot in her shoulder and it is feeliing well.    Patient is accompained by: Family member   Pertinent History Pt was hospitalized in April 10th, 2017 for weakness and lethargy, for possible medication overdose. After hospital discharge on April 21st, 2017, pt went to STR at [facility] until the end of May. Pt now presents to therapy for deconditioning and also c/o L neck and shoulder pain since her hospitalization.   How long can you stand comfortably? 5 to 10 minutes   How long can you walk comfortably? limited distances   Diagnostic tests DG L shoulder showed normal findings   Patient Stated Goals to improve strength, balance and walking, reduce L shoulder pain   Currently in  Pain? Yes   Pain Score 3    Pain Location Knee   Pain Onset More than a month ago      THER-EX Standing exercises with RTB BLE : Marching 2 x 10; SLR 2 x 10; Abduction 2 x 10; Extension 2 x 10; Heel raises 2 x 10; Stepping over bolster x 10 left and right  Eccentric step downs x 10 BLE Squats x 10 with 5 sec hold Heel raises x 10 x 2  Resisted side-steeping RTB 4 lengths x 2; Standing mini squats 2 x 10 with RTB around knees to encourage abduction; Sit to stand without UE support 2 x 10; Step-ups to 6" step x 10 bilateral; Quantum leg press 100 lbs x 20 x 3  Mod verbal cues used throughout with increased in postural sway and LOB most seen with narrow base of support and while on uneven surfaces. Continues to have balance deficits typical with diagnosis. Patient performs intermediate level exercises without pain behaviors and needs verbal cuing for postural alignment and head positioning.                          PT Education - 01/25/16 1009    Education provided Yes   Education Details HEP   Person(s) Educated Patient   Methods  Explanation   Comprehension Verbalized understanding             PT Long Term Goals - 01/11/16 1801    PT LONG TERM GOAL #1   Title Pt will score >80% on ABC scale for increased functional mobilty and confidence to perform daily tasks within 12 weeks.   Baseline 60% (increased risk of falls)   Time 12   Period Weeks   Status New   PT LONG TERM GOAL #2   Title Pt will score > 1.0 m/s on 10 meter walk test to increase functional mobility and reduce risk of falls within 12 weeks.   Baseline 0.7 m/s   Time 12   Period Weeks   Status New   PT LONG TERM GOAL #3   Title Pt will score <10 seconds on 5 x STS for increased functional mobilty and reduced risk of falls within 12 weeks.   Baseline 13 seconds   Time 12   Period Weeks   Status New   PT LONG TERM GOAL #4   Title Pt will demonstrate B hip and knee strength of  5/5 for increased functional mobility and increased activity tolerance within 12 weeks.   Baseline B hips 3/5 to 3+/5, B knees 4-/5 to 4/5   Time 12   Period Weeks   Status New               Plan - 01/25/16 1010    Clinical Impression Statement PT provided min verbal instruction to improve set up, proper use of LE, and improved posture and gait mechanics. Patient responded moderately to instruction   Rehab Potential Good   Clinical Impairments Affecting Rehab Potential multiple comorbidities, obesity   PT Frequency 2x / week   PT Duration 12 weeks   PT Treatment/Interventions Gait training;Stair training;Therapeutic activities;Therapeutic exercise;Balance training;Neuromuscular re-education;Patient/family education;Manual techniques   PT Next Visit Plan assess L shoulder, shoulder strengthening, LE strengthening   PT Home Exercise Plan standing hip SLR, abd, ext, mini squats, heel raises   Consulted and Agree with Plan of Care Patient;Family member/caregiver   Family Member Consulted husband      Patient will benefit from skilled therapeutic intervention in order to improve the following deficits and impairments:  Decreased balance, Decreased endurance, Decreased range of motion, Decreased strength, Difficulty walking, Impaired sensation, Improper body mechanics, Obesity, Pain  Visit Diagnosis: Muscle weakness (generalized)  Difficulty in walking, not elsewhere classified  Pain in left shoulder     Problem List Patient Active Problem List   Diagnosis Date Noted  . AKI (acute kidney injury) ([facility]) 11/09/2015  . Elevated troponin 11/09/2015  . Hypotension 11/09/2015  . Respiratory failure ([facility])   . Acute hepatic failure 11/08/2015  . Drug overdose 11/08/2015  . Fatty infiltration of liver 02/16/2015  . Hepatic fibrosis ([facility]) 02/16/2015  . Abnormal serum level of alkaline phosphatase 02/15/2015  . Anemia 02/05/2015  . Vitamin B 12 deficiency 02/05/2015  . OP  (osteoporosis) 06/16/2014  . Rheumatoid arteritis 03/02/2014  . Osteoarthritis of left hip 03/02/2014  . Hypothyroidism 03/02/2014  . Rheumatic fever without heart involvement 03/02/2014  . Adult hypothyroidism 03/02/2014  . Arthritis of pelvic region, degenerative 03/02/2014  . Bipolar 1 disorder, depressed (H[facility]) 02/26/2014  . Bariatric surgery status 11/24/2013  . Affective bipolar disorder (H[facility]) 11/24/2013  . Bipolar affective disorder (HCC) 11/24/2013  . Rheumatoid arthritis (HCC) 11/24/2013  . Polysubstance (excluding opioids) dependence (HCC) 09/11/2013  . Polysubstance dependence (HCC) 09/11/2013  . Combined  drug dependence excluding opioids ([place]) 09/11/2013  . Arthritis or polyarthritis, rheumatoid ([place]) 09/05/2013  . Leg weakness 09/05/2013   Alanson Puls, PT, DPT [place], [place] 01/25/2016, 11:14 AM  [place] MAIN [facility] SERVICES [address] [place], [place], [address] Phone: [phone]   Fax:  [phone]  Name: Jamie Bennett MRN: [id number] Date of Birth: 08-27-1960

## 2016-01-26 DIAGNOSIS — R55 Syncope and collapse: Secondary | ICD-10-CM | POA: Diagnosis not present

## 2016-01-27 ENCOUNTER — Emergency Department: Payer: Medicare HMO

## 2016-01-27 ENCOUNTER — Emergency Department
Admission: EM | Admit: 2016-01-27 | Discharge: 2016-01-27 | Disposition: A | Discharge status: Home / Self Care | Payer: Medicare HMO | Attending: Emergency Medicine | Admitting: Emergency Medicine

## 2016-01-27 ENCOUNTER — Encounter: Payer: Self-pay | Admitting: Emergency Medicine

## 2016-01-27 ENCOUNTER — Ambulatory Visit: Payer: Medicare HMO | Admitting: Physical Therapy

## 2016-01-27 DIAGNOSIS — M069 Rheumatoid arthritis, unspecified: Secondary | ICD-10-CM | POA: Diagnosis not present

## 2016-01-27 DIAGNOSIS — Y999 Unspecified external cause status: Secondary | ICD-10-CM | POA: Insufficient documentation

## 2016-01-27 DIAGNOSIS — M25572 Pain in left ankle and joints of left foot: Secondary | ICD-10-CM | POA: Diagnosis not present

## 2016-01-27 DIAGNOSIS — E039 Hypothyroidism, unspecified: Secondary | ICD-10-CM | POA: Insufficient documentation

## 2016-01-27 DIAGNOSIS — Z79899 Other long term (current) drug therapy: Secondary | ICD-10-CM | POA: Diagnosis not present

## 2016-01-27 DIAGNOSIS — R69 Illness, unspecified: Secondary | ICD-10-CM | POA: Diagnosis not present

## 2016-01-27 DIAGNOSIS — S93602A Unspecified sprain of left foot, initial encounter: Secondary | ICD-10-CM | POA: Diagnosis not present

## 2016-01-27 DIAGNOSIS — F319 Bipolar disorder, unspecified: Secondary | ICD-10-CM | POA: Insufficient documentation

## 2016-01-27 DIAGNOSIS — M1612 Unilateral primary osteoarthritis, left hip: Secondary | ICD-10-CM | POA: Insufficient documentation

## 2016-01-27 DIAGNOSIS — S99922A Unspecified injury of left foot, initial encounter: Secondary | ICD-10-CM | POA: Diagnosis not present

## 2016-01-27 DIAGNOSIS — Y939 Activity, unspecified: Secondary | ICD-10-CM | POA: Diagnosis not present

## 2016-01-27 DIAGNOSIS — Y9289 Other specified places as the place of occurrence of the external cause: Secondary | ICD-10-CM | POA: Diagnosis not present

## 2016-01-27 DIAGNOSIS — X501XXA Overexertion from prolonged static or awkward postures, initial encounter: Secondary | ICD-10-CM | POA: Diagnosis not present

## 2016-01-27 DIAGNOSIS — M79672 Pain in left foot: Secondary | ICD-10-CM | POA: Diagnosis not present

## 2016-01-27 DIAGNOSIS — S99912A Unspecified injury of left ankle, initial encounter: Secondary | ICD-10-CM | POA: Diagnosis not present

## 2016-01-27 MED ORDER — OXYCODONE-ACETAMINOPHEN 5-325 MG PO TABS
1.0000 | ORAL_TABLET | Freq: Once | ORAL | Status: AC
  Administered 2016-01-27: 1 via ORAL
  Filled 2016-01-27: qty 1

## 2016-01-27 MED ORDER — FAMOTIDINE 20 MG PO TABS
ORAL_TABLET | ORAL | Status: AC
  Filled 2016-01-27: qty 2

## 2016-01-27 MED ORDER — GI COCKTAIL ~~LOC~~
ORAL | Status: AC
Start: 2016-01-27 — End: 2016-01-28
  Filled 2016-01-27: qty 30

## 2016-01-27 MED ORDER — OXYCODONE-ACETAMINOPHEN 5-325 MG PO TABS: 1.0000 | ORAL_TABLET | ORAL | Status: DC | PRN

## 2016-01-27 NOTE — Discharge Instructions (Signed)

## 2016-01-27 NOTE — ED Notes (Addendum)
Pt presents to ED with left foot pain that radiates slightly into the ankle. Pt states she was getting up off the toilet and her foot had fallen asleep and gave way when she attempted to stand. Now c/o severe pain to the top and side of her left foot. Pt reports she is not able to ambulate at all without assistance.

## 2016-01-27 NOTE — ED Notes (Signed)
MD at bedside. 

## 2016-01-27 NOTE — ED Notes (Signed)
Discharge instructions reviewed with patient. Patient verbalized understanding. Patient taken taken out to car via wheelchair and helped into car without difficulty.

## 2016-01-29 NOTE — ED Provider Notes (Signed)
[facility] Emergency Department Provider Note  ____________________________________________  Time seen: 4:15 AM  I have reviewed the triage vital signs and the nursing notes.   HISTORY  Chief Complaint Foot Pain      HPI Jamie Bennett is a 55 y.o. female presents with left ankle pain. Patient states that after sitting on the toilet for a long time  she states that "it felt like my foot fell asleep". Patient states that when she attempted to stand she fell falling on top of her left foot. Patient admits to 8 out of 10 left foot pain.    Past Medical History  Diagnosis Date  . Osteoarthritis   . Osteoporosis   . Rheumatoid arthritis ([place])   . Thyroid disease   . Hypotension   . Constipation   . Anemia   . Bipolar 1 disorder ([place])   . Anxiety   . Fibromyalgia     Patient Active Problem List   Diagnosis Date Noted  . AKI (acute kidney injury) ([place]) 11/09/2015  . Elevated troponin 11/09/2015  . Hypotension 11/09/2015  . Respiratory failure ([place])   . Acute hepatic failure 11/08/2015  . Drug overdose 11/08/2015  . Fatty infiltration of liver 02/16/2015  . Hepatic fibrosis ([place]) 02/16/2015  . Abnormal serum level of alkaline phosphatase 02/15/2015  . Anemia 02/05/2015  . Vitamin B 12 deficiency 02/05/2015  . OP (osteoporosis) 06/16/2014  . Rheumatoid arteritis 03/02/2014  . Osteoarthritis of left hip 03/02/2014  . Hypothyroidism 03/02/2014  . Rheumatic fever without heart involvement 03/02/2014  . Adult hypothyroidism 03/02/2014  . Arthritis of pelvic region, degenerative 03/02/2014  . Bipolar 1 disorder, depressed ([place]) 02/26/2014  . Bariatric surgery status 11/24/2013  . Affective bipolar disorder ([place]) 11/24/2013  . Bipolar affective disorder ([place]) 11/24/2013  . Rheumatoid arthritis ([place]) 11/24/2013  . Polysubstance (excluding opioids) dependence ([place]) 09/11/2013  . Polysubstance dependence ([place]) 09/11/2013  . Combined drug  dependence excluding opioids ([place]) 09/11/2013  . Arthritis or polyarthritis, rheumatoid ([place]) 09/05/2013  . Leg weakness 09/05/2013    Past Surgical History  Procedure Laterality Date  . Gastric bypass    . Cholecystectomy      Current Outpatient Rx  Name  Route  Sig  Dispense  Refill  . Biotin 5000 MCG TABS   Oral   Take 5,000 mcg by mouth daily.          . busPIRone (BUSPAR) 10 MG tablet   Oral   Take 2 tablets (20 mg total) by mouth 2 (two) times daily.   60 tablet   2   . Cholecalciferol (HM VITAMIN D3) 4000 units CAPS   Oral   Take 4,000 capsules by mouth daily.         . cyanocobalamin (,VITAMIN B-12,) 1000 MCG/ML injection   Intramuscular   Inject 1,000 mcg into the muscle every 30 (thirty) days.         Marland Kitchen esomeprazole (NEXIUM) 40 MG capsule   Oral   Take 1 capsule (40 mg total) by mouth daily at 12 noon.         . Fiber, Guar Gum, CHEW   Oral   Chew 10 mg by mouth daily.         . hydroxychloroquine (PLAQUENIL) 200 MG tablet   Oral   Take 200 mg by mouth See admin instructions. 200 mg twice a day Mon-Friday only         . lamoTRIgine (LAMICTAL) 150 MG tablet  Oral   Take 1 tablet (150 mg total) by mouth 2 (two) times daily.   60 tablet   2   . levothyroxine (SYNTHROID, LEVOTHROID) 112 MCG tablet   Oral   Take 112 mcg by mouth daily.         . Magnesium 400 MG CAPS   Oral   Take 400 mg by mouth.         . Multiple Vitamin (MULTIVITAMIN WITH MINERALS) TABS tablet   Oral   Take 1 tablet by mouth daily.         Marland Kitchen oxyCODONE-acetaminophen (PERCOCET/ROXICET) 5-325 MG tablet   Oral   Take 1 tablet by mouth every 4 (four) hours as needed for severe pain.   12 tablet   0   . tiZANidine (ZANAFLEX) 4 MG capsule   Oral   Take 4 mg by mouth 2 (two) times daily as needed for muscle spasms.          . traZODone (DESYREL) 100 MG tablet      Take 1 tablet at night as needed for insomnia   30 tablet   2   . venlafaxine (EFFEXOR)  75 MG tablet   Oral   Take 3 tablets (225 mg total) by mouth every morning.   90 tablet   2   . zoledronic acid (RECLAST) 5 MG/100ML SOLN injection   Intravenous   Inject 5 mg into the vein See admin instructions. Patient takes yearly           Allergies No known drug allergies  Family History  Problem Relation Age of Onset  . Depression Mother   . Dementia Mother   . Heart disease Mother   . Parkinson's disease Father     Social History Social History  Substance Use Topics  . Smoking status: Never Smoker   . Smokeless tobacco: Never Used  . Alcohol Use: No    Review of Systems  Constitutional: Negative for fever. Eyes: Negative for visual changes. ENT: Negative for sore throat. Cardiovascular: Negative for chest pain. Respiratory: Negative for shortness of breath. Gastrointestinal: Negative for abdominal pain, vomiting and diarrhea. Genitourinary: Negative for dysuria. Musculoskeletal: Negative for back pain.Positive for left foot pain Skin: Negative for rash. Neurological: Negative for headaches, focal weakness or numbness.   10-point ROS otherwise negative.  ____________________________________________   PHYSICAL EXAM:  VITAL SIGNS: ED Triage Vitals  Enc Vitals Group     BP 01/27/16 0344 132/82 mmHg     Pulse Rate 01/27/16 0344 76     Resp 01/27/16 0344 18     Temp 01/27/16 0344 97.7 F (36.5 C)     Temp Source 01/27/16 0344 Oral     SpO2 01/27/16 0344 98 %     Weight 01/27/16 0344 250 lb (113.399 kg)     Height 01/27/16 0344 5\' 6"  (1.676 m)     Head Cir --      Peak Flow --      Pain Score 01/27/16 0347 6     Pain Loc --      Pain Edu? --      Excl. in [place]? --      Constitutional: Alert and oriented. Well appearing and in no distress. Eyes: Conjunctivae are normal. PERRL. Normal extraocular movements. ENT   Head: Normocephalic and atraumatic.   Nose: No congestion/rhinnorhea.   Mouth/Throat: Mucous membranes are moist.    Neck: No stridor. Hematological/Lymphatic/Immunilogical: No cervical lymphadenopathy. Cardiovascular: Normal rate, regular rhythm. Normal and  symmetric distal pulses are present in all extremities. No murmurs, rubs, or gallops. Respiratory: Normal respiratory effort without tachypnea nor retractions. Breath sounds are clear and equal bilaterally. No wheezes/rales/rhonchi. Gastrointestinal: Soft and nontender. No distention. There is no CVA tenderness. Genitourinary: deferred Musculoskeletal: Pain with palpation dorsal aspect of left foot. Pain with active and passive range of motion for an ankle. Neurologic:  Normal speech and language. No gross focal neurologic deficits are appreciated. Speech is normal.  Skin:  Skin is warm, dry and intact. No rash noted.   RADIOLOGY     DG Ankle Complete Left (Final result) Result time: 01/27/16 05:13:47   Final result by Rad Results In Interface (01/27/16 05:13:47)   Narrative:   CLINICAL DATA: Severe pain after trauma tonight.  EXAM: LEFT ANKLE COMPLETE - 3+ VIEW  COMPARISON: None.  FINDINGS: There is no evidence of fracture, dislocation, or joint effusion. There is no evidence of arthropathy or other focal bone abnormality. Soft tissues are unremarkable.  IMPRESSION: Negative.   Electronically Signed By: Andreas Newport M.D. On: 01/27/2016 05:13          DG Foot Complete Left (Final result) Result time: 01/27/16 04:16:19   Final result by Rad Results In Interface (01/27/16 04:16:19)   Narrative:   CLINICAL DATA: Pain after trauma 2 hours ago  EXAM: LEFT FOOT - COMPLETE 3+ VIEW  COMPARISON: None.  FINDINGS: There is no evidence of fracture or dislocation. There is no evidence of arthropathy or other focal bone abnormality. Soft tissues are unremarkable.  IMPRESSION: Negative.   Electronically Signed By: Andreas Newport M.D. On: 01/27/2016 04:16      ____________________________________________    Procedures     INITIAL IMPRESSION / ASSESSMENT AND PLAN / ED COURSE  Pertinent labs & imaging results that were available during my care of the patient were reviewed by me and considered in my medical decision making (see chart for details).    ____________________________________________   FINAL CLINICAL IMPRESSION(S) / ED DIAGNOSES  Final diagnoses:  Foot sprain, left, initial encounter      Gregor Hams, MD 01/29/16 [phone]

## 2016-01-31 ENCOUNTER — Encounter: Payer: Medicare HMO | Admitting: Physical Therapy

## 2016-02-02 ENCOUNTER — Encounter: Payer: Medicare HMO | Admitting: Physical Therapy

## 2016-02-02 DIAGNOSIS — R55 Syncope and collapse: Secondary | ICD-10-CM | POA: Diagnosis not present

## 2016-02-07 ENCOUNTER — Encounter: Payer: Medicare HMO | Admitting: Physical Therapy

## 2016-02-09 ENCOUNTER — Ambulatory Visit: Payer: Medicare HMO | Admitting: Physical Therapy

## 2016-02-09 DIAGNOSIS — M659 Synovitis and tenosynovitis, unspecified: Secondary | ICD-10-CM | POA: Diagnosis not present

## 2016-02-09 DIAGNOSIS — S93602A Unspecified sprain of left foot, initial encounter: Secondary | ICD-10-CM | POA: Diagnosis not present

## 2016-02-09 DIAGNOSIS — M79672 Pain in left foot: Secondary | ICD-10-CM | POA: Diagnosis not present

## 2016-02-17 DIAGNOSIS — I6529 Occlusion and stenosis of unspecified carotid artery: Secondary | ICD-10-CM | POA: Diagnosis not present

## 2016-02-17 DIAGNOSIS — R55 Syncope and collapse: Secondary | ICD-10-CM | POA: Diagnosis not present

## 2016-02-17 DIAGNOSIS — E039 Hypothyroidism, unspecified: Secondary | ICD-10-CM | POA: Diagnosis not present

## 2016-02-17 DIAGNOSIS — M791 Myalgia: Secondary | ICD-10-CM | POA: Diagnosis not present

## 2016-02-17 DIAGNOSIS — M79672 Pain in left foot: Secondary | ICD-10-CM | POA: Diagnosis not present

## 2016-02-18 ENCOUNTER — Other Ambulatory Visit: Payer: Self-pay | Admitting: Nurse Practitioner

## 2016-02-18 ENCOUNTER — Inpatient Hospital Stay: Payer: Medicare HMO | Attending: Hematology and Oncology

## 2016-02-18 DIAGNOSIS — Z79899 Other long term (current) drug therapy: Secondary | ICD-10-CM | POA: Diagnosis not present

## 2016-02-18 DIAGNOSIS — D509 Iron deficiency anemia, unspecified: Secondary | ICD-10-CM | POA: Diagnosis not present

## 2016-02-18 DIAGNOSIS — S93602A Unspecified sprain of left foot, initial encounter: Secondary | ICD-10-CM | POA: Diagnosis not present

## 2016-02-18 DIAGNOSIS — I6523 Occlusion and stenosis of bilateral carotid arteries: Secondary | ICD-10-CM

## 2016-02-18 DIAGNOSIS — M79672 Pain in left foot: Secondary | ICD-10-CM | POA: Diagnosis not present

## 2016-02-18 DIAGNOSIS — E538 Deficiency of other specified B group vitamins: Secondary | ICD-10-CM | POA: Diagnosis not present

## 2016-02-18 DIAGNOSIS — S93601D Unspecified sprain of right foot, subsequent encounter: Secondary | ICD-10-CM | POA: Diagnosis not present

## 2016-02-18 DIAGNOSIS — M659 Synovitis and tenosynovitis, unspecified: Secondary | ICD-10-CM | POA: Diagnosis not present

## 2016-02-18 MED ORDER — CYANOCOBALAMIN 1000 MCG/ML IJ SOLN
1000.0000 ug | Freq: Once | INTRAMUSCULAR | Status: AC
  Administered 2016-02-18: 1000 ug via INTRAMUSCULAR
  Filled 2016-02-18: qty 1

## 2016-02-28 ENCOUNTER — Ambulatory Visit
Admission: RE | Admit: 2016-02-28 | Discharge: 2016-02-28 | Disposition: A | Discharge status: Home / Self Care | Payer: Medicare HMO | Source: Ambulatory Visit | Attending: Nurse Practitioner | Admitting: Nurse Practitioner

## 2016-02-28 DIAGNOSIS — I712 Thoracic aortic aneurysm, without rupture: Secondary | ICD-10-CM | POA: Diagnosis not present

## 2016-02-28 DIAGNOSIS — I6523 Occlusion and stenosis of bilateral carotid arteries: Secondary | ICD-10-CM | POA: Diagnosis not present

## 2016-02-28 DIAGNOSIS — I6522 Occlusion and stenosis of left carotid artery: Secondary | ICD-10-CM | POA: Diagnosis not present

## 2016-02-28 HISTORY — DX: Systemic involvement of connective tissue, unspecified: M35.9

## 2016-02-28 MED ORDER — IOPAMIDOL (ISOVUE-370) INJECTION 76%
75.0000 mL | Freq: Once | INTRAVENOUS | Status: AC | PRN
  Administered 2016-02-28: 75 mL via INTRAVENOUS

## 2016-02-29 DIAGNOSIS — Z79899 Other long term (current) drug therapy: Secondary | ICD-10-CM | POA: Diagnosis not present

## 2016-02-29 DIAGNOSIS — H524 Presbyopia: Secondary | ICD-10-CM | POA: Diagnosis not present

## 2016-03-10 DIAGNOSIS — M659 Synovitis and tenosynovitis, unspecified: Secondary | ICD-10-CM | POA: Diagnosis not present

## 2016-03-16 ENCOUNTER — Other Ambulatory Visit: Payer: Self-pay | Admitting: Hematology and Oncology

## 2016-03-17 ENCOUNTER — Inpatient Hospital Stay: Payer: Medicare HMO | Attending: Hematology and Oncology

## 2016-03-17 DIAGNOSIS — Z79899 Other long term (current) drug therapy: Secondary | ICD-10-CM | POA: Diagnosis not present

## 2016-03-17 DIAGNOSIS — E538 Deficiency of other specified B group vitamins: Secondary | ICD-10-CM

## 2016-03-17 DIAGNOSIS — D509 Iron deficiency anemia, unspecified: Secondary | ICD-10-CM | POA: Diagnosis present

## 2016-03-17 MED ORDER — CYANOCOBALAMIN 1000 MCG/ML IJ SOLN
1000.0000 ug | Freq: Once | INTRAMUSCULAR | Status: AC
  Administered 2016-03-17: 1000 ug via INTRAMUSCULAR
  Filled 2016-03-17: qty 1

## 2016-03-20 ENCOUNTER — Ambulatory Visit: Payer: Self-pay | Admitting: Psychiatry

## 2016-03-20 DIAGNOSIS — Z79899 Other long term (current) drug therapy: Secondary | ICD-10-CM | POA: Diagnosis not present

## 2016-03-20 DIAGNOSIS — H524 Presbyopia: Secondary | ICD-10-CM | POA: Diagnosis not present

## 2016-03-21 ENCOUNTER — Ambulatory Visit (INDEPENDENT_AMBULATORY_CARE_PROVIDER_SITE_OTHER): Payer: Medicare HMO | Admitting: Psychiatry

## 2016-03-21 ENCOUNTER — Encounter: Payer: Self-pay | Admitting: Psychiatry

## 2016-03-21 VITALS — BP 127/88 | HR 88 | Temp 97.8°F | Ht 66.0 in | Wt 275.0 lb

## 2016-03-21 DIAGNOSIS — F411 Generalized anxiety disorder: Secondary | ICD-10-CM

## 2016-03-21 DIAGNOSIS — F332 Major depressive disorder, recurrent severe without psychotic features: Secondary | ICD-10-CM

## 2016-03-21 DIAGNOSIS — F3170 Bipolar disorder, currently in remission, most recent episode unspecified: Secondary | ICD-10-CM

## 2016-03-21 MED ORDER — BUSPIRONE HCL 10 MG PO TABS: 10.0000 mg | ORAL_TABLET | Freq: Two times a day (BID) | ORAL | 2 refills | Status: DC

## 2016-03-21 MED ORDER — LAMOTRIGINE 150 MG PO TABS: 150.0000 mg | ORAL_TABLET | Freq: Two times a day (BID) | ORAL | 2 refills | Status: DC

## 2016-03-21 MED ORDER — VENLAFAXINE HCL 75 MG PO TABS: 225.0000 mg | ORAL_TABLET | Freq: Every morning | ORAL | 2 refills | Status: DC

## 2016-03-21 MED ORDER — TRAZODONE HCL 100 MG PO TABS: ORAL_TABLET | ORAL | 1 refills | Status: DC

## 2016-03-21 NOTE — Progress Notes (Signed)
Patient ID: Jamie Bennett, female   DOB: 07-02-1961, 55 y.o.   MRN: [id number] [facility] MD/PA/NP OP Progress Note  03/21/2016 1:31 PM Jamie Bennett  MRN:  [id number]  Subjective:  Patient returns for follow-up of her generalized anxiety disorder and  bipolar disorder. With her husband. She reports that she has been thinking a lot about her diagnosis and all the medication she has taken. States that she is not sure if she really has bipolar disorder. Today patient reports that she had abused Provigil quite a bit in the past. She was taking 16-17 pills some days. Husband is endorsing this. Currently patient reports that she is quite depressed and does not have motivation to do anything. States that she would like to visit her elderly parents but since she's gained a lot of weight that restricting her from visiting family. States she is sleeping okay but is needing 200 mg of trazodone to get to sleep. Per husband patient has been taking Xanax everyday for the past week and has been feeling very anxious. It was again discussed with patient and her husband that this physician does not prescribe Xanax for anxiety and that they will have to find someone else if that's the only medication that interested in. Patient however states that she is interested in getting better and wanting to taper off her medications. She denies any suicidal thoughts.   Chief Complaint: Depressed mood  Visit Diagnosis:   No diagnosis found.  Past Medical History:  Past Medical History:  Diagnosis Date  . Anemia   . Anxiety   . Bipolar 1 disorder ([place])   . Collagen vascular disease ([place])    rhematoid arthritis  . Constipation   . Fibromyalgia   . Hypotension   . Osteoarthritis   . Osteoporosis   . Rheumatoid arthritis ([place])   . Thyroid disease     Past Surgical History:  Procedure Laterality Date  . CHOLECYSTECTOMY    . GASTRIC BYPASS     Family History:  Family History  Problem Relation Age of Onset  .  Depression Mother   . Dementia Mother   . Heart disease Mother   . Parkinson's disease Father    Social History:  Social History   Social History  . Marital status: Married    Spouse name: N/A  . Number of children: N/A  . Years of education: N/A   Social History Main Topics  . Smoking status: Never Smoker  . Smokeless tobacco: Never Used  . Alcohol use No  . Drug use: No  . Sexual activity: Yes   Other Topics Concern  . Not on file   Social History Narrative  . No narrative on file   Additional History:   Assessment:   Musculoskeletal: Strength & Muscle Tone: within normal limits Gait & Station: normal Patient leans: N/A  Psychiatric Specialty Exam: HPI  Review of Systems  Psychiatric/Behavioral: Negative for depression, hallucinations, memory loss, substance abuse and suicidal ideas. The patient is nervous/anxious. The patient does not have insomnia.   All other systems reviewed and are negative.   There were no vitals taken for this visit.There is no height or weight on file to calculate BMI.  General Appearance: Fairly Groomed  Eye Contact:  Good  Speech:  Normal Rate  Volume:  Normal  Mood:  Depressed   Affect:  normal  Thought Process:  Circumstantial  Orientation:  Full (Time, Place, and Person)  Thought Content:  Negative  Suicidal Thoughts:  No  Homicidal Thoughts:  No  Memory:  Immediate;   Good Recent;   Good Remote;   Good  Judgement:  Good  Insight:  Fair  Psychomotor Activity:  Negative  Concentration:  Good  Recall:  Good  Fund of Knowledge: Good  Language: Good  Akathisia:  Negative  Handed:   AIMS (if indicated):    Assets:  Desire for Improvement Social Support  ADL's:  Intact  Cognition: WNL  Sleep:  good   Is the patient at risk to self?  No. Has the patient been a risk to self in the past 6 months?  No. Has the patient been a risk to self within the distant past?  Yes.   Is the patient a risk to others?  No. Has the  patient been a risk to others in the past 6 months?  No. Has the patient been a risk to others within the distant past?  No.  Current Medications: Current Outpatient Prescriptions  Medication Sig Dispense Refill  . Biotin 5000 MCG TABS Take 5,000 mcg by mouth daily.     . busPIRone (BUSPAR) 10 MG tablet Take 2 tablets (20 mg total) by mouth 2 (two) times daily. 60 tablet 2  . Cholecalciferol (HM VITAMIN D3) 4000 units CAPS Take 4,000 capsules by mouth daily.    . cyanocobalamin (,VITAMIN B-12,) 1000 MCG/ML injection Inject 1,000 mcg into the muscle every 30 (thirty) days.    Marland Kitchen esomeprazole (NEXIUM) 40 MG capsule Take 1 capsule (40 mg total) by mouth daily at 12 noon.    . Fiber, Guar Gum, CHEW Chew 10 mg by mouth daily.    . hydroxychloroquine (PLAQUENIL) 200 MG tablet Take 200 mg by mouth See admin instructions. 200 mg twice a day Mon-Friday only    . lamoTRIgine (LAMICTAL) 150 MG tablet Take 1 tablet (150 mg total) by mouth 2 (two) times daily. 60 tablet 2  . levothyroxine (SYNTHROID, LEVOTHROID) 112 MCG tablet Take 112 mcg by mouth daily.    . Magnesium 400 MG CAPS Take 400 mg by mouth.    . Multiple Vitamin (MULTIVITAMIN WITH MINERALS) TABS tablet Take 1 tablet by mouth daily.    Marland Kitchen oxyCODONE-acetaminophen (PERCOCET/ROXICET) 5-325 MG tablet Take 1 tablet by mouth every 4 (four) hours as needed for severe pain. 12 tablet 0  . tiZANidine (ZANAFLEX) 4 MG capsule Take 4 mg by mouth 2 (two) times daily as needed for muscle spasms.     . traZODone (DESYREL) 100 MG tablet Take 1 tablet at night as needed for insomnia 30 tablet 2  . venlafaxine (EFFEXOR) 75 MG tablet Take 3 tablets (225 mg total) by mouth every morning. 90 tablet 2  . zoledronic acid (RECLAST) 5 MG/100ML SOLN injection Inject 5 mg into the vein See admin instructions. Patient takes yearly     No current facility-administered medications for this visit.     Medical Decision Making:  Established Problem, Stable/Improving (1),  Review of Medication Regimen & Side Effects (2) and Review of New Medication or Change in Dosage (2)  Treatment Plan Summary:Medication management and Plan Plan   Generalized anxiety disorder- Continue Effexor at 225mg  po qd, patient aware that is the highest approved dose. Decrease buspar to 20mg  po qd. Patient recommended to stop Xanax completely After a taper this week. Patient reports that her husband has The Xanax with him. Patient's husband reports that patient took a tablet almost every day this past week. Discussed with them that she cannot take  have the dose of Xanax daily for the next 5 days and stop. Bipolar disorder Continue Lamictal at 150mg  po bid.  Insomnia Increase Trazodone to 200mg  po qhs prn.  Strongly recommend that patient start seeing a therapist to address her various anxieties and issues with body image.  Return to clinic in  time in 1 months time or call before if necessary      .Hadleigh Felber 03/21/2016, 1:31 PM

## 2016-04-07 DIAGNOSIS — J069 Acute upper respiratory infection, unspecified: Secondary | ICD-10-CM | POA: Diagnosis not present

## 2016-04-10 DIAGNOSIS — R062 Wheezing: Secondary | ICD-10-CM | POA: Diagnosis not present

## 2016-04-10 DIAGNOSIS — R05 Cough: Secondary | ICD-10-CM | POA: Diagnosis not present

## 2016-04-11 DIAGNOSIS — R05 Cough: Secondary | ICD-10-CM | POA: Diagnosis not present

## 2016-04-11 DIAGNOSIS — J069 Acute upper respiratory infection, unspecified: Secondary | ICD-10-CM | POA: Diagnosis not present

## 2016-04-11 DIAGNOSIS — I719 Aortic aneurysm of unspecified site, without rupture: Secondary | ICD-10-CM | POA: Diagnosis not present

## 2016-04-11 DIAGNOSIS — R69 Illness, unspecified: Secondary | ICD-10-CM | POA: Diagnosis not present

## 2016-04-11 DIAGNOSIS — I6529 Occlusion and stenosis of unspecified carotid artery: Secondary | ICD-10-CM | POA: Diagnosis not present

## 2016-04-11 DIAGNOSIS — M545 Low back pain: Secondary | ICD-10-CM | POA: Diagnosis not present

## 2016-04-19 ENCOUNTER — Other Ambulatory Visit: Payer: Self-pay | Admitting: *Deleted

## 2016-04-19 DIAGNOSIS — E538 Deficiency of other specified B group vitamins: Secondary | ICD-10-CM

## 2016-04-20 ENCOUNTER — Ambulatory Visit: Payer: Medicare HMO | Admitting: Psychiatry

## 2016-04-21 IMAGING — CT CT ABDOMEN AND PELVIS WITHOUT AND WITH CONTRAST
2 of 7 series · 12 of 46 positions shown, 18 images · IV contrast (isovue)
Comparison: Multiple exams, including 07/22/2010

CLINICAL DATA: Microscopic hematuria. Lower abdominal pain. Right
flank and back pain. Prior gastric bypass.

EXAM:
CT ABDOMEN AND PELVIS WITHOUT AND WITH CONTRAST
TECHNIQUE: Multidetector CT imaging of the abdomen and pelvis was performed
following the standard protocol before and following the bolus
administration of intravenous contrast.
CONTRAST:  125 cc Isovue 370

[Series 2: routine abd pel without · axial · non-contrast · 0.81mm/px · z∈[-488,-113]mm · 9 of 95 slices shown, 15 images]
[im 10/95  soft-tissue]
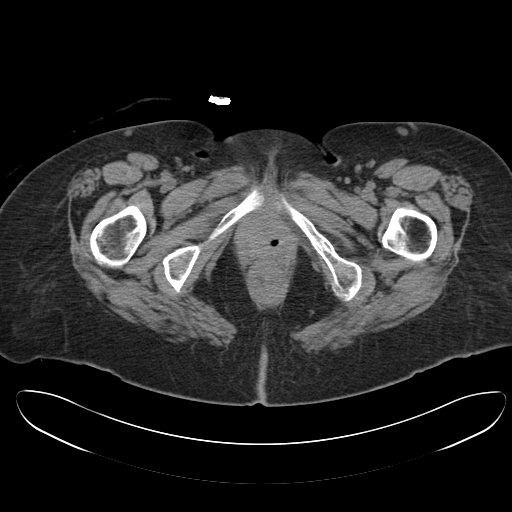
[im 10/95  bone]
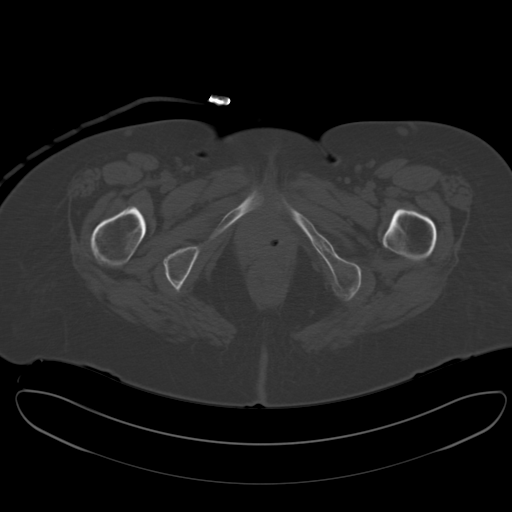
[im 19/95  soft-tissue]
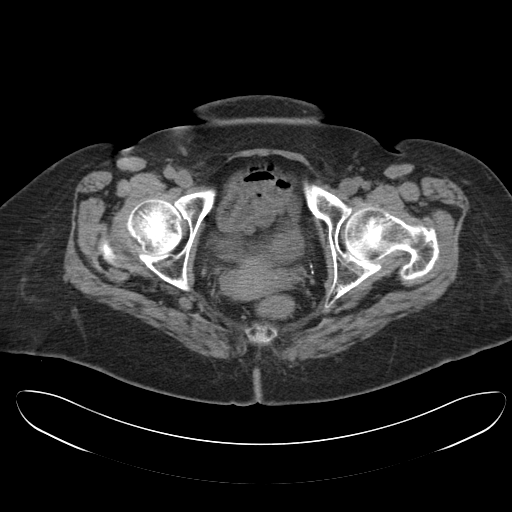
[im 29/95  soft-tissue]
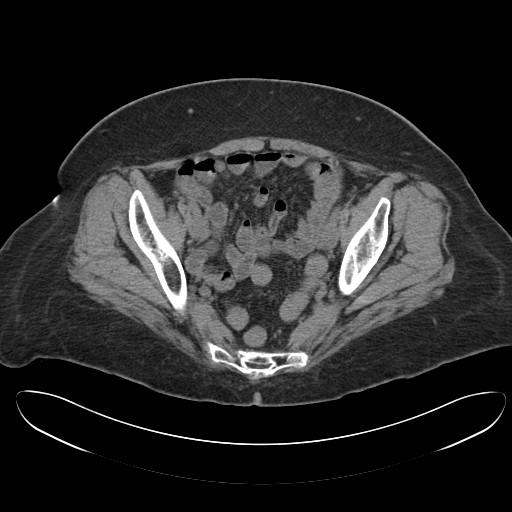
[im 38/95  soft-tissue]
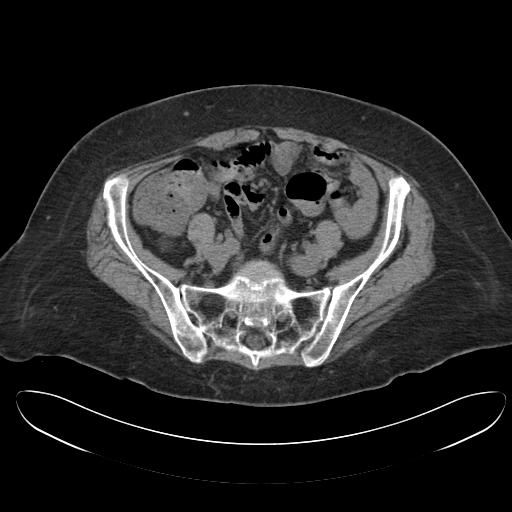
[im 48/95  soft-tissue]
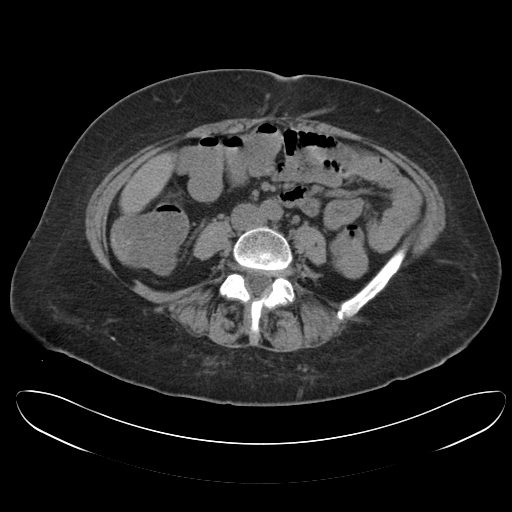
[im 57/95  soft-tissue]
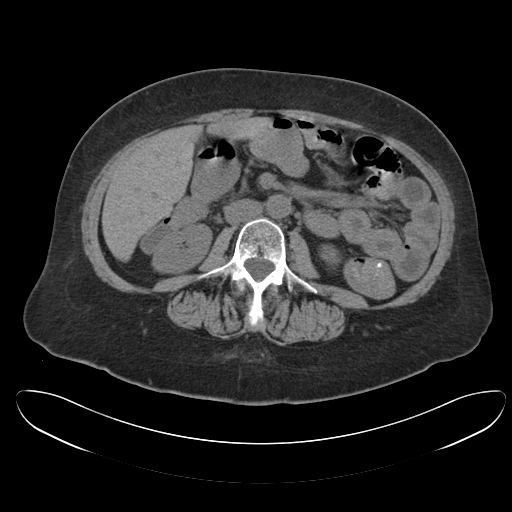
[im 57/95  lung]
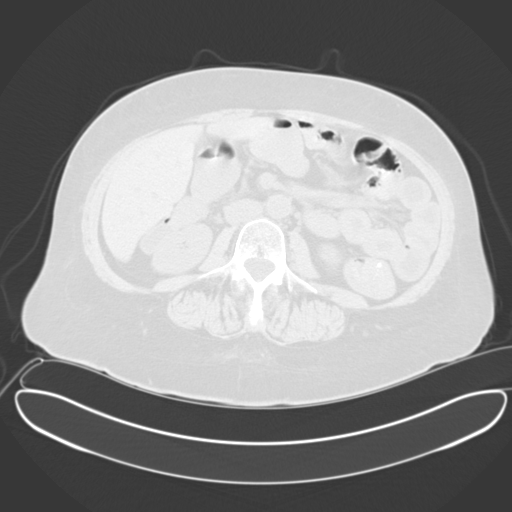
[im 66/95  soft-tissue]
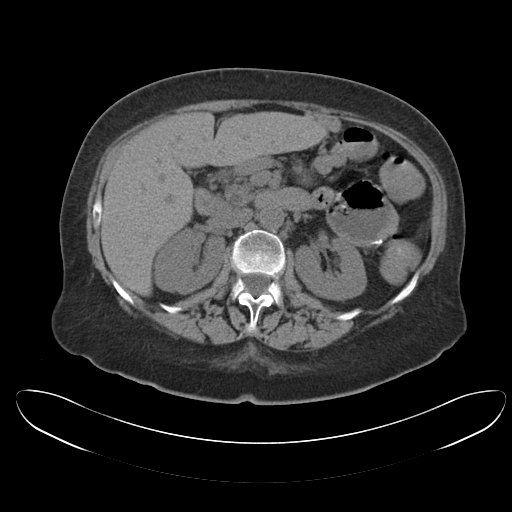
[im 66/95  lung]
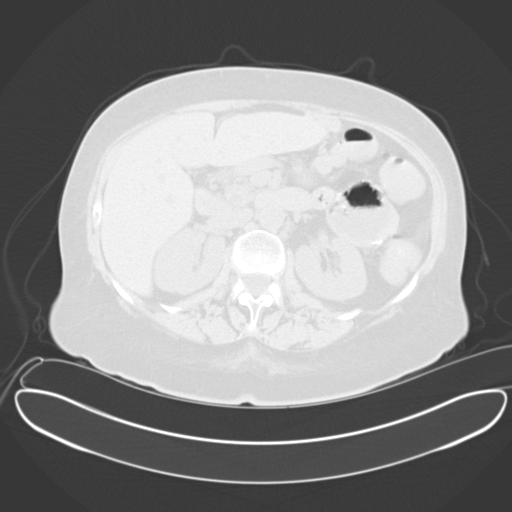
[im 76/95  soft-tissue]
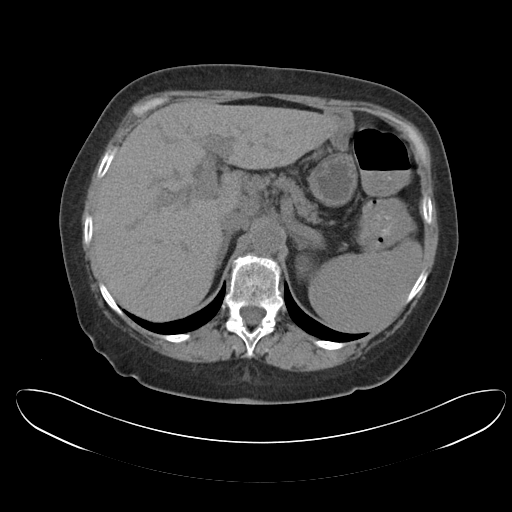
[im 76/95  lung]
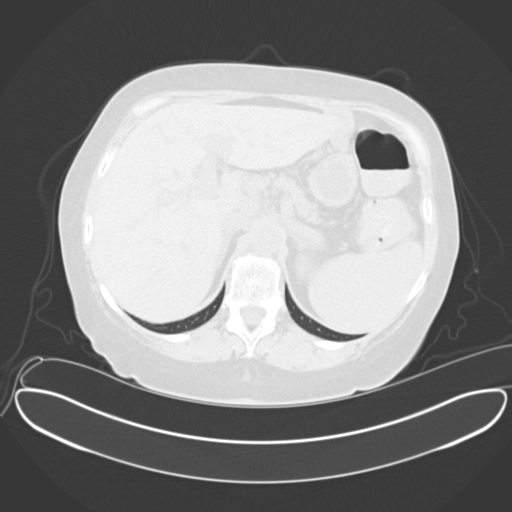
[im 85/95  soft-tissue]
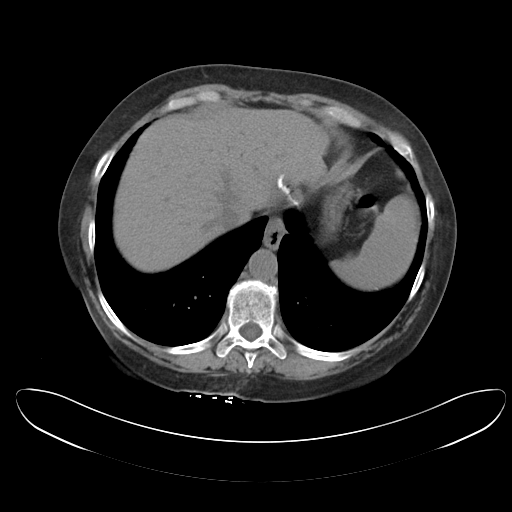
[im 85/95  lung]
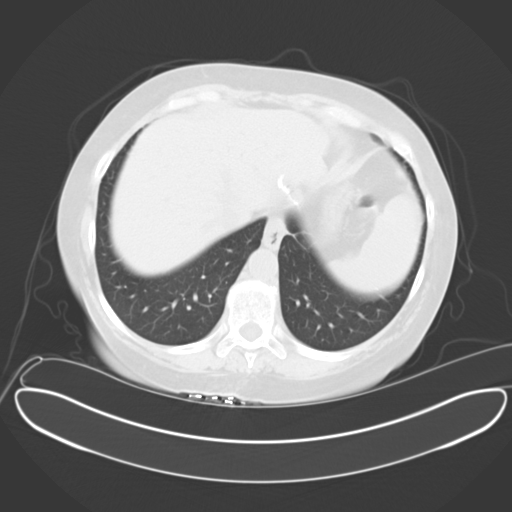
[im 85/95  bone]
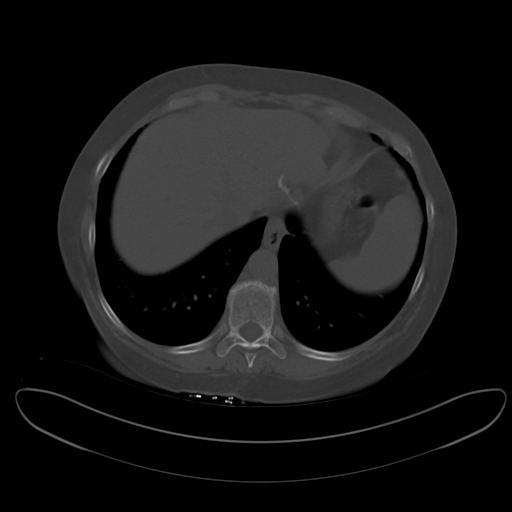

[Series 6: cor hematuria > 45 post · coronal · 0.84mm/px · 3 of 136 slices shown]
[im 34/136  soft-tissue]
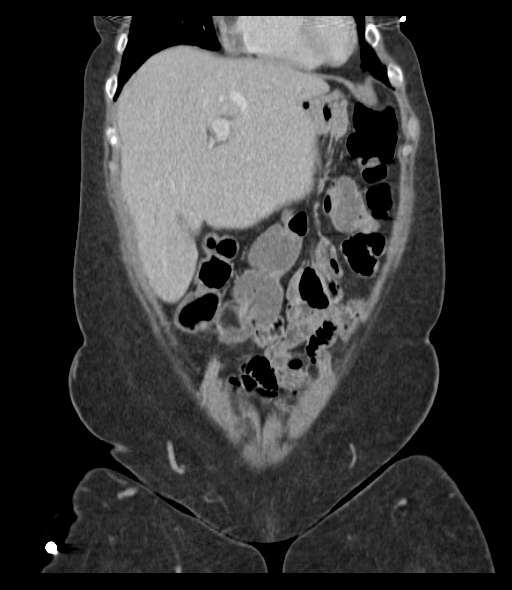
[im 68/136  soft-tissue]
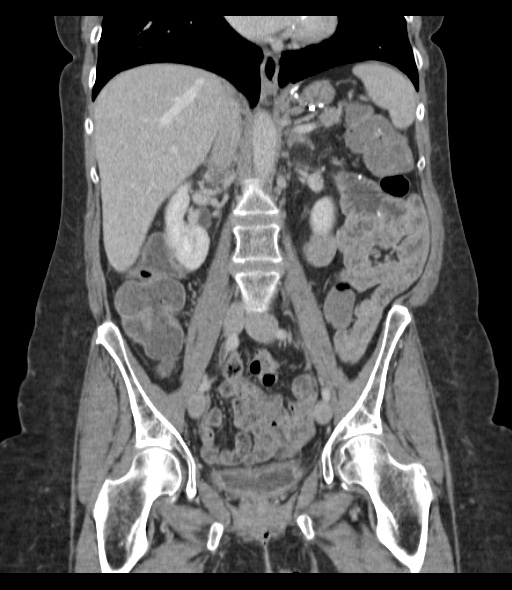
[im 102/136  soft-tissue]
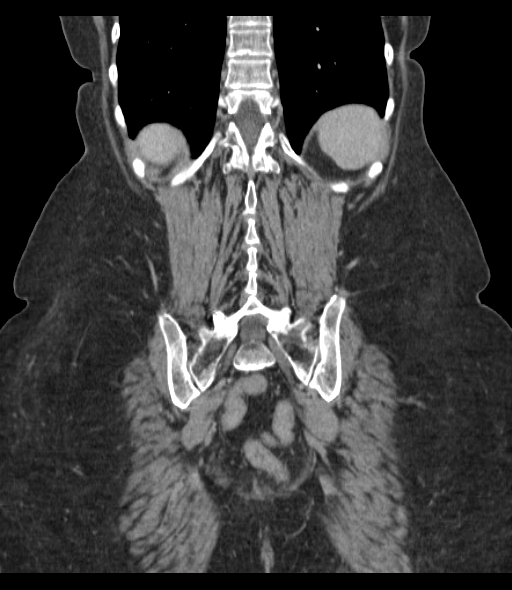

[12 of 46 positions shown; findings below may reference images not displayed]

FINDINGS: Lower chest:  Mild calcification along the mitral valve.

Hepatobiliary: Gallbladder absent. Common bile duct at up to 8 mm,
potentially a physiologic response to cholecystectomy. No
significant abnormal hepatic enhancement.

Spleen: Intact

Pancreas: Intact

Stomach/Bowel: Patent gastrojejunostomy. Air-fluid levels in the
descending and sigmoid colon as can be encountered in diarrheal
process. Visualized appendiceal segment normal.

Adrenals/urinary tract: Renal parenchymal enhancement normal. No
abnormal urothelial enhancement on portal venous phase images. No
discrete filling defect along the urothelium, although please note
that the right ureter distal to the iliac vessel cross over does not
fill. The urinary bladder is relatively nondistended which may
account for the slight thickening of the urinary bladder wall.

Vascular/Lymphatic: Borderline enlarged porta hepatis node at 1 cm
in short axis, image 22 series 4, formerly 0.8 cm. 1.2 cm upper
peripancreatic lymph node, image 21 series 4, previously 1.1 cm by
my measurements. No significant aortoiliac atherosclerosis.

Reproductive: Unremarkable

Musculoskeletal: Old left pubic ramus fractures, healed. Right facet
arthropathy at L5-S1.

Other: None
IMPRESSION: 1. No specific cause for hematuria is identified. The slight wall
thickening of the urinary bladder is likely attributable to under
distension rather than cystitis.
2. Mildly enlarged peripancreatic lymph node, although fairly
similar to the 9400 exam. Borderline enlarged porta hepatis lymph
node, likewise somewhat similar to the prior exam. Likelihood that
these reflect malignancy is very low given the stability.
3. Air-fluid levels in the distal colon are present, and often
indicate a diarrheal process.

## 2016-04-23 ENCOUNTER — Other Ambulatory Visit: Payer: Self-pay | Admitting: Hematology and Oncology

## 2016-04-23 NOTE — Progress Notes (Signed)
[facility] day:  04/24/16  Chief Complaint: Jamie Bennett is a 55 y.o. female status post gastric bypass surgery with iron deficiency anemia, B12 deficiency, and leukopenia who is seen for 4 month assessment.  HPI: The patient was last seen in the medical oncology clinic on 12/21/2015.  At that time, she was fatigued, but improving.  Exam was stable.  Counts had recovered.  CBC was normal.  She received B12.  She has continued to receive B12 monthly (01/21/2016, 02/18/2016, and 03/17/2016).  She states that she has put on "quite a bit of weight".  She notes that her bone and muscles hurt all over.  She has exertional shortness of breath.  She tries to walk in the evening.  She has had some sweats caused by tapering her Effexor.  She notes spots on her arms and ringing in her ears.   Past Medical History:  Diagnosis Date  . Anemia   . Anxiety   . Bipolar 1 disorder ([place])   . Collagen vascular disease ([place])    rhematoid arthritis  . Constipation   . Fibromyalgia   . Hypotension   . Osteoarthritis   . Osteoporosis   . Rheumatoid arthritis ([place])   . Thyroid disease     Past Surgical History:  Procedure Laterality Date  . CHOLECYSTECTOMY    . GASTRIC BYPASS      Family History  Problem Relation Age of Onset  . Depression Mother   . Dementia Mother   . Heart disease Mother   . Parkinson's disease Father     Social History:  reports that she has never smoked. She has never used smokeless tobacco. She reports that she does not drink alcohol or use drugs.  The patient is alone today.  Allergies: No Known Allergies  Current Medications: Current Outpatient Prescriptions  Medication Sig Dispense Refill  . ALPRAZolam (XANAX) 1 MG tablet Take 1 mg by mouth daily as needed for anxiety.    . busPIRone (BUSPAR) 10 MG tablet Take 1 tablet (10 mg total) by mouth 2 (two) times daily. 60 tablet 2  . Cholecalciferol (HM VITAMIN D3)  4000 units CAPS Take 4,000 capsules by mouth daily.    . cyanocobalamin (,VITAMIN B-12,) 1000 MCG/ML injection Inject 1,000 mcg into the muscle every 30 (thirty) days.    Marland Kitchen esomeprazole (NEXIUM) 40 MG capsule Take 1 capsule (40 mg total) by mouth daily at 12 noon.    . Fiber, Guar Gum, CHEW Chew 10 mg by mouth daily.    . fluticasone (FLONASE) 50 MCG/ACT nasal spray Place 1 spray into both nostrils daily.     . hydroxychloroquine (PLAQUENIL) 200 MG tablet Take 200 mg by mouth See admin instructions. 200 mg twice a day Mon-Friday only    . lamoTRIgine (LAMICTAL) 150 MG tablet Take 1 tablet (150 mg total) by mouth 2 (two) times daily. 60 tablet 2  . levothyroxine (SYNTHROID, LEVOTHROID) 112 MCG tablet Take 112 mcg by mouth daily.    . Magnesium 400 MG CAPS Take 400 mg by mouth 2 (two) times daily.     . Multiple Vitamin (MULTIVITAMIN WITH MINERALS) TABS tablet Take 1 tablet by mouth daily.    Marland Kitchen tiZANidine (ZANAFLEX) 4 MG capsule Take 4 mg by mouth 2 (two) times daily as needed for muscle spasms.     . traZODone (DESYREL) 100 MG tablet Take 2 tablet at night as needed for insomnia 60 tablet 1  .  venlafaxine (EFFEXOR) 75 MG tablet Take 3 tablets (225 mg total) by mouth every morning. 90 tablet 2  . zoledronic acid (RECLAST) 5 MG/100ML SOLN injection Inject 5 mg into the vein See admin instructions. Patient takes yearly     Current Facility-Administered Medications  Medication Dose Route Frequency Provider Last Rate Last Dose  . Influenza vac split quadrivalent PF (FLUARIX) injection 0.5 mL  0.5 mL Intramuscular Once Lequita Asal, MD        Review of Systems:  GENERAL:  Fatigue.  No fevers or sweats.  Weight up 33 pounds since 12/21/2015. PERFORMANCE STATUS (ECOG):  1 HEENT:  Tinnitus.  No visual changes, runny nose, sore throat, mouth sores or tenderness. Lungs: No shortness of breath or cough.  No hemoptysis. Cardiac:  No chest pain, palpitations, orthopnea, or PND. GI:  No nausea,  vomiting, diarrhea, constipation, melena or hematochezia. GU:  No urgency, frequency, dysuria, or hematuria. Musculoskeletal:  Bones and joints hurt all over.  Extremities:  No pain or swelling. Skin:  Bruising on arms.  No rashes or skin changes. Neuro:  Balance issues.  No headache, numbness or weakness, or coordination issues. Endocrine:  No diabetes.  Thyroid issues on Synthroid.  No hot flashes.  Sweats associated with tapering Effexor. Psych:  Emotional.  Anxiety. Pain:  Joint pain. Review of systems:  All other systems reviewed and found to be negative.  Physical Exam: Blood pressure 97/64, pulse 91, temperature (!) 96.9 F (36.1 C), temperature source Tympanic, resp. rate 18, weight 281 lb 13.7 oz (127.9 kg). GENERAL:  Well developed, well nourished, heavyset woman sitting comfortably in the exam room in no acute distress.  She has a cane at her side. MENTAL STATUS:  Alert and oriented to person, place and time. HEAD:  Shoulder length brown hair.  Normocephalic, atraumatic, face symmetric, no Cushingoid features. EYES:  Gold rimmed glasses.  Brown eyes.  Pupils equal round and reactive to light and accomodation.  No conjunctivitis or scleral icterus. ENT:  Oropharynx clear without lesion.  Tongue normal. Mucous membranes moist.  RESPIRATORY:  Clear to auscultation without rales, wheezes or rhonchi. CARDIOVASCULAR:  Regular rate and rhythm without murmur, rub or gallop. ABDOMEN:  Fully round.  Soft, non-tender, with active bowel sounds, and no hepatosplenomegaly.  No masses. SKIN:  Slight bruising upper extremities.  No rashes, ulcers or lesions. EXTREMITIES: No edema, no skin discoloration or tenderness.  No palpable cords. LYMPH NODES: No palpable cervical, supraclavicular, axillary or inguinal adenopathy  NEUROLOGICAL: Limited ROM left arm. PSYCH:  Appropriate.  Appointment on 04/24/2016  Component Date Value Ref Range Status  . WBC 04/24/2016 6.2  3.6 - 11.0 K/uL Final  .  RBC 04/24/2016 4.07  3.80 - 5.20 MIL/uL Final  . Hemoglobin 04/24/2016 13.2  12.0 - 16.0 g/dL Final  . HCT 04/24/2016 38.2  35.0 - 47.0 % Final  . MCV 04/24/2016 93.8  80.0 - 100.0 fL Final  . MCH 04/24/2016 32.3  26.0 - 34.0 pg Final  . MCHC 04/24/2016 34.4  32.0 - 36.0 g/dL Final  . RDW 04/24/2016 13.0  11.5 - 14.5 % Final  . Platelets 04/24/2016 219  150 - 440 K/uL Final  . Neutrophils Relative % 04/24/2016 60  % Final  . Neutro Abs 04/24/2016 3.7  1.4 - 6.5 K/uL Final  . Lymphocytes Relative 04/24/2016 29  % Final  . Lymphs Abs 04/24/2016 1.8  1.0 - 3.6 K/uL Final  . Monocytes Relative 04/24/2016 8  % Final  .  Monocytes Absolute 04/24/2016 0.5  0.2 - 0.9 K/uL Final  . Eosinophils Relative 04/24/2016 3  % Final  . Eosinophils Absolute 04/24/2016 0.2  0 - 0.7 K/uL Final  . Basophils Relative 04/24/2016 0  % Final  . Basophils Absolute 04/24/2016 0.0  0 - 0.1 K/uL Final    Assessment:  GHALIA BIBBS is a 55 y.o. female female with a history of gastric bypass surgery in 2003 and subsequent B12 deficiency and iron deficiency anemia. Last colonoscopy was 7 years ago. Diet is good. She eats rich foods. She Is unable to absorb oral iron.   She received IV iron in 02/2014. She received Feraheme 510 mg on 09/15/2015 and 09/22/2015.  Ferritin was 38 and B12 619 on 02/05/2015. Iron saturation was 19%.  She receives B12 monthly (last 03/17/2016).  She also has a has a history of rheumatoid arthritis for which she received Humira in the past as well as methotrexate. Humira caused leukopenia. Methotrexate caused liver toxicity. She is currently on Plaquenil.   She was admitted from 11/08/2015 - 11/19/2015 with altered mental status secondary to an overdose of  Provigil.  Course was complicated by metabolic encephalopathy, intubation, acute renal failure requiring hemodialysis, MSSA pneumonia, Klebsiella UTI, and acute hepatic failure.  CBC at discharge included a hematocrit of 27.1,  hemoglobin 9.2, MCV 95.1, platelets 103,000, and WBC 8900.    Symptomatically, she has gained weight.  She is short of breath on exertion.  Exam is stable.  Counts are normal.  Plan: 1.  Labs today:  CBC with diff, ferritin, iron studies, folate. 2.  B12 today. 3.  Influenza vaccine per patient request. 4.  RTC monthly for B12. 5.  RTC in 3 months for labs (CBC with diff, ferritin) + B12. 6.  RTC in 6 months for MD assessment, labs (CBC with diff, ferritin, iron studies, folate), and B12.   Lequita Asal, MD  04/24/2016, 11:08 AM

## 2016-04-24 ENCOUNTER — Other Ambulatory Visit: Payer: Self-pay | Admitting: *Deleted

## 2016-04-24 ENCOUNTER — Inpatient Hospital Stay: Payer: Medicare HMO

## 2016-04-24 ENCOUNTER — Inpatient Hospital Stay: Payer: Medicare HMO | Attending: Hematology and Oncology | Admitting: Hematology and Oncology

## 2016-04-24 ENCOUNTER — Encounter: Payer: Self-pay | Admitting: Hematology and Oncology

## 2016-04-24 VITALS — BP 97/64 | HR 91 | Temp 96.9°F | Resp 18 | Wt 281.9 lb

## 2016-04-24 DIAGNOSIS — I998 Other disorder of circulatory system: Secondary | ICD-10-CM | POA: Diagnosis not present

## 2016-04-24 DIAGNOSIS — D72819 Decreased white blood cell count, unspecified: Secondary | ICD-10-CM | POA: Diagnosis not present

## 2016-04-24 DIAGNOSIS — Z79899 Other long term (current) drug therapy: Secondary | ICD-10-CM

## 2016-04-24 DIAGNOSIS — Z9884 Bariatric surgery status: Secondary | ICD-10-CM | POA: Insufficient documentation

## 2016-04-24 DIAGNOSIS — F419 Anxiety disorder, unspecified: Secondary | ICD-10-CM | POA: Diagnosis not present

## 2016-04-24 DIAGNOSIS — K5900 Constipation, unspecified: Secondary | ICD-10-CM | POA: Diagnosis not present

## 2016-04-24 DIAGNOSIS — Z23 Encounter for immunization: Secondary | ICD-10-CM | POA: Diagnosis not present

## 2016-04-24 DIAGNOSIS — R21 Rash and other nonspecific skin eruption: Secondary | ICD-10-CM | POA: Diagnosis not present

## 2016-04-24 DIAGNOSIS — M1990 Unspecified osteoarthritis, unspecified site: Secondary | ICD-10-CM | POA: Insufficient documentation

## 2016-04-24 DIAGNOSIS — R0602 Shortness of breath: Secondary | ICD-10-CM

## 2016-04-24 DIAGNOSIS — H9319 Tinnitus, unspecified ear: Secondary | ICD-10-CM

## 2016-04-24 DIAGNOSIS — E538 Deficiency of other specified B group vitamins: Secondary | ICD-10-CM

## 2016-04-24 DIAGNOSIS — D509 Iron deficiency anemia, unspecified: Secondary | ICD-10-CM

## 2016-04-24 DIAGNOSIS — F319 Bipolar disorder, unspecified: Secondary | ICD-10-CM | POA: Insufficient documentation

## 2016-04-24 DIAGNOSIS — M797 Fibromyalgia: Secondary | ICD-10-CM | POA: Insufficient documentation

## 2016-04-24 DIAGNOSIS — R69 Illness, unspecified: Secondary | ICD-10-CM | POA: Diagnosis not present

## 2016-04-24 DIAGNOSIS — M069 Rheumatoid arthritis, unspecified: Secondary | ICD-10-CM | POA: Diagnosis not present

## 2016-04-24 DIAGNOSIS — I959 Hypotension, unspecified: Secondary | ICD-10-CM

## 2016-04-24 LAB — CBC WITH DIFFERENTIAL/PLATELET
Basophils Absolute: 0 10*3/uL (ref 0–0.1)
Basophils Relative: 0 %
Eosinophils Absolute: 0.2 10*3/uL (ref 0–0.7)
Eosinophils Relative: 3 %
HCT: 38.2 % (ref 35.0–47.0)
Hemoglobin: 13.2 g/dL (ref 12.0–16.0)
Lymphocytes Relative: 29 %
Lymphs Abs: 1.8 10*3/uL (ref 1.0–3.6)
MCH: 32.3 pg (ref 26.0–34.0)
MCHC: 34.4 g/dL (ref 32.0–36.0)
MCV: 93.8 fL (ref 80.0–100.0)
Monocytes Absolute: 0.5 10*3/uL (ref 0.2–0.9)
Monocytes Relative: 8 %
Neutro Abs: 3.7 10*3/uL (ref 1.4–6.5)
Neutrophils Relative %: 60 %
Platelets: 219 10*3/uL (ref 150–440)
RBC: 4.07 MIL/uL (ref 3.80–5.20)
RDW: 13 % (ref 11.5–14.5)
WBC: 6.2 10*3/uL (ref 3.6–11.0)

## 2016-04-24 LAB — IRON AND TIBC
Iron: 147 ug/dL (ref 28–170)
Saturation Ratios: 39 % — ABNORMAL HIGH (ref 10.4–31.8)
TIBC: 382 ug/dL (ref 250–450)
UIBC: 235 ug/dL

## 2016-04-24 LAB — FERRITIN: Ferritin: 102 ng/mL (ref 11–307)

## 2016-04-24 LAB — FOLATE: Folate: 19.9 ng/mL (ref >5.9)

## 2016-04-24 MED ORDER — CYANOCOBALAMIN 1000 MCG/ML IJ SOLN
1000.0000 ug | Freq: Once | INTRAMUSCULAR | Status: AC
  Administered 2016-04-24: 1000 ug via INTRAMUSCULAR
  Filled 2016-04-24: qty 1

## 2016-04-24 MED ORDER — INFLUENZA VAC SPLIT QUAD 0.5 ML IM SUSY
0.5000 mL | PREFILLED_SYRINGE | Freq: Once | INTRAMUSCULAR | Status: AC
  Administered 2016-04-24: 0.5 mL via INTRAMUSCULAR
  Filled 2016-04-24: qty 0.5

## 2016-04-24 NOTE — Progress Notes (Signed)
Patient is here today for follow up, she would like her flu shot. She also mentions when sitting or standing she will fel hot and start sweating. She also mentions some dizziness.  Standing bp was 105/75 hr 112  Sitting 97/64 hr 91  She also mentions some headaches.

## 2016-04-26 ENCOUNTER — Ambulatory Visit (INDEPENDENT_AMBULATORY_CARE_PROVIDER_SITE_OTHER): Payer: Medicare HMO | Admitting: Psychiatry

## 2016-04-26 ENCOUNTER — Encounter: Payer: Self-pay | Admitting: Psychiatry

## 2016-04-26 VITALS — BP 131/83 | HR 103 | Temp 99.2°F | Wt 280.0 lb

## 2016-04-26 DIAGNOSIS — F332 Major depressive disorder, recurrent severe without psychotic features: Secondary | ICD-10-CM

## 2016-04-26 DIAGNOSIS — F411 Generalized anxiety disorder: Secondary | ICD-10-CM | POA: Diagnosis not present

## 2016-04-26 NOTE — Progress Notes (Signed)
Patient ID: Jamie Bennett, female   DOB: 14-Nov-1960, 55 y.o.   MRN: [id number] [facility] MD/PA/NP OP Progress Note  04/26/2016 1:41 PM Jamie Bennett  MRN:  [id number]  Subjective:  Patient returns for follow-up of her generalized anxiety disorder and  bipolar disorder. Reports doing better, her mood has improved. She had a stressful time during her family reunion, with her father`s comments about her weight previously. States she coped well with the situation. Reports taking Xanax about 1-2 times per week. She is  Sleeping much better. She denies any suicidal thoughts.   Chief Complaint: improved mood Chief Complaint    Follow-up; Medication Refill     Visit Diagnosis:     ICD-9-CM ICD-10-CM   1. GAD (generalized anxiety disorder) 300.02 F41.1   2. Major depressive disorder, recurrent, severe without psychotic features ([place]) 296.33 F33.2     Past Medical History:  Past Medical History:  Diagnosis Date  . Anemia   . Anxiety   . Bipolar 1 disorder ([place])   . Collagen vascular disease ([place])    rhematoid arthritis  . Constipation   . Fibromyalgia   . Hypotension   . Osteoarthritis   . Osteoporosis   . Rheumatoid arthritis ([place])   . Thyroid disease     Past Surgical History:  Procedure Laterality Date  . CHOLECYSTECTOMY    . GASTRIC BYPASS     Family History:  Family History  Problem Relation Age of Onset  . Depression Mother   . Dementia Mother   . Heart disease Mother   . Parkinson's disease Father    Social History:  Social History   Social History  . Marital status: Married    Spouse name: N/A  . Number of children: N/A  . Years of education: N/A   Social History Main Topics  . Smoking status: Never Smoker  . Smokeless tobacco: Never Used  . Alcohol use No  . Drug use: No  . Sexual activity: Yes   Other Topics Concern  . None   Social History Narrative  . None   Additional History:   Assessment:   Musculoskeletal: Strength & Muscle Tone:  within normal limits Gait & Station: normal Patient leans: N/A  Psychiatric Specialty Exam: HPI  Review of Systems  Psychiatric/Behavioral: Negative for depression, hallucinations, memory loss, substance abuse and suicidal ideas. The patient is nervous/anxious. The patient does not have insomnia.   All other systems reviewed and are negative.   Blood pressure 131/83, pulse (!) 103, temperature 99.2 F (37.3 C), temperature source Oral, weight 280 lb (127 kg).Body mass index is 45.19 kg/m.  General Appearance: Fairly Groomed  Eye Contact:  Good  Speech:  Normal Rate  Volume:  Normal  Mood:  Improved   Affect:  normal  Thought Process:  Circumstantial  Orientation:  Full (Time, Place, and Person)  Thought Content:  Negative  Suicidal Thoughts:  No  Homicidal Thoughts:  No  Memory:  Immediate;   Good Recent;   Good Remote;   Good  Judgement:  Good  Insight:  Fair  Psychomotor Activity:  Negative  Concentration:  Good  Recall:  Good  Fund of Knowledge: Good  Language: Good  Akathisia:  Negative  Handed:   AIMS (if indicated):    Assets:  Desire for Improvement Social Support  ADL's:  Intact  Cognition: WNL  Sleep:  good   Is the patient at risk to self?  No. Has the patient been a risk to self  in the past 6 months?  No. Has the patient been a risk to self within the distant past?  Yes.   Is the patient a risk to others?  No. Has the patient been a risk to others in the past 6 months?  No. Has the patient been a risk to others within the distant past?  No.  Current Medications: Current Outpatient Prescriptions  Medication Sig Dispense Refill  . ALPRAZolam (XANAX) 1 MG tablet Take 1 mg by mouth daily as needed for anxiety.    . busPIRone (BUSPAR) 10 MG tablet Take 1 tablet (10 mg total) by mouth 2 (two) times daily. 60 tablet 2  . Cholecalciferol (HM VITAMIN D3) 4000 units CAPS Take 4,000 capsules by mouth daily.    . cyanocobalamin (,VITAMIN B-12,) 1000 MCG/ML  injection Inject 1,000 mcg into the muscle every 30 (thirty) days.    Marland Kitchen esomeprazole (NEXIUM) 40 MG capsule Take 1 capsule (40 mg total) by mouth daily at 12 noon.    . Fiber, Guar Gum, CHEW Chew 10 mg by mouth daily.    . fluticasone (FLONASE) 50 MCG/ACT nasal spray Place 1 spray into both nostrils daily.     . hydroxychloroquine (PLAQUENIL) 200 MG tablet Take 200 mg by mouth See admin instructions. 200 mg twice a day Mon-Friday only    . lamoTRIgine (LAMICTAL) 150 MG tablet Take 1 tablet (150 mg total) by mouth 2 (two) times daily. 60 tablet 2  . levothyroxine (SYNTHROID, LEVOTHROID) 112 MCG tablet Take 112 mcg by mouth daily.    . Magnesium 400 MG CAPS Take 400 mg by mouth 2 (two) times daily.     . Multiple Vitamin (MULTIVITAMIN WITH MINERALS) TABS tablet Take 1 tablet by mouth daily.    Marland Kitchen tiZANidine (ZANAFLEX) 4 MG capsule Take 4 mg by mouth 2 (two) times daily as needed for muscle spasms.     . traZODone (DESYREL) 100 MG tablet Take 2 tablet at night as needed for insomnia 60 tablet 1  . venlafaxine (EFFEXOR) 75 MG tablet Take 3 tablets (225 mg total) by mouth every morning. 90 tablet 2  . zoledronic acid (RECLAST) 5 MG/100ML SOLN injection Inject 5 mg into the vein See admin instructions. Patient takes yearly     No current facility-administered medications for this visit.     Medical Decision Making:  Established Problem, Stable/Improving (1), Review of Medication Regimen & Side Effects (2) and Review of New Medication or Change in Dosage (2)  Treatment Plan Summary:Medication management and Plan Plan   Generalized anxiety disorder- Continue Effexor at 225mg  po qd. Decrease buspar to 10mg  po qd For 1 week and then discontinue Continue Lamictal at 150mg  po bid.  Insomnia Continue Trazodone to 200mg  po qhs prn.  Strongly recommend that patient start seeing a therapist to address her various anxieties and issues with body image.  Return to clinic in  time in 2 months time or call  before if necessary      .Jamie Bennett 04/26/2016, 1:41 PM

## 2016-05-22 ENCOUNTER — Inpatient Hospital Stay: Payer: Medicare HMO | Attending: Hematology and Oncology

## 2016-05-22 DIAGNOSIS — E538 Deficiency of other specified B group vitamins: Secondary | ICD-10-CM

## 2016-05-22 DIAGNOSIS — Z79899 Other long term (current) drug therapy: Secondary | ICD-10-CM | POA: Insufficient documentation

## 2016-05-22 MED ORDER — CYANOCOBALAMIN 1000 MCG/ML IJ SOLN
1000.0000 ug | Freq: Once | INTRAMUSCULAR | Status: AC
  Administered 2016-05-22: 1000 ug via INTRAMUSCULAR
  Filled 2016-05-22: qty 1

## 2016-05-23 ENCOUNTER — Other Ambulatory Visit: Payer: Self-pay | Admitting: Psychiatry

## 2016-05-29 ENCOUNTER — Telehealth: Payer: Self-pay

## 2016-05-29 NOTE — Telephone Encounter (Signed)
left message on dr. Awilda Metro for desyrel 100mg  #60 take 2 tabletsy by mouth at night as needed for insomnia.  no additional refills

## 2016-05-29 NOTE — Telephone Encounter (Signed)
received a fax reqreceived a fax requesting a refill on desyrel 100mg .

## 2016-05-30 DIAGNOSIS — R21 Rash and other nonspecific skin eruption: Secondary | ICD-10-CM | POA: Diagnosis not present

## 2016-05-30 DIAGNOSIS — L209 Atopic dermatitis, unspecified: Secondary | ICD-10-CM | POA: Diagnosis not present

## 2016-06-12 ENCOUNTER — Inpatient Hospital Stay: Payer: Medicare HMO | Attending: Hematology and Oncology

## 2016-06-12 DIAGNOSIS — E538 Deficiency of other specified B group vitamins: Secondary | ICD-10-CM | POA: Insufficient documentation

## 2016-06-12 DIAGNOSIS — Z79899 Other long term (current) drug therapy: Secondary | ICD-10-CM | POA: Diagnosis not present

## 2016-06-12 MED ORDER — CYANOCOBALAMIN 1000 MCG/ML IJ SOLN
1000.0000 ug | Freq: Once | INTRAMUSCULAR | Status: AC
  Administered 2016-06-12: 1000 ug via INTRAMUSCULAR
  Filled 2016-06-12: qty 1

## 2016-06-16 DIAGNOSIS — M810 Age-related osteoporosis without current pathological fracture: Secondary | ICD-10-CM | POA: Diagnosis not present

## 2016-06-19 DIAGNOSIS — J208 Acute bronchitis due to other specified organisms: Secondary | ICD-10-CM | POA: Diagnosis not present

## 2016-06-19 DIAGNOSIS — J019 Acute sinusitis, unspecified: Secondary | ICD-10-CM | POA: Diagnosis not present

## 2016-06-19 DIAGNOSIS — B9689 Other specified bacterial agents as the cause of diseases classified elsewhere: Secondary | ICD-10-CM | POA: Diagnosis not present

## 2016-06-21 ENCOUNTER — Other Ambulatory Visit: Payer: Self-pay | Admitting: Rheumatology

## 2016-06-21 DIAGNOSIS — Z79899 Other long term (current) drug therapy: Secondary | ICD-10-CM

## 2016-06-24 DIAGNOSIS — R05 Cough: Secondary | ICD-10-CM | POA: Diagnosis not present

## 2016-06-26 ENCOUNTER — Telehealth: Payer: Self-pay | Admitting: Radiology

## 2016-06-26 ENCOUNTER — Ambulatory Visit: Payer: Medicare HMO | Admitting: Psychiatry

## 2016-06-26 NOTE — Telephone Encounter (Signed)
02/29/16 normal PLQ eye exam Last visit 01/21/16 Next visit 07/12/16 Labs 12/29/15 /due will call patient

## 2016-06-26 NOTE — Telephone Encounter (Signed)
02/29/16 normal PLQ eye exam Last visit 01/21/16 Next visit 07/12/16 Labs 12/29/15 /due will call patient  Ok to refill per Dr Estanislado Pandy one month supply

## 2016-06-27 ENCOUNTER — Ambulatory Visit (INDEPENDENT_AMBULATORY_CARE_PROVIDER_SITE_OTHER): Payer: Medicare HMO | Admitting: Psychiatry

## 2016-06-27 ENCOUNTER — Encounter: Payer: Self-pay | Admitting: Psychiatry

## 2016-06-27 VITALS — BP 131/88 | HR 90 | Temp 97.3°F | Resp 16 | Wt 290.0 lb

## 2016-06-27 DIAGNOSIS — F411 Generalized anxiety disorder: Secondary | ICD-10-CM

## 2016-06-27 DIAGNOSIS — F3170 Bipolar disorder, currently in remission, most recent episode unspecified: Secondary | ICD-10-CM

## 2016-06-27 DIAGNOSIS — F332 Major depressive disorder, recurrent severe without psychotic features: Secondary | ICD-10-CM

## 2016-06-27 MED ORDER — HYDROXYZINE HCL 10 MG PO TABS: 10.0000 mg | ORAL_TABLET | Freq: Three times a day (TID) | ORAL | 0 refills | Status: DC | PRN

## 2016-06-27 MED ORDER — VENLAFAXINE HCL 75 MG PO TABS: 225.0000 mg | ORAL_TABLET | Freq: Every morning | ORAL | 2 refills | Status: DC

## 2016-06-27 MED ORDER — LAMOTRIGINE 200 MG PO TABS: 200.0000 mg | ORAL_TABLET | Freq: Two times a day (BID) | ORAL | 1 refills | Status: DC

## 2016-06-27 MED ORDER — TRAZODONE HCL 100 MG PO TABS: ORAL_TABLET | ORAL | 1 refills | Status: DC

## 2016-06-27 NOTE — Progress Notes (Signed)
Patient ID: [name], female   DOB: Apr 13, 1961, 55 y.o.   MRN: [id number] [facility] MD/PA/NP OP Progress Note  06/27/2016 10:40 AM Jamie Bennett  MRN:  [id number]  Subjective:  Patient returns for follow-up of her generalized anxiety disorder and  bipolar disorder. Patient reports that she seems to be doing okay except that she has periods when she has down moods. She also reports that there are times when she feels very happy and feels more energetic than usual. Her husband reports that he hasn't seen her as being manic but patient reports that during the times when she gets so she gets really hopeless. Denies any suicidal thoughts. She continues to perseverate about her father's comments to her. Reports she does take the BuSpar 10 mg once in a while. She also feels like she has some panic attacks once in a while and would like something for it.    Chief Complaint: Up and down moods  Visit Diagnosis:     ICD-9-CM ICD-10-CM   1. GAD (generalized anxiety disorder) 300.02 F41.1   2. Major depressive disorder, recurrent, severe without psychotic features ([place]) 296.33 F33.2   3. Bipolar disorder in full remission, most recent episode unspecified type ([place]) 296.80 F31.70     Past Medical History:  Past Medical History:  Diagnosis Date  . Anemia   . Anxiety   . Bipolar 1 disorder ([place])   . Collagen vascular disease ([place])    rhematoid arthritis  . Constipation   . Fibromyalgia   . Hypotension   . Osteoarthritis   . Osteoporosis   . Rheumatoid arthritis ([place])   . Thyroid disease     Past Surgical History:  Procedure Laterality Date  . CHOLECYSTECTOMY    . GASTRIC BYPASS     Family History:  Family History  Problem Relation Age of Onset  . Depression Mother   . Dementia Mother   . Heart disease Mother   . Parkinson's disease Father    Social History:  Social History   Social History  . Marital status: Married    Spouse name: N/A  . Number of children: N/A  .  Years of education: N/A   Social History Main Topics  . Smoking status: Never Smoker  . Smokeless tobacco: Never Used  . Alcohol use No  . Drug use: No  . Sexual activity: Yes   Other Topics Concern  . Not on file   Social History Narrative  . No narrative on file   Additional History:   Assessment:   Musculoskeletal: Strength & Muscle Tone: within normal limits Gait & Station: normal Patient leans: N/A  Psychiatric Specialty Exam: Medication Refill     Review of Systems  Psychiatric/Behavioral: Negative for depression, hallucinations, memory loss, substance abuse and suicidal ideas. The patient is nervous/anxious. The patient does not have insomnia.   All other systems reviewed and are negative.   There were no vitals taken for this visit.There is no height or weight on file to calculate BMI.  General Appearance: Fairly Groomed  Eye Contact:  Good  Speech:  Normal Rate  Volume:  Normal  Mood:  Up and down   Affect:  normal  Thought Process:  Circumstantial  Orientation:  Full (Time, Place, and Person)  Thought Content:  Negative  Suicidal Thoughts:  No  Homicidal Thoughts:  No  Memory:  Immediate;   Good Recent;   Good Remote;   Good  Judgement:  Good  Insight:  Fair  Psychomotor Activity:  Negative  Concentration:  Good  Recall:  Good  Fund of Knowledge: Good  Language: Good  Akathisia:  Negative  Handed:   AIMS (if indicated):    Assets:  Desire for Improvement Social Support  ADL's:  Intact  Cognition: WNL  Sleep:  good   Is the patient at risk to self?  No. Has the patient been a risk to self in the past 6 months?  No. Has the patient been a risk to self within the distant past?  Yes.   Is the patient a risk to others?  No. Has the patient been a risk to others in the past 6 months?  No. Has the patient been a risk to others within the distant past?  No.  Current Medications: Current Outpatient Prescriptions  Medication Sig Dispense Refill   . ALPRAZolam (XANAX) 1 MG tablet Take 1 mg by mouth daily as needed for anxiety.    . busPIRone (BUSPAR) 10 MG tablet Take 1 tablet (10 mg total) by mouth 2 (two) times daily. 60 tablet 2  . Cholecalciferol (HM VITAMIN D3) 4000 units CAPS Take 4,000 capsules by mouth daily.    . cyanocobalamin (,VITAMIN B-12,) 1000 MCG/ML injection Inject 1,000 mcg into the muscle every 30 (thirty) days.    Marland Kitchen esomeprazole (NEXIUM) 40 MG capsule Take 1 capsule (40 mg total) by mouth daily at 12 noon.    . Fiber, Guar Gum, CHEW Chew 10 mg by mouth daily.    . fluticasone (FLONASE) 50 MCG/ACT nasal spray Place 1 spray into both nostrils daily.     . hydroxychloroquine (PLAQUENIL) 200 MG tablet TAKE ONE TABLET BY MOUTH TWICE DAILY MONDAY THROUGH FRIDAY. 40 tablet 0  . lamoTRIgine (LAMICTAL) 150 MG tablet Take 1 tablet (150 mg total) by mouth 2 (two) times daily. 60 tablet 2  . levothyroxine (SYNTHROID, LEVOTHROID) 112 MCG tablet Take 112 mcg by mouth daily.    . Magnesium 400 MG CAPS Take 400 mg by mouth 2 (two) times daily.     . Multiple Vitamin (MULTIVITAMIN WITH MINERALS) TABS tablet Take 1 tablet by mouth daily.    Marland Kitchen tiZANidine (ZANAFLEX) 4 MG capsule Take 4 mg by mouth 2 (two) times daily as needed for muscle spasms.     . traZODone (DESYREL) 100 MG tablet Take 2 tablet at night as needed for insomnia 60 tablet 1  . venlafaxine (EFFEXOR) 75 MG tablet Take 3 tablets (225 mg total) by mouth every morning. 90 tablet 2  . zoledronic acid (RECLAST) 5 MG/100ML SOLN injection Inject 5 mg into the vein See admin instructions. Patient takes yearly     No current facility-administered medications for this visit.     Medical Decision Making:  Established Problem, Stable/Improving (1), Review of Medication Regimen & Side Effects (2) and Review of New Medication or Change in Dosage (2)  Treatment Plan Summary:Medication management and Plan Plan   Generalized anxiety disorder- Continue Effexor at 225mg  po  qd. Increase Lamictal to 200mg  po bid.  Insomnia Continue Trazodone to 200mg  po qhs prn.  Panic symptoms Take Vistaril at 10 mg as needed 1-2 times daily when having panic symptoms  Strongly recommend that patient start seeing a therapist to address her various anxieties and issues with body image.  Return to clinic in  time in 2 months time or call before if necessary      .Jahson Emanuele 06/27/2016, 10:40 AM

## 2016-06-29 ENCOUNTER — Ambulatory Visit: Payer: Self-pay | Admitting: Rheumatology

## 2016-06-29 ENCOUNTER — Other Ambulatory Visit: Payer: Self-pay | Admitting: Radiology

## 2016-06-29 DIAGNOSIS — Z79899 Other long term (current) drug therapy: Secondary | ICD-10-CM | POA: Diagnosis not present

## 2016-06-29 LAB — COMPREHENSIVE METABOLIC PANEL
ALT: 18 U/L (ref 6–29)
AST: 14 U/L (ref 10–35)
Albumin: 3.6 g/dL (ref 3.6–5.1)
Alkaline Phosphatase: 107 U/L (ref 33–130)
BUN: 13 mg/dL (ref 7–25)
CO2: 25 mmol/L (ref 20–31)
Calcium: 9 mg/dL (ref 8.6–10.4)
Chloride: 106 mmol/L (ref 98–110)
Creat: 0.92 mg/dL (ref 0.50–1.05)
Glucose, Bld: 102 mg/dL — ABNORMAL HIGH (ref 65–99)
Potassium: 4.5 mmol/L (ref 3.5–5.3)
Sodium: 139 mmol/L (ref 135–146)
Total Bilirubin: 0.3 mg/dL (ref 0.2–1.2)
Total Protein: 5.9 g/dL — ABNORMAL LOW (ref 6.1–8.1)

## 2016-06-29 LAB — CBC WITH DIFFERENTIAL/PLATELET
Basophils Absolute: 0 cells/uL (ref 0–200)
Basophils Relative: 0 %
Eosinophils Absolute: 275 cells/uL (ref 15–500)
Eosinophils Relative: 5 %
HCT: 36.6 % (ref 35.0–45.0)
Hemoglobin: 12 g/dL (ref 11.7–15.5)
Lymphocytes Relative: 36 %
Lymphs Abs: 1980 cells/uL (ref 850–3900)
MCH: 30.8 pg (ref 27.0–33.0)
MCHC: 32.8 g/dL (ref 32.0–36.0)
MCV: 94.1 fL (ref 80.0–100.0)
MPV: 9.3 fL (ref 7.5–12.5)
Monocytes Absolute: 440 cells/uL (ref 200–950)
Monocytes Relative: 8 %
Neutro Abs: 2805 cells/uL (ref 1500–7800)
Neutrophils Relative %: 51 %
Platelets: 232 10*3/uL (ref 140–400)
RBC: 3.89 MIL/uL (ref 3.80–5.10)
RDW: 12.3 % (ref 11.0–15.0)
WBC: 5.5 10*3/uL (ref 3.8–10.8)

## 2016-06-30 NOTE — Progress Notes (Signed)
Labs normal.

## 2016-07-04 DIAGNOSIS — M816 Localized osteoporosis [Lequesne]: Secondary | ICD-10-CM | POA: Diagnosis not present

## 2016-07-10 ENCOUNTER — Inpatient Hospital Stay: Payer: Medicare HMO

## 2016-07-10 ENCOUNTER — Inpatient Hospital Stay: Payer: Medicare HMO | Attending: Hematology and Oncology

## 2016-07-10 ENCOUNTER — Other Ambulatory Visit: Payer: Self-pay

## 2016-07-10 DIAGNOSIS — D509 Iron deficiency anemia, unspecified: Secondary | ICD-10-CM

## 2016-07-10 DIAGNOSIS — E538 Deficiency of other specified B group vitamins: Secondary | ICD-10-CM | POA: Insufficient documentation

## 2016-07-10 DIAGNOSIS — T1491XA Suicide attempt, initial encounter: Secondary | ICD-10-CM | POA: Insufficient documentation

## 2016-07-10 DIAGNOSIS — Z79899 Other long term (current) drug therapy: Secondary | ICD-10-CM | POA: Diagnosis not present

## 2016-07-10 DIAGNOSIS — M797 Fibromyalgia: Secondary | ICD-10-CM | POA: Insufficient documentation

## 2016-07-10 LAB — CBC WITH DIFFERENTIAL/PLATELET
Basophils Absolute: 0 10*3/uL (ref 0–0.1)
Basophils Relative: 0 %
Eosinophils Absolute: 0.2 10*3/uL (ref 0–0.7)
Eosinophils Relative: 6 %
HCT: 40.2 % (ref 35.0–47.0)
Hemoglobin: 13.5 g/dL (ref 12.0–16.0)
Lymphocytes Relative: 38 %
Lymphs Abs: 1.6 10*3/uL (ref 1.0–3.6)
MCH: 31.3 pg (ref 26.0–34.0)
MCHC: 33.6 g/dL (ref 32.0–36.0)
MCV: 93 fL (ref 80.0–100.0)
Monocytes Absolute: 0.4 10*3/uL (ref 0.2–0.9)
Monocytes Relative: 10 %
Neutro Abs: 1.9 10*3/uL (ref 1.4–6.5)
Neutrophils Relative %: 46 %
Platelets: 246 10*3/uL (ref 150–440)
RBC: 4.32 MIL/uL (ref 3.80–5.20)
RDW: 12.8 % (ref 11.5–14.5)
WBC: 4.2 10*3/uL (ref 3.6–11.0)

## 2016-07-10 LAB — FERRITIN: Ferritin: 73 ng/mL (ref 11–307)

## 2016-07-10 MED ORDER — CYANOCOBALAMIN 1000 MCG/ML IJ SOLN
1000.0000 ug | Freq: Once | INTRAMUSCULAR | Status: AC
  Administered 2016-07-10: 1000 ug via INTRAMUSCULAR
  Filled 2016-07-10: qty 1

## 2016-07-10 NOTE — Progress Notes (Signed)
Office Visit Note  Patient: Jamie Bennett             Date of Birth: 11-16-1960           MRN: [id number]             PCP: Lavera Guise, MD Referring: Lavera Guise, MD Visit Date: 07/12/2016 Occupation: '@GUAROCC'$ @    Subjective:  Pain of the Right Hip; Pain of the Left Hip; Pain of the Right Knee; Pain of the Left Knee; Pain of the Right Foot; and Pain of the Left Foot Has history of rheumatoid arthritis is on Plaquenil and has fibromyalgia  History of Present Illness: Jamie Bennett is a 55 y.o. female   Last seen 01/21/2016  Patient's Plaquenil eye exam was done on 02/29/2016 and was negative. She saw [facility], Dr. Tempie Donning. We'll follow with them in a year. We agreeable.  Patient is doing well with her RA. She has no joint pain swelling or stiffness or synovitis  She is taking her medication as prescribed: Plaquenil 20 mg twice a day Monday through Friday.  History of left shoulder joint bursitis on the last visit. I gave her cortisone injection in her left shoulder joint and she is doing much better at this time.  Please see June 2017 note in [facility] for full details(she overdosed on Provigil 11/09/2015; we do not give patient Provigil anymore)   Activities of Daily Living:  Patient reports morning stiffness for 15 minutes.   Patient Reports nocturnal pain.  Difficulty dressing/grooming: Reports Difficulty climbing stairs: Reports Difficulty getting out of chair: Reports Difficulty using hands for taps, buttons, cutlery, and/or writing: Reports   Review of Systems  Constitutional: Negative for fatigue.  HENT: Negative for mouth sores and mouth dryness.   Eyes: Negative for dryness.  Respiratory: Negative for shortness of breath.   Gastrointestinal: Negative for constipation and diarrhea.  Musculoskeletal: Negative for myalgias and myalgias.  Skin: Negative for sensitivity to sunlight.  Psychiatric/Behavioral: Negative for decreased concentration and  sleep disturbance.    PMFS History:  Patient Active Problem List   Diagnosis Date Noted  . Suicide attempt 07/10/2016  . Fibromyalgia 07/10/2016  . High risk medication use 07/10/2016  . AKI (acute kidney injury) ([place]) 11/09/2015  . Elevated troponin 11/09/2015  . Hypotension 11/09/2015  . Respiratory failure ([place])   . Acute hepatic failure 11/08/2015  . Drug overdose 11/08/2015  . Fatty infiltration of liver 02/16/2015  . Hepatic fibrosis ([place]) 02/16/2015  . Abnormal serum level of alkaline phosphatase 02/15/2015  . Iron deficiency anemia 02/05/2015  . Vitamin B 12 deficiency 02/05/2015  . OP (osteoporosis) 06/16/2014  . Rheumatoid arteritis 03/02/2014  . Osteoarthritis of left hip 03/02/2014  . Hypothyroidism 03/02/2014  . Rheumatic fever without heart involvement 03/02/2014  . Adult hypothyroidism 03/02/2014  . Arthritis of pelvic region, degenerative 03/02/2014  . Bipolar 1 disorder, depressed ([place]) 02/26/2014  . Bariatric surgery status 11/24/2013  . Affective bipolar disorder ([place]) 11/24/2013  . Bipolar affective disorder ([place]) 11/24/2013  . Rheumatoid arthritis ([place]) 11/24/2013  . Polysubstance (excluding opioids) dependence ([place]) 09/11/2013  . Polysubstance dependence ([place]) 09/11/2013  . Combined drug dependence excluding opioids ([place]) 09/11/2013  . Arthritis or polyarthritis, rheumatoid ([place]) 09/05/2013  . Leg weakness 09/05/2013    Past Medical History:  Diagnosis Date  . Anemia   . Anxiety   . Bipolar 1 disorder ([place])   . Collagen vascular disease ([place])    rhematoid arthritis  .  Constipation   . Fibromyalgia   . Hypotension   . Osteoarthritis   . Osteoporosis   . Rheumatoid arthritis ([place])   . Thyroid disease     Family History  Problem Relation Age of Onset  . Depression Mother   . Dementia Mother   . Heart disease Mother   . Parkinson's disease Father    Past Surgical History:  Procedure Laterality Date  . CHOLECYSTECTOMY    . GASTRIC BYPASS      Social History   Social History Narrative  . No narrative on file     Objective: Vital Signs: BP 130/86   Pulse 78   Resp 14   Ht _0  (1.676 m)   Wt 293 lb (132.9 kg)   BMI 47.29 kg/m     Physical Exam  Constitutional: She is oriented to person, place, and time. She appears well-developed and well-nourished.  HENT:  Head: Normocephalic and atraumatic.  Eyes: EOM are normal. Pupils are equal, round, and reactive to light.  Cardiovascular: Normal rate, regular rhythm and normal heart sounds.  Exam reveals no gallop and no friction rub.   No murmur heard. Pulmonary/Chest: Effort normal and breath sounds normal. She has no wheezes. She has no rales.  Abdominal: Soft. Bowel sounds are normal. She exhibits no distension. There is no tenderness. There is no guarding. No hernia.  Musculoskeletal: Normal range of motion. She exhibits no edema, tenderness or deformity.  Lymphadenopathy:    She has no cervical adenopathy.  Neurological: She is alert and oriented to person, place, and time. Coordination normal.  Skin: Skin is warm and dry. Capillary refill takes less than 2 seconds. No rash noted.  Psychiatric: She has a normal mood and affect. Her behavior is normal.  Nursing note and vitals reviewed.    Musculoskeletal Exam:  Full range of motion of all joints Grip strength is equal and strong bilaterally Fibromyalgia tender points are 18 out of 18 positive  CDAI Exam: CDAI Homunculus Exam:   Joint Counts:  CDAI Tender Joint count: 0 CDAI Swollen Joint count: 0   No synovitis on examination  Investigation: Findings:  Normal Plaquenil eye exam  02/29/16  Orders Only on 07/10/2016  Component Date Value Ref Range Status  . WBC 07/10/2016 4.2  3.6 - 11.0 K/uL Final  . RBC 07/10/2016 4.32  3.80 - 5.20 MIL/uL Final  . Hemoglobin 07/10/2016 13.5  12.0 - 16.0 g/dL Final  . HCT 07/10/2016 40.2  35.0 - 47.0 % Final  . MCV 07/10/2016 93.0  80.0 - 100.0 fL Final  . MCH  07/10/2016 31.3  26.0 - 34.0 pg Final  . MCHC 07/10/2016 33.6  32.0 - 36.0 g/dL Final  . RDW 07/10/2016 12.8  11.5 - 14.5 % Final  . Platelets 07/10/2016 246  150 - 440 K/uL Final  . Neutrophils Relative % 07/10/2016 46  % Final  . Neutro Abs 07/10/2016 1.9  1.4 - 6.5 K/uL Final  . Lymphocytes Relative 07/10/2016 38  % Final  . Lymphs Abs 07/10/2016 1.6  1.0 - 3.6 K/uL Final  . Monocytes Relative 07/10/2016 10  % Final  . Monocytes Absolute 07/10/2016 0.4  0.2 - 0.9 K/uL Final  . Eosinophils Relative 07/10/2016 6  % Final  . Eosinophils Absolute 07/10/2016 0.2  0 - 0.7 K/uL Final  . Basophils Relative 07/10/2016 0  % Final  . Basophils Absolute 07/10/2016 0.0  0 - 0.1 K/uL Final  . Ferritin 07/10/2016 73  11 -  307 ng/mL Final  Orders Only on 06/29/2016  Component Date Value Ref Range Status  . WBC 06/29/2016 5.5  3.8 - 10.8 K/uL Final  . RBC 06/29/2016 3.89  3.80 - 5.10 MIL/uL Final  . Hemoglobin 06/29/2016 12.0  11.7 - 15.5 g/dL Final  . HCT 06/29/2016 36.6  35.0 - 45.0 % Final  . MCV 06/29/2016 94.1  80.0 - 100.0 fL Final  . MCH 06/29/2016 30.8  27.0 - 33.0 pg Final  . MCHC 06/29/2016 32.8  32.0 - 36.0 g/dL Final  . RDW 06/29/2016 12.3  11.0 - 15.0 % Final  . Platelets 06/29/2016 232  140 - 400 K/uL Final  . MPV 06/29/2016 9.3  7.5 - 12.5 fL Final  . Neutro Abs 06/29/2016 2805  1,500 - 7,800 cells/uL Final  . Lymphs Abs 06/29/2016 1980  850 - 3,900 cells/uL Final  . Monocytes Absolute 06/29/2016 440  200 - 950 cells/uL Final  . Eosinophils Absolute 06/29/2016 275  15 - 500 cells/uL Final  . Basophils Absolute 06/29/2016 0  0 - 200 cells/uL Final  . Neutrophils Relative % 06/29/2016 51  % Final  . Lymphocytes Relative 06/29/2016 36  % Final  . Monocytes Relative 06/29/2016 8  % Final  . Eosinophils Relative 06/29/2016 5  % Final  . Basophils Relative 06/29/2016 0  % Final  . Smear Review 06/29/2016 Criteria for review not met   Final  . Sodium 06/29/2016 139  135 - 146 mmol/L  Final  . Potassium 06/29/2016 4.5  3.5 - 5.3 mmol/L Final  . Chloride 06/29/2016 106  98 - 110 mmol/L Final  . CO2 06/29/2016 25  20 - 31 mmol/L Final  . Glucose, Bld 06/29/2016 102* 65 - 99 mg/dL Final  . BUN 06/29/2016 13  7 - 25 mg/dL Final  . Creat 06/29/2016 0.92  0.50 - 1.05 mg/dL Final   Comment:   For patients > or = 56 years of age: The upper reference limit for Creatinine is approximately 13% higher for people identified as African-American.     . Total Bilirubin 06/29/2016 0.3  0.2 - 1.2 mg/dL Final  . Alkaline Phosphatase 06/29/2016 107  33 - 130 U/L Final  . AST 06/29/2016 14  10 - 35 U/L Final  . ALT 06/29/2016 18  6 - 29 U/L Final  . Total Protein 06/29/2016 5.9* 6.1 - 8.1 g/dL Final  . Albumin 06/29/2016 3.6  3.6 - 5.1 g/dL Final  . Calcium 06/29/2016 9.0  8.6 - 10.4 mg/dL Final     Imaging: No results found.  Speciality Comments: No specialty comments available.    Procedures:  Large Joint Inj Date/Time: 07/12/2016 1:55 PM Performed by: Eliezer Lofts Authorized by: Eliezer Lofts   Consent Given by:  Patient Site marked: the procedure site was marked   Timeout: prior to procedure the correct patient, procedure, and site was verified   Indications:  Pain and joint swelling Location:  Knee Site:  R knee Prep: patient was prepped and draped in usual sterile fashion   Needle Size:  27 G Needle Length:  1.5 inches Approach:  Medial Ultrasound Guidance: No   Fluoroscopic Guidance: No   Arthrogram: No   Medications:  1.5 mL lidocaine 1 %; 40 mg triamcinolone acetonide 40 MG/ML Aspiration Attempted: Yes   Patient tolerance:  Patient tolerated the procedure well with no immediate complications    Allergies: Patient has no known allergies.   Assessment / Plan:     Visit Diagnoses:  Rheumatoid arthritis involving multiple sites with positive rheumatoid factor (HCC)  Polysubstance dependence ([place])  Suicide attempt  Fibromyalgia  High risk  medication use - normal plaquenil eye exam 02/29/16  AKI (acute kidney injury) ([place])   CBC with differential CMP with GFR in 5 months.  Note: Patient's CMP with GFR done 07/10/2016 is normal. AndCBC with differential was done 06/29/2016 is within normal limitsi  Refill Plaquenil 2 mg twice a day Monday through Friday; ninety-day supply with 1 refill  Plaquenil eye exam due August 2018.  Rx: bilateral cmc brace.  Apply for visco gel both knees starting jan 2018  Advise water aerobics.  Orders: No orders of the defined types were placed in this encounter.  No orders of the defined types were placed in this encounter.   Face-to-face time spent with patient was 30 minutes. 50% of time was spent in counseling and coordination of care.  Follow-Up Instructions: Return in about 5 months (around 12/10/2016).   Eliezer Lofts, PA-C   I examined and evaluated the patient with Eliezer Lofts PA. The plan of care was discussed as noted above.  Bo Merino, MD

## 2016-07-11 DIAGNOSIS — J209 Acute bronchitis, unspecified: Secondary | ICD-10-CM | POA: Diagnosis not present

## 2016-07-11 DIAGNOSIS — R05 Cough: Secondary | ICD-10-CM | POA: Diagnosis not present

## 2016-07-11 DIAGNOSIS — E039 Hypothyroidism, unspecified: Secondary | ICD-10-CM | POA: Diagnosis not present

## 2016-07-11 DIAGNOSIS — J309 Allergic rhinitis, unspecified: Secondary | ICD-10-CM | POA: Diagnosis not present

## 2016-07-12 ENCOUNTER — Encounter: Payer: Self-pay | Admitting: Rheumatology

## 2016-07-12 ENCOUNTER — Ambulatory Visit: Payer: Self-pay | Admitting: Licensed Clinical Social Worker

## 2016-07-12 ENCOUNTER — Ambulatory Visit (INDEPENDENT_AMBULATORY_CARE_PROVIDER_SITE_OTHER): Payer: Medicare HMO | Admitting: Rheumatology

## 2016-07-12 ENCOUNTER — Telehealth: Payer: Self-pay | Admitting: *Deleted

## 2016-07-12 VITALS — BP 130/86 | HR 78 | Resp 14 | Ht 66.0 in | Wt 293.0 lb

## 2016-07-12 DIAGNOSIS — T1491XA Suicide attempt, initial encounter: Secondary | ICD-10-CM

## 2016-07-12 DIAGNOSIS — Z79899 Other long term (current) drug therapy: Secondary | ICD-10-CM | POA: Diagnosis not present

## 2016-07-12 DIAGNOSIS — M0579 Rheumatoid arthritis with rheumatoid factor of multiple sites without organ or systems involvement: Secondary | ICD-10-CM

## 2016-07-12 DIAGNOSIS — G8929 Other chronic pain: Secondary | ICD-10-CM

## 2016-07-12 DIAGNOSIS — N179 Acute kidney failure, unspecified: Secondary | ICD-10-CM | POA: Diagnosis not present

## 2016-07-12 DIAGNOSIS — M25561 Pain in right knee: Secondary | ICD-10-CM

## 2016-07-12 DIAGNOSIS — M797 Fibromyalgia: Secondary | ICD-10-CM | POA: Diagnosis not present

## 2016-07-12 DIAGNOSIS — R69 Illness, unspecified: Secondary | ICD-10-CM | POA: Diagnosis not present

## 2016-07-12 DIAGNOSIS — F1920 Other psychoactive substance dependence, uncomplicated: Secondary | ICD-10-CM | POA: Diagnosis not present

## 2016-07-12 MED ORDER — METHOCARBAMOL 500 MG PO TABS: ORAL_TABLET | ORAL | 3 refills | Status: DC

## 2016-07-12 MED ORDER — LIDOCAINE HCL 1 % IJ SOLN
1.5000 mL | INTRAMUSCULAR | Status: AC | PRN
  Administered 2016-07-12: 1.5 mL

## 2016-07-12 MED ORDER — TRIAMCINOLONE ACETONIDE 40 MG/ML IJ SUSP
40.0000 mg | INTRAMUSCULAR | Status: AC | PRN
  Administered 2016-07-12: 40 mg via INTRA_ARTICULAR

## 2016-07-12 NOTE — Addendum Note (Signed)
Addended byCandice Camp on: 07/12/2016 03:19 PM   Modules accepted: Orders

## 2016-07-12 NOTE — Telephone Encounter (Signed)
Patient states she was seen in the office today and was under the impression she was to have a muscle relaxer sent to the pharmacy.

## 2016-07-12 NOTE — Telephone Encounter (Signed)
Prescription sent to the pharmacy and patient notified.  

## 2016-07-12 NOTE — Patient Instructions (Signed)

## 2016-07-12 NOTE — Telephone Encounter (Signed)
Yes.  Pls send in: ROBAXIN 500MG  1 at 7 AM when necessary and 1 at 2 PM when necessaryDispense 60 pills with 3 refillsDrowsiness precautions discussed at length in the office

## 2016-07-20 ENCOUNTER — Ambulatory Visit (INDEPENDENT_AMBULATORY_CARE_PROVIDER_SITE_OTHER): Payer: Medicare HMO | Admitting: Licensed Clinical Social Worker

## 2016-07-20 DIAGNOSIS — R69 Illness, unspecified: Secondary | ICD-10-CM | POA: Diagnosis not present

## 2016-07-20 DIAGNOSIS — F3170 Bipolar disorder, currently in remission, most recent episode unspecified: Secondary | ICD-10-CM

## 2016-07-20 DIAGNOSIS — F411 Generalized anxiety disorder: Secondary | ICD-10-CM | POA: Diagnosis not present

## 2016-07-20 DIAGNOSIS — F332 Major depressive disorder, recurrent severe without psychotic features: Secondary | ICD-10-CM | POA: Diagnosis not present

## 2016-07-20 NOTE — Progress Notes (Signed)
Comprehensive Clinical Assessment (CCA) Note  07/20/2016 Jamie Bennett [id number]  Visit Diagnosis:      ICD-9-CM ICD-10-CM   1. GAD (generalized anxiety disorder) 300.02 F41.1   2. Major depressive disorder, recurrent, severe without psychotic features ([place]) 296.33 F33.2   3. Bipolar disorder in full remission, most recent episode unspecified type ([place]) 296.80 F31.70       CCA Part One  Part One has been completed on paper by the patient.  (See scanned document in Chart Review)  CCA Part Two A  Intake/Chief Complaint:  CCA Intake With Chief Complaint CCA Part Two Date: 07/20/16 CCA Part Two Time: 1304 Chief Complaint/Presenting Problem: She had a crisis last night. She has not been happy in a long time. Several years. Sometimes the weight of it catches up with her so she has a drink or two and her husband disapproves. He doesn't drink. When some of the weight is lifted, she feels more free to tell him what is bothering her about him, he is convinced that it is the alcohol and that she is psychotic, but he does not understand that she thinks this way all the time. They have been married 58 years and just realizing he is a Publishing copy. He is afraid of making a mistake about anything, she always feels inferior and he doesn't help. She got on disability  she got on disability three years ago and he lost his job two years ago. She relates problems in communication in general because when she shares how she feels, he gets angry, anxious and panicky and she is not allowed to express anger.  Patients Currently Reported Symptoms/Problems: crisis last night-SIB-the physical hurt takes away the emotional hurt, bites herself, goes in spurts, stressful time everyday and might go a month without, currently about every three months, off and on since 14. She had a couple of drinks, it triggers him to be angry, then he doesn't talk, she slept on the couch, she drank a half pint of vodka over several  hours. 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, hit rock bottom in 2009, not sure why but was not longer getting anything from it, after quit all her friends were teachers so did not have any supports Type of Services Patient Feels Are Needed: individual therapy, medication management Initial Clinical Notes/Concerns: April-Provigil that improved mood, originally taking it because of RA and fibromyalgia because tired, she substituted Tylenol for Provigil and went to ER, after she started to take more than needed her brain didn't register how much she had taken, Kidney, liver failed and stopped breathing, all of her muscles stopped working, ICU-induced coma for five days, after four days and transferred rehab for 28 days, she is back to regular functioning  she cut wrist in the past and was in behavioral unit for a week 2007,  A year and a half ago under tremendous stress related to father-in-law death-cut with a razor blade no stitching Another incident-nervous breakdown-knew husband was going to be angry and hid in the attic, called the police, they didn't know where she was, police took her to ER and spent  a week in behavioral unit-June of last year, Outpatient-she started seeing a psychiatrist in 07, she tried three different counselors, 07-it was more like marriage counseling and not delving into her issues, another counselor just tried to get her a job, shared another experience of husband going in with psychiatrist and she said that she didn't even need to be there,  Mental Health  Symptoms Depression:  Depression: Change in energy/activity, Fatigue, Hopelessness, Increase/decrease in appetite, Tearfulness, Weight gain/loss, Worthlessness (denies SI, sleep better because of meds)  Mania:  Mania: N/A (diagnosed with biplar but denies current symptoms)  Anxiety:   Anxiety: Fatigue, Irritability, Tension (tension headaches, if husband gets anxious or panicky she does too)  Psychosis:  Psychosis: N/A  Trauma:  Trauma: N/A  Obsessions:  Obsessions: N/A  Compulsions:  Compulsions: N/A  Inattention:  Inattention: N/A  Hyperactivity/Impulsivity:  Hyperactivity/Impulsivity: N/A  Oppositional/Defiant Behaviors:     Borderline Personality:  Emotional Irregularity: Chronic feelings of emptiness, Mood lability, Potentially harmful impulsivity, Unstable self-image  Other Mood/Personality Symptoms:      Mental Status Exam Appearance and self-care  Stature:  Stature: Average  Weight:  Weight: Overweight (gastric bi-bass 188 lls able to keep it off for twelve, started to put on weight two years ago)  Clothing:  Clothing: Casual  Grooming:  Grooming: Normal  Cosmetic use:  Cosmetic Use: None  Posture/gait:  Posture/Gait: Normal  Motor activity:  Motor Activity: Not Remarkable  Sensorium  Attention:  Attention: Normal  Concentration:  Concentration: Normal  Orientation:  Orientation: X5  Recall/memory:  Recall/Memory: Normal  Affect and Mood  Affect:  Affect: Appropriate  Mood:  Mood: Depressed, Anxious  Relating  Eye contact:  Eye Contact: Normal  Facial expression:  Facial Expression: Responsive  Attitude toward examiner:  Attitude Toward Examiner: Cooperative  Thought and Language  Speech flow: Speech Flow: Normal  Thought content:  Thought Content: Appropriate to mood and circumstances  Preoccupation:     Hallucinations:     Organization:     Transport planner of Knowledge:  Fund of Knowledge: Average  Intelligence:  Intelligence: Average  Abstraction:   Abstraction: Normal  Judgement:  Judgement: Fair  Art therapist:  Reality Testing: Realistic  Insight:  Insight: Fair  Decision Making:  Decision Making: Impulsive, Paralyzed  Social Functioning  Social Maturity:  Social Maturity: Isolates  Social Judgement:  Social Judgement: Normal  Stress  Stressors:  Stressors: Family conflict (dealing with mother-in-law and husband, relationship with son)  Coping Ability:  Coping Ability: Research officer, political party Deficits:     Supports:      Family and Psychosocial History: Family history Marital status: Married Number of Years Married: 88 What types of issues is patient dealing with in the relationship?: husband is controlling, he is derogatory and doesn't see his part in things, living with husband, supports-parents, live in [place], their health is bad, brother and his wife Additional relationship information: n/a Are you sexually active?: No What is your sexual orientation?: heterosexual Has your sexual activity been affected by drugs, alcohol, medication, or emotional stress?: emotional stress Does patient have children?: Yes How many children?: 1 How is patient's relationship with their children?: son is married with wife and one grandchildren in [place], like husband and disappointed in things that she has done, he says because of that he can't leave grandson alone with her and she understands this, grandson is 69, mother-in-law has dementia and has to pick her up and gets anxious and stressed about the situation,  she used to have patience, she gets angry and takes it out on husband  Childhood History:  Childhood History By whom was/is the patient raised?: Both parents Additional childhood history information: father was in the navy-one set of rules when home and another when gone-more strict when he was home, never had close friends because of moving a lot, lonely Description of patient's relationship with caregiver when they were a child:  mom-good, dad-stubborn Patient's description of current relationship with people who raised him/her: parents are some support, Dad is very ill and has Parkinson's disease, mom-heart problems, both are 32 and she doesn't think they have a lot of time left How were you disciplined when you got in trouble as a child/adolescent?: father would whip them, mom grounded them Does patient have siblings?: Yes Number of Siblings: 1 Description of patient's current relationship with siblings: younger brother and good relationship even thought did not get along as kids Did patient suffer any verbal/emotional/physical/sexual abuse as a child?: No Did patient suffer from severe childhood neglect?: No Has patient ever been sexually abused/assaulted/raped as an adolescent or adult?: No Was the patient ever a victim of a crime or a disaster?: No Witnessed domestic violence?: No Has patient been effected by domestic violence as an adult?: No  CCA Part Two B  Employment/Work Situation: Employment / Work Copywriter, advertising Employment situation: On disability Why is patient on disability: mental health-bipolar and rheumotoid arthritis How long has patient been on disability: almost three years What is the longest time patient has a held a job?: 12 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, 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, special education Has patient ever been in the [employer]?: No Has patient ever served in combat?: No Did You Receive Any Psychiatric Treatment/Services While in Passenger transport manager?: No Are There Guns or Other Weapons in [place]?: No  Education: Museum/gallery curator Currently Attending: no Last Grade Completed: 16 Name of [place]: [place] Did Teacher, adult education From [employer]?: Yes Did You Attend College?: Yes What Type of College Degree Do you Have?: B.A elementary education Did [place]?: No 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 Did You Have An Individualized Education Program (IIEP):  No Did You Have Any Difficulty At School?: No  Religion: Religion/Spirituality Are You A Religious Person?: No (raised strict Baptist)  Leisure/Recreation: Leisure / Recreation Leisure and Hobbies: reading and crafts, long-time volunteered for [employer] Background  Exercise/Diet: Exercise/Diet Do You Exercise?: No Have You Gained or Lost A Significant Amount of Weight in the Past Six Months?: Yes-Gained Number of Pounds Gained: 60 Do You Follow a Special Diet?: No Do You Have Any Trouble Sleeping?: No  CCA Part Two C  Alcohol/Drug Use: Alcohol / Drug Use History of alcohol / drug use?: Yes Longest period of sobriety (when/how long): 1 year Substance #1 Name of Substance 1: alcohol 1 - Age of First Use: 25  1 - Amount (size/oz): 1/2 pint 1 - Frequency: once every 3 months 1 - Duration: 5 years 1 - Last Use / Amount: last night 07/19/16 2 mixed drinks-1/2 pint                    CCA Part Three  ASAM's:  Six Dimensions of Multidimensional Assessment  Dimension 1:  Acute Intoxication and/or Withdrawal Potential:     Dimension 2:  Biomedical Conditions and Complications:     Dimension 3:  Emotional, Behavioral, or Cognitive Conditions and Complications:     Dimension 4:  Readiness  to Change:     Dimension 5:  Relapse, Continued use, or Continued Problem Potential:     Dimension 6:  Recovery/Living Environment:      Substance use Disorder (SUD)    Social Function:  Social Functioning Social Maturity: Isolates Social Judgement: Normal  Stress:  Stress Stressors: Family conflict (dealing with mother-in-law and husband, relationship with son) Coping Ability: Exhausted Patient Takes Medications The Way The Doctor Instructed?: Yes Priority Risk: Low Acuity  Risk Assessment- Self-Harm Potential: Risk Assessment For Self-Harm Potential Thoughts of Self-Harm: No current thoughts Method: No plan Availability of Means: No  access/NA Additional Information for Self-Harm Potential: Previous Attempts, Acts of Self-harm Additional Comments for Self-Harm Potential: see above  Risk Assessment -Dangerous to Others Potential: Risk Assessment For Dangerous to Others Potential Method: No Plan Availability of Means: No access or NA Intent: Vague intent or NA Notification Required: No need or identified person  DSM5 Diagnoses: Patient Active Problem List   Diagnosis Date Noted  . Suicide attempt 07/10/2016  . Fibromyalgia 07/10/2016  . High risk medication use 07/10/2016  . AKI (acute kidney injury) ([place]) 11/09/2015  . Elevated troponin 11/09/2015  . Hypotension 11/09/2015  . Respiratory failure ([place])   . Acute hepatic failure 11/08/2015  . Drug overdose 11/08/2015  . Fatty infiltration of liver 02/16/2015  . Hepatic fibrosis ([place]) 02/16/2015  . Abnormal serum level of alkaline phosphatase 02/15/2015  . Iron deficiency anemia 02/05/2015  . Vitamin B 12 deficiency 02/05/2015  . OP (osteoporosis) 06/16/2014  . Rheumatoid arteritis 03/02/2014  . Osteoarthritis of left hip 03/02/2014  . Hypothyroidism 03/02/2014  . Rheumatic fever without heart involvement 03/02/2014  . Adult hypothyroidism 03/02/2014  . Arthritis of pelvic region, degenerative 03/02/2014  . Bipolar 1 disorder, depressed ([place]) 02/26/2014  . Bariatric surgery status 11/24/2013  . Affective bipolar disorder ([place]) 11/24/2013  . Bipolar affective disorder ([place]) 11/24/2013  . Rheumatoid arthritis ([place]) 11/24/2013  . Polysubstance (excluding opioids) dependence ([place]) 09/11/2013  . Polysubstance dependence ([place]) 09/11/2013  . Combined drug dependence excluding opioids ([place]) 09/11/2013  . Arthritis or polyarthritis, rheumatoid ([place]) 09/05/2013  . Leg weakness 09/05/2013    Patient Centered Plan: Patient is on the following Treatment Plan(s):  Anxiety, Depression, Impulse Control and Low Self-Esteem, relationship issues  Recommendations for  Services/Supports/Treatments: Recommendations for Services/Supports/Treatments Recommendations For Services/Supports/Treatments: Individual Therapy, Medication Management  Treatment Plan Summary: Patient is a 55 year old married female who was referred for therapy by her psychiatrist, Dr. Einar Grad. She describes a "crisis" last night and that she has not been happy for several years. She relates that last night was an instance of experiencing increase in intensity of depressive symptoms, that leads to having a couple of drinks, that causes conflict in her relationship, that led to self-injurious behavior that she describes as biting herself. She relates that she has had self injurious behavior of biting herself on and off since age 58 and currently about every 3 months. She reports one incidence of self-injurious behavior by cutting a year and half ago and "nervous breakdown" in which she was hospitalized. She reports current depressive symptoms including lack of motivation, and poor self-esteem but denies current SI. Her medical issues of rheumatoid arthritis and fibromyalgia contribute symptoms of fatigue. She has a diagnosis of bipolar but denies current symptoms of elevated mood. She reports a past SA in 2007 in which she was hospitalized and hospitalized and had severe medical consequences from impulsive but unintentional OD on Provigil last April. She agrees to go  to emergency room or call 911 if she has thoughts of suicide. She also reports symptoms of anxiety. Her current stressors include conflict with her husband, stress in her relationship with son and dealing with her mother-in-law. She gives a history of drinking every 3 months, 2 drinks of approximately half a pint of vodka. She is recommended for individual therapy to work on emotional regulation skills, relationship issues, self-esteem issues, supportive interventions and continue medication management.    Referrals to Alternative  Service(s): Referred to Alternative Service(s):   Place:   Date:   Time:    Referred to Alternative Service(s):   Place:   Date:   Time:    Referred to Alternative Service(s):   Place:   Date:   Time:    Referred to Alternative Service(s):   Place:   Date:   Time:     Bowman,Mary A

## 2016-07-21 ENCOUNTER — Other Ambulatory Visit: Payer: Self-pay | Admitting: Psychiatry

## 2016-07-22 ENCOUNTER — Other Ambulatory Visit: Payer: Self-pay | Admitting: Rheumatology

## 2016-07-26 NOTE — Telephone Encounter (Signed)
Patient's Plaquenil eye exam was done on 02/29/2016 and was negative. Labs normal 06/29/16  last visit  07/20/16 12/12/16 next visit Ok to refill per Dr Estanislado Pandy

## 2016-07-27 NOTE — Telephone Encounter (Signed)
left message on doctor's line voice mail that ok to refill the hydroxyzine hcl 10mg  take one tablety by mouth three times daily as needed #30 no additional refills

## 2016-07-27 NOTE — Telephone Encounter (Signed)
received a fax requesting a refill on hydroxyzine 10mg .

## 2016-08-01 DIAGNOSIS — J0100 Acute maxillary sinusitis, unspecified: Secondary | ICD-10-CM | POA: Diagnosis not present

## 2016-08-02 ENCOUNTER — Ambulatory Visit (INDEPENDENT_AMBULATORY_CARE_PROVIDER_SITE_OTHER): Payer: Medicare HMO | Admitting: Licensed Clinical Social Worker

## 2016-08-02 DIAGNOSIS — F332 Major depressive disorder, recurrent severe without psychotic features: Secondary | ICD-10-CM

## 2016-08-02 DIAGNOSIS — F3170 Bipolar disorder, currently in remission, most recent episode unspecified: Secondary | ICD-10-CM | POA: Diagnosis not present

## 2016-08-02 DIAGNOSIS — R69 Illness, unspecified: Secondary | ICD-10-CM | POA: Diagnosis not present

## 2016-08-02 DIAGNOSIS — F411 Generalized anxiety disorder: Secondary | ICD-10-CM

## 2016-08-02 NOTE — Progress Notes (Signed)
   THERAPIST PROGRESS NOTE  Session Time: 2:05 PM to 3 PM  Participation Level: Active  Behavioral Response: CasualAlertEuthymic  Type of Therapy: Individual Therapy  Treatment Goals addressed:  increase self-esteem, decreased mood, and work on more independence  Interventions: Solution Focused, Strength-based, Supportive, Family Systems and Other: Stress management, skills to build self-esteem  Summary: Jamie Bennett is a 56 y.o. female who presents with discussion of stressors over the holidays. This included stress of mother-in-law, hasn't seen grandson, her parents are home bound, and dad has advanced Parkinson. Shared that it breaks her heart and wishes there was more that she could do. Shared that her mother had told her life that it doesn't get better, it gets different. Therapist pointed out that we can work to have better problems. Related that every little stress adds up and that she has the stress of medical issues. Related that holidays are stressful and this one was particularly stressful. Discussed more about her husband's traits and he explains to her that he will do things and keep doing them so not to do the wrong thing. He is trying to lead by example, and she just wants her to tell her directly. She is starting to take steps to challenge his authority and the dynamic of the relationship has been he has been more dominant. She described how there is concern about her doing her own thing, and not letting her go by herself to an appointment or other activity. He doesn't trust her but says that it goes back and forth between having more freedom and less. She has some understanding of his concerns, acknowledges that she has drunk to excess but still has the need for more freedom in her relationship. Completed treatment plan and she wants to work on increase in self-esteem, more independence and improvement in mood.  Suicidal/Homicidal: No  Therapist Response: Discussed stress  management skills and recognition there is only so much she can do to fix situations. Encouraged patient to embrace her unique qualities and to enjoy being her own person. Provided positive feedback for taking initial steps to implement skills of assertiveness and shared that she will improve skills by practicing. Discussed that trust in in the relationship is built over time and and validated patient on her well to be more independent as being good for her well-being. Completed treatment plan in session and provided supportive and strength-based intervention. Marland Kitchen spilled over time is is her own person, trust built, indepenence well-being  Plan: Return again in 2 weeks.2. Patient is going to take steps to engage in activities she enjoys and to keep track of this so she can share with therapist at next appointment 3. Patient work on Geographical information systems officer and skills to build self-esteem  Diagnosis: Axis I:  major depressive disorder, recurrent, severe, generalized anxiety disorder, bipolar disorder in full remission most recent episode unspecified    Axis II: No diagnosis    Blannie Shedlock A, LCSW 08/02/2016

## 2016-08-07 ENCOUNTER — Inpatient Hospital Stay: Payer: Medicare HMO | Attending: Hematology and Oncology

## 2016-08-07 DIAGNOSIS — E538 Deficiency of other specified B group vitamins: Secondary | ICD-10-CM | POA: Insufficient documentation

## 2016-08-07 DIAGNOSIS — Z79899 Other long term (current) drug therapy: Secondary | ICD-10-CM | POA: Insufficient documentation

## 2016-08-07 MED ORDER — CYANOCOBALAMIN 1000 MCG/ML IJ SOLN
1000.0000 ug | Freq: Once | INTRAMUSCULAR | Status: AC
  Administered 2016-08-07: 1000 ug via INTRAMUSCULAR
  Filled 2016-08-07: qty 1

## 2016-08-15 DIAGNOSIS — M79641 Pain in right hand: Secondary | ICD-10-CM | POA: Diagnosis not present

## 2016-08-15 DIAGNOSIS — M069 Rheumatoid arthritis, unspecified: Secondary | ICD-10-CM | POA: Diagnosis not present

## 2016-08-21 ENCOUNTER — Telehealth: Payer: Self-pay

## 2016-08-21 NOTE — Telephone Encounter (Signed)
pt called states that her appt had to be moved because dr. Einar Grad was not back yet.  pt will not have enought medication to last until next appt.

## 2016-08-21 NOTE — Telephone Encounter (Signed)
left message on doctor's line rx for the vistaril 10mg  pt was instraucted to take three times a day as needed #90  with no additional refill

## 2016-08-22 NOTE — Telephone Encounter (Signed)
left message on doctor line for the trazodone and for the hydroxyzine to be refill with no additional

## 2016-08-24 ENCOUNTER — Ambulatory Visit: Payer: Medicare HMO | Admitting: Psychiatry

## 2016-08-24 DIAGNOSIS — R69 Illness, unspecified: Secondary | ICD-10-CM | POA: Diagnosis not present

## 2016-08-30 ENCOUNTER — Ambulatory Visit: Payer: Medicare HMO | Admitting: Licensed Clinical Social Worker

## 2016-08-30 ENCOUNTER — Other Ambulatory Visit: Payer: Self-pay | Admitting: Psychiatry

## 2016-08-31 DIAGNOSIS — R05 Cough: Secondary | ICD-10-CM | POA: Diagnosis not present

## 2016-08-31 DIAGNOSIS — J069 Acute upper respiratory infection, unspecified: Secondary | ICD-10-CM | POA: Diagnosis not present

## 2016-08-31 DIAGNOSIS — R062 Wheezing: Secondary | ICD-10-CM | POA: Diagnosis not present

## 2016-09-04 ENCOUNTER — Inpatient Hospital Stay: Payer: Medicare HMO | Attending: Hematology and Oncology

## 2016-09-04 DIAGNOSIS — Z79899 Other long term (current) drug therapy: Secondary | ICD-10-CM | POA: Insufficient documentation

## 2016-09-04 DIAGNOSIS — E538 Deficiency of other specified B group vitamins: Secondary | ICD-10-CM | POA: Insufficient documentation

## 2016-09-04 MED ORDER — CYANOCOBALAMIN 1000 MCG/ML IJ SOLN
1000.0000 ug | Freq: Once | INTRAMUSCULAR | Status: AC
  Administered 2016-09-04: 1000 ug via INTRAMUSCULAR
  Filled 2016-09-04: qty 1

## 2016-09-07 ENCOUNTER — Ambulatory Visit (INDEPENDENT_AMBULATORY_CARE_PROVIDER_SITE_OTHER): Payer: Medicare HMO | Admitting: Licensed Clinical Social Worker

## 2016-09-07 ENCOUNTER — Ambulatory Visit: Payer: Medicare HMO | Admitting: Psychiatry

## 2016-09-07 ENCOUNTER — Encounter: Payer: Self-pay | Admitting: Psychiatry

## 2016-09-07 VITALS — BP 133/85 | HR 89 | Temp 98.6°F | Wt 297.4 lb

## 2016-09-07 DIAGNOSIS — F332 Major depressive disorder, recurrent severe without psychotic features: Secondary | ICD-10-CM

## 2016-09-07 DIAGNOSIS — F411 Generalized anxiety disorder: Secondary | ICD-10-CM

## 2016-09-07 DIAGNOSIS — F3170 Bipolar disorder, currently in remission, most recent episode unspecified: Secondary | ICD-10-CM

## 2016-09-07 DIAGNOSIS — R69 Illness, unspecified: Secondary | ICD-10-CM | POA: Diagnosis not present

## 2016-09-07 MED ORDER — TRAZODONE HCL 100 MG PO TABS: ORAL_TABLET | ORAL | 1 refills | Status: DC

## 2016-09-07 MED ORDER — LAMOTRIGINE 150 MG PO TABS: 150.0000 mg | ORAL_TABLET | Freq: Two times a day (BID) | ORAL | 1 refills | Status: DC

## 2016-09-07 MED ORDER — VENLAFAXINE HCL 75 MG PO TABS: 225.0000 mg | ORAL_TABLET | Freq: Every morning | ORAL | 2 refills | Status: DC

## 2016-09-07 NOTE — Progress Notes (Signed)
   THERAPIST PROGRESS NOTE  Session Time: 9:05 AM to 10 AM  Participation Level: Active  Behavioral Response: CasualAlertEuthymic  Type of Therapy: Individual Therapy  Treatment Goals addressed: increase self-esteem, decreased mood, and work on more independence  Interventions: Solution Focused, Strength-based, Supportive and Other: Skills to build self-esteem, skills and healthy interpersonal relationships  Summary: Jamie Bennett is a 56 y.o. female who presents with feeling pretty good and doing things she likes everyday. She is doing adult coloring. She is absorbed in the activity and that it is not the product but the process that is satisfying. She also has enjoyed doing crossword puzzles on the computer. She continues in subtle ways to be more assertive and she is taking small steps as going too fast would not help in the process of change. She describes that he is passively aggressive when she is being assertive and she encourages him to communicate how he is feeling. Described the difficulty of in the past being completely independent and now her husband feels he has to be constant companion. She describes her husband always has to be right, and understands better that this is because of being afraid of being wrong. Related that she can't get angry around him because it is too upsetting to him. Discussed with therapist importance of trust and relationship relating to her partner her need of more independence her own well-being. Encouraged patient in communicating her needs as a healthy aspect of the relationship. Identified patient feeling slightly inferior relationship. Explained the importance of feeling as an equal partner to help get her needs met. She described anxiety in the evenings and therapist presented worksheet on mindfulness. Patient relates she is a person that she likes having experiences where she is able to enjoy the present. Reviewed session and patient related she is  working on trying to get more independence, learning about mindfulness and discussion of recognizing her own value.   Suicidal/Homicidal: No  Therapist Response: Reviewed progress and symptoms. Provided positive feedback for patient engaging in activities that help increase positive mood.  Introduced mindfulness as a way for patient to deal with anxiety in the evening.  Explained that mindfulness means paying attention in a particular way, on purpose, and the present moment, non-judgmentally. Discussed how it helps one distance oneself from distressing thoughts and had husband have more control over these thoughts.  Encouraged patient on her steps she is taking to implement healthy relationship skills.  Identified gaining more independence as important in gaining her needs met.  Help patient to process feelings related to  stressors of the relationship and coping strategies to manage stressors. Discussed core self-esteem as well as recognition of patient's unconditional self-worth and coming to understand she is equal partner in the relationship. Provided supportive and strength-based interventions.  Plan: Return again in 1 month. 2.Patient read handout "The Basics of Human Worth" 2.Patient review Ted Talk video "Skills for Health Romantic Relationships" by Lynetta Mare  Diagnosis: Axis I: major depressive disorder, recurrent, severe, generalized anxiety disorder, bipolar disorder in full remission most recent episode unspecified    Axis II: No diagnosis    Bowman,Mary A, LCSW 09/07/2016

## 2016-09-07 NOTE — Progress Notes (Signed)
Patient ID: Jamie Bennett, female   DOB: November 18, 1960, 56 y.o.   MRN: [id number] [facility] MD/PA/NP OP Progress Note  09/07/2016 11:40 AM Jamie Bennett  MRN:  [id number]  Subjective:  Patient returns for follow-up of her generalized anxiety disorder and  bipolar disorder. Reports doing well mood wise. states the increase in Lamictal to 400 has caused headaches.. So patient went down on the Lamictal back to 300 daily. States that her mood has been more stable after Christmas but she's been having anxiety towards the end of the day. Denies any stressors for the anxiety. Sleeping well. Appetite is good. Unable to do any physical exercise. States that she has gotten to be deconditioned and this is been difficult. Denies any suicidal thoughts.    Chief Complaint: anxiety in the evening  Visit Diagnosis:     ICD-9-CM ICD-10-CM   1. GAD (generalized anxiety disorder) 300.02 F41.1   2. Major depressive disorder, recurrent, severe without psychotic features ([place]) 296.33 F33.2     Past Medical History:  Past Medical History:  Diagnosis Date  . Anemia   . Anxiety   . Bipolar 1 disorder ([place])   . Collagen vascular disease ([place])    rhematoid arthritis  . Constipation   . Fibromyalgia   . Hypotension   . Osteoarthritis   . Osteoporosis   . Rheumatoid arthritis ([place])   . Thyroid disease     Past Surgical History:  Procedure Laterality Date  . CHOLECYSTECTOMY    . GASTRIC BYPASS     Family History:  Family History  Problem Relation Age of Onset  . Depression Mother   . Dementia Mother   . Heart disease Mother   . Parkinson's disease Father    Social History:  Social History   Social History  . Marital status: Married    Spouse name: N/A  . Number of children: N/A  . Years of education: N/A   Social History Main Topics  . Smoking status: Never Smoker  . Smokeless tobacco: Never Used  . Alcohol use No  . Drug use: No  . Sexual activity: Yes   Other Topics Concern  . Not  on file   Social History Narrative  . No narrative on file   Additional History:   Assessment:   Musculoskeletal: Strength & Muscle Tone: within normal limits Gait & Station: normal Patient leans: N/A  Psychiatric Specialty Exam: Medication Refill     Review of Systems  Psychiatric/Behavioral: Negative for depression, hallucinations, memory loss, substance abuse and suicidal ideas. The patient is nervous/anxious. The patient does not have insomnia.   All other systems reviewed and are negative.   There were no vitals taken for this visit.There is no height or weight on file to calculate BMI.  General Appearance: Fairly Groomed  Eye Contact:  Good  Speech:  Normal Rate  Volume:  Normal  Mood:  stable  Affect:  normal  Thought Process:  Circumstantial  Orientation:  Full (Time, Place, and Person)  Thought Content:  Negative  Suicidal Thoughts:  No  Homicidal Thoughts:  No  Memory:  Immediate;   Good Recent;   Good Remote;   Good  Judgement:  Good  Insight:  Fair  Psychomotor Activity:  Negative  Concentration:  Good  Recall:  Good  Fund of Knowledge: Good  Language: Good  Akathisia:  Negative  Handed:   AIMS (if indicated):    Assets:  Desire for Improvement Social Support  ADL's:  Intact  Cognition: WNL  Sleep:  good   Is the patient at risk to self?  No. Has the patient been a risk to self in the past 6 months?  No. Has the patient been a risk to self within the distant past?  Yes.   Is the patient a risk to others?  No. Has the patient been a risk to others in the past 6 months?  No. Has the patient been a risk to others within the distant past?  No.  Current Medications: Current Outpatient Prescriptions  Medication Sig Dispense Refill  . Cholecalciferol (HM VITAMIN D3) 4000 units CAPS Take 4,000 capsules by mouth daily.    . cyanocobalamin (,VITAMIN B-12,) 1000 MCG/ML injection Inject 1,000 mcg into the muscle every 30 (thirty) days.    Marland Kitchen  esomeprazole (NEXIUM) 40 MG capsule Take 1 capsule (40 mg total) by mouth daily at 12 noon.    . Fiber, Guar Gum, CHEW Chew 10 mg by mouth daily.    . fluticasone (FLONASE) 50 MCG/ACT nasal spray Place 1 spray into both nostrils daily.     . hydroxychloroquine (PLAQUENIL) 200 MG tablet TAKE ONE TABLET BY MOUTH TWICE DAILY MONDAY  THROUGH  FRIDAY 120 tablet 0  . hydrOXYzine (ATARAX/VISTARIL) 10 MG tablet Take 1 tablet (10 mg total) by mouth 3 (three) times daily as needed. 30 tablet 0  . lamoTRIgine (LAMICTAL) 200 MG tablet     . levothyroxine (SYNTHROID, LEVOTHROID) 112 MCG tablet Take 112 mcg by mouth daily.    Marland Kitchen loratadine (CLARITIN) 10 MG tablet 10 mg  prn as needed    . Magnesium 400 MG CAPS Take 400 mg by mouth 2 (two) times daily.     . methocarbamol (ROBAXIN) 500 MG tablet 1 at 7 AM when necessary and 1 at 2 PM PRN 60 tablet 3  . Multiple Vitamin (MULTIVITAMIN WITH MINERALS) TABS tablet Take 1 tablet by mouth daily.    Marland Kitchen sulfamethoxazole-trimethoprim (BACTRIM DS,SEPTRA DS) 800-160 MG tablet     . tacrolimus (PROTOPIC) 0.03 % ointment     . tiZANidine (ZANAFLEX) 4 MG tablet     . traZODone (DESYREL) 100 MG tablet Take 2 tablet at night as needed for insomnia 60 tablet 1  . triamcinolone cream (KENALOG) 0.5 %     . venlafaxine (EFFEXOR) 75 MG tablet Take 3 tablets (225 mg total) by mouth every morning. 90 tablet 2  . zoledronic acid (RECLAST) 5 MG/100ML SOLN injection Inject 5 mg into the vein See admin instructions. Patient takes yearly     No current facility-administered medications for this visit.     Medical Decision Making:  Established Problem, Stable/Improving (1), Review of Medication Regimen & Side Effects (2) and Review of New Medication or Change in Dosage (2)  Treatment Plan Summary:Medication management and Plan Plan   Generalized anxiety disorder- Continue Effexor at 225mg  po qd. Decrease Lamictal to 150mg  po bid.  Insomnia Continue Trazodone to 200mg  po qhs  prn.  Panic symptoms Take Vistaril at 10 mg as needed 1-2 times daily when having panic symptoms  Strongly recommend that patient start seeing a therapist to address her various anxieties and issues with body image.  Return to clinic in  time in 1 months time or call before if necessary      .Rumor Sun 09/07/2016, 11:40 AM

## 2016-09-09 ENCOUNTER — Emergency Department: Payer: No Typology Code available for payment source

## 2016-09-09 ENCOUNTER — Emergency Department
Admission: EM | Admit: 2016-09-09 | Discharge: 2016-09-09 | Disposition: A | Discharge status: Home / Self Care | Payer: No Typology Code available for payment source | Attending: Emergency Medicine | Admitting: Emergency Medicine

## 2016-09-09 ENCOUNTER — Encounter: Payer: Self-pay | Admitting: Emergency Medicine

## 2016-09-09 DIAGNOSIS — S161XXA Strain of muscle, fascia and tendon at neck level, initial encounter: Secondary | ICD-10-CM | POA: Insufficient documentation

## 2016-09-09 DIAGNOSIS — M542 Cervicalgia: Secondary | ICD-10-CM | POA: Diagnosis not present

## 2016-09-09 DIAGNOSIS — Y999 Unspecified external cause status: Secondary | ICD-10-CM | POA: Diagnosis not present

## 2016-09-09 DIAGNOSIS — Z79899 Other long term (current) drug therapy: Secondary | ICD-10-CM | POA: Diagnosis not present

## 2016-09-09 DIAGNOSIS — Y92410 Unspecified street and highway as the place of occurrence of the external cause: Secondary | ICD-10-CM | POA: Diagnosis not present

## 2016-09-09 DIAGNOSIS — M549 Dorsalgia, unspecified: Secondary | ICD-10-CM | POA: Diagnosis not present

## 2016-09-09 DIAGNOSIS — Y939 Activity, unspecified: Secondary | ICD-10-CM | POA: Diagnosis not present

## 2016-09-09 DIAGNOSIS — S199XXA Unspecified injury of neck, initial encounter: Secondary | ICD-10-CM | POA: Diagnosis present

## 2016-09-09 MED ORDER — OXYCODONE-ACETAMINOPHEN 5-325 MG PO TABS
2.0000 | ORAL_TABLET | Freq: Once | ORAL | Status: AC
  Administered 2016-09-09: 2 via ORAL
  Filled 2016-09-09: qty 2

## 2016-09-09 MED ORDER — IBUPROFEN 600 MG PO TABS: 600.0000 mg | ORAL_TABLET | Freq: Three times a day (TID) | ORAL | 0 refills | Status: DC | PRN

## 2016-09-09 MED ORDER — IBUPROFEN 600 MG PO TABS
600.0000 mg | ORAL_TABLET | Freq: Once | ORAL | Status: AC
  Administered 2016-09-09: 600 mg via ORAL
  Filled 2016-09-09: qty 1

## 2016-09-09 MED ORDER — DIAZEPAM 5 MG PO TABS: 5.0000 mg | ORAL_TABLET | Freq: Three times a day (TID) | ORAL | 0 refills | Status: DC | PRN
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
## 2016-09-09 NOTE — ED Notes (Signed)

## 2016-10-02 ENCOUNTER — Other Ambulatory Visit: Payer: Self-pay

## 2016-10-02 ENCOUNTER — Ambulatory Visit: Payer: Self-pay

## 2016-10-02 ENCOUNTER — Ambulatory Visit: Payer: Self-pay | Admitting: Hematology and Oncology

## 2016-10-03 ENCOUNTER — Encounter: Payer: Self-pay | Admitting: Psychiatry

## 2016-10-03 ENCOUNTER — Ambulatory Visit (INDEPENDENT_AMBULATORY_CARE_PROVIDER_SITE_OTHER): Payer: Medicare HMO | Admitting: Psychiatry

## 2016-10-03 ENCOUNTER — Other Ambulatory Visit: Payer: Self-pay | Admitting: Psychiatry

## 2016-10-03 VITALS — BP 126/84 | HR 118 | Temp 98.3°F | Wt 304.4 lb

## 2016-10-03 DIAGNOSIS — F411 Generalized anxiety disorder: Secondary | ICD-10-CM

## 2016-10-03 DIAGNOSIS — F332 Major depressive disorder, recurrent severe without psychotic features: Secondary | ICD-10-CM | POA: Diagnosis not present

## 2016-10-03 DIAGNOSIS — F3170 Bipolar disorder, currently in remission, most recent episode unspecified: Secondary | ICD-10-CM

## 2016-10-03 DIAGNOSIS — R69 Illness, unspecified: Secondary | ICD-10-CM | POA: Diagnosis not present

## 2016-10-03 MED ORDER — VENLAFAXINE HCL ER 150 MG PO CP24: 150.0000 mg | ORAL_CAPSULE | Freq: Every day | ORAL | 2 refills | Status: DC

## 2016-10-03 MED ORDER — ARIPIPRAZOLE 2 MG PO TABS: 2.0000 mg | ORAL_TABLET | Freq: Every day | ORAL | 2 refills | Status: DC

## 2016-10-03 NOTE — Progress Notes (Signed)
Patient ID: Jamie Bennett, female   DOB: 09-10-1960, 56 y.o.   MRN: [id number] [facility] MD/PA/NP OP Progress Note  10/03/2016 10:44 AM Jamie Bennett  MRN:  [id number]  Subjective:  Patient returns for follow-up of her generalized anxiety disorder and  bipolar disorder. Patient is accompanied by her husband. She again reports the same symptom of having anxiety in the evenings. Her husband gives her medications at the appropriate times. He states that if he leaves her even for a few minutes she gets out of control. Apparently the patient has had problems with not being able to regulate her mood and attempting suicide when husband has been away from her. She is seeing [facility] for therapy. We discussed that patient is competent mentally and she could probably be given some responsibility to take care of herself. However husband reports that that his been told to hold onto all the medications previously by all the doctors when she has been hospitalized. We discussed decreasing her Effexor to 150 mg and starting her on Abilify at 2 mg to help with some of the anxiety. Both patient and her husband are unable to say if she has been on Abilify before and if it has been helpful or not.   Chief Complaint: anxiety in the evening Chief Complaint    Follow-up; Medication Refill     Visit Diagnosis:     ICD-9-CM ICD-10-CM   1. GAD (generalized anxiety disorder) 300.02 F41.1   2. Major depressive disorder, recurrent, severe without psychotic features ([place]) 296.33 F33.2   3. Bipolar disorder in full remission, most recent episode unspecified type ([place]) 296.80 F31.70     Past Medical History:  Past Medical History:  Diagnosis Date  . Anemia   . Anxiety   . Bipolar 1 disorder ([place])   . Collagen vascular disease ([place])    rhematoid arthritis  . Constipation   . Fibromyalgia   . Hypotension   . Osteoarthritis   . Osteoporosis   . Rheumatoid arthritis ([place])   . Thyroid disease     Past Surgical  History:  Procedure Laterality Date  . CHOLECYSTECTOMY    . GASTRIC BYPASS     Family History:  Family History  Problem Relation Age of Onset  . Depression Mother   . Dementia Mother   . Heart disease Mother   . Parkinson's disease Father    Social History:  Social History   Social History  . Marital status: Married    Spouse name: N/A  . Number of children: N/A  . Years of education: N/A   Social History Main Topics  . Smoking status: Never Smoker  . Smokeless tobacco: Never Used  . Alcohol use No  . Drug use: No  . Sexual activity: Yes   Other Topics Concern  . None   Social History Narrative  . None   Additional History:   Assessment:   Musculoskeletal: Strength & Muscle Tone: within normal limits Gait & Station: normal Patient leans: N/A  Psychiatric Specialty Exam: Medication Refill     Review of Systems  Psychiatric/Behavioral: Negative for depression, hallucinations, memory loss, substance abuse and suicidal ideas. The patient is nervous/anxious. The patient does not have insomnia.   All other systems reviewed and are negative.   Blood pressure 126/84, pulse (!) 118, temperature 98.3 F (36.8 C), temperature source Oral, weight (!) 304 lb 6.4 oz (138.1 kg).Body mass index is 49.13 kg/m.  General Appearance: Fairly Groomed  Eye Contact:  Good  Speech:  Normal Rate  Volume:  Normal  Mood:  Anxious   Affect: Flat   Thought Process:  Circumstantial  Orientation:  Full (Time, Place, and Person)  Thought Content:  Negative  Suicidal Thoughts:  No  Homicidal Thoughts:  No  Memory:  Immediate;   Good Recent;   Good Remote;   Good  Judgement:  Good  Insight:  Fair  Psychomotor Activity:  Negative  Concentration:  Good  Recall:  Good  Fund of Knowledge: Good  Language: Good  Akathisia:  Negative  Handed:   AIMS (if indicated):    Assets:  Desire for Improvement Social Support  ADL's:  Intact  Cognition: WNL  Sleep:  Not good recently     Is the patient at risk to self?  No. Has the patient been a risk to self in the past 6 months?  No. Has the patient been a risk to self within the distant past?  Yes.   Is the patient a risk to others?  No. Has the patient been a risk to others in the past 6 months?  No. Has the patient been a risk to others within the distant past?  No.  Current Medications: Current Outpatient Prescriptions  Medication Sig Dispense Refill  . Cholecalciferol (HM VITAMIN D3) 4000 units CAPS Take 4,000 capsules by mouth daily.    . cyanocobalamin (,VITAMIN B-12,) 1000 MCG/ML injection Inject 1,000 mcg into the muscle every 30 (thirty) days.    . diazepam (VALIUM) 5 MG tablet Take 1 tablet (5 mg total) by mouth every 8 (eight) hours as needed for muscle spasms. 20 tablet 0  . esomeprazole (NEXIUM) 40 MG capsule Take 1 capsule (40 mg total) by mouth daily at 12 noon.    . Fiber, Guar Gum, CHEW Chew 10 mg by mouth daily.    . fluticasone (FLONASE) 50 MCG/ACT nasal spray Place 1 spray into both nostrils daily.     . hydroxychloroquine (PLAQUENIL) 200 MG tablet TAKE ONE TABLET BY MOUTH TWICE DAILY MONDAY  THROUGH  FRIDAY 120 tablet 0  . hydrOXYzine (ATARAX/VISTARIL) 10 MG tablet Take 1 tablet (10 mg total) by mouth 3 (three) times daily as needed. 30 tablet 0  . ibuprofen (ADVIL,MOTRIN) 600 MG tablet Take 1 tablet (600 mg total) by mouth every 8 (eight) hours as needed. 30 tablet 0  . lamoTRIgine (LAMICTAL) 150 MG tablet Take 1 tablet (150 mg total) by mouth 2 (two) times daily. 60 tablet 1  . levothyroxine (SYNTHROID, LEVOTHROID) 112 MCG tablet Take 112 mcg by mouth daily.    Marland Kitchen loratadine (CLARITIN) 10 MG tablet 10 mg  prn as needed    . Magnesium 400 MG CAPS Take 400 mg by mouth 2 (two) times daily.     . methocarbamol (ROBAXIN) 500 MG tablet 1 at 7 AM when necessary and 1 at 2 PM PRN 60 tablet 3  . Multiple Vitamin (MULTIVITAMIN WITH MINERALS) TABS tablet Take 1 tablet by mouth daily.    Marland Kitchen  sulfamethoxazole-trimethoprim (BACTRIM DS,SEPTRA DS) 800-160 MG tablet     . tiZANidine (ZANAFLEX) 4 MG tablet     . traZODone (DESYREL) 100 MG tablet Take 2 tablet at night as needed for insomnia 60 tablet 1  . triamcinolone cream (KENALOG) 0.5 %     . venlafaxine (EFFEXOR) 75 MG tablet Take 3 tablets (225 mg total) by mouth every morning. 90 tablet 2  . zoledronic acid (RECLAST) 5 MG/100ML SOLN injection Inject 5 mg into the vein  See admin instructions. Patient takes yearly    . tacrolimus (PROTOPIC) 0.03 % ointment      No current facility-administered medications for this visit.     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 Start Abilify at 2 mg once daily. Side effects of metabolic issues and long-term movement disorders discussed with them. Also discussed that though it is supposed to be weight neutral it could cause some weight gain  Insomnia Continue Trazodone to 200mg  po qhs prn.  Panic symptoms Take Vistaril at 10 mg as needed 1-2 times daily when having panic symptoms  Strongly recommend that patient start seeing a therapist to address her various anxieties and issues with body image. We discussed at length about patient and her husband seeing a marriage therapist to address the issues with husband having to be by the patient's side 24 hours in a day and having to control all aspects of her life.  Return to clinic in  time in 2 months time or call before if necessary      .Kayci [place] 10/03/2016, 10:44 AM

## 2016-10-04 ENCOUNTER — Other Ambulatory Visit: Payer: Self-pay

## 2016-10-04 ENCOUNTER — Telehealth: Payer: Self-pay

## 2016-10-04 MED ORDER — HYDROXYZINE HCL 10 MG PO TABS: 10.0000 mg | ORAL_TABLET | Freq: Three times a day (TID) | ORAL | 0 refills | Status: DC | PRN

## 2016-10-04 NOTE — Telephone Encounter (Signed)
pt called states that you did not refill her vistaril 10mg  .  Is patient to continue taking?

## 2016-10-04 NOTE — Telephone Encounter (Signed)
Did you sign off on the rx

## 2016-10-04 NOTE — Telephone Encounter (Signed)
spoke wtih pharmacy called in rx for abilify

## 2016-10-04 NOTE — Telephone Encounter (Signed)
Should be fine.

## 2016-10-04 NOTE — Telephone Encounter (Signed)
pt and her husband came into office today they states that the ability was a tier 4 and it was going to be over a $100 they [facility] so they asked if rx could be call in to that place

## 2016-10-05 ENCOUNTER — Ambulatory Visit (INDEPENDENT_AMBULATORY_CARE_PROVIDER_SITE_OTHER): Payer: Medicare HMO | Admitting: Licensed Clinical Social Worker

## 2016-10-05 ENCOUNTER — Telehealth: Payer: Self-pay

## 2016-10-05 ENCOUNTER — Encounter: Payer: Self-pay | Admitting: Licensed Clinical Social Worker

## 2016-10-05 DIAGNOSIS — F3170 Bipolar disorder, currently in remission, most recent episode unspecified: Secondary | ICD-10-CM

## 2016-10-05 DIAGNOSIS — F411 Generalized anxiety disorder: Secondary | ICD-10-CM | POA: Diagnosis not present

## 2016-10-05 DIAGNOSIS — F332 Major depressive disorder, recurrent severe without psychotic features: Secondary | ICD-10-CM | POA: Diagnosis not present

## 2016-10-05 DIAGNOSIS — R69 Illness, unspecified: Secondary | ICD-10-CM | POA: Diagnosis not present

## 2016-10-05 NOTE — Telephone Encounter (Signed)
Inform patient that the long-acting Effexor may better help with her anxiety

## 2016-10-05 NOTE — Telephone Encounter (Signed)
per lea pt called states that the wrong effexor was sent in pt states she take the "regular effexor"

## 2016-10-05 NOTE — Telephone Encounter (Signed)
Went to pharmacy

## 2016-10-05 NOTE — Progress Notes (Signed)
   THERAPIST PROGRESS NOTE  Session Time: 10:05 AM to 11 AM  Participation Level: Active  Behavioral Response: CasualAlertEuthymic  Type of Therapy: Individual Therapy  Treatment Goals addressed:  increase self-esteem, decreased mood, and work on more independence  Interventions: Solution Focused, Strength-based, Supportive and Other: Healthy relationship skills, skills to build self-esteem  Summary: Jamie Bennett is a 56 y.o. female who presents with still strugging with being kept on a short leash. Described an argument in the last two weeks where she spoke up about him being controlling. She described that he is critical, that he takes things out on her, and has to justify that he is right. Discussed self-esteem and patient had a list of qualities that she can appreciate about herself. She discussed a period of her life where she felt her life was in her hands and therapist discussed ways that she could do that currently. He relates that she has nothing to look for to now. Patient related that he needs more freedom and she has to show her husband that he can trust her and not sure how. Now she can't go out of the house without him coming with her. Discussed ways she is practicing mindfulness including coloring and feels she is doing this and watching the planet earth. Discussed how these activities help her to live in appreciate the present moment. Discussed a couple weeks ago where she did go by alcohol when he was an appointment and drank a bottle of wine in the afternoon. She does not feel alcohol is an issue but that she realizes she has to find a more constructive ways to cope. She feels her behavior was in reaction to feeling controlled. She shared that "things get so overwhelming, he is overbearing and needs an escape". She feels more freedom would help with this. She is getting better at coming back at him, she is speaking up and it feels good. She is finding small ways to be  assertive. Reviewed session and she relates she is getting there standing up to him and getting there feeling good about herself.   Suicidal/Homicidal: No  Therapist Response: Reviewed patient's progress and symptoms. Identified progress patient is making in being assertive and standing up for herself.  Explored what she needs to put in her life to live more fully and identified that for her well-being she needs more freedom in her relationship. Validated patient on her need for more autonomy and that having freedom in one's life is healthy for individuals. Continue to encourage patient on communication skills to speak up for her needs in the relationship. Worked on self-esteem and related that patient coming to appreciate herself more the more she will be able to be assertive and look out for her own needs. Discussed that basis of self-esteem is unconditional self-worth. Explored triggers for drinking and explored more healthy alternative healthy coping skills. Provided strength based and supportive interventions.   Plan: Return again in 4 weeks.Patient review Ted Talk "Healthy Skills for Romantic Relationships" by Lynetta Mare 3. Therapist continue to work with patient on skills to build self-esteem  Diagnosis: Axis I:  major depressive disorder, recurrent, severe, generalized anxiety disorder, bipolar disorder in full remission most recent episode unspecified    Axis II: No diagnosis    Lilac Hoff A, LCSW 10/05/2016

## 2016-10-09 ENCOUNTER — Encounter: Payer: Self-pay | Admitting: Hematology and Oncology

## 2016-10-09 ENCOUNTER — Inpatient Hospital Stay: Payer: Medicare HMO

## 2016-10-09 ENCOUNTER — Inpatient Hospital Stay: Payer: Medicare HMO | Attending: Hematology and Oncology | Admitting: Hematology and Oncology

## 2016-10-09 VITALS — BP 137/79 | HR 80 | Temp 98.0°F | Resp 18 | Wt 306.4 lb

## 2016-10-09 DIAGNOSIS — R634 Abnormal weight loss: Secondary | ICD-10-CM

## 2016-10-09 DIAGNOSIS — D509 Iron deficiency anemia, unspecified: Secondary | ICD-10-CM

## 2016-10-09 DIAGNOSIS — C55 Malignant neoplasm of uterus, part unspecified: Secondary | ICD-10-CM | POA: Diagnosis not present

## 2016-10-09 DIAGNOSIS — E039 Hypothyroidism, unspecified: Secondary | ICD-10-CM

## 2016-10-09 DIAGNOSIS — Z923 Personal history of irradiation: Secondary | ICD-10-CM | POA: Diagnosis not present

## 2016-10-09 DIAGNOSIS — R948 Abnormal results of function studies of other organs and systems: Secondary | ICD-10-CM

## 2016-10-09 DIAGNOSIS — E785 Hyperlipidemia, unspecified: Secondary | ICD-10-CM | POA: Diagnosis not present

## 2016-10-09 DIAGNOSIS — Z029 Encounter for administrative examinations, unspecified: Secondary | ICD-10-CM | POA: Diagnosis not present

## 2016-10-09 DIAGNOSIS — C787 Secondary malignant neoplasm of liver and intrahepatic bile duct: Secondary | ICD-10-CM | POA: Diagnosis not present

## 2016-10-09 DIAGNOSIS — E538 Deficiency of other specified B group vitamins: Secondary | ICD-10-CM | POA: Diagnosis not present

## 2016-10-09 DIAGNOSIS — N2889 Other specified disorders of kidney and ureter: Secondary | ICD-10-CM

## 2016-10-09 DIAGNOSIS — R918 Other nonspecific abnormal finding of lung field: Secondary | ICD-10-CM | POA: Diagnosis not present

## 2016-10-09 DIAGNOSIS — K219 Gastro-esophageal reflux disease without esophagitis: Secondary | ICD-10-CM | POA: Diagnosis not present

## 2016-10-09 DIAGNOSIS — Z85841 Personal history of malignant neoplasm of brain: Secondary | ICD-10-CM

## 2016-10-09 DIAGNOSIS — Z79899 Other long term (current) drug therapy: Secondary | ICD-10-CM

## 2016-10-09 DIAGNOSIS — Z87891 Personal history of nicotine dependence: Secondary | ICD-10-CM

## 2016-10-09 DIAGNOSIS — Z9221 Personal history of antineoplastic chemotherapy: Secondary | ICD-10-CM | POA: Diagnosis not present

## 2016-10-09 LAB — CBC WITH DIFFERENTIAL/PLATELET
Basophils Absolute: 0 10*3/uL (ref 0–0.1)
Basophils Relative: 1 %
Eosinophils Absolute: 0.3 10*3/uL (ref 0–0.7)
Eosinophils Relative: 6 %
HCT: 38.9 % (ref 35.0–47.0)
Hemoglobin: 13.1 g/dL (ref 12.0–16.0)
Lymphocytes Relative: 39 %
Lymphs Abs: 1.8 10*3/uL (ref 1.0–3.6)
MCH: 30.9 pg (ref 26.0–34.0)
MCHC: 33.8 g/dL (ref 32.0–36.0)
MCV: 91.5 fL (ref 80.0–100.0)
Monocytes Absolute: 0.4 10*3/uL (ref 0.2–0.9)
Monocytes Relative: 9 %
Neutro Abs: 2.1 10*3/uL (ref 1.4–6.5)
Neutrophils Relative %: 45 %
Platelets: 243 10*3/uL (ref 150–440)
RBC: 4.25 MIL/uL (ref 3.80–5.20)
RDW: 13.2 % (ref 11.5–14.5)
WBC: 4.6 10*3/uL (ref 3.6–11.0)

## 2016-10-09 LAB — IRON AND TIBC
Iron: 79 ug/dL (ref 28–170)
Saturation Ratios: 18 % (ref 10.4–31.8)
TIBC: 433 ug/dL (ref 250–450)
UIBC: 354 ug/dL

## 2016-10-09 LAB — FERRITIN: Ferritin: 39 ng/mL (ref 11–307)

## 2016-10-09 LAB — FOLATE: Folate: 37 ng/mL (ref >5.9)

## 2016-10-09 MED ORDER — CYANOCOBALAMIN 1000 MCG/ML IJ SOLN
1000.0000 ug | Freq: Once | INTRAMUSCULAR | Status: AC
  Administered 2016-10-09: 1000 ug via INTRAMUSCULAR
  Filled 2016-10-09: qty 1

## 2016-10-09 NOTE — Progress Notes (Signed)
[facility] day:  10/09/16  Chief Complaint: Jamie Bennett is a 56 y.o. female status post gastric bypass surgery with iron deficiency anemia, B12 deficiency, and leukopenia who is seen for 6 month assessment.  HPI: The patient was last seen in the medical oncology clinic on 04/24/2016.  At that time, she had gained weight.  She was short of breath on exertion.  Exam was stable.  Counts were normal.  She received B12 and the influenza vaccine.  She has continued to receive B12 monthly (last 09/04/2016).  She has an appointment for a colonoscopy in 12/2016.  Symptomatically, she reports continuing fatigue and weakness, chronic joint pain worse with wet weather today, shortness of breath with exertion, and chronic bruising.  She has ongoing depression and is currently working with psychiatrist to adjust medications.   Past Medical History:  Diagnosis Date  . Anemia   . Anxiety   . Bipolar 1 disorder ([place])   . Collagen vascular disease ([place])    rhematoid arthritis  . Constipation   . Fibromyalgia   . Hypotension   . Osteoarthritis   . Osteoporosis   . Rheumatoid arthritis ([place])   . Thyroid disease     Past Surgical History:  Procedure Laterality Date  . CHOLECYSTECTOMY    . GASTRIC BYPASS      Family History  Problem Relation Age of Onset  . Depression Mother   . Dementia Mother   . Heart disease Mother   . Parkinson's disease Father     Social History:  reports that she has never smoked. She has never used smokeless tobacco. Her alcohol and drug histories are not on file.  She lives in [place].  The patient is alone today.  Allergies: No Known Allergies  Current Medications: Current Outpatient Prescriptions  Medication Sig Dispense Refill  . Cholecalciferol (HM VITAMIN D3) 4000 units CAPS Take 4,000 capsules by mouth daily.    . cyanocobalamin (,VITAMIN B-12,) 1000 MCG/ML injection Inject 1,000 mcg into the muscle  every 30 (thirty) days.    Marland Kitchen esomeprazole (NEXIUM) 40 MG capsule Take 1 capsule (40 mg total) by mouth daily at 12 noon.    . Fiber, Guar Gum, CHEW Chew 10 mg by mouth daily.    . hydroxychloroquine (PLAQUENIL) 200 MG tablet TAKE ONE TABLET BY MOUTH TWICE DAILY MONDAY  THROUGH  FRIDAY 120 tablet 0  . hydrOXYzine (ATARAX/VISTARIL) 10 MG tablet Take 1 tablet (10 mg total) by mouth 3 (three) times daily as needed. 30 tablet 0  . ibuprofen (ADVIL,MOTRIN) 600 MG tablet Take 1 tablet (600 mg total) by mouth every 8 (eight) hours as needed. 30 tablet 0  . lamoTRIgine (LAMICTAL) 150 MG tablet Take 1 tablet (150 mg total) by mouth 2 (two) times daily. 60 tablet 1  . levothyroxine (SYNTHROID, LEVOTHROID) 112 MCG tablet Take 112 mcg by mouth daily.    . Magnesium 400 MG CAPS Take 400 mg by mouth 2 (two) times daily.     . Multiple Vitamin (MULTIVITAMIN WITH MINERALS) TABS tablet Take 1 tablet by mouth daily.    Marland Kitchen tiZANidine (ZANAFLEX) 4 MG tablet     . traZODone (DESYREL) 100 MG tablet Take 2 tablet at night as needed for insomnia 60 tablet 1  . venlafaxine XR (EFFEXOR-XR) 150 MG 24 hr capsule Take 1 capsule (150 mg total) by mouth daily. 30 capsule 2  . zoledronic acid (RECLAST) 5 MG/100ML SOLN injection Inject 5 mg into  the vein See admin instructions. Patient takes yearly    . ARIPiprazole (ABILIFY) 2 MG tablet Take 1 tablet (2 mg total) by mouth daily. (Patient not taking: Reported on 10/09/2016) 30 tablet 2  . aspirin EC 81 MG tablet Take 81 mg by mouth daily.    . Coenzyme Q-10 200 MG CAPS Take 200 mg by mouth daily.    . diazepam (VALIUM) 5 MG tablet Take 1 tablet (5 mg total) by mouth every 8 (eight) hours as needed for muscle spasms. (Patient not taking: Reported on 10/09/2016) 20 tablet 0  . fluticasone (FLONASE) 50 MCG/ACT nasal spray Place 1 spray into both nostrils daily.     Javier Docker Oil 500 MG CAPS Take 500 mg by mouth daily.    Marland Kitchen loratadine (CLARITIN) 10 MG tablet 10 mg  prn as needed    .  methocarbamol (ROBAXIN) 500 MG tablet 1 at 7 AM when necessary and 1 at 2 PM PRN (Patient not taking: Reported on 10/09/2016) 60 tablet 3  . sulfamethoxazole-trimethoprim (BACTRIM DS,SEPTRA DS) 800-160 MG tablet     . tacrolimus (PROTOPIC) 0.03 % ointment     . triamcinolone cream (KENALOG) 0.5 %      No current facility-administered medications for this visit.     Review of Systems:  GENERAL:  Fatigue.  No fevers or sweats.  Weight up 25 pounds since 04/24/2016. PERFORMANCE STATUS (ECOG):  1 HEENT:  No visual changes, runny nose, sore throat, mouth sores or tenderness. Lungs: Shortness of breath with exertion. No cough.  No hemoptysis. Cardiac:  No chest pain, palpitations, orthopnea, or PND. GI:  No nausea, vomiting, diarrhea, constipation, melena or hematochezia. GU:  No urgency, frequency, dysuria, or hematuria. Musculoskeletal:  Bones and joints hurt all over.  Extremities:  Pain in feet from walking yesterday.  No swelling. Skin:  Bruising on arms.  No rashes or skin changes. Neuro:  Balance issues.  No headache, numbness or weakness, or coordination issues. Endocrine:  Hot flashes twice a week. No diabetes.  Thyroid issues on Synthroid.   Psych:  Emotional.  Anxiety. Pain:  Joint pain. Pain in bilateral feet. Review of systems:  All other systems reviewed and found to be negative.  Physical Exam: Blood pressure 137/79, pulse 80, temperature 98 F (36.7 C), temperature source Tympanic, resp. rate 18, weight (!) 306 lb 7 oz (139 kg). GENERAL:  Well developed, well nourished, heavyset woman sitting comfortably in the exam room in no acute distress.  She has a cane at her side. MENTAL STATUS:  Alert and oriented to person, place and time. HEAD:  Shoulder length brown hair.  Normocephalic, atraumatic, face symmetric, no Cushingoid features. EYES:  Gold rimmed glasses.  Brown eyes.  Pupils equal round and reactive to light and accomodation.  No conjunctivitis or scleral icterus. ENT:   Oropharynx clear without lesion.  Tongue normal. Mucous membranes moist.  RESPIRATORY:  Clear to auscultation without rales, wheezes or rhonchi. CARDIOVASCULAR:  Regular rate and rhythm without murmur, rub or gallop. ABDOMEN:  Fully round.  Soft, non-tender, with active bowel sounds, and no hepatosplenomegaly.  No masses. SKIN:  Slight bruising upper extremities.  No rashes, ulcers or lesions. EXTREMITIES: No edema, no skin discoloration or tenderness.  No palpable cords. LYMPH NODES: No palpable cervical, supraclavicular, axillary or inguinal adenopathy  NEUROLOGICAL: Limited ROM left arm. PSYCH:  Appropriate.  Appointment on 10/09/2016  Component Date Value Ref Range Status  . WBC 10/09/2016 4.6  3.6 - 11.0 K/uL Final  .  RBC 10/09/2016 4.25  3.80 - 5.20 MIL/uL Final  . Hemoglobin 10/09/2016 13.1  12.0 - 16.0 g/dL Final  . HCT 10/09/2016 38.9  35.0 - 47.0 % Final  . MCV 10/09/2016 91.5  80.0 - 100.0 fL Final  . MCH 10/09/2016 30.9  26.0 - 34.0 pg Final  . MCHC 10/09/2016 33.8  32.0 - 36.0 g/dL Final  . RDW 10/09/2016 13.2  11.5 - 14.5 % Final  . Platelets 10/09/2016 243  150 - 440 K/uL Final  . Neutrophils Relative % 10/09/2016 45  % Final  . Neutro Abs 10/09/2016 2.1  1.4 - 6.5 K/uL Final  . Lymphocytes Relative 10/09/2016 39  % Final  . Lymphs Abs 10/09/2016 1.8  1.0 - 3.6 K/uL Final  . Monocytes Relative 10/09/2016 9  % Final  . Monocytes Absolute 10/09/2016 0.4  0.2 - 0.9 K/uL Final  . Eosinophils Relative 10/09/2016 6  % Final  . Eosinophils Absolute 10/09/2016 0.3  0 - 0.7 K/uL Final  . Basophils Relative 10/09/2016 1  % Final  . Basophils Absolute 10/09/2016 0.0  0 - 0.1 K/uL Final  . Ferritin 10/09/2016 39  11 - 307 ng/mL Final  . Folate 10/09/2016 37.0  >5.9 ng/mL Final  . Iron 10/09/2016 79  28 - 170 ug/dL Final  . TIBC 10/09/2016 433  250 - 450 ug/dL Final  . Saturation Ratios 10/09/2016 18  10.4 - 31.8 % Final  . UIBC 10/09/2016 354  ug/dL Final    Assessment:   Jamie Bennett is a 56 y.o. female female with a history of gastric bypass surgery in 2003 and subsequent B12 deficiency and iron deficiency anemia. Last colonoscopy was 7 years ago. Diet is good. She eats rich foods. She Is unable to absorb oral iron.   She received IV iron in 02/2014. She received Feraheme 510 mg on 09/15/2015 and 09/22/2015.    Ferritin has been followed: 38 on 02/05/2015, 160 on 05/14/2015, 14 on 09/13/2015, 373 on 10/13/2015, 217 on 12/21/2015, 102 on 04/24/2016, 73 on 07/10/2016, and 39 on 10/09/2016.  She receives B12 monthly (last 09/04/2016).   Folate was 37.0 on 10/09/2016.  She has an appointment for a colonoscopy in 12/2016  She also has a has a history of rheumatoid arthritis for which she received Humira in the past as well as methotrexate. Humira caused leukopenia. Methotrexate caused liver toxicity. She is currently on Plaquenil.   She was admitted from 11/08/2015 - 11/19/2015 with altered mental status secondary to an overdose of  Provigil.  Course was complicated by metabolic encephalopathy, intubation, acute renal failure requiring hemodialysis, MSSA pneumonia, Klebsiella UTI, and acute hepatic failure.  CBC at discharge included a hematocrit of 27.1, hemoglobin 9.2, MCV 95.1, platelets 103,000, and WBC 8900.    Symptomatically, she has gained weight.  She is short of breath on exertion.  Exam is stable.  Counts are normal.  Plan: 1.  Labs today:  CBC with diff, ferritin, iron studies, folate. 2.  Discuss plan for colonoscopy at [facility] in 12/2016. 3.  B12 today and monthly. 4.  RTC in 3 months for labs (CBC with diff, ferritin) + B12. 5.  RTC in 6 months for MD assessment, labs (CBC with diff, ferritin, iron studies, folate), and B12.  I saw and evaluated the patient, participating in the key portions of the service and reviewing pertinent diagnostic studies and records.  I reviewed the nurse practitioner's note and agree with the findings and the  plan.  The  assessment and plan were discussed with the patient.  A few questions were asked by the patient and answered.    Lucendia Herrlich, NP    Lequita Asal, MD  10/09/2016, 6:14 PM

## 2016-10-09 NOTE — Progress Notes (Signed)
Patient states she is fatigued.  Otherwise, no complaints. 

## 2016-10-10 ENCOUNTER — Other Ambulatory Visit (HOSPITAL_COMMUNITY): Payer: Self-pay | Admitting: Psychiatry

## 2016-10-10 MED ORDER — VENLAFAXINE HCL 75 MG PO TABS: 75.0000 mg | ORAL_TABLET | Freq: Two times a day (BID) | ORAL | 2 refills | Status: DC

## 2016-10-10 NOTE — Telephone Encounter (Signed)
pt states that she had the gastric bypass surgery and that she can not process the xr medication can process the medication correctly.  she needs the regular medicaiton.  [facility]

## 2016-10-10 NOTE — Telephone Encounter (Signed)
Called in the regular medication

## 2016-10-18 ENCOUNTER — Other Ambulatory Visit: Payer: Self-pay | Admitting: Rheumatology

## 2016-10-18 NOTE — Telephone Encounter (Signed)
Last Visit: 07/12/16 Next Visit: 12/12/16 Labs: 06/16/16 WNL PLQ Eye Exam: 02/29/16 WNL  Okay to refill PLQ?

## 2016-10-25 ENCOUNTER — Telehealth: Payer: Self-pay | Admitting: Rheumatology

## 2016-10-25 NOTE — Telephone Encounter (Signed)
Patient called to check the status of authorization for Euflexxa. Please advise.

## 2016-11-01 ENCOUNTER — Other Ambulatory Visit: Payer: Self-pay | Admitting: Psychiatry

## 2016-11-06 ENCOUNTER — Telehealth: Payer: Self-pay

## 2016-11-06 ENCOUNTER — Inpatient Hospital Stay: Payer: Medicare HMO | Attending: Hematology and Oncology

## 2016-11-06 DIAGNOSIS — Z79899 Other long term (current) drug therapy: Secondary | ICD-10-CM | POA: Insufficient documentation

## 2016-11-06 DIAGNOSIS — E538 Deficiency of other specified B group vitamins: Secondary | ICD-10-CM

## 2016-11-06 MED ORDER — CYANOCOBALAMIN 1000 MCG/ML IJ SOLN
1000.0000 ug | Freq: Once | INTRAMUSCULAR | Status: AC
  Administered 2016-11-06: 1000 ug via INTRAMUSCULAR
  Filled 2016-11-06: qty 1

## 2016-11-06 NOTE — Telephone Encounter (Signed)
pt called states she needs refill on lamotrigine.   pt was last seen on  10-03-16 next appt  11-30-16.

## 2016-11-06 NOTE — Telephone Encounter (Signed)
Ok to refill 

## 2016-11-07 ENCOUNTER — Telehealth: Payer: Self-pay

## 2016-11-07 NOTE — Telephone Encounter (Signed)
Called in rx per Dr. Einar Grad order.  Called in and left on doctor's line for lamotrigine #60 with no additional refills.

## 2016-11-07 NOTE — Telephone Encounter (Signed)
received a fax requesting a refill on lamotrigine 150mg  take 1 tablet by mouth twice daily.  pt was last seen on  10-03-16 next appt not until  11-30-16.  Pt will not have enough medication to last until next appt.

## 2016-11-07 NOTE — Telephone Encounter (Signed)
Have already approve this medication

## 2016-11-08 ENCOUNTER — Ambulatory Visit: Payer: Medicare HMO | Admitting: Licensed Clinical Social Worker

## 2016-11-20 DIAGNOSIS — M25561 Pain in right knee: Secondary | ICD-10-CM | POA: Diagnosis not present

## 2016-11-20 DIAGNOSIS — W19XXXA Unspecified fall, initial encounter: Secondary | ICD-10-CM | POA: Diagnosis not present

## 2016-11-20 DIAGNOSIS — R600 Localized edema: Secondary | ICD-10-CM | POA: Diagnosis not present

## 2016-11-26 ENCOUNTER — Other Ambulatory Visit: Payer: Self-pay | Admitting: Psychiatry

## 2016-11-27 ENCOUNTER — Other Ambulatory Visit: Payer: Self-pay | Admitting: Psychiatry

## 2016-11-28 ENCOUNTER — Ambulatory Visit (INDEPENDENT_AMBULATORY_CARE_PROVIDER_SITE_OTHER): Payer: Medicare HMO | Admitting: Rheumatology

## 2016-11-28 ENCOUNTER — Encounter: Payer: Self-pay | Admitting: Rheumatology

## 2016-11-28 VITALS — BP 138/82 | HR 80 | Resp 14 | Ht 66.0 in | Wt 314.0 lb

## 2016-11-28 DIAGNOSIS — G8929 Other chronic pain: Secondary | ICD-10-CM | POA: Diagnosis not present

## 2016-11-28 DIAGNOSIS — M059 Rheumatoid arthritis with rheumatoid factor, unspecified: Secondary | ICD-10-CM

## 2016-11-28 DIAGNOSIS — M797 Fibromyalgia: Secondary | ICD-10-CM | POA: Diagnosis not present

## 2016-11-28 DIAGNOSIS — M170 Bilateral primary osteoarthritis of knee: Secondary | ICD-10-CM

## 2016-11-28 DIAGNOSIS — M25562 Pain in left knee: Secondary | ICD-10-CM | POA: Diagnosis not present

## 2016-11-28 DIAGNOSIS — M25561 Pain in right knee: Secondary | ICD-10-CM

## 2016-11-28 DIAGNOSIS — Z79899 Other long term (current) drug therapy: Secondary | ICD-10-CM

## 2016-11-28 LAB — CBC WITH DIFFERENTIAL/PLATELET
Basophils Absolute: 0 cells/uL (ref 0–200)
Basophils Relative: 0 %
Eosinophils Absolute: 138 cells/uL (ref 15–500)
Eosinophils Relative: 3 %
HCT: 38 % (ref 35.0–45.0)
Hemoglobin: 12.4 g/dL (ref 11.7–15.5)
Lymphocytes Relative: 34 %
Lymphs Abs: 1564 cells/uL (ref 850–3900)
MCH: 30.2 pg (ref 27.0–33.0)
MCHC: 32.6 g/dL (ref 32.0–36.0)
MCV: 92.7 fL (ref 80.0–100.0)
MPV: 9.3 fL (ref 7.5–12.5)
Monocytes Absolute: 460 cells/uL (ref 200–950)
Monocytes Relative: 10 %
Neutro Abs: 2438 cells/uL (ref 1500–7800)
Neutrophils Relative %: 53 %
Platelets: 274 10*3/uL (ref 140–400)
RBC: 4.1 MIL/uL (ref 3.80–5.10)
RDW: 13.4 % (ref 11.0–15.0)
WBC: 4.6 10*3/uL (ref 3.8–10.8)

## 2016-11-28 NOTE — Progress Notes (Signed)
Office Visit Note  Patient: Jamie Bennett             Date of Birth: [birth year]           MRN: [id number]             PCP: Lavera Guise, MD Referring: Lavera Guise, MD Visit Date: 11/28/2016 Occupation: _0 @    Subjective:  Follow-up   History of Present Illness: Jamie Bennett is a 56 y.o. female   Right knee injury > right when pt tripped and fell about 1 week ago. Seen urgent care and xray was normal.  Also,  Injured left shoulder when she fell.  Also, pt wanted to start euflexxa for both knes. Pt states that her insurance company has sent a letter every  Month stating that we are not responding to their request for giving pt information so they can evualate our request and they can approve (or not approve) euflexxa for patient.   Otherwise, her fibromyalgia is acting up. She rates her discomfort as 8-9 a scale of 0-10.   Activities of Daily Living:  Patient reports morning stiffness for 30 minutes.   Patient Reports nocturnal pain.  Difficulty dressing/grooming: Reports Difficulty climbing stairs: Reports Difficulty getting out of chair: Reports Difficulty using hands for taps, buttons, cutlery, and/or writing: Reports   Review of Systems  Constitutional: Positive for fatigue.  HENT: Negative for mouth sores and mouth dryness.   Eyes: Negative for dryness.  Respiratory: Negative for shortness of breath.   Gastrointestinal: Negative for constipation and diarrhea.  Musculoskeletal: Positive for myalgias and myalgias.  Skin: Negative for sensitivity to sunlight.  Psychiatric/Behavioral: Positive for sleep disturbance. Negative for decreased concentration.    PMFS History:  Patient Active Problem List   Diagnosis Date Noted  . Suicide attempt ([place]) 07/10/2016  . Fibromyalgia 07/10/2016  . High risk medication use 07/10/2016  . AKI (acute kidney injury) ([place]) 11/09/2015  . Elevated troponin 11/09/2015  . Hypotension 11/09/2015  .  Respiratory failure ([place])   . Acute hepatic failure 11/08/2015  . Drug overdose 11/08/2015  . Fatty infiltration of liver 02/16/2015  . Hepatic fibrosis 02/16/2015  . Abnormal serum level of alkaline phosphatase 02/15/2015  . Iron deficiency anemia 02/05/2015  . Vitamin B 12 deficiency 02/05/2015  . OP (osteoporosis) 06/16/2014  . Rheumatoid arteritis 03/02/2014  . Osteoarthritis of left hip 03/02/2014  . Hypothyroidism 03/02/2014  . Rheumatic fever without heart involvement 03/02/2014  . Adult hypothyroidism 03/02/2014  . Arthritis of pelvic region, degenerative 03/02/2014  . Bipolar 1 disorder, depressed ([place]) 02/26/2014  . Bariatric surgery status 11/24/2013  . Affective bipolar disorder ([place]) 11/24/2013  . Bipolar affective disorder ([place]) 11/24/2013  . Rheumatoid arthritis ([place]) 11/24/2013  . Polysubstance (excluding opioids) dependence ([place]) 09/11/2013  . Polysubstance dependence ([place]) 09/11/2013  . Combined drug dependence excluding opioids ([place]) 09/11/2013  . Arthritis or polyarthritis, rheumatoid ([place]) 09/05/2013  . Leg weakness 09/05/2013    Past Medical History:  Diagnosis Date  . Anemia   . Anxiety   . Bipolar 1 disorder ([place])   . Collagen vascular disease ([place])    rhematoid arthritis  . Constipation   . Fibromyalgia   . Hypotension   . Osteoarthritis   . Osteoporosis   . Rheumatoid arthritis ([place])   . Thyroid disease     Family History  Problem Relation Age of Onset  . Depression Mother   . Dementia Mother   . Heart disease Mother   .  Parkinson's disease Father    Past Surgical History:  Procedure Laterality Date  . CHOLECYSTECTOMY    . GASTRIC BYPASS     Social History   Social History Narrative  . No narrative on file     Objective: Vital Signs: BP 138/82   Pulse 80   Resp 14   Ht _0  (1.676 m)   Wt (!) 314 lb (142.4 kg)   BMI 50.68 kg/m    Physical Exam  Constitutional: She is oriented to person, place, and time. She appears  well-developed and well-nourished.  HENT:  Head: Normocephalic and atraumatic.  Eyes: EOM are normal. Pupils are equal, round, and reactive to light.  Cardiovascular: Normal rate, regular rhythm and normal heart sounds.  Exam reveals no gallop and no friction rub.   No murmur heard. Pulmonary/Chest: Effort normal and breath sounds normal. She has no wheezes. She has no rales.  Abdominal: Soft. Bowel sounds are normal. She exhibits no distension. There is no tenderness. There is no guarding. No hernia.  Musculoskeletal: Normal range of motion. She exhibits no edema, tenderness or deformity.  Lymphadenopathy:    She has no cervical adenopathy.  Neurological: She is alert and oriented to person, place, and time. Coordination normal.  Skin: Skin is warm and dry. Capillary refill takes less than 2 seconds. No rash noted.  Psychiatric: She has a normal mood and affect. Her behavior is normal.  Nursing note and vitals reviewed.    Musculoskeletal Exam:  Full range of motion of all joints Grip strength is equal and strong bilaterally Fiber myalgia tender points are 18 out of 18 positive  Examination of her right knee shows soft tissue swelling to the medial aspect of her right knee. (Patient states much improved compared to last visit She also has significant bruising to the left shoulder at the lateral deltoid biceps area.  CDAI Exam: CDAI Homunculus Exam:   Joint Counts:  CDAI Tender Joint count: 0 CDAI Swollen Joint count: 0  Global Assessments:  Patient Global Assessment: 8 Provider Global Assessment: 8  CDAI Calculated Score: 16    Investigation: No additional findings.  Appointment on 10/09/2016  Component Date Value Ref Range Status  . WBC 10/09/2016 4.6  3.6 - 11.0 K/uL Final  . RBC 10/09/2016 4.25  3.80 - 5.20 MIL/uL Final  . Hemoglobin 10/09/2016 13.1  12.0 - 16.0 g/dL Final  . HCT 10/09/2016 38.9  35.0 - 47.0 % Final  . MCV 10/09/2016 91.5  80.0 - 100.0 fL Final    . MCH 10/09/2016 30.9  26.0 - 34.0 pg Final  . MCHC 10/09/2016 33.8  32.0 - 36.0 g/dL Final  . RDW 10/09/2016 13.2  11.5 - 14.5 % Final  . Platelets 10/09/2016 243  150 - 440 K/uL Final  . Neutrophils Relative % 10/09/2016 45  % Final  . Neutro Abs 10/09/2016 2.1  1.4 - 6.5 K/uL Final  . Lymphocytes Relative 10/09/2016 39  % Final  . Lymphs Abs 10/09/2016 1.8  1.0 - 3.6 K/uL Final  . Monocytes Relative 10/09/2016 9  % Final  . Monocytes Absolute 10/09/2016 0.4  0.2 - 0.9 K/uL Final  . Eosinophils Relative 10/09/2016 6  % Final  . Eosinophils Absolute 10/09/2016 0.3  0 - 0.7 K/uL Final  . Basophils Relative 10/09/2016 1  % Final  . Basophils Absolute 10/09/2016 0.0  0 - 0.1 K/uL Final  . Ferritin 10/09/2016 39  11 - 307 ng/mL Final  . Folate 10/09/2016 37.0  >  5.9 ng/mL Final  . Iron 10/09/2016 79  28 - 170 ug/dL Final  . TIBC 10/09/2016 433  250 - 450 ug/dL Final  . Saturation Ratios 10/09/2016 18  10.4 - 31.8 % Final  . UIBC 10/09/2016 354  ug/dL Final  Orders Only on 07/10/2016  Component Date Value Ref Range Status  . WBC 07/10/2016 4.2  3.6 - 11.0 K/uL Final  . RBC 07/10/2016 4.32  3.80 - 5.20 MIL/uL Final  . Hemoglobin 07/10/2016 13.5  12.0 - 16.0 g/dL Final  . HCT 07/10/2016 40.2  35.0 - 47.0 % Final  . MCV 07/10/2016 93.0  80.0 - 100.0 fL Final  . MCH 07/10/2016 31.3  26.0 - 34.0 pg Final  . MCHC 07/10/2016 33.6  32.0 - 36.0 g/dL Final  . RDW 07/10/2016 12.8  11.5 - 14.5 % Final  . Platelets 07/10/2016 246  150 - 440 K/uL Final  . Neutrophils Relative % 07/10/2016 46  % Final  . Neutro Abs 07/10/2016 1.9  1.4 - 6.5 K/uL Final  . Lymphocytes Relative 07/10/2016 38  % Final  . Lymphs Abs 07/10/2016 1.6  1.0 - 3.6 K/uL Final  . Monocytes Relative 07/10/2016 10  % Final  . Monocytes Absolute 07/10/2016 0.4  0.2 - 0.9 K/uL Final  . Eosinophils Relative 07/10/2016 6  % Final  . Eosinophils Absolute 07/10/2016 0.2  0 - 0.7 K/uL Final  . Basophils Relative 07/10/2016 0  % Final   . Basophils Absolute 07/10/2016 0.0  0 - 0.1 K/uL Final  . Ferritin 07/10/2016 73  11 - 307 ng/mL Final  Orders Only on 06/29/2016  Component Date Value Ref Range Status  . WBC 06/29/2016 5.5  3.8 - 10.8 K/uL Final  . RBC 06/29/2016 3.89  3.80 - 5.10 MIL/uL Final  . Hemoglobin 06/29/2016 12.0  11.7 - 15.5 g/dL Final  . HCT 06/29/2016 36.6  35.0 - 45.0 % Final  . MCV 06/29/2016 94.1  80.0 - 100.0 fL Final  . MCH 06/29/2016 30.8  27.0 - 33.0 pg Final  . MCHC 06/29/2016 32.8  32.0 - 36.0 g/dL Final  . RDW 06/29/2016 12.3  11.0 - 15.0 % Final  . Platelets 06/29/2016 232  140 - 400 K/uL Final  . MPV 06/29/2016 9.3  7.5 - 12.5 fL Final  . Neutro Abs 06/29/2016 2805  1,500 - 7,800 cells/uL Final  . Lymphs Abs 06/29/2016 1980  850 - 3,900 cells/uL Final  . Monocytes Absolute 06/29/2016 440  200 - 950 cells/uL Final  . Eosinophils Absolute 06/29/2016 275  15 - 500 cells/uL Final  . Basophils Absolute 06/29/2016 0  0 - 200 cells/uL Final  . Neutrophils Relative % 06/29/2016 51  % Final  . Lymphocytes Relative 06/29/2016 36  % Final  . Monocytes Relative 06/29/2016 8  % Final  . Eosinophils Relative 06/29/2016 5  % Final  . Basophils Relative 06/29/2016 0  % Final  . Smear Review 06/29/2016 Criteria for review not met   Final  . Sodium 06/29/2016 139  135 - 146 mmol/L Final  . Potassium 06/29/2016 4.5  3.5 - 5.3 mmol/L Final  . Chloride 06/29/2016 106  98 - 110 mmol/L Final  . CO2 06/29/2016 25  20 - 31 mmol/L Final  . Glucose, Bld 06/29/2016 102* 65 - 99 mg/dL Final  . BUN 06/29/2016 13  7 - 25 mg/dL Final  . Creat 06/29/2016 0.92  0.50 - 1.05 mg/dL Final   Comment:   For patients >  or = 57 years of age: The upper reference limit for Creatinine is approximately 13% higher for people identified as African-American.     . Total Bilirubin 06/29/2016 0.3  0.2 - 1.2 mg/dL Final  . Alkaline Phosphatase 06/29/2016 107  33 - 130 U/L Final  . AST 06/29/2016 14  10 - 35 U/L Final  . ALT  06/29/2016 18  6 - 29 U/L Final  . Total Protein 06/29/2016 5.9* 6.1 - 8.1 g/dL Final  . Albumin 06/29/2016 3.6  3.6 - 5.1 g/dL Final  . Calcium 06/29/2016 9.0  8.6 - 10.4 mg/dL Final     Imaging: No results found.  Speciality Comments: No specialty comments available.    Procedures:  No procedures performed Allergies: Patient has no known allergies.   Assessment / Plan:     Visit Diagnoses: High risk medications (not anticoagulants) long-term use - 11/28/2016 Plaquenil 200 mg twice a day; Plaquenil eye exam normal - Plan: CBC with Differential/Platelet, COMPLETE METABOLIC PANEL WITH GFR  Rheumatoid arthritis with positive rheumatoid factor, involving unspecified site [facility])  Fibromyalgia - A1 2018: Pain is "5" on a scale of 0-10  Primary osteoarthritis of both knees - 11/28/2016: Apply Euflexxa bilateral knees 3  Chronic pain of both knees    #1: Rheumatoid arthritis. No synovitis on examination. Rheumatoid arthritis is well controlled.  #2: High-risk prescription Plaquenil 200 mg; one twice a day; Monday through Friday Patient states that she has enough Plaquenil at home at this time. Patient states her Plaquenil eye exam is normal and up-to-date.   #3: Fibromyalgia syndrome. Active disease with generalized pain and positive tender points. Currently patient rates her discomfort as 5 on a scale of 0-10  #4: Pt needed a sample of diclofenac. We were out of Voltaren gel and Pennsaid. I gave the patient Flector patch Can use 1 patch every 12 hours 3 packs given 2 patches in each pack Lot number: [id number] Expiration date is February 2020  #5: Return to clinic in 5 months. At that time, patient plans to wait 10 pounds less than her proper diet and exercise  #6: Apply for Euflex of bilateral knees 3 History of OA of bilateral knees with chronic bilateral knee pain.  Orders: Orders Placed This Encounter  Procedures  . CBC with Differential/Platelet  . COMPLETE  METABOLIC PANEL WITH GFR   No orders of the defined types were placed in this encounter.   Face-to-face time spent with patient was 30 minutes. 50% of time was spent in counseling and coordination of care.  Follow-Up Instructions: Return in about 5 months (around 04/30/2017) for RA //plq 200 bid m-f // wt loss 10 lbs // oa kj // knee pain.   Eliezer Lofts, PA-C  Note - This record has been created using [employer].  Chart creation errors have been sought, but may not always  have been located. Such creation errors do not reflect on  the standard of medical care.

## 2016-11-29 ENCOUNTER — Telehealth: Payer: Self-pay | Admitting: Rheumatology

## 2016-11-29 LAB — COMPLETE METABOLIC PANEL WITH GFR
ALT: 8 U/L (ref 6–29)
AST: 15 U/L (ref 10–35)
Albumin: 3.9 g/dL (ref 3.6–5.1)
Alkaline Phosphatase: 112 U/L (ref 33–130)
BUN: 14 mg/dL (ref 7–25)
CO2: 22 mmol/L (ref 20–31)
Calcium: 9 mg/dL (ref 8.6–10.4)
Chloride: 107 mmol/L (ref 98–110)
Creat: 1.02 mg/dL (ref 0.50–1.05)
GFR, Est African American: 71 mL/min (ref >=60)
GFR, Est Non African American: 62 mL/min (ref >=60)
Glucose, Bld: 88 mg/dL (ref 65–99)
Potassium: 4.6 mmol/L (ref 3.5–5.3)
Sodium: 140 mmol/L (ref 135–146)
Total Bilirubin: 0.3 mg/dL (ref 0.2–1.2)
Total Protein: 6.6 g/dL (ref 6.1–8.1)

## 2016-11-29 NOTE — Telephone Encounter (Addendum)
Representative from [employer] called due to patient call to start prior auth for Euflexxa. Please call to get prior auth started. [phone]

## 2016-11-30 ENCOUNTER — Telehealth: Payer: Self-pay | Admitting: Rheumatology

## 2016-11-30 ENCOUNTER — Ambulatory Visit: Payer: Medicare HMO | Admitting: Psychiatry

## 2016-11-30 ENCOUNTER — Encounter: Payer: Self-pay | Admitting: Psychiatry

## 2016-11-30 VITALS — BP 156/99 | HR 97 | Temp 98.6°F | Wt 305.4 lb

## 2016-11-30 DIAGNOSIS — F332 Major depressive disorder, recurrent severe without psychotic features: Secondary | ICD-10-CM

## 2016-11-30 DIAGNOSIS — F3170 Bipolar disorder, currently in remission, most recent episode unspecified: Secondary | ICD-10-CM

## 2016-11-30 DIAGNOSIS — F411 Generalized anxiety disorder: Secondary | ICD-10-CM

## 2016-11-30 MED ORDER — TRAZODONE HCL 100 MG PO TABS: ORAL_TABLET | ORAL | 1 refills | Status: DC

## 2016-11-30 MED ORDER — LAMOTRIGINE 150 MG PO TABS: 150.0000 mg | ORAL_TABLET | Freq: Two times a day (BID) | ORAL | 1 refills | Status: DC

## 2016-11-30 MED ORDER — HYDROXYZINE HCL 10 MG PO TABS: 10.0000 mg | ORAL_TABLET | Freq: Three times a day (TID) | ORAL | 1 refills | Status: DC | PRN

## 2016-11-30 MED ORDER — VENLAFAXINE HCL 75 MG PO TABS: 75.0000 mg | ORAL_TABLET | Freq: Two times a day (BID) | ORAL | 2 refills | Status: DC

## 2016-11-30 MED ORDER — ARIPIPRAZOLE 5 MG PO TABS
5.0000 mg | ORAL_TABLET | Freq: Every day | ORAL | 1 refills | Status: DC
Start: 2016-11-30 — End: 2017-01-30

## 2016-11-30 NOTE — Telephone Encounter (Signed)
[employer] calling to get more information on the prior authorization for knee injections. Please call back.

## 2016-11-30 NOTE — Progress Notes (Signed)
Patient ID: Jamie Bennett, female   DOB: May 27, 1961, 56 y.o.   MRN: [id number] [facility] MD/PA/NP OP Progress Note  11/30/2016 11:21 AM Jamie Bennett  MRN:  [id number]  Subjective:  Patient returns for follow-up of her generalized anxiety disorder and  bipolar disorder. Patient is accompanied by her husband. Patient reports that she is doing a little bit better. She has been able to tolerate Abilify well. Okay sleep and appetite. Continues to have mood swings and husband continues to manage her medications. Denies any suicidal thoughts.   Chief Complaint: Anxiety Chief Complaint    Follow-up; Medication Refill     Visit Diagnosis:     ICD-9-CM ICD-10-CM   1. GAD (generalized anxiety disorder) 300.02 F41.1   2. Major depressive disorder, recurrent, severe without psychotic features ([place]) 296.33 F33.2   3. Bipolar disorder in full remission, most recent episode unspecified type ([place]) 296.80 F31.70     Past Medical History:  Past Medical History:  Diagnosis Date  . Anemia   . Anxiety   . Bipolar 1 disorder ([place])   . Collagen vascular disease ([place])    rhematoid arthritis  . Constipation   . Fibromyalgia   . Hypotension   . Osteoarthritis   . Osteoporosis   . Rheumatoid arthritis ([place])   . Thyroid disease     Past Surgical History:  Procedure Laterality Date  . CHOLECYSTECTOMY    . GASTRIC BYPASS     Family History:  Family History  Problem Relation Age of Onset  . Depression Mother   . Dementia Mother   . Heart disease Mother   . Parkinson's disease Father    Social History:  Social History   Social History  . Marital status: Married    Spouse name: N/A  . Number of children: N/A  . Years of education: N/A   Social History Main Topics  . Smoking status: Never Smoker  . Smokeless tobacco: Never Used  . Alcohol use No  . Drug use: No  . Sexual activity: No   Other Topics Concern  . None   Social History Narrative  . None   Additional History:    Assessment:   Musculoskeletal: Strength & Muscle Tone: within normal limits Gait & Station: normal Patient leans: N/A  Psychiatric Specialty Exam: Medication Refill     Review of Systems  Psychiatric/Behavioral: Negative for depression, hallucinations, memory loss, substance abuse and suicidal ideas. The patient is nervous/anxious. The patient does not have insomnia.   All other systems reviewed and are negative.   Blood pressure (!) 156/99, pulse 97, temperature 98.6 F (37 C), temperature source Oral, weight (!) 305 lb 6.4 oz (138.5 kg).Body mass index is 49.29 kg/m.  General Appearance: Fairly Groomed  Eye Contact:  Good  Speech:  Normal Rate  Volume:  Normal  Mood:  Anxious   Affect: Flat   Thought Process:  Circumstantial  Orientation:  Full (Time, Place, and Person)  Thought Content:  Negative  Suicidal Thoughts:  No  Homicidal Thoughts:  No  Memory:  Immediate;   Good Recent;   Good Remote;   Good  Judgement:  Good  Insight:  Fair  Psychomotor Activity:  Negative  Concentration:  Good  Recall:  Good  Fund of Knowledge: Good  Language: Good  Akathisia:  Negative  Handed:   AIMS (if indicated):    Assets:  Desire for Improvement Social Support  ADL's:  Intact  Cognition: WNL  Sleep:  Not good recently  Is the patient at risk to self?  No. Has the patient been a risk to self in the past 6 months?  No. Has the patient been a risk to self within the distant past?  Yes.   Is the patient a risk to others?  No. Has the patient been a risk to others in the past 6 months?  No. Has the patient been a risk to others within the distant past?  No.  Current Medications: Current Outpatient Prescriptions  Medication Sig Dispense Refill  . ARIPiprazole (ABILIFY) 2 MG tablet Take 1 tablet (2 mg total) by mouth daily. 30 tablet 2  . aspirin EC 81 MG tablet Take 81 mg by mouth daily.    . Cholecalciferol (HM VITAMIN D3) 4000 units CAPS Take 4,000 capsules by mouth  daily.    . Coenzyme Q-10 200 MG CAPS Take 200 mg by mouth daily.    . cyanocobalamin (,VITAMIN B-12,) 1000 MCG/ML injection Inject 1,000 mcg into the muscle every 30 (thirty) days.    . diazepam (VALIUM) 5 MG tablet Take 1 tablet (5 mg total) by mouth every 8 (eight) hours as needed for muscle spasms. 20 tablet 0  . esomeprazole (NEXIUM) 40 MG capsule Take 1 capsule (40 mg total) by mouth daily at 12 noon.    . Fiber, Guar Gum, CHEW Chew 10 mg by mouth daily.    . fluticasone (FLONASE) 50 MCG/ACT nasal spray Place 1 spray into both nostrils daily.     . hydroxychloroquine (PLAQUENIL) 200 MG tablet TAKE ONE TABLET BY MOUTH TWICE DAILY ON  MONDAY  THRU  FRIDAY 120 tablet 1  . hydrOXYzine (ATARAX/VISTARIL) 10 MG tablet Take 1 tablet (10 mg total) by mouth 3 (three) times daily as needed. 30 tablet 0  . ibuprofen (ADVIL,MOTRIN) 600 MG tablet Take 1 tablet (600 mg total) by mouth every 8 (eight) hours as needed. 30 tablet 0  . Krill Oil 500 MG CAPS Take 500 mg by mouth daily.    Marland Kitchen lamoTRIgine (LAMICTAL) 150 MG tablet Take 1 tablet (150 mg total) by mouth 2 (two) times daily. 60 tablet 1  . levothyroxine (SYNTHROID, LEVOTHROID) 112 MCG tablet Take 112 mcg by mouth daily.    Marland Kitchen loratadine (CLARITIN) 10 MG tablet 10 mg  prn as needed    . Magnesium 400 MG CAPS Take 400 mg by mouth 2 (two) times daily.     . methocarbamol (ROBAXIN) 500 MG tablet 1 at 7 AM when necessary and 1 at 2 PM PRN 60 tablet 3  . Multiple Vitamin (MULTIVITAMIN WITH MINERALS) TABS tablet Take 1 tablet by mouth daily.    Marland Kitchen tiZANidine (ZANAFLEX) 4 MG tablet Take 4 mg by mouth 2 (two) times daily as needed.     . traZODone (DESYREL) 100 MG tablet Take 2 tablet at night as needed for insomnia 60 tablet 1  . triamcinolone cream (KENALOG) 0.5 %     . venlafaxine (EFFEXOR) 75 MG tablet Take 1 tablet (75 mg total) by mouth 2 (two) times daily. 60 tablet 2  . zoledronic acid (RECLAST) 5 MG/100ML SOLN injection Inject 5 mg into the vein See  admin instructions. Patient takes yearly     No current facility-administered medications for this visit.     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 Increase Abilify to 5 mg once daily. Side effects of metabolic issues and long-term movement disorders discussed with them. Also discussed that though it is supposed to be weight neutral it could cause some weight gain  Insomnia Continue Trazodone to 200mg  po qhs prn.  Panic symptoms Take Vistaril at 10 mg as needed 1-2 times daily when having panic symptoms  Strongly recommend that patient start seeing a therapist to address her various anxieties and issues with body image. We discussed at length about patient and her husband seeing a marriage therapist to address the issues with husband having to be by the patient's side 24 hours in a day and having to control all aspects of her life.  Return to clinic in  time in 2 months time or call before if necessary      .Nettie Wyffels 11/30/2016, 11:21 AM

## 2016-12-04 ENCOUNTER — Inpatient Hospital Stay: Payer: Medicare HMO | Attending: Hematology and Oncology

## 2016-12-04 DIAGNOSIS — Z79899 Other long term (current) drug therapy: Secondary | ICD-10-CM | POA: Insufficient documentation

## 2016-12-04 DIAGNOSIS — E538 Deficiency of other specified B group vitamins: Secondary | ICD-10-CM | POA: Diagnosis not present

## 2016-12-04 MED ORDER — CYANOCOBALAMIN 1000 MCG/ML IJ SOLN
1000.0000 ug | Freq: Once | INTRAMUSCULAR | Status: AC
  Administered 2016-12-04: 1000 ug via INTRAMUSCULAR
  Filled 2016-12-04: qty 1

## 2016-12-06 NOTE — Telephone Encounter (Signed)
In folder, needs PA. 

## 2016-12-07 NOTE — Telephone Encounter (Signed)
IC [employer] and precert request for Euflexxa is pending review by the medical director, since pt has RA as well as OA knees.  IC pt and LMVM advising. They said we should have a decision by end of day tomorrow.  I will f/u next week, as I am off tomorrow.

## 2016-12-11 NOTE — Telephone Encounter (Signed)
April, this will be buy and bill, thanks.

## 2016-12-11 NOTE — Telephone Encounter (Signed)
Can you please call patient and schedule Euflexxa injections bilateral knees?  She is approved per insurance, we need to complete these by 03/01/2017.  Thanks.  [place] and s/w Vicente Males, injections authorized 11/29/16 through 03/01/17, auth # [id number].

## 2016-12-12 ENCOUNTER — Ambulatory Visit: Payer: Medicare HMO | Admitting: Rheumatology

## 2016-12-12 ENCOUNTER — Telehealth (INDEPENDENT_AMBULATORY_CARE_PROVIDER_SITE_OTHER): Payer: Self-pay | Admitting: Radiology

## 2016-12-12 NOTE — Telephone Encounter (Signed)
[employer] called to confirm that we will buy and bill the Euflexxa, SP is cancelled.

## 2016-12-14 DIAGNOSIS — M79672 Pain in left foot: Secondary | ICD-10-CM | POA: Diagnosis not present

## 2016-12-14 DIAGNOSIS — S99922A Unspecified injury of left foot, initial encounter: Secondary | ICD-10-CM | POA: Diagnosis not present

## 2016-12-14 DIAGNOSIS — S93602A Unspecified sprain of left foot, initial encounter: Secondary | ICD-10-CM | POA: Diagnosis not present

## 2016-12-18 ENCOUNTER — Telehealth (INDEPENDENT_AMBULATORY_CARE_PROVIDER_SITE_OTHER): Payer: Self-pay | Admitting: Rheumatology

## 2016-12-22 DIAGNOSIS — J019 Acute sinusitis, unspecified: Secondary | ICD-10-CM | POA: Diagnosis not present

## 2016-12-22 DIAGNOSIS — J209 Acute bronchitis, unspecified: Secondary | ICD-10-CM | POA: Diagnosis not present

## 2016-12-22 DIAGNOSIS — B9689 Other specified bacterial agents as the cause of diseases classified elsewhere: Secondary | ICD-10-CM | POA: Diagnosis not present

## 2016-12-28 ENCOUNTER — Ambulatory Visit: Payer: Medicare HMO | Admitting: Licensed Clinical Social Worker

## 2016-12-28 ENCOUNTER — Ambulatory Visit: Payer: Medicare HMO | Admitting: Behavioral Health

## 2016-12-28 DIAGNOSIS — M791 Myalgia: Secondary | ICD-10-CM | POA: Diagnosis not present

## 2016-12-28 DIAGNOSIS — J209 Acute bronchitis, unspecified: Secondary | ICD-10-CM | POA: Diagnosis not present

## 2016-12-28 DIAGNOSIS — R05 Cough: Secondary | ICD-10-CM | POA: Diagnosis not present

## 2016-12-28 DIAGNOSIS — R062 Wheezing: Secondary | ICD-10-CM | POA: Diagnosis not present

## 2017-01-01 ENCOUNTER — Inpatient Hospital Stay: Payer: Medicare HMO

## 2017-01-01 ENCOUNTER — Inpatient Hospital Stay: Payer: Medicare HMO | Attending: Hematology and Oncology

## 2017-01-01 DIAGNOSIS — E538 Deficiency of other specified B group vitamins: Secondary | ICD-10-CM

## 2017-01-01 DIAGNOSIS — D509 Iron deficiency anemia, unspecified: Secondary | ICD-10-CM

## 2017-01-01 DIAGNOSIS — Z79899 Other long term (current) drug therapy: Secondary | ICD-10-CM | POA: Insufficient documentation

## 2017-01-01 LAB — CBC WITH DIFFERENTIAL/PLATELET
Basophils Absolute: 0 10*3/uL (ref 0–0.1)
Basophils Relative: 0 %
Eosinophils Absolute: 0.2 10*3/uL (ref 0–0.7)
Eosinophils Relative: 3 %
HCT: 37.9 % (ref 35.0–47.0)
Hemoglobin: 12.8 g/dL (ref 12.0–16.0)
Lymphocytes Relative: 38 %
Lymphs Abs: 2.4 10*3/uL (ref 1.0–3.6)
MCH: 30.6 pg (ref 26.0–34.0)
MCHC: 33.9 g/dL (ref 32.0–36.0)
MCV: 90.4 fL (ref 80.0–100.0)
Monocytes Absolute: 0.8 10*3/uL (ref 0.2–0.9)
Monocytes Relative: 12 %
Neutro Abs: 3 10*3/uL (ref 1.4–6.5)
Neutrophils Relative %: 47 %
Platelets: 343 10*3/uL (ref 150–440)
RBC: 4.19 MIL/uL (ref 3.80–5.20)
RDW: 13.8 % (ref 11.5–14.5)
WBC: 6.4 10*3/uL (ref 3.6–11.0)

## 2017-01-01 LAB — FERRITIN: Ferritin: 37 ng/mL (ref 11–307)

## 2017-01-01 MED ORDER — CYANOCOBALAMIN 1000 MCG/ML IJ SOLN
1000.0000 ug | Freq: Once | INTRAMUSCULAR | Status: AC
  Administered 2017-01-01: 1000 ug via INTRAMUSCULAR
  Filled 2017-01-01: qty 1

## 2017-01-08 ENCOUNTER — Ambulatory Visit (INDEPENDENT_AMBULATORY_CARE_PROVIDER_SITE_OTHER): Payer: Medicare HMO | Admitting: Behavioral Health

## 2017-01-08 DIAGNOSIS — F331 Major depressive disorder, recurrent, moderate: Secondary | ICD-10-CM

## 2017-01-08 DIAGNOSIS — F411 Generalized anxiety disorder: Secondary | ICD-10-CM

## 2017-01-08 DIAGNOSIS — R69 Illness, unspecified: Secondary | ICD-10-CM | POA: Diagnosis not present

## 2017-01-08 NOTE — Telephone Encounter (Signed)
Made in error

## 2017-01-09 DIAGNOSIS — M064 Inflammatory polyarthropathy: Secondary | ICD-10-CM | POA: Diagnosis not present

## 2017-01-09 DIAGNOSIS — M25569 Pain in unspecified knee: Secondary | ICD-10-CM | POA: Diagnosis not present

## 2017-01-09 DIAGNOSIS — M791 Myalgia: Secondary | ICD-10-CM | POA: Diagnosis not present

## 2017-01-10 ENCOUNTER — Encounter: Payer: Self-pay | Admitting: Rheumatology

## 2017-01-10 ENCOUNTER — Ambulatory Visit (INDEPENDENT_AMBULATORY_CARE_PROVIDER_SITE_OTHER): Payer: Medicare HMO | Admitting: Rheumatology

## 2017-01-10 DIAGNOSIS — M25562 Pain in left knee: Secondary | ICD-10-CM

## 2017-01-10 DIAGNOSIS — M1712 Unilateral primary osteoarthritis, left knee: Secondary | ICD-10-CM

## 2017-01-10 DIAGNOSIS — M25561 Pain in right knee: Secondary | ICD-10-CM | POA: Diagnosis not present

## 2017-01-10 DIAGNOSIS — M1711 Unilateral primary osteoarthritis, right knee: Secondary | ICD-10-CM

## 2017-01-10 DIAGNOSIS — G8929 Other chronic pain: Secondary | ICD-10-CM

## 2017-01-10 DIAGNOSIS — M170 Bilateral primary osteoarthritis of knee: Secondary | ICD-10-CM

## 2017-01-10 MED ORDER — LIDOCAINE HCL (PF) 1 % IJ SOLN
2.0000 mL | INTRAMUSCULAR | Status: AC | PRN
  Administered 2017-01-10: 2 mL

## 2017-01-10 MED ORDER — SODIUM HYALURONATE (VISCOSUP) 20 MG/2ML IX SOSY
20.0000 mg | PREFILLED_SYRINGE | INTRA_ARTICULAR | Status: AC | PRN
  Administered 2017-01-10: 20 mg via INTRA_ARTICULAR

## 2017-01-10 NOTE — Progress Notes (Signed)
   Procedure Note  Patient: Jamie Bennett             Date of Birth: [birth year]           MRN: [id number]             Visit Date: 01/10/2017  Procedures: Visit Diagnoses: No diagnosis found.  Large Joint Inj Date/Time: 01/10/2017 9:14 AM Performed by: Eliezer Lofts Authorized by: Eliezer Lofts   Consent Given by:  Patient Site marked: the procedure site was marked   Timeout: prior to procedure the correct patient, procedure, and site was verified   Indications:  Pain and joint swelling Location:  Knee Site:  R knee Prep: patient was prepped and draped in usual sterile fashion   Needle Size:  27 G Needle Length:  1.5 inches Approach:  Medial Ultrasound Guidance: No   Fluoroscopic Guidance: No   Arthrogram: No   Medications:  2 mL lidocaine (PF) 1 %; 20 mg Sodium Hyaluronate 20 MG/2ML Aspiration Attempted: Yes   Patient tolerance:  Patient tolerated the procedure well with no immediate complications  Euflexxa No. 1 bilateral knees Large Joint Inj Date/Time: 01/10/2017 9:14 AM Performed by: Eliezer Lofts Authorized by: Eliezer Lofts   Consent Given by:  Patient Site marked: the procedure site was marked   Timeout: prior to procedure the correct patient, procedure, and site was verified   Indications:  Pain and joint swelling Location:  Knee Site:  L knee Prep: patient was prepped and draped in usual sterile fashion   Needle Size:  27 G Needle Length:  1.5 inches Approach:  Medial Ultrasound Guidance: No   Fluoroscopic Guidance: No   Arthrogram: No   Medications:  2 mL lidocaine (PF) 1 %; 20 mg Sodium Hyaluronate 20 MG/2ML Aspiration Attempted: Yes   Patient tolerance:  Patient tolerated the procedure well with no immediate complications  Euflexxa No. 1 bilateral knees

## 2017-01-11 ENCOUNTER — Ambulatory Visit: Payer: Medicare HMO | Admitting: Behavioral Health

## 2017-01-11 ENCOUNTER — Other Ambulatory Visit (HOSPITAL_COMMUNITY): Payer: Self-pay

## 2017-01-11 NOTE — Telephone Encounter (Signed)
pt called on wednesday (I was not in the office wednesday) message on voice mail. pt states that she got a 10 day supply of the hydroxyzine pt states she needs to have a 90 day because of insurance.pt takes 10mg  three times a day and only 30 pills was sent into pharmacy.

## 2017-01-12 NOTE — Telephone Encounter (Signed)
Please call in the 90 day supply.

## 2017-01-13 ENCOUNTER — Encounter: Payer: Self-pay | Admitting: Behavioral Health
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
## 2017-01-16 NOTE — Telephone Encounter (Signed)
left message on doctor line for the hydroxyzine 10mg  #270 with no additional refills. per dr. Einar Grad ok.

## 2017-01-17 NOTE — Telephone Encounter (Signed)
ok 

## 2017-01-19 DIAGNOSIS — M170 Bilateral primary osteoarthritis of knee: Secondary | ICD-10-CM | POA: Insufficient documentation

## 2017-01-22 ENCOUNTER — Ambulatory Visit (INDEPENDENT_AMBULATORY_CARE_PROVIDER_SITE_OTHER): Payer: Medicare HMO | Admitting: Rheumatology

## 2017-01-22 ENCOUNTER — Ambulatory Visit: Payer: Medicare HMO | Admitting: Rheumatology

## 2017-01-22 DIAGNOSIS — M170 Bilateral primary osteoarthritis of knee: Secondary | ICD-10-CM

## 2017-01-22 DIAGNOSIS — M797 Fibromyalgia: Secondary | ICD-10-CM

## 2017-01-22 DIAGNOSIS — Z79899 Other long term (current) drug therapy: Secondary | ICD-10-CM

## 2017-01-22 MED ORDER — LIDOCAINE HCL 2 % IJ SOLN
2.0000 mL | INTRAMUSCULAR | Status: AC | PRN
  Administered 2017-01-22: 2 mL

## 2017-01-22 MED ORDER — SODIUM HYALURONATE (VISCOSUP) 20 MG/2ML IX SOSY
20.0000 mg | PREFILLED_SYRINGE | INTRA_ARTICULAR | Status: AC | PRN
  Administered 2017-01-22: 20 mg via INTRA_ARTICULAR

## 2017-01-22 NOTE — Progress Notes (Signed)
   Procedure Note  Patient: Jamie Bennett             Date of Birth: 1960-10-01           MRN: [id number]             Visit Date: 01/22/2017  Procedures: Visit Diagnoses: Primary osteoarthritis of both knees   Patient is here to receive her second Euflexxa injection to bilateral knee joints.  Large Joint Inj Date/Time: 01/22/2017 9:54 AM Performed by: Bo Merino Authorized by: Bo Merino   Consent Given by:  Patient Site marked: the procedure site was marked   Timeout: prior to procedure the correct patient, procedure, and site was verified   Indications:  Pain and joint swelling Location:  Knee Site:  R knee Prep: patient was prepped and draped in usual sterile fashion   Needle Size:  27 G Needle Length:  1.5 inches Approach:  Medial Ultrasound Guidance: No   Fluoroscopic Guidance: No   Arthrogram: No   Medications:  2 mL lidocaine 2 %; 20 mg Sodium Hyaluronate 20 MG/2ML Aspiration Attempted: Yes   Aspirate amount (mL):  0 Patient tolerance:  Patient tolerated the procedure well with no immediate complications Large Joint Inj Date/Time: 01/22/2017 9:56 AM Performed by: Bo Merino Authorized by: Bo Merino   Consent Given by:  Patient Site marked: the procedure site was marked   Timeout: prior to procedure the correct patient, procedure, and site was verified   Indications:  Pain and joint swelling Location:  Knee Site:  L knee Prep: patient was prepped and draped in usual sterile fashion   Needle Size:  27 G Needle Length:  1.5 inches Approach:  Medial Ultrasound Guidance: No   Fluoroscopic Guidance: No   Arthrogram: No   Medications:  2 mL lidocaine 2 %; 20 mg Sodium Hyaluronate 20 MG/2ML Aspiration Attempted: Yes   Aspirate amount (mL):  0 Patient tolerance:  Patient tolerated the procedure well with no immediate complications    Bo Merino, MD, Julious Payer

## 2017-01-23 DIAGNOSIS — R945 Abnormal results of liver function studies: Secondary | ICD-10-CM | POA: Diagnosis not present

## 2017-01-25 ENCOUNTER — Other Ambulatory Visit: Payer: Self-pay | Admitting: Psychiatry

## 2017-01-26 DIAGNOSIS — M170 Bilateral primary osteoarthritis of knee: Secondary | ICD-10-CM | POA: Diagnosis not present

## 2017-01-26 DIAGNOSIS — M1711 Unilateral primary osteoarthritis, right knee: Secondary | ICD-10-CM | POA: Diagnosis not present

## 2017-01-26 DIAGNOSIS — M1712 Unilateral primary osteoarthritis, left knee: Secondary | ICD-10-CM | POA: Diagnosis not present

## 2017-01-26 NOTE — Progress Notes (Addendum)
   Procedure Note  Patient: Jamie Bennett             Date of Birth: 1960/10/23           MRN: [id number]             Visit Date: 01/29/2017  Procedures:  Visit Diagnoses: Primary osteoarthritis of both knees - Plan: Large Joint Injection/Arthrocentesis, Large Joint Injection/Arthrocentesis  Bilateral knees Euflexxa #3   Large Joint Inj Date/Time: 01/26/2017 11:09 AM Performed by: Bo Merino Authorized by: Bo Merino   Consent Given by:  Patient Site marked: the procedure site was marked   Timeout: prior to procedure the correct patient, procedure, and site was verified   Indications:  Pain and joint swelling Location:  Knee Site:  R knee Prep: patient was prepped and draped in usual sterile fashion   Needle Size:  27 G Needle Length:  1.5 inches Approach:  Medial Ultrasound Guidance: No   Fluoroscopic Guidance: No   Arthrogram: No   Medications:  1.5 mL lidocaine 2 %; 20 mg Sodium Hyaluronate 20 MG/2ML Aspiration Attempted: Yes   Aspirate amount (mL):  0 Patient tolerance:  Patient tolerated the procedure well with no immediate complications Large Joint Inj Date/Time: 01/26/2017 11:09 AM Performed by: Bo Merino Authorized by: Bo Merino   Consent Given by:  Patient Site marked: the procedure site was marked   Timeout: prior to procedure the correct patient, procedure, and site was verified   Indications:  Pain and joint swelling Location:  Knee Site:  L knee Prep: patient was prepped and draped in usual sterile fashion   Needle Size:  27 G Needle Length:  1.5 inches Approach:  Medial Ultrasound Guidance: No   Fluoroscopic Guidance: No   Arthrogram: No   Medications:  1.5 mL lidocaine 2 %; 20 mg Sodium Hyaluronate 20 MG/2ML Aspiration Attempted: Yes   Aspirate amount (mL):  0 Patient tolerance:  Patient tolerated the procedure well with no immediate complications  Patient is complaining of left hip pain. She states she had pelvic  fracture in the past and she is concerned that it may be a similar situation. I've advised her to schedule an appointment with orthopedics. Bo Merino, MD

## 2017-01-29 ENCOUNTER — Other Ambulatory Visit: Payer: Self-pay | Admitting: Psychiatry

## 2017-01-29 ENCOUNTER — Inpatient Hospital Stay: Payer: Medicare HMO | Attending: Hematology and Oncology

## 2017-01-29 ENCOUNTER — Ambulatory Visit (INDEPENDENT_AMBULATORY_CARE_PROVIDER_SITE_OTHER): Payer: Medicare HMO | Admitting: Rheumatology

## 2017-01-29 DIAGNOSIS — E538 Deficiency of other specified B group vitamins: Secondary | ICD-10-CM | POA: Diagnosis present

## 2017-01-29 DIAGNOSIS — M1712 Unilateral primary osteoarthritis, left knee: Secondary | ICD-10-CM | POA: Diagnosis not present

## 2017-01-29 DIAGNOSIS — Z79899 Other long term (current) drug therapy: Secondary | ICD-10-CM | POA: Diagnosis not present

## 2017-01-29 DIAGNOSIS — M1711 Unilateral primary osteoarthritis, right knee: Secondary | ICD-10-CM | POA: Diagnosis not present

## 2017-01-29 DIAGNOSIS — M170 Bilateral primary osteoarthritis of knee: Secondary | ICD-10-CM | POA: Diagnosis not present

## 2017-01-29 MED ORDER — LIDOCAINE HCL 2 % IJ SOLN
1.5000 mL | INTRAMUSCULAR | Status: AC | PRN
  Administered 2017-01-26: 1.5 mL

## 2017-01-29 MED ORDER — SODIUM HYALURONATE (VISCOSUP) 20 MG/2ML IX SOSY
20.0000 mg | PREFILLED_SYRINGE | INTRA_ARTICULAR | Status: AC | PRN
Start: 2017-01-26 — End: 2017-01-26
  Administered 2017-01-26: 20 mg via INTRA_ARTICULAR

## 2017-01-29 MED ORDER — CYANOCOBALAMIN 1000 MCG/ML IJ SOLN
1000.0000 ug | Freq: Once | INTRAMUSCULAR | Status: AC
  Administered 2017-01-29: 1000 ug via INTRAMUSCULAR

## 2017-01-29 MED ORDER — LIDOCAINE HCL 2 % IJ SOLN
1.5000 mL | INTRAMUSCULAR | Status: AC | PRN
Start: 2017-01-26 — End: 2017-01-26
  Administered 2017-01-26: 1.5 mL

## 2017-01-29 MED ORDER — SODIUM HYALURONATE (VISCOSUP) 20 MG/2ML IX SOSY
20.0000 mg | PREFILLED_SYRINGE | INTRA_ARTICULAR | Status: AC | PRN
  Administered 2017-01-26: 20 mg via INTRA_ARTICULAR

## 2017-01-30 ENCOUNTER — Other Ambulatory Visit (HOSPITAL_COMMUNITY): Payer: Self-pay | Admitting: Psychiatry

## 2017-01-30 ENCOUNTER — Telehealth: Payer: Self-pay

## 2017-01-30 MED ORDER — ARIPIPRAZOLE 5 MG PO TABS: 5.0000 mg | ORAL_TABLET | Freq: Every day | ORAL | 1 refills | Status: DC

## 2017-01-30 MED ORDER — TRAZODONE HCL 100 MG PO TABS: ORAL_TABLET | ORAL | 1 refills | Status: DC

## 2017-01-30 NOTE — Telephone Encounter (Signed)
pt called again to check on her refill of trazodone and abilify. pt states she completely out today.

## 2017-01-30 NOTE — Telephone Encounter (Signed)
Sent to pharmacy 

## 2017-01-30 NOTE — Telephone Encounter (Signed)
Ok to call in trazodone and abilify at current dosage until next appointment

## 2017-01-30 NOTE — Telephone Encounter (Signed)
PT CALLED STATES SHE NEEDS REFILL ON TRAZODONE AND THE ABILIFY. PT WAS LAST SEEN ON  12-04-16 NEXT APPT IS 02-14-17 NEEDS ENOUGH TO GET TO NEXT APPT.     Disp Refills Start End   traZODone (DESYREL) 100 MG tablet 60 tablet 1 11/30/2016    Sig: Take 2 tablet at night as needed for insomnia   E-Prescribing Status: Receipt confirmed by pharmacy (11/30/2016 11:25 AM EDT)     Disp Refills Start End   ARIPiprazole (ABILIFY) 5 MG tablet 30 tablet 1 11/30/2016 11/30/2017   Sig - Route: Take 1 tablet (5 mg total) by mouth daily. - Oral   E-Prescribing Status: Receipt confirmed by pharmacy (11/30/2016 11:25 AM EDT)

## 2017-02-01 ENCOUNTER — Other Ambulatory Visit: Payer: Self-pay | Admitting: Orthopedic Surgery

## 2017-02-01 DIAGNOSIS — M25551 Pain in right hip: Secondary | ICD-10-CM | POA: Diagnosis not present

## 2017-02-01 DIAGNOSIS — M79651 Pain in right thigh: Secondary | ICD-10-CM | POA: Diagnosis not present

## 2017-02-01 DIAGNOSIS — Z87312 Personal history of (healed) stress fracture: Secondary | ICD-10-CM | POA: Diagnosis not present

## 2017-02-01 DIAGNOSIS — G8929 Other chronic pain: Secondary | ICD-10-CM | POA: Diagnosis not present

## 2017-02-05 ENCOUNTER — Telehealth: Payer: Self-pay

## 2017-02-05 NOTE — Telephone Encounter (Signed)
pt called states she need enough lamotrigine to get to her appt on 02-14-17. pt was last seen on  11-30-16

## 2017-02-05 NOTE — Telephone Encounter (Signed)
pt called and notified rx was called in and message for rx was left on doctor'ss line.

## 2017-02-05 NOTE — Telephone Encounter (Signed)
Ok call in

## 2017-02-05 NOTE — Telephone Encounter (Signed)
left message for refill for lamictal 150mg  on the doctors line voice mail. no additional refills.

## 2017-02-14 ENCOUNTER — Encounter: Payer: Self-pay | Admitting: Psychiatry

## 2017-02-14 ENCOUNTER — Ambulatory Visit (INDEPENDENT_AMBULATORY_CARE_PROVIDER_SITE_OTHER): Payer: Medicare HMO | Admitting: Behavioral Health

## 2017-02-14 ENCOUNTER — Encounter: Payer: Self-pay | Admitting: Behavioral Health

## 2017-02-14 ENCOUNTER — Ambulatory Visit (INDEPENDENT_AMBULATORY_CARE_PROVIDER_SITE_OTHER): Payer: Medicare HMO | Admitting: Psychiatry

## 2017-02-14 DIAGNOSIS — F331 Major depressive disorder, recurrent, moderate: Secondary | ICD-10-CM

## 2017-02-14 DIAGNOSIS — R69 Illness, unspecified: Secondary | ICD-10-CM | POA: Diagnosis not present

## 2017-02-14 DIAGNOSIS — F411 Generalized anxiety disorder: Secondary | ICD-10-CM

## 2017-02-14 MED ORDER — LAMOTRIGINE 150 MG PO TABS: 150.0000 mg | ORAL_TABLET | Freq: Two times a day (BID) | ORAL | 1 refills | Status: DC

## 2017-02-14 MED ORDER — VENLAFAXINE HCL 75 MG PO TABS: 225.0000 mg | ORAL_TABLET | Freq: Every morning | ORAL | 1 refills | Status: DC

## 2017-02-14 MED ORDER — HYDROXYZINE HCL 10 MG PO TABS
10.0000 mg | ORAL_TABLET | Freq: Three times a day (TID) | ORAL | 1 refills | Status: DC | PRN
Start: 2017-02-14 — End: 2017-04-04

## 2017-02-14 NOTE — Patient Instructions (Signed)
Referred to partial hospitalization program at [facility].

## 2017-02-14 NOTE — Progress Notes (Signed)
Patient ID: PALOMA GRANGE, female   DOB: March 10, 1961, 56 y.o.   MRN: [id number] [facility] MD/PA/NP OP Progress Note  02/14/2017 10:16 AM LENAY LOVEJOY  MRN:  [id number]  Subjective:  Patient returns for follow-up of her generalized anxiety disorder and  bipolar disorder. Patient is accompanied by her husband. Patient seen in her therapist's office. Reports that she has not been doing well. Patient is tearful throughout. Patient's husband reports that she took extra Benadryl week ago. Patient states that she has significant anxiety throughout the day and did not want to feel the anxiety and took the Benadryl. She is unable to say what triggers her anxiety. She has recently started seeing other therapists here at her office due to the previous therapist being out of medical leave. Given that patient has not responded well to a different medication changes and continues to decompensate will recommend that she start the partial hospitalization program to help develop coping skills. Her therapist agrees with this plan. We discussed extensively with patient and her husband the partial hospitalization program and how it would help patient function better. We will discontinue the Abilify since it has not been helpful. She has also been reporting increased anxiety. It is not sure if it is from akathisia. We will increase the Effexor to 225 mg. Patient is able to contract for safety and denies any suicidal thoughts today. Husband is comfortable that he can keep her safe at home.    Chief Complaint: Anxiety Chief Complaint    Follow-up; Medication Refill     Visit Diagnosis:   No diagnosis found.  Past Medical History:  Past Medical History:  Diagnosis Date  . Anemia   . Anxiety   . Bipolar 1 disorder ([place])   . Collagen vascular disease ([place])    rhematoid arthritis  . Constipation   . Fibromyalgia   . Hypotension   . Osteoarthritis   . Osteoporosis   . Rheumatoid arthritis ([place])   . Thyroid  disease     Past Surgical History:  Procedure Laterality Date  . CHOLECYSTECTOMY    . GASTRIC BYPASS     Family History:  Family History  Problem Relation Age of Onset  . Depression Mother   . Dementia Mother   . Heart disease Mother   . Parkinson's disease Father    Social History:  Social History   Social History  . Marital status: Married    Spouse name: N/A  . Number of children: N/A  . Years of education: N/A   Social History Main Topics  . Smoking status: Never Smoker  . Smokeless tobacco: Never Used  . Alcohol use No  . Drug use: No  . Sexual activity: No   Other Topics Concern  . None   Social History Narrative  . None   Additional History:   Assessment:   Musculoskeletal: Strength & Muscle Tone: within normal limits Gait & Station: normal Patient leans: N/A  Psychiatric Specialty Exam: Medication Refill     Review of Systems  Psychiatric/Behavioral: Negative for depression, hallucinations, memory loss, substance abuse and suicidal ideas. The patient is nervous/anxious. The patient does not have insomnia.   All other systems reviewed and are negative.   Blood pressure 132/84, pulse 94, temperature 99.1 F (37.3 C), temperature source Oral, weight (!) 313 lb 9.6 oz (142.2 kg).Body mass index is 50.62 kg/m.  General Appearance: Fairly Groomed  Eye Contact:  Good  Speech:  Normal Rate  Volume:  Normal  Mood:  Anxious   Affect: Flat   Thought Process:  Circumstantial  Orientation:  Full (Time, Place, and Person)  Thought Content:  Negative  Suicidal Thoughts:  Denies active thoughts   Homicidal Thoughts:  No  Memory:  Immediate;   Good Recent;   Good Remote;   Good  Judgement:  Good  Insight:  Fair  Psychomotor Activity:  Negative  Concentration:  Good  Recall:  Good  Fund of Knowledge: Good  Language: Good  Akathisia:  Negative  Handed:   AIMS (if indicated):    Assets:  Desire for Improvement Social Support  ADL's:  Intact   Cognition: WNL  Sleep:  Okay    Is the patient at risk to self?  No. Has the patient been a risk to self in the past 6 months?  No. Has the patient been a risk to self within the distant past?  Yes.   Is the patient a risk to others?  No. Has the patient been a risk to others in the past 6 months?  No. Has the patient been a risk to others within the distant past?  No.  Current Medications: Current Outpatient Prescriptions  Medication Sig Dispense Refill  . ARIPiprazole (ABILIFY) 5 MG tablet Take 1 tablet (5 mg total) by mouth daily. 30 tablet 1  . aspirin EC 81 MG tablet Take 81 mg by mouth daily.    . Cholecalciferol (HM VITAMIN D3) 4000 units CAPS Take 4,000 capsules by mouth daily.    . Coenzyme Q-10 200 MG CAPS Take 200 mg by mouth daily.    . cyanocobalamin (,VITAMIN B-12,) 1000 MCG/ML injection Inject 1,000 mcg into the muscle every 30 (thirty) days.    . diazepam (VALIUM) 5 MG tablet Take 1 tablet (5 mg total) by mouth every 8 (eight) hours as needed for muscle spasms. 20 tablet 0  . esomeprazole (NEXIUM) 40 MG capsule Take 1 capsule (40 mg total) by mouth daily at 12 noon.    . Fiber, Guar Gum, CHEW Chew 10 mg by mouth daily.    . fluticasone (FLONASE) 50 MCG/ACT nasal spray Place 1 spray into both nostrils daily.     . hydroxychloroquine (PLAQUENIL) 200 MG tablet TAKE ONE TABLET BY MOUTH TWICE DAILY ON  MONDAY  THRU  FRIDAY 120 tablet 1  . hydrOXYzine (ATARAX/VISTARIL) 10 MG tablet Take 1 tablet (10 mg total) by mouth 3 (three) times daily as needed. 30 tablet 1  . ibuprofen (ADVIL,MOTRIN) 600 MG tablet Take 1 tablet (600 mg total) by mouth every 8 (eight) hours as needed. 30 tablet 0  . Krill Oil 500 MG CAPS Take 500 mg by mouth daily.    Marland Kitchen lamoTRIgine (LAMICTAL) 150 MG tablet Take 1 tablet (150 mg total) by mouth 2 (two) times daily. 60 tablet 1  . levothyroxine (SYNTHROID, LEVOTHROID) 112 MCG tablet Take 112 mcg by mouth daily.    Marland Kitchen loratadine (CLARITIN) 10 MG tablet 10 mg   prn as needed    . Magnesium 400 MG CAPS Take 400 mg by mouth 2 (two) times daily.     . methocarbamol (ROBAXIN) 500 MG tablet 1 at 7 AM when necessary and 1 at 2 PM PRN 60 tablet 3  . Multiple Vitamin (MULTIVITAMIN WITH MINERALS) TABS tablet Take 1 tablet by mouth daily.    Marland Kitchen tiZANidine (ZANAFLEX) 4 MG tablet Take 4 mg by mouth 2 (two) times daily as needed.     . traZODone (DESYREL) 100 MG tablet Take 2  tablet at night as needed for insomnia 60 tablet 1  . triamcinolone cream (KENALOG) 0.5 %     . venlafaxine (EFFEXOR) 75 MG tablet Take 1 tablet (75 mg total) by mouth 2 (two) times daily. 60 tablet 2  . zoledronic acid (RECLAST) 5 MG/100ML SOLN injection Inject 5 mg into the vein See admin instructions. Patient takes yearly     No current facility-administered medications for this visit.     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 Discontinue Abilify   Insomnia Continue Trazodone to 200mg  po qhs prn.  Panic symptoms Take Vistaril at 10 mg as needed 1-2 times daily when having panic symptoms  Recommend that patient start partial hospitalization program and her therapist Clemencia Course will be helping her. Discussed extensively the importance of PHP with husband and patient.  Return to clinic in 1 month's time or call before if needed    .Samauri Kellenberger 02/14/2017, 10:16 AM

## 2017-02-14 NOTE — Progress Notes (Signed)
   THERAPIST PROGRESS NOTE  Session Time: 1000-1100  Participation Level: Minimal  Behavioral Response: GuardedAlertAnxious, Depressed and Hopeless  Type of Therapy: Individual Therapy with family   Treatment Goals addressed: Anxiety and Coping  Interventions: CBT and Strength-based  Summary: Jamie Bennett is a 56 y.o. female who presents with issues surrounding coping and emotion regulation and is accompanied by her husband to this session. Husband has been concerned about her behavior recently and states that she has been hoarding benedryl and taking it without his knowledge. Pt has a history of substance abuse and he has been in control of her medications since she accidentally overdosed on provigil a year ago resulting in a hospitalization. Pt states that she just wanted to be "numb" so she hid the benedryl from him and took it without his knowledge. He found out when she was acting "loopy" one day and she confessd. Pt was tearful and guarded during session and states that there is a lot of resentment between her and her husband right now. He agrees with this but is open to conversation and processed well with her. He states that he is so worried about her overdosing and dying that he feels like he has to be hypervigilant which creates resentment and animosity on his end. They both state that they are just trying to "survive" day to day and are not enjoying each other or their marriage. Pt states that she doesn't love herself so she can't accept that anyone could love her. Pt has had a lot of health issues and has gained a lot of weight which has caused limitations in what she is able to do to get out and enjoy herself. Everything is centered around finding something to "numb" herself with. Pt does not have a specific drug of choice but has some alcohol abuse history as well. Husband was supportive during session and states that he can see that she is in pain but he is so angry with her  behavior it is hard to support her.  Suicidal/Homicidal: Nowithout intent/plan  Therapist Response: Therapist helped pt and husband with communicating their feelings as well as their needs. Husband states that he needs "more love from her" and she states that she needs "validation". Therapist explored what that means to both of them and they were able to verbalize ways each could show this more. Pt has not been improving since she has been in therapy and needs a higher level of care to address the complex issues in her relationships, her need to cope with substances and her feelings of worthlessness and emptiness. Pt has been referred to [facility] outpatient partial hospitalization program and will follow up with them this week.   Plan: refer for partial hospitalization  Diagnosis: Axis I: Generalized Anxiety Disorder and Major Depression, Recurrent severe    Axis II: Cluster B Traits    Jerral Bonito, Counselor [place], LCAS 02/14/2017

## 2017-02-15 ENCOUNTER — Ambulatory Visit
Admission: RE | Admit: 2017-02-15 | Discharge: 2017-02-15 | Disposition: A | Discharge status: Home / Self Care | Payer: Medicare HMO | Source: Ambulatory Visit | Attending: Orthopedic Surgery | Admitting: Orthopedic Surgery

## 2017-02-15 DIAGNOSIS — M8548 Solitary bone cyst, other site: Secondary | ICD-10-CM | POA: Diagnosis not present

## 2017-02-15 DIAGNOSIS — Z87312 Personal history of (healed) stress fracture: Secondary | ICD-10-CM

## 2017-02-15 DIAGNOSIS — M79651 Pain in right thigh: Secondary | ICD-10-CM | POA: Diagnosis not present

## 2017-02-20 ENCOUNTER — Other Ambulatory Visit (HOSPITAL_COMMUNITY): Payer: Medicare HMO

## 2017-02-20 ENCOUNTER — Other Ambulatory Visit (HOSPITAL_COMMUNITY): Payer: Medicare HMO | Attending: Psychiatry | Admitting: Licensed Clinical Social Worker

## 2017-02-20 DIAGNOSIS — R69 Illness, unspecified: Secondary | ICD-10-CM | POA: Diagnosis not present

## 2017-02-20 DIAGNOSIS — F332 Major depressive disorder, recurrent severe without psychotic features: Secondary | ICD-10-CM | POA: Diagnosis not present

## 2017-02-21 ENCOUNTER — Other Ambulatory Visit (HOSPITAL_COMMUNITY): Payer: Medicare HMO | Admitting: Licensed Clinical Social Worker

## 2017-02-21 DIAGNOSIS — F332 Major depressive disorder, recurrent severe without psychotic features: Secondary | ICD-10-CM | POA: Diagnosis not present

## 2017-02-21 DIAGNOSIS — F3162 Bipolar disorder, current episode mixed, moderate: Secondary | ICD-10-CM

## 2017-02-21 DIAGNOSIS — R69 Illness, unspecified: Secondary | ICD-10-CM | POA: Diagnosis not present

## 2017-02-21 NOTE — Psych (Signed)
Behavioral Health Partial Program Assessment Note  Date: 02/21/2017 Name: Jamie Bennett MRN: [id number]  Chief Complaint: depression not responding to medication or therapy as well as anxiety also not responding  Subjective:describes depression and anxiety to the point she has little desire to live but no active suicidal ideation or plan at the moment   HPI:has had depression all her adult life off and on.  Had been diagnosed with bipolar disorder but no clear manic episodes.  The mania in the past was when she was taking up to 600 mg Provigil in the past.  Without that medication she is just depressed with absolutely no energy which was why she was taking the Provigil in the first place.  Depression has all the usual symptoms of sadness, no energy, focus or interest, suicidal thoughts, history of probably 20 overdoses in the past, no pleasure in things she used to enjoy.  Her husband does not trust her with her medications or to drive or to handle the money.  Consequently she feels controlled and believes staying with him might not work.  Anxiety she says is constant and currently the thing that bothers her most as no medication helps.  She has had a drinking problem in the past trying to relieve the depression and anxiety and will drink a bottle of vodka over one day.  This has not happened that often but part of why her husband will not let her drive is his fear she will drink.  She spent time with her parents recently and had no desire to drink, took no anxiety meds, slept without medication and was able to drive herself and them wherever they needed to go.  She has been diagnosed with borderline personality disorder as well.  She has been disabled since 2007 and that is when she believes she has spiraled down into constant depression and anxiety.  Recently the depression has worsened to the point she is afraid she might try to kill herself and that precipitated the referral to [facility].    Patient is a 56 y.o. Caucasian female presents with depression and anxiety.  Patient was enrolled in partial psychiatric program on 02/21/17.  Primary complaints include: agitation, anxiety, chronic pain, concern about health problems, depression worse, difficulty sleeping, feeling depressed, feeling suicidal, poor concentration and relationship difficulties.  Onset of symptoms was gradual with rapidly worsening course since that time. Psychosocial Stressors include the following: health and marital.   I have reviewed the outpatient records and the history as presented by the patient  Complaints of Pain: pain in knees all the time and in all joints on any given day due to arthritis.  Worst pain is in right upper leg currently. Past Psychiatric History:  Ongoing therapy, history of inpatient stays and ongoing medication management.  Wants to try a different psychiatrist and start over with meds.  Happy with current psychiatrist but just wants to try some one fresh  Currently in treatment with Dr Einar Grad and a new therapist.  Substance Abuse History: alcohol Use of Alcohol: none currently but a history of one day binges Use of Caffeine: other not an issue Use of over the counter: not an issue  Past Surgical History:  Procedure Laterality Date  . CHOLECYSTECTOMY    . GASTRIC BYPASS      Past Medical History:  Diagnosis Date  . Anemia   . Anxiety   . Bipolar 1 disorder ([place])   . Collagen vascular disease ([place])    rhematoid  arthritis  . Constipation   . Fibromyalgia   . Hypotension   . Osteoarthritis   . Osteoporosis   . Rheumatoid arthritis ([place])   . Thyroid disease    Outpatient Encounter Prescriptions as of 02/21/2017  Medication Sig Note  . aspirin EC 81 MG tablet Take 81 mg by mouth daily.   . Cholecalciferol (HM VITAMIN D3) 4000 units CAPS Take 4,000 capsules by mouth daily.   . Coenzyme Q-10 200 MG CAPS Take 200 mg by mouth daily.   . cyanocobalamin (,VITAMIN B-12,) 1000  MCG/ML injection Inject 1,000 mcg into the muscle every 30 (thirty) days.   . diazepam (VALIUM) 5 MG tablet Take 1 tablet (5 mg total) by mouth every 8 (eight) hours as needed for muscle spasms.   Marland Kitchen esomeprazole (NEXIUM) 40 MG capsule Take 1 capsule (40 mg total) by mouth daily at 12 noon.   . Fiber, Guar Gum, CHEW Chew 10 mg by mouth daily.   . fluticasone (FLONASE) 50 MCG/ACT nasal spray Place 1 spray into both nostrils daily.  04/24/2016: Received from: [facility]: Place 2 sprays into both nostrils once daily.  . hydroxychloroquine (PLAQUENIL) 200 MG tablet TAKE ONE TABLET BY MOUTH TWICE DAILY ON  MONDAY  THRU  FRIDAY   . hydrOXYzine (ATARAX/VISTARIL) 10 MG tablet Take 1 tablet (10 mg total) by mouth 3 (three) times daily as needed.   Marland Kitchen ibuprofen (ADVIL,MOTRIN) 600 MG tablet Take 1 tablet (600 mg total) by mouth every 8 (eight) hours as needed. 10/09/2016: PRN  . Krill Oil 500 MG CAPS Take 500 mg by mouth daily.   Marland Kitchen lamoTRIgine (LAMICTAL) 150 MG tablet Take 1 tablet (150 mg total) by mouth 2 (two) times daily.   Marland Kitchen levothyroxine (SYNTHROID, LEVOTHROID) 112 MCG tablet Take 112 mcg by mouth daily.   Marland Kitchen loratadine (CLARITIN) 10 MG tablet 10 mg  prn as needed 07/12/2016: Received from: [facility]  . Magnesium 400 MG CAPS Take 400 mg by mouth 2 (two) times daily.    . methocarbamol (ROBAXIN) 500 MG tablet 1 at 7 AM when necessary and 1 at 2 PM PRN   . Multiple Vitamin (MULTIVITAMIN WITH MINERALS) TABS tablet Take 1 tablet by mouth daily.   Marland Kitchen tiZANidine (ZANAFLEX) 4 MG tablet Take 4 mg by mouth 2 (two) times daily as needed.  07/12/2016: Received from: External Pharmacy  . traZODone (DESYREL) 100 MG tablet Take 2 tablet at night as needed for insomnia   . triamcinolone cream (KENALOG) 0.5 %  07/12/2016: Received from: External Pharmacy  . venlafaxine (EFFEXOR) 75 MG tablet Take 3 tablets (225 mg total) by mouth every morning.   . zoledronic acid  (RECLAST) 5 MG/100ML SOLN injection Inject 5 mg into the vein See admin instructions. Patient takes yearly    No facility-administered encounter medications on file as of 02/21/2017.    No Known Allergies  Social History  Substance Use Topics  . Smoking status: Never Smoker  . Smokeless tobacco: Never Used  . Alcohol use No   Functioning Relationships: mother is a good support but lives 6 hours away Education: College       Please specify degree: bachelors degree Other Pertinent History: None Family History  Problem Relation Age of Onset  . Depression Mother   . Dementia Mother   . Heart disease Mother   . Parkinson's disease Father      Review of Systems Constitutional: negative Eyes: negative Ears, nose, mouth, throat,  and face: negative Respiratory: negative Cardiovascular: negative Gastrointestinal: negative Genitourinary: negative Integument/breast: negative Hematologic/lymphatic: negative Musculoskeletal: negative Neurological: negative Behavioral/Psych: depression and anxiety Endocrine: negative Allergic/Immunologic: negative  Objective:  There were no vitals filed for this visit.  Physical Exam: No exam performed today, no exam necessary.  Mental Status Exam: Appearance:  Well groomed Psychomotor::  Walks with a cane Attention span and concentration: Normal Behavior: calm, cooperative and adequate rapport can be established Speech:  normal pitch and normal volume Mood:  depressed and anxious Affect:  normal and mood-congruent Thought Process:  Coherent Thought Content:  Logical Orientation:  person, place and time/date Cognition:  grossly intact Insight:  Fair Judgment:  Intact Estimate of Intelligence: Above average Fund of knowledge: Intact Memory: Recent and remote intact Abnormal movements: None Gait and station: Walks with a cane  Assessment:  Diagnosis:  Diagnosed with bipolar disorder though that seems to fit less well than ongoing  major depression.  Will retain bipolar disorder1, depressed moderate.  Generalized anxiety disorder.  Probable personality disorder  Indications for admission: inpatient care required if not in partial hospital program  Plan: patient enrolled in Partial Hospitalization Program  Treatment options and alternatives reviewed with patient and patient understands the above plan.   Comments:  Continue Venlafaxxine XR 225 mg, trazodone 200 mg, Lamotrigine 150 mg bid and hydroxyzine 10 mg tid prn.  Consider increasing hydroxyzine to 25 mg tid.  doubt medications are the answer at the moment.  She has some decisions to make about her marriage and living with her parents seemed to take most of her symptoms away .    Donnelly Angelica, MD

## 2017-02-21 NOTE — Psych (Signed)
Comprehensive Clinical Assessment (CCA) Note  02/21/2017 Jamie Bennett [id number]  Visit Diagnosis:      ICD-10-CM   1. Major depressive disorder, recurrent, severe without psychotic features ([place]) F33.2       CCA Part One  Part One has been completed on paper by the patient.  (See scanned document in Chart Review)  CCA Part Two A  Intake/Chief Complaint:  CCA Intake With Chief Complaint CCA Part Two Date: 02/20/17 CCA Part Two Time: 66 Chief Complaint/Presenting Problem: Pt states she is struggling to find a reason to live.  Pt states she has lived with depression most of her life, but symptoms have gotten much worse over the past few months.  Pt has a history of suicide attempts and self-harming.  Pt took a half bottle of Benadryl 2 weeks ago and has a history of trying to overdose.  Pt cut at her wrists the night before the CCA and has some distinct scars from cutting. Pt lives with husband who "is controling about everything."  Pt states husband never lets her out of his sight, will not let her drive, controls her medicine, and money.  Pt feels she has a lack of support outside of her husband but feels smotherd by him.  Pt states husband has anxiety issues which she takes on when he starts expressing his anxiety.  Pt states she has no motivation or energy and sits on the couch to watch TV all day.  Pt states she gained 100lbs in the last year due to inactivity and binge eating.  Pt states she is hopeless at this point and is struggling to find a reason to live. Patients Currently Reported Symptoms/Problems: depression, anxiety, SI, self-harming, insomnia, racing thoughts, loss of interest, lack of motivation and energy, hopelessness, worthlessness, poor concentration, marital stress Collateral Involvement: Husband brought patient to appointment but did not sit in on CCA Individual's Strengths: motivation for treatment  Mental Health Symptoms Depression:  Depression: Change in  energy/activity, Difficulty Concentrating, Hopelessness, Increase/decrease in appetite, Sleep (too much or little), Tearfulness, Weight gain/loss, Worthlessness  Mania:     Anxiety:      Psychosis:     Trauma:     Obsessions:     Compulsions:     Inattention:     Hyperactivity/Impulsivity:     Oppositional/Defiant Behaviors:     Borderline Personality:     Other Mood/Personality Symptoms:      Mental Status Exam Appearance and self-care  Stature:  Stature: Average  Weight:  Weight: Overweight  Clothing:  Clothing: Casual  Grooming:  Grooming: Normal  Cosmetic use:  Cosmetic Use: Age appropriate  Posture/gait:  Posture/Gait: Stooped  Motor activity:  Motor Activity: Not Remarkable  Sensorium  Attention:  Attention: Normal  Concentration:  Concentration: Focuses on irrelevancies, Normal (Pt would go on tangents about stories that were not pertinent to CCA)  Orientation:     Recall/memory:  Recall/Memory: Normal  Affect and Mood  Affect:  Affect: Depressed, Tearful, Anxious  Mood:  Mood: Depressed, Anxious  Relating  Eye contact:  Eye Contact: Fleeting  Facial expression:  Facial Expression: Depressed, Sad  Attitude toward examiner:  Attitude Toward Examiner: Cooperative  Thought and Language  Speech flow: Speech Flow: Normal  Thought content:  Thought Content: Appropriate to mood and circumstances  Preoccupation:  Preoccupations: Suicide  Hallucinations:     Organization:     Transport planner of Knowledge:  Fund of Knowledge: Average  Intelligence:  Intelligence: Average  Abstraction:  Abstraction: Normal  Judgement:  Judgement: Normal  Reality Testing:     Insight:  Insight: Flashes of insight  Decision Making:  Decision Making: Impulsive  Social Functioning  Social Maturity:  Social Maturity: Isolates  Social Judgement:  Social Judgement: Normal  Stress  Stressors:  Stressors: Family conflict (dealing with mother-in-law and "controlling" husband)  Coping  Ability:  Coping Ability: Research officer, political party Deficits:     Supports:      Family and Psychosocial History: Family history Marital status: Married Number of Years Married: 38 What types of issues is patient dealing with in the relationship?: Pt states husband is controling Does patient have children?: Yes How many children?: 1 How is patient's relationship with their children?: Pt sees son about 1x every 6 weeks; Pt states son is the a clone of husband  Childhood History:  Childhood History By whom was/is the patient raised?: Both parents Additional childhood history information: Father was in the [employer] and gone 6 months of the year.  Pt states this caused her to have one set of rules with Mom for 6 months and the another set when Dad came home.  They moved around a lot and pt states she never felt like she made true friends due to this. Description of patient's relationship with caregiver when they were a child: Pt was close with both parents Patient's description of current relationship with people who raised him/her: Pt is close with both parents and is happy when she gets to spend time with them at their home.  Pt states she does not get to do this often becuase they live 6 hours away. How were you disciplined when you got in trouble as a child/adolescent?: Mom would take things away; Dad would spank Does patient have siblings?: Yes Number of Siblings: 1 Description of patient's current relationship with siblings: Patient is close with brother but he lives in [place] and deals with his own depression so he is not much support at the moment Did patient suffer any verbal/emotional/physical/sexual abuse as a child?: No Did patient suffer from severe childhood neglect?: No Has patient ever been sexually abused/assaulted/raped as an adolescent or adult?: No Was the patient ever a victim of a crime or a disaster?: No Witnessed domestic violence?: No Has patient been effected by domestic  violence as an adult?: No  CCA Part Two B  Employment/Work Situation: Employment / Work Copywriter, advertising Employment situation: On disability Why is patient on disability: mental health-bipolar and rheumotoid arthritis How long has patient been on disability: almost three years Patient's job has been impacted by current illness: No What is the longest time patient has a held a job?: 12 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?: No  Education: Museum/gallery curator Currently Attending: no Last Grade Completed: 16 Name of [place]: [place] Did Teacher, adult education From [employer]?: Yes Did Physicist, medical?: Yes What Type of College Degree Do you Have?: Education Did You Attend Graduate School?: No Did You Have An Individualized Education Program (IIEP): No Did You Have Any Difficulty At School?: No  Religion: Religion/Spirituality Are You A Religious Person?: No (raised strict Psychologist, forensic)  Leisure/Recreation: Leisure / Recreation Leisure and Hobbies: reading and crafts, long-time volunteered for [employer] Background  Exercise/Diet:  Exercise/Diet Do You Exercise?: No Have You Gained or Lost A Significant Amount of Weight in the Past Six Months?: Yes-Gained (Pt states she has gained 100 lbs in last year) Number of Pounds Gained: 100 Do You Follow a Special Diet?: No Do You Have Any Trouble Sleeping?: Yes Explanation of Sleeping Difficulties: only sleeps 3-5 hours a night  CCA Part Two C  Alcohol/Drug Use: Alcohol / Drug Use Pain Medications: see MAR Prescriptions: see MAR Over the Counter: see MAR History of alcohol / drug use?: Yes Longest period of sobriety (when/how long): 1 year - last drink this time  was 4-18 and it was 2 pints of beer    CCA Part Three  ASAM's:  Six Dimensions of Multidimensional Assessment  Dimension 1:  Acute Intoxication and/or Withdrawal Potential:     Dimension 2:  Biomedical Conditions and Complications:     Dimension 3:  Emotional, Behavioral, or Cognitive Conditions and Complications:     Dimension 4:  Readiness to Change:     Dimension 5:  Relapse, Continued use, or Continued Problem Potential:     Dimension 6:  Recovery/Living Environment:      Substance use Disorder (SUD)    Social Function:  Social Functioning Social Maturity: Isolates Social Judgement: Normal  Stress:  Stress Stressors: Family conflict (dealing with mother-in-law and "controlling" husband) Coping Ability: Exhausted Patient Takes Medications The Way The Doctor Instructed?: Yes Priority Risk: High Risk  Risk Assessment- Self-Harm Potential: Risk Assessment For Self-Harm Potential Thoughts of Self-Harm: Vague current thoughts Method: No plan Availability of Means: No access/NA Additional Information for Self-Harm Potential: Previous Attempts, Acts of Self-harm Additional Comments for Self-Harm Potential: Patient states she has thoughts of suicide often each day because she feels like she has nothing to live for.  Patient denies plan or intent; Pt has overdosed on meds before so husband is in control of meds   Risk Assessment -Dangerous to Others Potential: Risk Assessment For Dangerous to Others Potential Method: No Plan Availability of Means: No access or NA Intent: Vague intent or NA Notification Required: No need or identified person  DSM5 Diagnoses: Patient Active Problem List   Diagnosis Date Noted  . Primary osteoarthritis of both knees 01/19/2017  . Suicide attempt ([place]) 07/10/2016  . Fibromyalgia 07/10/2016  . High risk medication use 07/10/2016  . AKI (acute kidney injury) ([place]) 11/09/2015  . Elevated troponin 11/09/2015  . Hypotension 11/09/2015  .  Respiratory failure ([place])   . Acute hepatic failure 11/08/2015  . Drug overdose 11/08/2015  . Fatty infiltration of liver 02/16/2015  . Hepatic fibrosis 02/16/2015  . Abnormal serum level of alkaline phosphatase 02/15/2015  . Iron deficiency anemia 02/05/2015  . Vitamin B 12 deficiency 02/05/2015  . OP (osteoporosis) 06/16/2014  . Rheumatoid arteritis 03/02/2014  . Osteoarthritis of left hip 03/02/2014  . Hypothyroidism 03/02/2014  . Rheumatic fever without heart involvement 03/02/2014  . Adult hypothyroidism 03/02/2014  . Arthritis of pelvic region, degenerative 03/02/2014  . Bipolar 1 disorder, depressed ([place]) 02/26/2014  . Bariatric surgery status 11/24/2013  . Affective bipolar disorder ([place]) 11/24/2013  . Bipolar affective disorder ([place]) 11/24/2013  . Rheumatoid arthritis ([place]) 11/24/2013  . Polysubstance (excluding opioids) dependence ([place]) 09/11/2013  . Polysubstance dependence ([place]) 09/11/2013  . Combined drug dependence excluding opioids ([place]) 09/11/2013  . Arthritis or polyarthritis, rheumatoid ([place]) 09/05/2013  . Leg weakness 09/05/2013    Patient Centered Plan: Patient is on the following Treatment Plan(s):  Depression  Recommendations for Services/Supports/Treatments: Recommendations  for Services/Supports/Treatments Recommendations For Services/Supports/Treatments: Partial Hospitalization (Pt reports SI daily and recent cutting; Pt recently took a half bottle of Benadryl to try to "numb" herself and was mad it didn't work when she woke up the next morning; Patient lacks support; Patient states she would like to work on Radiographer, therapeutic)  Treatment Plan Summary: OP Treatment Plan Summary: Pt states "I want to find a way to be happy."  Referrals to Alternative Service(s): Referred to Alternative Service(s):   Place:   Date:   Time:    Referred to Alternative Service(s):   Place:   Date:   Time:    Referred to Alternative Service(s):   Place:   Date:   Time:     Referred to Alternative Service(s):   Place:   Date:   Time:     Royetta Crochet, LPCA

## 2017-02-21 NOTE — Psych (Signed)
   [facility] PHP THERAPIST PROGRESS NOTE  LEITHA HYPPOLITE [id number]  Session Time: 9-2  Participation Level: Active  Behavioral Response: CasualAlertDepressed  Type of Therapy: Group Therapy  Treatment Goals addressed: Coping  Interventions: CBT, DBT, Supportive and Reframing  Summary:   Suicidal/Homicidal: Nowithout intent/plan  Therapist Response: RICCA MELGAREJO is a 56 y.o. female who presents with depression and anxiety symptoms. Patient arrived within time allowed and reports she is feeling "extra depressed." Patient rates her mood at a .5 on a 1- 10 scale with 10 being great. Patient reports "scratching my arms" due to anxiety the previous evening.  Pt reports having thoughts of not coming to group, but ultimately understands she needs to do something to change if she wants her life to change.  Pt discussed not having a support system set up, outside of her husband.  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  Pt states she shared a lot in her initial assessment that she has not shared before and it was "freeing." Pt states she drove herself to group which was a huge step within it self.  Patient engaged in activity and discussion. Patient demonstrates some progress as evidenced by being open in group on her first day.  Patient denies SI/HI/self-harm thoughts at end of session.  Plan: Patient will continue in PHP and medication management while working on decreasing depression symptoms and increasing distress tolerance skills.  Diagnosis: Bipolar 1 disorder, mixed, moderate ([facility]) [F31.62]    1. Bipolar 1 disorder, mixed, moderate ([place])     Abygale Karpf J Gorge Almanza, LPCA 02/21/2017

## 2017-02-22 ENCOUNTER — Other Ambulatory Visit (HOSPITAL_COMMUNITY): Payer: Medicare HMO | Admitting: Specialist

## 2017-02-22 ENCOUNTER — Other Ambulatory Visit (HOSPITAL_COMMUNITY): Payer: Medicare HMO | Admitting: Licensed Clinical Social Worker

## 2017-02-22 ENCOUNTER — Encounter (HOSPITAL_COMMUNITY): Payer: Self-pay | Admitting: Specialist

## 2017-02-22 DIAGNOSIS — F332 Major depressive disorder, recurrent severe without psychotic features: Secondary | ICD-10-CM | POA: Diagnosis not present

## 2017-02-22 DIAGNOSIS — R4589 Other symptoms and signs involving emotional state: Secondary | ICD-10-CM

## 2017-02-22 DIAGNOSIS — F3162 Bipolar disorder, current episode mixed, moderate: Secondary | ICD-10-CM

## 2017-02-22 NOTE — Therapy (Signed)
[address] [place], [place], [address] Phone: [phone]   Fax:  [phone]  Occupational Therapy Evaluation  Patient Details  Name: Jamie Bennett MRN: [id number] Date of Birth: 13-Dec-1960 No Data Recorded  Encounter Date: 02/22/2017      OT End of Session - 02/22/17 1505    Visit Number 1   Number of Visits 1   OT Start Time 1030   OT Stop Time 1130   OT Time Calculation (min) 60 min   Activity Tolerance Patient tolerated treatment well   Behavior During Therapy [facility] for tasks assessed/performed      Past Medical History:  Diagnosis Date  . Anemia   . Anxiety   . Bipolar 1 disorder ([place])   . Collagen vascular disease ([place])    rhematoid arthritis  . Constipation   . Fibromyalgia   . Hypotension   . Osteoarthritis   . Osteoporosis   . Rheumatoid arthritis ([place])   . Thyroid disease     Past Surgical History:  Procedure Laterality Date  . CHOLECYSTECTOMY    . GASTRIC BYPASS      There were no vitals filed for this visit.      Subjective Assessment - 02/22/17 1505    Currently in Pain? No/denies           [facility] OT Assessment - 02/22/17 0001      Assessment   Diagnosis Dr. Donnelly Angelica     Precautions   Precautions None     Balance Screen   Has the patient fallen in the past 6 months No   Has the patient had a decrease in activity level because of a fear of falling?  No   Is the patient reluctant to leave their home because of a fear of falling?  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  There is clear connection between behavior and interest/personal goals.  Motivation is intact.  o    o    o    Assessment:  Patient demonstrates autonomy motivation type.  Patient will benefit from occupational therapy intervention in order to improve time management, financial management, stress management, job readiness skills, social skills,sleep hygiene, exercise and healthy eating habits,  and health management skills and other psychosocial skills needed for preparation to return to full time community living and to be a productive community member.   Plan:  Patient will participate in skilled occupational therapy sessions individually or in a group setting to improve coping skills, psychosocial skills, and emotional skills required to return to prior level of function as a productive community member. Treatment will be 1-2 times per week for 2-6 weeks.    S:  I need to go to sleep earlier O:  patient participated in skilled OT group focusing on improving sleep hygiene.  Patient was educated and discussed benefits of getting enough sleep, tips to improve sleep including routines, environment, diet, exercises, calming activities.  Patient was educated on use of sleep diary to identify current sleep patterns and strategies to improve sleep routine.   A:  Patient identified one new sleep hygiene technique to complete at home. P:  continue skilled OT group 2 times per week for 3 weeks.                     OT Education - 03-05-17 1505    Education provided Yes   Education Details educated on sleep hygiene techniques   Person(s) Educated Patient   Methods Explanation;Handout   Comprehension Verbalized understanding          OT Short Term Goals - 03-05-17 1507      OT SHORT TERM GOAL #1   Title Patient will  be educated on strategies to improve psychosocial skills needed to participate fully in all daily, work, and leisure activities.   Time 3   Period Weeks   Status New   Target Date 03/15/17     OT SHORT TERM GOAL #2   Title Patient will be educated on a HEP and independent with implementation of HEP.   Time 3   Period Weeks   Status New     OT SHORT TERM GOAL #3   Title Patient will independently apply psychosocial skills and coping mechanisms to her daily activities in order to function independently.   Time 3   Period Weeks   Status New                  Plan - 05-Mar-2017 1506    Occupational performance deficits (Please refer to evaluation for details): ADL's;IADL's;Rest and Sleep;Education;Work;Leisure;Social Participation   Rehab Potential Good   OT Frequency 2x / week   OT Duration 4 weeks   OT Treatment/Interventions Self-care/ADL training  coping and psychosocial skills    Clinical Decision Making Limited treatment options, no task modification necessary   Consulted and Agree with Plan of Care Patient      Patient will benefit from skilled therapeutic intervention in order to improve the following deficits and impairments:   (decreased coping skills and decreased psychosocial skills )  Visit Diagnosis: Bipolar 1 disorder, mixed, moderate (HCC)  Major depressive disorder, recurrent, severe without psychotic features ([place])  Difficulty coping      G-Codes - 03/05/2017 1508    Functional Assessment Tool Used (Outpatient only) clinical judgeemnt   Functional Limitation Other OT primary   Other OT Primary Current Status (F0277) At least 40 percent but less than 60 percent impaired, limited or restricted   Other OT Primary Goal Status (A1287) At least 1 percent but less than 20 percent impaired, limited or restricted      Problem List Patient Active Problem List   Diagnosis Date Noted  . Primary osteoarthritis of both knees 01/19/2017  . Suicide attempt  ([place]) 07/10/2016  . Fibromyalgia 07/10/2016  . High risk medication use 07/10/2016  . AKI (acute kidney injury) ([place]) 11/09/2015  . Elevated troponin 11/09/2015  . Hypotension 11/09/2015  . Respiratory failure ([place])   . Acute hepatic failure 11/08/2015  . Drug overdose 11/08/2015  . Fatty infiltration of liver 02/16/2015  . Hepatic fibrosis 02/16/2015  . Abnormal serum level of alkaline phosphatase 02/15/2015  . Iron deficiency anemia 02/05/2015  . Vitamin B 12 deficiency 02/05/2015  . OP (osteoporosis) 06/16/2014  . Rheumatoid arteritis 03/02/2014  . Osteoarthritis of left hip 03/02/2014  . Hypothyroidism 03/02/2014  . Rheumatic fever without heart involvement 03/02/2014  .  Adult hypothyroidism 03/02/2014  . Arthritis of pelvic region, degenerative 03/02/2014  . Bipolar 1 disorder, depressed ([place]) 02/26/2014  . Bariatric surgery status 11/24/2013  . Affective bipolar disorder ([place]) 11/24/2013  . Bipolar affective disorder ([place]) 11/24/2013  . Rheumatoid arthritis ([place]) 11/24/2013  . Polysubstance (excluding opioids) dependence ([place]) 09/11/2013  . Polysubstance dependence ([place]) 09/11/2013  . Combined drug dependence excluding opioids ([place]) 09/11/2013  . Arthritis or polyarthritis, rheumatoid ([place]) 09/05/2013  . Leg weakness 09/05/2013    Vangie Bicker, [place], OTR/L [phone]  02/22/2017, 3:09 PM  [facility] PARTIAL HOSPITALIZATION PROGRAM [place], [place], [address] Phone: [phone]   Fax:  [phone]  Name: Jamie Bennett MRN: [id number] Date of Birth: 1960-08-15

## 2017-02-22 NOTE — Progress Notes (Signed)
Spiritual care group 02/21/2017 10:30-12:00  Facilitated by by Simone Curia.   ? Spiritual care group focused on topic of "community," exploring group member's current experience of community, what they value and what they hope for in community. Group engaged in facilitated discussion and then chose pieces of art or pictures that represented their current experience of community and what they long for. Group facilitation drew on Narrative and Adlerian frameworks as well as brief CBT.   Jamie Bennett joined group after meeting with MD.   Was brought into group by facilitator.  Shared experiences of community in [place] reenactment as well as experiences teaching.  She also was formerly a part of a close online community around a musician.  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  Described spouse as closely monitoring her actions and framed this as both understandable, as she has had some history with misusing prescription medication, and also overbearing.  Stated she feels as though she has lost her self with loss of connections outside of her marriage.    Stated that she has felt this needed to change and had been feeling that "this needed to change by me not being here" but now feels "I need to begin to reclaim some of myself again"  Recently found an experience of staying with her parents in [place] for three weeks helpful.    Jamie Bennett experienced support from other group members who encouraged her shift in energy when she was speaking about things she enjoys.      WL / [place], MDiv

## 2017-02-22 NOTE — Psych (Signed)
   [facility] BH PHP THERAPIST PROGRESS NOTE  Jamie Bennett [id number]  Session Time: 9-2  Participation Level: Active  Behavioral Response: CasualAlertDepressed  Type of Therapy: Group Therapy, Occupational Therapy, Psychoeducation, Psychotherapy, Art Therapy  Treatment Goals addressed: Coping  Interventions: CBT, DBT, Supportive and Reframing  Summary: 9:00 - 10:30 Clinician led check-in regarding current stressors and situation, and review of patient completed daily inventory. Clinician utilized active listening and empathetic response and validated patient emotions. Clinician facilitated processing group on pertinent issues.  10:30 -11:30: OT Group  11:30 - 12:00: Clinician led psychoeducation group on time management.  12:00-12:45: Reflection group: Patients encouraged to practice skills and interpersonal techniques or work on mindfulness and relaxation techniques. The importance of self-care and making skills part of a routine to increase usage were stressed.  12:45 - 1:50 Clinician led psychotherapy group on self-esteem. Patients participated in art therapy by creating a self-esteem mantra collage.  1:50 - 2:00 Clinician led check-out. Clinician assessed for immediate needs, medication compliance and efficacy, and safety concerns.   Suicidal/Homicidal: Nowithout intent/plan  Therapist Response: [name] is a 56 y.o. female who presents with depression and anxiety symptoms. Patient arrived within time allowed and reports she is feeling "excited about coming to group" Patient rates her mood at a 5 on a 1- 10 scale with 10 being great. Patient reports she is feeling optimistic about learning new skills from group and working towards becoming happy.  Pt reports it is nice to be "free of husband" while in group since she feels he is controlling.  Pt reports she is enjoying driving herself to and from group, which husband has not let her do in a year.  She was able to roll the  windows down and turn the music up loud on the way and it felt "heavenly".  Pt made a collage with a mantra about continuing to move even when hardships arise.  Pt reported enjoying art therapy and that she will continue to look at the mantra collage for support and inspiration.  Patient engaged in activity and discussion. Patient demonstrates some progress as evidenced by being willing to use the skills outside of group.  Patient denies SI/HI/self-harm thoughts at end of session.  Plan: Patient will continue in PHP and medication management while working on decreasing depression symptoms and increasing distress tolerance skills.  Diagnosis: Bipolar 1 disorder, mixed, moderate (HCC) [F31.62]    1. Bipolar 1 disorder, mixed, moderate ([place])     Stephanee Barcomb J Mackie Goon, LPCA 02/22/2017

## 2017-02-23 ENCOUNTER — Other Ambulatory Visit (HOSPITAL_COMMUNITY): Payer: Medicare HMO | Admitting: Licensed Clinical Social Worker

## 2017-02-23 DIAGNOSIS — F3162 Bipolar disorder, current episode mixed, moderate: Secondary | ICD-10-CM

## 2017-02-23 DIAGNOSIS — F332 Major depressive disorder, recurrent severe without psychotic features: Secondary | ICD-10-CM | POA: Diagnosis not present

## 2017-02-23 NOTE — Psych (Signed)
   [facility] BH PHP THERAPIST PROGRESS NOTE  Jamie Bennett [id number]  Session Time: 9-1  Participation Level: Active  Behavioral Response: CasualAlertDepressed  Type of Therapy: Group Therapy, Psychoeducation, Psychotherapy, Art Therapy; Individual therapy  Treatment Goals addressed: Coping  Interventions: CBT, DBT, Supportive and Reframing  Summary:  9:00 - 9:15: Pt completed daily inventory 9:15 - 10:15: Individual session  10:15 - 10:30 Clinician led check-in regarding current stressors and situation, and review of patient completed daily inventory. Clinician utilized active listening and empathetic response and validated patient emotions. Clinician facilitated processing group on pertinent issues.  10:30 -11:00: Clinician introduced topic of positive psychology and provided psycho-education regarding how this can aid mental health. 11:00 - 12:00: Cln led art therapy group on positivity. Pt's created artistic depictions that will inspire them to stay positive.  12:00 - 12:50 Clinician introduced topic of "Positive Psychology". Group watched "Positive Psychology" Ted-Talk. Patients discussed how their "lense" of life effects the way they feel. Patients identified two strategies they would be willing to try to change their "lense".  12:50 - 1:00 Clinician led check-out. Clinician assessed for immediate needs, medication compliance and efficacy, and safety concerns.   Suicidal/Homicidal: Nowithout intent/plan  Therapist Response: [name] is a 56 y.o. female who presents with depression and anxiety symptoms. Patient arrived within time allowed and reports she is feeling "pretty low" Patient rates her mood at a 1 on a 1- 10 scale with 10 being great. Patient reports having a conflict with her husband and son which increased negative feelings last night. Pt used art therapy to depict a variety of pictorial representations of things that make her feel positive. Patient engaged in  activity and discussion. Patient demonstrates some progress as evidenced by reporting mood at a 4 by end of group.  Patient denies SI/HI/self-harm thoughts at end of session.  Plan: Patient will continue in PHP and medication management while working on decreasing depression symptoms and increasing distress tolerance skills.  Diagnosis: Bipolar 1 disorder, mixed, moderate (HCC) [F31.62]    1. Bipolar 1 disorder, mixed, moderate ([place])     Lorin Glass, LCSW 02/23/2017

## 2017-02-23 NOTE — Psych (Signed)
Session Time: 9:15-10:15 Type of Counseling: Individual  Pt reports she did not sleep much and had a hard time coming to group this morning due to a back evening the previous night.  Pt reports taking 2 muscle relaxers due to her body hurting so much from being tense the whole night.  Cln questioned pt about this and pt clarified that she took 1 muscle relaxer and when that did not help after an hour, she took another and did not do it to hurt herself.  Pt states her husband is in control of her medicine so she feels safe and is unable to hurt herself with her pills.  Pt states she tried to share with her husband what she learned and did in group yesterday and husband said, "That's all you did?"  Pt states this made her feel bad about the progress she felt she made and sent her back into a low mood.  Pt states she told husband she was going to stop by the craft store on the way home from group this afternoon and husband told her she could not because he cannot trust her.  Pt states husband will not let her drive anywhere other than to and from group and tracks her phone and odometer.  Pt reports hearing her son state that he cannot leave her grandson alone with her due to her depression.  Pt hit herself on her arms over and over again after interaction with husband and hearing her son make this comment "because I just wanted the hurt to go away."  Cln discussed other options for pt to use to make the hurt go away, such as distraction like focusing on crafts or reading a book.  Cln and pt discussed how her focusing on helping herself may help change her son's thinking in the future.  Pt states she made "my kind of tea" (meaning stronger than what her husband likes) in the middle of the night so he didn't know she did it.  Pt reports she often feels she needs to "sneak" to do things for herself because her husband makes mean comments, judges her, or tells her she can't do something.  Pt feels a lack of control over  her life which contributes to her depression.  Cln and pt discussed ways she can start taking control of her life.  Pt made a goal of talking with her husband over the weekend and telling him she will be going to see her parents in [place] instead of asking.  Cln and pt discussed writing down her talking points for discussion, such as specific dates for going and coming back, why it is important to her, and how it can help her.  Cln offered to roleplay with pt, but pt states this has not helped her in the past and she did not want to try it.  Pt states she has no current SI/HI/self-harm thoughts.  Cln will check in with pt on Monday about goal.  Loistine Chance, LPCA 02/23/17

## 2017-02-26 ENCOUNTER — Other Ambulatory Visit (HOSPITAL_COMMUNITY): Payer: Medicare HMO | Admitting: Licensed Clinical Social Worker

## 2017-02-26 ENCOUNTER — Inpatient Hospital Stay: Payer: Medicare HMO

## 2017-02-26 ENCOUNTER — Other Ambulatory Visit: Payer: Self-pay | Admitting: Hematology and Oncology

## 2017-02-26 DIAGNOSIS — F314 Bipolar disorder, current episode depressed, severe, without psychotic features: Secondary | ICD-10-CM

## 2017-02-26 DIAGNOSIS — E538 Deficiency of other specified B group vitamins: Secondary | ICD-10-CM

## 2017-02-26 DIAGNOSIS — F332 Major depressive disorder, recurrent severe without psychotic features: Secondary | ICD-10-CM | POA: Diagnosis not present

## 2017-02-26 NOTE — Psych (Signed)
   [facility] BH PHP THERAPIST PROGRESS NOTE  Jamie Bennett [id number]  Session Time: 9-2  Participation Level: Active  Behavioral Response: CasualAlertDepressed  Type of Therapy: Group Therapy, Psychoeducation, Psychotherapy; Individual therapy  Treatment Goals addressed: Coping  Interventions: CBT, DBT, Supportive and Reframing  Summary:  9:00 - 10:00: Individual session. 10:00 - 10:30: Pharmacist discussed medication with group and answered questions.  10:30 -12:00: Clinician led check-in regarding current stressors and situation, and review of patient completed daily inventory. Clinician utilized active listening and empathetic response and validated patient emotions. Clinician facilitated processing group on pertinent issues.  12:00 - 12:45 Reflection group: Patients encouraged to practice skills and interpersonal techniques or work on mindfulness and relaxation techniques. The importance of self-care and making skills part of a routine to increase usage were stressed.  12:45 - 1:50 Clinician introduced topic of "Distress Tolerance". Clinician discussed "TIPS", "ACCEPTS", and "STOP" and how/when patients can employ these methods to help. Patients identified when these techniques may be helpful in their personal lives.  1:50 - 2:00 Clinician led check-out. Clinician assessed for immediate needs, medication compliance and efficacy, and safety concerns.   Suicidal/Homicidal: Yeswithout intent/plan  Therapist Response: Jamie Bennett is a 56 y.o. female who presents with depression and anxiety symptoms. Patient arrived within time allowed and reports she is feeling "pretty low". Patient rates her mood at a 2.5 on a 1- 10 scale with 10 being great. Patient reports going to the [facility] store Friday and drinking.  Pt reports she hurt herself over the weekend by beating herself with her sewing shears and stabbing her leg.  Pt reports husband took all sharp objects, such as shears, scissors,  and needles, away.  Pt reports she hurt herself due to her husband not paying "enough attention" to her.  Cln verbally safety planned with pt.  Pt verbally contracted for safety. Pt's husband will pick her up from group so she does not have the opportunity to stop by the [facility] store.  Patient engaged in activity and discussion. Patient demonstrates some progress as evidenced by reporting mood at a 7 by end of group.  Patient denies SI/HI/self-harm thoughts at end of session.  Plan: Patient will continue in PHP and medication management while working on decreasing depression symptoms and increasing distress tolerance skills.  Diagnosis: Severe bipolar I disorder, current or most recent episode depressed ([place]) [F31.4]    1. Severe bipolar I disorder, current or most recent episode depressed ([place])     [name] J [name], LPCA 02/26/2017

## 2017-02-27 ENCOUNTER — Other Ambulatory Visit (HOSPITAL_COMMUNITY): Payer: Medicare HMO | Admitting: Licensed Clinical Social Worker

## 2017-02-27 ENCOUNTER — Inpatient Hospital Stay: Payer: Medicare HMO

## 2017-02-27 ENCOUNTER — Other Ambulatory Visit (HOSPITAL_COMMUNITY): Payer: Medicare HMO

## 2017-02-27 DIAGNOSIS — Z79899 Other long term (current) drug therapy: Secondary | ICD-10-CM | POA: Diagnosis not present

## 2017-02-27 DIAGNOSIS — F332 Major depressive disorder, recurrent severe without psychotic features: Secondary | ICD-10-CM | POA: Diagnosis not present

## 2017-02-27 DIAGNOSIS — E538 Deficiency of other specified B group vitamins: Secondary | ICD-10-CM | POA: Diagnosis not present

## 2017-02-27 DIAGNOSIS — F314 Bipolar disorder, current episode depressed, severe, without psychotic features: Secondary | ICD-10-CM

## 2017-02-27 MED ORDER — CYANOCOBALAMIN 1000 MCG/ML IJ SOLN
1000.0000 ug | Freq: Once | INTRAMUSCULAR | Status: AC
  Administered 2017-02-27: 1000 ug via INTRAMUSCULAR
  Filled 2017-02-27: qty 1

## 2017-02-27 NOTE — Progress Notes (Signed)
Individual session as component of PHP  Cln met with pt to discuss heightened emotional state. Pt reports having a very poor weekend and engaging in self harm to manage emotions. Pt reports still having a low mood and feeling unable to contract for safety from self harm. Pt reports feelings of hopelessness and denies plan/intent to commit suicide.  Pt shares struggles with feeling as if she has a lack of freedom. Pt also identifies she has lack of skills to deal with her stressors. Pt was able to reframe some perceptions regarding situations this weekend. Cln informed pt that distress tolerance skills are the topic for group today and tomorrow . Pt states it "just helps talking it out. I feel like there is support here, which I don't always feel at home." Pt engaged with cln in safety planning. Pt states that her husband has already removed sharps from pt and controls her medication. At end of session, pt was able to verbally contract for safety.   Lorin Glass, MSW LCSW LCAS

## 2017-02-28 ENCOUNTER — Other Ambulatory Visit (HOSPITAL_COMMUNITY): Payer: Medicare HMO | Attending: Psychiatry | Admitting: Licensed Clinical Social Worker

## 2017-02-28 DIAGNOSIS — F329 Major depressive disorder, single episode, unspecified: Secondary | ICD-10-CM | POA: Insufficient documentation

## 2017-02-28 DIAGNOSIS — F332 Major depressive disorder, recurrent severe without psychotic features: Secondary | ICD-10-CM | POA: Diagnosis present

## 2017-02-28 DIAGNOSIS — Z79899 Other long term (current) drug therapy: Secondary | ICD-10-CM | POA: Insufficient documentation

## 2017-02-28 DIAGNOSIS — F314 Bipolar disorder, current episode depressed, severe, without psychotic features: Secondary | ICD-10-CM

## 2017-02-28 DIAGNOSIS — R69 Illness, unspecified: Secondary | ICD-10-CM | POA: Diagnosis not present

## 2017-02-28 NOTE — Psych (Signed)
   [facility] BH PHP THERAPIST PROGRESS NOTE  KAYIN KETTERING [id number]  Session Time: 9-2  Participation Level: Active  Behavioral Response: CasualAlertDepressed and Euthymic  Type of Therapy: Group Therapy, Psychoeducation, Psychotherapy; Art Therapy  Treatment Goals addressed: Coping  Interventions: CBT, DBT, Supportive and Reframing  Summary:  9:00 - 10:30 Clinician led check-in regarding current stressors and situation, and review of patient completed daily inventory. Clinician utilized active listening and empathetic response and validated patient emotions. Clinician facilitated processing group on pertinent issues.  10:30 -11:30 ?Clinician facilitated art therapy group on mind mapping where pt's created visual interpretation of what they feel the landscape of their mind looks like.  11:30 - 12:00 Clinician led psychoeducation group on distress tolerance. Self Soothe skill was introduced and patients discussed how to utilize it.  12:00 - 12:50 Reflection group: Patients encouraged to practice skills and interpersonal techniques or work on mindfulness and relaxation techniques. The importance of self-care and making skills part of a routine to increase usage were stressed.  12:50 - 1:50 Clinician continued topic of distress tolerance skill and introduced ACCEPTS skill. ?  1:50 - 2:00 Clinician led check-out. Clinician assessed for immediate needs, medication compliance and efficacy, and safety concerns.    Suicidal/Homicidal: Nowithout intent/plan  Therapist Response: NATALEAH SCIONEAUX is a 56 y.o. female who presents with depression and anxiety symptoms. Patient arrived within time allowed and reports she is feeling "pretty great". Patient rates her mood at a 9 on a 1- 10 scale with 10 being great. Patient reports having a wonderful afternoon with her husband at the craft store.  Pt was able to finish a skirt she has been working on.  Pt was able to drive herself to group today and  felt a sense of accomplishment for doing so.  Pt states she is "cautiously optimistic" about the future and getting better.  Pt discussed how using distraction can help her when she is having "negative thoughts".  Pt utilized illustrations with talk bubbles in her Art Therapy activity and shared about the battle between emotional and logical mind.  Patient engaged in activity and discussion. Patient demonstrates some progress as evidenced by reporting increased willingness to use skills learned in group outside of group.  Patient denies SI/HI/self-harm thoughts at end of session.  Plan: Patient will continue in PHP and medication management while working on decreasing depression symptoms and increasing distress tolerance skills.  Diagnosis: Severe bipolar I disorder, current or most recent episode depressed ([place]) [F31.4]    1. Severe bipolar I disorder, current or most recent episode depressed ([place])     Paxtyn Boyar J Kamica Florance, LPCA 02/28/2017

## 2017-02-28 NOTE — Progress Notes (Signed)
GROUP NOTE - spiritual care group 02/28/2017 10:30 - 12:00  Facilitated by Simone Curia, MDiv and Lorin Glass, CSW.   Group focused on topic of strength.  Group members reflected on what thoughts and feelings emerge when they hear this topic.  They then engaged in therapeutic art activity.  Reflected on The topic of strength and represented what strength had been to them in their lives (images and patterns given) and what they saw as helpful in their life now.  What they needed / wanted.   Engaged in facilitated sharing of insights from activity.  Activity drew on narrative framework    Jamie Bennett was present throughout group.  Alert and engaged voluntarily with other group members.   Jamie Bennett created layers in her art piece and shared how she was recognizing images of strength and behaviors she had adhered to prior to coming to [facility] and ones she was noticing afterward.  Detailed that several things belong on both sides.  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  Feels this is a good mantra for her in the emotional process she is walking through    [employer] [place], MDiv

## 2017-03-01 ENCOUNTER — Other Ambulatory Visit (HOSPITAL_COMMUNITY): Payer: Medicare HMO | Admitting: Licensed Clinical Social Worker

## 2017-03-01 DIAGNOSIS — F314 Bipolar disorder, current episode depressed, severe, without psychotic features: Secondary | ICD-10-CM

## 2017-03-01 DIAGNOSIS — F329 Major depressive disorder, single episode, unspecified: Secondary | ICD-10-CM | POA: Diagnosis not present

## 2017-03-01 DIAGNOSIS — F319 Bipolar disorder, unspecified: Secondary | ICD-10-CM

## 2017-03-01 NOTE — Psych (Signed)
   [facility] BH PHP THERAPIST PROGRESS NOTE  Jamie Bennett [id number]  Session Time: 9-2  Participation Level: Active  Behavioral Response: CasualAlertDepressed and Euthymic  Type of Therapy: Group Therapy; Psychotherapy; Spiritual Care; Activity Therapy, Art Therapy  Treatment Goals addressed: Coping; depression  Interventions: CBT, DBT, Supportive and Reframing  Summary:  9:00 - 10:30 Clinician led check-in regarding current stressors and situation, and review of patient completed daily inventory. Clinician utilized active listening and empathetic response and validated patient emotions. Clinician facilitated processing group on pertinent issues.  10:30 -12:00 Spiritual care group  12:00 - 12:45 Reflection group: Patients encouraged to practice skills and interpersonal techniques or work on mindfulness and relaxation techniques. The importance of self-care and making skills part of a routine to increase usage were stressed.  12:45 - 1:50 Relaxation group: Cln Jan Fireman led yoga group focused on retraining the body's response to stress.  1:50 - 2:00 Clinician led check-out. Clinician assessed for immediate needs, medication compliance and efficacy, and safety concerns.   Suicidal/Homicidal: Nowithout intent/plan  Therapist Response: [name] is a 56 y.o. female who presents with depression and anxiety symptoms. Patient arrived within time allowed and reports she is feeling "good". Patient rates her mood at a 8.5 on a 1- 10 scale with 10 being great. Patient reports having a productive afternoon with grocery shopping, despite physical pain.  Pt reports being able to speak with her son and grandson, which made her evening "great".  Pt states her husband "gave me a look" that usually upsets her, but she was able to use skills learned in group to ignore it and move on.  Pt reports she did not sleep well last night despite taking medication.  Patient engaged in activity and  discussion. Patient demonstrates some progress as evidenced by reporting the use of skills learned within group outside of group.  Patient denies SI/HI/self-harm thoughts at end of session.  Plan: Patient will continue in PHP and medication management while working on decreasing depression symptoms and increasing distress tolerance skills and coping skills.  Diagnosis: Severe bipolar I disorder, current or most recent episode depressed ([place]) [F31.4]    1. Severe bipolar I disorder, current or most recent episode depressed ([place])     [name] J Aava Deland, LPCA 03/01/2017

## 2017-03-01 NOTE — Progress Notes (Signed)
Progress note  [name] said she had a hard weekend but is feeling better now.  She is trying to be more assertive with her husband and so far it is helping as he is responding positively rather than defensively.  No   issue with medications or side effects.  Still has difficulty concentrating, finishing tasks, sorting out tasks and where to start, being distracted by her own thoughts and other things,procrastinating.  She was taking Provigil and that was helping but overdosed on it at one point.   Plan:  Continue venlafaxine XR 225 mg, trazodone 200 mg hs, lamotrigine 150 mg daily and hydroxyzine 10 mg tid prn for anxiety.  Consider re starting Provigil as her husband now controls the dispensation of her medications.

## 2017-03-01 NOTE — Psych (Signed)
   Jamie Bennett  Jamie Bennett [id number]  Session Time: 9-2  Participation Level: Active  Behavioral Response: CasualAlertDepressed and Euthymic  Type of Therapy: Group Therapy; Psychotherapy; Psychoeducation  Treatment Goals addressed: Coping; depression  Interventions: CBT, DBT, Supportive and Reframing  Summary:  9:00 - 10:30: Clinician led check-in regarding current stressors and situation, and review of patient completed daily inventory. Clinician utilized active listening and empathetic response and validated patient emotions. Clinician facilitated processing group on pertinent issues.  10:30 -11:30: Clinician introduced topic of "Feelings and Emotions". Patients discussed how hard emotions and feelings can be to identify, accept, and handle.  11:30 - 12:15: Reflection group: Patients encouraged to practice skills and interpersonal techniques or work on mindfulness and relaxation techniques. The importance of self-care and making skills part of a routine to increase usage were stressed.  12:15 - 1:00: Clinician led feelings [name] game to aid in identification of feelings.  1:00-1:50: Clinician introduced topic of "Emotional Regulation". Group discussed emotional regulation skills such as "opposite action" and "check the facts". Patients identified ways they could use these skills in everyday life. Group discussed ways to actively challenge beliefs.  1:50 - 2:00: Clinician led check-out. Clinician assessed for immediate needs, medication compliance and efficacy, and safety concerns.    Suicidal/Homicidal: Nowithout intent/plan  Therapist Response: [name] is a 56 y.o. female who presents with depression and anxiety symptoms. Patient arrived within time allowed and reports she is feeling "pretty good". Patient rates her mood at a 8.5 on a 1- 10 scale with 10 being great. Patient reports her husband took her jewelry tools which frustrates her,  but she understands he did it so she can't harm herself with them.  Pt states she is growing to accept husbands limits and understands he is placing limits because he cares.  Pt states she is still looking forward to going to see her parents soon, but hasn't been able to call them because she doesn't want to let them down since she is not coming sooner.  Pt states she sees a pattern of "getting out of sorts" at night.  Pt reports she did not sleep well, again, despite taking her sleeping med.  Patient engaged in activity and discussion. Pt was successful in labeling and describing feelings accurately and states it is much more difficult to do this in the moment.  Patient demonstrates some progress as evidenced by reporting the use of skills learned within group outside of group.  Patient denies SI/HI/self-harm thoughts at end of session.  Plan: Patient will continue in PHP and medication management while working on decreasing depression symptoms and increasing distress tolerance skills and coping skills.  Diagnosis: Severe bipolar I disorder, current or most recent episode depressed ([place]) [F31.4]    1. Severe bipolar I disorder, current or most recent episode depressed ([place])     Sukhdeep Wieting J [name], LPCA 03/01/2017

## 2017-03-02 ENCOUNTER — Other Ambulatory Visit (HOSPITAL_COMMUNITY): Payer: Medicare HMO | Admitting: Licensed Clinical Social Worker

## 2017-03-02 ENCOUNTER — Encounter (HOSPITAL_COMMUNITY): Payer: Self-pay | Admitting: Licensed Clinical Social Worker

## 2017-03-02 VITALS — BP 120/70 | HR 84 | Ht 65.5 in | Wt 319.0 lb

## 2017-03-02 DIAGNOSIS — G8929 Other chronic pain: Secondary | ICD-10-CM | POA: Diagnosis not present

## 2017-03-02 DIAGNOSIS — F314 Bipolar disorder, current episode depressed, severe, without psychotic features: Secondary | ICD-10-CM

## 2017-03-02 DIAGNOSIS — F329 Major depressive disorder, single episode, unspecified: Secondary | ICD-10-CM | POA: Diagnosis not present

## 2017-03-02 DIAGNOSIS — M25551 Pain in right hip: Secondary | ICD-10-CM | POA: Diagnosis not present

## 2017-03-02 NOTE — Psych (Signed)
   [facility] BH PHP THERAPIST PROGRESS NOTE  Jamie Bennett [id number]  Session Time: 9-1  Participation Level: Active  Behavioral Response: CasualAlertDepressed and Euthymic  Type of Therapy: Group Therapy; Psychotherapy; Psychoeducation; Activity therapy  Treatment Goals addressed: Coping; depression  Interventions: CBT, DBT, Supportive and Reframing  Summary:  9:00 - 10:30 Clinician led check-in regarding current stressors and situation, and review of patient completed daily inventory. Clinician utilized active listening and empathetic response and validated patient emotions. Clinician facilitated processing group on pertinent issues.  10:30 -11:15 Clinician introduced "Mindfulness". Clinician showed TedTalk on mindfulness and the group discussed. Group discussed "What" and "How" skills of mindfulness.  11:15 - 12:50 Group continued with "Mindfulness" topic. Patients identified what types of activities would help them practice mindfulness. Patients took part in Progressive Muscle Relaxation and other activities and discussed which would work best for them.  12:50 - 1:00 Clinician led check-out. Clinician assessed for immediate needs, medication compliance and efficacy, and safety concerns.    Suicidal/Homicidal: Nowithout intent/plan  Therapist Response: Jamie Bennett is a 56 y.o. female who presents with depression and anxiety symptoms. Patient arrived within time allowed and reports she is feeling "pretty good". Patient rates her mood at a 7.5 on a 1- 10 scale with 10 being great. Patient reports continued struggles in the relationship with her husband. Pt states she feels she is often ignored and does not know how to address that or handle the feelings. Pt was receptive to assertiveness as a possible path to address. Patient engaged in activity and discussion. Pt reports mindfulness guided meditation is what she would most like to practice. Patient demonstrates some progress as  evidenced by increased ability to label her feelings and struggles.  Patient denies SI/HI/self-harm thoughts at end of session.  Plan: Patient will continue in PHP and medication management while working on decreasing depression symptoms and increasing distress tolerance skills and coping skills.  Diagnosis: Severe bipolar I disorder, current or most recent episode depressed ([place]) [F31.4]    1. Severe bipolar I disorder, current or most recent episode depressed ([place])     Lorin Glass, LCSW 03/02/2017

## 2017-03-02 NOTE — Progress Notes (Signed)
Met with patient today as she presented with appropriate affect, depressed mood and discussed her history with limited success with medications, per patient's report except for when she was taking Provigil.  Patient admitted to overusing Provigil 14 months prior and overdosed on it with lasting effects of muscle weakness and fatigue.  Patient reported current medications are doing okay and feels [facility] Hospital[facility] has been helpful.  Patient reported she continues to have unexplained right thigh pain and is seeing her orthopaedist today for MRI results as they think she may have a fracture possibly and she still using a cain currently to help with mobility.  Patient reports pain with Rheumatoid Arthritis and Fibromyalgia ongoing but manages with current medications.  Patient denies any current suicidal or homicidal ideations and plans to follow up with Dr. Einar Grad after completing PHP.  Patient would like Dr. Lovena Le and Dr. Einar Grad to continue to discuss possibility with retrying patient on Provigil again as states her husband keeps all medications locked up now and helps her manage them so she is not tempted to overuse.  Patient reports over 10 past overdose attempts but denies any plans to harm self at this time or again and no thoughts of wanting to harm others.  Patient denies any auditory or visual hallucinations and will continue to work with Dr. Lovena Le on medication adjustments and recommendations.  Patient currently rates he depression a 9, anxiety a 6, and hopelessness a 7 on a scale of 0-10 with 0 being none and 10 the worst she could experience.  Patient does report adequate sleep and no side effects to current medication regimen.  Patient to contact this nurse or PHP staff as needed.

## 2017-03-05 ENCOUNTER — Other Ambulatory Visit (HOSPITAL_COMMUNITY): Payer: Medicare HMO | Admitting: Licensed Clinical Social Worker

## 2017-03-05 DIAGNOSIS — F329 Major depressive disorder, single episode, unspecified: Secondary | ICD-10-CM | POA: Diagnosis not present

## 2017-03-05 DIAGNOSIS — F314 Bipolar disorder, current episode depressed, severe, without psychotic features: Secondary | ICD-10-CM

## 2017-03-06 ENCOUNTER — Other Ambulatory Visit (HOSPITAL_COMMUNITY): Payer: Medicare HMO

## 2017-03-06 ENCOUNTER — Other Ambulatory Visit (HOSPITAL_COMMUNITY): Payer: Medicare HMO | Admitting: Licensed Clinical Social Worker

## 2017-03-06 DIAGNOSIS — F329 Major depressive disorder, single episode, unspecified: Secondary | ICD-10-CM | POA: Diagnosis not present

## 2017-03-06 DIAGNOSIS — F314 Bipolar disorder, current episode depressed, severe, without psychotic features: Secondary | ICD-10-CM

## 2017-03-06 NOTE — Psych (Signed)
   [facility] BH PHP THERAPIST PROGRESS NOTE  Jamie Bennett [id number]  Session Time: 9-2  Participation Level: Active  Behavioral Response: CasualAlertDepressed and Euthymic  Type of Therapy: Group Therapy; Psychotherapy; Psychoeducation;  Treatment Goals addressed: Coping; depression  Interventions: CBT, DBT, Supportive and Reframing  Summary:  9:00 - 10:30 Clinician led check-in regarding current stressors and situation, and review of patient completed daily inventory. Clinician utilized active listening and empathetic response and validated patient emotions. Clinician facilitated processing group on pertinent issues.  10:30 -12:00: Clinician introduced topic of "Communication". Clinician explained the difference between types of communication, communication styles, and healthy/unhealthy communication. Patients identified communication styles in short video clips. Patients identified their main form of communication and reasons why it works, or reasons to think about changing it.  12:00 - 12:30 Reflection group: Patients encouraged to practice skills and interpersonal techniques or work on mindfulness and relaxation techniques. The importance of self-care and making skills part of a routine to increase usage were stressed.  12:30 - 1:15 Clinician continued with topic of "Communication". Group discussed "I-Statements" and how using these instead of "you-statements" could be beneficial in their lives.  *1:15 - 1:25: Clinician led check-out. Clinician assessed for immediate needs, medication compliance and efficacy, and safety concerns.  *Group let out early due to air conditioning issues; the room became too warm and clients were having trouble focusing.  Suicidal/Homicidal: Nowithout intent/plan  Therapist Response: Jamie Bennett is a 56 y.o. female who presents with depression and anxiety symptoms. Patient arrived within time allowed and reports she is feeling "productive". Patient  rates her mood at a 9 on a 1- 10 scale with 10 being great. Patient reports she was able to discuss getting her jewelry making tools back with her husband.  Pt states it was hard but worth being assertive.  Pt still feels very passive in communication with husband.  Pt states she was able to call her parents and discuss what is going on with treatment and when she would be able to visit.  Pt reports this provided a sense of relieve and support.  Pt states she pushed herself to walk around the block twice over the weekend despite lack of motivation and energy.  Pt reports she is still struggling with motivation and energy daily which makes it hard to set and meet goals.  Patient engaged in activity and discussion.  Pt states she is typically passive and wants to work on being more assertive, especially with her husband.  Patient demonstrates some progress as evidenced by reports of decrease in self-harm thoughts and actions.  Patient denies SI/HI/self-harm thoughts at end of session.  Plan: Patient will continue in PHP and medication management while working on decreasing depression symptoms and increasing distress tolerance skills and coping skills.  Diagnosis: Severe bipolar I disorder, current or most recent episode depressed ([place]) [F31.4]    1. Severe bipolar I disorder, current or most recent episode depressed ([place])     Tanner Vigna J Atlee Kluth, LPCA 03/06/2017

## 2017-03-06 NOTE — Psych (Signed)
Individual Therapy 12:45-1:30:  Pt discussed her nervousness about discharging from the program.  Pt states she has learned a lot from the program, but worries she will not continue to see improvement without the support.  Cln talked to pt about taking classes and attending support groups through the [facility] for continued support.  Pt stated she was very interested in looking into these options.  Cln and pt discussed the importance of a schedule to continue getting the pt out of the house after PHP is over.  Pt stated she will work on one to have multiple things to do each week.  Pt states she is still working on assertiveness with her husband, but she is feeling better about it day by day.  Pt states she is learning to deal with some of the "consequences" of being assertive, like the eye-rolls and face he gives her.  Pt states she is being assertive about going to see her family at the end of August, but not so assertive as to say she wants to go for 2 weeks.  Cln and pt discussed options and that it is the patients choice of when to be assertive.  Pt states she has only had 1 time over the past few days where she thought about hurting herself.  Pt states she was able to use distraction and pushing away to make it pass within 15 minutes.  Pt reports she feels hopeful about getting better.  Ilee Randleman J. Kaylie Ritter, LPCA

## 2017-03-06 NOTE — Psych (Signed)
   [facility] BH PHP THERAPIST PROGRESS NOTE  JOCILYNN GRADE [id number]  Session Time: 9-2  Participation Level: Active  Behavioral Response: CasualAlertDepressed and Euthymic  Type of Therapy: Group Therapy; Psychotherapy; Psychoeducation; Individual Therapy  Treatment Goals addressed: Coping; depression  Interventions: CBT, DBT, Supportive and Reframing  Summary:  9:00 - 10:30 Clinician led check-in regarding current stressors and situation, and review of patient completed daily inventory. Clinician utilized active listening and empathetic response and validated patient emotions. Clinician facilitated processing group on pertinent issues.  10:30 -12:00: Clinician continued with "I-Statements". Group worked together to create ways to use "I statements" in a "natural" way to use in everyday communication. Clinician introduced topic of "Active Listening". Group discussed how to use active listening and how it can help with communication.  12:00 - 12:45: Reflection group: Patients encouraged to practice skills and interpersonal techniques or work on mindfulness and relaxation techniques. The importance of self-care and making skills part of a routine to increase usage were stressed.  *12:45 - 1:50: Clinician introduced topic of "fair fighting rules". Patients identified which rules they already follow, and which rules they need to be more aware of for better communication in the future.  1:50 - 2:00 Clinician led check-out. Clinician assessed for immediate needs, medication compliance and efficacy, and safety concerns.  ?*Patient was pulled for individual therapy for a portion of this time.  Suicidal/Homicidal: Nowithout intent/plan  Therapist Response: Jamie Bennett is a 56 y.o. female who presents with depression and anxiety symptoms. Patient arrived within time allowed and reports she is feeling "OK". Patient rates her mood at a 5 on a 1- 10 scale with 10 being great. Pt reports she  is struggling with lack of motivation and energy and finding it difficult to complete tasks.  Pt states she had an hour of alone time last night at home, and stated it felt nice to have the space to herself.   Patient engaged in activity and discussion.  Pt states she feels like using I statements could help her be assertive with her husband and others.  Patient demonstrates some progress as evidenced by increased number to 9 at end of day.  Patient denies SI/HI/self-harm thoughts at end of session.  Plan: Patient will continue in PHP and medication management while working on decreasing depression symptoms and increasing distress tolerance skills and coping skills.  Diagnosis: Severe bipolar I disorder, current or most recent episode depressed ([place]) [F31.4]    1. Severe bipolar I disorder, current or most recent episode depressed ([place])     Geofrey Silliman J  [place], LPCA 03/06/2017

## 2017-03-07 ENCOUNTER — Other Ambulatory Visit (HOSPITAL_COMMUNITY): Payer: Medicare HMO | Admitting: Licensed Clinical Social Worker

## 2017-03-07 DIAGNOSIS — F3162 Bipolar disorder, current episode mixed, moderate: Secondary | ICD-10-CM

## 2017-03-07 DIAGNOSIS — F329 Major depressive disorder, single episode, unspecified: Secondary | ICD-10-CM | POA: Diagnosis not present

## 2017-03-07 NOTE — Progress Notes (Signed)
Progress note  I called Ms Haxton's husband on speaker phone which unfortunately put both him and her on the spot to talk about the possibility of re trying Provigil.  She says it helped a lot in the past but admits she misused it and deceived him to bypass his controls to get it.  When she was hospitalized in [place] they made it clear that the Provigil was the problem.  Consequently her husband is understandably very uncomfortable in trying it again.  She proposed trying it for a month only with his having complete control of picking it up at the pharmacy and dispensing it.  My wish in trying it or something similar is that she seems ready to make changes but does not have the energy or motivation to try or to persist in things such as exercising or dieting and a stimulant might be just the push she needs to get to the next step in her recovery.  The alternative is continuing depression with suicidal risk.       She handled the disappointment well and might bring it up again when she gets home.  Overall she looks considerably better here but her husband says he does not see much change at home.

## 2017-03-08 ENCOUNTER — Other Ambulatory Visit (HOSPITAL_COMMUNITY): Payer: Medicare HMO | Admitting: Licensed Clinical Social Worker

## 2017-03-08 ENCOUNTER — Other Ambulatory Visit (HOSPITAL_COMMUNITY): Payer: Medicare HMO

## 2017-03-08 DIAGNOSIS — F332 Major depressive disorder, recurrent severe without psychotic features: Secondary | ICD-10-CM

## 2017-03-08 DIAGNOSIS — F329 Major depressive disorder, single episode, unspecified: Secondary | ICD-10-CM | POA: Diagnosis not present

## 2017-03-08 MED ORDER — VENLAFAXINE HCL 75 MG PO TABS: 225.0000 mg | ORAL_TABLET | Freq: Every morning | ORAL | 1 refills | Status: DC

## 2017-03-08 MED ORDER — TRAZODONE HCL 100 MG PO TABS: ORAL_TABLET | ORAL | 1 refills | Status: DC

## 2017-03-08 NOTE — Psych (Signed)
   [facility] PHP THERAPIST PROGRESS NOTE  Jamie Bennett [id number]  Session Time: 9-1:30  Participation Level: Active  Behavioral Response: CasualAlertDepressed  Type of Therapy: Group Therapy;  Individual Therapy  Treatment Goals addressed: Coping; depression  Interventions: CBT, DBT, Supportive and Reframing  Summary:  9:00 - 11:00 Clinician led check-in regarding current stressors and situation, and review of patient completed daily inventory. Clinician utilized active listening and empathetic response and validated patient emotions. Clinician facilitated processing group on pertinent issues.  11:00 -12:00: Chaplaincy group on grief and loss.  12:00 - 12:50 Individual therapy. Cln and pt discussed interpersonal problems pt identified in her marriage. Cln and pt discussed ways to increase assertiveness and compromise into the relationship with her husband.    12:50 - 1:20 Pt met with psychiatrist 1:20 - 1:30 Clinician led check-out. Clinician assessed for immediate needs, medication compliance and efficacy, and safety concerns.  ?  Suicidal/Homicidal: Nowithout intent/plan  Therapist Response: ANNAKATE SOULIER is a 56 y.o. female who presents with depression and anxiety symptoms. Patient arrived within time allowed and reports she is feeling "OK". Patient rates her mood at a 4 on a 1- 10 scale with 10 being great. Pt again shared a desire to visit her family in [place] and was able to state benefits to the visit. Pt shares that her husband does not believe she is making progress and that has upset her and made her doubt the progress she knows she has made. Pt states understanding of why he said that and struggles to not take it personally. Pt states she can see progress he has made and feels that low motivation and low energy remain as her biggest barriers currently. Pt engaged in discussion. Patient demonstrates some progress as evidenced by accepting set backs with rational  thought and quick recall of skills. Patient denies SI/HI/self-harm thoughts at end of session.  Plan: Patient will continue in PHP and medication management while working on decreasing depression symptoms and increasing distress tolerance skills and coping skills.  Diagnosis: Bipolar 1 disorder, mixed, moderate ([facility]) [F31.62]    1. Bipolar 1 disorder, mixed, moderate ([place])     Lorin Glass, LCSW 03/08/2017

## 2017-03-08 NOTE — Psych (Signed)
[facility] BH PHP THERAPIST PROGRESS NOTE  Jamie Bennett [id number]  Session Time: 9-2  Participation Level: Active  Behavioral Response: CasualAlertDepressed and Euthymic  Type of Therapy: Group Therapy; Psychotherapy; Psychoeducation; Art Therapy  Treatment Goals addressed: Coping; depression  Interventions: CBT, DBT, Supportive and Reframing  Summary:  9:00 -10:30: Clinician led check-in regarding current stressors and situation, and review of patient completed daily inventory. Clinician utilized active listening and empathetic response and validated patient emotions. Clinician facilitated processing group on pertinent issues.  10:30 - 11:45: Clinician introduced topic of "Values and Goals". Group discussed why values and having goals are important and why it is helpful to know what values/goals are most important to self. Group discussed how to find out what values are most important to self. Group completed a values worksheet to help. Group discussed why certain values are more important to self than others. Group completed a goals worksheet to help narrow down steps to working towards a goal. Group discussed how goals and values are related  11:45 - 12:30: Group completed a "Values and Goals" collage to help remind self of what is important when needing reminding.  12:30 - 1:00: Reflection group: Patients encouraged to practice skills and interpersonal techniques or work on mindfulness and relaxation techniques. The importance of self-care and making skills part of a routine to increase usage were stressed.  1:00 - 1:50: Clinician introduced topic of fear related to goals. Group watched "Why You Should Define Your Fears Instead of Your Goals" Ted-Talk. Patients discussed fear setting and how to utilize it as a skill.  1:50 - 2:00 Clinician led check-out. Clinician assessed for immediate needs, medication compliance and efficacy, and safety concerns.    Suicidal/Homicidal: Nowithout  intent/plan  Therapist Response: Jamie Bennett is a 56 y.o. female who presents with depression and anxiety symptoms. Patient arrived within time allowed and reports she is feeling "OK". Patient rates her mood at a 3 on a 1- 10 scale with 10 being great. Pt reports she continues to  struggle with lack of motivation and energy.  Pt presents today reporting "I am not going home today." Pt states she has decided she does not want to be with her husband and knows if she goes home "he'll convince me why I can't do it. I need to go now while I have there strength." Pt talked through her plan and engaged in decision making tasks to make eyes wide open decision. After weighing options and making decisions, pt states she will go to the bank after group to withdraw money as her husband has her debit and credit cards. She plans to stay at a hotel to sleep tonight as she did not sleep last night. She states she will call her son to inform her of the plan so he will know she is safe and can relay the message to her husband.  Pt states in the morning she will drive to her parent's house in [place] and stay there while she determines a long term plan. Pt seems logical in her thought processes and determined to follow this plan. Pt denies all SI/HI/self harm thoughts. Pt requests to discharge the program one day early to follow this plan and discharge is approved by the psychiatrist. Pt engaged in activity and discussion. Patient demonstrates some progress as evidenced by increased calm and ability to manage difficult situations. Pt is able to reports skills she can use to manage disruption of mood.   Patient denies SI/HI/self-harm thoughts  at end of session.  Plan: Patient will continue discharge from [facility] today, one day early, per her request. Pt has met treatment goals of decreased depression and anxiety, increased ability to manage moods and increased distress tolerance skills. Pt has noticeably brighter affect and  outlook. Pt's progress is noted by observation, self report, and scales. Pt will discharge to outpatient therapy and psychiatry. Pt requests that her appointments stay local as she plans to return to the area, however states she will call if she needs help to find new providers if she stays in [place]. Pt has appointment with Dr Daron Offer at this agency on 05/01/17 and will follow up with counseling at [facility]. Pt was recommended to engage with [facility] if in [place]. Pt denies all SI/HI/self harm thoughts at discharge.   Diagnosis: Severe recurrent major depression without psychotic features ([place]) [F33.2]    1. Severe recurrent major depression without psychotic features [facility])     Jamie Glass, LCSW 03/08/2017
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
## 2017-03-09 ENCOUNTER — Other Ambulatory Visit (HOSPITAL_COMMUNITY): Payer: Self-pay

## 2017-03-09 DIAGNOSIS — R69 Illness, unspecified: Secondary | ICD-10-CM | POA: Diagnosis not present

## 2017-03-10 DIAGNOSIS — Z136 Encounter for screening for cardiovascular disorders: Secondary | ICD-10-CM | POA: Diagnosis not present

## 2017-03-10 DIAGNOSIS — F319 Bipolar disorder, unspecified: Secondary | ICD-10-CM | POA: Diagnosis not present

## 2017-03-10 DIAGNOSIS — R45851 Suicidal ideations: Secondary | ICD-10-CM | POA: Diagnosis not present

## 2017-03-10 DIAGNOSIS — R197 Diarrhea, unspecified: Secondary | ICD-10-CM | POA: Diagnosis not present

## 2017-03-10 DIAGNOSIS — F4323 Adjustment disorder with mixed anxiety and depressed mood: Secondary | ICD-10-CM | POA: Insufficient documentation

## 2017-03-10 DIAGNOSIS — M069 Rheumatoid arthritis, unspecified: Secondary | ICD-10-CM | POA: Diagnosis not present

## 2017-03-10 DIAGNOSIS — K219 Gastro-esophageal reflux disease without esophagitis: Secondary | ICD-10-CM | POA: Diagnosis not present

## 2017-03-10 DIAGNOSIS — E039 Hypothyroidism, unspecified: Secondary | ICD-10-CM | POA: Diagnosis not present

## 2017-03-10 DIAGNOSIS — R69 Illness, unspecified: Secondary | ICD-10-CM | POA: Diagnosis not present

## 2017-03-10 DIAGNOSIS — F609 Personality disorder, unspecified: Secondary | ICD-10-CM | POA: Diagnosis not present

## 2017-03-10 DIAGNOSIS — F329 Major depressive disorder, single episode, unspecified: Secondary | ICD-10-CM | POA: Insufficient documentation

## 2017-03-10 DIAGNOSIS — I951 Orthostatic hypotension: Secondary | ICD-10-CM | POA: Diagnosis not present

## 2017-03-12 ENCOUNTER — Other Ambulatory Visit (HOSPITAL_COMMUNITY): Payer: Self-pay

## 2017-03-13 ENCOUNTER — Other Ambulatory Visit (HOSPITAL_COMMUNITY): Payer: Self-pay

## 2017-03-14 ENCOUNTER — Telehealth: Payer: Self-pay

## 2017-03-14 ENCOUNTER — Other Ambulatory Visit (HOSPITAL_COMMUNITY): Payer: Self-pay

## 2017-03-14 NOTE — Telephone Encounter (Signed)
Ok , what number should he be called at? Also please let him know that drinking alcohol with any of the medications is harmful.

## 2017-03-14 NOTE — Telephone Encounter (Signed)
I called patient's husband and he reports that patient has been hospitalized in [place]. At this point he is not allowed to talk to the patient are to her doctor. Patient's husband reports that he is concerned that she is getting her doctors there false information. He is asking if this clinician can intervene and the discuss her medication management. He was told that since we have not been asked for any information this clinician cannot intervene in her treatment but he could request records and have them sent to her current place of treatment.

## 2017-03-14 NOTE — Telephone Encounter (Signed)
[phone] should be in the contact info. Hight lighted.

## 2017-03-14 NOTE — Telephone Encounter (Signed)
pt husband called left message on voice mail. stated that he called yesterday and left a message asking for dr. Einar Grad to call him about his wife.    States he needs to speak with dr. Einar Grad. His wife is a inpatient in [place] and he needs to give dr. Einar Grad some information and he wanted to also speak with dr. Einar Grad about the interactions of drinking alcohol with the medications.

## 2017-03-15 ENCOUNTER — Other Ambulatory Visit (HOSPITAL_COMMUNITY): Payer: Self-pay

## 2017-03-16 ENCOUNTER — Other Ambulatory Visit (HOSPITAL_COMMUNITY): Payer: Self-pay

## 2017-03-21 ENCOUNTER — Ambulatory Visit: Payer: Self-pay | Admitting: Licensed Clinical Social Worker

## 2017-04-03 ENCOUNTER — Other Ambulatory Visit: Payer: Self-pay | Admitting: Rheumatology

## 2017-04-03 NOTE — Telephone Encounter (Signed)
Last Visit: 11/28/16 Next Visit: 05/02/17 Labs: 11/28/16 WNL PLQ Eye Exam: 02/29/16 WNL  Left message for patient to call the office. Patient needs updated PLQ eye exam.

## 2017-04-03 NOTE — Telephone Encounter (Signed)
Patient has scheduled appt for PLQ eye exam on 04/10/17 @ 10:30.

## 2017-04-04 ENCOUNTER — Encounter: Payer: Self-pay | Admitting: Psychiatry

## 2017-04-04 ENCOUNTER — Ambulatory Visit (INDEPENDENT_AMBULATORY_CARE_PROVIDER_SITE_OTHER): Payer: Medicare HMO | Admitting: Psychiatry

## 2017-04-04 VITALS — BP 118/82 | HR 85 | Temp 98.8°F | Wt 318.8 lb

## 2017-04-04 DIAGNOSIS — R4589 Other symptoms and signs involving emotional state: Secondary | ICD-10-CM

## 2017-04-04 DIAGNOSIS — F314 Bipolar disorder, current episode depressed, severe, without psychotic features: Secondary | ICD-10-CM | POA: Diagnosis not present

## 2017-04-04 DIAGNOSIS — R69 Illness, unspecified: Secondary | ICD-10-CM | POA: Diagnosis not present

## 2017-04-04 DIAGNOSIS — F332 Major depressive disorder, recurrent severe without psychotic features: Secondary | ICD-10-CM

## 2017-04-04 MED ORDER — VENLAFAXINE HCL 75 MG PO TABS
225.0000 mg | ORAL_TABLET | Freq: Every morning | ORAL | 1 refills | Status: DC
Start: 2017-04-04 — End: 2017-05-01

## 2017-04-04 MED ORDER — BUSPIRONE HCL 10 MG PO TABS: 10.0000 mg | ORAL_TABLET | Freq: Three times a day (TID) | ORAL | 1 refills | Status: DC

## 2017-04-04 MED ORDER — LAMOTRIGINE 150 MG PO TABS: 150.0000 mg | ORAL_TABLET | Freq: Two times a day (BID) | ORAL | 1 refills | Status: DC

## 2017-04-04 MED ORDER — HYDROXYZINE HCL 50 MG PO TABS: 50.0000 mg | ORAL_TABLET | Freq: Two times a day (BID) | ORAL | 1 refills | Status: DC | PRN

## 2017-04-04 NOTE — Telephone Encounter (Signed)
Okay to refill 30 day supply per Dr. Deveshwar 

## 2017-04-04 NOTE — Progress Notes (Signed)
Patient ID: Jamie Bennett, female   DOB: January 24, 1961, 56 y.o.   MRN: [id number] [facility] MD/PA/NP OP Progress Note  04/04/2017 2:01 PM SKYLAH DELAUTER  MRN:  [id number]  Subjective:  Patient returns for follow-up of her generalized anxiety disorder and  bipolar disorder. Patient is accompanied by her husband. Patient most recently was hospitalized in [place].  Today she reports that she is doing better and was started on BuSpar at 10 mg 3 times daily during her hospitalization. Patient also reports that she completed partial hospitalization program prior to being hospitalized. There is a lot of ambiguous  information about the reason for hospitalization. Patient states that she was not doing well and husband reports that she had been drinking. Patient then also told this clinician that she had made a plan to leave her husband and one thing led to the other. Currently this seemed to be together. Patient reports that she has decided to see Dr.Aksir at [facility] and was recommended to do so by Dr. Lovena Le. Patient denies any suicidal thoughts. States that she was restarted on hydroxyzine and that it does seem to be helping somewhat.   Chief Complaint: Anxiety Chief Complaint    Follow-up; Medication Refill     Visit Diagnosis:     ICD-10-CM   1. Severe recurrent major depression without psychotic features ([place]) F33.2   2. Difficulty coping R45.89   3. Severe bipolar I disorder, current or most recent episode depressed ([place]) F31.4     Past Medical History:  Past Medical History:  Diagnosis Date  . Anemia   . Anxiety   . Bipolar 1 disorder ([place])   . Collagen vascular disease ([place])    rhematoid arthritis  . Constipation   . Fibromyalgia   . Hypotension   . Osteoarthritis   . Osteoporosis   . Rheumatoid arthritis ([place])   . Thyroid disease     Past Surgical History:  Procedure Laterality Date  . CHOLECYSTECTOMY    . GASTRIC BYPASS     Family History:  Family History  Problem  Relation Age of Onset  . Depression Mother   . Dementia Mother   . Heart disease Mother   . Parkinson's disease Father    Social History:  Social History   Social History  . Marital status: Married    Spouse name: N/A  . Number of children: N/A  . Years of education: N/A   Social History Main Topics  . Smoking status: Never Smoker  . Smokeless tobacco: Never Used  . Alcohol use No  . Drug use: No  . Sexual activity: No   Other Topics Concern  . None   Social History Narrative  . None   Additional History:   Assessment:   Musculoskeletal: Strength & Muscle Tone: within normal limits Gait & Station: normal Patient leans: N/A  Psychiatric Specialty Exam: Medication Refill     Review of Systems  Psychiatric/Behavioral: Negative for depression, hallucinations, memory loss, substance abuse and suicidal ideas. The patient is nervous/anxious. The patient does not have insomnia.   All other systems reviewed and are negative.   There were no vitals taken for this visit.There is no height or weight on file to calculate BMI.  General Appearance: Fairly Groomed  Eye Contact:  Good  Speech:  Normal Rate  Volume:  Normal  Mood:  Anxious   Affect: Flat   Thought Process:  Circumstantial  Orientation:  Full (Time, Place, and Person)  Thought Content:  Negative  Suicidal Thoughts:  Denies active thoughts   Homicidal Thoughts:  No  Memory:  Immediate;   Good Recent;   Good Remote;   Good  Judgement:  Good  Insight:  Fair  Psychomotor Activity:  Negative  Concentration:  Good  Recall:  Good  Fund of Knowledge: Good  Language: Good  Akathisia:  Negative  Handed:   AIMS (if indicated):    Assets:  Desire for Improvement Social Support  ADL's:  Intact  Cognition: WNL  Sleep:  Okay    Is the patient at risk to self?  No. Has the patient been a risk to self in the past 6 months?  No. Has the patient been a risk to self within the distant past?  Yes.   Is the  patient a risk to others?  No. Has the patient been a risk to others in the past 6 months?  No. Has the patient been a risk to others within the distant past?  No.  Current Medications: Current Outpatient Prescriptions  Medication Sig Dispense Refill  . aspirin EC 81 MG tablet Take 81 mg by mouth daily.    . Cholecalciferol (HM VITAMIN D3) 4000 units CAPS Take 4,000 capsules by mouth daily.    . Coenzyme Q-10 200 MG CAPS Take 200 mg by mouth daily.    . cyanocobalamin (,VITAMIN B-12,) 1000 MCG/ML injection Inject 1,000 mcg into the muscle every 30 (thirty) days.    . diazepam (VALIUM) 5 MG tablet Take 1 tablet (5 mg total) by mouth every 8 (eight) hours as needed for muscle spasms. 20 tablet 0  . esomeprazole (NEXIUM) 40 MG capsule Take 1 capsule (40 mg total) by mouth daily at 12 noon.    . Fiber, Guar Gum, CHEW Chew 10 mg by mouth daily.    . fluticasone (FLONASE) 50 MCG/ACT nasal spray Place 1 spray into both nostrils daily.     . hydroxychloroquine (PLAQUENIL) 200 MG tablet Take 1 tablet by mouth twice a day Monday through Friday 40 tablet 0  . hydrOXYzine (ATARAX/VISTARIL) 10 MG tablet Take 1 tablet (10 mg total) by mouth 3 (three) times daily as needed. 30 tablet 1  . ibuprofen (ADVIL,MOTRIN) 600 MG tablet Take 1 tablet (600 mg total) by mouth every 8 (eight) hours as needed. 30 tablet 0  . Krill Oil 500 MG CAPS Take 500 mg by mouth daily.    Marland Kitchen lamoTRIgine (LAMICTAL) 150 MG tablet Take 1 tablet (150 mg total) by mouth 2 (two) times daily. 60 tablet 1  . levothyroxine (SYNTHROID, LEVOTHROID) 112 MCG tablet Take 112 mcg by mouth daily.    Marland Kitchen loratadine (CLARITIN) 10 MG tablet 10 mg  prn as needed    . Magnesium 400 MG CAPS Take 400 mg by mouth 2 (two) times daily.     . methocarbamol (ROBAXIN) 500 MG tablet 1 at 7 AM when necessary and 1 at 2 PM PRN 60 tablet 3  . Multiple Vitamin (MULTIVITAMIN WITH MINERALS) TABS tablet Take 1 tablet by mouth daily.    Marland Kitchen tiZANidine (ZANAFLEX) 4 MG tablet  Take 4 mg by mouth 2 (two) times daily as needed.     . traZODone (DESYREL) 100 MG tablet Take 2 tablet at night as needed for insomnia 60 tablet 1  . triamcinolone cream (KENALOG) 0.5 %     . venlafaxine (EFFEXOR) 75 MG tablet Take 3 tablets (225 mg total) by mouth every morning. 90 tablet 1  . zoledronic acid (RECLAST) 5 MG/100ML  SOLN injection Inject 5 mg into the vein See admin instructions. Patient takes yearly     No current facility-administered medications for this visit.     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  Insomnia Continue Trazodone to 200mg  po qhs prn.  Panic symptoms Continue hydroxyzine at 50 mg twice daily as needed for anxiety  Patient to follow up with Dr.Aksir at [facility]. She will not be returning to this clinic and recommended to follow up with a therapist at the [facility] clinic.    Marland KitchenHimabindu Demetries Coia 04/04/2017, 2:01 PM

## 2017-04-05 ENCOUNTER — Other Ambulatory Visit (HOSPITAL_COMMUNITY): Payer: Self-pay | Admitting: Psychiatry

## 2017-04-09 DIAGNOSIS — M25551 Pain in right hip: Secondary | ICD-10-CM | POA: Diagnosis not present

## 2017-04-09 DIAGNOSIS — G8929 Other chronic pain: Secondary | ICD-10-CM | POA: Diagnosis not present

## 2017-04-10 DIAGNOSIS — Z79899 Other long term (current) drug therapy: Secondary | ICD-10-CM | POA: Diagnosis not present

## 2017-04-10 DIAGNOSIS — H524 Presbyopia: Secondary | ICD-10-CM | POA: Diagnosis not present

## 2017-04-12 ENCOUNTER — Encounter: Payer: Self-pay | Admitting: Hematology and Oncology

## 2017-04-12 ENCOUNTER — Inpatient Hospital Stay: Payer: Medicare HMO

## 2017-04-12 ENCOUNTER — Inpatient Hospital Stay (HOSPITAL_BASED_OUTPATIENT_CLINIC_OR_DEPARTMENT_OTHER): Payer: Medicare HMO | Admitting: Hematology and Oncology

## 2017-04-12 ENCOUNTER — Telehealth: Payer: Self-pay | Admitting: Hematology and Oncology

## 2017-04-12 ENCOUNTER — Other Ambulatory Visit: Payer: Self-pay | Admitting: *Deleted

## 2017-04-12 ENCOUNTER — Inpatient Hospital Stay: Payer: Medicare HMO | Attending: Hematology and Oncology

## 2017-04-12 VITALS — BP 131/86 | HR 80 | Temp 97.3°F | Resp 12 | Ht 66.0 in | Wt 314.0 lb

## 2017-04-12 DIAGNOSIS — I998 Other disorder of circulatory system: Secondary | ICD-10-CM | POA: Insufficient documentation

## 2017-04-12 DIAGNOSIS — F319 Bipolar disorder, unspecified: Secondary | ICD-10-CM | POA: Diagnosis not present

## 2017-04-12 DIAGNOSIS — E079 Disorder of thyroid, unspecified: Secondary | ICD-10-CM | POA: Diagnosis not present

## 2017-04-12 DIAGNOSIS — Z9884 Bariatric surgery status: Secondary | ICD-10-CM

## 2017-04-12 DIAGNOSIS — M1990 Unspecified osteoarthritis, unspecified site: Secondary | ICD-10-CM | POA: Insufficient documentation

## 2017-04-12 DIAGNOSIS — E538 Deficiency of other specified B group vitamins: Secondary | ICD-10-CM

## 2017-04-12 DIAGNOSIS — Z7982 Long term (current) use of aspirin: Secondary | ICD-10-CM

## 2017-04-12 DIAGNOSIS — R5383 Other fatigue: Secondary | ICD-10-CM | POA: Insufficient documentation

## 2017-04-12 DIAGNOSIS — D509 Iron deficiency anemia, unspecified: Secondary | ICD-10-CM | POA: Insufficient documentation

## 2017-04-12 DIAGNOSIS — Z9049 Acquired absence of other specified parts of digestive tract: Secondary | ICD-10-CM | POA: Insufficient documentation

## 2017-04-12 DIAGNOSIS — Z79899 Other long term (current) drug therapy: Secondary | ICD-10-CM | POA: Insufficient documentation

## 2017-04-12 DIAGNOSIS — M810 Age-related osteoporosis without current pathological fracture: Secondary | ICD-10-CM | POA: Diagnosis not present

## 2017-04-12 DIAGNOSIS — D72819 Decreased white blood cell count, unspecified: Secondary | ICD-10-CM | POA: Insufficient documentation

## 2017-04-12 DIAGNOSIS — M25551 Pain in right hip: Secondary | ICD-10-CM | POA: Diagnosis not present

## 2017-04-12 DIAGNOSIS — I959 Hypotension, unspecified: Secondary | ICD-10-CM | POA: Insufficient documentation

## 2017-04-12 DIAGNOSIS — K5900 Constipation, unspecified: Secondary | ICD-10-CM | POA: Diagnosis not present

## 2017-04-12 DIAGNOSIS — M797 Fibromyalgia: Secondary | ICD-10-CM | POA: Insufficient documentation

## 2017-04-12 DIAGNOSIS — M069 Rheumatoid arthritis, unspecified: Secondary | ICD-10-CM | POA: Insufficient documentation

## 2017-04-12 DIAGNOSIS — G8929 Other chronic pain: Secondary | ICD-10-CM | POA: Diagnosis not present

## 2017-04-12 DIAGNOSIS — F419 Anxiety disorder, unspecified: Secondary | ICD-10-CM | POA: Insufficient documentation

## 2017-04-12 DIAGNOSIS — R69 Illness, unspecified: Secondary | ICD-10-CM | POA: Diagnosis not present

## 2017-04-12 LAB — CBC WITH DIFFERENTIAL/PLATELET
Basophils Absolute: 0 10*3/uL (ref 0–0.1)
Basophils Relative: 0 %
Eosinophils Absolute: 0.2 10*3/uL (ref 0–0.7)
Eosinophils Relative: 5 %
HCT: 35.9 % (ref 35.0–47.0)
Hemoglobin: 12.3 g/dL (ref 12.0–16.0)
Lymphocytes Relative: 40 %
Lymphs Abs: 1.9 10*3/uL (ref 1.0–3.6)
MCH: 30.6 pg (ref 26.0–34.0)
MCHC: 34.4 g/dL (ref 32.0–36.0)
MCV: 88.8 fL (ref 80.0–100.0)
Monocytes Absolute: 0.4 10*3/uL (ref 0.2–0.9)
Monocytes Relative: 8 %
Neutro Abs: 2.3 10*3/uL (ref 1.4–6.5)
Neutrophils Relative %: 47 %
Platelets: 227 10*3/uL (ref 150–440)
RBC: 4.04 MIL/uL (ref 3.80–5.20)
RDW: 13.5 % (ref 11.5–14.5)
WBC: 4.8 10*3/uL (ref 3.6–11.0)

## 2017-04-12 LAB — IRON AND TIBC
Iron: 80 ug/dL (ref 28–170)
Saturation Ratios: 19 % (ref 10.4–31.8)
TIBC: 412 ug/dL (ref 250–450)
UIBC: 332 ug/dL

## 2017-04-12 LAB — FOLATE: Folate: 34 ng/mL (ref >5.9)

## 2017-04-12 LAB — FERRITIN: Ferritin: 28 ng/mL (ref 11–307)

## 2017-04-12 MED ORDER — CYANOCOBALAMIN 1000 MCG/ML IJ SOLN
1000.0000 ug | Freq: Once | INTRAMUSCULAR | Status: AC
  Administered 2017-04-12: 1000 ug via INTRAMUSCULAR
  Filled 2017-04-12: qty 1

## 2017-04-12 NOTE — Progress Notes (Signed)
[facility] day:  04/12/17  Chief Complaint: Jamie Bennett is a 56 y.o. female status post gastric bypass surgery with iron deficiency anemia, B12 deficiency, and leukopenia who is seen for 6 month assessment.  HPI: The patient was last seen in the medical oncology clinic on 10/09/2016.  At that time, she noted ongoing fatigue and shortness of breath with exertion.  Exam was stable.  Counts were normal.  Folate was 37.  She was seen by Dr. Charlean Sanfilippo, chief of gastroenterology at [facility], on 01/23/2017 for abnormal LFTs.   He felt that the increase in liver tests in 2017 was in the setting of the Provigil overdose and the subsequent hospitalization. Since that time, her liver function tests have been normal.  Liver biopsy in 2012 revealed some signs of fibrosis. She was felt to be at risk for nonalcoholic fatty liver disease with her obesity. Routine follow-up was not scheduled.  She has continued to receive B12 monthly (last 02/27/2017).    Symptomatically, she is doing "ok".  She has continued fatigue. She denies other physical complaints of anything today.  She denies ice pica.  She denies chest pain, shortness of breath, and palpitations.    Past Medical History:  Diagnosis Date  . Anemia   . Anxiety   . Bipolar 1 disorder ([place])   . Collagen vascular disease ([place])    rhematoid arthritis  . Constipation   . Fibromyalgia   . Hypotension   . Osteoarthritis   . Osteoporosis   . Rheumatoid arthritis ([place])   . Thyroid disease     Past Surgical History:  Procedure Laterality Date  . CHOLECYSTECTOMY    . GASTRIC BYPASS      Family History  Problem Relation Age of Onset  . Depression Mother   . Dementia Mother   . Heart disease Mother   . Parkinson's disease Father     Social History:  reports that she has never smoked. She has never used smokeless tobacco. She reports that she does not drink alcohol or use drugs.  She lives in  [place].  She is accompanied by her husband today.  Allergies: No Known Allergies  Current Medications: Current Outpatient Prescriptions  Medication Sig Dispense Refill  . aspirin EC 81 MG tablet Take 81 mg by mouth daily.    . busPIRone (BUSPAR) 10 MG tablet Take 1 tablet (10 mg total) by mouth 3 (three) times daily. 90 tablet 1  . Cholecalciferol (HM VITAMIN D3) 4000 units CAPS Take 4,000 capsules by mouth daily.    . Coenzyme Q-10 200 MG CAPS Take 200 mg by mouth daily.    . cyanocobalamin (,VITAMIN B-12,) 1000 MCG/ML injection Inject 1,000 mcg into the muscle every 30 (thirty) days.    . diazepam (VALIUM) 5 MG tablet Take 1 tablet (5 mg total) by mouth every 8 (eight) hours as needed for muscle spasms. 20 tablet 0  . esomeprazole (NEXIUM) 40 MG capsule Take 1 capsule (40 mg total) by mouth daily at 12 noon.    . Fiber, Guar Gum, CHEW Chew 10 mg by mouth daily.    . fluticasone (FLONASE) 50 MCG/ACT nasal spray Place 1 spray into both nostrils daily.     Marland Kitchen gabapentin (NEURONTIN) 300 MG capsule Take by mouth.    . hydroxychloroquine (PLAQUENIL) 200 MG tablet Take 1 tablet by mouth twice a day Monday through Friday 40 tablet 0  . hydrOXYzine (ATARAX/VISTARIL) 50 MG tablet Take  1 tablet (50 mg total) by mouth 2 (two) times daily as needed. 60 tablet 1  . ibuprofen (ADVIL,MOTRIN) 600 MG tablet Take 1 tablet (600 mg total) by mouth every 8 (eight) hours as needed. 30 tablet 0  . Krill Oil 500 MG CAPS Take 500 mg by mouth daily.    Marland Kitchen lamoTRIgine (LAMICTAL) 150 MG tablet Take 1 tablet (150 mg total) by mouth 2 (two) times daily. 60 tablet 1  . levothyroxine (SYNTHROID, LEVOTHROID) 112 MCG tablet Take 112 mcg by mouth daily.    Marland Kitchen loratadine (CLARITIN) 10 MG tablet 10 mg  prn as needed    . Magnesium 400 MG CAPS Take 400 mg by mouth 2 (two) times daily.     . methocarbamol (ROBAXIN) 500 MG tablet 1 at 7 AM when necessary and 1 at 2 PM PRN 60 tablet 3  . Multiple Vitamin (MULTIVITAMIN WITH  MINERALS) TABS tablet Take 1 tablet by mouth daily.    Marland Kitchen tiZANidine (ZANAFLEX) 4 MG tablet Take 4 mg by mouth 2 (two) times daily as needed.     . traZODone (DESYREL) 100 MG tablet Take 2 tablet at night as needed for insomnia 60 tablet 1  . triamcinolone cream (KENALOG) 0.5 %     . venlafaxine (EFFEXOR) 75 MG tablet Take 3 tablets (225 mg total) by mouth every morning. 90 tablet 1  . zoledronic acid (RECLAST) 5 MG/100ML SOLN injection Inject 5 mg into the vein See admin instructions. Patient takes yearly     No current facility-administered medications for this visit.     Review of Systems:  GENERAL:  Fatigue.  No fevers or sweats.  Weight stable. PERFORMANCE STATUS (ECOG):  1 HEENT:  No visual changes, runny nose, sore throat, mouth sores or tenderness. Lungs: Shortness of breath with exertion.  No cough.  No hemoptysis. Cardiac:  No chest pain, palpitations, orthopnea, or PND. GI:  No nausea, vomiting, diarrhea, constipation, melena or hematochezia. GU:  No urgency, frequency, dysuria, or hematuria. Musculoskeletal:  Bones and joints hurt all over.  Extremities:  No pain or swelling. Skin:  No rashes or skin changes. Neuro:  No headache, numbness or weakness, balance or coordination issues. Endocrine:  Hot flashes twice a week. No diabetes.  Thyroid issues on Synthroid.   Psych:  Emotional.  Anxiety. Pain:  Joint pain. Pain in bilateral feet.. Review of systems:  All other systems reviewed and found to be negative.  Physical Exam: Blood pressure 131/86, pulse 80, temperature (!) 97.3 F (36.3 C), temperature source Tympanic, resp. rate 12, height 5\' 6"  (1.676 m), weight (!) 314 lb (142.4 kg), SpO2 97 %. GENERAL:  Well developed, well nourished, heavyset woman sitting comfortably in the exam room in no acute distress.  She has a cane at her side. MENTAL STATUS:  Alert and oriented to person, place and time. HEAD:  Shoulder length brown hair.  Normocephalic, atraumatic, face  symmetric, no Cushingoid features. EYES:  Gold rimmed glasses.  Brown eyes.  Pupils equal round and reactive to light and accomodation.  No conjunctivitis or scleral icterus. ENT:  Oropharynx clear without lesion.  Tongue normal. Mucous membranes moist.  RESPIRATORY:  Clear to auscultation without rales, wheezes or rhonchi. CARDIOVASCULAR:  Regular rate and rhythm without murmur, rub or gallop. ABDOMEN:  Fully round.  Soft, non-tender, with active bowel sounds, and no hepatosplenomegaly.  No masses. SKIN:  No rashes, ulcers or lesions. EXTREMITIES: No edema, no skin discoloration or tenderness.  No palpable cords. LYMPH  NODES: No palpable cervical, supraclavicular, axillary or inguinal adenopathy  NEUROLOGICAL: Limited ROM left arm. PSYCH:  Appropriate.   Appointment on 04/12/2017  Component Date Value Ref Range Status  . WBC 04/12/2017 4.8  3.6 - 11.0 K/uL Final  . RBC 04/12/2017 4.04  3.80 - 5.20 MIL/uL Final  . Hemoglobin 04/12/2017 12.3  12.0 - 16.0 g/dL Final  . HCT 04/12/2017 35.9  35.0 - 47.0 % Final  . MCV 04/12/2017 88.8  80.0 - 100.0 fL Final  . MCH 04/12/2017 30.6  26.0 - 34.0 pg Final  . MCHC 04/12/2017 34.4  32.0 - 36.0 g/dL Final  . RDW 04/12/2017 13.5  11.5 - 14.5 % Final  . Platelets 04/12/2017 227  150 - 440 K/uL Final  . Neutrophils Relative % 04/12/2017 47  % Final  . Neutro Abs 04/12/2017 2.3  1.4 - 6.5 K/uL Final  . Lymphocytes Relative 04/12/2017 40  % Final  . Lymphs Abs 04/12/2017 1.9  1.0 - 3.6 K/uL Final  . Monocytes Relative 04/12/2017 8  % Final  . Monocytes Absolute 04/12/2017 0.4  0.2 - 0.9 K/uL Final  . Eosinophils Relative 04/12/2017 5  % Final  . Eosinophils Absolute 04/12/2017 0.2  0 - 0.7 K/uL Final  . Basophils Relative 04/12/2017 0  % Final  . Basophils Absolute 04/12/2017 0.0  0 - 0.1 K/uL Final    Assessment:  Jamie Bennett is a 56 y.o. female female with a history of gastric bypass surgery in 2003 and subsequent B12 deficiency and iron  deficiency anemia. Last colonoscopy was 7 years ago. Diet is good. She eats rich foods. She Is unable to absorb oral iron.   She received IV iron in 02/2014. She received Feraheme 510 mg on 09/15/2015 and 09/22/2015.    Ferritin has been followed: 38 on 02/05/2015, 160 on 05/14/2015, 14 on 09/13/2015, 373 on 10/13/2015, 217 on 12/21/2015, 102 on 04/24/2016, 73 on 07/10/2016, 39 on 10/09/2016, 37 on 01/01/2017, and 28 on 04/12/2017.  She receives B12 monthly (last 02/27/2017).   Folate was 37.0 on 10/09/2016.  She has an appointment for a colonoscopy in 12/2016  She also has a has a history of rheumatoid arthritis for which she received Humira in the past as well as methotrexate. Humira caused leukopenia. Methotrexate caused liver toxicity. She is currently on Plaquenil.   She was admitted from 11/08/2015 - 11/19/2015 with altered mental status secondary to an overdose of  Provigil.  Course was complicated by metabolic encephalopathy, intubation, acute renal failure requiring hemodialysis, MSSA pneumonia, Klebsiella UTI, and acute hepatic failure.  CBC at discharge included a hematocrit of 27.1, hemoglobin 9.2, MCV 95.1, platelets 103,000, and WBC 8900.    Symptomatically, she is fatigued.  Her weight is stable.  Exam is stable.  Counts are normal.  Plan: 1.  Labs today:  CBC with diff, ferritin, iron studies, folate. 2.  B12 today and monthly x 5. 3.  Call with lab results today. If ferritin continues to be low, we will schedule patient for Feraheme. 4.  Refer to [place] for IDA.  5.  RTC in 3 months for labs (CBC with diff, ferritin) + B12. 6.  RTC in 6 months for MD assessment, labs (CBC with diff, ferritin, iron studies, folate), and B12.   Honor Loh, NP  04/12/2017, 11:37 AM   I saw and evaluated the patient, participating in the key portions of the service and reviewing pertinent diagnostic studies and records.  I reviewed the  nurse practitioner's note and agree with  the findings and the plan.  The assessment and plan were discussed with the patient.  A few questions were asked by the patient and answered.   Lequita Asal, MD 04/12/2017

## 2017-04-12 NOTE — Telephone Encounter (Signed)
PET not scheduled as per [facility] is closed for the day. Will send msg to Dr Celedonio Miyamoto, [facility]ac[facility].

## 2017-04-12 NOTE — Progress Notes (Signed)
Patient here for follow up no changes since last appointment 

## 2017-04-12 NOTE — Telephone Encounter (Signed)
Correction: It's G.I. Consult and not PET. MF

## 2017-04-16 ENCOUNTER — Telehealth: Payer: Self-pay | Admitting: Hematology and Oncology

## 2017-04-16 NOTE — Telephone Encounter (Signed)
Called patient with G.I appointment info. Appointment scheduled and confirmed with patient for 04/23/17 @ 9 a.m. With Dr Dawson Bills at [facility].

## 2017-04-18 NOTE — Progress Notes (Deleted)
Office Visit Note  Patient: Jamie Bennett             Date of Birth: Apr 17, 1961           MRN: [id number]             PCP: Lavera Guise, MD Referring: Lavera Guise, MD Visit Date: 05/02/2017 Occupation: @GUAROCC @    Subjective:  No chief complaint on file.   History of Present Illness: Jamie Bennett is a 56 y.o. female ***   Activities of Daily Living:  Patient reports morning stiffness for *** {minute/hour:19697}.   Patient {ACTIONS;DENIES/REPORTS:21021675::"Denies"} nocturnal pain.  Difficulty dressing/grooming: {ACTIONS;DENIES/REPORTS:21021675::"Denies"} Difficulty climbing stairs: {ACTIONS;DENIES/REPORTS:21021675::"Denies"} Difficulty getting out of chair: {ACTIONS;DENIES/REPORTS:21021675::"Denies"} Difficulty using hands for taps, buttons, cutlery, and/or writing: {ACTIONS;DENIES/REPORTS:21021675::"Denies"}   No Rheumatology ROS completed.   PMFS History:  Patient Active Problem List   Diagnosis Date Noted  . Primary osteoarthritis of both knees 01/19/2017  . Suicide attempt ([place]) 07/10/2016  . Fibromyalgia 07/10/2016  . High risk medication use 07/10/2016  . AKI (acute kidney injury) ([place]) 11/09/2015  . Elevated troponin 11/09/2015  . Hypotension 11/09/2015  . Respiratory failure ([place])   . Acute hepatic failure 11/08/2015  . Drug overdose 11/08/2015  . Fatty infiltration of liver 02/16/2015  . Hepatic fibrosis 02/16/2015  . Abnormal serum level of alkaline phosphatase 02/15/2015  . Iron deficiency anemia 02/05/2015  . Vitamin B 12 deficiency 02/05/2015  . OP (osteoporosis) 06/16/2014  . Rheumatoid arteritis 03/02/2014  . Osteoarthritis of left hip 03/02/2014  . Hypothyroidism 03/02/2014  . Rheumatic fever without heart involvement 03/02/2014  . Adult hypothyroidism 03/02/2014  . Arthritis of pelvic region, degenerative 03/02/2014  . Bipolar 1 disorder, depressed ([place]) 02/26/2014  . Bariatric surgery status 11/24/2013  . Affective bipolar  disorder ([place]) 11/24/2013  . Bipolar affective disorder ([place]) 11/24/2013  . Rheumatoid arthritis ([place]) 11/24/2013  . Polysubstance (excluding opioids) dependence ([place]) 09/11/2013  . Polysubstance dependence ([place]) 09/11/2013  . Combined drug dependence excluding opioids ([place]) 09/11/2013  . Arthritis or polyarthritis, rheumatoid ([place]) 09/05/2013  . Leg weakness 09/05/2013    Past Medical History:  Diagnosis Date  . Anemia   . Anxiety   . Bipolar 1 disorder ([place])   . Collagen vascular disease ([place])    rhematoid arthritis  . Constipation   . Fibromyalgia   . Hypotension   . Osteoarthritis   . Osteoporosis   . Rheumatoid arthritis ([place])   . Thyroid disease     Family History  Problem Relation Age of Onset  . Depression Mother   . Dementia Mother   . Heart disease Mother   . Parkinson's disease Father    Past Surgical History:  Procedure Laterality Date  . CHOLECYSTECTOMY    . GASTRIC BYPASS     Social History   Social History Narrative  . No narrative on file     Objective: Vital Signs: There were no vitals taken for this visit.   Physical Exam   Musculoskeletal Exam: ***  CDAI Exam: No CDAI exam completed.    Investigation: No additional findings.PLQ eye exam? CBC Latest Ref Rng & Units 04/12/2017 01/01/2017 11/28/2016  WBC 3.6 - 11.0 K/uL 4.8 6.4 4.6  Hemoglobin 12.0 - 16.0 g/dL 12.3 12.8 12.4  Hematocrit 35.0 - 47.0 % 35.9 37.9 38.0  Platelets 150 - 440 K/uL 227 343 274   CMP Latest Ref Rng & Units 11/28/2016 06/29/2016 11/19/2015  Glucose 65 - 99 mg/dL 88 102(H) 101(H)  BUN  7 - 25 mg/dL 14 13 9   Creatinine 0.50 - 1.05 mg/dL 1.02 0.92 0.62  Sodium 135 - 146 mmol/L 140 139 142  Potassium 3.5 - 5.3 mmol/L 4.6 4.5 3.9  Chloride 98 - 110 mmol/L 107 106 110  CO2 20 - 31 mmol/L 22 25 27   Calcium 8.6 - 10.4 mg/dL 9.0 9.0 8.1(L)  Total Protein 6.1 - 8.1 g/dL 6.6 5.9(L) -  Total Bilirubin 0.2 - 1.2 mg/dL 0.3 0.3 -  Alkaline Phos 33 - 130 U/L 112 107 -  AST 10 -  35 U/L 15 14 -  ALT 6 - 29 U/L 8 18 -    Imaging: No results found.  Speciality Comments: No specialty comments available.    Procedures:  No procedures performed Allergies: Patient has no known allergies.   Assessment / Plan:     Visit Diagnoses: Rheumatoid arthritis involving multiple sites with positive rheumatoid factor (HCC)  High risk medication use - PLQ. eye exam?  Primary osteoarthritis of both hands  Polysubstance dependence ([place])  History of suicide attempt  Fibromyalgia  DDD (degenerative disc disease), cervical  DDD (degenerative disc disease), thoracic  DDD (degenerative disc disease), lumbar  Osteoporosis, unspecified osteoporosis type, unspecified pathological fracture presence - Dr. Jefm Bryant  AKI (acute kidney injury) [facility])  History of gastroesophageal reflux (GERD)  History of depression  History of bipolar disorder  History of anemia  Elevated alkaline phosphatase level    Orders: No orders of the defined types were placed in this encounter.  No orders of the defined types were placed in this encounter.   Face-to-face time spent with patient was *** minutes. 50% of time was spent in counseling and coordination of care.  Follow-Up Instructions: No Follow-up on file.   Bo Merino, MD  Note - This record has been created using Editor, commissioning.  Chart creation errors have been sought, but may not always  have been located. Such creation errors do not reflect on  the standard of medical care.

## 2017-04-23 DIAGNOSIS — Z1211 Encounter for screening for malignant neoplasm of colon: Secondary | ICD-10-CM | POA: Diagnosis not present

## 2017-04-24 DIAGNOSIS — G8929 Other chronic pain: Secondary | ICD-10-CM | POA: Diagnosis not present

## 2017-04-24 DIAGNOSIS — M25551 Pain in right hip: Secondary | ICD-10-CM | POA: Diagnosis not present

## 2017-04-30 ENCOUNTER — Ambulatory Visit: Payer: Medicare HMO | Admitting: Rheumatology

## 2017-05-01 ENCOUNTER — Ambulatory Visit (INDEPENDENT_AMBULATORY_CARE_PROVIDER_SITE_OTHER): Payer: Medicare HMO | Admitting: Psychiatry

## 2017-05-01 ENCOUNTER — Encounter (HOSPITAL_COMMUNITY): Payer: Self-pay | Admitting: Psychiatry

## 2017-05-01 VITALS — BP 134/88 | HR 109 | Ht 65.5 in | Wt 318.6 lb

## 2017-05-01 DIAGNOSIS — M25551 Pain in right hip: Secondary | ICD-10-CM | POA: Diagnosis not present

## 2017-05-01 DIAGNOSIS — Z9884 Bariatric surgery status: Secondary | ICD-10-CM

## 2017-05-01 DIAGNOSIS — M7989 Other specified soft tissue disorders: Secondary | ICD-10-CM | POA: Diagnosis not present

## 2017-05-01 DIAGNOSIS — M069 Rheumatoid arthritis, unspecified: Secondary | ICD-10-CM | POA: Diagnosis not present

## 2017-05-01 DIAGNOSIS — R4581 Low self-esteem: Secondary | ICD-10-CM

## 2017-05-01 DIAGNOSIS — M549 Dorsalgia, unspecified: Secondary | ICD-10-CM

## 2017-05-01 DIAGNOSIS — Z818 Family history of other mental and behavioral disorders: Secondary | ICD-10-CM | POA: Diagnosis not present

## 2017-05-01 DIAGNOSIS — Z7289 Other problems related to lifestyle: Secondary | ICD-10-CM

## 2017-05-01 DIAGNOSIS — R69 Illness, unspecified: Secondary | ICD-10-CM | POA: Diagnosis not present

## 2017-05-01 DIAGNOSIS — F314 Bipolar disorder, current episode depressed, severe, without psychotic features: Secondary | ICD-10-CM | POA: Diagnosis not present

## 2017-05-01 DIAGNOSIS — F603 Borderline personality disorder: Secondary | ICD-10-CM

## 2017-05-01 DIAGNOSIS — Z810 Family history of intellectual disabilities: Secondary | ICD-10-CM | POA: Diagnosis not present

## 2017-05-01 DIAGNOSIS — G4700 Insomnia, unspecified: Secondary | ICD-10-CM

## 2017-05-01 DIAGNOSIS — G8929 Other chronic pain: Secondary | ICD-10-CM | POA: Diagnosis not present

## 2017-05-01 DIAGNOSIS — F419 Anxiety disorder, unspecified: Secondary | ICD-10-CM

## 2017-05-01 DIAGNOSIS — F332 Major depressive disorder, recurrent severe without psychotic features: Secondary | ICD-10-CM

## 2017-05-01 DIAGNOSIS — R45851 Suicidal ideations: Secondary | ICD-10-CM | POA: Diagnosis not present

## 2017-05-01 DIAGNOSIS — R450 Nervousness: Secondary | ICD-10-CM

## 2017-05-01 MED ORDER — LAMOTRIGINE 150 MG PO TABS: 150.0000 mg | ORAL_TABLET | Freq: Two times a day (BID) | ORAL | 1 refills | Status: DC

## 2017-05-01 MED ORDER — VENLAFAXINE HCL 75 MG PO TABS: 225.0000 mg | ORAL_TABLET | Freq: Every morning | ORAL | 1 refills | Status: DC

## 2017-05-01 MED ORDER — BUSPIRONE HCL 10 MG PO TABS: 10.0000 mg | ORAL_TABLET | Freq: Three times a day (TID) | ORAL | 1 refills | Status: DC

## 2017-05-01 MED ORDER — TRAZODONE HCL 100 MG PO TABS: ORAL_TABLET | ORAL | 1 refills | Status: DC

## 2017-05-01 NOTE — Progress Notes (Signed)
BH MD/PA/NP OP Progress Note  05/01/2017 2:19 PM Jamie Bennett  MRN:  [id number]  Chief Complaint: transfer of care, depression  HPI: Jamie Bennett is a 56 year old female with a psychiatric history of major depressive disorder, rheumatoid arthritis, bipolar 1, polysubstance dependence in remission, status post bariatric surgery, history of self-injurious behaviors and cutting, and borderline personality.  She presents today to establish psychiatric care, as a transfer from Dr. Einar Grad.  The patient recently participated in the partial hospitalization program in this office, and reports that she was able to take away some coping strategies. Spent time with the patient learning about her childhood upbringing, and her nearly 16 year marriage to her husband. She reports that they've been together since age 53, and he is the only individual she has been with in any romantic way. She reports that they have a mid 21s son together, and a grandson. She reports that their marriage has had ups and downs, but the major stressor for her has been related to her poor self-esteem, and this is complicated by her husband's controlling demeanor.  She spent ample time focused on external locus of control, and I redirected her back to the reality that she is only able to work on herself and her mood symptoms, and I'm happy to support her in this.    She reports that she continues to struggle with poor self-esteem, depression, passive suicidal thoughts, and cutting as recently as 3 months ago. She reports that she is often worried and anxious, she has trouble sleeping without her trazodone. She has poor energy and concentration. She has low motivation to engage in healthful and hedonic activities.  I spent time with her reviewing her 6 psychiatric hospitalizations, and prior overdose on Provigil. She reports that she has never had any significant relief from her depressive symptoms. She currently continues on Effexor,  BuSpar, Lamictal, trazodone.  Given the chronicity and severity of her depression symptoms, I spent time with her reviewing the risks and benefits of ECT, and expressing my recommendation that she proceed with a consultation for ECT treatment at our [place] office. She was agreeable to this recommendation, and agreeable to follow-up with this writer in 3 months for medication management.    Visit Diagnosis:    ICD-10-CM   1. Severe recurrent major depression without psychotic features ([place]) F33.2   2. Borderline personality disorder ([place]) F60.3   3. Severe bipolar I disorder, current or most recent episode depressed ([place]) F31.4   4. Deliberate self-cutting Z72.89   5. Suicidal ideations R45.851     Past Psychiatric History: Patient has 6 prior psychiatric hospitalizations, and describes a lifelong history of depression and mood lability, cutting behaviors over the past 10 years. She also has multiple overdoses on medications, with 1 major hospitalization due to medical sequelae approximately 18 months ago  Past Medical History:  Past Medical History:  Diagnosis Date  . Anemia   . Anxiety   . Bipolar 1 disorder ([place])   . Collagen vascular disease ([place])    rhematoid arthritis  . Constipation   . Fibromyalgia   . Hypotension   . Osteoarthritis   . Osteoporosis   . Rheumatoid arthritis ([place])   . Thyroid disease     Past Surgical History:  Procedure Laterality Date  . CHOLECYSTECTOMY    . GASTRIC BYPASS      Family Psychiatric History: She has a family history of depression and Parkinson's disease  Family History:  Family History  Problem  Relation Age of Onset  . Depression Mother   . Dementia Mother   . Heart disease Mother   . Parkinson's disease Father     Social History:  Social History   Social History  . Marital status: Married    Spouse name: N/A  . Number of children: N/A  . Years of education: N/A   Social History Main Topics  . Smoking status: Never  Smoker  . Smokeless tobacco: Never Used  . Alcohol use No  . Drug use: No  . Sexual activity: No   Other Topics Concern  . None   Social History Narrative  . None    Allergies: No Known Allergies  Metabolic Disorder Labs: No results found for: HGBA1C, MPG No results found for: PROLACTIN Lab Results  Component Value Date   TRIG 176 (H) 11/09/2015   Lab Results  Component Value Date   TSH 0.147 (L) 11/11/2015   TSH 0.354 (L) 01/28/2014    Therapeutic Level Labs: No results found for: LITHIUM No results found for: VALPROATE No components found for:  CBMZ  Current Medications: Current Outpatient Prescriptions  Medication Sig Dispense Refill  . aspirin EC 81 MG tablet Take 81 mg by mouth daily.    . busPIRone (BUSPAR) 10 MG tablet Take 1 tablet (10 mg total) by mouth 3 (three) times daily. 90 tablet 1  . Cholecalciferol (HM VITAMIN D3) 4000 units CAPS Take 4,000 capsules by mouth daily.    . Coenzyme Q-10 200 MG CAPS Take 200 mg by mouth daily.    . cyanocobalamin (,VITAMIN B-12,) 1000 MCG/ML injection Inject 1,000 mcg into the muscle every 30 (thirty) days.    Marland Kitchen esomeprazole (NEXIUM) 40 MG capsule Take 1 capsule (40 mg total) by mouth daily at 12 noon.    . Fiber, Guar Gum, CHEW Chew 10 mg by mouth daily.    Marland Kitchen gabapentin (NEURONTIN) 300 MG capsule Take by mouth.    . hydroxychloroquine (PLAQUENIL) 200 MG tablet Take 1 tablet by mouth twice a day Monday through Friday 40 tablet 0  . hydrOXYzine (ATARAX/VISTARIL) 50 MG tablet Take 1 tablet (50 mg total) by mouth 2 (two) times daily as needed. 60 tablet 1  . ibuprofen (ADVIL,MOTRIN) 600 MG tablet Take 1 tablet (600 mg total) by mouth every 8 (eight) hours as needed. 30 tablet 0  . Krill Oil 500 MG CAPS Take 500 mg by mouth daily.    Marland Kitchen lamoTRIgine (LAMICTAL) 150 MG tablet Take 1 tablet (150 mg total) by mouth 2 (two) times daily. 60 tablet 1  . levothyroxine (SYNTHROID, LEVOTHROID) 112 MCG tablet Take 112 mcg by mouth daily.     Marland Kitchen loratadine (CLARITIN) 10 MG tablet 10 mg  prn as needed    . Magnesium 400 MG CAPS Take 400 mg by mouth 2 (two) times daily.     . Multiple Vitamin (MULTIVITAMIN WITH MINERALS) TABS tablet Take 1 tablet by mouth daily.    Marland Kitchen tiZANidine (ZANAFLEX) 4 MG tablet Take 4 mg by mouth 2 (two) times daily as needed.     . traZODone (DESYREL) 100 MG tablet Take 2 tablet at night as needed for insomnia 60 tablet 1  . venlafaxine (EFFEXOR) 75 MG tablet Take 3 tablets (225 mg total) by mouth every morning. 90 tablet 1  . zoledronic acid (RECLAST) 5 MG/100ML SOLN injection Inject 5 mg into the vein See admin instructions. Patient takes yearly    . fluticasone (FLONASE) 50 MCG/ACT nasal spray Place 1  spray into both nostrils daily.     . methocarbamol (ROBAXIN) 500 MG tablet 1 at 7 AM when necessary and 1 at 2 PM PRN (Patient not taking: Reported on 05/01/2017) 60 tablet 3  . triamcinolone cream (KENALOG) 0.5 %      No current facility-administered medications for this visit.      Musculoskeletal: Strength & Muscle Tone: within normal limits Gait & Station: shuffle Patient leans: N/A  Psychiatric Specialty Exam: Review of Systems  Constitutional: Negative.   HENT: Negative.   Respiratory: Negative.   Cardiovascular: Positive for leg swelling.  Gastrointestinal: Negative.   Musculoskeletal: Positive for back pain.  Neurological: Negative.   Psychiatric/Behavioral: Positive for depression and suicidal ideas. The patient is nervous/anxious and has insomnia.     Blood pressure 134/88, pulse (!) 109, height 5' 5.5" (1.664 m), weight (!) 318 lb 9.6 oz (144.5 kg).Body mass index is 52.21 kg/m.  General Appearance: Casual and Fairly Groomed  Eye Contact:  Good  Speech:  Clear and Coherent  Volume:  Normal  Mood:  Euthymic  Affect:  Non-Congruent reports that she is depressed but presents as euthymic  Thought Process:  Coherent and Goal Directed  Orientation:  Full (Time, Place, and Person)   Thought Content: Logical   Suicidal Thoughts:  Yes.  without intent/plan  Homicidal Thoughts:  No  Memory:  Immediate;   Fair  Judgement:  Fair  Insight:  Shallow  Psychomotor Activity:  Normal  Concentration:  Concentration: Fair  Recall:  Good  Fund of Knowledge: Good  Language: Good  Akathisia:  Negative  Handed:  Right  AIMS (if indicated): not done  Assets:  Communication Skills Desire for Improvement Financial Resources/Insurance Housing Transportation  ADL's:  Intact  Cognition: WNL  Sleep:  Fair   Screenings: AUDIT     Admission (Discharged) from 02/26/2014 in [facility]  Alcohol Use Disorder Identification Test Final Score (AUDIT)  0    GAD-7     Counselor from 03/08/2017 in [facility] Counselor from 02/26/2017 in [facility]  Total GAD-7 Score  7  8    PHQ2-9     Counselor from 03/08/2017 in [facility] Counselor from 02/26/2017 in [place]  PHQ-2 Total Score  5  6  PHQ-9 Total Score  16  21       Assessment and Plan: Jamie Bennett is a 56 year old female with a psychiatric history most consistent with major depressive disorder and borderline personality disorder. She has multiple prior overdoses and suicide attempts, and has had 6 psychiatric hospitalizations. She has a generally external locus of control, and chronic mood lability and face of stressors. She attributes much of her distress to marital issues, but is able to also acknowledges that she continues to struggle with an intrinsic and biological depression, accompanied by poor self-esteem and pessimism. She is chronically elevated risk for suicide.  She denies any acute suicidality. Given the chronicity of her illness and the severity of her symptoms, I believe she would be appropriate for an ECT consultation, and she  is agreeable to this recommendation. Follow-up in 3 months or sooner if needed. I'd like to support her and establishing individual therapy and/or DBT care after she receives ECT treatment.  1. Severe recurrent major depression without psychotic features ([place])   2. Borderline personality disorder ([place])   3. Severe bipolar I disorder, current or most recent episode  depressed ([place])   4. Deliberate self-cutting   5. Suicidal ideations    Continue Lamictal 150 mg twice daily Continue Effexor 225 mg immediate release daily Continue BuSpar 10 mg 3 times daily Continue trazodone 100 mg nightly Referral for ECT consultation  I spent 45 minutes with the patient in direct care and counseling. We discussed her past psychiatric history, social upbringing, marital stressors, and past response to medication interventions. She was receptive to recommendations as discussed      Aundra Dubin, MD 05/01/2017, 2:19 PM

## 2017-05-02 ENCOUNTER — Encounter: Payer: Self-pay | Admitting: Rheumatology

## 2017-05-02 ENCOUNTER — Ambulatory Visit: Payer: Medicare HMO | Admitting: Rheumatology

## 2017-05-02 ENCOUNTER — Ambulatory Visit (INDEPENDENT_AMBULATORY_CARE_PROVIDER_SITE_OTHER): Payer: Medicare HMO | Admitting: Rheumatology

## 2017-05-02 VITALS — BP 102/76 | HR 87 | Resp 18 | Ht 66.0 in | Wt 321.0 lb

## 2017-05-02 DIAGNOSIS — M62838 Other muscle spasm: Secondary | ICD-10-CM

## 2017-05-02 DIAGNOSIS — M797 Fibromyalgia: Secondary | ICD-10-CM

## 2017-05-02 DIAGNOSIS — Z79899 Other long term (current) drug therapy: Secondary | ICD-10-CM

## 2017-05-02 DIAGNOSIS — M059 Rheumatoid arthritis with rheumatoid factor, unspecified: Secondary | ICD-10-CM | POA: Diagnosis not present

## 2017-05-02 MED ORDER — HYDROXYCHLOROQUINE SULFATE 200 MG PO TABS: ORAL_TABLET | ORAL | 0 refills | Status: DC

## 2017-05-02 MED ORDER — LIDOCAINE HCL 1 % IJ SOLN
0.3000 mL | INTRAMUSCULAR | Status: AC | PRN
  Administered 2017-05-02: .3 mL

## 2017-05-02 MED ORDER — TRIAMCINOLONE ACETONIDE 40 MG/ML IJ SUSP
10.0000 mg | INTRAMUSCULAR | Status: AC | PRN
  Administered 2017-05-02: 10 mg via INTRAMUSCULAR

## 2017-05-02 NOTE — Progress Notes (Signed)
Office Visit Note  Patient: Jamie Bennett             Date of Birth: 02/14/1961           MRN: [id number]             PCP: Lavera Guise, MD Referring: Lavera Guise, MD Visit Date: 05/02/2017 Occupation: '@[email or web address]' @    Subjective:  Rheumatoid Arthritis   History of Present Illness: Jamie Bennett is a 56 y.o. female  With a history of rheumatoid arthritis that is well controlled with Plaquenil 200 mg twice a day Monday through Friday.  Patient's fibromyalgia is flaring. She rates her discomfort as 8-9 on a scale of 0-10. Her trapezius muscles are painful. Patient likes water aerobics and we'll restart that soon.   She likes to walk. The Euflex injection that were completed in July 2018 are doing well.Her left knee is doing better than e right knee. I advised the patient that she can do knee injections again after applying in January 2019. Patient is agreeable and will call at that time  Activities of Daily Living:  Patient reports morning stiffness for 30 minutes.   Patient Reports nocturnal pain.  Difficulty dressing/grooming: Reports Difficulty climbing stairs: Reports Difficulty getting out of chair: Reports Difficulty using hands for taps, buttons, cutlery, and/or writing: Reports   Review of Systems  Constitutional: Positive for fatigue.  HENT: Negative for mouth sores and mouth dryness.   Eyes: Negative for dryness.  Respiratory: Negative for shortness of breath.   Gastrointestinal: Negative for constipation and diarrhea.  Musculoskeletal: Positive for myalgias and myalgias.  Skin: Negative for sensitivity to sunlight.  Psychiatric/Behavioral: Positive for sleep disturbance. Negative for decreased concentration.    PMFS History:  Patient Active Problem List   Diagnosis Date Noted  . Primary osteoarthritis of both knees 01/19/2017  . Suicide attempt ([place]) 07/10/2016  . Fibromyalgia 07/10/2016  . High risk medication use 07/10/2016  . AKI (acute  kidney injury) ([place]) 11/09/2015  . Elevated troponin 11/09/2015  . Hypotension 11/09/2015  . Respiratory failure ([place])   . Acute hepatic failure 11/08/2015  . Drug overdose 11/08/2015  . Fatty infiltration of liver 02/16/2015  . Hepatic fibrosis 02/16/2015  . Abnormal serum level of alkaline phosphatase 02/15/2015  . Iron deficiency anemia 02/05/2015  . Vitamin B 12 deficiency 02/05/2015  . OP (osteoporosis) 06/16/2014  . Rheumatoid arteritis 03/02/2014  . Osteoarthritis of left hip 03/02/2014  . Hypothyroidism 03/02/2014  . Rheumatic fever without heart involvement 03/02/2014  . Adult hypothyroidism 03/02/2014  . Arthritis of pelvic region, degenerative 03/02/2014  . Bipolar 1 disorder, depressed ([place]) 02/26/2014  . Bariatric surgery status 11/24/2013  . Affective bipolar disorder ([place]) 11/24/2013  . Bipolar affective disorder ([place]) 11/24/2013  . Rheumatoid arthritis ([place]) 11/24/2013  . Polysubstance (excluding opioids) dependence ([place]) 09/11/2013  . Polysubstance dependence ([place]) 09/11/2013  . Combined drug dependence excluding opioids ([place]) 09/11/2013  . Arthritis or polyarthritis, rheumatoid ([place]) 09/05/2013  . Leg weakness 09/05/2013    Past Medical History:  Diagnosis Date  . Anemia   . Anxiety   . Bipolar 1 disorder ([place])   . Collagen vascular disease ([place])    rhematoid arthritis  . Constipation   . Fibromyalgia   . Hypotension   . Osteoarthritis   . Osteoporosis   . Rheumatoid arthritis ([place])   . Thyroid disease     Family History  Problem Relation Age of Onset  . Depression Mother   .  Dementia Mother   . Heart disease Mother   . Parkinson's disease Father    Past Surgical History:  Procedure Laterality Date  . CHOLECYSTECTOMY    . GASTRIC BYPASS     Social History   Social History Narrative  . No narrative on file     Objective: Vital Signs: BP 102/76 (BP Location: Left Arm, Patient Position: Sitting, Cuff Size: Normal)   Pulse 87   Resp 18    Ht '5\' 6"'  (1.676 m)   Wt (!) 321 lb (145.6 kg)   BMI 51.81 kg/m    Physical Exam  Constitutional: She is oriented to person, place, and time. She appears well-developed and well-nourished.  HENT:  Head: Normocephalic and atraumatic.  Eyes: Pupils are equal, round, and reactive to light. EOM are normal.  Cardiovascular: Normal rate, regular rhythm and normal heart sounds.  Exam reveals no gallop and no friction rub.   No murmur heard. Pulmonary/Chest: Effort normal and breath sounds normal. She has no wheezes. She has no rales.  Abdominal: Soft. Bowel sounds are normal. She exhibits no distension. There is no tenderness. There is no guarding. No hernia.  Musculoskeletal: Normal range of motion. She exhibits no edema, tenderness or deformity.  Lymphadenopathy:    She has no cervical adenopathy.  Neurological: She is alert and oriented to person, place, and time. Coordination normal.  Skin: Skin is warm and dry. Capillary refill takes less than 2 seconds. No rash noted.  Psychiatric: She has a normal mood and affect. Her behavior is normal.  Nursing note and vitals reviewed.    Musculoskeletal Exam:  Full range of motion of all joints Grip strength is equal and strong bilaterally Fiber myalgia tender points are all present 18 out of 18  CDAI Exam: No CDAI exam completed.  no synovitis   Investigation: No additional findings. Appointment on 04/12/2017  Component Date Value Ref Range Status  . WBC 04/12/2017 4.8  3.6 - 11.0 K/uL Final  . RBC 04/12/2017 4.04  3.80 - 5.20 MIL/uL Final  . Hemoglobin 04/12/2017 12.3  12.0 - 16.0 g/dL Final  . HCT 04/12/2017 35.9  35.0 - 47.0 % Final  . MCV 04/12/2017 88.8  80.0 - 100.0 fL Final  . MCH 04/12/2017 30.6  26.0 - 34.0 pg Final  . MCHC 04/12/2017 34.4  32.0 - 36.0 g/dL Final  . RDW 04/12/2017 13.5  11.5 - 14.5 % Final  . Platelets 04/12/2017 227  150 - 440 K/uL Final  . Neutrophils Relative % 04/12/2017 47  % Final  . Neutro Abs  04/12/2017 2.3  1.4 - 6.5 K/uL Final  . Lymphocytes Relative 04/12/2017 40  % Final  . Lymphs Abs 04/12/2017 1.9  1.0 - 3.6 K/uL Final  . Monocytes Relative 04/12/2017 8  % Final  . Monocytes Absolute 04/12/2017 0.4  0.2 - 0.9 K/uL Final  . Eosinophils Relative 04/12/2017 5  % Final  . Eosinophils Absolute 04/12/2017 0.2  0 - 0.7 K/uL Final  . Basophils Relative 04/12/2017 0  % Final  . Basophils Absolute 04/12/2017 0.0  0 - 0.1 K/uL Final  . Ferritin 04/12/2017 28  11 - 307 ng/mL Final  . Iron 04/12/2017 80  28 - 170 ug/dL Final  . TIBC 04/12/2017 412  250 - 450 ug/dL Final  . Saturation Ratios 04/12/2017 19  10.4 - 31.8 % Final  . UIBC 04/12/2017 332  ug/dL Final  . Folate 04/12/2017 34.0  >5.9 ng/mL Final  Appointment on 01/01/2017  Component Date Value Ref Range Status  . WBC 01/01/2017 6.4  3.6 - 11.0 K/uL Final  . RBC 01/01/2017 4.19  3.80 - 5.20 MIL/uL Final  . Hemoglobin 01/01/2017 12.8  12.0 - 16.0 g/dL Final  . HCT 01/01/2017 37.9  35.0 - 47.0 % Final  . MCV 01/01/2017 90.4  80.0 - 100.0 fL Final  . MCH 01/01/2017 30.6  26.0 - 34.0 pg Final  . MCHC 01/01/2017 33.9  32.0 - 36.0 g/dL Final  . RDW 01/01/2017 13.8  11.5 - 14.5 % Final  . Platelets 01/01/2017 343  150 - 440 K/uL Final  . Neutrophils Relative % 01/01/2017 47  % Final  . Neutro Abs 01/01/2017 3.0  1.4 - 6.5 K/uL Final  . Lymphocytes Relative 01/01/2017 38  % Final  . Lymphs Abs 01/01/2017 2.4  1.0 - 3.6 K/uL Final  . Monocytes Relative 01/01/2017 12  % Final  . Monocytes Absolute 01/01/2017 0.8  0.2 - 0.9 K/uL Final  . Eosinophils Relative 01/01/2017 3  % Final  . Eosinophils Absolute 01/01/2017 0.2  0 - 0.7 K/uL Final  . Basophils Relative 01/01/2017 0  % Final  . Basophils Absolute 01/01/2017 0.0  0 - 0.1 K/uL Final  . Ferritin 01/01/2017 37  11 - 307 ng/mL Final  Office Visit on 11/28/2016  Component Date Value Ref Range Status  . WBC 11/28/2016 4.6  3.8 - 10.8 K/uL Final  . RBC 11/28/2016 4.10  3.80 -  5.10 MIL/uL Final  . Hemoglobin 11/28/2016 12.4  11.7 - 15.5 g/dL Final  . HCT 11/28/2016 38.0  35.0 - 45.0 % Final  . MCV 11/28/2016 92.7  80.0 - 100.0 fL Final  . MCH 11/28/2016 30.2  27.0 - 33.0 pg Final  . MCHC 11/28/2016 32.6  32.0 - 36.0 g/dL Final  . RDW 11/28/2016 13.4  11.0 - 15.0 % Final  . Platelets 11/28/2016 274  140 - 400 K/uL Final  . MPV 11/28/2016 9.3  7.5 - 12.5 fL Final  . Neutro Abs 11/28/2016 2438  1,500 - 7,800 cells/uL Final  . Lymphs Abs 11/28/2016 1564  850 - 3,900 cells/uL Final  . Monocytes Absolute 11/28/2016 460  200 - 950 cells/uL Final  . Eosinophils Absolute 11/28/2016 138  15 - 500 cells/uL Final  . Basophils Absolute 11/28/2016 0  0 - 200 cells/uL Final  . Neutrophils Relative % 11/28/2016 53  % Final  . Lymphocytes Relative 11/28/2016 34  % Final  . Monocytes Relative 11/28/2016 10  % Final  . Eosinophils Relative 11/28/2016 3  % Final  . Basophils Relative 11/28/2016 0  % Final  . Smear Review 11/28/2016 Criteria for review not met   Final  . Sodium 11/28/2016 140  135 - 146 mmol/L Final  . Potassium 11/28/2016 4.6  3.5 - 5.3 mmol/L Final  . Chloride 11/28/2016 107  98 - 110 mmol/L Final  . CO2 11/28/2016 22  20 - 31 mmol/L Final  . Glucose, Bld 11/28/2016 88  65 - 99 mg/dL Final  . BUN 11/28/2016 14  7 - 25 mg/dL Final  . Creat 11/28/2016 1.02  0.50 - 1.05 mg/dL Final   Comment:   For patients > or = 57 years of age: The upper reference limit for Creatinine is approximately 13% higher for people identified as African-American.     . Total Bilirubin 11/28/2016 0.3  0.2 - 1.2 mg/dL Final  . Alkaline Phosphatase 11/28/2016 112  33 - 130 U/L Final  .  AST 11/28/2016 15  10 - 35 U/L Final  . ALT 11/28/2016 8  6 - 29 U/L Final  . Total Protein 11/28/2016 6.6  6.1 - 8.1 g/dL Final  . Albumin 11/28/2016 3.9  3.6 - 5.1 g/dL Final  . Calcium 11/28/2016 9.0  8.6 - 10.4 mg/dL Final  . GFR, Est African American 11/28/2016 71  >=60 mL/min Final  . GFR,  Est Non African American 11/28/2016 62  >=60 mL/min Final     Imaging: No results found.  Speciality Comments: No specialty comments available.    Procedures:  Trigger Point Inj Date/Time: 05/02/2017 2:08 PM Performed by: Eliezer Lofts Authorized by: Eliezer Lofts   Consent Given by:  Patient Total # of Trigger Points:  2 Location: neck   Medications #1:  0.3 mL lidocaine 1 %; 10 mg triamcinolone acetonide 40 MG/ML Medications #2:  0.3 mL lidocaine 1 %; 10 mg triamcinolone acetonide 40 MG/ML Comments: Bilateral trapezius muscle spasms Patient rates her discomfort as 8-9 on a scale of 0-10  When she is flaring, it affects her activity sleep also.    Allergies: Lactulose   Assessment / Plan:     Visit Diagnoses: Fibromyalgia  Rheumatoid arthritis with positive rheumatoid factor, involving unspecified site (HCC)  High risk medications (not anticoagulants) long-term use  Trapezius muscle spasm    pLan: #1: Rheumatoid arthritis. Well-controlled with Plaquenil 200 mg Monday through Friday. Plaquenil eye exam was done a month ago. Unfortunately, we do not have the results yet. Patient t doctor to send it to our office. She will go to their office and have them fill out another one and she will personally bring it by 2 is. In the meanwhile, I will refill her Plaquenil for 90 days but will not do a refill on that unless we get the Plaquenil documented in her chart. Patient understands that and is agreeable to  #2: High risk prescription Plaquenil 200 twice a day Monday through Friday only. We will do a refill for 90 days  #3: Fibromyalgia syndrome 18 out of 18 tender points  #4: Bilateral trapezius muscle spasm See procedure notes. They were injected with 10 mg of Kenalog mixed with 0.3 and also 1% lidocaine Patient's pain prior to the procedure was a 10 9 on a scale of 0-10. Patient states that her pain was a 5 on a scale of 0-10 couple minutes after the  injections  #5:return to clinic in 6  #6:Patient will bring back Plaquenil eye exam form filled out. (The procedure was orally done a month ago and patient states it was normal. Unfortunately we did not get a opy of that for record and patient will rectify that by bringing in a signed form.) We will not be able to refill the next Plaquenil until patient brings in this documentation. I've asked her to do thatbetween now and the next 90 days   Orders: Orders Placed This Encounter  Procedures  . Trigger Point Injection   Meds ordered this encounter  Medications  . hydroxychloroquine (PLAQUENIL) 200 MG tablet    Sig: Take 1 tablet by mouth twice a day Monday through Friday    Dispense:  120 tablet    Refill:  0    Refill [id number]    Order Specific Question:   Supervising Provider    Answer:   Bo Merino [[id number]]    Face-to-face time spent with patient was 30 minutes. 50% of time was spent in counseling and coordination of care.  Follow-Up Instructions: Return in about 5 months (around 09/30/2017) for FMS, FATIGUE,INSOMNIA,bil trap musc spasm,.   Eliezer Lofts, PA-C  Note - This record has been created using [employer].  Chart creation errors have been sought, but may not always  have been located. Such creation errors do not reflect on  the standard of medical care.

## 2017-05-03 ENCOUNTER — Telehealth (HOSPITAL_COMMUNITY): Payer: Self-pay

## 2017-05-03 ENCOUNTER — Other Ambulatory Visit (HOSPITAL_COMMUNITY): Payer: Self-pay | Admitting: Psychiatry

## 2017-05-03 DIAGNOSIS — F332 Major depressive disorder, recurrent severe without psychotic features: Secondary | ICD-10-CM

## 2017-05-03 NOTE — Telephone Encounter (Signed)
Thank you, I called patient to let her know. I also called [facility] and [facility] said this is something you are going to have to do for every patient now as they are tracking referrals.

## 2017-05-03 NOTE — Telephone Encounter (Signed)
Oooo thank you!

## 2017-05-03 NOTE — Telephone Encounter (Signed)
I dont typically need to put one in for internal referrals for ECT - I just give the patient the clinic number to schedule a consult.  I went ahead and did it. Perhaps its her insurance?

## 2017-05-03 NOTE — Telephone Encounter (Signed)
Patient is calling because she is trying to schedule with Dr. Joretta Bachelor and they do not have a referral, could you please put it in and let me know? Patient would like a call back when it is done.

## 2017-05-08 DIAGNOSIS — G8929 Other chronic pain: Secondary | ICD-10-CM | POA: Diagnosis not present

## 2017-05-08 DIAGNOSIS — M25551 Pain in right hip: Secondary | ICD-10-CM | POA: Diagnosis not present

## 2017-05-10 ENCOUNTER — Ambulatory Visit (INDEPENDENT_AMBULATORY_CARE_PROVIDER_SITE_OTHER): Payer: Medicare HMO | Admitting: Psychiatry

## 2017-05-10 ENCOUNTER — Encounter: Payer: Self-pay | Admitting: Psychiatry

## 2017-05-10 VITALS — BP 127/83 | HR 89 | Temp 98.9°F | Wt 324.6 lb

## 2017-05-10 DIAGNOSIS — F332 Major depressive disorder, recurrent severe without psychotic features: Secondary | ICD-10-CM | POA: Diagnosis not present

## 2017-05-10 DIAGNOSIS — R69 Illness, unspecified: Secondary | ICD-10-CM | POA: Diagnosis not present

## 2017-05-11 ENCOUNTER — Inpatient Hospital Stay: Payer: Medicare HMO | Attending: Hematology and Oncology

## 2017-05-11 ENCOUNTER — Ambulatory Visit
Admission: RE | Admit: 2017-05-11 | Discharge: 2017-05-11 | Disposition: A | Discharge status: Home / Self Care | Payer: Medicare HMO | Source: Ambulatory Visit | Attending: Psychiatry | Admitting: Psychiatry

## 2017-05-11 ENCOUNTER — Encounter
Admission: RE | Admit: 2017-05-11 | Discharge: 2017-05-11 | Disposition: A | Discharge status: Home / Self Care | Payer: Medicare HMO | Source: Ambulatory Visit | Attending: Psychiatry | Admitting: Psychiatry

## 2017-05-11 DIAGNOSIS — Z79899 Other long term (current) drug therapy: Secondary | ICD-10-CM | POA: Insufficient documentation

## 2017-05-11 DIAGNOSIS — Z01810 Encounter for preprocedural cardiovascular examination: Secondary | ICD-10-CM | POA: Insufficient documentation

## 2017-05-11 DIAGNOSIS — Z419 Encounter for procedure for purposes other than remedying health state, unspecified: Secondary | ICD-10-CM

## 2017-05-11 DIAGNOSIS — E538 Deficiency of other specified B group vitamins: Secondary | ICD-10-CM

## 2017-05-11 DIAGNOSIS — Z01812 Encounter for preprocedural laboratory examination: Secondary | ICD-10-CM | POA: Insufficient documentation

## 2017-05-11 DIAGNOSIS — D509 Iron deficiency anemia, unspecified: Secondary | ICD-10-CM | POA: Insufficient documentation

## 2017-05-11 DIAGNOSIS — R918 Other nonspecific abnormal finding of lung field: Secondary | ICD-10-CM | POA: Diagnosis not present

## 2017-05-11 DIAGNOSIS — Z01818 Encounter for other preprocedural examination: Secondary | ICD-10-CM | POA: Insufficient documentation

## 2017-05-11 DIAGNOSIS — G4089 Other seizures: Secondary | ICD-10-CM | POA: Diagnosis not present

## 2017-05-11 HISTORY — DX: Other specified postprocedural states: Z98.890

## 2017-05-11 HISTORY — DX: Hypothyroidism, unspecified: E03.9

## 2017-05-11 HISTORY — DX: Other specified postprocedural states: R11.2

## 2017-05-11 LAB — BASIC METABOLIC PANEL
Anion gap: 8 (ref 5–15)
BUN: 16 mg/dL (ref 6–20)
CO2: 21 mmol/L — ABNORMAL LOW (ref 22–32)
Calcium: 9.1 mg/dL (ref 8.9–10.3)
Chloride: 111 mmol/L (ref 101–111)
Creatinine, Ser: 0.93 mg/dL (ref 0.44–1.00)
GFR calc Af Amer: >60 mL/min (ref >60)
GFR calc non Af Amer: >60 mL/min (ref >60)
Glucose, Bld: 87 mg/dL (ref 65–99)
Potassium: 4 mmol/L (ref 3.5–5.1)
Sodium: 140 mmol/L (ref 135–145)

## 2017-05-11 LAB — CBC
HCT: 37 % (ref 35.0–47.0)
Hemoglobin: 12.8 g/dL (ref 12.0–16.0)
MCH: 30.5 pg (ref 26.0–34.0)
MCHC: 34.6 g/dL (ref 32.0–36.0)
MCV: 88.2 fL (ref 80.0–100.0)
Platelets: 237 10*3/uL (ref 150–440)
RBC: 4.2 MIL/uL (ref 3.80–5.20)
RDW: 13.7 % (ref 11.5–14.5)
WBC: 5.5 10*3/uL (ref 3.6–11.0)

## 2017-05-11 LAB — URINALYSIS, ROUTINE W REFLEX MICROSCOPIC
Bilirubin Urine: NEGATIVE
Glucose, UA: NEGATIVE mg/dL
Hgb urine dipstick: NEGATIVE
Ketones, ur: NEGATIVE mg/dL
Leukocytes, UA: NEGATIVE
Nitrite: NEGATIVE
Protein, ur: NEGATIVE mg/dL
Specific Gravity, Urine: 1.011 (ref 1.005–1.030)
pH: 6 (ref 5.0–8.0)

## 2017-05-11 MED ORDER — CYANOCOBALAMIN 1000 MCG/ML IJ SOLN
1000.0000 ug | Freq: Once | INTRAMUSCULAR | Status: AC
  Administered 2017-05-11: 1000 ug via INTRAMUSCULAR

## 2017-05-15 DIAGNOSIS — M064 Inflammatory polyarthropathy: Secondary | ICD-10-CM | POA: Diagnosis not present

## 2017-05-15 DIAGNOSIS — M810 Age-related osteoporosis without current pathological fracture: Secondary | ICD-10-CM | POA: Diagnosis not present

## 2017-05-15 DIAGNOSIS — R69 Illness, unspecified: Secondary | ICD-10-CM | POA: Diagnosis not present

## 2017-05-15 DIAGNOSIS — Z0001 Encounter for general adult medical examination with abnormal findings: Secondary | ICD-10-CM | POA: Diagnosis not present

## 2017-05-15 DIAGNOSIS — E039 Hypothyroidism, unspecified: Secondary | ICD-10-CM | POA: Diagnosis not present

## 2017-05-15 DIAGNOSIS — Z23 Encounter for immunization: Secondary | ICD-10-CM | POA: Diagnosis not present

## 2017-05-15 NOTE — Progress Notes (Signed)
ECT: This is a appointment to establish care and discuss possibility of ECT treatment with this 56 year old woman referred by her outpatient psychiatrist. Patient interviewed chart reviewed. Patient known to me from previous encounters as well. Patient reports she continues to struggle with depression. Mood stays down and depressed and hopeless almost all of the time. Energy level chronically low. Sleep poor. No changed appetite. Patient feels she has almost nothing in her life that she enjoys. She denies having any current psychotic symptoms. She has no intention of plan of self-harm although she has had some hopeless suicidal thoughts intermittently over the years. She is currently seeing an outpatient psychiatrist and is treated with antidepressant medication. She has been referred to consider ECT because of poor response to other treatment.  Social history: Patient is married lives with her husband. Long-standing relationship but a chronic feeling of poor support. Not working outside the home.  Medical history: Multiple medical problems including past bariatric surgery, rheumatic arthritis some kidney problems.  Substance abuse history: Past history of overuse of opiates not currently abusing drugs or alcohol by her report.  Mental status exam: Patient is a casually dressed woman who looks her stated age. Cooperative and appropriate. Good eye contact. Normal psychomotor activity. Speech normal rate tone and volume. Affect sad and dysphoric but reactive. Thoughts appear lucid. No sign of delusional thinking denies hallucinations. Chronic passive hopeless suicidal thoughts without any intention or plan. No homicidal ideation. Alert and oriented 4. Short and term and long-term memory appear grossly intact and the patient is capable of making reasonable medical decisions.  This is a 56 year old woman with chronic severe depression. No current psychosis. Past history of several prior hospitalizations  several suicide attempts self mutilation. Has been on a wide variety of medicines including mood stabilizers and antipsychotics and antidepressant medicine without sufficient benefit. Patient represents a reasonably good candidate for ECT. Discussed pros and cons risks and benefits of treatment. Patient given ample opportunity to ask questions. She is wanting to pursue ECT and we have discussed a tentative plan to begin treatment on October 24. Patient was given the order form and arrangements were made to get all of the labs done. I will notify the ECT treatment team and the outpatient psychiatrist. No immediate change to treatment plan for now.

## 2017-05-16 DIAGNOSIS — G8929 Other chronic pain: Secondary | ICD-10-CM | POA: Diagnosis not present

## 2017-05-16 DIAGNOSIS — M25551 Pain in right hip: Secondary | ICD-10-CM | POA: Diagnosis not present

## 2017-05-21 ENCOUNTER — Telehealth: Payer: Self-pay

## 2017-05-22 ENCOUNTER — Telehealth (HOSPITAL_COMMUNITY): Payer: Self-pay | Admitting: *Deleted

## 2017-05-22 ENCOUNTER — Other Ambulatory Visit: Payer: Self-pay | Admitting: Psychiatry

## 2017-05-22 NOTE — Telephone Encounter (Signed)
Called for Prior authorization of ECT. Spoke with [place] who states that no authorization is required.

## 2017-05-23 ENCOUNTER — Encounter: Payer: Self-pay | Admitting: Anesthesiology

## 2017-05-23 ENCOUNTER — Ambulatory Visit
Admission: RE | Admit: 2017-05-23 | Discharge: 2017-05-23 | Disposition: A | Discharge status: Home / Self Care | Payer: Medicare HMO | Source: Ambulatory Visit | Attending: Psychiatry | Admitting: Psychiatry

## 2017-05-23 DIAGNOSIS — Z8249 Family history of ischemic heart disease and other diseases of the circulatory system: Secondary | ICD-10-CM | POA: Diagnosis not present

## 2017-05-23 DIAGNOSIS — R69 Illness, unspecified: Secondary | ICD-10-CM | POA: Diagnosis not present

## 2017-05-23 DIAGNOSIS — E739 Lactose intolerance, unspecified: Secondary | ICD-10-CM | POA: Diagnosis not present

## 2017-05-23 DIAGNOSIS — M810 Age-related osteoporosis without current pathological fracture: Secondary | ICD-10-CM | POA: Insufficient documentation

## 2017-05-23 DIAGNOSIS — Z9884 Bariatric surgery status: Secondary | ICD-10-CM | POA: Diagnosis not present

## 2017-05-23 DIAGNOSIS — Z6841 Body Mass Index (BMI) 40.0 and over, adult: Secondary | ICD-10-CM | POA: Diagnosis not present

## 2017-05-23 DIAGNOSIS — F314 Bipolar disorder, current episode depressed, severe, without psychotic features: Secondary | ICD-10-CM

## 2017-05-23 DIAGNOSIS — F319 Bipolar disorder, unspecified: Secondary | ICD-10-CM | POA: Diagnosis present

## 2017-05-23 DIAGNOSIS — M797 Fibromyalgia: Secondary | ICD-10-CM | POA: Diagnosis not present

## 2017-05-23 DIAGNOSIS — M359 Systemic involvement of connective tissue, unspecified: Secondary | ICD-10-CM | POA: Diagnosis not present

## 2017-05-23 DIAGNOSIS — E039 Hypothyroidism, unspecified: Secondary | ICD-10-CM | POA: Insufficient documentation

## 2017-05-23 DIAGNOSIS — M069 Rheumatoid arthritis, unspecified: Secondary | ICD-10-CM | POA: Insufficient documentation

## 2017-05-23 DIAGNOSIS — M1990 Unspecified osteoarthritis, unspecified site: Secondary | ICD-10-CM | POA: Diagnosis not present

## 2017-05-23 DIAGNOSIS — F419 Anxiety disorder, unspecified: Secondary | ICD-10-CM | POA: Diagnosis not present

## 2017-05-23 MED ORDER — SUCCINYLCHOLINE CHLORIDE 200 MG/10ML IV SOSY
PREFILLED_SYRINGE | INTRAVENOUS | Status: DC | PRN
  Administered 2017-05-23: 140 mg via INTRAVENOUS

## 2017-05-23 MED ORDER — ONDANSETRON HCL 4 MG/2ML IJ SOLN
INTRAMUSCULAR | Status: DC | PRN
  Administered 2017-05-23: 4 mg via INTRAVENOUS

## 2017-05-23 MED ORDER — SODIUM CHLORIDE 0.9 % IV SOLN
500.0000 mL | Freq: Once | INTRAVENOUS | Status: AC
  Administered 2017-05-23: 500 mL via INTRAVENOUS

## 2017-05-23 MED ORDER — LABETALOL HCL 5 MG/ML IV SOLN
INTRAVENOUS | Status: AC
  Filled 2017-05-23: qty 4

## 2017-05-23 MED ORDER — FENTANYL CITRATE (PF) 100 MCG/2ML IJ SOLN: 25.0000 ug | INTRAMUSCULAR | Status: DC | PRN

## 2017-05-23 MED ORDER — ONDANSETRON HCL 4 MG/2ML IJ SOLN: 4.0000 mg | Freq: Once | INTRAMUSCULAR | Status: DC | PRN

## 2017-05-23 MED ORDER — KETOROLAC TROMETHAMINE 30 MG/ML IJ SOLN
INTRAMUSCULAR | Status: AC
Start: 2017-05-23 — End: 2017-05-23
  Administered 2017-05-23: 30 mg via INTRAVENOUS
  Filled 2017-05-23: qty 1

## 2017-05-23 MED ORDER — KETOROLAC TROMETHAMINE 30 MG/ML IJ SOLN
30.0000 mg | Freq: Once | INTRAMUSCULAR | Status: AC
  Administered 2017-05-23: 30 mg via INTRAVENOUS

## 2017-05-23 MED ORDER — METHOHEXITAL SODIUM 100 MG/10ML IV SOSY
PREFILLED_SYRINGE | INTRAVENOUS | Status: DC | PRN
  Administered 2017-05-23: 100 mg via INTRAVENOUS

## 2017-05-23 MED ORDER — ONDANSETRON HCL 4 MG/2ML IJ SOLN
INTRAMUSCULAR | Status: AC
  Filled 2017-05-23: qty 2

## 2017-05-23 MED ORDER — SODIUM CHLORIDE 0.9 % IV SOLN
INTRAVENOUS | Status: DC | PRN
  Administered 2017-05-23: 10:00:00 via INTRAVENOUS

## 2017-05-23 MED ORDER — SUCCINYLCHOLINE CHLORIDE 20 MG/ML IJ SOLN
INTRAMUSCULAR | Status: AC
  Filled 2017-05-23: qty 1

## 2017-05-23 NOTE — Discharge Instructions (Signed)
1)  The drugs that you have been given will stay in your system until tomorrow so for the       next 24 hours you should not:  A. Drive an automobile  B. Make any legal decisions  C. Drink any alcoholic beverages  2)  You may resume your regular meals upon return home.  3)  A responsible adult must take you home.  Someone should stay with you for a few          hours, then be available by phone for the remainder of the treatment day.  4)  You May experience any of the following symptoms:  Headache, Nausea and a dry mouth (due to the medications you were given),  temporary memory loss and some confusion, or sore muscles (a warm bath  should help this).  If you you experience any of these symptoms let us know on                your return visit.  5)  Report any of the following: any acute discomfort, severe headache, or temperature        greater than 100.5 F.   Also report any unusual redness, swelling, drainage, or pain         at your IV site.    You may report Symptoms to:  [place] [facility]          Phone: [phone], ECT Department           or Dr. Prescott Gum office [phone]  6)  Your next ECT Treatment is Friday May 25, 2017   We will call 2 days prior to your scheduled appointment for arrival times.  7)  Nothing to eat or drink after midnight the night before your procedure.  8)  Take     With a sip of water the morning of your procedure.  9)  Other Instructions: Call [phone] to cancel the morning of your procedure due         to illness or emergency.  10) We will call within 72 hours to assess how you are feeling.

## 2017-05-23 NOTE — Procedures (Signed)
ECT SERVICES Physician's Interval Evaluation & Treatment Note  Patient Identification: Jamie Bennett MRN:  [id number] Date of Evaluation:  05/23/2017 TX #: 1  MADRS: 38  MMSE: 30   P.E. Findings:  Physical exam unremarkable.  Heart and lungs normal.  Vitals unremarkable  Psychiatric Interval Note:  Mood depressed  Subjective:  Patient is a 56 y.o. female seen for evaluation for Electroconvulsive Therapy. Depressed  Treatment Summary:   []   Right Unilateral             [x]  Bilateral   % Energy : 1.0 ms 30%   Impedance: 2050 ohms  Seizure Energy Index: 25,788 V squared  Postictal Suppression Index: 92%  Seizure Concordance Index: 98%  Medications  Pre Shock: Toradol 30 mg Zofran 4 mg Brevital 100 mg succinylcholine 140 mg  Post Shock:    Seizure Duration: 40 seconds by EMG 44 seconds by EEG   Comments: Continue on index course schedule  Lungs:  [x]   Clear to auscultation               []  Other:   Heart:    [x]   Regular rhythm             []  irregular rhythm    [x]   Previous H&P reviewed, patient examined and there are NO CHANGES                 []   Previous H&P reviewed, patient examined and there are changes noted.   Alethia Berthold, MD 10/24/201810:35 AM

## 2017-05-23 NOTE — Anesthesia Procedure Notes (Signed)
Date/Time: 05/23/2017 10:45 AM Performed by: Dionne Bucy Pre-anesthesia Checklist: Patient identified, Emergency Drugs available, Suction available and Patient being monitored Patient Re-evaluated:Patient Re-evaluated prior to induction Oxygen Delivery Method: Circle system utilized Preoxygenation: Pre-oxygenation with 100% oxygen Induction Type: IV induction Ventilation: Mask ventilation without difficulty and Mask ventilation throughout procedure Airway Equipment and Method: Bite block Placement Confirmation: positive ETCO2 Dental Injury: Teeth and Oropharynx as per pre-operative assessment

## 2017-05-23 NOTE — H&P (Signed)
Jamie Bennett is an 56 y.o. female.   Chief Complaint: Major depression severe recurrent as part of bipolar now receiving ECT continues to be depressed HPI: As noted above history of bipolar with extended depression  Past Medical History:  Diagnosis Date  . Anemia   . Anxiety   . Bipolar 1 disorder ([place])   . Collagen vascular disease ([place])    rhematoid arthritis  . Constipation   . Fibromyalgia   . Hypotension   . Hypothyroidism   . Osteoarthritis   . Osteoporosis   . PONV (postoperative nausea and vomiting)    nausea only  . Rheumatoid arthritis ([place])   . Thyroid disease     Past Surgical History:  Procedure Laterality Date  . CHOLECYSTECTOMY    . GASTRIC BYPASS    . TONSILLECTOMY      Family History  Problem Relation Age of Onset  . Depression Mother   . Dementia Mother   . Heart disease Mother   . Parkinson's disease Father    Social History:  reports that she has never smoked. She has never used smokeless tobacco. She reports that she does not drink alcohol or use drugs.  Allergies:  Allergies  Allergen Reactions  . Lactose Intolerance (Gi) Other (See Comments)    Bloating and GI distress     (Not in a hospital admission)  No results found for this or any previous visit (from the past 48 hour(s)). No results found.  Review of Systems  Constitutional: Negative.   HENT: Negative.   Eyes: Negative.   Respiratory: Negative.   Cardiovascular: Negative.   Gastrointestinal: Negative.   Musculoskeletal: Negative.   Skin: Negative.   Neurological: Negative.   Psychiatric/Behavioral: Positive for depression. Negative for hallucinations, memory loss, substance abuse and suicidal ideas. The patient is not nervous/anxious and does not have insomnia.     Blood pressure 118/78, pulse 73, temperature 97.9 F (36.6 C), temperature source Tympanic, resp. rate 16, height 5\' 5"  (1.651 m), weight (!) 328 lb (148.8 kg), SpO2 97 %. Physical Exam  Nursing note  and vitals reviewed. Constitutional: She appears well-developed and well-nourished.  HENT:  Head: Normocephalic and atraumatic.  Eyes: Pupils are equal, round, and reactive to light. Conjunctivae are normal.  Neck: Normal range of motion.  Cardiovascular: Normal heart sounds.   Respiratory: Effort normal. No respiratory distress.  GI: Soft.  Musculoskeletal: Normal range of motion.  Neurological: She is alert.  Skin: Skin is warm and dry.  Psychiatric: Her speech is normal and behavior is normal. Judgment and thought content normal. Cognition and memory are normal. She exhibits a depressed mood.     Assessment/Plan Patient has no physical contraindications medical exam all normal.  ECT today and continue 3 times a week scheduled for index course  Alethia Berthold, MD 05/23/2017, 10:33 AM

## 2017-05-23 NOTE — Anesthesia Preprocedure Evaluation (Signed)
Anesthesia Evaluation  Patient identified by MRN, date of birth, ID band Patient awake    Reviewed: Allergy & Precautions, NPO status , Patient's Chart, lab work & pertinent test results, reviewed documented beta blocker date and time   History of Anesthesia Complications (+) PONV and history of anesthetic complications  Airway Mallampati: III  TM Distance: >3 FB     Dental  (+) Chipped   Pulmonary           Cardiovascular      Neuro/Psych PSYCHIATRIC DISORDERS Anxiety Bipolar Disorder  Neuromuscular disease    GI/Hepatic   Endo/Other  Hypothyroidism Morbid obesity  Renal/GU Renal disease     Musculoskeletal  (+) Arthritis , Fibromyalgia -  Abdominal   Peds  Hematology  (+) anemia ,   Anesthesia Other Findings Drug abuse. G - bypass. EKG ok.  Reproductive/Obstetrics                             Anesthesia Physical Anesthesia Plan  ASA: III  Anesthesia Plan: General   Post-op Pain Management:    Induction: Intravenous  PONV Risk Score and Plan:   Airway Management Planned:   Additional Equipment:   Intra-op Plan:   Post-operative Plan:   Informed Consent: I have reviewed the patients History and Physical, chart, labs and discussed the procedure including the risks, benefits and alternatives for the proposed anesthesia with the patient or authorized representative who has indicated his/her understanding and acceptance.     Plan Discussed with: CRNA  Anesthesia Plan Comments:         Anesthesia Quick Evaluation

## 2017-05-23 NOTE — Anesthesia Postprocedure Evaluation (Signed)
Anesthesia Post Note  Patient: Jamie Bennett  Procedure(s) Performed: ECT TX  Patient location during evaluation: PACU Anesthesia Type: General Level of consciousness: awake and alert Pain management: pain level controlled Vital Signs Assessment: post-procedure vital signs reviewed and stable Respiratory status: spontaneous breathing, nonlabored ventilation, respiratory function stable and patient connected to nasal cannula oxygen Cardiovascular status: blood pressure returned to baseline and stable Postop Assessment: no apparent nausea or vomiting Anesthetic complications: no     Last Vitals:  Vitals:   05/23/17 1144 05/23/17 1150  BP: 95/73 109/73  Pulse: 60 62  Resp: 12 12  Temp:  36.6 C  SpO2: 100%     Last Pain:  Vitals:   05/23/17 1150  TempSrc: Oral  PainSc: [place]

## 2017-05-23 NOTE — Transfer of Care (Signed)
Immediate Anesthesia Transfer of Care Note  Patient: Jamie Bennett  Procedure(s) Performed: ECT TX  Patient Location: PACU  Anesthesia Type:General  Level of Consciousness: sedated  Airway & Oxygen Therapy: Patient Spontanous Breathing and Patient connected to nasal cannula oxygen  Post-op Assessment: Report given to RN and Post -op Vital signs reviewed and stable  Post vital signs: Reviewed and stable  Last Vitals:  Vitals:   05/23/17 0821 05/23/17 1054  BP: 118/78 (!) 134/100  Pulse: 73 78  Resp: 16 18  Temp: 36.6 C 37.2 C  SpO2: 97% 93%    Last Pain:  Vitals:   05/23/17 0821  TempSrc: Tympanic  PainSc: 5          Complications: No apparent anesthesia complications

## 2017-05-23 NOTE — Anesthesia Post-op Follow-up Note (Signed)
Anesthesia QCDR form completed.        

## 2017-05-23 NOTE — OR Nursing (Signed)
Patient c/o "swollen tongue".  Upon exam noted right side of tongue to be moderately  swollen without laceration or discoloration.  Some difficulty pronouncing words and states slight difficulty swallowing but denies shortness of breath.  Dr. Weber Cooks at bedside and notified.  Dr. Marcello Moores notified by phone and will see patient.

## 2017-05-24 DIAGNOSIS — M25551 Pain in right hip: Secondary | ICD-10-CM | POA: Diagnosis not present

## 2017-05-24 DIAGNOSIS — G8929 Other chronic pain: Secondary | ICD-10-CM | POA: Diagnosis not present

## 2017-05-25 ENCOUNTER — Telehealth: Payer: Self-pay

## 2017-05-25 ENCOUNTER — Encounter: Payer: Self-pay | Admitting: Anesthesiology

## 2017-05-25 ENCOUNTER — Encounter
Admission: RE | Admit: 2017-05-25 | Discharge: 2017-05-25 | Disposition: A | Discharge status: Home / Self Care | Payer: Medicare HMO | Source: Ambulatory Visit | Attending: Psychiatry | Admitting: Psychiatry

## 2017-05-25 ENCOUNTER — Other Ambulatory Visit: Payer: Self-pay | Admitting: Psychiatry

## 2017-05-25 DIAGNOSIS — M810 Age-related osteoporosis without current pathological fracture: Secondary | ICD-10-CM | POA: Insufficient documentation

## 2017-05-25 DIAGNOSIS — Z6841 Body Mass Index (BMI) 40.0 and over, adult: Secondary | ICD-10-CM | POA: Diagnosis not present

## 2017-05-25 DIAGNOSIS — F419 Anxiety disorder, unspecified: Secondary | ICD-10-CM | POA: Insufficient documentation

## 2017-05-25 DIAGNOSIS — F319 Bipolar disorder, unspecified: Secondary | ICD-10-CM | POA: Diagnosis not present

## 2017-05-25 DIAGNOSIS — F3130 Bipolar disorder, current episode depressed, mild or moderate severity, unspecified: Secondary | ICD-10-CM | POA: Diagnosis not present

## 2017-05-25 DIAGNOSIS — M797 Fibromyalgia: Secondary | ICD-10-CM | POA: Diagnosis not present

## 2017-05-25 DIAGNOSIS — R69 Illness, unspecified: Secondary | ICD-10-CM | POA: Diagnosis not present

## 2017-05-25 DIAGNOSIS — F329 Major depressive disorder, single episode, unspecified: Secondary | ICD-10-CM | POA: Diagnosis present

## 2017-05-25 DIAGNOSIS — Z9884 Bariatric surgery status: Secondary | ICD-10-CM | POA: Diagnosis not present

## 2017-05-25 DIAGNOSIS — E039 Hypothyroidism, unspecified: Secondary | ICD-10-CM | POA: Diagnosis not present

## 2017-05-25 DIAGNOSIS — M069 Rheumatoid arthritis, unspecified: Secondary | ICD-10-CM | POA: Diagnosis not present

## 2017-05-25 MED ORDER — SUCCINYLCHOLINE CHLORIDE 200 MG/10ML IV SOSY
PREFILLED_SYRINGE | INTRAVENOUS | Status: DC | PRN
  Administered 2017-05-25: 140 mg via INTRAVENOUS

## 2017-05-25 MED ORDER — SODIUM CHLORIDE 0.9 % IV SOLN
INTRAVENOUS | Status: DC | PRN
  Administered 2017-05-25: 09:00:00 via INTRAVENOUS

## 2017-05-25 MED ORDER — SODIUM CHLORIDE 0.9 % IV SOLN
500.0000 mL | Freq: Once | INTRAVENOUS | Status: AC
  Administered 2017-05-25: 500 mL via INTRAVENOUS

## 2017-05-25 MED ORDER — FENTANYL CITRATE (PF) 100 MCG/2ML IJ SOLN: 25.0000 ug | INTRAMUSCULAR | Status: DC | PRN

## 2017-05-25 MED ORDER — KETOROLAC TROMETHAMINE 30 MG/ML IJ SOLN
30.0000 mg | Freq: Once | INTRAMUSCULAR | Status: AC
  Administered 2017-05-25: 30 mg via INTRAVENOUS

## 2017-05-25 MED ORDER — KETOROLAC TROMETHAMINE 30 MG/ML IJ SOLN
INTRAMUSCULAR | Status: AC
  Administered 2017-05-25: 30 mg via INTRAVENOUS
  Filled 2017-05-25: qty 1

## 2017-05-25 MED ORDER — ONDANSETRON HCL 4 MG/2ML IJ SOLN
INTRAMUSCULAR | Status: AC
  Filled 2017-05-25: qty 2

## 2017-05-25 MED ORDER — ONDANSETRON HCL 4 MG/2ML IJ SOLN
4.0000 mg | Freq: Once | INTRAMUSCULAR | Status: AC
  Administered 2017-05-25: 4 mg via INTRAVENOUS

## 2017-05-25 MED ORDER — METHOHEXITAL SODIUM 100 MG/10ML IV SOSY
PREFILLED_SYRINGE | INTRAVENOUS | Status: DC | PRN
  Administered 2017-05-25: 100 mg via INTRAVENOUS

## 2017-05-25 MED ORDER — ONDANSETRON HCL 4 MG/2ML IJ SOLN: 4.0000 mg | Freq: Once | INTRAMUSCULAR | Status: DC | PRN

## 2017-05-25 MED ORDER — SUCCINYLCHOLINE CHLORIDE 20 MG/ML IJ SOLN
INTRAMUSCULAR | Status: AC
  Filled 2017-05-25: qty 1

## 2017-05-25 NOTE — Anesthesia Post-op Follow-up Note (Signed)
Anesthesia QCDR form completed.        

## 2017-05-25 NOTE — Progress Notes (Signed)
DBP 93 to 97 no new orders from dr piscitello

## 2017-05-25 NOTE — Anesthesia Procedure Notes (Signed)
Date/Time: 05/25/2017 10:59 AM Performed by: Dionne Bucy Pre-anesthesia Checklist: Patient identified, Emergency Drugs available, Suction available and Patient being monitored Patient Re-evaluated:Patient Re-evaluated prior to induction Oxygen Delivery Method: Circle system utilized Preoxygenation: Pre-oxygenation with 100% oxygen Induction Type: IV induction Ventilation: Mask ventilation without difficulty and Mask ventilation throughout procedure Airway Equipment and Method: Bite block Placement Confirmation: positive ETCO2 Dental Injury: Teeth and Oropharynx as per pre-operative assessment

## 2017-05-25 NOTE — Anesthesia Preprocedure Evaluation (Signed)
Anesthesia Evaluation  Patient identified by MRN, date of birth, ID band Patient awake    Reviewed: Allergy & Precautions, NPO status , Patient's Chart, lab work & pertinent test results, reviewed documented beta blocker date and time   History of Anesthesia Complications (+) PONV and history of anesthetic complications  Airway Mallampati: III  TM Distance: >3 FB     Dental  (+) Chipped   Pulmonary    Pulmonary exam normal        Cardiovascular Normal cardiovascular exam     Neuro/Psych PSYCHIATRIC DISORDERS Anxiety Bipolar Disorder  Neuromuscular disease    GI/Hepatic   Endo/Other  Hypothyroidism Morbid obesity  Renal/GU Renal disease     Musculoskeletal  (+) Arthritis , Fibromyalgia -  Abdominal (+) + obese,   Peds  Hematology  (+) anemia ,   Anesthesia Other Findings Drug abuse. G - bypass. EKG ok.  Reproductive/Obstetrics                             Anesthesia Physical  Anesthesia Plan  ASA: III  Anesthesia Plan: General   Post-op Pain Management:    Induction: Intravenous  PONV Risk Score and Plan:   Airway Management Planned: Mask  Additional Equipment:   Intra-op Plan:   Post-operative Plan:   Informed Consent: I have reviewed the patients History and Physical, chart, labs and discussed the procedure including the risks, benefits and alternatives for the proposed anesthesia with the patient or authorized representative who has indicated his/her understanding and acceptance.     Plan Discussed with: CRNA  Anesthesia Plan Comments:         Anesthesia Quick Evaluation

## 2017-05-25 NOTE — Progress Notes (Signed)
Pt fairly quick to answer questions

## 2017-05-25 NOTE — Procedures (Signed)
ECT SERVICES Physician's Interval Evaluation & Treatment Note  Patient Identification: LEXXIE WINBERG MRN:  [id number] Date of Evaluation:  05/25/2017 TX #: 2  MADRS:   MMSE:   P.E. Findings:  No change to physical exam other than a bruise on the right side of the tongue.  Psychiatric Interval Note:  Mood is stable no real change no complaints of any cognitive change  Subjective:  Patient is a 56 y.o. female seen for evaluation for Electroconvulsive Therapy. No specific new complaint  Treatment Summary:   []   Right Unilateral             [x]  Bilateral   % Energy : 1.0 ms 40%   Impedance: 1820 ohms  Seizure Energy Index: 20,506 V square  Postictal Suppression Index: 97%  Seizure Concordance Index: 98%  Medications  Pre Shock: Zofran 4 mg Toradol 30 mg Brevital 100 mg succinylcholine 140 mg  Post Shock:    Seizure Duration: 41 seconds EMG 41 seconds EEG   Comments: Follow-up Monday Wednesday and Friday next week  Lungs:  [x]   Clear to auscultation               []  Other:   Heart:    [x]   Regular rhythm             []  irregular rhythm    [x]   Previous H&P reviewed, patient examined and there are NO CHANGES                 []   Previous H&P reviewed, patient examined and there are changes noted.   Alethia Berthold, MD 10/26/201810:52 AM

## 2017-05-25 NOTE — Transfer of Care (Signed)
Immediate Anesthesia Transfer of Care Note  Patient: Jamie Bennett  Procedure(s) Performed: ECT TX  Patient Location: PACU  Anesthesia Type:General  Level of Consciousness: sedated  Airway & Oxygen Therapy: Patient Spontanous Breathing and Patient connected to face mask oxygen  Post-op Assessment: Report given to RN and Post -op Vital signs reviewed and stable  Post vital signs: Reviewed and stable  Last Vitals:  Vitals:   05/25/17 0852  BP: 118/82  Pulse: 67  Temp: (!) 36.2 C  SpO2: 100%    Last Pain:  Vitals:   05/25/17 0852  TempSrc: Oral         Complications: No apparent anesthesia complications

## 2017-05-25 NOTE — Discharge Instructions (Signed)
1)  The drugs that you have been given will stay in your system until tomorrow so for the       next 24 hours you should not:  A. Drive an automobile  B. Make any legal decisions  C. Drink any alcoholic beverages  2)  You may resume your regular meals upon return home.  3)  A responsible adult must take you home.  Someone should stay with you for a few          hours, then be available by phone for the remainder of the treatment day.  4)  You May experience any of the following symptoms:  Headache, Nausea and a dry mouth (due to the medications you were given),  temporary memory loss and some confusion, or sore muscles (a warm bath  should help this).  If you you experience any of these symptoms let us know on                your return visit.  5)  Report any of the following: any acute discomfort, severe headache, or temperature        greater than 100.5 F.   Also report any unusual redness, swelling, drainage, or pain         at your IV site.    You may report Symptoms to:  [place] [facility]          Phone: [phone], ECT Department           or Dr. Prescott Gum office [phone]  6)  Your next ECT Treatment is Monday October 29 at   0815   We will call 2 days prior to your scheduled appointment for arrival times.  7)  Nothing to eat or drink after midnight the night before your procedure.  8)  Take     With a sip of water the morning of your procedure.  9)  Other Instructions: Call [phone] to cancel the morning of your procedure due         to illness or emergency.  10) We will call within 72 hours to assess how you are feeling.

## 2017-05-25 NOTE — Anesthesia Postprocedure Evaluation (Signed)
Anesthesia Post Note  Patient: Jamie Bennett  Procedure(s) Performed: ECT TX  Patient location during evaluation: PACU Anesthesia Type: General Level of consciousness: awake and alert Pain management: pain level controlled Vital Signs Assessment: post-procedure vital signs reviewed and stable Respiratory status: spontaneous breathing, nonlabored ventilation, respiratory function stable and patient connected to nasal cannula oxygen Cardiovascular status: blood pressure returned to baseline and stable Postop Assessment: no apparent nausea or vomiting Anesthetic complications: no     Last Vitals:  Vitals:   05/25/17 1135 05/25/17 1151  BP: (!) 140/93 133/88  Pulse: 65 62  Resp: (!) 9 16  Temp:  36.8 C  SpO2: 100%     Last Pain:  Vitals:   05/25/17 1151  TempSrc: Oral                 Precious Haws Piscitello

## 2017-05-25 NOTE — H&P (Signed)
Jamie Bennett is an 56 y.o. female.   Chief Complaint: Continues with depression.  Only new physical complaint is the pain and bruising and swelling on the right side of her tongue HPI: History of recurrent severe depression currently receiving ECT for depressive symptoms  Past Medical History:  Diagnosis Date  . Anemia   . Anxiety   . Bipolar 1 disorder ([place])   . Collagen vascular disease ([place])    rhematoid arthritis  . Constipation   . Fibromyalgia   . Hypotension   . Hypothyroidism   . Osteoarthritis   . Osteoporosis   . PONV (postoperative nausea and vomiting)    nausea only  . Rheumatoid arthritis ([place])   . Thyroid disease     Past Surgical History:  Procedure Laterality Date  . CHOLECYSTECTOMY    . GASTRIC BYPASS    . TONSILLECTOMY      Family History  Problem Relation Age of Onset  . Depression Mother   . Dementia Mother   . Heart disease Mother   . Parkinson's disease Father    Social History:  reports that she has never smoked. She has never used smokeless tobacco. She reports that she does not drink alcohol or use drugs.  Allergies:  Allergies  Allergen Reactions  . Lactose Intolerance (Gi) Other (See Comments)    Bloating and GI distress     (Not in a hospital admission)  No results found for this or any previous visit (from the past 48 hour(s)). No results found.  Review of Systems  Constitutional: Negative.   HENT: Negative.   Eyes: Negative.   Respiratory: Negative.   Cardiovascular: Negative.   Gastrointestinal: Negative.   Musculoskeletal: Negative.   Skin: Negative.   Neurological: Negative.   Psychiatric/Behavioral: Positive for depression. Negative for hallucinations, memory loss, substance abuse and suicidal ideas. The patient is not nervous/anxious and does not have insomnia.     Blood pressure 118/82, pulse 67, temperature (!) 97.2 F (36.2 C), temperature source Oral, height 5\' 5"  (1.651 m), weight (!) 326 lb (147.9 kg),  SpO2 100 %. Physical Exam  Nursing note and vitals reviewed. Constitutional: She appears well-developed and well-nourished.  HENT:  Head: Normocephalic and atraumatic.    Eyes: Pupils are equal, round, and reactive to light. Conjunctivae are normal.  Neck: Normal range of motion.  Cardiovascular: Regular rhythm and normal heart sounds.   Respiratory: Effort normal. No respiratory distress.  GI: Soft.  Musculoskeletal: Normal range of motion.  Neurological: She is alert.  Skin: Skin is warm and dry.  Psychiatric: Judgment normal. Her affect is blunt. Her speech is delayed. She is slowed. Thought content is not paranoid. Cognition and memory are normal. She expresses no homicidal and no suicidal ideation.     Assessment/Plan Treatment team aware of the potential for oral trauma.  Otherwise no change to treatment plan  Alethia Berthold, MD 05/25/2017, 10:49 AM

## 2017-05-28 ENCOUNTER — Encounter (HOSPITAL_BASED_OUTPATIENT_CLINIC_OR_DEPARTMENT_OTHER)
Admission: RE | Admit: 2017-05-28 | Discharge: 2017-05-28 | Disposition: A | Discharge status: Home / Self Care | Payer: Medicare HMO | Source: Ambulatory Visit | Attending: Psychiatry | Admitting: Psychiatry

## 2017-05-28 ENCOUNTER — Ambulatory Visit: Payer: Medicare HMO

## 2017-05-28 ENCOUNTER — Encounter: Payer: Self-pay | Admitting: Anesthesiology

## 2017-05-28 DIAGNOSIS — M1990 Unspecified osteoarthritis, unspecified site: Secondary | ICD-10-CM | POA: Diagnosis not present

## 2017-05-28 DIAGNOSIS — M797 Fibromyalgia: Secondary | ICD-10-CM | POA: Diagnosis not present

## 2017-05-28 DIAGNOSIS — Z9884 Bariatric surgery status: Secondary | ICD-10-CM | POA: Diagnosis not present

## 2017-05-28 DIAGNOSIS — R69 Illness, unspecified: Secondary | ICD-10-CM | POA: Diagnosis not present

## 2017-05-28 DIAGNOSIS — F332 Major depressive disorder, recurrent severe without psychotic features: Secondary | ICD-10-CM | POA: Diagnosis not present

## 2017-05-28 DIAGNOSIS — E039 Hypothyroidism, unspecified: Secondary | ICD-10-CM | POA: Diagnosis not present

## 2017-05-28 DIAGNOSIS — F419 Anxiety disorder, unspecified: Secondary | ICD-10-CM | POA: Diagnosis not present

## 2017-05-28 MED ORDER — SODIUM CHLORIDE 0.9 % IV SOLN
INTRAVENOUS | Status: DC | PRN
  Administered 2017-05-28: 11:00:00 via INTRAVENOUS

## 2017-05-28 MED ORDER — SODIUM CHLORIDE 0.9 % IV SOLN
500.0000 mL | Freq: Once | INTRAVENOUS | Status: AC
  Administered 2017-05-28: 500 mL via INTRAVENOUS

## 2017-05-28 MED ORDER — KETOROLAC TROMETHAMINE 30 MG/ML IJ SOLN
INTRAMUSCULAR | Status: AC
  Administered 2017-05-28: 30 mg via INTRAVENOUS
  Filled 2017-05-28: qty 1

## 2017-05-28 MED ORDER — METHOHEXITAL SODIUM 100 MG/10ML IV SOSY
PREFILLED_SYRINGE | INTRAVENOUS | Status: DC | PRN
  Administered 2017-05-28: 100 mg via INTRAVENOUS

## 2017-05-28 MED ORDER — ONDANSETRON HCL 4 MG/2ML IJ SOLN
INTRAMUSCULAR | Status: AC
  Administered 2017-05-28: 4 mg via INTRAVENOUS
  Filled 2017-05-28: qty 2

## 2017-05-28 MED ORDER — SUCCINYLCHOLINE CHLORIDE 20 MG/ML IJ SOLN
INTRAMUSCULAR | Status: DC | PRN
  Administered 2017-05-28: 140 mg via INTRAVENOUS

## 2017-05-28 MED ORDER — KETOROLAC TROMETHAMINE 30 MG/ML IJ SOLN
30.0000 mg | Freq: Once | INTRAMUSCULAR | Status: AC
  Administered 2017-05-28: 30 mg via INTRAVENOUS

## 2017-05-28 MED ORDER — ONDANSETRON HCL 4 MG/2ML IJ SOLN
4.0000 mg | Freq: Once | INTRAMUSCULAR | Status: AC
  Administered 2017-05-28: 4 mg via INTRAVENOUS

## 2017-05-28 MED ORDER — SUCCINYLCHOLINE CHLORIDE 20 MG/ML IJ SOLN
INTRAMUSCULAR | Status: AC
  Filled 2017-05-28: qty 1

## 2017-05-28 NOTE — Discharge Instructions (Signed)
1)  The drugs that you have been given will stay in your system until tomorrow so for the       next 24 hours you should not:  A. Drive an automobile  B. Make any legal decisions  C. Drink any alcoholic beverages  2)  You may resume your regular meals upon return home.  3)  A responsible adult must take you home.  Someone should stay with you for a few          hours, then be available by phone for the remainder of the treatment day.  4)  You May experience any of the following symptoms:  Headache, Nausea and a dry mouth (due to the medications you were given),  temporary memory loss and some confusion, or sore muscles (a warm bath  should help this).  If you you experience any of these symptoms let us know on                your return visit.  5)  Report any of the following: any acute discomfort, severe headache, or temperature        greater than 100.5 F.   Also report any unusual redness, swelling, drainage, or pain         at your IV site.    You may report Symptoms to:  [place] at [facility]          Phone: [phone], ECT Department           or Dr. Prescott Gum office [phone]  6)  Your next ECT Treatment is Day Wednesday Date May 30, 2017  We will call 2 days prior to your scheduled appointment for arrival times.  7)  Nothing to eat or drink after midnight the night before your procedure.  8)  Take .     With a sip of water the morning of your procedure.  9)  Other Instructions: Call [phone] to cancel the morning of your procedure due         to illness or emergency.  10) We will call within 72 hours to assess how you are feeling.

## 2017-05-28 NOTE — Transfer of Care (Signed)
Immediate Anesthesia Transfer of Care Note  Patient: Jamie Bennett  Procedure(s) Performed: ECT TX  Patient Location: PACU  Anesthesia Type:General  Level of Consciousness: awake, alert  and oriented  Airway & Oxygen Therapy: Patient Spontanous Breathing and Patient connected to face mask oxygen  Post-op Assessment: Report given to RN and Post -op Vital signs reviewed and stable  Post vital signs: Reviewed and stable  Last Vitals:  Vitals:   05/28/17 0904 05/28/17 1115  BP: 110/80 121/80  Pulse: 72 78  Resp: 16 16  Temp: 36.5 C 37.1 C  SpO2: 98% 96%    Last Pain:  Vitals:   05/28/17 0904  TempSrc: Oral  PainSc: 0-No pain         Complications: No apparent anesthesia complications

## 2017-05-28 NOTE — Anesthesia Preprocedure Evaluation (Signed)
Anesthesia Evaluation  Patient identified by MRN, date of birth, ID band Patient awake    Reviewed: Allergy & Precautions, NPO status , Patient's Chart, lab work & pertinent test results, reviewed documented beta blocker date and time   History of Anesthesia Complications (+) PONV and history of anesthetic complications  Airway Mallampati: III  TM Distance: >3 FB     Dental  (+) Chipped   Pulmonary    Pulmonary exam normal        Cardiovascular Normal cardiovascular exam     Neuro/Psych PSYCHIATRIC DISORDERS Anxiety Bipolar Disorder  Neuromuscular disease    GI/Hepatic   Endo/Other  Hypothyroidism Morbid obesity  Renal/GU Renal disease     Musculoskeletal  (+) Arthritis , Fibromyalgia -  Abdominal (+) + obese,   Peds  Hematology  (+) anemia ,   Anesthesia Other Findings Drug abuse. G - bypass. EKG ok.  Reproductive/Obstetrics                             Anesthesia Physical  Anesthesia Plan  ASA: III  Anesthesia Plan: General   Post-op Pain Management:    Induction: Intravenous  PONV Risk Score and Plan:   Airway Management Planned: Mask  Additional Equipment:   Intra-op Plan:   Post-operative Plan:   Informed Consent: I have reviewed the patients History and Physical, chart, labs and discussed the procedure including the risks, benefits and alternatives for the proposed anesthesia with the patient or authorized representative who has indicated his/her understanding and acceptance.     Plan Discussed with: CRNA  Anesthesia Plan Comments:         Anesthesia Quick Evaluation

## 2017-05-28 NOTE — Anesthesia Post-op Follow-up Note (Signed)
Anesthesia QCDR form completed.        

## 2017-05-28 NOTE — Procedures (Signed)
ECT SERVICES Physician's Interval Evaluation & Treatment Note  Patient Identification: NEHA WAIGHT MRN:  [id number] Date of Evaluation:  05/28/2017 TX #: 3  MADRS:   MMSE:   P.E. Findings:  No change to physical exam  Psychiatric Interval Note:  Mood still depressed  Subjective:  Patient is a 56 y.o. female seen for evaluation for Electroconvulsive Therapy. Tongue is still sore  Treatment Summary:   []   Right Unilateral             [x]  Bilateral   % Energy : 1.0 ms 40%   Impedance: 2250 ohms  Seizure Energy Index: 16,376 V squared  Postictal Suppression Index: 95%  Seizure Concordance Index: 97%  Medications  Pre Shock: Zofran 4 mg Toradol 30 mg Brevital 100 mg succinylcholine 140 mg  Post Shock:    Seizure Duration: 38 seconds by EMG 49 seconds by EEG   Comments: Continue index course  Lungs:  [x]   Clear to auscultation               []  Other:   Heart:    [x]   Regular rhythm             []  irregular rhythm    [x]   Previous H&P reviewed, patient examined and there are NO CHANGES                 []   Previous H&P reviewed, patient examined and there are changes noted.   Alethia Berthold, MD 10/29/201810:58 AM

## 2017-05-28 NOTE — Anesthesia Procedure Notes (Addendum)
Procedure Name: General with mask airway Date/Time: 05/28/2017 11:00 AM Performed by: Johnna Acosta Pre-anesthesia Checklist: Patient identified, Emergency Drugs available, Suction available, Patient being monitored and Timeout performed Patient Re-evaluated:Patient Re-evaluated prior to induction Oxygen Delivery Method: Circle system utilized Preoxygenation: Pre-oxygenation with 100% oxygen Induction Type: IV induction Ventilation: Mask ventilation without difficulty

## 2017-05-28 NOTE — Anesthesia Postprocedure Evaluation (Signed)
Anesthesia Post Note  Patient: Jamie Bennett  Procedure(s) Performed: ECT TX  Patient location during evaluation: PACU Anesthesia Type: General Level of consciousness: awake and alert Pain management: pain level controlled Vital Signs Assessment: post-procedure vital signs reviewed and stable Respiratory status: spontaneous breathing, nonlabored ventilation, respiratory function stable and patient connected to nasal cannula oxygen Cardiovascular status: blood pressure returned to baseline and stable Postop Assessment: no apparent nausea or vomiting Anesthetic complications: no     Last Vitals:  Vitals:   05/28/17 1145 05/28/17 1155  BP: 115/76 (!) 123/95  Pulse: 68 67  Resp: (!) 9 16  Temp: (P) 36.8 C 36.8 C  SpO2: 100%     Last Pain:  Vitals:   05/28/17 1155  TempSrc: Oral  PainSc:                  Precious Haws Piscitello

## 2017-05-28 NOTE — H&P (Signed)
Jamie Bennett is an 56 y.o. female.   Chief Complaint: No specific complaint other than depression and sore tongue HPI: Patient with severe recurrent depression  Past Medical History:  Diagnosis Date  . Anemia   . Anxiety   . Bipolar 1 disorder ([place])   . Collagen vascular disease ([place])    rhematoid arthritis  . Constipation   . Fibromyalgia   . Hypotension   . Hypothyroidism   . Osteoarthritis   . Osteoporosis   . PONV (postoperative nausea and vomiting)    nausea only  . Rheumatoid arthritis ([place])   . Thyroid disease     Past Surgical History:  Procedure Laterality Date  . CHOLECYSTECTOMY    . GASTRIC BYPASS    . TONSILLECTOMY      Family History  Problem Relation Age of Onset  . Depression Mother   . Dementia Mother   . Heart disease Mother   . Parkinson's disease Father    Social History:  reports that she has never smoked. She has never used smokeless tobacco. She reports that she does not drink alcohol or use drugs.  Allergies:  Allergies  Allergen Reactions  . Lactose Intolerance (Gi) Other (See Comments)    Bloating and GI distress     (Not in a hospital admission)  No results found for this or any previous visit (from the past 48 hour(s)). No results found.  Review of Systems  Constitutional: Negative.   HENT: Negative.        Tongue is still sore  Eyes: Negative.   Respiratory: Negative.   Cardiovascular: Negative.   Gastrointestinal: Negative.   Musculoskeletal: Negative.   Skin: Negative.   Neurological: Negative.   Psychiatric/Behavioral: Positive for depression. Negative for hallucinations, memory loss, substance abuse and suicidal ideas. The patient is not nervous/anxious and does not have insomnia.     Blood pressure 110/80, pulse 72, temperature 97.7 F (36.5 C), temperature source Oral, resp. rate 16, height 5\' 5"  (1.651 m), weight (!) 321 lb (145.6 kg), SpO2 98 %. Physical Exam  Nursing note and vitals  reviewed. Constitutional: She appears well-developed and well-nourished.  HENT:  Head: Normocephalic and atraumatic.  Eyes: Pupils are equal, round, and reactive to light. Conjunctivae are normal.  Neck: Normal range of motion.  Cardiovascular: Regular rhythm and normal heart sounds.   Respiratory: Effort normal and breath sounds normal.  GI: Soft.  Musculoskeletal: Normal range of motion.  Neurological: She is alert.  Skin: Skin is warm and dry.  Psychiatric: Judgment and thought content normal. Her affect is blunt. Her speech is delayed. She is slowed. Cognition and memory are normal. She exhibits a depressed mood.     Assessment/Plan Treatment today and continued 3 times a week schedule going forward  Alethia Berthold, MD 05/28/2017, 10:56 AM

## 2017-05-29 ENCOUNTER — Other Ambulatory Visit: Payer: Self-pay | Admitting: Psychiatry

## 2017-05-30 ENCOUNTER — Encounter: Payer: Self-pay | Admitting: Anesthesiology

## 2017-05-30 ENCOUNTER — Encounter (HOSPITAL_BASED_OUTPATIENT_CLINIC_OR_DEPARTMENT_OTHER)
Admission: RE | Admit: 2017-05-30 | Discharge: 2017-05-30 | Disposition: A | Discharge status: Home / Self Care | Payer: Medicare HMO | Source: Ambulatory Visit | Attending: Psychiatry | Admitting: Psychiatry

## 2017-05-30 DIAGNOSIS — R69 Illness, unspecified: Secondary | ICD-10-CM | POA: Diagnosis not present

## 2017-05-30 DIAGNOSIS — F332 Major depressive disorder, recurrent severe without psychotic features: Secondary | ICD-10-CM

## 2017-05-30 DIAGNOSIS — F419 Anxiety disorder, unspecified: Secondary | ICD-10-CM | POA: Diagnosis not present

## 2017-05-30 DIAGNOSIS — Z9884 Bariatric surgery status: Secondary | ICD-10-CM | POA: Diagnosis not present

## 2017-05-30 MED ORDER — ONDANSETRON HCL 4 MG/2ML IJ SOLN: 4.0000 mg | Freq: Once | INTRAMUSCULAR | Status: DC | PRN

## 2017-05-30 MED ORDER — METHOHEXITAL SODIUM 0.5 G IJ SOLR
INTRAMUSCULAR | Status: AC
  Filled 2017-05-30: qty 500

## 2017-05-30 MED ORDER — SODIUM CHLORIDE 0.9 % IV SOLN
INTRAVENOUS | Status: DC | PRN
  Administered 2017-05-30: 09:00:00 via INTRAVENOUS

## 2017-05-30 MED ORDER — FENTANYL CITRATE (PF) 100 MCG/2ML IJ SOLN: 25.0000 ug | INTRAMUSCULAR | Status: DC | PRN

## 2017-05-30 MED ORDER — SODIUM CHLORIDE 0.9 % IV SOLN
500.0000 mL | Freq: Once | INTRAVENOUS | Status: AC
  Administered 2017-05-30: 500 mL via INTRAVENOUS

## 2017-05-30 MED ORDER — ONDANSETRON HCL 4 MG/2ML IJ SOLN
INTRAMUSCULAR | Status: AC
  Administered 2017-05-30: 4 mg via INTRAVENOUS
  Filled 2017-05-30: qty 2

## 2017-05-30 MED ORDER — KETOROLAC TROMETHAMINE 30 MG/ML IJ SOLN
INTRAMUSCULAR | Status: AC
  Filled 2017-05-30: qty 1

## 2017-05-30 MED ORDER — KETOROLAC TROMETHAMINE 30 MG/ML IJ SOLN
30.0000 mg | Freq: Once | INTRAMUSCULAR | Status: AC
  Administered 2017-05-30: 30 mg via INTRAVENOUS

## 2017-05-30 MED ORDER — METHOHEXITAL SODIUM 100 MG/10ML IV SOSY
PREFILLED_SYRINGE | INTRAVENOUS | Status: DC | PRN
  Administered 2017-05-30: 100 mg via INTRAVENOUS

## 2017-05-30 MED ORDER — SUCCINYLCHOLINE CHLORIDE 200 MG/10ML IV SOSY
PREFILLED_SYRINGE | INTRAVENOUS | Status: DC | PRN
  Administered 2017-05-30: 140 mg via INTRAVENOUS

## 2017-05-30 MED ORDER — ONDANSETRON HCL 4 MG/2ML IJ SOLN
4.0000 mg | Freq: Once | INTRAMUSCULAR | Status: AC
  Administered 2017-05-30: 4 mg via INTRAVENOUS

## 2017-05-30 NOTE — Anesthesia Post-op Follow-up Note (Signed)
Anesthesia QCDR form completed.        

## 2017-05-30 NOTE — Anesthesia Preprocedure Evaluation (Signed)
Anesthesia Evaluation  Patient identified by MRN, date of birth, ID band Patient awake    Reviewed: Allergy & Precautions, H&P , NPO status , Patient's Chart, lab work & pertinent test results, reviewed documented beta blocker date and time   History of Anesthesia Complications (+) PONV and history of anesthetic complications  Airway Mallampati: II   Neck ROM: full    Dental  (+) Teeth Intact   Pulmonary neg pulmonary ROS,    Pulmonary exam normal        Cardiovascular Exercise Tolerance: Good negative cardio ROS Normal cardiovascular exam Rhythm:regular Rate:Normal     Neuro/Psych PSYCHIATRIC DISORDERS  Neuromuscular disease negative neurological ROS  negative psych ROS   GI/Hepatic negative GI ROS, Neg liver ROS,   Endo/Other  negative endocrine ROSHypothyroidism   Renal/GU Renal diseasenegative Renal ROS  negative genitourinary   Musculoskeletal negative musculoskeletal ROS (+)   Abdominal   Peds negative pediatric ROS (+)  Hematology negative hematology ROS (+) anemia ,   Anesthesia Other Findings Past Medical History: No date: Anemia No date: Anxiety No date: Bipolar 1 disorder (HCC) No date: Collagen vascular disease ([place])     Comment:  rhematoid arthritis No date: Constipation No date: Fibromyalgia No date: Hypotension No date: Hypothyroidism No date: Osteoarthritis No date: Osteoporosis No date: PONV (postoperative nausea and vomiting)     Comment:  nausea only No date: Rheumatoid arthritis ([place]) No date: Thyroid disease Past Surgical History: No date: CHOLECYSTECTOMY No date: GASTRIC BYPASS No date: TONSILLECTOMY BMI    Body Mass Index:  53.25 kg/m     Reproductive/Obstetrics negative OB ROS                             Anesthesia Physical Anesthesia Plan  ASA: III  Anesthesia Plan: General   Post-op Pain Management:    Induction:   PONV Risk Score and  Plan:   Airway Management Planned:   Additional Equipment:   Intra-op Plan:   Post-operative Plan:   Informed Consent: I have reviewed the patients History and Physical, chart, labs and discussed the procedure including the risks, benefits and alternatives for the proposed anesthesia with the patient or authorized representative who has indicated his/her understanding and acceptance.   Dental Advisory Given  Plan Discussed with: CRNA  Anesthesia Plan Comments:         Anesthesia Quick Evaluation

## 2017-05-30 NOTE — Procedures (Signed)
ECT SERVICES Physician's Interval Evaluation & Treatment Note  Patient Identification: Jamie Bennett MRN:  [id number] Date of Evaluation:  05/30/2017 TX #: 4  MADRS: 22  MMSE: 30  P.E. Findings:  No change to physical exam  Psychiatric Interval Note:  Mood is about the same not significantly worse or better.  She does complain of having some tinnitus.  Subjective:  Patient is a 56 y.o. female seen for evaluation for Electroconvulsive Therapy. A buzzing sound in the ears but not hallucinations no other specific new changes  Treatment Summary:   []   Right Unilateral             [x]  Bilateral   % Energy : 1.0 ms 40%   Impedance: 2270 ohms  Seizure Energy Index: 23,169 V squared  Postictal Suppression Index: 89%  Seizure Concordance Index: 98%  Medications  Pre Shock: Zofran 4 mg Toradol 30 mg Brevital 100 mg succinylcholine 140 mg  Post Shock: 36 seconds by EMG 44 seconds by EEG  Seizure Duration: 36 seconds by EMG 44 seconds by EEG   Comments: She is having tinnitus in both ears for reasons that are unclear to me but I am going to go ahead and give her a break from treatment and see her back on Monday rather than Friday  Lungs:  [x]   Clear to auscultation               []  Other:   Heart:    [x]   Regular rhythm             []  irregular rhythm    [x]   Previous H&P reviewed, patient examined and there are NO CHANGES                 []   Previous H&P reviewed, patient examined and there are changes noted.   Alethia Berthold, MD 10/31/201810:20 AM

## 2017-05-30 NOTE — H&P (Signed)
Jamie Bennett is an 56 y.o. female.   Chief Complaint: Patient is having a buzzing tinnitus-like sound in both ears since last treatment.  Mood is about the same no other specific new complaint HPI: History of recurrent severe depression  Past Medical History:  Diagnosis Date  . Anemia   . Anxiety   . Bipolar 1 disorder ([place])   . Collagen vascular disease ([place])    rhematoid arthritis  . Constipation   . Fibromyalgia   . Hypotension   . Hypothyroidism   . Osteoarthritis   . Osteoporosis   . PONV (postoperative nausea and vomiting)    nausea only  . Rheumatoid arthritis ([place])   . Thyroid disease     Past Surgical History:  Procedure Laterality Date  . CHOLECYSTECTOMY    . GASTRIC BYPASS    . TONSILLECTOMY      Family History  Problem Relation Age of Onset  . Depression Mother   . Dementia Mother   . Heart disease Mother   . Parkinson's disease Father    Social History:  reports that she has never smoked. She has never used smokeless tobacco. She reports that she does not drink alcohol or use drugs.  Allergies:  Allergies  Allergen Reactions  . Lactose Intolerance (Gi) Other (See Comments)    Bloating and GI distress     (Not in a hospital admission)  No results found for this or any previous visit (from the past 48 hour(s)). No results found.  Review of Systems  Constitutional: Negative.   HENT: Positive for tinnitus.   Eyes: Negative.   Respiratory: Negative.   Cardiovascular: Negative.   Gastrointestinal: Negative.   Musculoskeletal: Negative.   Skin: Negative.   Neurological: Negative.   Psychiatric/Behavioral: Negative for depression, hallucinations, memory loss, substance abuse and suicidal ideas. The patient is not nervous/anxious and does not have insomnia.     Blood pressure (!) 146/82, pulse 91, temperature (!) 97.4 F (36.3 C), temperature source Oral, resp. rate 16, height 5\' 5"  (1.651 m), weight (!) 145.2 kg (320 lb), SpO2 98  %. Physical Exam  Nursing note and vitals reviewed. Constitutional: She appears well-developed and well-nourished.  HENT:  Head: Normocephalic and atraumatic.  Eyes: Pupils are equal, round, and reactive to light. Conjunctivae are normal.  Neck: Normal range of motion.  Cardiovascular: Regular rhythm and normal heart sounds.   Respiratory: Effort normal.  GI: Soft.  Musculoskeletal: Normal range of motion.  Neurological: She is alert.  Skin: Skin is warm and dry.  Psychiatric: Judgment normal. Her speech is delayed. She is slowed. Cognition and memory are normal. She exhibits a depressed mood. She expresses no suicidal ideation.     Assessment/Plan Treatment number for today.  Possibly showing a little improvement but concerning side effect.  We are going to do treatment today but then skip Friday and see her back on Monday  Alethia Berthold, MD 05/30/2017, 10:14 AM

## 2017-05-30 NOTE — Transfer of Care (Addendum)
Immediate Anesthesia Transfer of Care Note  Patient: Jamie Bennett  Procedure(s) Performed: ECT TX  Patient Location: PACU  Anesthesia Type:General  Level of Consciousness: sedated  Airway & Oxygen Therapy: Patient Spontanous Breathing and Patient connected to face mask oxygen  Post-op Assessment: Report given to RN and Post -op Vital signs reviewed and stable  Post vital signs: Reviewed and stable  Last Vitals:  Vitals:   05/30/17 1050 05/30/17 1103  BP: 107/85 107/85  Pulse: 73 73  Resp:  (!) 21  Temp: 36.6 C 36.6 C  SpO2: 98%     Last Pain:  Vitals:   05/30/17 1103  TempSrc: Oral  PainSc:          Complications: No apparent anesthesia complications

## 2017-05-30 NOTE — Discharge Instructions (Signed)
1)  The drugs that you have been given will stay in your system until tomorrow so for the       next 24 hours you should not:  A. Drive an automobile  B. Make any legal decisions  C. Drink any alcoholic beverages  2)  You may resume your regular meals upon return home.  3)  A responsible adult must take you home.  Someone should stay with you for a few          hours, then be available by phone for the remainder of the treatment day.  4)  You May experience any of the following symptoms:  Headache, Nausea and a dry mouth (due to the medications you were given),  temporary memory loss and some confusion, or sore muscles (a warm bath  should help this).  If you you experience any of these symptoms let us know on                your return visit.  5)  Report any of the following: any acute discomfort, severe headache, or temperature        greater than 100.5 F.   Also report any unusual redness, swelling, drainage, or pain         at your IV site.    You may report Symptoms to:  [place] at [facility]          Phone: [phone], ECT Department           or Dr. Prescott Gum office [phone]  6)  Your next ECT Treatment is Day Monday  Date June 04, 2017 at 0830  We will call 2 days prior to your scheduled appointment for arrival times.  7)  Nothing to eat or drink after midnight the night before your procedure.  8)  Take protonix   With a sip of water the morning of your procedure.  9)  Other Instructions: Call [phone] to cancel the morning of your procedure due         to illness or emergency.  10) We will call within 72 hours to assess how you are feeling. 1)  The drugs that you have been given will stay in your system until tomorrow so for the       next 24 hours you should not:  A. Drive an automobile  B. Make any legal decisions  C. Drink any alcoholic beverages  2)  You may resume your regular meals upon return home.  3)  A responsible adult must take you home.   Someone should stay with you for a few          hours, then be available by phone for the remainder of the treatment day.  4)  You May experience any of the following symptoms:  Headache, Nausea and a dry mouth (due to the medications you were given),  temporary memory loss and some confusion, or sore muscles (a warm bath  should help this).  If you you experience any of these symptoms let us know on                your return visit.  5)  Report any of the following: any acute discomfort, severe headache, or temperature        greater than 100.5 F.   Also report any unusual redness, swelling, drainage, or pain         at your IV site.    You may report  Symptoms to:  [place] at [facility]          Phone: [phone], ECT Department           or Dr. Prescott Gum office [phone]  6)  Your next ECT Treatment is Day Friday  Date June 01, 2017 We will call 2 days prior to your scheduled appointment for arrival times.  7)  Nothing to eat or drink after midnight the night before your procedure.  8)  Take .     With a sip of water the morning of your procedure.  9)  Other Instructions: Call [phone] to cancel the morning of your procedure due         to illness or emergency.  10) We will call within 72 hours to assess how you are feeling.

## 2017-05-30 NOTE — Anesthesia Procedure Notes (Signed)
Date/Time: 05/30/2017 10:25 AM Performed by: Dionne Bucy Pre-anesthesia Checklist: Patient identified, Emergency Drugs available, Suction available and Patient being monitored Patient Re-evaluated:Patient Re-evaluated prior to induction Oxygen Delivery Method: Circle system utilized Preoxygenation: Pre-oxygenation with 100% oxygen Induction Type: IV induction Ventilation: Mask ventilation without difficulty and Mask ventilation throughout procedure Airway Equipment and Method: Bite block Placement Confirmation: positive ETCO2 Dental Injury: Teeth and Oropharynx as per pre-operative assessment

## 2017-06-01 ENCOUNTER — Telehealth: Payer: Self-pay | Admitting: *Deleted

## 2017-06-01 ENCOUNTER — Other Ambulatory Visit: Payer: Self-pay | Admitting: Psychiatry

## 2017-06-04 ENCOUNTER — Ambulatory Visit: Payer: Self-pay | Admitting: Certified Registered"

## 2017-06-04 ENCOUNTER — Encounter: Payer: Self-pay | Admitting: Certified Registered"

## 2017-06-04 ENCOUNTER — Encounter
Admission: RE | Admit: 2017-06-04 | Discharge: 2017-06-04 | Disposition: A | Discharge status: Home / Self Care | Payer: Medicare HMO | Source: Ambulatory Visit | Attending: Psychiatry | Admitting: Psychiatry

## 2017-06-04 DIAGNOSIS — F419 Anxiety disorder, unspecified: Secondary | ICD-10-CM | POA: Diagnosis not present

## 2017-06-04 DIAGNOSIS — E039 Hypothyroidism, unspecified: Secondary | ICD-10-CM | POA: Diagnosis not present

## 2017-06-04 DIAGNOSIS — M797 Fibromyalgia: Secondary | ICD-10-CM | POA: Insufficient documentation

## 2017-06-04 DIAGNOSIS — R69 Illness, unspecified: Secondary | ICD-10-CM | POA: Diagnosis not present

## 2017-06-04 DIAGNOSIS — F339 Major depressive disorder, recurrent, unspecified: Secondary | ICD-10-CM | POA: Insufficient documentation

## 2017-06-04 DIAGNOSIS — M069 Rheumatoid arthritis, unspecified: Secondary | ICD-10-CM | POA: Insufficient documentation

## 2017-06-04 DIAGNOSIS — M1990 Unspecified osteoarthritis, unspecified site: Secondary | ICD-10-CM | POA: Diagnosis not present

## 2017-06-04 DIAGNOSIS — F332 Major depressive disorder, recurrent severe without psychotic features: Secondary | ICD-10-CM

## 2017-06-04 MED ORDER — SODIUM CHLORIDE 0.9 % IV SOLN
INTRAVENOUS | Status: DC | PRN
  Administered 2017-06-04: 10:00:00 via INTRAVENOUS

## 2017-06-04 MED ORDER — METHOHEXITAL SODIUM 100 MG/10ML IV SOSY
PREFILLED_SYRINGE | INTRAVENOUS | Status: DC | PRN
  Administered 2017-06-04: 100 mg via INTRAVENOUS

## 2017-06-04 MED ORDER — ONDANSETRON HCL 4 MG/2ML IJ SOLN
INTRAMUSCULAR | Status: AC
  Administered 2017-06-04: 4 mg via INTRAVENOUS
  Filled 2017-06-04: qty 2

## 2017-06-04 MED ORDER — SUCCINYLCHOLINE CHLORIDE 20 MG/ML IJ SOLN
INTRAMUSCULAR | Status: DC | PRN
  Administered 2017-06-04: 140 mg via INTRAVENOUS

## 2017-06-04 MED ORDER — LACTATED RINGERS IV SOLN: INTRAVENOUS | Status: DC

## 2017-06-04 MED ORDER — KETOROLAC TROMETHAMINE 30 MG/ML IJ SOLN: 30.0000 mg | Freq: Once | INTRAMUSCULAR | Status: DC

## 2017-06-04 MED ORDER — SODIUM CHLORIDE 0.9 % IV SOLN
500.0000 mL | Freq: Once | INTRAVENOUS | Status: AC
  Administered 2017-06-04: 500 mL via INTRAVENOUS

## 2017-06-04 MED ORDER — KETOROLAC TROMETHAMINE 60 MG/2ML IM SOLN
INTRAMUSCULAR | Status: AC
  Administered 2017-06-04: 60 mg
  Filled 2017-06-04: qty 2

## 2017-06-04 MED ORDER — ONDANSETRON HCL 4 MG/2ML IJ SOLN
4.0000 mg | Freq: Once | INTRAMUSCULAR | Status: AC
  Administered 2017-06-04: 4 mg via INTRAVENOUS

## 2017-06-04 NOTE — Anesthesia Post-op Follow-up Note (Signed)
Anesthesia QCDR form completed.        

## 2017-06-04 NOTE — Anesthesia Postprocedure Evaluation (Signed)
Anesthesia Post Note  Patient: Jamie Bennett  Procedure(s) Performed: ECT TX  Patient location during evaluation: PACU Anesthesia Type: General Level of consciousness: awake and alert Pain management: pain level controlled Vital Signs Assessment: post-procedure vital signs reviewed and stable Respiratory status: spontaneous breathing, nonlabored ventilation and respiratory function stable Cardiovascular status: blood pressure returned to baseline and stable Postop Assessment: no signs of nausea or vomiting Anesthetic complications: no     Last Vitals:  Vitals:   06/04/17 1206 06/04/17 1215  BP: (!) 112/56 125/60  Pulse: 71 72  Resp: 14 18  Temp: 36.6 C   SpO2: 99%     Last Pain:  Vitals:   06/04/17 1215  TempSrc: Oral  PainSc:                  Ulani Degrasse

## 2017-06-04 NOTE — Anesthesia Preprocedure Evaluation (Signed)
Anesthesia Evaluation  Patient identified by MRN, date of birth, ID band Patient awake    Reviewed: Allergy & Precautions, NPO status , Patient's Chart, lab work & pertinent test results  History of Anesthesia Complications (+) PONV and history of anesthetic complications  Airway Mallampati: III  TM Distance: >3 FB Neck ROM: Full    Dental no notable dental hx.    Pulmonary neg pulmonary ROS, neg sleep apnea, neg COPD,    breath sounds clear to auscultation- rhonchi (-) wheezing      Cardiovascular Exercise Tolerance: Good (-) hypertension(-) CAD, (-) Past MI and (-) Cardiac Stents  Rhythm:Regular Rate:Normal - Systolic murmurs and - Diastolic murmurs    Neuro/Psych PSYCHIATRIC DISORDERS Anxiety Bipolar Disorder negative neurological ROS     GI/Hepatic negative GI ROS, Neg liver ROS,   Endo/Other  neg diabetesHypothyroidism   Renal/GU negative Renal ROS     Musculoskeletal  (+) Arthritis , Fibromyalgia -  Abdominal (+) + obese,   Peds  Hematology  (+) anemia ,   Anesthesia Other Findings Past Medical History: No date: Anemia No date: Anxiety No date: Bipolar 1 disorder (HCC) No date: Collagen vascular disease ([place])     Comment:  rhematoid arthritis No date: Constipation No date: Fibromyalgia No date: Hypotension No date: Hypothyroidism No date: Osteoarthritis No date: Osteoporosis No date: PONV (postoperative nausea and vomiting)     Comment:  nausea only No date: Rheumatoid arthritis (HCC) No date: Thyroid disease   Reproductive/Obstetrics                             Anesthesia Physical Anesthesia Plan  ASA: III  Anesthesia Plan: General   Post-op Pain Management:    Induction: Intravenous  PONV Risk Score and Plan: 3 and Ondansetron  Airway Management Planned: Mask  Additional Equipment:   Intra-op Plan:   Post-operative Plan:   Informed Consent: I have  reviewed the patients History and Physical, chart, labs and discussed the procedure including the risks, benefits and alternatives for the proposed anesthesia with the patient or authorized representative who has indicated his/her understanding and acceptance.   Dental advisory given  Plan Discussed with: CRNA and Anesthesiologist  Anesthesia Plan Comments:         Anesthesia Quick Evaluation

## 2017-06-04 NOTE — Anesthesia Postprocedure Evaluation (Signed)
Anesthesia Post Note  Patient: Jamie Bennett  Procedure(s) Performed: ECT TX  Patient location during evaluation: PACU Anesthesia Type: General Level of consciousness: awake and alert Pain management: pain level controlled Vital Signs Assessment: post-procedure vital signs reviewed and stable Respiratory status: spontaneous breathing, nonlabored ventilation, respiratory function stable and patient connected to nasal cannula oxygen Cardiovascular status: blood pressure returned to baseline and stable Postop Assessment: no apparent nausea or vomiting Anesthetic complications: no     Last Vitals:  Vitals:   05/30/17 1050 05/30/17 1103  BP: 107/85 107/85  Pulse: 73 73  Resp:  (!) 21  Temp: 36.6 C 36.6 C  SpO2: 98%     Last Pain:  Vitals:   05/30/17 1103  TempSrc: Oral  PainSc:                  Molli Barrows

## 2017-06-04 NOTE — H&P (Signed)
Jamie Bennett is an 56 y.o. female.   Chief Complaint: Continues to have a ringing sort of sound in the ear.  No other new physical complaints.  Mood not much better HPI: History of recurrent depression seeking improvement with ECT treatment  Past Medical History:  Diagnosis Date  . Anemia   . Anxiety   . Bipolar 1 disorder ([place])   . Collagen vascular disease ([place])    rhematoid arthritis  . Constipation   . Fibromyalgia   . Hypotension   . Hypothyroidism   . Osteoarthritis   . Osteoporosis   . PONV (postoperative nausea and vomiting)    nausea only  . Rheumatoid arthritis ([place])   . Thyroid disease     Past Surgical History:  Procedure Laterality Date  . CHOLECYSTECTOMY    . GASTRIC BYPASS    . TONSILLECTOMY      Family History  Problem Relation Age of Onset  . Depression Mother   . Dementia Mother   . Heart disease Mother   . Parkinson's disease Father    Social History:  reports that  has never smoked. she has never used smokeless tobacco. She reports that she does not drink alcohol or use drugs.  Allergies:  Allergies  Allergen Reactions  . Lactose Intolerance (Gi) Other (See Comments)    Bloating and GI distress     (Not in a hospital admission)  No results found for this or any previous visit (from the past 48 hour(s)). No results found.  Review of Systems  Constitutional: Negative.   HENT: Negative.   Eyes: Negative.   Respiratory: Negative.   Cardiovascular: Negative.   Gastrointestinal: Negative.   Musculoskeletal: Negative.   Skin: Negative.   Neurological: Negative.   Psychiatric/Behavioral: Positive for depression and memory loss. Negative for hallucinations, substance abuse and suicidal ideas. The patient is nervous/anxious. The patient does not have insomnia.     Blood pressure (!) 158/97, pulse (!) 102, temperature (!) 97 F (36.1 C), temperature source Oral, resp. rate 19, height 5\' 5"  (1.651 m), weight (!) 142 kg (313 lb), SpO2 98  %. Physical Exam  Nursing note and vitals reviewed. Constitutional: She appears well-developed and well-nourished.  HENT:  Head: Normocephalic and atraumatic.  Eyes: Conjunctivae are normal. Pupils are equal, round, and reactive to light.  Neck: Normal range of motion.  Cardiovascular: Regular rhythm and normal heart sounds.  Respiratory: Effort normal. No respiratory distress.  GI: Soft.  Musculoskeletal: Normal range of motion.  Neurological: She is alert.  Skin: Skin is warm and dry.  Psychiatric: Judgment normal. Her affect is blunt. Her speech is delayed. She is slowed. Thought content is not paranoid. Cognition and memory are normal. She expresses no homicidal and no suicidal ideation.     Assessment/Plan Treatment #5 today.  Little improvement so far.  We will still have her on the schedule for Wednesday but then reassess efficacy.  Alethia Berthold, MD 06/04/2017, 11:30 AM

## 2017-06-04 NOTE — Discharge Instructions (Signed)
1)  The drugs that you have been given will stay in your system until tomorrow so for the       next 24 hours you should not:  A. Drive an automobile  B. Make any legal decisions  C. Drink any alcoholic beverages  2)  You may resume your regular meals upon return home.  3)  A responsible adult must take you home.  Someone should stay with you for a few          hours, then be available by phone for the remainder of the treatment day.  4)  You May experience any of the following symptoms:  Headache, Nausea and a dry mouth (due to the medications you were given),  temporary memory loss and some confusion, or sore muscles (a warm bath  should help this).  If you you experience any of these symptoms let us know on                your return visit.  5)  Report any of the following: any acute discomfort, severe headache, or temperature        greater than 100.5 F.   Also report any unusual redness, swelling, drainage, or pain         at your IV site.    You may report Symptoms to:  [place] [facility]          Phone: [phone], ECT Department           or Dr. Prescott Gum office [phone]  6)  Your next ECT Treatment is November 7, Wednesday at 9:00  We will call 2 days prior to your scheduled appointment for arrival times.  7)  Nothing to eat or drink after midnight the night before your procedure.  8)  Take     With a sip of water the morning of your procedure.  9)  Other Instructions: Call [phone] to cancel the morning of your procedure due         to illness or emergency.  10) We will call within 72 hours to assess how you are feeling.

## 2017-06-04 NOTE — Transfer of Care (Signed)
Immediate Anesthesia Transfer of Care Note  Patient: Jamie Bennett  Procedure(s) Performed: ECT TX  Patient Location: PACU  Anesthesia Type:General  Level of Consciousness: sedated  Airway & Oxygen Therapy: Patient Spontanous Breathing and Patient connected to face mask oxygen  Post-op Assessment: Report given to RN and Post -op Vital signs reviewed and stable  Post vital signs: Reviewed and stable  Last Vitals:  Vitals:   06/04/17 0914 06/04/17 1146  BP: (!) 158/97 128/74  Pulse: (!) 102 79  Resp: 19 15  Temp: (!) 36.1 C   SpO2: 98% 95%    Last Pain:  Vitals:   06/04/17 0914  TempSrc: Oral         Complications: No apparent anesthesia complications

## 2017-06-04 NOTE — Procedures (Signed)
ECT SERVICES Physician's Interval Evaluation & Treatment Note  Patient Identification: Jamie Bennett MRN:  [id number] Date of Evaluation:  06/04/2017 TX #: 5  MADRS:   MMSE:   P.E. Findings:  No change to physical exam  Psychiatric Interval Note:  Mental status exam and psychiatric complaints about the same  Subjective:  Patient is a 56 y.o. female seen for evaluation for Electroconvulsive Therapy. Still having some ringing in the ear  Treatment Summary:   []   Right Unilateral             [x]  Bilateral   % Energy : 1.0 ms 40%   Impedance: 2440 ohms  Seizure Energy Index: 27,476 V squared  Postictal Suppression Index: None identified  Seizure Concordance Index: 98% Zofran 4 mg Toradol 30 mg Brevital 100 mg succinylcholine 140 mg  Medications  Pre Shock: Zofran 4 mg Toradol 30 mg Brevital 100 mg succinylcholine 140 Miller  Post Shock:    Seizure Duration: 36 seconds by EMG 44 seconds by EEG   Comments: Follow-up Wednesday  Lungs:  [x]   Clear to auscultation               []  Other:   Heart:    [x]   Regular rhythm             []  irregular rhythm    [x]   Previous H&P reviewed, patient examined and there are NO CHANGES                 []   Previous H&P reviewed, patient examined and there are changes noted.   Alethia Berthold, MD 11/5/201811:32 AM

## 2017-06-05 ENCOUNTER — Other Ambulatory Visit: Payer: Self-pay | Admitting: Psychiatry

## 2017-06-05 DIAGNOSIS — Z1231 Encounter for screening mammogram for malignant neoplasm of breast: Secondary | ICD-10-CM | POA: Diagnosis not present

## 2017-06-06 ENCOUNTER — Encounter
Admission: RE | Admit: 2017-06-06 | Discharge: 2017-06-06 | Disposition: A | Discharge status: Home / Self Care | Payer: Medicare HMO | Source: Ambulatory Visit | Attending: Psychiatry | Admitting: Psychiatry

## 2017-06-06 ENCOUNTER — Encounter: Payer: Self-pay | Admitting: Anesthesiology

## 2017-06-06 DIAGNOSIS — E039 Hypothyroidism, unspecified: Secondary | ICD-10-CM | POA: Insufficient documentation

## 2017-06-06 DIAGNOSIS — M797 Fibromyalgia: Secondary | ICD-10-CM | POA: Insufficient documentation

## 2017-06-06 DIAGNOSIS — F332 Major depressive disorder, recurrent severe without psychotic features: Secondary | ICD-10-CM | POA: Insufficient documentation

## 2017-06-06 DIAGNOSIS — F419 Anxiety disorder, unspecified: Secondary | ICD-10-CM | POA: Diagnosis not present

## 2017-06-06 DIAGNOSIS — M069 Rheumatoid arthritis, unspecified: Secondary | ICD-10-CM | POA: Diagnosis not present

## 2017-06-06 DIAGNOSIS — R69 Illness, unspecified: Secondary | ICD-10-CM | POA: Diagnosis not present

## 2017-06-06 MED ORDER — SODIUM CHLORIDE 0.9 % IV SOLN
500.0000 mL | Freq: Once | INTRAVENOUS | Status: AC
  Administered 2017-06-06: 500 mL via INTRAVENOUS

## 2017-06-06 MED ORDER — SODIUM CHLORIDE 0.9 % IV SOLN
INTRAVENOUS | Status: DC | PRN
  Administered 2017-06-06: 10:00:00 via INTRAVENOUS

## 2017-06-06 MED ORDER — KETOROLAC TROMETHAMINE 30 MG/ML IJ SOLN
30.0000 mg | Freq: Once | INTRAMUSCULAR | Status: AC
  Administered 2017-06-06: 30 mg via INTRAVENOUS

## 2017-06-06 MED ORDER — SUCCINYLCHOLINE CHLORIDE 20 MG/ML IJ SOLN
INTRAMUSCULAR | Status: AC
  Filled 2017-06-06: qty 1

## 2017-06-06 MED ORDER — KETOROLAC TROMETHAMINE 30 MG/ML IJ SOLN
INTRAMUSCULAR | Status: AC
  Administered 2017-06-06: 30 mg via INTRAVENOUS
  Filled 2017-06-06: qty 1

## 2017-06-06 MED ORDER — ONDANSETRON HCL 4 MG/2ML IJ SOLN
4.0000 mg | Freq: Once | INTRAMUSCULAR | Status: AC
  Administered 2017-06-06: 4 mg via INTRAVENOUS

## 2017-06-06 MED ORDER — SUCCINYLCHOLINE CHLORIDE 200 MG/10ML IV SOSY
PREFILLED_SYRINGE | INTRAVENOUS | Status: DC | PRN
  Administered 2017-06-06: 140 mg via INTRAVENOUS

## 2017-06-06 MED ORDER — ONDANSETRON HCL 4 MG/2ML IJ SOLN: 4.0000 mg | Freq: Once | INTRAMUSCULAR | Status: DC | PRN

## 2017-06-06 MED ORDER — METHOHEXITAL SODIUM 100 MG/10ML IV SOSY
PREFILLED_SYRINGE | INTRAVENOUS | Status: DC | PRN
  Administered 2017-06-06: 100 mg via INTRAVENOUS

## 2017-06-06 MED ORDER — ONDANSETRON HCL 4 MG/2ML IJ SOLN
INTRAMUSCULAR | Status: AC
  Administered 2017-06-06: 4 mg via INTRAVENOUS
  Filled 2017-06-06: qty 2

## 2017-06-06 MED ORDER — FENTANYL CITRATE (PF) 100 MCG/2ML IJ SOLN: 25.0000 ug | INTRAMUSCULAR | Status: DC | PRN

## 2017-06-06 NOTE — Anesthesia Procedure Notes (Signed)
Date/Time: 06/06/2017 11:54 AM Performed by: Dionne Bucy, CRNA Pre-anesthesia Checklist: Patient identified, Emergency Drugs available, Suction available and Patient being monitored Patient Re-evaluated:Patient Re-evaluated prior to induction Oxygen Delivery Method: Circle system utilized Preoxygenation: Pre-oxygenation with 100% oxygen Induction Type: IV induction Ventilation: Mask ventilation without difficulty and Mask ventilation throughout procedure Airway Equipment and Method: Bite block Placement Confirmation: positive ETCO2 Dental Injury: Teeth and Oropharynx as per pre-operative assessment

## 2017-06-06 NOTE — Procedures (Signed)
ECT SERVICES Physician's Interval Evaluation & Treatment Note  Patient Identification: Jamie Bennett MRN:  [id number] Date of Evaluation:  06/06/2017 TX #: 6  MADRS:   MMSE:   P.E. Findings:  No change physical exam mood not significantly different.  Some memory impairment.  Psychiatric Interval Note:  Mood not significantly different some memory impairment  Subjective:  Patient is a 56 y.o. female seen for evaluation for Electroconvulsive Therapy. Still having a sound in her right ear.  Treatment Summary:   []   Right Unilateral             [x]  Bilateral   % Energy : 1.0 ms 40%   Impedance: 1830 ohms  Seizure Energy Index: 25,620 V squared  Postictal Suppression Index: 86%  Seizure Concordance Index: 93%  Medications  Pre Shock: Zofran 4 mg, Toradol 30 mg, Brevital 100 mg, succinylcholine 140 mg  Post Shock:    Seizure Duration: EMG 29 seconds, EEG 41 seconds   Comments: Follow-up Friday at which time we will reassess whether further treatment makes sense  Lungs:  [x]   Clear to auscultation               []  Other:   Heart:    [x]   Regular rhythm             []  irregular rhythm    [x]   Previous H&P reviewed, patient examined and there are NO CHANGES                 []   Previous H&P reviewed, patient examined and there are changes noted.   Alethia Berthold, MD 11/7/201811:48 AM

## 2017-06-06 NOTE — H&P (Signed)
Jamie Bennett is an 56 y.o. female.   Chief Complaint: Patient is not feeling much better.  Still has a ringing in her right ear.  Mood is feeling still anxious and dysphoric without much improvement.  Not suicidal not psychotic. HPI: Recurrent severe depression without significant long-standing stability from medication seeking improvement in depressive symptoms  Past Medical History:  Diagnosis Date  . Anemia   . Anxiety   . Bipolar 1 disorder ([place])   . Collagen vascular disease ([place])    rhematoid arthritis  . Constipation   . Fibromyalgia   . Hypotension   . Hypothyroidism   . Osteoarthritis   . Osteoporosis   . PONV (postoperative nausea and vomiting)    nausea only  . Rheumatoid arthritis ([place])   . Thyroid disease     Past Surgical History:  Procedure Laterality Date  . CHOLECYSTECTOMY    . GASTRIC BYPASS    . TONSILLECTOMY      Family History  Problem Relation Age of Onset  . Depression Mother   . Dementia Mother   . Heart disease Mother   . Parkinson's disease Father    Social History:  reports that  has never smoked. she has never used smokeless tobacco. She reports that she does not drink alcohol or use drugs.  Allergies:  Allergies  Allergen Reactions  . Lactose Intolerance (Gi) Other (See Comments)    Bloating and GI distress     (Not in a hospital admission)  No results found for this or any previous visit (from the past 48 hour(s)). No results found.  Review of Systems  Constitutional: Negative.   HENT: Positive for tinnitus.   Eyes: Negative.   Respiratory: Negative.   Cardiovascular: Negative.   Gastrointestinal: Negative.   Musculoskeletal: Negative.   Skin: Negative.   Neurological: Negative.   Psychiatric/Behavioral: Positive for depression and memory loss. Negative for hallucinations, substance abuse and suicidal ideas. The patient is not nervous/anxious and does not have insomnia.     Blood pressure 127/81, pulse 73,  temperature (!) 97.1 F (36.2 C), temperature source Oral, height 5\' 5"  (1.651 m), weight (!) 140.6 kg (310 lb), SpO2 98 %. Physical Exam  Nursing note and vitals reviewed. Constitutional: She appears well-developed and well-nourished.  HENT:  Head: Normocephalic and atraumatic.  Eyes: Conjunctivae are normal. Pupils are equal, round, and reactive to light.  Neck: Normal range of motion.  Cardiovascular: Regular rhythm and normal heart sounds.  Respiratory: Effort normal and breath sounds normal. No respiratory distress.  GI: Soft.  Musculoskeletal: Normal range of motion.  Neurological: She is alert.  Skin: Skin is warm and dry.  Psychiatric: Judgment normal. Her mood appears anxious. Her speech is delayed. She is slowed. Thought content is not paranoid. Cognition and memory are normal. She exhibits a depressed mood. She expresses no homicidal and no suicidal ideation.     Assessment/Plan Patient is on her sixth ECT treatment and so far has seen little improvement while having some side effects.  We discussed pros and cons of the plan and agreed to go ahead with treatment today but if she feels no better on Friday we will probably consider discontinuing the treatment course.  Alethia Berthold, MD 06/06/2017, 9:49 AM

## 2017-06-06 NOTE — Anesthesia Post-op Follow-up Note (Signed)
Anesthesia QCDR form completed.        

## 2017-06-06 NOTE — Discharge Instructions (Signed)
1)  The drugs that you have been given will stay in your system until tomorrow so for the       next 24 hours you should not:  A. Drive an automobile  B. Make any legal decisions  C. Drink any alcoholic beverages  2)  You may resume your regular meals upon return home.  3)  A responsible adult must take you home.  Someone should stay with you for a few          hours, then be available by phone for the remainder of the treatment day.  4)  You May experience any of the following symptoms:  Headache, Nausea and a dry mouth (due to the medications you were given),  temporary memory loss and some confusion, or sore muscles (a warm bath  should help this).  If you you experience any of these symptoms let us know on                your return visit.  5)  Report any of the following: any acute discomfort, severe headache, or temperature        greater than 100.5 F.   Also report any unusual redness, swelling, drainage, or pain         at your IV site.    You may report Symptoms to:  [place] at [facility]          Phone: [phone], ECT Department           or Dr. Prescott Gum office [phone]  6)  Your next ECT Treatment is Day Friday  Date June 08, 2017  We will call 2 days prior to your scheduled appointment for arrival times.  7)  Nothing to eat or drink after midnight the night before your procedure.  8)  Take .  With a sip of water the morning of your procedure.  9)  Other Instructions: Call [phone] to cancel the morning of your procedure due         to illness or emergency.  10) We will call within 72 hours to assess how you are feeling.

## 2017-06-06 NOTE — Anesthesia Preprocedure Evaluation (Addendum)
Anesthesia Evaluation  Patient identified by MRN, date of birth, ID band Patient awake    Reviewed: Allergy & Precautions, NPO status , Patient's Chart, lab work & pertinent test results, reviewed documented beta blocker date and time   History of Anesthesia Complications (+) PONV and history of anesthetic complications  Airway Mallampati: III  TM Distance: >3 FB     Dental  (+) Chipped   Pulmonary neg pulmonary ROS, neg sleep apnea, neg COPD,    breath sounds clear to auscultation- rhonchi (-) wheezing      Cardiovascular Exercise Tolerance: Good (-) hypertension(-) CAD, (-) Past MI and (-) Cardiac Stents  Rhythm:Regular Rate:Normal - Systolic murmurs and - Diastolic murmurs    Neuro/Psych PSYCHIATRIC DISORDERS Anxiety Bipolar Disorder  Neuromuscular disease    GI/Hepatic negative GI ROS, Neg liver ROS,   Endo/Other  neg diabetesHypothyroidism   Renal/GU negative Renal ROS     Musculoskeletal  (+) Arthritis , Fibromyalgia -  Abdominal   Peds  Hematology  (+) anemia ,   Anesthesia Other Findings Past Medical History: No date: Anemia No date: Anxiety No date: Bipolar 1 disorder ([facility]) No date: Collagen vascular disease ([place])     Comment:  rhematoid arthritis No date: Constipation No date: Fibromyalgia No date: Hypotension No date: Hypothyroidism No date: Osteoarthritis No date: Osteoporosis No date: PONV (postoperative nausea and vomiting)     Comment:  nausea only No date: Rheumatoid arthritis ([facility]) No date: Thyroid disease   Reproductive/Obstetrics                            Anesthesia Physical Anesthesia Plan  ASA: III  Anesthesia Plan: General   Post-op Pain Management:    Induction: Intravenous  PONV Risk Score and Plan: 3 and Ondansetron  Airway Management Planned: Mask  Additional Equipment:   Intra-op Plan:   Post-operative Plan:   Informed Consent: I have  reviewed the patients History and Physical, chart, labs and discussed the procedure including the risks, benefits and alternatives for the proposed anesthesia with the patient or authorized representative who has indicated his/her understanding and acceptance.     Plan Discussed with: CRNA and Anesthesiologist  Anesthesia Plan Comments:        Anesthesia Quick Evaluation

## 2017-06-06 NOTE — Transfer of Care (Signed)
Immediate Anesthesia Transfer of Care Note  Patient: Jamie Bennett  Procedure(s) Performed: ECT TX  Patient Location: PACU  Anesthesia Type:General  Level of Consciousness: sedated  Airway & Oxygen Therapy: Patient Spontanous Breathing and Patient connected to face mask oxygen  Post-op Assessment: Report given to RN and Post -op Vital signs reviewed and stable  Post vital signs: Reviewed and stable  Last Vitals:  Vitals:   06/06/17 0930 06/06/17 1203  BP: 127/81 135/77  Pulse: 73 77  Resp:  16  Temp: (!) 36.2 C 36.4 C  SpO2: 98% 97%    Last Pain:  Vitals:   06/06/17 1203  TempSrc:   PainSc: 0-No pain         Complications: No apparent anesthesia complications

## 2017-06-06 NOTE — Anesthesia Postprocedure Evaluation (Signed)
Anesthesia Post Note  Patient: ADELINE PETITFRERE  Procedure(s) Performed: ECT TX  Patient location during evaluation: PACU Anesthesia Type: General Level of consciousness: awake and alert Pain management: pain level controlled Vital Signs Assessment: post-procedure vital signs reviewed and stable Respiratory status: spontaneous breathing, nonlabored ventilation and respiratory function stable Cardiovascular status: blood pressure returned to baseline and stable Postop Assessment: no signs of nausea or vomiting Anesthetic complications: no     Last Vitals:  Vitals:   06/06/17 1230 06/06/17 1241  BP: 118/90 115/85  Pulse: 69 72  Resp: 16 16  Temp:    SpO2: 98%     Last Pain:  Vitals:   06/06/17 1230  TempSrc:   PainSc: 0-No pain                 Renardo Cheatum

## 2017-06-07 ENCOUNTER — Other Ambulatory Visit: Payer: Self-pay | Admitting: Psychiatry

## 2017-06-07 DIAGNOSIS — G8929 Other chronic pain: Secondary | ICD-10-CM | POA: Diagnosis not present

## 2017-06-07 DIAGNOSIS — M25551 Pain in right hip: Secondary | ICD-10-CM | POA: Diagnosis not present

## 2017-06-08 ENCOUNTER — Encounter
Admission: RE | Admit: 2017-06-08 | Discharge: 2017-06-08 | Disposition: A | Discharge status: Home / Self Care | Payer: Medicare HMO | Source: Ambulatory Visit | Attending: Psychiatry | Admitting: Psychiatry

## 2017-06-08 DIAGNOSIS — F339 Major depressive disorder, recurrent, unspecified: Secondary | ICD-10-CM | POA: Diagnosis not present

## 2017-06-08 MED ORDER — KETOROLAC TROMETHAMINE 30 MG/ML IJ SOLN: 30.0000 mg | Freq: Once | INTRAMUSCULAR | Status: DC

## 2017-06-08 MED ORDER — SODIUM CHLORIDE 0.9 % IV SOLN: 500.0000 mL | Freq: Once | INTRAVENOUS | Status: DC

## 2017-06-08 NOTE — Discharge Instructions (Signed)
1)  The drugs that you have been given will stay in your system until tomorrow so for the       next 24 hours you should not:  A. Drive an automobile  B. Make any legal decisions  C. Drink any alcoholic beverages  2)  You may resume your regular meals upon return home.  3)  A responsible adult must take you home.  Someone should stay with you for a few          hours, then be available by phone for the remainder of the treatment day.  4)  You May experience any of the following symptoms:  Headache, Nausea and a dry mouth (due to the medications you were given),  temporary memory loss and some confusion, or sore muscles (a warm bath  should help this).  If you you experience any of these symptoms let us know on                your return visit.  5)  Report any of the following: any acute discomfort, severe headache, or temperature        greater than 100.5 F.   Also report any unusual redness, swelling, drainage, or pain         at your IV site.    You may report Symptoms to:  [place] at [facility]          Phone: [phone], ECT Department           or Dr. Prescott Gum office [phone]  6)  You no longer have an appointment date  We will call 2 days prior to your scheduled appointment for arrival times.  7)  Nothing to eat or drink after midnight the night before your procedure.  8)  Take  With a sip of water the morning of your procedure.  9)  Other Instructions: Call [phone] to cancel the morning of your procedure due         to illness or emergency.  10) We will call within 72 hours to assess how you are feeling.   IF THE RINGING IN YOUR EARS CONTINUES PLEASE CONTACT YOUR PRIMARY CARE PHYSICIAN OR AN ENT DOCTOR

## 2017-06-11 ENCOUNTER — Inpatient Hospital Stay: Payer: Medicare HMO | Attending: Hematology and Oncology

## 2017-06-11 DIAGNOSIS — E538 Deficiency of other specified B group vitamins: Secondary | ICD-10-CM | POA: Diagnosis present

## 2017-06-11 DIAGNOSIS — Z79899 Other long term (current) drug therapy: Secondary | ICD-10-CM | POA: Diagnosis not present

## 2017-06-11 MED ORDER — CYANOCOBALAMIN 1000 MCG/ML IJ SOLN
1000.0000 ug | Freq: Once | INTRAMUSCULAR | Status: AC
  Administered 2017-06-11: 1000 ug via INTRAMUSCULAR

## 2017-06-18 ENCOUNTER — Other Ambulatory Visit (HOSPITAL_COMMUNITY): Payer: Self-pay | Admitting: Psychiatry

## 2017-06-18 DIAGNOSIS — G8929 Other chronic pain: Secondary | ICD-10-CM | POA: Diagnosis not present

## 2017-06-18 DIAGNOSIS — M25551 Pain in right hip: Secondary | ICD-10-CM | POA: Diagnosis not present

## 2017-06-19 ENCOUNTER — Other Ambulatory Visit (HOSPITAL_COMMUNITY): Payer: Self-pay | Admitting: Psychiatry

## 2017-06-19 DIAGNOSIS — F341 Dysthymic disorder: Secondary | ICD-10-CM

## 2017-06-19 MED ORDER — CLONAZEPAM 0.5 MG PO TABS: 0.5000 mg | ORAL_TABLET | Freq: Two times a day (BID) | ORAL | 0 refills | Status: DC

## 2017-06-19 NOTE — Telephone Encounter (Signed)
I called her and left VM.

## 2017-06-19 NOTE — Telephone Encounter (Signed)
Medication management - Patient left a message stating Dr. Daron Offer tried to return her call earlier today but "called the wrong number" and she missed the call.  Requests he call back at [phone] to speak with him about concerns.

## 2017-06-19 NOTE — Telephone Encounter (Signed)
All set, I spoke with her and sent some medicine to help her anxiety in the meantime

## 2017-06-19 NOTE — Progress Notes (Signed)
Spoke with patient regarding worsening mood and anxious distress.  She is scheduled to see writer in 2 weeks, and wonders about something to help calm her nervousness down more immediately.  Husband is on the phone as well.  No concerns about acute safety issues.  They will be traveling to visit family for Thanksgiving.  Patient has tried husband's Xanax in the past with good effect.  I educated her that clonazepam tends to be more gentle and have a longer half-life, but we can start 0.5 mg twice daily and to discuss further at follow-up.  Will send clonazepam 0.5 mg twice daily to patient's pharmacy Follow-up with this writer in 2 weeks as scheduled

## 2017-06-19 NOTE — Telephone Encounter (Signed)
Medication management - Patient called back stating Dr. Daron Offer told her he has sent in a new Klonopin order but her [facility] is telling her they do not have the order.  Agreed to call to follow-up as Dr. Daron Offer documented the medication was called in.  Called patient's [facility] in [place] to verify they did have the order Dr.Eksir called in earlier today for patient's Klonopin and called patient to inform this was called in and being filled at this time.

## 2017-07-04 DIAGNOSIS — M810 Age-related osteoporosis without current pathological fracture: Secondary | ICD-10-CM | POA: Diagnosis not present

## 2017-07-04 DIAGNOSIS — M25569 Pain in unspecified knee: Secondary | ICD-10-CM | POA: Diagnosis not present

## 2017-07-04 DIAGNOSIS — M064 Inflammatory polyarthropathy: Secondary | ICD-10-CM | POA: Diagnosis not present

## 2017-07-10 ENCOUNTER — Encounter (HOSPITAL_COMMUNITY): Payer: Self-pay | Admitting: Psychiatry

## 2017-07-10 ENCOUNTER — Ambulatory Visit (HOSPITAL_COMMUNITY): Payer: Medicare HMO | Admitting: Psychiatry

## 2017-07-10 DIAGNOSIS — R45851 Suicidal ideations: Secondary | ICD-10-CM

## 2017-07-10 DIAGNOSIS — Z818 Family history of other mental and behavioral disorders: Secondary | ICD-10-CM

## 2017-07-10 DIAGNOSIS — Z7289 Other problems related to lifestyle: Secondary | ICD-10-CM | POA: Diagnosis not present

## 2017-07-10 DIAGNOSIS — F411 Generalized anxiety disorder: Secondary | ICD-10-CM

## 2017-07-10 DIAGNOSIS — F332 Major depressive disorder, recurrent severe without psychotic features: Secondary | ICD-10-CM

## 2017-07-10 DIAGNOSIS — Z810 Family history of intellectual disabilities: Secondary | ICD-10-CM | POA: Diagnosis not present

## 2017-07-10 DIAGNOSIS — R4589 Other symptoms and signs involving emotional state: Secondary | ICD-10-CM

## 2017-07-10 DIAGNOSIS — F603 Borderline personality disorder: Secondary | ICD-10-CM | POA: Diagnosis not present

## 2017-07-10 DIAGNOSIS — R69 Illness, unspecified: Secondary | ICD-10-CM | POA: Diagnosis not present

## 2017-07-10 DIAGNOSIS — F314 Bipolar disorder, current episode depressed, severe, without psychotic features: Secondary | ICD-10-CM | POA: Diagnosis not present

## 2017-07-10 MED ORDER — LAMOTRIGINE 150 MG PO TABS: 150.0000 mg | ORAL_TABLET | Freq: Two times a day (BID) | ORAL | 1 refills | Status: DC

## 2017-07-10 MED ORDER — TRAZODONE HCL 100 MG PO TABS: ORAL_TABLET | ORAL | 1 refills | Status: DC

## 2017-07-10 MED ORDER — BUSPIRONE HCL 30 MG PO TABS: 30.0000 mg | ORAL_TABLET | Freq: Two times a day (BID) | ORAL | 3 refills | Status: DC

## 2017-07-10 MED ORDER — VENLAFAXINE HCL 75 MG PO TABS: 225.0000 mg | ORAL_TABLET | Freq: Every morning | ORAL | 1 refills | Status: DC

## 2017-07-10 MED ORDER — CLONAZEPAM 0.5 MG PO TABS: 0.5000 mg | ORAL_TABLET | Freq: Two times a day (BID) | ORAL | 2 refills | Status: DC

## 2017-07-10 NOTE — Patient Instructions (Addendum)
Increase Buspar to 30 mg TWICE day   Call [phone] [email or web address] [address] [place], [place] [address] They provide individual and group DBT   Also check out [facility]  [facility]  [address] [place], [place] [address] Phone [phone]; Fax [phone]

## 2017-07-10 NOTE — Progress Notes (Signed)
BH MD/PA/NP OP Progress Note  07/10/2017 2:58 PM Jamie Bennett  MRN:  [id number]  Chief Complaint: med management  HPI: Jamie Bennett presents with her husband for med management follow-up.  We spent time reviewing her response to ECT.  She continues to struggle with significant frustration tolerance, dysphoria, mood lability.  She reports that the clonazepam has been partially helpful.  Her husband controls the medications given her past history of overdose.  She continues to focus on Provigil as the thing that would help her to feel better.  We had a frank conversation once again about the diagnosis of borderline personality disorder, black and white thinking, delusional thinking related to her past.  Spent time discussing the reality that if Provigil was so effective for her mood, she would not have ended up overdosing on it.  We also spent time discussing the value of dialectical behavioral therapy, and the limitations of medication management.  I educated her on the risks of benzodiazepines, and the potential of increasing the risk of dementia and falls.  I provided her to referrals for DBT, and we agreed to continue clonazepam as we uptitrate BuSpar.   She continues on Effexor, Lamictal, trazodone unchanged.  We agreed to follow-up in 3-4 months, and she will reach out to clinics for DBT and I will put a referral in for couples therapy so that she and her husband can continue to educate themselves on ways to support one another and there are mental health needs.  I spent time with the patient and her husband instilling hope, and encouraging them.  Patient was actually quite receptive to the education provided, and reports that she feels far more hopeful that she has some element of control in getting help for this.  Visit Diagnosis:    ICD-10-CM   1. Anxiety state F41.1 busPIRone (BUSPAR) 30 MG tablet    clonazePAM (KLONOPIN) 0.5 MG tablet    Ambulatory referral to Psychology  2.  Borderline personality disorder ([place]) F60.3   3. Deliberate self-cutting Z72.89   4. Suicidal ideations R45.851   5. Severe bipolar I disorder, current or most recent episode depressed ([place]) F31.4   6. Difficulty coping R45.89   7. Severe recurrent major depression without psychotic features ([place]) F33.2     Past Psychiatric History: See intake H&P for full details. Reviewed, with no updates at this time.   Past Medical History:  Past Medical History:  Diagnosis Date  . Anemia   . Anxiety   . Bipolar 1 disorder ([place])   . Collagen vascular disease ([place])    rhematoid arthritis  . Constipation   . Fibromyalgia   . Hypotension   . Hypothyroidism   . Osteoarthritis   . Osteoporosis   . PONV (postoperative nausea and vomiting)    nausea only  . Rheumatoid arthritis ([place])   . Thyroid disease     Past Surgical History:  Procedure Laterality Date  . CHOLECYSTECTOMY    . GASTRIC BYPASS    . TONSILLECTOMY      Family Psychiatric History: See intake H&P for full details. Reviewed, with no updates at this time.   Family History:  Family History  Problem Relation Age of Onset  . Depression Mother   . Dementia Mother   . Heart disease Mother   . Parkinson's disease Father     Social History:  Social History   Socioeconomic History  . Marital status: Married    Spouse name: Not on file  .  Number of children: Not on file  . Years of education: Not on file  . Highest education level: Not on file  Social Needs  . Financial resource strain: Not on file  . Food insecurity - worry: Not on file  . Food insecurity - inability: Not on file  . Transportation needs - medical: Not on file  . Transportation needs - non-medical: Not on file  Occupational History  . Not on file  Tobacco Use  . Smoking status: Never Smoker  . Smokeless tobacco: Never Used  Substance and Sexual Activity  . Alcohol use: No    Alcohol/week: 0.0 oz  . Drug use: No  . Sexual activity: No  Other  Topics Concern  . Not on file  Social History Narrative  . Not on file    Allergies:  Allergies  Allergen Reactions  . Lactose Intolerance (Gi) Other (See Comments)    Bloating and GI distress    Metabolic Disorder Labs: No results found for: HGBA1C, MPG No results found for: PROLACTIN Lab Results  Component Value Date   TRIG 176 (H) 11/09/2015   Lab Results  Component Value Date   TSH 0.147 (L) 11/11/2015   TSH 0.354 (L) 01/28/2014    Therapeutic Level Labs: No results found for: LITHIUM No results found for: VALPROATE No components found for:  CBMZ  Current Medications: Current Outpatient Medications  Medication Sig Dispense Refill  . aspirin EC 81 MG tablet Take 81 mg by mouth daily.    . busPIRone (BUSPAR) 30 MG tablet Take 1 tablet (30 mg total) by mouth 2 (two) times daily. 60 tablet 3  . Cholecalciferol (HM VITAMIN D3) 4000 units CAPS Take 4,000 capsules by mouth daily.    . clonazePAM (KLONOPIN) 0.5 MG tablet Take 1 tablet (0.5 mg total) by mouth 2 (two) times daily. 60 tablet 2  . Coenzyme Q-10 200 MG CAPS Take 200 mg by mouth daily.    . cyanocobalamin (,VITAMIN B-12,) 1000 MCG/ML injection Inject 1,000 mcg into the muscle every 30 (thirty) days.    Marland Kitchen docusate calcium (SURFAK) 240 MG capsule Take by mouth.    . esomeprazole (NEXIUM) 40 MG capsule Take 1 capsule (40 mg total) by mouth daily at 12 noon.    . Fiber, Guar Gum, CHEW Chew 10 mg by mouth daily.    Marland Kitchen gabapentin (NEURONTIN) 300 MG capsule Take 300 mg by mouth 2 (two) times daily.    . hydroxychloroquine (PLAQUENIL) 200 MG tablet Take 1 tablet by mouth twice a day Monday through Friday 120 tablet 0  . ibuprofen (ADVIL,MOTRIN) 200 MG tablet Take 400 mg by mouth every 4 (four) hours as needed.    Javier Docker Oil 500 MG CAPS Take 500 mg by mouth daily.    Marland Kitchen lamoTRIgine (LAMICTAL) 150 MG tablet Take 1 tablet (150 mg total) by mouth 2 (two) times daily. 90 tablet 1  . levothyroxine (SYNTHROID, LEVOTHROID) 112  MCG tablet Take 112 mcg by mouth daily.    Marland Kitchen loratadine (CLARITIN) 10 MG tablet 10 mg  prn as needed    . Magnesium 400 MG CAPS Take 400 mg by mouth 2 (two) times daily.     . Multiple Vitamin (MULTIVITAMIN WITH MINERALS) TABS tablet Take 1 tablet by mouth daily.    Marland Kitchen tiZANidine (ZANAFLEX) 4 MG tablet Take 4 mg by mouth 2 (two) times daily as needed.     . traZODone (DESYREL) 100 MG tablet Take 2 tablet at night as needed  for insomnia 90 tablet 1  . venlafaxine (EFFEXOR) 75 MG tablet Take 3 tablets (225 mg total) by mouth every morning. 270 tablet 1  . zoledronic acid (RECLAST) 5 MG/100ML SOLN injection Inject 5 mg into the vein See admin instructions. Patient takes yearly     No current facility-administered medications for this visit.      Musculoskeletal: Strength & Muscle Tone: within normal limits Gait & Station: normal Patient leans: N/A  Psychiatric Specialty Exam: ROS  There were no vitals taken for this visit.There is no height or weight on file to calculate BMI.  General Appearance: Casual and Fairly Groomed  Eye Contact:  Fair  Speech:  Clear and Coherent  Volume:  Normal  Mood:  Anxious  Affect:  Congruent  Thought Process:  Goal Directed and Descriptions of Associations: Intact  Orientation:  Full (Time, Place, and Person)  Thought Content: Logical   Suicidal Thoughts:  No  Homicidal Thoughts:  No  Memory:  Immediate;   Good I spent time  Judgement:  Fair  Insight:  Shallow  Psychomotor Activity:  Normal  Concentration:  Attention Span: Fair  Recall:  [employer] of Knowledge: Fair  Language: Fair  Akathisia:  Negative  Handed:  Right  AIMS (if indicated): not done  Assets:  Communication Skills Desire for Improvement Financial Resources/Insurance Housing Intimacy Transportation  ADL's:  Intact  Cognition: WNL  Sleep:  Good   Screenings: AUDIT     Admission (Discharged) from 02/26/2014 in [facility]  Alcohol Use  Disorder Identification Test Final Score (AUDIT)  0    ECT-MADRS     ECT Treatment from 06/08/2017 in [place] ECT Treatment from 05/30/2017 in [place] ECT Treatment from 05/23/2017 in [place] Total Score  20  22  38    GAD-7     Counselor from 03/08/2017 in [place] Counselor from 02/26/2017 in [place]  Total GAD-7 Score  7  8    Mini-Mental     ECT Treatment from 06/08/2017 in [place] ECT Treatment from 05/30/2017 in [place] ECT Treatment from 05/23/2017 in [place]  Total Score (max 30 points )  30  30  30     PHQ2-9     Counselor from 03/08/2017 in [place] Counselor from 02/26/2017 in [place]  PHQ-2 Total Score  5  6  PHQ-9 Total Score  16  21       Assessment and Plan:  KAYDANCE BOWIE presents for follow-up after treatment with ECT.  I spoke with her on the phone 1-2 weeks ago because she was having increased anxiety.  Clonazepam was effective in reducing immediate anxious distress and we agreed to continue and reviewed the risks and benefits of neurocognitive disorder.  The patient has had many psychiatrists over the years, and has yet to reach a consistent interval of time where she had mood stability and absence of suicidal thoughts.  I educated the patient on borderline personality disorder, and spent time instilling hope and teaching her about borderline personality disorder, which she was quite receptive to.  Her husband was also quite receptive, and they both did a nice job of pointing out areas of mood lability and black and white thinking that  the patient struggles with.  They would like couples  therapy to help align them further, and I have provided them with referrals for DBT.  1. Anxiety state   2. Borderline personality disorder ([place])   3. Deliberate self-cutting   4. Suicidal ideations   5. Severe bipolar I disorder, current or most recent episode depressed ([place])   6. Difficulty coping   7. Severe recurrent major depression without psychotic features ([place])     Status of current problems: unchanged  Labs Ordered: Orders Placed This Encounter  Procedures  . Ambulatory referral to Psychology    Referral Priority:   Routine    Referral Type:   Psychiatric    Referral Reason:   Specialty Services Required    Requested Specialty:   Psychology    Number of Visits Requested:   1    Labs Reviewed: n/a  Collateral Obtained/Records Reviewed:husband as above  Plan:  Continue clonazepam 0.5 mg twice daily, or 1 mg nightly Continue BuSpar, increase to 30 mg twice daily Continue Effexor immediate release 225 mg in the morning Continue trazodone 100-200 mg nightly Continue Lamictal 150 mg twice daily RTC 12-14 weeks Referral for DBT Referral for couples therapy  I spent 30 minutes with the patient in direct face-to-face clinical care.  Greater than 50% of this time was spent in counseling and coordination of care with the patient.    Aundra Dubin, MD 07/10/2017, 2:58 PM

## 2017-07-11 ENCOUNTER — Other Ambulatory Visit: Payer: Self-pay | Admitting: Rheumatology

## 2017-07-11 ENCOUNTER — Other Ambulatory Visit: Payer: Self-pay | Admitting: Nurse Practitioner

## 2017-07-11 DIAGNOSIS — Z780 Asymptomatic menopausal state: Secondary | ICD-10-CM | POA: Diagnosis not present

## 2017-07-11 DIAGNOSIS — Z7983 Long term (current) use of bisphosphonates: Secondary | ICD-10-CM | POA: Diagnosis not present

## 2017-07-11 DIAGNOSIS — M810 Age-related osteoporosis without current pathological fracture: Secondary | ICD-10-CM | POA: Diagnosis not present

## 2017-07-11 DIAGNOSIS — Z8739 Personal history of other diseases of the musculoskeletal system and connective tissue: Secondary | ICD-10-CM | POA: Diagnosis not present

## 2017-07-11 DIAGNOSIS — M8580 Other specified disorders of bone density and structure, unspecified site: Secondary | ICD-10-CM | POA: Diagnosis not present

## 2017-07-11 NOTE — Telephone Encounter (Signed)
Last visit: 05/02/17 Next visit: 10/16/17 Labs: 04/12/17 CBC WNL PLQ Eye Exam 03/2017 WNL  Okay to refill per Dr. Estanislado Pandy

## 2017-07-12 ENCOUNTER — Inpatient Hospital Stay: Payer: Medicare HMO

## 2017-07-12 ENCOUNTER — Inpatient Hospital Stay: Payer: Medicare HMO | Attending: Hematology and Oncology

## 2017-07-12 ENCOUNTER — Ambulatory Visit: Payer: Self-pay | Admitting: Hematology and Oncology

## 2017-07-12 DIAGNOSIS — E538 Deficiency of other specified B group vitamins: Secondary | ICD-10-CM | POA: Diagnosis not present

## 2017-07-12 DIAGNOSIS — Z79899 Other long term (current) drug therapy: Secondary | ICD-10-CM | POA: Insufficient documentation

## 2017-07-12 LAB — COMPREHENSIVE METABOLIC PANEL
ALT: 18 IU/L (ref 0–32)
AST: 22 IU/L (ref 0–40)
Albumin/Globulin Ratio: 1.8 (ref 1.2–2.2)
Albumin: 4.4 g/dL (ref 3.5–5.5)
Alkaline Phosphatase: 164 IU/L — ABNORMAL HIGH (ref 39–117)
BUN/Creatinine Ratio: 18 (ref 9–23)
BUN: 17 mg/dL (ref 6–24)
Bilirubin Total: <0.2 mg/dL (ref 0.0–1.2)
CO2: 22 mmol/L (ref 20–29)
Calcium: 9 mg/dL (ref 8.7–10.2)
Chloride: 105 mmol/L (ref 96–106)
Creatinine, Ser: 0.94 mg/dL (ref 0.57–1.00)
GFR calc Af Amer: 78 mL/min/{1.73_m2} (ref >59)
GFR calc non Af Amer: 68 mL/min/{1.73_m2} (ref >59)
Globulin, Total: 2.5 g/dL (ref 1.5–4.5)
Glucose: 102 mg/dL — ABNORMAL HIGH (ref 65–99)
Potassium: 4.7 mmol/L (ref 3.5–5.2)
Sodium: 141 mmol/L (ref 134–144)
Total Protein: 6.9 g/dL (ref 6.0–8.5)

## 2017-07-12 LAB — CBC WITH DIFFERENTIAL/PLATELET
Basophils Absolute: 0 10*3/uL (ref 0–0.1)
Basophils Relative: 0 %
Eosinophils Absolute: 0.1 10*3/uL (ref 0–0.7)
Eosinophils Relative: 2 %
HCT: 38.5 % (ref 35.0–47.0)
Hemoglobin: 12.7 g/dL (ref 12.0–16.0)
Lymphocytes Relative: 33 %
Lymphs Abs: 1.9 10*3/uL (ref 1.0–3.6)
MCH: 30.1 pg (ref 26.0–34.0)
MCHC: 33 g/dL (ref 32.0–36.0)
MCV: 91.1 fL (ref 80.0–100.0)
Monocytes Absolute: 0.4 10*3/uL (ref 0.2–0.9)
Monocytes Relative: 7 %
Neutro Abs: 3.3 10*3/uL (ref 1.4–6.5)
Neutrophils Relative %: 58 %
Platelets: 256 10*3/uL (ref 150–440)
RBC: 4.23 MIL/uL (ref 3.80–5.20)
RDW: 13.8 % (ref 11.5–14.5)
WBC: 5.7 10*3/uL (ref 3.6–11.0)

## 2017-07-12 LAB — FERRITIN: Ferritin: 24 ng/mL (ref 11–307)

## 2017-07-12 LAB — VITAMIN D 25 HYDROXY (VIT D DEFICIENCY, FRACTURES): Vit D, 25-Hydroxy: 51.1 ng/mL (ref 30.0–100.0)

## 2017-07-12 MED ORDER — CYANOCOBALAMIN 1000 MCG/ML IJ SOLN
1000.0000 ug | Freq: Once | INTRAMUSCULAR | Status: AC
  Administered 2017-07-12: 1000 ug via INTRAMUSCULAR

## 2017-07-16 ENCOUNTER — Ambulatory Visit: Payer: Medicare HMO | Admitting: Anesthesiology

## 2017-07-16 ENCOUNTER — Encounter
Admission: RE | Disposition: A | Discharge status: Home / Self Care | Payer: Self-pay | Source: Ambulatory Visit | Attending: Unknown Physician Specialty

## 2017-07-16 ENCOUNTER — Ambulatory Visit
Admission: RE | Admit: 2017-07-16 | Discharge: 2017-07-16 | Disposition: A | Discharge status: Home / Self Care | Payer: Medicare HMO | Source: Ambulatory Visit | Attending: Unknown Physician Specialty | Admitting: Unknown Physician Specialty

## 2017-07-16 ENCOUNTER — Encounter: Payer: Self-pay | Admitting: *Deleted

## 2017-07-16 DIAGNOSIS — Z1211 Encounter for screening for malignant neoplasm of colon: Secondary | ICD-10-CM | POA: Insufficient documentation

## 2017-07-16 DIAGNOSIS — Z7982 Long term (current) use of aspirin: Secondary | ICD-10-CM | POA: Diagnosis not present

## 2017-07-16 DIAGNOSIS — F319 Bipolar disorder, unspecified: Secondary | ICD-10-CM | POA: Diagnosis not present

## 2017-07-16 DIAGNOSIS — Z79899 Other long term (current) drug therapy: Secondary | ICD-10-CM | POA: Insufficient documentation

## 2017-07-16 DIAGNOSIS — K648 Other hemorrhoids: Secondary | ICD-10-CM | POA: Diagnosis not present

## 2017-07-16 DIAGNOSIS — F419 Anxiety disorder, unspecified: Secondary | ICD-10-CM | POA: Diagnosis not present

## 2017-07-16 DIAGNOSIS — E039 Hypothyroidism, unspecified: Secondary | ICD-10-CM | POA: Insufficient documentation

## 2017-07-16 DIAGNOSIS — M797 Fibromyalgia: Secondary | ICD-10-CM | POA: Insufficient documentation

## 2017-07-16 DIAGNOSIS — M1612 Unilateral primary osteoarthritis, left hip: Secondary | ICD-10-CM | POA: Diagnosis not present

## 2017-07-16 DIAGNOSIS — K640 First degree hemorrhoids: Secondary | ICD-10-CM | POA: Diagnosis not present

## 2017-07-16 DIAGNOSIS — Z9884 Bariatric surgery status: Secondary | ICD-10-CM | POA: Insufficient documentation

## 2017-07-16 DIAGNOSIS — M170 Bilateral primary osteoarthritis of knee: Secondary | ICD-10-CM | POA: Diagnosis not present

## 2017-07-16 DIAGNOSIS — R69 Illness, unspecified: Secondary | ICD-10-CM | POA: Diagnosis not present

## 2017-07-16 DIAGNOSIS — M069 Rheumatoid arthritis, unspecified: Secondary | ICD-10-CM | POA: Insufficient documentation

## 2017-07-16 DIAGNOSIS — Z538 Procedure and treatment not carried out for other reasons: Secondary | ICD-10-CM | POA: Diagnosis not present

## 2017-07-16 HISTORY — DX: Opioid abuse, uncomplicated: F11.10

## 2017-07-16 HISTORY — DX: Fatty (change of) liver, not elsewhere classified: K76.0

## 2017-07-16 HISTORY — PX: COLONOSCOPY: SHX5424

## 2017-07-16 HISTORY — DX: Bariatric surgery status: Z98.84

## 2017-07-16 HISTORY — DX: Rheumatic fever without heart involvement: I00

## 2017-07-16 SURGERY — COLONOSCOPY
Anesthesia: General

## 2017-07-16 MED ORDER — MIDAZOLAM HCL 2 MG/2ML IJ SOLN
INTRAMUSCULAR | Status: AC
  Filled 2017-07-16: qty 2

## 2017-07-16 MED ORDER — SODIUM CHLORIDE 0.9 % IV SOLN: INTRAVENOUS | Status: DC

## 2017-07-16 MED ORDER — EPHEDRINE SULFATE 50 MG/ML IJ SOLN
INTRAMUSCULAR | Status: DC | PRN
  Administered 2017-07-16: 10 mg via INTRAVENOUS
  Administered 2017-07-16: 15 mg via INTRAVENOUS

## 2017-07-16 MED ORDER — MIDAZOLAM HCL 5 MG/5ML IJ SOLN
INTRAMUSCULAR | Status: DC | PRN
  Administered 2017-07-16 (×2): 1 mg via INTRAVENOUS
  Administered 2017-07-16: 2 mg via INTRAVENOUS

## 2017-07-16 MED ORDER — FENTANYL CITRATE (PF) 100 MCG/2ML IJ SOLN
INTRAMUSCULAR | Status: AC
  Filled 2017-07-16: qty 2

## 2017-07-16 MED ORDER — EPHEDRINE SULFATE 50 MG/ML IJ SOLN
INTRAMUSCULAR | Status: AC
  Filled 2017-07-16: qty 1

## 2017-07-16 MED ORDER — PHENYLEPHRINE HCL 10 MG/ML IJ SOLN
INTRAMUSCULAR | Status: AC
  Filled 2017-07-16: qty 1

## 2017-07-16 MED ORDER — PROPOFOL 500 MG/50ML IV EMUL
INTRAVENOUS | Status: DC | PRN
  Administered 2017-07-16: 75 ug/kg/min via INTRAVENOUS

## 2017-07-16 MED ORDER — SODIUM CHLORIDE 0.9 % IV SOLN
INTRAVENOUS | Status: DC
  Administered 2017-07-16: 1000 mL via INTRAVENOUS

## 2017-07-16 MED ORDER — PROPOFOL 500 MG/50ML IV EMUL
INTRAVENOUS | Status: AC
  Filled 2017-07-16: qty 50

## 2017-07-16 MED ORDER — FENTANYL CITRATE (PF) 100 MCG/2ML IJ SOLN
INTRAMUSCULAR | Status: DC | PRN
  Administered 2017-07-16: 25 ug via INTRAVENOUS
  Administered 2017-07-16: 50 ug via INTRAVENOUS
  Administered 2017-07-16: 25 ug via INTRAVENOUS

## 2017-07-16 MED ORDER — LIDOCAINE HCL (PF) 2 % IJ SOLN
INTRAMUSCULAR | Status: DC | PRN
  Administered 2017-07-16: 100 mg

## 2017-07-16 MED ORDER — PROPOFOL 10 MG/ML IV BOLUS
INTRAVENOUS | Status: DC | PRN
  Administered 2017-07-16 (×2): 20 mg via INTRAVENOUS
  Administered 2017-07-16 (×2): 10 mg via INTRAVENOUS

## 2017-07-16 NOTE — Transfer of Care (Signed)
Immediate Anesthesia Transfer of Care Note  Patient: Jamie Bennett  Procedure(s) Performed: COLONOSCOPY (N/A )  Patient Location: PACU  Anesthesia Type:General  Level of Consciousness: sedated  Airway & Oxygen Therapy: Patient Spontanous Breathing and Patient connected to nasal cannula oxygen  Post-op Assessment: Report given to RN and Post -op Vital signs reviewed and stable  Post vital signs: Reviewed and stable  Last Vitals:  Vitals:   07/16/17 0804  BP: 111/83  Pulse: 77  Resp: 20  Temp: (!) 36.1 C  SpO2: 98%    Last Pain:  Vitals:   07/16/17 0804  TempSrc: Tympanic         Complications: No apparent anesthesia complications

## 2017-07-16 NOTE — Op Note (Signed)
[facility] Gastroenterology Patient Name: Jamie Bennett Procedure Date: 07/16/2017 9:25 AM MRN: [id number] Account #: [id number] Date of Birth: Aug 11, 1960 Admit Type: Outpatient Age: 56 Room: [facility] ENDO ROOM 3 Gender: Female Note Status: Finalized Procedure:            Colonoscopy Indications:          Screening for colorectal malignant neoplasm Providers:            Manya Silvas, MD Referring MD:         Lavera Guise, MD (Referring MD) Medicines:            Propofol per Anesthesia Complications:        No immediate complications. Procedure:            Pre-Anesthesia Assessment:                       - After reviewing the risks and benefits, the patient                        was deemed in satisfactory condition to undergo the                        procedure.                       After obtaining informed consent, the colonoscope was                        passed under direct vision. Throughout the procedure,                        the patient's blood pressure, pulse, and oxygen                        saturations were monitored continuously. The                        Colonoscope was introduced through the anus and                        advanced to the the hepatic flexure. The colonoscopy                        was performed with moderate difficulty due to                        significant looping and a tortuous colon. Successful                        completion of the procedure was aided by applying                        abdominal pressure. The patient tolerated the procedure                        well. The quality of the bowel preparation was good. Findings:      Internal hemorrhoids were found during endoscopy. The hemorrhoids were       small and Grade I (internal hemorrhoids that do not prolapse).      The scope was passed to the hepatic flexure but  despite multiple efforts       unable to pass beyond this point. About 5 trials of pushing  the scope       and pulling back with different positions was attempted but could never       pass the turn without running out of scope. Exam showed no polyps       anywhere in the colon. Impression:           - Internal hemorrhoids.                       - No specimens collected. Recommendation:       - The findings and recommendations were discussed with                        the patient's family. Recommend virtual colonoscopy or                        CT colonography. Manya Silvas, MD 07/16/2017 10:12:33 AM This report has been signed electronically. Number of Addenda: 0 Note Initiated On: 07/16/2017 9:25 AM Total Procedure Duration: 0 hours 31 minutes 3 seconds       [facility]

## 2017-07-16 NOTE — Brief Op Note (Signed)
Colonoscopy complete to proximal transverse colon

## 2017-07-16 NOTE — H&P (Signed)
Primary Care Physician:  Lavera Guise, MD Primary Gastroenterologist:  Dr. Vira Agar  Pre-Procedure History & Physical: HPI:  Jamie Bennett is a 56 y.o. female is here for an colonoscopy.   Past Medical History:  Diagnosis Date  . Anemia   . Anxiety   . Bipolar 1 disorder ([place])   . Collagen vascular disease ([place])    rhematoid arthritis  . Constipation   . Fibromyalgia   . Hepatic steatosis   . History of Roux-en-Y gastric bypass   . Hypotension   . Hypothyroidism   . Opioid abuse ([place])   . Osteoarthritis   . Osteoporosis   . PONV (postoperative nausea and vomiting)    nausea only  . Rheumatic fever   . Rheumatoid arthritis ([place])   . Thyroid disease     Past Surgical History:  Procedure Laterality Date  . CHOLECYSTECTOMY    . GASTRIC BYPASS    . TONSILLECTOMY      Prior to Admission medications   Medication Sig Start Date End Date Taking? Authorizing Provider  aspirin EC 81 MG tablet Take 81 mg by mouth daily.    [provider]  busPIRone (BUSPAR) 30 MG tablet Take 1 tablet (30 mg total) by mouth 2 (two) times daily. 07/10/17 07/10/18  Aundra Dubin, MD  Cholecalciferol (HM VITAMIN D3) 4000 units CAPS Take 4,000 capsules by mouth daily.    [provider]  clonazePAM (KLONOPIN) 0.5 MG tablet Take 1 tablet (0.5 mg total) by mouth 2 (two) times daily. 07/10/17 07/10/18  Aundra Dubin, MD  Coenzyme Q-10 200 MG CAPS Take 200 mg by mouth daily.    [provider]  cyanocobalamin (,VITAMIN B-12,) 1000 MCG/ML injection Inject 1,000 mcg into the muscle every 30 (thirty) days.    [provider]  docusate calcium (SURFAK) 240 MG capsule Take by mouth.    [provider]  esomeprazole (NEXIUM) 40 MG capsule Take 1 capsule (40 mg total) by mouth daily at 12 noon. 03/04/14   [place], Elyse Jarvis, FNP  Fiber, Guar Gum, CHEW Chew 10 mg by mouth daily.    [provider]  hydroxychloroquine (PLAQUENIL) 200 MG  tablet Take 1 tablet by mouth twice a day Monday through Friday 07/11/17   Bo Merino, MD  ibuprofen (ADVIL,MOTRIN) 200 MG tablet Take 400 mg by mouth every 4 (four) hours as needed.    [provider]  Javier Docker Oil 500 MG CAPS Take 500 mg by mouth daily.    [provider]  lamoTRIgine (LAMICTAL) 150 MG tablet Take 1 tablet (150 mg total) by mouth 2 (two) times daily. 07/10/17   Aundra Dubin, MD  levothyroxine (SYNTHROID, LEVOTHROID) 112 MCG tablet Take 112 mcg by mouth daily. 07/06/15   [provider]  loratadine (CLARITIN) 10 MG tablet 10 mg  prn as needed 09/12/10   [provider]  Magnesium 400 MG CAPS Take 400 mg by mouth 2 (two) times daily.     [provider]  Multiple Vitamin (MULTIVITAMIN WITH MINERALS) TABS tablet Take 1 tablet by mouth daily.    [provider]  tiZANidine (ZANAFLEX) 4 MG tablet Take 4 mg by mouth 2 (two) times daily as needed.  07/03/16   [provider]  traZODone (DESYREL) 100 MG tablet Take 2 tablet at night as needed for insomnia 07/10/17   Daron Offer, Richard Miu, MD  venlafaxine [facility]) 75 MG tablet Take 3 tablets (225 mg total) by mouth every morning.  07/10/17 07/10/18  Aundra Dubin, MD  zoledronic acid (RECLAST) 5 MG/100ML SOLN injection Inject 5 mg into the vein See admin instructions. Patient takes yearly    [provider]    Allergies as of 05/14/2017 - Review Complete 05/11/2017  Allergen Reaction Noted  . Lactose intolerance (gi) Other (See Comments) 05/11/2017    Family History  Problem Relation Age of Onset  . Depression Mother   . Dementia Mother   . Heart disease Mother   . Parkinson's disease Father     Social History   Socioeconomic History  . Marital status: Married    Spouse name: Not on file  . Number of children: Not on file  . Years of education: Not on file  . Highest education level: Not on file  Social Needs  . Financial resource  strain: Not on file  . Food insecurity - worry: Not on file  . Food insecurity - inability: Not on file  . Transportation needs - medical: Not on file  . Transportation needs - non-medical: Not on file  Occupational History  . Not on file  Tobacco Use  . Smoking status: Never Smoker  . Smokeless tobacco: Never Used  Substance and Sexual Activity  . Alcohol use: No    Alcohol/week: 0.0 oz  . Drug use: No  . Sexual activity: No  Other Topics Concern  . Not on file  Social History Narrative  . Not on file    Review of Systems: See HPI, otherwise negative ROS  Physical Exam: BP 111/83   Pulse 77   Temp (!) 96.9 F (36.1 C) (Tympanic)   Resp 20   Ht 5\' 6"  (1.676 m)   Wt (!) 140.6 kg (310 lb)   SpO2 98%   BMI 50.04 kg/m  General:   Alert,  pleasant and cooperative in NAD Head:  Normocephalic and atraumatic. Neck:  Supple; no masses or thyromegaly. Lungs:  Clear throughout to auscultation.    Heart:  Regular rate and rhythm. Abdomen:  Soft, nontender and nondistended. Normal bowel sounds, without guarding, and without rebound.   Neurologic:  Alert and  oriented x4;  grossly normal neurologically.  Impression/Plan: Jamie Bennett is here for an colonoscopy to be performed for screening colonoscopy.  Risks, benefits, limitations, and alternatives regarding  colonoscopy have been reviewed with the patient.  Questions have been answered.  All parties agreeable.   Gaylyn Cheers, MD  07/16/2017, 9:30 AM

## 2017-07-16 NOTE — Anesthesia Postprocedure Evaluation (Signed)
Anesthesia Post Note  Patient: Jamie Bennett  Procedure(s) Performed: COLONOSCOPY (N/A )  Patient location during evaluation: PACU Anesthesia Type: General Level of consciousness: awake and alert Pain management: pain level controlled Vital Signs Assessment: post-procedure vital signs reviewed and stable Respiratory status: spontaneous breathing, nonlabored ventilation, respiratory function stable and patient connected to nasal cannula oxygen Cardiovascular status: blood pressure returned to baseline and stable Postop Assessment: no apparent nausea or vomiting Anesthetic complications: no     Last Vitals:  Vitals:   07/16/17 1031 07/16/17 1041  BP: 106/61   Pulse: 69 72  Resp: 20 19  Temp:    SpO2: 100% 100%    Last Pain:  Vitals:   07/16/17 0804  TempSrc: Tympanic                 Molli Barrows

## 2017-07-16 NOTE — Anesthesia Preprocedure Evaluation (Signed)
Anesthesia Evaluation  Patient identified by MRN, date of birth, ID band Patient awake    Reviewed: Allergy & Precautions, H&P , NPO status , Patient's Chart, lab work & pertinent test results, reviewed documented beta blocker date and time   History of Anesthesia Complications (+) PONV and history of anesthetic complications  Airway Mallampati: II   Neck ROM: full    Dental  (+) Poor Dentition   Pulmonary neg pulmonary ROS,    Pulmonary exam normal        Cardiovascular Exercise Tolerance: Poor negative cardio ROS Normal cardiovascular exam Rhythm:regular Rate:Normal     Neuro/Psych PSYCHIATRIC DISORDERS  Neuromuscular disease negative neurological ROS  negative psych ROS   GI/Hepatic negative GI ROS, Neg liver ROS,   Endo/Other  negative endocrine ROSHypothyroidism   Renal/GU Renal diseasenegative Renal ROS  negative genitourinary   Musculoskeletal   Abdominal   Peds  Hematology negative hematology ROS (+) anemia ,   Anesthesia Other Findings Past Medical History: No date: Anemia No date: Anxiety No date: Bipolar 1 disorder ([facility]) No date: Collagen vascular disease ([place])     Comment:  rhematoid arthritis No date: Constipation No date: Fibromyalgia No date: Hepatic steatosis No date: History of Roux-en-Y gastric bypass No date: Hypotension No date: Hypothyroidism No date: Opioid abuse ([place]) No date: Osteoarthritis No date: Osteoporosis No date: PONV (postoperative nausea and vomiting)     Comment:  nausea only No date: Rheumatic fever No date: Rheumatoid arthritis ([place]) No date: Thyroid disease Past Surgical History: No date: CHOLECYSTECTOMY No date: GASTRIC BYPASS No date: TONSILLECTOMY BMI    Body Mass Index:  50.04 kg/m     Reproductive/Obstetrics negative OB ROS                             Anesthesia Physical Anesthesia Plan  ASA: III  Anesthesia Plan: General    Post-op Pain Management:    Induction:   PONV Risk Score and Plan:   Airway Management Planned:   Additional Equipment:   Intra-op Plan:   Post-operative Plan:   Informed Consent: I have reviewed the patients History and Physical, chart, labs and discussed the procedure including the risks, benefits and alternatives for the proposed anesthesia with the patient or authorized representative who has indicated his/her understanding and acceptance.   Dental Advisory Given  Plan Discussed with: CRNA  Anesthesia Plan Comments:         Anesthesia Quick Evaluation

## 2017-07-16 NOTE — Anesthesia Post-op Follow-up Note (Signed)
Anesthesia QCDR form completed.        

## 2017-07-17 ENCOUNTER — Encounter: Payer: Self-pay | Admitting: Unknown Physician Specialty

## 2017-08-07 ENCOUNTER — Other Ambulatory Visit: Payer: Self-pay

## 2017-08-07 ENCOUNTER — Ambulatory Visit (INDEPENDENT_AMBULATORY_CARE_PROVIDER_SITE_OTHER): Payer: Medicare HMO | Admitting: Internal Medicine

## 2017-08-07 ENCOUNTER — Encounter: Payer: Self-pay | Admitting: Internal Medicine

## 2017-08-07 VITALS — BP 145/88 | HR 74 | Temp 97.5°F | Resp 16 | Ht 65.0 in | Wt 332.6 lb

## 2017-08-07 DIAGNOSIS — J0100 Acute maxillary sinusitis, unspecified: Secondary | ICD-10-CM

## 2017-08-07 DIAGNOSIS — J029 Acute pharyngitis, unspecified: Secondary | ICD-10-CM | POA: Diagnosis not present

## 2017-08-07 DIAGNOSIS — R062 Wheezing: Secondary | ICD-10-CM

## 2017-08-07 LAB — POCT RAPID STREP A (OFFICE): Rapid Strep A Screen: NEGATIVE

## 2017-08-07 MED ORDER — ACETAMINOPHEN-CODEINE 300-30 MG PO TABS: ORAL_TABLET | ORAL | 0 refills | Status: DC

## 2017-08-07 MED ORDER — AMOXICILLIN-POT CLAVULANATE 875-125 MG PO TABS: 1.0000 | ORAL_TABLET | Freq: Two times a day (BID) | ORAL | 0 refills | Status: DC

## 2017-08-07 NOTE — Progress Notes (Signed)
[facility] [place], [place] [address]  Internal MEDICINE  Office Visit Note  Patient Name: Jamie Bennett  [id number]  [id number]  Date of Service: 08/07/2017     Complaints/HPI:  Pt is here for a sick visit.  Cough  This is a new problem. The cough is productive of brown sputum. Associated symptoms include chest pain, a sore throat and shortness of breath. Pertinent negatives include no chills, ear pain, eye redness, fever, headaches, myalgias, postnasal drip, rhinorrhea or wheezing. Nothing aggravates the symptoms. She has tried OTC cough suppressant for the symptoms. The treatment provided no relief. There is no history of environmental allergies.     Current Medication:  Outpatient Encounter Medications as of 08/07/2017  Medication Sig Note  . aspirin EC 81 MG tablet Take 81 mg by mouth daily.   . busPIRone (BUSPAR) 30 MG tablet Take 1 tablet (30 mg total) by mouth 2 (two) times daily.   . Cholecalciferol (HM VITAMIN D3) 4000 units CAPS Take 4,000 Units by mouth daily.    . clonazePAM (KLONOPIN) 0.5 MG tablet Take 1 tablet (0.5 mg total) by mouth 2 (two) times daily.   . Coenzyme Q-10 200 MG CAPS Take 200 mg by mouth daily.   . cyanocobalamin (,VITAMIN B-12,) 1000 MCG/ML injection Inject 1,000 mcg into the muscle every 30 (thirty) days.   Marland Kitchen docusate calcium (SURFAK) 240 MG capsule Take by mouth.   . esomeprazole (NEXIUM) 40 MG capsule Take 1 capsule (40 mg total) by mouth daily at 12 noon.   . Fiber, Guar Gum, CHEW Chew 10 mg by mouth daily.   . hydroxychloroquine (PLAQUENIL) 200 MG tablet Take 1 tablet by mouth twice a day Monday through Friday   . ibuprofen (ADVIL,MOTRIN) 200 MG tablet Take 400 mg by mouth every 4 (four) hours as needed.   Javier Docker Oil 500 MG CAPS Take 500 mg by mouth daily.   Marland Kitchen lamoTRIgine (LAMICTAL) 150 MG tablet Take 1 tablet (150 mg total) by mouth 2 (two) times daily.   Marland Kitchen levothyroxine (SYNTHROID, LEVOTHROID) 112 MCG  tablet Take 112 mcg by mouth daily.   Marland Kitchen loratadine (CLARITIN) 10 MG tablet 10 mg  prn as needed 03/02/2017: Takes only as needed.   . Magnesium 400 MG CAPS Take 400 mg by mouth 2 (two) times daily.    . Multiple Vitamin (MULTIVITAMIN WITH MINERALS) TABS tablet Take 1 tablet by mouth daily.   Marland Kitchen tiZANidine (ZANAFLEX) 4 MG tablet Take 4 mg by mouth 2 (two) times daily as needed.  07/12/2016: Received from: External Pharmacy  . traZODone (DESYREL) 100 MG tablet Take 2 tablet at night as needed for insomnia   . venlafaxine (EFFEXOR) 75 MG tablet Take 3 tablets (225 mg total) by mouth every morning.   . zoledronic acid (RECLAST) 5 MG/100ML SOLN injection Inject 5 mg into the vein See admin instructions. Patient takes yearly    No facility-administered encounter medications on file as of 08/07/2017.       Medical History: Past Medical History:  Diagnosis Date  . Anemia   . Anxiety   . Bipolar 1 disorder ([place])   . Collagen vascular disease ([place])    rhematoid arthritis  . Constipation   . Fibromyalgia   . Hepatic steatosis   . History of Roux-en-Y gastric bypass   . Hypotension   . Hypothyroidism   . Opioid abuse ([place])   . Osteoarthritis   . Osteoporosis   . PONV (postoperative nausea  and vomiting)    nausea only  . Rheumatic fever   . Rheumatoid arthritis ([place])   . Thyroid disease      Vital Signs: BP (!) 145/88 (BP Location: Left Arm, Patient Position: Sitting)   Pulse 74   Temp (!) 97.5 F (36.4 C) (Oral)   Resp 16   Ht 5\' 5"  (1.651 m)   Wt (!) 332 lb 9.6 oz (150.9 kg)   SpO2 98%   BMI 55.35 kg/m     Physical Exam  Constitutional: She is oriented to person, place, and time. She appears well-developed and well-nourished. No distress.  HENT:  Head: Normocephalic and atraumatic.  Mouth/Throat: Oropharynx is clear and moist. No oropharyngeal exudate.  Eyes: EOM are normal. Pupils are equal, round, and reactive to light.  Neck: Normal range of motion. Neck supple. No JVD  present. No tracheal deviation present. No thyromegaly present.  Cardiovascular: Normal rate, regular rhythm and normal heart sounds. Exam reveals no gallop and no friction rub.  No murmur heard. Pulmonary/Chest: Effort normal. No respiratory distress. She has no wheezes. She has no rales. She exhibits no tenderness.  Abdominal: Soft. Bowel sounds are normal.  Musculoskeletal: Normal range of motion.  Lymphadenopathy:    She has no cervical adenopathy.  Neurological: She is alert and oriented to person, place, and time. No cranial nerve deficit.  Skin: Skin is warm and dry. She is not diaphoretic.  Psychiatric: She has a normal mood and affect. Her behavior is normal. Judgment and thought content normal.      Assessment/Plan: 1. Sore throat  - POCT rapid strep A 9 NEGATIVE  2. Wheezing without diagnosis of asthma  - PR INHAL RX, AIRWAY OBST/DX SPUTUM INDUCT . Tylenol # 3 bid prn for cough  3. Acute non-recurrent maxillary sinusitis-   - Augmentin bid as precribed today  General Counseling : I have discussed the findings of the evaluation and examination with Jamie Bennett.  I have also discussed any further diagnostic evaluation that may be needed or ordered today. Jamie Bennett verbalizes understanding of the findings of todays visit. We also reviewed her medications today. she has been encouraged to call the office with any questions or concerns that should arise related to todays visit.    Counseling:  - rest and increase po fluids      Time spent:25

## 2017-08-09 ENCOUNTER — Other Ambulatory Visit: Payer: Self-pay | Admitting: Unknown Physician Specialty

## 2017-08-09 DIAGNOSIS — Q438 Other specified congenital malformations of intestine: Secondary | ICD-10-CM

## 2017-08-09 DIAGNOSIS — Z1211 Encounter for screening for malignant neoplasm of colon: Secondary | ICD-10-CM

## 2017-08-13 ENCOUNTER — Inpatient Hospital Stay: Payer: Medicare HMO | Attending: Hematology and Oncology

## 2017-08-13 DIAGNOSIS — E538 Deficiency of other specified B group vitamins: Secondary | ICD-10-CM

## 2017-08-13 DIAGNOSIS — Z79899 Other long term (current) drug therapy: Secondary | ICD-10-CM | POA: Diagnosis not present

## 2017-08-13 MED ORDER — CYANOCOBALAMIN 1000 MCG/ML IJ SOLN
1000.0000 ug | Freq: Once | INTRAMUSCULAR | Status: AC
  Administered 2017-08-13: 1000 ug via INTRAMUSCULAR

## 2017-08-21 ENCOUNTER — Ambulatory Visit
Admission: RE | Admit: 2017-08-21 | Discharge: 2017-08-21 | Disposition: A | Discharge status: Home / Self Care | Payer: Medicare HMO | Source: Ambulatory Visit | Attending: Unknown Physician Specialty | Admitting: Unknown Physician Specialty

## 2017-08-21 DIAGNOSIS — Q438 Other specified congenital malformations of intestine: Secondary | ICD-10-CM

## 2017-08-21 DIAGNOSIS — Z1211 Encounter for screening for malignant neoplasm of colon: Secondary | ICD-10-CM

## 2017-09-05 ENCOUNTER — Other Ambulatory Visit: Payer: Self-pay

## 2017-09-05 DIAGNOSIS — M816 Localized osteoporosis [Lequesne]: Secondary | ICD-10-CM | POA: Diagnosis not present

## 2017-09-05 MED ORDER — TIZANIDINE HCL 4 MG PO TABS: 4.0000 mg | ORAL_TABLET | Freq: Three times a day (TID) | ORAL | 1 refills | Status: DC | PRN

## 2017-09-13 ENCOUNTER — Inpatient Hospital Stay: Payer: Medicare HMO | Attending: Hematology and Oncology

## 2017-09-13 DIAGNOSIS — Z79899 Other long term (current) drug therapy: Secondary | ICD-10-CM | POA: Insufficient documentation

## 2017-09-13 DIAGNOSIS — E538 Deficiency of other specified B group vitamins: Secondary | ICD-10-CM | POA: Insufficient documentation

## 2017-09-13 MED ORDER — CYANOCOBALAMIN 1000 MCG/ML IJ SOLN
1000.0000 ug | Freq: Once | INTRAMUSCULAR | Status: AC
  Administered 2017-09-13: 1000 ug via INTRAMUSCULAR
  Filled 2017-09-13: qty 1

## 2017-10-01 ENCOUNTER — Other Ambulatory Visit: Payer: Self-pay | Admitting: Rheumatology

## 2017-10-01 NOTE — Telephone Encounter (Signed)
Last visit: 05/02/17 Next visit: 10/16/17 Labs: 05/11/17 WNL PLQ Eye Exam 03/2017 WNL  Okay to refill per Dr. Estanislado Pandy

## 2017-10-03 ENCOUNTER — Encounter (HOSPITAL_COMMUNITY): Payer: Self-pay | Admitting: Psychiatry

## 2017-10-03 ENCOUNTER — Ambulatory Visit (INDEPENDENT_AMBULATORY_CARE_PROVIDER_SITE_OTHER): Payer: Medicare HMO | Admitting: Psychiatry

## 2017-10-03 VITALS — BP 132/80 | HR 82 | Ht 65.0 in | Wt 327.8 lb

## 2017-10-03 DIAGNOSIS — Z818 Family history of other mental and behavioral disorders: Secondary | ICD-10-CM | POA: Diagnosis not present

## 2017-10-03 DIAGNOSIS — Z810 Family history of intellectual disabilities: Secondary | ICD-10-CM

## 2017-10-03 DIAGNOSIS — F332 Major depressive disorder, recurrent severe without psychotic features: Secondary | ICD-10-CM | POA: Diagnosis not present

## 2017-10-03 DIAGNOSIS — F603 Borderline personality disorder: Secondary | ICD-10-CM | POA: Diagnosis not present

## 2017-10-03 DIAGNOSIS — F411 Generalized anxiety disorder: Secondary | ICD-10-CM

## 2017-10-03 DIAGNOSIS — R69 Illness, unspecified: Secondary | ICD-10-CM | POA: Diagnosis not present

## 2017-10-03 MED ORDER — VENLAFAXINE HCL 100 MG PO TABS: 100.0000 mg | ORAL_TABLET | Freq: Three times a day (TID) | ORAL | 1 refills | Status: DC

## 2017-10-03 MED ORDER — BUSPIRONE HCL 30 MG PO TABS: 30.0000 mg | ORAL_TABLET | Freq: Two times a day (BID) | ORAL | 0 refills | Status: DC

## 2017-10-03 MED ORDER — LAMOTRIGINE 150 MG PO TABS: 150.0000 mg | ORAL_TABLET | Freq: Two times a day (BID) | ORAL | 1 refills | Status: DC

## 2017-10-03 MED ORDER — HYDROXYZINE HCL 50 MG PO TABS: 50.0000 mg | ORAL_TABLET | Freq: Three times a day (TID) | ORAL | 1 refills | Status: DC | PRN

## 2017-10-03 MED ORDER — TRAZODONE HCL 100 MG PO TABS: 200.0000 mg | ORAL_TABLET | Freq: Every day | ORAL | 1 refills | Status: DC

## 2017-10-03 NOTE — Progress Notes (Signed)
BH MD/PA/NP OP Progress Note  10/03/2017 2:49 PM Jamie Bennett  MRN:  [id number]  Chief Complaint: med management HPI: Jamie Bennett has made efforts to schedule DBT intake, but has been turned away by 2 psychologists who are listed to provide treatment for borderline personality disorder, but in fact reported that they were not comfortable to take on this type of client.  I spent time with her reviewing her commitment to [facility], and she has been quite determined, has coordinated with her insurance company, and has received a list of DBT providers in the [place] and [place] area.  She is going to call some of these providers over the next week to schedule an initial appointment.  She does feel like her mood and anxiety have been easier to manage now that she knows which she is dealing with in terms of her mood disorder.  We discussed an increase in her Effexor to 100 mg 3 times daily to hopefully target some of her difficulties with energy.  She has been using clonazepam very sparingly, and tends to use Vistaril on a more consistent basis 2-3 times a day.  I applauded this effort, and we will follow-up in 8 weeks or sooner if needed.  Visit Diagnosis:    ICD-10-CM   1. Borderline personality disorder ([place]) F60.3 lamoTRIgine (LAMICTAL) 150 MG tablet    traZODone (DESYREL) 100 MG tablet    venlafaxine (EFFEXOR) 100 MG tablet  2. Anxiety state F41.1 busPIRone (BUSPAR) 30 MG tablet    venlafaxine (EFFEXOR) 100 MG tablet    hydrOXYzine (ATARAX/VISTARIL) 50 MG tablet  3. Major depressive disorder, recurrent, severe without psychotic features ([place]) F33.2 lamoTRIgine (LAMICTAL) 150 MG tablet    traZODone (DESYREL) 100 MG tablet    venlafaxine (EFFEXOR) 100 MG tablet    Past Psychiatric History: See intake H&P for full details. Reviewed, with no updates at this time.   Past Medical History:  Past Medical History:  Diagnosis Date  . Anemia   . Anxiety   . Bipolar 1 disorder ([place])    . Collagen vascular disease ([place])    rhematoid arthritis  . Constipation   . Fibromyalgia   . Hepatic steatosis   . History of Roux-en-Y gastric bypass   . Hypotension   . Hypothyroidism   . Opioid abuse ([place])   . Osteoarthritis   . Osteoporosis   . PONV (postoperative nausea and vomiting)    nausea only  . Rheumatic fever   . Rheumatoid arthritis ([place])   . Thyroid disease     Past Surgical History:  Procedure Laterality Date  . CHOLECYSTECTOMY    . COLONOSCOPY N/A 07/16/2017   Procedure: COLONOSCOPY;  Surgeon: Manya Silvas, MD;  [facility] ENDOSCOPY;  Service: Endoscopy;  Laterality: N/A;  . GASTRIC BYPASS    . TONSILLECTOMY      Family Psychiatric History: See intake H&P for full details. Reviewed, with no updates at this time.   Family History:  Family History  Problem Relation Age of Onset  . Depression Mother   . Dementia Mother   . Heart disease Mother   . Parkinson's disease Father     Social History:  Social History   Socioeconomic History  . Marital status: Married    Spouse name: Not on file  . Number of children: Not on file  . Years of education: Not on file  . Highest education level: Not on file  Social Needs  . Financial resource strain: Not  on file  . Food insecurity - worry: Not on file  . Food insecurity - inability: Not on file  . Transportation needs - medical: Not on file  . Transportation needs - non-medical: Not on file  Occupational History  . Not on file  Tobacco Use  . Smoking status: Never Smoker  . Smokeless tobacco: Never Used  Substance and Sexual Activity  . Alcohol use: No    Alcohol/week: 0.0 oz  . Drug use: No  . Sexual activity: No  Other Topics Concern  . Not on file  Social History Narrative  . Not on file    Allergies:  Allergies  Allergen Reactions  . Lactose Intolerance (Gi) Other (See Comments)    Bloating and GI distress    Metabolic Disorder Labs: No results found for: HGBA1C, MPG No  results found for: PROLACTIN Lab Results  Component Value Date   TRIG 176 (H) 11/09/2015   Lab Results  Component Value Date   TSH 0.147 (L) 11/11/2015   TSH 0.354 (L) 01/28/2014    Therapeutic Level Labs: No results found for: LITHIUM No results found for: VALPROATE No components found for:  CBMZ  Current Medications: Current Outpatient Medications  Medication Sig Dispense Refill  . aspirin EC 81 MG tablet Take 81 mg by mouth daily.    . busPIRone (BUSPAR) 30 MG tablet Take 1 tablet (30 mg total) by mouth 2 (two) times daily. 180 tablet 0  . Cholecalciferol (HM VITAMIN D3) 4000 units CAPS Take 4,000 Units by mouth daily.     . clonazePAM (KLONOPIN) 0.5 MG tablet Take 1 tablet (0.5 mg total) by mouth 2 (two) times daily. 60 tablet 2  . Coenzyme Q-10 200 MG CAPS Take 200 mg by mouth daily.    . cyanocobalamin (,VITAMIN B-12,) 1000 MCG/ML injection Inject 1,000 mcg into the muscle every 30 (thirty) days.    Marland Kitchen docusate calcium (SURFAK) 240 MG capsule Take by mouth.    . esomeprazole (NEXIUM) 40 MG capsule Take 1 capsule (40 mg total) by mouth daily at 12 noon.    . Fiber, Guar Gum, CHEW Chew 10 mg by mouth daily.    . hydroxychloroquine (PLAQUENIL) 200 MG tablet Take 1 tablet by mouth twice a day Monday through Friday 120 tablet 0  . ibuprofen (ADVIL,MOTRIN) 200 MG tablet Take 400 mg by mouth every 4 (four) hours as needed.    Javier Docker Oil 500 MG CAPS Take 500 mg by mouth daily.    Marland Kitchen lamoTRIgine (LAMICTAL) 150 MG tablet Take 1 tablet (150 mg total) by mouth 2 (two) times daily. 180 tablet 1  . levothyroxine (SYNTHROID, LEVOTHROID) 112 MCG tablet Take 112 mcg by mouth daily.    Marland Kitchen loratadine (CLARITIN) 10 MG tablet 10 mg  prn as needed    . Magnesium 400 MG CAPS Take 400 mg by mouth 2 (two) times daily.     . Multiple Vitamin (MULTIVITAMIN WITH MINERALS) TABS tablet Take 1 tablet by mouth daily.    Marland Kitchen tiZANidine (ZANAFLEX) 4 MG tablet Take 1 tablet (4 mg total) by mouth 3 (three) times  daily as needed. 90 tablet 1  . traZODone (DESYREL) 100 MG tablet Take 2 tablets (200 mg total) by mouth at bedtime. 180 tablet 1  . venlafaxine (EFFEXOR) 100 MG tablet Take 1 tablet (100 mg total) by mouth 3 (three) times daily with meals. 270 tablet 1  . zoledronic acid (RECLAST) 5 MG/100ML SOLN injection Inject 5 mg into the vein  See admin instructions. Patient takes yearly    . hydrOXYzine (ATARAX/VISTARIL) 50 MG tablet Take 1 tablet (50 mg total) by mouth 3 (three) times daily as needed for anxiety. 270 tablet 1   No current facility-administered medications for this visit.      Musculoskeletal: Strength & Muscle Tone: within normal limits Gait & Station: normal Patient leans: N/A  Psychiatric Specialty Exam: ROS  Blood pressure 132/80, pulse 82, height '5\' 5"'  (1.651 m), weight (!) 327 lb 12.8 oz (148.7 kg).Body mass index is 54.55 kg/m.  General Appearance: Casual and Fairly Groomed  Eye Contact:  Fair  Speech:  Clear and Coherent and Normal Rate  Volume:  Normal  Mood:  Dysphoric  Affect:  Appropriate and Congruent  Thought Process:  Goal Directed and Descriptions of Associations: Intact  Orientation:  Full (Time, Place, and Person)  Thought Content: Logical   Suicidal Thoughts:  No  Homicidal Thoughts:  No  Memory:  Immediate;   Fair  Judgement:  Fair  Insight:  Shallow  Psychomotor Activity:  Normal  Concentration:  Concentration: Fair  Recall:  [employer] of Knowledge: Fair  Language: Fair  Akathisia:  Negative  Handed:  Right  AIMS (if indicated): not done  Assets:  Communication Skills Desire for Improvement Financial Resources/Insurance Housing Intimacy Transportation  ADL's:  Intact  Cognition: WNL  Sleep:  Fair   Screenings: AUDIT     Admission (Discharged) from 02/26/2014 in [facility]  Alcohol Use Disorder Identification Test Final Score (AUDIT)  0    ECT-MADRS     ECT Treatment from 06/08/2017 in [place] ECT Treatment from 05/30/2017 in [facility] ECT Treatment from 05/23/2017 in [facility]  MADRS Total Score  20  22  38    GAD-7     Counselor from 03/08/2017 in [facility] Counselor from 02/26/2017 in [facility]  Total GAD-7 Score  7  8    Mini-Mental     ECT Treatment from 06/08/2017 in [facility] ECT Treatment from 05/30/2017 in [facility] ECT Treatment from 05/23/2017 in [facility]  Total Score (max 30 points )  '30  30  30    ' PHQ2-9     Office Visit from 08/07/2017 in [facility] Counselor from 03/08/2017 in [facility] Counselor from 02/26/2017 in [facility]  PHQ-2 Total Score  '2  5  6  ' PHQ-9 Total Score  '9  16  21       ' Assessment and Plan: Jamie Bennett presents with fairly stable and improving mood lability in the context of self education, learning about the features of borderline personality.  She has made efforts to schedule therapy for DBT, but has met multiple challenges with regard to insurance coverage and provide her willingness to engage in DBT.  She has received a list of providers that are in her network, and do provide DBT.  She received this list yesterday from her insurance company and will be calling providers to schedule an intake assessment.  She has not engaged in any cutting or self-injurious behaviors, and does feel like having knowledge and information on her diagnosis is making it easier to manage her anxiety.  I provided her some book recommendations so that she can  begin learning some of the DBT skills and build on this further in therapy.  1. Borderline personality disorder ([place])   2. Anxiety  state   3. Major depressive disorder, recurrent, severe without psychotic features ([place])     Status of current problems: gradually improving  Labs Ordered: No orders of the defined types were placed in this encounter.   Labs Reviewed: n/a  Collateral Obtained/Records Reviewed: n/a  Plan:  Continue Effexor, trazodone, Lamictal, Vistaril, BuSpar as prescribed above Effexor increased to 100 mg 3 times daily Clonazepam to use only as a backup medication for acute anxiety Spent time with the patient applauding her commitment to DBT and how impressed I am that she has been continuing to pursue this treatment despite some of the barriers she has met   I spent 20 minutes with the patient in direct face-to-face clinical care.  Greater than 50% of this time was spent in counseling and coordination of care with the patient.    Aundra Dubin, MD 10/03/2017, 2:49 PM

## 2017-10-03 NOTE — Progress Notes (Signed)
Office Visit Note  Patient: Jamie Bennett             Date of Birth: 11-Dec-1960           MRN: [id number]             PCP: Lavera Guise, MD Referring: Lavera Guise, MD Visit Date: 10/16/2017 Occupation: @GUAROCC @    Subjective:  Right knee pain    History of Present Illness: Jamie Bennett is a 57 y.o. female with history of seropositive rheumatoid arthritis, osteoarthritis, DDD, and fibromyalgia.  Patient states she continues to take Plaquenil 200 mg twice daily Monday through Friday.  She states that she has not had any recent flares of her rheumatoid arthritis.  She states she has some stiffness in her bilateral hands first thing in the morning.  She denies any hand swelling.  She denies any pain in her feet at this time.  She states her fibromyalgia has been stable.  She continues to have worsening fatigue and some insomnia.  She takes trazodone to help her sleep at night.  She denies any muscle tenderness or muscle aches at this time. She states that she has a grinding sensation in her neck and TMJ bilaterally.  She states she has mild discomfort in her neck.   Patient states that at the beginning of February she was walking in [place] her right knee started to hurt.  She denies any injury or falls.  She states the pain has been worsening.  She denies any giving out sensation.  She states she will occasionally feel a clicking sensation in her right knee.  She states the pain is worse if she is sitting for a prolonged period of time or walking for a prolonged period of time.    Activities of Daily Living:  Patient reports morning stiffness for 1-2 Hours.   Patient Reports nocturnal pain.  Difficulty dressing/grooming: Denies Difficulty climbing stairs: Reports Difficulty getting out of chair: Reports Difficulty using hands for taps, buttons, cutlery, and/or writing: Denies   Review of Systems  Constitutional: Positive for fatigue. Negative for weakness.  HENT:  Negative for mouth sores, trouble swallowing, trouble swallowing, mouth dryness and nose dryness.   Eyes: Negative for pain, redness, visual disturbance and dryness.  Respiratory: Negative for cough, hemoptysis, shortness of breath and difficulty breathing.   Cardiovascular: Negative for chest pain, palpitations, hypertension and swelling in legs/feet.  Gastrointestinal: Negative for blood in stool, constipation and diarrhea.  Endocrine: Negative for increased urination.  Genitourinary: Negative for painful urination.  Musculoskeletal: Positive for arthralgias, joint pain, joint swelling and morning stiffness. Negative for myalgias, muscle weakness, muscle tenderness and myalgias.  Skin: Negative for color change, rash, hair loss, nodules/bumps, redness, ulcers and sensitivity to sunlight.  Allergic/Immunologic: Negative for susceptible to infections.  Neurological: Negative for dizziness, numbness and headaches.  Hematological: Negative for swollen glands.  Psychiatric/Behavioral: Positive for depressed mood and sleep disturbance. The patient is nervous/anxious.     PMFS History:  Patient Active Problem List   Diagnosis Date Noted  . Primary osteoarthritis of both knees 01/19/2017  . Suicide attempt ([place]) 07/10/2016  . Fibromyalgia 07/10/2016  . High risk medication use 07/10/2016  . AKI (acute kidney injury) ([place]) 11/09/2015  . Elevated troponin 11/09/2015  . Hypotension 11/09/2015  . Respiratory failure ([place])   . Acute hepatic failure 11/08/2015  . Drug overdose 11/08/2015  . Fatty infiltration of liver 02/16/2015  . Hepatic fibrosis 02/16/2015  . Abnormal serum  level of alkaline phosphatase 02/15/2015  . Iron deficiency anemia 02/05/2015  . Vitamin B 12 deficiency 02/05/2015  . OP (osteoporosis) 06/16/2014  . Rheumatoid arteritis 03/02/2014  . Osteoarthritis of left hip 03/02/2014  . Hypothyroidism 03/02/2014  . Rheumatic fever without heart involvement 03/02/2014  . Adult  hypothyroidism 03/02/2014  . Arthritis of pelvic region, degenerative 03/02/2014  . Bipolar 1 disorder, depressed ([place]) 02/26/2014  . Bariatric surgery status 11/24/2013  . Affective bipolar disorder ([place]) 11/24/2013  . Bipolar affective disorder ([place]) 11/24/2013  . Rheumatoid arthritis ([place]) 11/24/2013  . Polysubstance (excluding opioids) dependence ([place]) 09/11/2013  . Polysubstance dependence ([place]) 09/11/2013  . Combined drug dependence excluding opioids ([place]) 09/11/2013  . Arthritis or polyarthritis, rheumatoid ([place]) 09/05/2013  . Leg weakness 09/05/2013    Past Medical History:  Diagnosis Date  . Anemia   . Anxiety   . Bipolar 1 disorder ([place])   . Collagen vascular disease ([place])    rhematoid arthritis  . Constipation   . Fibromyalgia   . Hepatic steatosis   . History of Roux-en-Y gastric bypass   . Hypotension   . Hypothyroidism   . Opioid abuse ([place])   . Osteoarthritis   . Osteoporosis   . PONV (postoperative nausea and vomiting)    nausea only  . Rheumatic fever   . Rheumatoid arthritis ([place])   . Thyroid disease     Family History  Problem Relation Age of Onset  . Depression Mother   . Dementia Mother   . Heart disease Mother   . Parkinson's disease Father    Past Surgical History:  Procedure Laterality Date  . CHOLECYSTECTOMY    . COLONOSCOPY N/A 07/16/2017   Procedure: COLONOSCOPY;  Surgeon: Manya Silvas, MD;  Location[facility] ENDOSCOPY;  Service: Endoscopy;  Laterality: N/A;  . GASTRIC BYPASS    . TONSILLECTOMY     Social History   Social History Narrative  . Not on file     Objective: Vital Signs: BP 116/70 (BP Location: Left Arm, Patient Position: Sitting, Cuff Size: Normal)   Pulse 81   Resp 19   Ht 5\' 5"  (1.651 m)   Wt (!) 328 lb (148.8 kg)   BMI 54.58 kg/m    Physical Exam  Constitutional: She is oriented to person, place, and time. She appears well-developed and well-nourished.  HENT:  Head: Normocephalic and atraumatic.  Eyes:  Conjunctivae and EOM are normal.  Neck: Normal range of motion.  Cardiovascular: Normal rate, regular rhythm, normal heart sounds and intact distal pulses.  Pulmonary/Chest: Effort normal and breath sounds normal.  Abdominal: Soft. Bowel sounds are normal.  Lymphadenopathy:    She has no cervical adenopathy.  Neurological: She is alert and oriented to person, place, and time.  Skin: Skin is warm and dry. Capillary refill takes less than 2 seconds.  Psychiatric: She has a normal mood and affect. Her behavior is normal.  Nursing note and vitals reviewed.    Musculoskeletal Exam: C-spine, thoracic spine, lumbar spine good range of motion.  No midline spinal tenderness.  No SI joint tenderness.  Shoulder joints, elbow joints, wrist joints, MCPs, PIPs, DIPs good range of motion with no synovitis.   Right knee limited extension.  She has warmth of her right knee effusion.  She has bilateral knee crepitus.  No warmth or effusion of left knee.  She has full extension and flexion of her left knee.  DIPs, PIPs, DIPs good range of motion with no synovitis.  No trochanteric  bursa tenderness.  CDAI Exam: CDAI Homunculus Exam:   Tenderness:  RLE: tibiofemoral  Joint Counts:  CDAI Tender Joint count: 1 CDAI Swollen Joint count: 0  Global Assessments:  Patient Global Assessment: 5   CDAI Calculated Score: 6    Investigation: No additional findings.PLQ eye exam: 03/2017 CBC Latest Ref Rng & Units 10/15/2017 07/12/2017 05/11/2017  WBC 3.6 - 11.0 K/uL 4.8 5.7 5.5  Hemoglobin 12.0 - 16.0 g/dL 13.1 12.7 12.8  Hematocrit 35.0 - 47.0 % 39.0 38.5 37.0  Platelets 150 - 440 K/uL 246 256 237   CMP Latest Ref Rng & Units 10/15/2017 07/11/2017 05/11/2017  Glucose 65 - 99 mg/dL - 102(H) 87  BUN 6 - 24 mg/dL - 17 16  Creatinine 0.57 - 1.00 mg/dL - 0.94 0.93  Sodium 134 - 144 mmol/L - 141 140  Potassium 3.5 - 5.2 mmol/L - 4.7 4.0  Chloride 96 - 106 mmol/L - 105 111  CO2 20 - 29 mmol/L - 22 21(L)    Calcium 8.7 - 10.2 mg/dL - 9.0 9.1  Total Protein 6.5 - 8.1 g/dL 7.3 6.9 -  Total Bilirubin 0.3 - 1.2 mg/dL 0.3 <0.2 -  Alkaline Phos 38 - 126 U/L 143(H) 164(H) -  AST 15 - 41 U/L 32 22 -  ALT 14 - 54 U/L 23 18 -    Imaging: Xr Knee 3 View Right  Result Date: 10/16/2017 Moderate medial compartment joint space narrowing. Medial and intercondylar osteophytes noted.  No chondrocalcinosis.  Moderate patellofemoral narrowing. Impression: Moderate osteoarthritis and moderate chondromalacia patella.    Speciality Comments: No specialty comments available.    Procedures:  Large Joint Inj: R knee on 10/16/2017 2:10 PM Indications: pain Details: 27 G 1.5 in needle, medial approach  Arthrogram: No  Medications: 1.5 mL lidocaine 1 %; 40 mg triamcinolone acetonide 40 MG/ML Aspirate: 0 mL Outcome: tolerated well, no immediate complications Procedure, treatment alternatives, risks and benefits explained, specific risks discussed. Consent was given by the patient. Immediately prior to procedure a time out was called to verify the correct patient, procedure, equipment, support staff and site/side marked as required. Patient was prepped and draped in the usual sterile fashion.     Allergies: Lactose intolerance (gi)   Assessment / Plan:     Visit Diagnoses: Rheumatoid arthritis involving multiple sites with positive rheumatoid factor ([place]): She has no synovitis on exam.  She has complete fist formation. She has not had any recent flares. She has warmth of her right knee.  She will continue on PLQ 200 mg BID M-F.  She does not need a refill at this time.  She will have her next eye exam performed in September 2019.   High risk medication use - PLQ eye exam: 03/2017. She has a CBC on 10/15/17 that was WNL.  AST and ALT wnl on 10/15/17.  Primary osteoarthritis of both knees: Left knee is doing well.  No warmth or effusion.  Right knee has been causing significant discomfort with prolonged standing  and stiffness after prolonged sitting.    Chronic pain of right knee - She has had significant pain in her right knee since February 2019.  No injury or fall.  She has been experiencing a clicking sensation. She has warmth and crepitus of right knee.  Right knee x-ray revealed moderate osteoarthritis and moderate chondromalacia patella.  She requested a right knee cortisone injection today.  She was advised to monitor her blood pressure closely following cortisone injection.  She tolerated  the procedure well.  We discussed that if this injection does not improve her pain then we will move forward with a MRI of her right knee.  Plan: XR KNEE 3 VIEW RIGHT   Fibromyalgia: She has not had any recent flares of fibromyalgia.  She has no muscle tension or muscle tenderness at this time.  She continues to have worsening fatigue.  She is seeing a hematologist to address decreasing ferritin.  According to patient she may be getting an iron infusion soon.  She continues to have interrupted sleep.  She takes Trazodone, which helps her sleep at night.   DDD (degenerative disc disease), cervical: She reports a grinding sensation with neck ROM.  She has occasional discomfort in her neck but it is tolerable.  DDD (degenerative disc disease), lumbar: No midline spinal tenderness.    DDD (degenerative disc disease), thoracic: No midline spinal tenderness.   Other osteoporosis without current pathological fracture: She takes vitamin D 4,000 units daily.  She gets Reclast infusions.    Other medical conditions are listed as follows:   History of bipolar disorder  History of depression  History of suicide attempt  History of gastroesophageal reflux (GERD)  History of anemia  History of hypothyroidism     Orders: Orders Placed This Encounter  Procedures  . Large Joint Inj: R knee  . XR KNEE 3 VIEW RIGHT   No orders of the defined types were placed in this encounter.   Face-to-face time spent with  patient was 30 minutes. >50% of time was spent in counseling and coordination of care.  Follow-Up Instructions: Return in about 5 months (around 03/18/2018) for Rheumatoid arthritis, Osteoarthritis, Fibromyalgia.   Hazel Sams PA-C  I examined and evaluated the patient with Hazel Sams PA.  Patient had a lot of pain and discomfort in her right knee joint.  Her right knee joint was injected with cortisone in the procedure as described above.  If her symptoms persist after a couple of weeks she is supposed to notify us.   The plan of care was discussed as noted above.  Bo Merino, MD   Note - This record has been created using Editor, commissioning.  Chart creation errors have been sought, but may not always  have been located. Such creation errors do not reflect on  the standard of medical care.

## 2017-10-05 ENCOUNTER — Other Ambulatory Visit (HOSPITAL_COMMUNITY): Payer: Self-pay | Admitting: Psychiatry

## 2017-10-09 ENCOUNTER — Other Ambulatory Visit (HOSPITAL_COMMUNITY): Payer: Self-pay

## 2017-10-09 DIAGNOSIS — F603 Borderline personality disorder: Secondary | ICD-10-CM

## 2017-10-09 DIAGNOSIS — F411 Generalized anxiety disorder: Secondary | ICD-10-CM

## 2017-10-09 DIAGNOSIS — F332 Major depressive disorder, recurrent severe without psychotic features: Secondary | ICD-10-CM

## 2017-10-09 MED ORDER — LAMOTRIGINE 150 MG PO TABS: 150.0000 mg | ORAL_TABLET | Freq: Two times a day (BID) | ORAL | 1 refills | Status: DC

## 2017-10-09 MED ORDER — BUSPIRONE HCL 30 MG PO TABS: 30.0000 mg | ORAL_TABLET | Freq: Two times a day (BID) | ORAL | 0 refills | Status: DC

## 2017-10-09 MED ORDER — TRAZODONE HCL 100 MG PO TABS: 200.0000 mg | ORAL_TABLET | Freq: Every day | ORAL | 1 refills | Status: DC

## 2017-10-09 MED ORDER — HYDROXYZINE HCL 50 MG PO TABS
50.0000 mg | ORAL_TABLET | Freq: Three times a day (TID) | ORAL | 1 refills | Status: DC | PRN
Start: 2017-10-09 — End: 2018-04-17

## 2017-10-10 ENCOUNTER — Telehealth (HOSPITAL_COMMUNITY): Payer: Self-pay

## 2017-10-10 NOTE — Telephone Encounter (Signed)
Patient needs a letter excusing her from jury duty, due by April 1. She also wanted to know if you had folowed up with the counciling? She would like to know where you want her to go. Please review and advise, thank you

## 2017-10-10 NOTE — Telephone Encounter (Signed)
I called [facility] and left them a voicemail.  I called just now again and finally got someone on the phone to take my message. They said they will call in 24-48 hours.  As far as the letter, I can provide that - does she have a fax number for the court house that she wants [place] to send it to?    In the meantime, I think it makes sense to get her plugged in with Evelena Peat to begin learning some DBT skills.  Perhaps we can keep her on Jessie's cancel list?

## 2017-10-11 ENCOUNTER — Ambulatory Visit: Payer: Self-pay

## 2017-10-11 ENCOUNTER — Ambulatory Visit: Payer: Self-pay | Admitting: Hematology and Oncology

## 2017-10-11 ENCOUNTER — Other Ambulatory Visit: Payer: Self-pay

## 2017-10-11 DIAGNOSIS — K760 Fatty (change of) liver, not elsewhere classified: Secondary | ICD-10-CM | POA: Diagnosis not present

## 2017-10-12 ENCOUNTER — Other Ambulatory Visit: Payer: Self-pay | Admitting: *Deleted

## 2017-10-12 DIAGNOSIS — E538 Deficiency of other specified B group vitamins: Secondary | ICD-10-CM

## 2017-10-12 DIAGNOSIS — D509 Iron deficiency anemia, unspecified: Secondary | ICD-10-CM

## 2017-10-15 ENCOUNTER — Inpatient Hospital Stay (HOSPITAL_BASED_OUTPATIENT_CLINIC_OR_DEPARTMENT_OTHER): Payer: Medicare HMO | Admitting: Hematology and Oncology

## 2017-10-15 ENCOUNTER — Encounter: Payer: Self-pay | Admitting: Hematology and Oncology

## 2017-10-15 ENCOUNTER — Inpatient Hospital Stay: Payer: Medicare HMO | Attending: Hematology and Oncology

## 2017-10-15 ENCOUNTER — Inpatient Hospital Stay: Payer: Medicare HMO

## 2017-10-15 ENCOUNTER — Other Ambulatory Visit: Payer: Self-pay

## 2017-10-15 ENCOUNTER — Ambulatory Visit: Payer: Self-pay

## 2017-10-15 VITALS — BP 130/85 | HR 72 | Temp 97.8°F | Resp 18 | Ht 65.0 in | Wt 329.2 lb

## 2017-10-15 DIAGNOSIS — M25562 Pain in left knee: Secondary | ICD-10-CM | POA: Diagnosis not present

## 2017-10-15 DIAGNOSIS — M069 Rheumatoid arthritis, unspecified: Secondary | ICD-10-CM | POA: Diagnosis not present

## 2017-10-15 DIAGNOSIS — F319 Bipolar disorder, unspecified: Secondary | ICD-10-CM

## 2017-10-15 DIAGNOSIS — M797 Fibromyalgia: Secondary | ICD-10-CM

## 2017-10-15 DIAGNOSIS — D72819 Decreased white blood cell count, unspecified: Secondary | ICD-10-CM | POA: Diagnosis not present

## 2017-10-15 DIAGNOSIS — D509 Iron deficiency anemia, unspecified: Secondary | ICD-10-CM

## 2017-10-15 DIAGNOSIS — K760 Fatty (change of) liver, not elsewhere classified: Secondary | ICD-10-CM | POA: Insufficient documentation

## 2017-10-15 DIAGNOSIS — M25561 Pain in right knee: Secondary | ICD-10-CM | POA: Diagnosis not present

## 2017-10-15 DIAGNOSIS — E538 Deficiency of other specified B group vitamins: Secondary | ICD-10-CM | POA: Diagnosis not present

## 2017-10-15 DIAGNOSIS — F1110 Opioid abuse, uncomplicated: Secondary | ICD-10-CM

## 2017-10-15 DIAGNOSIS — M1990 Unspecified osteoarthritis, unspecified site: Secondary | ICD-10-CM | POA: Insufficient documentation

## 2017-10-15 DIAGNOSIS — M810 Age-related osteoporosis without current pathological fracture: Secondary | ICD-10-CM

## 2017-10-15 DIAGNOSIS — E039 Hypothyroidism, unspecified: Secondary | ICD-10-CM

## 2017-10-15 DIAGNOSIS — R69 Illness, unspecified: Secondary | ICD-10-CM | POA: Diagnosis not present

## 2017-10-15 DIAGNOSIS — Z79899 Other long term (current) drug therapy: Secondary | ICD-10-CM | POA: Insufficient documentation

## 2017-10-15 DIAGNOSIS — K5900 Constipation, unspecified: Secondary | ICD-10-CM | POA: Diagnosis not present

## 2017-10-15 DIAGNOSIS — I998 Other disorder of circulatory system: Secondary | ICD-10-CM | POA: Insufficient documentation

## 2017-10-15 DIAGNOSIS — Z7982 Long term (current) use of aspirin: Secondary | ICD-10-CM | POA: Insufficient documentation

## 2017-10-15 DIAGNOSIS — Z9884 Bariatric surgery status: Secondary | ICD-10-CM

## 2017-10-15 LAB — CBC WITH DIFFERENTIAL/PLATELET
Basophils Absolute: 0 10*3/uL (ref 0–0.1)
Basophils Relative: 1 %
Eosinophils Absolute: 0.2 10*3/uL (ref 0–0.7)
Eosinophils Relative: 3 %
HCT: 39 % (ref 35.0–47.0)
Hemoglobin: 13.1 g/dL (ref 12.0–16.0)
Lymphocytes Relative: 35 %
Lymphs Abs: 1.7 10*3/uL (ref 1.0–3.6)
MCH: 30.2 pg (ref 26.0–34.0)
MCHC: 33.5 g/dL (ref 32.0–36.0)
MCV: 90.1 fL (ref 80.0–100.0)
Monocytes Absolute: 0.5 10*3/uL (ref 0.2–0.9)
Monocytes Relative: 10 %
Neutro Abs: 2.5 10*3/uL (ref 1.4–6.5)
Neutrophils Relative %: 51 %
Platelets: 246 10*3/uL (ref 150–440)
RBC: 4.33 MIL/uL (ref 3.80–5.20)
RDW: 13.1 % (ref 11.5–14.5)
WBC: 4.8 10*3/uL (ref 3.6–11.0)

## 2017-10-15 LAB — HEPATIC FUNCTION PANEL
ALT: 23 U/L (ref 14–54)
AST: 32 U/L (ref 15–41)
Albumin: 3.8 g/dL (ref 3.5–5.0)
Alkaline Phosphatase: 143 U/L — ABNORMAL HIGH (ref 38–126)
Bilirubin, Direct: <0.1 mg/dL — ABNORMAL LOW (ref 0.1–0.5)
Total Bilirubin: 0.3 mg/dL (ref 0.3–1.2)
Total Protein: 7.3 g/dL (ref 6.5–8.1)

## 2017-10-15 LAB — IRON AND TIBC
Iron: 84 ug/dL (ref 28–170)
Saturation Ratios: 19 % (ref 10.4–31.8)
TIBC: 445 ug/dL (ref 250–450)
UIBC: 361 ug/dL

## 2017-10-15 LAB — FERRITIN: Ferritin: 21 ng/mL (ref 11–307)

## 2017-10-15 LAB — SEDIMENTATION RATE: Sed Rate: 27 mm/hr (ref 0–30)

## 2017-10-15 LAB — FOLATE: Folate: 35 ng/mL (ref >5.9)

## 2017-10-15 MED ORDER — CYANOCOBALAMIN 1000 MCG/ML IJ SOLN
1000.0000 ug | Freq: Once | INTRAMUSCULAR | Status: AC
  Administered 2017-10-15: 1000 ug via INTRAMUSCULAR
  Filled 2017-10-15: qty 1

## 2017-10-15 NOTE — Progress Notes (Signed)
[facility] day:  10/15/17  Chief Complaint: Jamie Bennett is a 57 y.o. female status post gastric bypass surgery with iron deficiency anemia, B12 deficiency, and leukopenia who is seen for 6 month assessment.  HPI: The patient was last seen in the medical oncology clinic on 04/12/2017.  At that time, she was fatigued.  Her weight was stable.  Exam was stable.  Counts were normal.  Ferritin was 28.  Iron saturation was 19% with a TIBC of 412.  Folate was 34.    CBC on 07/12/2018 revealed a hematocrit of 38.5, hemoglobin 12.7, and MCV 91.1.  Ferritin was 24.  She has received monthly B12 (last 09/13/2017).  She underwent colonoscopy on 07/16/2017 by Dr. Gaylyn Cheers.  The scope was unable to be passed past the hepatic flexure.  The exam was otherwise normal.  She underwent colonoscopy in [name] in 07/2017 (no report available).  During the interim, patient having pain in her knees. She is scheduled to see rheumatology in the near future. Patient notes that she has continued fatigue. Patient denies ice pica and restless legs. Patient denies bleeding; no hematochezia, melena, or vaginal bleeding. She continues on Plaquenil for her rheumatoid arthritis.   Patient eating well. Patient's weight is down 3 pounds.  She rates pain 6/10 in clinic today.    Past Medical History:  Diagnosis Date  . Anemia   . Anxiety   . Bipolar 1 disorder ([place])   . Collagen vascular disease ([place])    rhematoid arthritis  . Constipation   . Fibromyalgia   . Hepatic steatosis   . History of Roux-en-Y gastric bypass   . Hypotension   . Hypothyroidism   . Opioid abuse ([place])   . Osteoarthritis   . Osteoporosis   . PONV (postoperative nausea and vomiting)    nausea only  . Rheumatic fever   . Rheumatoid arthritis ([place])   . Thyroid disease     Past Surgical History:  Procedure Laterality Date  . CHOLECYSTECTOMY    . COLONOSCOPY N/A 07/16/2017    Procedure: COLONOSCOPY;  Surgeon: Manya Silvas, MD;  Location: [facility] ENDOSCOPY;  Service: Endoscopy;  Laterality: N/A;  . GASTRIC BYPASS    . TONSILLECTOMY      Family History  Problem Relation Age of Onset  . Depression Mother   . Dementia Mother   . Heart disease Mother   . Parkinson's disease Father     Social History:  reports that  has never smoked. she has never used smokeless tobacco. She reports that she does not drink alcohol or use drugs.  She lives in [place].  She is accompanied by her husband today.  Allergies:  Allergies  Allergen Reactions  . Lactose Intolerance (Gi) Other (See Comments)    Bloating and GI distress    Current Medications: Current Outpatient Medications  Medication Sig Dispense Refill  . aspirin EC 81 MG tablet Take 81 mg by mouth daily.    . busPIRone (BUSPAR) 30 MG tablet Take 1 tablet (30 mg total) by mouth 2 (two) times daily. 180 tablet 0  . Cholecalciferol (HM VITAMIN D3) 4000 units CAPS Take 4,000 Units by mouth daily.     . clonazePAM (KLONOPIN) 0.5 MG tablet Take 1 tablet (0.5 mg total) by mouth 2 (two) times daily. 60 tablet 2  . Coenzyme Q-10 200 MG CAPS Take 200 mg by mouth daily.    . cyanocobalamin (,VITAMIN B-12,) 1000 MCG/ML injection  Inject 1,000 mcg into the muscle every 30 (thirty) days.    Marland Kitchen docusate calcium (SURFAK) 240 MG capsule Take by mouth.    . esomeprazole (NEXIUM) 40 MG capsule Take 1 capsule (40 mg total) by mouth daily at 12 noon.    . Fiber, Guar Gum, CHEW Chew 10 mg by mouth daily.    . hydroxychloroquine (PLAQUENIL) 200 MG tablet Take 1 tablet by mouth twice a day Monday through Friday 120 tablet 0  . Krill Oil 500 MG CAPS Take 500 mg by mouth daily.    Marland Kitchen lamoTRIgine (LAMICTAL) 150 MG tablet Take 1 tablet (150 mg total) by mouth 2 (two) times daily. 180 tablet 1  . levothyroxine (SYNTHROID, LEVOTHROID) 112 MCG tablet Take 112 mcg by mouth daily.    Marland Kitchen loratadine (CLARITIN) 10 MG tablet 10 mg  prn as needed     . Magnesium 400 MG CAPS Take 400 mg by mouth 2 (two) times daily.     . Multiple Vitamin (MULTIVITAMIN WITH MINERALS) TABS tablet Take 1 tablet by mouth daily.    Marland Kitchen tiZANidine (ZANAFLEX) 4 MG tablet Take 1 tablet (4 mg total) by mouth 3 (three) times daily as needed. 90 tablet 1  . traZODone (DESYREL) 100 MG tablet Take 2 tablets (200 mg total) by mouth at bedtime. 180 tablet 1  . venlafaxine (EFFEXOR) 100 MG tablet Take 1 tablet (100 mg total) by mouth 3 (three) times daily with meals. 270 tablet 1  . zoledronic acid (RECLAST) 5 MG/100ML SOLN injection Inject 5 mg into the vein See admin instructions. Patient takes yearly    . hydrOXYzine (ATARAX/VISTARIL) 50 MG tablet Take 1 tablet (50 mg total) by mouth 3 (three) times daily as needed for anxiety. (Patient not taking: Reported on 10/15/2017) 270 tablet 1  . ibuprofen (ADVIL,MOTRIN) 200 MG tablet Take 400 mg by mouth every 4 (four) hours as needed.     No current facility-administered medications for this visit.    Facility-Administered Medications Ordered in Other Visits  Medication Dose Route Frequency Provider Last Rate Last Dose  . cyanocobalamin ((VITAMIN B-12)) injection 1,000 mcg  1,000 mcg Intramuscular Once Lequita Asal, MD        Review of Systems:  GENERAL:  Fatigue.  Feels "pretty good".  No fevers or sweats.  Weight down 3 pounds.  PERFORMANCE STATUS (ECOG):  1 HEENT:  No visual changes, runny nose, sore throat, mouth sores or tenderness. Lungs: Shortness of breath with exertion.  No cough.  No hemoptysis. Cardiac:  No chest pain, palpitations, orthopnea, or PND. GI:  No nausea, vomiting, diarrhea, constipation, melena or hematochezia. GU:  No urgency, frequency, dysuria, or hematuria. Musculoskeletal:  Knee pain.  Bones and joints hurt all over.  Extremities:  No pain or swelling. Skin:  No rashes or skin changes. Neuro:  No headache, numbness or weakness, balance or coordination issues. Endocrine:  Hot flashes  twice a week. No diabetes.  Thyroid issues on Synthroid.   Psych:  Emotional.  Anxiety. Pain: 6/10-  Joint pain. Pain in knees Review of systems:  All other systems reviewed and found to be negative.  Physical Exam: Blood pressure 130/85, pulse 72, temperature 97.8 F (36.6 C), temperature source Tympanic, resp. rate 18, height '5\' 5"'  (1.651 m), weight (!) 329 lb 3.2 oz (149.3 kg), SpO2 97 %. GENERAL:  Well developed, well nourished, heavyset woman sitting comfortably in the exam room in no acute distress.  She has a cane at her side. MENTAL  STATUS:  Alert and oriented to person, place and time. HEAD:  Shoulder length brown hair.  Normocephalic, atraumatic, face symmetric, no Cushingoid features. EYES:  Gold rimmed glasses.  Brown eyes.  Pupils equal round and reactive to light and accomodation.  No conjunctivitis or scleral icterus. ENT:  Oropharynx clear without lesion.  Tongue normal. Mucous membranes moist.  RESPIRATORY:  Clear to auscultation without rales, wheezes or rhonchi. CARDIOVASCULAR:  Regular rate and rhythm without murmur, rub or gallop. ABDOMEN:  Fully round.  Soft, non-tender, with active bowel sounds, and no hepatosplenomegaly.  No masses. SKIN:  No rashes, ulcers or lesions. EXTREMITIES: No edema, no skin discoloration or tenderness.  No palpable cords. LYMPH NODES: No palpable cervical, supraclavicular, axillary or inguinal adenopathy  NEUROLOGICAL: Limited ROM left arm. PSYCH:  Appropriate.   Appointment on 10/15/2017  Component Date Value Ref Range Status  . WBC 10/15/2017 4.8  3.6 - 11.0 K/uL Final  . RBC 10/15/2017 4.33  3.80 - 5.20 MIL/uL Final  . Hemoglobin 10/15/2017 13.1  12.0 - 16.0 g/dL Final  . HCT 10/15/2017 39.0  35.0 - 47.0 % Final  . MCV 10/15/2017 90.1  80.0 - 100.0 fL Final  . MCH 10/15/2017 30.2  26.0 - 34.0 pg Final  . MCHC 10/15/2017 33.5  32.0 - 36.0 g/dL Final  . RDW 10/15/2017 13.1  11.5 - 14.5 % Final  . Platelets 10/15/2017 246  150 - 440  K/uL Final  . Neutrophils Relative % 10/15/2017 51  % Final  . Neutro Abs 10/15/2017 2.5  1.4 - 6.5 K/uL Final  . Lymphocytes Relative 10/15/2017 35  % Final  . Lymphs Abs 10/15/2017 1.7  1.0 - 3.6 K/uL Final  . Monocytes Relative 10/15/2017 10  % Final  . Monocytes Absolute 10/15/2017 0.5  0.2 - 0.9 K/uL Final  . Eosinophils Relative 10/15/2017 3  % Final  . Eosinophils Absolute 10/15/2017 0.2  0 - 0.7 K/uL Final  . Basophils Relative 10/15/2017 1  % Final  . Basophils Absolute 10/15/2017 0.0  0 - 0.1 K/uL Final   Performed at [facility], [address] [place], [place] [address]  . Total Protein 10/15/2017 7.3  6.5 - 8.1 g/dL Final  . Albumin 10/15/2017 3.8  3.5 - 5.0 g/dL Final  . AST 10/15/2017 32  15 - 41 U/L Final  . ALT 10/15/2017 23  14 - 54 U/L Final  . Alkaline Phosphatase 10/15/2017 143* 38 - 126 U/L Final  . Total Bilirubin 10/15/2017 0.3  0.3 - 1.2 mg/dL Final  . Bilirubin, Direct 10/15/2017 <0.1* 0.1 - 0.5 mg/dL Final  . Indirect Bilirubin 10/15/2017 NOT CALCULATED  0.3 - 0.9 mg/dL Final   Performed at [facility], [address] [place], [place] [address]    Assessment:  Jamie Bennett is a 57 y.o. female female with a history of gastric bypass surgery in 2003 and subsequent B12 deficiency and iron deficiency anemia. Last colonoscopy was 7 years ago. Diet is good. She eats rich foods. She Is unable to absorb oral iron.   She received IV iron in 02/2014. She received Feraheme 510 mg on 09/15/2015 and 09/22/2015.    Ferritin has been followed: 38 on 02/05/2015, 160 on 05/14/2015, 14 on 09/13/2015, 373 on 10/13/2015, 217 on 12/21/2015, 102 on 04/24/2016, 73 on 07/10/2016, 39 on 10/09/2016, 37 on 01/01/2017, 28 on 04/12/2017, 24 on 07/12/2017, and 21 on 10/15/2017.  She receives B12 monthly (last 09/13/2017).   Folate was 34 on  04/12/2017.   Colonoscopy on 07/16/2017 revealed no abnormalities.  The scope was unable to be passed past the hepatic  flexure.  She underwent colonoscopy in [facility] in 07/2017 (no report available).  She also has a history of rheumatoid arthritis for which she received Humira in the past as well as methotrexate. Humira caused leukopenia. Methotrexate caused liver toxicity. She is currently on Plaquenil.   She was admitted from 11/08/2015 - 11/19/2015 with altered mental status secondary to an overdose of  Provigil.  Course was complicated by metabolic encephalopathy, intubation, acute renal failure requiring hemodialysis, MSSA pneumonia, Klebsiella UTI, and acute hepatic failure.  CBC at discharge included a hematocrit of 27.1, hemoglobin 9.2, MCV 95.1, platelets 103,000, and WBC 8900.    Symptomatically, she is fatigued.  Her weight down 3 pounds.  Exam is stable.  Counts are normal.  Plan: 1.  Labs today:  CBC with diff, ferritin, iron studies, folate, ESR 2.  B12 today and monthly x 5. 3.  Call with lab results today. CBC is normal. Patient is not anemic. If ferritin continues to be low, we will schedule patient for Feraheme x 1.  Adjust cutoff for IV iron to < 20.  4.  RTC in 3 months for labs (CBC with diff, ferritin) + B12. 5.  RTC in 6 months for MD assessment, labs (CBC with diff, ferritin, iron studies, folate), and B12.   Honor Loh, NP  10/15/2017, 12:21 PM   I saw and evaluated the patient, participating in the key portions of the service and reviewing pertinent diagnostic studies and records.  I reviewed the nurse practitioner's note and agree with the findings and the plan.  The assessment and plan were discussed with the patient.  A few questions were asked by the patient and answered.   Lequita Asal, MD 10/15/2017, 12:21 PM

## 2017-10-15 NOTE — Progress Notes (Signed)
Increase pain noted to right knee pain level (6).

## 2017-10-16 ENCOUNTER — Ambulatory Visit: Payer: Medicare HMO | Admitting: Rheumatology

## 2017-10-16 ENCOUNTER — Ambulatory Visit (INDEPENDENT_AMBULATORY_CARE_PROVIDER_SITE_OTHER): Payer: Medicare HMO

## 2017-10-16 ENCOUNTER — Encounter: Payer: Self-pay | Admitting: Rheumatology

## 2017-10-16 VITALS — BP 116/70 | HR 81 | Resp 19 | Ht 65.0 in | Wt 328.0 lb

## 2017-10-16 DIAGNOSIS — Z915 Personal history of self-harm: Secondary | ICD-10-CM

## 2017-10-16 DIAGNOSIS — M797 Fibromyalgia: Secondary | ICD-10-CM | POA: Diagnosis not present

## 2017-10-16 DIAGNOSIS — M5134 Other intervertebral disc degeneration, thoracic region: Secondary | ICD-10-CM

## 2017-10-16 DIAGNOSIS — Z8719 Personal history of other diseases of the digestive system: Secondary | ICD-10-CM | POA: Diagnosis not present

## 2017-10-16 DIAGNOSIS — Z8659 Personal history of other mental and behavioral disorders: Secondary | ICD-10-CM | POA: Diagnosis not present

## 2017-10-16 DIAGNOSIS — G8929 Other chronic pain: Secondary | ICD-10-CM

## 2017-10-16 DIAGNOSIS — M818 Other osteoporosis without current pathological fracture: Secondary | ICD-10-CM

## 2017-10-16 DIAGNOSIS — M25561 Pain in right knee: Secondary | ICD-10-CM

## 2017-10-16 DIAGNOSIS — M5136 Other intervertebral disc degeneration, lumbar region: Secondary | ICD-10-CM | POA: Diagnosis not present

## 2017-10-16 DIAGNOSIS — M5030 Other cervical disc degeneration, unspecified cervical region: Secondary | ICD-10-CM | POA: Diagnosis not present

## 2017-10-16 DIAGNOSIS — Z862 Personal history of diseases of the blood and blood-forming organs and certain disorders involving the immune mechanism: Secondary | ICD-10-CM | POA: Diagnosis not present

## 2017-10-16 DIAGNOSIS — Z79899 Other long term (current) drug therapy: Secondary | ICD-10-CM | POA: Diagnosis not present

## 2017-10-16 DIAGNOSIS — Z8639 Personal history of other endocrine, nutritional and metabolic disease: Secondary | ICD-10-CM

## 2017-10-16 DIAGNOSIS — Z9151 Personal history of suicidal behavior: Secondary | ICD-10-CM

## 2017-10-16 DIAGNOSIS — R69 Illness, unspecified: Secondary | ICD-10-CM | POA: Diagnosis not present

## 2017-10-16 DIAGNOSIS — M170 Bilateral primary osteoarthritis of knee: Secondary | ICD-10-CM | POA: Diagnosis not present

## 2017-10-16 DIAGNOSIS — M0579 Rheumatoid arthritis with rheumatoid factor of multiple sites without organ or systems involvement: Secondary | ICD-10-CM

## 2017-10-16 MED ORDER — TRIAMCINOLONE ACETONIDE 40 MG/ML IJ SUSP
40.0000 mg | INTRAMUSCULAR | Status: AC | PRN
  Administered 2017-10-16: 40 mg via INTRA_ARTICULAR

## 2017-10-16 MED ORDER — LIDOCAINE HCL 1 % IJ SOLN
1.5000 mL | INTRAMUSCULAR | Status: AC | PRN
  Administered 2017-10-16: 1.5 mL

## 2017-10-31 ENCOUNTER — Other Ambulatory Visit: Payer: Self-pay

## 2017-10-31 MED ORDER — TIZANIDINE HCL 4 MG PO TABS: 4.0000 mg | ORAL_TABLET | Freq: Three times a day (TID) | ORAL | 1 refills | Status: DC | PRN

## 2017-11-12 ENCOUNTER — Ambulatory Visit: Payer: Self-pay

## 2017-11-13 ENCOUNTER — Ambulatory Visit (INDEPENDENT_AMBULATORY_CARE_PROVIDER_SITE_OTHER): Payer: Medicare HMO | Admitting: Nurse Practitioner

## 2017-11-13 ENCOUNTER — Encounter: Payer: Self-pay | Admitting: Nurse Practitioner

## 2017-11-13 VITALS — BP 122/82 | HR 75 | Resp 16 | Ht 65.0 in | Wt 320.0 lb

## 2017-11-13 DIAGNOSIS — I00 Rheumatic fever without heart involvement: Secondary | ICD-10-CM | POA: Diagnosis not present

## 2017-11-13 DIAGNOSIS — L209 Atopic dermatitis, unspecified: Secondary | ICD-10-CM | POA: Diagnosis not present

## 2017-11-13 DIAGNOSIS — M0520 Rheumatoid vasculitis with rheumatoid arthritis of unspecified site: Secondary | ICD-10-CM

## 2017-11-13 DIAGNOSIS — E039 Hypothyroidism, unspecified: Secondary | ICD-10-CM | POA: Diagnosis not present

## 2017-11-13 DIAGNOSIS — R69 Illness, unspecified: Secondary | ICD-10-CM | POA: Diagnosis not present

## 2017-11-13 DIAGNOSIS — F3160 Bipolar disorder, current episode mixed, unspecified: Secondary | ICD-10-CM

## 2017-11-13 MED ORDER — MUPIROCIN 2 % EX OINT: 1.0000 "application " | TOPICAL_OINTMENT | Freq: Two times a day (BID) | CUTANEOUS | 1 refills | Status: DC

## 2017-11-13 NOTE — Progress Notes (Signed)
[facility] [place], [place] [address]  Internal MEDICINE  Office Visit Note  Patient Name: Jamie Bennett  [id number]  [id number]  Date of Service: 12/05/2017   Pt is here for routine follow up.   Chief Complaint  Patient presents with  . Rash    belly button    The patient is here for routine follow up visit. Today, she is c/o blood from belly button. She does use a q-tip to clean the inside of the belly button. Has noted blood on the q-tip for past 2 months. No pain associated. No scabbing present. No other drainage present on q-tips. She has had labs and bone density done since her last visit .      Current Medication: Outpatient Encounter Medications as of 11/13/2017  Medication Sig Note  . venlafaxine (EFFEXOR) 100 MG tablet Take 1 tablet (100 mg total) by mouth 3 (three) times daily with meals.   Marland Kitchen aspirin EC 81 MG tablet Take 81 mg by mouth daily.   . Cholecalciferol (HM VITAMIN D3) 4000 units CAPS Take 4,000 Units by mouth daily.    . clonazePAM (KLONOPIN) 0.5 MG tablet Take 1 tablet (0.5 mg total) by mouth 2 (two) times daily. (Patient taking differently: Take 0.5 mg by mouth as needed. )   . Coenzyme Q-10 200 MG CAPS Take 200 mg by mouth daily.   . cyanocobalamin (,VITAMIN B-12,) 1000 MCG/ML injection Inject 1,000 mcg into the muscle every 30 (thirty) days.   Marland Kitchen docusate calcium (SURFAK) 240 MG capsule Take by mouth.   . esomeprazole (NEXIUM) 40 MG capsule Take 1 capsule (40 mg total) by mouth daily at 12 noon.   . Fiber, Guar Gum, CHEW Chew 10 mg by mouth daily.   . hydroxychloroquine (PLAQUENIL) 200 MG tablet Take 1 tablet by mouth twice a day Monday through Friday   . hydrOXYzine (ATARAX/VISTARIL) 50 MG tablet Take 1 tablet (50 mg total) by mouth 3 (three) times daily as needed for anxiety.   Marland Kitchen ibuprofen (ADVIL,MOTRIN) 200 MG tablet Take 400 mg by mouth every 4 (four) hours as needed.   Javier Docker Oil 500 MG CAPS Take 500 mg by mouth  daily.   Marland Kitchen lamoTRIgine (LAMICTAL) 150 MG tablet Take 1 tablet (150 mg total) by mouth 2 (two) times daily.   Marland Kitchen loratadine (CLARITIN) 10 MG tablet 10 mg  prn as needed 03/02/2017: Takes only as needed.   . Magnesium 400 MG CAPS Take 400 mg by mouth 2 (two) times daily.    . Multiple Vitamin (MULTIVITAMIN WITH MINERALS) TABS tablet Take 1 tablet by mouth daily.   . mupirocin ointment (BACTROBAN) 2 % Place 1 application into the nose 2 (two) times daily.   Marland Kitchen tiZANidine (ZANAFLEX) 4 MG tablet Take 1 tablet (4 mg total) by mouth 3 (three) times daily as needed.   . traZODone (DESYREL) 100 MG tablet Take 2 tablets (200 mg total) by mouth at bedtime.   . zoledronic acid (RECLAST) 5 MG/100ML SOLN injection Inject 5 mg into the vein See admin instructions. Patient takes yearly   . [DISCONTINUED] busPIRone (BUSPAR) 30 MG tablet Take 1 tablet (30 mg total) by mouth 2 (two) times daily.   . [DISCONTINUED] levothyroxine (SYNTHROID, LEVOTHROID) 112 MCG tablet Take 112 mcg by mouth daily.    No facility-administered encounter medications on file as of 11/13/2017.     Surgical History: Past Surgical History:  Procedure Laterality Date  . CHOLECYSTECTOMY    .  COLONOSCOPY N/A 07/16/2017   Procedure: COLONOSCOPY;  Surgeon: Manya Silvas, MD;  Location: [facility] ENDOSCOPY;  Service: Endoscopy;  Laterality: N/A;  . GASTRIC BYPASS    . TONSILLECTOMY      Medical History: Past Medical History:  Diagnosis Date  . Anemia   . Anxiety   . Bipolar 1 disorder ([place])   . Collagen vascular disease ([place])    rhematoid arthritis  . Constipation   . Fibromyalgia   . Hepatic steatosis   . History of Roux-en-Y gastric bypass   . Hypotension   . Hypothyroidism   . Opioid abuse ([place])   . Osteoarthritis   . Osteoporosis   . PONV (postoperative nausea and vomiting)    nausea only  . Rheumatic fever   . Rheumatoid arthritis ([place])   . Thyroid disease     Family History: Family History  Problem Relation Age of  Onset  . Depression Mother   . Dementia Mother   . Heart disease Mother   . Parkinson's disease Father     Social History   Socioeconomic History  . Marital status: Married    Spouse name: Not on file  . Number of children: Not on file  . Years of education: Not on file  . Highest education level: Not on file  Occupational History  . Not on file  Social Needs  . Financial resource strain: Not on file  . Food insecurity:    Worry: Not on file    Inability: Not on file  . Transportation needs:    Medical: Not on file    Non-medical: Not on file  Tobacco Use  . Smoking status: Never Smoker  . Smokeless tobacco: Never Used  Substance and Sexual Activity  . Alcohol use: No    Alcohol/week: 0.0 oz  . Drug use: No  . Sexual activity: Never  Lifestyle  . Physical activity:    Days per week: Not on file    Minutes per session: Not on file  . Stress: Not on file  Relationships  . Social connections:    Talks on phone: Not on file    Gets together: Not on file    Attends religious service: Not on file    Active member of club or organization: Not on file    Attends meetings of clubs or organizations: Not on file    Relationship status: Not on file  . Intimate partner violence:    Fear of current or ex partner: Not on file    Emotionally abused: Not on file    Physically abused: Not on file    Forced sexual activity: Not on file  Other Topics Concern  . Not on file  Social History Narrative  . Not on file      Review of Systems  Constitutional: Negative for activity change, chills, fatigue and unexpected weight change.  HENT: Negative for congestion, postnasal drip, rhinorrhea, sneezing and sore throat.   Eyes: Negative.  Negative for redness.  Respiratory: Negative for cough, chest tightness, shortness of breath and wheezing.   Cardiovascular: Negative for chest pain and palpitations.  Gastrointestinal: Negative for abdominal pain, constipation, diarrhea, nausea  and vomiting.  Genitourinary: Negative.  Negative for dysuria and frequency.  Musculoskeletal: Positive for arthralgias, back pain and myalgias. Negative for joint swelling and neck pain.  Skin: Positive for rash.       Has noted some blood tinged discharge from her belly button. This is usually happening when she cleans  it out with q-tip. States that it does not hurt. No odor present.   Allergic/Immunologic: Negative for environmental allergies.  Neurological: Positive for dizziness and headaches. Negative for tremors and numbness.  Hematological: Negative for adenopathy. Does not bruise/bleed easily.  Psychiatric/Behavioral: Positive for dysphoric mood. Negative for behavioral problems (Depression), sleep disturbance and suicidal ideas. The patient is nervous/anxious.        Patient is regularly sees psychiatry.     Today's Vitals   11/13/17 1406  BP: 122/82  Pulse: 75  Resp: 16  SpO2: 95%  Weight: (!) 320 lb (145.2 kg)  Height: 5\' 5"  (1.651 m)     Physical Exam  Constitutional: She is oriented to person, place, and time. She appears well-developed and well-nourished. No distress.  HENT:  Head: Normocephalic and atraumatic.  Nose: Nose normal.  Mouth/Throat: Oropharynx is clear and moist. No oropharyngeal exudate.  Eyes: Pupils are equal, round, and reactive to light. EOM are normal.  Neck: Normal range of motion. Neck supple. No JVD present. No tracheal deviation present. No thyromegaly present.  Cardiovascular: Normal rate, regular rhythm and normal heart sounds. Exam reveals no gallop and no friction rub.  No murmur heard. Pulmonary/Chest: Effort normal and breath sounds normal. No respiratory distress. She has no wheezes. She has no rales. She exhibits no tenderness.  Abdominal: Soft. Bowel sounds are normal. There is no tenderness.  Musculoskeletal: Normal range of motion.  Lymphadenopathy:    She has no cervical adenopathy.  Neurological: She is alert and oriented to  person, place, and time. She displays normal reflexes. No cranial nerve deficit.  Skin: Skin is warm and dry. Capillary refill takes less than 2 seconds. She is not diaphoretic.  There is some redness and scabbing noted in the naval. Very mild erythema present. No current bleeding or drainage is noted.   Psychiatric: Her speech is normal and behavior is normal. Judgment and thought content normal. Cognition and memory are normal. She exhibits a depressed mood.  Nursing note and vitals reviewed.  Assessment/Plan: 1. Atopic dermatitis, unspecified type Will try bactroban ointment twice daily for next 7 to 10 days. Refer to dermatology as needed - mupirocin ointment (BACTROBAN) 2 %; Place 1 application into the nose 2 (two) times daily.  Dispense: 22 g; Refill: 1  2. Rheumatoid arteritis Continue regular visits with rheumatology as scheduled.   3. Acquired hypothyroidism Thyroid panel stable. Continue levothyroxine as prescribed.   4. Bipolar affective disorder, current episode mixed, current episode severity unspecified ([place]) Continue regular visits with psychiatry as scheduled.   General Counseling: tiannah greenly understanding of the findings of todays visit and agrees with plan of treatment. I have discussed any further diagnostic evaluation that may be needed or ordered today. We also reviewed her medications today. she has been encouraged to call the office with any questions or concerns that should arise related to todays visit.   This patient was seen by Leretha Pol, FNP- C in Collaboration with Dr Lavera Guise as a part of collaborative care agreement  Meds ordered this encounter  Medications  . mupirocin ointment (BACTROBAN) 2 %    Sig: Place 1 application into the nose 2 (two) times daily.    Dispense:  22 g    Refill:  1    Order Specific Question:   Supervising Provider    Answer:   Lavera Guise [[id number]]    Time spent: 9 Minutes       Dr Lavera Guise  Internal medicine

## 2017-11-15 ENCOUNTER — Inpatient Hospital Stay: Payer: Medicare HMO | Attending: Hematology and Oncology

## 2017-11-15 DIAGNOSIS — E538 Deficiency of other specified B group vitamins: Secondary | ICD-10-CM | POA: Insufficient documentation

## 2017-11-15 DIAGNOSIS — Z79899 Other long term (current) drug therapy: Secondary | ICD-10-CM | POA: Diagnosis not present

## 2017-11-15 MED ORDER — CYANOCOBALAMIN 1000 MCG/ML IJ SOLN
1000.0000 ug | Freq: Once | INTRAMUSCULAR | Status: AC
  Administered 2017-11-15: 1000 ug via INTRAMUSCULAR

## 2017-11-19 ENCOUNTER — Other Ambulatory Visit: Payer: Self-pay

## 2017-11-19 ENCOUNTER — Telehealth (HOSPITAL_COMMUNITY): Payer: Self-pay

## 2017-11-19 DIAGNOSIS — F411 Generalized anxiety disorder: Secondary | ICD-10-CM

## 2017-11-19 MED ORDER — LEVOTHYROXINE SODIUM 112 MCG PO TABS: 112.0000 ug | ORAL_TABLET | Freq: Every day | ORAL | 1 refills | Status: DC

## 2017-11-19 NOTE — Telephone Encounter (Signed)
Medication management - Telephone call with pt to follow up on call she left stating her [place] would no longer be able to get the 30 mg Buspar but only 15 mg tablets and requested a new order be sent in for the 15 mg, 4 a day.  Agreed to send request to Dr. Daron Offer and to call patient back once changed prescription complete.

## 2017-11-20 MED ORDER — BUSPIRONE HCL 15 MG PO TABS: 15.0000 mg | ORAL_TABLET | Freq: Four times a day (QID) | ORAL | 0 refills | Status: DC

## 2017-11-20 NOTE — Telephone Encounter (Signed)
Oh yes that would be fine thank you! 90 days is fine to send

## 2017-11-20 NOTE — Telephone Encounter (Signed)
Medication management - Telephone call with patient to inform a new Buspar 15 mg, four times a day, #360 for 90 day supply e-scribed to her [facility] as she had requested and patient to call back if any problems filling.

## 2017-11-20 NOTE — Telephone Encounter (Signed)
New prescription for 15 mg Buspar 1 po QID was sent to the pharmacy this morning

## 2017-12-05 DIAGNOSIS — L209 Atopic dermatitis, unspecified: Secondary | ICD-10-CM | POA: Insufficient documentation

## 2017-12-17 ENCOUNTER — Other Ambulatory Visit: Payer: Self-pay | Admitting: Rheumatology

## 2017-12-17 ENCOUNTER — Inpatient Hospital Stay: Payer: Medicare HMO | Attending: Hematology and Oncology

## 2017-12-17 DIAGNOSIS — Z79899 Other long term (current) drug therapy: Secondary | ICD-10-CM | POA: Diagnosis not present

## 2017-12-17 DIAGNOSIS — E538 Deficiency of other specified B group vitamins: Secondary | ICD-10-CM | POA: Diagnosis not present

## 2017-12-17 MED ORDER — CYANOCOBALAMIN 1000 MCG/ML IJ SOLN
1000.0000 ug | Freq: Once | INTRAMUSCULAR | Status: AC
  Administered 2017-12-17: 1000 ug via INTRAMUSCULAR

## 2017-12-17 NOTE — Telephone Encounter (Signed)
Last Visit: 10/16/17 Next Visit: 03/19/18 Labs: CBC on 10/15/17 that was WNL.  AST and ALT wnl on 10/15/17. PLQ eye exam: 03/2017.   Okay to refill per Dr. Estanislado Pandy

## 2017-12-19 ENCOUNTER — Encounter (HOSPITAL_COMMUNITY): Payer: Self-pay | Admitting: Psychiatry

## 2017-12-19 ENCOUNTER — Ambulatory Visit (HOSPITAL_COMMUNITY): Payer: Medicare HMO | Admitting: Psychiatry

## 2017-12-19 VITALS — BP 138/88 | HR 79 | Ht 65.0 in | Wt 325.6 lb

## 2017-12-19 DIAGNOSIS — Z818 Family history of other mental and behavioral disorders: Secondary | ICD-10-CM

## 2017-12-19 DIAGNOSIS — F332 Major depressive disorder, recurrent severe without psychotic features: Secondary | ICD-10-CM | POA: Diagnosis not present

## 2017-12-19 DIAGNOSIS — F4312 Post-traumatic stress disorder, chronic: Secondary | ICD-10-CM | POA: Diagnosis not present

## 2017-12-19 DIAGNOSIS — Z79899 Other long term (current) drug therapy: Secondary | ICD-10-CM | POA: Diagnosis not present

## 2017-12-19 DIAGNOSIS — F603 Borderline personality disorder: Secondary | ICD-10-CM

## 2017-12-19 DIAGNOSIS — R69 Illness, unspecified: Secondary | ICD-10-CM | POA: Diagnosis not present

## 2017-12-19 NOTE — Patient Instructions (Signed)
Call [facility] [phone] [place] [phone]

## 2017-12-19 NOTE — Progress Notes (Signed)
BH MD/PA/NP OP Progress Note  12/19/2017 3:48 PM Jamie Bennett  MRN:  [id number]  Chief Complaint: doing a little better, some setbacks HPI: Jamie Bennett presents with her husband.  Reports that overall she does feel that she is made some forward progress on learning to be able to express herself better.  She denies any suicidal thoughts.  She reports that she had a few day period where she was taking 3-4 times the amount of prescribed Effexor in a day, and because she thought this would give her energy and help her with her motivation levels.  She realizes this is not safe and she stopped after her husband found out.  Her husband continues to be the primary controller of her medications and since then, approximately 2 weeks ago, he has been even more close monitoring with her medications.  He gives her her medications on a day-to-day basis and watches her take them.  She again denies any thoughts to harm herself, but was hoping that it would energize her much like Provigil had in the past.  I spent time with the patient educating her once again on medication management safety, and discussing the utility of DBT, encouraging her to reach out to [place] providers locally.  I provided her information on [facility] [place], and she was receptive to calling them.  She was also receptive to calling [facility] to schedule an individual therapy visit.  She has bought a DBT book and has been working on this individually and with her husband, and doing a lot of reading online about DBT skills.  I spent time with the patient educating her that I am transitioning out of office at the end of August, and we discussed the transition of care to Dr. Casimiro Needle for medication management given her comorbid medical issues.  We agreed to continue the current doses of medications and we will follow-up in 8-10 weeks.   she agrees to start individual therapy and also reach out to [facility] [place] for DBT group therapies.  Visit Diagnosis:    ICD-10-CM   1. Chronic post-traumatic stress disorder (PTSD) F43.12   2. Borderline personality disorder ([place]) F60.3   3. Major depressive disorder, recurrent, severe without psychotic features ([place]) F33.2     Past Psychiatric History: See intake H&P for full details. Reviewed, with no updates at this time.   Past Medical History:  Past Medical History:  Diagnosis Date  . Anemia   . Anxiety   . Bipolar 1 disorder ([place])   . Collagen vascular disease ([place])    rhematoid arthritis  . Constipation   . Fibromyalgia   . Hepatic steatosis   . History of Roux-en-Y gastric bypass   . Hypotension   . Hypothyroidism   . Opioid abuse ([place])   . Osteoarthritis   . Osteoporosis   . PONV (postoperative nausea and vomiting)    nausea only  . Rheumatic fever   . Rheumatoid arthritis ([place])   . Thyroid disease     Past Surgical History:  Procedure Laterality Date  . CHOLECYSTECTOMY    . COLONOSCOPY N/A 07/16/2017   Procedure: COLONOSCOPY;  Surgeon: Manya Silvas, MD;  Location[facility] END[facility]COPY;  Service: Endoscopy;  Laterality: N/A;  . GASTRIC BYPASS    . TONSILLECTOMY      Family Psychiatric History: See intake H&P for full details. Reviewed, with no updates at this time.   Family History:  Family History  Problem  Relation Age of Onset  . Depression Mother   . Dementia Mother   . Heart disease Mother   . Parkinson's disease Father     Social History:  Social History   Socioeconomic History  . Marital status: Married    Spouse name: Not on file  . Number of children: Not on file  . Years of education: Not on file  . Highest education level: Not on file  Occupational History  . Not on file  Social Needs  . Financial resource strain: Not on file  . Food insecurity:    Worry: Not on file    Inability: Not on file  . Transportation needs:    Medical: Not on file    Non-medical: Not on file  Tobacco Use  .  Smoking status: Never Smoker  . Smokeless tobacco: Never Used  Substance and Sexual Activity  . Alcohol use: No    Alcohol/week: 0.0 oz  . Drug use: No  . Sexual activity: Never  Lifestyle  . Physical activity:    Days per week: Not on file    Minutes per session: Not on file  . Stress: Not on file  Relationships  . Social connections:    Talks on phone: Not on file    Gets together: Not on file    Attends religious service: Not on file    Active member of club or organization: Not on file    Attends meetings of clubs or organizations: Not on file    Relationship status: Not on file  Other Topics Concern  . Not on file  Social History Narrative  . Not on file    Allergies:  Allergies  Allergen Reactions  . Lactose Intolerance (Gi) Other (See Comments)    Bloating and GI distress    Metabolic Disorder Labs: No results found for: HGBA1C, MPG No results found for: PROLACTIN Lab Results  Component Value Date   TRIG 176 (H) 11/09/2015   Lab Results  Component Value Date   TSH 0.147 (L) 11/11/2015   TSH 0.354 (L) 01/28/2014    Therapeutic Level Labs: No results found for: LITHIUM No results found for: VALPROATE No components found for:  CBMZ  Current Medications: Current Outpatient Medications  Medication Sig Dispense Refill  . aspirin EC 81 MG tablet Take 81 mg by mouth daily.    . busPIRone (BUSPAR) 15 MG tablet Take 1 tablet (15 mg total) by mouth 4 (four) times daily. 360 tablet 0  . Cholecalciferol (HM VITAMIN D3) 4000 units CAPS Take 4,000 Units by mouth daily.     . clonazePAM (KLONOPIN) 0.5 MG tablet Take 1 tablet (0.5 mg total) by mouth 2 (two) times daily. (Patient taking differently: Take 0.5 mg by mouth as needed. ) 60 tablet 2  . Coenzyme Q-10 200 MG CAPS Take 200 mg by mouth daily.    . cyanocobalamin (,VITAMIN B-12,) 1000 MCG/ML injection Inject 1,000 mcg into the muscle every 30 (thirty) days.    Marland Kitchen docusate calcium (SURFAK) 240 MG capsule Take by  mouth.    . esomeprazole (NEXIUM) 40 MG capsule Take 1 capsule (40 mg total) by mouth daily at 12 noon.    . Fiber, Guar Gum, CHEW Chew 10 mg by mouth daily.    . hydroxychloroquine (PLAQUENIL) 200 MG tablet Take 1 tablet by mouth twice a day Monday through Friday 120 tablet 0  . hydrOXYzine (ATARAX/VISTARIL) 50 MG tablet Take 1 tablet (50 mg total) by mouth 3 (three)  times daily as needed for anxiety. 270 tablet 1  . ibuprofen (ADVIL,MOTRIN) 200 MG tablet Take 400 mg by mouth every 4 (four) hours as needed.    Javier Docker Oil 500 MG CAPS Take 500 mg by mouth daily.    Marland Kitchen lamoTRIgine (LAMICTAL) 150 MG tablet Take 1 tablet (150 mg total) by mouth 2 (two) times daily. 180 tablet 1  . levothyroxine (SYNTHROID, LEVOTHROID) 112 MCG tablet Take 1 tablet (112 mcg total) by mouth daily. 90 tablet 1  . loratadine (CLARITIN) 10 MG tablet 10 mg  prn as needed    . Magnesium 400 MG CAPS Take 400 mg by mouth 2 (two) times daily.     . Multiple Vitamin (MULTIVITAMIN WITH MINERALS) TABS tablet Take 1 tablet by mouth daily.    . mupirocin ointment (BACTROBAN) 2 % Place 1 application into the nose 2 (two) times daily. 22 g 1  . tiZANidine (ZANAFLEX) 4 MG tablet Take 1 tablet (4 mg total) by mouth 3 (three) times daily as needed. 90 tablet 1  . traZODone (DESYREL) 100 MG tablet Take 2 tablets (200 mg total) by mouth at bedtime. 180 tablet 1  . venlafaxine (EFFEXOR) 100 MG tablet Take 1 tablet (100 mg total) by mouth 3 (three) times daily with meals. 270 tablet 1  . zoledronic acid (RECLAST) 5 MG/100ML SOLN injection Inject 5 mg into the vein See admin instructions. Patient takes yearly     No current facility-administered medications for this visit.      Musculoskeletal: Strength & Muscle Tone: within normal limits Gait & Station: normal Patient leans: N/A  Psychiatric Specialty Exam: ROS  Blood pressure 138/88, pulse 79, height 5\' 5"  (1.651 m), weight (!) 325 lb 9.6 oz (147.7 kg), SpO2 94 %.Body mass index is  54.18 kg/m.  General Appearance: Casual and Fairly Groomed  Eye Contact:  Fair  Speech:  Clear and Coherent and Normal Rate  Volume:  Normal  Mood:  Anxious and Dysphoric  Affect:  Congruent  Thought Process:  Goal Directed and Descriptions of Associations: Intact  Orientation:  Full (Time, Place, and Person)  Thought Content: Logical   Suicidal Thoughts:  No  Homicidal Thoughts:  No  Memory:  Immediate;   Fair  Judgement:  Poor  Insight:  Shallow  Psychomotor Activity:  Normal  Concentration:  Concentration: Fair  Recall:  [employer] of Knowledge: Fair  Language: Fair  Akathisia:  Negative  Handed:  Right  AIMS (if indicated): not done  Assets:  Communication Skills Desire for Improvement Financial Resources/Insurance Housing  ADL's:  Intact  Cognition: WNL  Sleep:  Good   Screenings: AUDIT     Admission (Discharged) from 02/26/2014 in [facility]  Alcohol Use Disorder Identification Test Final Score (AUDIT)  0    ECT-MADRS     ECT Treatment from 06/08/2017 in [facility] ECT Treatment from 05/30/2017 in [facility] ECT Treatment from 05/23/2017 in [facility]  MADRS Total Score  20  22  38    GAD-7     Counselor from 03/08/2017 in [facility] Counselor from 02/26/2017 in [facility]  Total GAD-7 Score  7  8    Mini-Mental     ECT Treatment from 06/08/2017 in [facility] ECT Treatment from 05/30/2017 in [facility] ECT Treatment from 05/23/2017  in [place]  Total Score (max 30 points )  30  30  30     PHQ2-9     Office Visit from 11/13/2017 in [facility] Office Visit from 08/07/2017 in [facility] Counselor from 03/08/2017 in [facility] Counselor from 02/26/2017 in [facility]  PHQ-2 Total Score  6  2  5  6   PHQ-9 Total Score  18  9  16  21        Assessment and Plan: LANAE FEDERER is a 57 year old female with severe borderline personality disorder and major depressive disorder.  Continues to engage in impulsive unsafe behaviors, including taking too much of medications.  This has prompted an even higher level of oversight and monitoring by her husband, doling out medications on a day-to-day basis for her.  She does not have any suicidal thoughts and does not intend to harm herself, but continues to seek any medication that she thinks may help stimulate her energy levels.  She continues to be quite fixated on Provigil as something that would help her with her energy and motivation, but she has a history of significant overdose on Provigil necessitating ICU admission.  Patient continues to be incredibly irresponsible and high risk with her medications, and I once again redirected her back to her goals of establishing better safety habits, expressing herself more fluidly in interpersonal relationships, and reducing self-harm behaviors.  She does not have any acute suicidality, and agrees to participate in individual and DBT therapies.  1. Chronic post-traumatic stress disorder (PTSD)   2. Borderline personality disorder ([place])   3. Major depressive disorder, recurrent, severe without psychotic features ([place])     Status of current problems: unchanged  Labs Ordered: No orders of the defined types were placed in this encounter.   Labs Reviewed: NA  Collateral Obtained/Records Reviewed: Husband is present and able to corroborate recent events, corroborates no acute SI or concerns about safety at this time  Plan:  Recommend husband have complete control of her medications, as they are currently doing Continue current doses of Effexor, trazodone,  Lamictal, Vistaril, and BuSpar Clonazepam available for emergency anxiety as a fire extinguisher if needed Referral for individual therapy at [facility] Referral to [facility] [place] for DBT Patient will transition to new provider at this clinic after writer's departure in August  I spent 25 minutes with the patient in direct face-to-face clinical care.  Greater than 50% of this time was spent in counseling and coordination of care with the patient.    Aundra Dubin, MD 12/19/2017, 3:48 PM

## 2018-01-03 ENCOUNTER — Ambulatory Visit: Payer: Medicare HMO | Admitting: Nurse Practitioner

## 2018-01-03 ENCOUNTER — Encounter: Payer: Self-pay | Admitting: Nurse Practitioner

## 2018-01-03 VITALS — BP 136/87 | HR 73 | Temp 97.1°F | Resp 16 | Ht 65.0 in | Wt 328.4 lb

## 2018-01-03 DIAGNOSIS — B353 Tinea pedis: Secondary | ICD-10-CM | POA: Diagnosis not present

## 2018-01-03 DIAGNOSIS — R198 Other specified symptoms and signs involving the digestive system and abdomen: Secondary | ICD-10-CM

## 2018-01-03 MED ORDER — CICLOPIROX 8 % EX SOLN: CUTANEOUS | 2 refills | Status: DC

## 2018-01-03 NOTE — Progress Notes (Signed)
[facility] [place], [place] [address]  Internal MEDICINE  Office Visit Note  Patient Name: Jamie Bennett  [id number]  [id number]  Date of Service: 01/23/2018   Pt is here for a sick visit.  Chief Complaint  Patient presents with  . naval pain    still no resolving , also right lateral side foot pain     Patient has been having irritation and bleeding from her naval for a few weeks. Is gradually becoming worse. Bleeding is now most significant after she showers. She is still not having pain or rash associated with this bleeding. She has not noted any lesion on or around the abdomen or naval.        Current Medication:  Outpatient Encounter Medications as of 01/03/2018  Medication Sig Note  . aspirin EC 81 MG tablet Take 81 mg by mouth daily.   . busPIRone (BUSPAR) 15 MG tablet Take 1 tablet (15 mg total) by mouth 4 (four) times daily.   . Cholecalciferol (HM VITAMIN D3) 4000 units CAPS Take 4,000 Units by mouth daily.    . ciclopirox (PENLAC) 8 % solution Apply to affected nail and surrounding nail bed QHS   . clonazePAM (KLONOPIN) 0.5 MG tablet Take 1 tablet (0.5 mg total) by mouth 2 (two) times daily. (Patient taking differently: Take 0.5 mg by mouth as needed. )   . Coenzyme Q-10 200 MG CAPS Take 200 mg by mouth daily.   . cyanocobalamin (,VITAMIN B-12,) 1000 MCG/ML injection Inject 1,000 mcg into the muscle every 30 (thirty) days.   Marland Kitchen docusate calcium (SURFAK) 240 MG capsule Take by mouth.   . esomeprazole (NEXIUM) 40 MG capsule Take 1 capsule (40 mg total) by mouth daily at 12 noon.   . Fiber, Guar Gum, CHEW Chew 10 mg by mouth daily.   . hydroxychloroquine (PLAQUENIL) 200 MG tablet Take 1 tablet by mouth twice a day Monday through Friday   . hydrOXYzine (ATARAX/VISTARIL) 50 MG tablet Take 1 tablet (50 mg total) by mouth 3 (three) times daily as needed for anxiety.   Marland Kitchen ibuprofen (ADVIL,MOTRIN) 200 MG tablet Take 400 mg by mouth every 4  (four) hours as needed.   Javier Docker Oil 500 MG CAPS Take 500 mg by mouth daily.   Marland Kitchen lamoTRIgine (LAMICTAL) 150 MG tablet Take 1 tablet (150 mg total) by mouth 2 (two) times daily.   Marland Kitchen levothyroxine (SYNTHROID, LEVOTHROID) 112 MCG tablet Take 1 tablet (112 mcg total) by mouth daily.   Marland Kitchen loratadine (CLARITIN) 10 MG tablet 10 mg  prn as needed 03/02/2017: Takes only as needed.   . Magnesium 400 MG CAPS Take 400 mg by mouth 2 (two) times daily.    . Multiple Vitamin (MULTIVITAMIN WITH MINERALS) TABS tablet Take 1 tablet by mouth daily.   . mupirocin ointment (BACTROBAN) 2 % Place 1 application into the nose 2 (two) times daily.   . traZODone (DESYREL) 100 MG tablet Take 2 tablets (200 mg total) by mouth at bedtime.   Marland Kitchen venlafaxine (EFFEXOR) 100 MG tablet Take 1 tablet (100 mg total) by mouth 3 (three) times daily with meals.   . zoledronic acid (RECLAST) 5 MG/100ML SOLN injection Inject 5 mg into the vein See admin instructions. Patient takes yearly   . [DISCONTINUED] tiZANidine (ZANAFLEX) 4 MG tablet Take 1 tablet (4 mg total) by mouth 3 (three) times daily as needed.    No facility-administered encounter medications on file as of 01/03/2018.  Medical History: Past Medical History:  Diagnosis Date  . Anemia   . Anxiety   . Bipolar 1 disorder ([place])   . Collagen vascular disease ([place])    rhematoid arthritis  . Constipation   . Fibromyalgia   . Hepatic steatosis   . History of Roux-en-Y gastric bypass   . Hypotension   . Hypothyroidism   . Opioid abuse ([place])   . Osteoarthritis   . Osteoporosis   . PONV (postoperative nausea and vomiting)    nausea only  . Rheumatic fever   . Rheumatoid arthritis ([place])   . Thyroid disease      Today's Vitals   01/03/18 1607  BP: 136/87  Pulse: 73  Resp: 16  Temp: (!) 97.1 F (36.2 C)  TempSrc: Oral  SpO2: 98%  Weight: (!) 328 lb 6.4 oz (149 kg)  Height: 5\' 5"  (1.651 m)    Review of Systems  Constitutional: Negative for activity  change, chills, fatigue and unexpected weight change.  HENT: Negative for congestion, postnasal drip, rhinorrhea, sneezing and sore throat.   Eyes: Negative.  Negative for redness.  Respiratory: Negative for cough, chest tightness, shortness of breath and wheezing.   Cardiovascular: Negative for chest pain and palpitations.  Gastrointestinal: Negative for abdominal pain, constipation, diarrhea, nausea and vomiting.  Endocrine: Negative for cold intolerance, heat intolerance, polydipsia, polyphagia and polyuria.       Well controlled thyroid issues.   Genitourinary: Negative.  Negative for dysuria and frequency.  Musculoskeletal: Positive for arthralgias, back pain and myalgias. Negative for joint swelling and neck pain.  Skin: Positive for rash.       Has noted some blood tinged discharge from her belly button. Getting to be worse and more frequent.still has no pain or odor present.  She has also noted thickened, yellow toenails. Has appointment with podiatrist upcoming, but onto for a few weeks.   Allergic/Immunologic: Negative for environmental allergies.  Neurological: Positive for dizziness and headaches. Negative for tremors and numbness.  Hematological: Negative for adenopathy. Does not bruise/bleed easily.  Psychiatric/Behavioral: Positive for dysphoric mood. Negative for behavioral problems (Depression), sleep disturbance and suicidal ideas. The patient is nervous/anxious.        Patient is regularly sees psychiatry.     Physical Exam  Constitutional: She is oriented to person, place, and time. She appears well-developed and well-nourished. No distress.  HENT:  Head: Normocephalic and atraumatic.  Nose: Nose normal.  Mouth/Throat: Oropharynx is clear and moist. No oropharyngeal exudate.  Eyes: Pupils are equal, round, and reactive to light. EOM are normal.  Neck: Normal range of motion. Neck supple. No JVD present. No tracheal deviation present. No thyromegaly present.   Cardiovascular: Normal rate, regular rhythm and normal heart sounds. Exam reveals no gallop and no friction rub.  No murmur heard. Pulmonary/Chest: Effort normal and breath sounds normal. No respiratory distress. She has no wheezes. She has no rales. She exhibits no tenderness.  Abdominal: Soft. Bowel sounds are normal. There is no tenderness.  Musculoskeletal: Normal range of motion.  Lymphadenopathy:    She has no cervical adenopathy.  Neurological: She is alert and oriented to person, place, and time. She displays normal reflexes. No cranial nerve deficit.  Skin: Skin is warm and dry. Capillary refill takes less than 2 seconds. She is not diaphoretic.  There is some redness and scabbing noted in the naval. Very mild erythema present. No current bleeding or drainage is noted. However, there is dried blood around the pending of  the [employer].   Psychiatric: Her speech is normal and behavior is normal. Judgment and thought content normal. Cognition and memory are normal. She exhibits a depressed mood.  Nursing note and vitals reviewed.  Assessment/Plan: 1. Umbilical bleeding Unsure of cause, but there is concern for liver disease or portal hypertension. Will get abdominal ultrasound for further evaluation . - US Abdomen Complete; Future  2. Tinea pedis of both feet penlac topical solution should be applied to all affected naisl every night. Should keep upcoming appointment with podiatry if symptoms are persistent.  - ciclopirox (PENLAC) 8 % solution; Apply to affected nail and surrounding nail bed QHS  Dispense: 6.6 mL; Refill: 2  General Counseling: Isobel verbalizes understanding of the findings of todays visit and agrees with plan of treatment. I have discussed any further diagnostic evaluation that may be needed or ordered today. We also reviewed her medications today. she has been encouraged to call the office with any questions or concerns that should arise related to todays  visit.    Counseling:  This patient was seen by Leretha Pol, FNP- C in Collaboration with Dr Lavera Guise as a part of collaborative care agreement   Orders Placed This Encounter  Procedures  . US Abdomen Complete    Meds ordered this encounter  Medications  . ciclopirox (PENLAC) 8 % solution    Sig: Apply to affected nail and surrounding nail bed QHS    Dispense:  6.6 mL    Refill:  2    Order Specific Question:   Supervising Provider    Answer:   Lavera Guise [[id number]]    Time spent: 15 Minutes

## 2018-01-07 ENCOUNTER — Telehealth: Payer: Self-pay

## 2018-01-09 ENCOUNTER — Other Ambulatory Visit: Payer: Self-pay | Admitting: Nurse Practitioner

## 2018-01-09 ENCOUNTER — Emergency Department: Payer: Medicare HMO

## 2018-01-09 ENCOUNTER — Other Ambulatory Visit: Payer: Self-pay

## 2018-01-09 ENCOUNTER — Emergency Department
Admission: EM | Admit: 2018-01-09 | Discharge: 2018-01-09 | Disposition: A | Discharge status: Home / Self Care | Payer: Medicare HMO | Attending: Emergency Medicine | Admitting: Emergency Medicine

## 2018-01-09 ENCOUNTER — Encounter: Payer: Self-pay | Admitting: Emergency Medicine

## 2018-01-09 DIAGNOSIS — Z7982 Long term (current) use of aspirin: Secondary | ICD-10-CM | POA: Insufficient documentation

## 2018-01-09 DIAGNOSIS — Z79899 Other long term (current) drug therapy: Secondary | ICD-10-CM | POA: Insufficient documentation

## 2018-01-09 DIAGNOSIS — E039 Hypothyroidism, unspecified: Secondary | ICD-10-CM | POA: Insufficient documentation

## 2018-01-09 DIAGNOSIS — L209 Atopic dermatitis, unspecified: Secondary | ICD-10-CM

## 2018-01-09 DIAGNOSIS — R109 Unspecified abdominal pain: Secondary | ICD-10-CM | POA: Diagnosis not present

## 2018-01-09 DIAGNOSIS — R1084 Generalized abdominal pain: Secondary | ICD-10-CM | POA: Diagnosis not present

## 2018-01-09 LAB — URINALYSIS, COMPLETE (UACMP) WITH MICROSCOPIC
Bilirubin Urine: NEGATIVE
Glucose, UA: NEGATIVE mg/dL
Ketones, ur: NEGATIVE mg/dL
Nitrite: NEGATIVE
Protein, ur: NEGATIVE mg/dL
Specific Gravity, Urine: 1.012 (ref 1.005–1.030)
pH: 6 (ref 5.0–8.0)

## 2018-01-09 LAB — COMPREHENSIVE METABOLIC PANEL
ALT: 17 U/L (ref 14–54)
AST: 23 U/L (ref 15–41)
Albumin: 3.9 g/dL (ref 3.5–5.0)
Alkaline Phosphatase: 119 U/L (ref 38–126)
Anion gap: 7 (ref 5–15)
BUN: 23 mg/dL — ABNORMAL HIGH (ref 6–20)
CO2: 23 mmol/L (ref 22–32)
Calcium: 9 mg/dL (ref 8.9–10.3)
Chloride: 111 mmol/L (ref 101–111)
Creatinine, Ser: 0.93 mg/dL (ref 0.44–1.00)
GFR calc Af Amer: >60 mL/min (ref >60)
GFR calc non Af Amer: >60 mL/min (ref >60)
Glucose, Bld: 105 mg/dL — ABNORMAL HIGH (ref 65–99)
Potassium: 4 mmol/L (ref 3.5–5.1)
Sodium: 141 mmol/L (ref 135–145)
Total Bilirubin: 0.3 mg/dL (ref 0.3–1.2)
Total Protein: 7.1 g/dL (ref 6.5–8.1)

## 2018-01-09 LAB — CBC
HCT: 37.5 % (ref 35.0–47.0)
Hemoglobin: 12.8 g/dL (ref 12.0–16.0)
MCH: 30.7 pg (ref 26.0–34.0)
MCHC: 34.1 g/dL (ref 32.0–36.0)
MCV: 90.3 fL (ref 80.0–100.0)
Platelets: 239 10*3/uL (ref 150–440)
RBC: 4.16 MIL/uL (ref 3.80–5.20)
RDW: 14.4 % (ref 11.5–14.5)
WBC: 7 10*3/uL (ref 3.6–11.0)

## 2018-01-09 LAB — LIPASE, BLOOD: Lipase: 38 U/L (ref 11–51)

## 2018-01-09 MED ORDER — MORPHINE SULFATE (PF) 2 MG/ML IV SOLN
INTRAVENOUS | Status: AC
  Administered 2018-01-09: 2 mg via INTRAVENOUS
  Filled 2018-01-09: qty 1

## 2018-01-09 MED ORDER — SULFAMETHOXAZOLE-TRIMETHOPRIM 800-160 MG PO TABS: 1.0000 | ORAL_TABLET | Freq: Two times a day (BID) | ORAL | 0 refills | Status: DC

## 2018-01-09 MED ORDER — NYSTATIN 100000 UNIT/GM EX OINT: 1.0000 "application " | TOPICAL_OINTMENT | Freq: Two times a day (BID) | CUTANEOUS | 0 refills | Status: DC

## 2018-01-09 MED ORDER — ONDANSETRON HCL 4 MG/2ML IJ SOLN
INTRAMUSCULAR | Status: AC
  Administered 2018-01-09: 4 mg via INTRAVENOUS
  Filled 2018-01-09: qty 2

## 2018-01-09 MED ORDER — SUCRALFATE 1 G PO TABS: 1.0000 g | ORAL_TABLET | Freq: Two times a day (BID) | ORAL | 1 refills | Status: DC

## 2018-01-09 MED ORDER — GI COCKTAIL ~~LOC~~
30.0000 mL | Freq: Once | ORAL | Status: AC
  Administered 2018-01-09: 30 mL via ORAL
  Filled 2018-01-09: qty 30

## 2018-01-09 MED ORDER — SUCRALFATE 1 G PO TABS
ORAL_TABLET | ORAL | Status: AC
  Filled 2018-01-09: qty 1

## 2018-01-09 MED ORDER — SUCRALFATE 1 G PO TABS
1.0000 g | ORAL_TABLET | Freq: Once | ORAL | Status: AC
  Administered 2018-01-09: 1 g via ORAL

## 2018-01-09 MED ORDER — MORPHINE SULFATE (PF) 2 MG/ML IV SOLN
2.0000 mg | Freq: Once | INTRAVENOUS | Status: AC
  Administered 2018-01-09: 2 mg via INTRAVENOUS

## 2018-01-09 MED ORDER — IOHEXOL 300 MG/ML  SOLN
125.0000 mL | Freq: Once | INTRAMUSCULAR | Status: AC | PRN
  Administered 2018-01-09: 125 mL via INTRAVENOUS

## 2018-01-09 MED ORDER — ONDANSETRON HCL 4 MG/2ML IJ SOLN
4.0000 mg | Freq: Once | INTRAMUSCULAR | Status: AC
  Administered 2018-01-09: 4 mg via INTRAVENOUS

## 2018-01-09 NOTE — ED Provider Notes (Signed)
[facility] Emergency Department Provider Note   First MD Initiated Contact with Patient 01/09/18 0411     (approximate)  I have reviewed the triage vital signs and the nursing notes.   HISTORY  Chief Complaint Abdominal Pain    HPI Jamie Bennett is a 57 y.o. female with below list of chronic medical conditions including fibromyalgia gastric bypass surgery and previous episodes of abdominal discomfort without known etiology presents to the emergency department with generalized abdominal pain that the patient states is currently 10 out of 10.  Patient states that the pain is periumbilical and predominately in the right side of her abdomen at this time.  Patient admits to nausea however no vomiting.  Patient denies any fever.  Patient denies any urinary symptoms.   Past Medical History:  Diagnosis Date  . Anemia   . Anxiety   . Bipolar 1 disorder ([place])   . Collagen vascular disease ([place])    rhematoid arthritis  . Constipation   . Fibromyalgia   . Hepatic steatosis   . History of Roux-en-Y gastric bypass   . Hypotension   . Hypothyroidism   . Opioid abuse ([place])   . Osteoarthritis   . Osteoporosis   . PONV (postoperative nausea and vomiting)    nausea only  . Rheumatic fever   . Rheumatoid arthritis ([place])   . Thyroid disease     Patient Active Problem List   Diagnosis Date Noted  . Atopic dermatitis 12/05/2017  . Primary osteoarthritis of both knees 01/19/2017  . Suicide attempt ([place]) 07/10/2016  . Fibromyalgia 07/10/2016  . High risk medication use 07/10/2016  . AKI (acute kidney injury) ([place]) 11/09/2015  . Elevated troponin 11/09/2015  . Hypotension 11/09/2015  . Respiratory failure ([place])   . Acute hepatic failure 11/08/2015  . Drug overdose 11/08/2015  . Fatty infiltration of liver 02/16/2015  . Hepatic fibrosis 02/16/2015  . Abnormal serum level of alkaline phosphatase 02/15/2015  . Iron deficiency anemia 02/05/2015  . Vitamin  B 12 deficiency 02/05/2015  . OP (osteoporosis) 06/16/2014  . Rheumatoid arteritis 03/02/2014  . Osteoarthritis of left hip 03/02/2014  . Hypothyroidism 03/02/2014  . Rheumatic fever without heart involvement 03/02/2014  . Adult hypothyroidism 03/02/2014  . Arthritis of pelvic region, degenerative 03/02/2014  . Bipolar 1 disorder, depressed ([place]) 02/26/2014  . Bariatric surgery status 11/24/2013  . Affective bipolar disorder ([place]) 11/24/2013  . Bipolar affective disorder ([place]) 11/24/2013  . Rheumatoid arthritis ([place]) 11/24/2013  . Polysubstance (excluding opioids) dependence ([place]) 09/11/2013  . Polysubstance dependence ([place]) 09/11/2013  . Combined drug dependence excluding opioids ([place]) 09/11/2013  . Arthritis or polyarthritis, rheumatoid ([place]) 09/05/2013  . Leg weakness 09/05/2013    Past Surgical History:  Procedure Laterality Date  . CHOLECYSTECTOMY    . COLONOSCOPY N/A 07/16/2017   Procedure: COLONOSCOPY;  Surgeon: Manya Silvas, MD;  Location: [facility] ENDOSCOPY;  Service: Endoscopy;  Laterality: N/A;  . GASTRIC BYPASS    . TONSILLECTOMY      Prior to Admission medications   Medication Sig Start Date End Date Taking? Authorizing Provider  aspirin EC 81 MG tablet Take 81 mg by mouth daily.    [provider]  busPIRone (BUSPAR) 15 MG tablet Take 1 tablet (15 mg total) by mouth 4 (four) times daily. 11/20/17 11/20/18  Aundra Dubin, MD  Cholecalciferol (HM VITAMIN D3) 4000 units CAPS Take 4,000 Units by mouth daily.     [provider]  ciclopirox (PENLAC) 8 %  solution Apply to affected nail and surrounding nail bed QHS 01/03/18   Boscia, Greer Ee, NP  clonazePAM (KLONOPIN) 0.5 MG tablet Take 1 tablet (0.5 mg total) by mouth 2 (two) times daily. Patient taking differently: Take 0.5 mg by mouth as needed.  07/10/17 07/10/18  Aundra Dubin, MD  Coenzyme Q-10 200 MG CAPS Take 200 mg by mouth daily.    [provider]  cyanocobalamin  (,VITAMIN B-12,) 1000 MCG/ML injection Inject 1,000 mcg into the muscle every 30 (thirty) days.    [provider]  docusate calcium (SURFAK) 240 MG capsule Take by mouth.    [provider]  esomeprazole (NEXIUM) 40 MG capsule Take 1 capsule (40 mg total) by mouth daily at 12 noon. 03/04/14   Withrow, Elyse Jarvis, FNP  Fiber, Guar Gum, CHEW Chew 10 mg by mouth daily.    [provider]  hydroxychloroquine (PLAQUENIL) 200 MG tablet Take 1 tablet by mouth twice a day Monday through Friday 12/17/17   Bo Merino, MD  hydrOXYzine (ATARAX/VISTARIL) 50 MG tablet Take 1 tablet (50 mg total) by mouth 3 (three) times daily as needed for anxiety. 10/09/17   Eksir, Richard Miu, MD  ibuprofen (ADVIL,MOTRIN) 200 MG tablet Take 400 mg by mouth every 4 (four) hours as needed.    [provider]  Javier Docker Oil 500 MG CAPS Take 500 mg by mouth daily.    [provider]  lamoTRIgine (LAMICTAL) 150 MG tablet Take 1 tablet (150 mg total) by mouth 2 (two) times daily. 10/09/17   Eksir, Richard Miu, MD  levothyroxine (SYNTHROID, LEVOTHROID) 112 MCG tablet Take 1 tablet (112 mcg total) by mouth daily. 11/19/17   Ronnell Freshwater, NP  loratadine (CLARITIN) 10 MG tablet 10 mg  prn as needed 09/12/10   [provider]  Magnesium 400 MG CAPS Take 400 mg by mouth 2 (two) times daily.     [provider]  Multiple Vitamin (MULTIVITAMIN WITH MINERALS) TABS tablet Take 1 tablet by mouth daily.    [provider]  mupirocin ointment (BACTROBAN) 2 % Place 1 application into the nose 2 (two) times daily. 11/13/17   Ronnell Freshwater, NP  tiZANidine (ZANAFLEX) 4 MG tablet Take 1 tablet (4 mg total) by mouth 3 (three) times daily as needed. 10/31/17   Ronnell Freshwater, NP  traZODone (DESYREL) 100 MG tablet Take 2 tablets (200 mg total) by mouth at bedtime. 10/09/17   Aundra Dubin, MD  venlafaxine (EFFEXOR) 100 MG tablet Take 1 tablet (100 mg total) by mouth 3  (three) times daily with meals. 10/03/17 10/03/18  Aundra Dubin, MD  zoledronic acid (RECLAST) 5 MG/100ML SOLN injection Inject 5 mg into the vein See admin instructions. Patient takes yearly    [provider]    Allergies Lactose intolerance (gi)  Family History  Problem Relation Age of Onset  . Depression Mother   . Dementia Mother   . Heart disease Mother   . Parkinson's disease Father     Social History Social History   Tobacco Use  . Smoking status: Never Smoker  . Smokeless tobacco: Never Used  Substance Use Topics  . Alcohol use: No    Alcohol/week: 0.0 oz  . Drug use: No    Review of Systems Constitutional: No fever/chills Eyes: No visual changes. ENT: No sore throat. Cardiovascular: Denies chest pain. Respiratory: Denies shortness of breath. Gastrointestinal: Positive for abdominal pain and nausea no vomiting.  No diarrhea.  No constipation. Genitourinary: Negative for dysuria. Musculoskeletal: Negative for neck pain.  Negative for back pain. Integumentary: Negative for rash. Neurological: Negative for headaches, focal weakness or numbness.   ____________________________________________   PHYSICAL EXAM:  VITAL SIGNS: ED Triage Vitals  Enc Vitals Group     BP 01/09/18 0347 135/73     Pulse Rate 01/09/18 0347 80     Resp 01/09/18 0347 20     Temp 01/09/18 0347 98.3 F (36.8 C)     Temp Source 01/09/18 0347 Oral     SpO2 01/09/18 0347 95 %     Weight 01/09/18 0347 (!) 148.8 kg (328 lb)     Height 01/09/18 0347 1.651 m (5\' 5" )     Head Circumference --      Peak Flow --      Pain Score 01/09/18 0345 10     Pain Loc --      Pain Edu? --      Excl. in [place]? --     Constitutional: Alert and oriented.  Apparent discomfort  eyes: Conjunctivae are normal.  Head: Atraumatic. Mouth/Throat: Mucous membranes are moist.  Oropharynx non-erythematous. Neck: No stridor.   Cardiovascular: Normal rate, regular rhythm. Good peripheral  circulation. Grossly normal heart sounds. Respiratory: Normal respiratory effort.  No retractions. Lungs CTAB. Gastrointestinal: Generalized tenderness to palpation no distention.  Musculoskeletal: No lower extremity tenderness nor edema. No gross deformities of extremities. Neurologic:  Normal speech and language. No gross focal neurologic deficits are appreciated.  Skin:  Skin is warm, dry and intact. No rash noted. Psychiatric: Mood and affect are normal. Speech and behavior are normal.  ____________________________________________   LABS (all labs ordered are listed, but only abnormal results are displayed)  Labs Reviewed  COMPREHENSIVE METABOLIC PANEL - Abnormal; Notable for the following components:      Result Value   Glucose, Bld 105 (*)    BUN 23 (*)    All other components within normal limits  URINALYSIS, COMPLETE (UACMP) WITH MICROSCOPIC - Abnormal; Notable for the following components:   Color, Urine YELLOW (*)    APPearance CLEAR (*)    Hgb urine dipstick LARGE (*)    Leukocytes, UA TRACE (*)    Bacteria, UA RARE (*)    All other components within normal limits  LIPASE, BLOOD  CBC     RADIOLOGY I, [place] N Jull Harral, personally viewed and evaluated these images (plain radiographs) as part of my medical decision making, as well as reviewing the written report by the radiologist.  ED MD interpretation: No acute findings in the abdomen and pelvis noted on CT scan per radiologist  Official radiology report(s): Ct Abdomen Pelvis W Contrast  Result Date: 01/09/2018 CLINICAL DATA:  Acute abdominal pain. EXAM: CT ABDOMEN AND PELVIS WITH CONTRAST TECHNIQUE: Multidetector CT imaging of the abdomen and pelvis was performed using the standard protocol following bolus administration of intravenous contrast. CONTRAST:  131mL OMNIPAQUE IOHEXOL 300 MG/ML  SOLN COMPARISON:  CT virtual colonoscopy 08/21/2017. Abdominal CT 06/03/2015 FINDINGS: Lower chest: Tiny subpleural nodule in  the right lower lobe is stable dating back to 2016 and considered benign. No pleural fluid. No consolidation. Hepatobiliary: Prominent size liver spanning 20 cm cranial caudal. No discrete focal lesion. Postcholecystectomy with mild biliary prominence, stable from prior exams. Pancreas: Parenchymal atrophy. No ductal dilatation or inflammation. Spleen: Normal in size without focal abnormality. Adrenals/Urinary Tract: No adrenal nodule. No hydronephrosis or perinephric edema. Homogeneous renal enhancement. Only minimal excretion from both kidneys on  delayed phase imaging. Urinary bladder is physiologically distended without wall thickening. Stomach/Bowel: Gastric bypass without dilatation of the Roux limb. Minimal fluid in the excluded gastric remnant. No bowel obstruction, bowel wall thickening, or inflammatory change. Jejunal anastomosis in the left abdomen is unremarkable. Moderate stool burden throughout the colon without colonic wall thickening or inflammatory change. Appendix not confidently visualized. Vascular/Lymphatic: Normal caliber abdominal aorta. Portal vein and mesenteric vessels are patent. Few prominent portal caval nodes likely reactive. Reproductive: Uterus and bilateral adnexa are unremarkable. Other: No free air, free fluid, or intra-abdominal fluid collection. Musculoskeletal: There are no acute or suspicious osseous abnormalities. IMPRESSION: 1. No acute findings in the abdomen or pelvis. 2. Post gastric bypass without complication. Moderate colonic stool burden can be seen with constipation. No bowel obstruction or inflammatory change. Electronically Signed   By: Jeb Levering M.D.   On: 01/09/2018 04:59     Procedures   ____________________________________________   INITIAL IMPRESSION / ASSESSMENT AND PLAN / ED COURSE  As part of my medical decision making, I reviewed the following data within the electronic MEDICAL RECORD NUMBER   57 year old female presenting with above-stated  history and physical exam secondary to abdominal pain and nausea.  Considered possibility of bowel obstruction versus appendicitis or other potential etiology and as such CT scan of the abdomen pelvis was performed which was unremarkable.  On reevaluation patient states that the pain in the right side of her abdomen was now completely resolved and that she had pain in the epigastric region.  As such patient was given Carafate.  Patient referred to gastroenterology for further outpatient evaluation ____________________________________________  FINAL CLINICAL IMPRESSION(S) / ED DIAGNOSES  Final diagnoses:  Generalized abdominal pain     MEDICATIONS GIVEN DURING THIS VISIT:  Medications  morphine 2 MG/ML injection 2 mg (2 mg Intravenous Given 01/09/18 0425)  ondansetron (ZOFRAN) injection 4 mg (4 mg Intravenous Given 01/09/18 0425)  iohexol (OMNIPAQUE) 300 MG/ML solution 125 mL (125 mLs Intravenous Contrast Given 01/09/18 0441)  gi cocktail (Maalox,Lidocaine,Donnatal) (30 mLs Oral Given 01/09/18 0559)  sucralfate (CARAFATE) tablet 1 g (1 g Oral Given 01/09/18 0559)     ED Discharge Orders    None       Note:  This document was prepared using Dragon voice recognition software and may include unintentional dictation errors.    Gregor Hams, MD 01/11/18 [phone]

## 2018-01-09 NOTE — ED Notes (Signed)

## 2018-01-09 NOTE — ED Triage Notes (Addendum)
Patient ambulatory to triage with steady gait, without difficulty or distress noted; pt reports pain to navel & lower abd with no accomp symptoms

## 2018-01-09 NOTE — Telephone Encounter (Signed)
Pt advised that we send antibiotic and ointment to her phar also pt went to the Er and they did cscan  Normal they don't gave her any antibiotic refer her to GI

## 2018-01-09 NOTE — Telephone Encounter (Signed)
Continued and worsening bleeding from the naval. Sent bactrim DS bid for 14 days. Will add nystatin ointment which soul be applied twice daily. May be mixed with mupirocin ointment and applied. If this continues to worsen, may refer to surgery for further evaluation and treatment.

## 2018-01-09 NOTE — ED Notes (Signed)
Patient transported to CT 

## 2018-01-09 NOTE — Progress Notes (Signed)
Continued and worsening bleeding from the naval. Sent bactrim DS bid for 14 days. Will add nystatin ointment which soul be applied twice daily. May be mixed with mupirocin ointment and applied. If this continues to worsen, may refer to surgery for further evaluation and treatment.

## 2018-01-10 DIAGNOSIS — R198 Other specified symptoms and signs involving the digestive system and abdomen: Secondary | ICD-10-CM | POA: Diagnosis not present

## 2018-01-10 DIAGNOSIS — M79651 Pain in right thigh: Secondary | ICD-10-CM | POA: Diagnosis not present

## 2018-01-14 ENCOUNTER — Inpatient Hospital Stay: Payer: Medicare HMO | Attending: Hematology and Oncology

## 2018-01-14 ENCOUNTER — Inpatient Hospital Stay: Payer: Medicare HMO

## 2018-01-14 DIAGNOSIS — Z79899 Other long term (current) drug therapy: Secondary | ICD-10-CM | POA: Insufficient documentation

## 2018-01-14 DIAGNOSIS — D509 Iron deficiency anemia, unspecified: Secondary | ICD-10-CM | POA: Diagnosis not present

## 2018-01-14 DIAGNOSIS — E538 Deficiency of other specified B group vitamins: Secondary | ICD-10-CM | POA: Insufficient documentation

## 2018-01-14 LAB — CBC WITH DIFFERENTIAL/PLATELET
Basophils Absolute: 0 10*3/uL (ref 0–0.1)
Basophils Relative: 1 %
Eosinophils Absolute: 0.2 10*3/uL (ref 0–0.7)
Eosinophils Relative: 4 %
HCT: 37.2 % (ref 35.0–47.0)
Hemoglobin: 12.6 g/dL (ref 12.0–16.0)
Lymphocytes Relative: 31 %
Lymphs Abs: 1.2 10*3/uL (ref 1.0–3.6)
MCH: 30.7 pg (ref 26.0–34.0)
MCHC: 33.9 g/dL (ref 32.0–36.0)
MCV: 90.4 fL (ref 80.0–100.0)
Monocytes Absolute: 0.4 10*3/uL (ref 0.2–0.9)
Monocytes Relative: 10 %
Neutro Abs: 2.1 10*3/uL (ref 1.4–6.5)
Neutrophils Relative %: 54 %
Platelets: 249 10*3/uL (ref 150–440)
RBC: 4.12 MIL/uL (ref 3.80–5.20)
RDW: 13.9 % (ref 11.5–14.5)
WBC: 3.8 10*3/uL (ref 3.6–11.0)

## 2018-01-14 LAB — FERRITIN: Ferritin: 35 ng/mL (ref 11–307)

## 2018-01-14 MED ORDER — CYANOCOBALAMIN 1000 MCG/ML IJ SOLN
1000.0000 ug | Freq: Once | INTRAMUSCULAR | Status: AC
  Administered 2018-01-14: 1000 ug via INTRAMUSCULAR

## 2018-01-15 ENCOUNTER — Telehealth: Payer: Self-pay

## 2018-01-15 NOTE — Telephone Encounter (Signed)
PT CALLED THAT SHE WENT TO GI DR AND THEY PRES HER CLIDAMYCIN AND TOLD HER TO STOPPED SULFA WHAT WE PRES HER AS PER ITS OK TO STOPPED SULFA AND CONTINUE CLINDAMYCIN

## 2018-01-17 ENCOUNTER — Other Ambulatory Visit: Payer: Self-pay

## 2018-01-17 MED ORDER — TIZANIDINE HCL 4 MG PO TABS: 4.0000 mg | ORAL_TABLET | Freq: Three times a day (TID) | ORAL | 1 refills | Status: DC | PRN

## 2018-01-18 DIAGNOSIS — M779 Enthesopathy, unspecified: Secondary | ICD-10-CM | POA: Diagnosis not present

## 2018-01-18 DIAGNOSIS — M2041 Other hammer toe(s) (acquired), right foot: Secondary | ICD-10-CM | POA: Diagnosis not present

## 2018-01-18 DIAGNOSIS — Q6689 Other  specified congenital deformities of feet: Secondary | ICD-10-CM | POA: Diagnosis not present

## 2018-01-23 ENCOUNTER — Encounter: Payer: Self-pay | Admitting: Nurse Practitioner

## 2018-01-23 DIAGNOSIS — R198 Other specified symptoms and signs involving the digestive system and abdomen: Secondary | ICD-10-CM | POA: Insufficient documentation

## 2018-01-23 DIAGNOSIS — B353 Tinea pedis: Secondary | ICD-10-CM | POA: Insufficient documentation

## 2018-01-23 DIAGNOSIS — B354 Tinea corporis: Secondary | ICD-10-CM | POA: Insufficient documentation

## 2018-01-24 DIAGNOSIS — F4010 Social phobia, unspecified: Secondary | ICD-10-CM | POA: Diagnosis not present

## 2018-01-24 DIAGNOSIS — F603 Borderline personality disorder: Secondary | ICD-10-CM | POA: Diagnosis not present

## 2018-01-24 DIAGNOSIS — R69 Illness, unspecified: Secondary | ICD-10-CM | POA: Diagnosis not present

## 2018-01-28 DIAGNOSIS — R69 Illness, unspecified: Secondary | ICD-10-CM | POA: Diagnosis not present

## 2018-01-28 DIAGNOSIS — F4010 Social phobia, unspecified: Secondary | ICD-10-CM | POA: Diagnosis not present

## 2018-01-28 DIAGNOSIS — F603 Borderline personality disorder: Secondary | ICD-10-CM | POA: Diagnosis not present

## 2018-01-30 ENCOUNTER — Telehealth: Payer: Self-pay

## 2018-01-30 ENCOUNTER — Other Ambulatory Visit: Payer: Self-pay | Admitting: Nurse Practitioner

## 2018-01-30 DIAGNOSIS — R198 Other specified symptoms and signs involving the digestive system and abdomen: Secondary | ICD-10-CM

## 2018-01-30 DIAGNOSIS — L209 Atopic dermatitis, unspecified: Secondary | ICD-10-CM

## 2018-01-30 MED ORDER — CLOTRIMAZOLE-BETAMETHASONE 1-0.05 % EX CREA: 1.0000 "application " | TOPICAL_CREAM | Freq: Two times a day (BID) | CUTANEOUS | 1 refills | Status: DC

## 2018-01-30 MED ORDER — CIPROFLOXACIN HCL 500 MG PO TABS: 500.0000 mg | ORAL_TABLET | Freq: Two times a day (BID) | ORAL | 0 refills | Status: DC

## 2018-01-30 NOTE — Telephone Encounter (Signed)
PT WAS NOTIFIED TO NOT TAKE TIZANIDINE WITH CIPRO AND NOTIFIED OF THE REST.

## 2018-01-30 NOTE — Telephone Encounter (Signed)
The patient c/o worsenign bleeding from the naval. Reviewed CT scan done in ER showing no acute abnormalities. Scheduled for ultrasound of abdomen for further evaluation. Will start cipro 500mg  bid for 10 days. Also changed topical ointment to lotrisone which is twice daily. Both prescriptions were sent to [facility].

## 2018-01-30 NOTE — Progress Notes (Signed)
The patient c/o worsenign bleeding from the naval. Reviewed CT scan done in ER showing no acute abnormalities. Scheduled for ultrasound of abdomen for further evaluation. Will start cipro 500mg  bid for 10 days. Also changed topical ointment to lotrisone which is twice daily. Both prescriptions were sent to [facility].

## 2018-02-01 ENCOUNTER — Ambulatory Visit (INDEPENDENT_AMBULATORY_CARE_PROVIDER_SITE_OTHER): Payer: Medicare HMO

## 2018-02-01 DIAGNOSIS — R198 Other specified symptoms and signs involving the digestive system and abdomen: Secondary | ICD-10-CM | POA: Diagnosis not present

## 2018-02-05 DIAGNOSIS — F4010 Social phobia, unspecified: Secondary | ICD-10-CM | POA: Diagnosis not present

## 2018-02-05 DIAGNOSIS — R69 Illness, unspecified: Secondary | ICD-10-CM | POA: Diagnosis not present

## 2018-02-05 DIAGNOSIS — F603 Borderline personality disorder: Secondary | ICD-10-CM | POA: Diagnosis not present

## 2018-02-09 DIAGNOSIS — R1084 Generalized abdominal pain: Secondary | ICD-10-CM | POA: Diagnosis not present

## 2018-02-09 DIAGNOSIS — R1030 Lower abdominal pain, unspecified: Secondary | ICD-10-CM | POA: Insufficient documentation

## 2018-02-09 DIAGNOSIS — Z79899 Other long term (current) drug therapy: Secondary | ICD-10-CM | POA: Insufficient documentation

## 2018-02-09 DIAGNOSIS — Z7982 Long term (current) use of aspirin: Secondary | ICD-10-CM | POA: Diagnosis not present

## 2018-02-09 DIAGNOSIS — E039 Hypothyroidism, unspecified: Secondary | ICD-10-CM | POA: Diagnosis not present

## 2018-02-09 DIAGNOSIS — L0882 Omphalitis not of newborn: Secondary | ICD-10-CM | POA: Insufficient documentation

## 2018-02-09 DIAGNOSIS — R109 Unspecified abdominal pain: Secondary | ICD-10-CM | POA: Diagnosis not present

## 2018-02-09 LAB — LIPASE, BLOOD: Lipase: 35 U/L (ref 11–51)

## 2018-02-09 LAB — URINALYSIS, COMPLETE (UACMP) WITH MICROSCOPIC
Bacteria, UA: NONE SEEN
Bilirubin Urine: NEGATIVE
Glucose, UA: NEGATIVE mg/dL
Ketones, ur: NEGATIVE mg/dL
Leukocytes, UA: NEGATIVE
Nitrite: NEGATIVE
Protein, ur: NEGATIVE mg/dL
Specific Gravity, Urine: 1.011 (ref 1.005–1.030)
pH: 6 (ref 5.0–8.0)

## 2018-02-09 LAB — COMPREHENSIVE METABOLIC PANEL
ALT: 15 U/L (ref 0–44)
AST: 20 U/L (ref 15–41)
Albumin: 4.1 g/dL (ref 3.5–5.0)
Alkaline Phosphatase: 131 U/L — ABNORMAL HIGH (ref 38–126)
Anion gap: 9 (ref 5–15)
BUN: 14 mg/dL (ref 6–20)
CO2: 21 mmol/L — ABNORMAL LOW (ref 22–32)
Calcium: 9.1 mg/dL (ref 8.9–10.3)
Chloride: 111 mmol/L (ref 98–111)
Creatinine, Ser: 0.99 mg/dL (ref 0.44–1.00)
GFR calc Af Amer: >60 mL/min (ref >60)
GFR calc non Af Amer: >60 mL/min (ref >60)
Glucose, Bld: 95 mg/dL (ref 70–99)
Potassium: 3.9 mmol/L (ref 3.5–5.1)
Sodium: 141 mmol/L (ref 135–145)
Total Bilirubin: 0.4 mg/dL (ref 0.3–1.2)
Total Protein: 7.4 g/dL (ref 6.5–8.1)

## 2018-02-09 LAB — CBC
HCT: 39.8 % (ref 35.0–47.0)
Hemoglobin: 13.3 g/dL (ref 12.0–16.0)
MCH: 30.5 pg (ref 26.0–34.0)
MCHC: 33.4 g/dL (ref 32.0–36.0)
MCV: 91.5 fL (ref 80.0–100.0)
Platelets: 277 10*3/uL (ref 150–440)
RBC: 4.35 MIL/uL (ref 3.80–5.20)
RDW: 13.6 % (ref 11.5–14.5)
WBC: 6.8 10*3/uL (ref 3.6–11.0)

## 2018-02-09 NOTE — ED Triage Notes (Signed)
Patient c/o lower abdominal pain X 2 months. Patient has been evaluated at this ED, GI for same. Patient reports bleeding from belly button for same time period. Patient has follow-up with dermatology scheduled for this issue in 3 weeks.

## 2018-02-10 ENCOUNTER — Encounter: Payer: Self-pay | Admitting: Radiology

## 2018-02-10 ENCOUNTER — Emergency Department: Payer: Medicare HMO

## 2018-02-10 ENCOUNTER — Emergency Department
Admission: EM | Admit: 2018-02-10 | Discharge: 2018-02-10 | Disposition: A | Discharge status: Home / Self Care | Payer: Medicare HMO | Attending: Emergency Medicine | Admitting: Emergency Medicine

## 2018-02-10 DIAGNOSIS — Z79899 Other long term (current) drug therapy: Secondary | ICD-10-CM | POA: Diagnosis not present

## 2018-02-10 DIAGNOSIS — E039 Hypothyroidism, unspecified: Secondary | ICD-10-CM | POA: Diagnosis not present

## 2018-02-10 DIAGNOSIS — R109 Unspecified abdominal pain: Secondary | ICD-10-CM | POA: Diagnosis not present

## 2018-02-10 DIAGNOSIS — L0882 Omphalitis not of newborn: Secondary | ICD-10-CM

## 2018-02-10 DIAGNOSIS — R1084 Generalized abdominal pain: Secondary | ICD-10-CM

## 2018-02-10 DIAGNOSIS — Z7982 Long term (current) use of aspirin: Secondary | ICD-10-CM | POA: Diagnosis not present

## 2018-02-10 DIAGNOSIS — R1030 Lower abdominal pain, unspecified: Secondary | ICD-10-CM | POA: Diagnosis not present

## 2018-02-10 MED ORDER — DICYCLOMINE HCL 10 MG PO CAPS: 10.0000 mg | ORAL_CAPSULE | Freq: Four times a day (QID) | ORAL | 0 refills | Status: DC

## 2018-02-10 MED ORDER — DOXYCYCLINE HYCLATE 100 MG PO TABS: 100.0000 mg | ORAL_TABLET | Freq: Two times a day (BID) | ORAL | 0 refills | Status: DC

## 2018-02-10 MED ORDER — IOPAMIDOL (ISOVUE-300) INJECTION 61%
125.0000 mL | Freq: Once | INTRAVENOUS | Status: AC | PRN
  Administered 2018-02-10: 125 mL via INTRAVENOUS

## 2018-02-10 MED ORDER — HALOPERIDOL LACTATE 5 MG/ML IJ SOLN
2.5000 mg | Freq: Once | INTRAMUSCULAR | Status: AC
  Administered 2018-02-10: 2.5 mg via INTRAVENOUS
  Filled 2018-02-10: qty 1

## 2018-02-10 MED ORDER — VANCOMYCIN HCL IN DEXTROSE 1-5 GM/200ML-% IV SOLN
1000.0000 mg | Freq: Once | INTRAVENOUS | Status: AC
  Administered 2018-02-10: 1000 mg via INTRAVENOUS
  Filled 2018-02-10: qty 200

## 2018-02-10 MED ORDER — POLYETHYLENE GLYCOL 3350 17 G PO PACK: 17.0000 g | PACK | Freq: Every day | ORAL | 0 refills | Status: DC

## 2018-02-10 MED ORDER — DICYCLOMINE HCL 10 MG PO CAPS
10.0000 mg | ORAL_CAPSULE | Freq: Once | ORAL | Status: AC
  Administered 2018-02-10: 10 mg via ORAL
  Filled 2018-02-10: qty 1

## 2018-02-10 MED ORDER — SODIUM CHLORIDE 0.9 % IV BOLUS
1000.0000 mL | Freq: Once | INTRAVENOUS | Status: AC
  Administered 2018-02-10: 1000 mL via INTRAVENOUS

## 2018-02-10 NOTE — ED Notes (Signed)
Patient transported to X-ray 

## 2018-02-10 NOTE — ED Provider Notes (Signed)
[facility] Emergency Department Provider Note   ____________________________________________   First MD Initiated Contact with Patient 02/10/18 [phone]     (approximate)  I have reviewed the triage vital signs and the nursing notes.   HISTORY  Chief Complaint Abdominal Pain    HPI Jamie Bennett is a 57 y.o. female who comes into the hospital today with some ongoing abdominal pain.  The patient has been having this pain for multiple weeks.  She has pain all over her abdomen and bleeding in her belly button.  The patient reports that it feels like there is a deep bruise but has occasional sharp intermittent pain.  The patient has been having the symptoms for the last few days.  It starting to go around to her back on the left.  The patient was in the ER a month ago and was found to be constipated.  She was referred to GI and given some Carafate.  She reports though that the GI doctor started her on some antibiotics and referred her to dermatology.  She reports that she has her appointment to be seen by dermatology in 3 weeks.  She reports though that the pain became very bad.  She rates her pain a 7-8 out of 10 in intensity.  The patient has been on clindamycin ciprofloxacin and Bactrim for her  umbilical bleeding but has not been helping.  She is been taking Tylenol and it also does not help.  She is here today for evaluation again.   Past Medical History:  Diagnosis Date  . Anemia   . Anxiety   . Bipolar 1 disorder ([place])   . Collagen vascular disease ([place])    rhematoid arthritis  . Constipation   . Fibromyalgia   . Hepatic steatosis   . History of Roux-en-Y gastric bypass   . Hypotension   . Hypothyroidism   . Opioid abuse ([place])   . Osteoarthritis   . Osteoporosis   . PONV (postoperative nausea and vomiting)    nausea only  . Rheumatic fever   . Rheumatoid arthritis ([place])   . Thyroid disease     Patient Active Problem List   Diagnosis Date  Noted  . Umbilical bleeding 50/53/9767  . Tinea pedis of both feet 01/23/2018  . Atopic dermatitis 12/05/2017  . Primary osteoarthritis of both knees 01/19/2017  . Suicide attempt ([place]) 07/10/2016  . Fibromyalgia 07/10/2016  . High risk medication use 07/10/2016  . AKI (acute kidney injury) ([place]) 11/09/2015  . Elevated troponin 11/09/2015  . Hypotension 11/09/2015  . Respiratory failure ([place])   . Acute hepatic failure 11/08/2015  . Drug overdose 11/08/2015  . Fatty infiltration of liver 02/16/2015  . Hepatic fibrosis 02/16/2015  . Abnormal serum level of alkaline phosphatase 02/15/2015  . Iron deficiency anemia 02/05/2015  . Vitamin B 12 deficiency 02/05/2015  . OP (osteoporosis) 06/16/2014  . Rheumatoid arteritis 03/02/2014  . Osteoarthritis of left hip 03/02/2014  . Hypothyroidism 03/02/2014  . Rheumatic fever without heart involvement 03/02/2014  . Adult hypothyroidism 03/02/2014  . Arthritis of pelvic region, degenerative 03/02/2014  . Bipolar 1 disorder, depressed ([place]) 02/26/2014  . Bariatric surgery status 11/24/2013  . Affective bipolar disorder ([place]) 11/24/2013  . Bipolar affective disorder ([place]) 11/24/2013  . Rheumatoid arthritis ([place]) 11/24/2013  . Polysubstance (excluding opioids) dependence ([place]) 09/11/2013  . Polysubstance dependence ([place]) 09/11/2013  . Combined drug dependence excluding opioids ([place]) 09/11/2013  . Arthritis or polyarthritis, rheumatoid ([place]) 09/05/2013  . Leg  weakness 09/05/2013    Past Surgical History:  Procedure Laterality Date  . CHOLECYSTECTOMY    . COLONOSCOPY N/A 07/16/2017   Procedure: COLONOSCOPY;  Surgeon: Manya Silvas, MD;  Location: [facility] ENDOSCOPY;  Service: Endoscopy;  Laterality: N/A;  . GASTRIC BYPASS    . TONSILLECTOMY      Prior to Admission medications   Medication Sig Start Date End Date Taking? Authorizing Provider  aspirin EC 81 MG tablet Take 81 mg by mouth daily.    [provider]  busPIRone  (BUSPAR) 15 MG tablet Take 1 tablet (15 mg total) by mouth 4 (four) times daily. 11/20/17 11/20/18  Aundra Dubin, MD  Cholecalciferol (HM VITAMIN D3) 4000 units CAPS Take 4,000 Units by mouth daily.     [provider]  ciclopirox (PENLAC) 8 % solution Apply to affected nail and surrounding nail bed QHS 01/03/18   Ronnell Freshwater, NP  ciprofloxacin (CIPRO) 500 MG tablet Take 1 tablet (500 mg total) by mouth 2 (two) times daily. 01/30/18   Ronnell Freshwater, NP  clonazePAM (KLONOPIN) 0.5 MG tablet Take 1 tablet (0.5 mg total) by mouth 2 (two) times daily. Patient taking differently: Take 0.5 mg by mouth as needed.  07/10/17 07/10/18  Aundra Dubin, MD  clotrimazole-betamethasone (LOTRISONE) cream Apply 1 application topically 2 (two) times daily. 01/30/18   Ronnell Freshwater, NP  Coenzyme Q-10 200 MG CAPS Take 200 mg by mouth daily.    [provider]  cyanocobalamin (,VITAMIN B-12,) 1000 MCG/ML injection Inject 1,000 mcg into the muscle every 30 (thirty) days.    [provider]  dicyclomine (BENTYL) 10 MG capsule Take 1 capsule (10 mg total) by mouth 4 (four) times daily for 10 days. 02/10/18 02/20/18  Loney Hering, MD  docusate calcium (SURFAK) 240 MG capsule Take by mouth.    [provider]  doxycycline (VIBRA-TABS) 100 MG tablet Take 1 tablet (100 mg total) by mouth 2 (two) times daily. 02/10/18   Loney Hering, MD  esomeprazole (NEXIUM) 40 MG capsule Take 1 capsule (40 mg total) by mouth daily at 12 noon. 03/04/14   Withrow, Elyse Jarvis, FNP  Fiber, Guar Gum, CHEW Chew 10 mg by mouth daily.    [provider]  hydroxychloroquine (PLAQUENIL) 200 MG tablet Take 1 tablet by mouth twice a day Monday through Friday 12/17/17   Bo Merino, MD  hydrOXYzine (ATARAX/VISTARIL) 50 MG tablet Take 1 tablet (50 mg total) by mouth 3 (three) times daily as needed for anxiety. 10/09/17   Eksir, Richard Miu, MD  ibuprofen (ADVIL,MOTRIN) 200 MG  tablet Take 400 mg by mouth every 4 (four) hours as needed.    [provider]  Javier Docker Oil 500 MG CAPS Take 500 mg by mouth daily.    [provider]  lamoTRIgine (LAMICTAL) 150 MG tablet Take 1 tablet (150 mg total) by mouth 2 (two) times daily. 10/09/17   Eksir, Richard Miu, MD  levothyroxine (SYNTHROID, LEVOTHROID) 112 MCG tablet Take 1 tablet (112 mcg total) by mouth daily. 11/19/17   Ronnell Freshwater, NP  loratadine (CLARITIN) 10 MG tablet 10 mg  prn as needed 09/12/10   [provider]  Magnesium 400 MG CAPS Take 400 mg by mouth 2 (two) times daily.     [provider]  Multiple Vitamin (MULTIVITAMIN WITH MINERALS) TABS tablet Take 1 tablet by mouth daily.    [provider]  polyethylene glycol (MIRALAX) packet Take 17 g by  mouth daily. 02/10/18   Loney Hering, MD  sucralfate (CARAFATE) 1 g tablet Take 1 tablet (1 g total) by mouth 2 (two) times daily. 01/09/18 01/09/19  Gregor Hams, MD  tiZANidine (ZANAFLEX) 4 MG tablet Take 1 tablet (4 mg total) by mouth 3 (three) times daily as needed. 01/17/18   Ronnell Freshwater, NP  traZODone (DESYREL) 100 MG tablet Take 2 tablets (200 mg total) by mouth at bedtime. 10/09/17   Aundra Dubin, MD  venlafaxine (EFFEXOR) 100 MG tablet Take 1 tablet (100 mg total) by mouth 3 (three) times daily with meals. 10/03/17 10/03/18  Aundra Dubin, MD  zoledronic acid (RECLAST) 5 MG/100ML SOLN injection Inject 5 mg into the vein See admin instructions. Patient takes yearly    [provider]    Allergies Lactose intolerance (gi)  Family History  Problem Relation Age of Onset  . Depression Mother   . Dementia Mother   . Heart disease Mother   . Parkinson's disease Father     Social History Social History   Tobacco Use  . Smoking status: Never Smoker  . Smokeless tobacco: Never Used  Substance Use Topics  . Alcohol use: No    Alcohol/week: 0.0 oz  . Drug use: No    Review of  Systems  Constitutional: No fever/chills Eyes: No visual changes. ENT: No sore throat. Cardiovascular: Denies chest pain. Respiratory: Denies shortness of breath. Gastrointestinal: abdominal pain.  No nausea, no vomiting.  No diarrhea.  No constipation. Genitourinary: Negative for dysuria. Musculoskeletal: Negative for back pain. Skin: umbilical bleeding and redness to umbilicus. Neurological: Negative for headaches, focal weakness or numbness.   ____________________________________________   PHYSICAL EXAM:  VITAL SIGNS: ED Triage Vitals [02/09/18 2232]  Enc Vitals Group     BP 140/81     Pulse Rate 74     Resp 18     Temp 98.4 F (36.9 C)     Temp Source Oral     SpO2 98 %     Weight (!) 315 lb (142.9 kg)     Height 5\' 5"  (1.651 m)     Head Circumference      Peak Flow      Pain Score 8     Pain Loc      Pain Edu?      Excl. in [place]?     Constitutional: Alert and oriented. Well appearing and in moderate distress. Eyes: Conjunctivae are normal. PERRL. EOMI. Head: Atraumatic. Nose: No congestion/rhinnorhea. Mouth/Throat: Mucous membranes are moist.  Oropharynx non-erythematous. Cardiovascular: Normal rate, regular rhythm. Grossly normal heart sounds.  Good peripheral circulation. Respiratory: Normal respiratory effort.  No retractions. Lungs CTAB. Gastrointestinal: Soft with some mild diffuse abdominal pain to palpation. No distention. Positive bowel sounds Musculoskeletal: No lower extremity tenderness nor edema.  Neurologic:  Normal speech and language.  Skin:  Skin is warm, dry and intact. Marland Kitchen Psychiatric: Mood and affect are normal.   ____________________________________________   LABS (all labs ordered are listed, but only abnormal results are displayed)  Labs Reviewed  COMPREHENSIVE METABOLIC PANEL - Abnormal; Notable for the following components:      Result Value   CO2 21 (*)    Alkaline Phosphatase 131 (*)    All other components within normal limits    URINALYSIS, COMPLETE (UACMP) WITH MICROSCOPIC - Abnormal; Notable for the following components:   Color, Urine YELLOW (*)    APPearance CLEAR (*)    Hgb urine dipstick MODERATE (*)  All other components within normal limits  LIPASE, BLOOD  CBC   ____________________________________________  EKG  none ____________________________________________  RADIOLOGY  ED MD interpretation:  KUB: An air distended loop of bowel in the left lower abdomen may represent the sigmoid colon or a segment of small bowel  CT abdomen and pelvis: Abnormal soft tissue thickening at the umbilicus with sinus tract extending into the peritoneal cavity, no clear fistulous connection in the small bowel, associated surrounding inflammatory change.  Official radiology report(s): Dg Abdomen 1 View  Result Date: 02/10/2018 CLINICAL DATA:  57 year old female with abdominal pain. EXAM: ABDOMEN - 1 VIEW COMPARISON:  Abdominal CT dated 01/09/2018 FINDINGS: There is an air-filled loop of bowel measuring approximately 6 cm in diameter in the left lower abdomen which may represent an air distended slightly redundant sigmoid colon although a dilated small bowel loop is not excluded. There is moderate colonic stool burden. The remainder of the visualized small bowel appear unremarkable. No free air or radiopaque calculi noted. Multiple surgical clips in the upper abdomen. No acute osseous pathology. IMPRESSION: An air distended loop of bowel in the left lower abdomen may represent the sigmoid colon or a segment of small bowel. Electronically Signed   By: Anner Crete M.D.   On: 02/10/2018 04:54   Ct Abdomen Pelvis W Contrast  Result Date: 02/10/2018 CLINICAL DATA:  Abdominal pain with umbilical drainage. EXAM: CT ABDOMEN AND PELVIS WITH CONTRAST TECHNIQUE: Multidetector CT imaging of the abdomen and pelvis was performed using the standard protocol following bolus administration of intravenous contrast. CONTRAST:  148mL  ISOVUE-300 IOPAMIDOL (ISOVUE-300) INJECTION 61% COMPARISON:  CT abdomen pelvis 01/09/2018 FINDINGS: LOWER CHEST: No basilar pulmonary nodules or pleural effusion. No apical pericardial effusion. HEPATOBILIARY: Normal hepatic contours and density. No intra- or extrahepatic biliary dilatation. Normal gallbladder. PANCREAS: Normal parenchymal contours without ductal dilatation. No peripancreatic fluid collection. SPLEEN: Normal. ADRENALS/URINARY TRACT: --Adrenal glands: Normal. --Right kidney/ureter: No hydronephrosis, nephroureterolithiasis, perinephric stranding or solid renal mass. --Left kidney/ureter: No hydronephrosis, nephroureterolithiasis, perinephric stranding or solid renal mass. --Urinary bladder: Normal for degree of distention STOMACH/BOWEL: --Stomach/Duodenum: Status post gastric bypass. --Small bowel: No dilatation or inflammation. --Colon: No focal abnormality. --Appendix: Normal. VASCULAR/LYMPHATIC: Normal course and caliber of the major abdominal vessels. No abdominal or pelvic lymphadenopathy. REPRODUCTIVE: Normal uterus.  No adnexal mass. MUSCULOSKELETAL. No bony spinal canal stenosis or focal osseous abnormality. OTHER: Abnormal soft tissue thickening at the umbilicus with a sinus tract extending into the peritoneal cavity. The tract is in close proximity to loops of small bowel but there is no clear communication. Mild surrounding inflammatory change. IMPRESSION: Abnormal soft tissue thickening at the umbilicus with sinus tract extending into the peritoneal cavity. No clear fistulous connection to the small bowel. Associated surrounding inflammatory change. Electronically Signed   By: Ulyses Jarred M.D.   On: 02/10/2018 06:39    ____________________________________________   PROCEDURES  Procedure(s) performed: None  Procedures  Critical Care performed: No  ____________________________________________   INITIAL IMPRESSION / ASSESSMENT AND PLAN / ED COURSE  As part of my medical  decision making, I reviewed the following data within the electronic MEDICAL RECORD NUMBER Notes from prior ED visits and [place] Controlled Substance Database   This is a 57 year old female who comes into the hospital today with some abdominal pain.  The patient has been having this pain for multiple weeks as well as umbilical bleeding.  The patient has been on many different antibiotics in the past few weeks but still continues to have  abdominal pain.  We did order some blood work on the patient and I did give her some Haldol and a liter of normal saline.    The patient had an x-ray which showed some dilated loops of small bowel so although she had a CT scan 1 month ago I repeated her CT.  The patient does not have a small bowel obstruction but had some skin thickening and a sinus tract into the peritoneal cavity.  I will give the patient a dose of vancomycin and discharge her with some doxycycline.  The patient did still have some moderate stool burden on her x-ray.  The patient is to follow-up with dermatology as well as with her primary care physician.  The patient will be discharged home.      ____________________________________________   FINAL CLINICAL IMPRESSION(S) / ED DIAGNOSES  Final diagnoses:  Generalized abdominal pain  Omphalitis in adult     ED Discharge Orders        Ordered    doxycycline (VIBRA-TABS) 100 MG tablet  2 times daily     02/10/18 0833    dicyclomine (BENTYL) 10 MG capsule  4 times daily     02/10/18 0833    polyethylene glycol (MIRALAX) packet  Daily     02/10/18 0835       Note:  This document was prepared using Dragon voice recognition software and may include unintentional dictation errors.    Loney Hering, MD 02/10/18 [phone]

## 2018-02-10 NOTE — ED Notes (Signed)
ED Provider at bedside. 

## 2018-02-10 NOTE — Discharge Instructions (Signed)
Please follow up with your primary care physician for further evaluation of your abdominal pain and cellulitis

## 2018-02-10 NOTE — ED Notes (Signed)
Patient transported to CT 

## 2018-02-10 NOTE — ED Notes (Addendum)
Fluids flowing slowly. Arm repositioned and fluids raised to promote better flow. Still approx 200cc remain from 1000cc bolus. Will continue to monitor.

## 2018-02-10 NOTE — ED Notes (Signed)
Patient with complaint of lower abdominal pain time two months. Patient states that she has also had drainage from her umbilicus for two months. Patient with redness below her umbilicus. Patient states that she has been seen here and by GI for the same and has been placed on antibiotics times three. Patient states that she has an appointment with dermatology on 7/31. Patient states that her pain has become worse over the past week.

## 2018-02-10 NOTE — ED Notes (Signed)
Patient given a warm blanket at this time.  

## 2018-02-11 ENCOUNTER — Inpatient Hospital Stay: Payer: Medicare HMO | Attending: Hematology and Oncology

## 2018-02-11 DIAGNOSIS — Z79899 Other long term (current) drug therapy: Secondary | ICD-10-CM | POA: Diagnosis not present

## 2018-02-11 DIAGNOSIS — E538 Deficiency of other specified B group vitamins: Secondary | ICD-10-CM | POA: Diagnosis present

## 2018-02-11 MED ORDER — CYANOCOBALAMIN 1000 MCG/ML IJ SOLN
1000.0000 ug | Freq: Once | INTRAMUSCULAR | Status: AC
  Administered 2018-02-11: 1000 ug via INTRAMUSCULAR

## 2018-02-12 DIAGNOSIS — F603 Borderline personality disorder: Secondary | ICD-10-CM | POA: Diagnosis not present

## 2018-02-12 DIAGNOSIS — R69 Illness, unspecified: Secondary | ICD-10-CM | POA: Diagnosis not present

## 2018-02-12 DIAGNOSIS — F4010 Social phobia, unspecified: Secondary | ICD-10-CM | POA: Diagnosis not present

## 2018-02-15 ENCOUNTER — Ambulatory Visit (INDEPENDENT_AMBULATORY_CARE_PROVIDER_SITE_OTHER): Payer: Medicare HMO | Admitting: Nurse Practitioner

## 2018-02-15 ENCOUNTER — Encounter: Payer: Self-pay | Admitting: Nurse Practitioner

## 2018-02-15 VITALS — BP 128/84 | HR 68 | Resp 16 | Ht 65.0 in | Wt 328.2 lb

## 2018-02-15 DIAGNOSIS — R1084 Generalized abdominal pain: Secondary | ICD-10-CM

## 2018-02-15 DIAGNOSIS — L02211 Cutaneous abscess of abdominal wall: Secondary | ICD-10-CM | POA: Diagnosis not present

## 2018-02-15 DIAGNOSIS — R198 Other specified symptoms and signs involving the digestive system and abdomen: Secondary | ICD-10-CM | POA: Diagnosis not present

## 2018-02-15 MED ORDER — HYDROCODONE-ACETAMINOPHEN 5-325 MG PO TABS: 1.0000 | ORAL_TABLET | Freq: Three times a day (TID) | ORAL | 0 refills | Status: DC | PRN

## 2018-02-15 NOTE — Progress Notes (Signed)
[facility] [place], [place] [address]  Internal MEDICINE  Office Visit Note  Patient Name: Jamie Bennett  [id number]  [id number]  Date of Service: 03/10/2018  Chief Complaint  Patient presents with  . Results    review ultrasound  . other    pt complaining of pain. has been going on for about two month, around the umbilical area, and still bleeding    Patient has been having irritation and bleeding from her naval for several weeks. Is gradually becoming worse. Bleeding is now most significant after she showers. She is still not having pain or rash associated with this bleeding. She has not noted any lesion on or around the abdomen or naval. An ultrasound of the area was performed 02/01/2018 and was negative for any acute abnormalities or changes. When she continued to have tenderness and bleeding, she was seen in ER where a second CT scan was performed. Now showing soft tissue thickening and sinus tract which extends into the peritoneal caity. There is also inflammatory changes noted.       Current Medication: No facility-administered encounter medications on file as of 02/15/2018.    Outpatient Encounter Medications as of 02/15/2018  Medication Sig Note  . aspirin EC 81 MG tablet Take 81 mg by mouth daily.   . busPIRone (BUSPAR) 15 MG tablet Take 1 tablet (15 mg total) by mouth 4 (four) times daily. (Patient taking differently: Take 30 mg by mouth 2 (two) times daily. )   . Cholecalciferol (HM VITAMIN D3) 4000 units CAPS Take 4,000 Units by mouth daily.    . ciclopirox (PENLAC) 8 % solution Apply to affected nail and surrounding nail bed QHS   . clonazePAM (KLONOPIN) 0.5 MG tablet Take 1 tablet (0.5 mg total) by mouth 2 (two) times daily. (Patient taking differently: Take 0.5 mg by mouth 2 (two) times daily as needed for anxiety. )   . Coenzyme Q-10 200 MG CAPS Take 200 mg by mouth 2 (two) times daily.    . cyanocobalamin (,VITAMIN B-12,) 1000 MCG/ML  injection Inject 1,000 mcg into the muscle every 30 (thirty) days.   . Fiber, Guar Gum, CHEW Chew 5 mg by mouth daily.    . hydroxychloroquine (PLAQUENIL) 200 MG tablet Take 1 tablet by mouth twice a day Monday through Friday   . hydrOXYzine (ATARAX/VISTARIL) 50 MG tablet Take 1 tablet (50 mg total) by mouth 3 (three) times daily as needed for anxiety.   Javier Docker Oil 500 MG CAPS Take 500 mg by mouth daily.   Marland Kitchen lamoTRIgine (LAMICTAL) 150 MG tablet Take 1 tablet (150 mg total) by mouth 2 (two) times daily.   Marland Kitchen levothyroxine (SYNTHROID, LEVOTHROID) 112 MCG tablet Take 1 tablet (112 mcg total) by mouth daily. (Patient taking differently: Take 112 mcg by mouth daily before breakfast. )   . Magnesium 400 MG CAPS Take 400 mg by mouth 2 (two) times daily.    . Multiple Vitamin (MULTIVITAMIN WITH MINERALS) TABS tablet Take 1 tablet by mouth daily.   . polyethylene glycol (MIRALAX) packet Take 17 g by mouth daily. (Patient taking differently: Take 17 g by mouth daily as needed for moderate constipation. )   . sucralfate (CARAFATE) 1 g tablet Take 1 tablet (1 g total) by mouth 2 (two) times daily.   Marland Kitchen tiZANidine (ZANAFLEX) 4 MG tablet Take 1 tablet (4 mg total) by mouth 3 (three) times daily as needed. (Patient taking differently: Take 4 mg by mouth 3 (  three) times daily as needed for muscle spasms. )   . traZODone (DESYREL) 100 MG tablet Take 2 tablets (200 mg total) by mouth at bedtime. (Patient taking differently: Take 100 mg by mouth at bedtime as needed and may repeat dose one time if needed for sleep. )   . venlafaxine (EFFEXOR) 100 MG tablet Take 1 tablet (100 mg total) by mouth 3 (three) times daily with meals.   . zoledronic acid (RECLAST) 5 MG/100ML SOLN injection Inject 5 mg into the vein See admin instructions. Patient takes yearly   . [DISCONTINUED] ciprofloxacin (CIPRO) 500 MG tablet Take 1 tablet (500 mg total) by mouth 2 (two) times daily.   . [DISCONTINUED] clotrimazole-betamethasone (LOTRISONE)  cream Apply 1 application topically 2 (two) times daily.   . [DISCONTINUED] dicyclomine (BENTYL) 10 MG capsule Take 1 capsule (10 mg total) by mouth 4 (four) times daily for 10 days.   . [DISCONTINUED] docusate calcium (SURFAK) 240 MG capsule Take by mouth.   . [DISCONTINUED] doxycycline (VIBRA-TABS) 100 MG tablet Take 1 tablet (100 mg total) by mouth 2 (two) times daily.   . [DISCONTINUED] esomeprazole (NEXIUM) 40 MG capsule Take 1 capsule (40 mg total) by mouth daily at 12 noon.   . [DISCONTINUED] ibuprofen (ADVIL,MOTRIN) 200 MG tablet Take 400 mg by mouth every 4 (four) hours as needed for headache, mild pain or moderate pain.    . [DISCONTINUED] loratadine (CLARITIN) 10 MG tablet 10 mg  prn as needed 03/02/2017: Takes only as needed.   . [DISCONTINUED] HYDROcodone-acetaminophen (NORCO/VICODIN) 5-325 MG tablet Take 1 tablet by mouth 3 (three) times daily as needed for moderate pain.     Surgical History: Past Surgical History:  Procedure Laterality Date  . CHOLECYSTECTOMY  2004  . COLONOSCOPY N/A 07/16/2017   Procedure: COLONOSCOPY;  Surgeon: Manya Silvas, MD;  Location[facility] ENDOSCOPY;  Service: Endoscopy;  Laterality: N/A;  . EXPLORATORY LAPAROTOMY  2009  . GASTRIC BYPASS  2003   [place] N/A 03/01/2018   Procedure: EXPLORATORY LAPAROTOMY/ UMBILECTOMY;  Surgeon: Robert Bellow, MD;  Location: [facility] ORS;  Service: General;  Laterality: N/A;  . TONSILLECTOMY    . VENTRAL HERNIA REPAIR N/A 03/07/2018   Procedure: HERNIA REPAIR VENTRAL ADULT;  Surgeon: Robert Bellow, MD;  Location: [facility] ORS;  Service: General;  Laterality: N/A;    Medical History: Past Medical History:  Diagnosis Date  . Anemia   . Anxiety   . Bipolar 1 disorder ([place])   . Collagen vascular disease ([place])    rhematoid arthritis  . Constipation   . Fibromyalgia   . GERD (gastroesophageal reflux disease)   . Heart murmur    mild-asymptomatic  . Hepatic steatosis   . History of  Roux-en-Y gastric bypass   . Hypotension   . Hypothyroidism   . Opioid abuse ([place])   . Osteoarthritis   . Osteoporosis   . PONV (postoperative nausea and vomiting)    nausea only  . Rheumatic fever   . Rheumatoid arthritis ([place])   . Thyroid disease     Family History: Family History  Problem Relation Age of Onset  . Depression Mother   . Dementia Mother   . Heart disease Mother   . Parkinson's disease Father   . Colon cancer Neg Hx     Social History   Socioeconomic History  . Marital status: Married    Spouse name: Not on file  . Number of children: Not on file  .  Years of education: Not on file  . Highest education level: Not on file  Occupational History  . Not on file  Social Needs  . Financial resource strain: Not on file  . Food insecurity:    Worry: Not on file    Inability: Not on file  . Transportation needs:    Medical: Not on file    Non-medical: Not on file  Tobacco Use  . Smoking status: Never Smoker  . Smokeless tobacco: Never Used  Substance and Sexual Activity  . Alcohol use: No    Alcohol/week: 0.0 standard drinks  . Drug use: No  . Sexual activity: Never  Lifestyle  . Physical activity:    Days per week: Not on file    Minutes per session: Not on file  . Stress: Not on file  Relationships  . Social connections:    Talks on phone: Not on file    Gets together: Not on file    Attends religious service: Not on file    Active member of club or organization: Not on file    Attends meetings of clubs or organizations: Not on file    Relationship status: Not on file  . Intimate partner violence:    Fear of current or ex partner: Not on file    Emotionally abused: Not on file    Physically abused: Not on file    Forced sexual activity: Not on file  Other Topics Concern  . Not on file  Social History Narrative  . Not on file      Review of Systems  Constitutional: Positive for fatigue. Negative for activity change, chills and  unexpected weight change.  HENT: Negative for congestion, postnasal drip, rhinorrhea, sneezing and sore throat.   Eyes: Negative.  Negative for redness.  Respiratory: Negative for cough, chest tightness, shortness of breath and wheezing.   Cardiovascular: Negative for chest pain and palpitations.  Gastrointestinal: Positive for abdominal pain. Negative for constipation, diarrhea, nausea and vomiting.       Bleeding from umbilicus  Endocrine: Negative for cold intolerance, heat intolerance, polydipsia, polyphagia and polyuria.       Well controlled thyroid issues.   Genitourinary: Negative.  Negative for dysuria and frequency.  Musculoskeletal: Positive for arthralgias, back pain and myalgias. Negative for joint swelling and neck pain.  Skin: Positive for rash.       Has noted some blood tinged discharge from her belly button. Getting to be worse and more frequent.still has no pain or odor present.    Allergic/Immunologic: Negative for environmental allergies.  Neurological: Positive for dizziness and headaches. Negative for tremors and numbness.  Hematological: Negative for adenopathy. Does not bruise/bleed easily.  Psychiatric/Behavioral: Positive for dysphoric mood. Negative for behavioral problems (Depression), sleep disturbance and suicidal ideas. The patient is nervous/anxious.        Patient is regularly sees psychiatry.     Vital Signs: BP 128/84 (BP Location: Left Arm, Patient Position: Sitting, Cuff Size: Large)   Pulse 68   Resp 16   Ht 5\' 5"  (1.651 m)   Wt (!) 328 lb 3.2 oz (148.9 kg)   SpO2 98%   BMI 54.62 kg/m    Physical Exam  Constitutional: She is oriented to person, place, and time. She appears well-developed and well-nourished. No distress.  HENT:  Head: Normocephalic and atraumatic.  Nose: Nose normal.  Mouth/Throat: Oropharynx is clear and moist. No oropharyngeal exudate.  Eyes: Pupils are equal, round, and reactive to light. Conjunctivae  and EOM are normal.   Neck: Normal range of motion. Neck supple. No JVD present. No tracheal deviation present. No thyromegaly present.  Cardiovascular: Normal rate, regular rhythm and normal heart sounds. Exam reveals no gallop and no friction rub.  No murmur heard. Pulmonary/Chest: Effort normal and breath sounds normal. No respiratory distress. She has no wheezes. She has no rales. She exhibits no tenderness.  Abdominal: Soft. Bowel sounds are normal. There is tenderness.  Mild, generalized tenderness especially around the umbilicus  Musculoskeletal: Normal range of motion.  Lymphadenopathy:    She has no cervical adenopathy.  Neurological: She is alert and oriented to person, place, and time. She displays normal reflexes. No cranial nerve deficit.  Skin: Skin is warm and dry. Capillary refill takes less than 2 seconds. She is not diaphoretic.  There is some redness and scabbing noted in the naval. Very mild erythema present. No current bleeding or drainage is noted. However, there is dried blood around the pending of the naval.   Psychiatric: Her speech is normal and behavior is normal. Judgment and thought content normal. Cognition and memory are normal. She exhibits a depressed mood.  Nursing note and vitals reviewed.  Assessment/Plan: 1. Umbilical bleeding Reviewed results of ultrasound and recent CT with the patient and her husband. Sinus tract extending into peritoneal cavity present with inflammatory changes. Refer to surgery for further evaluation and treatment.  - Ambulatory referral to General Surgery  2. Abscess of abdominal wall Reviewed results of ultrasound and recent CT with the patient and her husband. Sinus tract extending into peritoneal cavity present with inflammatory changes. Refer to surgery for further evaluation and treatment.  - Ambulatory referral to General Surgery  3. Generalized abdominal pain Short term prescription for hydrocodone/APAP 5/325mg  tablets given today. May be taken  up to three times daily as needed for moderate to severe pain. Prescription for #15 tablets sent to her pharmacy.   General Counseling: minette manders understanding of the findings of todays visit and agrees with plan of treatment. I have discussed any further diagnostic evaluation that may be needed or ordered today. We also reviewed her medications today. she has been encouraged to call the office with any questions or concerns that should arise related to todays visit.  Reviewed risks and possible side effects associated with taking opiates and benzodiazepines. Combination of these could cause dizziness and drowsiness. Advised patient not to drive or operate machinery when taking these medications, as patient's and other's life can be at risk and will have consequences. Patient verbalized understanding in this matter.   This patient was seen by Leretha Pol FNP Collaboration with Dr Lavera Guise as a part of collaborative care agreement  Orders Placed This Encounter  Procedures  . Ambulatory referral to General Surgery    Meds ordered this encounter  Medications  . DISCONTD: HYDROcodone-acetaminophen (NORCO/VICODIN) 5-325 MG tablet    Sig: Take 1 tablet by mouth 3 (three) times daily as needed for moderate pain.    Dispense:  15 tablet    Refill:  0    Order Specific Question:   Supervising Provider    Answer:   Lavera Guise [[id number]]    Time spent: 63 Minutes      Dr Lavera Guise Internal medicine

## 2018-02-19 DIAGNOSIS — R69 Illness, unspecified: Secondary | ICD-10-CM | POA: Diagnosis not present

## 2018-02-19 DIAGNOSIS — F4010 Social phobia, unspecified: Secondary | ICD-10-CM | POA: Diagnosis not present

## 2018-02-19 DIAGNOSIS — F603 Borderline personality disorder: Secondary | ICD-10-CM | POA: Diagnosis not present

## 2018-02-21 ENCOUNTER — Encounter: Payer: Self-pay | Admitting: General Surgery

## 2018-02-21 ENCOUNTER — Ambulatory Visit (INDEPENDENT_AMBULATORY_CARE_PROVIDER_SITE_OTHER): Payer: Medicare HMO | Admitting: General Surgery

## 2018-02-21 VITALS — BP 112/70 | HR 71 | Resp 14 | Ht 65.0 in | Wt 331.0 lb

## 2018-02-21 DIAGNOSIS — Q644 Malformation of urachus: Secondary | ICD-10-CM | POA: Diagnosis not present

## 2018-02-21 NOTE — Patient Instructions (Addendum)
The patient is aware to call back for any questions or new concerns.  Schedule outpatient surgery  The patient is scheduled for surgery at [facility] on 03/01/18. She will pre admit by phone. The patient is aware of date and instructions.

## 2018-02-21 NOTE — Progress Notes (Signed)
Patient ID: Jamie Bennett, female   DOB: 1960/10/07, 57 y.o.   MRN: [id number]  Chief Complaint  Patient presents with  . Other    HPI Jamie Bennett is a 57 y.o. female.  Here today for evaluation of umbilical bleeding referred by Hassel Neth NP. She states she noticed bleeding from belly button 3 months ago. She states 2 months ago she has been having umbilical pain. The dull pain is consistent but the sharp pains come and go. She describes the discomfort as a "deep bruise". The bleeding is not much, but soils her clothing. She has been on 3 antibiotics and tried some creams as well. Denies nausea or vomiting. Bowels move daily.  Ultrasound was 02-01-18. CT scan was 02-10-18 with an ED visit. Prior CT scan was 01-09-18. She is here today with her husband, Jamie Bennett. She is currently in behavioral modification therapy to help avoid substance abuse.  HPI  Past Medical History:  Diagnosis Date  . Anemia   . Anxiety   . Bipolar 1 disorder ([place])   . Collagen vascular disease ([place])    rhematoid arthritis  . Constipation   . Fibromyalgia   . Hepatic steatosis   . History of Roux-en-Y gastric bypass   . Hypotension   . Hypothyroidism   . Opioid abuse ([place])   . Osteoarthritis   . Osteoporosis   . PONV (postoperative nausea and vomiting)    nausea only  . Rheumatic fever   . Rheumatoid arthritis ([place])   . Thyroid disease     Past Surgical History:  Procedure Laterality Date  . CHOLECYSTECTOMY  2004  . COLONOSCOPY N/A 07/16/2017   Procedure: COLONOSCOPY;  Surgeon: Manya Silvas, MD;  [facility] ENDOSCOPY;  Service: Endoscopy;  Laterality: N/A;  . EXPLORATORY LAPAROTOMY  2009  . GASTRIC BYPASS  2003   [facility]  . TONSILLECTOMY      Family History  Problem Relation Age of Onset  . Depression Mother   . Dementia Mother   . Heart disease Mother   . Parkinson's disease Father   . Colon cancer Neg Hx     Social History Social History   Tobacco Use   . Smoking status: Never Smoker  . Smokeless tobacco: Never Used  Substance Use Topics  . Alcohol use: No    Alcohol/week: 0.0 oz  . Drug use: No    Allergies  Allergen Reactions  . Lactose Intolerance (Gi) Other (See Comments)    Bloating and GI distress    Current Outpatient Medications  Medication Sig Dispense Refill  . aspirin EC 81 MG tablet Take 81 mg by mouth daily.    . busPIRone (BUSPAR) 15 MG tablet Take 1 tablet (15 mg total) by mouth 4 (four) times daily. (Patient taking differently: Take 30 mg by mouth 2 (two) times daily. ) 360 tablet 0  . Cholecalciferol (HM VITAMIN D3) 4000 units CAPS Take 4,000 Units by mouth daily.     . ciclopirox (PENLAC) 8 % solution Apply to affected nail and surrounding nail bed QHS 6.6 mL 2  . clonazePAM (KLONOPIN) 0.5 MG tablet Take 1 tablet (0.5 mg total) by mouth 2 (two) times daily. (Patient taking differently: Take 0.5 mg by mouth 2 (two) times daily as needed for anxiety. ) 60 tablet 2  . Coenzyme Q-10 200 MG CAPS Take 200 mg by mouth 2 (two) times daily.     . cyanocobalamin (,VITAMIN B-12,) 1000 MCG/ML injection Inject 1,000 mcg into the  muscle every 30 (thirty) days.    . Fiber, Guar Gum, CHEW Chew 5 mg by mouth daily.     . hydroxychloroquine (PLAQUENIL) 200 MG tablet Take 1 tablet by mouth twice a day Monday through Friday 120 tablet 0  . hydrOXYzine (ATARAX/VISTARIL) 50 MG tablet Take 1 tablet (50 mg total) by mouth 3 (three) times daily as needed for anxiety. 270 tablet 1  . ibuprofen (ADVIL,MOTRIN) 200 MG tablet Take 400 mg by mouth every 4 (four) hours as needed for headache, mild pain or moderate pain.     Javier Docker Oil 500 MG CAPS Take 500 mg by mouth daily.    Marland Kitchen lamoTRIgine (LAMICTAL) 150 MG tablet Take 1 tablet (150 mg total) by mouth 2 (two) times daily. 180 tablet 1  . levothyroxine (SYNTHROID, LEVOTHROID) 112 MCG tablet Take 1 tablet (112 mcg total) by mouth daily. 90 tablet 1  . Magnesium 400 MG CAPS Take 400 mg by mouth 2  (two) times daily.     . Multiple Vitamin (MULTIVITAMIN WITH MINERALS) TABS tablet Take 1 tablet by mouth daily.    . polyethylene glycol (MIRALAX) packet Take 17 g by mouth daily. (Patient taking differently: Take 17 g by mouth daily as needed for moderate constipation. ) 14 each 0  . sucralfate (CARAFATE) 1 g tablet Take 1 tablet (1 g total) by mouth 2 (two) times daily. 120 tablet 1  . tiZANidine (ZANAFLEX) 4 MG tablet Take 1 tablet (4 mg total) by mouth 3 (three) times daily as needed. (Patient taking differently: Take 4 mg by mouth 3 (three) times daily as needed for muscle spasms. ) 90 tablet 1  . traZODone (DESYREL) 100 MG tablet Take 2 tablets (200 mg total) by mouth at bedtime. (Patient taking differently: Take 100 mg by mouth at bedtime as needed and may repeat dose one time if needed for sleep. ) 180 tablet 1  . venlafaxine (EFFEXOR) 100 MG tablet Take 1 tablet (100 mg total) by mouth 3 (three) times daily with meals. 270 tablet 1  . zoledronic acid (RECLAST) 5 MG/100ML SOLN injection Inject 5 mg into the vein See admin instructions. Patient takes yearly    . esomeprazole (NEXIUM) 20 MG capsule Take 20 mg by mouth 2 (two) times daily before a meal.    . loratadine (CLARITIN) 10 MG tablet Take 10 mg by mouth daily as needed for allergies.    Marland Kitchen senna-docusate (COLACE 2-IN-1) 8.6-50 MG tablet Take 0.5 tablets by mouth daily.     No current facility-administered medications for this visit.     Review of Systems Review of Systems  Constitutional: Negative.   Respiratory: Negative.   Cardiovascular: Negative.   Gastrointestinal: Positive for abdominal pain. Negative for constipation and diarrhea.    Blood pressure 112/70, pulse 71, resp. rate 14, height 5\' 5"  (1.651 m), weight (!) 331 lb (150.1 kg), SpO2 97 %.  Physical Exam Physical Exam  Constitutional: She is oriented to person, place, and time. She appears well-developed and well-nourished.  HENT:  Mouth/Throat: Oropharynx is  clear and moist. No oropharyngeal exudate.  Eyes: Conjunctivae are normal. No scleral icterus.  Neck: Neck supple.  Cardiovascular: Normal rate, regular rhythm and normal heart sounds.  Pulmonary/Chest: Effort normal and breath sounds normal.  Lymphadenopathy:    She has no cervical adenopathy.  Neurological: She is alert and oriented to person, place, and time.  Skin: Skin is warm and dry.  Psychiatric: Her behavior is normal.    Data Reviewed  CT of the abdomen and pelvis of February 10, 2018 in February 08, 2018 reviewed. OTHER: Abnormal soft tissue thickening at the umbilicus with a sinus tract extending into the peritoneal cavity. The tract is in close proximity to loops of small bowel but there is no clear communication. Mild surrounding inflammatory change.  IMPRESSION: Abnormal soft tissue thickening at the umbilicus with sinus tract extending into the peritoneal cavity. No clear fistulous connection to the small bowel. Associated surrounding inflammatory change. February 09, 2018 CBC was normal.  Hemoglobin 13.3.  White blood cell count 6800.  Platelet count 277,000. Comprehensive metabolic panel the same date notable for scant elevation of the alkaline phosphatase.  Assessment    Umbilical sinus with extension into the peritoneal cavity.  Immediately adjacent to small bowel loops with the possibility of occult fistula.    Plan    The area has failed to improve after multiple course of antibiotics.  Operative exploration with the potential for management of small bowel fistula was reviewed.  Likelihood of loss of the umbilical tissue discussed.  The patient dissection will be intraperitoneal and for that reason general endotracheal anesthesia will be needed.  Probability of overnight observation reviewed.    HPI, Physical Exam, Assessment and Plan have been scribed under the direction and in the presence of Robert Bellow, MD. Karie Fetch, RN  I have completed the exam and  reviewed the above documentation for accuracy and completeness.  I agree with the above.  Haematologist has been used and any errors in dictation or transcription are unintentional.  Hervey Ard, M.D., F.A.C.S.   The patient is scheduled for surgery at [facility] on 03/01/18. She will pre admit by phone. The patient is aware of date and instructions. Documented by Caryl-Lyn Otis Brace LPN   Forest Gleason [place] Nordquist 02/22/2018, 8:49 PM

## 2018-02-22 DIAGNOSIS — Q644 Malformation of urachus: Secondary | ICD-10-CM | POA: Insufficient documentation

## 2018-02-25 ENCOUNTER — Other Ambulatory Visit: Payer: Self-pay

## 2018-02-25 ENCOUNTER — Ambulatory Visit
Admission: RE | Admit: 2018-02-25 | Discharge: 2018-02-25 | Disposition: A | Discharge status: Home / Self Care | Payer: Medicare HMO | Source: Ambulatory Visit | Attending: General Surgery | Admitting: General Surgery

## 2018-02-25 HISTORY — DX: Gastro-esophageal reflux disease without esophagitis: K21.9

## 2018-02-25 HISTORY — DX: Cardiac murmur, unspecified: R01.1

## 2018-02-25 NOTE — Patient Instructions (Signed)
Your procedure is scheduled on: 03-01-18 FRIDAY Report to Same Day Surgery 2nd floor medical mall [facility] Entrance-take elevator on left to 2nd floor.  Check in with surgery information desk.) To find out your arrival time please call [phone] between 1PM - 3PM on 02-28-18 THURSDAY  Remember: Instructions that are not followed completely may result in serious medical risk, up to and including death, or upon the discretion of your surgeon and anesthesiologist your surgery may need to be rescheduled.    _x___ 1. Do not eat food after midnight the night before your procedure. You may drink clear liquids up to 2 hours before you are scheduled to arrive at the hospital for your procedure.  Do not drink clear liquids within 2 hours of your scheduled arrival to the hospital.  Clear liquids include  --Water or Apple juice without pulp  --Clear carbohydrate beverage such as ClearFast or Gatorade  --Black Coffee or Clear Tea (No milk, no creamers, do not add anything to the coffee or Tea Type 1 and type 2 diabetics should only drink water.  No gum chewing or hard candies.     __x__ 2. No Alcohol for 24 hours before or after surgery.   __x__3. No Smoking or e-cigarettes for 24 prior to surgery.  Do not use any chewable tobacco products for at least 6 hour prior to surgery   ____  4. Bring all medications with you on the day of surgery if instructed.    __x__ 5. Notify your doctor if there is any change in your medical condition     (cold, fever, infections).    x___6. On the morning of surgery brush your teeth with toothpaste and water.  You may rinse your mouth with mouth wash if you wish.  Do not swallow any toothpaste or mouthwash.   Do not wear jewelry, make-up, hairpins, clips or nail polish.  Do not wear lotions, powders, or perfumes. You may wear deodorant.  Do not shave 48 hours prior to surgery. Men may shave face and neck.  Do not bring valuables to the hospital.    [facility]  is not responsible for any belongings or valuables.               Contacts, dentures or bridgework may not be worn into surgery.  Leave your suitcase in the car. After surgery it may be brought to your room.  For patients admitted to the hospital, discharge time is determined by your treatment team.  _  Patients discharged the day of surgery will not be allowed to drive home.  You will need someone to drive you home and stay with you the night of your procedure.    Please read over the following fact sheets that you were given:   [facility] Preparing for Surgery  _x___ TAKE THE FOLLOWING MEDICATION THE MORNING OF SURGERY WITH A SMALL SIP OF WATER. These include:  1. BUSPAR  2. Deville  3. LAMICTAL  4. LEVOTHYROXINE  5. MAGNESIUM  6. EFFEXOR  7. YOU MAY TAKE YOUR HYDROXYZINE OR KLONOPIN DAY OF SURGERY IF NEEDED  ____Fleets enema or Magnesium Citrate as directed.   ____ Use CHG Soap or sage wipes as directed on instruction sheet   ____ Use inhalers on the day of surgery and bring to hospital day of surgery  ____ Stop Metformin and Janumet 2 days prior to surgery.    ____ Take 1/2 of usual insulin dose the night before surgery and none  on the morning surgery.   _x___ Follow recommendations from Cardiologist, Pulmonologist or PCP regarding stopping Aspirin, Coumadin, Plavix ,Eliquis, Effient, or Pradaxa, and Pletal-OK TO CONTINUE 81 MG ASA-DO NOT TAKE DAY OF SURGERY  ____Stop Anti-inflammatories such as Advil, Aleve, Ibuprofen, Motrin, Naproxen, Naprosyn, Goodies powders or aspirin products.OK to take Tylenol    _x___ Stop supplements until after surgery-STOP KRILL OIL AND CO-Q 10 NOW-MAY RESUME AFTER SURGERY   ____ Bring C-Pap to the hospital.

## 2018-02-26 DIAGNOSIS — F603 Borderline personality disorder: Secondary | ICD-10-CM | POA: Diagnosis not present

## 2018-02-26 DIAGNOSIS — R69 Illness, unspecified: Secondary | ICD-10-CM | POA: Diagnosis not present

## 2018-02-26 DIAGNOSIS — F4010 Social phobia, unspecified: Secondary | ICD-10-CM | POA: Diagnosis not present

## 2018-02-28 MED ORDER — SODIUM CHLORIDE 0.9 % IV SOLN
1.0000 g | INTRAVENOUS | Status: AC
  Administered 2018-03-01: 1 g via INTRAVENOUS
  Filled 2018-02-28: qty 1

## 2018-03-01 ENCOUNTER — Encounter
Admission: RE | Disposition: A | Discharge status: Home / Self Care | Payer: Self-pay | Source: Home / Self Care | Attending: General Surgery

## 2018-03-01 ENCOUNTER — Inpatient Hospital Stay
Admission: RE | Admit: 2018-03-01 | Discharge: 2018-03-02 | DRG: 580 | Disposition: A | Discharge status: Home / Self Care | Payer: Medicare HMO | Attending: General Surgery | Admitting: General Surgery

## 2018-03-01 ENCOUNTER — Inpatient Hospital Stay: Payer: Medicare HMO | Admitting: Registered Nurse

## 2018-03-01 ENCOUNTER — Other Ambulatory Visit: Payer: Self-pay

## 2018-03-01 DIAGNOSIS — D649 Anemia, unspecified: Secondary | ICD-10-CM | POA: Diagnosis present

## 2018-03-01 DIAGNOSIS — Z6841 Body Mass Index (BMI) 40.0 and over, adult: Secondary | ICD-10-CM | POA: Diagnosis not present

## 2018-03-01 DIAGNOSIS — Z9884 Bariatric surgery status: Secondary | ICD-10-CM

## 2018-03-01 DIAGNOSIS — Z23 Encounter for immunization: Secondary | ICD-10-CM | POA: Diagnosis not present

## 2018-03-01 DIAGNOSIS — E669 Obesity, unspecified: Secondary | ICD-10-CM | POA: Diagnosis present

## 2018-03-01 DIAGNOSIS — E739 Lactose intolerance, unspecified: Secondary | ICD-10-CM | POA: Diagnosis present

## 2018-03-01 DIAGNOSIS — F319 Bipolar disorder, unspecified: Secondary | ICD-10-CM | POA: Diagnosis present

## 2018-03-01 DIAGNOSIS — Z7982 Long term (current) use of aspirin: Secondary | ICD-10-CM | POA: Diagnosis not present

## 2018-03-01 DIAGNOSIS — K219 Gastro-esophageal reflux disease without esophagitis: Secondary | ICD-10-CM | POA: Diagnosis present

## 2018-03-01 DIAGNOSIS — F419 Anxiety disorder, unspecified: Secondary | ICD-10-CM | POA: Diagnosis present

## 2018-03-01 DIAGNOSIS — Q648 Other specified congenital malformations of urinary system: Secondary | ICD-10-CM | POA: Diagnosis not present

## 2018-03-01 DIAGNOSIS — Z79899 Other long term (current) drug therapy: Secondary | ICD-10-CM | POA: Diagnosis not present

## 2018-03-01 DIAGNOSIS — M069 Rheumatoid arthritis, unspecified: Secondary | ICD-10-CM | POA: Diagnosis not present

## 2018-03-01 DIAGNOSIS — Q644 Malformation of urachus: Secondary | ICD-10-CM

## 2018-03-01 DIAGNOSIS — M2518 Fistula, other specified site: Secondary | ICD-10-CM | POA: Diagnosis not present

## 2018-03-01 DIAGNOSIS — L02216 Cutaneous abscess of umbilicus: Secondary | ICD-10-CM | POA: Diagnosis not present

## 2018-03-01 DIAGNOSIS — E039 Hypothyroidism, unspecified: Secondary | ICD-10-CM | POA: Diagnosis present

## 2018-03-01 DIAGNOSIS — L02211 Cutaneous abscess of abdominal wall: Secondary | ICD-10-CM | POA: Diagnosis present

## 2018-03-01 DIAGNOSIS — M797 Fibromyalgia: Secondary | ICD-10-CM | POA: Diagnosis not present

## 2018-03-01 DIAGNOSIS — R69 Illness, unspecified: Secondary | ICD-10-CM | POA: Diagnosis not present

## 2018-03-01 HISTORY — PX: LAPAROTOMY: SHX154

## 2018-03-01 SURGERY — LAPAROTOMY, EXPLORATORY
Anesthesia: General | Wound class: Clean

## 2018-03-01 MED ORDER — HYDROMORPHONE HCL 1 MG/ML IJ SOLN
INTRAMUSCULAR | Status: AC
  Administered 2018-03-01: 0.25 mg via INTRAVENOUS
  Filled 2018-03-01: qty 1

## 2018-03-01 MED ORDER — DEXAMETHASONE SODIUM PHOSPHATE 10 MG/ML IJ SOLN
INTRAMUSCULAR | Status: DC | PRN
  Administered 2018-03-01: 10 mg via INTRAVENOUS

## 2018-03-01 MED ORDER — LACTATED RINGERS IV SOLN
INTRAVENOUS | Status: DC
  Administered 2018-03-01: 08:00:00 via INTRAVENOUS

## 2018-03-01 MED ORDER — BUPIVACAINE LIPOSOME 1.3 % IJ SUSP
INTRAMUSCULAR | Status: DC | PRN
  Administered 2018-03-01: 50 mL

## 2018-03-01 MED ORDER — DEXAMETHASONE SODIUM PHOSPHATE 10 MG/ML IJ SOLN
INTRAMUSCULAR | Status: AC
  Filled 2018-03-01: qty 1

## 2018-03-01 MED ORDER — ONDANSETRON HCL 4 MG PO TABS: 4.0000 mg | ORAL_TABLET | ORAL | Status: DC | PRN

## 2018-03-01 MED ORDER — LORATADINE 10 MG PO TABS: 10.0000 mg | ORAL_TABLET | Freq: Every day | ORAL | Status: DC | PRN

## 2018-03-01 MED ORDER — KETOROLAC TROMETHAMINE 30 MG/ML IJ SOLN
INTRAMUSCULAR | Status: DC | PRN
  Administered 2018-03-01: 30 mg via INTRAVENOUS

## 2018-03-01 MED ORDER — ONDANSETRON HCL 4 MG/2ML IJ SOLN
INTRAMUSCULAR | Status: DC | PRN
  Administered 2018-03-01: 4 mg via INTRAVENOUS

## 2018-03-01 MED ORDER — KETOROLAC TROMETHAMINE 30 MG/ML IJ SOLN
INTRAMUSCULAR | Status: AC
  Filled 2018-03-01: qty 1

## 2018-03-01 MED ORDER — FENTANYL CITRATE (PF) 100 MCG/2ML IJ SOLN
25.0000 ug | INTRAMUSCULAR | Status: DC | PRN
  Administered 2018-03-01: 25 ug via INTRAVENOUS
  Administered 2018-03-01 (×2): 50 ug via INTRAVENOUS
  Administered 2018-03-01: 25 ug via INTRAVENOUS

## 2018-03-01 MED ORDER — OXYCODONE HCL 5 MG/5ML PO SOLN: 5.0000 mg | Freq: Once | ORAL | Status: DC | PRN

## 2018-03-01 MED ORDER — PROPOFOL 10 MG/ML IV BOLUS
INTRAVENOUS | Status: AC
  Filled 2018-03-01: qty 20

## 2018-03-01 MED ORDER — CLONAZEPAM 0.5 MG PO TABS: 0.5000 mg | ORAL_TABLET | Freq: Two times a day (BID) | ORAL | Status: DC | PRN

## 2018-03-01 MED ORDER — ACETAMINOPHEN 10 MG/ML IV SOLN
INTRAVENOUS | Status: AC
  Filled 2018-03-01: qty 100

## 2018-03-01 MED ORDER — PROMETHAZINE HCL 25 MG/ML IJ SOLN: 6.2500 mg | INTRAMUSCULAR | Status: DC | PRN

## 2018-03-01 MED ORDER — ACETAMINOPHEN 325 MG PO TABS: 650.0000 mg | ORAL_TABLET | ORAL | Status: DC | PRN

## 2018-03-01 MED ORDER — SUGAMMADEX SODIUM 500 MG/5ML IV SOLN
INTRAVENOUS | Status: AC
  Filled 2018-03-01: qty 5

## 2018-03-01 MED ORDER — FENTANYL CITRATE (PF) 100 MCG/2ML IJ SOLN
INTRAMUSCULAR | Status: AC
  Administered 2018-03-01: 50 ug via INTRAVENOUS
  Filled 2018-03-01: qty 2

## 2018-03-01 MED ORDER — VITAMIN D3 25 MCG (1000 UNIT) PO TABS
4000.0000 [IU] | ORAL_TABLET | Freq: Every day | ORAL | Status: DC
  Administered 2018-03-01: 4000 [IU] via ORAL
  Filled 2018-03-01 (×5): qty 4

## 2018-03-01 MED ORDER — MIDAZOLAM HCL 2 MG/2ML IJ SOLN
INTRAMUSCULAR | Status: AC
  Filled 2018-03-01: qty 2

## 2018-03-01 MED ORDER — ACETAMINOPHEN 10 MG/ML IV SOLN
INTRAVENOUS | Status: DC | PRN
  Administered 2018-03-01: 1000 mg via INTRAVENOUS

## 2018-03-01 MED ORDER — VENLAFAXINE HCL 37.5 MG PO TABS
100.0000 mg | ORAL_TABLET | Freq: Three times a day (TID) | ORAL | Status: DC
  Administered 2018-03-01 – 2018-03-02 (×2): 100 mg via ORAL
  Filled 2018-03-01 (×4): qty 1

## 2018-03-01 MED ORDER — LIDOCAINE HCL (PF) 2 % IJ SOLN
INTRAMUSCULAR | Status: AC
  Filled 2018-03-01: qty 10

## 2018-03-01 MED ORDER — ONDANSETRON HCL 4 MG/2ML IJ SOLN
INTRAMUSCULAR | Status: AC
  Filled 2018-03-01: qty 2

## 2018-03-01 MED ORDER — SUCRALFATE 1 G PO TABS
1.0000 g | ORAL_TABLET | Freq: Two times a day (BID) | ORAL | Status: DC
  Administered 2018-03-01 – 2018-03-02 (×2): 1 g via ORAL
  Filled 2018-03-01 (×2): qty 1

## 2018-03-01 MED ORDER — FENTANYL CITRATE (PF) 100 MCG/2ML IJ SOLN
INTRAMUSCULAR | Status: DC | PRN
  Administered 2018-03-01: 100 ug via INTRAVENOUS
  Administered 2018-03-01: 25 ug via INTRAVENOUS
  Administered 2018-03-01: 50 ug via INTRAVENOUS
  Administered 2018-03-01 (×3): 25 ug via INTRAVENOUS

## 2018-03-01 MED ORDER — EPHEDRINE SULFATE 50 MG/ML IJ SOLN
INTRAMUSCULAR | Status: AC
  Filled 2018-03-01: qty 1

## 2018-03-01 MED ORDER — ROCURONIUM BROMIDE 50 MG/5ML IV SOLN
INTRAVENOUS | Status: AC
  Filled 2018-03-01: qty 1

## 2018-03-01 MED ORDER — SUGAMMADEX SODIUM 500 MG/5ML IV SOLN
INTRAVENOUS | Status: DC | PRN
  Administered 2018-03-01: 300 mg via INTRAVENOUS

## 2018-03-01 MED ORDER — TIZANIDINE HCL 4 MG PO TABS
4.0000 mg | ORAL_TABLET | Freq: Three times a day (TID) | ORAL | Status: DC | PRN
  Filled 2018-03-01: qty 1

## 2018-03-01 MED ORDER — MAGNESIUM OXIDE 400 (241.3 MG) MG PO TABS
400.0000 mg | ORAL_TABLET | Freq: Two times a day (BID) | ORAL | Status: DC
  Administered 2018-03-01 (×2): 400 mg via ORAL
  Filled 2018-03-01 (×3): qty 1

## 2018-03-01 MED ORDER — POLYETHYLENE GLYCOL 3350 17 G PO PACK
17.0000 g | PACK | Freq: Every day | ORAL | Status: DC
  Administered 2018-03-01: 17 g via ORAL
  Filled 2018-03-01 (×2): qty 1

## 2018-03-01 MED ORDER — HYDROXYZINE HCL 25 MG PO TABS: 50.0000 mg | ORAL_TABLET | Freq: Three times a day (TID) | ORAL | Status: DC | PRN

## 2018-03-01 MED ORDER — FENTANYL CITRATE (PF) 100 MCG/2ML IJ SOLN
INTRAMUSCULAR | Status: AC
  Administered 2018-03-01: 25 ug via INTRAVENOUS
  Filled 2018-03-01: qty 2

## 2018-03-01 MED ORDER — TRAZODONE HCL 100 MG PO TABS: 100.0000 mg | ORAL_TABLET | Freq: Every evening | ORAL | Status: DC | PRN

## 2018-03-01 MED ORDER — ADULT MULTIVITAMIN W/MINERALS CH
1.0000 | ORAL_TABLET | Freq: Every day | ORAL | Status: DC
  Administered 2018-03-01: 1 via ORAL
  Filled 2018-03-01 (×2): qty 1

## 2018-03-01 MED ORDER — MORPHINE SULFATE (PF) 2 MG/ML IV SOLN
2.0000 mg | INTRAVENOUS | Status: DC | PRN
  Administered 2018-03-01 – 2018-03-02 (×7): 2 mg via INTRAVENOUS
  Filled 2018-03-01 (×7): qty 1

## 2018-03-01 MED ORDER — PNEUMOCOCCAL VAC POLYVALENT 25 MCG/0.5ML IJ INJ
0.5000 mL | INJECTION | INTRAMUSCULAR | Status: AC
  Administered 2018-03-02: 0.5 mL via INTRAMUSCULAR
  Filled 2018-03-01: qty 0.5

## 2018-03-01 MED ORDER — ZOLEDRONIC ACID 5 MG/100ML IV SOLN: 5.0000 mg | INTRAVENOUS | Status: DC

## 2018-03-01 MED ORDER — PHENYLEPHRINE HCL 10 MG/ML IJ SOLN
INTRAMUSCULAR | Status: AC
  Filled 2018-03-01: qty 1

## 2018-03-01 MED ORDER — BUPIVACAINE LIPOSOME 1.3 % IJ SUSP
INTRAMUSCULAR | Status: AC
  Filled 2018-03-01: qty 20

## 2018-03-01 MED ORDER — CICLOPIROX 8 % EX SOLN: Freq: Every day | CUTANEOUS | Status: DC

## 2018-03-01 MED ORDER — HYDROXYCHLOROQUINE SULFATE 200 MG PO TABS
200.0000 mg | ORAL_TABLET | ORAL | Status: DC
  Administered 2018-03-01: 200 mg via ORAL
  Filled 2018-03-01: qty 1

## 2018-03-01 MED ORDER — LAMOTRIGINE 25 MG PO TABS
150.0000 mg | ORAL_TABLET | Freq: Two times a day (BID) | ORAL | Status: DC
  Administered 2018-03-01: 150 mg via ORAL
  Filled 2018-03-01 (×2): qty 1

## 2018-03-01 MED ORDER — FENTANYL CITRATE (PF) 250 MCG/5ML IJ SOLN
INTRAMUSCULAR | Status: AC
Start: 2018-03-01 — End: ?
  Filled 2018-03-01: qty 5

## 2018-03-01 MED ORDER — PANTOPRAZOLE SODIUM 40 MG PO TBEC: 40.0000 mg | DELAYED_RELEASE_TABLET | Freq: Every day | ORAL | Status: DC

## 2018-03-01 MED ORDER — PROMETHAZINE HCL 25 MG/ML IJ SOLN: 6.2500 mg | Freq: Four times a day (QID) | INTRAMUSCULAR | Status: DC | PRN

## 2018-03-01 MED ORDER — EPHEDRINE SULFATE 50 MG/ML IJ SOLN
INTRAMUSCULAR | Status: DC | PRN
  Administered 2018-03-01 (×2): 10 mg via INTRAVENOUS
  Administered 2018-03-01: 5 mg via INTRAVENOUS

## 2018-03-01 MED ORDER — LACTATED RINGERS IV SOLN
INTRAVENOUS | Status: DC
  Administered 2018-03-02: 04:00:00 via INTRAVENOUS

## 2018-03-01 MED ORDER — HYDROMORPHONE HCL 1 MG/ML IJ SOLN
0.2500 mg | INTRAMUSCULAR | Status: DC | PRN
  Administered 2018-03-01 (×2): 0.25 mg via INTRAVENOUS

## 2018-03-01 MED ORDER — BUSPIRONE HCL 15 MG PO TABS
30.0000 mg | ORAL_TABLET | Freq: Two times a day (BID) | ORAL | Status: DC
  Administered 2018-03-01: 30 mg via ORAL
  Filled 2018-03-01 (×3): qty 2

## 2018-03-01 MED ORDER — HYDROCODONE-ACETAMINOPHEN 5-325 MG PO TABS
1.0000 | ORAL_TABLET | ORAL | Status: DC | PRN
  Administered 2018-03-01 (×2): 1 via ORAL
  Filled 2018-03-01 (×2): qty 1

## 2018-03-01 MED ORDER — LIDOCAINE HCL (CARDIAC) PF 100 MG/5ML IV SOSY
PREFILLED_SYRINGE | INTRAVENOUS | Status: DC | PRN
  Administered 2018-03-01: 100 mg via INTRAVENOUS

## 2018-03-01 MED ORDER — CYANOCOBALAMIN 1000 MCG/ML IJ SOLN: 1000.0000 ug | INTRAMUSCULAR | Status: DC

## 2018-03-01 MED ORDER — ASPIRIN EC 81 MG PO TBEC
81.0000 mg | DELAYED_RELEASE_TABLET | Freq: Every day | ORAL | Status: DC
  Administered 2018-03-01: 81 mg via ORAL
  Filled 2018-03-01 (×2): qty 1

## 2018-03-01 MED ORDER — ENOXAPARIN SODIUM 40 MG/0.4ML ~~LOC~~ SOLN
40.0000 mg | SUBCUTANEOUS | Status: DC
  Administered 2018-03-02: 40 mg via SUBCUTANEOUS
  Filled 2018-03-01: qty 0.4

## 2018-03-01 MED ORDER — ROCURONIUM BROMIDE 100 MG/10ML IV SOLN
INTRAVENOUS | Status: DC | PRN
  Administered 2018-03-01 (×3): 10 mg via INTRAVENOUS
  Administered 2018-03-01: 40 mg via INTRAVENOUS

## 2018-03-01 MED ORDER — MEPERIDINE HCL 50 MG/ML IJ SOLN: 6.2500 mg | INTRAMUSCULAR | Status: DC | PRN

## 2018-03-01 MED ORDER — MIDAZOLAM HCL 2 MG/2ML IJ SOLN
INTRAMUSCULAR | Status: DC | PRN
  Administered 2018-03-01: 2 mg via INTRAVENOUS

## 2018-03-01 MED ORDER — KRILL OIL 500 MG PO CAPS: 500.0000 mg | ORAL_CAPSULE | Freq: Every day | ORAL | Status: DC

## 2018-03-01 MED ORDER — LEVOTHYROXINE SODIUM 112 MCG PO TABS
112.0000 ug | ORAL_TABLET | Freq: Every day | ORAL | Status: DC
  Administered 2018-03-02: 112 ug via ORAL
  Filled 2018-03-01: qty 1

## 2018-03-01 MED ORDER — PROPOFOL 10 MG/ML IV BOLUS
INTRAVENOUS | Status: DC | PRN
  Administered 2018-03-01: 160 mg via INTRAVENOUS

## 2018-03-01 MED ORDER — BUPIVACAINE-EPINEPHRINE (PF) 0.5% -1:200000 IJ SOLN
INTRAMUSCULAR | Status: AC
  Filled 2018-03-01: qty 30

## 2018-03-01 MED ORDER — SENNOSIDES-DOCUSATE SODIUM 8.6-50 MG PO TABS
0.5000 | ORAL_TABLET | Freq: Every day | ORAL | Status: DC
  Administered 2018-03-01: 0.5 via ORAL
  Filled 2018-03-01 (×2): qty 1

## 2018-03-01 MED ORDER — IBUPROFEN 400 MG PO TABS: 400.0000 mg | ORAL_TABLET | ORAL | Status: DC | PRN

## 2018-03-01 MED ORDER — FIBER (GUAR GUM) PO CHEW: 5.0000 mg | CHEWABLE_TABLET | Freq: Every day | ORAL | Status: DC

## 2018-03-01 MED ORDER — COENZYME Q-10 200 MG PO CAPS: 200.0000 mg | ORAL_CAPSULE | Freq: Two times a day (BID) | ORAL | Status: DC

## 2018-03-01 MED ORDER — OXYCODONE HCL 5 MG PO TABS: 5.0000 mg | ORAL_TABLET | Freq: Once | ORAL | Status: DC | PRN

## 2018-03-01 MED ORDER — SUCCINYLCHOLINE CHLORIDE 20 MG/ML IJ SOLN
INTRAMUSCULAR | Status: AC
  Filled 2018-03-01: qty 1

## 2018-03-01 SURGICAL SUPPLY — 31 items
BULB RESERV EVAC DRAIN JP 100C (MISCELLANEOUS) ×2 IMPLANT
CANISTER SUCT 1200ML W/VALVE (MISCELLANEOUS) ×2 IMPLANT
CHLORAPREP W/TINT 26ML (MISCELLANEOUS) ×2 IMPLANT
COVER CLAMP SIL LG PBX B (MISCELLANEOUS) ×2 IMPLANT
DRAIN CHANNEL JP 15F RND 16 (MISCELLANEOUS) ×2 IMPLANT
DRAPE LAPAROTOMY 100X77 ABD (DRAPES) ×2 IMPLANT
DRSG TELFA 3X8 NADH (GAUZE/BANDAGES/DRESSINGS) ×2 IMPLANT
ELECT BLADE 6 FLAT ULTRCLN (ELECTRODE) ×2 IMPLANT
ELECT REM PT RETURN 9FT ADLT (ELECTROSURGICAL) ×2
ELECTRODE REM PT RTRN 9FT ADLT (ELECTROSURGICAL) ×1 IMPLANT
GAUZE SPONGE 4X4 12PLY STRL (GAUZE/BANDAGES/DRESSINGS) ×2 IMPLANT
GLOVE BIO SURGEON STRL SZ7.5 (GLOVE) ×2 IMPLANT
GLOVE INDICATOR 8.0 STRL GRN (GLOVE) ×2 IMPLANT
GOWN STRL REUS W/ TWL LRG LVL3 (GOWN DISPOSABLE) ×2 IMPLANT
GOWN STRL REUS W/TWL LRG LVL3 (GOWN DISPOSABLE) ×4
HOLDER FOLEY CATH W/STRAP (MISCELLANEOUS) ×2 IMPLANT
KIT TURNOVER KIT A (KITS) ×2 IMPLANT
LABEL OR SOLS (LABEL) ×2 IMPLANT
NS IRRIG 1000ML POUR BTL (IV SOLUTION) ×2 IMPLANT
PACK BASIN MAJOR ARMC (MISCELLANEOUS) ×2 IMPLANT
PAD DRESSING TELFA 3X8 NADH (GAUZE/BANDAGES/DRESSINGS) ×1 IMPLANT
SET YANKAUER POOLE SUCT (MISCELLANEOUS) ×2 IMPLANT
SPONGE LAP 18X18 RF (DISPOSABLE) ×2 IMPLANT
STAPLER SKIN PROX 35W (STAPLE) ×2 IMPLANT
SUT MAXON 0 T 12 3 (SUTURE) ×1 IMPLANT
SUT VIC AB 2-0 BRD 54 (SUTURE) ×2 IMPLANT
SUT VIC AB 2-0 CT1 (SUTURE) ×2 IMPLANT
SUT VIC AB 4-0 SH 27 (SUTURE) ×2
SUT VIC AB 4-0 SH 27XANBCTRL (SUTURE) IMPLANT
SUT VICRYL 3-0 SH-1 18IN (SUTURE) ×2 IMPLANT
TRAY FOLEY MTR SLVR 16FR STAT (SET/KITS/TRAYS/PACK) ×2 IMPLANT

## 2018-03-01 NOTE — Anesthesia Post-op Follow-up Note (Signed)
Anesthesia QCDR form completed.        

## 2018-03-01 NOTE — Op Note (Signed)
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  Postoperative diagnosis: Same.  Operative procedure: Umbilicectomy with excision of intra-abdominal nodule.  Operating Surgeon: Hervey Ard, MD.  Anesthesia: General endotracheal, Exparel, 20 cc; Marcaine 0.5% plain, 30 cc.  Estimated blood loss: 5 cc.  Clinical note: This 57 year old woman presents with a 79-month history of bloody, purulent drainage from the umbilicus unresponsive to oral antibiotics.  CT scan showed evidence of an inflammatory process extending from the base of the umbilicus into the medial aspect of the right rectus sheath and then into the intra-abdominal cavity.  No clear fistula connection.  She was admitted for excision of this inflammatory process.  Operative note: The patient received Invanz prior to the procedure.  SCD stockings for DVT prevention.  The skin was cleansed with ChloraPrep after the induction of general endotracheal anesthesia.  Due to her history of fibromyalgia and pain medication issues it was like to make use of Exparel and Marcaine.  This was injected as a field block in the subcutaneous tissue for postoperative analgesia.  An ellipse of skin centered above the level of the umbilicus was marked and the skin incised sharply.  The remaining dissection completed with electrocautery.  The umbilicus was excised down and at the area about 2 cm above the fascia thickened tissue without fluctuance was noted.  This was completely encompassed and the resected tissue with a resulting in removal of about a 1 cm wide band of midline fascia.  Within the abdominal cavity there was fingerlike projection that was encompassed with the tissue.  There was minimal adhesions between the omentum and this undersurface of the inflammatory mass and infraumbilical fascia which was taken down with cautery dissection.  After this mass was resected there was no evidence of residual inflammatory tissue.  This was sent in  formalin for routine histology.  A nylon suture was placed on the cephalad aspect of the skin ellipse.  No evidence of bowel involvement.  The undersurface of the fascia was cleared circumferentially.  Although an abscess cavity had not been entered, considering her history of chronic drainage it was elected not to make use of prosthetic mesh.  A primary repair of interrupted 0 Maxon figure-of-eight sutures were used to close the midline fascia.  This was done vertically.  The wound was irrigated and the 7 cm of subcutaneous fat was closed in multiple layers with interrupted 2-0 Vicryl figure-of-eight sutures.  The subcutaneous fat was approximated with simple sutures of 2-0 Vicryl.  The skin was closed with a running 4-0 Vicryl subarticular suture.  Benzoin and Steri-Strips followed by a honeycomb dressing were applied.  The patient tolerated the procedure well and was taken to recovery room in stable condition.

## 2018-03-01 NOTE — Anesthesia Postprocedure Evaluation (Signed)
Anesthesia Post Note  Patient: HARLAN VINAL  Procedure(s) Performed: EXPLORATORY LAPAROTOMY/ UMBILECTOMY (N/A )  Patient location during evaluation: PACU Anesthesia Type: General Level of consciousness: awake and alert and oriented Pain management: pain level controlled Vital Signs Assessment: post-procedure vital signs reviewed and stable Respiratory status: spontaneous breathing, nonlabored ventilation and respiratory function stable Cardiovascular status: blood pressure returned to baseline and stable Postop Assessment: no signs of nausea or vomiting Anesthetic complications: no     Last Vitals:  Vitals:   03/01/18 1213 03/01/18 1320  BP: (!) 97/56 118/72  Pulse: 73 71  Resp:    Temp: 36.8 C 36.7 C  SpO2: 99% 98%    Last Pain:  Vitals:   03/01/18 1323  TempSrc:   PainSc: 9                  Nayeliz Hipp

## 2018-03-01 NOTE — Anesthesia Preprocedure Evaluation (Signed)
Anesthesia Evaluation  Patient identified by MRN, date of birth, ID band Patient awake    Reviewed: Allergy & Precautions, NPO status , Patient's Chart, lab work & pertinent test results, reviewed documented beta blocker date and time   History of Anesthesia Complications (+) PONV and history of anesthetic complications  Airway Mallampati: II  TM Distance: >3 FB Neck ROM: Full    Dental  (+) Poor Dentition   Pulmonary neg pulmonary ROS, neg sleep apnea, neg COPD,    breath sounds clear to auscultation- rhonchi (-) wheezing      Cardiovascular Exercise Tolerance: Good (-) hypertension(-) CAD, (-) Past MI and (-) Cardiac Stents  Rhythm:Regular Rate:Normal - Systolic murmurs and - Diastolic murmurs    Neuro/Psych PSYCHIATRIC DISORDERS Anxiety Bipolar Disorder  Neuromuscular disease    GI/Hepatic Neg liver ROS, GERD  ,  Endo/Other  neg diabetesHypothyroidism   Renal/GU negative Renal ROS     Musculoskeletal  (+) Arthritis , Fibromyalgia -  Abdominal (+) + obese,   Peds  Hematology  (+) anemia ,   Anesthesia Other Findings Past Medical History: No date: Anemia No date: Anxiety No date: Bipolar 1 disorder (HCC) No date: Collagen vascular disease ([place])     Comment:  rhematoid arthritis No date: Constipation No date: Fibromyalgia No date: Hypotension No date: Hypothyroidism No date: Osteoarthritis No date: Osteoporosis No date: PONV (postoperative nausea and vomiting)     Comment:  nausea only No date: Rheumatoid arthritis (HCC) No date: Thyroid disease   Reproductive/Obstetrics                             Anesthesia Physical Anesthesia Plan  ASA: III  Anesthesia Plan: General   Post-op Pain Management:    Induction: Intravenous  PONV Risk Score and Plan: 3 and Ondansetron, Dexamethasone and Midazolam  Airway Management Planned: Oral ETT  Additional Equipment:   Intra-op  Plan:   Post-operative Plan: Extubation in OR  Informed Consent: I have reviewed the patients History and Physical, chart, labs and discussed the procedure including the risks, benefits and alternatives for the proposed anesthesia with the patient or authorized representative who has indicated his/her understanding and acceptance.   Dental advisory given  Plan Discussed with: CRNA and Anesthesiologist  Anesthesia Plan Comments:         Anesthesia Quick Evaluation

## 2018-03-01 NOTE — H&P (Signed)
No change in clinical history or exam.  Ongoing drainage and dull ache around the umbilical site.  Plan for exploration and resection of the umbilicus and suspected umbilical fistula.  Potential for small bowel involvement had previously been reviewed with the patient.

## 2018-03-01 NOTE — Anesthesia Procedure Notes (Signed)
Procedure Name: Intubation Date/Time: 03/01/2018 8:46 AM Performed by: Hedda Slade, CRNA Pre-anesthesia Checklist: Patient identified, Patient being monitored, Timeout performed, Emergency Drugs available and Suction available Patient Re-evaluated:Patient Re-evaluated prior to induction Oxygen Delivery Method: Circle system utilized Preoxygenation: Pre-oxygenation with 100% oxygen Induction Type: IV induction Ventilation: Mask ventilation without difficulty Laryngoscope Size: Mac and 3 Grade View: Grade II Tube type: Oral Tube size: 7.0 mm Number of attempts: 1 Airway Equipment and Method: Stylet Placement Confirmation: ETT inserted through vocal cords under direct vision,  positive ETCO2 and breath sounds checked- equal and bilateral Secured at: 21 cm Tube secured with: Tape Dental Injury: Teeth and Oropharynx as per pre-operative assessment

## 2018-03-01 NOTE — Transfer of Care (Signed)
Immediate Anesthesia Transfer of Care Note  Patient: Jamie Bennett  Procedure(s) Performed: EXPLORATORY LAPAROTOMY/ UMBILECTOMY (N/A )  Patient Location: PACU  Anesthesia Type:General  Level of Consciousness: sedated  Airway & Oxygen Therapy: Patient Spontanous Breathing and Patient connected to face mask oxygen  Post-op Assessment: Report given to RN and Post -op Vital signs reviewed and stable  Post vital signs: Reviewed and stable  Last Vitals:  Vitals Value Taken Time  BP 111/69 03/01/2018 10:31 AM  Temp 36 C 03/01/2018 10:31 AM  Pulse 69 03/01/2018 10:32 AM  Resp 15 03/01/2018 10:32 AM  SpO2 95 % 03/01/2018 10:32 AM  Vitals shown include unvalidated device data.  Last Pain:  Vitals:   03/01/18 1031  TempSrc: Temporal  PainSc:          Complications: No apparent anesthesia complications

## 2018-03-02 LAB — CBC
HCT: 33.6 % — ABNORMAL LOW (ref 35.0–47.0)
Hemoglobin: 11.3 g/dL — ABNORMAL LOW (ref 12.0–16.0)
MCH: 30.6 pg (ref 26.0–34.0)
MCHC: 33.6 g/dL (ref 32.0–36.0)
MCV: 91 fL (ref 80.0–100.0)
Platelets: 188 10*3/uL (ref 150–440)
RBC: 3.69 MIL/uL — ABNORMAL LOW (ref 3.80–5.20)
RDW: 13.2 % (ref 11.5–14.5)
WBC: 6.4 10*3/uL (ref 3.6–11.0)

## 2018-03-02 LAB — BASIC METABOLIC PANEL
Anion gap: 7 (ref 5–15)
BUN: 13 mg/dL (ref 6–20)
CO2: 26 mmol/L (ref 22–32)
Calcium: 8.7 mg/dL — ABNORMAL LOW (ref 8.9–10.3)
Chloride: 110 mmol/L (ref 98–111)
Creatinine, Ser: 0.96 mg/dL (ref 0.44–1.00)
GFR calc Af Amer: >60 mL/min (ref >60)
GFR calc non Af Amer: >60 mL/min (ref >60)
Glucose, Bld: 94 mg/dL (ref 70–99)
Potassium: 4.1 mmol/L (ref 3.5–5.1)
Sodium: 143 mmol/L (ref 135–145)

## 2018-03-02 MED ORDER — HYDROCODONE-ACETAMINOPHEN 5-325 MG PO TABS: 1.0000 | ORAL_TABLET | ORAL | 0 refills | Status: DC | PRN

## 2018-03-02 NOTE — Progress Notes (Signed)
Discharge order received. Patient is alert and oriented. Vital signs stable . No signs of acute distress. Discharge instructions given. Patient verbalized understanding. No other issues noted at this time.   

## 2018-03-02 NOTE — Final Progress Note (Signed)
AVSS. Tolerating diet. Ambulating well. Voiding well. Majority of pain is with change in position. Lungs: Clear. Cardio: RR. ABD: Soft. Wound: Clean and dry.  Reviewed analgesic goals. She has not been forbidden to use NSAIA, will add to multimodal regimen. Home today.    Labs auto ordered reviewed.

## 2018-03-04 LAB — SURGICAL PATHOLOGY

## 2018-03-05 ENCOUNTER — Telehealth: Payer: Self-pay | Admitting: *Deleted

## 2018-03-05 NOTE — Telephone Encounter (Signed)
Notified patient as instructed, patient agrees.  

## 2018-03-05 NOTE — Telephone Encounter (Signed)
-----   Message from Robert Bellow, MD sent at 03/04/2018  7:41 PM EDT ----- Please notify the patient the lab report was fine. No cancer. No clear cause for the problem.  ----- Message ----- From: Interface, Lab In Three Zero One Sent: 03/04/2018   1:18 PM To: Robert Bellow, MD

## 2018-03-05 NOTE — Progress Notes (Signed)
Office Visit Note  Patient: Jamie Bennett             Date of Birth: Sep 19, 1960           MRN: [id number]             PCP: Ronnell Freshwater, NP Referring: Lavera Guise, MD Visit Date: 03/19/2018 Occupation: @GUAROCC @  Subjective:  Left knee pain   History of Present Illness: Jamie Bennett is a 57 y.o. female with history of seropositive rheumatoid arthritis, osteoarthritis, DDD, and fibromyalgia.  She is on Plaquenil 200 mg BID M-F.  She has not had any recent flares of rheumatoid arthritis.  She has been having some discomfort in her right third PIP joint as well as her right great toe.  She is also having bilateral CMC joint pain.  She has been having increased discomfort in her left knee joint especially in the medial side.  She denies any joint swelling.  She denies any mechanical symptoms.  She takes Percocet for pain relief.  She continues to have muscle aches muscle tenderness due to fibromyalgia.  She has chronic fatigue and insomnia as well.  She reports that she was discharged from the hospital on Friday following hernia repair.  She states she was in the hospital for 1 week.  She states she continues to have drainage from her wound and followed up with her surgeon yesterday and will be following up with them tomorrow.  She states she continues to have a lot of abdominal wall soreness as well as fatigue.  She states that she does not have much of an appetite and has a modified diet at this time.  She states that she has been walking 2-3 times per day which she feels has been worsening her left knee pain. She is Voltaren gel at home but does not feel like she gets much pain relief when using it.  Activities of Daily Living:  Patient reports morning stiffness for 1-2 hours.   Patient Reports nocturnal pain.  Difficulty dressing/grooming: Denies Difficulty climbing stairs: Reports Difficulty getting out of chair: Denies Difficulty using hands for taps, buttons, cutlery,  and/or writing: Denies  Review of Systems  Constitutional: Negative for fatigue.  HENT: Negative for mouth sores, trouble swallowing, trouble swallowing, mouth dryness and nose dryness.   Eyes: Negative for pain, redness, visual disturbance and dryness.  Respiratory: Positive for wheezing. Negative for cough, hemoptysis, shortness of breath and difficulty breathing.   Cardiovascular: Negative for chest pain, palpitations, hypertension and swelling in legs/feet.  Gastrointestinal: Positive for abdominal pain, constipation and diarrhea. Negative for blood in stool.  Endocrine: Negative for increased urination.  Genitourinary: Negative for painful urination and pelvic pain.  Musculoskeletal: Positive for arthralgias, joint pain, joint swelling and morning stiffness. Negative for myalgias, muscle weakness, muscle tenderness and myalgias.  Skin: Negative for color change, pallor, rash, hair loss, nodules/bumps, skin tightness, ulcers and sensitivity to sunlight.  Allergic/Immunologic: Negative for susceptible to infections.  Neurological: Negative for dizziness, light-headedness, numbness, headaches, memory loss and weakness.  Hematological: Negative for swollen glands.  Psychiatric/Behavioral: Negative for depressed mood, confusion and sleep disturbance. The patient is not nervous/anxious.     PMFS History:  Patient Active Problem List   Diagnosis Date Noted  . Generalized abdominal pain 03/10/2018  . SBO (small bowel obstruction) ([place]) 03/07/2018  . Abscess of abdominal wall 03/01/2018  . Persistent umbilical sinus 41/74/0814  . Umbilical bleeding 48/18/5631  . Tinea pedis of both feet  01/23/2018  . Atopic dermatitis 12/05/2017  . Primary osteoarthritis of both knees 01/19/2017  . Suicide attempt ([place]) 07/10/2016  . Fibromyalgia 07/10/2016  . High risk medication use 07/10/2016  . AKI (acute kidney injury) ([place]) 11/09/2015  . Elevated troponin 11/09/2015  . Hypotension 11/09/2015  .  Respiratory failure ([place])   . Acute hepatic failure 11/08/2015  . Drug overdose 11/08/2015  . Fatty infiltration of liver 02/16/2015  . Hepatic fibrosis 02/16/2015  . Abnormal serum level of alkaline phosphatase 02/15/2015  . Iron deficiency anemia 02/05/2015  . Vitamin B 12 deficiency 02/05/2015  . OP (osteoporosis) 06/16/2014  . Rheumatoid arteritis 03/02/2014  . Osteoarthritis of left hip 03/02/2014  . Hypothyroidism 03/02/2014  . Rheumatic fever without heart involvement 03/02/2014  . Adult hypothyroidism 03/02/2014  . Arthritis of pelvic region, degenerative 03/02/2014  . Bipolar 1 disorder, depressed ([place]) 02/26/2014  . Bariatric surgery status 11/24/2013  . Affective bipolar disorder ([place]) 11/24/2013  . Bipolar affective disorder ([place]) 11/24/2013  . Rheumatoid arthritis ([place]) 11/24/2013  . Polysubstance (excluding opioids) dependence ([place]) 09/11/2013  . Polysubstance dependence ([place]) 09/11/2013  . Combined drug dependence excluding opioids ([place]) 09/11/2013  . Arthritis or polyarthritis, rheumatoid ([place]) 09/05/2013  . Leg weakness 09/05/2013    Past Medical History:  Diagnosis Date  . Anemia   . Anxiety   . Bipolar 1 disorder ([place])   . Collagen vascular disease ([place])    rhematoid arthritis  . Constipation   . Fibromyalgia   . GERD (gastroesophageal reflux disease)   . Heart murmur    mild-asymptomatic  . Hepatic steatosis   . History of Roux-en-Y gastric bypass   . Hypotension   . Hypothyroidism   . Opioid abuse ([place])   . Osteoarthritis   . Osteoporosis   . PONV (postoperative nausea and vomiting)    nausea only  . Rheumatic fever   . Rheumatoid arthritis ([place])   . Thyroid disease     Family History  Problem Relation Age of Onset  . Depression Mother   . Dementia Mother   . Heart disease Mother   . Parkinson's disease Father   . Colon cancer Neg Hx    Past Surgical History:  Procedure Laterality Date  . CHOLECYSTECTOMY  2004  . COLONOSCOPY N/A  07/16/2017   Procedure: COLONOSCOPY;  Surgeon: Manya Silvas, MD;  Location[facility]COPY;  Service: Endoscopy;  Laterality: N/A;  . EXPLORATORY LAPAROTOMY  2009  . GASTRIC BYPASS  2003   [place] N/A 03/01/2018   Procedure: EXPLORATORY LAPAROTOMY/ UMBILECTOMY;  Surgeon: Robert Bellow, MD;  Location: [facility] ORS;  Service: General;  Laterality: N/A;  . TONSILLECTOMY    . VENTRAL HERNIA REPAIR N/A 03/07/2018   Procedure: HERNIA REPAIR VENTRAL ADULT;  Surgeon: Robert Bellow, MD;  Location: [facility] ORS;  Service: General;  Laterality: N/A;   Social History   Social History Narrative  . Not on file    Objective: Vital Signs: BP 108/72 (BP Location: Left Arm, Patient Position: Sitting, Cuff Size: Large)   Pulse 77   Resp 16   Ht 5\' 5"  (1.651 m)   Wt (!) 334 lb (151.5 kg)   BMI 55.58 kg/m    Physical Exam  Constitutional: She is oriented to person, place, and time. She appears well-developed and well-nourished.  HENT:  Head: Normocephalic and atraumatic.  Eyes: Conjunctivae and EOM are normal.  Neck: Normal range of motion.  Cardiovascular: Normal rate, regular rhythm, normal heart sounds  and intact distal pulses.  Pulmonary/Chest: Effort normal and breath sounds normal.  Abdominal: Soft. Bowel sounds are normal.  Lymphadenopathy:    She has no cervical adenopathy.  Neurological: She is alert and oriented to person, place, and time.  Skin: Skin is warm and dry. Capillary refill takes less than 2 seconds.  Psychiatric: She has a normal mood and affect. Her behavior is normal.  Nursing note and vitals reviewed.    Musculoskeletal Exam: C-spine good range of motion with no discomfort.  Thoracic kyphosis noted.  No midline spinal tenderness.  No SI joint tenderness.  Shoulder joints, elbow joints, wrist joints, MCPs, PIPs, DIPs good range of motion with no synovitis.  She has tenderness of the right third PIP joint on exam.  She has bilateral CMC joint  tenderness.  She is complete fist formation bilaterally.  Hip joints difficult to assess while sitting.  She has full range of motion of bilateral knee joints with left knee discomfort.  No warmth or effusion of bilateral knee joints.  Tenderness along the medial joint line of the left knee. She has pedal edema bilaterally.  CDAI Exam: No CDAI exam completed.   Investigation: No additional findings.  Imaging: Ct Abdomen Pelvis Wo Contrast  Result Date: 03/11/2018 CLINICAL DATA:  New nausea and vomiting. Small bowel obstruction on x-ray. Recent ventral hernia complicated by small bowel obstruction status post repair. EXAM: CT ABDOMEN AND PELVIS WITHOUT CONTRAST TECHNIQUE: Multidetector CT imaging of the abdomen and pelvis was performed following the standard protocol without IV contrast. COMPARISON:  Abdominal x-rays from same day. CT abdomen pelvis dated March 07, 2018. FINDINGS: Lower chest: No acute abnormality. Hepatobiliary: No focal liver abnormality is seen. Status post cholecystectomy. No biliary dilatation. Pancreas: Moderately atrophic. No ductal dilatation or surrounding inflammatory changes. Spleen: Normal in size without focal abnormality. Adrenals/Urinary Tract: Adrenal glands are unremarkable. Kidneys are normal, without renal calculi, focal lesion, or hydronephrosis. Bladder is unremarkable. Stomach/Bowel: Prior Roux-en-Y gastric bypass surgery. Mild distension of the excluded stomach and duodenum. Dilatation of the Roux limb and small-bowel beyond the distal anastomosis, with transition point seen in the mid abdomen along the anterior abdominal wall at the site of recent hernia repair. Distal small bowel loops are decompressed. The colon is unremarkable. Normal appendix. Vascular/Lymphatic: No significant vascular findings are present. No enlarged abdominal or pelvic lymph nodes. Reproductive: Uterus and bilateral adnexa are unremarkable. Other: Stranding and a few foci of air within the  midline anterior abdominal wall related to recent surgery. No drainable fluid collection. Surgical drain in the anterior abdominal wall. No intra-abdominal free fluid or pneumoperitoneum. Musculoskeletal: No acute or significant osseous findings. Chronic T8 compression fracture. IMPRESSION: 1. Recurrent small bowel obstruction with transition point in the mid abdomen along the anterior abdominal wall at the site of recent hernia repair, likely due to adhesion. 2. Postsurgical changes in the anterior abdominal wall. No drainable fluid collection. Electronically Signed   By: Titus Dubin M.D.   On: 03/11/2018 11:45   Dg Abd 1 View  Result Date: 03/11/2018 CLINICAL DATA:  Initial evaluation for NG tube placement. EXAM: ABDOMEN - 1 VIEW COMPARISON:  Prior CT from 03/11/2018 FINDINGS: Enteric tube in place with tip coiled back on itself in the proximal stomach. Tip lies near the GE junction, likely within the gastric pouch, proximal to the excluded stomach within this Roux-en-Y gastric bypass patient. Visualized bowel gas pattern within normal limits. Surgical staples overlie the lower mid abdomen. IMPRESSION: Tip of enteric tube  coiled back on itself in the proximal stomach, likely within the gastric pouch in this Roux-en-Y gastric bypass patient. Electronically Signed   By: Jeannine Boga M.D.   On: 03/11/2018 15:41   Dg Abdomen 1 View  Result Date: 03/07/2018 CLINICAL DATA:  Nasogastric tube placement. EXAM: ABDOMEN - 1 VIEW COMPARISON:  CT of the abdomen and pelvis performed earlier today at 3:31 a.m. FINDINGS: The patient's enteric tube is seen ending overlying the fundus of the stomach, with the side port about the gastroesophageal junction. The visualized lung bases are grossly clear. No acute osseous abnormalities are seen. The bowel gas pattern is not well characterized on this study. IMPRESSION: Enteric tube noted ending overlying the fundus of the stomach, with the side port about the  gastroesophageal junction. Electronically Signed   By: Garald Balding M.D.   On: 03/07/2018 05:57   Ct Abdomen Pelvis W Contrast  Result Date: 03/07/2018 CLINICAL DATA:  Nausea and vomiting. Bowel obstruction. Recent umbilical hernia repair. EXAM: CT ABDOMEN AND PELVIS WITH CONTRAST TECHNIQUE: Multidetector CT imaging of the abdomen and pelvis was performed using the standard protocol following bolus administration of intravenous contrast. CONTRAST:  160mL ISOVUE-370 IOPAMIDOL (ISOVUE-370) INJECTION 76% COMPARISON:  02/10/2018 FINDINGS: LOWER CHEST: Moderate cardiomegaly. No basilar pulmonary abnormality. HEPATOBILIARY: Normal hepatic contours and density. No intra- or extrahepatic biliary dilatation. Status post cholecystectomy. PANCREAS: Normal parenchymal contours without ductal dilatation. No peripancreatic fluid collection. SPLEEN: Normal. ADRENALS/URINARY TRACT: --Adrenal glands: Normal. --Right kidney/ureter: No hydronephrosis, nephroureterolithiasis, perinephric stranding or solid renal mass. --Left kidney/ureter: No hydronephrosis, nephroureterolithiasis, perinephric stranding or solid renal mass. --Urinary bladder: Normal for degree of distention STOMACH/BOWEL: --Stomach/Duodenum: Status post gastrectomy. The duodenum is markedly dilated. --Small bowel: There is diffuse small bowel dilatation with a transition point at the site of the large ventral abdominal hernia, that contains a long segment of small bowel. --Colon: No focal abnormality. --Appendix: Normal. VASCULAR/LYMPHATIC: Normal course and caliber of the major abdominal vessels. No abdominal or pelvic lymphadenopathy. REPRODUCTIVE: Normal uterus and ovaries. MUSCULOSKELETAL. Severe right L5-S1 facet arthrosis. Normal alignment. Multilevel mild height loss of the lower thoracic spine. OTHER: Subcutaneous edema of the lower abdominal anterior soft tissues. IMPRESSION: 1. Small-bowel obstruction with transition point at the site of the large  ventral abdominal hernia that contains multiple loops of small bowel. 2. Subcutaneous edema throughout the anterior lower abdominal soft tissues, possibly indicating cellulitis. 3. Cardiomegaly. Electronically Signed   By: Ulyses Jarred M.D.   On: 03/07/2018 04:00   Dg Small Bowel  Result Date: 03/13/2018 CLINICAL DATA:  Small bowel obstruction EXAM: SMALL BOWEL SERIES COMPARISON:  Abdominal radiographs March 12, 2018; CT abdomen and pelvis March 11, 2018 TECHNIQUE: Following ingestion of thin barium, serial small bowel images were obtained including spot views of the terminal ileum. FLUOROSCOPY TIME:  Fluoroscopy Time:  0 minutes 36 seconds Number of Acquired Spot Images: 4 FINDINGS: Preliminary abdomen radiograph shows nasogastric tube tip and side-port in the stomach. There are loops of mildly dilated small bowel, less than 1 day prior. Skin staples remain in the lower abdomen slightly to the left of midline. There is a drain in the lower abdomen. Contrast reaches the colon after slightly greater than 4 hours. There is a mild transition zone located near the jejunal ileal junction, consistent with mild residual obstruction. There is no demonstrable fixation, angulation, tethering, or mucosal lesion. No extrinsic mass. Terminal ileum appears normal. No fistula or ulceration. IMPRESSION: Findings felt to be indicative of resolving bowel obstruction. Mild  transition zone remains near the jejunal ileal junction. No intrinsic small bowel abnormality evident. Terminal ileum appears normal. Electronically Signed   By: Lowella Grip III M.D.   On: 03/13/2018 15:08   Dg Abd 2 Views  Result Date: 03/12/2018 CLINICAL DATA:  Small bowel obstruction EXAM: ABDOMEN - 2 VIEW COMPARISON:  03/11/2018 FINDINGS: NG tube has been advanced into the fundus of the stomach. Dilated small bowel loops again noted in the abdomen and pelvis. Gas within nondistended large bowel. Findings most compatible with small bowel  obstruction. IMPRESSION: NG tube advanced into the fundus of the stomach. Small bowel dilatation concerning for small bowel obstruction. Electronically Signed   By: Rolm Baptise M.D.   On: 03/12/2018 08:02   Dg Abd 2 Views  Result Date: 03/11/2018 CLINICAL DATA:  Abdominal pain. Nonbloody emesis. Paraumbilical pain following hernia surgery. EXAM: ABDOMEN - 2 VIEW COMPARISON:  None. FINDINGS: Interval removal of nasogastric tube. On upright exam there is no evidence free air beneath hemidiaphragms. There are dilated loops of small bowel centrally which measure up to 6 cm which is increased from CT 03/07/2018 (4 cm.). No gas in the rectum. IMPRESSION: 1. Small bowel obstructive pattern. 2. NG tube removed. 3. No plain film evidence intraperitoneal free air. These results will be called to the ordering clinician or representative by the Radiologist Assistant, and communication documented in the PACS or zVision Dashboard. Electronically Signed   By: Suzy Bouchard M.D.   On: 03/11/2018 10:07   Dg Abd Portable 1v  Result Date: 03/11/2018 CLINICAL DATA:  NG tube EXAM: PORTABLE ABDOMEN - 1 VIEW COMPARISON:  03/11/2018, 02/2018 FINDINGS: Tip of the esophageal tube is poorly visible, suspect that it overlies the distal esophagus. Surgical changes in the epigastric region. Multiple dilated loops of bowel consistent with a bowel obstruction. IMPRESSION: Poorly visible esophageal tube tip, suspect that the tip overlies the distal esophagus. Electronically Signed   By: Donavan Foil M.D.   On: 03/11/2018 17:41    Recent Labs: Lab Results  Component Value Date   WBC 5.8 03/12/2018   HGB 11.3 (L) 03/12/2018   PLT 316 03/12/2018   NA 145 03/12/2018   K 3.8 03/12/2018   CL 110 03/12/2018   CO2 28 03/12/2018   GLUCOSE 102 (H) 03/12/2018   BUN 6 03/12/2018   CREATININE 0.90 03/12/2018   BILITOT 0.7 03/07/2018   ALKPHOS 218 (H) 03/07/2018   AST 31 03/07/2018   ALT 65 (H) 03/07/2018   PROT 7.9 03/07/2018    ALBUMIN 3.9 03/07/2018   CALCIUM 8.4 (L) 03/12/2018   GFRAA >60 03/12/2018    Speciality Comments: No specialty comments available.  Procedures:  No procedures performed Allergies: Lactose intolerance (gi)   Assessment / Plan:     Visit Diagnoses: Rheumatoid arthritis involving multiple sites with positive rheumatoid factor ([place]): She has no synovitis on exam.  She has tenderness of the right third PIP joint with no synovitis noted.  She has not had any recent rheumatoid arthritis flares.  She is clinically doing well on Plaquenil 200 mg twice daily Monday through Friday.  She will continue on his current treatment regimen.  She is a Plaquenil eye exam scheduled in September 2019.  She does not need any refills at this time.  She was advised to notify us if she develops increased joint pain or joint swelling.  She will follow-up in the office in 5 months.  High risk medication use - PLQ 200 mg BID M-Feye exam  scheduled for Sept 2019.  BMP and CBC were performed on 03/02/2018.  Renal function was within normal limits.  Primary osteoarthritis of both knees: No warmth or effusion.  She is good range of motion.  She has discomfort in her left knee joint.  She has tenderness along the medial joint line.  She requested a cortisone injection today in the office but we declined due to her recent hernia repair and bowel obstruction.  She was encouraged to use Voltaren gel as well as icing and elevating her left knee joint.  Fibromyalgia: She has been having worsening fatigue is unsure if it is related to being in the hospital recently or due to fibromyalgia.  She continues to have chronic insomnia.  She has generalized muscle aches muscle tenderness due to fibromyalgia.  She has been taking Percocet for pain relief.  She has been walking 2-3 times per day and has been trying to be more active.  Trapezius muscle spasm: She continues to have trapezius muscle tension and muscle tenderness.  DDD (degenerative  disc disease), cervical: She is good range of motion with no discomfort at this time.  DDD (degenerative disc disease), thoracic: Thoracic kyphosis noted.  No midline spinal tenderness.  She had a CT abdominal pelvis performed on 03/11/2018 that revealed chronic T8 compression fracture.  DDD (degenerative disc disease), lumbar: No midline spinal tenderness.  She has no lower back pain at this time.  Other osteoporosis without current pathological fracture: She receives Reclast IV infusions.  Other medical conditions are listed as follows:  History of bipolar disorder  History of suicide attempt  History of gastroesophageal reflux (GERD)  History of anemia  History of hypothyroidism   Orders: No orders of the defined types were placed in this encounter.  No orders of the defined types were placed in this encounter.    Follow-Up Instructions: Return in about 5 months (around 08/19/2018) for Rheumatoid arthritis, Osteoarthritis, DDD, Fibromyalgia.   Ofilia Neas, PA-C  Note - This record has been created using Dragon software.  Chart creation errors have been sought, but may not always  have been located. Such creation errors do not reflect on  the standard of medical care.

## 2018-03-06 ENCOUNTER — Ambulatory Visit (INDEPENDENT_AMBULATORY_CARE_PROVIDER_SITE_OTHER): Payer: Medicare HMO | Admitting: General Surgery

## 2018-03-06 ENCOUNTER — Encounter: Payer: Self-pay | Admitting: General Surgery

## 2018-03-06 VITALS — BP 136/68 | HR 66 | Temp 96.6°F | Resp 20 | Ht 65.0 in | Wt 334.0 lb

## 2018-03-06 DIAGNOSIS — Q644 Malformation of urachus: Secondary | ICD-10-CM

## 2018-03-06 MED ORDER — HYDROCODONE-ACETAMINOPHEN 5-325 MG PO TABS: 1.0000 | ORAL_TABLET | ORAL | 0 refills | Status: DC | PRN

## 2018-03-06 MED ORDER — PROMETHAZINE HCL 25 MG PO TABS: 25.0000 mg | ORAL_TABLET | Freq: Four times a day (QID) | ORAL | 0 refills | Status: DC | PRN

## 2018-03-06 NOTE — Progress Notes (Signed)
Patient ID: Jamie Bennett, female   DOB: [birth year], 57 y.o.   MRN: [id number]  Chief Complaint  Patient presents with  . Wound Check    HPI Jamie Bennett is a 57 y.o. female. Here for postoperative visit, umbilectomy on 03-01-18. She states she was doing good up until last night 10 pm went outside for a walk. After the walk (1 block) she began having shooting pains above incision and laterally radiating to her back up to bottom of shoulder blades. She states she was tender and in some pain before but this " is different". Last Bm this morning, soft formed brown stool. Appetite was good until last night, nausea, but no vomiting. Lightheaded when standing for any length of time. Feels bloated and distended. No fever or chills.  HPI  Past Medical History:  Diagnosis Date  . Anemia   . Anxiety   . Bipolar 1 disorder ([place])   . Collagen vascular disease ([place])    rhematoid arthritis  . Constipation   . Fibromyalgia   . GERD (gastroesophageal reflux disease)   . Heart murmur    mild-asymptomatic  . Hepatic steatosis   . History of Roux-en-Y gastric bypass   . Hypotension   . Hypothyroidism   . Opioid abuse ([place])   . Osteoarthritis   . Osteoporosis   . PONV (postoperative nausea and vomiting)    nausea only  . Rheumatic fever   . Rheumatoid arthritis ([place])   . Thyroid disease     Past Surgical History:  Procedure Laterality Date  . CHOLECYSTECTOMY  2004  . COLONOSCOPY N/A 07/16/2017   Procedure: COLONOSCOPY;  Surgeon: Manya Silvas, MD;  Location: [facility] ENDOSCOPY;  Service: Endoscopy;  Laterality: N/A;  . EXPLORATORY LAPAROTOMY  2009  . GASTRIC BYPASS  2003   [place] N/A 03/01/2018   Procedure: EXPLORATORY LAPAROTOMY/ UMBILECTOMY;  Surgeon: Robert Bellow, MD;  Location: [facility] ORS;  Service: General;  Laterality: N/A;  . TONSILLECTOMY      Family History  Problem Relation Age of Onset  . Depression Mother   . Dementia Mother   . Heart  disease Mother   . Parkinson's disease Father   . Colon cancer Neg Hx     Social History Social History   Tobacco Use  . Smoking status: Never Smoker  . Smokeless tobacco: Never Used  Substance Use Topics  . Alcohol use: No    Alcohol/week: 0.0 oz  . Drug use: No    Allergies  Allergen Reactions  . Lactose Intolerance (Gi) Other (See Comments)    Bloating and GI distress    Current Outpatient Medications  Medication Sig Dispense Refill  . aspirin EC 81 MG tablet Take 81 mg by mouth daily.    . busPIRone (BUSPAR) 15 MG tablet Take 1 tablet (15 mg total) by mouth 4 (four) times daily. (Patient taking differently: Take 30 mg by mouth 2 (two) times daily. ) 360 tablet 0  . Cholecalciferol (HM VITAMIN D3) 4000 units CAPS Take 4,000 Units by mouth daily.     . ciclopirox (PENLAC) 8 % solution Apply to affected nail and surrounding nail bed QHS 6.6 mL 2  . clonazePAM (KLONOPIN) 0.5 MG tablet Take 1 tablet (0.5 mg total) by mouth 2 (two) times daily. (Patient taking differently: Take 0.5 mg by mouth 2 (two) times daily as needed for anxiety. ) 60 tablet 2  . Coenzyme Q-10 200 MG CAPS Take 200 mg by mouth  2 (two) times daily.     . cyanocobalamin (,VITAMIN B-12,) 1000 MCG/ML injection Inject 1,000 mcg into the muscle every 30 (thirty) days.    Marland Kitchen esomeprazole (NEXIUM) 20 MG capsule Take 20 mg by mouth 2 (two) times daily before a meal.    . Fiber, Guar Gum, CHEW Chew 5 mg by mouth daily.     Marland Kitchen HYDROcodone-acetaminophen (NORCO) 5-325 MG tablet Take 1-2 tablets by mouth every 4 (four) hours as needed. 30 tablet 0  . hydroxychloroquine (PLAQUENIL) 200 MG tablet Take 1 tablet by mouth twice a day Monday through Friday 120 tablet 0  . hydrOXYzine (ATARAX/VISTARIL) 50 MG tablet Take 1 tablet (50 mg total) by mouth 3 (three) times daily as needed for anxiety. 270 tablet 1  . Krill Oil 500 MG CAPS Take 500 mg by mouth daily.    Marland Kitchen lamoTRIgine (LAMICTAL) 150 MG tablet Take 1 tablet (150 mg total)  by mouth 2 (two) times daily. 180 tablet 1  . levothyroxine (SYNTHROID, LEVOTHROID) 112 MCG tablet Take 1 tablet (112 mcg total) by mouth daily. (Patient taking differently: Take 112 mcg by mouth daily before breakfast. ) 90 tablet 1  . loratadine (CLARITIN) 10 MG tablet Take 10 mg by mouth daily as needed for allergies.    . Magnesium 400 MG CAPS Take 400 mg by mouth 2 (two) times daily.     . Multiple Vitamin (MULTIVITAMIN WITH MINERALS) TABS tablet Take 1 tablet by mouth daily.    . polyethylene glycol (MIRALAX) packet Take 17 g by mouth daily. (Patient taking differently: Take 17 g by mouth daily as needed for moderate constipation. ) 14 each 0  . senna-docusate (COLACE 2-IN-1) 8.6-50 MG tablet Take 0.5 tablets by mouth daily.    . sucralfate (CARAFATE) 1 g tablet Take 1 tablet (1 g total) by mouth 2 (two) times daily. 120 tablet 1  . tiZANidine (ZANAFLEX) 4 MG tablet Take 1 tablet (4 mg total) by mouth 3 (three) times daily as needed. (Patient taking differently: Take 4 mg by mouth 3 (three) times daily as needed for muscle spasms. ) 90 tablet 1  . traZODone (DESYREL) 100 MG tablet Take 2 tablets (200 mg total) by mouth at bedtime. (Patient taking differently: Take 100 mg by mouth at bedtime as needed and may repeat dose one time if needed for sleep. ) 180 tablet 1  . venlafaxine (EFFEXOR) 100 MG tablet Take 1 tablet (100 mg total) by mouth 3 (three) times daily with meals. 270 tablet 1  . zoledronic acid (RECLAST) 5 MG/100ML SOLN injection Inject 5 mg into the vein See admin instructions. Patient takes yearly    . HYDROcodone-acetaminophen (NORCO) 5-325 MG tablet Take 1 tablet by mouth every 4 (four) hours as needed for moderate pain. 30 tablet 0  . promethazine (PHENERGAN) 25 MG tablet Take 1 tablet (25 mg total) by mouth every 6 (six) hours as needed for nausea or vomiting. 12 tablet 0   No current facility-administered medications for this visit.     Review of Systems Review of Systems   Constitutional: Negative.   Respiratory: Negative.   Cardiovascular: Negative.   Gastrointestinal: Positive for abdominal pain and nausea. Negative for vomiting.    Blood pressure 136/68, pulse 66, temperature (!) 96.6 F (35.9 C), temperature source Oral, resp. rate 20, height 5\' 5"  (1.651 m), weight (!) 334 lb (151.5 kg), SpO2 98 %.  Physical Exam Physical Exam  Constitutional: She is oriented to person, place, and time. She  appears well-developed and well-nourished.  Cardiovascular: Normal rate, regular rhythm and normal heart sounds.  Pulmonary/Chest: Effort normal and breath sounds normal.  Abdominal: Soft. Bowel sounds are normal. There is tenderness (along the wound with mild thickening. No erythema. ).  Neurological: She is alert and oriented to person, place, and time.  Skin: Skin is warm and dry.  Psychiatric: Her behavior is normal.    Data Reviewed DIAGNOSIS:  A. UMBILICAL SINUS WITH ABSCESS; EXCISION:  - FIBROADIPOSE TISSUE WITH SUTURE MATERIAL, ASSOCIATED FOREIGN BODY  GIANT CELL REACTION, AND FISTULA FORMATION.  - OVERLYING SKIN WITH CHRONIC INFLAMMATION AND FIBROSIS.  - NEGATIVE FOR MALIGNANCY.   Assessment    Unexplained abdominal pain post resection of the umbilicus without evidence of bowel involvement.    Plan    The patient is aware to use a heating pad as needed for comfort.  Refill on Norco provided.  Phenergan prescription submitted. Call tomorrow with update.     HPI, Physical Exam, Assessment and Plan have been scribed under the direction and in the presence of Robert Bellow, MD. Karie Fetch, RN  I have completed the exam and reviewed the above documentation for accuracy and completeness.  I agree with the above.  Haematologist has been used and any errors in dictation or transcription are unintentional.  Hervey Ard, M.D., F.A.C.S.  Forest Gleason Alyssa Rotondo 03/06/2018, 7:12 PM

## 2018-03-06 NOTE — Patient Instructions (Addendum)
The patient is aware to call back for any questions or new concerns. The patient is aware to use a heating pad as needed for comfort.  Call tomorrow with update.

## 2018-03-06 NOTE — Progress Notes (Deleted)
Patient ID: Jamie Bennett, female   DOB: February 18, 1961, 57 y.o.   MRN: [id number]   Here for wound check. She states she was doing good up until last night 10 pm wen outside for a walk. After the walk (1 block) she began having shooting pains above incision and laterally radiating to her back up to shoulder blades. She states she was tender and in some pain before but this " is different". Last Bm this morning, soft formed brown stool. Appetite was good until last night, nausea, but no vomiting. Lightheaded when stand for a length of time. Feels bloated and distended. No fever or chills.

## 2018-03-07 ENCOUNTER — Other Ambulatory Visit: Payer: Self-pay

## 2018-03-07 ENCOUNTER — Encounter
Admission: EM | Disposition: A | Discharge status: Home / Self Care | Payer: Self-pay | Source: Home / Self Care | Attending: Surgery

## 2018-03-07 ENCOUNTER — Inpatient Hospital Stay
Admission: EM | Admit: 2018-03-07 | Discharge: 2018-03-15 | DRG: 354 | Disposition: A | Discharge status: Home / Self Care | Payer: Medicare HMO | Attending: General Surgery | Admitting: General Surgery

## 2018-03-07 ENCOUNTER — Inpatient Hospital Stay: Payer: Medicare HMO

## 2018-03-07 ENCOUNTER — Emergency Department: Payer: Medicare HMO

## 2018-03-07 ENCOUNTER — Inpatient Hospital Stay: Payer: Medicare HMO | Admitting: Anesthesiology

## 2018-03-07 DIAGNOSIS — Z79890 Hormone replacement therapy (postmenopausal): Secondary | ICD-10-CM | POA: Diagnosis not present

## 2018-03-07 DIAGNOSIS — Z8249 Family history of ischemic heart disease and other diseases of the circulatory system: Secondary | ICD-10-CM

## 2018-03-07 DIAGNOSIS — M069 Rheumatoid arthritis, unspecified: Secondary | ICD-10-CM | POA: Diagnosis present

## 2018-03-07 DIAGNOSIS — Y838 Other surgical procedures as the cause of abnormal reaction of the patient, or of later complication, without mention of misadventure at the time of the procedure: Secondary | ICD-10-CM | POA: Diagnosis present

## 2018-03-07 DIAGNOSIS — Z6841 Body Mass Index (BMI) 40.0 and over, adult: Secondary | ICD-10-CM | POA: Diagnosis not present

## 2018-03-07 DIAGNOSIS — E876 Hypokalemia: Secondary | ICD-10-CM | POA: Diagnosis not present

## 2018-03-07 DIAGNOSIS — R112 Nausea with vomiting, unspecified: Secondary | ICD-10-CM

## 2018-03-07 DIAGNOSIS — F319 Bipolar disorder, unspecified: Secondary | ICD-10-CM | POA: Diagnosis present

## 2018-03-07 DIAGNOSIS — Z9884 Bariatric surgery status: Secondary | ICD-10-CM

## 2018-03-07 DIAGNOSIS — Z7982 Long term (current) use of aspirin: Secondary | ICD-10-CM | POA: Diagnosis not present

## 2018-03-07 DIAGNOSIS — K219 Gastro-esophageal reflux disease without esophagitis: Secondary | ICD-10-CM | POA: Diagnosis present

## 2018-03-07 DIAGNOSIS — K9131 Postprocedural partial intestinal obstruction: Principal | ICD-10-CM | POA: Diagnosis present

## 2018-03-07 DIAGNOSIS — Z0189 Encounter for other specified special examinations: Secondary | ICD-10-CM

## 2018-03-07 DIAGNOSIS — M170 Bilateral primary osteoarthritis of knee: Secondary | ICD-10-CM | POA: Diagnosis present

## 2018-03-07 DIAGNOSIS — Z4659 Encounter for fitting and adjustment of other gastrointestinal appliance and device: Secondary | ICD-10-CM

## 2018-03-07 DIAGNOSIS — E538 Deficiency of other specified B group vitamins: Secondary | ICD-10-CM | POA: Diagnosis present

## 2018-03-07 DIAGNOSIS — Z79891 Long term (current) use of opiate analgesic: Secondary | ICD-10-CM

## 2018-03-07 DIAGNOSIS — D509 Iron deficiency anemia, unspecified: Secondary | ICD-10-CM | POA: Diagnosis present

## 2018-03-07 DIAGNOSIS — K432 Incisional hernia without obstruction or gangrene: Secondary | ICD-10-CM | POA: Diagnosis not present

## 2018-03-07 DIAGNOSIS — Z820 Family history of epilepsy and other diseases of the nervous system: Secondary | ICD-10-CM | POA: Diagnosis not present

## 2018-03-07 DIAGNOSIS — E739 Lactose intolerance, unspecified: Secondary | ICD-10-CM | POA: Diagnosis present

## 2018-03-07 DIAGNOSIS — K56699 Other intestinal obstruction unspecified as to partial versus complete obstruction: Secondary | ICD-10-CM | POA: Diagnosis not present

## 2018-03-07 DIAGNOSIS — Z9049 Acquired absence of other specified parts of digestive tract: Secondary | ICD-10-CM

## 2018-03-07 DIAGNOSIS — R1111 Vomiting without nausea: Secondary | ICD-10-CM | POA: Diagnosis not present

## 2018-03-07 DIAGNOSIS — M810 Age-related osteoporosis without current pathological fracture: Secondary | ICD-10-CM | POA: Diagnosis present

## 2018-03-07 DIAGNOSIS — M797 Fibromyalgia: Secondary | ICD-10-CM | POA: Diagnosis present

## 2018-03-07 DIAGNOSIS — Z79899 Other long term (current) drug therapy: Secondary | ICD-10-CM

## 2018-03-07 DIAGNOSIS — Z9889 Other specified postprocedural states: Secondary | ICD-10-CM

## 2018-03-07 DIAGNOSIS — E039 Hypothyroidism, unspecified: Secondary | ICD-10-CM | POA: Diagnosis not present

## 2018-03-07 DIAGNOSIS — K439 Ventral hernia without obstruction or gangrene: Secondary | ICD-10-CM | POA: Diagnosis not present

## 2018-03-07 DIAGNOSIS — Z915 Personal history of self-harm: Secondary | ICD-10-CM

## 2018-03-07 DIAGNOSIS — R69 Illness, unspecified: Secondary | ICD-10-CM | POA: Diagnosis not present

## 2018-03-07 DIAGNOSIS — K5909 Other constipation: Secondary | ICD-10-CM | POA: Diagnosis present

## 2018-03-07 DIAGNOSIS — K760 Fatty (change of) liver, not elsewhere classified: Secondary | ICD-10-CM | POA: Diagnosis present

## 2018-03-07 DIAGNOSIS — Z818 Family history of other mental and behavioral disorders: Secondary | ICD-10-CM | POA: Diagnosis not present

## 2018-03-07 DIAGNOSIS — R011 Cardiac murmur, unspecified: Secondary | ICD-10-CM | POA: Diagnosis present

## 2018-03-07 DIAGNOSIS — R1033 Periumbilical pain: Secondary | ICD-10-CM | POA: Diagnosis not present

## 2018-03-07 DIAGNOSIS — K436 Other and unspecified ventral hernia with obstruction, without gangrene: Secondary | ICD-10-CM | POA: Diagnosis not present

## 2018-03-07 DIAGNOSIS — Z978 Presence of other specified devices: Secondary | ICD-10-CM

## 2018-03-07 DIAGNOSIS — K56609 Unspecified intestinal obstruction, unspecified as to partial versus complete obstruction: Secondary | ICD-10-CM | POA: Diagnosis present

## 2018-03-07 DIAGNOSIS — K430 Incisional hernia with obstruction, without gangrene: Secondary | ICD-10-CM | POA: Diagnosis not present

## 2018-03-07 DIAGNOSIS — Z4682 Encounter for fitting and adjustment of non-vascular catheter: Secondary | ICD-10-CM | POA: Diagnosis not present

## 2018-03-07 DIAGNOSIS — R1110 Vomiting, unspecified: Secondary | ICD-10-CM | POA: Diagnosis not present

## 2018-03-07 DIAGNOSIS — F411 Generalized anxiety disorder: Secondary | ICD-10-CM | POA: Diagnosis present

## 2018-03-07 HISTORY — PX: VENTRAL HERNIA REPAIR: SHX424

## 2018-03-07 LAB — COMPREHENSIVE METABOLIC PANEL
ALT: 65 U/L — ABNORMAL HIGH (ref 0–44)
AST: 31 U/L (ref 15–41)
Albumin: 3.9 g/dL (ref 3.5–5.0)
Alkaline Phosphatase: 218 U/L — ABNORMAL HIGH (ref 38–126)
Anion gap: 13 (ref 5–15)
BUN: 12 mg/dL (ref 6–20)
CO2: 24 mmol/L (ref 22–32)
Calcium: 9.9 mg/dL (ref 8.9–10.3)
Chloride: 104 mmol/L (ref 98–111)
Creatinine, Ser: 0.83 mg/dL (ref 0.44–1.00)
GFR calc Af Amer: >60 mL/min (ref >60)
GFR calc non Af Amer: >60 mL/min (ref >60)
Glucose, Bld: 141 mg/dL — ABNORMAL HIGH (ref 70–99)
Potassium: 4.1 mmol/L (ref 3.5–5.1)
Sodium: 141 mmol/L (ref 135–145)
Total Bilirubin: 0.7 mg/dL (ref 0.3–1.2)
Total Protein: 7.9 g/dL (ref 6.5–8.1)

## 2018-03-07 LAB — CBC WITH DIFFERENTIAL/PLATELET
Basophils Absolute: 0 10*3/uL (ref 0–0.1)
Basophils Relative: 0 %
Eosinophils Absolute: 0.1 10*3/uL (ref 0–0.7)
Eosinophils Relative: 1 %
HCT: 40.2 % (ref 35.0–47.0)
Hemoglobin: 13.7 g/dL (ref 12.0–16.0)
Lymphocytes Relative: 10 %
Lymphs Abs: 0.8 10*3/uL — ABNORMAL LOW (ref 1.0–3.6)
MCH: 30.5 pg (ref 26.0–34.0)
MCHC: 34 g/dL (ref 32.0–36.0)
MCV: 89.7 fL (ref 80.0–100.0)
Monocytes Absolute: 0.3 10*3/uL (ref 0.2–0.9)
Monocytes Relative: 5 %
Neutro Abs: 6.3 10*3/uL (ref 1.4–6.5)
Neutrophils Relative %: 84 %
Platelets: 282 10*3/uL (ref 150–440)
RBC: 4.48 MIL/uL (ref 3.80–5.20)
RDW: 13 % (ref 11.5–14.5)
WBC: 7.5 10*3/uL (ref 3.6–11.0)

## 2018-03-07 LAB — LACTIC ACID, PLASMA
Lactic Acid, Venous: 2 mmol/L (ref 0.5–1.9)
Lactic Acid, Venous: 1.7 mmol/L (ref 0.5–1.9)

## 2018-03-07 LAB — LIPASE, BLOOD: Lipase: 24 U/L (ref 11–51)

## 2018-03-07 SURGERY — REPAIR, HERNIA, VENTRAL
Anesthesia: General

## 2018-03-07 MED ORDER — IOPAMIDOL (ISOVUE-370) INJECTION 76%
100.0000 mL | Freq: Once | INTRAVENOUS | Status: AC | PRN
Start: 2018-03-07 — End: 2018-03-07
  Administered 2018-03-07: 100 mL via INTRAVENOUS

## 2018-03-07 MED ORDER — BUSPIRONE HCL 15 MG PO TABS
30.0000 mg | ORAL_TABLET | Freq: Two times a day (BID) | ORAL | Status: DC
  Administered 2018-03-07 – 2018-03-15 (×12): 30 mg via ORAL
  Filled 2018-03-07 (×20): qty 2

## 2018-03-07 MED ORDER — TRAZODONE HCL 100 MG PO TABS
100.0000 mg | ORAL_TABLET | Freq: Every evening | ORAL | Status: DC | PRN
Start: 2018-03-07 — End: 2018-03-15
  Administered 2018-03-10 – 2018-03-15 (×4): 100 mg via ORAL
  Filled 2018-03-07 (×4): qty 1

## 2018-03-07 MED ORDER — SUCCINYLCHOLINE CHLORIDE 20 MG/ML IJ SOLN
INTRAMUSCULAR | Status: AC
  Filled 2018-03-07: qty 1

## 2018-03-07 MED ORDER — ALUM & MAG HYDROXIDE-SIMETH 200-200-20 MG/5ML PO SUSP
30.0000 mL | ORAL | Status: DC | PRN
  Administered 2018-03-07 – 2018-03-11 (×6): 30 mL via ORAL
  Filled 2018-03-07 (×6): qty 30

## 2018-03-07 MED ORDER — OXYCODONE HCL 5 MG/5ML PO SOLN: 5.0000 mg | Freq: Once | ORAL | Status: DC | PRN

## 2018-03-07 MED ORDER — ONDANSETRON HCL 4 MG PO TABS
4.0000 mg | ORAL_TABLET | ORAL | Status: DC | PRN
  Administered 2018-03-10: 4 mg via ORAL
  Filled 2018-03-07: qty 1

## 2018-03-07 MED ORDER — ONDANSETRON HCL 4 MG/2ML IJ SOLN
4.0000 mg | Freq: Four times a day (QID) | INTRAMUSCULAR | Status: DC | PRN
  Administered 2018-03-07 – 2018-03-13 (×10): 4 mg via INTRAVENOUS
  Filled 2018-03-07 (×10): qty 2

## 2018-03-07 MED ORDER — FENTANYL CITRATE (PF) 100 MCG/2ML IJ SOLN
INTRAMUSCULAR | Status: AC
  Filled 2018-03-07: qty 2

## 2018-03-07 MED ORDER — DEXTROSE 5 % IV SOLN
3.0000 g | Freq: Three times a day (TID) | INTRAVENOUS | Status: AC
  Administered 2018-03-07 (×2): 3 g via INTRAVENOUS
  Filled 2018-03-07 (×2): qty 3

## 2018-03-07 MED ORDER — ONDANSETRON 4 MG PO TBDP
4.0000 mg | ORAL_TABLET | Freq: Four times a day (QID) | ORAL | Status: DC | PRN
  Administered 2018-03-08: 4 mg via ORAL
  Filled 2018-03-07: qty 1

## 2018-03-07 MED ORDER — MIDAZOLAM HCL 2 MG/2ML IJ SOLN
INTRAMUSCULAR | Status: DC | PRN
  Administered 2018-03-07: 2 mg via INTRAVENOUS

## 2018-03-07 MED ORDER — LACTATED RINGERS IV SOLN
INTRAVENOUS | Status: DC
  Administered 2018-03-07 (×2): via INTRAVENOUS

## 2018-03-07 MED ORDER — ACETAMINOPHEN 10 MG/ML IV SOLN
INTRAVENOUS | Status: AC
  Filled 2018-03-07: qty 100

## 2018-03-07 MED ORDER — CEFAZOLIN (ANCEF) 1 G IV SOLR
2.0000 g | INTRAVENOUS | Status: DC
  Filled 2018-03-07: qty 2

## 2018-03-07 MED ORDER — MEPERIDINE HCL 50 MG/ML IJ SOLN: 6.2500 mg | INTRAMUSCULAR | Status: DC | PRN

## 2018-03-07 MED ORDER — SODIUM CHLORIDE 0.9 % IV BOLUS
1000.0000 mL | Freq: Once | INTRAVENOUS | Status: AC
  Administered 2018-03-07: 1000 mL via INTRAVENOUS

## 2018-03-07 MED ORDER — LACTATED RINGERS IV SOLN
INTRAVENOUS | Status: DC
  Administered 2018-03-07: 125 mL via INTRAVENOUS
  Administered 2018-03-07: 07:00:00 via INTRAVENOUS

## 2018-03-07 MED ORDER — LEVOTHYROXINE SODIUM 112 MCG PO TABS
112.0000 ug | ORAL_TABLET | Freq: Every day | ORAL | Status: DC
  Administered 2018-03-08 – 2018-03-15 (×7): 112 ug via ORAL
  Filled 2018-03-07 (×8): qty 1

## 2018-03-07 MED ORDER — MORPHINE SULFATE (PF) 2 MG/ML IV SOLN: 2.0000 mg | INTRAVENOUS | Status: DC | PRN

## 2018-03-07 MED ORDER — FENTANYL CITRATE (PF) 100 MCG/2ML IJ SOLN
INTRAMUSCULAR | Status: AC
  Administered 2018-03-07: 50 ug via INTRAVENOUS
  Filled 2018-03-07: qty 2

## 2018-03-07 MED ORDER — ROCURONIUM BROMIDE 100 MG/10ML IV SOLN
INTRAVENOUS | Status: DC | PRN
  Administered 2018-03-07: 10 mg via INTRAVENOUS
  Administered 2018-03-07: 50 mg via INTRAVENOUS
  Administered 2018-03-07 (×2): 10 mg via INTRAVENOUS

## 2018-03-07 MED ORDER — LIDOCAINE HCL (PF) 2 % IJ SOLN
INTRAMUSCULAR | Status: AC
  Filled 2018-03-07: qty 10

## 2018-03-07 MED ORDER — HYDROMORPHONE HCL 2 MG PO TABS
ORAL_TABLET | ORAL | Status: AC
  Filled 2018-03-07: qty 1

## 2018-03-07 MED ORDER — KETOROLAC TROMETHAMINE 15 MG/ML IJ SOLN
15.0000 mg | Freq: Three times a day (TID) | INTRAMUSCULAR | Status: AC
  Administered 2018-03-07 – 2018-03-09 (×6): 15 mg via INTRAVENOUS
  Filled 2018-03-07 (×6): qty 1

## 2018-03-07 MED ORDER — FENTANYL CITRATE (PF) 100 MCG/2ML IJ SOLN
INTRAMUSCULAR | Status: DC | PRN
  Administered 2018-03-07 (×2): 100 ug via INTRAVENOUS

## 2018-03-07 MED ORDER — HYDROMORPHONE HCL 1 MG/ML IJ SOLN
0.5000 mg | INTRAMUSCULAR | Status: DC | PRN
  Administered 2018-03-07 (×2): 0.5 mg via INTRAVENOUS

## 2018-03-07 MED ORDER — DEXAMETHASONE SODIUM PHOSPHATE 10 MG/ML IJ SOLN
INTRAMUSCULAR | Status: DC | PRN
  Administered 2018-03-07: 10 mg via INTRAVENOUS

## 2018-03-07 MED ORDER — MORPHINE SULFATE (PF) 2 MG/ML IV SOLN
2.0000 mg | INTRAVENOUS | Status: DC | PRN
  Administered 2018-03-07 – 2018-03-08 (×5): 2 mg via INTRAVENOUS
  Filled 2018-03-07 (×6): qty 1

## 2018-03-07 MED ORDER — CEFAZOLIN SODIUM 1 G IJ SOLR
INTRAMUSCULAR | Status: AC
  Filled 2018-03-07: qty 30

## 2018-03-07 MED ORDER — OXYCODONE HCL 5 MG PO TABS: 5.0000 mg | ORAL_TABLET | Freq: Once | ORAL | Status: DC | PRN

## 2018-03-07 MED ORDER — SUGAMMADEX SODIUM 500 MG/5ML IV SOLN
INTRAVENOUS | Status: AC
  Filled 2018-03-07: qty 5

## 2018-03-07 MED ORDER — MIDAZOLAM HCL 2 MG/2ML IJ SOLN
INTRAMUSCULAR | Status: AC
  Filled 2018-03-07: qty 2

## 2018-03-07 MED ORDER — MORPHINE SULFATE (PF) 4 MG/ML IV SOLN
4.0000 mg | Freq: Once | INTRAVENOUS | Status: AC
  Administered 2018-03-07: 4 mg via INTRAVENOUS
  Filled 2018-03-07: qty 1

## 2018-03-07 MED ORDER — HYDROMORPHONE HCL 1 MG/ML IJ SOLN
INTRAMUSCULAR | Status: AC
  Administered 2018-03-07: 0.5 mg via INTRAVENOUS
  Filled 2018-03-07: qty 1

## 2018-03-07 MED ORDER — VENLAFAXINE HCL 25 MG PO TABS
100.0000 mg | ORAL_TABLET | Freq: Three times a day (TID) | ORAL | Status: DC
  Administered 2018-03-07 – 2018-03-15 (×16): 100 mg via ORAL
  Filled 2018-03-07 (×29): qty 4

## 2018-03-07 MED ORDER — HYDROXYZINE HCL 25 MG PO TABS: 50.0000 mg | ORAL_TABLET | Freq: Three times a day (TID) | ORAL | Status: DC | PRN

## 2018-03-07 MED ORDER — ROCURONIUM BROMIDE 50 MG/5ML IV SOLN
INTRAVENOUS | Status: AC
  Filled 2018-03-07: qty 1

## 2018-03-07 MED ORDER — PROMETHAZINE HCL 25 MG/ML IJ SOLN
12.5000 mg | INTRAMUSCULAR | Status: DC | PRN
  Administered 2018-03-11 – 2018-03-13 (×7): 12.5 mg via INTRAVENOUS
  Filled 2018-03-07 (×7): qty 1

## 2018-03-07 MED ORDER — DEXAMETHASONE SODIUM PHOSPHATE 10 MG/ML IJ SOLN
INTRAMUSCULAR | Status: AC
  Filled 2018-03-07: qty 1

## 2018-03-07 MED ORDER — LORATADINE 10 MG PO TABS: 10.0000 mg | ORAL_TABLET | Freq: Every day | ORAL | Status: DC | PRN

## 2018-03-07 MED ORDER — BUPIVACAINE-EPINEPHRINE (PF) 0.5% -1:200000 IJ SOLN
INTRAMUSCULAR | Status: DC | PRN
  Administered 2018-03-07: 30 mL

## 2018-03-07 MED ORDER — CLONAZEPAM 0.5 MG PO TABS
0.5000 mg | ORAL_TABLET | Freq: Two times a day (BID) | ORAL | Status: DC | PRN
  Administered 2018-03-10: 0.5 mg via ORAL
  Filled 2018-03-07 (×2): qty 1

## 2018-03-07 MED ORDER — ACETAMINOPHEN 10 MG/ML IV SOLN
1000.0000 mg | Freq: Four times a day (QID) | INTRAVENOUS | Status: DC
  Administered 2018-03-07 – 2018-03-08 (×3): 1000 mg via INTRAVENOUS
  Filled 2018-03-07 (×4): qty 100

## 2018-03-07 MED ORDER — DEXTROSE 5 % IV SOLN
INTRAVENOUS | Status: DC | PRN
  Administered 2018-03-07: 3 g via INTRAVENOUS

## 2018-03-07 MED ORDER — SUGAMMADEX SODIUM 200 MG/2ML IV SOLN
INTRAVENOUS | Status: DC | PRN
  Administered 2018-03-07: 300 mg via INTRAVENOUS

## 2018-03-07 MED ORDER — MORPHINE SULFATE (PF) 4 MG/ML IV SOLN
4.0000 mg | Freq: Once | INTRAVENOUS | Status: AC
  Administered 2018-03-07: 4 mg via INTRAVENOUS

## 2018-03-07 MED ORDER — ONDANSETRON HCL 4 MG/2ML IJ SOLN
INTRAMUSCULAR | Status: DC | PRN
  Administered 2018-03-07: 4 mg via INTRAVENOUS

## 2018-03-07 MED ORDER — FENTANYL CITRATE (PF) 100 MCG/2ML IJ SOLN
25.0000 ug | Freq: Once | INTRAMUSCULAR | Status: AC
  Administered 2018-03-07: 25 ug via INTRAVENOUS
  Filled 2018-03-07: qty 2

## 2018-03-07 MED ORDER — ENOXAPARIN SODIUM 40 MG/0.4ML ~~LOC~~ SOLN: 40.0000 mg | SUBCUTANEOUS | Status: DC

## 2018-03-07 MED ORDER — TIZANIDINE HCL 4 MG PO TABS
4.0000 mg | ORAL_TABLET | Freq: Three times a day (TID) | ORAL | Status: DC | PRN
  Administered 2018-03-07 – 2018-03-15 (×13): 4 mg via ORAL
  Filled 2018-03-07 (×16): qty 1

## 2018-03-07 MED ORDER — PROPOFOL 10 MG/ML IV BOLUS
INTRAVENOUS | Status: DC | PRN
  Administered 2018-03-07: 200 mg via INTRAVENOUS

## 2018-03-07 MED ORDER — LAMOTRIGINE 100 MG PO TABS
150.0000 mg | ORAL_TABLET | Freq: Two times a day (BID) | ORAL | Status: DC
  Administered 2018-03-07 – 2018-03-15 (×12): 150 mg via ORAL
  Filled 2018-03-07 (×14): qty 2

## 2018-03-07 MED ORDER — LIDOCAINE HCL (CARDIAC) PF 100 MG/5ML IV SOSY
PREFILLED_SYRINGE | INTRAVENOUS | Status: DC | PRN
  Administered 2018-03-07: 100 mg via INTRAVENOUS

## 2018-03-07 MED ORDER — FAMOTIDINE IN NACL 20-0.9 MG/50ML-% IV SOLN
20.0000 mg | Freq: Two times a day (BID) | INTRAVENOUS | Status: DC
  Administered 2018-03-07 – 2018-03-08 (×2): 20 mg via INTRAVENOUS
  Filled 2018-03-07 (×2): qty 50

## 2018-03-07 MED ORDER — DIPHENHYDRAMINE HCL 50 MG/ML IJ SOLN
12.5000 mg | INTRAMUSCULAR | Status: AC
  Administered 2018-03-07: 12.5 mg via INTRAVENOUS
  Filled 2018-03-07: qty 1

## 2018-03-07 MED ORDER — HEPARIN SODIUM (PORCINE) 5000 UNIT/ML IJ SOLN
5000.0000 [IU] | Freq: Three times a day (TID) | INTRAMUSCULAR | Status: DC
  Administered 2018-03-07: 5000 [IU] via SUBCUTANEOUS
  Filled 2018-03-07: qty 1

## 2018-03-07 MED ORDER — ONDANSETRON HCL 4 MG/2ML IJ SOLN
INTRAMUSCULAR | Status: AC
  Filled 2018-03-07: qty 2

## 2018-03-07 MED ORDER — FENTANYL CITRATE (PF) 100 MCG/2ML IJ SOLN
25.0000 ug | INTRAMUSCULAR | Status: DC | PRN
  Administered 2018-03-07 (×4): 50 ug via INTRAVENOUS

## 2018-03-07 MED ORDER — PROMETHAZINE HCL 25 MG/ML IJ SOLN: 6.2500 mg | INTRAMUSCULAR | Status: DC | PRN

## 2018-03-07 MED ORDER — BUPIVACAINE-EPINEPHRINE (PF) 0.5% -1:200000 IJ SOLN
INTRAMUSCULAR | Status: AC
  Filled 2018-03-07: qty 30

## 2018-03-07 MED ORDER — SUCCINYLCHOLINE CHLORIDE 20 MG/ML IJ SOLN
INTRAMUSCULAR | Status: DC | PRN
  Administered 2018-03-07: 100 mg via INTRAVENOUS

## 2018-03-07 MED ORDER — PANTOPRAZOLE SODIUM 40 MG PO TBEC
40.0000 mg | DELAYED_RELEASE_TABLET | Freq: Two times a day (BID) | ORAL | Status: DC
  Administered 2018-03-07 – 2018-03-10 (×8): 40 mg via ORAL
  Filled 2018-03-07 (×8): qty 1

## 2018-03-07 MED ORDER — IOPAMIDOL (ISOVUE-300) INJECTION 61%
15.0000 mL | INTRAVENOUS | Status: DC
  Administered 2018-03-07: 15 mL via ORAL

## 2018-03-07 MED ORDER — MORPHINE SULFATE (PF) 4 MG/ML IV SOLN
INTRAVENOUS | Status: AC
  Administered 2018-03-07: 4 mg via INTRAVENOUS
  Filled 2018-03-07: qty 1

## 2018-03-07 MED ORDER — ONDANSETRON HCL 4 MG/2ML IJ SOLN
INTRAMUSCULAR | Status: AC
Start: 2018-03-07 — End: 2018-03-07
  Administered 2018-03-07: 4 mg
  Filled 2018-03-07: qty 2

## 2018-03-07 MED ORDER — ENOXAPARIN SODIUM 40 MG/0.4ML ~~LOC~~ SOLN
40.0000 mg | Freq: Two times a day (BID) | SUBCUTANEOUS | Status: DC
  Administered 2018-03-08 – 2018-03-15 (×15): 40 mg via SUBCUTANEOUS
  Filled 2018-03-07 (×15): qty 0.4

## 2018-03-07 MED ORDER — ACETAMINOPHEN 10 MG/ML IV SOLN
INTRAVENOUS | Status: DC | PRN
  Administered 2018-03-07: 1000 mg via INTRAVENOUS

## 2018-03-07 SURGICAL SUPPLY — 56 items
BLADE SURG 15 STRL SS SAFETY (BLADE) ×2 IMPLANT
BULB RESERV EVAC DRAIN JP 100C (MISCELLANEOUS) ×2 IMPLANT
CANISTER SUCT 1200ML W/VALVE (MISCELLANEOUS) ×2 IMPLANT
CHLORAPREP W/TINT 26ML (MISCELLANEOUS) ×2 IMPLANT
COVER CLAMP SIL LG PBX B (MISCELLANEOUS) ×2 IMPLANT
DRAIN CHANNEL JP 15F RND 16 (MISCELLANEOUS) ×3 IMPLANT
DRAPE CHEST BREAST 77X106 FENE (MISCELLANEOUS) ×1 IMPLANT
DRAPE LAPAROTOMY 100X77 ABD (DRAPES) ×2 IMPLANT
DRSG TEGADERM 2-3/8X2-3/4 SM (GAUZE/BANDAGES/DRESSINGS) ×1 IMPLANT
DRSG TEGADERM 4X4.75 (GAUZE/BANDAGES/DRESSINGS) ×4 IMPLANT
DRSG TELFA 3X8 NADH (GAUZE/BANDAGES/DRESSINGS) ×4 IMPLANT
ELECT BLADE 6 FLAT ULTRCLN (ELECTRODE) ×2 IMPLANT
ELECT REM PT RETURN 9FT ADLT (ELECTROSURGICAL) ×2
ELECTRODE REM PT RTRN 9FT ADLT (ELECTROSURGICAL) ×1 IMPLANT
GAUZE SPONGE 4X4 12PLY STRL (GAUZE/BANDAGES/DRESSINGS) ×2 IMPLANT
GLOVE BIO SURGEON STRL SZ7.5 (GLOVE) ×2 IMPLANT
GLOVE INDICATOR 8.0 STRL GRN (GLOVE) ×2 IMPLANT
GOWN STRL REUS W/ TWL LRG LVL3 (GOWN DISPOSABLE) ×2 IMPLANT
GOWN STRL REUS W/TWL LRG LVL3 (GOWN DISPOSABLE) ×4
GRASPER SUT TROCAR 14GX15 (MISCELLANEOUS) ×1 IMPLANT
HOLDER FOLEY CATH W/STRAP (MISCELLANEOUS) ×1 IMPLANT
KIT TURNOVER KIT A (KITS) ×2 IMPLANT
LABEL OR SOLS (LABEL) ×2 IMPLANT
MESH VENTRALIGHT ST 4X6IN (Mesh General) ×1 IMPLANT
NDL HYPO 25X1 1.5 SAFETY (NEEDLE) ×1 IMPLANT
NEEDLE HYPO 22GX1.5 SAFETY (NEEDLE) ×2 IMPLANT
NEEDLE HYPO 25X1 1.5 SAFETY (NEEDLE) ×2 IMPLANT
NS IRRIG 1000ML POUR BTL (IV SOLUTION) ×2 IMPLANT
NS IRRIG 500ML POUR BTL (IV SOLUTION) ×2 IMPLANT
PACK BASIN MAJOR ARMC (MISCELLANEOUS) ×2 IMPLANT
PACK BASIN MINOR ARMC (MISCELLANEOUS) ×2 IMPLANT
PAD DRESSING TELFA 3X8 NADH (GAUZE/BANDAGES/DRESSINGS) ×1 IMPLANT
RETRACTOR WOUND ALXS 18CM MED (MISCELLANEOUS) IMPLANT
RTRCTR WOUND ALEXIS O 18CM MED (MISCELLANEOUS) ×2
SET YANKAUER POOLE SUCT (MISCELLANEOUS) ×2 IMPLANT
SPONGE LAP 18X18 RF (DISPOSABLE) ×2 IMPLANT
STAPLER SKIN PROX 35W (STAPLE) ×2 IMPLANT
STRIP CLOSURE SKIN 1/2X4 (GAUZE/BANDAGES/DRESSINGS) ×2 IMPLANT
SUT ETH BLK MONO 3 0 FS 1 12/B (SUTURE) ×1 IMPLANT
SUT ETHILON 3-0 FS-10 30 BLK (SUTURE) ×2
SUT MAXON 0 T 12 3 (SUTURE) ×5 IMPLANT
SUT PROLENE 0 CT 1 30 (SUTURE) ×4 IMPLANT
SUT SILK 3-0 (SUTURE) ×1 IMPLANT
SUT SURGILON 0 BLK (SUTURE) ×4 IMPLANT
SUT VIC AB 2-0 BRD 54 (SUTURE) ×2 IMPLANT
SUT VIC AB 2-0 CT1 27 (SUTURE) ×6
SUT VIC AB 2-0 CT1 TAPERPNT 27 (SUTURE) IMPLANT
SUT VIC AB 3-0 SH 27 (SUTURE) ×2
SUT VIC AB 3-0 SH 27X BRD (SUTURE) ×2 IMPLANT
SUT VIC AB 4-0 FS2 27 (SUTURE) ×1 IMPLANT
SUT VICRYL 3-0 SH-1 18IN (SUTURE) ×2 IMPLANT
SUT VICRYL+ 3-0 144IN (SUTURE) ×2 IMPLANT
SUTURE EHLN 3-0 FS-10 30 BLK (SUTURE) IMPLANT
SYR 10ML LL (SYRINGE) ×2 IMPLANT
SYR 3ML LL SCALE MARK (SYRINGE) ×2 IMPLANT
TRAY FOLEY MTR SLVR 16FR STAT (SET/KITS/TRAYS/PACK) ×1 IMPLANT

## 2018-03-07 NOTE — Anesthesia Postprocedure Evaluation (Signed)
Anesthesia Post Note  Patient: Jamie Bennett  Procedure(s) Performed: HERNIA REPAIR VENTRAL ADULT (N/A )  Patient location during evaluation: PACU Anesthesia Type: General Level of consciousness: awake and alert and oriented Pain management: pain level controlled Vital Signs Assessment: post-procedure vital signs reviewed and stable Respiratory status: spontaneous breathing, nonlabored ventilation and respiratory function stable Cardiovascular status: blood pressure returned to baseline and stable Postop Assessment: no signs of nausea or vomiting Anesthetic complications: no     Last Vitals:  Vitals:   03/07/18 1039 03/07/18 1107  BP: (!) 144/85 (!) 150/87  Pulse: 78 76  Resp: 15 17  Temp: 37.3 C 37.3 C  SpO2: 95% 91%    Last Pain:  Vitals:   03/07/18 1107  TempSrc: Oral  PainSc:                  Artice Holohan

## 2018-03-07 NOTE — Op Note (Signed)
Preoperative diagnosis: Ventral hernia with small bowel obstruction.  Postoperative diagnosis: Same.  Operative procedure: Repair of ventral hernia with ventral light ST mesh.  Operating Surgeon: Hervey Ard, MD.  Anesthesia: General endotracheal, Marcaine 0.5% with 1 to 200,000 units of epinephrine, 30 cc.  Estimated blood loss: 75 cc.  Clinical note: This 57 year old morbidly obese female underwent excision of an umbilical sinus last week which involved opening the fascia due to an intraperitoneal component.  Mesh was not used due to concern for possible contamination.  Postoperative course was unremarkable until 2 days ago when she noticed pain and reported swelling.  Exam was really noncontributory but she had worsening pain overnight and presented to the emergency room with a CT showing multiple loops of small bowel through a small moderate distally small fascial defect.  She is brought to the operating for exploration and repair.  The role of prosthetic mesh was reviewed with the family prior to the procedure.  Operative note: The patient underwent general endotracheal anesthesia without difficulty.  The abdomen was cleansed with ChloraPrep and draped.  The previous wound was opened.  The fascial sutures were removed and a very careful return of the small bowel contents of the abdominal cavity was completed.  There is no evidence of nonviable valve.  A small amount of fluid noted on opening the.  The wound was cultured for aerobic organisms.  With a lap pad in place initial plans were to do a retrorectus repair.  It was not possible to separate the posterior rectus sheath on the less inflamed side and it was elected at that time to make use of an intraperitoneal mesh.  0 Maxon transfixion sutures were placed trans-fascially after clearing the anterior rectus sheath circumferentially for a distance of approximately 3 inches on each side.  These sutures were placed sequentially and then tied on  each half to allow extraction of the lap pad.  The midline fascia was approximated with interrupted 0 Surgilon figure-of-eight sutures.  There was a 1 cm area in the midportion of the wound where the closure was completed but "tight".  A ever so tiny bite of the mesh was undertaken with the fascial suture closures visualizing the needle tip as it went through the polypropylene component of the 2 layer mesh.  A 15 [place] Blake drain was brought out through the right lower quadrant and anchored into position with 3-0 nylon.  The 7 cm of adipose tissue was closed in layers with 2-0 Vicryl figure-of-eight sutures.  The subtendinous fat was approximated with interrupted 2-0 Vicryl sutures and the skin closed with staples.  A honeycomb dressing was applied to the wound and Telfa and Tegaderm to the drain site.  The patient was extubated carefully and taken to recovery room in stable condition.

## 2018-03-07 NOTE — Transfer of Care (Signed)
Immediate Anesthesia Transfer of Care Note  Patient: Jamie Bennett  Procedure(s) Performed: HERNIA REPAIR VENTRAL ADULT (N/A )  Patient Location: PACU  Anesthesia Type:General  Level of Consciousness: awake  Airway & Oxygen Therapy: Patient connected to face mask oxygen  Post-op Assessment: Report given to RN  Post vital signs: stable  Last Vitals:  Vitals Value Taken Time  BP 157/90 03/07/2018  9:39 AM  Temp    Pulse 79 03/07/2018  9:39 AM  Resp 22 03/07/2018  9:39 AM  SpO2 100 % 03/07/2018  9:39 AM  Vitals shown include unvalidated device data.  Last Pain:  Vitals:   03/07/18 0622  TempSrc: Oral  PainSc:          Complications: No apparent anesthesia complications

## 2018-03-07 NOTE — H&P (Signed)
SURGICAL HISTORY & PHYSICAL (cpt [phone])  HISTORY OF PRESENT ILLNESS (HPI):  57 y.o. female presented to [facility] ED overnight for abdominal pain with nausea and non-bloody emesis. Patient reports she underwent umbilectomy 6 days ago for chronic draining sinus and abscess with primary (non-mesh) repair of her resulting fascial defect. Post-operatively, she describes she experienced some peri-incisional pain, which acutely worsened 3 days ago and say Dr. [name] in the office yesterday accordingly. Her prescription for pain medication was extended, and close follow-up was planned. Unfortunately, patient subsequently developed further worsened abdominal pain with addition lf non-bloody emesis, for which she presented to [facility] ED. Patient was admitted to surgical service early this morning, and patient was discussed with Dr. [name], who promptly brought patient to the OR early this morning for open repair of her ventral hernia with mesh. While she reports some peri-incisional pain, she also says she feels much better than when she presented overnight, currently denies N/V, fever/chills, CP, or SOB. She also says she does not recall much of overnight events.  PAST MEDICAL HISTORY (PMH):  Past Medical History:  Diagnosis Date  . Anemia   . Anxiety   . Bipolar 1 disorder ([place])   . Collagen vascular disease ([place])    rhematoid arthritis  . Constipation   . Fibromyalgia   . GERD (gastroesophageal reflux disease)   . Heart murmur    mild-asymptomatic  . Hepatic steatosis   . History of Roux-en-Y gastric bypass   . Hypotension   . Hypothyroidism   . Opioid abuse ([place])   . Osteoarthritis   . Osteoporosis   . PONV (postoperative nausea and vomiting)    nausea only  . Rheumatic fever   . Rheumatoid arthritis ([place])   . Thyroid disease     Reviewed. Otherwise negative.   PAST SURGICAL HISTORY ([place]):  Past Surgical History:  Procedure Laterality Date  . CHOLECYSTECTOMY  2004  . COLONOSCOPY N/A  07/16/2017   Procedure: COLONOSCOPY;  Surgeon: Manya Silvas, MD;  Location[facility] ENDOSCOPY;  Service: Endoscopy;  Laterality: N/A;  . EXPLORATORY LAPAROTOMY  2009  . GASTRIC BYPASS  2003   [place] N/A 03/01/2018   Procedure: EXPLORATORY LAPAROTOMY/ UMBILECTOMY;  Surgeon: Robert Bellow, MD;  Location: [facility] ORS;  Service: General;  Laterality: N/A;  . TONSILLECTOMY      Reviewed. Otherwise negative.   MEDICATIONS:  Prior to Admission medications   Medication Sig Start Date End Date Taking? Authorizing Provider  aspirin EC 81 MG tablet Take 81 mg by mouth daily.   Yes [provider]  busPIRone (BUSPAR) 15 MG tablet Take 1 tablet (15 mg total) by mouth 4 (four) times daily. Patient taking differently: Take 30 mg by mouth 2 (two) times daily.  11/20/17 11/20/18 Yes Eksir, Richard Miu, MD  Cholecalciferol (HM VITAMIN D3) 4000 units CAPS Take 4,000 Units by mouth daily.    Yes [provider]  ciclopirox (PENLAC) 8 % solution Apply to affected nail and surrounding nail bed QHS 01/03/18  Yes Boscia, Heather E, NP  clonazePAM (KLONOPIN) 0.5 MG tablet Take 1 tablet (0.5 mg total) by mouth 2 (two) times daily. Patient taking differently: Take 0.5 mg by mouth 2 (two) times daily as needed for anxiety.  07/10/17 07/10/18 Yes Eksir, Richard Miu, MD  Coenzyme Q-10 200 MG CAPS Take 200 mg by mouth 2 (two) times daily.    Yes [provider]  cyanocobalamin (,VITAMIN B-12,) 1000 MCG/ML injection Inject 1,000 mcg into  the muscle every 30 (thirty) days.   Yes [provider]  esomeprazole (NEXIUM) 20 MG capsule Take 20 mg by mouth 2 (two) times daily before a meal.   Yes [provider]  Fiber, Guar Gum, CHEW Chew 5 mg by mouth daily.    Yes [provider]  HYDROcodone-acetaminophen (NORCO) 5-325 MG tablet Take 1 tablet by mouth every 4 (four) hours as needed for moderate pain. 03/06/18  Yes Robert Bellow, MD   hydroxychloroquine (PLAQUENIL) 200 MG tablet Take 1 tablet by mouth twice a day Monday through Friday 12/17/17  Yes Deveshwar, Abel Presto, MD  hydrOXYzine (ATARAX/VISTARIL) 50 MG tablet Take 1 tablet (50 mg total) by mouth 3 (three) times daily as needed for anxiety. 10/09/17  Yes Eksir, Richard Miu, MD  Krill Oil 500 MG CAPS Take 500 mg by mouth daily.   Yes [provider]  lamoTRIgine (LAMICTAL) 150 MG tablet Take 1 tablet (150 mg total) by mouth 2 (two) times daily. 10/09/17  Yes Eksir, Richard Miu, MD  levothyroxine (SYNTHROID, LEVOTHROID) 112 MCG tablet Take 1 tablet (112 mcg total) by mouth daily. Patient taking differently: Take 112 mcg by mouth daily before breakfast.  11/19/17  Yes Boscia, Heather E, NP  loratadine (CLARITIN) 10 MG tablet Take 10 mg by mouth daily as needed for allergies.   Yes [provider]  Magnesium 400 MG CAPS Take 400 mg by mouth 2 (two) times daily.    Yes [provider]  Multiple Vitamin (MULTIVITAMIN WITH MINERALS) TABS tablet Take 1 tablet by mouth daily.   Yes [provider]  polyethylene glycol (MIRALAX) packet Take 17 g by mouth daily. Patient taking differently: Take 17 g by mouth daily as needed for moderate constipation.  02/10/18  Yes Loney Hering, MD  promethazine (PHENERGAN) 25 MG tablet Take 1 tablet (25 mg total) by mouth every 6 (six) hours as needed for nausea or vomiting. 03/06/18  Yes Byrnett, Forest Gleason, MD  senna-docusate (COLACE 2-IN-1) 8.6-50 MG tablet Take 0.5 tablets by mouth daily.   Yes [provider]  sucralfate (CARAFATE) 1 g tablet Take 1 tablet (1 g total) by mouth 2 (two) times daily. 01/09/18 01/09/19 Yes Gregor Hams, MD  tiZANidine (ZANAFLEX) 4 MG tablet Take 1 tablet (4 mg total) by mouth 3 (three) times daily as needed. Patient taking differently: Take 4 mg by mouth 3 (three) times daily as needed for muscle spasms.  01/17/18  Yes Ronnell Freshwater, NP  traZODone (DESYREL) 100 MG  tablet Take 2 tablets (200 mg total) by mouth at bedtime. Patient taking differently: Take 100 mg by mouth at bedtime as needed and may repeat dose one time if needed for sleep.  10/09/17  Yes Eksir, Richard Miu, MD  venlafaxine [facility]) 100 MG tablet Take 1 tablet (100 mg total) by mouth 3 (three) times daily with meals. 10/03/17 10/03/18 Yes Eksir, Richard Miu, MD  zoledronic acid (RECLAST) 5 MG/100ML SOLN injection Inject 5 mg into the vein See admin instructions. Patient takes yearly   Yes [provider]  HYDROcodone-acetaminophen (NORCO) 5-325 MG tablet Take 1-2 tablets by mouth every 4 (four) hours as needed. Patient not taking: Reported on 03/07/2018 03/02/18   Robert Bellow, MD     ALLERGIES:  Allergies  Allergen Reactions  . Lactose Intolerance (Gi) Other (See Comments)    Bloating and GI distress     SOCIAL HISTORY:  Social History   Socioeconomic History  . Marital status:  Married    Spouse name: Not on file  . Number of children: Not on file  . Years of education: Not on file  . Highest education level: Not on file  Occupational History  . Not on file  Social Needs  . Financial resource strain: Not on file  . Food insecurity:    Worry: Not on file    Inability: Not on file  . Transportation needs:    Medical: Not on file    Non-medical: Not on file  Tobacco Use  . Smoking status: Never Smoker  . Smokeless tobacco: Never Used  Substance and Sexual Activity  . Alcohol use: No    Alcohol/week: 0.0 standard drinks  . Drug use: No  . Sexual activity: Never  Lifestyle  . Physical activity:    Days per week: Not on file    Minutes per session: Not on file  . Stress: Not on file  Relationships  . Social connections:    Talks on phone: Not on file    Gets together: Not on file    Attends religious service: Not on file    Active member of club or organization: Not on file    Attends meetings of clubs or organizations: Not on file    Relationship  status: Not on file  . Intimate partner violence:    Fear of current or ex partner: Not on file    Emotionally abused: Not on file    Physically abused: Not on file    Forced sexual activity: Not on file  Other Topics Concern  . Not on file  Social History Narrative  . Not on file    The patient currently resides (home / rehab facility / nursing home): Home The patient normally is (ambulatory / bedbound): Ambulatory  FAMILY HISTORY:  Family History  Problem Relation Age of Onset  . Depression Mother   . Dementia Mother   . Heart disease Mother   . Parkinson's disease Father   . Colon cancer Neg Hx     Otherwise negative.   REVIEW OF SYSTEMS:  Constitutional: denies any other weight loss, fever, chills, or sweats  Eyes: denies any other vision changes, history of eye injury  ENT: denies sore throat, hearing problems  Respiratory: denies shortness of breath, wheezing  Cardiovascular: denies chest pain, palpitations  Gastrointestinal: abdominal pain, N/V, and bowel function as per HPI  Genitourinary: denies burning with urination or urinary frequency Musculoskeletal: denies any other joint pains or cramps  Skin: Denies any other rashes or skin discolorations  Neurological: denies any other headache, dizziness, weakness  Psychiatric: denies any other depression, anxiety   All other review of systems were otherwise negative.  VITAL SIGNS:  Temp:  [97.7 F (36.5 C)-99.4 F (37.4 C)] 98.5 F (36.9 C) (08/08 1245) Pulse Rate:  [62-79] 73 (08/08 1245) Resp:  [14-20] 17 (08/08 1107) BP: (134-160)/(67-113) 149/85 (08/08 1245) SpO2:  [91 %-100 %] 97 % (08/08 1245)             INTAKE/OUTPUT:  This shift: Total I/O In: -  Out: 725 [Urine:550; Emesis/NG output:100; Blood:75]  Last 2 shifts: @IOLAST2SHIFTS @  PHYSICAL EXAM:  Constitutional:  -- Morbidly obese body habitus  -- Awake, alert, and oriented x3, no apparent distress Eyes:  -- Pupils equally round and reactive to  light  -- No scleral icterus, B/L no occular discharge Ear, nose, throat: -- Neck is FROM WNL -- No jugular venous distension  Pulmonary:  -- No  wheezes or rhales -- Equal breath sounds bilaterally -- Breathing non-labored at rest Cardiovascular:  -- S1, S2 present  -- No pericardial rubs  Gastrointestinal:  -- Abdomen soft and obese, not obviously distended with moderate peri-incisional and peri-drain tenderness to palpation with mild peri-incisional ecchymosis without surrounding erythema or any evidence of purulent drainage, no guarding or rebound tenderness, surgically placed drain well-secured with serosanguinous fluid in bulb suction and tubing -- No abdominal masses appreciated, pulsatile or otherwise  Musculoskeletal and Integumentary:  -- Wounds or skin discoloration: None appreciated except as described in detail above (GI) -- Extremities: B/L UE and LE FROM, hands and feet warm  Neurologic:  -- Motor function: Intact and symmetric -- Sensation: Intact and symmetric Psychiatric:  -- Mood and affect WNL  Labs:  CBC Latest Ref Rng & Units 03/07/2018 03/02/2018 02/09/2018  WBC 3.6 - 11.0 K/uL 7.5 6.4 6.8  Hemoglobin 12.0 - 16.0 g/dL 13.7 11.3(L) 13.3  Hematocrit 35.0 - 47.0 % 40.2 33.6(L) 39.8  Platelets 150 - 440 K/uL 282 188 277   CMP Latest Ref Rng & Units 03/07/2018 03/02/2018 02/09/2018  Glucose 70 - 99 mg/dL 141(H) 94 95  BUN 6 - 20 mg/dL 12 13 14   Creatinine 0.44 - 1.00 mg/dL 0.83 0.96 0.99  Sodium 135 - 145 mmol/L 141 143 141  Potassium 3.5 - 5.1 mmol/L 4.1 4.1 3.9  Chloride 98 - 111 mmol/L 104 110 111  CO2 22 - 32 mmol/L 24 26 21(L)  Calcium 8.9 - 10.3 mg/dL 9.9 8.7(L) 9.1  Total Protein 6.5 - 8.1 g/dL 7.9 - 7.4  Total Bilirubin 0.3 - 1.2 mg/dL 0.7 - 0.4  Alkaline Phos 38 - 126 U/L 218(H) - 131(H)  AST 15 - 41 U/L 31 - 20  ALT 0 - 44 U/L 65(H) - 15   Imaging studies:  CT Abdomen and Pelvis with Contrast (03/07/2018) - personally reviewed and discussed with ED  physician, Dr. [name], and patient 1. Small-bowel obstruction with transition point at the site of the large ventral abdominal hernia that contains multiple loops of small bowel. 2. Subcutaneous edema throughout the anterior lower abdominal soft tissues, possibly indicating cellulitis. 3. Cardiomegaly.   Assessment/Plan: (ICD-10's: K45.0) 57 y.o. female with SBO attributed to post-surgical incisional hernia, complicated by pertinent comorbidities including morbid obesity (BMI 56) despite history of Roux-en-Y gastric bypass (2003), collagen vascular disease, hypothyroidism, osteoporosis, rheumatoid arthritis, osteoarthritis, GERD, bipolar disorder, generalized anxiety disorder, fibromyalgia, and history of opiod abuse.    - patient underwent repair of ventral abdominal wall hernia with Dr. [name]   - will defer to Dr. [name] regarding patient's further care, medical management of comorbidities   - DVT prophylaxis, ambulation encouraged  All of the above findings and recommendations were discussed with the patient, and all of her questions were answered to her expressed satisfaction.  -- Marilynne Drivers Rosana Hoes, MD, [place]: [facility] - Partnering for exceptional care. Office: [phone]

## 2018-03-07 NOTE — ED Provider Notes (Signed)
[facility] Emergency Department Provider Note  ____________________________________________   First MD Initiated Contact with Patient 03/07/18 0235     (approximate)  I have reviewed the triage vital signs and the nursing notes.   HISTORY  Chief Complaint No chief complaint on file.    HPI Jamie Bennett is a 57 y.o. female with a complicated medical history as listed below who notably had an umbilectomy performed by Dr. Fleet Contras 6 days ago.  She presents for evaluation of acutely worsening lower abdominal pain with multiple episodes of nausea and vomiting.  She reports that she has had some consistent pain in the area since having the procedure and she even had a follow-up visit with Dr. [name] in his clinic yesterday afternoon and they said the appointment was fine.  However after she got home they noticed some additional bulging around the site of the incision and she had an acute worsening of her pain which she describes as severe.  Any amount of movement makes it worse and nothing makes it better.  She is not able to tolerate anything by mouth and she has vomited multiple times with small amounts.  She denies fever/chills, chest pain, shortness of breath.  She has not had any bowel movements recently.  She had a gastric bypass surgery about 13 years ago.  Past Medical History:  Diagnosis Date  . Anemia   . Anxiety   . Bipolar 1 disorder ([place])   . Collagen vascular disease ([place])    rhematoid arthritis  . Constipation   . Fibromyalgia   . GERD (gastroesophageal reflux disease)   . Heart murmur    mild-asymptomatic  . Hepatic steatosis   . History of Roux-en-Y gastric bypass   . Hypotension   . Hypothyroidism   . Opioid abuse ([place])   . Osteoarthritis   . Osteoporosis   . PONV (postoperative nausea and vomiting)    nausea only  . Rheumatic fever   . Rheumatoid arthritis ([place])   . Thyroid disease     Patient Active Problem List   Diagnosis Date Noted  . SBO (small bowel obstruction) ([place]) 03/07/2018  . Abscess, umbilical 29/93/7169  . Persistent umbilical sinus 67/89/3810  . Umbilical bleeding 17/51/0258  . Tinea pedis of both feet 01/23/2018  . Atopic dermatitis 12/05/2017  . Primary osteoarthritis of both knees 01/19/2017  . Suicide attempt ([place]) 07/10/2016  . Fibromyalgia 07/10/2016  . High risk medication use 07/10/2016  . AKI (acute kidney injury) ([place]) 11/09/2015  . Elevated troponin 11/09/2015  . Hypotension 11/09/2015  . Respiratory failure ([place])   . Acute hepatic failure 11/08/2015  . Drug overdose 11/08/2015  . Fatty infiltration of liver 02/16/2015  . Hepatic fibrosis 02/16/2015  . Abnormal serum level of alkaline phosphatase 02/15/2015  . Iron deficiency anemia 02/05/2015  . Vitamin B 12 deficiency 02/05/2015  . OP (osteoporosis) 06/16/2014  . Rheumatoid arteritis 03/02/2014  . Osteoarthritis of left hip 03/02/2014  . Hypothyroidism 03/02/2014  . Rheumatic fever without heart involvement 03/02/2014  . Adult hypothyroidism 03/02/2014  . Arthritis of pelvic region, degenerative 03/02/2014  . Bipolar 1 disorder, depressed ([place]) 02/26/2014  . Bariatric surgery status 11/24/2013  . Affective bipolar disorder ([place]) 11/24/2013  . Bipolar affective disorder ([place]) 11/24/2013  . Rheumatoid arthritis ([place]) 11/24/2013  . Polysubstance (excluding opioids) dependence ([place]) 09/11/2013  . Polysubstance dependence ([place]) 09/11/2013  . Combined drug dependence excluding opioids ([place]) 09/11/2013  . Arthritis or polyarthritis, rheumatoid ([place]) 09/05/2013  .  Leg weakness 09/05/2013    Past Surgical History:  Procedure Laterality Date  . CHOLECYSTECTOMY  2004  . COLONOSCOPY N/A 07/16/2017   Procedure: COLONOSCOPY;  Surgeon: Manya Silvas, MD;  Location: [facility] ENDOSCOPY;  Service: Endoscopy;  Laterality: N/A;  . EXPLORATORY LAPAROTOMY  2009  . GASTRIC BYPASS  2003   [place] N/A  03/01/2018   Procedure: EXPLORATORY LAPAROTOMY/ UMBILECTOMY;  Surgeon: Robert Bellow, MD;  Location: [facility] ORS;  Service: General;  Laterality: N/A;  . TONSILLECTOMY      Prior to Admission medications   Medication Sig Start Date End Date Taking? Authorizing Provider  aspirin EC 81 MG tablet Take 81 mg by mouth daily.   Yes [provider]  busPIRone (BUSPAR) 15 MG tablet Take 1 tablet (15 mg total) by mouth 4 (four) times daily. Patient taking differently: Take 30 mg by mouth 2 (two) times daily.  11/20/17 11/20/18 Yes Eksir, Richard Miu, MD  Cholecalciferol (HM VITAMIN D3) 4000 units CAPS Take 4,000 Units by mouth daily.    Yes [provider]  ciclopirox (PENLAC) 8 % solution Apply to affected nail and surrounding nail bed QHS 01/03/18  Yes Boscia, Heather E, NP  clonazePAM (KLONOPIN) 0.5 MG tablet Take 1 tablet (0.5 mg total) by mouth 2 (two) times daily. Patient taking differently: Take 0.5 mg by mouth 2 (two) times daily as needed for anxiety.  07/10/17 07/10/18 Yes Eksir, Richard Miu, MD  Coenzyme Q-10 200 MG CAPS Take 200 mg by mouth 2 (two) times daily.    Yes [provider]  cyanocobalamin (,VITAMIN B-12,) 1000 MCG/ML injection Inject 1,000 mcg into the muscle every 30 (thirty) days.   Yes [provider]  esomeprazole (NEXIUM) 20 MG capsule Take 20 mg by mouth 2 (two) times daily before a meal.   Yes [provider]  Fiber, Guar Gum, CHEW Chew 5 mg by mouth daily.    Yes [provider]  HYDROcodone-acetaminophen (NORCO) 5-325 MG tablet Take 1 tablet by mouth every 4 (four) hours as needed for moderate pain. 03/06/18  Yes Robert Bellow, MD  hydroxychloroquine (PLAQUENIL) 200 MG tablet Take 1 tablet by mouth twice a day Monday through Friday 12/17/17  Yes Deveshwar, Abel Presto, MD  hydrOXYzine (ATARAX/VISTARIL) 50 MG tablet Take 1 tablet (50 mg total) by mouth 3 (three) times daily as needed for anxiety. 10/09/17  Yes Eksir,  Richard Miu, MD  Krill Oil 500 MG CAPS Take 500 mg by mouth daily.   Yes [provider]  lamoTRIgine (LAMICTAL) 150 MG tablet Take 1 tablet (150 mg total) by mouth 2 (two) times daily. 10/09/17  Yes Eksir, Richard Miu, MD  levothyroxine (SYNTHROID, LEVOTHROID) 112 MCG tablet Take 1 tablet (112 mcg total) by mouth daily. Patient taking differently: Take 112 mcg by mouth daily before breakfast.  11/19/17  Yes Boscia, Heather E, NP  loratadine (CLARITIN) 10 MG tablet Take 10 mg by mouth daily as needed for allergies.   Yes [provider]  Magnesium 400 MG CAPS Take 400 mg by mouth 2 (two) times daily.    Yes [provider]  Multiple Vitamin (MULTIVITAMIN WITH MINERALS) TABS tablet Take 1 tablet by mouth daily.   Yes [provider]  polyethylene glycol (MIRALAX) packet Take 17 g by mouth daily. Patient taking differently: Take 17 g by mouth daily as needed for moderate constipation.  02/10/18  Yes Loney Hering, MD  promethazine (PHENERGAN) 25 MG tablet Take 1  tablet (25 mg total) by mouth every 6 (six) hours as needed for nausea or vomiting. 03/06/18  Yes Byrnett, Forest Gleason, MD  senna-docusate (COLACE 2-IN-1) 8.6-50 MG tablet Take 0.5 tablets by mouth daily.   Yes [provider]  sucralfate (CARAFATE) 1 g tablet Take 1 tablet (1 g total) by mouth 2 (two) times daily. 01/09/18 01/09/19 Yes Gregor Hams, MD  tiZANidine (ZANAFLEX) 4 MG tablet Take 1 tablet (4 mg total) by mouth 3 (three) times daily as needed. Patient taking differently: Take 4 mg by mouth 3 (three) times daily as needed for muscle spasms.  01/17/18  Yes Ronnell Freshwater, NP  traZODone (DESYREL) 100 MG tablet Take 2 tablets (200 mg total) by mouth at bedtime. Patient taking differently: Take 100 mg by mouth at bedtime as needed and may repeat dose one time if needed for sleep.  10/09/17  Yes Eksir, Richard Miu, MD  venlafaxine [facility]) 100 MG tablet Take 1 tablet (100 mg total)  by mouth 3 (three) times daily with meals. 10/03/17 10/03/18 Yes Eksir, Richard Miu, MD  zoledronic acid (RECLAST) 5 MG/100ML SOLN injection Inject 5 mg into the vein See admin instructions. Patient takes yearly   Yes [provider]  HYDROcodone-acetaminophen (NORCO) 5-325 MG tablet Take 1-2 tablets by mouth every 4 (four) hours as needed. Patient not taking: Reported on 03/07/2018 03/02/18   Robert Bellow, MD    Allergies Lactose intolerance (gi)  Family History  Problem Relation Age of Onset  . Depression Mother   . Dementia Mother   . Heart disease Mother   . Parkinson's disease Father   . Colon cancer Neg Hx     Social History Social History   Tobacco Use  . Smoking status: Never Smoker  . Smokeless tobacco: Never Used  Substance Use Topics  . Alcohol use: No    Alcohol/week: 0.0 standard drinks  . Drug use: No    Review of Systems Constitutional: No fever/chills Eyes: No visual changes. ENT: No sore throat. Cardiovascular: Denies chest pain. Respiratory: Denies shortness of breath. Gastrointestinal: Worsening abdominal pain with nausea and vomiting as described above Genitourinary: Negative for dysuria. Musculoskeletal: Negative for neck pain.  Negative for back pain. Integumentary: Negative for rash. Neurological: Negative for headaches, focal weakness or numbness.   ____________________________________________   PHYSICAL EXAM:  ED Triage Vitals  Enc Vitals Group     BP 03/07/18 0408 (!) 160/109     Pulse Rate 03/07/18 0408 62     Resp 03/07/18 0408 18     Temp --      Temp src --      SpO2 03/07/18 0408 97 %     Weight --      Height --      Head Circumference --      Peak Flow --      Pain Score 03/07/18 0323 8     Pain Loc --      Pain Edu? --      Excl. in [place]? --      Constitutional: Alert and oriented.  Appears to be in moderate to severe distress from pain. Eyes: Conjunctivae are normal.  Head: Atraumatic. Nose: No  congestion/rhinnorhea. Mouth/Throat: Mucous membranes are moist. Neck: No stridor.  No meningeal signs.   Cardiovascular: Normal rate, regular rhythm. Good peripheral circulation. Grossly normal heart sounds. Respiratory: Normal respiratory effort.  No retractions. Lungs CTAB. Gastrointestinal: Morbid obesity.  Well-appearing surgical wound with associated subacute ecchymosis.  There is a soft and easily palpable but severely tender protrusion consistent with an abdominal or ventral hernia.   The rest of her abdomen does appear somewhat distended and she has generalized tenderness to palpation throughout. musculoskeletal: No lower extremity tenderness nor edema. No gross deformities of extremities. Neurologic:  Normal speech and language. No gross focal neurologic deficits are appreciated.  Skin:  Skin is warm, dry and intact. No rash noted.  ____________________________________________   LABS (all labs ordered are listed, but only abnormal results are displayed)  Labs Reviewed  CBC WITH DIFFERENTIAL/PLATELET - Abnormal; Notable for the following components:      Result Value   Lymphs Abs 0.8 (*)    All other components within normal limits  COMPREHENSIVE METABOLIC PANEL - Abnormal; Notable for the following components:   Glucose, Bld 141 (*)    ALT 65 (*)    Alkaline Phosphatase 218 (*)    All other components within normal limits  LACTIC ACID, PLASMA - Abnormal; Notable for the following components:   Lactic Acid, Venous 2.0 (*)    All other components within normal limits  LIPASE, BLOOD  HIV ANTIBODY (ROUTINE TESTING)  LACTIC ACID, PLASMA   ____________________________________________  EKG  No EKG ordered by ED physician ____________________________________________  RADIOLOGY   ED MD interpretation: Small bowel obstruction with a ventral abdominal hernia  Official radiology report(s): Ct Abdomen Pelvis W Contrast  Result Date: 03/07/2018 CLINICAL DATA:  Nausea and  vomiting. Bowel obstruction. Recent umbilical hernia repair. EXAM: CT ABDOMEN AND PELVIS WITH CONTRAST TECHNIQUE: Multidetector CT imaging of the abdomen and pelvis was performed using the standard protocol following bolus administration of intravenous contrast. CONTRAST:  164mL ISOVUE-370 IOPAMIDOL (ISOVUE-370) INJECTION 76% COMPARISON:  02/10/2018 FINDINGS: LOWER CHEST: Moderate cardiomegaly. No basilar pulmonary abnormality. HEPATOBILIARY: Normal hepatic contours and density. No intra- or extrahepatic biliary dilatation. Status post cholecystectomy. PANCREAS: Normal parenchymal contours without ductal dilatation. No peripancreatic fluid collection. SPLEEN: Normal. ADRENALS/URINARY TRACT: --Adrenal glands: Normal. --Right kidney/ureter: No hydronephrosis, nephroureterolithiasis, perinephric stranding or solid renal mass. --Left kidney/ureter: No hydronephrosis, nephroureterolithiasis, perinephric stranding or solid renal mass. --Urinary bladder: Normal for degree of distention STOMACH/BOWEL: --Stomach/Duodenum: Status post gastrectomy. The duodenum is markedly dilated. --Small bowel: There is diffuse small bowel dilatation with a transition point at the site of the large ventral abdominal hernia, that contains a long segment of small bowel. --Colon: No focal abnormality. --Appendix: Normal. VASCULAR/LYMPHATIC: Normal course and caliber of the major abdominal vessels. No abdominal or pelvic lymphadenopathy. REPRODUCTIVE: Normal uterus and ovaries. MUSCULOSKELETAL. Severe right L5-S1 facet arthrosis. Normal alignment. Multilevel mild height loss of the lower thoracic spine. OTHER: Subcutaneous edema of the lower abdominal anterior soft tissues. IMPRESSION: 1. Small-bowel obstruction with transition point at the site of the large ventral abdominal hernia that contains multiple loops of small bowel. 2. Subcutaneous edema throughout the anterior lower abdominal soft tissues, possibly indicating cellulitis. 3.  Cardiomegaly. Electronically Signed   By: Ulyses Jarred M.D.   On: 03/07/2018 04:00    ____________________________________________   PROCEDURES  Critical Care performed: No   Procedure(s) performed:   Procedures   ____________________________________________   INITIAL IMPRESSION / ASSESSMENT AND PLAN / ED COURSE  As part of my medical decision making, I reviewed the following data within the [place] notes reviewed and incorporated, Labs reviewed , Old chart reviewed, Discussed with admitting physician , A consult was requested and obtained from this/these consultant(s) Surgery and Notes from prior ED visits  Differential diagnosis includes, but is not limited to, postoperative abscess, SBO, normal postoperative pain.  Lab work is generally reassuring with a normal CBC and no leukocytosis, normal lipase, normal comprehensive metabolic panel.  Lactic acid is pending.  Vital signs are generally reassuring with no tachycardia but some hypertension.  The patient was in a great deal of distress upon arrival and was given morphine 4 mg IV and Zofran 4 mg IV as well as 1 L normal saline IV.  I plan to obtain a CT scan of the abdomen pelvis with oral and IV contrast if possible to evaluate for any bowel injury as well as the other potential intra-abdominal abnormalities as described above.  Clinical Course as of Mar 08 507  Thu Mar 07, 2018  0327 The patient is noted to have a lactate = 2. With the current information available to me, I don't think the patient is septic. The lactate of 2 is related to decreased oral intake / fluid status.  She is receiving 1L NS IV bolus and we will recheck a lactic acid; I anticipate the follow up will be within normal limits.  Lactic Acid, Venous(!!): 2.0 [CF]  0500 The patient was unable to tolerate the oral contrast.  I proceeded with an IV only CT abdomen pelvis which was consistent with small bowel obstruction with multiple  dilated loops and a transition point.  The loops of bowel are within a new ventral abdominal hernia.  I discussed the case by phone with Dr. Rosana Hoes who reviewed the CT scan.  He is putting in orders to bring her to the floor and will see her shortly.  The patient's distress improved significantly after a second dose of morphine 4 mill grams IV.  I also ordered fentanyl 25 mcg IV prior to the placement of an NG tube.  She also had some localized urticaria around her IV site after the morphine so I gave her Benadryl 12.5 mg IV.  She still needs a repeat lactic acid after the IV fluids and Dr. Rosana Hoes will put in IV fluid orders and n.p.o. orders.   [CF]  0502 The patient and husband have been informed of the plan and they understand and agree   [CF]    Clinical Course User Index [CF] Hinda Kehr, MD    ____________________________________________  FINAL CLINICAL IMPRESSION(S) / ED DIAGNOSES  Final diagnoses:  SBO (small bowel obstruction) ([place])  Postoperative abdominal hernia with obstruction     MEDICATIONS GIVEN DURING THIS VISIT:  Medications  iopamidol (ISOVUE-300) 61 % injection 15 mL (15 mLs Oral Contrast Given 03/07/18 0309)  heparin injection 5,000 Units (has no administration in time range)  lactated ringers infusion (has no administration in time range)  morphine 2 MG/ML injection 2 mg (has no administration in time range)  ondansetron (ZOFRAN-ODT) disintegrating tablet 4 mg (has no administration in time range)    Or  ondansetron (ZOFRAN) injection 4 mg (has no administration in time range)  famotidine (PEPCID) IVPB 20 mg premix (has no administration in time range)  ondansetron (ZOFRAN) 4 MG/2ML injection (4 mg  Given 03/07/18 0246)  sodium chloride 0.9 % bolus 1,000 mL (0 mLs Intravenous Stopped 03/07/18 0403)  morphine 4 MG/ML injection 4 mg (4 mg Intravenous Given 03/07/18 0246)  iopamidol (ISOVUE-370) 76 % injection 100 mL (100 mLs Intravenous Contrast Given 03/07/18 0331)    morphine 4 MG/ML injection 4 mg (4 mg Intravenous Given 03/07/18 0405)  diphenhydrAMINE (BENADRYL) injection 12.5 mg (12.5 mg Intravenous  Given 03/07/18 0450)  fentaNYL (SUBLIMAZE) injection 25 mcg (25 mcg Intravenous Given 03/07/18 0452)     ED Discharge Orders    None       Note:  This document was prepared using Dragon voice recognition software and may include unintentional dictation errors.    Hinda Kehr, MD 03/07/18 [phone]

## 2018-03-07 NOTE — Anesthesia Procedure Notes (Signed)
Procedure Name: Intubation Date/Time: 03/07/2018 7:32 AM Performed by: Aline Brochure, CRNA Pre-anesthesia Checklist: Patient identified, Emergency Drugs available, Suction available and Patient being monitored Patient Re-evaluated:Patient Re-evaluated prior to induction Oxygen Delivery Method: Circle system utilized Preoxygenation: Pre-oxygenation with 100% oxygen Induction Type: IV induction, Rapid sequence and Cricoid Pressure applied Laryngoscope Size: Mac and 3 Grade View: Grade I Tube type: Oral Tube size: 7.0 mm Number of attempts: 1 Airway Equipment and Method: Stylet Placement Confirmation: ETT inserted through vocal cords under direct vision,  positive ETCO2 and breath sounds checked- equal and bilateral Secured at: 21 cm Tube secured with: Tape Dental Injury: Teeth and Oropharynx as per pre-operative assessment

## 2018-03-07 NOTE — Progress Notes (Signed)
Events of the night reviewed.    Lungs: Clear. Cardio: RR. ABD: Change in contour of wound with prominent proximal bulge consistent with hernia.   CT reviewed. Labs reviewed. (?source of elevated lactate with normal CBC).  Fascial defect at site of resection of chronic inflammatory process involving the umbilical skin. Mesh not placed at that time secondary to concern for active infection. While no bowel compromise, patient has now been symptomatic for about 36 hours.  Early rather than late repair is indicated. Plans for mesh repair reviewed. Unlikely candidate for absorbable mesh based on body habitus, but might be required based on operative findings.  Plan: Exploratoin.

## 2018-03-07 NOTE — Progress Notes (Signed)
Patient has ambulated. Using incentive at 1200. Reviewed operative findings. Hold po until AM.

## 2018-03-07 NOTE — Progress Notes (Signed)
Patient transferred to room 229 via hospital stretcher around [phone]. Soon after patient arrived, was notified by surgeon that patient was going for surgery within the next 48minutes. Around 0630 report given to [facility], consent was signed and shortly thereafter, patient was transported to surgery between 0645 and 0700. Notified oncoming nurse and gave her report.

## 2018-03-07 NOTE — Progress Notes (Addendum)
Patient discussed with with Dr. Karma Greaser, chart reviewed, and CT personally reviewed, compared with prior imaging studies (including recent CT 02/10/2018). There is a new large ventral hernia containing multiple loops of small intestine, both dilated and decompressed, with a 4 cm fascial defect at site of patient's recent umbilectomy last week (Byrnett, 03/01/2018). There are no signs of thickened or threatened bowel on CT, and WBC is WNL. Dr. Karma Greaser to attempt reduction and anticipate patient will require surgery this admission, but meanwhile, agree with nasogastric decompression and admission to surgical service. Will discuss with Dr. [name] and full H&P to follow. Please call if any questions or concerns.  -- Marilynne Drivers Rosana Hoes, MD, [place]: [facility] - Partnering for exceptional care. Office: [phone]

## 2018-03-07 NOTE — Anesthesia Preprocedure Evaluation (Signed)
Anesthesia Evaluation  Patient identified by MRN, date of birth, ID band Patient awake    Reviewed: Allergy & Precautions, NPO status , Patient's Chart, lab work & pertinent test results  History of Anesthesia Complications (+) PONV and history of anesthetic complications  Airway Mallampati: II  TM Distance: >3 FB Neck ROM: Full    Dental no notable dental hx.    Pulmonary neg pulmonary ROS, neg sleep apnea, neg COPD,    breath sounds clear to auscultation- rhonchi (-) wheezing      Cardiovascular (-) hypertension(-) CAD, (-) Past MI, (-) Cardiac Stents and (-) CABG  Rhythm:Regular Rate:Normal - Systolic murmurs and - Diastolic murmurs    Neuro/Psych PSYCHIATRIC DISORDERS Anxiety Bipolar Disorder negative neurological ROS     GI/Hepatic Neg liver ROS, GERD  ,  Endo/Other  neg diabetesHypothyroidism   Renal/GU negative Renal ROS     Musculoskeletal  (+) Arthritis , Fibromyalgia -  Abdominal (+) + obese,   Peds  Hematology  (+) anemia ,   Anesthesia Other Findings Past Medical History: No date: Anemia No date: Anxiety No date: Bipolar 1 disorder (HCC) No date: Collagen vascular disease (HCC)     Comment:  rhematoid arthritis No date: Constipation No date: Fibromyalgia No date: GERD (gastroesophageal reflux disease) No date: Heart murmur     Comment:  mild-asymptomatic No date: Hepatic steatosis No date: History of Roux-en-Y gastric bypass No date: Hypotension No date: Hypothyroidism No date: Opioid abuse (HCC) No date: Osteoarthritis No date: Osteoporosis No date: PONV (postoperative nausea and vomiting)     Comment:  nausea only No date: Rheumatic fever No date: Rheumatoid arthritis (HCC) No date: Thyroid disease   Reproductive/Obstetrics                             Anesthesia Physical Anesthesia Plan  ASA: III and emergent  Anesthesia Plan: General   Post-op Pain  Management:    Induction: Intravenous  PONV Risk Score and Plan: 3  Airway Management Planned: Oral ETT  Additional Equipment:   Intra-op Plan:   Post-operative Plan: Extubation in OR  Informed Consent: I have reviewed the patients History and Physical, chart, labs and discussed the procedure including the risks, benefits and alternatives for the proposed anesthesia with the patient or authorized representative who has indicated his/her understanding and acceptance.   Dental advisory given  Plan Discussed with: CRNA and Anesthesiologist  Anesthesia Plan Comments:         Anesthesia Quick Evaluation

## 2018-03-07 NOTE — ED Notes (Signed)
Pt up to use the restroom at this time. Husband in room with pt.

## 2018-03-07 NOTE — ED Notes (Signed)
See paper chart for triage notes.

## 2018-03-07 NOTE — Plan of Care (Signed)

## 2018-03-07 NOTE — Anesthesia Post-op Follow-up Note (Signed)
Anesthesia QCDR form completed.        

## 2018-03-08 LAB — BASIC METABOLIC PANEL
Anion gap: 8 (ref 5–15)
BUN: 9 mg/dL (ref 6–20)
CO2: 27 mmol/L (ref 22–32)
Calcium: 8.4 mg/dL — ABNORMAL LOW (ref 8.9–10.3)
Chloride: 108 mmol/L (ref 98–111)
Creatinine, Ser: 0.89 mg/dL (ref 0.44–1.00)
GFR calc Af Amer: >60 mL/min (ref >60)
GFR calc non Af Amer: >60 mL/min (ref >60)
Glucose, Bld: 103 mg/dL — ABNORMAL HIGH (ref 70–99)
Potassium: 3.4 mmol/L — ABNORMAL LOW (ref 3.5–5.1)
Sodium: 143 mmol/L (ref 135–145)

## 2018-03-08 LAB — CBC
HCT: 34 % — ABNORMAL LOW (ref 35.0–47.0)
Hemoglobin: 11.4 g/dL — ABNORMAL LOW (ref 12.0–16.0)
MCH: 30.4 pg (ref 26.0–34.0)
MCHC: 33.5 g/dL (ref 32.0–36.0)
MCV: 90.6 fL (ref 80.0–100.0)
Platelets: 220 10*3/uL (ref 150–440)
RBC: 3.75 MIL/uL — ABNORMAL LOW (ref 3.80–5.20)
RDW: 13.4 % (ref 11.5–14.5)
WBC: 7.4 10*3/uL (ref 3.6–11.0)

## 2018-03-08 MED ORDER — HYDROMORPHONE HCL 1 MG/ML IJ SOLN
0.5000 mg | INTRAMUSCULAR | Status: DC | PRN
  Administered 2018-03-08 – 2018-03-14 (×25): 0.5 mg via INTRAVENOUS
  Filled 2018-03-08 (×25): qty 0.5

## 2018-03-08 MED ORDER — ACETAMINOPHEN 160 MG/5ML PO SOLN
500.0000 mg | ORAL | Status: DC
  Administered 2018-03-08 – 2018-03-11 (×13): 500 mg via ORAL
  Filled 2018-03-08 (×19): qty 20.3

## 2018-03-08 MED ORDER — HYDROMORPHONE HCL 1 MG/ML IJ SOLN
0.5000 mg | INTRAMUSCULAR | Status: DC | PRN
  Administered 2018-03-08 (×4): 0.5 mg via INTRAVENOUS
  Filled 2018-03-08 (×4): qty 0.5

## 2018-03-08 MED ORDER — KCL IN DEXTROSE-NACL 20-5-0.45 MEQ/L-%-% IV SOLN
INTRAVENOUS | Status: DC
  Administered 2018-03-08 – 2018-03-09 (×2): via INTRAVENOUS
  Filled 2018-03-08 (×3): qty 1000

## 2018-03-08 MED ORDER — POTASSIUM CHLORIDE 2 MEQ/ML IV SOLN
INTRAVENOUS | Status: DC
  Filled 2018-03-08: qty 1000

## 2018-03-08 MED ORDER — SUCRALFATE 1 GM/10ML PO SUSP
1.0000 g | Freq: Three times a day (TID) | ORAL | Status: DC
  Administered 2018-03-08 – 2018-03-11 (×12): 1 g via ORAL
  Filled 2018-03-08 (×12): qty 10

## 2018-03-08 MED ORDER — POTASSIUM CHLORIDE 2 MEQ/ML IV SOLN: INTRAVENOUS | Status: DC

## 2018-03-08 MED ORDER — OXYCODONE HCL 5 MG PO TABS
5.0000 mg | ORAL_TABLET | ORAL | Status: DC | PRN
  Administered 2018-03-08 – 2018-03-11 (×12): 5 mg via ORAL
  Filled 2018-03-08 (×12): qty 1

## 2018-03-08 NOTE — Progress Notes (Signed)
Tolerating clear liquids well. Anticipate advance to full liquids tomorrow. When soft diet initiated, will have her restart Miralax and Metamucil she normally takes to minimize constipation ( a chronic problem for her). Drain out when < 30 cc/ day. Encouraged to lean more on po meds than IV dilaudid. Hope to stop the later in 24-48 hours. IV fluids can be d/c'd when po intake well established. OR culture negative for organisms, only some WBC. Have not extended IV antibiotic coverage as mesh is below fascia and drain above.

## 2018-03-08 NOTE — Progress Notes (Signed)
AVSS. Voiding well. Using incentive at 1750. Had a "catch" in left flank last night that interfered with sleep.  Minimal incisional pain. Lungs: Clear. ABD: Soft. Dressing: Dry. JP: 110 since OR; sero sang. Labs: Fine except mild hypokalemia. Plan: Start po liquids. Change to oral meds. Ambulate.

## 2018-03-08 NOTE — Progress Notes (Signed)
Pt provided with K-pad. Pt educated on use and educated not to use it against her bare skin. Pt verbalizes understanding. Pt denies further needs at this time.

## 2018-03-09 LAB — BASIC METABOLIC PANEL
Anion gap: 8 (ref 5–15)
BUN: 10 mg/dL (ref 6–20)
CO2: 27 mmol/L (ref 22–32)
Calcium: 8.2 mg/dL — ABNORMAL LOW (ref 8.9–10.3)
Chloride: 104 mmol/L (ref 98–111)
Creatinine, Ser: 0.86 mg/dL (ref 0.44–1.00)
GFR calc Af Amer: >60 mL/min (ref >60)
GFR calc non Af Amer: >60 mL/min (ref >60)
Glucose, Bld: 100 mg/dL — ABNORMAL HIGH (ref 70–99)
Potassium: 3.4 mmol/L — ABNORMAL LOW (ref 3.5–5.1)
Sodium: 139 mmol/L (ref 135–145)

## 2018-03-09 LAB — HIV ANTIBODY (ROUTINE TESTING W REFLEX): HIV Screen 4th Generation wRfx: NONREACTIVE

## 2018-03-09 NOTE — Progress Notes (Addendum)
Subjective:    Jamie Bennett is a 57 y.o. female  Hospital stay day 2, 2 Days Post-Op ventral hernia repair with mesh  No acute issues.  Still in decent amount of discomfort, but able to walk to bathroom with assistance  ROS:  A 5 point review of systems was performed and pertinent positives and negatives noted in HPI.    Objective:      Temp:  [98.4 F (36.9 C)-98.6 F (37 C)] 98.6 F (37 C) (08/10 0420) Pulse Rate:  [57-68] 68 (08/10 0420) Resp:  [18-20] 20 (08/10 0420) BP: (78-111)/(43-68) 111/68 (08/10 0420) SpO2:  [96 %-98 %] 96 % (08/10 0420)     Height: 5\' 5"  (165.1 cm) Weight: (!) 151.5 kg BMI (Calculated): 55.58   Intake/Output this shift:  No intake/output data recorded.       Constitutional :  alert, cooperative and appears stated age  Respiratory:  clear to auscultation bilaterally  Cardiovascular:  regular rate and rhythm  Gastrointestinal: soft, but tender along incision lines.  drain with serosanguinous fluid.  no discharge erythema or induration to indicate wound infection..   Skin: Cool and moist.   Psychiatric: Normal affect, non-agitated, not confused       LABS:  CMP Latest Ref Rng & Units 03/09/2018 03/08/2018 03/07/2018  Glucose 70 - 99 mg/dL 100(H) 103(H) 141(H)  BUN 6 - 20 mg/dL 10 9 12   Creatinine 0.44 - 1.00 mg/dL 0.86 0.89 0.83  Sodium 135 - 145 mmol/L 139 143 141  Potassium 3.5 - 5.1 mmol/L 3.4(L) 3.4(L) 4.1  Chloride 98 - 111 mmol/L 104 108 104  CO2 22 - 32 mmol/L 27 27 24   Calcium 8.9 - 10.3 mg/dL 8.2(L) 8.4(L) 9.9  Total Protein 6.5 - 8.1 g/dL - - 7.9  Total Bilirubin 0.3 - 1.2 mg/dL - - 0.7  Alkaline Phos 38 - 126 U/L - - 218(H)  AST 15 - 41 U/L - - 31  ALT 0 - 44 U/L - - 65(H)   CBC Latest Ref Rng & Units 03/08/2018 03/07/2018 03/02/2018  WBC 3.6 - 11.0 K/uL 7.4 7.5 6.4  Hemoglobin 12.0 - 16.0 g/dL 11.4(L) 13.7 11.3(L)  Hematocrit 35.0 - 47.0 % 34.0(L) 40.2 33.6(L)  Platelets 150 - 440 K/uL 220 282 188    Assessment:   Open  ventral hernia repair with mesh. Continue current management. Minimize narcotic use.  Advance to full liquid

## 2018-03-10 DIAGNOSIS — R1084 Generalized abdominal pain: Secondary | ICD-10-CM | POA: Insufficient documentation

## 2018-03-10 LAB — BASIC METABOLIC PANEL
Anion gap: 8 (ref 5–15)
BUN: 7 mg/dL (ref 6–20)
CO2: 27 mmol/L (ref 22–32)
Calcium: 8.3 mg/dL — ABNORMAL LOW (ref 8.9–10.3)
Chloride: 105 mmol/L (ref 98–111)
Creatinine, Ser: 0.81 mg/dL (ref 0.44–1.00)
GFR calc Af Amer: >60 mL/min (ref >60)
GFR calc non Af Amer: >60 mL/min (ref >60)
Glucose, Bld: 100 mg/dL — ABNORMAL HIGH (ref 70–99)
Potassium: 3.8 mmol/L (ref 3.5–5.1)
Sodium: 140 mmol/L (ref 135–145)

## 2018-03-10 MED ORDER — POLYETHYLENE GLYCOL 3350 17 G PO PACK
17.0000 g | PACK | Freq: Every day | ORAL | Status: DC
  Administered 2018-03-11: 17 g via ORAL
  Filled 2018-03-10: qty 1

## 2018-03-10 MED ORDER — PSYLLIUM 95 % PO PACK
1.0000 | PACK | Freq: Every day | ORAL | Status: DC
  Administered 2018-03-11: 1 via ORAL
  Filled 2018-03-10 (×2): qty 1

## 2018-03-10 NOTE — Progress Notes (Addendum)
Subjective:    Jamie Bennett is a 57 y.o. female  Hospital stay day 3, 3 Days Post-Op ventral hernia repair with mesh  complaing of some heartburn and still discomfort all the way around her torso.  ROS:  A 5 point review of systems was performed and pertinent positives and negatives noted in HPI.   Objective:      Temp:  [97.9 F (36.6 C)-98 F (36.7 C)] 97.9 F (36.6 C) (08/11 1418) Pulse Rate:  [64-67] 64 (08/11 1418) Resp:  [12-16] 12 (08/11 1418) BP: (116-121)/(72-82) 121/82 (08/11 1418) SpO2:  [97 %-99 %] 97 % (08/11 1418)     Height: 5\' 5"  (165.1 cm) Weight: (!) 151.5 kg BMI (Calculated): 55.58   Intake/Output this shift:  No intake/output data recorded.       Constitutional :  alert, cooperative, appears stated age and no distress  Respiratory:  clear to auscultation bilaterally  Cardiovascular:  regular rate and rhythm  Gastrointestinal: soft, non-tender; bowel sounds normal; no masses,  no organomegaly and midline staples intact.  some discharg noted on dressing but none visible underneath even with pressure.  JP with serosanguinous drainage.  some bruising noted in inferior aspect. .   Skin: Cool and moist.   Psychiatric: Normal affect, non-agitated, not confused       LABS:  CMP Latest Ref Rng & Units 03/10/2018 03/09/2018 03/08/2018  Glucose 70 - 99 mg/dL 100(H) 100(H) 103(H)  BUN 6 - 20 mg/dL 7 10 9   Creatinine 0.44 - 1.00 mg/dL 0.81 0.86 0.89  Sodium 135 - 145 mmol/L 140 139 143  Potassium 3.5 - 5.1 mmol/L 3.8 3.4(L) 3.4(L)  Chloride 98 - 111 mmol/L 105 104 108  CO2 22 - 32 mmol/L 27 27 27   Calcium 8.9 - 10.3 mg/dL 8.3(L) 8.2(L) 8.4(L)  Total Protein 6.5 - 8.1 g/dL - - -  Total Bilirubin 0.3 - 1.2 mg/dL - - -  Alkaline Phos 38 - 126 U/L - - -  AST 15 - 41 U/L - - -  ALT 0 - 44 U/L - - -   CBC Latest Ref Rng & Units 03/08/2018 03/07/2018 03/02/2018  WBC 3.6 - 11.0 K/uL 7.4 7.5 6.4  Hemoglobin 12.0 - 16.0 g/dL 11.4(L) 13.7 11.3(L)  Hematocrit 35.0 -  47.0 % 34.0(L) 40.2 33.6(L)  Platelets 150 - 440 K/uL 220 282 188    RADS:  Assessment:  ventral hernia repair with mesh Advance to regular diet. Despite complaint of pain around entire torso, denies any tenderness with palpation and wound looks clean, no sign of infection or fluid collection.  Continue to monitor for now

## 2018-03-11 ENCOUNTER — Inpatient Hospital Stay: Payer: Medicare HMO

## 2018-03-11 LAB — CBC WITH DIFFERENTIAL/PLATELET
Basophils Absolute: 0 10*3/uL (ref 0–0.1)
Basophils Relative: 0 %
Eosinophils Absolute: 0.4 10*3/uL (ref 0–0.7)
Eosinophils Relative: 8 %
HCT: 34.2 % — ABNORMAL LOW (ref 35.0–47.0)
Hemoglobin: 11.8 g/dL — ABNORMAL LOW (ref 12.0–16.0)
Lymphocytes Relative: 24 %
Lymphs Abs: 1 10*3/uL (ref 1.0–3.6)
MCH: 30.9 pg (ref 26.0–34.0)
MCHC: 34.3 g/dL (ref 32.0–36.0)
MCV: 90.1 fL (ref 80.0–100.0)
Monocytes Absolute: 0.4 10*3/uL (ref 0.2–0.9)
Monocytes Relative: 10 %
Neutro Abs: 2.6 10*3/uL (ref 1.4–6.5)
Neutrophils Relative %: 58 %
Platelets: 293 10*3/uL (ref 150–440)
RBC: 3.8 MIL/uL (ref 3.80–5.20)
RDW: 13 % (ref 11.5–14.5)
WBC: 4.4 10*3/uL (ref 3.6–11.0)

## 2018-03-11 MED ORDER — POTASSIUM CHLORIDE 2 MEQ/ML IV SOLN: INTRAVENOUS | Status: DC

## 2018-03-11 MED ORDER — FAMOTIDINE IN NACL 20-0.9 MG/50ML-% IV SOLN
20.0000 mg | Freq: Two times a day (BID) | INTRAVENOUS | Status: DC
  Administered 2018-03-11 – 2018-03-14 (×8): 20 mg via INTRAVENOUS
  Filled 2018-03-11 (×9): qty 50

## 2018-03-11 MED ORDER — BUTAMBEN-TETRACAINE-BENZOCAINE 2-2-14 % EX AERO
3.0000 | INHALATION_SPRAY | Freq: Once | CUTANEOUS | Status: AC
  Administered 2018-03-11: 3 via TOPICAL
  Filled 2018-03-11 (×3): qty 20

## 2018-03-11 MED ORDER — KCL IN DEXTROSE-NACL 10-5-0.45 MEQ/L-%-% IV SOLN
INTRAVENOUS | Status: AC
  Administered 2018-03-11 – 2018-03-14 (×3): via INTRAVENOUS
  Filled 2018-03-11 (×6): qty 1000

## 2018-03-11 NOTE — Progress Notes (Signed)
AVSS. Nausea developed last night after full liquid supper. Vomiting about 6 AM. Reports small BM yesterday. Lungs: Clear. ABD: Obese, soft, mild diffuse tenderness. Wound: Flat, no recurrent bulge as noted prior to ventral hernia repair. Bruising secondary to local anesthetic slowly resolving. Labs: CBC shows stable HGB, normal WBC and diff.  Basic met panel of yesterday showed resolution of mild hypokalemia.  IMP: Post op N/V in patient s/p roux en y gastric bypass in early 2000's. S/p repair of ventral hernia without small bowel compromise last week. Plan: Will start with plain films, but may need SBFT.

## 2018-03-11 NOTE — Progress Notes (Signed)
Ambulated today. Large normal BM. NG about 500 cc, KUB showed a curl. Repositioned then noted high at junction of mid/ lower esophagus. Repositioned: good air bubbling noted on insufflation in the epigastrium with no burping. KUB after first placement showed smaller small bowel loops. Plan: Encourage ambulation in the event this is early adhesive disease. F/U films/ labs in AM. At present, patient and family comfortable with staying here.

## 2018-03-11 NOTE — Progress Notes (Signed)
Much more comfortable than prior to CT with Phenergan and Dilaudid. CT results reviewed w/ patient.  No internal hernia or issue w/ previous roux en y bypass. Bowel adherent tangentially to area of hernia repair w/ decompressed bowel distally. May be adhesions, need to consider possibility of bowel having slipped between transfascial fixation sutures. Options for management reviewed: NG trial vs OR. Fairly benign exam (Soft without focal tenderness) and normal labs this AM make former a consideration, recognizing if no change over 48 hours operative intervention will be required. Should surgery be required, family/ patient needs to decide if they are comfortable here or desire transfer. Will place NG and review options w/ family later today.

## 2018-03-11 NOTE — Progress Notes (Signed)
Plain films show dilated small bowel loops. Will obtain non-contrast CT to assess.

## 2018-03-11 NOTE — Care Management Important Message (Signed)
Copy of signed IM left with patient in room.  

## 2018-03-11 NOTE — Progress Notes (Signed)
Pt had 2 episodes of vomiting once at 7:15 am and another one at 10:51 am alerted Dr. [name].

## 2018-03-12 ENCOUNTER — Inpatient Hospital Stay: Payer: Medicare HMO

## 2018-03-12 LAB — CBC WITH DIFFERENTIAL/PLATELET
Basophils Absolute: 0 10*3/uL (ref 0–0.1)
Basophils Relative: 0 %
Eosinophils Absolute: 0.5 10*3/uL (ref 0–0.7)
Eosinophils Relative: 9 %
HCT: 34.7 % — ABNORMAL LOW (ref 35.0–47.0)
Hemoglobin: 11.3 g/dL — ABNORMAL LOW (ref 12.0–16.0)
Lymphocytes Relative: 35 %
Lymphs Abs: 2 10*3/uL (ref 1.0–3.6)
MCH: 29.5 pg (ref 26.0–34.0)
MCHC: 32.5 g/dL (ref 32.0–36.0)
MCV: 90.7 fL (ref 80.0–100.0)
Monocytes Absolute: 0.6 10*3/uL (ref 0.2–0.9)
Monocytes Relative: 10 %
Neutro Abs: 2.7 10*3/uL (ref 1.4–6.5)
Neutrophils Relative %: 46 %
Platelets: 316 10*3/uL (ref 150–440)
RBC: 3.82 MIL/uL (ref 3.80–5.20)
RDW: 13.2 % (ref 11.5–14.5)
WBC: 5.8 10*3/uL (ref 3.6–11.0)

## 2018-03-12 LAB — BASIC METABOLIC PANEL
Anion gap: 7 (ref 5–15)
BUN: 6 mg/dL (ref 6–20)
CO2: 28 mmol/L (ref 22–32)
Calcium: 8.4 mg/dL — ABNORMAL LOW (ref 8.9–10.3)
Chloride: 110 mmol/L (ref 98–111)
Creatinine, Ser: 0.9 mg/dL (ref 0.44–1.00)
GFR calc Af Amer: >60 mL/min (ref >60)
GFR calc non Af Amer: >60 mL/min (ref >60)
Glucose, Bld: 102 mg/dL — ABNORMAL HIGH (ref 70–99)
Potassium: 3.8 mmol/L (ref 3.5–5.1)
Sodium: 145 mmol/L (ref 135–145)

## 2018-03-12 LAB — AEROBIC/ANAEROBIC CULTURE (SURGICAL/DEEP WOUND): Culture: NO GROWTH

## 2018-03-12 LAB — AEROBIC/ANAEROBIC CULTURE W GRAM STAIN (SURGICAL/DEEP WOUND)

## 2018-03-12 MED ORDER — ACETAMINOPHEN 650 MG RE SUPP
650.0000 mg | Freq: Four times a day (QID) | RECTAL | Status: DC | PRN
  Administered 2018-03-12: 650 mg via RECTAL
  Filled 2018-03-12: qty 1

## 2018-03-12 MED ORDER — DIPHENHYDRAMINE HCL 50 MG/ML IJ SOLN
25.0000 mg | Freq: Once | INTRAMUSCULAR | Status: AC
  Administered 2018-03-12: 25 mg via INTRAVENOUS
  Filled 2018-03-12: qty 1

## 2018-03-12 NOTE — Progress Notes (Signed)
AVSS. Ambulating as requested. Tolerating NG well. BM x 2 per patient report. No wretching/ vomiting w/ tube in. Lungs: Clear. Cardio: RR. ABD: Soft. BS+. Wound: Clean. JP: Volume of serosang fluid decreasing. F/U KUB: Small diameter of SB loops, still some distension. + Colonic air. Labs: All at baseline. U/O: OK. IMP: PSBO, exam and films suggest slow resolution w/ NG and IV fluids. Plan: Continue observation for 24 hours, repeat KUB/ poss SBFT tomorrow.

## 2018-03-12 NOTE — Progress Notes (Signed)
Initial Nutrition Assessment  DOCUMENTATION CODES:   Morbid obesity  INTERVENTION:   RD will monitor for diet advancement vs the need for nutrition support  Consider TPN if unable to advance diet in 1-2 days  NUTRITION DIAGNOSIS:   Inadequate oral intake related to acute illness as evidenced by NPO status.  GOAL:   Patient will meet greater than or equal to 90% of their needs  MONITOR:   Diet advancement, Labs, Weight trends, Skin, I & O's  REASON FOR ASSESSMENT:   NPO/Clear Liquid Diet    ASSESSMENT:   57 y.o. female with SBO attributed to post-surgical incisional hernia, complicated by pertinent comorbidities including morbid obesity (BMI 56) despite history of Roux-en-Y gastric bypass (2003), collagen vascular disease, hypothyroidism, osteoporosis, rheumatoid arthritis, osteoarthritis, GERD, bipolar disorder, generalized anxiety disorder, fibromyalgia, and history of opiod abuse.    -pt s/p umbilicectomy with excision of intra-abdominal nodule 8/2 -pt s/p repair of ventral hernia with ventral light ST mesh 8/8  Met with pt in room today. Pt reports poor appetite and oral intake for 1 week; reports her last solid meal was last Tuesday 8/6. Pt reports eating some grits while on a full liquid diet about 3 days ago. Pt is lactose intolerant and is unable to eat most items on the full liquid tray. Pt is able to tolerate yogurt. Pt is willing to drink Ensure, which is lactose free, whenever it is medically appropriate to do so. Pt NPO with NGT in place; noted 471m output x 24 hrs. Per chart, pt is weight stable. No urine output documented. Pt walked 3 laps around the nurses station today. RD will monitor for diet advancement vs the need for nutrition support. Consider TPN if unable to advance diet in 1-2 days as pt without adequate nutrition for 7 days.   Pt with h/o Roux-en-y gastric bypass surgery in 2003. Pt reports taking a daily MVI, vitamin D3, monthly B12 injections and  iron infusions as needed. Pt does not believe she has ever had her vitamin labs checked that she can recall. Pt at high risk for nutrient deficiencies r/t her gastric surgery. Recommend check thiamine, B12, folate, iron, calcium, zinc, copper, and vitamins D, A, E, & K yearly.   Medications reviewed and include: lovenox, synthroid, NaCl w/ 5% dextrose & KCl '@50ml'$ /hr, pepcid, hydromorphone   Labs reviewed:   NUTRITION - FOCUSED PHYSICAL EXAM:    Most Recent Value  Orbital Region  No depletion  Upper Arm Region  No depletion  Thoracic and Lumbar Region  No depletion  Buccal Region  No depletion  Temple Region  No depletion  Clavicle Bone Region  No depletion  Clavicle and Acromion Bone Region  No depletion  Scapular Bone Region  No depletion  Dorsal Hand  No depletion  Patellar Region  No depletion  Anterior Thigh Region  No depletion  Posterior Calf Region  No depletion  Edema (RD Assessment)  Mild  Hair  Reviewed  Eyes  Reviewed  Mouth  Reviewed  Skin  Reviewed  Nails  Reviewed     Diet Order:   Diet Order    None     EDUCATION NEEDS:   Education needs have been addressed  Skin:  Skin Assessment: Reviewed RN Assessment(ecchymosis, abdominal incision )  Last BM:  8/13  Height:   Ht Readings from Last 1 Encounters:  03/07/18 '5\' 5"'$  (1.651 m)    Weight:   Wt Readings from Last 1 Encounters:  03/07/18 (!) 151.5 kg  Ideal Body Weight:  56.8 kg  BMI:  Body mass index is 55.58 kg/m.  Estimated Nutritional Needs:   Kcal:  2400-2700kcal/day   Protein:  121-150g/day   Fluid:  >2L/day   Koleen Distance MS, RD, LDN Pager #- [phone] Office#- [phone] After Hours Pager: [phone]

## 2018-03-13 ENCOUNTER — Inpatient Hospital Stay: Payer: Medicare HMO

## 2018-03-13 MED ORDER — ACETAMINOPHEN 650 MG RE SUPP: 650.0000 mg | Freq: Four times a day (QID) | RECTAL | Status: DC | PRN

## 2018-03-13 MED ORDER — ACETAMINOPHEN 325 MG PO TABS
650.0000 mg | ORAL_TABLET | Freq: Four times a day (QID) | ORAL | Status: DC | PRN
  Administered 2018-03-14: 650 mg via ORAL
  Filled 2018-03-13: qty 2

## 2018-03-13 NOTE — Progress Notes (Signed)
AVSS. Much more comfortable. SBFT reviewed: Slow transit in proximal SB, but to transverse colon by 6 hours.  ABD: Soft. Wound: Clean. Blake: 40 cc. Plan: D/C NG, po clears, ambulate.

## 2018-03-14 MED ORDER — OXYCODONE-ACETAMINOPHEN 7.5-325 MG PO TABS
1.0000 | ORAL_TABLET | ORAL | Status: DC | PRN
  Administered 2018-03-14 (×2): 1 via ORAL
  Filled 2018-03-14 (×2): qty 1

## 2018-03-14 MED ORDER — ENSURE ENLIVE PO LIQD
237.0000 mL | Freq: Two times a day (BID) | ORAL | Status: DC
  Administered 2018-03-14 – 2018-03-15 (×2): 237 mL via ORAL

## 2018-03-14 MED ORDER — HYDROCODONE-ACETAMINOPHEN 5-325 MG PO TABS
1.0000 | ORAL_TABLET | ORAL | Status: DC | PRN
  Administered 2018-03-14 – 2018-03-15 (×5): 1 via ORAL
  Filled 2018-03-14 (×5): qty 1

## 2018-03-14 MED ORDER — PANTOPRAZOLE SODIUM 40 MG PO TBEC
40.0000 mg | DELAYED_RELEASE_TABLET | Freq: Every day | ORAL | Status: DC
  Administered 2018-03-15: 40 mg via ORAL
  Filled 2018-03-14: qty 1

## 2018-03-14 MED ORDER — ONDANSETRON HCL 4 MG PO TABS
4.0000 mg | ORAL_TABLET | ORAL | Status: DC | PRN
  Administered 2018-03-14 (×3): 4 mg via ORAL
  Filled 2018-03-14 (×3): qty 1

## 2018-03-14 NOTE — Progress Notes (Signed)
AVSS. Pain still above average, but improved. Reports BM+ and passing flatus.  Reports mild distress after po, but not so much that she was not anxious to advance to full liquids. Lungs: Clear. Cardio: RR ABD: Soft. Wound: Clean. JP: 60 cc overnight. After one week, I elected to remove. About 30 cc additional odorless serous fluid after drain removed. Extrem: Soft. Using incentive at 1750. Ambulating well by staff report. Plan: Advance diet. D/C IV after present bag infused. Work on oral meds for pain/ nausea. Continue ambulation.

## 2018-03-14 NOTE — Care Management Important Message (Signed)
Copy of signed IM left with patient and husband in room. 

## 2018-03-15 MED ORDER — OXYCODONE-ACETAMINOPHEN 5-325 MG PO TABS: 1.0000 | ORAL_TABLET | ORAL | 0 refills | Status: DC | PRN

## 2018-03-15 NOTE — Progress Notes (Signed)
Nsg Discharge Note  Admit Date:  03/07/2018 Discharge date: 03/15/2018   [name] to be D/C'd Home per MD order.  AVS completed.  Copy for chart, and copy for patient signed, and dated. Patient/caregiver able to verbalize understanding.  Discharge Medication: Allergies as of 03/15/2018      Reactions   Lactose Intolerance (gi) Other (See Comments)   Bloating and GI distress      Medication List    STOP taking these medications   HYDROcodone-acetaminophen 5-325 MG tablet Commonly known as:  NORCO/VICODIN   Magnesium 400 MG Caps   multivitamin with minerals Tabs tablet     TAKE these medications   aspirin EC 81 MG tablet Take 81 mg by mouth daily.   busPIRone 15 MG tablet Commonly known as:  BUSPAR Take 1 tablet (15 mg total) by mouth 4 (four) times daily. What changed:    how much to take  when to take this   ciclopirox 8 % solution Commonly known as:  PENLAC Apply to affected nail and surrounding nail bed QHS   clonazePAM 0.5 MG tablet Commonly known as:  KLONOPIN Take 1 tablet (0.5 mg total) by mouth 2 (two) times daily. What changed:    when to take this  reasons to take this   Coenzyme Q-10 200 MG Caps Take 200 mg by mouth 2 (two) times daily.   COLACE 2-IN-1 8.6-50 MG tablet Generic drug:  senna-docusate Take 0.5 tablets by mouth daily.   cyanocobalamin 1000 MCG/ML injection Commonly known as:  (VITAMIN B-12) Inject 1,000 mcg into the muscle every 30 (thirty) days.   esomeprazole 20 MG capsule Commonly known as:  NEXIUM Take 20 mg by mouth 2 (two) times daily before a meal.   Fiber (Guar Gum) Chew Chew 5 mg by mouth daily.   HM VITAMIN D3 4000 units Caps Generic drug:  Cholecalciferol Take 4,000 Units by mouth daily.   hydroxychloroquine 200 MG tablet Commonly known as:  PLAQUENIL Take 1 tablet by mouth twice a day Monday through Friday   hydrOXYzine 50 MG tablet Commonly known as:  ATARAX/VISTARIL Take 1 tablet (50 mg total)  by mouth 3 (three) times daily as needed for anxiety.   Krill Oil 500 MG Caps Take 500 mg by mouth daily.   lamoTRIgine 150 MG tablet Commonly known as:  LAMICTAL Take 1 tablet (150 mg total) by mouth 2 (two) times daily.   levothyroxine 112 MCG tablet Commonly known as:  SYNTHROID, LEVOTHROID Take 1 tablet (112 mcg total) by mouth daily. What changed:  when to take this   loratadine 10 MG tablet Commonly known as:  CLARITIN Take 10 mg by mouth daily as needed for allergies.   oxyCODONE-acetaminophen 5-325 MG tablet Commonly known as:  PERCOCET/ROXICET Take 1 tablet by mouth every 4 (four) hours as needed for severe pain.   polyethylene glycol packet Commonly known as:  MIRALAX / GLYCOLAX Take 17 g by mouth daily. What changed:    when to take this  reasons to take this   promethazine 25 MG tablet Commonly known as:  PHENERGAN Take 1 tablet (25 mg total) by mouth every 6 (six) hours as needed for nausea or vomiting.   sucralfate 1 g tablet Commonly known as:  CARAFATE Take 1 tablet (1 g total) by mouth 2 (two) times daily.   tiZANidine 4 MG tablet Commonly known as:  ZANAFLEX Take 1 tablet (4 mg total) by mouth 3 (three) times daily as needed. What changed:  reasons to take this   traZODone 100 MG tablet Commonly known as:  DESYREL Take 2 tablets (200 mg total) by mouth at bedtime. What changed:    how much to take  when to take this  reasons to take this   venlafaxine 100 MG tablet Commonly known as:  EFFEXOR Take 1 tablet (100 mg total) by mouth 3 (three) times daily with meals.   zoledronic acid 5 MG/100ML Soln injection Commonly known as:  RECLAST Inject 5 mg into the vein See admin instructions. Patient takes yearly       Discharge Assessment: Vitals:   03/15/18 0518 03/15/18 1213  BP: 133/76 126/66  Pulse: 70 67  Resp: 18 16  Temp: 98.2 F (36.8 C) 98.1 F (36.7 C)  SpO2: 95% 98%   Skin clean, dry and intact without evidence of skin  break down, no evidence of skin tears noted. IV catheter discontinued intact. Site without signs and symptoms of complications - no redness or edema noted at insertion site, patient denies c/o pain - only slight tenderness at site.  Dressing with slight pressure applied.  D/c Instructions-Education: Discharge instructions given to patient/family with verbalized understanding. D/c education completed with patient/family including follow up instructions, medication list, d/c activities limitations if indicated, with other d/c instructions as indicated by MD - patient able to verbalize understanding, all questions fully answered. Patient instructed to return to ED, call 911, or call MD for any changes in condition.  Patient escorted via [place], and D/C home via private auto.  Eda Keys, RN 03/15/2018 5:09 PM

## 2018-03-15 NOTE — Progress Notes (Signed)
AVSS. Doing well on full liquds. Normal BM today. Loose stool later in the later in the day.  ABD: Soft. Wound: Clean. No further drainage post JP removal. Extrem: Soft. Plan: Discharge home. Reviewed diet restrictions w/ family and patient.

## 2018-03-15 NOTE — Care Management (Signed)
Barrier: Pain control.  No anticipated RNCM needs

## 2018-03-18 ENCOUNTER — Inpatient Hospital Stay: Payer: Medicare HMO | Attending: Hematology and Oncology

## 2018-03-18 ENCOUNTER — Other Ambulatory Visit: Payer: Self-pay

## 2018-03-18 ENCOUNTER — Other Ambulatory Visit: Payer: Self-pay | Admitting: Urgent Care

## 2018-03-18 ENCOUNTER — Other Ambulatory Visit: Payer: Self-pay | Admitting: General Surgery

## 2018-03-18 ENCOUNTER — Ambulatory Visit: Payer: Medicare HMO

## 2018-03-18 VITALS — Temp 97.9°F

## 2018-03-18 DIAGNOSIS — Z79899 Other long term (current) drug therapy: Secondary | ICD-10-CM | POA: Insufficient documentation

## 2018-03-18 DIAGNOSIS — E538 Deficiency of other specified B group vitamins: Secondary | ICD-10-CM

## 2018-03-18 DIAGNOSIS — D509 Iron deficiency anemia, unspecified: Secondary | ICD-10-CM | POA: Insufficient documentation

## 2018-03-18 DIAGNOSIS — Q644 Malformation of urachus: Secondary | ICD-10-CM | POA: Diagnosis not present

## 2018-03-18 MED ORDER — SULFAMETHOXAZOLE-TRIMETHOPRIM 800-160 MG PO TABS: 1.0000 | ORAL_TABLET | Freq: Two times a day (BID) | ORAL | 0 refills | Status: DC

## 2018-03-18 MED ORDER — CYANOCOBALAMIN 1000 MCG/ML IJ SOLN
1000.0000 ug | Freq: Once | INTRAMUSCULAR | Status: AC
  Administered 2018-03-18: 1000 ug via INTRAMUSCULAR

## 2018-03-18 NOTE — Progress Notes (Addendum)
Patient ID: Jamie Bennett, female   DOB: 1960/08/20, 57 y.o.   MRN: [id number] Patient here for incision check for surgery done on 03/07/18. She reports that on Saturday(03/07/16/19) she noticed that the steri strips at the base of her incision were separating and that she had some pus draining from the incision site. She reports no fever or chills but does have increased pain in the incision site. She reports that today the drainage is more pinkish in color and much less yellow.  Incision site checked, noted two small openings along incision line about 1 cm. Small amount of pink tinged drainage noted on dressing. No odor detected from surgery site. 3 lower steri strips removed and a 4x4 dressing applied. Culture obtained.   Spoke with Dr [name] about patient and orders received for Bactrim DS one twice daily for 10 days. Patient to follow up this Thursday(03/21/18).

## 2018-03-19 ENCOUNTER — Ambulatory Visit: Payer: Medicare HMO | Admitting: Physician Assistant

## 2018-03-19 ENCOUNTER — Ambulatory Visit: Payer: Medicare HMO | Admitting: General Surgery

## 2018-03-19 ENCOUNTER — Encounter: Payer: Self-pay | Admitting: Physician Assistant

## 2018-03-19 ENCOUNTER — Other Ambulatory Visit: Payer: Self-pay

## 2018-03-19 VITALS — BP 108/72 | HR 77 | Resp 16 | Ht 65.0 in | Wt 334.0 lb

## 2018-03-19 DIAGNOSIS — M5136 Other intervertebral disc degeneration, lumbar region: Secondary | ICD-10-CM

## 2018-03-19 DIAGNOSIS — Z9151 Personal history of suicidal behavior: Secondary | ICD-10-CM

## 2018-03-19 DIAGNOSIS — M797 Fibromyalgia: Secondary | ICD-10-CM

## 2018-03-19 DIAGNOSIS — M5134 Other intervertebral disc degeneration, thoracic region: Secondary | ICD-10-CM | POA: Diagnosis not present

## 2018-03-19 DIAGNOSIS — R69 Illness, unspecified: Secondary | ICD-10-CM | POA: Diagnosis not present

## 2018-03-19 DIAGNOSIS — M62838 Other muscle spasm: Secondary | ICD-10-CM

## 2018-03-19 DIAGNOSIS — M0579 Rheumatoid arthritis with rheumatoid factor of multiple sites without organ or systems involvement: Secondary | ICD-10-CM

## 2018-03-19 DIAGNOSIS — Z8719 Personal history of other diseases of the digestive system: Secondary | ICD-10-CM

## 2018-03-19 DIAGNOSIS — Z79899 Other long term (current) drug therapy: Secondary | ICD-10-CM

## 2018-03-19 DIAGNOSIS — Z8639 Personal history of other endocrine, nutritional and metabolic disease: Secondary | ICD-10-CM

## 2018-03-19 DIAGNOSIS — M818 Other osteoporosis without current pathological fracture: Secondary | ICD-10-CM

## 2018-03-19 DIAGNOSIS — M5030 Other cervical disc degeneration, unspecified cervical region: Secondary | ICD-10-CM

## 2018-03-19 DIAGNOSIS — Z8659 Personal history of other mental and behavioral disorders: Secondary | ICD-10-CM

## 2018-03-19 DIAGNOSIS — Z862 Personal history of diseases of the blood and blood-forming organs and certain disorders involving the immune mechanism: Secondary | ICD-10-CM

## 2018-03-19 DIAGNOSIS — M170 Bilateral primary osteoarthritis of knee: Secondary | ICD-10-CM

## 2018-03-19 DIAGNOSIS — Z915 Personal history of self-harm: Secondary | ICD-10-CM

## 2018-03-19 DIAGNOSIS — M51369 Other intervertebral disc degeneration, lumbar region without mention of lumbar back pain or lower extremity pain: Secondary | ICD-10-CM

## 2018-03-19 NOTE — Patient Outreach (Signed)
[facility]) Care Management  03/19/2018  Jamie Bennett October 21, 1960 [id number]  EMMI: general discharge red alert Referral date: 03/19/18 Referral reason: wound healing well: no Insurance: [employer] 1  Telephone call to patient regarding EMMI general discharge red alert. HIPAA verified with patient. Explained reason for call.  Patient states her wounds have not been healing well.  Patient states she has a 3 inch incision. Patient states she saw the surgeon on yesterday because he wound was not healing well and still draining. Patient states she was started on an antibiotic.  Patient aware of signs/ symptoms of infection.  Patient states today she is still having problems with the wound and now she has a 1/2 in opening on the bottom and 1/2 in opening on the top of incision. Patient states she has placed a call to the doctors office and is waiting for a return call. Patient states she also has an appointment scheduled with the surgeon for tomorrow.   Patient states she has not had follow up with her primary MD since discharged from the hospital. Patient states she has an appointment scheduled in October for a physical.  RNCM advised patient to notify her primary MD office that she was recently discharged from the hospital. Patient verbalized understanding.  Patient states she has her medications and is taking them as prescribed. Patient reports having transportation to her appointments.  RNCM provided  24 hour nurse advise line contact number to patient.   PLAN; RNCM will follow up with patient within 4 business days.   Quinn Plowman RN,BSN,CCM [facility] Telephonic  [phone]      PLAN:

## 2018-03-19 NOTE — Discharge Summary (Signed)
Physician Discharge Summary  Patient ID: OKLA QAZI MRN: [id number] DOB/AGE: Nov 15, 1960 57 y.o.  Admit date: 03/07/2018 Discharge date: 03/19/2018  Admission Diagnoses: Small bowel obstruction  Discharge Diagnoses:  Active Problems:   SBO (small bowel obstruction) [facility])   Discharged Condition: good  Hospital Course: The patient was admitted in a week of having undergone resection of a trans-umbilical sinus with fascial closure.  CT showed a large volume of small bowel within a small diameter hernia sac.  She was taken to the operating room the morning of admission at which time she underwent reduction of the small bowel and placement of an intraperitoneal ventral light mesh with trans-fascial sutures.  She initially did well and then developed recurrent abdominal distention.  Repeat CT scan suggested small bowel obstruction.  Nasogastric tube was placed and after 48 hours a small bowel follow-through study was obtained showing resolution of the radiologic changes.  Patient was started on clear liquids and advance to a soft diet which she tolerated well.  Drain placed at the time of her ventral hernia repair was removed prior to discharge.  Consults: None  Significant Diagnostic Studies: radiology: see above  Treatments: IV hydration  Discharge Exam: Blood pressure 126/66, pulse 67, temperature 98.1 F (36.7 C), temperature source Oral, resp. rate 16, height 5\' 5"  (1.651 m), weight (!) 151.5 kg, SpO2 98 %. General appearance: alert and cooperative Resp: clear to auscultation bilaterally Cardio: regular rate and rhythm, S1, S2 normal, no murmur, click, rub or gallop GI: soft, non-tender; bowel sounds normal; no masses,  no organomegaly Incision/Wound: Clean  Disposition:   Discharge Instructions    Diet - low sodium heart healthy   Complete by:  As directed    Discharge instructions   Complete by:  As directed    Heating pad to abdomen for comfort.  OK to shower.  Use  spirometer at home frequently.  Soft diet as tolerated. No raw fruits/ vegetables.  Resume Miralax today.  Resume Metamucil on Monday.  Tylenol: For soreness.  Percocet ( 5/325) if needed for pain. Lowest frequency possible.   Move down to Norco (hydrocodone) as soon as possible.   No more than 12 regular strength Tylenol/ Percocet / Norco tablets daily.   Increase activity slowly   Complete by:  As directed      Allergies as of 03/15/2018      Reactions   Lactose Intolerance (gi) Other (See Comments)   Bloating and GI distress      Medication List    STOP taking these medications   HYDROcodone-acetaminophen 5-325 MG tablet Commonly known as:  NORCO/VICODIN   Magnesium 400 MG Caps   multivitamin with minerals Tabs tablet     TAKE these medications   aspirin EC 81 MG tablet Take 81 mg by mouth daily.   busPIRone 15 MG tablet Commonly known as:  BUSPAR Take 1 tablet (15 mg total) by mouth 4 (four) times daily. What changed:    how much to take  when to take this   ciclopirox 8 % solution Commonly known as:  PENLAC Apply to affected nail and surrounding nail bed QHS   clonazePAM 0.5 MG tablet Commonly known as:  KLONOPIN Take 1 tablet (0.5 mg total) by mouth 2 (two) times daily. What changed:    when to take this  reasons to take this   Coenzyme Q-10 200 MG Caps Take 200 mg by mouth 2 (two) times daily.   COLACE 2-IN-1 8.6-50 MG tablet Generic  drug:  senna-docusate Take 0.5 tablets by mouth daily.   cyanocobalamin 1000 MCG/ML injection Commonly known as:  (VITAMIN B-12) Inject 1,000 mcg into the muscle every 30 (thirty) days.   esomeprazole 20 MG capsule Commonly known as:  NEXIUM Take 20 mg by mouth 2 (two) times daily before a meal.   Fiber (Guar Gum) Chew Chew 5 mg by mouth daily.   HM VITAMIN D3 4000 units Caps Generic drug:  Cholecalciferol Take 4,000 Units by mouth daily.   hydroxychloroquine 200 MG tablet Commonly known as:   PLAQUENIL Take 1 tablet by mouth twice a day Monday through Friday   hydrOXYzine 50 MG tablet Commonly known as:  ATARAX/VISTARIL Take 1 tablet (50 mg total) by mouth 3 (three) times daily as needed for anxiety.   Krill Oil 500 MG Caps Take 500 mg by mouth daily.   lamoTRIgine 150 MG tablet Commonly known as:  LAMICTAL Take 1 tablet (150 mg total) by mouth 2 (two) times daily.   levothyroxine 112 MCG tablet Commonly known as:  SYNTHROID, LEVOTHROID Take 1 tablet (112 mcg total) by mouth daily. What changed:  when to take this   loratadine 10 MG tablet Commonly known as:  CLARITIN Take 10 mg by mouth daily as needed for allergies.   oxyCODONE-acetaminophen 5-325 MG tablet Commonly known as:  PERCOCET/ROXICET Take 1 tablet by mouth every 4 (four) hours as needed for severe pain.   polyethylene glycol packet Commonly known as:  MIRALAX / GLYCOLAX Take 17 g by mouth daily. What changed:    when to take this  reasons to take this   promethazine 25 MG tablet Commonly known as:  PHENERGAN Take 1 tablet (25 mg total) by mouth every 6 (six) hours as needed for nausea or vomiting.   sucralfate 1 g tablet Commonly known as:  CARAFATE Take 1 tablet (1 g total) by mouth 2 (two) times daily.   tiZANidine 4 MG tablet Commonly known as:  ZANAFLEX Take 1 tablet (4 mg total) by mouth 3 (three) times daily as needed. What changed:  reasons to take this   traZODone 100 MG tablet Commonly known as:  DESYREL Take 2 tablets (200 mg total) by mouth at bedtime. What changed:    how much to take  when to take this  reasons to take this   venlafaxine 100 MG tablet Commonly known as:  EFFEXOR Take 1 tablet (100 mg total) by mouth 3 (three) times daily with meals.   zoledronic acid 5 MG/100ML Soln injection Commonly known as:  RECLAST Inject 5 mg into the vein See admin instructions. Patient takes yearly        Signed: Robert Bellow 03/19/2018, 8:11 AM

## 2018-03-20 ENCOUNTER — Ambulatory Visit (HOSPITAL_COMMUNITY): Payer: Medicare HMO | Admitting: Psychiatry

## 2018-03-20 ENCOUNTER — Telehealth: Payer: Self-pay | Admitting: General Surgery

## 2018-03-20 ENCOUNTER — Encounter (HOSPITAL_COMMUNITY): Payer: Self-pay | Admitting: Psychiatry

## 2018-03-20 VITALS — BP 134/84 | HR 74 | Ht 65.0 in | Wt 334.0 lb

## 2018-03-20 DIAGNOSIS — Z818 Family history of other mental and behavioral disorders: Secondary | ICD-10-CM | POA: Diagnosis not present

## 2018-03-20 DIAGNOSIS — F4312 Post-traumatic stress disorder, chronic: Secondary | ICD-10-CM | POA: Diagnosis not present

## 2018-03-20 DIAGNOSIS — R69 Illness, unspecified: Secondary | ICD-10-CM | POA: Diagnosis not present

## 2018-03-20 DIAGNOSIS — F603 Borderline personality disorder: Secondary | ICD-10-CM

## 2018-03-20 NOTE — Telephone Encounter (Signed)
Patient is calling and is complaining of having some red spots, maybe 15 total, complains of them itching, redness, patient said it just started over night, patient said it just itches when she touches them. Please call patient and advise.

## 2018-03-20 NOTE — Telephone Encounter (Signed)
She states there are random patches on her leg, face and arms. She is thinking mosquito bites. Bactrim was started yesterday. She will use antihistamine and hydrocortisone cream for the itching, monitor the ares if worsen she will not take any further antibiotics, her appointment is tomorrow with the MD, pt agrees.

## 2018-03-20 NOTE — Progress Notes (Signed)
BH MD/PA/NP OP Progress Note  03/20/2018 3:54 PM Jamie Bennett  MRN:  [id number]  Chief Complaint:  med management follow up  HPI: Jamie Bennett presents for med management.  She had a significant medical setback and was hospitalized because of an abdominal hernia and required surgery for a intestinal torsion.  She was in the hospital for about a week.  She reports that she handled the stress much better than she thought she would.  She has not had any episodes of overdosing or taking medications inappropriately.  She has not had any self-injurious behaviors.  She continues on her routine psychiatric medication regimen as below and shares the good news that she started dialectical behavioral therapy with a therapist in [place].    She will follow-up with Dr. Casimiro Needle in about 1 month for a transition follow-up of her care. As been does present and able to corroborate that there has been some forward progress and no acute safety issues recently, and no overdoses since her last visit.  Visit Diagnosis:    ICD-10-CM   1. Chronic post-traumatic stress disorder (PTSD) F43.12   2. Borderline personality disorder ([place]) F60.3     Past Psychiatric History: See intake H&P for full details. Reviewed, with no updates at this time.  Past Medical History:  Past Medical History:  Diagnosis Date  . Anemia   . Anxiety   . Bipolar 1 disorder ([place])   . Collagen vascular disease ([place])    rhematoid arthritis  . Constipation   . Fibromyalgia   . GERD (gastroesophageal reflux disease)   . Heart murmur    mild-asymptomatic  . Hepatic steatosis   . History of Roux-en-Y gastric bypass   . Hypotension   . Hypothyroidism   . Opioid abuse ([place])   . Osteoarthritis   . Osteoporosis   . PONV (postoperative nausea and vomiting)    nausea only  . Rheumatic fever   . Rheumatoid arthritis ([place])   . Thyroid disease     Past Surgical History:  Procedure Laterality Date  . CHOLECYSTECTOMY   2004  . COLONOSCOPY N/A 07/16/2017   Procedure: COLONOSCOPY;  Surgeon: Manya Silvas, MD;  Location: [facility] ENDOSCOPY;  Service: Endoscopy;  Laterality: N/A;  . EXPLORATORY LAPAROTOMY  2009  . GASTRIC BYPASS  2003   [place] N/A 03/01/2018   Procedure: EXPLORATORY LAPAROTOMY/ UMBILECTOMY;  Surgeon: Robert Bellow, MD;  Location: [facility] ORS;  Service: General;  Laterality: N/A;  . TONSILLECTOMY    . VENTRAL HERNIA REPAIR N/A 03/07/2018   Procedure: HERNIA REPAIR VENTRAL ADULT;  Surgeon: Robert Bellow, MD;  Location: [facility] ORS;  Service: General;  Laterality: N/A;    Family Psychiatric History: See intake H&P for full details. Reviewed, with no updates at this time.   Family History:  Family History  Problem Relation Age of Onset  . Depression Mother   . Dementia Mother   . Heart disease Mother   . Parkinson's disease Father   . Colon cancer Neg Hx     Social History:  Social History   Socioeconomic History  . Marital status: Married    Spouse name: Not on file  . Number of children: Not on file  . Years of education: Not on file  . Highest education level: Not on file  Occupational History  . Not on file  Social Needs  . Financial resource strain: Not on file  . Food insecurity:    Worry:  Not on file    Inability: Not on file  . Transportation needs:    Medical: Not on file    Non-medical: Not on file  Tobacco Use  . Smoking status: Never Smoker  . Smokeless tobacco: Never Used  Substance and Sexual Activity  . Alcohol use: No    Alcohol/week: 0.0 standard drinks  . Drug use: No  . Sexual activity: Never  Lifestyle  . Physical activity:    Days per week: Not on file    Minutes per session: Not on file  . Stress: Not on file  Relationships  . Social connections:    Talks on phone: Not on file    Gets together: Not on file    Attends religious service: Not on file    Active member of club or organization: Not on file    Attends  meetings of clubs or organizations: Not on file    Relationship status: Not on file  Other Topics Concern  . Not on file  Social History Narrative  . Not on file    Allergies:  Allergies  Allergen Reactions  . Lactose Intolerance (Gi) Other (See Comments)    Bloating and GI distress    Metabolic Disorder Labs: No results found for: HGBA1C, MPG No results found for: PROLACTIN Lab Results  Component Value Date   TRIG 176 (H) 11/09/2015   Lab Results  Component Value Date   TSH 0.147 (L) 11/11/2015   TSH 0.354 (L) 01/28/2014    Therapeutic Level Labs: No results found for: LITHIUM No results found for: VALPROATE No components found for:  CBMZ  Current Medications: Current Outpatient Medications  Medication Sig Dispense Refill  . aspirin EC 81 MG tablet Take 81 mg by mouth daily.    . busPIRone (BUSPAR) 15 MG tablet Take 1 tablet (15 mg total) by mouth 4 (four) times daily. (Patient taking differently: Take 30 mg by mouth 2 (two) times daily. ) 360 tablet 0  . Cholecalciferol (HM VITAMIN D3) 4000 units CAPS Take 4,000 Units by mouth daily.     . ciclopirox (PENLAC) 8 % solution Apply to affected nail and surrounding nail bed QHS 6.6 mL 2  . clonazePAM (KLONOPIN) 0.5 MG tablet Take 1 tablet (0.5 mg total) by mouth 2 (two) times daily. (Patient taking differently: Take 0.5 mg by mouth 2 (two) times daily as needed for anxiety. ) 60 tablet 2  . Coenzyme Q-10 200 MG CAPS Take 200 mg by mouth 2 (two) times daily.     . cyanocobalamin (,VITAMIN B-12,) 1000 MCG/ML injection Inject 1,000 mcg into the muscle every 30 (thirty) days.    Marland Kitchen esomeprazole (NEXIUM) 20 MG capsule Take 20 mg by mouth 2 (two) times daily before a meal.    . Fiber, Guar Gum, CHEW Chew 5 mg by mouth daily.     . hydroxychloroquine (PLAQUENIL) 200 MG tablet Take 1 tablet by mouth twice a day Monday through Friday 120 tablet 0  . hydrOXYzine (ATARAX/VISTARIL) 50 MG tablet Take 1 tablet (50 mg total) by mouth 3  (three) times daily as needed for anxiety. 270 tablet 1  . Krill Oil 500 MG CAPS Take 500 mg by mouth daily.    Marland Kitchen lamoTRIgine (LAMICTAL) 150 MG tablet Take 1 tablet (150 mg total) by mouth 2 (two) times daily. 180 tablet 1  . levothyroxine (SYNTHROID, LEVOTHROID) 112 MCG tablet Take 1 tablet (112 mcg total) by mouth daily. (Patient taking differently: Take 112 mcg by mouth  daily before breakfast. ) 90 tablet 1  . loratadine (CLARITIN) 10 MG tablet Take 10 mg by mouth daily as needed for allergies.    Marland Kitchen oxyCODONE-acetaminophen (PERCOCET) 5-325 MG tablet Take 1 tablet by mouth every 4 (four) hours as needed for severe pain. 30 tablet 0  . polyethylene glycol (MIRALAX) packet Take 17 g by mouth daily. 14 each 0  . sucralfate (CARAFATE) 1 g tablet Take 1 tablet (1 g total) by mouth 2 (two) times daily. 120 tablet 1  . sulfamethoxazole-trimethoprim (BACTRIM DS,SEPTRA DS) 800-160 MG tablet Take 1 tablet by mouth 2 (two) times daily. 20 tablet 0  . tiZANidine (ZANAFLEX) 4 MG tablet Take 1 tablet (4 mg total) by mouth 3 (three) times daily as needed. (Patient taking differently: Take 4 mg by mouth 3 (three) times daily as needed for muscle spasms. ) 90 tablet 1  . traZODone (DESYREL) 100 MG tablet Take 2 tablets (200 mg total) by mouth at bedtime. (Patient taking differently: Take 100 mg by mouth at bedtime as needed and may repeat dose one time if needed for sleep. ) 180 tablet 1  . venlafaxine (EFFEXOR) 100 MG tablet Take 1 tablet (100 mg total) by mouth 3 (three) times daily with meals. 270 tablet 1  . zoledronic acid (RECLAST) 5 MG/100ML SOLN injection Inject 5 mg into the vein See admin instructions. Patient takes yearly     No current facility-administered medications for this visit.     Musculoskeletal: Strength & Muscle Tone: within normal limits Gait & Station: normal Patient leans: N/A  Psychiatric Specialty Exam: ROS  Blood pressure 134/84, pulse 74, height 5\' 5"  (1.651 m), weight (!) 334  lb (151.5 kg).Body mass index is 55.58 kg/m.  General Appearance: Casual and Fairly Groomed  Eye Contact:  Fair  Speech:  Clear and Coherent and Normal Rate  Volume:  Normal  Mood:  Anxious and Dysphoric  Affect:  Congruent  Thought Process:  Goal Directed and Descriptions of Associations: Intact  Orientation:  Full (Time, Place, and Person)  Thought Content: Logical   Suicidal Thoughts:  No  Homicidal Thoughts:  No  Memory:  Immediate;   Fair  Judgement:  Poor  Insight:  Shallow  Psychomotor Activity:  Normal  Concentration:  Concentration: Fair  Recall:  [employer] of Knowledge: Fair  Language: Fair  Akathisia:  Negative  Handed:  Right  AIMS (if indicated): not done  Assets:  Communication Skills Desire for Improvement Financial Resources/Insurance Housing  ADL's:  Intact  Cognition: WNL  Sleep:  Good   Screenings: AUDIT     Admission (Discharged) from 02/26/2014 in [place] [facility]  Alcohol Use Disorder Identification Test Final Score (AUDIT)  0    ECT-MADRS     ECT Treatment from 06/08/2017 in [place] ECT Treatment from 05/30/2017 in [place] ECT Treatment from 05/23/2017 in [place]  MADRS Total Score  20  22  38    GAD-7     Counselor from 03/08/2017 in [place] Counselor from 02/26/2017 in [facility] [place]  Total GAD-7 Score  7  8    Mini-Mental     ECT Treatment from 06/08/2017 in [place] ECT Treatment from 05/30/2017 in [place] ECT Treatment from 05/23/2017 in [place]  Total Score (max 30 points )  30  30  30     PHQ2-9     Office Visit from 02/15/2018 in [facility] Office Visit from 01/03/2018 in [facility], [facility]  Office Visit from 11/13/2017 in [facility], [facility] Office Visit from 08/07/2017 in [facility], [facility] Counselor from 03/08/2017 in [place]  PHQ-2 Total Score  0  4  6  2  5   PHQ-9 Total Score  0  14  18  9  16        Assessment and Plan: NAVA SONG is a 57 year old female with severe borderline personality disorder and major depressive disorder.  Continues to engage in impulsive unsafe behaviors, including taking too much of medications.  This has prompted an even higher level of oversight and monitoring by her husband, doling out medications on a day-to-day basis for her.  She does not have any suicidal thoughts and does not intend to harm herself, but continues to seek any medication that she thinks may help stimulate her energy levels.  She continues to be quite fixated on Provigil as something that would help her with her energy and motivation, but she has a history of significant overdose on Provigil necessitating ICU admission.  Patient continues to be incredibly irresponsible and high risk with her medications, and I once again redirected her back to her goals of establishing better safety habits, expressing herself more fluidly in interpersonal relationships, and reducing self-harm behaviors.  She does not have any acute suicidality, and agrees to participate in individual and DBT therapies.  Presents today for routine follow-up and overall is doing well, handling some medical stressors without any acute concerns.  No overdoses or unsafe behaviors since her last visit.  1. Chronic post-traumatic stress disorder (PTSD)   2. Borderline personality disorder (HCC)     Status of current problems: unchanged  Labs Ordered: No orders of the defined types were placed in this encounter.   Labs Reviewed: NA  Collateral Obtained/Records Reviewed: Husband is present and able to corroborate no acute safety concerns at this  time  Plan:  Recommend husband have complete control of her medications, as they are currently doing Continue current doses of Effexor, trazodone, Lamictal, Vistaril, and BuSpar Clonazepam available for emergency anxiety as a fire extinguisher if needed Patient has established DBT therapy and [facility] and will continue weekly Patient will transition to new provider at this clinic after writer's departure in August   Aundra Dubin, MD 03/20/2018, 3:54 PM

## 2018-03-21 ENCOUNTER — Ambulatory Visit (INDEPENDENT_AMBULATORY_CARE_PROVIDER_SITE_OTHER): Payer: Medicare HMO | Admitting: General Surgery

## 2018-03-21 ENCOUNTER — Encounter: Payer: Self-pay | Admitting: General Surgery

## 2018-03-21 ENCOUNTER — Other Ambulatory Visit: Payer: Self-pay

## 2018-03-21 VITALS — BP 128/74 | HR 68 | Temp 97.9°F | Resp 18 | Ht 65.0 in | Wt 330.0 lb

## 2018-03-21 DIAGNOSIS — Q644 Malformation of urachus: Secondary | ICD-10-CM

## 2018-03-21 DIAGNOSIS — F603 Borderline personality disorder: Secondary | ICD-10-CM | POA: Diagnosis not present

## 2018-03-21 DIAGNOSIS — K436 Other and unspecified ventral hernia with obstruction, without gangrene: Secondary | ICD-10-CM

## 2018-03-21 DIAGNOSIS — F4010 Social phobia, unspecified: Secondary | ICD-10-CM | POA: Diagnosis not present

## 2018-03-21 DIAGNOSIS — R69 Illness, unspecified: Secondary | ICD-10-CM | POA: Diagnosis not present

## 2018-03-21 NOTE — Patient Outreach (Signed)
[facility]) Care Management  03/21/2018  Jamie Bennett November 17, 1960 [id number]  EMMI: general discharge red alert Referral date: 03/19/18 Referral reason: wound healing well: no Insurance: [employer] Day # 1 Attempt #1   Telephone call to patient regarding EMMI general discharge follow up. Unable to reach patient. HIPAA compliant voice message left with call back phone number.   PLAN: RNCM will attempt 2nd telephone call to patient within 4 business days. RNCM will send outreach letter.   Quinn Plowman RN,BSN,CCM [facility] Telephonic  [phone]

## 2018-03-21 NOTE — Progress Notes (Signed)
Patient ID: Jamie Bennett, female   DOB: 03/07/1961, 57 y.o.   MRN: [id number]  Chief Complaint  Patient presents with  . Routine Post Op    HPI Jamie Bennett is a 57 y.o. female here today for her post op ventral hernia repair done on 03/07/2018.  The patient developed a small bowel obstruction secondary to fascial dehiscence from her original umbilical sinus excision.  She underwent operative repair with placement of an intraperitoneal mesh.  She developed a recurrent transient small bowel obstruction treated with nasogastric decompression with subsequent resolution on small bowel follow-through study.  There is been mild separation of the most inferior aspect of the wound at the time of staple removal on discharge, August 16.  Patient states she doing "pretty good". Appetite is good. Bowels moved yesterday. No nausea or vomiting. The patient had called 24 hours after beginning Bactrim therapy describing small bite-like areas with a patch of erythema which resolved with the use of antihistamines.  No respiratory symptoms. She states the random patches are gone, pother than the ones she scratched. She is here with her husband, Jamie Bennett.  HPI  Past Medical History:  Diagnosis Date  . Anemia   . Anxiety   . Bipolar 1 disorder ([place])   . Collagen vascular disease ([place])    rhematoid arthritis  . Constipation   . Fibromyalgia   . GERD (gastroesophageal reflux disease)   . Heart murmur    mild-asymptomatic  . Hepatic steatosis   . History of Roux-en-Y gastric bypass   . Hypotension   . Hypothyroidism   . Opioid abuse ([place])   . Osteoarthritis   . Osteoporosis   . PONV (postoperative nausea and vomiting)    nausea only  . Rheumatic fever   . Rheumatoid arthritis ([place])   . Thyroid disease     Past Surgical History:  Procedure Laterality Date  . CHOLECYSTECTOMY  2004  . COLONOSCOPY N/A 07/16/2017   Procedure: COLONOSCOPY;  Surgeon: Manya Silvas, MD;  Location: [facility]  ENDOSCOPY;  Service: Endoscopy;  Laterality: N/A;  . EXPLORATORY LAPAROTOMY  2009  . GASTRIC BYPASS  2003   [place] N/A 03/01/2018   Procedure: EXPLORATORY LAPAROTOMY/ UMBILECTOMY;  Surgeon: Robert Bellow, MD;  Location: [facility] ORS;  Service: General;  Laterality: N/A;  . TONSILLECTOMY    . VENTRAL HERNIA REPAIR N/A 03/07/2018   Procedure: HERNIA REPAIR VENTRAL ADULT;  Surgeon: Robert Bellow, MD;  Location: [facility] ORS;  Service: General;  Laterality: N/A;    Family History  Problem Relation Age of Onset  . Depression Mother   . Dementia Mother   . Heart disease Mother   . Parkinson's disease Father   . Colon cancer Neg Hx     Social History Social History   Tobacco Use  . Smoking status: Never Smoker  . Smokeless tobacco: Never Used  Substance Use Topics  . Alcohol use: No    Alcohol/week: 0.0 standard drinks  . Drug use: No    Allergies  Allergen Reactions  . Lactose Intolerance (Gi) Other (See Comments)    Bloating and GI distress    Current Outpatient Medications  Medication Sig Dispense Refill  . aspirin EC 81 MG tablet Take 81 mg by mouth daily.    . busPIRone (BUSPAR) 15 MG tablet Take 1 tablet (15 mg total) by mouth 4 (four) times daily. (Patient taking differently: Take 30 mg by mouth 2 (two) times daily. ) 360 tablet 0  .  Cholecalciferol (HM VITAMIN D3) 4000 units CAPS Take 4,000 Units by mouth daily.     . ciclopirox (PENLAC) 8 % solution Apply to affected nail and surrounding nail bed QHS 6.6 mL 2  . clonazePAM (KLONOPIN) 0.5 MG tablet Take 1 tablet (0.5 mg total) by mouth 2 (two) times daily. (Patient taking differently: Take 0.5 mg by mouth 2 (two) times daily as needed for anxiety. ) 60 tablet 2  . Coenzyme Q-10 200 MG CAPS Take 200 mg by mouth 2 (two) times daily.     . cyanocobalamin (,VITAMIN B-12,) 1000 MCG/ML injection Inject 1,000 mcg into the muscle every 30 (thirty) days.    Marland Kitchen esomeprazole (NEXIUM) 20 MG capsule Take 20 mg  by mouth 2 (two) times daily before a meal.    . Fiber, Guar Gum, CHEW Chew 5 mg by mouth daily.     . hydroxychloroquine (PLAQUENIL) 200 MG tablet Take 1 tablet by mouth twice a day Monday through Friday 120 tablet 0  . hydrOXYzine (ATARAX/VISTARIL) 50 MG tablet Take 1 tablet (50 mg total) by mouth 3 (three) times daily as needed for anxiety. 270 tablet 1  . Krill Oil 500 MG CAPS Take 500 mg by mouth daily.    Marland Kitchen lamoTRIgine (LAMICTAL) 150 MG tablet Take 1 tablet (150 mg total) by mouth 2 (two) times daily. 180 tablet 1  . levothyroxine (SYNTHROID, LEVOTHROID) 112 MCG tablet Take 1 tablet (112 mcg total) by mouth daily. (Patient taking differently: Take 112 mcg by mouth daily before breakfast. ) 90 tablet 1  . loratadine (CLARITIN) 10 MG tablet Take 10 mg by mouth daily as needed for allergies.    Marland Kitchen oxyCODONE-acetaminophen (PERCOCET) 5-325 MG tablet Take 1 tablet by mouth every 4 (four) hours as needed for severe pain. 30 tablet 0  . polyethylene glycol (MIRALAX) packet Take 17 g by mouth daily. 14 each 0  . sucralfate (CARAFATE) 1 g tablet Take 1 tablet (1 g total) by mouth 2 (two) times daily. 120 tablet 1  . sulfamethoxazole-trimethoprim (BACTRIM DS,SEPTRA DS) 800-160 MG tablet Take 1 tablet by mouth 2 (two) times daily. 20 tablet 0  . tiZANidine (ZANAFLEX) 4 MG tablet Take 1 tablet (4 mg total) by mouth 3 (three) times daily as needed. (Patient taking differently: Take 4 mg by mouth 3 (three) times daily as needed for muscle spasms. ) 90 tablet 1  . traZODone (DESYREL) 100 MG tablet Take 2 tablets (200 mg total) by mouth at bedtime. (Patient taking differently: Take 100 mg by mouth at bedtime as needed and may repeat dose one time if needed for sleep. ) 180 tablet 1  . venlafaxine (EFFEXOR) 100 MG tablet Take 1 tablet (100 mg total) by mouth 3 (three) times daily with meals. 270 tablet 1  . zoledronic acid (RECLAST) 5 MG/100ML SOLN injection Inject 5 mg into the vein See admin instructions.  Patient takes yearly     No current facility-administered medications for this visit.     Review of Systems Review of Systems  Constitutional: Negative.   Respiratory: Negative.   Cardiovascular: Negative.   Gastrointestinal: Negative for diarrhea, nausea and vomiting.    Blood pressure 128/74, pulse 68, temperature 97.9 F (36.6 C), temperature source Oral, resp. rate 18, height 5\' 5"  (1.651 m), weight (!) 330 lb (149.7 kg), SpO2 97 %.  Physical Exam Physical Exam  Constitutional: She is oriented to person, place, and time. She appears well-developed and well-nourished.  Cardiovascular: Normal rate, regular rhythm and normal  heart sounds.  Pulmonary/Chest: Effort normal and breath sounds normal.  Abdominal: Soft. Normal appearance and bowel sounds are normal.    Lower incision open  Neurological: She is alert and oriented to person, place, and time.  Skin: Skin is warm and dry.  Psychiatric: Her behavior is normal.    Data Reviewed Anaerobic Culture WILL FOLLOW P  Aerobic Culture Preliminary report P  Result 1 Mixed skin flora P     Assessment    Doing well post mesh repair of ventral hernia, excision of umbilical sinus.  Preliminary culture showing only skin flora, in light of 7 cm of adipose tissue and Blake drain volume after hernia repair we will continue antibiotics.    Plan  Follow up in 2 weeks. Complete antibiotics. May shower.  HPI, Physical Exam, Assessment and Plan have been scribed under the direction and in the presence of Robert Bellow, MD. Karie Fetch, RN  I have completed the exam and reviewed the above documentation for accuracy and completeness.  I agree with the above.  Haematologist has been used and any errors in dictation or transcription are unintentional.  Hervey Ard, M.D., F.A.C.S.   Forest Gleason Caylen Kuwahara 03/22/2018, 6:54 AM

## 2018-03-21 NOTE — Patient Instructions (Addendum)
The patient is aware to call back for any questions or new concerns. No driving until comfortable and confident with steering wheel and car. Complete antibiotics. May shower

## 2018-03-22 DIAGNOSIS — K436 Other and unspecified ventral hernia with obstruction, without gangrene: Secondary | ICD-10-CM | POA: Insufficient documentation

## 2018-03-22 LAB — ANAEROBIC AND AEROBIC CULTURE

## 2018-03-25 ENCOUNTER — Other Ambulatory Visit: Payer: Self-pay | Admitting: Rheumatology

## 2018-03-25 ENCOUNTER — Ambulatory Visit: Payer: Self-pay

## 2018-03-25 NOTE — Telephone Encounter (Signed)
Last visit: 03/19/18 Next visit: 08/20/17 Labs: 03/10/18 Stable PLQ eye exam: 03/2017.   Okay to refill per Dr. Estanislado Pandy

## 2018-03-26 ENCOUNTER — Other Ambulatory Visit (HOSPITAL_COMMUNITY): Payer: Self-pay

## 2018-03-26 ENCOUNTER — Other Ambulatory Visit: Payer: Self-pay

## 2018-03-26 DIAGNOSIS — F603 Borderline personality disorder: Secondary | ICD-10-CM

## 2018-03-26 DIAGNOSIS — F332 Major depressive disorder, recurrent severe without psychotic features: Secondary | ICD-10-CM

## 2018-03-26 DIAGNOSIS — F411 Generalized anxiety disorder: Secondary | ICD-10-CM

## 2018-03-26 MED ORDER — VENLAFAXINE HCL 100 MG PO TABS: 100.0000 mg | ORAL_TABLET | Freq: Three times a day (TID) | ORAL | 0 refills | Status: DC

## 2018-03-26 NOTE — Patient Outreach (Signed)
[facility]) Care Management  03/26/2018  DEANNA WIATER [birth year] [id number]  EMMI:general discharge red alert Referral date:03/19/18 Referral reason:wound healing well: no Insurance: [employer] Day #1  Telephone call to patient regarding EMMI general discharge red alert. HIPAA verified by patient.  Explained reason for call. Patient states she saw her doctor regarding the wound concerns. Patient states her doctor said the wound opening a small amount was normal. She states the doctor felt the wound was healing from the inside out.  She states she was advised to keep the area covered with a bandage. Patient states she is doing this. Patient reports the wound is approximately 3 inches long and states the open area is about 1/2 to 5/8 in.  Patient states the open area of the wound still looks a little raw and has some seepage.  Patient states she is scheduled to follow up with the doctor in 1 1/2.  She states she has 2 days left of her antibiotic.  RNCM advised patient to fully complete her antibiotic medication.   RNCM discussed signs/ symptoms of infection. Advised to call doctor for these symptoms. Patient verbalized understanding.  RNCM confirmed patient has contact phone number or 24 hour nurse advise line.   PLAn: RNCM will close patient due to patient being assessed and having no further needs.  RNCM will send patients primary MD closure notification.   Quinn Plowman RN,BSN,CCM [facility] Telephonic  [phone]

## 2018-03-27 DIAGNOSIS — K430 Incisional hernia with obstruction, without gangrene: Secondary | ICD-10-CM

## 2018-03-28 ENCOUNTER — Telehealth: Payer: Self-pay | Admitting: *Deleted

## 2018-03-28 ENCOUNTER — Other Ambulatory Visit: Payer: Self-pay | Admitting: General Surgery

## 2018-03-28 MED ORDER — HYDROCODONE-ACETAMINOPHEN 5-325 MG PO TABS: 1.0000 | ORAL_TABLET | Freq: Four times a day (QID) | ORAL | 0 refills | Status: DC | PRN

## 2018-03-28 NOTE — Telephone Encounter (Signed)
Patient called the office stating that she had an exploratory laparotomy and hernia repair on 03-07-18 with Dr. [name].  The patient would like a refill on pain medication. She was given hydrocodone 5-325 mg and states she has enough pills for 1 1/2 days left.   Patient has a follow up appointment scheduled for next Tuesday, 04-02-18.    The patient also has rheumatoid arthritis and states she cannot get a cortisone shot at this time.   Best number to reach patient is [phone].   Patient uses [facility] on [address].

## 2018-03-28 NOTE — Progress Notes (Signed)
Patient requested refill on pain meds. Post op ventral hernia for recurrent hernia. Will refill x 1.

## 2018-03-28 NOTE — Telephone Encounter (Signed)
She states she is using the [place] for abdominal pain and soreness. She states with the bottom part being open she is having a harder time getting around some. She states the arthritis issue is that she cant get a cortisone injection with an open wound.

## 2018-03-29 ENCOUNTER — Other Ambulatory Visit: Payer: Self-pay | Admitting: General Surgery

## 2018-03-29 ENCOUNTER — Other Ambulatory Visit (HOSPITAL_COMMUNITY): Payer: Self-pay | Admitting: Psychiatry

## 2018-03-29 DIAGNOSIS — F411 Generalized anxiety disorder: Secondary | ICD-10-CM

## 2018-03-29 MED ORDER — HYDROCODONE-ACETAMINOPHEN 5-325 MG PO TABS: 1.0000 | ORAL_TABLET | Freq: Four times a day (QID) | ORAL | 0 refills | Status: DC | PRN

## 2018-03-29 NOTE — Telephone Encounter (Signed)
Patient called asking about medication refill for Norco. It appears that the prescription went to a mail order pharmacy and they will not fill the prescription. The refill will need to go to [facility] on [address]. The pharmacy has been corrected in epic.

## 2018-04-02 ENCOUNTER — Ambulatory Visit: Payer: Self-pay

## 2018-04-02 ENCOUNTER — Ambulatory Visit (INDEPENDENT_AMBULATORY_CARE_PROVIDER_SITE_OTHER): Payer: Medicare HMO | Admitting: General Surgery

## 2018-04-02 ENCOUNTER — Encounter: Payer: Self-pay | Admitting: General Surgery

## 2018-04-02 VITALS — BP 128/74 | HR 78 | Resp 18 | Ht 64.0 in | Wt 317.0 lb

## 2018-04-02 DIAGNOSIS — F603 Borderline personality disorder: Secondary | ICD-10-CM | POA: Diagnosis not present

## 2018-04-02 DIAGNOSIS — F4010 Social phobia, unspecified: Secondary | ICD-10-CM | POA: Diagnosis not present

## 2018-04-02 DIAGNOSIS — R69 Illness, unspecified: Secondary | ICD-10-CM | POA: Diagnosis not present

## 2018-04-02 DIAGNOSIS — Q644 Malformation of urachus: Secondary | ICD-10-CM

## 2018-04-02 MED ORDER — HYDROCODONE-ACETAMINOPHEN 5-325 MG PO TABS: 1.0000 | ORAL_TABLET | Freq: Three times a day (TID) | ORAL | 0 refills | Status: AC | PRN

## 2018-04-02 NOTE — Patient Instructions (Addendum)
Return in 2 weeks The patient is aware to call back for any questions or concerns. [employer]

## 2018-04-02 NOTE — Progress Notes (Signed)
Patient ID: Jamie Bennett, female   DOB: 21-Jun-1961, 57 y.o.   MRN: [id number]  Chief Complaint  Patient presents with  . Routine Post Op    HPI Jamie Bennett is a 57 y.o. female here today for her post op hernia repair done on 03/07/2018. Patient states she is still having some pain. Moving her bowels daily.  Diet is well-tolerated.  She and her husband had some concern as they felt that the open area on the lower half of the incision was widening.  She had been denied a intra-articular knee injection for her chronic arthritis by her orthopedist due to the open wound.  No contraindication based on today's exam.  Still with some stinging pains intermittently around the surgical site.    Past Medical History:  Diagnosis Date  . Anemia   . Anxiety   . Bipolar 1 disorder ([place])   . Collagen vascular disease ([place])    rhematoid arthritis  . Constipation   . Fibromyalgia   . GERD (gastroesophageal reflux disease)   . Heart murmur    mild-asymptomatic  . Hepatic steatosis   . History of Roux-en-Y gastric bypass   . Hypotension   . Hypothyroidism   . Opioid abuse ([place])   . Osteoarthritis   . Osteoporosis   . PONV (postoperative nausea and vomiting)    nausea only  . Rheumatic fever   . Rheumatoid arthritis ([place])   . Thyroid disease     Past Surgical History:  Procedure Laterality Date  . CHOLECYSTECTOMY  2004  . COLONOSCOPY N/A 07/16/2017   Procedure: COLONOSCOPY;  Surgeon: Manya Silvas, MD;  Location: [facility] ENDOSCOPY;  Service: Endoscopy;  Laterality: N/A;  . EXPLORATORY LAPAROTOMY  2009  . GASTRIC BYPASS  2003   [place] N/A 03/01/2018   Procedure: EXPLORATORY LAPAROTOMY/ UMBILECTOMY;  Surgeon: Robert Bellow, MD;  Location: [facility] ORS;  Service: General;  Laterality: N/A;  . TONSILLECTOMY    . VENTRAL HERNIA REPAIR N/A 03/07/2018   Procedure: HERNIA REPAIR VENTRAL ADULT;  Surgeon: Robert Bellow, MD;  Location: [facility] ORS;  Service:  General;  Laterality: N/A;    Family History  Problem Relation Age of Onset  . Depression Mother   . Dementia Mother   . Heart disease Mother   . Parkinson's disease Father   . Colon cancer Neg Hx     Social History Social History   Tobacco Use  . Smoking status: Never Smoker  . Smokeless tobacco: Never Used  Substance Use Topics  . Alcohol use: No    Alcohol/week: 0.0 standard drinks  . Drug use: No    Allergies  Allergen Reactions  . Lactose Intolerance (Gi) Other (See Comments)    Bloating and GI distress    Current Outpatient Medications  Medication Sig Dispense Refill  . aspirin EC 81 MG tablet Take 81 mg by mouth daily.    . busPIRone (BUSPAR) 15 MG tablet Take 1 tablet (15 mg total) by mouth 4 (four) times daily. (Patient taking differently: Take 30 mg by mouth 2 (two) times daily. ) 360 tablet 0  . Cholecalciferol (HM VITAMIN D3) 4000 units CAPS Take 4,000 Units by mouth daily.     . ciclopirox (PENLAC) 8 % solution Apply to affected nail and surrounding nail bed QHS 6.6 mL 2  . clonazePAM (KLONOPIN) 0.5 MG tablet For acute anxiety, emergencies only 30 tablet 0  . Coenzyme Q-10 200 MG CAPS Take 200 mg by  mouth 2 (two) times daily.     . cyanocobalamin (,VITAMIN B-12,) 1000 MCG/ML injection Inject 1,000 mcg into the muscle every 30 (thirty) days.    Marland Kitchen esomeprazole (NEXIUM) 20 MG capsule Take 20 mg by mouth 2 (two) times daily before a meal.    . Fiber, Guar Gum, CHEW Chew 5 mg by mouth daily.     Marland Kitchen HYDROcodone-acetaminophen (NORCO) 5-325 MG tablet Take 1 tablet by mouth every 6 (six) hours as needed for moderate pain. 20 tablet 0  . hydroxychloroquine (PLAQUENIL) 200 MG tablet Take 1 tablet by mouth twice a day Monday through Friday 120 tablet 0  . hydrOXYzine (ATARAX/VISTARIL) 50 MG tablet Take 1 tablet (50 mg total) by mouth 3 (three) times daily as needed for anxiety. 270 tablet 1  . ibuprofen (ADVIL,MOTRIN) 100 MG tablet Take 100 mg by mouth as needed for fever.     Javier Docker Oil 500 MG CAPS Take 500 mg by mouth daily.    Marland Kitchen lamoTRIgine (LAMICTAL) 150 MG tablet Take 1 tablet (150 mg total) by mouth 2 (two) times daily. 180 tablet 1  . levothyroxine (SYNTHROID, LEVOTHROID) 112 MCG tablet Take 1 tablet (112 mcg total) by mouth daily. (Patient taking differently: Take 112 mcg by mouth daily before breakfast. ) 90 tablet 1  . loratadine (CLARITIN) 10 MG tablet Take 10 mg by mouth daily as needed for allergies.    . polyethylene glycol (MIRALAX) packet Take 17 g by mouth daily. 14 each 0  . sucralfate (CARAFATE) 1 g tablet Take 1 tablet (1 g total) by mouth 2 (two) times daily. 120 tablet 1  . tiZANidine (ZANAFLEX) 4 MG tablet Take 1 tablet (4 mg total) by mouth 3 (three) times daily as needed. (Patient taking differently: Take 4 mg by mouth 3 (three) times daily as needed for muscle spasms. ) 90 tablet 1  . traZODone (DESYREL) 100 MG tablet Take 2 tablets (200 mg total) by mouth at bedtime. (Patient taking differently: Take 100 mg by mouth at bedtime as needed and may repeat dose one time if needed for sleep. ) 180 tablet 1  . venlafaxine (EFFEXOR) 100 MG tablet Take 1 tablet (100 mg total) by mouth 3 (three) times daily with meals. 90 tablet 0  . zoledronic acid (RECLAST) 5 MG/100ML SOLN injection Inject 5 mg into the vein See admin instructions. Patient takes yearly    . HYDROcodone-acetaminophen (NORCO/VICODIN) 5-325 MG tablet Take 1 tablet by mouth every 8 (eight) hours as needed for up to 10 days for moderate pain. 30 tablet 0   No current facility-administered medications for this visit.     Review of Systems Review of Systems  Constitutional: Negative.   Respiratory: Negative.   Cardiovascular: Negative.     Blood pressure 128/74, pulse 78, resp. rate 18, height 5\' 4"  (1.626 m), weight (!) 317 lb (143.8 kg).  Physical Exam Physical Exam  Constitutional: She is oriented to person, place, and time. She appears well-developed and well-nourished.   Abdominal:    Neurological: She is alert and oriented to person, place, and time.  Skin: Skin is warm.     Data Reviewed Ultrasound examination of the surgical site showed no evidence of seroma.  Assessment    Slow progress post excision of a chronic umbilical sinus, likely secondary to retained suture and subsequent repair of incarcerated ventral hernia.  Ongoing narcotic use.  Long-standing orthopedic issues.    Plan  Patient has been instructed to make use of Neosporin  ointment to the area after daily showers.  She is been asked to make use of Aleve 2 tablets twice a day to see if this helps improve some of her abdominal discomfort, as well as her left knee pain pending orthopedic reassessment.  A new prescription for Norco was prescribed, and she needs to stretch out its use from every 6 hours to every 8 hours.  No refills will be provided before her next visit scheduled in 2 weeks.  We will contact her orthopedist Elray Buba, MD, [phone]) and and consider a cortisone injection for her knee pain if indicated.   Return in two weeks. The patient is aware to call back for any questions or concerns.   HPI, Physical Exam, Assessment and Plan have been scribed under the direction and in the presence of Hervey Ard, MD.  Gaspar Cola, CMA  I have completed the exam and reviewed the above documentation for accuracy and completeness.  I agree with the above.  Haematologist has been used and any errors in dictation or transcription are unintentional.  Hervey Ard, M.D., F.A.C.S.    Jamie Bennett 04/02/2018, 9:10 PM

## 2018-04-05 ENCOUNTER — Ambulatory Visit (INDEPENDENT_AMBULATORY_CARE_PROVIDER_SITE_OTHER): Payer: Medicare HMO | Admitting: Nurse Practitioner

## 2018-04-05 ENCOUNTER — Encounter: Payer: Self-pay | Admitting: Nurse Practitioner

## 2018-04-05 VITALS — BP 140/80 | HR 90 | Temp 98.0°F | Resp 16 | Ht 65.0 in | Wt 324.0 lb

## 2018-04-05 DIAGNOSIS — T8131XA Disruption of external operation (surgical) wound, not elsewhere classified, initial encounter: Secondary | ICD-10-CM | POA: Diagnosis not present

## 2018-04-05 DIAGNOSIS — L02211 Cutaneous abscess of abdominal wall: Secondary | ICD-10-CM | POA: Diagnosis not present

## 2018-04-05 MED ORDER — CIPROFLOXACIN HCL 500 MG PO TABS: 500.0000 mg | ORAL_TABLET | Freq: Two times a day (BID) | ORAL | 0 refills | Status: DC

## 2018-04-05 NOTE — Progress Notes (Signed)
[facility] [place], [place] [address]  Internal MEDICINE  Office Visit Note  Patient Name: Jamie Bennett  [id number]  [id number]  Date of Service: 04/14/2018   Pt is here for a sick visit.   Chief Complaint  Patient presents with  . Gastroesophageal Reflux  . Other    infected wound     The patient has had multiple surgeries to remove a chronic umbilical sinus and hernia repair. Her last surgery was 03/07/2018. Since this surgery, her wound has really not healed. She continues to have tenderness along the wound along with generalized abdominal tenderness. She and her husband feels as though the surgical wound has opened more over the past week. They are extremely concerned about infection and really just don't believe this is normal. They did see the surgeon earlier this week and he feels that even though the wound is healing slowly, the wound continues to heal. Progress notes from this visit were reviewed at the time of the visit.        Current Medication:  Outpatient Encounter Medications as of 04/05/2018  Medication Sig  . aspirin EC 81 MG tablet Take 81 mg by mouth daily.  . busPIRone (BUSPAR) 15 MG tablet Take 1 tablet (15 mg total) by mouth 4 (four) times daily. (Patient taking differently: Take 30 mg by mouth 2 (two) times daily. )  . Cholecalciferol (HM VITAMIN D3) 4000 units CAPS Take 4,000 Units by mouth daily.   . ciclopirox (PENLAC) 8 % solution Apply to affected nail and surrounding nail bed QHS  . clonazePAM (KLONOPIN) 0.5 MG tablet For acute anxiety, emergencies only  . Coenzyme Q-10 200 MG CAPS Take 200 mg by mouth 2 (two) times daily.   . cyanocobalamin (,VITAMIN B-12,) 1000 MCG/ML injection Inject 1,000 mcg into the muscle every 30 (thirty) days.  Marland Kitchen esomeprazole (NEXIUM) 20 MG capsule Take 20 mg by mouth 2 (two) times daily before a meal.  . Fiber, Guar Gum, CHEW Chew 5 mg by mouth daily.   Marland Kitchen HYDROcodone-acetaminophen (NORCO)  5-325 MG tablet Take 1 tablet by mouth every 6 (six) hours as needed for moderate pain.  . [EXPIRED] HYDROcodone-acetaminophen (NORCO/VICODIN) 5-325 MG tablet Take 1 tablet by mouth every 8 (eight) hours as needed for up to 10 days for moderate pain.  . hydroxychloroquine (PLAQUENIL) 200 MG tablet Take 1 tablet by mouth twice a day Monday through Friday  . hydrOXYzine (ATARAX/VISTARIL) 50 MG tablet Take 1 tablet (50 mg total) by mouth 3 (three) times daily as needed for anxiety.  Marland Kitchen ibuprofen (ADVIL,MOTRIN) 100 MG tablet Take 100 mg by mouth as needed for fever.  Javier Docker Oil 500 MG CAPS Take 500 mg by mouth daily.  Marland Kitchen lamoTRIgine (LAMICTAL) 150 MG tablet Take 1 tablet (150 mg total) by mouth 2 (two) times daily.  Marland Kitchen levothyroxine (SYNTHROID, LEVOTHROID) 112 MCG tablet Take 1 tablet (112 mcg total) by mouth daily. (Patient taking differently: Take 112 mcg by mouth daily before breakfast. )  . loratadine (CLARITIN) 10 MG tablet Take 10 mg by mouth daily as needed for allergies.  . polyethylene glycol (MIRALAX) packet Take 17 g by mouth daily.  . sucralfate (CARAFATE) 1 g tablet Take 1 tablet (1 g total) by mouth 2 (two) times daily.  Marland Kitchen tiZANidine (ZANAFLEX) 4 MG tablet Take 1 tablet (4 mg total) by mouth 3 (three) times daily as needed. (Patient taking differently: Take 4 mg by mouth 3 (three) times daily as needed  for muscle spasms. )  . traZODone (DESYREL) 100 MG tablet Take 2 tablets (200 mg total) by mouth at bedtime. (Patient taking differently: Take 100 mg by mouth at bedtime as needed and may repeat dose one time if needed for sleep. )  . venlafaxine (EFFEXOR) 100 MG tablet Take 1 tablet (100 mg total) by mouth 3 (three) times daily with meals.  . zoledronic acid (RECLAST) 5 MG/100ML SOLN injection Inject 5 mg into the vein See admin instructions. Patient takes yearly  . ciprofloxacin (CIPRO) 500 MG tablet Take 1 tablet (500 mg total) by mouth 2 (two) times daily.   No facility-administered  encounter medications on file as of 04/05/2018.       Medical History: Past Medical History:  Diagnosis Date  . Anemia   . Anxiety   . Bipolar 1 disorder ([place])   . Collagen vascular disease ([place])    rhematoid arthritis  . Constipation   . Fibromyalgia   . GERD (gastroesophageal reflux disease)   . Heart murmur    mild-asymptomatic  . Hepatic steatosis   . History of Roux-en-Y gastric bypass   . Hypotension   . Hypothyroidism   . Opioid abuse ([place])   . Osteoarthritis   . Osteoporosis   . PONV (postoperative nausea and vomiting)    nausea only  . Rheumatic fever   . Rheumatoid arthritis ([place])   . Thyroid disease      Today's Vitals   04/05/18 1147  BP: 140/80  Pulse: 90  Resp: 16  Temp: 98 F (36.7 C)  SpO2: 98%  Weight: (!) 324 lb (147 kg)  Height: 5\' 5"  (1.651 m)    Review of Systems  Constitutional: Negative for activity change, chills, fatigue and unexpected weight change.  HENT: Negative for congestion, postnasal drip, rhinorrhea, sneezing and sore throat.   Eyes: Negative.  Negative for redness.  Respiratory: Negative for cough, chest tightness, shortness of breath and wheezing.   Cardiovascular: Negative for chest pain and palpitations.  Gastrointestinal: Positive for abdominal pain. Negative for constipation, diarrhea, nausea and vomiting.       Surgical wound from removal of chronic umbilical sinus and hernia repair continues to open more and is very tender to touch.   Endocrine: Negative for cold intolerance, heat intolerance, polydipsia, polyphagia and polyuria.       Well controlled thyroid issues.   Genitourinary: Negative.  Negative for dysuria and frequency.  Musculoskeletal: Positive for arthralgias, back pain and myalgias. Negative for joint swelling and neck pain.  Skin: Positive for rash.       Surgical wound not healing as expected. Opening more over the past week or so. Tender. Has recently noted drainage from the wound .    Allergic/Immunologic: Negative for environmental allergies.  Neurological: Positive for dizziness and headaches. Negative for tremors and numbness.  Hematological: Negative for adenopathy. Does not bruise/bleed easily.  Psychiatric/Behavioral: Positive for dysphoric mood. Negative for behavioral problems (Depression), sleep disturbance and suicidal ideas. The patient is nervous/anxious.        Patient is regularly sees psychiatry.     Physical Exam  Constitutional: She is oriented to person, place, and time. She appears well-developed and well-nourished. No distress.  HENT:  Head: Normocephalic and atraumatic.  Nose: Nose normal.  Mouth/Throat: Oropharynx is clear and moist. No oropharyngeal exudate.  Eyes: Pupils are equal, round, and reactive to light. Conjunctivae and EOM are normal.  Neck: Normal range of motion. Neck supple. No JVD present. No tracheal deviation  present. No thyromegaly present.  Cardiovascular: Normal rate, regular rhythm and normal heart sounds. Exam reveals no gallop and no friction rub.  No murmur heard. Pulmonary/Chest: Effort normal and breath sounds normal. No respiratory distress. She has no wheezes. She has no rales. She exhibits no tenderness.  Abdominal: Soft. Bowel sounds are normal. There is generalized tenderness.    Musculoskeletal: Normal range of motion.  Lymphadenopathy:    She has no cervical adenopathy.  Neurological: She is alert and oriented to person, place, and time. She displays normal reflexes. No cranial nerve deficit.  Skin: Skin is warm and dry. Capillary refill takes less than 2 seconds. She is not diaphoretic.  There is some redness and scabbing noted in the naval. Very mild erythema present. No current bleeding or drainage is noted. However, there is dried blood around the pending of the naval.   Psychiatric: Her speech is normal and behavior is normal. Judgment and thought content normal. Cognition and memory are normal. She exhibits  a depressed mood.  Nursing note and vitals reviewed.  Assessment/Plan: 1. Surgical wound dehiscence, initial encounter Suggest referral to wound clinic for further evaluation.  - Ambulatory referral to Wound Clinic  2. Abscess of abdominal wall Start ciprofloxacin 500mg  bid for 10 days. Recommend she discuss further with surgeon.  - ciprofloxacin (CIPRO) 500 MG tablet; Take 1 tablet (500 mg total) by mouth 2 (two) times daily.  Dispense: 20 tablet; Refill: 0  General Counseling: Nastashia verbalizes understanding of the findings of todays visit and agrees with plan of treatment. I have discussed any further diagnostic evaluation that may be needed or ordered today. We also reviewed her medications today. she has been encouraged to call the office with any questions or concerns that should arise related to todays visit.    Counseling:  This patient was seen by Leretha Pol FNP Collaboration with Dr Lavera Guise as a part of collaborative care agreement  Orders Placed This Encounter  Procedures  . Ambulatory referral to Wound Clinic    Meds ordered this encounter  Medications  . ciprofloxacin (CIPRO) 500 MG tablet    Sig: Take 1 tablet (500 mg total) by mouth 2 (two) times daily.    Dispense:  20 tablet    Refill:  0    Order Specific Question:   Supervising Provider    Answer:   Lavera Guise [[id number]]    Time spent: 20 Minutes

## 2018-04-14 DIAGNOSIS — T8131XA Disruption of external operation (surgical) wound, not elsewhere classified, initial encounter: Secondary | ICD-10-CM | POA: Insufficient documentation

## 2018-04-16 ENCOUNTER — Ambulatory Visit (INDEPENDENT_AMBULATORY_CARE_PROVIDER_SITE_OTHER): Payer: Medicare HMO | Admitting: General Surgery

## 2018-04-16 ENCOUNTER — Telehealth: Payer: Self-pay | Admitting: General Surgery

## 2018-04-16 ENCOUNTER — Encounter: Payer: Self-pay | Admitting: General Surgery

## 2018-04-16 VITALS — BP 128/82 | HR 80 | Temp 97.5°F | Wt 332.0 lb

## 2018-04-16 DIAGNOSIS — F4010 Social phobia, unspecified: Secondary | ICD-10-CM | POA: Diagnosis not present

## 2018-04-16 DIAGNOSIS — K436 Other and unspecified ventral hernia with obstruction, without gangrene: Secondary | ICD-10-CM

## 2018-04-16 DIAGNOSIS — Q644 Malformation of urachus: Secondary | ICD-10-CM

## 2018-04-16 DIAGNOSIS — R69 Illness, unspecified: Secondary | ICD-10-CM | POA: Diagnosis not present

## 2018-04-16 DIAGNOSIS — F603 Borderline personality disorder: Secondary | ICD-10-CM | POA: Diagnosis not present

## 2018-04-16 MED ORDER — TRAMADOL HCL 50 MG PO TABS: 50.0000 mg | ORAL_TABLET | Freq: Three times a day (TID) | ORAL | 0 refills | Status: DC | PRN

## 2018-04-16 NOTE — Telephone Encounter (Signed)
She states she talked with her counselor and was wondering if she could try tramadol to see if it would help or tylenol #3

## 2018-04-16 NOTE — Progress Notes (Signed)
Patient ID: Jamie Bennett, female   DOB: October 29, 1960, 58 y.o.   MRN: [id number]  Chief Complaint  Patient presents with  . Routine Post Op    Exploritory laparotomy and hernia repair, Sx 03/07/18    HPI Jamie Bennett is a 57 y.o. female  here today for her post op hernia repair done on 03/07/2018. Patient states she is still having sharp pain on her lower abdomen. Patient also stated that she has pain on her incision site. Patient is able to eat. Moving her bowels daily. Patient has not had any nausea and/or vomiting. Patient has been having gas. Patient reports having abdominal pain with gas, bending, standing for extended times, walking and sitting.  She has not had any nausea or vomiting. HPI  Past Medical History:  Diagnosis Date  . Anemia   . Anxiety   . Bipolar 1 disorder ([place])   . Collagen vascular disease ([place])    rhematoid arthritis  . Constipation   . Fibromyalgia   . GERD (gastroesophageal reflux disease)   . Heart murmur    mild-asymptomatic  . Hepatic steatosis   . History of Roux-en-Y gastric bypass   . Hypotension   . Hypothyroidism   . Opioid abuse ([place])   . Osteoarthritis   . Osteoporosis   . PONV (postoperative nausea and vomiting)    nausea only  . Rheumatic fever   . Rheumatoid arthritis ([place])   . Thyroid disease     Past Surgical History:  Procedure Laterality Date  . CHOLECYSTECTOMY  2004  . COLONOSCOPY N/A 07/16/2017   Procedure: COLONOSCOPY;  Surgeon: Manya Silvas, MD;  Location: [facility] ENDOSCOPY;  Service: Endoscopy;  Laterality: N/A;  . EXPLORATORY LAPAROTOMY  2009  . GASTRIC BYPASS  2003   [place] N/A 03/01/2018   Procedure: EXPLORATORY LAPAROTOMY/ UMBILECTOMY;  Surgeon: Robert Bellow, MD;  Location: [facility] ORS;  Service: General;  Laterality: N/A;  . TONSILLECTOMY    . VENTRAL HERNIA REPAIR N/A 03/07/2018   Procedure: HERNIA REPAIR VENTRAL ADULT;  Surgeon: Robert Bellow, MD;  Location: [facility] ORS;   Service: General;  Laterality: N/A;    Family History  Problem Relation Age of Onset  . Depression Mother   . Dementia Mother   . Heart disease Mother   . Parkinson's disease Father   . Colon cancer Neg Hx     Social History Social History   Tobacco Use  . Smoking status: Never Smoker  . Smokeless tobacco: Never Used  Substance Use Topics  . Alcohol use: No    Alcohol/week: 0.0 standard drinks  . Drug use: No    Allergies  Allergen Reactions  . Lactose Intolerance (Gi) Other (See Comments)    Bloating and GI distress    Current Outpatient Medications  Medication Sig Dispense Refill  . aspirin EC 81 MG tablet Take 81 mg by mouth daily.    . busPIRone (BUSPAR) 15 MG tablet Take 1 tablet (15 mg total) by mouth 4 (four) times daily. (Patient taking differently: Take 30 mg by mouth 2 (two) times daily. ) 360 tablet 0  . Cholecalciferol (HM VITAMIN D3) 4000 units CAPS Take 4,000 Units by mouth daily.     . ciclopirox (PENLAC) 8 % solution Apply to affected nail and surrounding nail bed QHS 6.6 mL 2  . clonazePAM (KLONOPIN) 0.5 MG tablet For acute anxiety, emergencies only 30 tablet 0  . Coenzyme Q-10 200 MG CAPS Take 200 mg by  mouth 2 (two) times daily.     . cyanocobalamin (,VITAMIN B-12,) 1000 MCG/ML injection Inject 1,000 mcg into the muscle every 30 (thirty) days.    Marland Kitchen esomeprazole (NEXIUM) 20 MG capsule Take 20 mg by mouth 2 (two) times daily before a meal.    . Fiber, Guar Gum, CHEW Chew 5 mg by mouth daily.     . hydroxychloroquine (PLAQUENIL) 200 MG tablet Take 1 tablet by mouth twice a day Monday through Friday 120 tablet 0  . ibuprofen (ADVIL,MOTRIN) 100 MG tablet Take 100 mg by mouth as needed for fever.    Javier Docker Oil 500 MG CAPS Take 500 mg by mouth daily.    Marland Kitchen lamoTRIgine (LAMICTAL) 150 MG tablet Take 1 tablet (150 mg total) by mouth 2 (two) times daily. 180 tablet 1  . levothyroxine (SYNTHROID, LEVOTHROID) 112 MCG tablet Take 1 tablet (112 mcg total) by mouth  daily. (Patient taking differently: Take 112 mcg by mouth daily before breakfast. ) 90 tablet 1  . loratadine (CLARITIN) 10 MG tablet Take 10 mg by mouth daily as needed for allergies.    . polyethylene glycol (MIRALAX) packet Take 17 g by mouth daily. 14 each 0  . sucralfate (CARAFATE) 1 g tablet Take 1 tablet (1 g total) by mouth 2 (two) times daily. 120 tablet 1  . tiZANidine (ZANAFLEX) 4 MG tablet Take 1 tablet (4 mg total) by mouth 3 (three) times daily as needed. (Patient taking differently: Take 4 mg by mouth 3 (three) times daily as needed for muscle spasms. ) 90 tablet 1  . traZODone (DESYREL) 100 MG tablet Take 2 tablets (200 mg total) by mouth at bedtime. (Patient taking differently: Take 100 mg by mouth at bedtime as needed and may repeat dose one time if needed for sleep. ) 180 tablet 1  . zoledronic acid (RECLAST) 5 MG/100ML SOLN injection Inject 5 mg into the vein See admin instructions. Patient takes yearly    . desipramine (NORPRAMIN) 25 MG tablet 1  qam 30 tablet 2  . hydrOXYzine (ATARAX/VISTARIL) 50 MG tablet Take 1 tablet (50 mg total) by mouth 3 (three) times daily as needed for anxiety. 270 tablet 1  . traMADol (ULTRAM) 50 MG tablet Take 1 tablet (50 mg total) by mouth every 8 (eight) hours as needed. 24 tablet 0  . venlafaxine (EFFEXOR) 100 MG tablet Take 1 tablet (100 mg total) by mouth 3 (three) times daily with meals. 90 tablet 4   No current facility-administered medications for this visit.     Review of Systems Review of Systems  Gastrointestinal: Negative.     Blood pressure 128/82, pulse 80, temperature (!) 97.5 F (36.4 C), temperature source Skin, weight (!) 332 lb (150.6 kg). The patient's weight is up 2 pounds from her evaluation on March 21, 2018. Physical Exam Physical Exam  Constitutional: She is oriented to person, place, and time. She appears well-developed and well-nourished.  Abdominal: Soft. Bowel sounds are normal.    Silver nitrate applied to  open incision.  Neurological: She is alert and oriented to person, place, and time.  Skin: Skin is warm and dry.  Psychiatric: She has a normal mood and affect. Her behavior is normal.    Data Reviewed Photo documented 3 provided by her husband reviewed.  His great concern is of widening of the most inferior portion of the wound where the lateral traction forces are greatest.  No evidence of deep hematoma, seroma, mass-effect or focal tenderness.  Exam  completed in the supine and standing position.    Assessment    Painfully slow progress post repair of an incarcerated ventral hernia post excision umbilical sinus that extended into the peritoneal cavity.    Plan    CT Scan abdomen and pelvis with contrast.  To assess for secondary process that might account for the patient's pain out of proportion to physical findings.  The patient is now month out from surgery, and the need to step down from hydrocodone was emphasized.  A prescription for tramadol has been sent to her pharmacy.   HPI, Physical Exam, Assessment and Plan have been scribed under the direction and in the presence of Robert Bellow, MD. Wayna Chalet, CMA       I have completed the exam and reviewed the above documentation for accuracy and completeness.  I agree with the above.  Haematologist has been used and any errors in dictation or transcription are unintentional.  Hervey Ard, M.D., F.A.C.S.  The patient is scheduled for a CT scan at [place] on 04/24/18 at 11:30 am. She will arrive by 11:15 am and have nothing to eat or drink for 4 hours prior. She will pick up a prep kit prior. The patient is aware of date, time, and instructions.  Documented by Caryl-Lyn Otis Brace LPN   Forest Gleason Jaquayla Hege 04/17/2018, 6:02 PM

## 2018-04-16 NOTE — Telephone Encounter (Signed)
Pt aware tramadol #20 sent in electronically by Dr [name].

## 2018-04-16 NOTE — Patient Instructions (Addendum)
The patient is aware to call back for any questions or concerns.  We will schedule a CT Scan.  The patient is scheduled for a CT scan at [place] on 04/24/18 at 11:30 am. She will arrive by 11:15 am and have nothing to eat or drink for 4 hours prior. She will pick up a prep kit prior. The patient is aware of date, time, and instructions.   Patient may make use of Aleve 2 twice a day and use heat to the area for pain relief. Documented by Lesly Rubenstein LPN

## 2018-04-16 NOTE — Telephone Encounter (Signed)
Patient is calling asking if there is another type of pain medication that she would be able to get. Please call patient advise.

## 2018-04-17 ENCOUNTER — Encounter (HOSPITAL_COMMUNITY): Payer: Self-pay | Admitting: Psychiatry

## 2018-04-17 ENCOUNTER — Ambulatory Visit (HOSPITAL_COMMUNITY): Payer: Medicare HMO | Admitting: Psychiatry

## 2018-04-17 VITALS — BP 144/94 | HR 91 | Ht 65.0 in | Wt 333.0 lb

## 2018-04-17 DIAGNOSIS — F325 Major depressive disorder, single episode, in full remission: Secondary | ICD-10-CM | POA: Diagnosis not present

## 2018-04-17 DIAGNOSIS — F411 Generalized anxiety disorder: Secondary | ICD-10-CM | POA: Diagnosis not present

## 2018-04-17 DIAGNOSIS — F332 Major depressive disorder, recurrent severe without psychotic features: Secondary | ICD-10-CM | POA: Diagnosis not present

## 2018-04-17 DIAGNOSIS — F603 Borderline personality disorder: Secondary | ICD-10-CM

## 2018-04-17 DIAGNOSIS — R69 Illness, unspecified: Secondary | ICD-10-CM | POA: Diagnosis not present

## 2018-04-17 MED ORDER — VENLAFAXINE HCL 100 MG PO TABS: 100.0000 mg | ORAL_TABLET | Freq: Three times a day (TID) | ORAL | 0 refills | Status: DC

## 2018-04-17 MED ORDER — DESIPRAMINE HCL 25 MG PO TABS: ORAL_TABLET | ORAL | 2 refills | Status: DC

## 2018-04-17 MED ORDER — HYDROXYZINE HCL 50 MG PO TABS: 50.0000 mg | ORAL_TABLET | Freq: Three times a day (TID) | ORAL | 1 refills | Status: DC | PRN

## 2018-04-17 MED ORDER — VENLAFAXINE HCL 100 MG PO TABS: 100.0000 mg | ORAL_TABLET | Freq: Three times a day (TID) | ORAL | 4 refills | Status: DC

## 2018-04-17 NOTE — Progress Notes (Deleted)
oh

## 2018-04-17 NOTE — Progress Notes (Signed)
Psychiatric Initial Adult Assessment   Patient Identification: Jamie Bennett MRN:  [id number] Date of Evaluation:  04/25/2018 Referral Source: grams per previous psychiatrist Chief Complaint:   Visit Diagnosis: borderline personality disorder   ICD-10-CM   1. Major depression in remission ([place]) F32.5   2. Anxiety state F41.1 venlafaxine (EFFEXOR) 100 MG tablet    hydrOXYzine (ATARAX/VISTARIL) 50 MG tablet    DISCONTINUED: venlafaxine (EFFEXOR) 100 MG tablet  3. Borderline personality disorder ([place]) F60.3 venlafaxine (EFFEXOR) 100 MG tablet    DISCONTINUED: venlafaxine (EFFEXOR) 100 MG tablet  4. Major depressive disorder, recurrent, severe without psychotic features ([place]) F33.2 venlafaxine (EFFEXOR) 100 MG tablet    DISCONTINUED: venlafaxine (EFFEXOR) 100 MG tablet    History of Present Illness:  Patient is a 74 year oldmarried mother is been psychiatrically hospitalized 10 times. He was recently under the care of Dr. Luane School. At this time the patient actually is doing fairly well. She was seen alone initially but then her husband Jamie Bennett was seen separately. The patient been married for 51 years which she describes very stable marriage presently her husband is unemployed and presently the patient has a lot financial issues. Her husband is unemployed and they live all her disability. Patient also has significant problems with her left knee. The patient is a piece. The patient had gastric bypass surgery in the past. She's retired Pharmacist, hospital. At this time the patient denies being depressed. She is sleeping and eating fairly well. She's got good concentration. She describes him as having very low energy. She feels fairly good sense of worth. She's not suicidal now but she's made 2 suicide attempts in the past. Patient had multiple episodes where she's been self-mutilation mainly cutting herself. The patient denies episodes of rage.Her husband describes her as being very demanding. The patient denies  the use of alcohol or drugs.The patient enjoys cooking reading watching TV. The patient has had problems with alcohol recent past. 2016 she abuse Provigil and at times abused alcohol. She's a great problem using medicines to the point where now her husbandcontrols all medications. The patient denies the use of drugs. She has no evidence of psychosis. She is no evidence of generalized anxiety disorder panic disorder or obsessive-compulsive disorder. Presently the patient is in psychotherapy with a DBT therapist in [place]. Medical illnesses; obesity arthritis thyroid disease Psychiatric illnesses history includes 10 psychiatric hospitalizations. He seen multiple different psychiatrist presently she takes BuSpar 30 mg twice a day Klonopin 0.5 mg when necessary day, Lamictal 150 mg, trazodone when necessary, Effexor 100 mg 3 times a day. The patient also take Vistaril 50 mg 3 times a day. The patient denies any significant medical symptomatology at this time. She denies any neurological illness. She recently did have surgery and presently takes tramadol for some GI pain. The patient [facility]. Her parents were intact. She experienced no physical or sexual abuse.  Associated Signs/Symptoms: Depression Symptoms:  fatigue, (Hypo) Manic Symptoms:   Anxiety Symptoms:   Psychotic Symptoms:   PTSD Symptoms:   Past Psychiatric History: 10 psychiatric hospitalizations multiple psychotropic medications presently in psychotherapy  Previous Psychotropic Medications: Yes   Substance Abuse History in the last 12 months:  Yes.    Consequences of Substance Abuse:   Past Medical History:  Past Medical History:  Diagnosis Date  . Anemia   . Anxiety   . Bipolar 1 disorder ([place])   . Collagen vascular disease ([place])    rhematoid arthritis  . Constipation   . Fibromyalgia   .  GERD (gastroesophageal reflux disease)   . Heart murmur    mild-asymptomatic  . Hepatic steatosis   . History of  Roux-en-Y gastric bypass   . Hypotension   . Hypothyroidism   . Opioid abuse ([place])   . Osteoarthritis   . Osteoporosis   . PONV (postoperative nausea and vomiting)    nausea only  . Rheumatic fever   . Rheumatoid arthritis ([place])   . Thyroid disease     Past Surgical History:  Procedure Laterality Date  . CHOLECYSTECTOMY  2004  . COLONOSCOPY N/A 07/16/2017   Procedure: COLONOSCOPY;  Surgeon: Manya Silvas, MD;  Location: [facility] ENDOSCOPY;  Service: Endoscopy;  Laterality: N/A;  . EXPLORATORY LAPAROTOMY  2009  . GASTRIC BYPASS  2003   [place] N/A 03/01/2018   Procedure: EXPLORATORY LAPAROTOMY/ UMBILECTOMY;  Surgeon: Robert Bellow, MD;  Location: [facility] ORS;  Service: General;  Laterality: N/A;  . TONSILLECTOMY    . VENTRAL HERNIA REPAIR N/A 03/07/2018   Procedure: HERNIA REPAIR VENTRAL ADULT;  Surgeon: Robert Bellow, MD;  Location: [facility] ORS;  Service: General;  Laterality: N/A;    Family Psychiatric History:   Family History:  Family History  Problem Relation Age of Onset  . Depression Mother   . Dementia Mother   . Heart disease Mother   . Parkinson's disease Father   . Colon cancer Neg Hx     Social History:   Social History   Socioeconomic History  . Marital status: Married    Spouse name: Not on file  . Number of children: Not on file  . Years of education: Not on file  . Highest education level: Not on file  Occupational History  . Not on file  Social Needs  . Financial resource strain: Not on file  . Food insecurity:    Worry: Not on file    Inability: Not on file  . Transportation needs:    Medical: Not on file    Non-medical: Not on file  Tobacco Use  . Smoking status: Never Smoker  . Smokeless tobacco: Never Used  Substance and Sexual Activity  . Alcohol use: No    Alcohol/week: 0.0 standard drinks  . Drug use: No  . Sexual activity: Never  Lifestyle  . Physical activity:    Days per week: Not on file    Minutes  per session: Not on file  . Stress: Not on file  Relationships  . Social connections:    Talks on phone: Not on file    Gets together: Not on file    Attends religious service: Not on file    Active member of club or organization: Not on file    Attends meetings of clubs or organizations: Not on file    Relationship status: Not on file  Other Topics Concern  . Not on file  Social History Narrative  . Not on file    Additional Social History:   Allergies:   Allergies  Allergen Reactions  . Lactose Intolerance (Gi) Other (See Comments)    Bloating and GI distress    Metabolic Disorder Labs: No results found for: HGBA1C, MPG No results found for: PROLACTIN Lab Results  Component Value Date   TRIG 176 (H) 11/09/2015     Current Medications: Current Outpatient Medications  Medication Sig Dispense Refill  . aspirin EC 81 MG tablet Take 81 mg by mouth daily.    . busPIRone (BUSPAR) 15 MG tablet Take 1  tablet (15 mg total) by mouth 4 (four) times daily. (Patient taking differently: Take 30 mg by mouth 2 (two) times daily. ) 360 tablet 0  . Cholecalciferol (HM VITAMIN D3) 4000 units CAPS Take 4,000 Units by mouth daily.     . ciclopirox (PENLAC) 8 % solution Apply to affected nail and surrounding nail bed QHS 6.6 mL 2  . clonazePAM (KLONOPIN) 0.5 MG tablet For acute anxiety, emergencies only 30 tablet 0  . Coenzyme Q-10 200 MG CAPS Take 200 mg by mouth 2 (two) times daily.     . cyanocobalamin (,VITAMIN B-12,) 1000 MCG/ML injection Inject 1,000 mcg into the muscle every 30 (thirty) days.    Marland Kitchen esomeprazole (NEXIUM) 20 MG capsule Take 20 mg by mouth 2 (two) times daily before a meal.    . Fiber, Guar Gum, CHEW Chew 5 mg by mouth daily.     . hydroxychloroquine (PLAQUENIL) 200 MG tablet Take 1 tablet by mouth twice a day Monday through Friday 120 tablet 0  . hydrOXYzine (ATARAX/VISTARIL) 50 MG tablet Take 1 tablet (50 mg total) by mouth 3 (three) times daily as needed for anxiety.  270 tablet 1  . ibuprofen (ADVIL,MOTRIN) 100 MG tablet Take 100 mg by mouth as needed for fever.    Javier Docker Oil 500 MG CAPS Take 500 mg by mouth daily.    Marland Kitchen lamoTRIgine (LAMICTAL) 150 MG tablet Take 1 tablet (150 mg total) by mouth 2 (two) times daily. 180 tablet 1  . levothyroxine (SYNTHROID, LEVOTHROID) 112 MCG tablet Take 1 tablet (112 mcg total) by mouth daily. (Patient taking differently: Take 112 mcg by mouth daily before breakfast. ) 90 tablet 1  . loratadine (CLARITIN) 10 MG tablet Take 10 mg by mouth daily as needed for allergies.    . polyethylene glycol (MIRALAX) packet Take 17 g by mouth daily. 14 each 0  . sucralfate (CARAFATE) 1 g tablet Take 1 tablet (1 g total) by mouth 2 (two) times daily. 120 tablet 1  . traMADol (ULTRAM) 50 MG tablet Take 1 tablet (50 mg total) by mouth every 8 (eight) hours as needed. 24 tablet 0  . traZODone (DESYREL) 100 MG tablet Take 2 tablets (200 mg total) by mouth at bedtime. (Patient taking differently: Take 100 mg by mouth at bedtime as needed and may repeat dose one time if needed for sleep. ) 180 tablet 1  . venlafaxine (EFFEXOR) 100 MG tablet Take 1 tablet (100 mg total) by mouth 3 (three) times daily with meals. 90 tablet 4  . zoledronic acid (RECLAST) 5 MG/100ML SOLN injection Inject 5 mg into the vein See admin instructions. Patient takes yearly    . desipramine (NORPRAMIN) 25 MG tablet 1  qam 30 tablet 2  . tiZANidine (ZANAFLEX) 4 MG tablet Take 1 tablet (4 mg total) by mouth 3 (three) times daily as needed. 90 tablet 1   No current facility-administered medications for this visit.     Neurologic: Headache: No Seizure: No Paresthesias:NA  Musculoskeletal: Strength & Muscle Tone: within normal limits Gait & Station: normal Patient leans: N/A  Psychiatric Specialty Exam: ROS  Blood pressure (!) 144/94, pulse 91, height 5\' 5"  (1.651 m), weight (!) 333 lb (151 kg), SpO2 98 %.Body mass index is 55.41 kg/m.  General Appearance: Bizarre   Eye Contact:  Good  Speech:  Normal Rate  Volume:  Normal  Mood:  Anxious  Affect:  Congruent  Thought Process:  Goal Directed  Orientation:  NA  Thought  Content:  Logical  Suicidal Thoughts:  No  Homicidal Thoughts:  No  Memory:  Negative  Judgement:  Fair  Insight:  NA and Good  Psychomotor Activity:  Normal  Concentration:    Recall:  Good  Fund of Knowledge:  Language: Good  Akathisia:  No  Handed:  Right  AIMS (if indicated):    Assets:  Desire for Improvement  ADL's:  Intact  Cognition: WNL  Sleep:      Treatment Plan Summary: At this time I suspect the patient does have appear neurological disorder. The patient also has a substance abuse problem as well. Today he takes perfect sense that her husband control her medications. The patient does have clinical depression. I think the patient probably benefits by being on all the medication she's on. Her first problem then is probably that of borderline personality disorder and presently she's an appropriate treatment by being in DBT treatment in [place]. Her second problem is that of clinical depression.She should continue taking Effexor 100 mg 3 times a day for this condition. Her third problem is that of excessive anxiety and rage. For that she will continue taking Lamictal and at this time she'll continue taking Vistaril 25 mg 3 times a day. She takes Klonopin on a when necessary basis. I suspect the patient also significant anxiety related to her depression and makes perfect sense to continue her BuSpar as ordered. Consistent with the fact patient has mood instability is that she's taking Lamictal 150 mg we'll continue her.  So in essence she'll continue taking her mood stabilizer medicines including Effexor, Lamictal. Today because she's describing significant reduction in energy I will go ahead and begin her on desipramine 25 mg each morning. The patient is not suicidal. The patient will continue in therapy and return to see  me in 1-2 months.   Jerral Ralph, MD 9/26/20195:17 PM

## 2018-04-17 NOTE — Progress Notes (Deleted)
Psychiatric Initial Adult Assessment   Patient Identification: MARCILE FUQUAY MRN:  [id number] Date of Evaluation:  04/17/2018 Referral Source: *** Chief Complaint:   Visit Diagnosis:    ICD-10-CM   1. Major depression in remission ([place]) F32.5   2. Anxiety state F41.1 venlafaxine (EFFEXOR) 100 MG tablet    hydrOXYzine (ATARAX/VISTARIL) 50 MG tablet    DISCONTINUED: venlafaxine (EFFEXOR) 100 MG tablet  3. Borderline personality disorder ([place]) F60.3 venlafaxine (EFFEXOR) 100 MG tablet    DISCONTINUED: venlafaxine (EFFEXOR) 100 MG tablet  4. Major depressive disorder, recurrent, severe without psychotic features ([facility]) F33.2 venlafaxine (EFFEXOR) 100 MG tablet    DISCONTINUED: venlafaxine (EFFEXOR) 100 MG tablet    History of Present Illness:  ***  Associated Signs/Symptoms: Depression Symptoms:  {DEPRESSION SYMPTOMS:20000} (Hypo) Manic Symptoms:  {BHH MANIC SYMPTOMS:22872} Anxiety Symptoms:  {BHH ANXIETY SYMPTOMS:22873} Psychotic Symptoms:  {BHH PSYCHOTIC SYMPTOMS:22874} PTSD Symptoms: {BHH PTSD SYMPTOMS:22875}  Past Psychiatric History: ***  Previous Psychotropic Medications: {YES/NO:21197}  Substance Abuse History in the last 12 months:  {yes no:314532}  Consequences of Substance Abuse: {BHH CONSEQUENCES OF SUBSTANCE ABUSE:22880}  Past Medical History:  Past Medical History:  Diagnosis Date  . Anemia   . Anxiety   . Bipolar 1 disorder ([place])   . Collagen vascular disease ([place])    rhematoid arthritis  . Constipation   . Fibromyalgia   . GERD (gastroesophageal reflux disease)   . Heart murmur    mild-asymptomatic  . Hepatic steatosis   . History of Roux-en-Y gastric bypass   . Hypotension   . Hypothyroidism   . Opioid abuse ([place])   . Osteoarthritis   . Osteoporosis   . PONV (postoperative nausea and vomiting)    nausea only  . Rheumatic fever   . Rheumatoid arthritis ([place])   . Thyroid disease     Past Surgical History:  Procedure Laterality Date  .  CHOLECYSTECTOMY  2004  . COLONOSCOPY N/A 07/16/2017   Procedure: COLONOSCOPY;  Surgeon: Manya Silvas, MD;  Location: [facility] ENDOSCOPY;  Service: Endoscopy;  Laterality: N/A;  . EXPLORATORY LAPAROTOMY  2009  . GASTRIC BYPASS  2003   [place] N/A 03/01/2018   Procedure: EXPLORATORY LAPAROTOMY/ UMBILECTOMY;  Surgeon: Robert Bellow, MD;  Location: [facility] ORS;  Service: General;  Laterality: N/A;  . TONSILLECTOMY    . VENTRAL HERNIA REPAIR N/A 03/07/2018   Procedure: HERNIA REPAIR VENTRAL ADULT;  Surgeon: Robert Bellow, MD;  Location: [facility] ORS;  Service: General;  Laterality: N/A;    Family Psychiatric History: ***  Family History:  Family History  Problem Relation Age of Onset  . Depression Mother   . Dementia Mother   . Heart disease Mother   . Parkinson's disease Father   . Colon cancer Neg Hx     Social History:   Social History   Socioeconomic History  . Marital status: Married    Spouse name: Not on file  . Number of children: Not on file  . Years of education: Not on file  . Highest education level: Not on file  Occupational History  . Not on file  Social Needs  . Financial resource strain: Not on file  . Food insecurity:    Worry: Not on file    Inability: Not on file  . Transportation needs:    Medical: Not on file    Non-medical: Not on file  Tobacco Use  . Smoking status: Never Smoker  . Smokeless tobacco: Never Used  Substance and Sexual Activity  . Alcohol use: No    Alcohol/week: 0.0 standard drinks  . Drug use: No  . Sexual activity: Never  Lifestyle  . Physical activity:    Days per week: Not on file    Minutes per session: Not on file  . Stress: Not on file  Relationships  . Social connections:    Talks on phone: Not on file    Gets together: Not on file    Attends religious service: Not on file    Active member of club or organization: Not on file    Attends meetings of clubs or organizations: Not on file     Relationship status: Not on file  Other Topics Concern  . Not on file  Social History Narrative  . Not on file    Additional Social History: ***  Allergies:   Allergies  Allergen Reactions  . Lactose Intolerance (Gi) Other (See Comments)    Bloating and GI distress    Metabolic Disorder Labs: No results found for: HGBA1C, MPG No results found for: PROLACTIN Lab Results  Component Value Date   TRIG 176 (H) 11/09/2015     Current Medications: Current Outpatient Medications  Medication Sig Dispense Refill  . aspirin EC 81 MG tablet Take 81 mg by mouth daily.    . busPIRone (BUSPAR) 15 MG tablet Take 1 tablet (15 mg total) by mouth 4 (four) times daily. (Patient taking differently: Take 30 mg by mouth 2 (two) times daily. ) 360 tablet 0  . Cholecalciferol (HM VITAMIN D3) 4000 units CAPS Take 4,000 Units by mouth daily.     . ciclopirox (PENLAC) 8 % solution Apply to affected nail and surrounding nail bed QHS 6.6 mL 2  . clonazePAM (KLONOPIN) 0.5 MG tablet For acute anxiety, emergencies only 30 tablet 0  . Coenzyme Q-10 200 MG CAPS Take 200 mg by mouth 2 (two) times daily.     . cyanocobalamin (,VITAMIN B-12,) 1000 MCG/ML injection Inject 1,000 mcg into the muscle every 30 (thirty) days.    Marland Kitchen esomeprazole (NEXIUM) 20 MG capsule Take 20 mg by mouth 2 (two) times daily before a meal.    . Fiber, Guar Gum, CHEW Chew 5 mg by mouth daily.     . hydroxychloroquine (PLAQUENIL) 200 MG tablet Take 1 tablet by mouth twice a day Monday through Friday 120 tablet 0  . hydrOXYzine (ATARAX/VISTARIL) 50 MG tablet Take 1 tablet (50 mg total) by mouth 3 (three) times daily as needed for anxiety. 270 tablet 1  . ibuprofen (ADVIL,MOTRIN) 100 MG tablet Take 100 mg by mouth as needed for fever.    Javier Docker Oil 500 MG CAPS Take 500 mg by mouth daily.    Marland Kitchen lamoTRIgine (LAMICTAL) 150 MG tablet Take 1 tablet (150 mg total) by mouth 2 (two) times daily. 180 tablet 1  . levothyroxine (SYNTHROID, LEVOTHROID)  112 MCG tablet Take 1 tablet (112 mcg total) by mouth daily. (Patient taking differently: Take 112 mcg by mouth daily before breakfast. ) 90 tablet 1  . loratadine (CLARITIN) 10 MG tablet Take 10 mg by mouth daily as needed for allergies.    . polyethylene glycol (MIRALAX) packet Take 17 g by mouth daily. 14 each 0  . sucralfate (CARAFATE) 1 g tablet Take 1 tablet (1 g total) by mouth 2 (two) times daily. 120 tablet 1  . tiZANidine (ZANAFLEX) 4 MG tablet Take 1 tablet (4 mg total) by mouth 3 (three) times daily as  needed. (Patient taking differently: Take 4 mg by mouth 3 (three) times daily as needed for muscle spasms. ) 90 tablet 1  . traMADol (ULTRAM) 50 MG tablet Take 1 tablet (50 mg total) by mouth every 8 (eight) hours as needed. 24 tablet 0  . traZODone (DESYREL) 100 MG tablet Take 2 tablets (200 mg total) by mouth at bedtime. (Patient taking differently: Take 100 mg by mouth at bedtime as needed and may repeat dose one time if needed for sleep. ) 180 tablet 1  . venlafaxine (EFFEXOR) 100 MG tablet Take 1 tablet (100 mg total) by mouth 3 (three) times daily with meals. 90 tablet 4  . zoledronic acid (RECLAST) 5 MG/100ML SOLN injection Inject 5 mg into the vein See admin instructions. Patient takes yearly    . desipramine (NORPRAMIN) 25 MG tablet 1  qam 30 tablet 2   No current facility-administered medications for this visit.     Neurologic: Headache: {BHH YES OR NO:22294} Seizure: {BHH YES OR NO:22294} Paresthesias:{BHH YES OR NO:22294}  Musculoskeletal: Strength & Muscle Tone: {desc; muscle tone:32375} Gait & Station: {PE GAIT ED NATL:22525} Patient leans: {Patient Leans:21022755}  Psychiatric Specialty Exam: ROS  Blood pressure (!) 144/94, pulse 91, height 5\' 5"  (1.651 m), weight (!) 333 lb (151 kg), SpO2 98 %.Body mass index is 55.41 kg/m.  General Appearance: {Appearance:22683}  Eye Contact:  {BHH EYE CONTACT:22684}  Speech:  {Speech:22685}  Volume:  {Volume (PAA):22686}   Mood:  {BHH MOOD:22306}  Affect:  {Affect (PAA):22687}  Thought Process:  {Thought Process (PAA):22688}  Orientation:  {BHH ORIENTATION (PAA):22689}  Thought Content:  {Thought Content:22690}  Suicidal Thoughts:  {ST/HT (PAA):22692}  Homicidal Thoughts:  {ST/HT (PAA):22692}  Memory:  {BHH MEMORY:22881}  Judgement:  {Judgement (PAA):22694}  Insight:  {Insight (PAA):22695}  Psychomotor Activity:  {Psychomotor (PAA):22696}  Concentration:  {Concentration:21399}  Recall:  {BHH GOOD/FAIR/POOR:22877}  Fund of Knowledge:{BHH GOOD/FAIR/POOR:22877}  Language: {BHH GOOD/FAIR/POOR:22877}  Akathisia:  {BHH YES OR NO:22294}  Handed:  {Handed:22697}  AIMS (if indicated):  ***  Assets:  {Assets (PAA):22698}  ADL's:  {BHH DQQ'I:29798}  Cognition: {chl bhh cognition:304700322}  Sleep:  ***    Treatment Plan Summary: {CHL AMB BH MD TX XQJJ:[id number]}   Jerral Ralph, MD 9/18/20193:56 PM

## 2018-04-17 NOTE — Progress Notes (Deleted)
Psychiatric Initial Adult Assessment   Patient Identification: Jamie Bennett MRN:  [id number] Date of Evaluation:  04/17/2018 Referral Source: *** Chief Complaint:   Visit Diagnosis:    ICD-10-CM   1. Anxiety state F41.1 venlafaxine (EFFEXOR) 100 MG tablet    hydrOXYzine (ATARAX/VISTARIL) 50 MG tablet    DISCONTINUED: venlafaxine (EFFEXOR) 100 MG tablet  2. Borderline personality disorder ([place]) F60.3 venlafaxine (EFFEXOR) 100 MG tablet    DISCONTINUED: venlafaxine (EFFEXOR) 100 MG tablet  3. Major depressive disorder, recurrent, severe without psychotic features (HCC) F33.2 venlafaxine (EFFEXOR) 100 MG tablet    DISCONTINUED: venlafaxine (EFFEXOR) 100 MG tablet    History of Present Illness:  ***  Associated Signs/Symptoms: Depression Symptoms:  {DEPRESSION SYMPTOMS:20000} (Hypo) Manic Symptoms:  {BHH MANIC SYMPTOMS:22872} Anxiety Symptoms:  {BHH ANXIETY SYMPTOMS:22873} Psychotic Symptoms:  {BHH PSYCHOTIC SYMPTOMS:22874} PTSD Symptoms: {BHH PTSD SYMPTOMS:22875}  Past Psychiatric History: ***  Previous Psychotropic Medications: {YES/NO:21197}  Substance Abuse History in the last 12 months:  {yes no:314532}  Consequences of Substance Abuse: {BHH CONSEQUENCES OF SUBSTANCE ABUSE:22880}  Past Medical History:  Past Medical History:  Diagnosis Date  . Anemia   . Anxiety   . Bipolar 1 disorder ([place])   . Collagen vascular disease ([place])    rhematoid arthritis  . Constipation   . Fibromyalgia   . GERD (gastroesophageal reflux disease)   . Heart murmur    mild-asymptomatic  . Hepatic steatosis   . History of Roux-en-Y gastric bypass   . Hypotension   . Hypothyroidism   . Opioid abuse ([place])   . Osteoarthritis   . Osteoporosis   . PONV (postoperative nausea and vomiting)    nausea only  . Rheumatic fever   . Rheumatoid arthritis ([place])   . Thyroid disease     Past Surgical History:  Procedure Laterality Date  . CHOLECYSTECTOMY  2004  . COLONOSCOPY N/A  07/16/2017   Procedure: COLONOSCOPY;  Surgeon: Manya Silvas, MD;  Location: [facility] ENDOSCOPY;  Service: Endoscopy;  Laterality: N/A;  . EXPLORATORY LAPAROTOMY  2009  . GASTRIC BYPASS  2003   [place] N/A 03/01/2018   Procedure: EXPLORATORY LAPAROTOMY/ UMBILECTOMY;  Surgeon: Robert Bellow, MD;  Location: [facility] ORS;  Service: General;  Laterality: N/A;  . TONSILLECTOMY    . VENTRAL HERNIA REPAIR N/A 03/07/2018   Procedure: HERNIA REPAIR VENTRAL ADULT;  Surgeon: Robert Bellow, MD;  Location: [facility] ORS;  Service: General;  Laterality: N/A;    Family Psychiatric History: ***  Family History:  Family History  Problem Relation Age of Onset  . Depression Mother   . Dementia Mother   . Heart disease Mother   . Parkinson's disease Father   . Colon cancer Neg Hx     Social History:   Social History   Socioeconomic History  . Marital status: Married    Spouse name: Not on file  . Number of children: Not on file  . Years of education: Not on file  . Highest education level: Not on file  Occupational History  . Not on file  Social Needs  . Financial resource strain: Not on file  . Food insecurity:    Worry: Not on file    Inability: Not on file  . Transportation needs:    Medical: Not on file    Non-medical: Not on file  Tobacco Use  . Smoking status: Never Smoker  . Smokeless tobacco: Never Used  Substance and Sexual Activity  . Alcohol use: No  Alcohol/week: 0.0 standard drinks  . Drug use: No  . Sexual activity: Never  Lifestyle  . Physical activity:    Days per week: Not on file    Minutes per session: Not on file  . Stress: Not on file  Relationships  . Social connections:    Talks on phone: Not on file    Gets together: Not on file    Attends religious service: Not on file    Active member of club or organization: Not on file    Attends meetings of clubs or organizations: Not on file    Relationship status: Not on file  Other  Topics Concern  . Not on file  Social History Narrative  . Not on file    Additional Social History: ***  Allergies:   Allergies  Allergen Reactions  . Lactose Intolerance (Gi) Other (See Comments)    Bloating and GI distress    Metabolic Disorder Labs: No results found for: HGBA1C, MPG No results found for: PROLACTIN Lab Results  Component Value Date   TRIG 176 (H) 11/09/2015     Current Medications: Current Outpatient Medications  Medication Sig Dispense Refill  . aspirin EC 81 MG tablet Take 81 mg by mouth daily.    . busPIRone (BUSPAR) 15 MG tablet Take 1 tablet (15 mg total) by mouth 4 (four) times daily. (Patient taking differently: Take 30 mg by mouth 2 (two) times daily. ) 360 tablet 0  . Cholecalciferol (HM VITAMIN D3) 4000 units CAPS Take 4,000 Units by mouth daily.     . ciclopirox (PENLAC) 8 % solution Apply to affected nail and surrounding nail bed QHS 6.6 mL 2  . clonazePAM (KLONOPIN) 0.5 MG tablet For acute anxiety, emergencies only 30 tablet 0  . Coenzyme Q-10 200 MG CAPS Take 200 mg by mouth 2 (two) times daily.     . cyanocobalamin (,VITAMIN B-12,) 1000 MCG/ML injection Inject 1,000 mcg into the muscle every 30 (thirty) days.    Marland Kitchen esomeprazole (NEXIUM) 20 MG capsule Take 20 mg by mouth 2 (two) times daily before a meal.    . Fiber, Guar Gum, CHEW Chew 5 mg by mouth daily.     . hydroxychloroquine (PLAQUENIL) 200 MG tablet Take 1 tablet by mouth twice a day Monday through Friday 120 tablet 0  . hydrOXYzine (ATARAX/VISTARIL) 50 MG tablet Take 1 tablet (50 mg total) by mouth 3 (three) times daily as needed for anxiety. 270 tablet 1  . ibuprofen (ADVIL,MOTRIN) 100 MG tablet Take 100 mg by mouth as needed for fever.    Javier Docker Oil 500 MG CAPS Take 500 mg by mouth daily.    Marland Kitchen lamoTRIgine (LAMICTAL) 150 MG tablet Take 1 tablet (150 mg total) by mouth 2 (two) times daily. 180 tablet 1  . levothyroxine (SYNTHROID, LEVOTHROID) 112 MCG tablet Take 1 tablet (112 mcg  total) by mouth daily. (Patient taking differently: Take 112 mcg by mouth daily before breakfast. ) 90 tablet 1  . loratadine (CLARITIN) 10 MG tablet Take 10 mg by mouth daily as needed for allergies.    . polyethylene glycol (MIRALAX) packet Take 17 g by mouth daily. 14 each 0  . sucralfate (CARAFATE) 1 g tablet Take 1 tablet (1 g total) by mouth 2 (two) times daily. 120 tablet 1  . tiZANidine (ZANAFLEX) 4 MG tablet Take 1 tablet (4 mg total) by mouth 3 (three) times daily as needed. (Patient taking differently: Take 4 mg by mouth 3 (three) times  daily as needed for muscle spasms. ) 90 tablet 1  . traMADol (ULTRAM) 50 MG tablet Take 1 tablet (50 mg total) by mouth every 8 (eight) hours as needed. 24 tablet 0  . traZODone (DESYREL) 100 MG tablet Take 2 tablets (200 mg total) by mouth at bedtime. (Patient taking differently: Take 100 mg by mouth at bedtime as needed and may repeat dose one time if needed for sleep. ) 180 tablet 1  . venlafaxine (EFFEXOR) 100 MG tablet Take 1 tablet (100 mg total) by mouth 3 (three) times daily with meals. 90 tablet 4  . zoledronic acid (RECLAST) 5 MG/100ML SOLN injection Inject 5 mg into the vein See admin instructions. Patient takes yearly    . desipramine (NORPRAMIN) 25 MG tablet 1  qam 30 tablet 2   No current facility-administered medications for this visit.     Neurologic: Headache: {BHH YES OR NO:22294} Seizure: {BHH YES OR NO:22294} Paresthesias:{BHH YES OR NO:22294}  Musculoskeletal: Strength & Muscle Tone: {desc; muscle tone:32375} Gait & Station: {PE GAIT ED NATL:22525} Patient leans: {Patient Leans:21022755}  Psychiatric Specialty Exam: ROS  Blood pressure (!) 144/94, pulse 91, height 5\' 5"  (1.651 m), weight (!) 333 lb (151 kg), SpO2 98 %.Body mass index is 55.41 kg/m.  General Appearance: {Appearance:22683}  Eye Contact:  {BHH EYE CONTACT:22684}  Speech:  {Speech:22685}  Volume:  {Volume (PAA):22686}  Mood:  {BHH MOOD:22306}  Affect:   {Affect (PAA):22687}  Thought Process:  {Thought Process (PAA):22688}  Orientation:  {BHH ORIENTATION (PAA):22689}  Thought Content:  {Thought Content:22690}  Suicidal Thoughts:  {ST/HT (PAA):22692}  Homicidal Thoughts:  {ST/HT (PAA):22692}  Memory:  {BHH MEMORY:22881}  Judgement:  {Judgement (PAA):22694}  Insight:  {Insight (PAA):22695}  Psychomotor Activity:  {Psychomotor (PAA):22696}  Concentration:  {Concentration:21399}  Recall:  {BHH GOOD/FAIR/POOR:22877}  Fund of Knowledge:{BHH GOOD/FAIR/POOR:22877}  Language: {BHH GOOD/FAIR/POOR:22877}  Akathisia:  {BHH YES OR NO:22294}  Handed:  {Handed:22697}  AIMS (if indicated):  ***  Assets:  {Assets (PAA):22698}  ADL's:  {BHH SAY'T:01601}  Cognition: {chl bhh cognition:304700322}  Sleep:  ***    Treatment Plan Summary: {CHL AMB BH MD TX UXNA:3557322025}   Jerral Ralph, MD 9/18/20193:33 PM

## 2018-04-17 NOTE — Progress Notes (Deleted)
Psychiatric Initial Adult Assessment   Patient Identification: Jamie Bennett MRN:  [id number] Date of Evaluation:  04/17/2018 Referral Source: *** Chief Complaint:   Visit Diagnosis:    ICD-10-CM   1. Major depression in remission ([place]) F32.5   2. Anxiety state F41.1 venlafaxine (EFFEXOR) 100 MG tablet    hydrOXYzine (ATARAX/VISTARIL) 50 MG tablet    DISCONTINUED: venlafaxine (EFFEXOR) 100 MG tablet  3. Borderline personality disorder ([place]) F60.3 venlafaxine (EFFEXOR) 100 MG tablet    DISCONTINUED: venlafaxine (EFFEXOR) 100 MG tablet  4. Major depressive disorder, recurrent, severe without psychotic features (HCC) F33.2 venlafaxine (EFFEXOR) 100 MG tablet    DISCONTINUED: venlafaxine (EFFEXOR) 100 MG tablet    History of Present Illness:  ***  Associated Signs/Symptoms: Depression Symptoms:  {DEPRESSION SYMPTOMS:20000} (Hypo) Manic Symptoms:  {BHH MANIC SYMPTOMS:22872} Anxiety Symptoms:  {BHH ANXIETY SYMPTOMS:22873} Psychotic Symptoms:  {BHH PSYCHOTIC SYMPTOMS:22874} PTSD Symptoms: {BHH PTSD SYMPTOMS:22875}  Past Psychiatric History: ***  Previous Psychotropic Medications: {YES/NO:21197}  Substance Abuse History in the last 12 months:  {yes no:314532}  Consequences of Substance Abuse: {BHH CONSEQUENCES OF SUBSTANCE ABUSE:22880}  Past Medical History:  Past Medical History:  Diagnosis Date  . Anemia   . Anxiety   . Bipolar 1 disorder ([place])   . Collagen vascular disease ([place])    rhematoid arthritis  . Constipation   . Fibromyalgia   . GERD (gastroesophageal reflux disease)   . Heart murmur    mild-asymptomatic  . Hepatic steatosis   . History of Roux-en-Y gastric bypass   . Hypotension   . Hypothyroidism   . Opioid abuse ([place])   . Osteoarthritis   . Osteoporosis   . PONV (postoperative nausea and vomiting)    nausea only  . Rheumatic fever   . Rheumatoid arthritis ([place])   . Thyroid disease     Past Surgical History:  Procedure Laterality Date  .  CHOLECYSTECTOMY  2004  . COLONOSCOPY N/A 07/16/2017   Procedure: COLONOSCOPY;  Surgeon: Manya Silvas, MD;  Location: [facility] ENDOSCOPY;  Service: Endoscopy;  Laterality: N/A;  . EXPLORATORY LAPAROTOMY  2009  . GASTRIC BYPASS  2003   [place] N/A 03/01/2018   Procedure: EXPLORATORY LAPAROTOMY/ UMBILECTOMY;  Surgeon: Robert Bellow, MD;  Location: [facility] ORS;  Service: General;  Laterality: N/A;  . TONSILLECTOMY    . VENTRAL HERNIA REPAIR N/A 03/07/2018   Procedure: HERNIA REPAIR VENTRAL ADULT;  Surgeon: Robert Bellow, MD;  Location: [facility] ORS;  Service: General;  Laterality: N/A;    Family Psychiatric History: ***  Family History:  Family History  Problem Relation Age of Onset  . Depression Mother   . Dementia Mother   . Heart disease Mother   . Parkinson's disease Father   . Colon cancer Neg Hx     Social History:   Social History   Socioeconomic History  . Marital status: Married    Spouse name: Not on file  . Number of children: Not on file  . Years of education: Not on file  . Highest education level: Not on file  Occupational History  . Not on file  Social Needs  . Financial resource strain: Not on file  . Food insecurity:    Worry: Not on file    Inability: Not on file  . Transportation needs:    Medical: Not on file    Non-medical: Not on file  Tobacco Use  . Smoking status: Never Smoker  . Smokeless tobacco: Never Used  Substance and Sexual Activity  . Alcohol use: No    Alcohol/week: 0.0 standard drinks  . Drug use: No  . Sexual activity: Never  Lifestyle  . Physical activity:    Days per week: Not on file    Minutes per session: Not on file  . Stress: Not on file  Relationships  . Social connections:    Talks on phone: Not on file    Gets together: Not on file    Attends religious service: Not on file    Active member of club or organization: Not on file    Attends meetings of clubs or organizations: Not on file     Relationship status: Not on file  Other Topics Concern  . Not on file  Social History Narrative  . Not on file    Additional Social History: ***  Allergies:   Allergies  Allergen Reactions  . Lactose Intolerance (Gi) Other (See Comments)    Bloating and GI distress    Metabolic Disorder Labs: No results found for: HGBA1C, MPG No results found for: PROLACTIN Lab Results  Component Value Date   TRIG 176 (H) 11/09/2015     Current Medications: Current Outpatient Medications  Medication Sig Dispense Refill  . aspirin EC 81 MG tablet Take 81 mg by mouth daily.    . busPIRone (BUSPAR) 15 MG tablet Take 1 tablet (15 mg total) by mouth 4 (four) times daily. (Patient taking differently: Take 30 mg by mouth 2 (two) times daily. ) 360 tablet 0  . Cholecalciferol (HM VITAMIN D3) 4000 units CAPS Take 4,000 Units by mouth daily.     . ciclopirox (PENLAC) 8 % solution Apply to affected nail and surrounding nail bed QHS 6.6 mL 2  . clonazePAM (KLONOPIN) 0.5 MG tablet For acute anxiety, emergencies only 30 tablet 0  . Coenzyme Q-10 200 MG CAPS Take 200 mg by mouth 2 (two) times daily.     . cyanocobalamin (,VITAMIN B-12,) 1000 MCG/ML injection Inject 1,000 mcg into the muscle every 30 (thirty) days.    Marland Kitchen esomeprazole (NEXIUM) 20 MG capsule Take 20 mg by mouth 2 (two) times daily before a meal.    . Fiber, Guar Gum, CHEW Chew 5 mg by mouth daily.     . hydroxychloroquine (PLAQUENIL) 200 MG tablet Take 1 tablet by mouth twice a day Monday through Friday 120 tablet 0  . hydrOXYzine (ATARAX/VISTARIL) 50 MG tablet Take 1 tablet (50 mg total) by mouth 3 (three) times daily as needed for anxiety. 270 tablet 1  . ibuprofen (ADVIL,MOTRIN) 100 MG tablet Take 100 mg by mouth as needed for fever.    Javier Docker Oil 500 MG CAPS Take 500 mg by mouth daily.    Marland Kitchen lamoTRIgine (LAMICTAL) 150 MG tablet Take 1 tablet (150 mg total) by mouth 2 (two) times daily. 180 tablet 1  . levothyroxine (SYNTHROID, LEVOTHROID)  112 MCG tablet Take 1 tablet (112 mcg total) by mouth daily. (Patient taking differently: Take 112 mcg by mouth daily before breakfast. ) 90 tablet 1  . loratadine (CLARITIN) 10 MG tablet Take 10 mg by mouth daily as needed for allergies.    . polyethylene glycol (MIRALAX) packet Take 17 g by mouth daily. 14 each 0  . sucralfate (CARAFATE) 1 g tablet Take 1 tablet (1 g total) by mouth 2 (two) times daily. 120 tablet 1  . tiZANidine (ZANAFLEX) 4 MG tablet Take 1 tablet (4 mg total) by mouth 3 (three) times daily as  needed. (Patient taking differently: Take 4 mg by mouth 3 (three) times daily as needed for muscle spasms. ) 90 tablet 1  . traMADol (ULTRAM) 50 MG tablet Take 1 tablet (50 mg total) by mouth every 8 (eight) hours as needed. 24 tablet 0  . traZODone (DESYREL) 100 MG tablet Take 2 tablets (200 mg total) by mouth at bedtime. (Patient taking differently: Take 100 mg by mouth at bedtime as needed and may repeat dose one time if needed for sleep. ) 180 tablet 1  . venlafaxine (EFFEXOR) 100 MG tablet Take 1 tablet (100 mg total) by mouth 3 (three) times daily with meals. 90 tablet 4  . zoledronic acid (RECLAST) 5 MG/100ML SOLN injection Inject 5 mg into the vein See admin instructions. Patient takes yearly    . desipramine (NORPRAMIN) 25 MG tablet 1  qam 30 tablet 2   No current facility-administered medications for this visit.     Neurologic: Headache: {BHH YES OR NO:22294} Seizure: {BHH YES OR NO:22294} Paresthesias:{BHH YES OR NO:22294}  Musculoskeletal: Strength & Muscle Tone: {desc; muscle tone:32375} Gait & Station: {PE GAIT ED NATL:22525} Patient leans: {Patient Leans:21022755}  Psychiatric Specialty Exam: ROS  Blood pressure (!) 144/94, pulse 91, height 5\' 5"  (1.651 m), weight (!) 333 lb (151 kg), SpO2 98 %.Body mass index is 55.41 kg/m.  General Appearance: {Appearance:22683}  Eye Contact:  {BHH EYE CONTACT:22684}  Speech:  {Speech:22685}  Volume:  {Volume (PAA):22686}   Mood:  {BHH MOOD:22306}  Affect:  {Affect (PAA):22687}  Thought Process:  {Thought Process (PAA):22688}  Orientation:  {BHH ORIENTATION (PAA):22689}  Thought Content:  {Thought Content:22690}  Suicidal Thoughts:  {ST/HT (PAA):22692}  Homicidal Thoughts:  {ST/HT (PAA):22692}  Memory:  {BHH MEMORY:22881}  Judgement:  {Judgement (PAA):22694}  Insight:  {Insight (PAA):22695}  Psychomotor Activity:  {Psychomotor (PAA):22696}  Concentration:  {Concentration:21399}  Recall:  {BHH GOOD/FAIR/POOR:22877}  Fund of Knowledge:{BHH GOOD/FAIR/POOR:22877}  Language: {BHH GOOD/FAIR/POOR:22877}  Akathisia:  {BHH YES OR NO:22294}  Handed:  {Handed:22697}  AIMS (if indicated):  ***  Assets:  {Assets (PAA):22698}  ADL's:  {BHH KCL'E:75170}  Cognition: {chl bhh cognition:304700322}  Sleep:  ***    Treatment Plan Summary: {CHL AMB BH MD TX YFVC:[id number]}   Jerral Ralph, MD 9/18/20193:56 PM

## 2018-04-17 NOTE — Progress Notes (Deleted)
Opened in error

## 2018-04-17 NOTE — Progress Notes (Deleted)
Psychiatric Initial Adult Assessment   Patient Identification: Jamie Bennett MRN:  [id number] Date of Evaluation:  04/17/2018 Referral Source:  Chief Complaint:   Visit Diagnosis:    ICD-10-CM   1. Anxiety state F41.1 venlafaxine (EFFEXOR) 100 MG tablet    hydrOXYzine (ATARAX/VISTARIL) 50 MG tablet    DISCONTINUED: venlafaxine (EFFEXOR) 100 MG tablet  2. Borderline personality disorder ([place]) F60.3 venlafaxine (EFFEXOR) 100 MG tablet    DISCONTINUED: venlafaxine (EFFEXOR) 100 MG tablet  3. Major depressive disorder, recurrent, severe without psychotic features ([place]) F33.2 venlafaxine (EFFEXOR) 100 MG tablet    DISCONTINUED: venlafaxine (EFFEXOR) 100 MG tablet    History of Present Illness:    Associated Signs/Symptoms: Depression Symptoms: energy loss (Hypo) Manic Symptoms:   Anxiety Symptoms:   Psychotic Symptoms:   PTSD Symptoms:   Past Psychiatric History: 10 psychiatric hospitalizations  Previous Psychotropic Medications:   Substance Abuse History in the last 12 months:  Yes.    Consequences of Substance Abuse:   Past Medical History:  Past Medical History:  Diagnosis Date  . Anemia   . Anxiety   . Bipolar 1 disorder ([place])   . Collagen vascular disease ([place])    rhematoid arthritis  . Constipation   . Fibromyalgia   . GERD (gastroesophageal reflux disease)   . Heart murmur    mild-asymptomatic  . Hepatic steatosis   . History of Roux-en-Y gastric bypass   . Hypotension   . Hypothyroidism   . Opioid abuse ([place])   . Osteoarthritis   . Osteoporosis   . PONV (postoperative nausea and vomiting)    nausea only  . Rheumatic fever   . Rheumatoid arthritis ([place])   . Thyroid disease     Past Surgical History:  Procedure Laterality Date  . CHOLECYSTECTOMY  2004  . COLONOSCOPY N/A 07/16/2017   Procedure: COLONOSCOPY;  Surgeon: Manya Silvas, MD;  Location: [facility] ENDOSCOPY;  Service: Endoscopy;  Laterality: N/A;  . EXPLORATORY LAPAROTOMY  2009  .  GASTRIC BYPASS  2003   [place] N/A 03/01/2018   Procedure: EXPLORATORY LAPAROTOMY/ UMBILECTOMY;  Surgeon: Robert Bellow, MD;  Location: [facility] ORS;  Service: General;  Laterality: N/A;  . TONSILLECTOMY    . VENTRAL HERNIA REPAIR N/A 03/07/2018   Procedure: HERNIA REPAIR VENTRAL ADULT;  Surgeon: Robert Bellow, MD;  Location: [facility] ORS;  Service: General;  Laterality: N/A;    Family Psychiatric History:   Family History:  Family History  Problem Relation Age of Onset  . Depression Mother   . Dementia Mother   . Heart disease Mother   . Parkinson's disease Father   . Colon cancer Neg Hx     Social History:   Social History   Socioeconomic History  . Marital status: Married    Spouse name: Not on file  . Number of children: Not on file  . Years of education: Not on file  . Highest education level: Not on file  Occupational History  . Not on file  Social Needs  . Financial resource strain: Not on file  . Food insecurity:    Worry: Not on file    Inability: Not on file  . Transportation needs:    Medical: Not on file    Non-medical: Not on file  Tobacco Use  . Smoking status: Never Smoker  . Smokeless tobacco: Never Used  Substance and Sexual Activity  . Alcohol use: No    Alcohol/week: 0.0 standard drinks  . Drug  use: No  . Sexual activity: Never  Lifestyle  . Physical activity:    Days per week: Not on file    Minutes per session: Not on file  . Stress: Not on file  Relationships  . Social connections:    Talks on phone: Not on file    Gets together: Not on file    Attends religious service: Not on file    Active member of club or organization: Not on file    Attends meetings of clubs or organizations: Not on file    Relationship status: Not on file  Other Topics Concern  . Not on file  Social History Narrative  . Not on file    Additional Social History:   Allergies:   Allergies  Allergen Reactions  . Lactose Intolerance  (Gi) Other (See Comments)    Bloating and GI distress    Metabolic Disorder Labs: No results found for: HGBA1C, MPG No results found for: PROLACTIN Lab Results  Component Value Date   TRIG 176 (H) 11/09/2015     Current Medications: Current Outpatient Medications  Medication Sig Dispense Refill  . aspirin EC 81 MG tablet Take 81 mg by mouth daily.    . busPIRone (BUSPAR) 15 MG tablet Take 1 tablet (15 mg total) by mouth 4 (four) times daily. (Patient taking differently: Take 30 mg by mouth 2 (two) times daily. ) 360 tablet 0  . Cholecalciferol (HM VITAMIN D3) 4000 units CAPS Take 4,000 Units by mouth daily.     . ciclopirox (PENLAC) 8 % solution Apply to affected nail and surrounding nail bed QHS 6.6 mL 2  . clonazePAM (KLONOPIN) 0.5 MG tablet For acute anxiety, emergencies only 30 tablet 0  . Coenzyme Q-10 200 MG CAPS Take 200 mg by mouth 2 (two) times daily.     . cyanocobalamin (,VITAMIN B-12,) 1000 MCG/ML injection Inject 1,000 mcg into the muscle every 30 (thirty) days.    Marland Kitchen esomeprazole (NEXIUM) 20 MG capsule Take 20 mg by mouth 2 (two) times daily before a meal.    . Fiber, Guar Gum, CHEW Chew 5 mg by mouth daily.     . hydroxychloroquine (PLAQUENIL) 200 MG tablet Take 1 tablet by mouth twice a day Monday through Friday 120 tablet 0  . hydrOXYzine (ATARAX/VISTARIL) 50 MG tablet Take 1 tablet (50 mg total) by mouth 3 (three) times daily as needed for anxiety. 270 tablet 1  . ibuprofen (ADVIL,MOTRIN) 100 MG tablet Take 100 mg by mouth as needed for fever.    Javier Docker Oil 500 MG CAPS Take 500 mg by mouth daily.    Marland Kitchen lamoTRIgine (LAMICTAL) 150 MG tablet Take 1 tablet (150 mg total) by mouth 2 (two) times daily. 180 tablet 1  . levothyroxine (SYNTHROID, LEVOTHROID) 112 MCG tablet Take 1 tablet (112 mcg total) by mouth daily. (Patient taking differently: Take 112 mcg by mouth daily before breakfast. ) 90 tablet 1  . loratadine (CLARITIN) 10 MG tablet Take 10 mg by mouth daily as needed  for allergies.    . polyethylene glycol (MIRALAX) packet Take 17 g by mouth daily. 14 each 0  . sucralfate (CARAFATE) 1 g tablet Take 1 tablet (1 g total) by mouth 2 (two) times daily. 120 tablet 1  . tiZANidine (ZANAFLEX) 4 MG tablet Take 1 tablet (4 mg total) by mouth 3 (three) times daily as needed. (Patient taking differently: Take 4 mg by mouth 3 (three) times daily as needed for muscle spasms. )  90 tablet 1  . traMADol (ULTRAM) 50 MG tablet Take 1 tablet (50 mg total) by mouth every 8 (eight) hours as needed. 24 tablet 0  . traZODone (DESYREL) 100 MG tablet Take 2 tablets (200 mg total) by mouth at bedtime. (Patient taking differently: Take 100 mg by mouth at bedtime as needed and may repeat dose one time if needed for sleep. ) 180 tablet 1  . venlafaxine (EFFEXOR) 100 MG tablet Take 1 tablet (100 mg total) by mouth 3 (three) times daily with meals. 90 tablet 4  . zoledronic acid (RECLAST) 5 MG/100ML SOLN injection Inject 5 mg into the vein See admin instructions. Patient takes yearly    . desipramine (NORPRAMIN) 25 MG tablet 1  qam 30 tablet 2   No current facility-administered medications for this visit.     Neurologic: Headache: No Seizure: No Paresthesias:No  Musculoskeletal: Strength & Muscle Tone: {desc; muscle tone:32375} Gait & Station: normal Patient leans: Backward and N/A  Psychiatric Specialty Exam: ROS  Blood pressure (!) 144/94, pulse 91, height 5\' 5"  (1.651 m), weight (!) 333 lb (151 kg), SpO2 98 %.Body mass index is 55.41 kg/m.  General Appearance: NA  Eye Contact:  Good  Speech:  Clear and Coherent  Volume:  Normal  Mood:  NA  Affect:  Appropriate  Thought Process:  Goal Directed  Orientation:  NA  Thought Content:  Logical  Suicidal Thoughts:  No  Homicidal Thoughts:  No  Memory:  Negative  Judgement:  Good  Insight:  Lacking  Psychomotor Activity:  Normal  Concentration:  {Concentration:21399}  Recall:  Good  Fund of Knowledge:Fair  Language: NA   Akathisia:  No  Handed:  Right  AIMS (if indicated):  ***  Assets:    ADL's:    Cognition: WNL  Sleep:      Treatment Plan Summary:    Jerral Ralph, MD 9/18/20193:33 PM

## 2018-04-17 NOTE — Progress Notes (Deleted)
Psychiatric Initial Adult Assessment   Patient Identification: Jamie Bennett MRN:  [id number] Date of Evaluation:  04/17/2018 Referral Source: *** Chief Complaint:   Visit Diagnosis:    ICD-10-CM   1. Major depression in remission ([place]) F32.5   2. Anxiety state F41.1 venlafaxine (EFFEXOR) 100 MG tablet    hydrOXYzine (ATARAX/VISTARIL) 50 MG tablet    DISCONTINUED: venlafaxine (EFFEXOR) 100 MG tablet  3. Borderline personality disorder ([place]) F60.3 venlafaxine (EFFEXOR) 100 MG tablet    DISCONTINUED: venlafaxine (EFFEXOR) 100 MG tablet  4. Major depressive disorder, recurrent, severe without psychotic features ([facility]) F33.2 venlafaxine (EFFEXOR) 100 MG tablet    DISCONTINUED: venlafaxine (EFFEXOR) 100 MG tablet    History of Present Illness:  ***  Associated Signs/Symptoms: Depression Symptoms:  {DEPRESSION SYMPTOMS:20000} (Hypo) Manic Symptoms:  {BHH MANIC SYMPTOMS:22872} Anxiety Symptoms:  {BHH ANXIETY SYMPTOMS:22873} Psychotic Symptoms:  {BHH PSYCHOTIC SYMPTOMS:22874} PTSD Symptoms: {BHH PTSD SYMPTOMS:22875}  Past Psychiatric History: ***  Previous Psychotropic Medications: {YES/NO:21197}  Substance Abuse History in the last 12 months:  {yes no:314532}  Consequences of Substance Abuse: {BHH CONSEQUENCES OF SUBSTANCE ABUSE:22880}  Past Medical History:  Past Medical History:  Diagnosis Date  . Anemia   . Anxiety   . Bipolar 1 disorder ([place])   . Collagen vascular disease ([place])    rhematoid arthritis  . Constipation   . Fibromyalgia   . GERD (gastroesophageal reflux disease)   . Heart murmur    mild-asymptomatic  . Hepatic steatosis   . History of Roux-en-Y gastric bypass   . Hypotension   . Hypothyroidism   . Opioid abuse ([place])   . Osteoarthritis   . Osteoporosis   . PONV (postoperative nausea and vomiting)    nausea only  . Rheumatic fever   . Rheumatoid arthritis ([place])   . Thyroid disease     Past Surgical History:  Procedure Laterality Date  .  CHOLECYSTECTOMY  2004  . COLONOSCOPY N/A 07/16/2017   Procedure: COLONOSCOPY;  Surgeon: Manya Silvas, MD;  Location: [facility] ENDOSCOPY;  Service: Endoscopy;  Laterality: N/A;  . EXPLORATORY LAPAROTOMY  2009  . GASTRIC BYPASS  2003   [place] N/A 03/01/2018   Procedure: EXPLORATORY LAPAROTOMY/ UMBILECTOMY;  Surgeon: Robert Bellow, MD;  Location: [facility] ORS;  Service: General;  Laterality: N/A;  . TONSILLECTOMY    . VENTRAL HERNIA REPAIR N/A 03/07/2018   Procedure: HERNIA REPAIR VENTRAL ADULT;  Surgeon: Robert Bellow, MD;  Location: [facility] ORS;  Service: General;  Laterality: N/A;    Family Psychiatric History: ***  Family History:  Family History  Problem Relation Age of Onset  . Depression Mother   . Dementia Mother   . Heart disease Mother   . Parkinson's disease Father   . Colon cancer Neg Hx     Social History:   Social History   Socioeconomic History  . Marital status: Married    Spouse name: Not on file  . Number of children: Not on file  . Years of education: Not on file  . Highest education level: Not on file  Occupational History  . Not on file  Social Needs  . Financial resource strain: Not on file  . Food insecurity:    Worry: Not on file    Inability: Not on file  . Transportation needs:    Medical: Not on file    Non-medical: Not on file  Tobacco Use  . Smoking status: Never Smoker  . Smokeless tobacco: Never Used  Substance and Sexual Activity  . Alcohol use: No    Alcohol/week: 0.0 standard drinks  . Drug use: No  . Sexual activity: Never  Lifestyle  . Physical activity:    Days per week: Not on file    Minutes per session: Not on file  . Stress: Not on file  Relationships  . Social connections:    Talks on phone: Not on file    Gets together: Not on file    Attends religious service: Not on file    Active member of club or organization: Not on file    Attends meetings of clubs or organizations: Not on file     Relationship status: Not on file  Other Topics Concern  . Not on file  Social History Narrative  . Not on file    Additional Social History: ***  Allergies:   Allergies  Allergen Reactions  . Lactose Intolerance (Gi) Other (See Comments)    Bloating and GI distress    Metabolic Disorder Labs: No results found for: HGBA1C, MPG No results found for: PROLACTIN Lab Results  Component Value Date   TRIG 176 (H) 11/09/2015     Current Medications: Current Outpatient Medications  Medication Sig Dispense Refill  . aspirin EC 81 MG tablet Take 81 mg by mouth daily.    . busPIRone (BUSPAR) 15 MG tablet Take 1 tablet (15 mg total) by mouth 4 (four) times daily. (Patient taking differently: Take 30 mg by mouth 2 (two) times daily. ) 360 tablet 0  . Cholecalciferol (HM VITAMIN D3) 4000 units CAPS Take 4,000 Units by mouth daily.     . ciclopirox (PENLAC) 8 % solution Apply to affected nail and surrounding nail bed QHS 6.6 mL 2  . clonazePAM (KLONOPIN) 0.5 MG tablet For acute anxiety, emergencies only 30 tablet 0  . Coenzyme Q-10 200 MG CAPS Take 200 mg by mouth 2 (two) times daily.     . cyanocobalamin (,VITAMIN B-12,) 1000 MCG/ML injection Inject 1,000 mcg into the muscle every 30 (thirty) days.    Marland Kitchen esomeprazole (NEXIUM) 20 MG capsule Take 20 mg by mouth 2 (two) times daily before a meal.    . Fiber, Guar Gum, CHEW Chew 5 mg by mouth daily.     . hydroxychloroquine (PLAQUENIL) 200 MG tablet Take 1 tablet by mouth twice a day Monday through Friday 120 tablet 0  . hydrOXYzine (ATARAX/VISTARIL) 50 MG tablet Take 1 tablet (50 mg total) by mouth 3 (three) times daily as needed for anxiety. 270 tablet 1  . ibuprofen (ADVIL,MOTRIN) 100 MG tablet Take 100 mg by mouth as needed for fever.    Javier Docker Oil 500 MG CAPS Take 500 mg by mouth daily.    Marland Kitchen lamoTRIgine (LAMICTAL) 150 MG tablet Take 1 tablet (150 mg total) by mouth 2 (two) times daily. 180 tablet 1  . levothyroxine (SYNTHROID, LEVOTHROID)  112 MCG tablet Take 1 tablet (112 mcg total) by mouth daily. (Patient taking differently: Take 112 mcg by mouth daily before breakfast. ) 90 tablet 1  . loratadine (CLARITIN) 10 MG tablet Take 10 mg by mouth daily as needed for allergies.    . polyethylene glycol (MIRALAX) packet Take 17 g by mouth daily. 14 each 0  . sucralfate (CARAFATE) 1 g tablet Take 1 tablet (1 g total) by mouth 2 (two) times daily. 120 tablet 1  . tiZANidine (ZANAFLEX) 4 MG tablet Take 1 tablet (4 mg total) by mouth 3 (three) times daily as  needed. (Patient taking differently: Take 4 mg by mouth 3 (three) times daily as needed for muscle spasms. ) 90 tablet 1  . traMADol (ULTRAM) 50 MG tablet Take 1 tablet (50 mg total) by mouth every 8 (eight) hours as needed. 24 tablet 0  . traZODone (DESYREL) 100 MG tablet Take 2 tablets (200 mg total) by mouth at bedtime. (Patient taking differently: Take 100 mg by mouth at bedtime as needed and may repeat dose one time if needed for sleep. ) 180 tablet 1  . venlafaxine (EFFEXOR) 100 MG tablet Take 1 tablet (100 mg total) by mouth 3 (three) times daily with meals. 90 tablet 4  . zoledronic acid (RECLAST) 5 MG/100ML SOLN injection Inject 5 mg into the vein See admin instructions. Patient takes yearly    . desipramine (NORPRAMIN) 25 MG tablet 1  qam 30 tablet 2   No current facility-administered medications for this visit.     Neurologic: Headache: {BHH YES OR NO:22294} Seizure: {BHH YES OR NO:22294} Paresthesias:{BHH YES OR NO:22294}  Musculoskeletal: Strength & Muscle Tone: {desc; muscle tone:32375} Gait & Station: {PE GAIT ED NATL:22525} Patient leans: {Patient Leans:21022755}  Psychiatric Specialty Exam: ROS  Blood pressure (!) 144/94, pulse 91, height 5\' 5"  (1.651 m), weight (!) 333 lb (151 kg), SpO2 98 %.Body mass index is 55.41 kg/m.  General Appearance: {Appearance:22683}  Eye Contact:  {BHH EYE CONTACT:22684}  Speech:  {Speech:22685}  Volume:  {Volume (PAA):22686}   Mood:  {BHH MOOD:22306}  Affect:  {Affect (PAA):22687}  Thought Process:  {Thought Process (PAA):22688}  Orientation:  {BHH ORIENTATION (PAA):22689}  Thought Content:  {Thought Content:22690}  Suicidal Thoughts:  {ST/HT (PAA):22692}  Homicidal Thoughts:  {ST/HT (PAA):22692}  Memory:  {BHH MEMORY:22881}  Judgement:  {Judgement (PAA):22694}  Insight:  {Insight (PAA):22695}  Psychomotor Activity:  {Psychomotor (PAA):22696}  Concentration:  {Concentration:21399}  Recall:  {BHH GOOD/FAIR/POOR:22877}  Fund of Knowledge:{BHH GOOD/FAIR/POOR:22877}  Language: {BHH GOOD/FAIR/POOR:22877}  Akathisia:  {BHH YES OR NO:22294}  Handed:  {Handed:22697}  AIMS (if indicated):  ***  Assets:  {Assets (PAA):22698}  ADL's:  {BHH BZJ'I:96789}  Cognition: {chl bhh cognition:304700322}  Sleep:  ***    Treatment Plan Summary: {CHL AMB BH MD TX FYBO:[id number]}   Jerral Ralph, MD 9/18/20193:57 PM

## 2018-04-18 ENCOUNTER — Inpatient Hospital Stay (HOSPITAL_BASED_OUTPATIENT_CLINIC_OR_DEPARTMENT_OTHER): Payer: Medicare HMO | Admitting: Hematology and Oncology

## 2018-04-18 ENCOUNTER — Other Ambulatory Visit: Payer: Self-pay

## 2018-04-18 ENCOUNTER — Inpatient Hospital Stay: Payer: Medicare HMO | Attending: Hematology and Oncology

## 2018-04-18 ENCOUNTER — Inpatient Hospital Stay: Payer: Medicare HMO

## 2018-04-18 ENCOUNTER — Encounter: Payer: Self-pay | Admitting: Hematology and Oncology

## 2018-04-18 VITALS — BP 142/90 | HR 65 | Temp 97.8°F | Resp 18 | Wt 333.8 lb

## 2018-04-18 DIAGNOSIS — E538 Deficiency of other specified B group vitamins: Secondary | ICD-10-CM

## 2018-04-18 DIAGNOSIS — D509 Iron deficiency anemia, unspecified: Secondary | ICD-10-CM

## 2018-04-18 DIAGNOSIS — Z79899 Other long term (current) drug therapy: Secondary | ICD-10-CM | POA: Insufficient documentation

## 2018-04-18 DIAGNOSIS — D508 Other iron deficiency anemias: Secondary | ICD-10-CM

## 2018-04-18 DIAGNOSIS — K9589 Other complications of other bariatric procedure: Secondary | ICD-10-CM

## 2018-04-18 LAB — IRON AND TIBC
Iron: 59 ug/dL (ref 28–170)
Saturation Ratios: 14 % (ref 10.4–31.8)
TIBC: 414 ug/dL (ref 250–450)
UIBC: 355 ug/dL

## 2018-04-18 LAB — CBC WITH DIFFERENTIAL/PLATELET
Basophils Absolute: 0 10*3/uL (ref 0–0.1)
Basophils Relative: 0 %
Eosinophils Absolute: 0.4 10*3/uL (ref 0–0.7)
Eosinophils Relative: 9 %
HCT: 32.6 % — ABNORMAL LOW (ref 35.0–47.0)
Hemoglobin: 11 g/dL — ABNORMAL LOW (ref 12.0–16.0)
Lymphocytes Relative: 39 %
Lymphs Abs: 1.8 10*3/uL (ref 1.0–3.6)
MCH: 30.1 pg (ref 26.0–34.0)
MCHC: 33.7 g/dL (ref 32.0–36.0)
MCV: 89.3 fL (ref 80.0–100.0)
Monocytes Absolute: 0.4 10*3/uL (ref 0.2–0.9)
Monocytes Relative: 9 %
Neutro Abs: 1.9 10*3/uL (ref 1.4–6.5)
Neutrophils Relative %: 43 %
Platelets: 225 10*3/uL (ref 150–440)
RBC: 3.65 MIL/uL — ABNORMAL LOW (ref 3.80–5.20)
RDW: 14.4 % (ref 11.5–14.5)
WBC: 4.6 10*3/uL (ref 3.6–11.0)

## 2018-04-18 LAB — FERRITIN: Ferritin: 18 ng/mL (ref 11–307)

## 2018-04-18 MED ORDER — CYANOCOBALAMIN 1000 MCG/ML IJ SOLN
1000.0000 ug | Freq: Once | INTRAMUSCULAR | Status: AC
  Administered 2018-04-18: 1000 ug via INTRAMUSCULAR
  Filled 2018-04-18: qty 1

## 2018-04-18 NOTE — Progress Notes (Signed)
Patient here for follow up. Pt had navel surgery about 6 weeks ago and is incision is hurting.

## 2018-04-18 NOTE — Progress Notes (Signed)
[facility] day:  04/18/18  Chief Complaint: Jamie Bennett is a 58 y.o. female status post gastric bypass surgery with iron deficiency anemia, B12 deficiency, and leukopenia who is seen for 6 month assessment.  HPI: The patient was last seen in the medical oncology clinic on 10/15/2017.  At that time, she was fatigued.  Her weight was down 3 pounds.  Exam was stable.  Counts were normal.  Ferritin was 21.  Iron saturation was 19% with a TIBC of 445.  Folate was 35.  She has received monthly B12 (10/15/2017 - 03/18/2018).  Symptomatically, patient continues to have low energy. She is 6 weeks post-op from umbilectomy.  Surgical course was complicated by development of a post-operative SBO and ventral hernia. SBO was treated with nasogastric decompression. Hernia required further surgical intervention. Patient presents today with complaints of further widening of her surgical incision. Patient was seen in follow up consult on 04/16/2018 by surgery [name], MD).   Patient denies that she has experienced any B symptoms. Patient denies bleeding; no hematochezia, melena, or gross hematuria.   Patient advises that she maintains an adequate appetite. She is eating well. Weight today is (!) 333 lb 12.8 oz (151.4 kg), which compared to her last visit to the clinic, represents a 4 pound increase.   Patient complains of pain rated 7/10 in the clinic today.   Past Medical History:  Diagnosis Date  . Anemia   . Anxiety   . Bipolar 1 disorder ([place])   . Collagen vascular disease ([place])    rhematoid arthritis  . Constipation   . Fibromyalgia   . GERD (gastroesophageal reflux disease)   . Heart murmur    mild-asymptomatic  . Hepatic steatosis   . History of Roux-en-Y gastric bypass   . Hypotension   . Hypothyroidism   . Opioid abuse ([place])   . Osteoarthritis   . Osteoporosis   . PONV (postoperative nausea and vomiting)    nausea only  . Rheumatic  fever   . Rheumatoid arthritis ([place])   . Thyroid disease     Past Surgical History:  Procedure Laterality Date  . CHOLECYSTECTOMY  2004  . COLONOSCOPY N/A 07/16/2017   Procedure: COLONOSCOPY;  Surgeon: Manya Silvas, MD;  Location: [facility] ENDOSCOPY;  Service: Endoscopy;  Laterality: N/A;  . EXPLORATORY LAPAROTOMY  2009  . GASTRIC BYPASS  2003   [place] N/A 03/01/2018   Procedure: EXPLORATORY LAPAROTOMY/ UMBILECTOMY;  Surgeon: Robert Bellow, MD;  Location: [facility] ORS;  Service: General;  Laterality: N/A;  . TONSILLECTOMY    . VENTRAL HERNIA REPAIR N/A 03/07/2018   Procedure: HERNIA REPAIR VENTRAL ADULT;  Surgeon: Robert Bellow, MD;  Location: [facility] ORS;  Service: General;  Laterality: N/A;    Family History  Problem Relation Age of Onset  . Depression Mother   . Dementia Mother   . Heart disease Mother   . Parkinson's disease Father   . Colon cancer Neg Hx     Social History:  reports that she has never smoked. She has never used smokeless tobacco. She reports that she does not drink alcohol or use drugs.  She lives in [place].  She is accompanied by her husband today.  Allergies:  Allergies  Allergen Reactions  . Lactose Intolerance (Gi) Other (See Comments)    Bloating and GI distress    Current Medications: Current Outpatient Medications  Medication Sig Dispense Refill  . aspirin  EC 81 MG tablet Take 81 mg by mouth daily.    . busPIRone (BUSPAR) 15 MG tablet Take 1 tablet (15 mg total) by mouth 4 (four) times daily. (Patient taking differently: Take 30 mg by mouth 2 (two) times daily. ) 360 tablet 0  . Cholecalciferol (HM VITAMIN D3) 4000 units CAPS Take 4,000 Units by mouth daily.     . ciclopirox (PENLAC) 8 % solution Apply to affected nail and surrounding nail bed QHS 6.6 mL 2  . clonazePAM (KLONOPIN) 0.5 MG tablet For acute anxiety, emergencies only 30 tablet 0  . Coenzyme Q-10 200 MG CAPS Take 200 mg by mouth 2 (two) times daily.      . cyanocobalamin (,VITAMIN B-12,) 1000 MCG/ML injection Inject 1,000 mcg into the muscle every 30 (thirty) days.    Marland Kitchen desipramine (NORPRAMIN) 25 MG tablet 1  qam 30 tablet 2  . esomeprazole (NEXIUM) 20 MG capsule Take 20 mg by mouth 2 (two) times daily before a meal.    . Fiber, Guar Gum, CHEW Chew 5 mg by mouth daily.     . hydroxychloroquine (PLAQUENIL) 200 MG tablet Take 1 tablet by mouth twice a day Monday through Friday 120 tablet 0  . hydrOXYzine (ATARAX/VISTARIL) 50 MG tablet Take 1 tablet (50 mg total) by mouth 3 (three) times daily as needed for anxiety. 270 tablet 1  . ibuprofen (ADVIL,MOTRIN) 100 MG tablet Take 100 mg by mouth as needed for fever.    Javier Docker Oil 500 MG CAPS Take 500 mg by mouth daily.    Marland Kitchen lamoTRIgine (LAMICTAL) 150 MG tablet Take 1 tablet (150 mg total) by mouth 2 (two) times daily. 180 tablet 1  . levothyroxine (SYNTHROID, LEVOTHROID) 112 MCG tablet Take 1 tablet (112 mcg total) by mouth daily. (Patient taking differently: Take 112 mcg by mouth daily before breakfast. ) 90 tablet 1  . loratadine (CLARITIN) 10 MG tablet Take 10 mg by mouth daily as needed for allergies.    . polyethylene glycol (MIRALAX) packet Take 17 g by mouth daily. 14 each 0  . sucralfate (CARAFATE) 1 g tablet Take 1 tablet (1 g total) by mouth 2 (two) times daily. 120 tablet 1  . tiZANidine (ZANAFLEX) 4 MG tablet Take 1 tablet (4 mg total) by mouth 3 (three) times daily as needed. (Patient taking differently: Take 4 mg by mouth 3 (three) times daily as needed for muscle spasms. ) 90 tablet 1  . traMADol (ULTRAM) 50 MG tablet Take 1 tablet (50 mg total) by mouth every 8 (eight) hours as needed. 24 tablet 0  . traZODone (DESYREL) 100 MG tablet Take 2 tablets (200 mg total) by mouth at bedtime. (Patient taking differently: Take 100 mg by mouth at bedtime as needed and may repeat dose one time if needed for sleep. ) 180 tablet 1  . venlafaxine (EFFEXOR) 100 MG tablet Take 1 tablet (100 mg total) by  mouth 3 (three) times daily with meals. 90 tablet 4  . zoledronic acid (RECLAST) 5 MG/100ML SOLN injection Inject 5 mg into the vein See admin instructions. Patient takes yearly     No current facility-administered medications for this visit.     Review of Systems:  GENERAL:  Feels "ok".   Low energy.  No fevers, sweats or weight loss.  Weight up 4 pounds. PERFORMANCE STATUS (ECOG):  1 HEENT:  No visual changes, runny nose, sore throat, mouth sores or tenderness. Lungs:  Shortness of breath with exertion.  No cough.  No hemoptysis. Cardiac:  No chest pain, palpitations, orthopnea, or PND. GI:  No nausea, vomiting, diarrhea, constipation, melena or hematochezia. GU:  No urgency, frequency, dysuria, or hematuria. Musculoskeletal:  No back pain.  Knee pain.  No muscle tenderness. Extremities:  No pain or swelling. Skin:  No rashes or skin changes. Neuro:  No headache, numbness or weakness, balance or coordination issues. Endocrine:  No diabetes.  Thyroid issues on Synthroid.  hot flashes or night sweats. Psych:  No mood changes, depression or anxiety. Pain:  Pain 7 out of 10.  Taking Tramadol 3x/day. Review of systems:  All other systems reviewed and found to be negative.   Physical Exam: Blood pressure (!) 142/90, pulse 65, temperature 97.8 F (36.6 C), temperature source Tympanic, resp. rate 18, weight (!) 333 lb 12.8 oz (151.4 kg). GENERAL:  Well developed, well nourished, heavyset woman sitting comfortably in the exam room in no acute distress. MENTAL STATUS:  Alert and oriented to person, place and time. HEAD:  Shoulder length brown hair.  Normocephalic, atraumatic, face symmetric, no Cushingoid features. EYES:  Glasses.  Brown eyes.  Pupils equal round and reactive to light and accomodation.  No conjunctivitis or scleral icterus. ENT:  Oropharynx clear without lesion.  Tongue normal. Mucous membranes moist.  RESPIRATORY:  Clear to auscultation without rales, wheezes or  rhonchi. CARDIOVASCULAR:  Regular rate and rhythm without murmur, rub or gallop. ABDOMEN:  Fully round.  Dressing lower abdomen.  Soft, non-tender, with active bowel sounds, and no appreciable hepatosplenomegaly.  No masses. SKIN:  No rashes, ulcers or lesions. EXTREMITIES: No edema, no skin discoloration or tenderness.  No palpable cords. LYMPH NODES: No palpable cervical, supraclavicular, axillary or inguinal adenopathy  NEUROLOGICAL: Unremarkable. PSYCH:  Appropriate.     Orders Only on 04/18/2018  Component Date Value Ref Range Status  . WBC 04/18/2018 4.6  3.6 - 11.0 K/uL Final  . RBC 04/18/2018 3.65* 3.80 - 5.20 MIL/uL Final  . Hemoglobin 04/18/2018 11.0* 12.0 - 16.0 g/dL Final  . HCT 04/18/2018 32.6* 35.0 - 47.0 % Final  . MCV 04/18/2018 89.3  80.0 - 100.0 fL Final  . MCH 04/18/2018 30.1  26.0 - 34.0 pg Final  . MCHC 04/18/2018 33.7  32.0 - 36.0 g/dL Final  . RDW 04/18/2018 14.4  11.5 - 14.5 % Final  . Platelets 04/18/2018 225  150 - 440 K/uL Final  . Neutrophils Relative % 04/18/2018 43  % Final  . Neutro Abs 04/18/2018 1.9  1.4 - 6.5 K/uL Final  . Lymphocytes Relative 04/18/2018 39  % Final  . Lymphs Abs 04/18/2018 1.8  1.0 - 3.6 K/uL Final  . Monocytes Relative 04/18/2018 9  % Final  . Monocytes Absolute 04/18/2018 0.4  0.2 - 0.9 K/uL Final  . Eosinophils Relative 04/18/2018 9  % Final  . Eosinophils Absolute 04/18/2018 0.4  0 - 0.7 K/uL Final  . Basophils Relative 04/18/2018 0  % Final  . Basophils Absolute 04/18/2018 0.0  0 - 0.1 K/uL Final   Performed at [facility], [address] [place], [place] [address]    Assessment:  Jamie Bennett is a 57 y.o. female female with a history of gastric bypass surgery in 2003 and subsequent B12 deficiency and iron deficiency anemia. Last colonoscopy was 7 years ago. Diet is good. She eats rich foods. She Is unable to absorb oral iron.   She received IV iron in 02/2014. She received Feraheme 510 mg on 09/15/2015  and 09/22/2015.  Ferritin has been followed: 38 on 02/05/2015, 160 on 05/14/2015, 14 on 09/13/2015, 373 on 10/13/2015, 217 on 12/21/2015, 102 on 04/24/2016, 73 on 07/10/2016, 39 on 10/09/2016, 37 on 01/01/2017, 28 on 04/12/2017, 24 on 07/12/2017, 21 on 10/15/2017, and 18 on 04/18/2018.  She receives B12 monthly (last 03/18/2018).   Folate was 34 on 04/12/2017 and 35 on 10/15/2017.   Colonoscopy on 07/16/2017 revealed no abnormalities.  The scope was unable to be passed past the hepatic flexure.  She underwent colonoscopy in [facility] in 07/2017 (no report available).  She also has a history of rheumatoid arthritis for which she received Humira in the past as well as methotrexate. Humira caused leukopenia. Methotrexate caused liver toxicity. She is currently on Plaquenil.   She was admitted from 11/08/2015 - 11/19/2015 with altered mental status secondary to an overdose of  Provigil.  Course was complicated by metabolic encephalopathy, intubation, acute renal failure requiring hemodialysis, MSSA pneumonia, Klebsiella UTI, and acute hepatic failure.  CBC at discharge included a hematocrit of 27.1, hemoglobin 9.2, MCV 95.1, platelets 103,000, and WBC 8900.    Symptomatically, she is recovering from umbilectomy and ventral hernia repair.  Hemoglobin is 11.0.  Plan: 1.  Labs today:  CBC with diff, ferritin, iron studies. 2.  Iron deficiency anemia:  Hemoglobin 11.0.  Ferritin 18.  RTC on 04/22/2018 for Feraheme. 3.  B12 deficiency:  B12 today and monthly.  Check folate periodically. 4.  RTC in 3 months for MD assessment , labs (CBC with diff, ferritin), and B12. 5.  RTC in 6 months for MD assessment , labs (CBC with diff, ferritin, iron studies), and B12.   Honor Loh, NP  04/18/2018, 10:59 AM   I saw and evaluated the patient, participating in the key portions of the service and reviewing pertinent diagnostic studies and records.  I reviewed the nurse practitioner's note and agree with  the findings and the plan.  The assessment and plan were discussed with the patient.  A few questions were asked by the patient and answered.   Lequita Asal, MD  04/18/2018, 10:59 AM

## 2018-04-19 ENCOUNTER — Other Ambulatory Visit: Payer: Self-pay

## 2018-04-19 MED ORDER — TIZANIDINE HCL 4 MG PO TABS: 4.0000 mg | ORAL_TABLET | Freq: Three times a day (TID) | ORAL | 1 refills | Status: DC | PRN

## 2018-04-22 ENCOUNTER — Telehealth: Payer: Self-pay | Admitting: *Deleted

## 2018-04-22 ENCOUNTER — Inpatient Hospital Stay: Payer: Medicare HMO

## 2018-04-22 ENCOUNTER — Other Ambulatory Visit: Payer: Self-pay | Admitting: Urgent Care

## 2018-04-22 VITALS — BP 122/84 | HR 70 | Resp 18

## 2018-04-22 DIAGNOSIS — D509 Iron deficiency anemia, unspecified: Secondary | ICD-10-CM | POA: Diagnosis not present

## 2018-04-22 DIAGNOSIS — E538 Deficiency of other specified B group vitamins: Secondary | ICD-10-CM

## 2018-04-22 DIAGNOSIS — Z79899 Other long term (current) drug therapy: Secondary | ICD-10-CM | POA: Diagnosis not present

## 2018-04-22 MED ORDER — SODIUM CHLORIDE 0.9 % IV SOLN
Freq: Once | INTRAVENOUS | Status: AC
  Administered 2018-04-22: 12:00:00 via INTRAVENOUS
  Filled 2018-04-22: qty 250

## 2018-04-22 MED ORDER — SODIUM CHLORIDE 0.9 % IV SOLN
510.0000 mg | Freq: Once | INTRAVENOUS | Status: AC
  Administered 2018-04-22: 510 mg via INTRAVENOUS
  Filled 2018-04-22: qty 17

## 2018-04-22 NOTE — Telephone Encounter (Signed)
Jamie Bennett from the wound care called regarding Jamie Bennett, she stated that she needs Dr.Byrnett to release the patient to their care. Lorraine Lax number is [phone]

## 2018-04-23 NOTE — Telephone Encounter (Signed)
I talked with Jamie Bennett from [facility] and the patient PCP [name] NP referred pt to wound clinic. Since this is a postop wound they need ok from Dr [name] to see pt (pt in global period). They are aware Dr [name] out of town and will ask once he returns. Patient appointment is for 05-06-18.

## 2018-04-24 ENCOUNTER — Ambulatory Visit: Payer: Self-pay | Admitting: Internal Medicine

## 2018-04-24 ENCOUNTER — Ambulatory Visit
Admission: RE | Admit: 2018-04-24 | Discharge: 2018-04-24 | Disposition: A | Discharge status: Home / Self Care | Payer: Medicare HMO | Source: Ambulatory Visit | Attending: General Surgery | Admitting: General Surgery

## 2018-04-24 DIAGNOSIS — Z9889 Other specified postprocedural states: Secondary | ICD-10-CM | POA: Insufficient documentation

## 2018-04-24 DIAGNOSIS — Q644 Malformation of urachus: Secondary | ICD-10-CM

## 2018-04-24 DIAGNOSIS — K436 Other and unspecified ventral hernia with obstruction, without gangrene: Secondary | ICD-10-CM | POA: Diagnosis present

## 2018-04-24 DIAGNOSIS — R109 Unspecified abdominal pain: Secondary | ICD-10-CM | POA: Diagnosis not present

## 2018-04-24 LAB — POCT I-STAT CREATININE: Creatinine, Ser: 0.9 mg/dL (ref 0.44–1.00)

## 2018-04-24 MED ORDER — IOPAMIDOL (ISOVUE-300) INJECTION 61%
100.0000 mL | Freq: Once | INTRAVENOUS | Status: AC | PRN
  Administered 2018-04-24: 100 mL via INTRAVENOUS

## 2018-04-29 ENCOUNTER — Other Ambulatory Visit: Payer: Self-pay | Admitting: General Surgery

## 2018-04-29 ENCOUNTER — Other Ambulatory Visit: Payer: Self-pay

## 2018-04-29 MED ORDER — TRAMADOL HCL 50 MG PO TABS: 50.0000 mg | ORAL_TABLET | Freq: Four times a day (QID) | ORAL | 0 refills | Status: DC | PRN

## 2018-04-29 MED ORDER — LEVOTHYROXINE SODIUM 112 MCG PO TABS: 112.0000 ug | ORAL_TABLET | Freq: Every day | ORAL | 1 refills | Status: DC

## 2018-04-29 NOTE — Progress Notes (Signed)
CT reviewed. Deep seroma. Will review films w/ radiology as to whether this can undergo CT/ ultrasound drain placement.  Travelling to [place] to place her elderly parents in assisted living this week, requested refill of tramadol (which has worked fairly well, not quite as good as hydrocodone). Sent.  Wound by husbands report had developed an eraser sized hole. No drainage.   Will contact her tomorrow after CT review.  She will be out of town 10/3-10/8.

## 2018-04-30 ENCOUNTER — Telehealth: Payer: Self-pay | Admitting: *Deleted

## 2018-04-30 ENCOUNTER — Other Ambulatory Visit: Payer: Self-pay | Admitting: General Surgery

## 2018-04-30 DIAGNOSIS — F603 Borderline personality disorder: Secondary | ICD-10-CM | POA: Diagnosis not present

## 2018-04-30 DIAGNOSIS — R69 Illness, unspecified: Secondary | ICD-10-CM | POA: Diagnosis not present

## 2018-04-30 DIAGNOSIS — S301XXA Contusion of abdominal wall, initial encounter: Secondary | ICD-10-CM

## 2018-04-30 DIAGNOSIS — R188 Other ascites: Secondary | ICD-10-CM

## 2018-04-30 DIAGNOSIS — F4010 Social phobia, unspecified: Secondary | ICD-10-CM | POA: Diagnosis not present

## 2018-04-30 NOTE — Telephone Encounter (Signed)
Jamie Bennett from the wound care wants you to call her when you get a chance [phone]. They need a letter stating that Dr [name] releases wound care responsibilities to them and the location of the wound.

## 2018-04-30 NOTE — Progress Notes (Signed)
Thank you so much for the update.

## 2018-04-30 NOTE — Telephone Encounter (Signed)
I talked with Jamie Bennett from [facility] and the patient PCP [name] NP referred pt to wound clinic. Since this is a postop wound they need ok from Dr [name] to see pt (pt in global period). They are aware Dr [name] out of town and will ask once he returns. Patient appointment is for 05-06-18. Per Dr [name] "ok" to treat wound. When I talked with Jamie Bennett she wanted a letter "releasing her from his care", but that is not possible because he is still treating her. She will talk to [facility] and call us back.

## 2018-04-30 NOTE — Telephone Encounter (Signed)
-----   Message from Robert Bellow, MD sent at 04/30/2018  9:10 AM EDT ----- Reviewed with radiologist: Recommend aspiration (Drawing fluid out) without drain. To be done with ultrasound.  That is a good starting point and may resolve issue.   Can be scheduled after her return from [place].  ----- Message ----- From: Interface, Rad Results In Sent: 04/24/2018   5:01 PM EDT To: Robert Bellow, MD

## 2018-05-01 NOTE — Telephone Encounter (Signed)
Reviewed with radiologist: Recommend aspiration (Drawing fluid out) without drain. To be done with ultrasound.  That is a good starting point and may resolve issue.   Can be scheduled after her return from [place].  ----- Message ----- From: Interface, Rad Results In Sent: 04/24/2018   5:01 PM EDT To: Robert Bellow, MD

## 2018-05-01 NOTE — Telephone Encounter (Signed)
Letter faxed to [facility] wound [facility] as requested.

## 2018-05-01 NOTE — Progress Notes (Signed)
Great! Please keep me posted

## 2018-05-06 ENCOUNTER — Ambulatory Visit: Payer: Self-pay | Admitting: Physician Assistant

## 2018-05-06 NOTE — Telephone Encounter (Signed)
Patient scheduled for ultrasound guided needle aspiration of fluid in the abdomen. They will sent the fluid for culture. She will report to the registration desk in the medical mall at [facility] on 05/10/18 at 1:30 pm. The patient is aware of date, time, and instructions.

## 2018-05-08 ENCOUNTER — Other Ambulatory Visit (HOSPITAL_COMMUNITY): Payer: Self-pay

## 2018-05-08 ENCOUNTER — Telehealth: Payer: Self-pay | Admitting: *Deleted

## 2018-05-08 DIAGNOSIS — F603 Borderline personality disorder: Secondary | ICD-10-CM

## 2018-05-08 DIAGNOSIS — F332 Major depressive disorder, recurrent severe without psychotic features: Secondary | ICD-10-CM

## 2018-05-08 MED ORDER — TRAMADOL HCL 50 MG PO TABS: 50.0000 mg | ORAL_TABLET | Freq: Two times a day (BID) | ORAL | 0 refills | Status: DC | PRN

## 2018-05-08 MED ORDER — TRAZODONE HCL 100 MG PO TABS: 100.0000 mg | ORAL_TABLET | Freq: Every evening | ORAL | 2 refills | Status: DC | PRN

## 2018-05-08 NOTE — Telephone Encounter (Signed)
Will renew Ultram one additional time pending aspiration of abdominal wall seroma.

## 2018-05-08 NOTE — Telephone Encounter (Signed)
Patient called and is requesting to get a refill on tramadol for her pain. Please call and advise

## 2018-05-09 ENCOUNTER — Other Ambulatory Visit: Payer: Self-pay | Admitting: Student

## 2018-05-09 ENCOUNTER — Other Ambulatory Visit: Payer: Self-pay

## 2018-05-09 MED ORDER — LEVOTHYROXINE SODIUM 112 MCG PO TABS: 112.0000 ug | ORAL_TABLET | Freq: Every day | ORAL | 1 refills | Status: DC

## 2018-05-09 NOTE — Telephone Encounter (Signed)
Rx called in and patient aware to pick up.

## 2018-05-10 ENCOUNTER — Ambulatory Visit
Admission: RE | Admit: 2018-05-10 | Discharge: 2018-05-10 | Disposition: A | Discharge status: Home / Self Care | Payer: Medicare HMO | Source: Ambulatory Visit | Attending: General Surgery | Admitting: General Surgery

## 2018-05-10 ENCOUNTER — Other Ambulatory Visit: Payer: Self-pay | Admitting: Radiology

## 2018-05-10 DIAGNOSIS — L7634 Postprocedural seroma of skin and subcutaneous tissue following other procedure: Secondary | ICD-10-CM | POA: Insufficient documentation

## 2018-05-10 DIAGNOSIS — S301XXA Contusion of abdominal wall, initial encounter: Secondary | ICD-10-CM

## 2018-05-10 DIAGNOSIS — R188 Other ascites: Secondary | ICD-10-CM

## 2018-05-10 DIAGNOSIS — L7632 Postprocedural hematoma of skin and subcutaneous tissue following other procedure: Secondary | ICD-10-CM | POA: Diagnosis not present

## 2018-05-10 MED ORDER — HYDROCODONE-ACETAMINOPHEN 5-325 MG PO TABS: 1.0000 | ORAL_TABLET | ORAL | Status: DC | PRN

## 2018-05-10 NOTE — Procedures (Signed)
  Procedure: US aspiration ant abd wall seroma 286ml, sample for culture EBL:   minimal Complications:  none immediate  See full dictation in [facility].  Dillard Cannon MD Main # [phone] Pager  [phone]

## 2018-05-14 LAB — ACID FAST SMEAR (AFB, MYCOBACTERIA): Acid Fast Smear: NEGATIVE

## 2018-05-15 ENCOUNTER — Telehealth: Payer: Self-pay

## 2018-05-15 LAB — AEROBIC/ANAEROBIC CULTURE (SURGICAL/DEEP WOUND): Culture: NO GROWTH

## 2018-05-15 LAB — AEROBIC/ANAEROBIC CULTURE W GRAM STAIN (SURGICAL/DEEP WOUND)

## 2018-05-15 NOTE — Telephone Encounter (Signed)
-----   Message from Robert Bellow, MD sent at 05/15/2018  2:17 PM EDT ----- Please notify the patient the culture results were negative. No evidence of infection.  She should be scheduled for a f/u visit the week of Oct 28th. Thanks.  ----- Message ----- From: Buel Ream, [facility] [place] Sent: 05/14/2018   9:35 PM EDT To: Robert Bellow, MD

## 2018-05-15 NOTE — Telephone Encounter (Signed)
Notified patient as instructed, patient pleased. Discussed follow-up appointments, patient agrees  

## 2018-05-16 ENCOUNTER — Inpatient Hospital Stay: Payer: Medicare HMO | Attending: Hematology and Oncology

## 2018-05-16 DIAGNOSIS — E538 Deficiency of other specified B group vitamins: Secondary | ICD-10-CM | POA: Insufficient documentation

## 2018-05-16 DIAGNOSIS — Z79899 Other long term (current) drug therapy: Secondary | ICD-10-CM | POA: Diagnosis not present

## 2018-05-16 MED ORDER — CYANOCOBALAMIN 1000 MCG/ML IJ SOLN
1000.0000 ug | Freq: Once | INTRAMUSCULAR | Status: AC
  Administered 2018-05-16: 1000 ug via INTRAMUSCULAR

## 2018-05-21 DIAGNOSIS — R69 Illness, unspecified: Secondary | ICD-10-CM | POA: Diagnosis not present

## 2018-05-21 DIAGNOSIS — F603 Borderline personality disorder: Secondary | ICD-10-CM | POA: Diagnosis not present

## 2018-05-21 DIAGNOSIS — F4010 Social phobia, unspecified: Secondary | ICD-10-CM | POA: Diagnosis not present

## 2018-05-27 ENCOUNTER — Ambulatory Visit (INDEPENDENT_AMBULATORY_CARE_PROVIDER_SITE_OTHER): Payer: Medicare HMO | Admitting: Nurse Practitioner

## 2018-05-27 ENCOUNTER — Encounter: Payer: Self-pay | Admitting: Nurse Practitioner

## 2018-05-27 VITALS — BP 128/88 | HR 82 | Resp 16 | Ht 65.0 in | Wt 318.4 lb

## 2018-05-27 DIAGNOSIS — E039 Hypothyroidism, unspecified: Secondary | ICD-10-CM | POA: Diagnosis not present

## 2018-05-27 DIAGNOSIS — Z124 Encounter for screening for malignant neoplasm of cervix: Secondary | ICD-10-CM

## 2018-05-27 DIAGNOSIS — Z1239 Encounter for other screening for malignant neoplasm of breast: Secondary | ICD-10-CM

## 2018-05-27 DIAGNOSIS — T8131XA Disruption of external operation (surgical) wound, not elsewhere classified, initial encounter: Secondary | ICD-10-CM

## 2018-05-27 DIAGNOSIS — Z0001 Encounter for general adult medical examination with abnormal findings: Secondary | ICD-10-CM

## 2018-05-27 DIAGNOSIS — R300 Dysuria: Secondary | ICD-10-CM

## 2018-05-27 DIAGNOSIS — K269 Duodenal ulcer, unspecified as acute or chronic, without hemorrhage or perforation: Secondary | ICD-10-CM

## 2018-05-27 MED ORDER — SUCRALFATE 1 G PO TABS: 1.0000 g | ORAL_TABLET | Freq: Two times a day (BID) | ORAL | 3 refills | Status: DC

## 2018-05-27 NOTE — Progress Notes (Signed)
[facility] [place], [place] [address]  Internal MEDICINE  Office Visit Note  Patient Name: Jamie Bennett  [id number]  [id number]  Date of Service: 05/29/2018   Pt is here for routine health maintenance examination  Chief Complaint  Patient presents with  . Annual Exam    6 month cpe. pt recieved medication from the ER and have some qeuestions about continuing the medication  . Gynecologic Exam     The patient has had multiple surgeries to remove a chronic umbilical sinus and hernia repair. She did have some surgical wound dehiscence. Had to have pocket of infection drained per surgery. She is doing much better now. Surgical scar is healing well. Still has some left mid-abdominal pain, but is otherwise much better. She continues to see psychiatry for management of ental health issues. She sees rheumatology and hematology as well.     Current Medication: Outpatient Encounter Medications as of 05/27/2018  Medication Sig  . aspirin EC 81 MG tablet Take 81 mg by mouth daily.  . Cholecalciferol (HM VITAMIN D3) 4000 units CAPS Take 4,000 Units by mouth daily.   . clonazePAM (KLONOPIN) 0.5 MG tablet For acute anxiety, emergencies only  . Coenzyme Q-10 200 MG CAPS Take 200 mg by mouth 2 (two) times daily.   . cyanocobalamin (,VITAMIN B-12,) 1000 MCG/ML injection Inject 1,000 mcg into the muscle every 30 (thirty) days.  Marland Kitchen desipramine (NORPRAMIN) 25 MG tablet 1  qam  . esomeprazole (NEXIUM) 20 MG capsule Take 20 mg by mouth 2 (two) times daily before a meal.  . Fiber, Guar Gum, CHEW Chew 5 mg by mouth daily.   . hydroxychloroquine (PLAQUENIL) 200 MG tablet Take 1 tablet by mouth twice a day Monday through Friday  . hydrOXYzine (ATARAX/VISTARIL) 50 MG tablet Take 1 tablet (50 mg total) by mouth 3 (three) times daily as needed for anxiety.  Marland Kitchen ibuprofen (ADVIL,MOTRIN) 100 MG tablet Take 100 mg by mouth as needed for fever.  Javier Docker Oil 500 MG CAPS Take 500  mg by mouth daily.  Marland Kitchen lamoTRIgine (LAMICTAL) 150 MG tablet Take 1 tablet (150 mg total) by mouth 2 (two) times daily.  Marland Kitchen levothyroxine (SYNTHROID, LEVOTHROID) 112 MCG tablet Take 1 tablet (112 mcg total) by mouth daily.  Marland Kitchen loratadine (CLARITIN) 10 MG tablet Take 10 mg by mouth daily as needed for allergies.  . polyethylene glycol (MIRALAX) packet Take 17 g by mouth daily.  . sucralfate (CARAFATE) 1 g tablet Take 1 tablet (1 g total) by mouth 2 (two) times daily.  Marland Kitchen tiZANidine (ZANAFLEX) 4 MG tablet Take 1 tablet (4 mg total) by mouth 3 (three) times daily as needed.  . traMADol (ULTRAM) 50 MG tablet Take 1 tablet (50 mg total) by mouth every 8 (eight) hours as needed.  . traZODone (DESYREL) 100 MG tablet Take 1 tablet (100 mg total) by mouth at bedtime as needed and may repeat dose one time if needed for sleep.  Marland Kitchen venlafaxine (EFFEXOR) 100 MG tablet Take 1 tablet (100 mg total) by mouth 3 (three) times daily with meals.  . zoledronic acid (RECLAST) 5 MG/100ML SOLN injection Inject 5 mg into the vein See admin instructions. Patient takes yearly  . [DISCONTINUED] busPIRone (BUSPAR) 15 MG tablet Take 1 tablet (15 mg total) by mouth 4 (four) times daily. (Patient taking differently: Take 30 mg by mouth 2 (two) times daily. )  . [DISCONTINUED] sucralfate (CARAFATE) 1 g tablet Take 1 tablet (1 g total)  by mouth 2 (two) times daily.  . [DISCONTINUED] traMADol (ULTRAM) 50 MG tablet Take 1 tablet (50 mg total) by mouth every 6 (six) hours as needed.  . [DISCONTINUED] traMADol (ULTRAM) 50 MG tablet Take 1 tablet (50 mg total) by mouth every 12 (twelve) hours as needed.  . ciclopirox (PENLAC) 8 % solution Apply to affected nail and surrounding nail bed QHS   No facility-administered encounter medications on file as of 05/27/2018.     Surgical History: Past Surgical History:  Procedure Laterality Date  . CHOLECYSTECTOMY  2004  . COLONOSCOPY N/A 07/16/2017   Procedure: COLONOSCOPY;  Surgeon: Manya Silvas, MD;  Location: [facility] ENDOSCOPY;  Service: Endoscopy;  Laterality: N/A;  . EXPLORATORY LAPAROTOMY  2009  . GASTRIC BYPASS  2003   [place] N/A 03/01/2018   Procedure: EXPLORATORY LAPAROTOMY/ UMBILECTOMY;  Surgeon: Robert Bellow, MD;  Location: [facility] ORS;  Service: General;  Laterality: N/A;  . TONSILLECTOMY    . VENTRAL HERNIA REPAIR N/A 03/07/2018   Procedure: HERNIA REPAIR VENTRAL ADULT;  Surgeon: Robert Bellow, MD;  Location: [facility] ORS;  Service: General;  Laterality: N/A;    Medical History: Past Medical History:  Diagnosis Date  . Anemia   . Anxiety   . Bipolar 1 disorder ([place])   . Collagen vascular disease ([place])    rhematoid arthritis  . Constipation   . Fibromyalgia   . GERD (gastroesophageal reflux disease)   . Heart murmur    mild-asymptomatic  . Hepatic steatosis   . History of Roux-en-Y gastric bypass   . Hypotension   . Hypothyroidism   . Opioid abuse ([place])   . Osteoarthritis   . Osteoporosis   . PONV (postoperative nausea and vomiting)    nausea only  . Rheumatic fever   . Rheumatoid arthritis ([place])   . Thyroid disease     Family History: Family History  Problem Relation Age of Onset  . Depression Mother   . Dementia Mother   . Heart disease Mother   . Parkinson's disease Father   . Colon cancer Neg Hx       Review of Systems  Constitutional: Negative for activity change, chills, fatigue and unexpected weight change.  HENT: Negative for congestion, postnasal drip, rhinorrhea, sneezing and sore throat.   Eyes: Negative.  Negative for redness.  Respiratory: Negative for cough, chest tightness, shortness of breath and wheezing.   Cardiovascular: Negative for chest pain and palpitations.  Gastrointestinal: Positive for abdominal pain. Negative for constipation, diarrhea, nausea and vomiting.       Some persistent id-abdominal pain, more on right side than left. Surgical wound is doing much better.   Endocrine:  Negative for cold intolerance, heat intolerance, polydipsia, polyphagia and polyuria.       Well controlled thyroid issues.   Genitourinary: Negative.  Negative for dysuria and frequency.  Musculoskeletal: Positive for arthralgias, back pain and myalgias. Negative for joint swelling and neck pain.  Skin: Positive for rash.       Surgical wound is doing much better.   Allergic/Immunologic: Negative for environmental allergies.  Neurological: Positive for dizziness and headaches. Negative for tremors and numbness.  Hematological: Negative for adenopathy. Does not bruise/bleed easily.  Psychiatric/Behavioral: Positive for dysphoric mood. Negative for behavioral problems (Depression), sleep disturbance and suicidal ideas. The patient is nervous/anxious.        Patient is regularly sees psychiatry.      Vital Signs: BP 128/88 (BP Location: Right Arm,  Patient Position: Sitting, Cuff Size: Large)   Pulse 82   Resp 16   Ht '5\' 5"'  (1.651 m)   Wt (!) 318 lb 6.4 oz (144.4 kg)   SpO2 95%   BMI 52.98 kg/m    Physical Exam  Constitutional: She is oriented to person, place, and time. She appears well-developed and well-nourished. No distress.  HENT:  Head: Normocephalic and atraumatic.  Nose: Nose normal.  Mouth/Throat: Oropharynx is clear and moist. No oropharyngeal exudate.  Eyes: Pupils are equal, round, and reactive to light. EOM are normal.  Neck: Normal range of motion. Neck supple. No JVD present. No tracheal deviation present. No thyromegaly present.  Cardiovascular: Normal rate, regular rhythm, normal heart sounds and intact distal pulses. Exam reveals no gallop and no friction rub.  No murmur heard. Pulmonary/Chest: Effort normal and breath sounds normal. No respiratory distress. She has no wheezes. She has no rales. She exhibits no tenderness. Right breast exhibits no inverted nipple, no mass, no nipple discharge, no skin change and no tenderness. Left breast exhibits no inverted  nipple, no mass, no nipple discharge, no skin change and no tenderness.  Abdominal: Soft. Bowel sounds are normal. There is tenderness in the periumbilical area. There is no rigidity, no rebound and no guarding.    Genitourinary: Vagina normal and uterus normal.  Genitourinary Comments: No tenderness, masses, or organomeglay present during bimanual exam .  Musculoskeletal: Normal range of motion.  Lymphadenopathy:    She has no cervical adenopathy.  Neurological: She is alert and oriented to person, place, and time. No cranial nerve deficit.  Skin: Skin is warm and dry. Capillary refill takes less than 2 seconds. She is not diaphoretic.  Psychiatric: She has a normal mood and affect. Her behavior is normal. Judgment and thought content normal.  Nursing note and vitals reviewed.  Depression screen [facility] 2/9 05/27/2018 02/15/2018 01/03/2018 11/13/2017 08/07/2017  Decreased Interest 0 0 '2 3 1  ' Down, Depressed, Hopeless 0 0 '2 3 1  ' PHQ - 2 Score 0 0 '4 6 2  ' Altered sleeping - 0 0 0 0  Tired, decreased energy - 0 '3 3 3  ' Change in appetite - 0 '3 3 2  ' Feeling bad or failure about yourself  - 0 '3 3 2  ' Trouble concentrating - 0 0 2 0  Moving slowly or fidgety/restless - 0 0 0 0  Suicidal thoughts - 0 1 1 0  PHQ-9 Score - 0 '14 18 9  ' Difficult doing work/chores - Not difficult at all Somewhat difficult - Somewhat difficult  Some encounter information is confidential and restricted. Go to Review Flowsheets activity to see all data.  Some recent data might be hidden    Functional Status Survey: Is the patient deaf or have difficulty hearing?: No Does the patient have difficulty seeing, even when wearing glasses/contacts?: No Does the patient have difficulty concentrating, remembering, or making decisions?: No Does the patient have difficulty walking or climbing stairs?: Yes(climbing stairs) Does the patient have difficulty dressing or bathing?: No Does the patient have difficulty doing errands alone  such as visiting a doctor's office or shopping?: No  No flowsheet data found.  Fall Risk  05/27/2018 02/15/2018 01/03/2018 11/13/2017 08/07/2017  Falls in the past year? No No No No No      LABS: Recent Results (from the past 2160 hour(s))  Surgical pathology     Status: None   Collection Time: 03/01/18  9:36 AM  Result Value Ref Range  SURGICAL PATHOLOGY      Surgical Pathology CASE: [id number] PATIENT: Ellery Plunk Surgical Pathology Report     SPECIMEN SUBMITTED: A. Abscess suture on cephelad margin  CLINICAL HISTORY: None provided  PRE-OPERATIVE DIAGNOSIS: Umbilical sinus  POST-OPERATIVE DIAGNOSIS: Same as pre op     DIAGNOSIS: A. UMBILICAL SINUS WITH ABSCESS; EXCISION: - FIBROADIPOSE TISSUE WITH SUTURE MATERIAL, ASSOCIATED FOREIGN BODY GIANT CELL REACTION, AND FISTULA FORMATION. - OVERLYING SKIN WITH CHRONIC INFLAMMATION AND FIBROSIS. - NEGATIVE FOR MALIGNANCY.  GROSS DESCRIPTION: A. Labeled: Abscess (suture on cephalad margin) Received: In formalin Tissue fragment(s): 1 Size: 10.7 by up to 10 x 1.1 cm Description: Received is an ellipse of centrally depressed skin excised to a depth up to 10 cm.  There is a suture at one tip marked as cephalad by the surgeon (designated 6:00 for grossing).  The specimen is inked as follows: 3:00 half blue, 9:00 half black, 12:00 tip red, 6:00 tip (cephalad )-green and most deep edge marked orange.  Sectioning the specimen reveals a tract that is hemorrhagic with some surrounding fibrous stranding.  The tract/hemorrhage in the superior deep aspect extends to the margin. There is some pale blue suture material embedded in the fibrous tissue. Block summary 1 - fibrous tissue with blue suture material 2-3 - representative tract 4 - representative tract with skin    Final Diagnosis performed by Quay Burow, MD.   Electronically signed 03/04/2018 1:03:15PM The electronic signature indicates that the named  Attending Pathologist has evaluated the specimen  Technical component performed at [facility], [address], [place], [place] [address] Lab: [phone] Dir: Rush Farmer, MD, MMM  Professional component performed at [facility], [place], [place], [place] [address] Lab: [phone] Dir: Dellia Nims. Rubinas, MD   CBC     Status: Abnormal   Collection Time: 03/02/18  5:58 AM  Result Value Ref Range   WBC 6.4 3.6 - 11.0 K/uL   RBC 3.69 (L) 3.80 - 5.20 MIL/uL   Hemoglobin 11.3 (L) 12.0 - 16.0 g/dL   HCT 33.6 (L) 35.0 - 47.0 %   MCV 91.0 80.0 - 100.0 fL   MCH 30.6 26.0 - 34.0 pg   MCHC 33.6 32.0 - 36.0 g/dL   RDW 13.2 11.5 - 14.5 %   Platelets 188 150 - 440 K/uL    Comment: Performed at [facility], [address] [place], [place] [address]  Basic metabolic panel     Status: Abnormal   Collection Time: 03/02/18  5:58 AM  Result Value Ref Range   Sodium 143 135 - 145 mmol/L   Potassium 4.1 3.5 - 5.1 mmol/L   Chloride 110 98 - 111 mmol/L   CO2 26 22 - 32 mmol/L   Glucose, Bld 94 70 - 99 mg/dL   BUN 13 6 - 20 mg/dL   Creatinine, Ser 0.96 0.44 - 1.00 mg/dL   Calcium 8.7 (L) 8.9 - 10.3 mg/dL   GFR calc non Af Amer >60 >60 mL/min   GFR calc Af Amer >60 >60 mL/min    Comment: (NOTE) The eGFR has been calculated using the CKD EPI equation. This calculation has not been validated in all clinical situations. eGFR's persistently <60 mL/min signify possible Chronic Kidney Disease.    Anion gap 7 5 - 15    Comment: Performed at [facility], [address] [place], [place] [address]  CBC with Differential/Platelet     Status: Abnormal   Collection Time: 03/07/18  2:10  AM  Result Value Ref Range   WBC 7.5 3.6 - 11.0 K/uL   RBC 4.48 3.80 - 5.20 MIL/uL   Hemoglobin 13.7 12.0 - 16.0 g/dL   HCT 40.2 35.0 - 47.0 %   MCV 89.7 80.0 - 100.0 fL   MCH 30.5 26.0 - 34.0 pg   MCHC 34.0 32.0 - 36.0 g/dL   RDW 13.0 11.5 - 14.5 %   Platelets  282 150 - 440 K/uL   Neutrophils Relative % 84 %   Neutro Abs 6.3 1.4 - 6.5 K/uL   Lymphocytes Relative 10 %   Lymphs Abs 0.8 (L) 1.0 - 3.6 K/uL   Monocytes Relative 5 %   Monocytes Absolute 0.3 0.2 - 0.9 K/uL   Eosinophils Relative 1 %   Eosinophils Absolute 0.1 0 - 0.7 K/uL   Basophils Relative 0 %   Basophils Absolute 0.0 0 - 0.1 K/uL    Comment: Performed at [facility], [address] [place], [place] [address]  Comprehensive metabolic panel     Status: Abnormal   Collection Time: 03/07/18  2:10 AM  Result Value Ref Range   Sodium 141 135 - 145 mmol/L   Potassium 4.1 3.5 - 5.1 mmol/L   Chloride 104 98 - 111 mmol/L   CO2 24 22 - 32 mmol/L   Glucose, Bld 141 (H) 70 - 99 mg/dL   BUN 12 6 - 20 mg/dL   Creatinine, Ser 0.83 0.44 - 1.00 mg/dL   Calcium 9.9 8.9 - 10.3 mg/dL   Total Protein 7.9 6.5 - 8.1 g/dL   Albumin 3.9 3.5 - 5.0 g/dL   AST 31 15 - 41 U/L   ALT 65 (H) 0 - 44 U/L   Alkaline Phosphatase 218 (H) 38 - 126 U/L   Total Bilirubin 0.7 0.3 - 1.2 mg/dL   GFR calc non Af Amer >60 >60 mL/min   GFR calc Af Amer >60 >60 mL/min    Comment: (NOTE) The eGFR has been calculated using the CKD EPI equation. This calculation has not been validated in all clinical situations. eGFR's persistently <60 mL/min signify possible Chronic Kidney Disease.    Anion gap 13 5 - 15    Comment: Performed at [facility], [place], [place] [address]  Lactic acid, plasma     Status: Abnormal   Collection Time: 03/07/18  2:10 AM  Result Value Ref Range   Lactic Acid, Venous 2.0 (HH) 0.5 - 1.9 mmol/L    Comment: CRITICAL RESULT CALLED TO, READ BACK BY AND VERIFIED WITH HENRY RIVERA ON 03/07/18 AT 0320 JAG Performed at [facility], [address] [place], [place] [address]   Lipase, blood     Status: None   Collection Time: 03/07/18  2:10 AM  Result Value Ref Range   Lipase 24 11 - 51 U/L    Comment: Performed at [facility], [address] [place], [place] [address]  Lactic acid, plasma     Status: None   Collection Time: 03/07/18  5:17 AM  Result Value Ref Range   Lactic Acid, Venous 1.7 0.5 - 1.9 mmol/L    Comment: Performed at [facility], [address] [place], [place] [address]  Aerobic/Anaerobic Culture (surgical/deep wound)     Status: None   Collection Time: 03/07/18  7:44 AM  Result Value Ref Range   Specimen Description WOUND ABDOMEN    Special Requests PT ON ANCEF    Gram Stain  FEW WBC PRESENT, PREDOMINANTLY PMN NO ORGANISMS SEEN    Culture      No growth aerobically or anaerobically. Performed at [facility], [place] [address], [place], [place] [address]    Report Status 03/12/2018 FINAL   HIV antibody (Routine Testing)     Status: None   Collection Time: 03/08/18  7:00 AM  Result Value Ref Range   HIV Screen 4th Generation wRfx Non Reactive Non Reactive    Comment: (NOTE) Performed At: [facility] [place], [place] [id number] Rush Farmer MD EY:[id number]   CBC     Status: Abnormal   Collection Time: 03/08/18  7:00 AM  Result Value Ref Range   WBC 7.4 3.6 - 11.0 K/uL   RBC 3.75 (L) 3.80 - 5.20 MIL/uL   Hemoglobin 11.4 (L) 12.0 - 16.0 g/dL   HCT 34.0 (L) 35.0 - 47.0 %   MCV 90.6 80.0 - 100.0 fL   MCH 30.4 26.0 - 34.0 pg   MCHC 33.5 32.0 - 36.0 g/dL   RDW 13.4 11.5 - 14.5 %   Platelets 220 150 - 440 K/uL    Comment: Performed at [facility], [address] [place], [place] [address]  Basic metabolic panel     Status: Abnormal   Collection Time: 03/08/18  7:00 AM  Result Value Ref Range   Sodium 143 135 - 145 mmol/L   Potassium 3.4 (L) 3.5 - 5.1 mmol/L   Chloride 108 98 - 111 mmol/L   CO2 27 22 - 32 mmol/L   Glucose, Bld 103 (H) 70 - 99 mg/dL   BUN 9 6 - 20 mg/dL   Creatinine, Ser 0.89 0.44 - 1.00 mg/dL   Calcium 8.4 (L) 8.9 - 10.3 mg/dL   GFR calc non Af Amer >60 >60 mL/min   GFR calc Af Amer >60 >60 mL/min     Comment: (NOTE) The eGFR has been calculated using the CKD EPI equation. This calculation has not been validated in all clinical situations. eGFR's persistently <60 mL/min signify possible Chronic Kidney Disease.    Anion gap 8 5 - 15    Comment: Performed at [facility], [address] [place], [place] [address]  Basic metabolic panel     Status: Abnormal   Collection Time: 03/09/18  4:16 AM  Result Value Ref Range   Sodium 139 135 - 145 mmol/L   Potassium 3.4 (L) 3.5 - 5.1 mmol/L   Chloride 104 98 - 111 mmol/L   CO2 27 22 - 32 mmol/L   Glucose, Bld 100 (H) 70 - 99 mg/dL   BUN 10 6 - 20 mg/dL   Creatinine, Ser 0.86 0.44 - 1.00 mg/dL   Calcium 8.2 (L) 8.9 - 10.3 mg/dL   GFR calc non Af Amer >60 >60 mL/min   GFR calc Af Amer >60 >60 mL/min    Comment: (NOTE) The eGFR has been calculated using the CKD EPI equation. This calculation has not been validated in all clinical situations. eGFR's persistently <60 mL/min signify possible Chronic Kidney Disease.    Anion gap 8 5 - 15    Comment: Performed at [facility], [address] [place],  [address]  Basic metabolic panel     Status: Abnormal   Collection Time: 03/10/18  3:11 AM  Result Value Ref Range   Sodium 140 135 - 145 mmol/L   Potassium 3.8 3.5 - 5.1 mmol/L   Chloride 105 98 - 111 mmol/L  CO2 27 22 - 32 mmol/L   Glucose, Bld 100 (H) 70 - 99 mg/dL   BUN 7 6 - 20 mg/dL   Creatinine, Ser 0.81 0.44 - 1.00 mg/dL   Calcium 8.3 (L) 8.9 - 10.3 mg/dL   GFR calc non Af Amer >60 >60 mL/min   GFR calc Af Amer >60 >60 mL/min    Comment: (NOTE) The eGFR has been calculated using the CKD EPI equation. This calculation has not been validated in all clinical situations. eGFR's persistently <60 mL/min signify possible Chronic Kidney Disease.    Anion gap 8 5 - 15    Comment: Performed at [facility], [address] [place], [place] [address]  CBC with Differential/Platelet     Status:  Abnormal   Collection Time: 03/11/18  5:53 AM  Result Value Ref Range   WBC 4.4 3.6 - 11.0 K/uL   RBC 3.80 3.80 - 5.20 MIL/uL   Hemoglobin 11.8 (L) 12.0 - 16.0 g/dL   HCT 34.2 (L) 35.0 - 47.0 %   MCV 90.1 80.0 - 100.0 fL   MCH 30.9 26.0 - 34.0 pg   MCHC 34.3 32.0 - 36.0 g/dL   RDW 13.0 11.5 - 14.5 %   Platelets 293 150 - 440 K/uL   Neutrophils Relative % 58 %   Neutro Abs 2.6 1.4 - 6.5 K/uL   Lymphocytes Relative 24 %   Lymphs Abs 1.0 1.0 - 3.6 K/uL   Monocytes Relative 10 %   Monocytes Absolute 0.4 0.2 - 0.9 K/uL   Eosinophils Relative 8 %   Eosinophils Absolute 0.4 0 - 0.7 K/uL   Basophils Relative 0 %   Basophils Absolute 0.0 0 - 0.1 K/uL    Comment: Performed at [facility], [address] [place], [place] [address]  CBC with Differential/Platelet     Status: Abnormal   Collection Time: 03/12/18  3:08 AM  Result Value Ref Range   WBC 5.8 3.6 - 11.0 K/uL   RBC 3.82 3.80 - 5.20 MIL/uL   Hemoglobin 11.3 (L) 12.0 - 16.0 g/dL   HCT 34.7 (L) 35.0 - 47.0 %   MCV 90.7 80.0 - 100.0 fL   MCH 29.5 26.0 - 34.0 pg   MCHC 32.5 32.0 - 36.0 g/dL   RDW 13.2 11.5 - 14.5 %   Platelets 316 150 - 440 K/uL   Neutrophils Relative % 46 %   Neutro Abs 2.7 1.4 - 6.5 K/uL   Lymphocytes Relative 35 %   Lymphs Abs 2.0 1.0 - 3.6 K/uL   Monocytes Relative 10 %   Monocytes Absolute 0.6 0.2 - 0.9 K/uL   Eosinophils Relative 9 %   Eosinophils Absolute 0.5 0 - 0.7 K/uL   Basophils Relative 0 %   Basophils Absolute 0.0 0 - 0.1 K/uL    Comment: Performed at [facility], [address] [place], [place] [address]  Basic metabolic panel     Status: Abnormal   Collection Time: 03/12/18  3:08 AM  Result Value Ref Range   Sodium 145 135 - 145 mmol/L   Potassium 3.8 3.5 - 5.1 mmol/L   Chloride 110 98 - 111 mmol/L   CO2 28 22 - 32 mmol/L   Glucose, Bld 102 (H) 70 - 99 mg/dL   BUN 6 6 - 20 mg/dL   Creatinine, Ser 0.90 0.44 - 1.00 mg/dL   Calcium 8.4 (L) 8.9 - 10.3 mg/dL   GFR  calc non Af Amer >60 >60  mL/min   GFR calc Af Amer >60 >60 mL/min    Comment: (NOTE) The eGFR has been calculated using the CKD EPI equation. This calculation has not been validated in all clinical situations. eGFR's persistently <60 mL/min signify possible Chronic Kidney Disease.    Anion gap 7 5 - 15    Comment: Performed at [facility], [address] [place], [place] [address]  Anaerobic and Aerobic Culture     Status: None   Collection Time: 03/18/18  1:53 PM  Result Value Ref Range   Anaerobic Culture Final report    Result 1 Comment     Comment: No anaerobic growth in 72 hours.   Aerobic Culture Final report    Result 1 Mixed skin flora     Comment: Light growth  Iron and TIBC     Status: None   Collection Time: 04/18/18 10:10 AM  Result Value Ref Range   Iron 59 28 - 170 ug/dL   TIBC 414 250 - 450 ug/dL   Saturation Ratios 14 10.4 - 31.8 %   UIBC 355 ug/dL    Comment: Performed at [facility], [address] [place], [place] [address]  Ferritin     Status: None   Collection Time: 04/18/18 10:10 AM  Result Value Ref Range   Ferritin 18 11 - 307 ng/mL    Comment: Performed at [facility], [address] [place], [place] [address]  CBC with Differential     Status: Abnormal   Collection Time: 04/18/18 10:10 AM  Result Value Ref Range   WBC 4.6 3.6 - 11.0 K/uL   RBC 3.65 (L) 3.80 - 5.20 MIL/uL   Hemoglobin 11.0 (L) 12.0 - 16.0 g/dL   HCT 32.6 (L) 35.0 - 47.0 %   MCV 89.3 80.0 - 100.0 fL   MCH 30.1 26.0 - 34.0 pg   MCHC 33.7 32.0 - 36.0 g/dL   RDW 14.4 11.5 - 14.5 %   Platelets 225 150 - 440 K/uL   Neutrophils Relative % 43 %   Neutro Abs 1.9 1.4 - 6.5 K/uL   Lymphocytes Relative 39 %   Lymphs Abs 1.8 1.0 - 3.6 K/uL   Monocytes Relative 9 %   Monocytes Absolute 0.4 0.2 - 0.9 K/uL   Eosinophils Relative 9 %   Eosinophils Absolute 0.4 0 - 0.7 K/uL   Basophils Relative 0 %   Basophils Absolute 0.0 0 - 0.1 K/uL    Comment:  Performed at [facility], [address] [place], [place] [address]  I-STAT creatinine     Status: None   Collection Time: 04/24/18 11:44 AM  Result Value Ref Range   Creatinine, Ser 0.90 0.44 - 1.00 mg/dL  Aerobic/Anaerobic Culture (surgical/deep wound)     Status: None   Collection Time: 05/10/18  2:35 PM  Result Value Ref Range   Specimen Description      ABDOMEN Performed at [facility], [address], [place], [place] [address]    Special Requests      NONE Performed at [facility], [address] [place], [place] [address]    Culture      No growth aerobically or anaerobically. Performed at [facility], [place] [address] [place], [place] [address]    Report Status 05/15/2018 FINAL   Acid Fast Smear (AFB)     Status: None   Collection Time: 05/10/18  2:35 PM  Result Value Ref Range   AFB  Specimen Processing Concentration    Acid Fast Smear Negative     Comment: (NOTE) Performed At: [facility] [place], [place] [id number] Rush Farmer MD DH:[id number]    Source (AFB) ABDOMEN     Comment: Performed at [facility], [address] [place],  [address]  UA/M w/rflx Culture, Routine     Status: None   Collection Time: 05/27/18  2:22 PM  Result Value Ref Range   Specific Gravity, UA 1.017 1.005 - 1.030   pH, UA 6.0 5.0 - 7.5   Color, UA Yellow Yellow   Appearance Ur Clear Clear   Leukocytes, UA Negative Negative   Protein, UA Negative Negative/Trace   Glucose, UA Negative Negative   Ketones, UA Negative Negative   RBC, UA Negative Negative   Bilirubin, UA Negative Negative   Urobilinogen, Ur 0.2 0.2 - 1.0 mg/dL   Nitrite, UA Negative Negative   Microscopic Examination Comment     Comment: Microscopic follows if indicated.   Microscopic Examination See below:     Comment: Microscopic was indicated and was performed.   Urinalysis Reflex Comment     Comment: This specimen will not reflex  to a Urine Culture.  Microscopic Examination     Status: None   Collection Time: 05/27/18  2:22 PM  Result Value Ref Range   WBC, UA 0-5 0 - 5 /hpf   RBC, UA 0-2 0 - 2 /hpf   Epithelial Cells (non renal) 0-10 0 - 10 /hpf   Casts None seen None seen /lpf   Mucus, UA Present Not Estab.   Bacteria, UA Few None seen/Few   Assessment/Plan: 1. Encounter for general adult medical examination with abnormal findings Annual health maintenance exam today.  2. Surgical wound dehiscence, initial encounter Much improved. Continue regular visits with surgery/wound care until release.  3. Acquired hypothyroidism Check thyroid panel and adjust levothyroxine as indicated.   4. Duodenal ulcer disease - sucralfate (CARAFATE) 1 g tablet; Take 1 tablet (1 g total) by mouth 2 (two) times daily.  Dispense: 180 tablet; Refill: 3  5. Screening for malignant neoplasm of cervix - Pap IG and HPV (high risk) DNA detection  6. Screening for breast cancer - MM DIGITAL SCREENING BILATERAL; Future  7. Dysuria - UA/M w/rflx Culture, Routine   General Counseling: Addisen verbalizes understanding of the findings of todays visit and agrees with plan of treatment. I have discussed any further diagnostic evaluation that may be needed or ordered today. We also reviewed her medications today. she has been encouraged to call the office with any questions or concerns that should arise related to todays visit.    Counseling:  This patient was seen by Leretha Pol FNP Collaboration with Dr Lavera Guise as a part of collaborative care agreement  Orders Placed This Encounter  Procedures  . Microscopic Examination  . MM DIGITAL SCREENING BILATERAL  . UA/M w/rflx Culture, Routine    Meds ordered this encounter  Medications  . sucralfate (CARAFATE) 1 g tablet    Sig: Take 1 tablet (1 g total) by mouth 2 (two) times daily.    Dispense:  180 tablet    Refill:  3    Order Specific Question:   Supervising Provider      Answer:   Lavera Guise [[id number]]    Time spent: [place], MD  Internal Medicine

## 2018-05-28 ENCOUNTER — Other Ambulatory Visit: Payer: Self-pay

## 2018-05-28 ENCOUNTER — Other Ambulatory Visit (HOSPITAL_COMMUNITY): Payer: Self-pay

## 2018-05-28 ENCOUNTER — Ambulatory Visit (INDEPENDENT_AMBULATORY_CARE_PROVIDER_SITE_OTHER): Payer: Medicare HMO | Admitting: General Surgery

## 2018-05-28 ENCOUNTER — Encounter: Payer: Self-pay | Admitting: General Surgery

## 2018-05-28 VITALS — BP 119/74 | HR 78 | Temp 98.1°F | Resp 18 | Ht 65.0 in | Wt 319.2 lb

## 2018-05-28 DIAGNOSIS — F411 Generalized anxiety disorder: Secondary | ICD-10-CM

## 2018-05-28 DIAGNOSIS — K436 Other and unspecified ventral hernia with obstruction, without gangrene: Secondary | ICD-10-CM

## 2018-05-28 LAB — MICROSCOPIC EXAMINATION: Casts: NONE SEEN /lpf

## 2018-05-28 LAB — UA/M W/RFLX CULTURE, ROUTINE
Bilirubin, UA: NEGATIVE
Glucose, UA: NEGATIVE
Ketones, UA: NEGATIVE
Leukocytes, UA: NEGATIVE
Nitrite, UA: NEGATIVE
Protein, UA: NEGATIVE
RBC, UA: NEGATIVE
Specific Gravity, UA: 1.017 (ref 1.005–1.030)
Urobilinogen, Ur: 0.2 mg/dL (ref 0.2–1.0)
pH, UA: 6 (ref 5.0–7.5)

## 2018-05-28 MED ORDER — BUSPIRONE HCL 15 MG PO TABS: 15.0000 mg | ORAL_TABLET | Freq: Four times a day (QID) | ORAL | 0 refills | Status: DC

## 2018-05-28 NOTE — Progress Notes (Signed)
Patient ID: Jamie Bennett, female   DOB: 1961/02/11, 57 y.o.   MRN: [id number]  Chief Complaint  Patient presents with  . Routine Post Op    Post op Exploritory laparotomy and hernia repair, Sx 03/07/18 *pt can't come on 05-27-18    HPI Jamie Bennett is a 57 y.o. female here today to follow up for Post op Exploritory laparotomy and hernia repair, Sx 03/07/18. Patient states she is feeling better. She is present in the office with her husband Gwyndolyn Saxon. Husband states she has been having pain where the drain was also states she has been having some popping pains. Her family situation has improved with placement of her father in a assisted living facility in [place].  Her mother is living with her brother in [place].  HPI  Past Medical History:  Diagnosis Date  . Anemia   . Anxiety   . Bipolar 1 disorder ([place])   . Collagen vascular disease ([place])    rhematoid arthritis  . Constipation   . Fibromyalgia   . GERD (gastroesophageal reflux disease)   . Heart murmur    mild-asymptomatic  . Hepatic steatosis   . History of Roux-en-Y gastric bypass   . Hypotension   . Hypothyroidism   . Opioid abuse ([place])   . Osteoarthritis   . Osteoporosis   . PONV (postoperative nausea and vomiting)    nausea only  . Rheumatic fever   . Rheumatoid arthritis ([place])   . Thyroid disease     Past Surgical History:  Procedure Laterality Date  . CHOLECYSTECTOMY  2004  . COLONOSCOPY N/A 07/16/2017   Procedure: COLONOSCOPY;  Surgeon: Manya Silvas, MD;  Location: [facility] ENDOSCOPY;  Service: Endoscopy;  Laterality: N/A;  . EXPLORATORY LAPAROTOMY  2009  . GASTRIC BYPASS  2003   [place] N/A 03/01/2018   Procedure: EXPLORATORY LAPAROTOMY/ UMBILECTOMY;  Surgeon: Robert Bellow, MD;  Location: [facility] ORS;  Service: General;  Laterality: N/A;  . TONSILLECTOMY    . VENTRAL HERNIA REPAIR N/A 03/07/2018   Procedure: HERNIA REPAIR VENTRAL ADULT;  Surgeon: Robert Bellow, MD;   Location: [facility] ORS;  Service: General;  Laterality: N/A;    Family History  Problem Relation Age of Onset  . Depression Mother   . Dementia Mother   . Heart disease Mother   . Parkinson's disease Father   . Colon cancer Neg Hx     Social History Social History   Tobacco Use  . Smoking status: Never Smoker  . Smokeless tobacco: Never Used  Substance Use Topics  . Alcohol use: No    Alcohol/week: 0.0 standard drinks  . Drug use: No    Allergies  Allergen Reactions  . Lactose Intolerance (Gi) Other (See Comments)    Bloating and GI distress  . Sulfa Antibiotics Hives    Current Outpatient Medications  Medication Sig Dispense Refill  . aspirin EC 81 MG tablet Take 81 mg by mouth daily.    . busPIRone (BUSPAR) 15 MG tablet Take 1 tablet (15 mg total) by mouth 4 (four) times daily. 360 tablet 0  . Cholecalciferol (HM VITAMIN D3) 4000 units CAPS Take 4,000 Units by mouth daily.     . ciclopirox (PENLAC) 8 % solution Apply to affected nail and surrounding nail bed QHS 6.6 mL 2  . clonazePAM (KLONOPIN) 0.5 MG tablet For acute anxiety, emergencies only 30 tablet 0  . Coenzyme Q-10 200 MG CAPS Take 200 mg by mouth 2 (two)  times daily.     . cyanocobalamin (,VITAMIN B-12,) 1000 MCG/ML injection Inject 1,000 mcg into the muscle every 30 (thirty) days.    Marland Kitchen desipramine (NORPRAMIN) 25 MG tablet 1  qam 30 tablet 2  . esomeprazole (NEXIUM) 20 MG capsule Take 20 mg by mouth 2 (two) times daily before a meal.    . Fiber, Guar Gum, CHEW Chew 5 mg by mouth daily.     . hydroxychloroquine (PLAQUENIL) 200 MG tablet Take 1 tablet by mouth twice a day Monday through Friday 120 tablet 0  . hydrOXYzine (ATARAX/VISTARIL) 50 MG tablet Take 1 tablet (50 mg total) by mouth 3 (three) times daily as needed for anxiety. 270 tablet 1  . ibuprofen (ADVIL,MOTRIN) 100 MG tablet Take 100 mg by mouth as needed for fever.    Javier Docker Oil 500 MG CAPS Take 500 mg by mouth daily.    Marland Kitchen lamoTRIgine (LAMICTAL) 150 MG  tablet Take 1 tablet (150 mg total) by mouth 2 (two) times daily. 180 tablet 1  . levothyroxine (SYNTHROID, LEVOTHROID) 112 MCG tablet Take 1 tablet (112 mcg total) by mouth daily. 90 tablet 1  . loratadine (CLARITIN) 10 MG tablet Take 10 mg by mouth daily as needed for allergies.    . polyethylene glycol (MIRALAX) packet Take 17 g by mouth daily. 14 each 0  . sucralfate (CARAFATE) 1 g tablet Take 1 tablet (1 g total) by mouth 2 (two) times daily. 180 tablet 3  . tiZANidine (ZANAFLEX) 4 MG tablet Take 1 tablet (4 mg total) by mouth 3 (three) times daily as needed. 90 tablet 1  . traMADol (ULTRAM) 50 MG tablet Take 1 tablet (50 mg total) by mouth every 8 (eight) hours as needed. 24 tablet 0  . traZODone (DESYREL) 100 MG tablet Take 1 tablet (100 mg total) by mouth at bedtime as needed and may repeat dose one time if needed for sleep. 60 tablet 2  . venlafaxine (EFFEXOR) 100 MG tablet Take 1 tablet (100 mg total) by mouth 3 (three) times daily with meals. 90 tablet 4  . zoledronic acid (RECLAST) 5 MG/100ML SOLN injection Inject 5 mg into the vein See admin instructions. Patient takes yearly     No current facility-administered medications for this visit.     Review of Systems Review of Systems  Constitutional: Negative.   Respiratory: Negative.   Cardiovascular: Negative.     Blood pressure 119/74, pulse 78, temperature 98.1 F (36.7 C), temperature source Temporal, resp. rate 18, height 5\' 5"  (1.651 m), weight (!) 319 lb 3.2 oz (144.8 kg), SpO2 95 %.  Physical Exam Physical Exam  Constitutional: She is oriented to person, place, and time. She appears well-developed and well-nourished.  Eyes: Conjunctivae are normal. No scleral icterus.  Abdominal:    Lymphadenopathy:    She has no cervical adenopathy.  Neurological: She is alert and oriented to person, place, and time.  Skin: Skin is warm and dry.    Data Reviewed Ultrasound-guided seroma aspiration completed May 10, 2018  revealed 200 cc of fluid, culture negative.  The procedure was well-tolerated.  Photo of the abdominal wound has been placed into the chart under the media section for today's visit.  Assessment    Doing well post ventral hernia repair.    Plan Patient has been informed to return in 6 weeks.She has been advised to call the office for any questions or concerns. HPI, Physical Exam, Assessment and Plan have been scribed under the direction and  in the presence of Hervey Ard, [place].  I have completed the exam and reviewed the above documentation for accuracy and completeness.  I agree with the above.  Haematologist has been used and any errors in dictation or transcription are unintentional.  Hervey Ard, M.D., F.A.C.S.  Randa R. Bobette Mo, CMA    Forest Gleason Byrnett 05/29/2018, 5:50 AM

## 2018-05-28 NOTE — Patient Instructions (Signed)
Patient has been informed to return in 6 weeks.She has been advised to call the office for any questions or concerns.

## 2018-05-29 DIAGNOSIS — K269 Duodenal ulcer, unspecified as acute or chronic, without hemorrhage or perforation: Secondary | ICD-10-CM | POA: Insufficient documentation

## 2018-05-29 DIAGNOSIS — R300 Dysuria: Secondary | ICD-10-CM | POA: Insufficient documentation

## 2018-05-29 DIAGNOSIS — Z124 Encounter for screening for malignant neoplasm of cervix: Secondary | ICD-10-CM | POA: Insufficient documentation

## 2018-05-29 DIAGNOSIS — Z1239 Encounter for other screening for malignant neoplasm of breast: Secondary | ICD-10-CM | POA: Insufficient documentation

## 2018-06-02 LAB — PAP IG AND HPV HIGH-RISK: HPV, high-risk: NEGATIVE

## 2018-06-03 ENCOUNTER — Other Ambulatory Visit (HOSPITAL_COMMUNITY): Payer: Self-pay

## 2018-06-03 DIAGNOSIS — F332 Major depressive disorder, recurrent severe without psychotic features: Secondary | ICD-10-CM

## 2018-06-03 DIAGNOSIS — F603 Borderline personality disorder: Secondary | ICD-10-CM

## 2018-06-03 MED ORDER — LAMOTRIGINE 150 MG PO TABS: 150.0000 mg | ORAL_TABLET | Freq: Two times a day (BID) | ORAL | 0 refills | Status: DC

## 2018-06-04 DIAGNOSIS — R69 Illness, unspecified: Secondary | ICD-10-CM | POA: Diagnosis not present

## 2018-06-04 DIAGNOSIS — F4010 Social phobia, unspecified: Secondary | ICD-10-CM | POA: Diagnosis not present

## 2018-06-04 DIAGNOSIS — F603 Borderline personality disorder: Secondary | ICD-10-CM | POA: Diagnosis not present

## 2018-06-14 ENCOUNTER — Inpatient Hospital Stay: Payer: Medicare HMO | Attending: Hematology and Oncology

## 2018-06-14 DIAGNOSIS — Z79899 Other long term (current) drug therapy: Secondary | ICD-10-CM | POA: Insufficient documentation

## 2018-06-14 DIAGNOSIS — E538 Deficiency of other specified B group vitamins: Secondary | ICD-10-CM | POA: Diagnosis not present

## 2018-06-14 MED ORDER — CYANOCOBALAMIN 1000 MCG/ML IJ SOLN
1000.0000 ug | Freq: Once | INTRAMUSCULAR | Status: AC
  Administered 2018-06-14: 1000 ug via INTRAMUSCULAR

## 2018-06-18 ENCOUNTER — Other Ambulatory Visit: Payer: Self-pay

## 2018-06-18 MED ORDER — TIZANIDINE HCL 4 MG PO TABS: 4.0000 mg | ORAL_TABLET | Freq: Three times a day (TID) | ORAL | 1 refills | Status: DC | PRN

## 2018-06-24 DIAGNOSIS — Z79899 Other long term (current) drug therapy: Secondary | ICD-10-CM | POA: Diagnosis not present

## 2018-06-24 DIAGNOSIS — H524 Presbyopia: Secondary | ICD-10-CM | POA: Diagnosis not present

## 2018-06-25 DIAGNOSIS — Z1231 Encounter for screening mammogram for malignant neoplasm of breast: Secondary | ICD-10-CM | POA: Diagnosis not present

## 2018-06-26 LAB — ACID FAST CULTURE WITH REFLEXED SENSITIVITIES (MYCOBACTERIA): Acid Fast Culture: NEGATIVE

## 2018-06-30 ENCOUNTER — Other Ambulatory Visit (HOSPITAL_COMMUNITY): Payer: Self-pay | Admitting: Psychiatry

## 2018-07-01 ENCOUNTER — Other Ambulatory Visit (HOSPITAL_COMMUNITY): Payer: Self-pay

## 2018-07-01 ENCOUNTER — Other Ambulatory Visit: Payer: Self-pay

## 2018-07-01 ENCOUNTER — Other Ambulatory Visit: Payer: Self-pay | Admitting: Rheumatology

## 2018-07-01 DIAGNOSIS — F411 Generalized anxiety disorder: Secondary | ICD-10-CM

## 2018-07-01 MED ORDER — CLONAZEPAM 0.5 MG PO TABS: ORAL_TABLET | ORAL | 0 refills | Status: DC

## 2018-07-01 NOTE — Telephone Encounter (Signed)
Last visit: 03/19/18 Next visit: 08/20/17 Labs: 03/10/18 Stable PLQ eye exam: 03/2017.  Patient states she updated her PLQ eye exam last week and will contact eye doctor to have records faxed.   Okay to refill 30 day supply per Dr. Estanislado Pandy

## 2018-07-02 DIAGNOSIS — F4010 Social phobia, unspecified: Secondary | ICD-10-CM | POA: Diagnosis not present

## 2018-07-02 DIAGNOSIS — R69 Illness, unspecified: Secondary | ICD-10-CM | POA: Diagnosis not present

## 2018-07-02 DIAGNOSIS — F603 Borderline personality disorder: Secondary | ICD-10-CM | POA: Diagnosis not present

## 2018-07-07 ENCOUNTER — Other Ambulatory Visit (HOSPITAL_COMMUNITY): Payer: Self-pay | Admitting: Psychiatry

## 2018-07-08 ENCOUNTER — Other Ambulatory Visit (HOSPITAL_COMMUNITY): Payer: Self-pay

## 2018-07-08 ENCOUNTER — Telehealth: Payer: Self-pay | Admitting: Rheumatology

## 2018-07-08 MED ORDER — DESIPRAMINE HCL 25 MG PO TABS: ORAL_TABLET | ORAL | 0 refills | Status: DC

## 2018-07-08 NOTE — Telephone Encounter (Signed)
Patient left a voicemail requesting a return call.   °

## 2018-07-08 NOTE — Telephone Encounter (Signed)
Patient advised that her prescription for PLQ was sent in on 07/01/18. Patient advised we did receive her PLQ eye exam.

## 2018-07-11 ENCOUNTER — Other Ambulatory Visit: Payer: Self-pay

## 2018-07-11 ENCOUNTER — Ambulatory Visit (INDEPENDENT_AMBULATORY_CARE_PROVIDER_SITE_OTHER): Payer: Medicare HMO | Admitting: General Surgery

## 2018-07-11 ENCOUNTER — Encounter: Payer: Self-pay | Admitting: General Surgery

## 2018-07-11 VITALS — BP 130/87 | HR 90 | Temp 97.7°F | Resp 20 | Ht 65.0 in | Wt 315.8 lb

## 2018-07-11 DIAGNOSIS — K436 Other and unspecified ventral hernia with obstruction, without gangrene: Secondary | ICD-10-CM | POA: Diagnosis not present

## 2018-07-11 NOTE — Progress Notes (Signed)
Patient ID: Jamie Bennett, female   DOB: 03-18-1961, 57 y.o.   MRN: [id number]  Chief Complaint  Patient presents with  . Routine Post Op    Post op Exploritory laparotomy and hernia repair, Sx 03/07/18    HPI Jamie Bennett is a 57 y.o. female here today to follow up for Post op Exploritory laparotomy and hernia repair, Sx 03/07/18. Patient is here with her husband today and states she is feeling well.   HPI  Past Medical History:  Diagnosis Date  . Anemia   . Anxiety   . Bipolar 1 disorder ([place])   . Collagen vascular disease ([place])    rhematoid arthritis  . Constipation   . Fibromyalgia   . GERD (gastroesophageal reflux disease)   . Heart murmur    mild-asymptomatic  . Hepatic steatosis   . History of Roux-en-Y gastric bypass   . Hypotension   . Hypothyroidism   . Opioid abuse ([place])   . Osteoarthritis   . Osteoporosis   . PONV (postoperative nausea and vomiting)    nausea only  . Rheumatic fever   . Rheumatoid arthritis ([place])   . Thyroid disease     Past Surgical History:  Procedure Laterality Date  . CHOLECYSTECTOMY  2004  . COLONOSCOPY N/A 07/16/2017   Procedure: COLONOSCOPY;  Surgeon: Manya Silvas, MD;  [facility] ENDOSCOPY;  Service: Endoscopy;  Laterality: N/A;  . EXPLORATORY LAPAROTOMY  2009  . GASTRIC BYPASS  2003   [place] N/A 03/01/2018   Procedure: EXPLORATORY LAPAROTOMY/ UMBILECTOMY;  Surgeon: Robert Bellow, MD;  Location: [facility] ORS;  Service: General;  Laterality: N/A;  . TONSILLECTOMY    . VENTRAL HERNIA REPAIR N/A 03/07/2018   Procedure: HERNIA REPAIR VENTRAL ADULT;  Surgeon: Robert Bellow, MD;  Location: [facility] ORS;  Service: General;  Laterality: N/A;    Family History  Problem Relation Age of Onset  . Depression Mother   . Dementia Mother   . Heart disease Mother   . Parkinson's disease Father   . Colon cancer Neg Hx     Social History Social History   Tobacco Use  . Smoking status: Never  Smoker  . Smokeless tobacco: Never Used  Substance Use Topics  . Alcohol use: No    Alcohol/week: 0.0 standard drinks  . Drug use: No    Allergies  Allergen Reactions  . Lactose Intolerance (Gi) Other (See Comments)    Bloating and GI distress  . Sulfa Antibiotics Hives    Current Outpatient Medications  Medication Sig Dispense Refill  . aspirin EC 81 MG tablet Take 81 mg by mouth daily.    . busPIRone (BUSPAR) 15 MG tablet Take 1 tablet (15 mg total) by mouth 4 (four) times daily. 360 tablet 0  . Cholecalciferol (HM VITAMIN D3) 4000 units CAPS Take 4,000 Units by mouth daily.     . ciclopirox (PENLAC) 8 % solution Apply to affected nail and surrounding nail bed QHS 6.6 mL 2  . clonazePAM (KLONOPIN) 0.5 MG tablet Take one tablet For acute anxiety, emergencies only 30 tablet 0  . Coenzyme Q-10 200 MG CAPS Take 200 mg by mouth 2 (two) times daily.     . cyanocobalamin (,VITAMIN B-12,) 1000 MCG/ML injection Inject 1,000 mcg into the muscle every 30 (thirty) days.    Marland Kitchen desipramine (NORPRAMIN) 25 MG tablet 1  qam 30 tablet 0  . esomeprazole (NEXIUM) 20 MG capsule Take 20 mg by mouth 2 (  two) times daily before a meal.    . Fiber, Guar Gum, CHEW Chew 5 mg by mouth daily.     . hydroxychloroquine (PLAQUENIL) 200 MG tablet Take 1 tablet by mouth twice a day Monday through Friday 40 tablet 0  . hydrOXYzine (ATARAX/VISTARIL) 50 MG tablet Take 1 tablet (50 mg total) by mouth 3 (three) times daily as needed for anxiety. 270 tablet 1  . ibuprofen (ADVIL,MOTRIN) 100 MG tablet Take 100 mg by mouth as needed for fever.    Javier Docker Oil 500 MG CAPS Take 500 mg by mouth daily.    Marland Kitchen lamoTRIgine (LAMICTAL) 150 MG tablet Take 1 tablet (150 mg total) by mouth 2 (two) times daily. 180 tablet 0  . levothyroxine (SYNTHROID, LEVOTHROID) 112 MCG tablet Take 1 tablet (112 mcg total) by mouth daily. 90 tablet 1  . loratadine (CLARITIN) 10 MG tablet Take 10 mg by mouth daily as needed for allergies.    .  polyethylene glycol (MIRALAX) packet Take 17 g by mouth daily. 14 each 0  . sucralfate (CARAFATE) 1 g tablet Take 1 tablet (1 g total) by mouth 2 (two) times daily. 180 tablet 3  . tiZANidine (ZANAFLEX) 4 MG tablet Take 1 tablet (4 mg total) by mouth 3 (three) times daily as needed. 90 tablet 1  . traMADol (ULTRAM) 50 MG tablet Take 1 tablet (50 mg total) by mouth every 8 (eight) hours as needed. 24 tablet 0  . traZODone (DESYREL) 100 MG tablet Take 1 tablet (100 mg total) by mouth at bedtime as needed and may repeat dose one time if needed for sleep. 60 tablet 2  . venlafaxine (EFFEXOR) 100 MG tablet Take 1 tablet (100 mg total) by mouth 3 (three) times daily with meals. 90 tablet 4  . zoledronic acid (RECLAST) 5 MG/100ML SOLN injection Inject 5 mg into the vein See admin instructions. Patient takes yearly     No current facility-administered medications for this visit.     Review of Systems Review of Systems  Constitutional: Negative.   Respiratory: Negative.   Cardiovascular: Negative.     Blood pressure 130/87, pulse 90, temperature 97.7 F (36.5 C), temperature source Temporal, resp. rate 20, height 5\' 5"  (1.651 m), weight (!) 315 lb 12.8 oz (143.2 kg), SpO2 98 %.  Physical Exam Physical Exam Constitutional:      Appearance: She is well-developed.  Eyes:     General: No scleral icterus.    Conjunctiva/sclera: Conjunctivae normal.  Neck:     Musculoskeletal: Normal range of motion.  Cardiovascular:     Rate and Rhythm: Normal rate and regular rhythm.     Heart sounds: Normal heart sounds.  Pulmonary:     Effort: Pulmonary effort is normal.     Breath sounds: Normal breath sounds.  Chest:     Breasts: Breasts are symmetrical.        Right: No inverted nipple, mass, nipple discharge, skin change or tenderness.        Left: No inverted nipple, mass, nipple discharge, skin change or tenderness.  Abdominal:    Lymphadenopathy:     Cervical: No cervical adenopathy.  Skin:     General: Skin is warm and dry.  Neurological:     Mental Status: She is alert and oriented to person, place, and time.       Assessment    Doing well post ultrasound-guided aspiration of seroma post ventral hernia repair.    Plan Patient is to return to  the office as needed.  Increase activity as tolerated.  Judicious lifting encouraged.  HPI, Physical Exam, Assessment and Plan have been scribed under the direction and in the presence of Hervey Ard, Md.  Eudelia Bunch R. Bobette Mo, CMA  I have completed the exam and reviewed the above documentation for accuracy and completeness.  I agree with the above.  Haematologist has been used and any errors in dictation or transcription are unintentional.  Hervey Ard, M.D., F.A.C.S.  Forest Gleason Aayra Hornbaker 07/13/2018, 9:37 AM

## 2018-07-11 NOTE — Patient Instructions (Signed)
Patient is to return to the office as needed.  Call the office with any questions or concerns. 

## 2018-07-16 DIAGNOSIS — F603 Borderline personality disorder: Secondary | ICD-10-CM | POA: Diagnosis not present

## 2018-07-16 DIAGNOSIS — F4010 Social phobia, unspecified: Secondary | ICD-10-CM | POA: Diagnosis not present

## 2018-07-16 DIAGNOSIS — R69 Illness, unspecified: Secondary | ICD-10-CM | POA: Diagnosis not present

## 2018-07-17 ENCOUNTER — Encounter (HOSPITAL_COMMUNITY): Payer: Self-pay | Admitting: Psychiatry

## 2018-07-17 ENCOUNTER — Ambulatory Visit (INDEPENDENT_AMBULATORY_CARE_PROVIDER_SITE_OTHER): Payer: Medicare HMO | Admitting: Psychiatry

## 2018-07-17 VITALS — BP 123/74 | HR 73 | Ht 65.0 in | Wt 320.0 lb

## 2018-07-17 DIAGNOSIS — F332 Major depressive disorder, recurrent severe without psychotic features: Secondary | ICD-10-CM

## 2018-07-17 DIAGNOSIS — F411 Generalized anxiety disorder: Secondary | ICD-10-CM

## 2018-07-17 DIAGNOSIS — F339 Major depressive disorder, recurrent, unspecified: Secondary | ICD-10-CM

## 2018-07-17 DIAGNOSIS — F603 Borderline personality disorder: Secondary | ICD-10-CM | POA: Diagnosis not present

## 2018-07-17 DIAGNOSIS — R69 Illness, unspecified: Secondary | ICD-10-CM | POA: Diagnosis not present

## 2018-07-17 MED ORDER — VENLAFAXINE HCL 100 MG PO TABS: 100.0000 mg | ORAL_TABLET | Freq: Three times a day (TID) | ORAL | 4 refills | Status: DC

## 2018-07-17 MED ORDER — TRAZODONE HCL 100 MG PO TABS: 100.0000 mg | ORAL_TABLET | Freq: Every evening | ORAL | 5 refills | Status: DC | PRN

## 2018-07-17 MED ORDER — HYDROXYZINE HCL 50 MG PO TABS: 50.0000 mg | ORAL_TABLET | Freq: Three times a day (TID) | ORAL | 1 refills | Status: DC | PRN

## 2018-07-17 MED ORDER — BUSPIRONE HCL 15 MG PO TABS: 15.0000 mg | ORAL_TABLET | Freq: Four times a day (QID) | ORAL | 1 refills | Status: DC

## 2018-07-17 MED ORDER — DESIPRAMINE HCL 25 MG PO TABS: ORAL_TABLET | ORAL | 0 refills | Status: DC

## 2018-07-17 MED ORDER — LAMOTRIGINE 150 MG PO TABS: 150.0000 mg | ORAL_TABLET | Freq: Two times a day (BID) | ORAL | 1 refills | Status: DC

## 2018-07-17 NOTE — Progress Notes (Signed)
Psychiatric Initial Adult Assessment   Patient Identification: Jamie Bennett MRN:  [id number] Date of Evaluation:  07/17/2018 Referral Source: grams per previous psychiatrist Chief Complaint:   Visit Diagnosis: borderline personality disorder   ICD-10-CM   1. Anxiety state F41.1 venlafaxine (EFFEXOR) 100 MG tablet    busPIRone (BUSPAR) 15 MG tablet    hydrOXYzine (ATARAX/VISTARIL) 50 MG tablet  2. Borderline personality disorder ([place]) F60.3 venlafaxine (EFFEXOR) 100 MG tablet    traZODone (DESYREL) 100 MG tablet    lamoTRIgine (LAMICTAL) 150 MG tablet  3. Major depressive disorder, recurrent, severe without psychotic features (HCC) F33.2 venlafaxine (EFFEXOR) 100 MG tablet    traZODone (DESYREL) 100 MG tablet    lamoTRIgine (LAMICTAL) 150 MG tablet    History of Present Illness: Today the patient is seen with her husband [name].  She claims that she is somewhat better.  She still describes herself as somewhat energy less lethargic.  But her mood is a bit improved taking desipramine.  This patient has a history of chronic severe mental illness that is been suicidal in her life.  She also had a problem with alcohol abuse Provigil at one point.  She is diagnosed with borderline personality disorder presently is in DBT treatment and does well.  The patient generally is very pleasant.  She has chronic pain mainly related to her left knee.  The patient is morbidly obese.  She denies daily persistent depression at this time.  She says her anxiety is relatively well controlled although her husband drops in and says that she is anxious a lot.  Patient does not appear anxious in the interview.  She is sleeping and eating well has good energy at times.  She has no problems thinking and concentrating.  She is not suicidal.  The patient enjoys reading watching TV and getting on a computer.  The patient was looking forward to visiting her parents during Christmas but her husband says is just too far to  go.  Apparently her parents are in [place].  Her father is in a nursing home and is experiencing dementia.  The patient does have a son who is married and a 5-year-old grandson.  They are looking forward to seeing them during Christmas.  Overall the patient shows some mild improvement.  She denies the use of alcohol at this time.  She denies the use of illicit drugs.  Associated Signs/Symptoms: Depression Symptoms:  fatigue, (Hypo) Manic Symptoms:   Anxiety Symptoms:   Psychotic Symptoms:   PTSD Symptoms:   Past Psychiatric History: 10 psychiatric hospitalizations multiple psychotropic medications presently in psychotherapy  Previous Psychotropic Medications: Yes   Substance Abuse History in the last 12 months:  Yes.    Consequences of Substance Abuse:   Past Medical History:  Past Medical History:  Diagnosis Date  . Anemia   . Anxiety   . Bipolar 1 disorder ([place])   . Collagen vascular disease ([place])    rhematoid arthritis  . Constipation   . Fibromyalgia   . GERD (gastroesophageal reflux disease)   . Heart murmur    mild-asymptomatic  . Hepatic steatosis   . History of Roux-en-Y gastric bypass   . Hypotension   . Hypothyroidism   . Opioid abuse ([place])   . Osteoarthritis   . Osteoporosis   . PONV (postoperative nausea and vomiting)    nausea only  . Rheumatic fever   . Rheumatoid arthritis ([place])   . Thyroid disease     Past Surgical History:  Procedure Laterality Date  . CHOLECYSTECTOMY  2004  . COLONOSCOPY N/A 07/16/2017   Procedure: COLONOSCOPY;  Surgeon: Manya Silvas, MD;  Location: [facility] ENDOSCOPY;  Service: Endoscopy;  Laterality: N/A;  . EXPLORATORY LAPAROTOMY  2009  . GASTRIC BYPASS  2003   [place] N/A 03/01/2018   Procedure: EXPLORATORY LAPAROTOMY/ UMBILECTOMY;  Surgeon: Robert Bellow, MD;  Location: [facility] ORS;  Service: General;  Laterality: N/A;  . TONSILLECTOMY    . VENTRAL HERNIA REPAIR N/A 03/07/2018   Procedure: HERNIA  REPAIR VENTRAL ADULT;  Surgeon: Robert Bellow, MD;  Location: [facility] ORS;  Service: General;  Laterality: N/A;    Family Psychiatric History:   Family History:  Family History  Problem Relation Age of Onset  . Depression Mother   . Dementia Mother   . Heart disease Mother   . Parkinson's disease Father   . Colon cancer Neg Hx     Social History:   Social History   Socioeconomic History  . Marital status: Married    Spouse name: Not on file  . Number of children: Not on file  . Years of education: Not on file  . Highest education level: Not on file  Occupational History  . Not on file  Social Needs  . Financial resource strain: Not on file  . Food insecurity:    Worry: Not on file    Inability: Not on file  . Transportation needs:    Medical: Not on file    Non-medical: Not on file  Tobacco Use  . Smoking status: Never Smoker  . Smokeless tobacco: Never Used  Substance and Sexual Activity  . Alcohol use: No    Alcohol/week: 0.0 standard drinks  . Drug use: No  . Sexual activity: Never  Lifestyle  . Physical activity:    Days per week: Not on file    Minutes per session: Not on file  . Stress: Not on file  Relationships  . Social connections:    Talks on phone: Not on file    Gets together: Not on file    Attends religious service: Not on file    Active member of club or organization: Not on file    Attends meetings of clubs or organizations: Not on file    Relationship status: Not on file  Other Topics Concern  . Not on file  Social History Narrative  . Not on file    Additional Social History:   Allergies:   Allergies  Allergen Reactions  . Lactose Intolerance (Gi) Other (See Comments)    Bloating and GI distress  . Sulfa Antibiotics Hives    Metabolic Disorder Labs: No results found for: HGBA1C, MPG No results found for: PROLACTIN Lab Results  Component Value Date   TRIG 176 (H) 11/09/2015     Current Medications: Current Outpatient  Medications  Medication Sig Dispense Refill  . aspirin EC 81 MG tablet Take 81 mg by mouth daily.    . busPIRone (BUSPAR) 15 MG tablet Take 1 tablet (15 mg total) by mouth 4 (four) times daily. 360 tablet 1  . Cholecalciferol (HM VITAMIN D3) 4000 units CAPS Take 4,000 Units by mouth daily.     . ciclopirox (PENLAC) 8 % solution Apply to affected nail and surrounding nail bed QHS 6.6 mL 2  . clonazePAM (KLONOPIN) 0.5 MG tablet Take one tablet For acute anxiety, emergencies only 30 tablet 0  . Coenzyme Q-10 200 MG CAPS Take 200 mg  by mouth 2 (two) times daily.     . cyanocobalamin (,VITAMIN B-12,) 1000 MCG/ML injection Inject 1,000 mcg into the muscle every 30 (thirty) days.    Marland Kitchen desipramine (NORPRAMIN) 25 MG tablet 1  qam 30 tablet 0  . esomeprazole (NEXIUM) 20 MG capsule Take 20 mg by mouth 2 (two) times daily before a meal.    . Fiber, Guar Gum, CHEW Chew 5 mg by mouth daily.     . hydroxychloroquine (PLAQUENIL) 200 MG tablet Take 1 tablet by mouth twice a day Monday through Friday 40 tablet 0  . hydrOXYzine (ATARAX/VISTARIL) 50 MG tablet Take 1 tablet (50 mg total) by mouth 3 (three) times daily as needed for anxiety. 270 tablet 1  . ibuprofen (ADVIL,MOTRIN) 100 MG tablet Take 100 mg by mouth as needed for fever.    Javier Docker Oil 500 MG CAPS Take 500 mg by mouth daily.    Marland Kitchen lamoTRIgine (LAMICTAL) 150 MG tablet Take 1 tablet (150 mg total) by mouth 2 (two) times daily. 180 tablet 1  . levothyroxine (SYNTHROID, LEVOTHROID) 112 MCG tablet Take 1 tablet (112 mcg total) by mouth daily. 90 tablet 1  . loratadine (CLARITIN) 10 MG tablet Take 10 mg by mouth daily as needed for allergies.    . polyethylene glycol (MIRALAX) packet Take 17 g by mouth daily. 14 each 0  . sucralfate (CARAFATE) 1 g tablet Take 1 tablet (1 g total) by mouth 2 (two) times daily. 180 tablet 3  . tiZANidine (ZANAFLEX) 4 MG tablet Take 1 tablet (4 mg total) by mouth 3 (three) times daily as needed. 90 tablet 1  . traMADol  (ULTRAM) 50 MG tablet Take 1 tablet (50 mg total) by mouth every 8 (eight) hours as needed. 24 tablet 0  . traZODone (DESYREL) 100 MG tablet Take 1 tablet (100 mg total) by mouth at bedtime as needed and may repeat dose one time if needed for sleep. 60 tablet 5  . venlafaxine (EFFEXOR) 100 MG tablet Take 1 tablet (100 mg total) by mouth 3 (three) times daily with meals. 90 tablet 4  . zoledronic acid (RECLAST) 5 MG/100ML SOLN injection Inject 5 mg into the vein See admin instructions. Patient takes yearly     No current facility-administered medications for this visit.     Neurologic: Headache: No Seizure: No Paresthesias:NA  Musculoskeletal: Strength & Muscle Tone: within normal limits Gait & Station: normal Patient leans: N/A  Psychiatric Specialty Exam: ROS  Blood pressure 123/74, pulse 73, height 5\' 5"  (1.651 m), weight (!) 320 lb (145.2 kg).Body mass index is 53.25 kg/m.  General Appearance: Bizarre  Eye Contact:  Good  Speech:  Normal Rate  Volume:  Normal  Mood:  Anxious  Affect:  Congruent  Thought Process:  Goal Directed  Orientation:  NA  Thought Content:  Logical  Suicidal Thoughts:  No  Homicidal Thoughts:  No  Memory:  Negative  Judgement:  Fair  Insight:  NA and Good  Psychomotor Activity:  Normal  Concentration:    Recall:  Good  Fund of Knowledge:  Language: Good  Akathisia:  No  Handed:  Right  AIMS (if indicated):    Assets:  Desire for Improvement  ADL's:  Intact  Cognition: WNL  Sleep:      Treatment Plan Summary: This patient is #1 problem is borderline personality disorder.  She is getting DBT treatment for that.  She also takes Lamictal as a mood stabilizer.  Her second problem is that  of clinical depression.  The patient presently takes a high dose of Effexor 100 mg 3 times daily.  She also has significant associated anxiety.  She takes 30 mg twice daily for that state.  The patient is asking for the possibility of using Provigil but I am  hesitant to prescribe that.  Patient now is taking desipramine which my hope was that it would help her mood and her energy.  I think it has helped her mood a little bit.  Today her recommendations were Dr. Leafy Ro for treatment of her morbid obesity.  Losing weight seems to be a major medical problem in this patient.  The patient has had gastric bypass surgery which apparently was not all that helpful.  To lose weight I think would help her mood but more importantly would make her able to have knee surgery.  Had knee surgery will reduce her pain which I think would have a positive effect on her mood.  Patient is very active.  Losing weight and getting more active I think is a key part of treatment for her depression.  In some ways a sense the patient is medication seeking.  At this time we will hold off with any changes.  She will come back and see me in 3 months for a 30-minute visit.  The patient is not suicidal.  She seems to be functioning fairly well.  All over her symptomatology seems to be moderate in nature.  Jerral Ralph, MD 12/18/20193:34 PM

## 2018-07-18 ENCOUNTER — Inpatient Hospital Stay: Payer: Medicare HMO | Attending: Oncology

## 2018-07-18 ENCOUNTER — Inpatient Hospital Stay: Payer: Medicare HMO

## 2018-07-18 DIAGNOSIS — D509 Iron deficiency anemia, unspecified: Secondary | ICD-10-CM

## 2018-07-18 DIAGNOSIS — Z79899 Other long term (current) drug therapy: Secondary | ICD-10-CM | POA: Insufficient documentation

## 2018-07-18 DIAGNOSIS — K9589 Other complications of other bariatric procedure: Secondary | ICD-10-CM

## 2018-07-18 DIAGNOSIS — E538 Deficiency of other specified B group vitamins: Secondary | ICD-10-CM | POA: Diagnosis not present

## 2018-07-18 DIAGNOSIS — D508 Other iron deficiency anemias: Secondary | ICD-10-CM

## 2018-07-18 LAB — CBC WITH DIFFERENTIAL/PLATELET
Abs Immature Granulocytes: 0.01 10*3/uL (ref 0.00–0.07)
Basophils Absolute: 0 10*3/uL (ref 0.0–0.1)
Basophils Relative: 0 %
Eosinophils Absolute: 0.2 10*3/uL (ref 0.0–0.5)
Eosinophils Relative: 4 %
HCT: 38.5 % (ref 36.0–46.0)
Hemoglobin: 12.4 g/dL (ref 12.0–15.0)
Immature Granulocytes: 0 %
Lymphocytes Relative: 34 %
Lymphs Abs: 1.8 10*3/uL (ref 0.7–4.0)
MCH: 29.5 pg (ref 26.0–34.0)
MCHC: 32.2 g/dL (ref 30.0–36.0)
MCV: 91.4 fL (ref 80.0–100.0)
Monocytes Absolute: 0.5 10*3/uL (ref 0.1–1.0)
Monocytes Relative: 9 %
Neutro Abs: 2.7 10*3/uL (ref 1.7–7.7)
Neutrophils Relative %: 53 %
Platelets: 195 10*3/uL (ref 150–400)
RBC: 4.21 MIL/uL (ref 3.87–5.11)
RDW: 13.4 % (ref 11.5–15.5)
WBC: 5.2 10*3/uL (ref 4.0–10.5)
nRBC: 0 % (ref 0.0–0.2)

## 2018-07-18 LAB — FERRITIN: Ferritin: 47 ng/mL (ref 11–307)

## 2018-07-18 MED ORDER — CYANOCOBALAMIN 1000 MCG/ML IJ SOLN
1000.0000 ug | Freq: Once | INTRAMUSCULAR | Status: AC
  Administered 2018-07-18: 1000 ug via INTRAMUSCULAR

## 2018-07-29 DIAGNOSIS — N6321 Unspecified lump in the left breast, upper outer quadrant: Secondary | ICD-10-CM | POA: Diagnosis not present

## 2018-07-29 DIAGNOSIS — R928 Other abnormal and inconclusive findings on diagnostic imaging of breast: Secondary | ICD-10-CM | POA: Diagnosis not present

## 2018-08-01 ENCOUNTER — Other Ambulatory Visit: Payer: Self-pay | Admitting: Rheumatology

## 2018-08-01 ENCOUNTER — Telehealth: Payer: Self-pay | Admitting: Rheumatology

## 2018-08-01 NOTE — Telephone Encounter (Signed)
Patient left a voicemail requesting prescription refill of Plaquenil (3 month supply) to be sent to [facility].

## 2018-08-01 NOTE — Telephone Encounter (Signed)
Patient advised prescription has been sent to the pharmacy for a 3 month supply.

## 2018-08-01 NOTE — Telephone Encounter (Signed)
Last visit: 03/19/18 Next visit: 08/20/17 Labs: 03/10/18 Stable PLQ eye exam: 06/24/18 WNL   Okay to refill per Dr. Estanislado Pandy

## 2018-08-05 ENCOUNTER — Other Ambulatory Visit (HOSPITAL_COMMUNITY): Payer: Self-pay

## 2018-08-05 MED ORDER — DESIPRAMINE HCL 25 MG PO TABS: ORAL_TABLET | ORAL | 0 refills | Status: DC

## 2018-08-06 ENCOUNTER — Other Ambulatory Visit (HOSPITAL_COMMUNITY): Payer: Self-pay

## 2018-08-06 DIAGNOSIS — F603 Borderline personality disorder: Secondary | ICD-10-CM | POA: Diagnosis not present

## 2018-08-06 DIAGNOSIS — F411 Generalized anxiety disorder: Secondary | ICD-10-CM

## 2018-08-06 DIAGNOSIS — F4010 Social phobia, unspecified: Secondary | ICD-10-CM | POA: Diagnosis not present

## 2018-08-06 DIAGNOSIS — R69 Illness, unspecified: Secondary | ICD-10-CM | POA: Diagnosis not present

## 2018-08-06 MED ORDER — CLONAZEPAM 0.5 MG PO TABS: ORAL_TABLET | ORAL | 0 refills | Status: DC

## 2018-08-07 NOTE — Progress Notes (Signed)
Office Visit Note  Patient: Jamie Bennett             Date of Birth: 06-Jun-1961           MRN: [id number]             PCP: Ronnell Freshwater, NP Referring: Ronnell Freshwater, NP Visit Date: 08/20/2018 Occupation: @GUAROCC @  Subjective:  Pain in both knee joints    History of Present Illness: Jamie Bennett is a 58 y.o. female  With history of seropositive rheumatoid arthritis, DDD, fibromyalgia, and osteoarthritis.  She is taking PLQ 200 mg BID M-F. She has not missed any doses of PLQ recently.  She has not had any recent infections.  She reports that she has been having increased pain and intermittent joint swelling in bilateral hands with the recent weather changes.  She states that she continues to have chronic pain in bilateral knee joints.  She states that she is also been having increased stiffness.  She states occasionally she will experience a sharp pain in the right knee especially when she has pivoting.  She denies any buckling sensation.  She reports that she has occasional neck and lower back discomfort.  She continues have generalized muscle aches muscle tenderness due to fibromyalgia.  She rates her fibromyalgia pain is 3 out of 10 at this time.  She states that overall her level of fatigue has been stable.   Activities of Daily Living:  Patient reports morning stiffness for 1 hour.   Patient Denies nocturnal pain.  Difficulty dressing/grooming: Denies Difficulty climbing stairs: Reports Difficulty getting out of chair: Reports Difficulty using hands for taps, buttons, cutlery, and/or writing: Denies  Review of Systems  Constitutional: Positive for fatigue.  HENT: Negative for mouth sores, mouth dryness and nose dryness.   Eyes: Negative for pain, visual disturbance and dryness.  Respiratory: Negative for cough, hemoptysis, shortness of breath and difficulty breathing.   Cardiovascular: Negative for chest pain, palpitations, hypertension and swelling in  legs/feet.  Gastrointestinal: Negative for blood in stool, constipation and diarrhea.  Endocrine: Negative for increased urination.  Genitourinary: Negative for painful urination.  Musculoskeletal: Positive for arthralgias, joint pain, joint swelling, myalgias, morning stiffness, muscle tenderness and myalgias. Negative for muscle weakness.  Skin: Negative for color change, pallor, rash, hair loss, nodules/bumps, skin tightness, ulcers and sensitivity to sunlight.  Allergic/Immunologic: Negative for susceptible to infections.  Neurological: Negative for dizziness, numbness, headaches and weakness.  Hematological: Negative for swollen glands.  Psychiatric/Behavioral: Positive for depressed mood and sleep disturbance. The patient is nervous/anxious.     PMFS History:  Patient Active Problem List   Diagnosis Date Noted  . Screening for breast cancer 05/29/2018  . Duodenal ulcer disease 05/29/2018  . Screening for malignant neoplasm of cervix 05/29/2018  . Dysuria 05/29/2018  . Surgical wound dehiscence, initial encounter 04/14/2018  . Postoperative abdominal hernia with obstruction   . Incarcerated ventral hernia 03/22/2018  . SBO (small bowel obstruction) ([place]) 03/07/2018  . Abscess of abdominal wall 03/01/2018  . Persistent umbilical sinus 20/25/4270  . Tinea pedis of both feet 01/23/2018  . Atopic dermatitis 12/05/2017  . Adjustment disorder with mixed anxiety and depressed mood 03/10/2017  . Major depressive disorder 03/10/2017  . Primary osteoarthritis of both knees 01/19/2017  . Suicide attempt ([place]) 07/10/2016  . Fibromyalgia 07/10/2016  . High risk medication use 07/10/2016  . AKI (acute kidney injury) ([place]) 11/09/2015  . Elevated troponin 11/09/2015  . Hypotension 11/09/2015  .  Respiratory failure ([place])   . Acute hepatic failure 11/08/2015  . Drug overdose 11/08/2015  . Fatty infiltration of liver 02/16/2015  . Hepatic fibrosis 02/16/2015  . Abnormal serum level of  alkaline phosphatase 02/15/2015  . Iron deficiency anemia 02/05/2015  . Vitamin B 12 deficiency 02/05/2015  . OP (osteoporosis) 06/16/2014  . Rheumatoid arteritis ([place]) 03/02/2014  . Hypothyroidism 03/02/2014  . Rheumatic fever without heart involvement 03/02/2014  . Adult hypothyroidism 03/02/2014  . Arthritis of pelvic region, degenerative 03/02/2014  . Bipolar 1 disorder, depressed ([place]) 02/26/2014  . Bariatric surgery status 11/24/2013  . Affective bipolar disorder ([place]) 11/24/2013  . Bipolar affective disorder ([place]) 11/24/2013  . Rheumatoid arthritis ([place]) 11/24/2013  . Polysubstance (excluding opioids) dependence ([place]) 09/11/2013  . Polysubstance dependence ([place]) 09/11/2013  . Combined drug dependence excluding opioids ([place]) 09/11/2013  . Arthritis or polyarthritis, rheumatoid ([place]) 09/05/2013  . Leg weakness 09/05/2013    Past Medical History:  Diagnosis Date  . Anemia   . Anxiety   . Bipolar 1 disorder ([place])   . Collagen vascular disease ([place])    rhematoid arthritis  . Constipation   . Fibromyalgia   . GERD (gastroesophageal reflux disease)   . Heart murmur    mild-asymptomatic  . Hepatic steatosis   . History of Roux-en-Y gastric bypass   . Hypotension   . Hypothyroidism   . Opioid abuse ([place])   . Osteoarthritis   . Osteoporosis   . PONV (postoperative nausea and vomiting)    nausea only  . Rheumatic fever   . Rheumatoid arthritis ([place])   . Thyroid disease     Family History  Problem Relation Age of Onset  . Depression Mother   . Dementia Mother   . Heart disease Mother   . Parkinson's disease Father   . Colon cancer Neg Hx    Past Surgical History:  Procedure Laterality Date  . CHOLECYSTECTOMY  2004  . COLONOSCOPY N/A 07/16/2017   Procedure: COLONOSCOPY;  Surgeon: Manya Silvas, MD;  Location: [facility] ENDOSCOPY;  Service: Endoscopy;  Laterality: N/A;  . EXPLORATORY LAPAROTOMY  2009  . GASTRIC BYPASS  2003   [place] N/A  03/01/2018   Procedure: EXPLORATORY LAPAROTOMY/ UMBILECTOMY;  Surgeon: Robert Bellow, MD;  Location: [facility] ORS;  Service: General;  Laterality: N/A;  . TONSILLECTOMY    . VENTRAL HERNIA REPAIR N/A 03/07/2018   Procedure: HERNIA REPAIR VENTRAL ADULT;  Surgeon: Robert Bellow, MD;  Location: [facility] ORS;  Service: General;  Laterality: N/A;   Social History   Social History Narrative  . Not on file    Objective: Vital Signs: BP 121/82 (BP Location: Left Wrist, Patient Position: Sitting, Cuff Size: Normal)   Pulse 77   Resp 16   Ht 5\' 5"  (1.651 m)   Wt (!) 324 lb 6.4 oz (147.1 kg)   BMI 53.98 kg/m    Physical Exam Vitals signs and nursing note reviewed.  Constitutional:      Appearance: She is well-developed.  HENT:     Head: Normocephalic and atraumatic.  Eyes:     Conjunctiva/sclera: Conjunctivae normal.  Neck:     Musculoskeletal: Normal range of motion.  Cardiovascular:     Rate and Rhythm: Normal rate and regular rhythm.     Heart sounds: Normal heart sounds.  Pulmonary:     Effort: Pulmonary effort is normal.     Breath sounds: Normal breath sounds.  Abdominal:     General: Bowel  sounds are normal.     Palpations: Abdomen is soft.  Lymphadenopathy:     Cervical: No cervical adenopathy.  Skin:    General: Skin is warm and dry.     Capillary Refill: Capillary refill takes less than 2 seconds.  Neurological:     Mental Status: She is alert and oriented to person, place, and time.  Psychiatric:        Behavior: Behavior normal.      Musculoskeletal Exam: C-spine good ROM.  Mild thoracic kyphosis noted.  Limited ROM of lumbar spine.  No midline spinal tenderness.  No SI joint tenderness.  Shoulder joints, elbow joints, wrist joints, MCPs, PIPs, and DIPs good ROM with no synovitis.  Complete fist formation bilaterally.  Hip joints good ROM with no discomfort.  Knee joints good ROM.  Mild warmth of right knee joint on exam.  No effusion noted.  No tenderness or  swelling of ankle joints.  No tenderness of MTPs.    CDAI Exam: CDAI Score: 0.8  Patient Global Assessment: 4 (mm); Provider Global Assessment: 4 (mm) Swollen: 0 ; Tender: 0  Joint Exam   Not documented   There is currently no information documented on the homunculus. Go to the Rheumatology activity and complete the homunculus joint exam.  Investigation: No additional findings.  Imaging: Xr Knee 3 View Left  Result Date: 08/20/2018 Mild medial compartment narrowing was noted.  Intercondylar osteophytes were noted.  No chondrocalcinosis was noted.  Mild patellofemoral narrowing was noted. Impression: These findings are consistent with mild osteoarthritis and mild chondromalacia patella.  Xr Knee 3 View Right  Result Date: 08/20/2018 Moderate medial compartment narrowing with intercondylar osteophytes was noted.  No chondrocalcinosis was noted.  Moderate patellofemoral narrowing was noted. Impression: These findings are consistent with moderate osteoarthritis and moderate chondromalacia patella.   Recent Labs: Lab Results  Component Value Date   WBC 5.2 07/18/2018   HGB 12.4 07/18/2018   PLT 195 07/18/2018   NA 145 03/12/2018   K 3.8 03/12/2018   CL 110 03/12/2018   CO2 28 03/12/2018   GLUCOSE 102 (H) 03/12/2018   BUN 6 03/12/2018   CREATININE 0.90 04/24/2018   BILITOT 0.7 03/07/2018   ALKPHOS 218 (H) 03/07/2018   AST 31 03/07/2018   ALT 65 (H) 03/07/2018   PROT 7.9 03/07/2018   ALBUMIN 3.9 03/07/2018   CALCIUM 8.4 (L) 03/12/2018   GFRAA >60 03/12/2018    Speciality Comments:  PLQ Eye Exam: 06/24/18 WNL @ [place] follow up in 1 year  Procedures:  Large Joint Inj: bilateral knee on 08/20/2018 3:53 PM Indications: pain Details: 27 G 1.5 in needle, medial approach  Arthrogram: No  Medications (Right): 1.5 mL lidocaine 1 %; 40 mg triamcinolone acetonide 40 MG/ML Aspirate (Right): 0 mL Medications (Left): 1.5 mL lidocaine 1 %; 40 mg triamcinolone  acetonide 40 MG/ML Aspirate (Left): 0 mL Outcome: tolerated well, no immediate complications Procedure, treatment alternatives, risks and benefits explained, specific risks discussed. Consent was given by the patient. Immediately prior to procedure a time out was called to verify the correct patient, procedure, equipment, support staff and site/side marked as required. Patient was prepped and draped in the usual sterile fashion.     Allergies: Lactose intolerance (gi) and Sulfa antibiotics   Assessment / Plan:     Visit Diagnoses: Rheumatoid arthritis involving multiple sites with positive rheumatoid factor ([place]): She has no synovitis on exam.  She has not had any recent rheumatoid arthritis flares.  She is clinically doing well on PLQ 200 mg BID M-F.  She has intermittent pain in both hands but no synovitis noted.  She has chronic bilateral knee joint pain.  She requested bilateral knee joint cortisone injections today.  She tolerated the procedure well.  She will continue on PLQ 200 mg BID M-F.  She does not need a refill at this time.  She was advised to notify us if she develops increased joint pain or joint swelling.  She will follow up in 5 months.   High risk medication use - Plaquenil 200 mg twice daily Monday through Friday.  Last Plaquenil eye exam normal on 06/24/2018.  Most recent CBC showed continued decline of anemia on 07/18/2018.  Most recent BMP within normal limits except low calcium.  We will recheck CMP today.- Plan: COMPLETE METABOLIC PANEL WITH GFR  Primary osteoarthritis of both knees: She has chronic pain in bilateral knee joints.  She has mild right knee warmth on exam.  She has been trying to walk and exercise on a regular basis but her knee pain has been interfering.  She occasionally experiences a sharp pain in the right knee joint when she has been running.  She has not experienced any buckling sensation in the past.  She requested bilateral knee joint cortisone injections  today.  X-rays of both knees were obtained today.  After informed consent bilateral knee joints were injected with cortisone.  She tolerated procedure well.  Procedure note is completed above.  She is advised to monitor blood pressure closely upon the cortisone injections.  Chronic pain of both knees -She has been having increased discomfort in bilateral knee joints.  X-rays of both knees were obtained today.  After informed consent bilateral knees were injected with cortisone.  She tolerated the procedure well.  Procedure note is completed above.  Plan: XR KNEE 3 VIEW LEFT, XR KNEE 3 VIEW RIGHT   Fibromyalgia: She continues to have generalized muscle aches and muscle tenderness due to fibromyalgia.  She has chronic fatigue and insomnia.  Her fatigue has been stable recently.  She has been trying to be more active and exercise on a regular basis.  DDD (degenerative disc disease), cervical: Good range of motion with no discomfort.  She has no symptoms of radiculopathy at this time.  DDD (degenerative disc disease), thoracic: No midline spinal tenderness.  DDD (degenerative disc disease), lumbar: She has intermittent lower back pain.  Several days ago she was experiencing increased discomfort with the recent weather changes.  Other osteoporosis without current pathological fracture - She receives Reclast IV infusions.  Other medical conditions are listed as follows:  History of hypothyroidism  History of gastroesophageal reflux (GERD)  History of bipolar disorder  History of suicide attempt  History of anemia    Orders: Orders Placed This Encounter  Procedures  . Large Joint Inj: bilateral knee  . XR KNEE 3 VIEW LEFT  . XR KNEE 3 VIEW RIGHT  . COMPLETE METABOLIC PANEL WITH GFR   No orders of the defined types were placed in this encounter.   Face-to-face time spent with patient was 30 minutes. Greater than 50% of time was spent in counseling and coordination of care.  Follow-Up  Instructions: Return in about 5 months (around 01/19/2019) for Rheumatoid arthritis, Fibromyalgia, Osteoarthritis, DDD.   Ofilia Neas, PA-C  Note - This record has been created using Dragon software.  Chart creation errors have been sought, but may not always  have been located.  Such creation errors do not reflect on  the standard of medical care.

## 2018-08-11 ENCOUNTER — Other Ambulatory Visit (HOSPITAL_COMMUNITY): Payer: Self-pay | Admitting: Psychiatry

## 2018-08-11 DIAGNOSIS — F603 Borderline personality disorder: Secondary | ICD-10-CM

## 2018-08-11 DIAGNOSIS — F332 Major depressive disorder, recurrent severe without psychotic features: Secondary | ICD-10-CM

## 2018-08-15 ENCOUNTER — Inpatient Hospital Stay: Payer: Medicare HMO | Attending: Oncology

## 2018-08-15 DIAGNOSIS — E538 Deficiency of other specified B group vitamins: Secondary | ICD-10-CM | POA: Diagnosis not present

## 2018-08-15 MED ORDER — CYANOCOBALAMIN 1000 MCG/ML IJ SOLN
1000.0000 ug | Freq: Once | INTRAMUSCULAR | Status: AC
  Administered 2018-08-15: 1000 ug via INTRAMUSCULAR

## 2018-08-20 ENCOUNTER — Encounter: Payer: Self-pay | Admitting: Physician Assistant

## 2018-08-20 ENCOUNTER — Ambulatory Visit: Payer: Medicare HMO | Admitting: Physician Assistant

## 2018-08-20 ENCOUNTER — Ambulatory Visit (INDEPENDENT_AMBULATORY_CARE_PROVIDER_SITE_OTHER): Payer: Medicare HMO

## 2018-08-20 ENCOUNTER — Ambulatory Visit (INDEPENDENT_AMBULATORY_CARE_PROVIDER_SITE_OTHER): Payer: Self-pay

## 2018-08-20 VITALS — BP 121/82 | HR 77 | Resp 16 | Ht 65.0 in | Wt 324.4 lb

## 2018-08-20 DIAGNOSIS — M797 Fibromyalgia: Secondary | ICD-10-CM | POA: Diagnosis not present

## 2018-08-20 DIAGNOSIS — G8929 Other chronic pain: Secondary | ICD-10-CM

## 2018-08-20 DIAGNOSIS — M0579 Rheumatoid arthritis with rheumatoid factor of multiple sites without organ or systems involvement: Secondary | ICD-10-CM

## 2018-08-20 DIAGNOSIS — M25561 Pain in right knee: Secondary | ICD-10-CM

## 2018-08-20 DIAGNOSIS — M5136 Other intervertebral disc degeneration, lumbar region: Secondary | ICD-10-CM

## 2018-08-20 DIAGNOSIS — Z8639 Personal history of other endocrine, nutritional and metabolic disease: Secondary | ICD-10-CM

## 2018-08-20 DIAGNOSIS — M25562 Pain in left knee: Secondary | ICD-10-CM | POA: Diagnosis not present

## 2018-08-20 DIAGNOSIS — M5134 Other intervertebral disc degeneration, thoracic region: Secondary | ICD-10-CM

## 2018-08-20 DIAGNOSIS — Z8719 Personal history of other diseases of the digestive system: Secondary | ICD-10-CM

## 2018-08-20 DIAGNOSIS — Z79899 Other long term (current) drug therapy: Secondary | ICD-10-CM

## 2018-08-20 DIAGNOSIS — M818 Other osteoporosis without current pathological fracture: Secondary | ICD-10-CM

## 2018-08-20 DIAGNOSIS — M51369 Other intervertebral disc degeneration, lumbar region without mention of lumbar back pain or lower extremity pain: Secondary | ICD-10-CM

## 2018-08-20 DIAGNOSIS — Z862 Personal history of diseases of the blood and blood-forming organs and certain disorders involving the immune mechanism: Secondary | ICD-10-CM

## 2018-08-20 DIAGNOSIS — M170 Bilateral primary osteoarthritis of knee: Secondary | ICD-10-CM | POA: Diagnosis not present

## 2018-08-20 DIAGNOSIS — Z8659 Personal history of other mental and behavioral disorders: Secondary | ICD-10-CM

## 2018-08-20 DIAGNOSIS — Z915 Personal history of self-harm: Secondary | ICD-10-CM

## 2018-08-20 DIAGNOSIS — M5030 Other cervical disc degeneration, unspecified cervical region: Secondary | ICD-10-CM

## 2018-08-20 DIAGNOSIS — R69 Illness, unspecified: Secondary | ICD-10-CM | POA: Diagnosis not present

## 2018-08-20 DIAGNOSIS — Z9151 Personal history of suicidal behavior: Secondary | ICD-10-CM

## 2018-08-20 LAB — COMPLETE METABOLIC PANEL WITH GFR
AG Ratio: 1.8 (calc) (ref 1.0–2.5)
ALT: 13 U/L (ref 6–29)
AST: 19 U/L (ref 10–35)
Albumin: 4.2 g/dL (ref 3.6–5.1)
Alkaline phosphatase (APISO): 93 U/L (ref 33–130)
BUN/Creatinine Ratio: 13 (calc) (ref 6–22)
BUN: 15 mg/dL (ref 7–25)
CO2: 20 mmol/L (ref 20–32)
Calcium: 9.1 mg/dL (ref 8.6–10.4)
Chloride: 110 mmol/L (ref 98–110)
Creat: 1.15 mg/dL — ABNORMAL HIGH (ref 0.50–1.05)
GFR, Est African American: 61 mL/min/{1.73_m2} (ref >=60)
GFR, Est Non African American: 52 mL/min/{1.73_m2} — ABNORMAL LOW (ref >=60)
Globulin: 2.4 g/dL (calc) (ref 1.9–3.7)
Glucose, Bld: 89 mg/dL (ref 65–99)
Potassium: 4.5 mmol/L (ref 3.5–5.3)
Sodium: 140 mmol/L (ref 135–146)
Total Bilirubin: 0.3 mg/dL (ref 0.2–1.2)
Total Protein: 6.6 g/dL (ref 6.1–8.1)

## 2018-08-20 MED ORDER — LIDOCAINE HCL 1 % IJ SOLN
1.5000 mL | INTRAMUSCULAR | Status: AC | PRN
  Administered 2018-08-20: 1.5 mL

## 2018-08-20 MED ORDER — TRIAMCINOLONE ACETONIDE 40 MG/ML IJ SUSP
40.0000 mg | INTRAMUSCULAR | Status: AC | PRN
  Administered 2018-08-20: 40 mg via INTRA_ARTICULAR

## 2018-08-21 NOTE — Progress Notes (Signed)
Creatinine elevated and GFR is low. Please advise patient to avoid NSAIDs. Forward labs to PCP. Rest of CMP WNL.

## 2018-08-28 ENCOUNTER — Other Ambulatory Visit: Payer: Self-pay

## 2018-08-28 MED ORDER — TIZANIDINE HCL 4 MG PO TABS: 4.0000 mg | ORAL_TABLET | Freq: Three times a day (TID) | ORAL | 1 refills | Status: DC | PRN

## 2018-09-03 DIAGNOSIS — F4010 Social phobia, unspecified: Secondary | ICD-10-CM | POA: Diagnosis not present

## 2018-09-03 DIAGNOSIS — R69 Illness, unspecified: Secondary | ICD-10-CM | POA: Diagnosis not present

## 2018-09-03 DIAGNOSIS — F603 Borderline personality disorder: Secondary | ICD-10-CM | POA: Diagnosis not present

## 2018-09-04 DIAGNOSIS — M810 Age-related osteoporosis without current pathological fracture: Secondary | ICD-10-CM | POA: Diagnosis not present

## 2018-09-09 ENCOUNTER — Other Ambulatory Visit (HOSPITAL_COMMUNITY): Payer: Self-pay | Admitting: Psychiatry

## 2018-09-09 DIAGNOSIS — M810 Age-related osteoporosis without current pathological fracture: Secondary | ICD-10-CM | POA: Diagnosis not present

## 2018-09-09 DIAGNOSIS — M069 Rheumatoid arthritis, unspecified: Secondary | ICD-10-CM | POA: Diagnosis not present

## 2018-09-09 DIAGNOSIS — F411 Generalized anxiety disorder: Secondary | ICD-10-CM

## 2018-09-11 ENCOUNTER — Other Ambulatory Visit (HOSPITAL_COMMUNITY): Payer: Self-pay

## 2018-09-11 DIAGNOSIS — M810 Age-related osteoporosis without current pathological fracture: Secondary | ICD-10-CM | POA: Diagnosis not present

## 2018-09-12 ENCOUNTER — Inpatient Hospital Stay: Payer: Medicare HMO

## 2018-09-16 ENCOUNTER — Inpatient Hospital Stay: Payer: Medicare HMO | Attending: Oncology

## 2018-09-16 DIAGNOSIS — E538 Deficiency of other specified B group vitamins: Secondary | ICD-10-CM | POA: Insufficient documentation
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
## 2018-09-16 MED ORDER — CYANOCOBALAMIN 1000 MCG/ML IJ SOLN
1000.0000 ug | Freq: Once | INTRAMUSCULAR | Status: AC
  Administered 2018-09-16: 1000 ug via INTRAMUSCULAR

## 2018-09-17 DIAGNOSIS — F4010 Social phobia, unspecified: Secondary | ICD-10-CM | POA: Diagnosis not present

## 2018-09-17 DIAGNOSIS — R69 Illness, unspecified: Secondary | ICD-10-CM | POA: Diagnosis not present

## 2018-09-17 DIAGNOSIS — F603 Borderline personality disorder: Secondary | ICD-10-CM | POA: Diagnosis not present

## 2018-10-01 DIAGNOSIS — F4010 Social phobia, unspecified: Secondary | ICD-10-CM | POA: Diagnosis not present

## 2018-10-01 DIAGNOSIS — R69 Illness, unspecified: Secondary | ICD-10-CM | POA: Diagnosis not present

## 2018-10-01 DIAGNOSIS — F603 Borderline personality disorder: Secondary | ICD-10-CM | POA: Diagnosis not present

## 2018-10-07 ENCOUNTER — Other Ambulatory Visit: Payer: Self-pay | Admitting: Rheumatology

## 2018-10-09 ENCOUNTER — Other Ambulatory Visit: Payer: Self-pay | Admitting: *Deleted

## 2018-10-09 MED ORDER — HYDROXYCHLOROQUINE SULFATE 200 MG PO TABS: ORAL_TABLET | ORAL | 0 refills | Status: DC

## 2018-10-09 NOTE — Telephone Encounter (Signed)
Refill request received via fax  Last visit: 09/09/18 Next visit: 01/16/19 Labs: 08/20/18 Creatinine elevated and GFR is low Rest of CMP WNL. CBC WNL  PLQ Eye Exam: 06/24/18 WNL   Okay to refill per Dr. Estanislado Pandy

## 2018-10-11 ENCOUNTER — Other Ambulatory Visit (HOSPITAL_COMMUNITY): Payer: Self-pay | Admitting: Psychiatry

## 2018-10-11 DIAGNOSIS — F411 Generalized anxiety disorder: Secondary | ICD-10-CM

## 2018-10-15 DIAGNOSIS — R69 Illness, unspecified: Secondary | ICD-10-CM | POA: Diagnosis not present

## 2018-10-15 DIAGNOSIS — F4010 Social phobia, unspecified: Secondary | ICD-10-CM | POA: Diagnosis not present

## 2018-10-15 DIAGNOSIS — F603 Borderline personality disorder: Secondary | ICD-10-CM | POA: Diagnosis not present

## 2018-10-16 ENCOUNTER — Ambulatory Visit (INDEPENDENT_AMBULATORY_CARE_PROVIDER_SITE_OTHER): Payer: Medicare HMO | Admitting: Psychiatry

## 2018-10-16 ENCOUNTER — Encounter (HOSPITAL_COMMUNITY): Payer: Self-pay | Admitting: Psychiatry

## 2018-10-16 ENCOUNTER — Other Ambulatory Visit: Payer: Self-pay

## 2018-10-16 VITALS — BP 134/85 | HR 77 | Temp 98.0°F | Ht 65.0 in | Wt 328.0 lb

## 2018-10-16 DIAGNOSIS — F332 Major depressive disorder, recurrent severe without psychotic features: Secondary | ICD-10-CM | POA: Diagnosis not present

## 2018-10-16 DIAGNOSIS — F411 Generalized anxiety disorder: Secondary | ICD-10-CM

## 2018-10-16 DIAGNOSIS — F603 Borderline personality disorder: Secondary | ICD-10-CM

## 2018-10-16 DIAGNOSIS — Z818 Family history of other mental and behavioral disorders: Secondary | ICD-10-CM

## 2018-10-16 DIAGNOSIS — R69 Illness, unspecified: Secondary | ICD-10-CM | POA: Diagnosis not present

## 2018-10-16 DIAGNOSIS — F3342 Major depressive disorder, recurrent, in full remission: Secondary | ICD-10-CM | POA: Diagnosis not present

## 2018-10-16 MED ORDER — BUSPIRONE HCL 15 MG PO TABS: 15.0000 mg | ORAL_TABLET | Freq: Four times a day (QID) | ORAL | 1 refills | Status: DC

## 2018-10-16 MED ORDER — VENLAFAXINE HCL 100 MG PO TABS: 100.0000 mg | ORAL_TABLET | Freq: Three times a day (TID) | ORAL | 4 refills | Status: DC

## 2018-10-16 MED ORDER — DESIPRAMINE HCL 25 MG PO TABS: ORAL_TABLET | ORAL | 0 refills | Status: DC

## 2018-10-16 MED ORDER — LAMOTRIGINE 150 MG PO TABS: 150.0000 mg | ORAL_TABLET | Freq: Two times a day (BID) | ORAL | 1 refills | Status: DC

## 2018-10-16 NOTE — Progress Notes (Signed)
`  Psychiatric Initial Adult Assessment   Patient Identification: Jamie Bennett MRN:  [id number] Date of Evaluation:  10/16/2018 Referral Source: grams per previous psychiatrist Chief Complaint:   Visit Diagnosis: borderline personality disorder   ICD-10-CM   1. Anxiety state F41.1 busPIRone (BUSPAR) 15 MG tablet    venlafaxine (EFFEXOR) 100 MG tablet  2. Borderline personality disorder (HCC) F60.3 lamoTRIgine (LAMICTAL) 150 MG tablet    venlafaxine (EFFEXOR) 100 MG tablet  3. Major depressive disorder, recurrent, severe without psychotic features (HCC) F33.2 lamoTRIgine (LAMICTAL) 150 MG tablet    venlafaxine (EFFEXOR) 100 MG tablet    History of Present Illness:  Today the patient is seen with her husband Jamie Bennett.  Patient is seen here for major depression.  She takes both Effexor at relatively high dose and desipramine.  The patient depression seems to be fairly well controlled.  She has mild anxiety.  Apparently got higher and she actually states took some of her own husband's BuSpar.  This is in addition to her high dose of BuSpar taking 60 mg a day for me.  Today we found out that she is also getting a small dose of Klonopin from her primary care doctor.  This patient is being treated for borderline personality disorder.  Her moods are relatively stable taking Lamictal.  She describes an increase in anxiety mainly related to the virus that is around.  Also related to her father who is in a nursing home with Parkinson's.  He resides in [place].  Patient is unable to get to them.  She fears she will never see them again.  This is because of the travel band and also because he continues to decline.  I think she is anxious about her father.  The patient is actually eating and sleeping well.  She is morbidly obese.  In our last note we had said to give her the number to Dr. Leafy Ro but she did not have it.  Today we will do it again.  The patient is able to think and concentrate without a  problem.  She is not suicidal.  She enjoys reading watching TV and does a lot of crafts.  She denies being worthless.  She denies use of alcohol or drugs.  She denies chest pain or shortness of breath at this time.  She is functioning actually fairly well.  Patient sees a therapist regularly for DVT care. Associated Signs/Symptoms: Depression Symptoms:  fatigue, (Hypo) Manic Symptoms:   Anxiety Symptoms:   Psychotic Symptoms:   PTSD Symptoms:   Past Psychiatric History: 10 psychiatric hospitalizations multiple psychotropic medications presently in psychotherapy  Previous Psychotropic Medications: Yes   Substance Abuse History in the last 12 months:  Yes.    Consequences of Substance Abuse:   Past Medical History:  Past Medical History:  Diagnosis Date  . Anemia   . Anxiety   . Bipolar 1 disorder ([place])   . Collagen vascular disease ([place])    rhematoid arthritis  . Constipation   . Fibromyalgia   . GERD (gastroesophageal reflux disease)   . Heart murmur    mild-asymptomatic  . Hepatic steatosis   . History of Roux-en-Y gastric bypass   . Hypotension   . Hypothyroidism   . Opioid abuse ([place])   . Osteoarthritis   . Osteoporosis   . PONV (postoperative nausea and vomiting)    nausea only  . Rheumatic fever   . Rheumatoid arthritis ([place])   . Thyroid disease  Past Surgical History:  Procedure Laterality Date  . CHOLECYSTECTOMY  2004  . COLONOSCOPY N/A 07/16/2017   Procedure: COLONOSCOPY;  Surgeon: Manya Silvas, MD;  Location: [facility] ENDOSCOPY;  Service: Endoscopy;  Laterality: N/A;  . EXPLORATORY LAPAROTOMY  2009  . GASTRIC BYPASS  2003   [place] N/A 03/01/2018   Procedure: EXPLORATORY LAPAROTOMY/ UMBILECTOMY;  Surgeon: Robert Bellow, MD;  Location: [facility] ORS;  Service: General;  Laterality: N/A;  . TONSILLECTOMY    . VENTRAL HERNIA REPAIR N/A 03/07/2018   Procedure: HERNIA REPAIR VENTRAL ADULT;  Surgeon: Robert Bellow, MD;  Location:  [facility] ORS;  Service: General;  Laterality: N/A;    Family Psychiatric History:   Family History:  Family History  Problem Relation Age of Onset  . Depression Mother   . Dementia Mother   . Heart disease Mother   . Parkinson's disease Father   . Colon cancer Neg Hx     Social History:   Social History   Socioeconomic History  . Marital status: Married    Spouse name: Not on file  . Number of children: Not on file  . Years of education: Not on file  . Highest education level: Not on file  Occupational History  . Not on file  Social Needs  . Financial resource strain: Not on file  . Food insecurity:    Worry: Not on file    Inability: Not on file  . Transportation needs:    Medical: Not on file    Non-medical: Not on file  Tobacco Use  . Smoking status: Never Smoker  . Smokeless tobacco: Never Used  Substance and Sexual Activity  . Alcohol use: No    Alcohol/week: 0.0 standard drinks  . Drug use: No  . Sexual activity: Never  Lifestyle  . Physical activity:    Days per week: Not on file    Minutes per session: Not on file  . Stress: Not on file  Relationships  . Social connections:    Talks on phone: Not on file    Gets together: Not on file    Attends religious service: Not on file    Active member of club or organization: Not on file    Attends meetings of clubs or organizations: Not on file    Relationship status: Not on file  Other Topics Concern  . Not on file  Social History Narrative  . Not on file    Additional Social History:   Allergies:   Allergies  Allergen Reactions  . Lactose Intolerance (Gi) Other (See Comments)    Bloating and GI distress  . Sulfa Antibiotics Hives    Metabolic Disorder Labs: No results found for: HGBA1C, MPG No results found for: PROLACTIN Lab Results  Component Value Date   TRIG 176 (H) 11/09/2015     Current Medications: Current Outpatient Medications  Medication Sig Dispense Refill  . aspirin EC 81 MG  tablet Take 81 mg by mouth daily.    . busPIRone (BUSPAR) 15 MG tablet Take 1 tablet (15 mg total) by mouth 4 (four) times daily. 360 tablet 1  . Cholecalciferol (HM VITAMIN D3) 4000 units CAPS Take 4,000 Units by mouth daily.     . clonazePAM (KLONOPIN) 0.5 MG tablet Take one tablet by mouth for acute anxiety, EMERGENCIES only - do not exceed 1 tab daily 30 tablet 0  . Coenzyme Q-10 200 MG CAPS Take 200 mg by mouth 2 (two) times daily.     Marland Kitchen  cyanocobalamin (,VITAMIN B-12,) 1000 MCG/ML injection Inject 1,000 mcg into the muscle every 30 (thirty) days.    Marland Kitchen desipramine (NORPRAMIN) 25 MG tablet 1  qam 90 tablet 0  . esomeprazole (NEXIUM) 20 MG capsule Take 20 mg by mouth 2 (two) times daily before a meal.    . Fiber, Guar Gum, CHEW Chew 5 mg by mouth daily.     . hydroxychloroquine (PLAQUENIL) 200 MG tablet Take one tablet by mouth twice a day Monday- Friday 120 tablet 0  . hydrOXYzine (ATARAX/VISTARIL) 50 MG tablet Take 1 tablet (50 mg total) by mouth 3 (three) times daily as needed for anxiety. 270 tablet 1  . ibuprofen (ADVIL,MOTRIN) 100 MG tablet Take 100 mg by mouth as needed for fever.    Javier Docker Oil 500 MG CAPS Take 500 mg by mouth daily.    Marland Kitchen lamoTRIgine (LAMICTAL) 150 MG tablet Take 1 tablet (150 mg total) by mouth 2 (two) times daily. 180 tablet 1  . levothyroxine (SYNTHROID, LEVOTHROID) 112 MCG tablet Take 1 tablet (112 mcg total) by mouth daily. 90 tablet 1  . loratadine (CLARITIN) 10 MG tablet Take 10 mg by mouth daily as needed for allergies.    . polyethylene glycol (MIRALAX) packet Take 17 g by mouth daily. 14 each 0  . sucralfate (CARAFATE) 1 g tablet Take 1 tablet (1 g total) by mouth 2 (two) times daily. 180 tablet 3  . tiZANidine (ZANAFLEX) 4 MG tablet Take 1 tablet (4 mg total) by mouth 3 (three) times daily as needed. 90 tablet 1  . traZODone (DESYREL) 100 MG tablet Take 1 tablet (100 mg total) by mouth at bedtime as needed and may repeat dose one time if needed for sleep. 60  tablet 5  . venlafaxine (EFFEXOR) 100 MG tablet Take 1 tablet (100 mg total) by mouth 3 (three) times daily with meals. 90 tablet 4  . zoledronic acid (RECLAST) 5 MG/100ML SOLN injection Inject 5 mg into the vein See admin instructions. Patient takes yearly     No current facility-administered medications for this visit.     Neurologic: Headache: No Seizure: No Paresthesias:NA  Musculoskeletal: Strength & Muscle Tone: within normal limits Gait & Station: normal Patient leans: N/A  Psychiatric Specialty Exam: ROS  Blood pressure 134/85, pulse 77, temperature 98 F (36.7 C), height 5\' 5"  (1.651 m), weight (!) 328 lb (148.8 kg).Body mass index is 54.58 kg/m.  General Appearance: Bizarre  Eye Contact:  Good  Speech:  Normal Rate  Volume:  Normal  Mood:  Anxious  Affect:  Congruent  Thought Process:  Goal Directed  Orientation:  NA  Thought Content:  Logical  Suicidal Thoughts:  No  Homicidal Thoughts:  No  Memory:  Negative  Judgement:  Fair  Insight:  NA and Good  Psychomotor Activity:  Normal  Concentration:    Recall:  Good  Fund of Knowledge:  Language: Good  Akathisia:  No  Handed:  Right  AIMS (if indicated):    Assets:  Desire for Improvement  ADL's:  Intact  Cognition: WNL  Sleep:      Treatment Plan Summary: This patient is #1 problem is that of borderline personality disorder.  This is related to the fact that she misuses substances.  She took her husband's BuSpar.  He will be watching closely.  She also has mood instability related to her borderline personality disorder.  Lamictal does seem to help this fairly well.  Her second problem is that of clinical  depression.  She takes a low-dose of desipramine at a high dose of Effexor.  She takes this on a regular basis.  She is also in psychotherapy.  This patient is not suicidal.  She is functioning fairly well.  Today she seen with her husband Jamie Bennett who is very supportive.  The patient will return to see me in  4 months.  The patient is instructed to take her medicines just as prescribed and not to take any of her husband's medicine.  Jerral Ralph, MD 3/18/20202:47 PM

## 2018-10-17 ENCOUNTER — Inpatient Hospital Stay: Payer: Medicare HMO

## 2018-10-17 ENCOUNTER — Inpatient Hospital Stay: Payer: Medicare HMO | Admitting: Oncology

## 2018-10-17 ENCOUNTER — Other Ambulatory Visit (HOSPITAL_COMMUNITY): Payer: Self-pay

## 2018-10-17 DIAGNOSIS — F411 Generalized anxiety disorder: Secondary | ICD-10-CM

## 2018-10-17 MED ORDER — CLONAZEPAM 0.5 MG PO TABS: ORAL_TABLET | ORAL | 0 refills | Status: DC

## 2018-10-29 DIAGNOSIS — F4010 Social phobia, unspecified: Secondary | ICD-10-CM | POA: Diagnosis not present

## 2018-10-29 DIAGNOSIS — F603 Borderline personality disorder: Secondary | ICD-10-CM | POA: Diagnosis not present

## 2018-10-29 DIAGNOSIS — R69 Illness, unspecified: Secondary | ICD-10-CM | POA: Diagnosis not present

## 2018-10-31 ENCOUNTER — Other Ambulatory Visit: Payer: Self-pay

## 2018-10-31 ENCOUNTER — Other Ambulatory Visit: Payer: Self-pay | Admitting: *Deleted

## 2018-10-31 DIAGNOSIS — M0579 Rheumatoid arthritis with rheumatoid factor of multiple sites without organ or systems involvement: Secondary | ICD-10-CM

## 2018-10-31 MED ORDER — TIZANIDINE HCL 4 MG PO TABS: 4.0000 mg | ORAL_TABLET | Freq: Three times a day (TID) | ORAL | 0 refills | Status: DC | PRN

## 2018-10-31 MED ORDER — HYDROXYCHLOROQUINE SULFATE 200 MG PO TABS: ORAL_TABLET | ORAL | 0 refills | Status: DC

## 2018-10-31 NOTE — Telephone Encounter (Signed)
Last visit: 09/09/18 Next visit: 01/16/19 Labs: 08/20/18 Creatinine elevated and GFR is low Rest of CMP WNL. CBC WNL  PLQ Eye Exam: 06/24/18 WNL   Okay to refill per Dr. Estanislado Pandy

## 2018-10-31 NOTE — Telephone Encounter (Signed)
Pt advised keeep follow up appt and we send 1 month med to her phar

## 2018-11-11 ENCOUNTER — Other Ambulatory Visit: Payer: Self-pay

## 2018-11-11 MED ORDER — LEVOTHYROXINE SODIUM 112 MCG PO TABS: 112.0000 ug | ORAL_TABLET | Freq: Every day | ORAL | 0 refills | Status: DC

## 2018-11-12 DIAGNOSIS — R69 Illness, unspecified: Secondary | ICD-10-CM | POA: Diagnosis not present

## 2018-11-12 DIAGNOSIS — F603 Borderline personality disorder: Secondary | ICD-10-CM | POA: Diagnosis not present

## 2018-11-12 DIAGNOSIS — F4010 Social phobia, unspecified: Secondary | ICD-10-CM | POA: Diagnosis not present

## 2018-11-14 ENCOUNTER — Other Ambulatory Visit: Payer: Medicare HMO

## 2018-11-14 ENCOUNTER — Ambulatory Visit: Payer: Medicare HMO | Admitting: Oncology

## 2018-11-14 ENCOUNTER — Ambulatory Visit: Payer: Medicare HMO

## 2018-11-14 ENCOUNTER — Other Ambulatory Visit: Payer: Self-pay

## 2018-11-14 MED ORDER — LEVOTHYROXINE SODIUM 112 MCG PO TABS: 112.0000 ug | ORAL_TABLET | Freq: Every day | ORAL | 0 refills | Status: DC

## 2018-11-25 ENCOUNTER — Other Ambulatory Visit (HOSPITAL_COMMUNITY): Payer: Self-pay

## 2018-11-25 DIAGNOSIS — F411 Generalized anxiety disorder: Secondary | ICD-10-CM

## 2018-11-25 MED ORDER — CLONAZEPAM 0.5 MG PO TABS: ORAL_TABLET | ORAL | 3 refills | Status: DC

## 2018-11-26 ENCOUNTER — Ambulatory Visit (INDEPENDENT_AMBULATORY_CARE_PROVIDER_SITE_OTHER): Payer: Medicare HMO | Admitting: Nurse Practitioner

## 2018-11-26 ENCOUNTER — Other Ambulatory Visit: Payer: Self-pay

## 2018-11-26 ENCOUNTER — Encounter: Payer: Self-pay | Admitting: Nurse Practitioner

## 2018-11-26 VITALS — Ht 65.0 in | Wt 310.0 lb

## 2018-11-26 DIAGNOSIS — E039 Hypothyroidism, unspecified: Secondary | ICD-10-CM | POA: Diagnosis not present

## 2018-11-26 DIAGNOSIS — F3160 Bipolar disorder, current episode mixed, unspecified: Secondary | ICD-10-CM | POA: Diagnosis not present

## 2018-11-26 DIAGNOSIS — J45990 Exercise induced bronchospasm: Secondary | ICD-10-CM

## 2018-11-26 DIAGNOSIS — R69 Illness, unspecified: Secondary | ICD-10-CM | POA: Diagnosis not present

## 2018-11-26 DIAGNOSIS — F4010 Social phobia, unspecified: Secondary | ICD-10-CM | POA: Diagnosis not present

## 2018-11-26 DIAGNOSIS — F603 Borderline personality disorder: Secondary | ICD-10-CM | POA: Diagnosis not present

## 2018-11-26 MED ORDER — ALBUTEROL SULFATE HFA 108 (90 BASE) MCG/ACT IN AERS: 2.0000 | INHALATION_SPRAY | Freq: Four times a day (QID) | RESPIRATORY_TRACT | 3 refills | Status: DC | PRN

## 2018-11-26 NOTE — Progress Notes (Signed)
[facility] [place], [place] [address]  Internal MEDICINE  Telephone Visit  Patient Name: Jamie Bennett  [id number]  [id number]  Date of Service: 12/15/2018  I connected with the patient at 4:24pm by webcam and verified the patients identity using two identifiers.   I discussed the limitations, risks, security and privacy concerns of performing an evaluation and management service by webcam and the availability of in person appointments. I also discussed with the patient that there may be a patient responsible charge related to the service.  The patient expressed understanding and agrees to proceed.    Chief Complaint  Patient presents with  . Telephone Screen    VIDEO VISIT [phone]  . Telephone Assessment  . Medical Management of Chronic Issues    6 month follow up, medication refills, pt is havin breathing issues wanting to know if she could possiby get an inhaler    The patient has been contacted via telephone for follow up visit due to concerns for spread of novel coronavirus. The patient states she is doing well. Does have some intermittent wheezing related to presence of pollen in the air. She would like to have a refill of her albuterol inhaler. She states she is otherwise doing well.       Current Medication: Outpatient Encounter Medications as of 11/26/2018  Medication Sig  . aspirin EC 81 MG tablet Take 81 mg by mouth daily.  . busPIRone (BUSPAR) 15 MG tablet Take 1 tablet (15 mg total) by mouth 4 (four) times daily. (Patient not taking: Reported on 12/09/2018)  . Cholecalciferol (HM VITAMIN D3) 4000 units CAPS Take 4,000 Units by mouth daily.   . clonazePAM (KLONOPIN) 0.5 MG tablet Take one tablet by mouth for acute anxiety, EMERGENCIES only - do not exceed 1 tab daily  . Coenzyme Q-10 200 MG CAPS Take 200 mg by mouth 2 (two) times daily.   . cyanocobalamin (,VITAMIN B-12,) 1000 MCG/ML injection Inject 1,000 mcg into the muscle every 30  (thirty) days.  Marland Kitchen desipramine (NORPRAMIN) 25 MG tablet 1  qam  . esomeprazole (NEXIUM) 20 MG capsule Take 20 mg by mouth 2 (two) times daily before a meal.  . Fiber, Guar Gum, CHEW Chew 5 mg by mouth daily.   . hydroxychloroquine (PLAQUENIL) 200 MG tablet Take one tablet by mouth twice a day Monday- Friday  . hydrOXYzine (ATARAX/VISTARIL) 50 MG tablet Take 1 tablet (50 mg total) by mouth 3 (three) times daily as needed for anxiety.  Marland Kitchen ibuprofen (ADVIL,MOTRIN) 100 MG tablet Take 100 mg by mouth as needed for fever.  Javier Docker Oil 500 MG CAPS Take 500 mg by mouth daily.  Marland Kitchen lamoTRIgine (LAMICTAL) 150 MG tablet Take 1 tablet (150 mg total) by mouth 2 (two) times daily.  Marland Kitchen levothyroxine (SYNTHROID) 112 MCG tablet Take 1 tablet (112 mcg total) by mouth daily.  Marland Kitchen loratadine (CLARITIN) 10 MG tablet Take 10 mg by mouth daily as needed for allergies.  . polyethylene glycol (MIRALAX) packet Take 17 g by mouth daily.  . sucralfate (CARAFATE) 1 g tablet Take 1 tablet (1 g total) by mouth 2 (two) times daily.  Marland Kitchen tiZANidine (ZANAFLEX) 4 MG tablet Take 1 tablet (4 mg total) by mouth 3 (three) times daily as needed.  . traZODone (DESYREL) 100 MG tablet Take 1 tablet (100 mg total) by mouth at bedtime as needed and may repeat dose one time if needed for sleep.  Marland Kitchen venlafaxine (EFFEXOR) 100 MG tablet Take  1 tablet (100 mg total) by mouth 3 (three) times daily with meals.  . zoledronic acid (RECLAST) 5 MG/100ML SOLN injection Inject 5 mg into the vein See admin instructions. Patient takes yearly  . albuterol (VENTOLIN HFA) 108 (90 Base) MCG/ACT inhaler Inhale 2 puffs into the lungs every 6 (six) hours as needed for wheezing or shortness of breath.   No facility-administered encounter medications on file as of 11/26/2018.     Surgical History: Past Surgical History:  Procedure Laterality Date  . CHOLECYSTECTOMY  2004  . COLONOSCOPY N/A 07/16/2017   Procedure: COLONOSCOPY;  Surgeon: Manya Silvas, MD;   Location: [facility] ENDOSCOPY;  Service: Endoscopy;  Laterality: N/A;  . EXPLORATORY LAPAROTOMY  2009  . GASTRIC BYPASS  2003   [place] N/A 03/01/2018   Procedure: EXPLORATORY LAPAROTOMY/ UMBILECTOMY;  Surgeon: Robert Bellow, MD;  Location: [facility] ORS;  Service: General;  Laterality: N/A;  . TONSILLECTOMY    . VENTRAL HERNIA REPAIR N/A 03/07/2018   Procedure: HERNIA REPAIR VENTRAL ADULT;  Surgeon: Robert Bellow, MD;  Location: [facility] ORS;  Service: General;  Laterality: N/A;    Medical History: Past Medical History:  Diagnosis Date  . Anemia   . Anxiety   . Bipolar 1 disorder ([place])   . Collagen vascular disease ([place])    rhematoid arthritis  . Constipation   . Fibromyalgia   . GERD (gastroesophageal reflux disease)   . Heart murmur    mild-asymptomatic  . Hepatic steatosis   . History of Roux-en-Y gastric bypass   . Hypotension   . Hypothyroidism   . Opioid abuse ([place])   . Osteoarthritis   . Osteoporosis   . PONV (postoperative nausea and vomiting)    nausea only  . Rheumatic fever   . Rheumatoid arthritis ([place])   . Thyroid disease     Family History: Family History  Problem Relation Age of Onset  . Depression Mother   . Dementia Mother   . Heart disease Mother   . Parkinson's disease Father   . Colon cancer Neg Hx     Social History   Socioeconomic History  . Marital status: Married    Spouse name: Not on file  . Number of children: Not on file  . Years of education: Not on file  . Highest education level: Not on file  Occupational History  . Not on file  Social Needs  . Financial resource strain: Not on file  . Food insecurity:    Worry: Not on file    Inability: Not on file  . Transportation needs:    Medical: Not on file    Non-medical: Not on file  Tobacco Use  . Smoking status: Never Smoker  . Smokeless tobacco: Never Used  Substance and Sexual Activity  . Alcohol use: No    Alcohol/week: 0.0 standard drinks  . Drug use:  No  . Sexual activity: Never  Lifestyle  . Physical activity:    Days per week: Not on file    Minutes per session: Not on file  . Stress: Not on file  Relationships  . Social connections:    Talks on phone: Not on file    Gets together: Not on file    Attends religious service: Not on file    Active member of club or organization: Not on file    Attends meetings of clubs or organizations: Not on file    Relationship status: Not on file  .  Intimate partner violence:    Fear of current or ex partner: Not on file    Emotionally abused: Not on file    Physically abused: Not on file    Forced sexual activity: Not on file  Other Topics Concern  . Not on file  Social History Narrative  . Not on file      Review of Systems  Constitutional: Positive for fatigue. Negative for chills, fever and unexpected weight change.  HENT: Negative for congestion, postnasal drip, rhinorrhea, sneezing and sore throat.   Respiratory: Positive for shortness of breath and wheezing. Negative for cough and chest tightness.   Cardiovascular: Negative for chest pain and palpitations.  Gastrointestinal: Negative for abdominal pain, constipation, diarrhea, nausea and vomiting.  Endocrine:       Well managed hypothyroid  Musculoskeletal: Positive for arthralgias and myalgias. Negative for back pain, joint swelling and neck pain.  Skin: Negative for rash.  Neurological: Negative for dizziness, tremors, numbness and headaches.  Hematological: Negative for adenopathy. Does not bruise/bleed easily.  Psychiatric/Behavioral: Positive for dysphoric mood. Negative for behavioral problems (Depression), sleep disturbance and suicidal ideas. The patient is nervous/anxious.        Routinely sees psychiatry    Today's Vitals   11/26/18 1602  Weight: (!) 310 lb (140.6 kg)  Height: 5\' 5"  (1.651 m)   Body mass index is 51.59 kg/m.  Observation/Objective:   The patient is alert and oriented. She is pleasant and  answers all questions appropriately. Breathing is non-labored. She is in no acute distress at this time.    Assessment/Plan: 1. Acquired hypothyroidism Stable. Check thyroid panel and adjust levothyroxine dose as indicated   2. Exercise-induced asthma Refill albuterol inhaler. May use as needed and as prescribed  - albuterol (VENTOLIN HFA) 108 (90 Base) MCG/ACT inhaler; Inhale 2 puffs into the lungs every 6 (six) hours as needed for wheezing or shortness of breath.  Dispense: 1 Inhaler; Refill: 3  3. Bipolar affective disorder, current episode mixed, current episode severity unspecified ([place]) Continue to regularly see psychiatry.  General Counseling: ailyne pawley understanding of the findings of today's phone visit and agrees with plan of treatment. I have discussed any further diagnostic evaluation that may be needed or ordered today. We also reviewed her medications today. she has been encouraged to call the office with any questions or concerns that should arise related to todays visit.  This patient was seen by [place] with Dr Lavera Guise as a part of collaborative care agreement  Meds ordered this encounter  Medications  . albuterol (VENTOLIN HFA) 108 (90 Base) MCG/ACT inhaler    Sig: Inhale 2 puffs into the lungs every 6 (six) hours as needed for wheezing or shortness of breath.    Dispense:  1 Inhaler    Refill:  3    Order Specific Question:   Supervising Provider    Answer:   Lavera Guise [[id number]]    Time spent: 63 Minutes    Dr Lavera Guise Internal medicine

## 2018-12-06 NOTE — Progress Notes (Signed)
Office Visit Note  Patient: Jamie Bennett             Date of Birth: Apr 16, 1961           MRN: [id number]             PCP: Ronnell Freshwater, NP Referring: Ronnell Freshwater, NP Visit Date: 12/09/2018 Occupation: @GUAROCC @  Subjective:  Pain in both knee joints   History of Present Illness: Jamie Bennett is a 58 y.o. female with history of seropositive rheumatoid arthritis and osteoarthritis.  She is taking PLQ 200 mg BID M-F.  She has not missed any doses recently.  She has not had any recent rheumatoid arthritis flares.  She states that about 2 weeks ago she was working in the yard and fell on her left knee joint.  Since the fall she is noticed a clicking in the left knee joint.  She is also been experiencing some pain at night.  She states that she has been having increased pain in bilateral knee joints.  She denies any joint swelling.  She is been trying to walk 1-1/2 miles per day.  She continues to have aching and stiffness in bilateral hands.  She has been having increased tenderness in the right third PIP joint.  She continues to have neck and lower back pain.  She states that over the past 3 months she has had 6 fibromyalgia flares.  She attributes the more frequent and severe flares due to increased stress surrounding the coronavirus.    Activities of Daily Living:  Patient reports morning stiffness for 1 hour.   Patient Reports nocturnal pain.  Difficulty dressing/grooming: Denies Difficulty climbing stairs: Reports Difficulty getting out of chair: Reports Difficulty using hands for taps, buttons, cutlery, and/or writing: Denies  Review of Systems  Constitutional: Positive for fatigue.  HENT: Negative for mouth sores, mouth dryness and nose dryness.   Eyes: Negative for pain, itching and dryness.  Respiratory: Negative for shortness of breath, wheezing and difficulty breathing.   Cardiovascular: Negative for chest pain, palpitations and swelling in legs/feet.    Gastrointestinal: Negative for abdominal pain, constipation and diarrhea.  Endocrine: Negative for increased urination.  Genitourinary: Negative for painful urination.  Musculoskeletal: Positive for arthralgias, joint pain, joint swelling and morning stiffness.  Skin: Negative for rash and redness.  Allergic/Immunologic: Negative for susceptible to infections.  Neurological: Positive for headaches and weakness. Negative for dizziness and memory loss.  Hematological: Positive for bruising/bleeding tendency.  Psychiatric/Behavioral: Negative for confusion. The patient is not nervous/anxious.     PMFS History:  Patient Active Problem List   Diagnosis Date Noted   Screening for breast cancer 05/29/2018   Duodenal ulcer disease 05/29/2018   Screening for malignant neoplasm of cervix 05/29/2018   Dysuria 05/29/2018   Surgical wound dehiscence, initial encounter 04/14/2018   Postoperative abdominal hernia with obstruction    Incarcerated ventral hernia 03/22/2018   SBO (small bowel obstruction) ([place]) 03/07/2018   Abscess of abdominal wall 03/01/2018   Persistent umbilical sinus 32/67/1245   Tinea pedis of both feet 01/23/2018   Atopic dermatitis 12/05/2017   Adjustment disorder with mixed anxiety and depressed mood 03/10/2017   Major depressive disorder 03/10/2017   Primary osteoarthritis of both knees 01/19/2017   Suicide attempt ([place]) 07/10/2016   Fibromyalgia 07/10/2016   High risk medication use 07/10/2016   AKI (acute kidney injury) ([place]) 11/09/2015   Elevated troponin 11/09/2015   Hypotension 11/09/2015   Respiratory failure ([place])  Acute hepatic failure 11/08/2015   Drug overdose 11/08/2015   Fatty infiltration of liver 02/16/2015   Hepatic fibrosis 02/16/2015   Abnormal serum level of alkaline phosphatase 02/15/2015   Iron deficiency anemia 02/05/2015   Vitamin B 12 deficiency 02/05/2015   OP (osteoporosis) 06/16/2014   Rheumatoid  arteritis ([place]) 03/02/2014   Hypothyroidism 03/02/2014   Rheumatic fever without heart involvement 03/02/2014   Adult hypothyroidism 03/02/2014   Arthritis of pelvic region, degenerative 03/02/2014   Bipolar 1 disorder, depressed ([place]) 02/26/2014   Bariatric surgery status 11/24/2013   Affective bipolar disorder ([place]) 11/24/2013   Bipolar affective disorder ([place]) 11/24/2013   Rheumatoid arthritis ([place]) 11/24/2013   Polysubstance (excluding opioids) dependence ([place]) 09/11/2013   Polysubstance dependence ([place]) 09/11/2013   Combined drug dependence excluding opioids ([place]) 09/11/2013   Arthritis or polyarthritis, rheumatoid ([place]) 09/05/2013   Leg weakness 09/05/2013    Past Medical History:  Diagnosis Date   Anemia    Anxiety    Bipolar 1 disorder (HCC)    Collagen vascular disease (HCC)    rhematoid arthritis   Constipation    Fibromyalgia    GERD (gastroesophageal reflux disease)    Heart murmur    mild-asymptomatic   Hepatic steatosis    History of Roux-en-Y gastric bypass    Hypotension    Hypothyroidism    Opioid abuse (HCC)    Osteoarthritis    Osteoporosis    PONV (postoperative nausea and vomiting)    nausea only   Rheumatic fever    Rheumatoid arthritis ([place])    Thyroid disease     Family History  Problem Relation Age of Onset   Depression Mother    Dementia Mother    Heart disease Mother    Parkinson's disease Father    Colon cancer Neg Hx    Past Surgical History:  Procedure Laterality Date   CHOLECYSTECTOMY  2004   COLONOSCOPY N/A 07/16/2017   Procedure: COLONOSCOPY;  Surgeon: Manya Silvas, MD;  [facility] END[facility]COPY;  Service: Endoscopy;  Laterality: N/A;   EXPLORATORY LAPAROTOMY  2009   GASTRIC BYPASS  2003   [place] N/A 03/01/2018   Procedure: EXPLORATORY LAPAROTOMY/ UMBILECTOMY;  Surgeon: Robert Bellow, MD;  Location: [facility] ORS;  Service: General;  Laterality: N/A;    TONSILLECTOMY     VENTRAL HERNIA REPAIR N/A 03/07/2018   Procedure: HERNIA REPAIR VENTRAL ADULT;  Surgeon: Robert Bellow, MD;  Location: [facility] ORS;  Service: General;  Laterality: N/A;   Social History   Social History Narrative   Not on file   Immunization History  Administered Date(s) Administered   Influenza,inj,Quad PF,6+ Mos 04/24/2016   Pneumococcal Polysaccharide-23 03/02/2018     Objective: Vital Signs: BP 129/85 (BP Location: Right Wrist, Patient Position: Sitting, Cuff Size: Normal)    Pulse 73    Resp 16    Ht 5\' 5"  (1.651 m)    Wt (!) 335 lb (152 kg)    BMI 55.75 kg/m    Physical Exam Vitals signs and nursing note reviewed.  Constitutional:      Appearance: She is well-developed.  HENT:     Head: Normocephalic and atraumatic.  Eyes:     Conjunctiva/sclera: Conjunctivae normal.  Neck:     Musculoskeletal: Normal range of motion.  Cardiovascular:     Rate and Rhythm: Normal rate and regular rhythm.     Heart sounds: Normal heart sounds.  Pulmonary:     Effort: Pulmonary effort is normal.  Breath sounds: Normal breath sounds.  Abdominal:     General: Bowel sounds are normal.     Palpations: Abdomen is soft.  Lymphadenopathy:     Cervical: No cervical adenopathy.  Skin:    General: Skin is warm and dry.     Capillary Refill: Capillary refill takes less than 2 seconds.  Neurological:     Mental Status: She is alert and oriented to person, place, and time.  Psychiatric:        Behavior: Behavior normal.      Musculoskeletal Exam: C-spine slightly limited range of motion without rotation.  Thoracic kyphosis noted.  Shoulder joints, elbow joints, wrist joints, MTPs, PIPs, DIPs good range of motion no synovitis.  She has PIP and DIP synovial thickening consistent with osteoarthritis of bilateral hands.  Hip joints good range of motion no discomfort.  She has left knee warmth and crepitus on exam.  No tenderness or swelling of ankle joints.  No tenderness  over trochanteric bursa bilaterally.  CDAI Exam: CDAI Score: Not documented Patient Global Assessment: Not documented; Provider Global Assessment: Not documented Swollen: Not documented; Tender: Not documented Joint Exam   Not documented   There is currently no information documented on the homunculus. Go to the Rheumatology activity and complete the homunculus joint exam.  Investigation: No additional findings.  Imaging: No results found.  Recent Labs: Lab Results  Component Value Date   WBC 5.2 07/18/2018   HGB 12.4 07/18/2018   PLT 195 07/18/2018   NA 140 08/20/2018   K 4.5 08/20/2018   CL 110 08/20/2018   CO2 20 08/20/2018   GLUCOSE 89 08/20/2018   BUN 15 08/20/2018   CREATININE 1.15 (H) 08/20/2018   BILITOT 0.3 08/20/2018   ALKPHOS 218 (H) 03/07/2018   AST 19 08/20/2018   ALT 13 08/20/2018   PROT 6.6 08/20/2018   ALBUMIN 3.9 03/07/2018   CALCIUM 9.1 08/20/2018   GFRAA 61 08/20/2018    Speciality Comments:  PLQ Eye Exam: 06/24/18 WNL @ [facility] follow up in 1 year  Procedures:  Large Joint Inj: bilateral knee on 12/09/2018 11:28 AM Indications: pain Details: 27 G 1.5 in needle, medial approach  Arthrogram: No  Medications (Right): 1.5 mL lidocaine 1 %; 40 mg triamcinolone acetonide 40 MG/ML Aspirate (Right): 0 mL Medications (Left): 1.5 mL lidocaine 1 %; 40 mg triamcinolone acetonide 40 MG/ML Aspirate (Left): 0 mL Outcome: tolerated well, no immediate complications Procedure, treatment alternatives, risks and benefits explained, specific risks discussed. Consent was given by the patient. Immediately prior to procedure a time out was called to verify the correct patient, procedure, equipment, support staff and site/side marked as required. Patient was prepped and draped in the usual sterile fashion.     Allergies: Lactose intolerance (gi) and Sulfa antibiotics   Assessment / Plan:     Visit Diagnoses: Rheumatoid arthritis involving multiple  sites with positive rheumatoid factor ([place]): She has not had any recent rheumatoid arthritis flares.  Her rheumatoid arthritis seems well controlled on Plaquenil 200 mg 1 tablet by mouth twice daily Monday through Friday.  She has tenderness of the right third PIP joint but no synovitis was noted.  She has been sewing facemasks recently which has been exacerbating her hand pain and stiffness.  She presents today with bilateral knee joint.  She requested bilateral knee joint cortisone injections.  She will continue on the current treatment regimen.  She does not need a refill of Plaquenil at this time.  She was advised to notify us if she develops increased joint pain or joint swelling.  She will follow-up in the office in 5 months.  High risk medication use - Plaquenil 200 mg twice daily Monday through Friday.  Last Plaquenil eye exam normal on 06/24/2018.  She is due to update CBC and CMP.  Future orders for CBC and CMP were placed today.   - Plan: COMPLETE METABOLIC PANEL WITH GFR, CBC with Differential/Platelet  Primary osteoarthritis of both knees: She has been experiencing increased pain in bilateral knee joints.  She states 2 weeks ago she fell on her left knee and has been experiencing increased discomfort and a clicking sensation since then.  She has not noticed any joint swelling or surrounding erythema.  No bruising was noted.  She has mild warmth of the left knee joint on exam.  She has been trying to walk 1/2 miles per day.  She requested bilateral knee joint cortisone injections.  She tolerated procedure well.  She was advised to monitor her blood pressure closely following the cortisone injections.  Fibromyalgia: She continues have generalized muscle aches muscle tenderness due to fibromyalgia.  Has generalized hyperalgesia and positive tender points on exam.  She is been having more frequent and severe fibromyalgia flares which she attributes to increased stress.  In the past 3 months she has  had 6 fibromyalgia flares.  She continues to have chronic fatigue related to insomnia.  She was encouraged exercise and stay active.  She has been walking 1-1/2 miles per day.  DDD (degenerative disc disease), cervical: Chronic pain.  She has limited range of motion with lateral rotation.  She has no symptoms of radiculopathy at this time.  DDD (degenerative disc disease), thoracic: She has mild thoracic kyphosis.  DDD (degenerative disc disease), lumbar: Chronic pain.  Other osteoporosis without current pathological fracture: She is taking a vitamin D supplement.   Other medical conditions are listed as follows:   History of hypothyroidism  History of gastroesophageal reflux (GERD)  History of bipolar disorder  History of suicide attempt  History of anemia   Orders: Orders Placed This Encounter  Procedures   Large Joint Inj   No orders of the defined types were placed in this encounter.   Face-to-face time spent with patient was 30 minutes. Greater than 50% of time was spent in counseling and coordination of care.  Follow-Up Instructions: Return in about 5 months (around 05/11/2019) for Rheumatoid arthritis, Osteoarthritis, Fibromyalgia.   Ofilia Neas, PA-C   I examined and evaluated the patient with Hazel Sams PA.  Patient has been experiencing pain and discomfort in the bilateral knee joints due to underlying osteoarthritis.  Per her request bilateral knee joints were injected with cortisone as described above.  The plan of care was discussed as noted above.  Bo Merino, MD  Note - This record has been created using Editor, commissioning.  Chart creation errors have been sought, but may not always  have been located. Such creation errors do not reflect on  the standard of medical care.

## 2018-12-09 ENCOUNTER — Ambulatory Visit: Payer: Medicare HMO | Admitting: Physician Assistant

## 2018-12-09 ENCOUNTER — Encounter: Payer: Self-pay | Admitting: Physician Assistant

## 2018-12-09 ENCOUNTER — Other Ambulatory Visit: Payer: Self-pay

## 2018-12-09 VITALS — BP 129/85 | HR 73 | Resp 16 | Ht 65.0 in | Wt 335.0 lb

## 2018-12-09 DIAGNOSIS — Z8719 Personal history of other diseases of the digestive system: Secondary | ICD-10-CM | POA: Diagnosis not present

## 2018-12-09 DIAGNOSIS — M5134 Other intervertebral disc degeneration, thoracic region: Secondary | ICD-10-CM | POA: Diagnosis not present

## 2018-12-09 DIAGNOSIS — Z8659 Personal history of other mental and behavioral disorders: Secondary | ICD-10-CM

## 2018-12-09 DIAGNOSIS — G8929 Other chronic pain: Secondary | ICD-10-CM

## 2018-12-09 DIAGNOSIS — M25561 Pain in right knee: Secondary | ICD-10-CM

## 2018-12-09 DIAGNOSIS — Z862 Personal history of diseases of the blood and blood-forming organs and certain disorders involving the immune mechanism: Secondary | ICD-10-CM

## 2018-12-09 DIAGNOSIS — M170 Bilateral primary osteoarthritis of knee: Secondary | ICD-10-CM | POA: Diagnosis not present

## 2018-12-09 DIAGNOSIS — M5136 Other intervertebral disc degeneration, lumbar region: Secondary | ICD-10-CM

## 2018-12-09 DIAGNOSIS — M818 Other osteoporosis without current pathological fracture: Secondary | ICD-10-CM | POA: Diagnosis not present

## 2018-12-09 DIAGNOSIS — Z79899 Other long term (current) drug therapy: Secondary | ICD-10-CM | POA: Diagnosis not present

## 2018-12-09 DIAGNOSIS — M5030 Other cervical disc degeneration, unspecified cervical region: Secondary | ICD-10-CM | POA: Diagnosis not present

## 2018-12-09 DIAGNOSIS — Z8639 Personal history of other endocrine, nutritional and metabolic disease: Secondary | ICD-10-CM

## 2018-12-09 DIAGNOSIS — R69 Illness, unspecified: Secondary | ICD-10-CM | POA: Diagnosis not present

## 2018-12-09 DIAGNOSIS — M0579 Rheumatoid arthritis with rheumatoid factor of multiple sites without organ or systems involvement: Secondary | ICD-10-CM | POA: Diagnosis not present

## 2018-12-09 DIAGNOSIS — M797 Fibromyalgia: Secondary | ICD-10-CM | POA: Diagnosis not present

## 2018-12-09 DIAGNOSIS — M25562 Pain in left knee: Secondary | ICD-10-CM

## 2018-12-09 MED ORDER — LIDOCAINE HCL 1 % IJ SOLN
1.5000 mL | INTRAMUSCULAR | Status: AC | PRN
  Administered 2018-12-09: 1.5 mL

## 2018-12-09 MED ORDER — TRIAMCINOLONE ACETONIDE 40 MG/ML IJ SUSP
40.0000 mg | INTRAMUSCULAR | Status: AC | PRN
  Administered 2018-12-09: 40 mg via INTRA_ARTICULAR

## 2018-12-10 DIAGNOSIS — R69 Illness, unspecified: Secondary | ICD-10-CM | POA: Diagnosis not present

## 2018-12-10 DIAGNOSIS — F4010 Social phobia, unspecified: Secondary | ICD-10-CM | POA: Diagnosis not present

## 2018-12-10 DIAGNOSIS — F603 Borderline personality disorder: Secondary | ICD-10-CM | POA: Diagnosis not present

## 2018-12-12 ENCOUNTER — Other Ambulatory Visit: Payer: Self-pay

## 2018-12-13 ENCOUNTER — Other Ambulatory Visit: Payer: Self-pay

## 2018-12-13 ENCOUNTER — Inpatient Hospital Stay: Payer: Medicare HMO

## 2018-12-13 ENCOUNTER — Inpatient Hospital Stay: Payer: Medicare HMO | Attending: Oncology

## 2018-12-13 DIAGNOSIS — Z79899 Other long term (current) drug therapy: Secondary | ICD-10-CM | POA: Diagnosis not present

## 2018-12-13 DIAGNOSIS — E538 Deficiency of other specified B group vitamins: Secondary | ICD-10-CM

## 2018-12-13 DIAGNOSIS — D509 Iron deficiency anemia, unspecified: Secondary | ICD-10-CM | POA: Insufficient documentation

## 2018-12-13 DIAGNOSIS — D508 Other iron deficiency anemias: Secondary | ICD-10-CM

## 2018-12-13 DIAGNOSIS — K9589 Other complications of other bariatric procedure: Secondary | ICD-10-CM

## 2018-12-13 LAB — IRON AND TIBC
Iron: 92 ug/dL (ref 28–170)
Saturation Ratios: 20 % (ref 10.4–31.8)
TIBC: 462 ug/dL — ABNORMAL HIGH (ref 250–450)
UIBC: 370 ug/dL

## 2018-12-13 LAB — CBC WITH DIFFERENTIAL/PLATELET
Abs Immature Granulocytes: 0.02 10*3/uL (ref 0.00–0.07)
Basophils Absolute: 0 10*3/uL (ref 0.0–0.1)
Basophils Relative: 1 %
Eosinophils Absolute: 0.2 10*3/uL (ref 0.0–0.5)
Eosinophils Relative: 2 %
HCT: 40.5 % (ref 36.0–46.0)
Hemoglobin: 13.5 g/dL (ref 12.0–15.0)
Immature Granulocytes: 0 %
Lymphocytes Relative: 31 %
Lymphs Abs: 2.4 10*3/uL (ref 0.7–4.0)
MCH: 30.8 pg (ref 26.0–34.0)
MCHC: 33.3 g/dL (ref 30.0–36.0)
MCV: 92.3 fL (ref 80.0–100.0)
Monocytes Absolute: 0.7 10*3/uL (ref 0.1–1.0)
Monocytes Relative: 10 %
Neutro Abs: 4.3 10*3/uL (ref 1.7–7.7)
Neutrophils Relative %: 56 %
Platelets: 249 10*3/uL (ref 150–400)
RBC: 4.39 MIL/uL (ref 3.87–5.11)
RDW: 12.8 % (ref 11.5–15.5)
WBC: 7.7 10*3/uL (ref 4.0–10.5)
nRBC: 0 % (ref 0.0–0.2)

## 2018-12-13 LAB — FERRITIN: Ferritin: 27 ng/mL (ref 11–307)

## 2018-12-13 MED ORDER — CYANOCOBALAMIN 1000 MCG/ML IJ SOLN
1000.0000 ug | Freq: Once | INTRAMUSCULAR | Status: AC
  Administered 2018-12-13: 1000 ug via INTRAMUSCULAR

## 2018-12-15 DIAGNOSIS — J45990 Exercise induced bronchospasm: Secondary | ICD-10-CM | POA: Insufficient documentation

## 2018-12-16 ENCOUNTER — Other Ambulatory Visit: Payer: Self-pay

## 2018-12-17 ENCOUNTER — Inpatient Hospital Stay: Payer: Medicare HMO

## 2018-12-17 ENCOUNTER — Inpatient Hospital Stay (HOSPITAL_BASED_OUTPATIENT_CLINIC_OR_DEPARTMENT_OTHER): Payer: Medicare HMO | Admitting: Oncology

## 2018-12-17 ENCOUNTER — Ambulatory Visit: Payer: Medicare HMO | Admitting: Rheumatology

## 2018-12-17 ENCOUNTER — Encounter: Payer: Self-pay | Admitting: Oncology

## 2018-12-17 DIAGNOSIS — K9589 Other complications of other bariatric procedure: Secondary | ICD-10-CM

## 2018-12-17 DIAGNOSIS — E538 Deficiency of other specified B group vitamins: Secondary | ICD-10-CM

## 2018-12-17 DIAGNOSIS — Z7982 Long term (current) use of aspirin: Secondary | ICD-10-CM | POA: Diagnosis not present

## 2018-12-17 DIAGNOSIS — Z79899 Other long term (current) drug therapy: Secondary | ICD-10-CM | POA: Diagnosis not present

## 2018-12-17 DIAGNOSIS — D509 Iron deficiency anemia, unspecified: Secondary | ICD-10-CM | POA: Diagnosis not present

## 2018-12-17 DIAGNOSIS — D508 Other iron deficiency anemias: Secondary | ICD-10-CM

## 2018-12-17 NOTE — Progress Notes (Signed)
Pt has not been able to get b12 shot for several months because of the pandemic. She did get one in may because she was having labs.  She feels that she does not have anemia the b12 low causes the anemia.  She is tired but she knows it is from b12 being low. No pain today

## 2018-12-19 ENCOUNTER — Inpatient Hospital Stay: Payer: Medicare HMO

## 2018-12-19 ENCOUNTER — Other Ambulatory Visit: Payer: Self-pay

## 2018-12-19 VITALS — BP 110/65 | HR 76 | Resp 18

## 2018-12-19 DIAGNOSIS — D509 Iron deficiency anemia, unspecified: Secondary | ICD-10-CM | POA: Diagnosis not present

## 2018-12-19 DIAGNOSIS — Z79899 Other long term (current) drug therapy: Secondary | ICD-10-CM | POA: Diagnosis not present

## 2018-12-19 DIAGNOSIS — E538 Deficiency of other specified B group vitamins: Secondary | ICD-10-CM | POA: Diagnosis not present

## 2018-12-19 MED ORDER — SODIUM CHLORIDE 0.9 % IV SOLN
Freq: Once | INTRAVENOUS | Status: AC
  Administered 2018-12-19: 15:00:00 via INTRAVENOUS
  Filled 2018-12-19: qty 250

## 2018-12-19 MED ORDER — SODIUM CHLORIDE 0.9 % IV SOLN
510.0000 mg | Freq: Once | INTRAVENOUS | Status: AC
  Administered 2018-12-19: 510 mg via INTRAVENOUS
  Filled 2018-12-19: qty 17

## 2018-12-20 ENCOUNTER — Encounter: Payer: Self-pay | Admitting: Oncology

## 2018-12-20 NOTE — Progress Notes (Signed)
I connected with Jamie Bennett on 12/20/18 at  2:15 PM EDT by video enabled telemedicine visit and verified that I am speaking with the correct person using two identifiers.   I discussed the limitations, risks, security and privacy concerns of performing an evaluation and management service by telemedicine and the availability of in-person appointments. I also discussed with the patient that there may be a patient responsible charge related to this service. The patient expressed understanding and agreed to proceed.  Other persons participating in the visit and their role in the encounter:  none  Patient's location:  home Provider's location:  home  Chief Complaint:  Routine f/u of iron and b12 deficiency anemia  History of present illness:  Patient is a 58 year old female with history of gastric bypass surgery in 2003.  She is subsequent developed iron and B12 deficiency and has been getting IV iron from time to time.  She was previously seen Dr. Mike Gip and transferring her care to.  Interval history reports some chronic fatigue. Denies other complaints at this time   Review of Systems  Constitutional: Positive for malaise/fatigue. Negative for chills, fever and weight loss.  HENT: Negative for congestion, ear discharge and nosebleeds.   Eyes: Negative for blurred vision.  Respiratory: Negative for cough, hemoptysis, sputum production, shortness of breath and wheezing.   Cardiovascular: Negative for chest pain, palpitations, orthopnea and claudication.  Gastrointestinal: Negative for abdominal pain, blood in stool, constipation, diarrhea, heartburn, melena, nausea and vomiting.  Genitourinary: Negative for dysuria, flank pain, frequency, hematuria and urgency.  Musculoskeletal: Negative for back pain, joint pain and myalgias.  Skin: Negative for rash.  Neurological: Negative for dizziness, tingling, focal weakness, seizures, weakness and headaches.  Endo/Heme/Allergies: Does not  bruise/bleed easily.  Psychiatric/Behavioral: Negative for depression and suicidal ideas. The patient does not have insomnia.     Allergies  Allergen Reactions  . Lactose Intolerance (Gi) Other (See Comments)    Bloating and GI distress  . Sulfa Antibiotics Hives    Past Medical History:  Diagnosis Date  . Anemia   . Anxiety   . Bipolar 1 disorder ([place])   . Collagen vascular disease ([place])    rhematoid arthritis  . Constipation   . Fibromyalgia   . GERD (gastroesophageal reflux disease)   . Heart murmur    mild-asymptomatic  . Hepatic steatosis   . History of Roux-en-Y gastric bypass   . Hypotension   . Hypothyroidism   . Opioid abuse ([place])   . Osteoarthritis   . Osteoporosis   . PONV (postoperative nausea and vomiting)    nausea only  . Rheumatic fever   . Rheumatoid arthritis ([place])   . Thyroid disease     Past Surgical History:  Procedure Laterality Date  . CHOLECYSTECTOMY  2004  . COLONOSCOPY N/A 07/16/2017   Procedure: COLONOSCOPY;  Surgeon: Manya Silvas, MD;  Location: [facility] ENDOSCOPY;  Service: Endoscopy;  Laterality: N/A;  . EXPLORATORY LAPAROTOMY  2009  . GASTRIC BYPASS  2003   [place] N/A 03/01/2018   Procedure: EXPLORATORY LAPAROTOMY/ UMBILECTOMY;  Surgeon: Robert Bellow, MD;  Location: [facility] ORS;  Service: General;  Laterality: N/A;  . TONSILLECTOMY    . VENTRAL HERNIA REPAIR N/A 03/07/2018   Procedure: HERNIA REPAIR VENTRAL ADULT;  Surgeon: Robert Bellow, MD;  Location: [facility] ORS;  Service: General;  Laterality: N/A;    Social History   Socioeconomic History  . Marital status: Married    Spouse name: Not  on file  . Number of children: Not on file  . Years of education: Not on file  . Highest education level: Not on file  Occupational History  . Not on file  Social Needs  . Financial resource strain: Not on file  . Food insecurity:    Worry: Not on file    Inability: Not on file  . Transportation needs:     Medical: Not on file    Non-medical: Not on file  Tobacco Use  . Smoking status: Never Smoker  . Smokeless tobacco: Never Used  Substance and Sexual Activity  . Alcohol use: No    Alcohol/week: 0.0 standard drinks  . Drug use: No  . Sexual activity: Yes  Lifestyle  . Physical activity:    Days per week: Not on file    Minutes per session: Not on file  . Stress: Not on file  Relationships  . Social connections:    Talks on phone: Not on file    Gets together: Not on file    Attends religious service: Not on file    Active member of club or organization: Not on file    Attends meetings of clubs or organizations: Not on file    Relationship status: Not on file  . Intimate partner violence:    Fear of current or ex partner: Not on file    Emotionally abused: Not on file    Physically abused: Not on file    Forced sexual activity: Not on file  Other Topics Concern  . Not on file  Social History Narrative  . Not on file    Family History  Problem Relation Age of Onset  . Depression Mother   . Dementia Mother   . Heart disease Mother   . Osteoporosis Mother   . Parkinson's disease Father   . Colon cancer Neg Hx      Current Outpatient Medications:  .  albuterol (VENTOLIN HFA) 108 (90 Base) MCG/ACT inhaler, Inhale 2 puffs into the lungs every 6 (six) hours as needed for wheezing or shortness of breath., Disp: 1 Inhaler, Rfl: 3 .  aspirin EC 81 MG tablet, Take 81 mg by mouth daily., Disp: , Rfl:  .  busPIRone (BUSPAR) 15 MG tablet, Take 1 tablet (15 mg total) by mouth 4 (four) times daily., Disp: 360 tablet, Rfl: 1 .  Cholecalciferol (HM VITAMIN D3) 4000 units CAPS, Take 4,000 Units by mouth daily. , Disp: , Rfl:  .  clonazePAM (KLONOPIN) 0.5 MG tablet, Take one tablet by mouth for acute anxiety, EMERGENCIES only - do not exceed 1 tab daily, Disp: 30 tablet, Rfl: 3 .  Coenzyme Q-10 200 MG CAPS, Take 200 mg by mouth 2 (two) times daily. , Disp: , Rfl:  .  cyanocobalamin  (,VITAMIN B-12,) 1000 MCG/ML injection, Inject 1,000 mcg into the muscle every 30 (thirty) days., Disp: , Rfl:  .  desipramine (NORPRAMIN) 25 MG tablet, 1  qam, Disp: 90 tablet, Rfl: 0 .  esomeprazole (NEXIUM) 20 MG capsule, Take 20 mg by mouth daily. , Disp: , Rfl:  .  Fiber, Guar Gum, CHEW, Chew 5 mg by mouth daily. , Disp: , Rfl:  .  hydroxychloroquine (PLAQUENIL) 200 MG tablet, Take one tablet by mouth twice a day Monday- Friday, Disp: 120 tablet, Rfl: 0 .  hydrOXYzine (ATARAX/VISTARIL) 50 MG tablet, Take 1 tablet (50 mg total) by mouth 3 (three) times daily as needed for anxiety., Disp: 270 tablet, Rfl: 1 .  ibuprofen (ADVIL,MOTRIN) 100 MG tablet, Take 100 mg by mouth as needed for fever., Disp: , Rfl:  .  Krill Oil 500 MG CAPS, Take 500 mg by mouth daily., Disp: , Rfl:  .  lamoTRIgine (LAMICTAL) 150 MG tablet, Take 1 tablet (150 mg total) by mouth 2 (two) times daily., Disp: 180 tablet, Rfl: 1 .  levothyroxine (SYNTHROID) 112 MCG tablet, Take 1 tablet (112 mcg total) by mouth daily., Disp: 90 tablet, Rfl: 0 .  loratadine (CLARITIN) 10 MG tablet, Take 10 mg by mouth daily as needed for allergies., Disp: , Rfl:  .  polyethylene glycol (MIRALAX) packet, Take 17 g by mouth daily., Disp: 14 each, Rfl: 0 .  sucralfate (CARAFATE) 1 g tablet, Take 1 tablet (1 g total) by mouth 2 (two) times daily., Disp: 180 tablet, Rfl: 3 .  tiZANidine (ZANAFLEX) 4 MG tablet, Take 1 tablet (4 mg total) by mouth 3 (three) times daily as needed., Disp: 90 tablet, Rfl: 0 .  traZODone (DESYREL) 100 MG tablet, Take 1 tablet (100 mg total) by mouth at bedtime as needed and may repeat dose one time if needed for sleep., Disp: 60 tablet, Rfl: 5 .  venlafaxine (EFFEXOR) 100 MG tablet, Take 1 tablet (100 mg total) by mouth 3 (three) times daily with meals., Disp: 90 tablet, Rfl: 4 .  zoledronic acid (RECLAST) 5 MG/100ML SOLN injection, Inject 5 mg into the vein See admin instructions. Patient takes yearly, Disp: , Rfl:   No  results found.  No images are attached to the encounter.   CMP Latest Ref Rng & Units 08/20/2018  Glucose 65 - 99 mg/dL 89  BUN 7 - 25 mg/dL 15  Creatinine 0.50 - 1.05 mg/dL 1.15(H)  Sodium 135 - 146 mmol/L 140  Potassium 3.5 - 5.3 mmol/L 4.5  Chloride 98 - 110 mmol/L 110  CO2 20 - 32 mmol/L 20  Calcium 8.6 - 10.4 mg/dL 9.1  Total Protein 6.1 - 8.1 g/dL 6.6  Total Bilirubin 0.2 - 1.2 mg/dL 0.3  Alkaline Phos 38 - 126 U/L -  AST 10 - 35 U/L 19  ALT 6 - 29 U/L 13   CBC Latest Ref Rng & Units 12/13/2018  WBC 4.0 - 10.5 K/uL 7.7  Hemoglobin 12.0 - 15.0 g/dL 13.5  Hematocrit 36.0 - 46.0 % 40.5  Platelets 150 - 400 K/uL 249     Observation/objective: appears in no acute distress over video visit today. Breathing is non labored  Assessment and plan: Patient is a 58 year old female with a history of iron B12 deficiency anemia possibly secondary to gastric bypass.  This is a follow-up visit  Recent labs in May 2020 showed a white count of 7.7, H&H of 13.5/40.5 and a normal platelet count of 249.  Her ferritin was low at 27.  Iron study showed an elevated TIBC of 462.  She will therefore proceed with 2 doses of Feraheme at this time.  Follow-up instructions: Repeat CBC ferritin iron studies in 3 months in 6 months and I will see him back in 6 months.  We will restart B12 monthly will be starting routine B12 injections after covid restrictions are lifted  I discussed the assessment and treatment plan with the patient. The patient was provided an opportunity to ask questions and all were answered. The patient agreed with the plan and demonstrated an understanding of the instructions.   The patient was advised to call back or seek an in-person evaluation if the symptoms worsen or if  the condition fails to improve as anticipated.    Visit Diagnosis: 1. Iron deficiency anemia following bariatric surgery     Dr. Randa Evens, MD, MPH [facility] [phone] 12/20/2018 12:19 PM

## 2018-12-24 DIAGNOSIS — F4010 Social phobia, unspecified: Secondary | ICD-10-CM | POA: Diagnosis not present

## 2018-12-24 DIAGNOSIS — F603 Borderline personality disorder: Secondary | ICD-10-CM | POA: Diagnosis not present

## 2018-12-24 DIAGNOSIS — R69 Illness, unspecified: Secondary | ICD-10-CM | POA: Diagnosis not present

## 2018-12-26 ENCOUNTER — Other Ambulatory Visit: Payer: Self-pay

## 2018-12-26 ENCOUNTER — Inpatient Hospital Stay: Payer: Medicare HMO

## 2018-12-26 VITALS — BP 102/66 | HR 69 | Temp 96.0°F | Resp 18

## 2018-12-26 DIAGNOSIS — E538 Deficiency of other specified B group vitamins: Secondary | ICD-10-CM | POA: Diagnosis not present

## 2018-12-26 DIAGNOSIS — D509 Iron deficiency anemia, unspecified: Secondary | ICD-10-CM | POA: Diagnosis not present

## 2018-12-26 DIAGNOSIS — Z79899 Other long term (current) drug therapy: Secondary | ICD-10-CM | POA: Diagnosis not present

## 2018-12-26 MED ORDER — SODIUM CHLORIDE 0.9 % IV SOLN
Freq: Once | INTRAVENOUS | Status: AC
  Administered 2018-12-26: 14:00:00 via INTRAVENOUS
  Filled 2018-12-26: qty 250

## 2018-12-26 MED ORDER — SODIUM CHLORIDE 0.9 % IV SOLN
510.0000 mg | Freq: Once | INTRAVENOUS | Status: AC
  Administered 2018-12-26: 510 mg via INTRAVENOUS
  Filled 2018-12-26: qty 17

## 2018-12-26 NOTE — Progress Notes (Signed)
Pt tolerated infusion well. Pt and VS stable at discharge.  

## 2019-01-01 ENCOUNTER — Other Ambulatory Visit: Payer: Self-pay

## 2019-01-01 MED ORDER — TIZANIDINE HCL 4 MG PO TABS: 4.0000 mg | ORAL_TABLET | Freq: Three times a day (TID) | ORAL | 1 refills | Status: DC | PRN

## 2019-01-07 DIAGNOSIS — R69 Illness, unspecified: Secondary | ICD-10-CM | POA: Diagnosis not present

## 2019-01-07 DIAGNOSIS — F603 Borderline personality disorder: Secondary | ICD-10-CM | POA: Diagnosis not present

## 2019-01-07 DIAGNOSIS — F4010 Social phobia, unspecified: Secondary | ICD-10-CM | POA: Diagnosis not present

## 2019-01-13 ENCOUNTER — Inpatient Hospital Stay: Payer: Medicare HMO | Attending: Oncology

## 2019-01-13 ENCOUNTER — Other Ambulatory Visit: Payer: Self-pay

## 2019-01-13 DIAGNOSIS — E538 Deficiency of other specified B group vitamins: Secondary | ICD-10-CM | POA: Diagnosis not present

## 2019-01-13 DIAGNOSIS — Z79899 Other long term (current) drug therapy: Secondary | ICD-10-CM | POA: Diagnosis not present

## 2019-01-13 MED ORDER — CYANOCOBALAMIN 1000 MCG/ML IJ SOLN
1000.0000 ug | Freq: Once | INTRAMUSCULAR | Status: AC
  Administered 2019-01-13: 1000 ug via INTRAMUSCULAR

## 2019-01-16 ENCOUNTER — Ambulatory Visit: Payer: Self-pay | Admitting: Physician Assistant

## 2019-01-21 DIAGNOSIS — F4010 Social phobia, unspecified: Secondary | ICD-10-CM | POA: Diagnosis not present

## 2019-01-21 DIAGNOSIS — R69 Illness, unspecified: Secondary | ICD-10-CM | POA: Diagnosis not present

## 2019-01-21 DIAGNOSIS — F603 Borderline personality disorder: Secondary | ICD-10-CM | POA: Diagnosis not present

## 2019-02-03 ENCOUNTER — Other Ambulatory Visit (HOSPITAL_COMMUNITY): Payer: Self-pay

## 2019-02-03 ENCOUNTER — Other Ambulatory Visit: Payer: Self-pay

## 2019-02-03 MED ORDER — DESIPRAMINE HCL 25 MG PO TABS: ORAL_TABLET | ORAL | 0 refills | Status: DC

## 2019-02-03 MED ORDER — LEVOTHYROXINE SODIUM 112 MCG PO TABS: 112.0000 ug | ORAL_TABLET | Freq: Every day | ORAL | 0 refills | Status: DC

## 2019-02-04 DIAGNOSIS — F603 Borderline personality disorder: Secondary | ICD-10-CM | POA: Diagnosis not present

## 2019-02-04 DIAGNOSIS — F4010 Social phobia, unspecified: Secondary | ICD-10-CM | POA: Diagnosis not present

## 2019-02-04 DIAGNOSIS — R69 Illness, unspecified: Secondary | ICD-10-CM | POA: Diagnosis not present

## 2019-02-10 ENCOUNTER — Telehealth: Payer: Self-pay | Admitting: Rheumatology

## 2019-02-10 DIAGNOSIS — M0579 Rheumatoid arthritis with rheumatoid factor of multiple sites without organ or systems involvement: Secondary | ICD-10-CM

## 2019-02-10 NOTE — Telephone Encounter (Signed)
Patient called requesting prescription refill of Plaquenil (3 month supply) sent to new [facility] in [place].   [phone]

## 2019-02-11 ENCOUNTER — Other Ambulatory Visit: Payer: Self-pay

## 2019-02-11 MED ORDER — HYDROXYCHLOROQUINE SULFATE 200 MG PO TABS: ORAL_TABLET | ORAL | 0 refills | Status: DC

## 2019-02-11 NOTE — Telephone Encounter (Signed)
Last Visit: 12/09/18 Next Visit: 05/13/19 Labs: 12/13/18 WNL 08/20/18 Creatinine elevated and GFR is low.  Eye exam: 06/24/18 WNL   Patient advised she is due to update labs. Patient advised of lab hours and will update labs  Okay to refill 30 day supply per Dr. Estanislado Pandy

## 2019-02-12 ENCOUNTER — Other Ambulatory Visit: Payer: Self-pay

## 2019-02-12 ENCOUNTER — Inpatient Hospital Stay: Payer: Medicare HMO

## 2019-02-12 ENCOUNTER — Inpatient Hospital Stay: Payer: Medicare HMO | Attending: Oncology

## 2019-02-12 DIAGNOSIS — E538 Deficiency of other specified B group vitamins: Secondary | ICD-10-CM | POA: Insufficient documentation

## 2019-02-12 DIAGNOSIS — D509 Iron deficiency anemia, unspecified: Secondary | ICD-10-CM | POA: Diagnosis not present

## 2019-02-12 MED ORDER — CYANOCOBALAMIN 1000 MCG/ML IJ SOLN
1000.0000 ug | Freq: Once | INTRAMUSCULAR | Status: AC
  Administered 2019-02-12: 1000 ug via INTRAMUSCULAR

## 2019-02-18 DIAGNOSIS — R69 Illness, unspecified: Secondary | ICD-10-CM | POA: Diagnosis not present

## 2019-02-18 DIAGNOSIS — F603 Borderline personality disorder: Secondary | ICD-10-CM | POA: Diagnosis not present

## 2019-02-18 DIAGNOSIS — F4010 Social phobia, unspecified: Secondary | ICD-10-CM | POA: Diagnosis not present

## 2019-02-19 ENCOUNTER — Other Ambulatory Visit: Payer: Self-pay

## 2019-02-19 ENCOUNTER — Ambulatory Visit (INDEPENDENT_AMBULATORY_CARE_PROVIDER_SITE_OTHER): Payer: Medicare HMO | Admitting: Psychiatry

## 2019-02-19 DIAGNOSIS — F332 Major depressive disorder, recurrent severe without psychotic features: Secondary | ICD-10-CM

## 2019-02-19 DIAGNOSIS — F330 Major depressive disorder, recurrent, mild: Secondary | ICD-10-CM | POA: Diagnosis not present

## 2019-02-19 DIAGNOSIS — F411 Generalized anxiety disorder: Secondary | ICD-10-CM | POA: Diagnosis not present

## 2019-02-19 DIAGNOSIS — F603 Borderline personality disorder: Secondary | ICD-10-CM | POA: Diagnosis not present

## 2019-02-19 DIAGNOSIS — R69 Illness, unspecified: Secondary | ICD-10-CM | POA: Diagnosis not present

## 2019-02-19 MED ORDER — HYDROXYZINE HCL 50 MG PO TABS: 50.0000 mg | ORAL_TABLET | Freq: Three times a day (TID) | ORAL | 1 refills | Status: DC | PRN

## 2019-02-19 MED ORDER — DESIPRAMINE HCL 25 MG PO TABS: ORAL_TABLET | ORAL | 0 refills | Status: DC

## 2019-02-19 MED ORDER — VENLAFAXINE HCL 100 MG PO TABS: 100.0000 mg | ORAL_TABLET | Freq: Three times a day (TID) | ORAL | 4 refills | Status: DC

## 2019-02-19 MED ORDER — BUSPIRONE HCL 30 MG PO TABS: ORAL_TABLET | ORAL | 1 refills | Status: DC

## 2019-02-19 MED ORDER — LAMOTRIGINE 150 MG PO TABS: 150.0000 mg | ORAL_TABLET | Freq: Two times a day (BID) | ORAL | 1 refills | Status: DC

## 2019-02-19 NOTE — Progress Notes (Signed)
`  Psychiatric Initial Adult Assessment   Patient Identification: Jamie Bennett MRN:  [id number] Date of Evaluation:  02/19/2019 Referral Source: grams per previous psychiatrist Chief Complaint:   Visit Diagnosis: borderline personality disorder   ICD-10-CM   1. Anxiety state  F41.1 venlafaxine (EFFEXOR) 100 MG tablet    hydrOXYzine (ATARAX/VISTARIL) 50 MG tablet    busPIRone (BUSPAR) 30 MG tablet  2. Borderline personality disorder ([place])  F60.3 venlafaxine (EFFEXOR) 100 MG tablet    lamoTRIgine (LAMICTAL) 150 MG tablet  3. Major depressive disorder, recurrent, severe without psychotic features (HCC)  F33.2 venlafaxine (EFFEXOR) 100 MG tablet    lamoTRIgine (LAMICTAL) 150 MG tablet    History of Present Illness:  Today the patient is interviewed with her husband [name].  The patient is actually doing quite well.  Her mood is stable.  She is tolerating the viral pandemic well.  Her husband her to get together and walked now and then they spend a lot of time watching TV together.  The patient likes to quilt.  She quilts and watches TV.  She takes multiple psychotropic medicines.  She also is in psychotherapy which she is getting DBT regularly..  She apparently has made a lot of progress.  The patient denies daily depression.  She denies a lot of anxiety.  She has short episodes of anxiety but it is well contained.  Generally she is sleeping and eating well.  Her energy level is described as being low.  The patient still does what she needs to do.  She can concentrate quite well.  Her self-esteem is clearly improved.  She has no agitation and no psychosis.  She denies any use of alcohol or drugs.  Patient is not suicidal.  She denies chest pain shortness of breath or any neurological symptoms at this time.  She is functioning quite well. Associated Signs/Symptoms: Depression Symptoms:  fatigue, (Hypo) Manic Symptoms:   Anxiety Symptoms:   Psychotic Symptoms:   PTSD Symptoms:   Past  Psychiatric History: 10 psychiatric hospitalizations multiple psychotropic medications presently in psychotherapy  Previous Psychotropic Medications: Yes   Substance Abuse History in the last 12 months:  Yes.    Consequences of Substance Abuse:   Past Medical History:  Past Medical History:  Diagnosis Date  . Anemia   . Anxiety   . Bipolar 1 disorder ([place])   . Collagen vascular disease ([place])    rhematoid arthritis  . Constipation   . Fibromyalgia   . GERD (gastroesophageal reflux disease)   . Heart murmur    mild-asymptomatic  . Hepatic steatosis   . History of Roux-en-Y gastric bypass   . Hypotension   . Hypothyroidism   . Opioid abuse ([place])   . Osteoarthritis   . Osteoporosis   . PONV (postoperative nausea and vomiting)    nausea only  . Rheumatic fever   . Rheumatoid arthritis ([place])   . Thyroid disease     Past Surgical History:  Procedure Laterality Date  . CHOLECYSTECTOMY  2004  . COLONOSCOPY N/A 07/16/2017   Procedure: COLONOSCOPY;  Surgeon: Manya Silvas, MD;  Location: [facility] ENDOSCOPY;  Service: Endoscopy;  Laterality: N/A;  . EXPLORATORY LAPAROTOMY  2009  . GASTRIC BYPASS  2003   [place] N/A 03/01/2018   Procedure: EXPLORATORY LAPAROTOMY/ UMBILECTOMY;  Surgeon: Robert Bellow, MD;  Location: [facility] ORS;  Service: General;  Laterality: N/A;  . TONSILLECTOMY    . VENTRAL HERNIA REPAIR N/A 03/07/2018  Procedure: HERNIA REPAIR VENTRAL ADULT;  Surgeon: Robert Bellow, MD;  Location: [facility] ORS;  Service: General;  Laterality: N/A;    Family Psychiatric History:   Family History:  Family History  Problem Relation Age of Onset  . Depression Mother   . Dementia Mother   . Heart disease Mother   . Osteoporosis Mother   . Parkinson's disease Father   . Colon cancer Neg Hx     Social History:   Social History   Socioeconomic History  . Marital status: Married    Spouse name: Not on file  . Number of children: Not on file  .  Years of education: Not on file  . Highest education level: Not on file  Occupational History  . Not on file  Social Needs  . Financial resource strain: Not on file  . Food insecurity    Worry: Not on file    Inability: Not on file  . Transportation needs    Medical: Not on file    Non-medical: Not on file  Tobacco Use  . Smoking status: Never Smoker  . Smokeless tobacco: Never Used  Substance and Sexual Activity  . Alcohol use: No    Alcohol/week: 0.0 standard drinks  . Drug use: No  . Sexual activity: Yes  Lifestyle  . Physical activity    Days per week: Not on file    Minutes per session: Not on file  . Stress: Not on file  Relationships  . Social Herbalist on phone: Not on file    Gets together: Not on file    Attends religious service: Not on file    Active member of club or organization: Not on file    Attends meetings of clubs or organizations: Not on file    Relationship status: Not on file  Other Topics Concern  . Not on file  Social History Narrative  . Not on file    Additional Social History:   Allergies:   Allergies  Allergen Reactions  . Lactose Intolerance (Gi) Other (See Comments)    Bloating and GI distress  . Sulfa Antibiotics Hives    Metabolic Disorder Labs: No results found for: HGBA1C, MPG No results found for: PROLACTIN Lab Results  Component Value Date   TRIG 176 (H) 11/09/2015     Current Medications: Current Outpatient Medications  Medication Sig Dispense Refill  . albuterol (VENTOLIN HFA) 108 (90 Base) MCG/ACT inhaler Inhale 2 puffs into the lungs every 6 (six) hours as needed for wheezing or shortness of breath. 1 Inhaler 3  . aspirin EC 81 MG tablet Take 81 mg by mouth daily.    . busPIRone (BUSPAR) 30 MG tablet 1 bid 180 tablet 1  . Cholecalciferol (HM VITAMIN D3) 4000 units CAPS Take 4,000 Units by mouth daily.     . clonazePAM (KLONOPIN) 0.5 MG tablet Take one tablet by mouth for acute anxiety, EMERGENCIES  only - do not exceed 1 tab daily 30 tablet 3  . Coenzyme Q-10 200 MG CAPS Take 200 mg by mouth 2 (two) times daily.     . cyanocobalamin (,VITAMIN B-12,) 1000 MCG/ML injection Inject 1,000 mcg into the muscle every 30 (thirty) days.    Marland Kitchen desipramine (NORPRAMIN) 25 MG tablet 1  qam 90 tablet 0  . esomeprazole (NEXIUM) 20 MG capsule Take 20 mg by mouth daily.     . Fiber, Guar Gum, CHEW Chew 5 mg by mouth daily.     Marland Kitchen  hydroxychloroquine (PLAQUENIL) 200 MG tablet Take one tablet by mouth twice a day Monday- Friday 40 tablet 0  . hydrOXYzine (ATARAX/VISTARIL) 50 MG tablet Take 1 tablet (50 mg total) by mouth 3 (three) times daily as needed for anxiety. 270 tablet 1  . ibuprofen (ADVIL,MOTRIN) 100 MG tablet Take 100 mg by mouth as needed for fever.    Javier Docker Oil 500 MG CAPS Take 500 mg by mouth daily.    Marland Kitchen lamoTRIgine (LAMICTAL) 150 MG tablet Take 1 tablet (150 mg total) by mouth 2 (two) times daily. 180 tablet 1  . levothyroxine (SYNTHROID) 112 MCG tablet Take 1 tablet (112 mcg total) by mouth daily. 90 tablet 0  . loratadine (CLARITIN) 10 MG tablet Take 10 mg by mouth daily as needed for allergies.    . polyethylene glycol (MIRALAX) packet Take 17 g by mouth daily. 14 each 0  . sucralfate (CARAFATE) 1 g tablet Take 1 tablet (1 g total) by mouth 2 (two) times daily. 180 tablet 3  . tiZANidine (ZANAFLEX) 4 MG tablet Take 1 tablet (4 mg total) by mouth 3 (three) times daily as needed. 90 tablet 1  . traZODone (DESYREL) 100 MG tablet Take 1 tablet (100 mg total) by mouth at bedtime as needed and may repeat dose one time if needed for sleep. 60 tablet 5  . venlafaxine (EFFEXOR) 100 MG tablet Take 1 tablet (100 mg total) by mouth 3 (three) times daily with meals. 90 tablet 4  . zoledronic acid (RECLAST) 5 MG/100ML SOLN injection Inject 5 mg into the vein See admin instructions. Patient takes yearly     No current facility-administered medications for this visit.     Neurologic: Headache: No Seizure:  No Paresthesias:NA  Musculoskeletal: Strength & Muscle Tone: within normal limits Gait & Station: normal Patient leans: N/A  Psychiatric Specialty Exam: ROS  There were no vitals taken for this visit.There is no height or weight on file to calculate BMI.  General Appearance: Bizarre  Eye Contact:  Good  Speech:  Normal Rate  Volume:  Normal  Mood:  Anxious  Affect:  Congruent  Thought Process:  Goal Directed  Orientation:  NA  Thought Content:  Logical  Suicidal Thoughts:  No  Homicidal Thoughts:  No  Memory:  Negative  Judgement:  Fair  Insight:  NA and Good  Psychomotor Activity:  Normal  Concentration:    Recall:  Good  Fund of Knowledge:  Language: Good  Akathisia:  No  Handed:  Right  AIMS (if indicated):    Assets:  Desire for Improvement  ADL's:  Intact  Cognition: WNL  Sleep:      Treatment Plan Summary: This patient has multiple problems.  Her first problem is that of borderline personality disorder which seems to be responsive to psychotherapy.  Her second problem is likely that of a form of bipolar disorder probably mixed.  She takes Lamictal at a relatively high dose.  Her third problem is major clinical depression.  She takes Effexor and desipramine and does well with this.  She also takes BuSpar on a regular basis.  The combinations of all these medicines together with psychotherapy resulted in individual seems to be fairly centered and doing fairly well.  We will continue all his medicines and see this patient again back here in 3 months.  She certainly is not suicidal.  Jerral Ralph, MD 7/22/20204:06 PM

## 2019-02-25 ENCOUNTER — Other Ambulatory Visit: Payer: Self-pay

## 2019-02-25 DIAGNOSIS — Z79899 Other long term (current) drug therapy: Secondary | ICD-10-CM | POA: Diagnosis not present

## 2019-02-25 DIAGNOSIS — K9589 Other complications of other bariatric procedure: Secondary | ICD-10-CM

## 2019-02-25 DIAGNOSIS — D509 Iron deficiency anemia, unspecified: Secondary | ICD-10-CM

## 2019-02-25 DIAGNOSIS — D508 Other iron deficiency anemias: Secondary | ICD-10-CM

## 2019-02-26 ENCOUNTER — Other Ambulatory Visit: Payer: Self-pay | Admitting: *Deleted

## 2019-02-26 ENCOUNTER — Telehealth: Payer: Self-pay | Admitting: *Deleted

## 2019-02-26 DIAGNOSIS — M0579 Rheumatoid arthritis with rheumatoid factor of multiple sites without organ or systems involvement: Secondary | ICD-10-CM

## 2019-02-26 DIAGNOSIS — E538 Deficiency of other specified B group vitamins: Secondary | ICD-10-CM

## 2019-02-26 DIAGNOSIS — D509 Iron deficiency anemia, unspecified: Secondary | ICD-10-CM

## 2019-02-26 LAB — CBC WITH DIFFERENTIAL/PLATELET
Absolute Monocytes: 482 cells/uL (ref 200–950)
Basophils Absolute: 21 cells/uL (ref 0–200)
Basophils Relative: 0.4 %
Eosinophils Absolute: 201 cells/uL (ref 15–500)
Eosinophils Relative: 3.8 %
HCT: 41.9 % (ref 35.0–45.0)
Hemoglobin: 14.3 g/dL (ref 11.7–15.5)
Lymphs Abs: 1961 cells/uL (ref 850–3900)
MCH: 31.2 pg (ref 27.0–33.0)
MCHC: 34.1 g/dL (ref 32.0–36.0)
MCV: 91.3 fL (ref 80.0–100.0)
MPV: 9.3 fL (ref 7.5–12.5)
Monocytes Relative: 9.1 %
Neutro Abs: 2634 cells/uL (ref 1500–7800)
Neutrophils Relative %: 49.7 %
Platelets: 264 10*3/uL (ref 140–400)
RBC: 4.59 10*6/uL (ref 3.80–5.10)
RDW: 13 % (ref 11.0–15.0)
Total Lymphocyte: 37 %
WBC: 5.3 10*3/uL (ref 3.8–10.8)

## 2019-02-26 LAB — COMPLETE METABOLIC PANEL WITH GFR
AG Ratio: 1.8 (calc) (ref 1.0–2.5)
ALT: 15 U/L (ref 6–29)
AST: 18 U/L (ref 10–35)
Albumin: 4.3 g/dL (ref 3.6–5.1)
Alkaline phosphatase (APISO): 101 U/L (ref 37–153)
BUN: 17 mg/dL (ref 7–25)
CO2: 23 mmol/L (ref 20–32)
Calcium: 10.2 mg/dL (ref 8.6–10.4)
Chloride: 106 mmol/L (ref 98–110)
Creat: 0.89 mg/dL (ref 0.50–1.05)
GFR, Est African American: 83 mL/min/{1.73_m2} (ref >=60)
GFR, Est Non African American: 71 mL/min/{1.73_m2} (ref >=60)
Globulin: 2.4 g/dL (calc) (ref 1.9–3.7)
Glucose, Bld: 93 mg/dL (ref 65–99)
Potassium: 4.4 mmol/L (ref 3.5–5.3)
Sodium: 141 mmol/L (ref 135–146)
Total Bilirubin: 0.4 mg/dL (ref 0.2–1.2)
Total Protein: 6.7 g/dL (ref 6.1–8.1)

## 2019-02-26 MED ORDER — HYDROXYCHLOROQUINE SULFATE 200 MG PO TABS: ORAL_TABLET | ORAL | 0 refills | Status: DC

## 2019-02-26 NOTE — Telephone Encounter (Signed)
-----   Message from Ofilia Neas, PA-C sent at 02/26/2019  8:04 AM EDT ----- CBC and CMP WNL.

## 2019-02-26 NOTE — Progress Notes (Signed)
CBC and CMP WNL

## 2019-02-26 NOTE — Telephone Encounter (Signed)
Patient states she has not picked up the 30 day supply of PLQ sent to the pharmacy. Patient states there is a cost difference between a 30 day and a 90 day supply. Patient advised will send in a new prescription for 90 day supply.   Last Visit: 12/09/18 Next Visit: 05/13/19 Labs: 02/25/19 WNL Eye exam: 06/24/18 WNL   Okay to refill per Dr. Estanislado Pandy

## 2019-03-04 DIAGNOSIS — R69 Illness, unspecified: Secondary | ICD-10-CM | POA: Diagnosis not present

## 2019-03-04 DIAGNOSIS — F4010 Social phobia, unspecified: Secondary | ICD-10-CM | POA: Diagnosis not present

## 2019-03-04 DIAGNOSIS — F603 Borderline personality disorder: Secondary | ICD-10-CM | POA: Diagnosis not present

## 2019-03-13 ENCOUNTER — Other Ambulatory Visit: Payer: Self-pay

## 2019-03-13 ENCOUNTER — Other Ambulatory Visit: Payer: Self-pay | Admitting: Nurse Practitioner

## 2019-03-13 MED ORDER — TIZANIDINE HCL 4 MG PO TABS: 4.0000 mg | ORAL_TABLET | Freq: Three times a day (TID) | ORAL | 2 refills | Status: DC | PRN

## 2019-03-14 ENCOUNTER — Inpatient Hospital Stay: Payer: Medicare HMO

## 2019-03-14 ENCOUNTER — Other Ambulatory Visit: Payer: Self-pay

## 2019-03-14 ENCOUNTER — Inpatient Hospital Stay: Payer: Medicare HMO | Attending: Oncology

## 2019-03-14 DIAGNOSIS — Z79899 Other long term (current) drug therapy: Secondary | ICD-10-CM | POA: Diagnosis not present

## 2019-03-14 DIAGNOSIS — E538 Deficiency of other specified B group vitamins: Secondary | ICD-10-CM | POA: Diagnosis not present

## 2019-03-14 MED ORDER — CYANOCOBALAMIN 1000 MCG/ML IJ SOLN
1000.0000 ug | Freq: Once | INTRAMUSCULAR | Status: AC
  Administered 2019-03-14: 1000 ug via INTRAMUSCULAR
  Filled 2019-03-14: qty 1

## 2019-03-25 DIAGNOSIS — R69 Illness, unspecified: Secondary | ICD-10-CM | POA: Diagnosis not present

## 2019-03-25 DIAGNOSIS — F603 Borderline personality disorder: Secondary | ICD-10-CM | POA: Diagnosis not present

## 2019-03-25 DIAGNOSIS — F4010 Social phobia, unspecified: Secondary | ICD-10-CM | POA: Diagnosis not present

## 2019-04-11 ENCOUNTER — Other Ambulatory Visit: Payer: Self-pay

## 2019-04-14 ENCOUNTER — Inpatient Hospital Stay: Payer: Medicare HMO

## 2019-04-14 ENCOUNTER — Other Ambulatory Visit: Payer: Self-pay

## 2019-04-14 ENCOUNTER — Inpatient Hospital Stay: Payer: Medicare HMO | Attending: Oncology

## 2019-04-14 DIAGNOSIS — E538 Deficiency of other specified B group vitamins: Secondary | ICD-10-CM

## 2019-04-14 DIAGNOSIS — Z79899 Other long term (current) drug therapy: Secondary | ICD-10-CM | POA: Insufficient documentation

## 2019-04-14 MED ORDER — CYANOCOBALAMIN 1000 MCG/ML IJ SOLN
1000.0000 ug | Freq: Once | INTRAMUSCULAR | Status: AC
  Administered 2019-04-14: 1000 ug via INTRAMUSCULAR

## 2019-04-22 DIAGNOSIS — F4010 Social phobia, unspecified: Secondary | ICD-10-CM | POA: Diagnosis not present

## 2019-04-22 DIAGNOSIS — R69 Illness, unspecified: Secondary | ICD-10-CM | POA: Diagnosis not present

## 2019-04-22 DIAGNOSIS — F603 Borderline personality disorder: Secondary | ICD-10-CM | POA: Diagnosis not present

## 2019-04-26 DIAGNOSIS — R69 Illness, unspecified: Secondary | ICD-10-CM | POA: Diagnosis not present

## 2019-04-29 NOTE — Progress Notes (Signed)
Office Visit Note  Patient: Jamie Bennett             Date of Birth: July 25, 1961           MRN: [id number]             PCP: Ronnell Freshwater, NP Referring: Ronnell Freshwater, NP Visit Date: 05/13/2019 Occupation: @GUAROCC @  Subjective:  Pain in both knee joints   History of Present Illness: Jamie Bennett is a 58 y.o. female with history of seropositive rheumatoid arthritis, osteoarthritis, fibromyalgia, and DDD.  She is taking Plaquenil 200 mg 1 tablet by mouth BID M-F.  She denies any recent rheumatoid arthritis flares.  She continues to have intermittent pain in both hands and both knee joints.  She states she has warmth in both knee joints but no joint swelling.  She states her knee joint pain is worse with frequent weather changes.  She would like bilateral cortisone injections today.  She continues to have trapezius muscle tension and tenderness due to fibromyalgia.  She denies any lower back pain at this time.   She states she was previously receiving reclast, but her recent DEXA was worse than in the past.  She will be switching to prolia sq injections.   Activities of Daily Living:  Patient reports morning stiffness for 1    hour.   Patient Reports nocturnal pain.  Difficulty dressing/grooming: Denies Difficulty climbing stairs: Reports Difficulty getting out of chair: Reports Difficulty using hands for taps, buttons, cutlery, and/or writing: Denies  Review of Systems  Constitutional: Negative for fatigue.  HENT: Negative for mouth sores, mouth dryness and nose dryness.   Eyes: Negative for pain, visual disturbance and dryness.  Respiratory: Negative for cough, hemoptysis, shortness of breath and difficulty breathing.   Cardiovascular: Negative for chest pain, palpitations, hypertension and swelling in legs/feet.  Gastrointestinal: Negative for blood in stool, constipation and diarrhea.  Endocrine: Negative for increased urination.  Genitourinary: Negative for  painful urination.  Musculoskeletal: Negative for arthralgias, joint pain, joint swelling, myalgias, muscle weakness, morning stiffness, muscle tenderness and myalgias.  Skin: Negative for color change, pallor, rash, hair loss, nodules/bumps, skin tightness, ulcers and sensitivity to sunlight.  Allergic/Immunologic: Negative for susceptible to infections.  Neurological: Negative for dizziness, numbness, headaches and weakness.  Hematological: Negative for swollen glands.  Psychiatric/Behavioral: Negative for depressed mood and sleep disturbance. The patient is not nervous/anxious.     PMFS History:  Patient Active Problem List   Diagnosis Date Noted   Exercise-induced asthma 12/15/2018   Screening for breast cancer 05/29/2018   Duodenal ulcer disease 05/29/2018   Screening for malignant neoplasm of cervix 05/29/2018   Dysuria 05/29/2018   Surgical wound dehiscence, initial encounter 04/14/2018   Postoperative abdominal hernia with obstruction    Incarcerated ventral hernia 03/22/2018   SBO (small bowel obstruction) ([place]) 03/07/2018   Abscess of abdominal wall 03/01/2018   Persistent umbilical sinus [id number]   Tinea pedis of both feet 01/23/2018   Atopic dermatitis 12/05/2017   Adjustment disorder with mixed anxiety and depressed mood 03/10/2017   Major depressive disorder 03/10/2017   Primary osteoarthritis of both knees 01/19/2017   Suicide attempt ([place]) 07/10/2016   Fibromyalgia 07/10/2016   High risk medication use 07/10/2016   AKI (acute kidney injury) ([place]) 11/09/2015   Elevated troponin 11/09/2015   Hypotension 11/09/2015   Respiratory failure ([place])    Acute hepatic failure 11/08/2015   Drug overdose 11/08/2015   Fatty infiltration of liver 02/16/2015  Hepatic fibrosis 02/16/2015   Abnormal serum level of alkaline phosphatase 02/15/2015   Iron deficiency anemia 02/05/2015   Vitamin B 12 deficiency 02/05/2015   OP (osteoporosis)  06/16/2014   Rheumatoid arteritis ([place]) 03/02/2014   Hypothyroidism 03/02/2014   Rheumatic fever without heart involvement 03/02/2014   Adult hypothyroidism 03/02/2014   Arthritis of pelvic region, degenerative 03/02/2014   Bipolar 1 disorder, depressed ([place]) 02/26/2014   Bariatric surgery status 11/24/2013   Affective bipolar disorder ([place]) 11/24/2013   Bipolar affective disorder ([place]) 11/24/2013   Rheumatoid arthritis ([place]) 11/24/2013   Polysubstance (excluding opioids) dependence ([place]) 09/11/2013   Polysubstance dependence ([place]) 09/11/2013   Combined drug dependence excluding opioids ([place]) 09/11/2013   Arthritis or polyarthritis, rheumatoid ([place]) 09/05/2013   Leg weakness 09/05/2013    Past Medical History:  Diagnosis Date   Anemia    Anxiety    Bipolar 1 disorder (HCC)    Collagen vascular disease (HCC)    rhematoid arthritis   Constipation    Fibromyalgia    GERD (gastroesophageal reflux disease)    Heart murmur    mild-asymptomatic   Hepatic steatosis    History of Roux-en-Y gastric bypass    Hypotension    Hypothyroidism    Opioid abuse (HCC)    Osteoarthritis    Osteoporosis    PONV (postoperative nausea and vomiting)    nausea only   Rheumatic fever    Rheumatoid arthritis ([place])    Thyroid disease     Family History  Problem Relation Age of Onset   Depression Mother    Dementia Mother    Heart disease Mother    Osteoporosis Mother    Parkinson's disease Father    Colon cancer Neg Hx    Past Surgical History:  Procedure Laterality Date   CHOLECYSTECTOMY  2004   COLONOSCOPY N/A 07/16/2017   Procedure: COLONOSCOPY;  Surgeon: Manya Silvas, MD;  Location: [facility] ENDOSCOPY;  Service: Endoscopy;  Laterality: N/A;   EXPLORATORY LAPAROTOMY  2009   GASTRIC BYPASS  2003   [place] N/A 03/01/2018   Procedure: EXPLORATORY LAPAROTOMY/ UMBILECTOMY;  Surgeon: Robert Bellow, MD;  Location: [facility]  ORS;  Service: General;  Laterality: N/A;   TONSILLECTOMY     VENTRAL HERNIA REPAIR N/A 03/07/2018   Procedure: HERNIA REPAIR VENTRAL ADULT;  Surgeon: Robert Bellow, MD;  Location: [facility] ORS;  Service: General;  Laterality: N/A;   Social History   Social History Narrative   Not on file   Immunization History  Administered Date(s) Administered   Influenza,inj,Quad PF,6+ Mos 04/24/2016   Pneumococcal Polysaccharide-23 03/02/2018     Objective: Vital Signs: BP (!) 147/87 (BP Location: Left Wrist, Patient Position: Sitting, Cuff Size: Normal)    Pulse 89    Resp 16    Ht 5\' 5"  (1.651 m)    Wt (!) 338 lb (153.3 kg)    BMI 56.25 kg/m    Physical Exam Vitals signs and nursing note reviewed.  Constitutional:      Appearance: She is well-developed.  HENT:     Head: Normocephalic and atraumatic.  Eyes:     Conjunctiva/sclera: Conjunctivae normal.  Neck:     Musculoskeletal: Normal range of motion.  Cardiovascular:     Rate and Rhythm: Normal rate and regular rhythm.     Heart sounds: Normal heart sounds.  Pulmonary:     Effort: Pulmonary effort is normal.     Breath sounds: Normal breath sounds.  Abdominal:  General: Bowel sounds are normal.     Palpations: Abdomen is soft.  Lymphadenopathy:     Cervical: No cervical adenopathy.  Skin:    General: Skin is warm and dry.     Capillary Refill: Capillary refill takes less than 2 seconds.  Neurological:     Mental Status: She is alert and oriented to person, place, and time.  Psychiatric:        Behavior: Behavior normal.      Musculoskeletal Exam: C-spine good ROM. Trapezius muscle tension and tenderness bilaterally.  Thoracic kyphosis noted.  Lumbar spine good ROM with no discomfort.  No midline spinal tenderness.  No SI joint tenderness.  Shoulder joints, elbow joints, wrist joints, MCPs, PIPs, and DIPs good ROM with no synovitis.  Complete fist formation bilaterally.  Hip joints good ROM with no discomfort.  Knee  joints good ROM bilaterally.  Warmth but no effusion of knee joints noted.  Ankle joints good ROM with no discomfort. Pedal edema bilaterally.   CDAI Exam: CDAI Score: 1.2  Patient Global: 6 mm; Provider Global: 6 mm Swollen: 0 ; Tender: 0  Joint Exam   No joint exam has been documented for this visit   There is currently no information documented on the homunculus. Go to the Rheumatology activity and complete the homunculus joint exam.  Investigation: No additional findings.  Imaging: No results found.  Recent Labs: Lab Results  Component Value Date   WBC 5.3 02/25/2019   HGB 14.3 02/25/2019   PLT 264 02/25/2019   NA 141 02/25/2019   K 4.4 02/25/2019   CL 106 02/25/2019   CO2 23 02/25/2019   GLUCOSE 93 02/25/2019   BUN 17 02/25/2019   CREATININE 0.89 02/25/2019   BILITOT 0.4 02/25/2019   ALKPHOS 218 (H) 03/07/2018   AST 18 02/25/2019   ALT 15 02/25/2019   PROT 6.7 02/25/2019   ALBUMIN 3.9 03/07/2018   CALCIUM 10.2 02/25/2019   GFRAA 83 02/25/2019    Speciality Comments:  PLQ Eye Exam: 06/24/18 WNL @ [place] follow up in 1 year  Procedures:  Large Joint Inj: bilateral knee on 05/13/2019 2:47 PM Indications: pain Details: 27 G 1.5 in needle, medial approach  Arthrogram: No  Medications (Right): 1.5 mL lidocaine 1 %; 40 mg triamcinolone acetonide 40 MG/ML Aspirate (Right): 0 mL Medications (Left): 1.5 mL lidocaine 1 %; 40 mg triamcinolone acetonide 40 MG/ML Aspirate (Left): 0 mL Outcome: tolerated well, no immediate complications Procedure, treatment alternatives, risks and benefits explained, specific risks discussed. Consent was given by the patient. Immediately prior to procedure a time out was called to verify the correct patient, procedure, equipment, support staff and site/side marked as required. Patient was prepped and draped in the usual sterile fashion.     Allergies: Lactose intolerance (gi) and Sulfa antibiotics   Assessment / Plan:      Visit Diagnoses: Rheumatoid arthritis involving multiple sites with positive rheumatoid factor ([place]): She has no synovitis on exam.  She has not had any recent rheumatoid arthritis flares.  She is clinically doing well on Plaquenil 200 mg 1 tablet by mouth BID M-F monotherapy.  She has intermittent pain in both hands and both knee joints, which is dependent on her level of activity and weather changes.  She has mild warmth of both knee joints but no effusion.  She requested cortisone injections in bilateral knee joints.  She will continue taking plaquenil as prescribed.  She does not need any refills at this  time.  She was advised to notify us if she develops increased joint pain or joint swelling.  She will follow up in 5 months.   High risk medication use - Plaquenil 200 mg 1 tablet twice daily Monday-Friday only.  Last Plaquenil eye exam normal on 06/24/2018.  She was given a plaquenil eye exam form to take with her to her upcoming appointment.  Most recent CBC/CMP within normal limits on 02/25/2019.  She will return for lab work in December and every 5 months to monitor for drug toxicity.    Fibromyalgia: She continues to have generalized muscle aches and muscle tenderness due to fibromyalgia.  She has trapezius muscle tension and tenderness bilaterally.  She takes Coenzyme Q-10 and Zanaflex 4 mg po TID prn for muscle spasms.  She continues taking trazodone 100 mg po at bedtime to help her sleep at night. Her level of fatigue has been stable.  She was encouraged to try to exercise on a regular basis.  She has been more active recently.    Primary osteoarthritis of both knees: She continues to have intermittent pain in both knee joints, especially with frequent weather changes.  She has good ROM of bilateral knee joints.  Warmth noted but no joint effusion.  No baker's cyst is palpable.  No pes anserine bursitis.  She does not have any mechanical symptoms.  She requested bilateral cortisone joint  injections.  She tolerated the procedure well. Procedure note completed above.  Aftercare discussed.  She was advised to monitor her blood pressure closely following the cortisone injection.   DDD (degenerative disc disease), cervical: She has good ROM with no discomfort.  No symptoms of radiculopathy   DDD (degenerative disc disease), thoracic:Thoracic kyphosis noted. No midline spinal tenderness.    DDD (degenerative disc disease), lumbar: No midline spinal tenderness.  She has no discomfort at this time.  She has no symptoms of radiculopathy.    Other osteoporosis without current pathological fracture: No DEXA on file.  According to the patient she was previous receiving IV reclast infusions, but her BMD was reduced on recent DEXA so she will be switching to Prolia sq injections every 6 months. She is taking a vitamin D supplement.   Other medical conditions are listed as follows:   History of anemia  History of gastroesophageal reflux (GERD)  History of bipolar disorder  History of hypothyroidism  History of suicide attempt  Orders: Orders Placed This Encounter  Procedures   Large Joint Inj   No orders of the defined types were placed in this encounter.   Face-to-face time spent with patient was 30 minutes. Greater than 50% of time was spent in counseling and coordination of care.  Follow-Up Instructions: Return in about 5 months (around 10/11/2019) for Rheumatoid arthritis, Osteoarthritis, Fibromyalgia, DDD.   Ofilia Neas, PA-C  Note - This record has been created using Dragon software.  Chart creation errors have been sought, but may not always  have been located. Such creation errors do not reflect on  the standard of medical care.

## 2019-05-12 ENCOUNTER — Other Ambulatory Visit: Payer: Self-pay

## 2019-05-13 ENCOUNTER — Other Ambulatory Visit: Payer: Self-pay

## 2019-05-13 ENCOUNTER — Inpatient Hospital Stay: Payer: Medicare HMO | Attending: Oncology

## 2019-05-13 ENCOUNTER — Encounter: Payer: Self-pay | Admitting: Physician Assistant

## 2019-05-13 ENCOUNTER — Ambulatory Visit: Payer: Medicare HMO | Admitting: Physician Assistant

## 2019-05-13 VITALS — BP 147/87 | HR 89 | Resp 16 | Ht 65.0 in | Wt 338.0 lb

## 2019-05-13 DIAGNOSIS — Z79899 Other long term (current) drug therapy: Secondary | ICD-10-CM | POA: Diagnosis not present

## 2019-05-13 DIAGNOSIS — Z862 Personal history of diseases of the blood and blood-forming organs and certain disorders involving the immune mechanism: Secondary | ICD-10-CM

## 2019-05-13 DIAGNOSIS — M818 Other osteoporosis without current pathological fracture: Secondary | ICD-10-CM

## 2019-05-13 DIAGNOSIS — G8929 Other chronic pain: Secondary | ICD-10-CM

## 2019-05-13 DIAGNOSIS — M5030 Other cervical disc degeneration, unspecified cervical region: Secondary | ICD-10-CM | POA: Diagnosis not present

## 2019-05-13 DIAGNOSIS — Z8659 Personal history of other mental and behavioral disorders: Secondary | ICD-10-CM

## 2019-05-13 DIAGNOSIS — M51369 Other intervertebral disc degeneration, lumbar region without mention of lumbar back pain or lower extremity pain: Secondary | ICD-10-CM

## 2019-05-13 DIAGNOSIS — M5134 Other intervertebral disc degeneration, thoracic region: Secondary | ICD-10-CM | POA: Diagnosis not present

## 2019-05-13 DIAGNOSIS — E538 Deficiency of other specified B group vitamins: Secondary | ICD-10-CM | POA: Insufficient documentation

## 2019-05-13 DIAGNOSIS — M797 Fibromyalgia: Secondary | ICD-10-CM

## 2019-05-13 DIAGNOSIS — M25561 Pain in right knee: Secondary | ICD-10-CM

## 2019-05-13 DIAGNOSIS — M0579 Rheumatoid arthritis with rheumatoid factor of multiple sites without organ or systems involvement: Secondary | ICD-10-CM | POA: Diagnosis not present

## 2019-05-13 DIAGNOSIS — M5136 Other intervertebral disc degeneration, lumbar region: Secondary | ICD-10-CM

## 2019-05-13 DIAGNOSIS — M25562 Pain in left knee: Secondary | ICD-10-CM

## 2019-05-13 DIAGNOSIS — M170 Bilateral primary osteoarthritis of knee: Secondary | ICD-10-CM | POA: Diagnosis not present

## 2019-05-13 DIAGNOSIS — Z8719 Personal history of other diseases of the digestive system: Secondary | ICD-10-CM | POA: Diagnosis not present

## 2019-05-13 DIAGNOSIS — Z9151 Personal history of suicidal behavior: Secondary | ICD-10-CM

## 2019-05-13 DIAGNOSIS — Z915 Personal history of self-harm: Secondary | ICD-10-CM

## 2019-05-13 DIAGNOSIS — Z8639 Personal history of other endocrine, nutritional and metabolic disease: Secondary | ICD-10-CM

## 2019-05-13 MED ORDER — TRIAMCINOLONE ACETONIDE 40 MG/ML IJ SUSP
40.0000 mg | INTRAMUSCULAR | Status: AC | PRN
  Administered 2019-05-13: 40 mg via INTRA_ARTICULAR

## 2019-05-13 MED ORDER — CYANOCOBALAMIN 1000 MCG/ML IJ SOLN
1000.0000 ug | Freq: Once | INTRAMUSCULAR | Status: AC
  Administered 2019-05-13: 1000 ug via INTRAMUSCULAR

## 2019-05-13 MED ORDER — LIDOCAINE HCL 1 % IJ SOLN
1.5000 mL | INTRAMUSCULAR | Status: AC | PRN
  Administered 2019-05-13: 1.5 mL

## 2019-05-13 NOTE — Patient Instructions (Signed)
Standing Labs We placed an order today for your standing lab work.    Please come back and get your standing labs in December and every 5 months   CBC and CMP   We have open lab daily Monday through Thursday from 8:30-12:30 PM and 1:30-4:30 PM and Friday from 8:30-12:30 PM and 1:30-4:00 PM at the office of Dr. Bo Merino.   You may experience shorter wait times on Monday and Friday afternoons. The office is located at [address], [place], [place], [place] [address] No appointment is necessary.   Labs are drawn by [facility].  You may receive a bill from [place] [facility] for your lab work.  If you wish to have your labs drawn at another location, please call the office 24 hours in advance to send orders.  If you have any questions regarding directions or hours of operation,  please call [phone].   Just as a reminder please drink plenty of water prior to coming for your lab work. Thanks!

## 2019-05-16 ENCOUNTER — Emergency Department: Payer: Medicare HMO

## 2019-05-16 ENCOUNTER — Other Ambulatory Visit: Payer: Self-pay

## 2019-05-16 ENCOUNTER — Emergency Department
Admission: EM | Admit: 2019-05-16 | Discharge: 2019-05-16 | Disposition: A | Discharge status: Home / Self Care | Payer: Medicare HMO | Attending: Emergency Medicine | Admitting: Emergency Medicine

## 2019-05-16 DIAGNOSIS — S0990XA Unspecified injury of head, initial encounter: Secondary | ICD-10-CM | POA: Diagnosis present

## 2019-05-16 DIAGNOSIS — S59902A Unspecified injury of left elbow, initial encounter: Secondary | ICD-10-CM | POA: Diagnosis not present

## 2019-05-16 DIAGNOSIS — Z7982 Long term (current) use of aspirin: Secondary | ICD-10-CM | POA: Insufficient documentation

## 2019-05-16 DIAGNOSIS — Y999 Unspecified external cause status: Secondary | ICD-10-CM | POA: Diagnosis not present

## 2019-05-16 DIAGNOSIS — Y92019 Unspecified place in single-family (private) house as the place of occurrence of the external cause: Secondary | ICD-10-CM | POA: Insufficient documentation

## 2019-05-16 DIAGNOSIS — E039 Hypothyroidism, unspecified: Secondary | ICD-10-CM | POA: Diagnosis not present

## 2019-05-16 DIAGNOSIS — W108XXA Fall (on) (from) other stairs and steps, initial encounter: Secondary | ICD-10-CM | POA: Diagnosis not present

## 2019-05-16 DIAGNOSIS — S060X0A Concussion without loss of consciousness, initial encounter: Secondary | ICD-10-CM | POA: Diagnosis not present

## 2019-05-16 DIAGNOSIS — Z79899 Other long term (current) drug therapy: Secondary | ICD-10-CM | POA: Insufficient documentation

## 2019-05-16 DIAGNOSIS — Y9301 Activity, walking, marching and hiking: Secondary | ICD-10-CM | POA: Insufficient documentation

## 2019-05-16 DIAGNOSIS — M25522 Pain in left elbow: Secondary | ICD-10-CM | POA: Diagnosis not present

## 2019-05-16 DIAGNOSIS — M25422 Effusion, left elbow: Secondary | ICD-10-CM

## 2019-05-16 DIAGNOSIS — S0003XA Contusion of scalp, initial encounter: Secondary | ICD-10-CM | POA: Diagnosis not present

## 2019-05-16 DIAGNOSIS — S199XXA Unspecified injury of neck, initial encounter: Secondary | ICD-10-CM | POA: Diagnosis not present

## 2019-05-16 MED ORDER — OXYCODONE-ACETAMINOPHEN 5-325 MG PO TABS
1.0000 | ORAL_TABLET | Freq: Once | ORAL | Status: AC
  Administered 2019-05-16: 1 via ORAL
  Filled 2019-05-16: qty 1

## 2019-05-16 MED ORDER — ONDANSETRON 4 MG PO TBDP
4.0000 mg | ORAL_TABLET | Freq: Once | ORAL | Status: AC
  Administered 2019-05-16: 4 mg via ORAL
  Filled 2019-05-16: qty 1

## 2019-05-16 MED ORDER — HYDROCODONE-ACETAMINOPHEN 5-325 MG PO TABS: 1.0000 | ORAL_TABLET | Freq: Four times a day (QID) | ORAL | 0 refills | Status: DC | PRN

## 2019-05-16 MED ORDER — ONDANSETRON 4 MG PO TBDP: 4.0000 mg | ORAL_TABLET | Freq: Three times a day (TID) | ORAL | 0 refills | Status: DC | PRN

## 2019-05-16 NOTE — Discharge Instructions (Signed)
Call your primary doctor for a repeat x-ray in a week to see if there was any fracture of your elbow.  For now, we are going to leave it in a splint.  Elevate it above the level of your heart.  You can put ice on it for the next 24 to 48 hours.  Take Tylenol as needed, and I have given a very low dose and short course of pain medication as needed.  Given your history, he should call your rheumatoid arthritis or primary doctor if you need more pain medication.  Otherwise, continue over-the-counter medications for pain.  Elevation will help the most.  I have also prescribed something for nausea for possible concussion.  Take it easy for the next 2 days, with minimal activity, then slowly return to normal activity.  If you develop a headache doing something, stop and rest.  Minimize screen time.

## 2019-05-16 NOTE — ED Provider Notes (Signed)
[facility] Emergency Department Provider Note  ____________________________________________   First MD Initiated Contact with Patient 05/16/19 1704     (approximate)  I have reviewed the triage vital signs and the nursing notes.   HISTORY  Chief Complaint Fall and Headache    HPI Jamie Bennett is a 58 y.o. female with extensive past medical history as below here with fall.  Patient states she was in her usual state of health this morning.  She was going to her grandson's house, when she tripped on the stairs.  She fell backward, striking her head.  She did not lose consciousness but was briefly confused.  She also states that she hit her left elbow.  She states that her primary issue is a moderate, aching, throbbing, posterior headache.  Is worse with palpation and movement.  She also complains of pain in her left elbow that is exquisite, and worse with movement.  No distal numbness or weakness.  She is on blood thinners.  No other complaints.        Past Medical History:  Diagnosis Date   Anemia    Anxiety    Bipolar 1 disorder ([place])    Collagen vascular disease ([place])    rhematoid arthritis   Constipation    Fibromyalgia    GERD (gastroesophageal reflux disease)    Heart murmur    mild-asymptomatic   Hepatic steatosis    History of Roux-en-Y gastric bypass    Hypotension    Hypothyroidism    Opioid abuse (HCC)    Osteoarthritis    Osteoporosis    PONV (postoperative nausea and vomiting)    nausea only   Rheumatic fever    Rheumatoid arthritis ([place])    Thyroid disease     Patient Active Problem List   Diagnosis Date Noted   Exercise-induced asthma 12/15/2018   Screening for breast cancer 05/29/2018   Duodenal ulcer disease 05/29/2018   Screening for malignant neoplasm of cervix 05/29/2018   Dysuria 05/29/2018   Surgical wound dehiscence, initial encounter 04/14/2018   Postoperative abdominal hernia  with obstruction    Incarcerated ventral hernia 03/22/2018   SBO (small bowel obstruction) ([place]) 03/07/2018   Abscess of abdominal wall 03/01/2018   Persistent umbilical sinus [id number]   Tinea pedis of both feet 01/23/2018   Atopic dermatitis 12/05/2017   Adjustment disorder with mixed anxiety and depressed mood 03/10/2017   Major depressive disorder 03/10/2017   Primary osteoarthritis of both knees 01/19/2017   Suicide attempt ([place]) 07/10/2016   Fibromyalgia 07/10/2016   High risk medication use 07/10/2016   AKI (acute kidney injury) ([place]) 11/09/2015   Elevated troponin 11/09/2015   Hypotension 11/09/2015   Respiratory failure ([place])    Acute hepatic failure 11/08/2015   Drug overdose 11/08/2015   Fatty infiltration of liver 02/16/2015   Hepatic fibrosis 02/16/2015   Abnormal serum level of alkaline phosphatase 02/15/2015   Iron deficiency anemia 02/05/2015   Vitamin B 12 deficiency 02/05/2015   OP (osteoporosis) 06/16/2014   Rheumatoid arteritis ([place]) 03/02/2014   Hypothyroidism 03/02/2014   Rheumatic fever without heart involvement 03/02/2014   Adult hypothyroidism 03/02/2014   Arthritis of pelvic region, degenerative 03/02/2014   Bipolar 1 disorder, depressed ([place]) 02/26/2014   Bariatric surgery status 11/24/2013   Affective bipolar disorder ([place]) 11/24/2013   Bipolar affective disorder ([place]) 11/24/2013   Rheumatoid arthritis ([place]) 11/24/2013   Polysubstance (excluding opioids) dependence ([place]) 09/11/2013   Polysubstance dependence ([place]) 09/11/2013   Combined drug  dependence excluding opioids ([place]) 09/11/2013   Arthritis or polyarthritis, rheumatoid ([place]) 09/05/2013   Leg weakness 09/05/2013    Past Surgical History:  Procedure Laterality Date   CHOLECYSTECTOMY  2004   COLONOSCOPY N/A 07/16/2017   Procedure: COLONOSCOPY;  Surgeon: Manya Silvas, MD;  Location: [facility] ENDOSCOPY;  Service: Endoscopy;  Laterality: N/A;    EXPLORATORY LAPAROTOMY  2009   GASTRIC BYPASS  2003   [place] N/A 03/01/2018   Procedure: EXPLORATORY LAPAROTOMY/ UMBILECTOMY;  Surgeon: Robert Bellow, MD;  Location: [facility] ORS;  Service: General;  Laterality: N/A;   TONSILLECTOMY     VENTRAL HERNIA REPAIR N/A 03/07/2018   Procedure: HERNIA REPAIR VENTRAL ADULT;  Surgeon: Robert Bellow, MD;  Location: [facility] ORS;  Service: General;  Laterality: N/A;    Prior to Admission medications   Medication Sig Start Date End Date Taking? Authorizing Provider  albuterol (VENTOLIN HFA) 108 (90 Base) MCG/ACT inhaler Inhale 2 puffs into the lungs every 6 (six) hours as needed for wheezing or shortness of breath. 11/26/18   Ronnell Freshwater, NP  aspirin EC 81 MG tablet Take 81 mg by mouth daily.    [provider]  busPIRone (BUSPAR) 30 MG tablet 1 bid 02/19/19   Plovsky, Berneta Sages, MD  Cholecalciferol (HM VITAMIN D3) 4000 units CAPS Take 4,000 Units by mouth daily.     [provider]  clonazePAM (KLONOPIN) 0.5 MG tablet Take one tablet by mouth for acute anxiety, EMERGENCIES only - do not exceed 1 tab daily 11/25/18   Plovsky, Berneta Sages, MD  Coenzyme Q-10 200 MG CAPS Take 200 mg by mouth 2 (two) times daily.     [provider]  cyanocobalamin (,VITAMIN B-12,) 1000 MCG/ML injection Inject 1,000 mcg into the muscle every 30 (thirty) days.    [provider]  desipramine (NORPRAMIN) 25 MG tablet 1  qam 02/19/19   Plovsky, Berneta Sages, MD  esomeprazole (NEXIUM) 20 MG capsule Take 20 mg by mouth daily.     [provider]  Fiber, Guar Gum, CHEW Chew 5 mg by mouth daily.     [provider]  HYDROcodone-acetaminophen (NORCO/VICODIN) 5-325 MG tablet Take 1 tablet by mouth every 6 (six) hours as needed for severe pain. 05/16/19 05/15/20  Duffy Bruce, MD  hydroxychloroquine (PLAQUENIL) 200 MG tablet Take one tablet by mouth twice a day Monday- Friday 02/26/19   Bo Merino, MD    hydrOXYzine (ATARAX/VISTARIL) 50 MG tablet Take 1 tablet (50 mg total) by mouth 3 (three) times daily as needed for anxiety. 02/19/19   Plovsky, Berneta Sages, MD  ibuprofen (ADVIL,MOTRIN) 100 MG tablet Take 100 mg by mouth as needed for fever.    [provider]  Javier Docker Oil 500 MG CAPS Take 500 mg by mouth daily.    [provider]  lamoTRIgine (LAMICTAL) 150 MG tablet Take 1 tablet (150 mg total) by mouth 2 (two) times daily. 02/19/19   Plovsky, Berneta Sages, MD  levothyroxine (SYNTHROID) 112 MCG tablet Take 1 tablet (112 mcg total) by mouth daily. 02/03/19   Ronnell Freshwater, NP  loratadine (CLARITIN) 10 MG tablet Take 10 mg by mouth daily as needed for allergies.    [provider]  ondansetron (ZOFRAN ODT) 4 MG disintegrating tablet Take 1 tablet (4 mg total) by mouth every 8 (eight) hours as needed for nausea or vomiting. 05/16/19   Duffy Bruce, MD  polyethylene glycol [facility]) packet Take 17 g by mouth daily. 02/10/18   Dahlia Client,  Eduard Roux, MD  sucralfate (CARAFATE) 1 g tablet Take 1 tablet (1 g total) by mouth 2 (two) times daily. 05/27/18 05/27/19  Ronnell Freshwater, NP  tiZANidine (ZANAFLEX) 4 MG tablet Take 1 tablet (4 mg total) by mouth 3 (three) times daily as needed. 03/13/19   Ronnell Freshwater, NP  traZODone (DESYREL) 100 MG tablet Take 1 tablet (100 mg total) by mouth at bedtime as needed and may repeat dose one time if needed for sleep. 07/17/18   Norma Fredrickson, MD  venlafaxine (EFFEXOR) 100 MG tablet Take 1 tablet (100 mg total) by mouth 3 (three) times daily with meals. 02/19/19 02/19/20  Plovsky, Berneta Sages, MD  zoledronic acid (RECLAST) 5 MG/100ML SOLN injection Inject 5 mg into the vein See admin instructions. Patient takes yearly    [provider]    Allergies Lactose intolerance (gi) and Sulfa antibiotics  Family History  Problem Relation Age of Onset   Depression Mother    Dementia Mother    Heart disease Mother    Osteoporosis Mother     Parkinson's disease Father    Colon cancer Neg Hx     Social History Social History   Tobacco Use   Smoking status: Never Smoker   Smokeless tobacco: Never Used  Substance Use Topics   Alcohol use: No    Alcohol/week: 0.0 standard drinks   Drug use: No    Review of Systems  Review of Systems  Constitutional: Negative for fatigue and fever.  HENT: Negative for congestion and sore throat.   Eyes: Negative for visual disturbance.  Respiratory: Negative for cough and shortness of breath.   Cardiovascular: Negative for chest pain.  Gastrointestinal: Negative for abdominal pain, diarrhea, nausea and vomiting.  Genitourinary: Negative for flank pain.  Musculoskeletal: Positive for arthralgias and myalgias. Negative for back pain and neck pain.  Skin: Negative for rash and wound.  Neurological: Positive for weakness and headaches.     ____________________________________________  PHYSICAL EXAM:      VITAL SIGNS: ED Triage Vitals  Enc Vitals Group     BP 05/16/19 1627 131/60     Pulse Rate 05/16/19 1627 73     Resp 05/16/19 1627 16     Temp 05/16/19 1627 98.2 F (36.8 C)     Temp Source 05/16/19 1627 Oral     SpO2 05/16/19 1627 99 %     Weight 05/16/19 1629 (!) 340 lb (154.2 kg)     Height 05/16/19 1629 5\' 5"  (1.651 m)     Head Circumference --      Peak Flow --      Pain Score 05/16/19 1628 7     Pain Loc --      Pain Edu? --      Excl. in [place]? --      Physical Exam Vitals signs and nursing note reviewed.  Constitutional:      General: She is not in acute distress.    Appearance: She is well-developed.  HENT:     Head: Normocephalic. Contusion present.     Comments: Moderate tender hematoma to the posterior occiput.  No postauricular ecchymoses.  No periorbital ecchymoses.  No deformity.  Mild paraspinal tenderness.  No midline neck tenderness. Eyes:     Conjunctiva/sclera: Conjunctivae normal.  Neck:     Musculoskeletal: Neck supple.  Cardiovascular:      Rate and Rhythm: Normal rate and regular rhythm.     Heart sounds: Normal heart sounds. No murmur. No friction  rub.  Pulmonary:     Effort: Pulmonary effort is normal. No respiratory distress.     Breath sounds: Normal breath sounds. No wheezing or rales.  Abdominal:     General: There is no distension.     Palpations: Abdomen is soft.     Tenderness: There is no abdominal tenderness.  Musculoskeletal:     Comments: Moderate effusion appreciated on exam of the left elbow.  Significant tenderness with flexion, extension, and pronation.  No obvious deformity.  No open wounds.  Distal strength and sensation intact.  Skin:    General: Skin is warm.     Capillary Refill: Capillary refill takes less than 2 seconds.  Neurological:     Mental Status: She is alert and oriented to person, place, and time.     Motor: No abnormal muscle tone.       ____________________________________________   LABS (all labs ordered are listed, but only abnormal results are displayed)  Labs Reviewed - No data to display  ____________________________________________  EKG: None ________________________________________  RADIOLOGY All imaging, including plain films, CT scans, and ultrasounds, independently reviewed by me, and interpretations confirmed via formal radiology reads.  ED MD interpretation:   CT head: Negative CT C-spine: Negative, no fracture or subluxation X-ray elbow: Hemarthrosis noted, no fracture  Official radiology report(s): Dg Elbow Complete Left  Result Date: 05/16/2019 CLINICAL DATA:  Left elbow pain after fall EXAM: LEFT ELBOW - COMPLETE 3+ VIEW COMPARISON:  None. FINDINGS: There is no evidence of fracture or dislocation. There is suggestion of elbow joint effusion, although lateral view is slightly suboptimal. There is no evidence of arthropathy or other focal bone abnormality. Soft tissues are unremarkable. IMPRESSION: Probable elbow joint effusion, which is suggestive of  hemarthrosis in the setting of trauma. No acute fracture is seen. Immobilization followed by repeat radiographs in 5-7 days can be performed to assess for a healing fracture. Electronically Signed   By: Davina Poke M.D.   On: 05/16/2019 17:01   Ct Head Wo Contrast  Result Date: 05/16/2019 CLINICAL DATA:  Fall down steps, head injury EXAM: CT HEAD WITHOUT CONTRAST CT CERVICAL SPINE WITHOUT CONTRAST TECHNIQUE: Multidetector CT imaging of the head and cervical spine was performed following the standard protocol without intravenous contrast. Multiplanar CT image reconstructions of the cervical spine were also generated. COMPARISON:  None. FINDINGS: CT HEAD FINDINGS Brain: No evidence of acute infarction, hemorrhage, hydrocephalus, extra-axial collection or mass lesion/mass effect. Vascular: No hyperdense vessel or unexpected calcification. Skull: Normal. Negative for fracture or focal lesion. Sinuses/Orbits: No acute finding. Other: Large soft tissue hematoma of the left occipital scalp. CT CERVICAL SPINE FINDINGS Alignment: Normal. Skull base and vertebrae: No acute fracture. No primary bone lesion or focal pathologic process. Soft tissues and spinal canal: No prevertebral fluid or swelling. No visible canal hematoma. Disc levels:  Intact. Upper chest: Negative. Other: None. IMPRESSION: 1.  No acute intracranial pathology. 2.  Large soft tissue hematoma of the left occipital scalp. 3.  No fracture or static subluxation of the cervical spine. Electronically Signed   By: Eddie Candle M.D.   On: 05/16/2019 16:58   Ct Cervical Spine Wo Contrast  Result Date: 05/16/2019 CLINICAL DATA:  Fall down steps, head injury EXAM: CT HEAD WITHOUT CONTRAST CT CERVICAL SPINE WITHOUT CONTRAST TECHNIQUE: Multidetector CT imaging of the head and cervical spine was performed following the standard protocol without intravenous contrast. Multiplanar CT image reconstructions of the cervical spine were also generated. COMPARISON:   None.  FINDINGS: CT HEAD FINDINGS Brain: No evidence of acute infarction, hemorrhage, hydrocephalus, extra-axial collection or mass lesion/mass effect. Vascular: No hyperdense vessel or unexpected calcification. Skull: Normal. Negative for fracture or focal lesion. Sinuses/Orbits: No acute finding. Other: Large soft tissue hematoma of the left occipital scalp. CT CERVICAL SPINE FINDINGS Alignment: Normal. Skull base and vertebrae: No acute fracture. No primary bone lesion or focal pathologic process. Soft tissues and spinal canal: No prevertebral fluid or swelling. No visible canal hematoma. Disc levels:  Intact. Upper chest: Negative. Other: None. IMPRESSION: 1.  No acute intracranial pathology. 2.  Large soft tissue hematoma of the left occipital scalp. 3.  No fracture or static subluxation of the cervical spine. Electronically Signed   By: Eddie Candle M.D.   On: 05/16/2019 16:58    ____________________________________________  PROCEDURES   Procedure(s) performed (including Critical Care):  Procedures  ____________________________________________  INITIAL IMPRESSION / MDM / [place] / ED COURSE  As part of my medical decision making, I reviewed the following data within the [place] was evaluated in Emergency Department on 05/16/2019 for the symptoms described in the history of present illness. She was evaluated in the context of the global COVID-19 pandemic, which necessitated consideration that the patient might be at risk for infection with the SARS-CoV-2 virus that causes COVID-19. Institutional protocols and algorithms that pertain to the evaluation of patients at risk for COVID-19 are in a state of rapid change based on information released by regulatory bodies including the CDC and federal and state organizations. These policies and algorithms were followed during the patient's care in the ED.  Some ED evaluations and  interventions may be delayed as a result of limited staffing during the pandemic.*      Medical Decision Making: 58 year old female here with posterior headache, left elbow pain.  Imaging shows no fracture or abnormality on head or C-spine.  Left elbow shows mild to moderate effusion, no fracture.  Given her tenderness on exam, will place in a splint and repeat x-ray in a week.  Otherwise, patient is at her baseline state of health.  There was no syncope.  Fall was nonsyncopal mechanical in nature.  She may have a mild concussion but has no focal neurological deficits, is not on blood thinners.  Will have her hold her aspirin for a day, give brief course of analgesics given her complex history, and discharged with close orthopedic and PCP follow-up.  ____________________________________________  FINAL CLINICAL IMPRESSION(S) / ED DIAGNOSES  Final diagnoses:  Concussion without loss of consciousness, initial encounter  Effusion, left elbow     MEDICATIONS GIVEN DURING THIS VISIT:  Medications  oxyCODONE-acetaminophen (PERCOCET/ROXICET) 5-325 MG per tablet 1 tablet (1 tablet Oral Given 05/16/19 1803)  ondansetron (ZOFRAN-ODT) disintegrating tablet 4 mg (4 mg Oral Given 05/16/19 1803)     ED Discharge Orders         Ordered    HYDROcodone-acetaminophen (NORCO/VICODIN) 5-325 MG tablet  Every 6 hours PRN     05/16/19 1755    ondansetron (ZOFRAN ODT) 4 MG disintegrating tablet  Every 8 hours PRN     05/16/19 1800           Note:  This document was prepared using Dragon voice recognition software and may include unintentional dictation errors.   Duffy Bruce, MD 05/16/19 [phone]

## 2019-05-16 NOTE — ED Notes (Signed)
Dicussed pt with [name], [place]. Verbal order for CT of head and neck w/o contrast and left elbow x-ray. Flex appropriate if negative.

## 2019-05-16 NOTE — ED Triage Notes (Signed)
Pt states she fell backward down four steps this morning and hit her head and left elbow. Pt denies Loc. Pt reports dizziness.  Pt takes baby aspirin.

## 2019-05-18 IMAGING — CR DG CHEST 2V
1 series · 2 of 2 positions shown · non-contrast
Comparison: 11/13/2015

CLINICAL DATA: Pre ECT.

EXAM:
CHEST  2 VIEW

[Series 1: dg chest 2 view · 0.14mm/px · 2 of 2 slices shown]
[im 1/2]
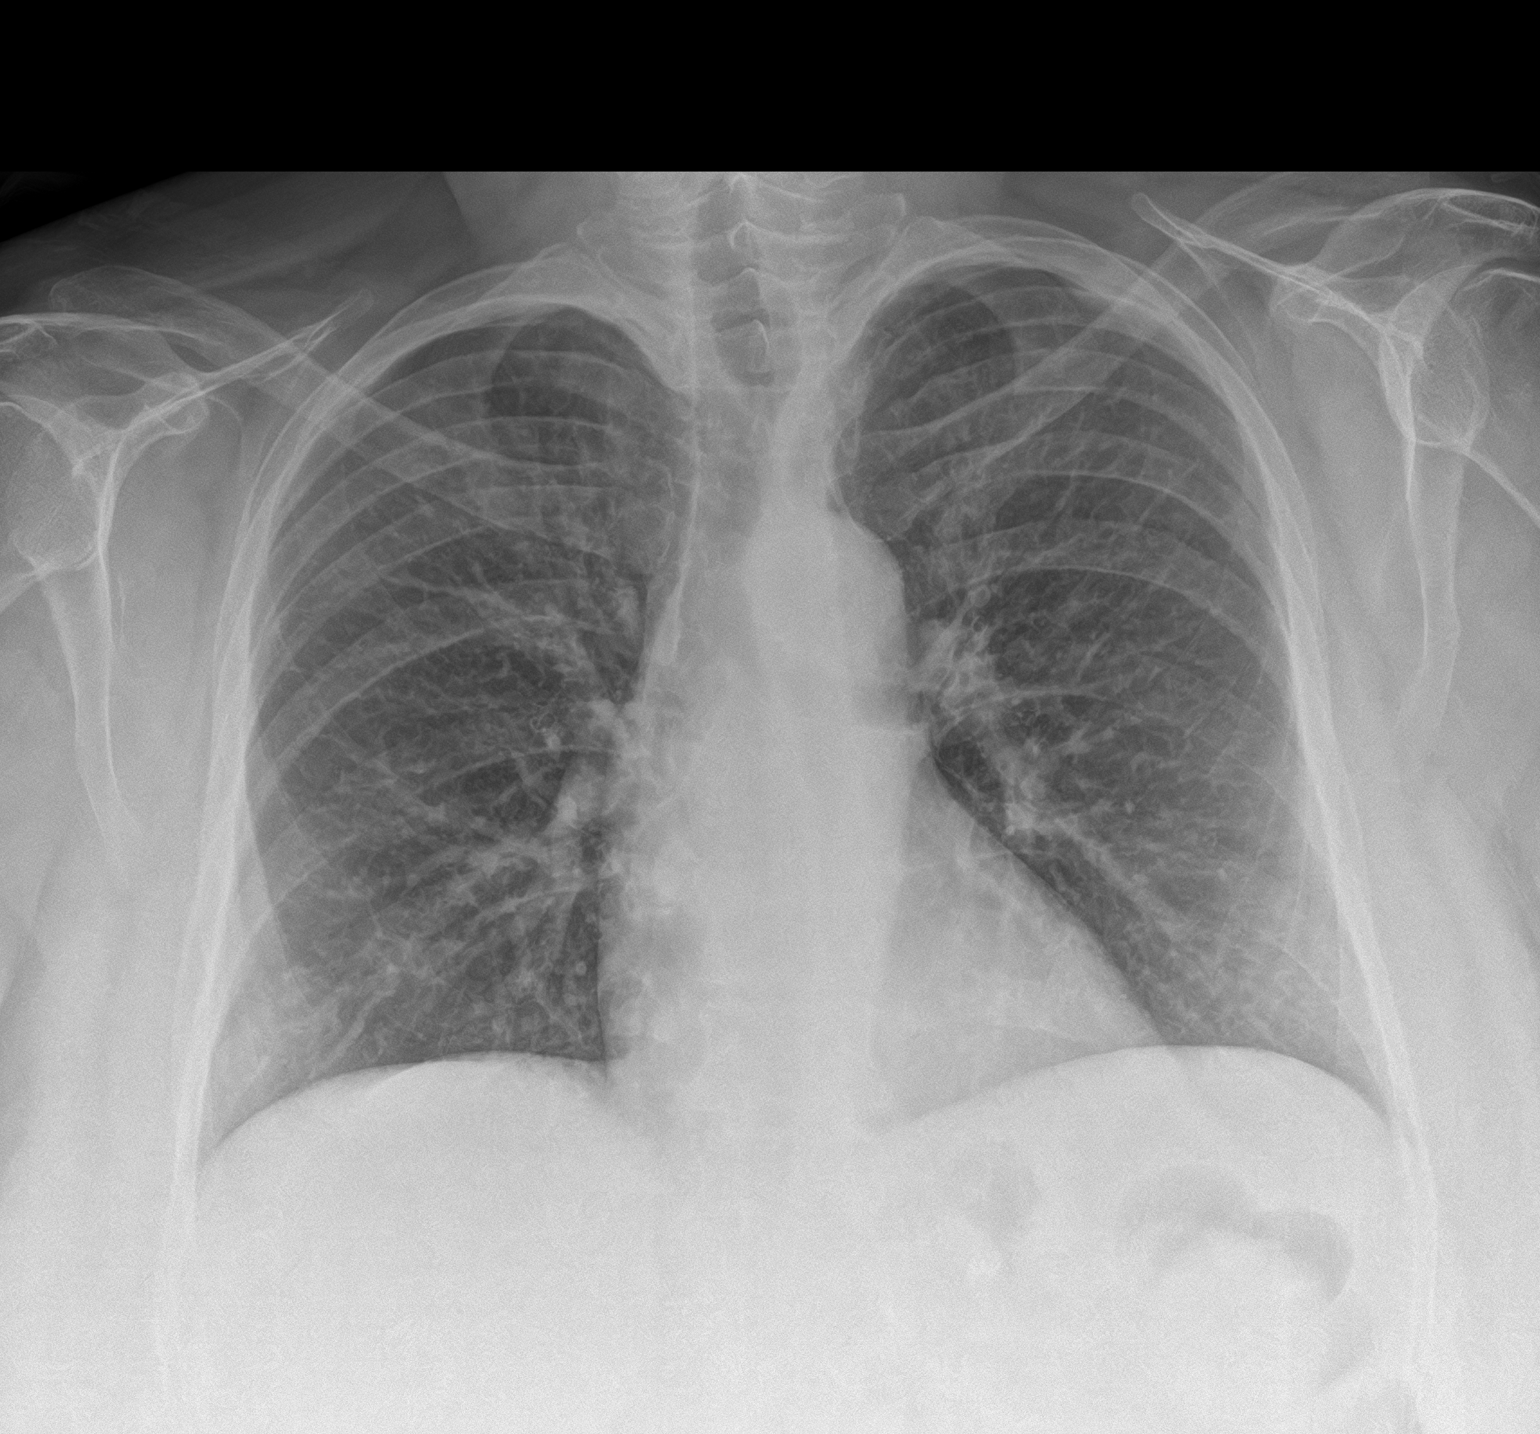
[im 2/2]
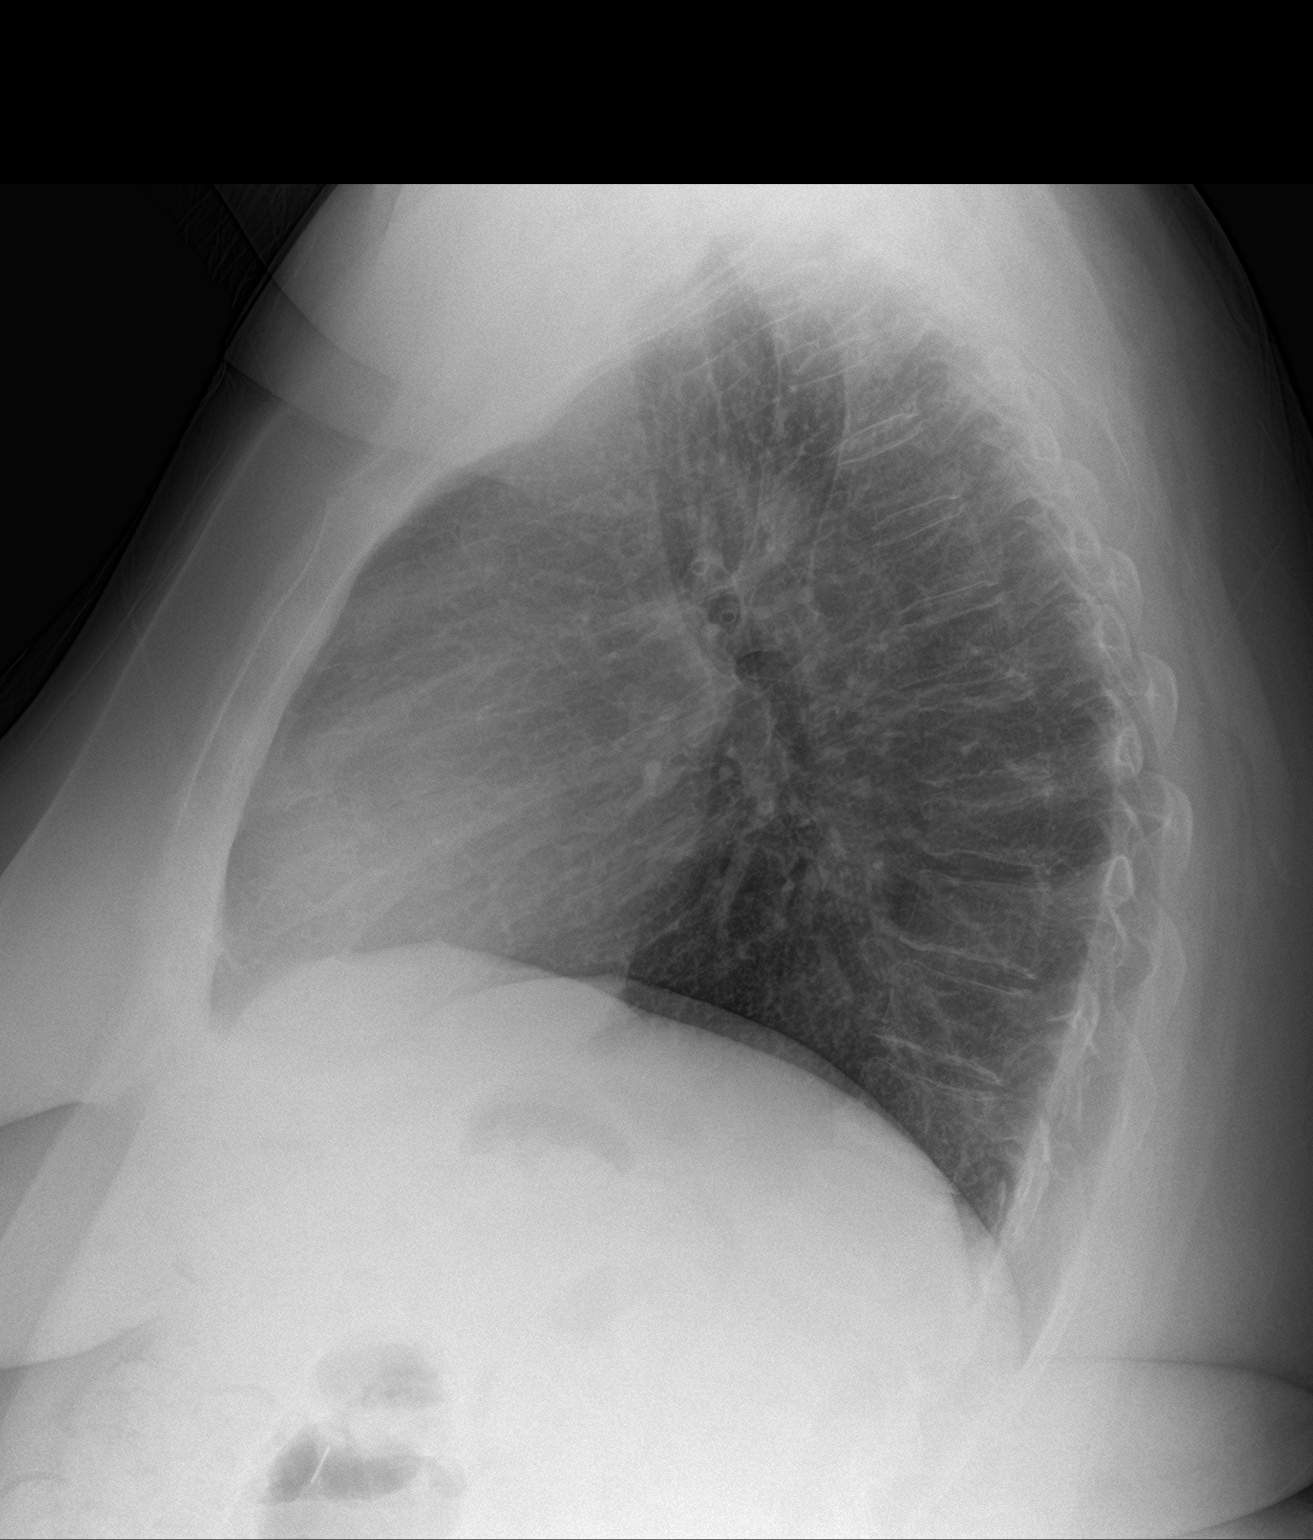

[2 of 2 positions shown; findings below may reference images not displayed]

FINDINGS: The cardiomediastinal silhouette is within normal limits. Left
jugular catheter on the prior study has been removed. There is mild
diffuse interstitial coarsening which appears chronic. No acute
airspace consolidation, overt pulmonary edema, pleural effusion, or
pneumothorax is identified. A chronic midthoracic scratched at a
midthoracic compression fractures chronic, present on 08/19/2013
thoracic spine MRI.
IMPRESSION: No active cardiopulmonary disease.

## 2019-05-19 ENCOUNTER — Ambulatory Visit (INDEPENDENT_AMBULATORY_CARE_PROVIDER_SITE_OTHER): Payer: Medicare HMO | Admitting: Adult Health

## 2019-05-19 ENCOUNTER — Other Ambulatory Visit: Payer: Self-pay

## 2019-05-19 ENCOUNTER — Encounter: Payer: Self-pay | Admitting: Adult Health

## 2019-05-19 ENCOUNTER — Other Ambulatory Visit: Payer: Self-pay | Admitting: Nurse Practitioner

## 2019-05-19 VITALS — BP 135/76 | HR 70 | Temp 97.3°F | Resp 16 | Ht 65.0 in | Wt 342.0 lb

## 2019-05-19 DIAGNOSIS — M79622 Pain in left upper arm: Secondary | ICD-10-CM | POA: Diagnosis not present

## 2019-05-19 MED ORDER — LEVOTHYROXINE SODIUM 112 MCG PO TABS: 112.0000 ug | ORAL_TABLET | Freq: Every day | ORAL | 0 refills | Status: DC

## 2019-05-19 MED ORDER — HYDROCODONE-ACETAMINOPHEN 5-325 MG PO TABS: 1.0000 | ORAL_TABLET | Freq: Four times a day (QID) | ORAL | 0 refills | Status: DC | PRN

## 2019-05-19 NOTE — Progress Notes (Signed)
[facility] [place], [place] [address]  Internal MEDICINE  Office Visit Note  Patient Name: Jamie Bennett  [id number]  [id number]  Date of Service: 05/19/2019  Chief Complaint  Patient presents with  . Medical Management of Chronic Issues    pt fall friday and hits her left arm   . Arm Pain    swollen and painful     HPI Pt is here for a sick visit. Pt reports on Friday she had a fall at her sons house.  She hit her head/neck and left elbow.  She was seen in the ER where her head and neck were cleared.  They splinted her left arm, and told her to follow up in a week because the swelling was to great to see if there is a fracture.  She is mainly here because the ER gave her 8 pain pills to make it through the weekend.  She would like a refill at this time.    Current Medication:  Outpatient Encounter Medications as of 05/19/2019  Medication Sig  . albuterol (VENTOLIN HFA) 108 (90 Base) MCG/ACT inhaler Inhale 2 puffs into the lungs every 6 (six) hours as needed for wheezing or shortness of breath.  Marland Kitchen aspirin EC 81 MG tablet Take 81 mg by mouth daily.  . busPIRone (BUSPAR) 30 MG tablet 1 bid  . Cholecalciferol (HM VITAMIN D3) 4000 units CAPS Take 4,000 Units by mouth daily.   . clonazePAM (KLONOPIN) 0.5 MG tablet Take one tablet by mouth for acute anxiety, EMERGENCIES only - do not exceed 1 tab daily  . Coenzyme Q-10 200 MG CAPS Take 200 mg by mouth 2 (two) times daily.   . cyanocobalamin (,VITAMIN B-12,) 1000 MCG/ML injection Inject 1,000 mcg into the muscle every 30 (thirty) days.  Marland Kitchen desipramine (NORPRAMIN) 25 MG tablet 1  qam  . esomeprazole (NEXIUM) 20 MG capsule Take 20 mg by mouth daily.   . Fiber, Guar Gum, CHEW Chew 5 mg by mouth daily.   Marland Kitchen HYDROcodone-acetaminophen (NORCO/VICODIN) 5-325 MG tablet Take 1-2 tablets by mouth every 6 (six) hours as needed for severe pain.  . hydroxychloroquine (PLAQUENIL) 200 MG tablet Take one tablet by mouth  twice a day Monday- Friday  . hydrOXYzine (ATARAX/VISTARIL) 50 MG tablet Take 1 tablet (50 mg total) by mouth 3 (three) times daily as needed for anxiety.  Marland Kitchen ibuprofen (ADVIL,MOTRIN) 100 MG tablet Take 100 mg by mouth as needed for fever.  Javier Docker Oil 500 MG CAPS Take 500 mg by mouth daily.  Marland Kitchen lamoTRIgine (LAMICTAL) 150 MG tablet Take 1 tablet (150 mg total) by mouth 2 (two) times daily.  Marland Kitchen levothyroxine (SYNTHROID) 112 MCG tablet Take 1 tablet (112 mcg total) by mouth daily.  Marland Kitchen loratadine (CLARITIN) 10 MG tablet Take 10 mg by mouth daily as needed for allergies.  Marland Kitchen ondansetron (ZOFRAN ODT) 4 MG disintegrating tablet Take 1 tablet (4 mg total) by mouth every 8 (eight) hours as needed for nausea or vomiting.  . polyethylene glycol (MIRALAX) packet Take 17 g by mouth daily.  . sucralfate (CARAFATE) 1 g tablet Take 1 tablet (1 g total) by mouth 2 (two) times daily.  Marland Kitchen tiZANidine (ZANAFLEX) 4 MG tablet Take 1 tablet (4 mg total) by mouth 3 (three) times daily as needed.  . traZODone (DESYREL) 100 MG tablet Take 1 tablet (100 mg total) by mouth at bedtime as needed and may repeat dose one time if needed for sleep.  Marland Kitchen  venlafaxine (EFFEXOR) 100 MG tablet Take 1 tablet (100 mg total) by mouth 3 (three) times daily with meals.  . zoledronic acid (RECLAST) 5 MG/100ML SOLN injection Inject 5 mg into the vein See admin instructions. Patient takes yearly  . [DISCONTINUED] HYDROcodone-acetaminophen (NORCO/VICODIN) 5-325 MG tablet Take 1 tablet by mouth every 6 (six) hours as needed for severe pain.   No facility-administered encounter medications on file as of 05/19/2019.       Medical History: Past Medical History:  Diagnosis Date  . Anemia   . Anxiety   . Bipolar 1 disorder ([place])   . Collagen vascular disease ([place])    rhematoid arthritis  . Constipation   . Fibromyalgia   . GERD (gastroesophageal reflux disease)   . Heart murmur    mild-asymptomatic  . Hepatic steatosis   . History of  Roux-en-Y gastric bypass   . Hypotension   . Hypothyroidism   . Opioid abuse ([place])   . Osteoarthritis   . Osteoporosis   . PONV (postoperative nausea and vomiting)    nausea only  . Rheumatic fever   . Rheumatoid arthritis ([place])   . Thyroid disease      Vital Signs: BP 135/76   Pulse 70   Temp (!) 97.3 F (36.3 C)   Resp 16   Ht 5\' 5"  (1.651 m)   Wt (!) 342 lb (155.1 kg)   SpO2 100%   BMI 56.91 kg/m    Review of Systems  Constitutional: Negative for chills, fatigue and unexpected weight change.  HENT: Negative for congestion, rhinorrhea, sneezing and sore throat.   Eyes: Negative for photophobia, pain and redness.  Respiratory: Negative for cough, chest tightness and shortness of breath.   Cardiovascular: Negative for chest pain and palpitations.  Gastrointestinal: Negative for abdominal pain, constipation, diarrhea, nausea and vomiting.  Endocrine: Negative.   Genitourinary: Negative for dysuria and frequency.  Musculoskeletal: Negative for arthralgias, back pain, joint swelling and neck pain.       Left arm pain.   Skin: Negative for rash.  Allergic/Immunologic: Negative.   Neurological: Negative for tremors and numbness.  Hematological: Negative for adenopathy. Does not bruise/bleed easily.  Psychiatric/Behavioral: Negative for behavioral problems and sleep disturbance. The patient is not nervous/anxious.     Physical Exam Vitals signs and nursing note reviewed.  Constitutional:      General: She is not in acute distress.    Appearance: She is well-developed. She is not diaphoretic.  HENT:     Head: Normocephalic and atraumatic.     Mouth/Throat:     Pharynx: No oropharyngeal exudate.  Eyes:     Pupils: Pupils are equal, round, and reactive to light.  Neck:     Musculoskeletal: Normal range of motion and neck supple.     Thyroid: No thyromegaly.     Vascular: No JVD.     Trachea: No tracheal deviation.  Cardiovascular:     Rate and Rhythm: Normal rate  and regular rhythm.     Heart sounds: Normal heart sounds. No murmur. No friction rub. No gallop.   Pulmonary:     Effort: Pulmonary effort is normal. No respiratory distress.     Breath sounds: Normal breath sounds. No wheezing or rales.  Chest:     Chest wall: No tenderness.  Abdominal:     Palpations: Abdomen is soft.     Tenderness: There is no abdominal tenderness. There is no guarding.  Musculoskeletal: Normal range of motion.  Lymphadenopathy:  Cervical: No cervical adenopathy.  Skin:    General: Skin is warm and dry.  Neurological:     Mental Status: She is alert and oriented to person, place, and time.     Cranial Nerves: No cranial nerve deficit.  Psychiatric:        Behavior: Behavior normal.        Thought Content: Thought content normal.        Judgment: Judgment normal.    Assessment/Plan: 1. Left upper arm pain Reviewed risks and possible side effects associated with taking opiates, benzodiazepines and other CNS depressants. Combination of these could cause dizziness and drowsiness. Advised patient not to drive or operate machinery when taking these medications, as patient's and other's life can be at risk and will have consequences. Patient verbalized understanding in this matter. Dependence and abuse for these drugs will be monitored closely. A Controlled substance policy and procedure is on file which allows [facility] to order a urine drug screen test at any visit. Patient understands and agrees with the plan - HYDROcodone-acetaminophen (NORCO/VICODIN) 5-325 MG tablet; Take 1-2 tablets by mouth every 6 (six) hours as needed for severe pain.  Dispense: 20 tablet; Refill: 0 - DG Elbow Complete Left; Future  General Counseling: Jamie Bennett understanding of the findings of todays visit and agrees with plan of treatment. I have discussed any further diagnostic evaluation that may be needed or ordered today. We also reviewed her medications today. she  has been encouraged to call the office with any questions or concerns that should arise related to todays visit.   Orders Placed This Encounter  Procedures  . DG Elbow Complete Left    Meds ordered this encounter  Medications  . HYDROcodone-acetaminophen (NORCO/VICODIN) 5-325 MG tablet    Sig: Take 1-2 tablets by mouth every 6 (six) hours as needed for severe pain.    Dispense:  20 tablet    Refill:  0    Time spent: 20 Minutes  This patient was seen by Orson Gear AGNP-C in Collaboration with Dr Lavera Guise as a part of collaborative care agreement.  Kendell Bane AGNP-C Internal Medicine

## 2019-05-20 DIAGNOSIS — F4010 Social phobia, unspecified: Secondary | ICD-10-CM | POA: Diagnosis not present

## 2019-05-20 DIAGNOSIS — F603 Borderline personality disorder: Secondary | ICD-10-CM | POA: Diagnosis not present

## 2019-05-20 DIAGNOSIS — R69 Illness, unspecified: Secondary | ICD-10-CM | POA: Diagnosis not present

## 2019-05-21 ENCOUNTER — Ambulatory Visit (INDEPENDENT_AMBULATORY_CARE_PROVIDER_SITE_OTHER): Payer: Medicare HMO | Admitting: Psychiatry

## 2019-05-21 ENCOUNTER — Other Ambulatory Visit: Payer: Self-pay

## 2019-05-21 DIAGNOSIS — R69 Illness, unspecified: Secondary | ICD-10-CM | POA: Diagnosis not present

## 2019-05-21 DIAGNOSIS — F603 Borderline personality disorder: Secondary | ICD-10-CM

## 2019-05-21 DIAGNOSIS — F332 Major depressive disorder, recurrent severe without psychotic features: Secondary | ICD-10-CM | POA: Diagnosis not present

## 2019-05-21 DIAGNOSIS — F320 Major depressive disorder, single episode, mild: Secondary | ICD-10-CM

## 2019-05-21 DIAGNOSIS — F411 Generalized anxiety disorder: Secondary | ICD-10-CM | POA: Diagnosis not present

## 2019-05-21 MED ORDER — VENLAFAXINE HCL 100 MG PO TABS: 100.0000 mg | ORAL_TABLET | Freq: Three times a day (TID) | ORAL | 4 refills | Status: DC

## 2019-05-21 MED ORDER — HYDROXYZINE HCL 50 MG PO TABS: 50.0000 mg | ORAL_TABLET | Freq: Three times a day (TID) | ORAL | 1 refills | Status: DC | PRN

## 2019-05-21 MED ORDER — TRAZODONE HCL 100 MG PO TABS: 100.0000 mg | ORAL_TABLET | Freq: Every evening | ORAL | 5 refills | Status: DC | PRN

## 2019-05-21 MED ORDER — BUSPIRONE HCL 30 MG PO TABS: ORAL_TABLET | ORAL | 1 refills | Status: DC

## 2019-05-21 MED ORDER — LAMOTRIGINE 150 MG PO TABS: 150.0000 mg | ORAL_TABLET | Freq: Two times a day (BID) | ORAL | 1 refills | Status: DC

## 2019-05-21 MED ORDER — DESIPRAMINE HCL 25 MG PO TABS: ORAL_TABLET | ORAL | 3 refills | Status: DC

## 2019-05-21 NOTE — Progress Notes (Signed)
`  Psychiatric Initial Adult Assessment   Patient Identification: Jamie Bennett MRN:  [id number] Date of Evaluation:  05/21/2019 Referral Source: grams per previous psychiatrist Chief Complaint:   Visit Diagnosis: borderline personality disorder   ICD-10-CM   1. Anxiety state  F41.1 busPIRone (BUSPAR) 30 MG tablet    venlafaxine (EFFEXOR) 100 MG tablet    hydrOXYzine (ATARAX/VISTARIL) 50 MG tablet  2. Borderline personality disorder ([place])  F60.3 venlafaxine (EFFEXOR) 100 MG tablet    lamoTRIgine (LAMICTAL) 150 MG tablet    traZODone (DESYREL) 100 MG tablet  3. Major depressive disorder, recurrent, severe without psychotic features (HCC)  F33.2 venlafaxine (EFFEXOR) 100 MG tablet    lamoTRIgine (LAMICTAL) 150 MG tablet    traZODone (DESYREL) 100 MG tablet    History of Present Illness:  Today the patient is doing very well.  She was told by her therapist that she has graduated from [facility] for borderline personality disorder.  She has learned a lot of skills and is ready to go forth.  Patient is involved in caring for her granddaughter who is in kindergarten.  The patient is a very stable relationship with her family.  She is sleeping and eating well.  Her mood is stable.  Today I shared with her that she likely had some form of a mood disorder most likely that of a bipolar in addition to borderline personality.  The patient takes Lamictal on a regular basis.  Patient continues to like to quilt watch TV and stays active.  She denies being euphoric date depressed or irritable.  The patient shows no evidence of psychosis.  Unfortunately the patient did have a fall recently and likely fractured her elbow.  She also bruised herself.  She getting close medical care for these conditions.  Should continue taking all the medications prescribed here and return to see me in 4 months.  She is not suicidal and she is functioning very well.   Depression Symptoms:  fatigue, (Hypo) Manic  Symptoms:   Anxiety Symptoms:   Psychotic Symptoms:   PTSD Symptoms:   Past Psychiatric History: 10 psychiatric hospitalizations multiple psychotropic medications presently in psychotherapy  Previous Psychotropic Medications: Yes   Substance Abuse History in the last 12 months:  Yes.    Consequences of Substance Abuse:   Past Medical History:  Past Medical History:  Diagnosis Date  . Anemia   . Anxiety   . Bipolar 1 disorder ([place])   . Collagen vascular disease ([place])    rhematoid arthritis  . Constipation   . Fibromyalgia   . GERD (gastroesophageal reflux disease)   . Heart murmur    mild-asymptomatic  . Hepatic steatosis   . History of Roux-en-Y gastric bypass   . Hypotension   . Hypothyroidism   . Opioid abuse ([place])   . Osteoarthritis   . Osteoporosis   . PONV (postoperative nausea and vomiting)    nausea only  . Rheumatic fever   . Rheumatoid arthritis ([place])   . Thyroid disease     Past Surgical History:  Procedure Laterality Date  . CHOLECYSTECTOMY  2004  . COLONOSCOPY N/A 07/16/2017   Procedure: COLONOSCOPY;  Surgeon: Manya Silvas, MD;  Location: [facility] ENDOSCOPY;  Service: Endoscopy;  Laterality: N/A;  . EXPLORATORY LAPAROTOMY  2009  . GASTRIC BYPASS  2003   [place] N/A 03/01/2018   Procedure: EXPLORATORY LAPAROTOMY/ UMBILECTOMY;  Surgeon: Robert Bellow, MD;  Location: [facility] ORS;  Service: General;  Laterality:  N/A;  . TONSILLECTOMY    . VENTRAL HERNIA REPAIR N/A 03/07/2018   Procedure: HERNIA REPAIR VENTRAL ADULT;  Surgeon: Robert Bellow, MD;  Location: [facility] ORS;  Service: General;  Laterality: N/A;    Family Psychiatric History:   Family History:  Family History  Problem Relation Age of Onset  . Depression Mother   . Dementia Mother   . Heart disease Mother   . Osteoporosis Mother   . Parkinson's disease Father   . Colon cancer Neg Hx     Social History:   Social History   Socioeconomic History  . Marital  status: Married    Spouse name: Not on file  . Number of children: Not on file  . Years of education: Not on file  . Highest education level: Not on file  Occupational History  . Not on file  Social Needs  . Financial resource strain: Not on file  . Food insecurity    Worry: Not on file    Inability: Not on file  . Transportation needs    Medical: Not on file    Non-medical: Not on file  Tobacco Use  . Smoking status: Never Smoker  . Smokeless tobacco: Never Used  Substance and Sexual Activity  . Alcohol use: No    Alcohol/week: 0.0 standard drinks  . Drug use: No  . Sexual activity: Yes  Lifestyle  . Physical activity    Days per week: Not on file    Minutes per session: Not on file  . Stress: Not on file  Relationships  . Social Herbalist on phone: Not on file    Gets together: Not on file    Attends religious service: Not on file    Active member of club or organization: Not on file    Attends meetings of clubs or organizations: Not on file    Relationship status: Not on file  Other Topics Concern  . Not on file  Social History Narrative  . Not on file    Additional Social History:   Allergies:   Allergies  Allergen Reactions  . Lactose Intolerance (Gi) Other (See Comments)    Bloating and GI distress  . Sulfa Antibiotics Hives    Metabolic Disorder Labs: No results found for: HGBA1C, MPG No results found for: PROLACTIN Lab Results  Component Value Date   TRIG 176 (H) 11/09/2015     Current Medications: Current Outpatient Medications  Medication Sig Dispense Refill  . albuterol (VENTOLIN HFA) 108 (90 Base) MCG/ACT inhaler Inhale 2 puffs into the lungs every 6 (six) hours as needed for wheezing or shortness of breath. 1 Inhaler 3  . aspirin EC 81 MG tablet Take 81 mg by mouth daily.    . busPIRone (BUSPAR) 30 MG tablet 1 bid 180 tablet 1  . Cholecalciferol (HM VITAMIN D3) 4000 units CAPS Take 4,000 Units by mouth daily.     . clonazePAM  (KLONOPIN) 0.5 MG tablet Take one tablet by mouth for acute anxiety, EMERGENCIES only - do not exceed 1 tab daily 30 tablet 3  . Coenzyme Q-10 200 MG CAPS Take 200 mg by mouth 2 (two) times daily.     . cyanocobalamin (,VITAMIN B-12,) 1000 MCG/ML injection Inject 1,000 mcg into the muscle every 30 (thirty) days.    Marland Kitchen desipramine (NORPRAMIN) 25 MG tablet 1  qam 90 tablet 3  . esomeprazole (NEXIUM) 20 MG capsule Take 20 mg by mouth daily.     Marland Kitchen  Fiber, Guar Gum, CHEW Chew 5 mg by mouth daily.     Marland Kitchen HYDROcodone-acetaminophen (NORCO/VICODIN) 5-325 MG tablet Take 1-2 tablets by mouth every 6 (six) hours as needed for severe pain. 20 tablet 0  . hydroxychloroquine (PLAQUENIL) 200 MG tablet Take one tablet by mouth twice a day Monday- Friday 120 tablet 0  . hydrOXYzine (ATARAX/VISTARIL) 50 MG tablet Take 1 tablet (50 mg total) by mouth 3 (three) times daily as needed for anxiety. 270 tablet 1  . ibuprofen (ADVIL,MOTRIN) 100 MG tablet Take 100 mg by mouth as needed for fever.    Javier Docker Oil 500 MG CAPS Take 500 mg by mouth daily.    Marland Kitchen lamoTRIgine (LAMICTAL) 150 MG tablet Take 1 tablet (150 mg total) by mouth 2 (two) times daily. 180 tablet 1  . levothyroxine (SYNTHROID) 112 MCG tablet Take 1 tablet (112 mcg total) by mouth daily. 90 tablet 0  . loratadine (CLARITIN) 10 MG tablet Take 10 mg by mouth daily as needed for allergies.    Marland Kitchen ondansetron (ZOFRAN ODT) 4 MG disintegrating tablet Take 1 tablet (4 mg total) by mouth every 8 (eight) hours as needed for nausea or vomiting. 12 tablet 0  . polyethylene glycol (MIRALAX) packet Take 17 g by mouth daily. 14 each 0  . sucralfate (CARAFATE) 1 g tablet Take 1 tablet (1 g total) by mouth 2 (two) times daily. 180 tablet 3  . tiZANidine (ZANAFLEX) 4 MG tablet Take 1 tablet (4 mg total) by mouth 3 (three) times daily as needed. 90 tablet 2  . traZODone (DESYREL) 100 MG tablet Take 1 tablet (100 mg total) by mouth at bedtime as needed and may repeat dose one time if  needed for sleep. 60 tablet 5  . venlafaxine (EFFEXOR) 100 MG tablet Take 1 tablet (100 mg total) by mouth 3 (three) times daily with meals. 90 tablet 4  . zoledronic acid (RECLAST) 5 MG/100ML SOLN injection Inject 5 mg into the vein See admin instructions. Patient takes yearly     No current facility-administered medications for this visit.     Neurologic: Headache: No Seizure: No Paresthesias:NA  Musculoskeletal: Strength & Muscle Tone: within normal limits Gait & Station: normal Patient leans: N/A  Psychiatric Specialty Exam: ROS  There were no vitals taken for this visit.There is no height or weight on file to calculate BMI.  General Appearance: Bizarre  Eye Contact:  Good  Speech:  Normal Rate  Volume:  Normal  Mood:  Anxious  Affect:  Congruent  Thought Process:  Goal Directed  Orientation:  NA  Thought Content:  Logical  Suicidal Thoughts:  No  Homicidal Thoughts:  No  Memory:  Negative  Judgement:  Fair  Insight:  NA and Good  Psychomotor Activity:  Normal  Concentration:    Recall:  Good  Fund of Knowledge:  Language: Good  Akathisia:  No  Handed:  Right  AIMS (if indicated):    Assets:  Desire for Improvement  ADL's:  Intact  Cognition: WNL  Sleep:      Treatment Plan Summary: This patient is #1 problem was borderline personality disorder.  This seems to be fairly well treated with DBT treatment.  Her second problem is likely that of major depression.  She takes Effexor and desipramine for that condition.  Noted that she is also on Lamictal.  Patient is doing very well.  These are 2 problems that she seems to have handled them fairly well.  She is handling the  pandemic well.  She will be seen again in 4 months. Jerral Ralph, MD 10/21/20203:19 PM

## 2019-05-22 ENCOUNTER — Ambulatory Visit: Payer: Medicare HMO | Admitting: Nurse Practitioner

## 2019-05-23 ENCOUNTER — Ambulatory Visit
Admission: RE | Admit: 2019-05-23 | Discharge: 2019-05-23 | Disposition: A | Discharge status: Home / Self Care | Payer: Medicare HMO | Attending: Adult Health | Admitting: Adult Health

## 2019-05-23 ENCOUNTER — Ambulatory Visit
Admission: RE | Admit: 2019-05-23 | Discharge: 2019-05-23 | Disposition: A | Discharge status: Home / Self Care | Payer: Medicare HMO | Source: Ambulatory Visit | Attending: Adult Health | Admitting: Adult Health

## 2019-05-23 ENCOUNTER — Other Ambulatory Visit: Payer: Self-pay

## 2019-05-23 DIAGNOSIS — M79622 Pain in left upper arm: Secondary | ICD-10-CM | POA: Diagnosis not present

## 2019-05-23 DIAGNOSIS — S52025A Nondisplaced fracture of olecranon process without intraarticular extension of left ulna, initial encounter for closed fracture: Secondary | ICD-10-CM | POA: Diagnosis not present

## 2019-05-26 ENCOUNTER — Other Ambulatory Visit: Payer: Self-pay | Admitting: Nurse Practitioner

## 2019-05-26 ENCOUNTER — Telehealth: Payer: Self-pay | Admitting: Nurse Practitioner

## 2019-05-26 DIAGNOSIS — M79622 Pain in left upper arm: Secondary | ICD-10-CM

## 2019-05-26 MED ORDER — HYDROCODONE-ACETAMINOPHEN 5-325 MG PO TABS: 1.0000 | ORAL_TABLET | Freq: Four times a day (QID) | ORAL | 0 refills | Status: DC | PRN

## 2019-05-26 NOTE — Telephone Encounter (Signed)
Patient had a xray on Friday , called for results ,  Need to call patient at [phone]

## 2019-05-26 NOTE — Progress Notes (Signed)
Sent new prescription for norco to [facility] [address]. #20 sent in today.

## 2019-05-26 NOTE — Telephone Encounter (Signed)
Sent new prescription for norco to [facility] [address]. #20 sent in today.

## 2019-05-26 NOTE — Telephone Encounter (Signed)
Pt advised on fracture elbow , will send referral to ortho per Heather .

## 2019-05-30 ENCOUNTER — Ambulatory Visit (INDEPENDENT_AMBULATORY_CARE_PROVIDER_SITE_OTHER): Payer: Medicare HMO | Admitting: Nurse Practitioner

## 2019-05-30 ENCOUNTER — Encounter: Payer: Self-pay | Admitting: Nurse Practitioner

## 2019-05-30 ENCOUNTER — Other Ambulatory Visit: Payer: Self-pay

## 2019-05-30 VITALS — BP 117/65 | HR 78 | Temp 98.2°F | Resp 16 | Ht 65.0 in | Wt 345.2 lb

## 2019-05-30 DIAGNOSIS — N390 Urinary tract infection, site not specified: Secondary | ICD-10-CM | POA: Diagnosis not present

## 2019-05-30 DIAGNOSIS — E039 Hypothyroidism, unspecified: Secondary | ICD-10-CM | POA: Diagnosis not present

## 2019-05-30 DIAGNOSIS — Z1231 Encounter for screening mammogram for malignant neoplasm of breast: Secondary | ICD-10-CM | POA: Diagnosis not present

## 2019-05-30 DIAGNOSIS — Z0001 Encounter for general adult medical examination with abnormal findings: Secondary | ICD-10-CM | POA: Diagnosis not present

## 2019-05-30 DIAGNOSIS — R300 Dysuria: Secondary | ICD-10-CM

## 2019-05-30 DIAGNOSIS — S42402D Unspecified fracture of lower end of left humerus, subsequent encounter for fracture with routine healing: Secondary | ICD-10-CM

## 2019-05-30 DIAGNOSIS — Z79899 Other long term (current) drug therapy: Secondary | ICD-10-CM

## 2019-05-30 DIAGNOSIS — R319 Hematuria, unspecified: Secondary | ICD-10-CM | POA: Diagnosis not present

## 2019-05-30 DIAGNOSIS — M79622 Pain in left upper arm: Secondary | ICD-10-CM

## 2019-05-30 LAB — POCT URINALYSIS DIPSTICK
Bilirubin, UA: NEGATIVE
Glucose, UA: NEGATIVE
Ketones, UA: NEGATIVE
Nitrite, UA: NEGATIVE
Protein, UA: POSITIVE — AB
Spec Grav, UA: 1.025 (ref 1.010–1.025)
Urobilinogen, UA: 0.2 E.U./dL
pH, UA: 6.5 (ref 5.0–8.0)

## 2019-05-30 LAB — POCT URINE DRUG SCREEN
POC Amphetamine UR: NOT DETECTED
POC BENZODIAZEPINES UR: NOT DETECTED
POC Barbiturate UR: NOT DETECTED
POC Cocaine UR: NOT DETECTED
POC Ecstasy UR: NOT DETECTED
POC Marijuana UR: NOT DETECTED
POC Methadone UR: NOT DETECTED
POC Methamphetamine UR: NOT DETECTED
POC Opiate Ur: POSITIVE — AB
POC Oxycodone UR: NOT DETECTED
POC PHENCYCLIDINE UR: NOT DETECTED
POC TRICYCLICS UR: POSITIVE — AB

## 2019-05-30 MED ORDER — NITROFURANTOIN MONOHYD MACRO 100 MG PO CAPS: 100.0000 mg | ORAL_CAPSULE | Freq: Two times a day (BID) | ORAL | 0 refills | Status: DC

## 2019-05-30 MED ORDER — HYDROCODONE-ACETAMINOPHEN 5-325 MG PO TABS: 1.0000 | ORAL_TABLET | Freq: Four times a day (QID) | ORAL | 0 refills | Status: DC | PRN

## 2019-05-30 NOTE — Progress Notes (Signed)
[facility] [place], [place] [address]  Internal MEDICINE  Office Visit Note  Patient Name: Jamie Bennett  [id number]  [id number]  Date of Service: 06/15/2019   Pt is here for routine health maintenance examination  Chief Complaint  Patient presents with  . Annual Exam  . Hypothyroidism  . Quality Metric Gaps    mammogram  . Urinary Tract Infection  . Pain    could get enough until seeing ortho on tuesday      The patient is here for health maintenance exam. She has developed dysuria and blood in urine a few days ago. She denies fever or abdominal pain. Keeps feeling as though she has to urinate even after she recently used the bathroom.  She had a fall in her son's home two weeks ago. She has a lump on her head and a fractured elbow. Initial x-ray, done in ER showed too much swelling to distinguish fracture. She has subsequant x-ray last week which did indicate a fracture. She was given a short term prescription for hydrocodone/APAP 5/325mg  which she is taking one to two times daily when needed for pain. She would like to have a new prescription until she sees orthopedic provider.  She will follow up with orthopedics on Tuesday.     Current Medication: Outpatient Encounter Medications as of 05/30/2019  Medication Sig  . albuterol (VENTOLIN HFA) 108 (90 Base) MCG/ACT inhaler Inhale 2 puffs into the lungs every 6 (six) hours as needed for wheezing or shortness of breath.  Marland Kitchen aspirin EC 81 MG tablet Take 81 mg by mouth daily.  . busPIRone (BUSPAR) 30 MG tablet 1 bid  . Cholecalciferol (HM VITAMIN D3) 4000 units CAPS Take 4,000 Units by mouth daily.   . clonazePAM (KLONOPIN) 0.5 MG tablet Take one tablet by mouth for acute anxiety, EMERGENCIES only - do not exceed 1 tab daily  . Coenzyme Q-10 200 MG CAPS Take 200 mg by mouth 2 (two) times daily.   . cyanocobalamin (,VITAMIN B-12,) 1000 MCG/ML injection Inject 1,000 mcg into the muscle every 30  (thirty) days.  Marland Kitchen desipramine (NORPRAMIN) 25 MG tablet 1  qam  . esomeprazole (NEXIUM) 20 MG capsule Take 20 mg by mouth daily.   . Fiber, Guar Gum, CHEW Chew 5 mg by mouth daily.   Marland Kitchen HYDROcodone-acetaminophen (NORCO/VICODIN) 5-325 MG tablet Take 1-2 tablets by mouth every 6 (six) hours as needed for severe pain.  . hydroxychloroquine (PLAQUENIL) 200 MG tablet Take one tablet by mouth twice a day Monday- Friday  . hydrOXYzine (ATARAX/VISTARIL) 50 MG tablet Take 1 tablet (50 mg total) by mouth 3 (three) times daily as needed for anxiety.  Marland Kitchen ibuprofen (ADVIL,MOTRIN) 100 MG tablet Take 100 mg by mouth as needed for fever.  Javier Docker Oil 500 MG CAPS Take 500 mg by mouth daily.  Marland Kitchen lamoTRIgine (LAMICTAL) 150 MG tablet Take 1 tablet (150 mg total) by mouth 2 (two) times daily.  Marland Kitchen levothyroxine (SYNTHROID) 112 MCG tablet Take 1 tablet (112 mcg total) by mouth daily.  Marland Kitchen loratadine (CLARITIN) 10 MG tablet Take 10 mg by mouth daily as needed for allergies.  Marland Kitchen ondansetron (ZOFRAN ODT) 4 MG disintegrating tablet Take 1 tablet (4 mg total) by mouth every 8 (eight) hours as needed for nausea or vomiting.  . polyethylene glycol (MIRALAX) packet Take 17 g by mouth daily.  Marland Kitchen tiZANidine (ZANAFLEX) 4 MG tablet Take 1 tablet (4 mg total) by mouth 3 (three) times daily as  needed.  . traZODone (DESYREL) 100 MG tablet Take 1 tablet (100 mg total) by mouth at bedtime as needed and Bennett repeat dose one time if needed for sleep.  Marland Kitchen venlafaxine (EFFEXOR) 100 MG tablet Take 1 tablet (100 mg total) by mouth 3 (three) times daily with meals.  . zoledronic acid (RECLAST) 5 MG/100ML SOLN injection Inject 5 mg into the vein See admin instructions. Patient takes yearly  . [DISCONTINUED] HYDROcodone-acetaminophen (NORCO/VICODIN) 5-325 MG tablet Take 1-2 tablets by mouth every 6 (six) hours as needed for severe pain.  . nitrofurantoin, macrocrystal-monohydrate, (MACROBID) 100 MG capsule Take 1 capsule (100 mg total) by mouth 2 (two)  times daily.  . [DISCONTINUED] sucralfate (CARAFATE) 1 g tablet Take 1 tablet (1 g total) by mouth 2 (two) times daily.   No facility-administered encounter medications on file as of 05/30/2019.     Surgical History: Past Surgical History:  Procedure Laterality Date  . CHOLECYSTECTOMY  2004  . COLONOSCOPY N/A 07/16/2017   Procedure: COLONOSCOPY;  Surgeon: Manya Silvas, MD;  Location: [facility] ENDOSCOPY;  Service: Endoscopy;  Laterality: N/A;  . EXPLORATORY LAPAROTOMY  2009  . GASTRIC BYPASS  2003   [place] N/A 03/01/2018   Procedure: EXPLORATORY LAPAROTOMY/ UMBILECTOMY;  Surgeon: Robert Bellow, MD;  Location: [facility] ORS;  Service: General;  Laterality: N/A;  . TONSILLECTOMY    . VENTRAL HERNIA REPAIR N/A 03/07/2018   Procedure: HERNIA REPAIR VENTRAL ADULT;  Surgeon: Robert Bellow, MD;  Location: [facility] ORS;  Service: General;  Laterality: N/A;    Medical History: Past Medical History:  Diagnosis Date  . Anemia   . Anxiety   . Bipolar 1 disorder ([place])   . Collagen vascular disease ([place])    rhematoid arthritis  . Constipation   . Fibromyalgia   . GERD (gastroesophageal reflux disease)   . Heart murmur    mild-asymptomatic  . Hepatic steatosis   . History of Roux-en-Y gastric bypass   . Hypotension   . Hypothyroidism   . Opioid abuse ([place])   . Osteoarthritis   . Osteoporosis   . PONV (postoperative nausea and vomiting)    nausea only  . Rheumatic fever   . Rheumatoid arthritis ([place])   . Thyroid disease     Family History: Family History  Problem Relation Age of Onset  . Depression Mother   . Dementia Mother   . Heart disease Mother   . Osteoporosis Mother   . Parkinson's disease Father   . Colon cancer Neg Hx       Review of Systems  Constitutional: Positive for activity change. Negative for chills, fatigue and unexpected weight change.       Due to left arm fracture.   HENT: Negative for congestion, rhinorrhea, sneezing and sore  throat.   Respiratory: Negative for cough, chest tightness, shortness of breath and wheezing.   Cardiovascular: Negative for chest pain and palpitations.  Gastrointestinal: Negative for abdominal pain, constipation, diarrhea, nausea and vomiting.  Endocrine: Negative for cold intolerance, heat intolerance, polydipsia and polyuria.  Genitourinary: Positive for dysuria, frequency and urgency.  Musculoskeletal: Negative for arthralgias, back pain, joint swelling and neck pain.       Left arm pain. There is presence of fracture. Patient has visit with orthopedics scheduled on this coming Tuesday.   Skin: Negative for rash.  Allergic/Immunologic: Negative.   Neurological: Negative for dizziness, tremors, numbness and headaches.  Hematological: Negative for adenopathy. Does not bruise/bleed easily.  Psychiatric/Behavioral:  Positive for sleep disturbance. Negative for behavioral problems. The patient is nervous/anxious.        Regularly sees psychiatry.      Today's Vitals   05/30/19 1006  BP: 117/65  Pulse: 78  Resp: 16  Temp: 98.2 F (36.8 C)  SpO2: 97%  Weight: (!) 345 lb 3.2 oz (156.6 kg)  Height: 5\' 5"  (1.651 m)   Body mass index is 57.44 kg/m.  Physical Exam Vitals signs and nursing note reviewed.  Constitutional:      Appearance: Normal appearance. She is obese.  HENT:     Head: Normocephalic and atraumatic.     Nose: Nose normal.  Eyes:     Extraocular Movements: Extraocular movements intact.     Pupils: Pupils are equal, round, and reactive to light.  Neck:     Musculoskeletal: Normal range of motion and neck supple.     Vascular: No carotid bruit.  Cardiovascular:     Rate and Rhythm: Normal rate and regular rhythm.     Pulses: Normal pulses.     Heart sounds: Normal heart sounds.  Pulmonary:     Effort: Pulmonary effort is normal.     Breath sounds: Normal breath sounds.  Abdominal:     General: Bowel sounds are normal.     Palpations: Abdomen is soft.      Tenderness: There is no abdominal tenderness.  Genitourinary:    Comments: U/a positive for moderate WBC and moderate blood.  Musculoskeletal:     Comments: Left arm is currently splinted and in sling. There is good ROM of fingers of left hand and capillary refill is intact.   Skin:    General: Skin is warm and dry.  Neurological:     Mental Status: She is alert and oriented to person, place, and time. Mental status is at baseline.  Psychiatric:        Mood and Affect: Mood normal.        Behavior: Behavior normal.        Thought Content: Thought content normal.        Judgment: Judgment normal.    Depression screen [facility] 2/9 05/30/2019 05/19/2019 05/27/2018 02/15/2018 01/03/2018  Decreased Interest 0 0 0 0 2  Down, Depressed, Hopeless 0 0 0 0 2  PHQ - 2 Score 0 0 0 0 4  Altered sleeping - - - 0 0  Tired, decreased energy - - - 0 3  Change in appetite - - - 0 3  Feeling bad or failure about yourself  - - - 0 3  Trouble concentrating - - - 0 0  Moving slowly or fidgety/restless - - - 0 0  Suicidal thoughts - - - 0 1  PHQ-9 Score - - - 0 14  Difficult doing work/chores - - - Not difficult at all Somewhat difficult  Some encounter information is confidential and restricted. Go to Review Flowsheets activity to see all data.  Some recent data might be hidden    Functional Status Survey: Is the patient deaf or have difficulty hearing?: No Does the patient have difficulty seeing, even when wearing glasses/contacts?: No Does the patient have difficulty concentrating, remembering, or making decisions?: No Does the patient have difficulty walking or climbing stairs?: Yes(climbing stairs) Does the patient have difficulty dressing or bathing?: No Does the patient have difficulty doing errands alone such as visiting a doctor's office or shopping?: No  MMSE - [place] Exam 06/15/2019  Orientation to time 5  Orientation to Place 5  Registration 3  Attention/ Calculation 5  Recall 3   Language- name 2 objects 2  Language- repeat 1  Language- follow 3 step command 3  Language- read & follow direction 1  Write a sentence 1  Copy design 1  Total score 30  Some encounter information is confidential and restricted. Go to Review Flowsheets activity to see all data.    Fall Risk  05/30/2019 05/19/2019 11/26/2018 07/11/2018 05/27/2018  Falls in the past year? 1 1 1  0 No  Number falls in past yr: 0 0 0 0 -  Injury with Fall? 1 1 0 0 -      LABS: Recent Results (from the past 2160 hour(s))  POCT Urine Drug Screen     Status: Abnormal   Collection Time: 05/30/19 10:15 AM  Result Value Ref Range   POC METHAMPHETAMINE UR None Detected None Detected   POC Opiate Ur Positive (A) None Detected   POC Barbiturate UR None Detected None Detected   POC Amphetamine UR None Detected None Detected   POC Oxycodone UR None Detected None Detected   POC Cocaine UR None Detected None Detected   POC Ecstasy UR None Detected None Detected   POC TRICYCLICS UR Positive (A) None Detected   POC PHENCYCLIDINE UR None Detected None Detected   POC MARIJUANA UR None Detected None Detected   POC METHADONE UR None Detected None Detected   POC BENZODIAZEPINES UR None Detected None Detected   URINE TEMPERATURE     POC DRUG SCREEN OXIDANTS URINE     POC SPECIFIC GRAVITY URINE     POC PH URINE     Methylenedioxyamphetamine    POCT Urinalysis Dipstick     Status: Abnormal   Collection Time: 05/30/19 10:30 AM  Result Value Ref Range   Color, UA     Clarity, UA     Glucose, UA Negative Negative   Bilirubin, UA negative    Ketones, UA negative    Spec Grav, UA 1.025 1.010 - 1.025   Blood, UA large    pH, UA 6.5 5.0 - 8.0   Protein, UA Positive (A) Negative    Comment: 100+   Urobilinogen, UA 0.2 0.2 or 1.0 E.U./dL   Nitrite, UA negative    Leukocytes, UA Moderate (2+) (A) Negative   Appearance     Odor    CULTURE, URINE COMPREHENSIVE     Status: Abnormal   Collection Time: 05/30/19  10:30 PM   Specimen: Urine   URINE  Result Value Ref Range   Urine Culture, Comprehensive Final report (A)    Organism ID, Bacteria Comment (A)     Comment: Escherichia coli, identified by an automated biochemical system. Greater than 100,000 colony forming units per mL Cefazolin <=4 ug/mL Cefazolin with an MIC <=16 predicts susceptibility to the oral agents cefaclor, cefdinir, cefpodoxime, cefprozil, cefuroxime, cephalexin, and loracarbef when used for therapy of uncomplicated urinary tract infections due to E. coli, Klebsiella pneumoniae, and Proteus mirabilis.    Organism ID, Bacteria Comment     Comment: Mixed urogenital flora 10,000-25,000 colony forming units per mL    ANTIMICROBIAL SUSCEPTIBILITY Comment     Comment:       ** S = Susceptible; I = Intermediate; R = Resistant **                    P = Positive; N = Negative  MICS are expressed in micrograms per mL    Antibiotic                 RSLT#1    RSLT#2    RSLT#3    RSLT#4 Amoxicillin/Clavulanic Acid    S Ampicillin                     R Cefepime                       S Ceftriaxone                    S Cefuroxime                     S Ciprofloxacin                  S Ertapenem                      S Gentamicin                     S Imipenem                       S Levofloxacin                   S Meropenem                      S Nitrofurantoin                 S Piperacillin/Tazobactam        S Tetracycline                   S Tobramycin                     S Trimethoprim/Sulfa             S   Novel Coronavirus, NAA ([facility])     Status: None   Collection Time: 06/06/19  3:09 PM   Specimen: Oropharyngeal(OP) collection in vial transport medium   OROPHARYNGEA  TESTING  Result Value Ref Range   SARS-CoV-2, NAA Not Detected Not Detected    Comment: This nucleic acid amplification test was developed and its performance characteristics determined by [employer]. Nucleic acid amplification  tests include PCR and TMA. This test has not been FDA cleared or approved. This test has been authorized by FDA under an Emergency Use Authorization (EUA). This test is only authorized for the duration of time the declaration that circumstances exist justifying the authorization of the emergency use of in vitro diagnostic tests for detection of SARS-CoV-2 virus and/or diagnosis of COVID-19 infection under section 564(b)(1) of the Act, 21 U.S.C. GF:7541899) (1), unless the authorization is terminated or revoked sooner. When diagnostic testing is negative, the possibility of a false negative result should be considered in the context of a patient's recent exposures and the presence of clinical signs and symptoms consistent with COVID-19. An individual without symptoms of COVID-19 and who is not shedding SARS-CoV-2 virus would  expect to have a negative (not detected) result in this assay.     Assessment/Plan: 1. Encounter for general adult medical examination with abnormal findings Annual health maintenance exam today.  2. Closed fracture of left elbow with routine healing, subsequent encounter Confirmed via x-ray. Short term prescription for  hydrocodone/APAP 5/325mg  given today. Bennett be taken every 6 hours as needed for pain. She should follow up with orthopedics as scheduled.   - HYDROcodone-acetaminophen (NORCO/VICODIN) 5-325 MG tablet; Take 1-2 tablets by mouth every 6 (six) hours as needed for severe pain.  Dispense: 20 tablet; Refill: 0  3. Urinary tract infection with hematuria, site unspecified Start macrobid 100mg  bid for 10 days. Send urine for culture and sensitivity and adust antibitioics as indicated.  - nitrofurantoin, macrocrystal-monohydrate, (MACROBID) 100 MG capsule; Take 1 capsule (100 mg total) by mouth 2 (two) times daily.  Dispense: 20 capsule; Refill: 0  4. Acquired hypothyroidism Continue levothyroxine as indicated.   5. Encounter for screening mammogram for  malignant neoplasm of breast - MM DIGITAL SCREENING BILATERAL; Future  6. Encounter for long-term (current) use of medications - POCT Urine Drug Screen appropriatley positive for TCA and opiates.  7. Dysuria - POCT Urinalysis Dipstick - CULTURE, URINE COMPREHENSIVE  General Counseling: Adrieana verbalizes understanding of the findings of todays visit and agrees with plan of treatment. I have discussed any further diagnostic evaluation that Bennett be needed or ordered today. We also reviewed her medications today. she has been encouraged to call the office with any questions or concerns that should arise related to todays visit.    Counseling:  Reviewed risks and possible side effects associated with taking opiates, benzodiazepines and other CNS depressants. Combination of these could cause dizziness and drowsiness. Advised patient not to drive or operate machinery when taking these medications, as patient's and other's life can be at risk and will have consequences. Patient verbalized understanding in this matter. Dependence and abuse for these drugs will be monitored closely. A Controlled substance policy and procedure is on file which allows [facility] to order a urine drug screen test at any visit. Patient understands and agrees with the plan  This patient was seen by Leretha Pol FNP Collaboration with Dr Lavera Guise as a part of collaborative care agreement  Orders Placed This Encounter  Procedures  . CULTURE, URINE COMPREHENSIVE  . MM DIGITAL SCREENING BILATERAL  . POCT Urine Drug Screen  . POCT Urinalysis Dipstick    Meds ordered this encounter  Medications  . HYDROcodone-acetaminophen (NORCO/VICODIN) 5-325 MG tablet    Sig: Take 1-2 tablets by mouth every 6 (six) hours as needed for severe pain.    Dispense:  20 tablet    Refill:  0    Order Specific Question:   Supervising Provider    Answer:   Lavera Guise [id number]  . nitrofurantoin, macrocrystal-monohydrate,  (MACROBID) 100 MG capsule    Sig: Take 1 capsule (100 mg total) by mouth 2 (two) times daily.    Dispense:  20 capsule    Refill:  0    Order Specific Question:   Supervising Provider    Answer:   Lavera Guise [id number]    Time spent: [place], MD  Internal Medicine

## 2019-06-02 ENCOUNTER — Other Ambulatory Visit: Payer: Self-pay | Admitting: Nurse Practitioner

## 2019-06-02 DIAGNOSIS — K269 Duodenal ulcer, unspecified as acute or chronic, without hemorrhage or perforation: Secondary | ICD-10-CM

## 2019-06-02 MED ORDER — SUCRALFATE 1 G PO TABS: 1.0000 g | ORAL_TABLET | Freq: Two times a day (BID) | ORAL | 3 refills | Status: DC

## 2019-06-03 DIAGNOSIS — S52025A Nondisplaced fracture of olecranon process without intraarticular extension of left ulna, initial encounter for closed fracture: Secondary | ICD-10-CM | POA: Diagnosis not present

## 2019-06-03 DIAGNOSIS — Z6841 Body Mass Index (BMI) 40.0 and over, adult: Secondary | ICD-10-CM | POA: Diagnosis not present

## 2019-06-03 DIAGNOSIS — M25522 Pain in left elbow: Secondary | ICD-10-CM | POA: Diagnosis not present

## 2019-06-04 LAB — CULTURE, URINE COMPREHENSIVE

## 2019-06-04 NOTE — Progress Notes (Signed)
Patient started on macrobid during visit .

## 2019-06-06 ENCOUNTER — Ambulatory Visit (INDEPENDENT_AMBULATORY_CARE_PROVIDER_SITE_OTHER): Payer: Medicare HMO | Admitting: Nurse Practitioner

## 2019-06-06 ENCOUNTER — Other Ambulatory Visit: Payer: Self-pay

## 2019-06-06 ENCOUNTER — Encounter: Payer: Self-pay | Admitting: Nurse Practitioner

## 2019-06-06 ENCOUNTER — Other Ambulatory Visit: Payer: Self-pay | Admitting: *Deleted

## 2019-06-06 VITALS — Temp 99.6°F | Ht 65.0 in | Wt 325.0 lb

## 2019-06-06 DIAGNOSIS — Z20822 Contact with and (suspected) exposure to covid-19: Secondary | ICD-10-CM

## 2019-06-06 DIAGNOSIS — Z20828 Contact with and (suspected) exposure to other viral communicable diseases: Secondary | ICD-10-CM

## 2019-06-06 DIAGNOSIS — J069 Acute upper respiratory infection, unspecified: Secondary | ICD-10-CM | POA: Diagnosis not present

## 2019-06-06 MED ORDER — AMOXICILLIN-POT CLAVULANATE 875-125 MG PO TABS: ORAL_TABLET | ORAL | 0 refills | Status: DC

## 2019-06-06 NOTE — Progress Notes (Signed)
[facility] [place], [place] [address]  Internal MEDICINE  Telephone Visit  Patient Name: Jamie Bennett  [id number]  [id number]  Date of Service: 06/06/2019  I connected with the patient at 1:46 by webcam and verified the patients identity using two identifiers.   I discussed the limitations, risks, security and privacy concerns of performing an evaluation and management service by webcam and the availability of in person appointments. I also discussed with the patient that there may be a patient responsible charge related to the service.  The patient expressed understanding and agrees to proceed.    Chief Complaint  Patient presents with  . Telephone Assessment  . Telephone Screen  . Muscle Pain    covid symptoms   . Migraine  . Fever    101  . Nasal Congestion    The patient has been contacted via telephone for follow up visit due to concerns for spread of novel coronavirus. The patient states that, yesterday, she started feeling very poorly. Joints and muscle pain became severe, she has significant headache. Has mild fever. She states that she has a ringing, vibration feeling in her ears. She does not believe she has been around anyone who has COVID. She does go to [employer] and [employer]. Very careful about hand hygiene and wearing a face mask. She has a young grandson who she is afraid to expose to COVID if that is what she has.       Current Medication: Outpatient Encounter Medications as of 06/06/2019  Medication Sig  . albuterol (VENTOLIN HFA) 108 (90 Base) MCG/ACT inhaler Inhale 2 puffs into the lungs every 6 (six) hours as needed for wheezing or shortness of breath.  Marland Kitchen aspirin EC 81 MG tablet Take 81 mg by mouth daily.  . busPIRone (BUSPAR) 30 MG tablet 1 bid  . Cholecalciferol (HM VITAMIN D3) 4000 units CAPS Take 4,000 Units by mouth daily.   . clonazePAM (KLONOPIN) 0.5 MG tablet Take one tablet by mouth for acute anxiety, EMERGENCIES only -  do not exceed 1 tab daily  . Coenzyme Q-10 200 MG CAPS Take 200 mg by mouth 2 (two) times daily.   . cyanocobalamin (,VITAMIN B-12,) 1000 MCG/ML injection Inject 1,000 mcg into the muscle every 30 (thirty) days.  Marland Kitchen desipramine (NORPRAMIN) 25 MG tablet 1  qam  . esomeprazole (NEXIUM) 20 MG capsule Take 20 mg by mouth daily.   . Fiber, Guar Gum, CHEW Chew 5 mg by mouth daily.   Marland Kitchen HYDROcodone-acetaminophen (NORCO/VICODIN) 5-325 MG tablet Take 1-2 tablets by mouth every 6 (six) hours as needed for severe pain.  . hydroxychloroquine (PLAQUENIL) 200 MG tablet Take one tablet by mouth twice a day Monday- Friday  . hydrOXYzine (ATARAX/VISTARIL) 50 MG tablet Take 1 tablet (50 mg total) by mouth 3 (three) times daily as needed for anxiety.  Marland Kitchen ibuprofen (ADVIL,MOTRIN) 100 MG tablet Take 100 mg by mouth as needed for fever.  Javier Docker Oil 500 MG CAPS Take 500 mg by mouth daily.  Marland Kitchen lamoTRIgine (LAMICTAL) 150 MG tablet Take 1 tablet (150 mg total) by mouth 2 (two) times daily.  Marland Kitchen levothyroxine (SYNTHROID) 112 MCG tablet Take 1 tablet (112 mcg total) by mouth daily.  Marland Kitchen loratadine (CLARITIN) 10 MG tablet Take 10 mg by mouth daily as needed for allergies.  . nitrofurantoin, macrocrystal-monohydrate, (MACROBID) 100 MG capsule Take 1 capsule (100 mg total) by mouth 2 (two) times daily.  . ondansetron (ZOFRAN ODT) 4 MG disintegrating tablet  Take 1 tablet (4 mg total) by mouth every 8 (eight) hours as needed for nausea or vomiting.  . polyethylene glycol (MIRALAX) packet Take 17 g by mouth daily.  . sucralfate (CARAFATE) 1 g tablet Take 1 tablet (1 g total) by mouth 2 (two) times daily.  Marland Kitchen tiZANidine (ZANAFLEX) 4 MG tablet Take 1 tablet (4 mg total) by mouth 3 (three) times daily as needed.  . traZODone (DESYREL) 100 MG tablet Take 1 tablet (100 mg total) by mouth at bedtime as needed and may repeat dose one time if needed for sleep.  Marland Kitchen venlafaxine (EFFEXOR) 100 MG tablet Take 1 tablet (100 mg total) by mouth 3 (three)  times daily with meals.  . zoledronic acid (RECLAST) 5 MG/100ML SOLN injection Inject 5 mg into the vein See admin instructions. Patient takes yearly  . amoxicillin-clavulanate (AUGMENTIN) 875-125 MG tablet Take 1 tablet po BID for URI possible COVID 19   No facility-administered encounter medications on file as of 06/06/2019.     Surgical History: Past Surgical History:  Procedure Laterality Date  . CHOLECYSTECTOMY  2004  . COLONOSCOPY N/A 07/16/2017   Procedure: COLONOSCOPY;  Surgeon: Manya Silvas, MD;  Location: [facility] ENDOSCOPY;  Service: Endoscopy;  Laterality: N/A;  . EXPLORATORY LAPAROTOMY  2009  . GASTRIC BYPASS  2003   [place] N/A 03/01/2018   Procedure: EXPLORATORY LAPAROTOMY/ UMBILECTOMY;  Surgeon: Robert Bellow, MD;  Location: [facility] ORS;  Service: General;  Laterality: N/A;  . TONSILLECTOMY    . VENTRAL HERNIA REPAIR N/A 03/07/2018   Procedure: HERNIA REPAIR VENTRAL ADULT;  Surgeon: Robert Bellow, MD;  Location: [facility] ORS;  Service: General;  Laterality: N/A;    Medical History: Past Medical History:  Diagnosis Date  . Anemia   . Anxiety   . Bipolar 1 disorder ([place])   . Collagen vascular disease ([place])    rhematoid arthritis  . Constipation   . Fibromyalgia   . GERD (gastroesophageal reflux disease)   . Heart murmur    mild-asymptomatic  . Hepatic steatosis   . History of Roux-en-Y gastric bypass   . Hypotension   . Hypothyroidism   . Opioid abuse ([place])   . Osteoarthritis   . Osteoporosis   . PONV (postoperative nausea and vomiting)    nausea only  . Rheumatic fever   . Rheumatoid arthritis ([place])   . Thyroid disease     Family History: Family History  Problem Relation Age of Onset  . Depression Mother   . Dementia Mother   . Heart disease Mother   . Osteoporosis Mother   . Parkinson's disease Father   . Colon cancer Neg Hx     Social History   Socioeconomic History  . Marital status: Married    Spouse name: Not on  file  . Number of children: Not on file  . Years of education: Not on file  . Highest education level: Not on file  Occupational History  . Not on file  Social Needs  . Financial resource strain: Not on file  . Food insecurity    Worry: Not on file    Inability: Not on file  . Transportation needs    Medical: Not on file    Non-medical: Not on file  Tobacco Use  . Smoking status: Never Smoker  . Smokeless tobacco: Never Used  Substance and Sexual Activity  . Alcohol use: No    Alcohol/week: 0.0 standard drinks  . Drug use: No  .  Sexual activity: Yes  Lifestyle  . Physical activity    Days per week: Not on file    Minutes per session: Not on file  . Stress: Not on file  Relationships  . Social Herbalist on phone: Not on file    Gets together: Not on file    Attends religious service: Not on file    Active member of club or organization: Not on file    Attends meetings of clubs or organizations: Not on file    Relationship status: Not on file  . Intimate partner violence    Fear of current or ex partner: Not on file    Emotionally abused: Not on file    Physically abused: Not on file    Forced sexual activity: Not on file  Other Topics Concern  . Not on file  Social History Narrative  . Not on file      Review of Systems  Constitutional: Positive for chills, fatigue and fever.  HENT: Positive for congestion, postnasal drip, rhinorrhea and sore throat. Negative for ear pain, sinus pain and voice change.   Respiratory: Negative for cough and wheezing.   Cardiovascular: Negative for chest pain and palpitations.  Gastrointestinal: Positive for nausea. Negative for vomiting.  Endocrine: Negative.   Musculoskeletal: Positive for arthralgias and myalgias.  Skin: Positive for rash.       In the folds of her skin around her bottom. Developed after she started antibiotics for UTI last Thursday.   Allergic/Immunologic: Negative.   Neurological: Positive for  headaches.    Today's Vitals   06/06/19 1338  Temp: 99.6 F (37.6 C)  Weight: (!) 325 lb (147.4 kg)  Height: 5\' 5"  (1.651 m)   Body mass index is 54.08 kg/m.  Observation/Objective:   The patient is alert and oriented. She is pleasant and answers all questions appropriately. Breathing is non-labored. She is in no acute distress at this time.  The patient is nasally congested and appears to feel poorly.    Assessment/Plan: 1. Acute upper respiratory infection Change macrobid to augmentin 875mg  bid for 10 days. Rest and increase fluids. Take tylenol as needed for pain and fever. Test for COVID 19 and contact patient with results when available.  - amoxicillin-clavulanate (AUGMENTIN) 875-125 MG tablet; Take 1 tablet po BID for URI possible COVID 19  Dispense: 20 tablet; Refill: 0 - Novel Coronavirus, NAA ([facility])  2. Exposure to COVID-19 virus Test for COVID 19 and contact patient with results when available.  - Novel Coronavirus, NAA ([facility])    Person Under Monitoring Name: EVAYA BRIDSON  Location: [place] [address]   Infection Prevention Recommendations for Individuals Confirmed to have, or Being Evaluated for, 2019 Novel Coronavirus (COVID-19) Infection Who Receive Care at Home  Individuals who are confirmed to have, or are being evaluated for, COVID-19 should follow the prevention steps below until a healthcare provider or local or state health department says they can return to normal activities.  Stay home except to get medical care You should restrict activities outside your home, except for getting medical care. Do not go to work, school, or public areas, and do not use public transportation or taxis.  Call ahead before visiting your doctor Before your medical appointment, call the healthcare provider and tell them that you have, or are being evaluated for, COVID-19 infection. This will help the healthcare provider's office take  steps to keep other people from getting infected. Ask your  healthcare provider to call the local or state health department.  Monitor your symptoms Seek prompt medical attention if your illness is worsening (e.g., difficulty breathing). Before going to your medical appointment, call the healthcare provider and tell them that you have, or are being evaluated for, COVID-19 infection. Ask your healthcare provider to call the local or state health department.  Wear a facemask You should wear a facemask that covers your nose and mouth when you are in the same room with other people and when you visit a healthcare provider. People who live with or visit you should also wear a facemask while they are in the same room with you.  Separate yourself from other people in your home As much as possible, you should stay in a different room from other people in your home. Also, you should use a separate bathroom, if available.  Avoid sharing household items You should not share dishes, drinking glasses, cups, eating utensils, towels, bedding, or other items with other people in your home. After using these items, you should wash them thoroughly with soap and water.  Cover your coughs and sneezes Cover your mouth and nose with a tissue when you cough or sneeze, or you can cough or sneeze into your sleeve. Throw used tissues in a lined trash can, and immediately wash your hands with soap and water for at least 20 seconds or use an alcohol-based hand rub.  Wash your [employer] [employer] your hands often and thoroughly with soap and water for at least 20 seconds. You can use an alcohol-based hand sanitizer if soap and water are not available and if your hands are not visibly dirty. Avoid touching your eyes, nose, and mouth with unwashed hands.   Prevention Steps for Caregivers and Household Members of Individuals Confirmed to have, or Being Evaluated for, COVID-19 Infection Being Cared for in the Home  If you  live with, or provide care at home for, a person confirmed to have, or being evaluated for, COVID-19 infection please follow these guidelines to prevent infection:  Follow healthcare provider's instructions Make sure that you understand and can help the patient follow any healthcare provider instructions for all care.  Provide for the patient's basic needs You should help the patient with basic needs in the home and provide support for getting groceries, prescriptions, and other personal needs.  Monitor the patient's symptoms If they are getting sicker, call his or her medical provider and tell them that the patient has, or is being evaluated for, COVID-19 infection. This will help the healthcare provider's office take steps to keep other people from getting infected. Ask the healthcare provider to call the local or state health department.  Limit the number of people who have contact with the patient  If possible, have only one caregiver for the patient.  Other household members should stay in another home or place of residence. If this is not possible, they should stay  in another room, or be separated from the patient as much as possible. Use a separate bathroom, if available.  Restrict visitors who do not have an essential need to be in the home.  Keep older adults, very young children, and other sick people away from the patient Keep older adults, very young children, and those who have compromised immune systems or chronic health conditions away from the patient. This includes people with chronic heart, lung, or kidney conditions, diabetes, and cancer.  Ensure good ventilation Make sure that shared spaces in the home  have good air flow, such as from an air conditioner or an opened window, weather permitting.  Wash your hands often  Wash your hands often and thoroughly with soap and water for at least 20 seconds. You can use an alcohol based hand sanitizer if soap and water are  not available and if your hands are not visibly dirty.  Avoid touching your eyes, nose, and mouth with unwashed hands.  Use disposable paper towels to dry your hands. If not available, use dedicated cloth towels and replace them when they become wet.  Wear a facemask and gloves  Wear a disposable facemask at all times in the room and gloves when you touch or have contact with the patient's blood, body fluids, and/or secretions or excretions, such as sweat, saliva, sputum, nasal mucus, vomit, urine, or feces.  Ensure the mask fits over your nose and mouth tightly, and do not touch it during use.  Throw out disposable facemasks and gloves after using them. Do not reuse.  Wash your hands immediately after removing your facemask and gloves.  If your personal clothing becomes contaminated, carefully remove clothing and launder. Wash your hands after handling contaminated clothing.  Place all used disposable facemasks, gloves, and other waste in a lined container before disposing them with other household waste.  Remove gloves and wash your hands immediately after handling these items.  Do not share dishes, glasses, or other household items with the patient  Avoid sharing household items. You should not share dishes, drinking glasses, cups, eating utensils, towels, bedding, or other items with a patient who is confirmed to have, or being evaluated for, COVID-19 infection.  After the person uses these items, you should wash them thoroughly with soap and water.  Wash laundry thoroughly  Immediately remove and wash clothes or bedding that have blood, body fluids, and/or secretions or excretions, such as sweat, saliva, sputum, nasal mucus, vomit, urine, or feces, on them.  Wear gloves when handling laundry from the patient.  Read and follow directions on labels of laundry or clothing items and detergent. In general, wash and dry with the warmest temperatures recommended on the label.  Clean  all areas the individual has used often  Clean all touchable surfaces, such as counters, tabletops, doorknobs, bathroom fixtures, toilets, phones, keyboards, tablets, and bedside tables, every day. Also, clean any surfaces that may have blood, body fluids, and/or secretions or excretions on them.  Wear gloves when cleaning surfaces the patient has come in contact with.  Use a diluted bleach solution (e.g., dilute bleach with 1 part bleach and 10 parts water) or a household disinfectant with a label that says EPA-registered for coronaviruses. To make a bleach solution at home, add 1 tablespoon of bleach to 1 quart (4 cups) of water. For a larger supply, add  cup of bleach to 1 gallon (16 cups) of water.  Read labels of cleaning products and follow recommendations provided on product labels. Labels contain instructions for safe and effective use of the cleaning product including precautions you should take when applying the product, such as wearing gloves or eye protection and making sure you have good ventilation during use of the product.  Remove gloves and wash hands immediately after cleaning.  Monitor yourself for signs and symptoms of illness Caregivers and household members are considered close contacts, should monitor their health, and will be asked to limit movement outside of the home to the extent possible. Follow the monitoring steps for close contacts listed on the  symptom monitoring form.   ? If you have additional questions, contact your local health department or call the epidemiologist on call at [phone] (available 24/7). ? This guidance is subject to change. For the most up-to-date guidance from [facility], please refer to their website: YouBlogs.pl   General Counseling: adelayde bestor understanding of the findings of today's phone visit and agrees with plan of treatment. I have discussed any further diagnostic  evaluation that may be needed or ordered today. We also reviewed her medications today. she has been encouraged to call the office with any questions or concerns that should arise related to todays visit.   This patient was seen by Leretha Pol FNP Collaboration with Dr Lavera Guise as a part of collaborative care agreement  Orders Placed This Encounter  Procedures  . Novel Coronavirus, NAA ([facility])    Meds ordered this encounter  Medications  . amoxicillin-clavulanate (AUGMENTIN) 875-125 MG tablet    Sig: Take 1 tablet po BID for URI possible COVID 19    Dispense:  20 tablet    Refill:  0    Order Specific Question:   Supervising Provider    Answer:   Lavera Guise [id number]    Time spent: 10 Minutes    Dr Lavera Guise Internal medicine

## 2019-06-08 LAB — NOVEL CORONAVIRUS, NAA: SARS-CoV-2, NAA: NOT DETECTED

## 2019-06-09 ENCOUNTER — Telehealth: Payer: Self-pay

## 2019-06-09 ENCOUNTER — Other Ambulatory Visit: Payer: Self-pay | Admitting: Nurse Practitioner

## 2019-06-09 DIAGNOSIS — K269 Duodenal ulcer, unspecified as acute or chronic, without hemorrhage or perforation: Secondary | ICD-10-CM

## 2019-06-09 MED ORDER — SUCRALFATE 1 G PO TABS: 1.0000 g | ORAL_TABLET | Freq: Two times a day (BID) | ORAL | 3 refills | Status: DC

## 2019-06-09 NOTE — Telephone Encounter (Signed)
Pt advised covid test is negative

## 2019-06-09 NOTE — Telephone Encounter (Signed)
-----   Message from Ronnell Freshwater, NP sent at 06/09/2019  8:05 AM EST ----- Please let the patient know that her test for covid 19 was negative .

## 2019-06-09 NOTE — Progress Notes (Signed)
Please let the patient know that her test for covid 19 was negative .

## 2019-06-15 ENCOUNTER — Other Ambulatory Visit: Payer: Self-pay | Admitting: Rheumatology

## 2019-06-15 DIAGNOSIS — N390 Urinary tract infection, site not specified: Secondary | ICD-10-CM | POA: Insufficient documentation

## 2019-06-15 DIAGNOSIS — Z79899 Other long term (current) drug therapy: Secondary | ICD-10-CM | POA: Insufficient documentation

## 2019-06-15 DIAGNOSIS — S42402A Unspecified fracture of lower end of left humerus, initial encounter for closed fracture: Secondary | ICD-10-CM | POA: Insufficient documentation

## 2019-06-15 DIAGNOSIS — Z0001 Encounter for general adult medical examination with abnormal findings: Secondary | ICD-10-CM | POA: Insufficient documentation

## 2019-06-15 DIAGNOSIS — M0579 Rheumatoid arthritis with rheumatoid factor of multiple sites without organ or systems involvement: Secondary | ICD-10-CM

## 2019-06-16 ENCOUNTER — Telehealth: Payer: Self-pay | Admitting: Rheumatology

## 2019-06-16 NOTE — Telephone Encounter (Signed)
Prescription sent to the pharmacy at 11:10 am this morning.

## 2019-06-16 NOTE — Telephone Encounter (Signed)
Patient left a voicemail requesting prescription refill of Plaquenil to be sent to [facility] on [address] [facility] in [place].  Patient states she scheduled an appointment at the eye doctor for her Plaquenil eye exam on 07/22/19 which was the first available.

## 2019-06-16 NOTE — Telephone Encounter (Signed)
Last Visit: 05/13/19 Next visit: 10/08/19 Labs: 02/25/19 WNL  PLQ Eye Exam: 06/24/18 WNL   Okay to refill per Dr. Estanislado Pandy

## 2019-06-17 ENCOUNTER — Telehealth (HOSPITAL_COMMUNITY): Payer: Self-pay | Admitting: *Deleted

## 2019-06-17 DIAGNOSIS — F411 Generalized anxiety disorder: Secondary | ICD-10-CM

## 2019-06-17 NOTE — Telephone Encounter (Signed)
Pt asking for refill of her Klonopin stating that she is "all out". Please review and advise.

## 2019-06-18 MED ORDER — CLONAZEPAM 0.5 MG PO TABS: ORAL_TABLET | ORAL | 3 refills | Status: DC

## 2019-06-18 NOTE — Addendum Note (Signed)
Addended by: Norma Fredrickson on: 06/18/2019 01:34 PM   Modules accepted: Orders

## 2019-06-18 NOTE — Addendum Note (Signed)
Addended by: Norma Fredrickson on: 06/18/2019 04:28 PM   Modules accepted: Orders

## 2019-06-19 ENCOUNTER — Other Ambulatory Visit: Payer: Self-pay

## 2019-06-19 MED ORDER — TIZANIDINE HCL 4 MG PO TABS: 4.0000 mg | ORAL_TABLET | Freq: Three times a day (TID) | ORAL | 2 refills | Status: DC | PRN

## 2019-06-23 ENCOUNTER — Inpatient Hospital Stay: Payer: Medicare HMO | Attending: Oncology

## 2019-06-23 ENCOUNTER — Other Ambulatory Visit: Payer: Self-pay

## 2019-06-23 ENCOUNTER — Encounter: Payer: Self-pay | Admitting: Oncology

## 2019-06-23 ENCOUNTER — Inpatient Hospital Stay (HOSPITAL_BASED_OUTPATIENT_CLINIC_OR_DEPARTMENT_OTHER): Payer: Medicare HMO | Admitting: Oncology

## 2019-06-23 ENCOUNTER — Inpatient Hospital Stay: Payer: Medicare HMO

## 2019-06-23 VITALS — BP 134/85 | HR 78 | Temp 96.9°F | Wt 335.0 lb

## 2019-06-23 DIAGNOSIS — F319 Bipolar disorder, unspecified: Secondary | ICD-10-CM | POA: Diagnosis not present

## 2019-06-23 DIAGNOSIS — E538 Deficiency of other specified B group vitamins: Secondary | ICD-10-CM | POA: Diagnosis not present

## 2019-06-23 DIAGNOSIS — K9589 Other complications of other bariatric procedure: Secondary | ICD-10-CM

## 2019-06-23 DIAGNOSIS — F419 Anxiety disorder, unspecified: Secondary | ICD-10-CM | POA: Insufficient documentation

## 2019-06-23 DIAGNOSIS — K760 Fatty (change of) liver, not elsewhere classified: Secondary | ICD-10-CM | POA: Insufficient documentation

## 2019-06-23 DIAGNOSIS — D509 Iron deficiency anemia, unspecified: Secondary | ICD-10-CM | POA: Insufficient documentation

## 2019-06-23 DIAGNOSIS — M069 Rheumatoid arthritis, unspecified: Secondary | ICD-10-CM | POA: Insufficient documentation

## 2019-06-23 DIAGNOSIS — M797 Fibromyalgia: Secondary | ICD-10-CM | POA: Insufficient documentation

## 2019-06-23 DIAGNOSIS — Z9884 Bariatric surgery status: Secondary | ICD-10-CM | POA: Diagnosis not present

## 2019-06-23 DIAGNOSIS — R69 Illness, unspecified: Secondary | ICD-10-CM | POA: Diagnosis not present

## 2019-06-23 DIAGNOSIS — M810 Age-related osteoporosis without current pathological fracture: Secondary | ICD-10-CM | POA: Diagnosis not present

## 2019-06-23 DIAGNOSIS — Z7982 Long term (current) use of aspirin: Secondary | ICD-10-CM | POA: Diagnosis not present

## 2019-06-23 DIAGNOSIS — K219 Gastro-esophageal reflux disease without esophagitis: Secondary | ICD-10-CM | POA: Insufficient documentation

## 2019-06-23 DIAGNOSIS — Z79899 Other long term (current) drug therapy: Secondary | ICD-10-CM | POA: Diagnosis not present

## 2019-06-23 DIAGNOSIS — M1990 Unspecified osteoarthritis, unspecified site: Secondary | ICD-10-CM | POA: Diagnosis not present

## 2019-06-23 DIAGNOSIS — E039 Hypothyroidism, unspecified: Secondary | ICD-10-CM | POA: Diagnosis not present

## 2019-06-23 DIAGNOSIS — D508 Other iron deficiency anemias: Secondary | ICD-10-CM

## 2019-06-23 LAB — CBC
HCT: 42.2 % (ref 36.0–46.0)
Hemoglobin: 13.3 g/dL (ref 12.0–15.0)
MCH: 30 pg (ref 26.0–34.0)
MCHC: 31.5 g/dL (ref 30.0–36.0)
MCV: 95 fL (ref 80.0–100.0)
Platelets: 251 10*3/uL (ref 150–400)
RBC: 4.44 MIL/uL (ref 3.87–5.11)
RDW: 12.5 % (ref 11.5–15.5)
WBC: 5.3 10*3/uL (ref 4.0–10.5)
nRBC: 0 % (ref 0.0–0.2)

## 2019-06-23 LAB — FERRITIN: Ferritin: 126 ng/mL (ref 11–307)

## 2019-06-23 LAB — IRON AND TIBC
Iron: 89 ug/dL (ref 28–170)
Saturation Ratios: 22 % (ref 10.4–31.8)
TIBC: 410 ug/dL (ref 250–450)
UIBC: 321 ug/dL

## 2019-06-23 MED ORDER — CYANOCOBALAMIN 1000 MCG/ML IJ SOLN
1000.0000 ug | Freq: Once | INTRAMUSCULAR | Status: AC
  Administered 2019-06-23: 1000 ug via INTRAMUSCULAR
  Filled 2019-06-23: qty 1

## 2019-06-23 NOTE — Progress Notes (Signed)
Patient stated that she had been doing well. 

## 2019-06-24 NOTE — Progress Notes (Signed)
Hematology/Oncology Consult note [facility]  Telephone:([phone] [phone]  Patient Care Team: Ronnell Freshwater, NP as PCP - General (Family Medicine)   Name of the patient: Jamie Bennett  [id number]  30-Sep-1960   Date of visit: 06/24/19  Diagnosis-iron B12 and deficiency anemia  Chief complaint/ Reason for visit-routine follow-up of anemia  Heme/Onc history: Patient is a 58 year old female with history of gastric bypass surgery in 2003.  She is subsequent developed iron and B12 deficiency and has been getting IV iron from time to time.  She was previously seen Dr. Mike Gip and transferring her care to me  Interval history-patient feels well and denies any specific complaints at this time.  Denies any blood in her stool or urine.  ECOG PS- 0 Pain scale- 0 Opioid associated constipation- no  Review of systems- Review of Systems  Constitutional: Negative for chills, fever, malaise/fatigue and weight loss.  HENT: Negative for congestion, ear discharge and nosebleeds.   Eyes: Negative for blurred vision.  Respiratory: Negative for cough, hemoptysis, sputum production, shortness of breath and wheezing.   Cardiovascular: Negative for chest pain, palpitations, orthopnea and claudication.  Gastrointestinal: Negative for abdominal pain, blood in stool, constipation, diarrhea, heartburn, melena, nausea and vomiting.  Genitourinary: Negative for dysuria, flank pain, frequency, hematuria and urgency.  Musculoskeletal: Negative for back pain, joint pain and myalgias.  Skin: Negative for rash.  Neurological: Negative for dizziness, tingling, focal weakness, seizures, weakness and headaches.  Endo/Heme/Allergies: Does not bruise/bleed easily.  Psychiatric/Behavioral: Negative for depression and suicidal ideas. The patient does not have insomnia.        Allergies  Allergen Reactions  . Lactose Intolerance (Gi) Other (See Comments)   Bloating and GI distress  . Sulfa Antibiotics Hives     Past Medical History:  Diagnosis Date  . Anemia   . Anxiety   . Bipolar 1 disorder ([place])   . Collagen vascular disease ([place])    rhematoid arthritis  . Constipation   . Fibromyalgia   . GERD (gastroesophageal reflux disease)   . Heart murmur    mild-asymptomatic  . Hepatic steatosis   . History of Roux-en-Y gastric bypass   . Hypotension   . Hypothyroidism   . Opioid abuse ([place])   . Osteoarthritis   . Osteoporosis   . PONV (postoperative nausea and vomiting)    nausea only  . Rheumatic fever   . Rheumatoid arthritis ([place])   . Thyroid disease      Past Surgical History:  Procedure Laterality Date  . CHOLECYSTECTOMY  2004  . COLONOSCOPY N/A 07/16/2017   Procedure: COLONOSCOPY;  Surgeon: Manya Silvas, MD;  Location: [name] ENDOSCOPY;  Service: Endoscopy;  Laterality: N/A;  . EXPLORATORY LAPAROTOMY  2009  . GASTRIC BYPASS  2003   [place] N/A 03/01/2018   Procedure: EXPLORATORY LAPAROTOMY/ UMBILECTOMY;  Surgeon: Robert Bellow, MD;  Location: [facility] ORS;  Service: General;  Laterality: N/A;  . TONSILLECTOMY    . VENTRAL HERNIA REPAIR N/A 03/07/2018   Procedure: HERNIA REPAIR VENTRAL ADULT;  Surgeon: Robert Bellow, MD;  Location: [facility] ORS;  Service: General;  Laterality: N/A;    Social History   Socioeconomic History  . Marital status: Married    Spouse name: Not on file  . Number of children: Not on file  . Years of education: Not on file  . Highest education level: Not on file  Occupational History  . Not on file  Social Needs  . Financial resource strain: Not on file  . Food insecurity    Worry: Not on file    Inability: Not on file  . Transportation needs    Medical: Not on file    Non-medical: Not on file  Tobacco Use  . Smoking status: Never Smoker  . Smokeless tobacco: Never Used  Substance and Sexual Activity  . Alcohol use: No    Alcohol/week: 0.0 standard drinks   . Drug use: No  . Sexual activity: Yes  Lifestyle  . Physical activity    Days per week: Not on file    Minutes per session: Not on file  . Stress: Not on file  Relationships  . Social Herbalist on phone: Not on file    Gets together: Not on file    Attends religious service: Not on file    Active member of club or organization: Not on file    Attends meetings of clubs or organizations: Not on file    Relationship status: Not on file  . Intimate partner violence    Fear of current or ex partner: Not on file    Emotionally abused: Not on file    Physically abused: Not on file    Forced sexual activity: Not on file  Other Topics Concern  . Not on file  Social History Narrative  . Not on file    Family History  Problem Relation Age of Onset  . Depression Mother   . Dementia Mother   . Heart disease Mother   . Osteoporosis Mother   . Parkinson's disease Father   . Colon cancer Neg Hx      Current Outpatient Medications:  .  albuterol (VENTOLIN HFA) 108 (90 Base) MCG/ACT inhaler, Inhale 2 puffs into the lungs every 6 (six) hours as needed for wheezing or shortness of breath., Disp: 1 Inhaler, Rfl: 3 .  aspirin EC 81 MG tablet, Take 81 mg by mouth daily., Disp: , Rfl:  .  busPIRone (BUSPAR) 30 MG tablet, 1 bid, Disp: 180 tablet, Rfl: 1 .  Cholecalciferol (HM VITAMIN D3) 4000 units CAPS, Take 4,000 Units by mouth daily. , Disp: , Rfl:  .  clonazePAM (KLONOPIN) 0.5 MG tablet, Take one tablet by mouth for acute anxiety, EMERGENCIES only - do not exceed 1 tab daily, Disp: 30 tablet, Rfl: 3 .  Coenzyme Q-10 200 MG CAPS, Take 200 mg by mouth 2 (two) times daily. , Disp: , Rfl:  .  cyanocobalamin (,VITAMIN B-12,) 1000 MCG/ML injection, Inject 1,000 mcg into the muscle every 30 (thirty) days., Disp: , Rfl:  .  desipramine (NORPRAMIN) 25 MG tablet, 1  qam, Disp: 90 tablet, Rfl: 3 .  esomeprazole (NEXIUM) 20 MG capsule, Take 20 mg by mouth daily. , Disp: , Rfl:  .  Fiber,  Guar Gum, CHEW, Chew 5 mg by mouth daily. , Disp: , Rfl:  .  hydroxychloroquine (PLAQUENIL) 200 MG tablet, TAKE ONE TABLET BY MOUTH TWICE A DAY MON-FRI FOR RHEUMATOID ARTHRITIS, Disp: 120 tablet, Rfl: 0 .  hydrOXYzine (ATARAX/VISTARIL) 50 MG tablet, Take 1 tablet (50 mg total) by mouth 3 (three) times daily as needed for anxiety., Disp: 270 tablet, Rfl: 1 .  ibuprofen (ADVIL,MOTRIN) 100 MG tablet, Take 100 mg by mouth as needed for fever., Disp: , Rfl:  .  Krill Oil 500 MG CAPS, Take 500 mg by mouth daily., Disp: , Rfl:  .  lamoTRIgine (LAMICTAL) 150 MG tablet, Take  1 tablet (150 mg total) by mouth 2 (two) times daily., Disp: 180 tablet, Rfl: 1 .  levothyroxine (SYNTHROID) 112 MCG tablet, Take 1 tablet (112 mcg total) by mouth daily., Disp: 90 tablet, Rfl: 0 .  ondansetron (ZOFRAN ODT) 4 MG disintegrating tablet, Take 1 tablet (4 mg total) by mouth every 8 (eight) hours as needed for nausea or vomiting., Disp: 12 tablet, Rfl: 0 .  polyethylene glycol (MIRALAX) packet, Take 17 g by mouth daily., Disp: 14 each, Rfl: 0 .  sucralfate (CARAFATE) 1 g tablet, Take 1 tablet (1 g total) by mouth 2 (two) times daily., Disp: 180 tablet, Rfl: 3 .  tiZANidine (ZANAFLEX) 4 MG tablet, Take 1 tablet (4 mg total) by mouth 3 (three) times daily as needed., Disp: 90 tablet, Rfl: 2 .  traZODone (DESYREL) 100 MG tablet, Take 1 tablet (100 mg total) by mouth at bedtime as needed and may repeat dose one time if needed for sleep., Disp: 60 tablet, Rfl: 5 .  venlafaxine (EFFEXOR) 100 MG tablet, Take 1 tablet (100 mg total) by mouth 3 (three) times daily with meals., Disp: 90 tablet, Rfl: 4 .  zoledronic acid (RECLAST) 5 MG/100ML SOLN injection, Inject 5 mg into the vein See admin instructions. Patient takes yearly, Disp: , Rfl:  .  HYDROcodone-acetaminophen (NORCO/VICODIN) 5-325 MG tablet, Take 1-2 tablets by mouth every 6 (six) hours as needed for severe pain. (Patient not taking: Reported on 06/23/2019), Disp: 20 tablet,  Rfl: 0 .  loratadine (CLARITIN) 10 MG tablet, Take 10 mg by mouth daily as needed for allergies., Disp: , Rfl:   Physical exam:  Vitals:   06/23/19 1015  BP: 134/85  Pulse: 78  Temp: (!) 96.9 F (36.1 C)  TempSrc: Tympanic  Weight: (!) 335 lb (152 kg)   Physical Exam Constitutional:      General: She is not in acute distress. HENT:     Head: Normocephalic and atraumatic.  Eyes:     Pupils: Pupils are equal, round, and reactive to light.  Neck:     Musculoskeletal: Normal range of motion.  Cardiovascular:     Rate and Rhythm: Normal rate and regular rhythm.     Heart sounds: Normal heart sounds.  Pulmonary:     Effort: Pulmonary effort is normal.     Breath sounds: Normal breath sounds.  Abdominal:     General: Bowel sounds are normal.     Palpations: Abdomen is soft.  Skin:    General: Skin is warm and dry.  Neurological:     Mental Status: She is alert and oriented to person, place, and time.      CMP Latest Ref Rng & Units 02/25/2019  Glucose 65 - 99 mg/dL 93  BUN 7 - 25 mg/dL 17  Creatinine 0.50 - 1.05 mg/dL 0.89  Sodium 135 - 146 mmol/L 141  Potassium 3.5 - 5.3 mmol/L 4.4  Chloride 98 - 110 mmol/L 106  CO2 20 - 32 mmol/L 23  Calcium 8.6 - 10.4 mg/dL 10.2  Total Protein 6.1 - 8.1 g/dL 6.7  Total Bilirubin 0.2 - 1.2 mg/dL 0.4  Alkaline Phos 38 - 126 U/L -  AST 10 - 35 U/L 18  ALT 6 - 29 U/L 15   CBC Latest Ref Rng & Units 06/23/2019  WBC 4.0 - 10.5 K/uL 5.3  Hemoglobin 12.0 - 15.0 g/dL 13.3  Hematocrit 36.0 - 46.0 % 42.2  Platelets 150 - 400 K/uL 251      Assessment and  plan- Patient is a 58 y.o. female with iron and B12 deficiency anemia here for routine follow-up  Patient is presently not anemic and her iron studies are normal.  She does not require any iron or B12 supplementation at this time.  Repeat CBC ferritin and iron studies in 3 in 6 months and I will see her back in 6 months.  We will also check B12 level in 3 months.     Visit Diagnosis  1. Iron deficiency anemia, unspecified iron deficiency anemia type   2. Vitamin B 12 deficiency      Dr. Randa Evens, MD, MPH [facility] at [facility] [id number] 06/24/2019 10:16 AM

## 2019-06-25 ENCOUNTER — Other Ambulatory Visit: Payer: Self-pay | Admitting: Nurse Practitioner

## 2019-06-25 ENCOUNTER — Telehealth: Payer: Self-pay

## 2019-06-25 DIAGNOSIS — N390 Urinary tract infection, site not specified: Secondary | ICD-10-CM

## 2019-06-25 MED ORDER — AMOXICILLIN-POT CLAVULANATE 875-125 MG PO TABS: 1.0000 | ORAL_TABLET | Freq: Two times a day (BID) | ORAL | 0 refills | Status: DC

## 2019-06-25 NOTE — Telephone Encounter (Signed)
Pt ADVISED WE SEND AUGMENTIN TO [facility]

## 2019-06-25 NOTE — Progress Notes (Signed)
uti symptoms. Sent augmentin  875mg  bid for 10 days to [address].

## 2019-06-25 NOTE — Telephone Encounter (Signed)
uti symptoms. Sent augmentin  875mg  bid for 10 days to [address].

## 2019-06-30 DIAGNOSIS — S52025D Nondisplaced fracture of olecranon process without intraarticular extension of left ulna, subsequent encounter for closed fracture with routine healing: Secondary | ICD-10-CM | POA: Diagnosis not present

## 2019-07-17 ENCOUNTER — Other Ambulatory Visit: Payer: Self-pay

## 2019-07-17 ENCOUNTER — Ambulatory Visit: Payer: Medicare HMO | Attending: Internal Medicine

## 2019-07-17 DIAGNOSIS — Z1231 Encounter for screening mammogram for malignant neoplasm of breast: Secondary | ICD-10-CM | POA: Diagnosis not present

## 2019-07-17 DIAGNOSIS — Z20822 Contact with and (suspected) exposure to covid-19: Secondary | ICD-10-CM

## 2019-07-17 DIAGNOSIS — Z20828 Contact with and (suspected) exposure to other viral communicable diseases: Secondary | ICD-10-CM | POA: Diagnosis not present

## 2019-07-18 LAB — NOVEL CORONAVIRUS, NAA: SARS-CoV-2, NAA: NOT DETECTED

## 2019-07-21 DIAGNOSIS — M810 Age-related osteoporosis without current pathological fracture: Secondary | ICD-10-CM | POA: Diagnosis not present

## 2019-07-22 ENCOUNTER — Encounter: Payer: Self-pay | Admitting: Rheumatology

## 2019-07-22 DIAGNOSIS — Z79899 Other long term (current) drug therapy: Secondary | ICD-10-CM | POA: Diagnosis not present

## 2019-07-22 DIAGNOSIS — H524 Presbyopia: Secondary | ICD-10-CM | POA: Diagnosis not present

## 2019-07-28 ENCOUNTER — Ambulatory Visit (INDEPENDENT_AMBULATORY_CARE_PROVIDER_SITE_OTHER): Payer: Medicare HMO | Admitting: Adult Health

## 2019-07-28 ENCOUNTER — Encounter: Payer: Self-pay | Admitting: Adult Health

## 2019-07-28 ENCOUNTER — Other Ambulatory Visit: Payer: Self-pay

## 2019-07-28 VITALS — BP 132/71 | HR 80 | Temp 96.6°F | Ht 65.0 in | Wt 339.0 lb

## 2019-07-28 DIAGNOSIS — R319 Hematuria, unspecified: Secondary | ICD-10-CM

## 2019-07-28 DIAGNOSIS — N390 Urinary tract infection, site not specified: Secondary | ICD-10-CM

## 2019-07-28 DIAGNOSIS — F3160 Bipolar disorder, current episode mixed, unspecified: Secondary | ICD-10-CM | POA: Diagnosis not present

## 2019-07-28 DIAGNOSIS — R300 Dysuria: Secondary | ICD-10-CM

## 2019-07-28 DIAGNOSIS — R69 Illness, unspecified: Secondary | ICD-10-CM | POA: Diagnosis not present

## 2019-07-28 LAB — POCT URINALYSIS DIPSTICK
Bilirubin, UA: NEGATIVE
Glucose, UA: NEGATIVE
Ketones, UA: NEGATIVE
Nitrite, UA: NEGATIVE
Protein, UA: POSITIVE — AB
Spec Grav, UA: 1.01 (ref 1.010–1.025)
Urobilinogen, UA: 0.2 E.U./dL
pH, UA: 6.5 (ref 5.0–8.0)

## 2019-07-28 MED ORDER — NITROFURANTOIN MONOHYD MACRO 100 MG PO CAPS: 100.0000 mg | ORAL_CAPSULE | Freq: Two times a day (BID) | ORAL | 0 refills | Status: DC

## 2019-07-28 NOTE — Progress Notes (Signed)
[facility] [place], [place] [address]  Internal MEDICINE  Office Visit Note  Patient Name: Jamie Bennett  [id number]  [id number]  Date of Service: 07/28/2019  Chief Complaint  Patient presents with  . Urinary Tract Infection    bloudy urine, burining sensations for about a week      HPI Pt is here for a sick visit. She reports dysuria x 1 week.  She also reports urgency, frequency and odor. She reports having UTI's in the past, and this feels similar.  She denies fever, chills, but does report some intermittent flank pain.    Current Medication:  Outpatient Encounter Medications as of 07/28/2019  Medication Sig  . albuterol (VENTOLIN HFA) 108 (90 Base) MCG/ACT inhaler Inhale 2 puffs into the lungs every 6 (six) hours as needed for wheezing or shortness of breath.  Marland Kitchen aspirin EC 81 MG tablet Take 81 mg by mouth daily.  . busPIRone (BUSPAR) 30 MG tablet 1 bid  . Cholecalciferol (HM VITAMIN D3) 4000 units CAPS Take 4,000 Units by mouth daily.   . clonazePAM (KLONOPIN) 0.5 MG tablet Take one tablet by mouth for acute anxiety, EMERGENCIES only - do not exceed 1 tab daily  . Coenzyme Q-10 200 MG CAPS Take 200 mg by mouth 2 (two) times daily.   . cyanocobalamin (,VITAMIN B-12,) 1000 MCG/ML injection Inject 1,000 mcg into the muscle every 30 (thirty) days.  Marland Kitchen desipramine (NORPRAMIN) 25 MG tablet 1  qam  . esomeprazole (NEXIUM) 20 MG capsule Take 20 mg by mouth daily.   . Fiber, Guar Gum, CHEW Chew 5 mg by mouth daily.   Marland Kitchen HYDROcodone-acetaminophen (NORCO/VICODIN) 5-325 MG tablet Take 1-2 tablets by mouth every 6 (six) hours as needed for severe pain.  . hydroxychloroquine (PLAQUENIL) 200 MG tablet TAKE ONE TABLET BY MOUTH TWICE A DAY MON-FRI FOR RHEUMATOID ARTHRITIS  . hydrOXYzine (ATARAX/VISTARIL) 50 MG tablet Take 1 tablet (50 mg total) by mouth 3 (three) times daily as needed for anxiety.  Marland Kitchen ibuprofen (ADVIL,MOTRIN) 100 MG tablet Take 100 mg by mouth  as needed for fever.  Javier Docker Oil 500 MG CAPS Take 500 mg by mouth daily.  Marland Kitchen lamoTRIgine (LAMICTAL) 150 MG tablet Take 1 tablet (150 mg total) by mouth 2 (two) times daily.  Marland Kitchen levothyroxine (SYNTHROID) 112 MCG tablet Take 1 tablet (112 mcg total) by mouth daily.  Marland Kitchen loratadine (CLARITIN) 10 MG tablet Take 10 mg by mouth daily as needed for allergies.  Marland Kitchen ondansetron (ZOFRAN ODT) 4 MG disintegrating tablet Take 1 tablet (4 mg total) by mouth every 8 (eight) hours as needed for nausea or vomiting.  . polyethylene glycol (MIRALAX) packet Take 17 g by mouth daily.  . sucralfate (CARAFATE) 1 g tablet Take 1 tablet (1 g total) by mouth 2 (two) times daily.  Marland Kitchen tiZANidine (ZANAFLEX) 4 MG tablet Take 1 tablet (4 mg total) by mouth 3 (three) times daily as needed.  . traZODone (DESYREL) 100 MG tablet Take 1 tablet (100 mg total) by mouth at bedtime as needed and may repeat dose one time if needed for sleep.  Marland Kitchen venlafaxine (EFFEXOR) 100 MG tablet Take 1 tablet (100 mg total) by mouth 3 (three) times daily with meals.  . zoledronic acid (RECLAST) 5 MG/100ML SOLN injection Inject 5 mg into the vein See admin instructions. Patient takes yearly  . [DISCONTINUED] amoxicillin-clavulanate (AUGMENTIN) 875-125 MG tablet Take 1 tablet by mouth 2 (two) times daily.   No facility-administered encounter medications  on file as of 07/28/2019.      Medical History: Past Medical History:  Diagnosis Date  . Anemia   . Anxiety   . Bipolar 1 disorder ([place])   . Collagen vascular disease ([place])    rhematoid arthritis  . Constipation   . Fibromyalgia   . GERD (gastroesophageal reflux disease)   . Heart murmur    mild-asymptomatic  . Hepatic steatosis   . History of Roux-en-Y gastric bypass   . Hypotension   . Hypothyroidism   . Opioid abuse ([place])   . Osteoarthritis   . Osteoporosis   . PONV (postoperative nausea and vomiting)    nausea only  . Rheumatic fever   . Rheumatoid arthritis ([place])   . Thyroid disease       Vital Signs: BP 132/71   Pulse 80   Temp (!) 96.6 F (35.9 C)   Ht 5\' 5"  (1.651 m)   Wt (!) 339 lb (153.8 kg)   SpO2 98%   BMI 56.41 kg/m    Review of Systems  Constitutional: Negative for chills, fatigue and unexpected weight change.  HENT: Negative for congestion, rhinorrhea, sneezing and sore throat.   Eyes: Negative for photophobia, pain and redness.  Respiratory: Negative for cough, chest tightness and shortness of breath.   Cardiovascular: Negative for chest pain and palpitations.  Gastrointestinal: Negative for abdominal pain, constipation, diarrhea, nausea and vomiting.  Endocrine: Negative.   Genitourinary: Positive for dysuria, flank pain, frequency and urgency.  Musculoskeletal: Negative for arthralgias, back pain, joint swelling and neck pain.  Skin: Negative for rash.  Allergic/Immunologic: Negative.   Neurological: Negative for tremors and numbness.  Hematological: Negative for adenopathy. Does not bruise/bleed easily.  Psychiatric/Behavioral: Negative for behavioral problems and sleep disturbance. The patient is not nervous/anxious.     Physical Exam Vitals and nursing note reviewed.  Constitutional:      General: She is not in acute distress.    Appearance: She is well-developed. She is not diaphoretic.  HENT:     Head: Normocephalic and atraumatic.     Mouth/Throat:     Pharynx: No oropharyngeal exudate.  Eyes:     Pupils: Pupils are equal, round, and reactive to light.  Neck:     Thyroid: No thyromegaly.     Vascular: No JVD.     Trachea: No tracheal deviation.  Cardiovascular:     Rate and Rhythm: Normal rate and regular rhythm.     Heart sounds: Normal heart sounds. No murmur. No friction rub. No gallop.   Pulmonary:     Effort: Pulmonary effort is normal. No respiratory distress.     Breath sounds: Normal breath sounds. No wheezing or rales.  Chest:     Chest wall: No tenderness.  Abdominal:     Palpations: Abdomen is soft.      Tenderness: There is no abdominal tenderness. There is no guarding.  Musculoskeletal:        General: Normal range of motion.     Cervical back: Normal range of motion and neck supple.  Lymphadenopathy:     Cervical: No cervical adenopathy.  Skin:    General: Skin is warm and dry.  Neurological:     Mental Status: She is alert and oriented to person, place, and time.     Cranial Nerves: No cranial nerve deficit.  Psychiatric:        Behavior: Behavior normal.        Thought Content: Thought content normal.  Judgment: Judgment normal.    Assessment/Plan: 1. Urinary tract infection with hematuria, site unspecified Advised patient to take entire course of antibiotics as prescribed with food. Pt should return to clinic in 7-10 days if symptoms fail to improve or new symptoms develop.  - urine culture comprehensive - nitrofurantoin, macrocrystal-monohydrate, (MACROBID) 100 MG capsule; Take 1 capsule (100 mg total) by mouth 2 (two) times daily for 7 days.  Dispense: 14 capsule; Refill: 0  2. Dysuria Blood, leukocytes and protein present.  - POCT urinalysis dipstick  3. Bipolar affective disorder, current episode mixed, current episode severity unspecified ([place]) Stable, continue present management.   General Counseling: shevelle halberg understanding of the findings of todays visit and agrees with plan of treatment. I have discussed any further diagnostic evaluation that may be needed or ordered today. We also reviewed her medications today. she has been encouraged to call the office with any questions or concerns that should arise related to todays visit.   Orders Placed This Encounter  Procedures  . urine culture comprehensive  . POCT urinalysis dipstick    No orders of the defined types were placed in this encounter.   Time spent: 15 Minutes  This patient was seen by Orson Gear AGNP-C in Collaboration with Dr Lavera Guise as a part of collaborative care  agreement.  Kendell Bane AGNP-C Internal Medicine

## 2019-07-31 ENCOUNTER — Telehealth: Payer: Self-pay

## 2019-07-31 ENCOUNTER — Other Ambulatory Visit: Payer: Self-pay | Admitting: Adult Health

## 2019-07-31 MED ORDER — AMOXICILLIN-POT CLAVULANATE 875-125 MG PO TABS: 1.0000 | ORAL_TABLET | Freq: Two times a day (BID) | ORAL | 0 refills | Status: DC

## 2019-07-31 MED ORDER — CIPROFLOXACIN HCL 500 MG PO TABS: 500.0000 mg | ORAL_TABLET | Freq: Two times a day (BID) | ORAL | 0 refills | Status: DC

## 2019-07-31 NOTE — Progress Notes (Signed)
More culture results available.  Canceled Augmentin, sent Cipro.

## 2019-07-31 NOTE — Telephone Encounter (Signed)
Pt advised stopped macrobid and start augmentin we send to [facility]

## 2019-07-31 NOTE — Progress Notes (Signed)
Urine culture shows patient UTI is not sensitive to Macrobid, changed to Augmentin and sent to pharmacy.

## 2019-07-31 NOTE — Telephone Encounter (Signed)
lmom to call us back

## 2019-07-31 NOTE — Telephone Encounter (Signed)
PT ADVISED WE SEND CIPRO TO [facility] PLEASE DON'T PICK UP AUGMENTIN

## 2019-07-31 NOTE — Telephone Encounter (Signed)
-----   Message from Kendell Bane, NP sent at 07/31/2019  8:49 AM EST ----- Please call patient and have her stop the macrobid, and I'm sending augmentin for her to start.  As always take with food. ----- Message ----- From: Corlis Hove Sent: 07/28/2019   3:37 PM EST To: Kendell Bane, NP

## 2019-08-01 LAB — CULTURE, URINE COMPREHENSIVE

## 2019-08-04 ENCOUNTER — Other Ambulatory Visit: Payer: Self-pay

## 2019-08-04 ENCOUNTER — Telehealth: Payer: Self-pay

## 2019-08-04 MED ORDER — LEVOTHYROXINE SODIUM 112 MCG PO TABS: 112.0000 ug | ORAL_TABLET | Freq: Every day | ORAL | 1 refills | Status: DC

## 2019-08-04 NOTE — Telephone Encounter (Signed)
Spoke with [facility] as per adam hold tizanidine and also advised pt hold  Tizanidine for one week when she is on cipro

## 2019-08-13 ENCOUNTER — Telehealth: Payer: Self-pay | Admitting: Rheumatology

## 2019-08-13 NOTE — Telephone Encounter (Signed)
Patient states [facility] on [address] in [place] was to send over a request for refill on Plaquenil. Patient wanted to make sure we had request.

## 2019-08-13 NOTE — Progress Notes (Signed)
Office Visit Note  Patient: Jamie Bennett             Date of Birth: Aug 13, 1960           MRN: [id number]             PCP: Ronnell Freshwater, NP Referring: Ronnell Freshwater, NP Visit Date: 08/15/2019 Occupation: @GUAROCC @  Subjective:  Pain in both knee joints   History of Present Illness: Jamie Bennett is a 59 y.o. female with history of seropositive rheumatoid arthritis, DDD, and osteoarthritis. She is taking plaquenil 200 mg 1 tablet by mouth twice daily M-F.  She has not had any recent rheumatoid arthritis flares.  She has been experiencing increased pain in both knee joints for the past 1.5 weeks.  She is experiencing increased pain and inflammation in the right 3rd PIP joint. She has occasional trapezius muscle tension and tenderness.     Activities of Daily Living:  Patient reports morning stiffness for 1.5  hours.   Patient Denies nocturnal pain.  Difficulty dressing/grooming: Denies Difficulty climbing stairs: Reports Difficulty getting out of chair: Reports Difficulty using hands for taps, buttons, cutlery, and/or writing: Denies  Review of Systems  Constitutional: Positive for fatigue.  HENT: Negative for mouth sores, mouth dryness and nose dryness.   Eyes: Negative for pain, visual disturbance and dryness.  Respiratory: Negative for cough, hemoptysis, shortness of breath and difficulty breathing.   Cardiovascular: Negative for chest pain, palpitations, hypertension and swelling in legs/feet.  Gastrointestinal: Negative for blood in stool, constipation and diarrhea.  Endocrine: Negative for increased urination.  Genitourinary: Negative for painful urination.  Musculoskeletal: Positive for arthralgias, joint pain, joint swelling and morning stiffness. Negative for myalgias, muscle weakness, muscle tenderness and myalgias.  Skin: Negative for color change, pallor, rash, hair loss, nodules/bumps, skin tightness, ulcers and sensitivity to sunlight.    Allergic/Immunologic: Negative for susceptible to infections.  Neurological: Positive for weakness. Negative for dizziness, numbness and headaches.  Hematological: Positive for bruising/bleeding tendency. Negative for swollen glands.  Psychiatric/Behavioral: Negative for depressed mood and sleep disturbance. The patient is not nervous/anxious.     PMFS History:  Patient Active Problem List   Diagnosis Date Noted  . Encounter for general adult medical examination with abnormal findings 06/15/2019  . Closed fracture of left elbow 06/15/2019  . Urinary tract infection with hematuria 06/15/2019  . Encounter for long-term (current) use of medications 06/15/2019  . Acute upper respiratory infection 06/06/2019  . Exposure to COVID-19 virus 06/06/2019  . Exercise-induced asthma 12/15/2018  . Screening for breast cancer 05/29/2018  . Duodenal ulcer disease 05/29/2018  . Screening for malignant neoplasm of cervix 05/29/2018  . Dysuria 05/29/2018  . Surgical wound dehiscence, initial encounter 04/14/2018  . Postoperative abdominal hernia with obstruction   . Incarcerated ventral hernia 03/22/2018  . SBO (small bowel obstruction) ([place]) 03/07/2018  . Abscess of abdominal wall 03/01/2018  . Persistent umbilical sinus [id number]  . Tinea pedis of both feet 01/23/2018  . Atopic dermatitis 12/05/2017  . Adjustment disorder with mixed anxiety and depressed mood 03/10/2017  . Major depressive disorder 03/10/2017  . Primary osteoarthritis of both knees 01/19/2017  . Suicide attempt ([place]) 07/10/2016  . Fibromyalgia 07/10/2016  . High risk medication use 07/10/2016  . AKI (acute kidney injury) ([place]) 11/09/2015  . Elevated troponin 11/09/2015  . Hypotension 11/09/2015  . Respiratory failure ([place])   . Acute hepatic failure 11/08/2015  . Drug overdose 11/08/2015  . Fatty infiltration of liver  02/16/2015  . Hepatic fibrosis 02/16/2015  . Abnormal serum level of alkaline phosphatase 02/15/2015  .  Iron deficiency anemia 02/05/2015  . Vitamin B 12 deficiency 02/05/2015  . OP (osteoporosis) 06/16/2014  . Rheumatoid arteritis ([place]) 03/02/2014  . Hypothyroidism 03/02/2014  . Rheumatic fever without heart involvement 03/02/2014  . Adult hypothyroidism 03/02/2014  . Arthritis of pelvic region, degenerative 03/02/2014  . Bipolar 1 disorder, depressed ([place]) 02/26/2014  . Bariatric surgery status 11/24/2013  . Affective bipolar disorder ([place]) 11/24/2013  . Bipolar affective disorder ([place]) 11/24/2013  . Rheumatoid arthritis ([place]) 11/24/2013  . Polysubstance (excluding opioids) dependence ([place]) 09/11/2013  . Polysubstance dependence ([place]) 09/11/2013  . Combined drug dependence excluding opioids ([place]) 09/11/2013  . Arthritis or polyarthritis, rheumatoid ([place]) 09/05/2013  . Leg weakness 09/05/2013    Past Medical History:  Diagnosis Date  . Anemia   . Anxiety   . Bipolar 1 disorder ([place])   . Collagen vascular disease ([place])    rhematoid arthritis  . Constipation   . Fibromyalgia   . GERD (gastroesophageal reflux disease)   . Heart murmur    mild-asymptomatic  . Hepatic steatosis   . History of Roux-en-Y gastric bypass   . Hypotension   . Hypothyroidism   . Opioid abuse ([place])   . Osteoarthritis   . Osteoporosis   . PONV (postoperative nausea and vomiting)    nausea only  . Rheumatic fever   . Rheumatoid arthritis ([place])   . Thyroid disease     Family History  Problem Relation Age of Onset  . Depression Mother   . Dementia Mother   . Heart disease Mother   . Osteoporosis Mother   . Parkinson's disease Father   . Colon cancer Neg Hx    Past Surgical History:  Procedure Laterality Date  . CHOLECYSTECTOMY  2004  . COLONOSCOPY N/A 07/16/2017   Procedure: COLONOSCOPY;  Surgeon: Manya Silvas, MD;  Location: [facility] ENDOSCOPY;  Service: Endoscopy;  Laterality: N/A;  . EXPLORATORY LAPAROTOMY  2009  . GASTRIC BYPASS  2003   [place] N/A 03/01/2018    Procedure: EXPLORATORY LAPAROTOMY/ UMBILECTOMY;  Surgeon: Robert Bellow, MD;  Location: [facility] ORS;  Service: General;  Laterality: N/A;  . TONSILLECTOMY    . VENTRAL HERNIA REPAIR N/A 03/07/2018   Procedure: HERNIA REPAIR VENTRAL ADULT;  Surgeon: Robert Bellow, MD;  Location: [facility] ORS;  Service: General;  Laterality: N/A;   Social History   Social History Narrative  . Not on file   Immunization History  Administered Date(s) Administered  . Influenza,inj,Quad PF,6+ Mos 04/24/2016  . Pneumococcal Polysaccharide-23 03/02/2018     Objective: Vital Signs: BP 125/83 (BP Location: Left Wrist, Patient Position: Sitting, Cuff Size: Normal)   Pulse 77   Resp 16   Ht 5\' 5"  (1.651 m)   Wt (!) 342 lb 9.6 oz (155.4 kg)   BMI 57.01 kg/m    Physical Exam Vitals and nursing note reviewed.  Constitutional:      Appearance: She is well-developed.  HENT:     Head: Normocephalic and atraumatic.  Eyes:     Conjunctiva/sclera: Conjunctivae normal.  Cardiovascular:     Rate and Rhythm: Normal rate and regular rhythm.     Heart sounds: Normal heart sounds.  Pulmonary:     Effort: Pulmonary effort is normal.     Breath sounds: Normal breath sounds.  Abdominal:     General: Bowel sounds are normal.     Palpations:  Abdomen is soft.  Musculoskeletal:     Cervical back: Normal range of motion.  Lymphadenopathy:     Cervical: No cervical adenopathy.  Skin:    General: Skin is warm and dry.     Capillary Refill: Capillary refill takes less than 2 seconds.  Neurological:     Mental Status: She is alert and oriented to person, place, and time.  Psychiatric:        Behavior: Behavior normal.      Musculoskeletal Exam: C-spine, thoracic spine, and lumbar spine good ROM.  No midline spinal tenderness.  No SI joint tenderness.  Shoulder joints, elbow joints, wrist joints, MCPs, PIPs, and DIPs good ROM with no synovitis. PIP and DIP synovial thickening consistent with osteoarthritis of both  hands.  Hip joints, knee joints, and ankle joints good ROM with no discomfort.  No warmth or effusion of knee joints.  No tenderness or swelling of ankle joints.   CDAI Exam: CDAI Score: 1.9  Patient Global: 3 mm; Provider Global: 6 mm Swollen: 0 ; Tender: 1  Joint Exam 08/15/2019      Right  Left  PIP 3   Tender        Investigation: No additional findings.  Imaging: No results found.  Recent Labs: Lab Results  Component Value Date   WBC 5.3 06/23/2019   HGB 13.3 06/23/2019   PLT 251 06/23/2019   NA 141 02/25/2019   K 4.4 02/25/2019   CL 106 02/25/2019   CO2 23 02/25/2019   GLUCOSE 93 02/25/2019   BUN 17 02/25/2019   CREATININE 0.89 02/25/2019   BILITOT 0.4 02/25/2019   ALKPHOS 218 (H) 03/07/2018   AST 18 02/25/2019   ALT 15 02/25/2019   PROT 6.7 02/25/2019   ALBUMIN 3.9 03/07/2018   CALCIUM 10.2 02/25/2019   GFRAA 83 02/25/2019    Speciality Comments: PLQ Eye Exam: 07/22/2019 WNL @ [name] follow up in 6 months.  Procedures:  Large Joint Inj: bilateral knee on 08/15/2019 8:22 AM Indications: pain Details: 27 G 1.5 in needle, medial approach  Arthrogram: No  Medications (Right): 1.5 mL lidocaine 1 %; 40 mg triamcinolone acetonide 40 MG/ML Medications (Left): 1.5 mL lidocaine 1 %; 40 mg triamcinolone acetonide 40 MG/ML Outcome: tolerated well, no immediate complications Procedure, treatment alternatives, risks and benefits explained, specific risks discussed. Consent was given by the patient. Immediately prior to procedure a time out was called to verify the correct patient, procedure, equipment, support staff and site/side marked as required. Patient was prepped and draped in the usual sterile fashion.     Allergies: Lactose intolerance (gi) and Sulfa antibiotics   Assessment / Plan:     Visit Diagnoses: Rheumatoid arthritis involving multiple sites with positive rheumatoid factor ([place]): She has not had any recent rheumatoid arthritis flares.   She is clinically doing well on Plaquenil 200 mg 1 tablet by mouth twice daily M-F.  She has not missed any doses recently.  She is experiencing tenderness in the right 3rd PIP but no synovitis was noted.  She has chronic pain in both knee joints but no warmth or effusion was noted.  She will continue taking plaquenil as prescribed.  She was advised to notify us if she develops increased joint pain or joint swelling. She will follow up in 5 months.   High risk medication use - Plaquenil 200 mg 1 tablet twice daily Monday-Friday only.  Last Plaquenil eye exam normal on 07/22/2019.   Standing orders for  CMP placed today.  She is overdue to update CMP.- Plan: COMPLETE METABOLIC PANEL WITH GFR, CMP  Fibromyalgia: Her fibromyalgia discomfort has been manageable.  She takes zanaflex 4 mg po at bedtime for muscle spasms.    Trapezius muscle spasm: She has intermittent trapezius muscle tension and tenderness.  She has intermittent muscle spasms.  She takes zanaflex 4 mg po at bedtime for muscle spasms.   Primary osteoarthritis of both knees: She has been experiencing increased pain in both knee joints for the past 1.5 weeks.  She has good ROM with no warmth or effusion of exam.  She requested bilateral cortisone injections. She tolerated the procedure well.  Procedure note completed above. Aftercare was discussed.   DDD (degenerative disc disease), cervical: She has occasional neck stiffness but no symptoms of radiculopathy.   DDD (degenerative disc disease), thoracic: Postural thoracic kyphosis noted.  DDD (degenerative disc disease), lumbar: She has intermittent lower back pain.   Other osteoporosis without current pathological fracture: She received her first Prolia injection in December 2020.    Other medical conditions are listed as follows:   History of anemia  History of gastroesophageal reflux (GERD)  History of bipolar disorder  History of hypothyroidism  History of suicide  attempt  Orders: Orders Placed This Encounter  Procedures  . Large Joint Inj  . COMPLETE METABOLIC PANEL WITH GFR  . CMP   No orders of the defined types were placed in this encounter.    Follow-Up Instructions: Return in about 5 months (around 01/13/2020) for Rheumatoid arthritis, Osteoarthritis, DDD.   Bo Merino, MD   Scribed by-  Hazel Sams, PA-C  Note - This record has been created using Dragon software.  Chart creation errors have been sought, but may not always  have been located. Such creation errors do not reflect on  the standard of medical care.

## 2019-08-13 NOTE — Telephone Encounter (Signed)
Patient advised we have not received a refill request for PLQ. Patient advised that we sent in a prescription for PLQ in Nov. 2020 and it should not be due for refill until Feb. 2021. Patient states she has update PLQ eye exam and will bring results to office.

## 2019-08-15 ENCOUNTER — Ambulatory Visit: Payer: Medicare HMO | Admitting: Rheumatology

## 2019-08-15 ENCOUNTER — Encounter: Payer: Self-pay | Admitting: Rheumatology

## 2019-08-15 ENCOUNTER — Other Ambulatory Visit: Payer: Self-pay

## 2019-08-15 VITALS — BP 125/83 | HR 77 | Resp 16 | Ht 65.0 in | Wt 342.6 lb

## 2019-08-15 DIAGNOSIS — M25561 Pain in right knee: Secondary | ICD-10-CM

## 2019-08-15 DIAGNOSIS — M25562 Pain in left knee: Secondary | ICD-10-CM

## 2019-08-15 DIAGNOSIS — Z8719 Personal history of other diseases of the digestive system: Secondary | ICD-10-CM

## 2019-08-15 DIAGNOSIS — Z862 Personal history of diseases of the blood and blood-forming organs and certain disorders involving the immune mechanism: Secondary | ICD-10-CM

## 2019-08-15 DIAGNOSIS — M5136 Other intervertebral disc degeneration, lumbar region: Secondary | ICD-10-CM | POA: Diagnosis not present

## 2019-08-15 DIAGNOSIS — Z8639 Personal history of other endocrine, nutritional and metabolic disease: Secondary | ICD-10-CM

## 2019-08-15 DIAGNOSIS — M5134 Other intervertebral disc degeneration, thoracic region: Secondary | ICD-10-CM | POA: Diagnosis not present

## 2019-08-15 DIAGNOSIS — M51369 Other intervertebral disc degeneration, lumbar region without mention of lumbar back pain or lower extremity pain: Secondary | ICD-10-CM

## 2019-08-15 DIAGNOSIS — Z915 Personal history of self-harm: Secondary | ICD-10-CM

## 2019-08-15 DIAGNOSIS — Z8659 Personal history of other mental and behavioral disorders: Secondary | ICD-10-CM

## 2019-08-15 DIAGNOSIS — M0579 Rheumatoid arthritis with rheumatoid factor of multiple sites without organ or systems involvement: Secondary | ICD-10-CM

## 2019-08-15 DIAGNOSIS — G8929 Other chronic pain: Secondary | ICD-10-CM

## 2019-08-15 DIAGNOSIS — M170 Bilateral primary osteoarthritis of knee: Secondary | ICD-10-CM

## 2019-08-15 DIAGNOSIS — Z79899 Other long term (current) drug therapy: Secondary | ICD-10-CM | POA: Diagnosis not present

## 2019-08-15 DIAGNOSIS — M797 Fibromyalgia: Secondary | ICD-10-CM | POA: Diagnosis not present

## 2019-08-15 DIAGNOSIS — M5030 Other cervical disc degeneration, unspecified cervical region: Secondary | ICD-10-CM | POA: Diagnosis not present

## 2019-08-15 DIAGNOSIS — M818 Other osteoporosis without current pathological fracture: Secondary | ICD-10-CM

## 2019-08-15 DIAGNOSIS — M62838 Other muscle spasm: Secondary | ICD-10-CM

## 2019-08-15 DIAGNOSIS — Z9151 Personal history of suicidal behavior: Secondary | ICD-10-CM

## 2019-08-15 NOTE — Patient Instructions (Signed)
Standing Labs We placed an order today for your standing lab work.    Due for CMP.  CBC is due in February   We have open lab daily Monday through Thursday from 8:30-12:30 PM and 1:30-4:30 PM and Friday from 8:30-12:30 PM and 1:30-4:00 PM at the office of Dr. Bo Merino.   You may experience shorter wait times on Monday and Friday afternoons. The office is located at [address] [place], [place], [place] [address] No appointment is necessary.   Labs are drawn by [facility].  You may receive a bill from [facility] for your lab work.  If you wish to have your labs drawn at another location, please call the office 24 hours in advance to send orders.  If you have any questions regarding directions or hours of operation,  please call [phone].   Just as a reminder please drink plenty of water prior to coming for your lab work. Thanks!

## 2019-08-31 ENCOUNTER — Other Ambulatory Visit: Payer: Self-pay | Admitting: Rheumatology

## 2019-08-31 DIAGNOSIS — M0579 Rheumatoid arthritis with rheumatoid factor of multiple sites without organ or systems involvement: Secondary | ICD-10-CM

## 2019-09-01 NOTE — Telephone Encounter (Signed)
Ok to refill 30 day supply.  Please advise patient to update PLQ eye exam and lab work.

## 2019-09-01 NOTE — Telephone Encounter (Addendum)
Last Visit: 08/15/19 Next Visit: 01/20/20 Labs: 02/25/19 WNL Eye exam: 07/22/2019 WNL  Patient advised she is due to update labs. Patient states she is due to have labs this month with another doctor and will have a copy forwarded.  Okay to refill 30 day supply PLQ?

## 2019-09-01 NOTE — Telephone Encounter (Signed)
Upon further review, her PLQ eye exam is up to date but she will need update labs ASAP

## 2019-09-08 DIAGNOSIS — N6002 Solitary cyst of left breast: Secondary | ICD-10-CM | POA: Diagnosis not present

## 2019-09-08 DIAGNOSIS — R928 Other abnormal and inconclusive findings on diagnostic imaging of breast: Secondary | ICD-10-CM | POA: Diagnosis not present

## 2019-09-16 ENCOUNTER — Ambulatory Visit (INDEPENDENT_AMBULATORY_CARE_PROVIDER_SITE_OTHER): Payer: Medicare HMO | Admitting: Psychiatry

## 2019-09-16 ENCOUNTER — Other Ambulatory Visit: Payer: Self-pay

## 2019-09-16 DIAGNOSIS — R69 Illness, unspecified: Secondary | ICD-10-CM | POA: Diagnosis not present

## 2019-09-16 DIAGNOSIS — F325 Major depressive disorder, single episode, in full remission: Secondary | ICD-10-CM

## 2019-09-16 NOTE — Progress Notes (Signed)
`  Psychiatric Initial Adult Assessment   Patient Identification: Jamie Bennett MRN:  [id number] Date of Evaluation:  09/16/2019 Referral Source: grams per previous psychiatrist Chief Complaint:   Visit Diagnosis: borderline personality disorder No diagnosis found.  History of Present Illness:  Today the patient is actually doing fairly well.  Unfortunately a few months ago her father died of the Covid virus.  The patient did go to the funeral.  The patient denies daily depression.  She seems to be breathing fairly well.  She is sleeping and eating very well.  She is got good energy.  She takes her medicines just as prescribed.  Her mood is stable.  She denies irritability or euphoria.  She lives with her husband.  She also has a lot of contact with her son and his wife.  She also has contact with her grandson is in kindergarten.  She spends a lot of time with her grandchild.  Patient has gained some weight.  She blames it on the pandemic.  Overall though her health is good.  She is stable.  Financially she is okay.  Right elbow has healed from the fracture few months ago.  At this time the patient is not in therapy. Depression Symptoms:  fatigue, (Hypo) Manic Symptoms:   Anxiety Symptoms:   Psychotic Symptoms:   PTSD Symptoms:   Past Psychiatric History: 10 psychiatric hospitalizations multiple psychotropic medications presently in psychotherapy  Previous Psychotropic Medications: Yes   Substance Abuse History in the last 12 months:  Yes.    Consequences of Substance Abuse:   Past Medical History:  Past Medical History:  Diagnosis Date  . Anemia   . Anxiety   . Bipolar 1 disorder ([place])   . Collagen vascular disease ([place])    rhematoid arthritis  . Constipation   . Fibromyalgia   . GERD (gastroesophageal reflux disease)   . Heart murmur    mild-asymptomatic  . Hepatic steatosis   . History of Roux-en-Y gastric bypass   . Hypotension   . Hypothyroidism   . Opioid  abuse ([place])   . Osteoarthritis   . Osteoporosis   . PONV (postoperative nausea and vomiting)    nausea only  . Rheumatic fever   . Rheumatoid arthritis ([place])   . Thyroid disease     Past Surgical History:  Procedure Laterality Date  . CHOLECYSTECTOMY  2004  . COLONOSCOPY N/A 07/16/2017   Procedure: COLONOSCOPY;  Surgeon: Manya Silvas, MD;  Location: [facility] ENDOSCOPY;  Service: Endoscopy;  Laterality: N/A;  . EXPLORATORY LAPAROTOMY  2009  . GASTRIC BYPASS  2003   [place] N/A 03/01/2018   Procedure: EXPLORATORY LAPAROTOMY/ UMBILECTOMY;  Surgeon: Robert Bellow, MD;  Location: [facility] ORS;  Service: General;  Laterality: N/A;  . TONSILLECTOMY    . VENTRAL HERNIA REPAIR N/A 03/07/2018   Procedure: HERNIA REPAIR VENTRAL ADULT;  Surgeon: Robert Bellow, MD;  Location: [facility] ORS;  Service: General;  Laterality: N/A;    Family Psychiatric History:   Family History:  Family History  Problem Relation Age of Onset  . Depression Mother   . Dementia Mother   . Heart disease Mother   . Osteoporosis Mother   . Parkinson's disease Father   . Colon cancer Neg Hx     Social History:   Social History   Socioeconomic History  . Marital status: Married    Spouse name: Not on file  . Number of children: Not on file  .  Years of education: Not on file  . Highest education level: Not on file  Occupational History  . Not on file  Tobacco Use  . Smoking status: Never Smoker  . Smokeless tobacco: Never Used  Substance and Sexual Activity  . Alcohol use: No    Alcohol/week: 0.0 standard drinks  . Drug use: No  . Sexual activity: Yes  Other Topics Concern  . Not on file  Social History Narrative  . Not on file   Social Determinants of Health   Financial Resource Strain:   . Difficulty of Paying Living Expenses: Not on file  Food Insecurity:   . Worried About Charity fundraiser in the Last Year: Not on file  . Ran Out of Food in the Last Year: Not on file   Transportation Needs:   . Lack of Transportation (Medical): Not on file  . Lack of Transportation (Non-Medical): Not on file  Physical Activity:   . Days of Exercise per Week: Not on file  . Minutes of Exercise per Session: Not on file  Stress:   . Feeling of Stress : Not on file  Social Connections:   . Frequency of Communication with Friends and Family: Not on file  . Frequency of Social Gatherings with Friends and Family: Not on file  . Attends Religious Services: Not on file  . Active Member of Clubs or Organizations: Not on file  . Attends Archivist Meetings: Not on file  . Marital Status: Not on file    Additional Social History:   Allergies:   Allergies  Allergen Reactions  . Lactose Intolerance (Gi) Other (See Comments)    Bloating and GI distress  . Sulfa Antibiotics Hives    Metabolic Disorder Labs: No results found for: HGBA1C, MPG No results found for: PROLACTIN Lab Results  Component Value Date   TRIG 176 (H) 11/09/2015     Current Medications: Current Outpatient Medications  Medication Sig Dispense Refill  . hydroxychloroquine (PLAQUENIL) 200 MG tablet TAKE ONE TABLET BY MOUTH TWICE A DAY MONDAY - FRIDAY FOR RHEUMATOID ARTHRITIS 40 tablet 0  . albuterol (VENTOLIN HFA) 108 (90 Base) MCG/ACT inhaler Inhale 2 puffs into the lungs every 6 (six) hours as needed for wheezing or shortness of breath. 1 Inhaler 3  . aspirin EC 81 MG tablet Take 81 mg by mouth daily.    . busPIRone (BUSPAR) 30 MG tablet 1 bid 180 tablet 1  . Cholecalciferol (HM VITAMIN D3) 4000 units CAPS Take 4,000 Units by mouth daily.     . clonazePAM (KLONOPIN) 0.5 MG tablet Take one tablet by mouth for acute anxiety, EMERGENCIES only - do not exceed 1 tab daily 30 tablet 3  . Coenzyme Q-10 200 MG CAPS Take 200 mg by mouth 2 (two) times daily.     . cyanocobalamin (,VITAMIN B-12,) 1000 MCG/ML injection Inject 1,000 mcg into the muscle every 3 (three) months.     Marland Kitchen denosumab (PROLIA)  60 MG/ML SOSY injection Inject 60 mg into the skin every 6 (six) months.    . desipramine (NORPRAMIN) 25 MG tablet 1  qam 90 tablet 3  . esomeprazole (NEXIUM) 20 MG capsule Take 20 mg by mouth daily.     . Fiber, Guar Gum, CHEW Chew 5 mg by mouth daily.     . hydrOXYzine (ATARAX/VISTARIL) 50 MG tablet Take 1 tablet (50 mg total) by mouth 3 (three) times daily as needed for anxiety. 270 tablet 1  . ibuprofen (ADVIL,MOTRIN)  100 MG tablet Take 100 mg by mouth as needed for fever.    Javier Docker Oil 500 MG CAPS Take 500 mg by mouth daily.    Marland Kitchen lamoTRIgine (LAMICTAL) 150 MG tablet Take 1 tablet (150 mg total) by mouth 2 (two) times daily. 180 tablet 1  . levothyroxine (SYNTHROID) 112 MCG tablet Take 1 tablet (112 mcg total) by mouth daily. 90 tablet 1  . loratadine (CLARITIN) 10 MG tablet Take 10 mg by mouth daily as needed for allergies.    Marland Kitchen ondansetron (ZOFRAN ODT) 4 MG disintegrating tablet Take 1 tablet (4 mg total) by mouth every 8 (eight) hours as needed for nausea or vomiting. 12 tablet 0  . polyethylene glycol (MIRALAX) packet Take 17 g by mouth daily. 14 each 0  . sucralfate (CARAFATE) 1 g tablet Take 1 tablet (1 g total) by mouth 2 (two) times daily. 180 tablet 3  . tiZANidine (ZANAFLEX) 4 MG tablet Take 1 tablet (4 mg total) by mouth 3 (three) times daily as needed. 90 tablet 2  . traZODone (DESYREL) 100 MG tablet Take 1 tablet (100 mg total) by mouth at bedtime as needed and may repeat dose one time if needed for sleep. 60 tablet 5  . venlafaxine (EFFEXOR) 100 MG tablet Take 1 tablet (100 mg total) by mouth 3 (three) times daily with meals. 90 tablet 4   No current facility-administered medications for this visit.    Neurologic: Headache: No Seizure: No Paresthesias:NA  Musculoskeletal: Strength & Muscle Tone: within normal limits Gait & Station: normal Patient leans: N/A  Psychiatric Specialty Exam: ROS  There were no vitals taken for this visit.There is no height or weight on  file to calculate BMI.  General Appearance: Bizarre  Eye Contact:  Good  Speech:  Normal Rate  Volume:  Normal  Mood:  Anxious  Affect:  Congruent  Thought Process:  Goal Directed  Orientation:  NA  Thought Content:  Logical  Suicidal Thoughts:  No  Homicidal Thoughts:  No  Memory:  Negative  Judgement:  Fair  Insight:  NA and Good  Psychomotor Activity:  Normal  Concentration:    Recall:  Good  Fund of Knowledge:  Language: Good  Akathisia:  No  Handed:  Right  AIMS (if indicated):    Assets:  Desire for Improvement  ADL's:  Intact  Cognition: WNL  Sleep:      Treatment Plan Summary: At this time the patient is doing well.  Her first problem is that of borderline personality disorder which seems to have relatively resolved with the help of DBT.  Her second problem is that of major depression.  He takes Effexor and desipramine and does very well with it.  Her mood is stable.  She is functioning very well.  She will return under my care in 5 months. Jerral Ralph, MD 2/16/20213:21 PM

## 2019-09-22 ENCOUNTER — Other Ambulatory Visit: Payer: Self-pay

## 2019-09-22 ENCOUNTER — Inpatient Hospital Stay: Payer: Medicare HMO | Attending: Oncology

## 2019-09-22 ENCOUNTER — Inpatient Hospital Stay: Payer: Medicare HMO

## 2019-09-22 DIAGNOSIS — Z79899 Other long term (current) drug therapy: Secondary | ICD-10-CM | POA: Insufficient documentation

## 2019-09-22 DIAGNOSIS — E538 Deficiency of other specified B group vitamins: Secondary | ICD-10-CM | POA: Diagnosis present

## 2019-09-22 DIAGNOSIS — D509 Iron deficiency anemia, unspecified: Secondary | ICD-10-CM

## 2019-09-22 LAB — IRON AND TIBC
Iron: 81 ug/dL (ref 28–170)
Saturation Ratios: 21 % (ref 10.4–31.8)
TIBC: 382 ug/dL (ref 250–450)
UIBC: 301 ug/dL

## 2019-09-22 LAB — CBC
HCT: 39.7 % (ref 36.0–46.0)
Hemoglobin: 12.8 g/dL (ref 12.0–15.0)
MCH: 30.1 pg (ref 26.0–34.0)
MCHC: 32.2 g/dL (ref 30.0–36.0)
MCV: 93.4 fL (ref 80.0–100.0)
Platelets: 225 10*3/uL (ref 150–400)
RBC: 4.25 MIL/uL (ref 3.87–5.11)
RDW: 13.5 % (ref 11.5–15.5)
WBC: 5.7 10*3/uL (ref 4.0–10.5)
nRBC: 0 % (ref 0.0–0.2)

## 2019-09-22 LAB — FERRITIN: Ferritin: 84 ng/mL (ref 11–307)

## 2019-09-22 LAB — VITAMIN B12: Vitamin B-12: 1786 pg/mL — ABNORMAL HIGH (ref 180–914)

## 2019-09-22 MED ORDER — CYANOCOBALAMIN 1000 MCG/ML IJ SOLN
1000.0000 ug | Freq: Once | INTRAMUSCULAR | Status: AC
  Administered 2019-09-22: 1000 ug via INTRAMUSCULAR
  Filled 2019-09-22: qty 1

## 2019-09-23 ENCOUNTER — Other Ambulatory Visit: Payer: Self-pay

## 2019-09-23 MED ORDER — TIZANIDINE HCL 4 MG PO TABS: 4.0000 mg | ORAL_TABLET | Freq: Three times a day (TID) | ORAL | 2 refills | Status: DC | PRN

## 2019-09-24 ENCOUNTER — Telehealth: Payer: Self-pay

## 2019-09-24 NOTE — Telephone Encounter (Signed)
Rescheduled appointment on 09/30/2019 to 10/09/2019. klh

## 2019-09-25 ENCOUNTER — Ambulatory Visit (HOSPITAL_COMMUNITY): Payer: Medicare HMO | Admitting: Psychiatry

## 2019-09-30 ENCOUNTER — Telehealth: Payer: Self-pay | Admitting: Rheumatology

## 2019-09-30 ENCOUNTER — Ambulatory Visit: Payer: Medicare HMO | Admitting: Nurse Practitioner

## 2019-09-30 DIAGNOSIS — Z79899 Other long term (current) drug therapy: Secondary | ICD-10-CM

## 2019-09-30 NOTE — Telephone Encounter (Signed)
Patient called stating her last refill of Plaquenil was only 30 day supply because her labwork was missing.  Patient states she had labwork with Dr. Janese Banks last week and is requesting a 90 day supply since it is a lot less expensive.  Patient states the results should be in Epic.

## 2019-09-30 NOTE — Telephone Encounter (Signed)
Patient advised she had a CBC performed with that blood work but we will still need a CMP. Patient states she will got as soon as possible to get it done. Patient will let us know so we may give her a 90 day supply. Patient states she still has 2 weeks on medication left.

## 2019-10-02 ENCOUNTER — Telehealth: Payer: Self-pay | Admitting: Rheumatology

## 2019-10-02 DIAGNOSIS — Z79899 Other long term (current) drug therapy: Secondary | ICD-10-CM

## 2019-10-02 NOTE — Telephone Encounter (Signed)
Lab Orders released.  

## 2019-10-02 NOTE — Telephone Encounter (Signed)
Patient going to [facility] in [place] in [place]. Please release orders. FAX # [phone]

## 2019-10-03 DIAGNOSIS — Z79899 Other long term (current) drug therapy: Secondary | ICD-10-CM | POA: Diagnosis not present

## 2019-10-04 LAB — CBC WITH DIFFERENTIAL/PLATELET
Basophils Absolute: 0 x10E3/uL (ref 0.0–0.2)
Basos: 1 %
EOS (ABSOLUTE): 0.2 x10E3/uL (ref 0.0–0.4)
Eos: 5 %
Hematocrit: 38.6 % (ref 34.0–46.6)
Hemoglobin: 13.1 g/dL (ref 11.1–15.9)
Immature Grans (Abs): 0 x10E3/uL (ref 0.0–0.1)
Immature Granulocytes: 0 %
Lymphocytes Absolute: 2.2 x10E3/uL (ref 0.7–3.1)
Lymphs: 44 %
MCH: 30.8 pg (ref 26.6–33.0)
MCHC: 33.9 g/dL (ref 31.5–35.7)
MCV: 91 fL (ref 79–97)
Monocytes Absolute: 0.4 x10E3/uL (ref 0.1–0.9)
Monocytes: 9 %
Neutrophils Absolute: 2 x10E3/uL (ref 1.4–7.0)
Neutrophils: 41 %
Platelets: 226 x10E3/uL (ref 150–450)
RBC: 4.26 x10E6/uL (ref 3.77–5.28)
RDW: 12.5 % (ref 11.7–15.4)
WBC: 4.9 x10E3/uL (ref 3.4–10.8)

## 2019-10-04 LAB — CMP14+EGFR
ALT: 18 IU/L (ref 0–32)
AST: 15 IU/L (ref 0–40)
Albumin/Globulin Ratio: 1.5 (ref 1.2–2.2)
Albumin: 3.9 g/dL (ref 3.8–4.9)
Alkaline Phosphatase: 132 IU/L — ABNORMAL HIGH (ref 39–117)
BUN/Creatinine Ratio: 20 (ref 9–23)
BUN: 17 mg/dL (ref 6–24)
Bilirubin Total: 0.2 mg/dL (ref 0.0–1.2)
CO2: 20 mmol/L (ref 20–29)
Calcium: 9.5 mg/dL (ref 8.7–10.2)
Chloride: 108 mmol/L — ABNORMAL HIGH (ref 96–106)
Creatinine, Ser: 0.85 mg/dL (ref 0.57–1.00)
GFR calc Af Amer: 87 mL/min/1.73
GFR calc non Af Amer: 75 mL/min/1.73
Globulin, Total: 2.6 g/dL (ref 1.5–4.5)
Glucose: 86 mg/dL (ref 65–99)
Potassium: 3.8 mmol/L (ref 3.5–5.2)
Sodium: 144 mmol/L (ref 134–144)
Total Protein: 6.5 g/dL (ref 6.0–8.5)

## 2019-10-06 ENCOUNTER — Telehealth: Payer: Self-pay | Admitting: *Deleted

## 2019-10-06 DIAGNOSIS — M0579 Rheumatoid arthritis with rheumatoid factor of multiple sites without organ or systems involvement: Secondary | ICD-10-CM

## 2019-10-06 MED ORDER — HYDROXYCHLOROQUINE SULFATE 200 MG PO TABS: ORAL_TABLET | ORAL | 0 refills | Status: DC

## 2019-10-06 NOTE — Telephone Encounter (Signed)
CBC is normal.  Chloride and alk phos is mildly elevated.  We will continue to monitor.

## 2019-10-06 NOTE — Telephone Encounter (Signed)
-----  Message from Shona Needles, [place] sent at 10/06/2019 10:25 AM EST ----- PLQ 90 day supply Kristopher Oppenheim, [place], [place] ----- Message ----- From: Bo Merino, MD Sent: 10/06/2019   8:08 AM EST To: Cr-Rheumatology Clinical  CBC is normal.  Chloride and alk phos is mildly elevated.  We will continue to monitor.

## 2019-10-06 NOTE — Telephone Encounter (Signed)
Last Visit: 08/15/19 Next Visit: 10/08/19 Labs: 10/03/19 CBC is normal. Chloride and alk phos is mildly elevated. Eye exam: 07/22/2019 WNL  Okay to refill per Dr. Estanislado Pandy

## 2019-10-07 ENCOUNTER — Telehealth: Payer: Self-pay

## 2019-10-07 NOTE — Telephone Encounter (Signed)
CONFIRMED AND SCREENED FOR 10-09-19 OV.

## 2019-10-08 ENCOUNTER — Ambulatory Visit: Payer: Medicare HMO | Admitting: Physician Assistant

## 2019-10-09 ENCOUNTER — Ambulatory Visit: Payer: Medicare HMO | Admitting: Nurse Practitioner

## 2019-10-22 ENCOUNTER — Telehealth: Payer: Self-pay

## 2019-10-22 NOTE — Telephone Encounter (Signed)
CONFIRMED AND SCREENED FOR 10-27-19 OV.

## 2019-10-27 ENCOUNTER — Ambulatory Visit (INDEPENDENT_AMBULATORY_CARE_PROVIDER_SITE_OTHER): Payer: Medicare HMO | Admitting: Nurse Practitioner

## 2019-10-27 ENCOUNTER — Other Ambulatory Visit: Payer: Self-pay

## 2019-10-27 ENCOUNTER — Encounter: Payer: Self-pay | Admitting: Nurse Practitioner

## 2019-10-27 VITALS — BP 140/76 | HR 88 | Temp 97.4°F | Resp 16 | Ht 65.0 in | Wt 332.0 lb

## 2019-10-27 DIAGNOSIS — E039 Hypothyroidism, unspecified: Secondary | ICD-10-CM

## 2019-10-27 DIAGNOSIS — F3160 Bipolar disorder, current episode mixed, unspecified: Secondary | ICD-10-CM

## 2019-10-27 DIAGNOSIS — B354 Tinea corporis: Secondary | ICD-10-CM | POA: Diagnosis not present

## 2019-10-27 DIAGNOSIS — R69 Illness, unspecified: Secondary | ICD-10-CM | POA: Diagnosis not present

## 2019-10-27 MED ORDER — CLOTRIMAZOLE-BETAMETHASONE 1-0.05 % EX CREA: 1.0000 "application " | TOPICAL_CREAM | Freq: Two times a day (BID) | CUTANEOUS | 1 refills | Status: DC

## 2019-10-27 NOTE — Progress Notes (Signed)
[facility] [place], [place] [address]  Internal MEDICINE  Office Visit Note  Patient Name: Jamie Bennett  [id number]  [id number]  Date of Service: 11/09/2019  Chief Complaint  Patient presents with  . Hypothyroidism  . Osteoporosis  . Gastroesophageal Reflux  . Arthritis  . Quality Metric Gaps    mammogram, not done for last year    The patient is here for routine follow up. She states that she is doing well and has no concerns or complaints at this time. She states that she is feeling well, overall. She states she has been developing rash under the breasts and in the folds of her skin. She has been using nystatin ointment and it is just not helping. She continues to see psychiatry for mental health treatment.       Current Medication: Outpatient Encounter Medications as of 10/27/2019  Medication Sig  . albuterol (VENTOLIN HFA) 108 (90 Base) MCG/ACT inhaler Inhale 2 puffs into the lungs every 6 (six) hours as needed for wheezing or shortness of breath.  Marland Kitchen aspirin EC 81 MG tablet Take 81 mg by mouth daily.  . busPIRone (BUSPAR) 30 MG tablet 1 bid  . Cholecalciferol (HM VITAMIN D3) 4000 units CAPS Take 4,000 Units by mouth daily.   . Coenzyme Q-10 200 MG CAPS Take 200 mg by mouth 2 (two) times daily.   . cyanocobalamin (,VITAMIN B-12,) 1000 MCG/ML injection Inject 1,000 mcg into the muscle every 3 (three) months.   Marland Kitchen denosumab (PROLIA) 60 MG/ML SOSY injection Inject 60 mg into the skin every 6 (six) months.  . desipramine (NORPRAMIN) 25 MG tablet 1  qam  . esomeprazole (NEXIUM) 20 MG capsule Take 20 mg by mouth daily.   . Fiber, Guar Gum, CHEW Chew 5 mg by mouth daily.   . hydroxychloroquine (PLAQUENIL) 200 MG tablet TAKE ONE TABLET BY MOUTH TWICE A DAY MONDAY - FRIDAY FOR RHEUMATOID ARTHRITIS  . hydrOXYzine (ATARAX/VISTARIL) 50 MG tablet Take 1 tablet (50 mg total) by mouth 3 (three) times daily as needed for anxiety.  Marland Kitchen ibuprofen (ADVIL,MOTRIN)  100 MG tablet Take 100 mg by mouth as needed for fever.  Javier Docker Oil 500 MG CAPS Take 500 mg by mouth daily.  Marland Kitchen lamoTRIgine (LAMICTAL) 150 MG tablet Take 1 tablet (150 mg total) by mouth 2 (two) times daily.  Marland Kitchen levothyroxine (SYNTHROID) 112 MCG tablet Take 1 tablet (112 mcg total) by mouth daily.  Marland Kitchen loratadine (CLARITIN) 10 MG tablet Take 10 mg by mouth daily as needed for allergies.  Marland Kitchen ondansetron (ZOFRAN ODT) 4 MG disintegrating tablet Take 1 tablet (4 mg total) by mouth every 8 (eight) hours as needed for nausea or vomiting.  . polyethylene glycol (MIRALAX) packet Take 17 g by mouth daily.  . sucralfate (CARAFATE) 1 g tablet Take 1 tablet (1 g total) by mouth 2 (two) times daily.  Marland Kitchen tiZANidine (ZANAFLEX) 4 MG tablet Take 1 tablet (4 mg total) by mouth 3 (three) times daily as needed.  . traZODone (DESYREL) 100 MG tablet Take 1 tablet (100 mg total) by mouth at bedtime as needed and may repeat dose one time if needed for sleep.  Marland Kitchen venlafaxine (EFFEXOR) 100 MG tablet Take 1 tablet (100 mg total) by mouth 3 (three) times daily with meals.  . [DISCONTINUED] clonazePAM (KLONOPIN) 0.5 MG tablet Take one tablet by mouth for acute anxiety, EMERGENCIES only - do not exceed 1 tab daily  . clotrimazole-betamethasone (LOTRISONE) cream Apply 1  application topically 2 (two) times daily.   No facility-administered encounter medications on file as of 10/27/2019.    Surgical History: Past Surgical History:  Procedure Laterality Date  . CHOLECYSTECTOMY  2004  . COLONOSCOPY N/A 07/16/2017   Procedure: COLONOSCOPY;  Surgeon: Manya Silvas, MD;  Location: [facility] ENDOSCOPY;  Service: Endoscopy;  Laterality: N/A;  . EXPLORATORY LAPAROTOMY  2009  . GASTRIC BYPASS  2003   [place] N/A 03/01/2018   Procedure: EXPLORATORY LAPAROTOMY/ UMBILECTOMY;  Surgeon: Robert Bellow, MD;  Location: [facility] ORS;  Service: General;  Laterality: N/A;  . TONSILLECTOMY    . VENTRAL HERNIA REPAIR N/A  03/07/2018   Procedure: HERNIA REPAIR VENTRAL ADULT;  Surgeon: Robert Bellow, MD;  Location: [facility] ORS;  Service: General;  Laterality: N/A;    Medical History: Past Medical History:  Diagnosis Date  . Anemia   . Anxiety   . Bipolar 1 disorder ([place])   . Collagen vascular disease ([place])    rhematoid arthritis  . Constipation   . Fibromyalgia   . GERD (gastroesophageal reflux disease)   . Heart murmur    mild-asymptomatic  . Hepatic steatosis   . History of Roux-en-Y gastric bypass   . Hypotension   . Hypothyroidism   . Opioid abuse ([place])   . Osteoarthritis   . Osteoporosis   . PONV (postoperative nausea and vomiting)    nausea only  . Rheumatic fever   . Rheumatoid arthritis ([place])   . Thyroid disease     Family History: Family History  Problem Relation Age of Onset  . Depression Mother   . Dementia Mother   . Heart disease Mother   . Osteoporosis Mother   . Parkinson's disease Father   . Colon cancer Neg Hx     Social History   Socioeconomic History  . Marital status: Married    Spouse name: Not on file  . Number of children: Not on file  . Years of education: Not on file  . Highest education level: Not on file  Occupational History  . Not on file  Tobacco Use  . Smoking status: Never Smoker  . Smokeless tobacco: Never Used  Substance and Sexual Activity  . Alcohol use: No    Alcohol/week: 0.0 standard drinks  . Drug use: No  . Sexual activity: Yes  Other Topics Concern  . Not on file  Social History Narrative  . Not on file   Social Determinants of Health   Financial Resource Strain:   . Difficulty of Paying Living Expenses:   Food Insecurity:   . Worried About Charity fundraiser in the Last Year:   . Arboriculturist in the Last Year:   Transportation Needs:   . Film/video editor (Medical):   Marland Kitchen Lack of Transportation (Non-Medical):   Physical Activity:   . Days of Exercise per Week:   . Minutes of Exercise per Session:   Stress:    . Feeling of Stress :   Social Connections:   . Frequency of Communication with Friends and Family:   . Frequency of Social Gatherings with Friends and Family:   . Attends Religious Services:   . Active Member of Clubs or Organizations:   . Attends Archivist Meetings:   Marland Kitchen Marital Status:   Intimate Partner Violence:   . Fear of Current or Ex-Partner:   . Emotionally Abused:   Marland Kitchen Physically Abused:   . Sexually Abused:  Review of Systems  Constitutional: Positive for fatigue. Negative for activity change, chills and unexpected weight change.  HENT: Negative for congestion, postnasal drip, rhinorrhea, sneezing and sore throat.   Respiratory: Negative for cough, chest tightness, shortness of breath and wheezing.   Cardiovascular: Negative for chest pain and palpitations.  Gastrointestinal: Negative for abdominal pain, constipation, diarrhea, nausea and vomiting.  Endocrine: Negative for cold intolerance, heat intolerance, polydipsia and polyuria.  Musculoskeletal: Positive for arthralgias, back pain and myalgias. Negative for joint swelling and neck pain.       See rheumatology  Skin: Positive for rash.       Circular rash present. Itchy and irritating.   Allergic/Immunologic: Positive for environmental allergies.  Neurological: Negative for dizziness, tremors, numbness and headaches.  Hematological: Negative for adenopathy. Does not bruise/bleed easily.  Psychiatric/Behavioral: Positive for dysphoric mood. Negative for behavioral problems (Depression), sleep disturbance and suicidal ideas. The patient is nervous/anxious.     Today's Vitals   10/27/19 1143  BP: 140/76  Pulse: 88  Resp: 16  Temp: (!) 97.4 F (36.3 C)  SpO2: 97%  Weight: (!) 332 lb (150.6 kg)  Height: 5\' 5"  (1.651 m)   Body mass index is 55.25 kg/m.  Physical Exam Vitals and nursing note reviewed.  Constitutional:      General: She is not in acute distress.    Appearance: Normal  appearance. She is well-developed. She is obese. She is not diaphoretic.  HENT:     Head: Normocephalic and atraumatic.     Nose: Nose normal.     Mouth/Throat:     Pharynx: No oropharyngeal exudate.  Eyes:     Pupils: Pupils are equal, round, and reactive to light.  Neck:     Thyroid: No thyromegaly.     Vascular: No carotid bruit or JVD.     Trachea: No tracheal deviation.  Cardiovascular:     Rate and Rhythm: Normal rate and regular rhythm.     Heart sounds: Normal heart sounds. No murmur. No friction rub. No gallop.   Pulmonary:     Effort: Pulmonary effort is normal. No respiratory distress.     Breath sounds: Normal breath sounds. No wheezing or rales.  Chest:     Chest wall: No tenderness.  Abdominal:     Palpations: Abdomen is soft.  Musculoskeletal:        General: Normal range of motion.     Cervical back: Normal range of motion and neck supple.  Lymphadenopathy:     Cervical: No cervical adenopathy.  Skin:    General: Skin is warm and dry.     Comments: There is red, inflamed rash present. It is circular in nature and has darker perimeter, with scaly inner aspect. Skin intact with no drainage present.   Neurological:     General: No focal deficit present.     Mental Status: She is alert and oriented to person, place, and time.     Cranial Nerves: No cranial nerve deficit.  Psychiatric:        Behavior: Behavior normal.        Thought Content: Thought content normal.        Judgment: Judgment normal.   Assessment/Plan: 1. Acquired hypothyroidism Thyroid panel stable. Continue levothyroxine as prescribed   2. Ringworm of body Start lotrisone cream. Use twice daily for next 10 days.  - clotrimazole-betamethasone (LOTRISONE) cream; Apply 1 application topically 2 (two) times daily.  Dispense: 45 g; Refill: 1  3. Bipolar affective  disorder, current episode mixed, current episode severity unspecified ([place]) Continue regular visits with psychiatry   General  Counseling: Costella verbalizes understanding of the findings of todays visit and agrees with plan of treatment. I have discussed any further diagnostic evaluation that may be needed or ordered today. We also reviewed her medications today. she has been encouraged to call the office with any questions or concerns that should arise related to todays visit.  This patient was seen by [place] with Dr Lavera Guise as a part of collaborative care agreement  Meds ordered this encounter  Medications  . clotrimazole-betamethasone (LOTRISONE) cream    Sig: Apply 1 application topically 2 (two) times daily.    Dispense:  45 g    Refill:  1    Order Specific Question:   Supervising Provider    Answer:   Lavera Guise [id number]    Total time spent: 20 Minutes   Time spent includes review of chart, medications, test results, and follow up plan with the patient.      Dr Lavera Guise Internal medicine

## 2019-11-05 ENCOUNTER — Other Ambulatory Visit (HOSPITAL_COMMUNITY): Payer: Self-pay | Admitting: Psychiatry

## 2019-11-05 DIAGNOSIS — F411 Generalized anxiety disorder: Secondary | ICD-10-CM

## 2019-11-07 ENCOUNTER — Other Ambulatory Visit (HOSPITAL_COMMUNITY): Payer: Self-pay | Admitting: *Deleted

## 2019-11-09 DIAGNOSIS — F3160 Bipolar disorder, current episode mixed, unspecified: Secondary | ICD-10-CM | POA: Insufficient documentation

## 2019-11-12 ENCOUNTER — Other Ambulatory Visit: Payer: Self-pay | Admitting: Nurse Practitioner

## 2019-11-12 ENCOUNTER — Telehealth: Payer: Self-pay

## 2019-11-12 DIAGNOSIS — N390 Urinary tract infection, site not specified: Secondary | ICD-10-CM

## 2019-11-12 MED ORDER — NITROFURANTOIN MONOHYD MACRO 100 MG PO CAPS: 100.0000 mg | ORAL_CAPSULE | Freq: Two times a day (BID) | ORAL | 0 refills | Status: DC

## 2019-11-12 NOTE — Telephone Encounter (Signed)
Sent prescription for macrobid 100mg  bid for 7 days to [facility] on [address]. Recommend OTC AZO to help with bladder pain and spasms. Increase water intake.

## 2019-11-12 NOTE — Telephone Encounter (Signed)
Pt.notified

## 2019-11-12 NOTE — Progress Notes (Signed)
Sent prescription for macrobid 100mg  bid for 7 days to [facility] on [address]. Recommend OTC AZO to help with bladder pain and spasms. Increase water intake.

## 2019-12-07 ENCOUNTER — Other Ambulatory Visit (HOSPITAL_COMMUNITY): Payer: Self-pay | Admitting: Psychiatry

## 2019-12-07 DIAGNOSIS — F411 Generalized anxiety disorder: Secondary | ICD-10-CM

## 2019-12-15 ENCOUNTER — Other Ambulatory Visit (HOSPITAL_COMMUNITY): Payer: Self-pay | Admitting: *Deleted

## 2019-12-15 DIAGNOSIS — F411 Generalized anxiety disorder: Secondary | ICD-10-CM

## 2019-12-15 MED ORDER — CLONAZEPAM 0.5 MG PO TABS: ORAL_TABLET | ORAL | 3 refills | Status: DC

## 2019-12-16 ENCOUNTER — Telehealth (HOSPITAL_COMMUNITY): Payer: Self-pay

## 2019-12-16 NOTE — Telephone Encounter (Signed)
Patient called requesting a refill on her Clonazepam

## 2019-12-17 ENCOUNTER — Telehealth (HOSPITAL_COMMUNITY): Payer: Self-pay

## 2019-12-17 NOTE — Telephone Encounter (Signed)
Patient called and stated that the pharmacy did not receive her script for Clonazepam 0.5mg  that doctor called in on 12/15/19 #30 3 refills. I called pharmacy and re-sent verbal order for Clonazepam 0.5mg  #30 3 refills per doctor

## 2019-12-22 ENCOUNTER — Other Ambulatory Visit: Payer: Self-pay

## 2019-12-22 ENCOUNTER — Inpatient Hospital Stay: Payer: Medicare HMO

## 2019-12-22 ENCOUNTER — Inpatient Hospital Stay (HOSPITAL_BASED_OUTPATIENT_CLINIC_OR_DEPARTMENT_OTHER): Payer: Medicare HMO | Admitting: Oncology

## 2019-12-22 ENCOUNTER — Encounter: Payer: Self-pay | Admitting: Oncology

## 2019-12-22 ENCOUNTER — Other Ambulatory Visit: Payer: Self-pay | Admitting: Adult Health

## 2019-12-22 ENCOUNTER — Inpatient Hospital Stay: Payer: Medicare HMO | Attending: Oncology

## 2019-12-22 VITALS — BP 115/72 | HR 75 | Temp 98.5°F | Resp 18 | Wt 339.0 lb

## 2019-12-22 DIAGNOSIS — F1110 Opioid abuse, uncomplicated: Secondary | ICD-10-CM | POA: Insufficient documentation

## 2019-12-22 DIAGNOSIS — E039 Hypothyroidism, unspecified: Secondary | ICD-10-CM | POA: Insufficient documentation

## 2019-12-22 DIAGNOSIS — Z79899 Other long term (current) drug therapy: Secondary | ICD-10-CM | POA: Diagnosis not present

## 2019-12-22 DIAGNOSIS — D649 Anemia, unspecified: Secondary | ICD-10-CM | POA: Insufficient documentation

## 2019-12-22 DIAGNOSIS — R5383 Other fatigue: Secondary | ICD-10-CM | POA: Diagnosis not present

## 2019-12-22 DIAGNOSIS — E538 Deficiency of other specified B group vitamins: Secondary | ICD-10-CM

## 2019-12-22 DIAGNOSIS — Z9884 Bariatric surgery status: Secondary | ICD-10-CM | POA: Diagnosis not present

## 2019-12-22 DIAGNOSIS — M797 Fibromyalgia: Secondary | ICD-10-CM | POA: Diagnosis not present

## 2019-12-22 DIAGNOSIS — K219 Gastro-esophageal reflux disease without esophagitis: Secondary | ICD-10-CM | POA: Diagnosis not present

## 2019-12-22 DIAGNOSIS — Z7982 Long term (current) use of aspirin: Secondary | ICD-10-CM | POA: Insufficient documentation

## 2019-12-22 DIAGNOSIS — R011 Cardiac murmur, unspecified: Secondary | ICD-10-CM | POA: Insufficient documentation

## 2019-12-22 DIAGNOSIS — D509 Iron deficiency anemia, unspecified: Secondary | ICD-10-CM | POA: Diagnosis not present

## 2019-12-22 DIAGNOSIS — K760 Fatty (change of) liver, not elsewhere classified: Secondary | ICD-10-CM | POA: Diagnosis not present

## 2019-12-22 DIAGNOSIS — R69 Illness, unspecified: Secondary | ICD-10-CM | POA: Diagnosis not present

## 2019-12-22 DIAGNOSIS — F319 Bipolar disorder, unspecified: Secondary | ICD-10-CM | POA: Insufficient documentation

## 2019-12-22 DIAGNOSIS — M069 Rheumatoid arthritis, unspecified: Secondary | ICD-10-CM | POA: Diagnosis not present

## 2019-12-22 LAB — IRON AND TIBC
Iron: 94 ug/dL (ref 28–170)
Saturation Ratios: 23 % (ref 10.4–31.8)
TIBC: 403 ug/dL (ref 250–450)
UIBC: 309 ug/dL

## 2019-12-22 LAB — CBC
HCT: 39.5 % (ref 36.0–46.0)
Hemoglobin: 12.6 g/dL (ref 12.0–15.0)
MCH: 29.9 pg (ref 26.0–34.0)
MCHC: 31.9 g/dL (ref 30.0–36.0)
MCV: 93.8 fL (ref 80.0–100.0)
Platelets: 190 10*3/uL (ref 150–400)
RBC: 4.21 MIL/uL (ref 3.87–5.11)
RDW: 12.8 % (ref 11.5–15.5)
WBC: 4.6 10*3/uL (ref 4.0–10.5)
nRBC: 0 % (ref 0.0–0.2)

## 2019-12-22 LAB — FERRITIN: Ferritin: 84 ng/mL (ref 11–307)

## 2019-12-22 MED ORDER — CYANOCOBALAMIN 1000 MCG/ML IJ SOLN
1000.0000 ug | Freq: Once | INTRAMUSCULAR | Status: AC
  Administered 2019-12-22: 1000 ug via INTRAMUSCULAR
  Filled 2019-12-22: qty 1

## 2019-12-22 NOTE — Progress Notes (Signed)
Patient here for oncology follow-up appointment, expresses concerns of extreme fatigue.

## 2019-12-25 NOTE — Progress Notes (Signed)
Hematology/Oncology Consult note [facility]  [phone] [phone]  Patient Care Team: Ronnell Freshwater, NP as PCP - General (Family Medicine)   Name of the patient: Jamie Bennett  [id number]  [birth year]   Date of visit: 12/25/19  Diagnosis- iron B12 and deficiency anemia   Chief complaint/ Reason for visit-routine follow-up of anemia  Heme/Onc history:  Patient is a 59 year old female with history of gastric bypass surgery in 2003. She is subsequent developed iron and B12 deficiency and has been getting IV iron from time to time. She was previously seen Dr. Mike Gip and transferring her care to me   Interval history-patient reports doing well.  Still has ongoing fatigue.  Denies any blood in her stool or urine.  ECOG PS- 1 Pain scale- 0   Review of systems- Review of Systems  Constitutional: Positive for malaise/fatigue. Negative for chills, fever and weight loss.  HENT: Negative for congestion, ear discharge and nosebleeds.   Eyes: Negative for blurred vision.  Respiratory: Negative for cough, hemoptysis, sputum production, shortness of breath and wheezing.   Cardiovascular: Negative for chest pain, palpitations, orthopnea and claudication.  Gastrointestinal: Negative for abdominal pain, blood in stool, constipation, diarrhea, heartburn, melena, nausea and vomiting.  Genitourinary: Negative for dysuria, flank pain, frequency, hematuria and urgency.  Musculoskeletal: Negative for back pain, joint pain and myalgias.  Skin: Negative for rash.  Neurological: Negative for dizziness, tingling, focal weakness, seizures, weakness and headaches.  Endo/Heme/Allergies: Does not bruise/bleed easily.  Psychiatric/Behavioral: Negative for depression and suicidal ideas. The patient does not have insomnia.      Allergies  Allergen Reactions  . Lactose Intolerance (Gi) Other (See Comments)    Bloating and GI distress  . Sulfa  Antibiotics Hives     Past Medical History:  Diagnosis Date  . Anemia   . Anxiety   . Bipolar 1 disorder ([place])   . Collagen vascular disease ([place])    rhematoid arthritis  . Constipation   . Fibromyalgia   . GERD (gastroesophageal reflux disease)   . Heart murmur    mild-asymptomatic  . Hepatic steatosis   . History of Roux-en-Y gastric bypass   . Hypotension   . Hypothyroidism   . Opioid abuse ([place])   . Osteoarthritis   . Osteoporosis   . PONV (postoperative nausea and vomiting)    nausea only  . Rheumatic fever   . Rheumatoid arthritis ([place])   . Thyroid disease      Past Surgical History:  Procedure Laterality Date  . CHOLECYSTECTOMY  2004  . COLONOSCOPY N/A 07/16/2017   Procedure: COLONOSCOPY;  Surgeon: Manya Silvas, MD;  Location[facility] ENDOSCOPY;  Service: Endoscopy;  Laterality: N/A;  . EXPLORATORY LAPAROTOMY  2009  . GASTRIC BYPASS  2003   [place] N/A 03/01/2018   Procedure: EXPLORATORY LAPAROTOMY/ UMBILECTOMY;  Surgeon: Robert Bellow, MD;  Location: [facility] ORS;  Service: General;  Laterality: N/A;  . TONSILLECTOMY    . VENTRAL HERNIA REPAIR N/A 03/07/2018   Procedure: HERNIA REPAIR VENTRAL ADULT;  Surgeon: Robert Bellow, MD;  Location: [facility] ORS;  Service: General;  Laterality: N/A;    Social History   Socioeconomic History  . Marital status: Married    Spouse name: Not on file  . Number of children: Not on file  . Years of education: Not on file  . Highest education level: Not on file  Occupational History  . Not on file  Tobacco  Use  . Smoking status: Never Smoker  . Smokeless tobacco: Never Used  Substance and Sexual Activity  . Alcohol use: No    Alcohol/week: 0.0 standard drinks  . Drug use: No  . Sexual activity: Yes  Other Topics Concern  . Not on file  Social History Narrative  . Not on file   Social Determinants of Health   Financial Resource Strain:   . Difficulty of Paying Living Expenses:   Food  Insecurity:   . Worried About Charity fundraiser in the Last Year:   . Arboriculturist in the Last Year:   Transportation Needs:   . Film/video editor (Medical):   Marland Kitchen Lack of Transportation (Non-Medical):   Physical Activity:   . Days of Exercise per Week:   . Minutes of Exercise per Session:   Stress:   . Feeling of Stress :   Social Connections:   . Frequency of Communication with Friends and Family:   . Frequency of Social Gatherings with Friends and Family:   . Attends Religious Services:   . Active Member of Clubs or Organizations:   . Attends Archivist Meetings:   Marland Kitchen Marital Status:   Intimate Partner Violence:   . Fear of Current or Ex-Partner:   . Emotionally Abused:   Marland Kitchen Physically Abused:   . Sexually Abused:     Family History  Problem Relation Age of Onset  . Depression Mother   . Dementia Mother   . Heart disease Mother   . Osteoporosis Mother   . Parkinson's disease Father   . Colon cancer Neg Hx      Current Outpatient Medications:  .  albuterol (VENTOLIN HFA) 108 (90 Base) MCG/ACT inhaler, Inhale 2 puffs into the lungs every 6 (six) hours as needed for wheezing or shortness of breath., Disp: 1 Inhaler, Rfl: 3 .  aspirin EC 81 MG tablet, Take 81 mg by mouth daily., Disp: , Rfl:  .  busPIRone (BUSPAR) 30 MG tablet, 1 bid, Disp: 180 tablet, Rfl: 1 .  Cholecalciferol (HM VITAMIN D3) 4000 units CAPS, Take 4,000 Units by mouth daily. , Disp: , Rfl:  .  clonazePAM (KLONOPIN) 0.5 MG tablet, Take one tablet by mouth for acute anxiety, EMERGENCIES only - do not exceed 1 tab daily, Disp: 30 tablet, Rfl: 3 .  Coenzyme Q-10 200 MG CAPS, Take 200 mg by mouth 2 (two) times daily. , Disp: , Rfl:  .  cyanocobalamin (,VITAMIN B-12,) 1000 MCG/ML injection, Inject 1,000 mcg into the muscle every 3 (three) months. , Disp: , Rfl:  .  denosumab (PROLIA) 60 MG/ML SOSY injection, Inject 60 mg into the skin every 6 (six) months., Disp: , Rfl:  .  desipramine  (NORPRAMIN) 25 MG tablet, 1  qam, Disp: 90 tablet, Rfl: 3 .  esomeprazole (NEXIUM) 20 MG capsule, Take 20 mg by mouth daily. , Disp: , Rfl:  .  hydroxychloroquine (PLAQUENIL) 200 MG tablet, TAKE ONE TABLET BY MOUTH TWICE A DAY MONDAY - FRIDAY FOR RHEUMATOID ARTHRITIS, Disp: 120 tablet, Rfl: 0 .  hydrOXYzine (ATARAX/VISTARIL) 50 MG tablet, Take 1 tablet (50 mg total) by mouth 3 (three) times daily as needed for anxiety., Disp: 270 tablet, Rfl: 1 .  ibuprofen (ADVIL,MOTRIN) 100 MG tablet, Take 100 mg by mouth as needed for fever., Disp: , Rfl:  .  Krill Oil 500 MG CAPS, Take 500 mg by mouth daily., Disp: , Rfl:  .  lamoTRIgine (LAMICTAL) 150 MG tablet,  Take 1 tablet (150 mg total) by mouth 2 (two) times daily., Disp: 180 tablet, Rfl: 1 .  levothyroxine (SYNTHROID) 112 MCG tablet, Take 1 tablet (112 mcg total) by mouth daily., Disp: 90 tablet, Rfl: 1 .  loratadine (CLARITIN) 10 MG tablet, Take 10 mg by mouth daily as needed for allergies., Disp: , Rfl:  .  ondansetron (ZOFRAN ODT) 4 MG disintegrating tablet, Take 1 tablet (4 mg total) by mouth every 8 (eight) hours as needed for nausea or vomiting., Disp: 12 tablet, Rfl: 0 .  polyethylene glycol (MIRALAX) packet, Take 17 g by mouth daily., Disp: 14 each, Rfl: 0 .  sucralfate (CARAFATE) 1 g tablet, Take 1 tablet (1 g total) by mouth 2 (two) times daily., Disp: 180 tablet, Rfl: 3 .  traZODone (DESYREL) 100 MG tablet, Take 1 tablet (100 mg total) by mouth at bedtime as needed and may repeat dose one time if needed for sleep., Disp: 60 tablet, Rfl: 5 .  venlafaxine (EFFEXOR) 100 MG tablet, Take 1 tablet (100 mg total) by mouth 3 (three) times daily with meals., Disp: 90 tablet, Rfl: 4 .  clotrimazole-betamethasone (LOTRISONE) cream, Apply 1 application topically 2 (two) times daily. (Patient not taking: Reported on 12/22/2019), Disp: 45 g, Rfl: 1 .  Fiber, Guar Gum, CHEW, Chew 5 mg by mouth daily. , Disp: , Rfl:  .  tiZANidine (ZANAFLEX) 4 MG tablet, TAKE 1  TABLET BY MOUTH THREE TIMES DAILY, Disp: 90 tablet, Rfl: 0  Physical exam:  Vitals:   12/22/19 1051  BP: 115/72  Pulse: 75  Resp: 18  Temp: 98.5 F (36.9 C)  TempSrc: Oral  SpO2: 100%  Weight: (!) 339 lb (153.8 kg)   Physical Exam Constitutional:      General: She is not in acute distress. Cardiovascular:     Rate and Rhythm: Normal rate and regular rhythm.     Heart sounds: Normal heart sounds.  Pulmonary:     Effort: Pulmonary effort is normal.     Breath sounds: Normal breath sounds.  Abdominal:     General: Bowel sounds are normal.     Palpations: Abdomen is soft.  Skin:    General: Skin is warm and dry.  Neurological:     Mental Status: She is alert and oriented to person, place, and time.      CMP Latest Ref Rng & Units 10/03/2019  Glucose 65 - 99 mg/dL 86  BUN 6 - 24 mg/dL 17  Creatinine 0.57 - 1.00 mg/dL 0.85  Sodium 134 - 144 mmol/L 144  Potassium 3.5 - 5.2 mmol/L 3.8  Chloride 96 - 106 mmol/L 108(H)  CO2 20 - 29 mmol/L 20  Calcium 8.7 - 10.2 mg/dL 9.5  Total Protein 6.0 - 8.5 g/dL 6.5  Total Bilirubin 0.0 - 1.2 mg/dL 0.2  Alkaline Phos 39 - 117 IU/L 132(H)  AST 0 - 40 IU/L 15  ALT 0 - 32 IU/L 18   CBC Latest Ref Rng & Units 12/22/2019  WBC 4.0 - 10.5 K/uL 4.6  Hemoglobin 12.0 - 15.0 g/dL 12.6  Hematocrit 36.0 - 46.0 % 39.5  Platelets 150 - 400 K/uL 190     Assessment and plan- Patient is a 59 y.o. female with iron and B12 deficiency anemia secondary to gastric bypass here for routine follow-up   Patient reports fatigue.  However her fatigue is not related to her anemia.  Her hemoglobin has remained stable between 12-13 over the last 1 year.  Iron studies show  a normal ferritin of 84 and normal iron studies.  Her B12 levels in February were elevated at 1786.  She has been getting monthly B12 shots.  Repeat CBC ferritin iron studies in 3 months in 6 months and I will see her in 6 months.  She will get B12 shot when she comes for her blood work.       Visit Diagnosis 1. Iron deficiency anemia, unspecified iron deficiency anemia type   2. B12 deficiency   3. History of gastric bypass      Dr. Randa Evens, MD, MPH [facility] at [facility] [id number] 12/25/2019 9:10 AM

## 2020-01-01 ENCOUNTER — Other Ambulatory Visit (HOSPITAL_COMMUNITY): Payer: Self-pay | Admitting: Psychiatry

## 2020-01-01 DIAGNOSIS — F411 Generalized anxiety disorder: Secondary | ICD-10-CM

## 2020-01-01 DIAGNOSIS — F332 Major depressive disorder, recurrent severe without psychotic features: Secondary | ICD-10-CM

## 2020-01-01 DIAGNOSIS — F603 Borderline personality disorder: Secondary | ICD-10-CM

## 2020-01-02 ENCOUNTER — Other Ambulatory Visit (HOSPITAL_COMMUNITY): Payer: Self-pay | Admitting: *Deleted

## 2020-01-02 DIAGNOSIS — F332 Major depressive disorder, recurrent severe without psychotic features: Secondary | ICD-10-CM

## 2020-01-02 DIAGNOSIS — F603 Borderline personality disorder: Secondary | ICD-10-CM

## 2020-01-02 DIAGNOSIS — F411 Generalized anxiety disorder: Secondary | ICD-10-CM

## 2020-01-02 MED ORDER — VENLAFAXINE HCL 100 MG PO TABS: 100.0000 mg | ORAL_TABLET | Freq: Three times a day (TID) | ORAL | 4 refills | Status: DC

## 2020-01-05 ENCOUNTER — Other Ambulatory Visit: Payer: Self-pay | Admitting: Rheumatology

## 2020-01-05 DIAGNOSIS — M0579 Rheumatoid arthritis with rheumatoid factor of multiple sites without organ or systems involvement: Secondary | ICD-10-CM

## 2020-01-05 NOTE — Telephone Encounter (Signed)
Last Visit: 08/15/2019 Next Visit: 01/20/2020 Labs: 10/03/2019 CBC is normal. Chloride and alk phos is mildly elevated Eye exam: 07/22/2019 WNL  Current Dose per office note 08/15/2019: Plaquenil 200 mg 1 tablet twice daily Monday-Friday only  Okay to refill per Dr. Deveshwar  

## 2020-01-07 NOTE — Progress Notes (Deleted)
Office Visit Note  Patient: Jamie Bennett             Date of Birth: [birth year]           MRN: [id number]             PCP: Ronnell Freshwater, NP Referring: Ronnell Freshwater, NP Visit Date: 01/20/2020 Occupation: @GUAROCC @  Subjective:  No chief complaint on file.   History of Present Illness: Jamie Bennett is a 59 y.o. female ***   Activities of Daily Living:  Patient reports morning stiffness for *** {minute/hour:19697}.   Patient {ACTIONS;DENIES/REPORTS:21021675::"Denies"} nocturnal pain.  Difficulty dressing/grooming: {ACTIONS;DENIES/REPORTS:21021675::"Denies"} Difficulty climbing stairs: {ACTIONS;DENIES/REPORTS:21021675::"Denies"} Difficulty getting out of chair: {ACTIONS;DENIES/REPORTS:21021675::"Denies"} Difficulty using hands for taps, buttons, cutlery, and/or writing: {ACTIONS;DENIES/REPORTS:21021675::"Denies"}  No Rheumatology ROS completed.   PMFS History:  Patient Active Problem List   Diagnosis Date Noted  . Bipolar affective disorder, current episode mixed ([place]) 11/09/2019  . Encounter for general adult medical examination with abnormal findings 06/15/2019  . Closed fracture of left elbow 06/15/2019  . Urinary tract infection with hematuria 06/15/2019  . Encounter for long-term (current) use of medications 06/15/2019  . Acute upper respiratory infection 06/06/2019  . Exposure to COVID-19 virus 06/06/2019  . Exercise-induced asthma 12/15/2018  . Screening for breast cancer 05/29/2018  . Duodenal ulcer disease 05/29/2018  . Screening for malignant neoplasm of cervix 05/29/2018  . Dysuria 05/29/2018  . Surgical wound dehiscence, initial encounter 04/14/2018  . Postoperative abdominal hernia with obstruction   . Incarcerated ventral hernia 03/22/2018  . SBO (small bowel obstruction) ([place]) 03/07/2018  . Abscess of abdominal wall 03/01/2018  . Persistent umbilical sinus 91/50/5697  . Ringworm of body 01/23/2018  . Atopic dermatitis 12/05/2017  .  Adjustment disorder with mixed anxiety and depressed mood 03/10/2017  . Major depressive disorder 03/10/2017  . Primary osteoarthritis of both knees 01/19/2017  . Suicide attempt ([place]) 07/10/2016  . Fibromyalgia 07/10/2016  . High risk medication use 07/10/2016  . AKI (acute kidney injury) ([place]) 11/09/2015  . Elevated troponin 11/09/2015  . Hypotension 11/09/2015  . Respiratory failure ([place])   . Acute hepatic failure 11/08/2015  . Drug overdose 11/08/2015  . Fatty infiltration of liver 02/16/2015  . Hepatic fibrosis 02/16/2015  . Abnormal serum level of alkaline phosphatase 02/15/2015  . Iron deficiency anemia 02/05/2015  . Vitamin B 12 deficiency 02/05/2015  . OP (osteoporosis) 06/16/2014  . Rheumatoid arteritis ([place]) 03/02/2014  . Hypothyroidism 03/02/2014  . Rheumatic fever without heart involvement 03/02/2014  . Adult hypothyroidism 03/02/2014  . Arthritis of pelvic region, degenerative 03/02/2014  . Bipolar 1 disorder, depressed ([place]) 02/26/2014  . Bariatric surgery status 11/24/2013  . Affective bipolar disorder ([place]) 11/24/2013  . Bipolar affective disorder ([place]) 11/24/2013  . Rheumatoid arthritis ([place]) 11/24/2013  . Polysubstance (excluding opioids) dependence ([place]) 09/11/2013  . Polysubstance dependence ([place]) 09/11/2013  . Combined drug dependence excluding opioids ([place]) 09/11/2013  . Arthritis or polyarthritis, rheumatoid ([place]) 09/05/2013  . Leg weakness 09/05/2013    Past Medical History:  Diagnosis Date  . Anemia   . Anxiety   . Bipolar 1 disorder ([place])   . Collagen vascular disease ([place])    rhematoid arthritis  . Constipation   . Fibromyalgia   . GERD (gastroesophageal reflux disease)   . Heart murmur    mild-asymptomatic  . Hepatic steatosis   . History of Roux-en-Y gastric bypass   . Hypotension   . Hypothyroidism   . Opioid abuse ([place])   .  Osteoarthritis   . Osteoporosis   . PONV (postoperative nausea and vomiting)    nausea only  . Rheumatic  fever   . Rheumatoid arthritis ([place])   . Thyroid disease     Family History  Problem Relation Age of Onset  . Depression Mother   . Dementia Mother   . Heart disease Mother   . Osteoporosis Mother   . Parkinson's disease Father   . Colon cancer Neg Hx    Past Surgical History:  Procedure Laterality Date  . CHOLECYSTECTOMY  2004  . COLONOSCOPY N/A 07/16/2017   Procedure: COLONOSCOPY;  Surgeon: Manya Silvas, MD;  Location: [facility] END[facility]COPY;  Service: Endoscopy;  Laterality: N/A;  . EXPLORATORY LAPAROTOMY  2009  . GASTRIC BYPASS  2003   [place] N/A 03/01/2018   Procedure: EXPLORATORY LAPAROTOMY/ UMBILECTOMY;  Surgeon: Robert Bellow, MD;  Location: [facility] ORS;  Service: General;  Laterality: N/A;  . TONSILLECTOMY    . VENTRAL HERNIA REPAIR N/A 03/07/2018   Procedure: HERNIA REPAIR VENTRAL ADULT;  Surgeon: Robert Bellow, MD;  Location: [facility] ORS;  Service: General;  Laterality: N/A;   Social History   Social History Narrative  . Not on file   Immunization History  Administered Date(s) Administered  . Influenza,inj,Quad PF,6+ Mos 04/24/2016  . Moderna SARS-COVID-2 Vaccination 10/25/2019  . Pneumococcal Polysaccharide-23 03/02/2018     Objective: Vital Signs: There were no vitals taken for this visit.   Physical Exam   Musculoskeletal Exam: ***  CDAI Exam: CDAI Score: -- Patient Global: --; Provider Global: -- Swollen: --; Tender: -- Joint Exam 01/20/2020   No joint exam has been documented for this visit   There is currently no information documented on the homunculus. Go to the Rheumatology activity and complete the homunculus joint exam.  Investigation: No additional findings.  Imaging: No results found.  Recent Labs: Lab Results  Component Value Date   WBC 4.6 12/22/2019   HGB 12.6 12/22/2019   PLT 190 12/22/2019   NA 144 10/03/2019   K 3.8 10/03/2019   CL 108 (H) 10/03/2019   CO2 20 10/03/2019   GLUCOSE 86 10/03/2019    BUN 17 10/03/2019   CREATININE 0.85 10/03/2019   BILITOT 0.2 10/03/2019   ALKPHOS 132 (H) 10/03/2019   AST 15 10/03/2019   ALT 18 10/03/2019   PROT 6.5 10/03/2019   ALBUMIN 3.9 10/03/2019   CALCIUM 9.5 10/03/2019   GFRAA 87 10/03/2019    Speciality Comments: PLQ Eye Exam: 07/22/2019 WNL @ [place] follow up in 6 months.  Procedures:  No procedures performed Allergies: Lactose intolerance (gi) and Sulfa antibiotics   Assessment / Plan:     Visit Diagnoses: No diagnosis found.  Orders: No orders of the defined types were placed in this encounter.  No orders of the defined types were placed in this encounter.   Face-to-face time spent with patient was *** minutes. Greater than 50% of time was spent in counseling and coordination of care.  Follow-Up Instructions: No follow-ups on file.   Earnestine Mealing, CMA  Note - This record has been created using Editor, commissioning.  Chart creation errors have been sought, but may not always  have been located. Such creation errors do not reflect on  the standard of medical care.

## 2020-01-12 DIAGNOSIS — E559 Vitamin D deficiency, unspecified: Secondary | ICD-10-CM | POA: Diagnosis not present

## 2020-01-12 DIAGNOSIS — M810 Age-related osteoporosis without current pathological fracture: Secondary | ICD-10-CM | POA: Diagnosis not present

## 2020-01-12 NOTE — Progress Notes (Deleted)
Office Visit Note  Patient: Jamie Bennett             Date of Birth: 06/03/1961           MRN: [id number]             PCP: Ronnell Freshwater, NP Referring: Ronnell Freshwater, NP Visit Date: 01/14/2020 Occupation: @GUAROCC @  Subjective:  No chief complaint on file.   History of Present Illness: Jamie Bennett is a 59 y.o. female ***   Activities of Daily Living:  Patient reports morning stiffness for *** {minute/hour:19697}.   Patient {ACTIONS;DENIES/REPORTS:21021675::"Denies"} nocturnal pain.  Difficulty dressing/grooming: {ACTIONS;DENIES/REPORTS:21021675::"Denies"} Difficulty climbing stairs: {ACTIONS;DENIES/REPORTS:21021675::"Denies"} Difficulty getting out of chair: {ACTIONS;DENIES/REPORTS:21021675::"Denies"} Difficulty using hands for taps, buttons, cutlery, and/or writing: {ACTIONS;DENIES/REPORTS:21021675::"Denies"}  No Rheumatology ROS completed.   PMFS History:  Patient Active Problem List   Diagnosis Date Noted  . Bipolar affective disorder, current episode mixed ([place]) 11/09/2019  . Encounter for general adult medical examination with abnormal findings 06/15/2019  . Closed fracture of left elbow 06/15/2019  . Urinary tract infection with hematuria 06/15/2019  . Encounter for long-term (current) use of medications 06/15/2019  . Acute upper respiratory infection 06/06/2019  . Exposure to COVID-19 virus 06/06/2019  . Exercise-induced asthma 12/15/2018  . Screening for breast cancer 05/29/2018  . Duodenal ulcer disease 05/29/2018  . Screening for malignant neoplasm of cervix 05/29/2018  . Dysuria 05/29/2018  . Surgical wound dehiscence, initial encounter 04/14/2018  . Postoperative abdominal hernia with obstruction   . Incarcerated ventral hernia 03/22/2018  . SBO (small bowel obstruction) ([place]) 03/07/2018  . Abscess of abdominal wall 03/01/2018  . Persistent umbilical sinus 02/72/5366  . Ringworm of body 01/23/2018  . Atopic dermatitis 12/05/2017  .  Adjustment disorder with mixed anxiety and depressed mood 03/10/2017  . Major depressive disorder 03/10/2017  . Primary osteoarthritis of both knees 01/19/2017  . Suicide attempt ([place]) 07/10/2016  . Fibromyalgia 07/10/2016  . High risk medication use 07/10/2016  . AKI (acute kidney injury) ([place]) 11/09/2015  . Elevated troponin 11/09/2015  . Hypotension 11/09/2015  . Respiratory failure ([place])   . Acute hepatic failure 11/08/2015  . Drug overdose 11/08/2015  . Fatty infiltration of liver 02/16/2015  . Hepatic fibrosis 02/16/2015  . Abnormal serum level of alkaline phosphatase 02/15/2015  . Iron deficiency anemia 02/05/2015  . Vitamin B 12 deficiency 02/05/2015  . OP (osteoporosis) 06/16/2014  . Rheumatoid arteritis ([place]) 03/02/2014  . Hypothyroidism 03/02/2014  . Rheumatic fever without heart involvement 03/02/2014  . Adult hypothyroidism 03/02/2014  . Arthritis of pelvic region, degenerative 03/02/2014  . Bipolar 1 disorder, depressed ([place]) 02/26/2014  . Bariatric surgery status 11/24/2013  . Affective bipolar disorder ([place]) 11/24/2013  . Bipolar affective disorder ([place]) 11/24/2013  . Rheumatoid arthritis ([place]) 11/24/2013  . Polysubstance (excluding opioids) dependence ([place]) 09/11/2013  . Polysubstance dependence ([place]) 09/11/2013  . Combined drug dependence excluding opioids ([place]) 09/11/2013  . Arthritis or polyarthritis, rheumatoid ([place]) 09/05/2013  . Leg weakness 09/05/2013    Past Medical History:  Diagnosis Date  . Anemia   . Anxiety   . Bipolar 1 disorder ([place])   . Collagen vascular disease ([place])    rhematoid arthritis  . Constipation   . Fibromyalgia   . GERD (gastroesophageal reflux disease)   . Heart murmur    mild-asymptomatic  . Hepatic steatosis   . History of Roux-en-Y gastric bypass   . Hypotension   . Hypothyroidism   . Opioid abuse ([place])   .  Osteoarthritis   . Osteoporosis   . PONV (postoperative nausea and vomiting)    nausea only  . Rheumatic  fever   . Rheumatoid arthritis ([place])   . Thyroid disease     Family History  Problem Relation Age of Onset  . Depression Mother   . Dementia Mother   . Heart disease Mother   . Osteoporosis Mother   . Parkinson's disease Father   . Colon cancer Neg Hx    Past Surgical History:  Procedure Laterality Date  . CHOLECYSTECTOMY  2004  . COLONOSCOPY N/A 07/16/2017   Procedure: COLONOSCOPY;  Surgeon: Manya Silvas, MD;  Location: [facility] ENDOSCOPY;  Service: Endoscopy;  Laterality: N/A;  . EXPLORATORY LAPAROTOMY  2009  . GASTRIC BYPASS  2003   [place] N/A 03/01/2018   Procedure: EXPLORATORY LAPAROTOMY/ UMBILECTOMY;  Surgeon: Robert Bellow, MD;  Location: [facility] ORS;  Service: General;  Laterality: N/A;  . TONSILLECTOMY    . VENTRAL HERNIA REPAIR N/A 03/07/2018   Procedure: HERNIA REPAIR VENTRAL ADULT;  Surgeon: Robert Bellow, MD;  Location: [facility] ORS;  Service: General;  Laterality: N/A;   Social History   Social History Narrative  . Not on file   Immunization History  Administered Date(s) Administered  . Influenza,inj,Quad PF,6+ Mos 04/24/2016  . Moderna SARS-COVID-2 Vaccination 10/25/2019  . Pneumococcal Polysaccharide-23 03/02/2018     Objective: Vital Signs: There were no vitals taken for this visit.   Physical Exam   Musculoskeletal Exam: ***  CDAI Exam: CDAI Score: -- Patient Global: --; Provider Global: -- Swollen: --; Tender: -- Joint Exam 01/14/2020   No joint exam has been documented for this visit   There is currently no information documented on the homunculus. Go to the Rheumatology activity and complete the homunculus joint exam.  Investigation: No additional findings.  Imaging: No results found.  Recent Labs: Lab Results  Component Value Date   WBC 4.6 12/22/2019   HGB 12.6 12/22/2019   PLT 190 12/22/2019   NA 144 10/03/2019   K 3.8 10/03/2019   CL 108 (H) 10/03/2019   CO2 20 10/03/2019   GLUCOSE 86 10/03/2019    BUN 17 10/03/2019   CREATININE 0.85 10/03/2019   BILITOT 0.2 10/03/2019   ALKPHOS 132 (H) 10/03/2019   AST 15 10/03/2019   ALT 18 10/03/2019   PROT 6.5 10/03/2019   ALBUMIN 3.9 10/03/2019   CALCIUM 9.5 10/03/2019   GFRAA 87 10/03/2019    Speciality Comments: PLQ Eye Exam: 07/22/2019 WNL @ [place] follow up in 6 months.  Procedures:  No procedures performed Allergies: Lactose intolerance (gi) and Sulfa antibiotics   Assessment / Plan:     Visit Diagnoses: No diagnosis found.  Orders: No orders of the defined types were placed in this encounter.  No orders of the defined types were placed in this encounter.   Face-to-face time spent with patient was *** minutes. Greater than 50% of time was spent in counseling and coordination of care.  Follow-Up Instructions: No follow-ups on file.   Ofilia Neas, PA-C  Note - This record has been created using Dragon software.  Chart creation errors have been sought, but may not always  have been located. Such creation errors do not reflect on  the standard of medical care.

## 2020-01-14 ENCOUNTER — Other Ambulatory Visit: Payer: Self-pay | Admitting: Nurse Practitioner

## 2020-01-14 ENCOUNTER — Ambulatory Visit: Payer: Medicare HMO | Admitting: Physician Assistant

## 2020-01-14 ENCOUNTER — Telehealth: Payer: Self-pay | Admitting: Nurse Practitioner

## 2020-01-14 DIAGNOSIS — N390 Urinary tract infection, site not specified: Secondary | ICD-10-CM

## 2020-01-14 DIAGNOSIS — R319 Hematuria, unspecified: Secondary | ICD-10-CM

## 2020-01-14 DIAGNOSIS — R300 Dysuria: Secondary | ICD-10-CM

## 2020-01-14 MED ORDER — PHENAZOPYRIDINE HCL 200 MG PO TABS: 200.0000 mg | ORAL_TABLET | Freq: Three times a day (TID) | ORAL | 0 refills | Status: DC | PRN

## 2020-01-14 MED ORDER — NITROFURANTOIN MONOHYD MACRO 100 MG PO CAPS: 100.0000 mg | ORAL_CAPSULE | Freq: Two times a day (BID) | ORAL | 0 refills | Status: AC

## 2020-01-14 NOTE — Telephone Encounter (Signed)
Please let her know that I have Started macrobid 100mg  twice daily for 10 days. Also added pyridium 200mg  which may be taken up to three times daily as needed for bladder pain/spasms. The pyridium can turn the urine orange so she should wear a pantiliner to protect her clothing. Thanks.

## 2020-01-14 NOTE — Progress Notes (Signed)
Started macrobid 100mg  twice daily for 10 days. Also added pyridium 200mg  which may be taken up to three times daily as needed for bladder pain/spasms.

## 2020-01-20 ENCOUNTER — Other Ambulatory Visit: Payer: Self-pay

## 2020-01-20 ENCOUNTER — Ambulatory Visit: Payer: Medicare HMO | Admitting: Rheumatology

## 2020-01-20 DIAGNOSIS — Z79899 Other long term (current) drug therapy: Secondary | ICD-10-CM | POA: Diagnosis not present

## 2020-01-20 MED ORDER — TIZANIDINE HCL 4 MG PO TABS: 4.0000 mg | ORAL_TABLET | Freq: Three times a day (TID) | ORAL | 1 refills | Status: DC

## 2020-01-21 ENCOUNTER — Ambulatory Visit: Payer: Medicare HMO | Admitting: Physician Assistant

## 2020-01-22 DIAGNOSIS — M810 Age-related osteoporosis without current pathological fracture: Secondary | ICD-10-CM | POA: Diagnosis not present

## 2020-01-22 NOTE — Progress Notes (Deleted)
Office Visit Note  Patient: Jamie Bennett             Date of Birth: [birth year]           MRN: [id number]             PCP: Ronnell Freshwater, NP Referring: Ronnell Freshwater, NP Visit Date: 01/27/2020 Occupation: @GUAROCC @  Subjective:  No chief complaint on file.   History of Present Illness: Jamie Bennett is a 59 y.o. female ***   Activities of Daily Living:  Patient reports morning stiffness for *** {minute/hour:19697}.   Patient {ACTIONS;DENIES/REPORTS:21021675::"Denies"} nocturnal pain.  Difficulty dressing/grooming: {ACTIONS;DENIES/REPORTS:21021675::"Denies"} Difficulty climbing stairs: {ACTIONS;DENIES/REPORTS:21021675::"Denies"} Difficulty getting out of chair: {ACTIONS;DENIES/REPORTS:21021675::"Denies"} Difficulty using hands for taps, buttons, cutlery, and/or writing: {ACTIONS;DENIES/REPORTS:21021675::"Denies"}  No Rheumatology ROS completed.   PMFS History:  Patient Active Problem List   Diagnosis Date Noted  . Bipolar affective disorder, current episode mixed ([place]) 11/09/2019  . Encounter for general adult medical examination with abnormal findings 06/15/2019  . Closed fracture of left elbow 06/15/2019  . Urinary tract infection with hematuria 06/15/2019  . Encounter for long-term (current) use of medications 06/15/2019  . Acute upper respiratory infection 06/06/2019  . Exposure to COVID-19 virus 06/06/2019  . Exercise-induced asthma 12/15/2018  . Screening for breast cancer 05/29/2018  . Duodenal ulcer disease 05/29/2018  . Screening for malignant neoplasm of cervix 05/29/2018  . Dysuria 05/29/2018  . Surgical wound dehiscence, initial encounter 04/14/2018  . Postoperative abdominal hernia with obstruction   . Incarcerated ventral hernia 03/22/2018  . SBO (small bowel obstruction) ([place]) 03/07/2018  . Abscess of abdominal wall 03/01/2018  . Persistent umbilical sinus 17/51/0258  . Ringworm of body 01/23/2018  . Atopic dermatitis 12/05/2017  .  Adjustment disorder with mixed anxiety and depressed mood 03/10/2017  . Major depressive disorder 03/10/2017  . Primary osteoarthritis of both knees 01/19/2017  . Suicide attempt ([place]) 07/10/2016  . Fibromyalgia 07/10/2016  . High risk medication use 07/10/2016  . AKI (acute kidney injury) ([place]) 11/09/2015  . Elevated troponin 11/09/2015  . Hypotension 11/09/2015  . Respiratory failure ([place])   . Acute hepatic failure 11/08/2015  . Drug overdose 11/08/2015  . Fatty infiltration of liver 02/16/2015  . Hepatic fibrosis 02/16/2015  . Abnormal serum level of alkaline phosphatase 02/15/2015  . Iron deficiency anemia 02/05/2015  . Vitamin B 12 deficiency 02/05/2015  . OP (osteoporosis) 06/16/2014  . Rheumatoid arteritis ([place]) 03/02/2014  . Hypothyroidism 03/02/2014  . Rheumatic fever without heart involvement 03/02/2014  . Adult hypothyroidism 03/02/2014  . Arthritis of pelvic region, degenerative 03/02/2014  . Bipolar 1 disorder, depressed ([place]) 02/26/2014  . Bariatric surgery status 11/24/2013  . Affective bipolar disorder ([place]) 11/24/2013  . Bipolar affective disorder ([place]) 11/24/2013  . Rheumatoid arthritis ([place]) 11/24/2013  . Polysubstance (excluding opioids) dependence ([name]) 09/11/2013  . Polysubstance dependence ([place]) 09/11/2013  . Combined drug dependence excluding opioids ([place]) 09/11/2013  . Arthritis or polyarthritis, rheumatoid ([place]) 09/05/2013  . Leg weakness 09/05/2013    Past Medical History:  Diagnosis Date  . Anemia   . Anxiety   . Bipolar 1 disorder ([place])   . Collagen vascular disease ([place])    rhematoid arthritis  . Constipation   . Fibromyalgia   . GERD (gastroesophageal reflux disease)   . Heart murmur    mild-asymptomatic  . Hepatic steatosis   . History of Roux-en-Y gastric bypass   . Hypotension   . Hypothyroidism   . Opioid abuse ([place])   .  Osteoarthritis   . Osteoporosis   . PONV (postoperative nausea and vomiting)    nausea only  . Rheumatic  fever   . Rheumatoid arthritis ([place])   . Thyroid disease     Family History  Problem Relation Age of Onset  . Depression Mother   . Dementia Mother   . Heart disease Mother   . Osteoporosis Mother   . Parkinson's disease Father   . Colon cancer Neg Hx    Past Surgical History:  Procedure Laterality Date  . CHOLECYSTECTOMY  2004  . COLONOSCOPY N/A 07/16/2017   Procedure: COLONOSCOPY;  Surgeon: Manya Silvas, MD;  Location: [facility] ENDOSCOPY;  Service: Endoscopy;  Laterality: N/A;  . EXPLORATORY LAPAROTOMY  2009  . GASTRIC BYPASS  2003   [place] N/A 03/01/2018   Procedure: EXPLORATORY LAPAROTOMY/ UMBILECTOMY;  Surgeon: Robert Bellow, MD;  Location: [facility] ORS;  Service: General;  Laterality: N/A;  . TONSILLECTOMY    . VENTRAL HERNIA REPAIR N/A 03/07/2018   Procedure: HERNIA REPAIR VENTRAL ADULT;  Surgeon: Robert Bellow, MD;  Location: [facility] ORS;  Service: General;  Laterality: N/A;   Social History   Social History Narrative  . Not on file   Immunization History  Administered Date(s) Administered  . Influenza,inj,Quad PF,6+ Mos 04/24/2016  . Moderna SARS-COVID-2 Vaccination 10/25/2019  . Pneumococcal Polysaccharide-23 03/02/2018     Objective: Vital Signs: There were no vitals taken for this visit.   Physical Exam   Musculoskeletal Exam: ***  CDAI Exam: CDAI Score: -- Patient Global: --; Provider Global: -- Swollen: --; Tender: -- Joint Exam 01/27/2020   No joint exam has been documented for this visit   There is currently no information documented on the homunculus. Go to the Rheumatology activity and complete the homunculus joint exam.  Investigation: No additional findings.  Imaging: No results found.  Recent Labs: Lab Results  Component Value Date   WBC 4.6 12/22/2019   HGB 12.6 12/22/2019   PLT 190 12/22/2019   NA 144 10/03/2019   K 3.8 10/03/2019   CL 108 (H) 10/03/2019   CO2 20 10/03/2019   GLUCOSE 86 10/03/2019    BUN 17 10/03/2019   CREATININE 0.85 10/03/2019   BILITOT 0.2 10/03/2019   ALKPHOS 132 (H) 10/03/2019   AST 15 10/03/2019   ALT 18 10/03/2019   PROT 6.5 10/03/2019   ALBUMIN 3.9 10/03/2019   CALCIUM 9.5 10/03/2019   GFRAA 87 10/03/2019    Speciality Comments: PLQ Eye Exam: 07/22/2019 WNL @ [place] follow up in 6 months.  Procedures:  No procedures performed Allergies: Lactose intolerance (gi) and Sulfa antibiotics   Assessment / Plan:     Visit Diagnoses: No diagnosis found.  Orders: No orders of the defined types were placed in this encounter.  No orders of the defined types were placed in this encounter.   Face-to-face time spent with patient was *** minutes. Greater than 50% of time was spent in counseling and coordination of care.  Follow-Up Instructions: No follow-ups on file.   Earnestine Mealing, CMA  Note - This record has been created using Editor, commissioning.  Chart creation errors have been sought, but may not always  have been located. Such creation errors do not reflect on  the standard of medical care.

## 2020-01-27 ENCOUNTER — Ambulatory Visit: Payer: Medicare HMO | Admitting: Physician Assistant

## 2020-01-27 ENCOUNTER — Encounter: Payer: Self-pay | Admitting: Nurse Practitioner

## 2020-01-27 ENCOUNTER — Encounter: Payer: Self-pay | Admitting: Physician Assistant

## 2020-01-27 ENCOUNTER — Ambulatory Visit (INDEPENDENT_AMBULATORY_CARE_PROVIDER_SITE_OTHER): Payer: Medicare HMO | Admitting: Nurse Practitioner

## 2020-01-27 ENCOUNTER — Other Ambulatory Visit: Payer: Self-pay

## 2020-01-27 VITALS — BP 144/77 | HR 78 | Temp 97.3°F | Resp 16 | Ht 65.0 in | Wt 348.0 lb

## 2020-01-27 VITALS — BP 119/79 | HR 76 | Resp 18 | Ht 65.0 in | Wt 346.6 lb

## 2020-01-27 DIAGNOSIS — M818 Other osteoporosis without current pathological fracture: Secondary | ICD-10-CM | POA: Diagnosis not present

## 2020-01-27 DIAGNOSIS — M0579 Rheumatoid arthritis with rheumatoid factor of multiple sites without organ or systems involvement: Secondary | ICD-10-CM

## 2020-01-27 DIAGNOSIS — Z79899 Other long term (current) drug therapy: Secondary | ICD-10-CM

## 2020-01-27 DIAGNOSIS — M170 Bilateral primary osteoarthritis of knee: Secondary | ICD-10-CM | POA: Diagnosis not present

## 2020-01-27 DIAGNOSIS — Z8719 Personal history of other diseases of the digestive system: Secondary | ICD-10-CM

## 2020-01-27 DIAGNOSIS — M62838 Other muscle spasm: Secondary | ICD-10-CM

## 2020-01-27 DIAGNOSIS — M7062 Trochanteric bursitis, left hip: Secondary | ICD-10-CM

## 2020-01-27 DIAGNOSIS — Z862 Personal history of diseases of the blood and blood-forming organs and certain disorders involving the immune mechanism: Secondary | ICD-10-CM | POA: Diagnosis not present

## 2020-01-27 DIAGNOSIS — N390 Urinary tract infection, site not specified: Secondary | ICD-10-CM

## 2020-01-27 DIAGNOSIS — M5136 Other intervertebral disc degeneration, lumbar region: Secondary | ICD-10-CM | POA: Diagnosis not present

## 2020-01-27 DIAGNOSIS — M5134 Other intervertebral disc degeneration, thoracic region: Secondary | ICD-10-CM

## 2020-01-27 DIAGNOSIS — Z8659 Personal history of other mental and behavioral disorders: Secondary | ICD-10-CM

## 2020-01-27 DIAGNOSIS — Z8639 Personal history of other endocrine, nutritional and metabolic disease: Secondary | ICD-10-CM

## 2020-01-27 DIAGNOSIS — M797 Fibromyalgia: Secondary | ICD-10-CM

## 2020-01-27 DIAGNOSIS — M5030 Other cervical disc degeneration, unspecified cervical region: Secondary | ICD-10-CM | POA: Diagnosis not present

## 2020-01-27 DIAGNOSIS — R300 Dysuria: Secondary | ICD-10-CM | POA: Diagnosis not present

## 2020-01-27 LAB — POCT URINALYSIS DIPSTICK
Glucose, UA: NEGATIVE
Ketones, UA: NEGATIVE
Nitrite, UA: POSITIVE
Protein, UA: POSITIVE — AB
Spec Grav, UA: 1.01 (ref 1.010–1.025)
Urobilinogen, UA: 0.2 E.U./dL
pH, UA: 6.5 (ref 5.0–8.0)

## 2020-01-27 MED ORDER — LEVOFLOXACIN 500 MG PO TABS: 500.0000 mg | ORAL_TABLET | Freq: Every day | ORAL | 0 refills | Status: DC

## 2020-01-27 NOTE — Addendum Note (Signed)
Addended by: Shona Needles on: 01/27/2020 05:12 PM   Modules accepted: Orders

## 2020-01-27 NOTE — Progress Notes (Signed)
Office Visit Note  Patient: Jamie Bennett             Date of Birth: 05/05/1961           MRN: [id number]             PCP: Ronnell Freshwater, NP Referring: Ronnell Freshwater, NP Visit Date: 01/27/2020 Occupation: @GUAROCC @  Subjective:  Left trochanteric bursitis   History of Present Illness: Jamie Bennett is a 59 y.o. female with history of seropositive rheumatoid arthritis and osteoarthritis.  She is taking plaquenil 200 mg 1 tablet by mouth twice daily M-F.  She is tolerating PLQ without side effects. She has been having increased pain in the right hand but denies any joint swelling.  She presents today with trochanteric bursitis of the left hip.  She denies any groin pain at this time.     Activities of Daily Living:  Patient reports morning stiffness for 1.5  hours.   Patient Denies nocturnal pain.  Difficulty dressing/grooming: Denies Difficulty climbing stairs: Reports Difficulty getting out of chair: Reports Difficulty using hands for taps, buttons, cutlery, and/or writing: Denies  Review of Systems  Constitutional: Positive for fatigue.  HENT: Negative for mouth sores, mouth dryness and nose dryness.   Eyes: Positive for dryness. Negative for pain and visual disturbance.  Respiratory: Positive for shortness of breath. Negative for cough, hemoptysis and difficulty breathing.   Cardiovascular: Negative for chest pain, palpitations, hypertension and swelling in legs/feet.  Gastrointestinal: Positive for constipation. Negative for blood in stool and diarrhea.  Genitourinary: Positive for painful urination.  Musculoskeletal: Positive for arthralgias, joint pain, joint swelling and morning stiffness. Negative for myalgias, muscle weakness, muscle tenderness and myalgias.  Skin: Negative for color change, pallor, rash, hair loss, nodules/bumps, skin tightness, ulcers and sensitivity to sunlight.  Neurological: Negative for dizziness, numbness, headaches and  weakness.  Hematological: Positive for bruising/bleeding tendency. Negative for swollen glands.  Psychiatric/Behavioral: Negative for depressed mood and sleep disturbance. The patient is not nervous/anxious.     PMFS History:  Patient Active Problem List   Diagnosis Date Noted  . Bipolar affective disorder, current episode mixed ([place]) 11/09/2019  . Encounter for general adult medical examination with abnormal findings 06/15/2019  . Closed fracture of left elbow 06/15/2019  . Urinary tract infection with hematuria 06/15/2019  . Encounter for long-term (current) use of medications 06/15/2019  . Acute upper respiratory infection 06/06/2019  . Exposure to COVID-19 virus 06/06/2019  . Exercise-induced asthma 12/15/2018  . Screening for breast cancer 05/29/2018  . Duodenal ulcer disease 05/29/2018  . Screening for malignant neoplasm of cervix 05/29/2018  . Dysuria 05/29/2018  . Surgical wound dehiscence, initial encounter 04/14/2018  . Postoperative abdominal hernia with obstruction   . Incarcerated ventral hernia 03/22/2018  . SBO (small bowel obstruction) ([place]) 03/07/2018  . Abscess of abdominal wall 03/01/2018  . Persistent umbilical sinus 17/51/0258  . Ringworm of body 01/23/2018  . Atopic dermatitis 12/05/2017  . Adjustment disorder with mixed anxiety and depressed mood 03/10/2017  . Major depressive disorder 03/10/2017  . Primary osteoarthritis of both knees 01/19/2017  . Suicide attempt ([place]) 07/10/2016  . Fibromyalgia 07/10/2016  . High risk medication use 07/10/2016  . AKI (acute kidney injury) ([place]) 11/09/2015  . Elevated troponin 11/09/2015  . Hypotension 11/09/2015  . Respiratory failure ([place])   . Acute hepatic failure 11/08/2015  . Drug overdose 11/08/2015  . Fatty infiltration of liver 02/16/2015  . Hepatic fibrosis 02/16/2015  . Abnormal  serum level of alkaline phosphatase 02/15/2015  . Iron deficiency anemia 02/05/2015  . Vitamin B 12 deficiency 02/05/2015  . OP  (osteoporosis) 06/16/2014  . Rheumatoid arteritis ([place]) 03/02/2014  . Hypothyroidism 03/02/2014  . Rheumatic fever without heart involvement 03/02/2014  . Adult hypothyroidism 03/02/2014  . Arthritis of pelvic region, degenerative 03/02/2014  . Bipolar 1 disorder, depressed ([place]) 02/26/2014  . Bariatric surgery status 11/24/2013  . Affective bipolar disorder ([place]) 11/24/2013  . Bipolar affective disorder ([place]) 11/24/2013  . Rheumatoid arthritis ([place]) 11/24/2013  . Polysubstance (excluding opioids) dependence ([place]) 09/11/2013  . Polysubstance dependence ([place]) 09/11/2013  . Combined drug dependence excluding opioids ([place]) 09/11/2013  . Arthritis or polyarthritis, rheumatoid ([place]) 09/05/2013  . Leg weakness 09/05/2013    Past Medical History:  Diagnosis Date  . Anemia   . Anxiety   . Bipolar 1 disorder ([place])   . Collagen vascular disease ([place])    rhematoid arthritis  . Constipation   . Fibromyalgia   . GERD (gastroesophageal reflux disease)   . Heart murmur    mild-asymptomatic  . Hepatic steatosis   . History of Roux-en-Y gastric bypass   . Hypotension   . Hypothyroidism   . Opioid abuse ([place])   . Osteoarthritis   . Osteoporosis   . PONV (postoperative nausea and vomiting)    nausea only  . Rheumatic fever   . Rheumatoid arthritis ([place])   . Thyroid disease     Family History  Problem Relation Age of Onset  . Depression Mother   . Dementia Mother   . Heart disease Mother   . Osteoporosis Mother   . Parkinson's disease Father   . Colon cancer Neg Hx    Past Surgical History:  Procedure Laterality Date  . CHOLECYSTECTOMY  2004  . COLONOSCOPY N/A 07/16/2017   Procedure: COLONOSCOPY;  Surgeon: Manya Silvas, MD;  Location: [facility] ENDOSCOPY;  Service: Endoscopy;  Laterality: N/A;  . EXPLORATORY LAPAROTOMY  2009  . GASTRIC BYPASS  2003   [place] N/A 03/01/2018   Procedure: EXPLORATORY LAPAROTOMY/ UMBILECTOMY;  Surgeon: Robert Bellow, MD;   Location: [facility] ORS;  Service: General;  Laterality: N/A;  . TONSILLECTOMY    . VENTRAL HERNIA REPAIR N/A 03/07/2018   Procedure: HERNIA REPAIR VENTRAL ADULT;  Surgeon: Robert Bellow, MD;  Location: [facility] ORS;  Service: General;  Laterality: N/A;   Social History   Social History Narrative  . Not on file   Immunization History  Administered Date(s) Administered  . Influenza,inj,Quad PF,6+ Mos 04/24/2016  . Moderna SARS-COVID-2 Vaccination 10/25/2019, 11/22/2019  . Pneumococcal Polysaccharide-23 03/02/2018     Objective: Vital Signs: BP 119/79 (BP Location: Left Wrist, Patient Position: Sitting, Cuff Size: Normal)   Pulse 76   Resp 18   Ht 5\' 5"  (1.651 m)   Wt (!) 346 lb 9.6 oz (157.2 kg)   BMI 57.68 kg/m    Physical Exam Vitals and nursing note reviewed.  Constitutional:      Appearance: She is well-developed.  HENT:     Head: Normocephalic and atraumatic.  Eyes:     Conjunctiva/sclera: Conjunctivae normal.  Pulmonary:     Effort: Pulmonary effort is normal.  Abdominal:     General: Bowel sounds are normal.     Palpations: Abdomen is soft.  Musculoskeletal:     Cervical back: Normal range of motion.  Lymphadenopathy:     Cervical: No cervical adenopathy.  Skin:    General: Skin is warm  and dry.     Capillary Refill: Capillary refill takes less than 2 seconds.  Neurological:     Mental Status: She is alert and oriented to person, place, and time.  Psychiatric:        Behavior: Behavior normal.      Musculoskeletal Exam: C-spine good ROM.  Trapezius muscle tension and tenderness bilaterally.  Thoracic kyphosis note.  Shoulder joints, elbow joints, wrist joints, MCPs PIPs and DIPs with good range of motion with no synovitis.  She has some tenderness on palpation over right second and third PIP joints.  Hip joints, knee joints, ankles with good range of motion.  She has tenderness over bilateral trochanteric bursa.      CDAI Exam: CDAI Score: 3  Patient Global: 6  mm; Provider Global: 4 mm Swollen: 0 ; Tender: 2  Joint Exam 01/27/2020      Right  Left  PIP 2   Tender     PIP 3   Tender        Investigation: No additional findings.  Imaging: No results found.  Recent Labs: Lab Results  Component Value Date   WBC 4.6 12/22/2019   HGB 12.6 12/22/2019   PLT 190 12/22/2019   NA 144 10/03/2019   K 3.8 10/03/2019   CL 108 (H) 10/03/2019   CO2 20 10/03/2019   GLUCOSE 86 10/03/2019   BUN 17 10/03/2019   CREATININE 0.85 10/03/2019   BILITOT 0.2 10/03/2019   ALKPHOS 132 (H) 10/03/2019   AST 15 10/03/2019   ALT 18 10/03/2019   PROT 6.5 10/03/2019   ALBUMIN 3.9 10/03/2019   CALCIUM 9.5 10/03/2019   GFRAA 87 10/03/2019    Speciality Comments: PLQ Eye Exam: 07/22/2019 WNL @ [place] follow up in 6 months.  Procedures:  No procedures performed Allergies: Lactose intolerance (gi) and Sulfa antibiotics   Assessment / Plan:     Visit Diagnoses: Rheumatoid arthritis involving multiple sites with positive rheumatoid factor (HCC)-patient continues to complain of discomfort in her joints.  She had no synovitis on examination today.  I have advised her to continue with Plaquenil.  She has tried methotrexate in the past without any benefit and side effects.  We offered repeat x-rays to monitor for disease progression but she declined x-rays.  High risk medication use - Plaquenil 200 mg 1 tablet twice daily Monday-Friday only.  Last Plaquenil eye exam normal on 07/22/2019.  Her labs are within normal limits.  Fibromyalgia - Zanaflex 4 mg 1 tablet by mouth at bedtime for muscle spasms.  She continues to have some generalized pain and discomfort.  Trapezius muscle spasm-stretching exercises were discussed.  She also will benefit from massage.  Left trochanteric bursitis-patient is having increased pain and discomfort.  We will refer her to Dr. Ernestina Patches for injection under fluoroscopy.  Primary osteoarthritis of both knees-she has chronic  discomfort.  She has done well with Visco supplement injections in the past.  DDD (degenerative disc disease), cervical-chronic pain  DDD (degenerative disc disease), thoracic-chronic pain  DDD (degenerative disc disease), lumbar-chronic pain  Other osteoporosis without current pathological fracture-she is on Prolia.  History of anemia  History of gastroesophageal reflux (GERD)  History of bipolar disorder  History of hypothyroidism  Orders: No orders of the defined types were placed in this encounter.  No orders of the defined types were placed in this encounter.    Follow-Up Instructions: Return in about 5 months (around 06/28/2020) for Rheumatoid arthritis, Fibromyalgia, Osteoarthritis.  Hazel Sams, PA-C  I examined and evaluated the patient with Hazel Sams PA.  Patient had no synovitis on examination.  She declined x-ray of hands.  We will refer her to Dr. Ernestina Patches for left trochanteric bursa injection.  The plan of care was discussed as noted above.  Bo Merino, MD  Note - This record has been created using Editor, commissioning.  Chart creation errors have been sought, but may not always  have been located. Such creation errors do not reflect on  the standard of medical care.

## 2020-01-27 NOTE — Progress Notes (Signed)
[facility] [place], [place] [address]  Internal MEDICINE  Office Visit Note  Patient Name: Jamie Bennett  [id number]  [id number]  Date of Service: 02/02/2020  Chief Complaint  Patient presents with  . Follow-up    still having uti symptoms   . Anxiety     The patient is here for acute visit. She is c/o pain and frequency with urination. She has some pelvic pain as well. States that this has been going on for several days. Symptoms are gradually getting worse. She was initially treated for this with macrobid twice daily. She states that symptoms did get better after treatment, but came right back after she completed antibiotics.   Pt is here for a sick visit.     Current Medication:  Outpatient Encounter Medications as of 01/27/2020  Medication Sig  . albuterol (VENTOLIN HFA) 108 (90 Base) MCG/ACT inhaler Inhale 2 puffs into the lungs every 6 (six) hours as needed for wheezing or shortness of breath.  Marland Kitchen aspirin EC 81 MG tablet Take 81 mg by mouth daily.  . busPIRone (BUSPAR) 30 MG tablet 1 bid  . Cholecalciferol (HM VITAMIN D3) 4000 units CAPS Take 4,000 Units by mouth daily.   . clonazePAM (KLONOPIN) 0.5 MG tablet Take one tablet by mouth for acute anxiety, EMERGENCIES only - do not exceed 1 tab daily  . Coenzyme Q-10 200 MG CAPS Take 200 mg by mouth 2 (two) times daily.   . cyanocobalamin (,VITAMIN B-12,) 1000 MCG/ML injection Inject 1,000 mcg into the muscle every 3 (three) months.   Marland Kitchen denosumab (PROLIA) 60 MG/ML SOSY injection Inject 60 mg into the skin every 6 (six) months.  . desipramine (NORPRAMIN) 25 MG tablet 1  qam  . esomeprazole (NEXIUM) 20 MG capsule Take 20 mg by mouth daily.   . Fiber, Guar Gum, CHEW Chew 5 mg by mouth daily.   . hydroxychloroquine (PLAQUENIL) 200 MG tablet TAKE ONE TABLET BY MOUTH TWICE A DAY MONDAY-FRIDAY FOR RHEUMATOID ARTHRITIS  . hydrOXYzine (ATARAX/VISTARIL) 50 MG tablet Take 1 tablet (50 mg total) by mouth  3 (three) times daily as needed for anxiety.  Marland Kitchen ibuprofen (ADVIL,MOTRIN) 100 MG tablet Take 100 mg by mouth as needed for fever.  Javier Docker Oil 500 MG CAPS Take 500 mg by mouth daily.  Marland Kitchen lamoTRIgine (LAMICTAL) 150 MG tablet Take 1 tablet (150 mg total) by mouth 2 (two) times daily.  Marland Kitchen levothyroxine (SYNTHROID) 112 MCG tablet Take 1 tablet (112 mcg total) by mouth daily.  Marland Kitchen loratadine (CLARITIN) 10 MG tablet Take 10 mg by mouth daily as needed for allergies.  . polyethylene glycol (MIRALAX) packet Take 17 g by mouth daily.  . sucralfate (CARAFATE) 1 g tablet Take 1 tablet (1 g total) by mouth 2 (two) times daily.  Marland Kitchen tiZANidine (ZANAFLEX) 4 MG tablet Take 1 tablet (4 mg total) by mouth 3 (three) times daily.  . traZODone (DESYREL) 100 MG tablet Take 1 tablet (100 mg total) by mouth at bedtime as needed and may repeat dose one time if needed for sleep.  Marland Kitchen venlafaxine (EFFEXOR) 100 MG tablet Take 1 tablet (100 mg total) by mouth 3 (three) times daily with meals.  Marland Kitchen levofloxacin (LEVAQUIN) 500 MG tablet Take 1 tablet (500 mg total) by mouth daily.  . [DISCONTINUED] levofloxacin (LEVAQUIN) 500 MG tablet Take 1 tablet (500 mg total) by mouth daily.   No facility-administered encounter medications on file as of 01/27/2020.  Medical History: Past Medical History:  Diagnosis Date  . Anemia   . Anxiety   . Bipolar 1 disorder ([place])   . Collagen vascular disease ([place])    rhematoid arthritis  . Constipation   . Fibromyalgia   . GERD (gastroesophageal reflux disease)   . Heart murmur    mild-asymptomatic  . Hepatic steatosis   . History of Roux-en-Y gastric bypass   . Hypotension   . Hypothyroidism   . Opioid abuse ([place])   . Osteoarthritis   . Osteoporosis   . PONV (postoperative nausea and vomiting)    nausea only  . Rheumatic fever   . Rheumatoid arthritis ([place])   . Thyroid disease      Today's Vitals   01/27/20 1607  BP: (!) 144/77  Pulse: 78  Resp: 16  Temp: (!) 97.3 F  (36.3 C)  SpO2: 96%  Weight: (!) 348 lb (157.9 kg)  Height: 5\' 5"  (1.651 m)   Body mass index is 57.91 kg/m.  Review of Systems  Constitutional: Positive for fatigue. Negative for activity change, chills and unexpected weight change.  HENT: Negative for congestion, postnasal drip, rhinorrhea, sneezing and sore throat.   Respiratory: Negative for cough, chest tightness and shortness of breath.   Cardiovascular: Negative for chest pain and palpitations.  Gastrointestinal: Negative for abdominal pain, constipation, diarrhea, nausea and vomiting.  Genitourinary: Positive for dysuria, frequency, pelvic pain and urgency.  Musculoskeletal: Positive for back pain. Negative for arthralgias, joint swelling and neck pain.  Skin: Negative for rash.  Neurological: Positive for headaches. Negative for dizziness, tremors and numbness.  Hematological: Negative for adenopathy. Does not bruise/bleed easily.  Psychiatric/Behavioral: Negative for behavioral problems (Depression), sleep disturbance and suicidal ideas. The patient is not nervous/anxious.     Physical Exam Vitals and nursing note reviewed.  Constitutional:      General: She is not in acute distress.    Appearance: Normal appearance. She is well-developed. She is obese. She is not diaphoretic.  HENT:     Head: Normocephalic and atraumatic.     Mouth/Throat:     Pharynx: No oropharyngeal exudate.  Eyes:     Pupils: Pupils are equal, round, and reactive to light.  Neck:     Thyroid: No thyromegaly.     Vascular: No JVD.     Trachea: No tracheal deviation.  Cardiovascular:     Rate and Rhythm: Normal rate and regular rhythm.     Heart sounds: Normal heart sounds. No murmur heard.  No friction rub. No gallop.   Pulmonary:     Effort: Pulmonary effort is normal. No respiratory distress.     Breath sounds: Normal breath sounds. No wheezing or rales.  Chest:     Chest wall: No tenderness.  Abdominal:     General: Bowel sounds are  normal.     Palpations: Abdomen is soft.  Genitourinary:    Comments: Urine sample showing moderate WBC. It is positive for protein and nitrites.  Musculoskeletal:        General: Normal range of motion.     Cervical back: Normal range of motion and neck supple.  Lymphadenopathy:     Cervical: No cervical adenopathy.  Skin:    General: Skin is warm and dry.  Neurological:     Mental Status: She is alert and oriented to person, place, and time.     Cranial Nerves: No cranial nerve deficit.  Psychiatric:        Mood and Affect: Mood  normal.        Behavior: Behavior normal.        Thought Content: Thought content normal.        Judgment: Judgment normal.   Assessment/Plan: 1. Urinary tract infection without hematuria, site unspecified Start levofloxacin 500mg  daily for 1 days. Send urine sample for culture and sensitivity and adjust antibiotics as indicated.  - CULTURE, URINE COMPREHENSIVE - levofloxacin (LEVAQUIN) 500 MG tablet; Take 1 tablet (500 mg total) by mouth daily.  Dispense: 10 tablet; Refill: 0  2. Dysuria Recommend use of AZO as needed for bladder pain and spasms.  - POCT Urinalysis Dipstick  General Counseling: Anupama verbalizes understanding of the findings of todays visit and agrees with plan of treatment. I have discussed any further diagnostic evaluation that may be needed or ordered today. We also reviewed her medications today. she has been encouraged to call the office with any questions or concerns that should arise related to todays visit.    Counseling:  This patient was seen by Leretha Pol FNP Collaboration with Dr Lavera Guise as a part of collaborative care agreement  Orders Placed This Encounter  Procedures  . CULTURE, URINE COMPREHENSIVE  . POCT Urinalysis Dipstick    Meds ordered this encounter  Medications  . DISCONTD: levofloxacin (LEVAQUIN) 500 MG tablet    Sig: Take 1 tablet (500 mg total) by mouth daily.    Dispense:  10 tablet     Refill:  0    Order Specific Question:   Supervising Provider    Answer:   Lavera Guise [[id number]]  . levofloxacin (LEVAQUIN) 500 MG tablet    Sig: Take 1 tablet (500 mg total) by mouth daily.    Dispense:  10 tablet    Refill:  0    Accidentally sent prescription to [facility]. Patient does have GoodRx coupon for this medication.    Order Specific Question:   Supervising Provider    Answer:   Lavera Guise [[id number]]    Time spent: 30 Minutes

## 2020-01-28 ENCOUNTER — Ambulatory Visit: Payer: Medicare HMO | Admitting: Physician Assistant

## 2020-01-30 LAB — CULTURE, URINE COMPREHENSIVE

## 2020-01-30 NOTE — Progress Notes (Signed)
Patient started on levofloxacin at time of visit.

## 2020-02-03 ENCOUNTER — Other Ambulatory Visit: Payer: Self-pay

## 2020-02-03 ENCOUNTER — Telehealth: Payer: Self-pay | Admitting: Physical Medicine and Rehabilitation

## 2020-02-03 MED ORDER — LEVOTHYROXINE SODIUM 112 MCG PO TABS: 112.0000 ug | ORAL_TABLET | Freq: Every day | ORAL | 1 refills | Status: DC

## 2020-02-03 NOTE — Telephone Encounter (Signed)
Documentation in referral. 

## 2020-02-03 NOTE — Telephone Encounter (Signed)
Patient called asked for a call back to schedule an appointment with Dr. Ernestina Patches. Referred by Dr Estanislado Pandy. The number to contact patient is [phone] or [phone]

## 2020-02-04 ENCOUNTER — Other Ambulatory Visit: Payer: Self-pay

## 2020-02-04 MED ORDER — LEVOTHYROXINE SODIUM 112 MCG PO TABS: 112.0000 ug | ORAL_TABLET | Freq: Every day | ORAL | 1 refills | Status: DC

## 2020-02-06 NOTE — Telephone Encounter (Signed)
From referral documentation patient was called with voicemail #1 on 6/30

## 2020-02-11 ENCOUNTER — Ambulatory Visit (HOSPITAL_COMMUNITY): Payer: Medicare HMO | Admitting: Psychiatry

## 2020-02-13 ENCOUNTER — Telehealth (INDEPENDENT_AMBULATORY_CARE_PROVIDER_SITE_OTHER): Payer: Medicare HMO | Admitting: Psychiatry

## 2020-02-13 ENCOUNTER — Other Ambulatory Visit: Payer: Self-pay

## 2020-02-13 DIAGNOSIS — F411 Generalized anxiety disorder: Secondary | ICD-10-CM

## 2020-02-13 DIAGNOSIS — F3342 Major depressive disorder, recurrent, in full remission: Secondary | ICD-10-CM

## 2020-02-13 DIAGNOSIS — R69 Illness, unspecified: Secondary | ICD-10-CM | POA: Diagnosis not present

## 2020-02-13 DIAGNOSIS — F332 Major depressive disorder, recurrent severe without psychotic features: Secondary | ICD-10-CM | POA: Diagnosis not present

## 2020-02-13 DIAGNOSIS — F603 Borderline personality disorder: Secondary | ICD-10-CM

## 2020-02-13 MED ORDER — DESIPRAMINE HCL 25 MG PO TABS: ORAL_TABLET | ORAL | 3 refills | Status: DC

## 2020-02-13 MED ORDER — VENLAFAXINE HCL 100 MG PO TABS: 100.0000 mg | ORAL_TABLET | Freq: Three times a day (TID) | ORAL | 4 refills | Status: DC

## 2020-02-13 NOTE — Progress Notes (Signed)
`  Psychiatric Initial Adult Assessment   Patient Identification: Jamie Bennett MRN:  [id number] Date of Evaluation:  02/13/2020 Referral Source: grams per previous psychiatrist Chief Complaint:   Visit Diagnosis: borderline personality disorder   ICD-10-CM   1. Anxiety state  F41.1 venlafaxine (EFFEXOR) 100 MG tablet  2. Borderline personality disorder ([place])  F60.3 venlafaxine (EFFEXOR) 100 MG tablet  3. Major depressive disorder, recurrent, severe without psychotic features (HCC)  F33.2 venlafaxine (EFFEXOR) 100 MG tablet    History of Present Illness:   Today patient is doing very well.  Her mood is good.  She is being treated for a number of significant medical problems that could have psychiatric problems associated with.  She has anemia and B12 deficiency she is actively involved in her care.  Other than this she is doing fairly well.  She also has some rheumatoid arthritis and osteoarthritis which do however limit her from exercising very much.  The patient denies depression.  She denies anxiety irritability or euphoria.  She is sleeping and eating very well.  She is a reasonable amount of energy.  She loves to read doing crafts and she watches TV.  The patient drinks no alcohol uses no drugs.  Financially she is stable.  She has a good and improving relationship with her husband.  The patient had her vaccines.  Patient is very happy with life at this time.  She takes her medicines regularly.  It should be noted that she takes Effexor standard release 100 mg 3 times daily.  This is the only way she could tolerate the Effexor.  She also takes 25 mg of desipramine.  Depression Symptoms:  fatigue, (Hypo) Manic Symptoms:   Anxiety Symptoms:   Psychotic Symptoms:   PTSD Symptoms:   Past Psychiatric History: 10 psychiatric hospitalizations multiple psychotropic medications presently in psychotherapy  Previous Psychotropic Medications: Yes   Substance Abuse History in the last 12  months:  Yes.    Consequences of Substance Abuse:   Past Medical History:  Past Medical History:  Diagnosis Date   Anemia    Anxiety    Bipolar 1 disorder ([place])    Collagen vascular disease ([place])    rhematoid arthritis   Constipation    Fibromyalgia    GERD (gastroesophageal reflux disease)    Heart murmur    mild-asymptomatic   Hepatic steatosis    History of Roux-en-Y gastric bypass    Hypotension    Hypothyroidism    Opioid abuse ([place])    Osteoarthritis    Osteoporosis    PONV (postoperative nausea and vomiting)    nausea only   Rheumatic fever    Rheumatoid arthritis ([place])    Thyroid disease     Past Surgical History:  Procedure Laterality Date   CHOLECYSTECTOMY  2004   COLONOSCOPY N/A 07/16/2017   Procedure: COLONOSCOPY;  Surgeon: Manya Silvas, MD;  Location: [facility] ENDOSCOPY;  Service: Endoscopy;  Laterality: N/A;   EXPLORATORY LAPAROTOMY  2009   GASTRIC BYPASS  2003   [place] N/A 03/01/2018   Procedure: EXPLORATORY LAPAROTOMY/ UMBILECTOMY;  Surgeon: Robert Bellow, MD;  Location: [facility] ORS;  Service: General;  Laterality: N/A;   TONSILLECTOMY     VENTRAL HERNIA REPAIR N/A 03/07/2018   Procedure: HERNIA REPAIR VENTRAL ADULT;  Surgeon: Robert Bellow, MD;  Location: [facility] ORS;  Service: General;  Laterality: N/A;    Family Psychiatric History:   Family History:  Family History  Problem Relation  Age of Onset   Depression Mother    Dementia Mother    Heart disease Mother    Osteoporosis Mother    Parkinson's disease Father    Colon cancer Neg Hx     Social History:   Social History   Socioeconomic History   Marital status: Married    Spouse name: Not on file   Number of children: Not on file   Years of education: Not on file   Highest education level: Not on file  Occupational History   Not on file  Tobacco Use   Smoking status: Never Smoker   Smokeless tobacco: Never Used   Vaping Use   Vaping Use: Never used  Substance and Sexual Activity   Alcohol use: No    Alcohol/week: 0.0 standard drinks   Drug use: No   Sexual activity: Yes  Other Topics Concern   Not on file  Social History Narrative   Not on file   Social Determinants of Health   Financial Resource Strain:    Difficulty of Paying Living Expenses:   Food Insecurity:    Worried About Charity fundraiser in the Last Year:    Arboriculturist in the Last Year:   Transportation Needs:    Film/video editor (Medical):    Lack of Transportation (Non-Medical):   Physical Activity:    Days of Exercise per Week:    Minutes of Exercise per Session:   Stress:    Feeling of Stress :   Social Connections:    Frequency of Communication with Friends and Family:    Frequency of Social Gatherings with Friends and Family:    Attends Religious Services:    Active Member of Clubs or Organizations:    Attends Archivist Meetings:    Marital Status:     Additional Social History:   Allergies:   Allergies  Allergen Reactions   Lactose Intolerance (Gi) Other (See Comments)    Bloating and GI distress   Sulfa Antibiotics Hives    Metabolic Disorder Labs: No results found for: HGBA1C, MPG No results found for: PROLACTIN Lab Results  Component Value Date   TRIG 176 (H) 11/09/2015     Current Medications: Current Outpatient Medications  Medication Sig Dispense Refill   albuterol (VENTOLIN HFA) 108 (90 Base) MCG/ACT inhaler Inhale 2 puffs into the lungs every 6 (six) hours as needed for wheezing or shortness of breath. 1 Inhaler 3   aspirin EC 81 MG tablet Take 81 mg by mouth daily.     busPIRone (BUSPAR) 30 MG tablet 1 bid 180 tablet 1   Cholecalciferol (HM VITAMIN D3) 4000 units CAPS Take 4,000 Units by mouth daily.      clonazePAM (KLONOPIN) 0.5 MG tablet Take one tablet by mouth for acute anxiety, EMERGENCIES only - do not exceed 1 tab daily 30  tablet 3   Coenzyme Q-10 200 MG CAPS Take 200 mg by mouth 2 (two) times daily.      cyanocobalamin (,VITAMIN B-12,) 1000 MCG/ML injection Inject 1,000 mcg into the muscle every 3 (three) months.      denosumab (PROLIA) 60 MG/ML SOSY injection Inject 60 mg into the skin every 6 (six) months.     desipramine (NORPRAMIN) 25 MG tablet 1  qam 90 tablet 3   esomeprazole (NEXIUM) 20 MG capsule Take 20 mg by mouth daily.      Fiber, Guar Gum, CHEW Chew 5 mg by mouth daily.  hydroxychloroquine (PLAQUENIL) 200 MG tablet TAKE ONE TABLET BY MOUTH TWICE A DAY MONDAY-FRIDAY FOR RHEUMATOID ARTHRITIS 120 tablet 0   hydrOXYzine (ATARAX/VISTARIL) 50 MG tablet Take 1 tablet (50 mg total) by mouth 3 (three) times daily as needed for anxiety. 270 tablet 1   ibuprofen (ADVIL,MOTRIN) 100 MG tablet Take 100 mg by mouth as needed for fever.     Krill Oil 500 MG CAPS Take 500 mg by mouth daily.     lamoTRIgine (LAMICTAL) 150 MG tablet Take 1 tablet (150 mg total) by mouth 2 (two) times daily. 180 tablet 1   levofloxacin (LEVAQUIN) 500 MG tablet Take 1 tablet (500 mg total) by mouth daily. 10 tablet 0   levothyroxine (SYNTHROID) 112 MCG tablet Take 1 tablet (112 mcg total) by mouth daily. 90 tablet 1   loratadine (CLARITIN) 10 MG tablet Take 10 mg by mouth daily as needed for allergies.     polyethylene glycol (MIRALAX) packet Take 17 g by mouth daily. 14 each 0   sucralfate (CARAFATE) 1 g tablet Take 1 tablet (1 g total) by mouth 2 (two) times daily. 180 tablet 3   tiZANidine (ZANAFLEX) 4 MG tablet Take 1 tablet (4 mg total) by mouth 3 (three) times daily. 90 tablet 1   traZODone (DESYREL) 100 MG tablet Take 1 tablet (100 mg total) by mouth at bedtime as needed and may repeat dose one time if needed for sleep. 60 tablet 5   venlafaxine (EFFEXOR) 100 MG tablet Take 1 tablet (100 mg total) by mouth 3 (three) times daily with meals. 90 tablet 4   No current facility-administered medications for this  visit.    Neurologic: Headache: No Seizure: No Paresthesias:NA  Musculoskeletal: Strength & Muscle Tone: within normal limits Gait & Station: normal Patient leans: N/A  Psychiatric Specialty Exam: ROS  There were no vitals taken for this visit.There is no height or weight on file to calculate BMI.  General Appearance: Bizarre  Eye Contact:  Good  Speech:  Normal Rate  Volume:  Normal  Mood:  Anxious  Affect:  Congruent  Thought Process:  Goal Directed  Orientation:  NA  Thought Content:  Logical  Suicidal Thoughts:  No  Homicidal Thoughts:  No  Memory:  Negative  Judgement:  Fair  Insight:  NA and Good  Psychomotor Activity:  Normal  Concentration:    Recall:  Good  Fund of Knowledge:  Language: Good  Akathisia:  No  Handed:  Right  AIMS (if indicated):    Assets:  Desire for Improvement  ADL's:  Intact  Cognition: WNL  Sleep:      Treatment Plan Summary:  This patient's first problem is that of major depression.  The patient is in remission taking Effexor 300 mg and desipramine 25 mg.  She has no vegetative symptoms of significance.  She is not in psychotherapy at this time.  Her second problem is borderline personality disorder.  This was diagnosed years ago and treated successfully in [place] with DBT.  Patient's mood is stable and relationships seem to be stable as well.  The patient will be seen again in 6 months.  At that time she will be seen in person. Jerral Ralph, MD 7/16/20219:47 AM

## 2020-02-23 ENCOUNTER — Telehealth: Payer: Self-pay

## 2020-02-23 ENCOUNTER — Other Ambulatory Visit: Payer: Self-pay | Admitting: Nurse Practitioner

## 2020-02-23 DIAGNOSIS — N390 Urinary tract infection, site not specified: Secondary | ICD-10-CM

## 2020-02-23 MED ORDER — CEPHALEXIN 500 MG PO CAPS: 500.0000 mg | ORAL_CAPSULE | Freq: Three times a day (TID) | ORAL | 0 refills | Status: DC

## 2020-02-23 NOTE — Telephone Encounter (Signed)
Pt.notified

## 2020-02-23 NOTE — Telephone Encounter (Signed)
Sent cephalexin 500mg  three times daily for ten days to [facility]. Per culture results from last visit, this should be effective antibiotic.

## 2020-02-23 NOTE — Progress Notes (Signed)
Sent cephalexin 500mg  three times daily for ten days to [facility]. Per culture results from last visit, this should be effective antibiotic.

## 2020-02-24 ENCOUNTER — Telehealth: Payer: Self-pay

## 2020-02-24 NOTE — Telephone Encounter (Signed)
Confirmed and screened for 02-24-20 ov. °

## 2020-02-26 ENCOUNTER — Ambulatory Visit: Payer: Medicare HMO | Admitting: Nurse Practitioner

## 2020-03-01 ENCOUNTER — Ambulatory Visit (INDEPENDENT_AMBULATORY_CARE_PROVIDER_SITE_OTHER): Payer: Medicare HMO | Admitting: Physical Medicine and Rehabilitation

## 2020-03-01 ENCOUNTER — Other Ambulatory Visit: Payer: Self-pay

## 2020-03-01 ENCOUNTER — Encounter: Payer: Self-pay | Admitting: Physical Medicine and Rehabilitation

## 2020-03-01 ENCOUNTER — Ambulatory Visit: Payer: Self-pay

## 2020-03-01 DIAGNOSIS — M7062 Trochanteric bursitis, left hip: Secondary | ICD-10-CM | POA: Diagnosis not present

## 2020-03-01 NOTE — Progress Notes (Signed)
Pt states left hip pain. Pt states shuffle her weight and turn makes the pain worse. Pt states resting and heat/cold pads and meds helps.   Numeric Pain Rating Scale and Functional Assessment Average Pain 4    In the last MONTH (on 0-10 scale) has pain interfered with the following?  1. General activity like being  able to carry out your everyday physical activities such as walking, climbing stairs, carrying groceries, or moving a chair?  Rating(7)   +Driver, -BT, -Dye Allergies.

## 2020-03-02 MED ORDER — BUPIVACAINE HCL 0.25 % IJ SOLN
4.0000 mL | INTRAMUSCULAR | Status: AC | PRN
  Administered 2020-03-01: 4 mL via INTRA_ARTICULAR

## 2020-03-02 MED ORDER — TRIAMCINOLONE ACETONIDE 40 MG/ML IJ SUSP
60.0000 mg | INTRAMUSCULAR | Status: AC | PRN
  Administered 2020-03-01: 60 mg via INTRA_ARTICULAR

## 2020-03-02 MED ORDER — LIDOCAINE HCL 2 % IJ SOLN
4.0000 mL | INTRAMUSCULAR | Status: AC | PRN
  Administered 2020-03-01: 4 mL

## 2020-03-02 NOTE — Progress Notes (Signed)
° °  Jamie Bennett - 59 y.o. female MRN [id number]  Date of birth: Mar 07, 1961  Office Visit Note: Visit Date: 03/01/2020 PCP: Ronnell Freshwater, NP Referred by: Ronnell Freshwater, NP  Subjective: Chief Complaint  Patient presents with   Left Hip - Pain   HPI:  Jamie Bennett is a 59 y.o. female who comes in today at the request of Dr. Bo Merino for planned Left greater trochanteric injection with fluoroscopic guidance.  The patient has failed conservative care including home exercise, medications, time and activity modification.  This injection will be diagnostic and hopefully therapeutic.  Please see requesting physician notes for further details and justification.  Fluoroscopic guidance request to do to body habitus.   ROS Otherwise per HPI.  Assessment & Plan: Visit Diagnoses:  1. Greater trochanteric bursitis, left     Plan: No additional findings.   Meds & Orders: No orders of the defined types were placed in this encounter.   Orders Placed This Encounter  Procedures   Large Joint Inj   XR C-ARM NO REPORT    Follow-up: Return for visit to requesting physician as needed.   Procedures: Large Joint Inj: L greater trochanter (Left) on 03/01/2020 2:21 PM Indications: pain and diagnostic evaluation Details: 22 G 3.5 in needle, fluoroscopy-guided lateral approach  Arthrogram: No  Medications: 4 mL lidocaine 2 %; 4 mL bupivacaine 0.25 %; 60 mg triamcinolone acetonide 40 MG/ML Outcome: tolerated well, no immediate complications  There was excellent flow of contrast outlined the greater trochanteric bursa without vascular uptake. Procedure, treatment alternatives, risks and benefits explained, specific risks discussed. Consent was given by the patient. Immediately prior to procedure a time out was called to verify the correct patient, procedure, equipment, support staff and site/side marked as required. Patient was prepped and draped in the usual sterile  fashion.      No notes on file   Clinical History: No specialty comments available.     Objective:  VS:  HT:     WT:    BMI:      BP:    HR: bpm   TEMP: ( )   RESP:  Physical Exam   Imaging: XR C-ARM NO REPORT  Result Date: 03/01/2020 Please see Notes tab for imaging impression.

## 2020-03-05 ENCOUNTER — Telehealth: Payer: Self-pay

## 2020-03-05 NOTE — Telephone Encounter (Signed)
Confirmed and screened for 03-09-20 ov. 

## 2020-03-09 ENCOUNTER — Other Ambulatory Visit: Payer: Self-pay

## 2020-03-09 ENCOUNTER — Ambulatory Visit (INDEPENDENT_AMBULATORY_CARE_PROVIDER_SITE_OTHER): Payer: Medicare HMO | Admitting: Nurse Practitioner

## 2020-03-09 ENCOUNTER — Encounter: Payer: Self-pay | Admitting: Nurse Practitioner

## 2020-03-09 VITALS — BP 132/75 | HR 75 | Temp 97.6°F | Resp 16 | Ht 65.0 in | Wt 340.6 lb

## 2020-03-09 DIAGNOSIS — M542 Cervicalgia: Secondary | ICD-10-CM

## 2020-03-09 DIAGNOSIS — E039 Hypothyroidism, unspecified: Secondary | ICD-10-CM

## 2020-03-09 DIAGNOSIS — J45990 Exercise induced bronchospasm: Secondary | ICD-10-CM | POA: Diagnosis not present

## 2020-03-09 DIAGNOSIS — F3162 Bipolar disorder, current episode mixed, moderate: Secondary | ICD-10-CM | POA: Diagnosis not present

## 2020-03-09 DIAGNOSIS — R69 Illness, unspecified: Secondary | ICD-10-CM | POA: Diagnosis not present

## 2020-03-09 NOTE — Progress Notes (Signed)
[facility], [place], [place] [address]  Internal MEDICINE  Office Visit Note  Patient Name: Jamie Bennett  [id number]  [id number]  Date of Service: 03/24/2020  Chief Complaint  Patient presents with   Follow-up   Gastroesophageal Reflux   Quality Metric Gaps    TDAP, Influenza Vaccine   Headache    Having headaches/neck pain; believes it could be tension headaches    The patient is here for routine follow up. She states that she is having head and neck pain. This is likely related to tension and increased stress. She has been helping with her mother-in-law, who has recently become ill with a-fib. The patient has been running back and forth to [place] where her mother-in-law is in assisted living now.  The patient does continue to see her rheumatologist. She also sees hematology due to iron deficiency anemia. She does need to have check of lipid and thyroid panels.       Current Medication: Outpatient Encounter Medications as of 03/09/2020  Medication Sig   albuterol (VENTOLIN HFA) 108 (90 Base) MCG/ACT inhaler Inhale 2 puffs into the lungs every 6 (six) hours as needed for wheezing or shortness of breath.   aspirin EC 81 MG tablet Take 81 mg by mouth daily.   busPIRone (BUSPAR) 30 MG tablet 1 bid   cephALEXin (KEFLEX) 500 MG capsule Take 1 capsule (500 mg total) by mouth 3 (three) times daily.   Cholecalciferol (HM VITAMIN D3) 4000 units CAPS Take 4,000 Units by mouth daily.    clonazePAM (KLONOPIN) 0.5 MG tablet Take one tablet by mouth for acute anxiety, EMERGENCIES only - do not exceed 1 tab daily   Coenzyme Q-10 200 MG CAPS Take 200 mg by mouth 2 (two) times daily.    cyanocobalamin (,VITAMIN B-12,) 1000 MCG/ML injection Inject 1,000 mcg into the muscle every 3 (three) months.    denosumab (PROLIA) 60 MG/ML SOSY injection Inject 60 mg into the skin every 6 (six) months.   desipramine (NORPRAMIN) 25 MG tablet 1  qam    esomeprazole (NEXIUM) 20 MG capsule Take 20 mg by mouth daily.    Fiber, Guar Gum, CHEW Chew 5 mg by mouth daily.    hydroxychloroquine (PLAQUENIL) 200 MG tablet TAKE ONE TABLET BY MOUTH TWICE A DAY MONDAY-FRIDAY FOR RHEUMATOID ARTHRITIS   hydrOXYzine (ATARAX/VISTARIL) 50 MG tablet Take 1 tablet (50 mg total) by mouth 3 (three) times daily as needed for anxiety.   ibuprofen (ADVIL,MOTRIN) 100 MG tablet Take 100 mg by mouth as needed for fever.   Krill Oil 500 MG CAPS Take 500 mg by mouth daily.   lamoTRIgine (LAMICTAL) 150 MG tablet Take 1 tablet (150 mg total) by mouth 2 (two) times daily.   levofloxacin (LEVAQUIN) 500 MG tablet Take 1 tablet (500 mg total) by mouth daily.   levothyroxine (SYNTHROID) 112 MCG tablet Take 1 tablet (112 mcg total) by mouth daily.   loratadine (CLARITIN) 10 MG tablet Take 10 mg by mouth daily as needed for allergies.   polyethylene glycol (MIRALAX) packet Take 17 g by mouth daily.   sucralfate (CARAFATE) 1 g tablet Take 1 tablet (1 g total) by mouth 2 (two) times daily.   traZODone (DESYREL) 100 MG tablet Take 1 tablet (100 mg total) by mouth at bedtime as needed and may repeat dose one time if needed for sleep.   venlafaxine (EFFEXOR) 100 MG tablet Take 1 tablet (100 mg total) by mouth 3 (three) times daily with  meals.   [DISCONTINUED] tiZANidine (ZANAFLEX) 4 MG tablet Take 1 tablet (4 mg total) by mouth 3 (three) times daily.   No facility-administered encounter medications on file as of 03/09/2020.    Surgical History: Past Surgical History:  Procedure Laterality Date   CHOLECYSTECTOMY  2004   COLONOSCOPY N/A 07/16/2017   Procedure: COLONOSCOPY;  Surgeon: Manya Silvas, MD;  Location: [facility] ENDOSCOPY;  Service: Endoscopy;  Laterality: N/A;   EXPLORATORY LAPAROTOMY  2009   GASTRIC BYPASS  2003   [place] N/A 03/01/2018   Procedure: EXPLORATORY LAPAROTOMY/ UMBILECTOMY;  Surgeon: Robert Bellow, MD;  Location: [facility]  ORS;  Service: General;  Laterality: N/A;   TONSILLECTOMY     VENTRAL HERNIA REPAIR N/A 03/07/2018   Procedure: HERNIA REPAIR VENTRAL ADULT;  Surgeon: Robert Bellow, MD;  Location: [facility] ORS;  Service: General;  Laterality: N/A;    Medical History: Past Medical History:  Diagnosis Date   Anemia    Anxiety    Bipolar 1 disorder ([place])    Collagen vascular disease ([place])    rhematoid arthritis   Constipation    Fibromyalgia    GERD (gastroesophageal reflux disease)    Heart murmur    mild-asymptomatic   Hepatic steatosis    History of Roux-en-Y gastric bypass    Hypotension    Hypothyroidism    Opioid abuse (HCC)    Osteoarthritis    Osteoporosis    PONV (postoperative nausea and vomiting)    nausea only   Rheumatic fever    Rheumatoid arthritis ([place])    Thyroid disease     Family History: Family History  Problem Relation Age of Onset   Depression Mother    Dementia Mother    Heart disease Mother    Osteoporosis Mother    Parkinson's disease Father    Colon cancer Neg Hx     Social History   Socioeconomic History   Marital status: Married    Spouse name: Not on file   Number of children: Not on file   Years of education: Not on file   Highest education level: Not on file  Occupational History   Not on file  Tobacco Use   Smoking status: Never Smoker   Smokeless tobacco: Never Used  Vaping Use   Vaping Use: Never used  Substance and Sexual Activity   Alcohol use: No    Alcohol/week: 0.0 standard drinks   Drug use: No   Sexual activity: Yes  Other Topics Concern   Not on file  Social History Narrative   Not on file   Social Determinants of Health   Financial Resource Strain:    Difficulty of Paying Living Expenses: Not on file  Food Insecurity:    Worried About Running Out of Food in the Last Year: Not on file   [employer] of Food in the Last Year: Not on file  Transportation Needs:    Lack of  Transportation (Medical): Not on file   Lack of Transportation (Non-Medical): Not on file  Physical Activity:    Days of Exercise per Week: Not on file   Minutes of Exercise per Session: Not on file  Stress:    Feeling of Stress : Not on file  Social Connections:    Frequency of Communication with Friends and Family: Not on file   Frequency of Social Gatherings with Friends and Family: Not on file   Attends Religious Services: Not on file   Active Member  of Clubs or Organizations: Not on file   Attends Archivist Meetings: Not on file   Marital Status: Not on file  Intimate Partner Violence:    Fear of Current or Ex-Partner: Not on file   Emotionally Abused: Not on file   Physically Abused: Not on file   Sexually Abused: Not on file      Review of Systems  Constitutional: Positive for fatigue. Negative for activity change, chills and unexpected weight change.  HENT: Negative for congestion, postnasal drip, rhinorrhea, sneezing and sore throat.   Respiratory: Negative for cough, chest tightness and shortness of breath.   Cardiovascular: Negative for chest pain and palpitations.  Gastrointestinal: Negative for abdominal pain, constipation, diarrhea, nausea and vomiting.  Endocrine: Negative for cold intolerance, heat intolerance, polydipsia and polyuria.  Musculoskeletal: Positive for back pain, myalgias and neck pain. Negative for arthralgias and joint swelling.       Increased tension and spasms in muscles, especially of neck and upper back.   Skin: Negative for rash.  Neurological: Positive for headaches. Negative for dizziness, tremors and numbness.  Hematological: Negative for adenopathy. Does not bruise/bleed easily.  Psychiatric/Behavioral: Positive for dysphoric mood. Negative for behavioral problems (Depression), sleep disturbance and suicidal ideas. The patient is nervous/anxious.        Currently sees psychiatric provider.    Today's Vitals    03/09/20 1449  BP: 132/75  Pulse: 75  Resp: 16  Temp: 97.6 F (36.4 C)  SpO2: 98%  Weight: (!) 340 lb 9.6 oz (154.5 kg)  Height: 5\' 5"  (1.651 m)   Body mass index is 56.68 kg/m.  Physical Exam Vitals and nursing note reviewed.  Constitutional:      General: She is not in acute distress.    Appearance: Normal appearance. She is well-developed. She is obese. She is not diaphoretic.  HENT:     Head: Normocephalic and atraumatic.     Mouth/Throat:     Pharynx: No oropharyngeal exudate.  Eyes:     Pupils: Pupils are equal, round, and reactive to light.  Neck:     Thyroid: No thyromegaly.     Vascular: No JVD.     Trachea: No tracheal deviation.  Cardiovascular:     Rate and Rhythm: Normal rate and regular rhythm.     Heart sounds: Normal heart sounds. No murmur heard.  No friction rub. No gallop.   Pulmonary:     Effort: Pulmonary effort is normal. No respiratory distress.     Breath sounds: Normal breath sounds. No wheezing or rales.  Chest:     Chest wall: No tenderness.  Abdominal:     General: Bowel sounds are normal.     Palpations: Abdomen is soft.     Tenderness: There is no abdominal tenderness.  Genitourinary:    Comments: Urine sample showing moderate WBC. It is positive for protein and nitrites.  Musculoskeletal:        General: Normal range of motion.     Cervical back: Normal range of motion and neck supple. Pain with movement, spinous process tenderness and muscular tenderness present.  Lymphadenopathy:     Cervical: No cervical adenopathy.  Skin:    General: Skin is warm and dry.  Neurological:     Mental Status: She is alert and oriented to person, place, and time.     Cranial Nerves: No cranial nerve deficit.  Psychiatric:        Attention and Perception: Attention and perception normal.  Mood and Affect: Affect normal. Mood is depressed.        Speech: Speech normal.        Behavior: Behavior normal. Behavior is cooperative.        Thought  Content: Thought content normal.        Cognition and Memory: Cognition and memory normal.        Judgment: Judgment normal.    Assessment/Plan: 1. Acquired hypothyroidism Check thyroid panel and adjust levothyroxine dose as indicated   2. Bipolar disorder, current episode mixed, moderate ([place]) Patient should continue regular visits with psychiatry as scheduled   3. Neck pain, musculoskeletal Likely due to increased stress and tension. May take tizanidine as needed and as previously prescribed.   4. Exercise-induced asthma Use rescue inhaler as needed and as prescribed   General Counseling: Shayanne verbalizes understanding of the findings of todays visit and agrees with plan of treatment. I have discussed any further diagnostic evaluation that may be needed or ordered today. We also reviewed her medications today. she has been encouraged to call the office with any questions or concerns that should arise related to todays visit.   This patient was seen by Leretha Pol FNP Collaboration with Dr Lavera Guise as a part of collaborative care agreement   Total time spent: 25 Minutes   Time spent includes review of chart, medications, test results, and follow up plan with the patient.      Dr Lavera Guise Internal medicine

## 2020-03-11 ENCOUNTER — Other Ambulatory Visit: Payer: Self-pay

## 2020-03-11 MED ORDER — CIPROFLOXACIN HCL 500 MG PO TABS: 500.0000 mg | ORAL_TABLET | Freq: Two times a day (BID) | ORAL | 0 refills | Status: DC

## 2020-03-11 NOTE — Telephone Encounter (Signed)
Pt called that she had UTI,burning,lower back as per heather send cipro 500 mg 1 tab po bid for 10 days

## 2020-03-23 ENCOUNTER — Inpatient Hospital Stay: Payer: Medicare HMO | Attending: Oncology

## 2020-03-23 ENCOUNTER — Inpatient Hospital Stay: Payer: Medicare HMO

## 2020-03-23 ENCOUNTER — Other Ambulatory Visit: Payer: Self-pay

## 2020-03-23 DIAGNOSIS — E538 Deficiency of other specified B group vitamins: Secondary | ICD-10-CM | POA: Insufficient documentation

## 2020-03-23 DIAGNOSIS — D509 Iron deficiency anemia, unspecified: Secondary | ICD-10-CM | POA: Insufficient documentation

## 2020-03-23 DIAGNOSIS — Z79899 Other long term (current) drug therapy: Secondary | ICD-10-CM | POA: Insufficient documentation

## 2020-03-23 DIAGNOSIS — Z9884 Bariatric surgery status: Secondary | ICD-10-CM

## 2020-03-23 LAB — CBC WITH DIFFERENTIAL/PLATELET
Abs Immature Granulocytes: 0.01 K/uL (ref 0.00–0.07)
Basophils Absolute: 0 K/uL (ref 0.0–0.1)
Basophils Relative: 1 %
Eosinophils Absolute: 0.3 K/uL (ref 0.0–0.5)
Eosinophils Relative: 5 %
HCT: 35.3 % — ABNORMAL LOW (ref 36.0–46.0)
Hemoglobin: 11.8 g/dL — ABNORMAL LOW (ref 12.0–15.0)
Immature Granulocytes: 0 %
Lymphocytes Relative: 27 %
Lymphs Abs: 1.5 K/uL (ref 0.7–4.0)
MCH: 30.8 pg (ref 26.0–34.0)
MCHC: 33.4 g/dL (ref 30.0–36.0)
MCV: 92.2 fL (ref 80.0–100.0)
Monocytes Absolute: 0.5 K/uL (ref 0.1–1.0)
Monocytes Relative: 9 %
Neutro Abs: 3.2 K/uL (ref 1.7–7.7)
Neutrophils Relative %: 58 %
Platelets: 224 K/uL (ref 150–400)
RBC: 3.83 MIL/uL — ABNORMAL LOW (ref 3.87–5.11)
RDW: 13.2 % (ref 11.5–15.5)
WBC: 5.5 K/uL (ref 4.0–10.5)
nRBC: 0 % (ref 0.0–0.2)

## 2020-03-23 LAB — IRON AND TIBC
Iron: 90 ug/dL (ref 28–170)
Saturation Ratios: 20 % (ref 10.4–31.8)
TIBC: 459 ug/dL — ABNORMAL HIGH (ref 250–450)
UIBC: 369 ug/dL

## 2020-03-23 LAB — FERRITIN: Ferritin: 87 ng/mL (ref 11–307)

## 2020-03-23 MED ORDER — CYANOCOBALAMIN 1000 MCG/ML IJ SOLN
1000.0000 ug | Freq: Once | INTRAMUSCULAR | Status: AC
  Administered 2020-03-23: 1000 ug via INTRAMUSCULAR
  Filled 2020-03-23: qty 1

## 2020-03-24 ENCOUNTER — Other Ambulatory Visit: Payer: Self-pay

## 2020-03-24 DIAGNOSIS — M542 Cervicalgia: Secondary | ICD-10-CM | POA: Insufficient documentation

## 2020-03-24 MED ORDER — TIZANIDINE HCL 4 MG PO TABS
4.0000 mg | ORAL_TABLET | Freq: Three times a day (TID) | ORAL | 1 refills | Status: DC
Start: 2020-03-24 — End: 2020-03-24

## 2020-03-24 MED ORDER — TIZANIDINE HCL 4 MG PO TABS
4.0000 mg | ORAL_TABLET | Freq: Three times a day (TID) | ORAL | 1 refills | Status: DC
Start: 2020-03-24 — End: 2020-08-02

## 2020-03-29 ENCOUNTER — Other Ambulatory Visit: Payer: Self-pay | Admitting: Rheumatology

## 2020-03-29 DIAGNOSIS — Z79899 Other long term (current) drug therapy: Secondary | ICD-10-CM

## 2020-03-29 DIAGNOSIS — M0579 Rheumatoid arthritis with rheumatoid factor of multiple sites without organ or systems involvement: Secondary | ICD-10-CM

## 2020-03-29 MED ORDER — HYDROXYCHLOROQUINE SULFATE 200 MG PO TABS: ORAL_TABLET | ORAL | 0 refills | Status: DC

## 2020-03-29 NOTE — Telephone Encounter (Signed)
Last Visit: 01/27/2020 Next Visit: 06/29/2020 Labs: 03/23/2020 CBC: RBC 3.83, Hgb 11.8, Hct 35.3, CMP 10/03/2019 Chloride 108, Alk Phos. 132 Eye exam: 07/22/2019 WNL  Current Dose per office note 01/27/2020: Plaquenil 200 mg 1 tablet twice daily Monday-Friday only DX: Rheumatoid arthritis involving multiple sites with positive rheumatoid factor   Patient advised she is due to update a CMP. Patient states she will update tomorrow. Patient is in need on the PLQ and ask if we can go ahead and send the prescription.   Okay to refill Plaquenil?

## 2020-03-29 NOTE — Telephone Encounter (Signed)
Patient left a voicemail requesting prescription refill of Plaquenil to be sent to [facility].

## 2020-03-30 DIAGNOSIS — Z79899 Other long term (current) drug therapy: Secondary | ICD-10-CM | POA: Diagnosis not present

## 2020-04-01 LAB — CMP14+EGFR
ALT: 15 IU/L (ref 0–32)
AST: 15 IU/L (ref 0–40)
Albumin/Globulin Ratio: 1.8 (ref 1.2–2.2)
Albumin: 4.4 g/dL (ref 3.8–4.9)
Alkaline Phosphatase: 104 IU/L (ref 48–121)
BUN/Creatinine Ratio: 25 — ABNORMAL HIGH (ref 9–23)
BUN: 20 mg/dL (ref 6–24)
Bilirubin Total: 0.3 mg/dL (ref 0.0–1.2)
CO2: 24 mmol/L (ref 20–29)
Calcium: 9.7 mg/dL (ref 8.7–10.2)
Chloride: 105 mmol/L (ref 96–106)
Creatinine, Ser: 0.81 mg/dL (ref 0.57–1.00)
GFR calc Af Amer: 92 mL/min/{1.73_m2} (ref >59)
GFR calc non Af Amer: 80 mL/min/{1.73_m2} (ref >59)
Globulin, Total: 2.4 g/dL (ref 1.5–4.5)
Glucose: 77 mg/dL (ref 65–99)
Potassium: 5.1 mmol/L (ref 3.5–5.2)
Sodium: 142 mmol/L (ref 134–144)
Total Protein: 6.8 g/dL (ref 6.0–8.5)

## 2020-04-10 ENCOUNTER — Other Ambulatory Visit (HOSPITAL_COMMUNITY): Payer: Self-pay | Admitting: Psychiatry

## 2020-04-10 DIAGNOSIS — F411 Generalized anxiety disorder: Secondary | ICD-10-CM

## 2020-04-12 ENCOUNTER — Other Ambulatory Visit (HOSPITAL_COMMUNITY): Payer: Self-pay | Admitting: *Deleted

## 2020-04-12 DIAGNOSIS — F411 Generalized anxiety disorder: Secondary | ICD-10-CM

## 2020-04-12 MED ORDER — CLONAZEPAM 0.5 MG PO TABS: ORAL_TABLET | ORAL | 0 refills | Status: DC

## 2020-05-02 ENCOUNTER — Other Ambulatory Visit (HOSPITAL_COMMUNITY): Payer: Self-pay | Admitting: Psychiatry

## 2020-05-02 DIAGNOSIS — F411 Generalized anxiety disorder: Secondary | ICD-10-CM

## 2020-05-05 ENCOUNTER — Other Ambulatory Visit (HOSPITAL_COMMUNITY): Payer: Self-pay | Admitting: *Deleted

## 2020-05-05 DIAGNOSIS — F411 Generalized anxiety disorder: Secondary | ICD-10-CM

## 2020-05-05 MED ORDER — BUSPIRONE HCL 30 MG PO TABS: ORAL_TABLET | ORAL | 1 refills | Status: DC

## 2020-05-10 ENCOUNTER — Other Ambulatory Visit: Payer: Self-pay | Admitting: Nurse Practitioner

## 2020-05-10 ENCOUNTER — Telehealth: Payer: Self-pay

## 2020-05-10 DIAGNOSIS — N390 Urinary tract infection, site not specified: Secondary | ICD-10-CM

## 2020-05-10 MED ORDER — CIPROFLOXACIN HCL 500 MG PO TABS: 500.0000 mg | ORAL_TABLET | Freq: Two times a day (BID) | ORAL | 0 refills | Status: DC

## 2020-05-10 NOTE — Telephone Encounter (Signed)
That was my fault.  I have Sent new prescription for cipro twice daily to [facility]. Please remind her that she should not take tizanidine at all while she is taking cipro. Thanks.

## 2020-05-10 NOTE — Progress Notes (Signed)
Sent new prescription for cipro twice daily to [facility].

## 2020-05-10 NOTE — Telephone Encounter (Signed)
thanks

## 2020-05-17 ENCOUNTER — Other Ambulatory Visit (HOSPITAL_COMMUNITY): Payer: Self-pay | Admitting: *Deleted

## 2020-05-17 DIAGNOSIS — F411 Generalized anxiety disorder: Secondary | ICD-10-CM

## 2020-05-17 MED ORDER — CLONAZEPAM 0.5 MG PO TABS: ORAL_TABLET | ORAL | 0 refills | Status: DC

## 2020-05-26 ENCOUNTER — Other Ambulatory Visit: Payer: Self-pay | Admitting: Nurse Practitioner

## 2020-05-26 DIAGNOSIS — E039 Hypothyroidism, unspecified: Secondary | ICD-10-CM | POA: Diagnosis not present

## 2020-05-26 DIAGNOSIS — Z0001 Encounter for general adult medical examination with abnormal findings: Secondary | ICD-10-CM | POA: Diagnosis not present

## 2020-05-26 DIAGNOSIS — E559 Vitamin D deficiency, unspecified: Secondary | ICD-10-CM | POA: Diagnosis not present

## 2020-05-27 ENCOUNTER — Other Ambulatory Visit (HOSPITAL_COMMUNITY): Payer: Self-pay | Admitting: Psychiatry

## 2020-05-27 DIAGNOSIS — F603 Borderline personality disorder: Secondary | ICD-10-CM

## 2020-05-27 DIAGNOSIS — F332 Major depressive disorder, recurrent severe without psychotic features: Secondary | ICD-10-CM

## 2020-05-27 LAB — COMPREHENSIVE METABOLIC PANEL
ALT: 19 IU/L (ref 0–32)
AST: 21 IU/L (ref 0–40)
Albumin/Globulin Ratio: 2 (ref 1.2–2.2)
Albumin: 4.1 g/dL (ref 3.8–4.9)
Alkaline Phosphatase: 115 IU/L (ref 44–121)
BUN/Creatinine Ratio: 18 (ref 12–28)
BUN: 14 mg/dL (ref 8–27)
Bilirubin Total: 0.3 mg/dL (ref 0.0–1.2)
CO2: 21 mmol/L (ref 20–29)
Calcium: 9.2 mg/dL (ref 8.7–10.3)
Chloride: 105 mmol/L (ref 96–106)
Creatinine, Ser: 0.76 mg/dL (ref 0.57–1.00)
GFR calc Af Amer: 99 mL/min/{1.73_m2} (ref >59)
GFR calc non Af Amer: 86 mL/min/{1.73_m2} (ref >59)
Globulin, Total: 2.1 g/dL (ref 1.5–4.5)
Glucose: 84 mg/dL (ref 65–99)
Potassium: 3.8 mmol/L (ref 3.5–5.2)
Sodium: 143 mmol/L (ref 134–144)
Total Protein: 6.2 g/dL (ref 6.0–8.5)

## 2020-05-27 LAB — LIPID PANEL WITH LDL/HDL RATIO
Cholesterol, Total: 169 mg/dL (ref 100–199)
HDL: 73 mg/dL (ref >39)
LDL Chol Calc (NIH): 73 mg/dL (ref 0–99)
LDL/HDL Ratio: 1 ratio (ref 0.0–3.2)
Triglycerides: 136 mg/dL (ref 0–149)
VLDL Cholesterol Cal: 23 mg/dL (ref 5–40)

## 2020-05-27 LAB — CBC
Hematocrit: 35.6 % (ref 34.0–46.6)
Hemoglobin: 11.9 g/dL (ref 11.1–15.9)
MCH: 30.3 pg (ref 26.6–33.0)
MCHC: 33.4 g/dL (ref 31.5–35.7)
MCV: 91 fL (ref 79–97)
Platelets: 242 10*3/uL (ref 150–450)
RBC: 3.93 x10E6/uL (ref 3.77–5.28)
RDW: 12.3 % (ref 11.7–15.4)
WBC: 4.5 10*3/uL (ref 3.4–10.8)

## 2020-05-27 LAB — VITAMIN D 25 HYDROXY (VIT D DEFICIENCY, FRACTURES): Vit D, 25-Hydroxy: 55.9 ng/mL (ref 30.0–100.0)

## 2020-05-27 LAB — TSH: TSH: 1.55 u[IU]/mL (ref 0.450–4.500)

## 2020-05-27 LAB — T4, FREE: Free T4: 1.24 ng/dL (ref 0.82–1.77)

## 2020-05-30 ENCOUNTER — Other Ambulatory Visit (HOSPITAL_COMMUNITY): Payer: Self-pay | Admitting: Psychiatry

## 2020-05-30 DIAGNOSIS — F332 Major depressive disorder, recurrent severe without psychotic features: Secondary | ICD-10-CM

## 2020-05-30 DIAGNOSIS — F603 Borderline personality disorder: Secondary | ICD-10-CM

## 2020-05-31 NOTE — Progress Notes (Signed)
Reviewed. Discuss at visit 11/2

## 2020-06-01 ENCOUNTER — Ambulatory Visit (INDEPENDENT_AMBULATORY_CARE_PROVIDER_SITE_OTHER): Payer: Medicare HMO | Admitting: Nurse Practitioner

## 2020-06-01 ENCOUNTER — Encounter: Payer: Self-pay | Admitting: Nurse Practitioner

## 2020-06-01 ENCOUNTER — Other Ambulatory Visit: Payer: Self-pay

## 2020-06-01 ENCOUNTER — Ambulatory Visit
Admission: RE | Admit: 2020-06-01 | Discharge: 2020-06-01 | Disposition: A | Discharge status: Home / Self Care | Payer: Medicare HMO | Source: Ambulatory Visit | Attending: Nurse Practitioner | Admitting: Nurse Practitioner

## 2020-06-01 ENCOUNTER — Ambulatory Visit
Admission: RE | Admit: 2020-06-01 | Discharge: 2020-06-01 | Disposition: A | Discharge status: Home / Self Care | Payer: Medicare HMO | Attending: Nurse Practitioner | Admitting: Nurse Practitioner

## 2020-06-01 VITALS — BP 136/85 | HR 81 | Temp 97.5°F | Resp 16 | Ht 65.0 in | Wt 353.0 lb

## 2020-06-01 DIAGNOSIS — Z0001 Encounter for general adult medical examination with abnormal findings: Secondary | ICD-10-CM

## 2020-06-01 DIAGNOSIS — Z1231 Encounter for screening mammogram for malignant neoplasm of breast: Secondary | ICD-10-CM | POA: Diagnosis not present

## 2020-06-01 DIAGNOSIS — M19071 Primary osteoarthritis, right ankle and foot: Secondary | ICD-10-CM | POA: Diagnosis not present

## 2020-06-01 DIAGNOSIS — M79671 Pain in right foot: Secondary | ICD-10-CM

## 2020-06-01 DIAGNOSIS — H9313 Tinnitus, bilateral: Secondary | ICD-10-CM | POA: Diagnosis not present

## 2020-06-01 DIAGNOSIS — F3162 Bipolar disorder, current episode mixed, moderate: Secondary | ICD-10-CM

## 2020-06-01 DIAGNOSIS — R69 Illness, unspecified: Secondary | ICD-10-CM | POA: Diagnosis not present

## 2020-06-01 DIAGNOSIS — E039 Hypothyroidism, unspecified: Secondary | ICD-10-CM | POA: Diagnosis not present

## 2020-06-01 NOTE — Progress Notes (Signed)
[facility] [place],  [address]  Internal MEDICINE  Office Visit Note  Patient Name: Jamie Bennett  [id number]  [id number]  Date of Service: 06/25/2020   Pt is here for routine health maintenance examination  Chief Complaint  Patient presents with  . Medicare Wellness  . Gastroesophageal Reflux  . Quality Metric Gaps    flu,tetnaus  . controlled substance form    reviewed with PT  . Foot Pain    pain in left heel  . Ear Pain    ringing in both ears     The patient is here for health maintenance exam. She states that she has had some increased anxiety and depression due to recent family stress. She does see psychiatry/counselor for management.  Routine, fasting labs done prior to this visit. Results normal.  Has osteoporosis. Does see endocrinologist at [facility]. Gets prolia injections. Bone density tests done in that office. Due for screening mammogram at end of December, 2021 Tinnitus present in both ears. Nearly constant. Describes as "static" like noise. Can hear over it. States that she did have a lot of ear infections as a child.  Right heel pain. Present for about 2 weeks. Hurts mostly along the outer aspect of the right ankle and heel. Hurts more when she has been standing on if for a while. Hurts more when bending the foot from side to side. Ha had heel spur in the past. States that she has tried different shoes, but this just seems to make the pain worse.     Current Medication: Outpatient Encounter Medications as of 06/01/2020  Medication Sig  . albuterol (VENTOLIN HFA) 108 (90 Base) MCG/ACT inhaler Inhale 2 puffs into the lungs every 6 (six) hours as needed for wheezing or shortness of breath.  Marland Kitchen aspirin EC 81 MG tablet Take 81 mg by mouth daily.  . busPIRone (BUSPAR) 30 MG tablet 1 bid  . Cholecalciferol (HM VITAMIN D3) 4000 units CAPS Take 4,000 Units by mouth daily.   . ciprofloxacin (CIPRO) 500 MG tablet Take 1  tablet (500 mg total) by mouth 2 (two) times daily.  . Coenzyme Q-10 200 MG CAPS Take 200 mg by mouth 2 (two) times daily.   . cyanocobalamin (,VITAMIN B-12,) 1000 MCG/ML injection Inject 1,000 mcg into the muscle every 3 (three) months.   Marland Kitchen denosumab (PROLIA) 60 MG/ML SOSY injection Inject 60 mg into the skin every 6 (six) months.  . desipramine (NORPRAMIN) 25 MG tablet 1  qam  . esomeprazole (NEXIUM) 20 MG capsule Take 20 mg by mouth daily.   . Fiber, Guar Gum, CHEW Chew 5 mg by mouth daily.   . hydrOXYzine (ATARAX/VISTARIL) 50 MG tablet Take 1 tablet (50 mg total) by mouth 3 (three) times daily as needed for anxiety.  Marland Kitchen ibuprofen (ADVIL,MOTRIN) 100 MG tablet Take 100 mg by mouth as needed for fever.  Javier Docker Oil 500 MG CAPS Take 500 mg by mouth daily.  Marland Kitchen lamoTRIgine (LAMICTAL) 150 MG tablet Take 1 tablet (150 mg total) by mouth 2 (two) times daily.  Marland Kitchen levothyroxine (SYNTHROID) 112 MCG tablet Take 1 tablet (112 mcg total) by mouth daily.  Marland Kitchen loratadine (CLARITIN) 10 MG tablet Take 10 mg by mouth daily as needed for allergies.  . polyethylene glycol (MIRALAX) packet Take 17 g by mouth daily.  . sucralfate (CARAFATE) 1 g tablet Take 1 tablet (1 g total) by mouth 2 (two) times daily.  Marland Kitchen tiZANidine (ZANAFLEX) 4 MG  tablet Take 1 tablet (4 mg total) by mouth 3 (three) times daily.  . traZODone (DESYREL) 100 MG tablet Take 1 tablet (100 mg total) by mouth at bedtime as needed and may repeat dose one time if needed for sleep.  Marland Kitchen venlafaxine (EFFEXOR) 100 MG tablet Take 1 tablet (100 mg total) by mouth 3 (three) times daily with meals.  . [DISCONTINUED] clonazePAM (KLONOPIN) 0.5 MG tablet TAKE 1 TABLET BY MOUTH ONCE DAILY AS NEEDED FOR  ACUTE  ANXIETY  EMERGENCIES  ONLY  DO  NOT  EXCEED  ONE  PER  DAY  . [DISCONTINUED] hydroxychloroquine (PLAQUENIL) 200 MG tablet TAKE ONE TABLET BY MOUTH TWICE A DAY MONDAY-FRIDAY FOR RHEUMATOID ARTHRITIS   No facility-administered encounter medications on file as of  06/01/2020.    Surgical History: Past Surgical History:  Procedure Laterality Date  . CHOLECYSTECTOMY  2004  . COLONOSCOPY N/A 07/16/2017   Procedure: COLONOSCOPY;  Surgeon: Manya Silvas, MD;  Location: [facility];  Service: Endoscopy;  Laterality: N/A;  . EXPLORATORY LAPAROTOMY  2009  . GASTRIC BYPASS  2003   [place] N/A 03/01/2018   Procedure: EXPLORATORY LAPAROTOMY/ UMBILECTOMY;  Surgeon: Robert Bellow, MD;  Location: [facility] ORS;  Service: General;  Laterality: N/A;  . TONSILLECTOMY    . VENTRAL HERNIA REPAIR N/A 03/07/2018   Procedure: HERNIA REPAIR VENTRAL ADULT;  Surgeon: Robert Bellow, MD;  Location: [facility] ORS;  Service: General;  Laterality: N/A;    Medical History: Past Medical History:  Diagnosis Date  . Anemia   . Anxiety   . Bipolar 1 disorder ([place])   . Collagen vascular disease ([place])    rhematoid arthritis  . Constipation   . Fibromyalgia   . GERD (gastroesophageal reflux disease)   . Heart murmur    mild-asymptomatic  . Hepatic steatosis   . History of Roux-en-Y gastric bypass   . Hypotension   . Hypothyroidism   . Opioid abuse ([place])   . Osteoarthritis   . Osteoporosis   . PONV (postoperative nausea and vomiting)    nausea only  . Rheumatic fever   . Rheumatoid arthritis ([place])   . Thyroid disease     Family History: Family History  Problem Relation Age of Onset  . Depression Mother   . Dementia Mother   . Heart disease Mother   . Osteoporosis Mother   . Parkinson's disease Father   . Colon cancer Neg Hx       Review of Systems  Constitutional: Negative for activity change, chills, fatigue and unexpected weight change.  HENT: Positive for tinnitus. Negative for congestion, ear pain, postnasal drip, rhinorrhea, sneezing and sore throat.   Respiratory: Negative for cough, chest tightness, shortness of breath and wheezing.   Cardiovascular: Negative for chest pain and palpitations.  Gastrointestinal: Negative  for abdominal pain, constipation, diarrhea, nausea and vomiting.  Endocrine: Negative for cold intolerance, heat intolerance, polydipsia and polyuria.       Stable thyroid panel.   Genitourinary: Negative for dysuria, frequency and urgency.  Musculoskeletal: Positive for arthralgias and joint swelling. Negative for back pain and neck pain.       Chronic, generalized joint tenderness. Has RA and sees rheumatology routinely.  Right foot pain with mild swelling present.   Skin: Negative for rash.  Allergic/Immunologic: Positive for environmental allergies.  Neurological: Negative for dizziness, tremors, numbness and headaches.  Hematological: Negative for adenopathy. Does not bruise/bleed easily.  Psychiatric/Behavioral: Positive for dysphoric mood. Negative for  behavioral problems (Depression), sleep disturbance and suicidal ideas. The patient is nervous/anxious.        Sees psychiatry.      Today's Vitals   06/01/20 1405  BP: 136/85  Pulse: 81  Resp: 16  Temp: (!) 97.5 F (36.4 C)  SpO2: 99%  Weight: (!) 353 lb (160.1 kg)  Height: '5\' 5"'  (1.651 m)   Body mass index is 58.74 kg/m.  Physical Exam Vitals and nursing note reviewed.  Constitutional:      General: She is not in acute distress.    Appearance: Normal appearance. She is well-developed. She is obese. She is not diaphoretic.  HENT:     Head: Normocephalic and atraumatic.     Right Ear: Ear canal and external ear normal.     Left Ear: Ear canal and external ear normal.     Nose: Nose normal.     Mouth/Throat:     Pharynx: No oropharyngeal exudate.  Eyes:     Pupils: Pupils are equal, round, and reactive to light.  Neck:     Thyroid: No thyromegaly.     Vascular: No carotid bruit or JVD.     Trachea: No tracheal deviation.  Cardiovascular:     Rate and Rhythm: Normal rate and regular rhythm.     Pulses: Normal pulses.     Heart sounds: Normal heart sounds. No murmur heard.  No friction rub. No gallop.    Pulmonary:     Effort: Pulmonary effort is normal. No respiratory distress.     Breath sounds: Normal breath sounds. No wheezing or rales.  Chest:     Chest wall: No tenderness.  Abdominal:     General: Bowel sounds are normal.     Palpations: Abdomen is soft.     Tenderness: There is no abdominal tenderness.  Musculoskeletal:        General: Normal range of motion.     Cervical back: Normal range of motion and neck supple.     Comments: Generalized joint tenderness with point tenderness present. There is right foot tenderness with mild swelling present.  ROM and strength are intact.   Lymphadenopathy:     Cervical: No cervical adenopathy.  Skin:    General: Skin is warm and dry.  Neurological:     General: No focal deficit present.     Mental Status: She is alert and oriented to person, place, and time.     Cranial Nerves: No cranial nerve deficit.  Psychiatric:        Mood and Affect: Mood normal.        Behavior: Behavior normal.        Thought Content: Thought content normal.        Judgment: Judgment normal.    Depression screen [facility] 2/9 06/01/2020 01/27/2020 10/27/2019 05/30/2019 05/19/2019  Decreased Interest 0 0 0 0 0  Down, Depressed, Hopeless 0 0 1 0 0  PHQ - 2 Score 0 0 1 0 0  Altered sleeping - - - - -  Tired, decreased energy - - - - -  Change in appetite - - - - -  Feeling bad or failure about yourself  - - - - -  Trouble concentrating - - - - -  Moving slowly or fidgety/restless - - - - -  Suicidal thoughts - - - - -  PHQ-9 Score - - - - -  Difficult doing work/chores - - - - -  Some recent data might be hidden  Functional Status Survey: Is the patient deaf or have difficulty hearing?: No Does the patient have difficulty seeing, even when wearing glasses/contacts?: No Does the patient have difficulty concentrating, remembering, or making decisions?: No Does the patient have difficulty walking or climbing stairs?: Yes Does the patient have difficulty  dressing or bathing?: No Does the patient have difficulty doing errands alone such as visiting a doctor's office or shopping?: No  MMSE - [place] Exam 06/01/2020 06/15/2019  Orientation to time 5 5  Orientation to Place 5 5  Registration 3 3  Attention/ Calculation 5 5  Recall 3 3  Language- name 2 objects 2 2  Language- repeat 1 1  Language- follow 3 step command 3 3  Language- read & follow direction 1 1  Write a sentence 1 1  Copy design 1 1  Total score 30 30  Some encounter information is confidential and restricted. Go to Review Flowsheets activity to see all data.    Fall Risk  06/01/2020 01/27/2020 10/27/2019 07/28/2019 05/30/2019  Falls in the past year? 0 1 1 0 1  Number falls in past yr: - 0 0 - 0  Injury with Fall? - 1 1 - 1  Comment - 05/16/19 fell hit head and also hurt left wrist arm fracture - -      LABS: Recent Results (from the past 2160 hour(s))  CMP14+EGFR     Status: Abnormal   Collection Time: 03/30/20  1:37 PM  Result Value Ref Range   Glucose 77 65 - 99 mg/dL   BUN 20 6 - 24 mg/dL   Creatinine, Ser 0.81 0.57 - 1.00 mg/dL   GFR calc non Af Amer 80 >59 mL/min/1.73   GFR calc Af Amer 92 >59 mL/min/1.73    Comment: **[facility] currently reports eGFR in compliance with the current**   recommendations of the [employer]. [facility] will   update reporting as new guidelines are published from the NKF-ASN   Task force.    BUN/Creatinine Ratio 25 (H) 9 - 23   Sodium 142 134 - 144 mmol/L   Potassium 5.1 3.5 - 5.2 mmol/L   Chloride 105 96 - 106 mmol/L   CO2 24 20 - 29 mmol/L   Calcium 9.7 8.7 - 10.2 mg/dL   Total Protein 6.8 6.0 - 8.5 g/dL   Albumin 4.4 3.8 - 4.9 g/dL   Globulin, Total 2.4 1.5 - 4.5 g/dL   Albumin/Globulin Ratio 1.8 1.2 - 2.2   Bilirubin Total 0.3 0.0 - 1.2 mg/dL   Alkaline Phosphatase 104 48 - 121 IU/L   AST 15 0 - 40 IU/L   ALT 15 0 - 32 IU/L  Comprehensive metabolic panel     Status: None   Collection Time:  05/26/20 10:41 AM  Result Value Ref Range   Glucose 84 65 - 99 mg/dL   BUN 14 8 - 27 mg/dL   Creatinine, Ser 0.76 0.57 - 1.00 mg/dL   GFR calc non Af Amer 86 >59 mL/min/1.73   GFR calc Af Amer 99 >59 mL/min/1.73    Comment: **In accordance with recommendations from the NKF-ASN Task force,**   [facility] is in the process of updating its eGFR calculation to the   2021 CKD-EPI creatinine equation that estimates kidney function   without a race variable.    BUN/Creatinine Ratio 18 12 - 28   Sodium 143 134 - 144 mmol/L   Potassium 3.8 3.5 - 5.2 mmol/L   Chloride 105 96 -  106 mmol/L   CO2 21 20 - 29 mmol/L   Calcium 9.2 8.7 - 10.3 mg/dL   Total Protein 6.2 6.0 - 8.5 g/dL   Albumin 4.1 3.8 - 4.9 g/dL   Globulin, Total 2.1 1.5 - 4.5 g/dL   Albumin/Globulin Ratio 2.0 1.2 - 2.2   Bilirubin Total 0.3 0.0 - 1.2 mg/dL   Alkaline Phosphatase 115 44 - 121 IU/L    Comment:               **Please note reference interval change**   AST 21 0 - 40 IU/L   ALT 19 0 - 32 IU/L  CBC     Status: None   Collection Time: 05/26/20 10:41 AM  Result Value Ref Range   WBC 4.5 3.4 - 10.8 x10E3/uL   RBC 3.93 3.77 - 5.28 x10E6/uL   Hemoglobin 11.9 11.1 - 15.9 g/dL   Hematocrit 35.6 34.0 - 46.6 %   MCV 91 79 - 97 fL   MCH 30.3 26.6 - 33.0 pg   MCHC 33.4 31 - 35 g/dL   RDW 12.3 11.7 - 15.4 %   Platelets 242 150 - 450 x10E3/uL  Lipid Panel With LDL/HDL Ratio     Status: None   Collection Time: 05/26/20 10:41 AM  Result Value Ref Range   Cholesterol, Total 169 100 - 199 mg/dL   Triglycerides 136 0 - 149 mg/dL   HDL 73 >39 mg/dL   VLDL Cholesterol Cal 23 5 - 40 mg/dL   LDL Chol Calc (NIH) 73 0 - 99 mg/dL   LDL/HDL Ratio 1.0 0.0 - 3.2 ratio    Comment:                                     LDL/HDL Ratio                                             Men  Women                               1/2 Avg.Risk  1.0    1.5                                   Avg.Risk  3.6    3.2                                2X Avg.Risk   6.2    5.0                                3X Avg.Risk  8.0    6.1   T4, free     Status: None   Collection Time: 05/26/20 10:41 AM  Result Value Ref Range   Free T4 1.24 0.82 - 1.77 ng/dL  TSH     Status: None   Collection Time: 05/26/20 10:41 AM  Result Value Ref Range   TSH 1.550 0.450 - 4.500 uIU/mL  VITAMIN D 25 Hydroxy (Vit-D Deficiency, Fractures)     Status: None  Collection Time: 05/26/20 10:41 AM  Result Value Ref Range   Vit D, 25-Hydroxy 55.9 30.0 - 100.0 ng/mL    Comment: Vitamin D deficiency has been defined by the [place] practice guideline as a level of serum 25-OH vitamin D less than 20 ng/mL (1,2). The Endocrine Society went on to further define vitamin D insufficiency as a level between 21 and 29 ng/mL (2). 1. IOM (Institute of Medicine). 2010. Dietary reference    intakes for calcium and D. [place]: The    [employer]. 2. Holick MF, Binkley Nectar, Bischoff-Ferrari HA, et al.    Evaluation, treatment, and prevention of vitamin D    deficiency: an Endocrine Society clinical practice    guideline. JCEM. 2011 Jul; 96(7):1911-30.   TSH     Status: None   Collection Time: 06/22/20 12:45 PM  Result Value Ref Range   TSH 1.526 0.350 - 4.500 uIU/mL    Comment: Performed by a 3rd Generation assay with a functional sensitivity of <=0.01 uIU/mL. Performed at [facility], [address] [place], [place] [address]   Vitamin B12     Status: Abnormal   Collection Time: 06/22/20 12:45 PM  Result Value Ref Range   Vitamin B-12 2,379 (H) 180 - 914 pg/mL    Comment: (NOTE) This assay is not validated for testing neonatal or myeloproliferative syndrome specimens for Vitamin B12 levels. Performed at [facility], [place] [address] [place], [place] [address]   Ferritin     Status: None   Collection Time: 06/22/20 12:45 PM  Result Value Ref Range   Ferritin 73 11 - 307 ng/mL    Comment: Performed at  [facility], [address] [place], [place] [address]  Iron and TIBC     Status: None   Collection Time: 06/22/20 12:45 PM  Result Value Ref Range   Iron 102 28 - 170 ug/dL   TIBC 420 250 - 450 ug/dL   Saturation Ratios 24 10.4 - 31.8 %   UIBC 318 ug/dL    Comment: Performed at [facility], [address] [place], [place] [address]    Assessment/Plan: 1. Encounter for general adult medical examination with abnormal findings Annual health maintenance exam today.   2. Tinnitus of both ears No visible abnormalities today. Refer to ENT for further evaluation.  - Ambulatory referral to ENT  3. Intractable right heel pain Will get x-ray of right foot for forther evaluation.  - DG Foot Complete Right; Future  4. Acquired hypothyroidism Thyroid panel stable. Continue levothyroxine as prescribed.   5. Bipolar disorder, current episode mixed, moderate ([place]) Continue regular visits with psychiatry as scheduled.   6. Encounter for screening mammogram for malignant neoplasm of breast - MM DIGITAL SCREENING BILATERAL; Future  General Counseling: kalifa cadden understanding of the findings of todays visit and agrees with plan of treatment. I have discussed any further diagnostic evaluation that may be needed or ordered today. We also reviewed her medications today. she has been encouraged to call the office with any questions or concerns that should arise related to todays visit.    Counseling:  This patient was seen by Leretha Pol FNP Collaboration with Dr Lavera Guise as a part of collaborative care agreement  Orders Placed This Encounter  Procedures  . MM DIGITAL SCREENING BILATERAL  . DG Foot Complete Right  . Ambulatory referral to ENT      Total time spent:  28 Minutes  Time spent includes review of chart, medications, test results, and follow up plan with the patient.     Lavera Guise, MD  Internal Medicine

## 2020-06-09 ENCOUNTER — Telehealth: Payer: Self-pay

## 2020-06-09 NOTE — Telephone Encounter (Signed)
Please let the patient know that there are no acute issues with her foot. She does have degenerative changes of multiple joints of the foot. If bothering her still, we can refer to podiatry. Thanks.

## 2020-06-09 NOTE — Progress Notes (Signed)
Degenerative changes of multiple areas. Can refer to podiatry.

## 2020-06-09 NOTE — Telephone Encounter (Signed)
Pt notified for xray result she like to wait for podiatry she is going to call back for referral

## 2020-06-15 NOTE — Progress Notes (Deleted)
Office Visit Note  Patient: Jamie Bennett             Date of Birth: 07/23/1961           MRN: [id number]             PCP: Ronnell Freshwater, NP Referring: Ronnell Freshwater, NP Visit Date: 06/29/2020 Occupation: @GUAROCC @  Subjective:  No chief complaint on file.   History of Present Illness: Jamie Bennett is a 59 y.o. female ***   Activities of Daily Living:  Patient reports morning stiffness for *** {minute/hour:19697}.   Patient {ACTIONS;DENIES/REPORTS:21021675::"Denies"} nocturnal pain.  Difficulty dressing/grooming: {ACTIONS;DENIES/REPORTS:21021675::"Denies"} Difficulty climbing stairs: {ACTIONS;DENIES/REPORTS:21021675::"Denies"} Difficulty getting out of chair: {ACTIONS;DENIES/REPORTS:21021675::"Denies"} Difficulty using hands for taps, buttons, cutlery, and/or writing: {ACTIONS;DENIES/REPORTS:21021675::"Denies"}  No Rheumatology ROS completed.   PMFS History:  Patient Active Problem List   Diagnosis Date Noted   Neck pain, musculoskeletal 03/24/2020   Bipolar affective disorder, current episode mixed ([place]) 11/09/2019   Encounter for general adult medical examination with abnormal findings 06/15/2019   Closed fracture of left elbow 06/15/2019   Urinary tract infection without hematuria 06/15/2019   Encounter for long-term (current) use of medications 06/15/2019   Acute upper respiratory infection 06/06/2019   Exposure to COVID-19 virus 06/06/2019   Exercise-induced asthma 12/15/2018   Screening for breast cancer 05/29/2018   Duodenal ulcer disease 05/29/2018   Screening for malignant neoplasm of cervix 05/29/2018   Dysuria 05/29/2018   Surgical wound dehiscence, initial encounter 04/14/2018   Postoperative abdominal hernia with obstruction    Incarcerated ventral hernia 03/22/2018   SBO (small bowel obstruction) ([place]) 03/07/2018   Abscess of abdominal wall 03/01/2018   Persistent umbilical sinus 67/61/9509   Ringworm of body  01/23/2018   Atopic dermatitis 12/05/2017   Adjustment disorder with mixed anxiety and depressed mood 03/10/2017   Major depressive disorder 03/10/2017   Primary osteoarthritis of both knees 01/19/2017   Suicide attempt ([place]) 07/10/2016   Fibromyalgia 07/10/2016   High risk medication use 07/10/2016   AKI (acute kidney injury) ([place]) 11/09/2015   Elevated troponin 11/09/2015   Hypotension 11/09/2015   Respiratory failure (HCC)    Acute hepatic failure 11/08/2015   Drug overdose 11/08/2015   Fatty infiltration of liver 02/16/2015   Hepatic fibrosis 02/16/2015   Abnormal serum level of alkaline phosphatase 02/15/2015   Iron deficiency anemia 02/05/2015   Vitamin B 12 deficiency 02/05/2015   OP (osteoporosis) 06/16/2014   Rheumatoid arteritis ([place]) 03/02/2014   Hypothyroidism 03/02/2014   Rheumatic fever without heart involvement 03/02/2014   Adult hypothyroidism 03/02/2014   Arthritis of pelvic region, degenerative 03/02/2014   Bipolar 1 disorder, depressed ([place]) 02/26/2014   Bariatric surgery status 11/24/2013   Affective bipolar disorder ([place]) 11/24/2013   Bipolar affective disorder ([place]) 11/24/2013   Rheumatoid arthritis ([place]) 11/24/2013   Polysubstance (excluding opioids) dependence ([place]) 09/11/2013   Polysubstance dependence ([place]) 09/11/2013   Combined drug dependence excluding opioids ([place]) 09/11/2013   Arthritis or polyarthritis, rheumatoid ([place]) 09/05/2013   Leg weakness 09/05/2013    Past Medical History:  Diagnosis Date   Anemia    Anxiety    Bipolar 1 disorder (HCC)    Collagen vascular disease ([place])    rhematoid arthritis   Constipation    Fibromyalgia    GERD (gastroesophageal reflux disease)    Heart murmur    mild-asymptomatic   Hepatic steatosis    History of Roux-en-Y gastric bypass    Hypotension    Hypothyroidism  Opioid abuse (HCC)    Osteoarthritis    Osteoporosis    PONV (postoperative nausea  and vomiting)    nausea only   Rheumatic fever    Rheumatoid arthritis ([place])    Thyroid disease     Family History  Problem Relation Age of Onset   Depression Mother    Dementia Mother    Heart disease Mother    Osteoporosis Mother    Parkinson's disease Father    Colon cancer Neg Hx    Past Surgical History:  Procedure Laterality Date   CHOLECYSTECTOMY  2004   COLONOSCOPY N/A 07/16/2017   Procedure: COLONOSCOPY;  Surgeon: Manya Silvas, MD;  Location: [facility] ENDOSCOPY;  Service: Endoscopy;  Laterality: N/A;   EXPLORATORY LAPAROTOMY  2009   GASTRIC BYPASS  2003   [place] N/A 03/01/2018   Procedure: EXPLORATORY LAPAROTOMY/ UMBILECTOMY;  Surgeon: Robert Bellow, MD;  Location: [facility] ORS;  Service: General;  Laterality: N/A;   TONSILLECTOMY     VENTRAL HERNIA REPAIR N/A 03/07/2018   Procedure: HERNIA REPAIR VENTRAL ADULT;  Surgeon: Robert Bellow, MD;  Location: [facility] ORS;  Service: General;  Laterality: N/A;   Social History   Social History Narrative   Not on file   Immunization History  Administered Date(s) Administered   Influenza,inj,Quad PF,6+ Mos 04/24/2016   Moderna SARS-COVID-2 Vaccination 10/25/2019, 11/22/2019   Pneumococcal Polysaccharide-23 03/02/2018     Objective: Vital Signs: There were no vitals taken for this visit.   Physical Exam   Musculoskeletal Exam: ***  CDAI Exam: CDAI Score: -- Patient Global: --; Provider Global: -- Swollen: --; Tender: -- Joint Exam 06/29/2020   No joint exam has been documented for this visit   There is currently no information documented on the homunculus. Go to the Rheumatology activity and complete the homunculus joint exam.  Investigation: No additional findings.  Imaging: DG Foot Complete Right  Result Date: 06/03/2020 CLINICAL DATA:  Heel pain EXAM: RIGHT FOOT COMPLETE - 3+ VIEW COMPARISON:  None. FINDINGS: No acute fracture. Degenerative changes at the talar  navicular joint laterally. Degenerative changes at the tarsometatarsal joints. No heel spur. IMPRESSION: No acute osseous abnormality.  Midfoot degenerative changes. Electronically Signed   By: Macy Mis M.D.   On: 06/03/2020 09:42    Recent Labs: Lab Results  Component Value Date   WBC 4.5 05/26/2020   HGB 11.9 05/26/2020   PLT 242 05/26/2020   NA 143 05/26/2020   K 3.8 05/26/2020   CL 105 05/26/2020   CO2 21 05/26/2020   GLUCOSE 84 05/26/2020   BUN 14 05/26/2020   CREATININE 0.76 05/26/2020   BILITOT 0.3 05/26/2020   ALKPHOS 115 05/26/2020   AST 21 05/26/2020   ALT 19 05/26/2020   PROT 6.2 05/26/2020   ALBUMIN 4.1 05/26/2020   CALCIUM 9.2 05/26/2020   GFRAA 99 05/26/2020    Speciality Comments: PLQ Eye Exam: 07/22/2019 WNL @ [place] follow up in 6 months.  Procedures:  No procedures performed Allergies: Lactose intolerance (gi) and Sulfa antibiotics   Assessment / Plan:     Visit Diagnoses: Rheumatoid arthritis involving multiple sites with positive rheumatoid factor (HCC)  High risk medication use  Fibromyalgia  Trapezius muscle spasm  Trochanteric bursitis of left hip  Primary osteoarthritis of both knees  DDD (degenerative disc disease), cervical  DDD (degenerative disc disease), thoracic  DDD (degenerative disc disease), lumbar  Other osteoporosis without current pathological fracture  History of anemia  History of gastroesophageal reflux (GERD)  History of bipolar disorder  History of hypothyroidism  History of suicide attempt  Orders: No orders of the defined types were placed in this encounter.  No orders of the defined types were placed in this encounter.   Face-to-face time spent with patient was *** minutes. Greater than 50% of time was spent in counseling and coordination of care.  Follow-Up Instructions: No follow-ups on file.   Ofilia Neas, PA-C  Note - This record has been created using Dragon software.   Chart creation errors have been sought, but may not always  have been located. Such creation errors do not reflect on  the standard of medical care.

## 2020-06-17 ENCOUNTER — Other Ambulatory Visit (HOSPITAL_COMMUNITY): Payer: Self-pay | Admitting: *Deleted

## 2020-06-17 DIAGNOSIS — F411 Generalized anxiety disorder: Secondary | ICD-10-CM

## 2020-06-17 MED ORDER — CLONAZEPAM 0.5 MG PO TABS: ORAL_TABLET | ORAL | 0 refills | Status: DC

## 2020-06-20 ENCOUNTER — Other Ambulatory Visit: Payer: Self-pay | Admitting: Physician Assistant

## 2020-06-20 DIAGNOSIS — R69 Illness, unspecified: Secondary | ICD-10-CM | POA: Diagnosis not present

## 2020-06-20 DIAGNOSIS — M0579 Rheumatoid arthritis with rheumatoid factor of multiple sites without organ or systems involvement: Secondary | ICD-10-CM

## 2020-06-21 ENCOUNTER — Ambulatory Visit: Payer: Medicare HMO | Admitting: Oncology

## 2020-06-21 ENCOUNTER — Ambulatory Visit: Payer: Medicare HMO

## 2020-06-21 ENCOUNTER — Other Ambulatory Visit: Payer: Medicare HMO

## 2020-06-21 NOTE — Telephone Encounter (Signed)
Last Visit: 01/27/2020 Next Visit: 06/29/2020 Labs: 05/26/2020 WNL Eye exam: 07/22/2019 WNL  Current Dose per office note 01/27/2020: Plaquenil 200 mg 1 tablet twice daily Monday-Friday only DX: Rheumatoid arthritis involving multiple sites with positive rheumatoid factor   Okay to refill per Dr. Estanislado Pandy

## 2020-06-22 ENCOUNTER — Ambulatory Visit: Payer: Medicare HMO | Admitting: Oncology

## 2020-06-22 ENCOUNTER — Other Ambulatory Visit: Payer: Medicare HMO

## 2020-06-22 ENCOUNTER — Inpatient Hospital Stay: Payer: Medicare HMO | Attending: Oncology

## 2020-06-22 ENCOUNTER — Other Ambulatory Visit: Payer: Self-pay

## 2020-06-22 ENCOUNTER — Inpatient Hospital Stay: Payer: Medicare HMO

## 2020-06-22 ENCOUNTER — Ambulatory Visit: Payer: Medicare HMO

## 2020-06-22 ENCOUNTER — Inpatient Hospital Stay (HOSPITAL_BASED_OUTPATIENT_CLINIC_OR_DEPARTMENT_OTHER): Payer: Medicare HMO | Admitting: Oncology

## 2020-06-22 VITALS — BP 117/73 | HR 73 | Temp 97.5°F | Wt 346.2 lb

## 2020-06-22 DIAGNOSIS — K219 Gastro-esophageal reflux disease without esophagitis: Secondary | ICD-10-CM | POA: Insufficient documentation

## 2020-06-22 DIAGNOSIS — F1110 Opioid abuse, uncomplicated: Secondary | ICD-10-CM | POA: Insufficient documentation

## 2020-06-22 DIAGNOSIS — E538 Deficiency of other specified B group vitamins: Secondary | ICD-10-CM

## 2020-06-22 DIAGNOSIS — Z7982 Long term (current) use of aspirin: Secondary | ICD-10-CM | POA: Diagnosis not present

## 2020-06-22 DIAGNOSIS — M069 Rheumatoid arthritis, unspecified: Secondary | ICD-10-CM | POA: Insufficient documentation

## 2020-06-22 DIAGNOSIS — Z79899 Other long term (current) drug therapy: Secondary | ICD-10-CM | POA: Diagnosis not present

## 2020-06-22 DIAGNOSIS — K760 Fatty (change of) liver, not elsewhere classified: Secondary | ICD-10-CM | POA: Diagnosis not present

## 2020-06-22 DIAGNOSIS — R69 Illness, unspecified: Secondary | ICD-10-CM | POA: Diagnosis not present

## 2020-06-22 DIAGNOSIS — D509 Iron deficiency anemia, unspecified: Secondary | ICD-10-CM

## 2020-06-22 DIAGNOSIS — F329 Major depressive disorder, single episode, unspecified: Secondary | ICD-10-CM | POA: Diagnosis not present

## 2020-06-22 DIAGNOSIS — M810 Age-related osteoporosis without current pathological fracture: Secondary | ICD-10-CM | POA: Insufficient documentation

## 2020-06-22 DIAGNOSIS — E039 Hypothyroidism, unspecified: Secondary | ICD-10-CM | POA: Insufficient documentation

## 2020-06-22 DIAGNOSIS — Z9884 Bariatric surgery status: Secondary | ICD-10-CM

## 2020-06-22 LAB — FERRITIN: Ferritin: 73 ng/mL (ref 11–307)

## 2020-06-22 LAB — IRON AND TIBC
Iron: 102 ug/dL (ref 28–170)
Saturation Ratios: 24 % (ref 10.4–31.8)
TIBC: 420 ug/dL (ref 250–450)
UIBC: 318 ug/dL

## 2020-06-22 LAB — TSH: TSH: 1.526 u[IU]/mL (ref 0.350–4.500)

## 2020-06-22 MED ORDER — CYANOCOBALAMIN 1000 MCG/ML IJ SOLN
1000.0000 ug | Freq: Once | INTRAMUSCULAR | Status: AC
  Administered 2020-06-22: 1000 ug via INTRAMUSCULAR
  Filled 2020-06-22: qty 1

## 2020-06-22 NOTE — Progress Notes (Signed)
Hematology/Oncology Consult note [facility] [address]  [phone] [phone]  Patient Care Team: Ronnell Freshwater, NP as PCP - General (Family Medicine)   Name of the patient: Jamie Bennett  [id number]  [birth year]   Date of visit: 06/22/20  Diagnosis-iron and B12 deficiency anemia  Chief complaint/ Reason for visit-routine follow-up of anemia  Heme/Onc history: Patient is a 59 year old female with history of gastric bypass surgery in 2003. She is subsequent developed iron and B12 deficiency and has been getting IV iron from time to time. She was previously seen Dr. Mike Gip and transferring her care tome  Interval history-she is doing well overall and reports some ongoing fatigue.  No recent hospitalizations.  Denies any bleeding in her stool or urine.  She has occasional bruising over her bilateral forearms  ECOG PS- 1 Pain scale- 0  Review of systems- Review of Systems  Constitutional: Positive for malaise/fatigue. Negative for chills, fever and weight loss.  HENT: Negative for congestion, ear discharge and nosebleeds.   Eyes: Negative for blurred vision.  Respiratory: Negative for cough, hemoptysis, sputum production, shortness of breath and wheezing.   Cardiovascular: Negative for chest pain, palpitations, orthopnea and claudication.  Gastrointestinal: Negative for abdominal pain, blood in stool, constipation, diarrhea, heartburn, melena, nausea and vomiting.  Genitourinary: Negative for dysuria, flank pain, frequency, hematuria and urgency.  Musculoskeletal: Negative for back pain, joint pain and myalgias.  Skin: Negative for rash.  Neurological: Negative for dizziness, tingling, focal weakness, seizures, weakness and headaches.  Endo/Heme/Allergies: Does not bruise/bleed easily.  Psychiatric/Behavioral: Negative for depression and suicidal ideas. The patient does not have insomnia.       Allergies  Allergen Reactions  .  Lactose Intolerance (Gi) Other (See Comments)    Bloating and GI distress  . Sulfa Antibiotics Hives     Past Medical History:  Diagnosis Date  . Anemia   . Anxiety   . Bipolar 1 disorder ([place])   . Collagen vascular disease ([place])    rhematoid arthritis  . Constipation   . Fibromyalgia   . GERD (gastroesophageal reflux disease)   . Heart murmur    mild-asymptomatic  . Hepatic steatosis   . History of Roux-en-Y gastric bypass   . Hypotension   . Hypothyroidism   . Opioid abuse ([place])   . Osteoarthritis   . Osteoporosis   . PONV (postoperative nausea and vomiting)    nausea only  . Rheumatic fever   . Rheumatoid arthritis ([place])   . Thyroid disease      Past Surgical History:  Procedure Laterality Date  . CHOLECYSTECTOMY  2004  . COLONOSCOPY N/A 07/16/2017   Procedure: COLONOSCOPY;  Surgeon: Manya Silvas, MD;  Location: [facility] ENDOSCOPY;  Service: Endoscopy;  Laterality: N/A;  . EXPLORATORY LAPAROTOMY  2009  . GASTRIC BYPASS  2003   [place] N/A 03/01/2018   Procedure: EXPLORATORY LAPAROTOMY/ UMBILECTOMY;  Surgeon: Robert Bellow, MD;  Location: [facility] ORS;  Service: General;  Laterality: N/A;  . TONSILLECTOMY    . VENTRAL HERNIA REPAIR N/A 03/07/2018   Procedure: HERNIA REPAIR VENTRAL ADULT;  Surgeon: Robert Bellow, MD;  Location: [facility] ORS;  Service: General;  Laterality: N/A;    Social History   Socioeconomic History  . Marital status: Married    Spouse name: Not on file  . Number of children: Not on file  . Years of education: Not on file  . Highest education level: Not on file  Occupational History  . Not on file  Tobacco Use  . Smoking status: Never Smoker  . Smokeless tobacco: Never Used  Vaping Use  . Vaping Use: Never used  Substance and Sexual Activity  . Alcohol use: No    Alcohol/week: 0.0 standard drinks  . Drug use: No  . Sexual activity: Yes  Other Topics Concern  . Not on file  Social History Narrative  . Not  on file   Social Determinants of Health   Financial Resource Strain:   . Difficulty of Paying Living Expenses: Not on file  Food Insecurity:   . Worried About Charity fundraiser in the Last Year: Not on file  . Ran Out of Food in the Last Year: Not on file  Transportation Needs:   . Lack of Transportation (Medical): Not on file  . Lack of Transportation (Non-Medical): Not on file  Physical Activity:   . Days of Exercise per Week: Not on file  . Minutes of Exercise per Session: Not on file  Stress:   . Feeling of Stress : Not on file  Social Connections:   . Frequency of Communication with Friends and Family: Not on file  . Frequency of Social Gatherings with Friends and Family: Not on file  . Attends Religious Services: Not on file  . Active Member of Clubs or Organizations: Not on file  . Attends Archivist Meetings: Not on file  . Marital Status: Not on file  Intimate Partner Violence:   . Fear of Current or Ex-Partner: Not on file  . Emotionally Abused: Not on file  . Physically Abused: Not on file  . Sexually Abused: Not on file    Family History  Problem Relation Age of Onset  . Depression Mother   . Dementia Mother   . Heart disease Mother   . Osteoporosis Mother   . Parkinson's disease Father   . Colon cancer Neg Hx      Current Outpatient Medications:  .  albuterol (VENTOLIN HFA) 108 (90 Base) MCG/ACT inhaler, Inhale 2 puffs into the lungs every 6 (six) hours as needed for wheezing or shortness of breath., Disp: 1 Inhaler, Rfl: 3 .  aspirin EC 81 MG tablet, Take 81 mg by mouth daily., Disp: , Rfl:  .  busPIRone (BUSPAR) 30 MG tablet, 1 bid, Disp: 180 tablet, Rfl: 1 .  Cholecalciferol (HM VITAMIN D3) 4000 units CAPS, Take 4,000 Units by mouth daily. , Disp: , Rfl:  .  ciprofloxacin (CIPRO) 500 MG tablet, Take 1 tablet (500 mg total) by mouth 2 (two) times daily., Disp: 20 tablet, Rfl: 0 .  clonazePAM (KLONOPIN) 0.5 MG tablet, TAKE 1 TABLET BY MOUTH  ONCE DAILY AS NEEDED FOR  ACUTE  ANXIETY  EMERGENCIES  ONLY  DO  NOT  EXCEED  ONE  PER  DAY, Disp: 30 tablet, Rfl: 0 .  Coenzyme Q-10 200 MG CAPS, Take 200 mg by mouth 2 (two) times daily. , Disp: , Rfl:  .  cyanocobalamin (,VITAMIN B-12,) 1000 MCG/ML injection, Inject 1,000 mcg into the muscle every 3 (three) months. , Disp: , Rfl:  .  denosumab (PROLIA) 60 MG/ML SOSY injection, Inject 60 mg into the skin every 6 (six) months., Disp: , Rfl:  .  desipramine (NORPRAMIN) 25 MG tablet, 1  qam, Disp: 90 tablet, Rfl: 3 .  esomeprazole (NEXIUM) 20 MG capsule, Take 20 mg by mouth daily. , Disp: , Rfl:  .  Fiber, Guar Gum, CHEW, Chew  5 mg by mouth daily. , Disp: , Rfl:  .  hydroxychloroquine (PLAQUENIL) 200 MG tablet, TAKE ONE TABLET BY MOUTH TWICE A DAY MONDAY-FRIDAY FOR RHEUMATOID ARTHRITIS, Disp: 120 tablet, Rfl: 0 .  hydrOXYzine (ATARAX/VISTARIL) 50 MG tablet, Take 1 tablet (50 mg total) by mouth 3 (three) times daily as needed for anxiety., Disp: 270 tablet, Rfl: 1 .  ibuprofen (ADVIL,MOTRIN) 100 MG tablet, Take 100 mg by mouth as needed for fever., Disp: , Rfl:  .  Krill Oil 500 MG CAPS, Take 500 mg by mouth daily., Disp: , Rfl:  .  lamoTRIgine (LAMICTAL) 150 MG tablet, Take 1 tablet (150 mg total) by mouth 2 (two) times daily., Disp: 180 tablet, Rfl: 1 .  levothyroxine (SYNTHROID) 112 MCG tablet, Take 1 tablet (112 mcg total) by mouth daily., Disp: 90 tablet, Rfl: 1 .  loratadine (CLARITIN) 10 MG tablet, Take 10 mg by mouth daily as needed for allergies., Disp: , Rfl:  .  polyethylene glycol (MIRALAX) packet, Take 17 g by mouth daily., Disp: 14 each, Rfl: 0 .  tiZANidine (ZANAFLEX) 4 MG tablet, Take 1 tablet (4 mg total) by mouth 3 (three) times daily., Disp: 90 tablet, Rfl: 1 .  traZODone (DESYREL) 100 MG tablet, Take 1 tablet (100 mg total) by mouth at bedtime as needed and may repeat dose one time if needed for sleep., Disp: 60 tablet, Rfl: 5 .  venlafaxine (EFFEXOR) 100 MG tablet, Take 1 tablet  (100 mg total) by mouth 3 (three) times daily with meals., Disp: 90 tablet, Rfl: 4 .  sucralfate (CARAFATE) 1 g tablet, Take 1 tablet (1 g total) by mouth 2 (two) times daily., Disp: 180 tablet, Rfl: 3  Physical exam:  Vitals:   06/22/20 1336  BP: 117/73  Pulse: 73  Temp: (!) 97.5 F (36.4 C)  TempSrc: Tympanic  SpO2: 99%  Weight: (!) 346 lb 3.2 oz (157 kg)   Physical Exam Constitutional:      Appearance: She is obese.  HENT:     Head: Normocephalic and atraumatic.  Eyes:     Pupils: Pupils are equal, round, and reactive to light.  Cardiovascular:     Rate and Rhythm: Normal rate and regular rhythm.     Heart sounds: Normal heart sounds.  Pulmonary:     Effort: Pulmonary effort is normal.     Breath sounds: Normal breath sounds.  Abdominal:     General: Bowel sounds are normal.     Palpations: Abdomen is soft.  Musculoskeletal:     Cervical back: Normal range of motion.  Skin:    Comments: Bruising noted over bilateral forearms  Neurological:     Mental Status: She is alert and oriented to person, place, and time.      CMP Latest Ref Rng & Units 05/26/2020  Glucose 65 - 99 mg/dL 84  BUN 8 - 27 mg/dL 14  Creatinine 0.57 - 1.00 mg/dL 0.76  Sodium 134 - 144 mmol/L 143  Potassium 3.5 - 5.2 mmol/L 3.8  Chloride 96 - 106 mmol/L 105  CO2 20 - 29 mmol/L 21  Calcium 8.7 - 10.3 mg/dL 9.2  Total Protein 6.0 - 8.5 g/dL 6.2  Total Bilirubin 0.0 - 1.2 mg/dL 0.3  Alkaline Phos 44 - 121 IU/L 115  AST 0 - 40 IU/L 21  ALT 0 - 32 IU/L 19   CBC Latest Ref Rng & Units 05/26/2020  WBC 3.4 - 10.8 x10E3/uL 4.5  Hemoglobin 11.1 - 15.9 g/dL 11.9  Hematocrit 34.0 - 46.6 % 35.6  Platelets 150 - 450 x10E3/uL 242    No images are attached to the encounter.  DG Foot Complete Right  Result Date: 06/03/2020 CLINICAL DATA:  Heel pain EXAM: RIGHT FOOT COMPLETE - 3+ VIEW COMPARISON:  None. FINDINGS: No acute fracture. Degenerative changes at the talar navicular joint laterally.  Degenerative changes at the tarsometatarsal joints. No heel spur. IMPRESSION: No acute osseous abnormality.  Midfoot degenerative changes. Electronically Signed   By: Macy Mis M.D.   On: 06/03/2020 09:42     Assessment and plan- Patient is a 59 y.o. female with iron B12 deficiency anemia here for routine follow-up  Hemoglobin 3 weeks ago was normal at 11.9.  Iron studies from today are pending.  She will receive B12 shot today and continue to receive monthly B12 shots.  I will see her back in 6 months with CBC ferritin and iron studies   Visit Diagnosis 1. Iron deficiency anemia, unspecified iron deficiency anemia type   2. Vitamin B 12 deficiency      Dr. Randa Evens, MD, MPH [facility] [phone] 06/22/2020 2:53 PM

## 2020-06-23 LAB — VITAMIN B12: Vitamin B-12: 2379 pg/mL — ABNORMAL HIGH (ref 180–914)

## 2020-06-23 NOTE — Progress Notes (Signed)
Office Visit Note  Patient: Jamie Bennett             Date of Birth: 02-09-1961           MRN: [id number]             PCP: Ronnell Freshwater, NP Referring: Ronnell Freshwater, NP Visit Date: 07/01/2020 Occupation: @GUAROCC @  Subjective:  Pain in both hands  History of Present Illness: Jamie Bennett is a 59 y.o. female with history of seropositive rheumatoid arthritis and osteoarthritis.  Patient is taking Plaquenil 200 mg 1 tablet by mouth twice daily Monday through Friday.  She is tolerating Plaquenil without any side effects.  Patient reports that she has been experiencing increased pain, stiffness, and swelling in both hands especially first thing in the morning.  She has been having to take Tylenol every morning to help with her pain and stiffness.  She continues to hand sew on a daily basis but has had increased pain in bilateral first MCP joints.  She states she is also been having increased discomfort in both knee joints.  She has had 2 recent falls.  She would like to reapply for Visco gel injections for both knees.  She continues have generalized myalgias and muscle tenderness due to underlying fibromyalgia.  She takes Zanaflex 4 mg by mouth in the morning to help with muscle spasms.  She also has been taking trazodone at bedtime for insomnia. She would like to discuss combination therapy or other treatment options for rheumatoid arthritis.  Patient reports that in the past she took methotrexate but developed significant elevation in LFTs.  She says she was also on Humira in the past but discontinued due to clinically doing well. She denies any recent infections.   Activities of Daily Living:  Patient reports morning stiffness for 45-60 minutes.   Patient Reports nocturnal pain.  Difficulty dressing/grooming: Denies Difficulty climbing stairs: Reports Difficulty getting out of chair: Denies Difficulty using hands for taps, buttons, cutlery, and/or writing:  Reports  Review of Systems  Constitutional: Positive for fatigue.  HENT: Negative for mouth sores, mouth dryness and nose dryness.   Eyes: Negative for pain, itching and dryness.  Respiratory: Negative for shortness of breath and difficulty breathing.   Cardiovascular: Negative for chest pain and palpitations.  Gastrointestinal: Negative for blood in stool, constipation and diarrhea.  Endocrine: Negative for increased urination.  Genitourinary: Negative for difficulty urinating.  Musculoskeletal: Positive for arthralgias, joint pain, joint swelling and morning stiffness. Negative for myalgias, muscle tenderness and myalgias.  Skin: Negative for color change, rash and redness.  Allergic/Immunologic: Negative for susceptible to infections.  Neurological: Positive for weakness. Negative for dizziness, numbness, headaches and memory loss.  Hematological: Positive for bruising/bleeding tendency.  Psychiatric/Behavioral: Negative for confusion.    PMFS History:  Patient Active Problem List   Diagnosis Date Noted   Neck pain, musculoskeletal 03/24/2020   Bipolar affective disorder, current episode mixed ([place]) 11/09/2019   Encounter for general adult medical examination with abnormal findings 06/15/2019   Closed fracture of left elbow 06/15/2019   Urinary tract infection without hematuria 06/15/2019   Encounter for long-term (current) use of medications 06/15/2019   Acute upper respiratory infection 06/06/2019   Exposure to COVID-19 virus 06/06/2019   Exercise-induced asthma 12/15/2018   Screening for breast cancer 05/29/2018   Duodenal ulcer disease 05/29/2018   Screening for malignant neoplasm of cervix 05/29/2018   Dysuria 05/29/2018   Surgical wound dehiscence, initial encounter 04/14/2018  Postoperative abdominal hernia with obstruction    Incarcerated ventral hernia 03/22/2018   SBO (small bowel obstruction) ([place]) 03/07/2018   Abscess of abdominal wall  03/01/2018   Persistent umbilical sinus 13/02/6577   Ringworm of body 01/23/2018   Atopic dermatitis 12/05/2017   Adjustment disorder with mixed anxiety and depressed mood 03/10/2017   Major depressive disorder 03/10/2017   Primary osteoarthritis of both knees 01/19/2017   Suicide attempt ([name]) 07/10/2016   Fibromyalgia 07/10/2016   High risk medication use 07/10/2016   AKI (acute kidney injury) ([place]) 11/09/2015   Elevated troponin 11/09/2015   Hypotension 11/09/2015   Respiratory failure ([place])    Acute hepatic failure 11/08/2015   Drug overdose 11/08/2015   Fatty infiltration of liver 02/16/2015   Hepatic fibrosis 02/16/2015   Abnormal serum level of alkaline phosphatase 02/15/2015   Iron deficiency anemia 02/05/2015   Vitamin B 12 deficiency 02/05/2015   OP (osteoporosis) 06/16/2014   Rheumatoid arteritis ([place]) 03/02/2014   Hypothyroidism 03/02/2014   Rheumatic fever without heart involvement 03/02/2014   Adult hypothyroidism 03/02/2014   Arthritis of pelvic region, degenerative 03/02/2014   Bipolar 1 disorder, depressed ([place]) 02/26/2014   Bariatric surgery status 11/24/2013   Affective bipolar disorder ([place]) 11/24/2013   Bipolar affective disorder ([place]) 11/24/2013   Rheumatoid arthritis ([place]) 11/24/2013   Polysubstance (excluding opioids) dependence ([place]) 09/11/2013   Polysubstance dependence ([place]) 09/11/2013   Combined drug dependence excluding opioids ([place]) 09/11/2013   Arthritis or polyarthritis, rheumatoid ([place]) 09/05/2013   Leg weakness 09/05/2013    Past Medical History:  Diagnosis Date   Anemia    Anxiety    Bipolar 1 disorder (HCC)    Collagen vascular disease ([place])    rhematoid arthritis   Constipation    Fibromyalgia    GERD (gastroesophageal reflux disease)    Heart murmur    mild-asymptomatic   Hepatic steatosis    History of Roux-en-Y gastric bypass    Hypotension    Hypothyroidism    Opioid abuse  ([place])    Osteoarthritis    Osteoporosis    PONV (postoperative nausea and vomiting)    nausea only   Rheumatic fever    Rheumatoid arthritis ([place])    Thyroid disease     Family History  Problem Relation Age of Onset   Depression Mother    Dementia Mother    Heart disease Mother    Osteoporosis Mother    Parkinson's disease Father    Colon cancer Neg Hx    Past Surgical History:  Procedure Laterality Date   CHOLECYSTECTOMY  2004   COLONOSCOPY N/A 07/16/2017   Procedure: COLONOSCOPY;  Surgeon: Manya Silvas, MD;  Location: [facility] ENDOSCOPY;  Service: Endoscopy;  Laterality: N/A;   EXPLORATORY LAPAROTOMY  2009   GASTRIC BYPASS  2003   [place] N/A 03/01/2018   Procedure: EXPLORATORY LAPAROTOMY/ UMBILECTOMY;  Surgeon: Robert Bellow, MD;  Location: [facility] ORS;  Service: General;  Laterality: N/A;   TONSILLECTOMY     VENTRAL HERNIA REPAIR N/A 03/07/2018   Procedure: HERNIA REPAIR VENTRAL ADULT;  Surgeon: Robert Bellow, MD;  Location: [facility] ORS;  Service: General;  Laterality: N/A;   Social History   Social History Narrative   Not on file   Immunization History  Administered Date(s) Administered   Influenza,inj,Quad PF,6+ Mos 04/24/2016   Moderna SARS-COVID-2 Vaccination 10/25/2019, 11/22/2019, 06/02/2020   Pneumococcal Polysaccharide-23 03/02/2018     Objective: Vital Signs: BP (!) 145/85 (BP Location: Left Wrist,  Patient Position: Sitting, Cuff Size: Normal)    Pulse 71    Resp 17    Ht 5\' 6"  (1.676 m)    Wt (!) 345 lb 9.6 oz (156.8 kg)    BMI 55.78 kg/m    Physical Exam Vitals and nursing note reviewed.  Constitutional:      Appearance: She is well-developed.  HENT:     Head: Normocephalic and atraumatic.  Eyes:     Conjunctiva/sclera: Conjunctivae normal.  Pulmonary:     Effort: Pulmonary effort is normal.  Abdominal:     Palpations: Abdomen is soft.  Musculoskeletal:     Cervical back: Normal range of motion.   Skin:    General: Skin is warm and dry.     Capillary Refill: Capillary refill takes less than 2 seconds.  Neurological:     Mental Status: She is alert and oriented to person, place, and time.  Psychiatric:        Behavior: Behavior normal.      Musculoskeletal Exam: C-spine, thoracic spine, and lumbar spine good ROM.  Shoulder joints, elbow joints, wrist joints, MCPs, PIPs, and DIPs good ROM.  Tenderness over both 1st MCP joints.  Tenderness right 3rd, 4th, and 5th PIP joint. Complete fist formation bilaterally  Knee joints good ROM with discomfort bilaterally.  Warmth of the right knee joint noted.  Bilateral knee crepitus.  Ankle joints good ROM with no tenderness or inflammation.   CDAI Exam: CDAI Score: 11  Patient Global: 5 mm; Provider Global: 5 mm Swollen: 2 ; Tender: 8  Joint Exam 07/01/2020      Right  Left  MCP 1   Tender   Tender  MCP 5   Tender     PIP 3  Swollen Tender     PIP 4   Tender     PIP 5   Tender     Knee  Swollen Tender   Tender     Investigation: No additional findings.  Imaging: No results found.  Recent Labs: Lab Results  Component Value Date   WBC 4.5 05/26/2020   HGB 11.9 05/26/2020   PLT 242 05/26/2020   NA 143 05/26/2020   K 3.8 05/26/2020   CL 105 05/26/2020   CO2 21 05/26/2020   GLUCOSE 84 05/26/2020   BUN 14 05/26/2020   CREATININE 0.76 05/26/2020   BILITOT 0.3 05/26/2020   ALKPHOS 115 05/26/2020   AST 21 05/26/2020   ALT 19 05/26/2020   PROT 6.2 05/26/2020   ALBUMIN 4.1 05/26/2020   CALCIUM 9.2 05/26/2020   GFRAA 99 05/26/2020    Speciality Comments: PLQ Eye Exam: 07/22/2019 WNL @ [place] follow up in 6 months.  Procedures:  No procedures performed Allergies: Lactose intolerance (gi) and Sulfa antibiotics      Assessment / Plan:     Visit Diagnoses: Rheumatoid arthritis involving multiple sites with positive rheumatoid factor ([place]): She has tenderness over bilateral first MCP joints, right fifth MCP  joint, and right 3rd, 4th, and 5th PIP joints. No obvious synovitis was noted on examination today.  She is currently taking Plaquenil 200 mg 1 tablet by mouth twice daily Monday through Friday.  She has been tolerating Plaquenil without any side effects and has not missed any doses recently.  She has been experiencing increased pain, stiffness, and swelling in both hands especially first thing in the morning.  We discussed that her discomfort is likely due to underlying osteoarthritis in both hands.  We  will try increasing the dose of Plaquenil to 200 mg 1 tablet by mouth twice daily for 3 months and reassess at that time.  She will try using Voltaren gel topically as needed for pain relief. If her hand pain and inflammation persists or worsens we will schedule an ultrasound of both hands to assess for synovitis.  She is in agreement with the plan.  She will follow-up in the office in 3 months.   High risk medication use - Plaquenil 200 mg 1 tablet by mouth twice daily.  PLQ Eye Exam: 07/22/2019 WNL @ [facility] follow up in 6 months.  She is due to update Plaquenil eye exam.  She was given a Plaquenil eye exam form to take with her to her upcoming appointment.  CBC and CMP were within normal limits on 05/26/20.   She has not had any recent infections.  She has received all 3 COVID-19 vaccinations.   Fibromyalgia - She has generalized hyperalgesia and positive tender points on examination today.  She experiences generalized myalgias and muscle tenderness due to fibromyalgia.  She takes tizanidine 4 mg by mouth 3 times daily for muscle spasms.  She continues to have chronic fatigue secondary to insomnia.  She takes trazodone at bedtime for insomnia.    Trapezius muscle spasm: She has trapezius muscle tension and muscle tenderness bilaterally.  Trochanteric bursitis of left hip: She has intermittent discomfort.   Primary osteoarthritis of both knees: She has chronic pain in both knee joints.  She  has good range of motion of both knee joints with crepitus bilaterally.  Warmth of the right knee joint was noted.  No effusion noted.  X-rays of both knee joints were updated today.  We will reapply for Visco gel injections which will be ultrasound-guided for the best results.  Chronic pain of both knees - She continues to have discomfort in both knee joints.  She has had 2 recent falls and has ecchymosis on the anterior surface of both knee joints.  She would like to reapply for Visco gel injections for both knees.  X-rays of both knees were updated today. plan: XR KNEE 3 VIEW LEFT, XR KNEE 3 VIEW RIGHT  DDD (degenerative disc disease), cervical: She has good range of motion of the C-spine on examination today.  She is not experiencing any discomfort in her neck at this time.    DDD (degenerative disc disease), thoracic: No midline spinal tenderness.   DDD (degenerative disc disease), lumbar: No midline spinal tenderness.   Other osteoporosis without current pathological fracture: DEXA is not in epic for review.  She is on Prolia 60 mg subcutaneous injections every 6 months.  She continues to take vitamin D 4000 units daily.  She has had 2 recent falls.  No recent fractures.  Other medical conditions are listed as follows:  History of anemia  History of gastroesophageal reflux (GERD)  History of bipolar disorder  History of hypothyroidism    Orders: Orders Placed This Encounter  Procedures   XR KNEE 3 VIEW LEFT   XR KNEE 3 VIEW RIGHT   No orders of the defined types were placed in this encounter.     Follow-Up Instructions: Return in 3 months (on 09/29/2020) for Rheumatoid arthritis.   Ofilia Neas, PA-C  Note - This record has been created using Dragon software.  Chart creation errors have been sought, but may not always  have been located. Such creation errors do not reflect on  the standard  of medical care.

## 2020-06-29 ENCOUNTER — Ambulatory Visit: Payer: Medicare HMO | Admitting: Physician Assistant

## 2020-06-29 DIAGNOSIS — Z9151 Personal history of suicidal behavior: Secondary | ICD-10-CM

## 2020-06-29 DIAGNOSIS — Z8659 Personal history of other mental and behavioral disorders: Secondary | ICD-10-CM

## 2020-06-29 DIAGNOSIS — M5134 Other intervertebral disc degeneration, thoracic region: Secondary | ICD-10-CM

## 2020-06-29 DIAGNOSIS — M5136 Other intervertebral disc degeneration, lumbar region: Secondary | ICD-10-CM

## 2020-06-29 DIAGNOSIS — M62838 Other muscle spasm: Secondary | ICD-10-CM

## 2020-06-29 DIAGNOSIS — M797 Fibromyalgia: Secondary | ICD-10-CM

## 2020-06-29 DIAGNOSIS — M7062 Trochanteric bursitis, left hip: Secondary | ICD-10-CM

## 2020-06-29 DIAGNOSIS — M818 Other osteoporosis without current pathological fracture: Secondary | ICD-10-CM

## 2020-06-29 DIAGNOSIS — M0579 Rheumatoid arthritis with rheumatoid factor of multiple sites without organ or systems involvement: Secondary | ICD-10-CM

## 2020-06-29 DIAGNOSIS — M170 Bilateral primary osteoarthritis of knee: Secondary | ICD-10-CM

## 2020-06-29 DIAGNOSIS — Z8639 Personal history of other endocrine, nutritional and metabolic disease: Secondary | ICD-10-CM

## 2020-06-29 DIAGNOSIS — Z8719 Personal history of other diseases of the digestive system: Secondary | ICD-10-CM

## 2020-06-29 DIAGNOSIS — Z862 Personal history of diseases of the blood and blood-forming organs and certain disorders involving the immune mechanism: Secondary | ICD-10-CM

## 2020-06-29 DIAGNOSIS — M5030 Other cervical disc degeneration, unspecified cervical region: Secondary | ICD-10-CM

## 2020-06-29 DIAGNOSIS — R69 Illness, unspecified: Secondary | ICD-10-CM | POA: Diagnosis not present

## 2020-06-29 DIAGNOSIS — Z79899 Other long term (current) drug therapy: Secondary | ICD-10-CM

## 2020-07-01 ENCOUNTER — Other Ambulatory Visit: Payer: Self-pay

## 2020-07-01 ENCOUNTER — Ambulatory Visit: Payer: Self-pay

## 2020-07-01 ENCOUNTER — Ambulatory Visit: Payer: Medicare HMO | Admitting: Physician Assistant

## 2020-07-01 ENCOUNTER — Telehealth: Payer: Self-pay

## 2020-07-01 ENCOUNTER — Encounter: Payer: Self-pay | Admitting: Physician Assistant

## 2020-07-01 VITALS — BP 145/85 | HR 71 | Resp 17 | Ht 66.0 in | Wt 345.6 lb

## 2020-07-01 DIAGNOSIS — M62838 Other muscle spasm: Secondary | ICD-10-CM | POA: Diagnosis not present

## 2020-07-01 DIAGNOSIS — M170 Bilateral primary osteoarthritis of knee: Secondary | ICD-10-CM

## 2020-07-01 DIAGNOSIS — M797 Fibromyalgia: Secondary | ICD-10-CM | POA: Diagnosis not present

## 2020-07-01 DIAGNOSIS — Z79899 Other long term (current) drug therapy: Secondary | ICD-10-CM | POA: Diagnosis not present

## 2020-07-01 DIAGNOSIS — M25561 Pain in right knee: Secondary | ICD-10-CM | POA: Diagnosis not present

## 2020-07-01 DIAGNOSIS — M25562 Pain in left knee: Secondary | ICD-10-CM

## 2020-07-01 DIAGNOSIS — M0579 Rheumatoid arthritis with rheumatoid factor of multiple sites without organ or systems involvement: Secondary | ICD-10-CM

## 2020-07-01 DIAGNOSIS — M5030 Other cervical disc degeneration, unspecified cervical region: Secondary | ICD-10-CM | POA: Diagnosis not present

## 2020-07-01 DIAGNOSIS — M7062 Trochanteric bursitis, left hip: Secondary | ICD-10-CM

## 2020-07-01 DIAGNOSIS — Z862 Personal history of diseases of the blood and blood-forming organs and certain disorders involving the immune mechanism: Secondary | ICD-10-CM | POA: Diagnosis not present

## 2020-07-01 DIAGNOSIS — G8929 Other chronic pain: Secondary | ICD-10-CM

## 2020-07-01 DIAGNOSIS — M5134 Other intervertebral disc degeneration, thoracic region: Secondary | ICD-10-CM | POA: Diagnosis not present

## 2020-07-01 DIAGNOSIS — M818 Other osteoporosis without current pathological fracture: Secondary | ICD-10-CM

## 2020-07-01 DIAGNOSIS — M5136 Other intervertebral disc degeneration, lumbar region: Secondary | ICD-10-CM | POA: Diagnosis not present

## 2020-07-01 DIAGNOSIS — Z8659 Personal history of other mental and behavioral disorders: Secondary | ICD-10-CM

## 2020-07-01 DIAGNOSIS — Z8719 Personal history of other diseases of the digestive system: Secondary | ICD-10-CM

## 2020-07-01 DIAGNOSIS — Z8639 Personal history of other endocrine, nutritional and metabolic disease: Secondary | ICD-10-CM

## 2020-07-01 MED ORDER — HYDROXYCHLOROQUINE SULFATE 200 MG PO TABS: 200.0000 mg | ORAL_TABLET | Freq: Two times a day (BID) | ORAL | 0 refills | Status: DC

## 2020-07-01 NOTE — Telephone Encounter (Signed)
Please apply for bilateral knee visco, per Hazel Sams, PA-C. Thanks!   The injections will need to be ultrasound guided.

## 2020-07-02 NOTE — Telephone Encounter (Signed)
I spoke with patient, and advised her we will be applying for VOB in January for Visco due to the time limitations at this point for 2021. Patient understands, and is in agreement with this.

## 2020-07-13 DIAGNOSIS — R69 Illness, unspecified: Secondary | ICD-10-CM | POA: Diagnosis not present

## 2020-07-16 ENCOUNTER — Other Ambulatory Visit (HOSPITAL_COMMUNITY): Payer: Self-pay | Admitting: *Deleted

## 2020-07-16 DIAGNOSIS — F411 Generalized anxiety disorder: Secondary | ICD-10-CM

## 2020-07-16 MED ORDER — CLONAZEPAM 0.5 MG PO TABS: ORAL_TABLET | ORAL | 0 refills | Status: DC

## 2020-07-18 ENCOUNTER — Other Ambulatory Visit (HOSPITAL_COMMUNITY): Payer: Self-pay | Admitting: Psychiatry

## 2020-07-20 DIAGNOSIS — M810 Age-related osteoporosis without current pathological fracture: Secondary | ICD-10-CM | POA: Diagnosis not present

## 2020-07-20 DIAGNOSIS — E559 Vitamin D deficiency, unspecified: Secondary | ICD-10-CM | POA: Diagnosis not present

## 2020-07-21 ENCOUNTER — Other Ambulatory Visit (HOSPITAL_COMMUNITY): Payer: Self-pay | Admitting: Psychiatry

## 2020-07-21 DIAGNOSIS — F411 Generalized anxiety disorder: Secondary | ICD-10-CM

## 2020-07-22 ENCOUNTER — Inpatient Hospital Stay: Payer: Medicare HMO | Attending: Oncology

## 2020-07-22 ENCOUNTER — Other Ambulatory Visit: Payer: Self-pay

## 2020-07-22 ENCOUNTER — Other Ambulatory Visit (HOSPITAL_COMMUNITY): Payer: Self-pay | Admitting: *Deleted

## 2020-07-22 DIAGNOSIS — Z79899 Other long term (current) drug therapy: Secondary | ICD-10-CM | POA: Diagnosis not present

## 2020-07-22 DIAGNOSIS — E538 Deficiency of other specified B group vitamins: Secondary | ICD-10-CM | POA: Diagnosis not present

## 2020-07-22 MED ORDER — DESIPRAMINE HCL 25 MG PO TABS: 25.0000 mg | ORAL_TABLET | Freq: Every day | ORAL | 0 refills | Status: DC

## 2020-07-22 MED ORDER — CYANOCOBALAMIN 1000 MCG/ML IJ SOLN
1000.0000 ug | Freq: Once | INTRAMUSCULAR | Status: AC
  Administered 2020-07-22: 1000 ug via INTRAMUSCULAR
  Filled 2020-07-22: qty 1

## 2020-07-26 DIAGNOSIS — M810 Age-related osteoporosis without current pathological fracture: Secondary | ICD-10-CM | POA: Diagnosis not present

## 2020-08-02 ENCOUNTER — Other Ambulatory Visit: Payer: Self-pay

## 2020-08-02 MED ORDER — TIZANIDINE HCL 4 MG PO TABS: 4.0000 mg | ORAL_TABLET | Freq: Three times a day (TID) | ORAL | 1 refills | Status: DC

## 2020-08-04 ENCOUNTER — Telehealth (INDEPENDENT_AMBULATORY_CARE_PROVIDER_SITE_OTHER): Payer: Medicare HMO | Admitting: Psychiatry

## 2020-08-04 ENCOUNTER — Other Ambulatory Visit: Payer: Self-pay

## 2020-08-04 DIAGNOSIS — F332 Major depressive disorder, recurrent severe without psychotic features: Secondary | ICD-10-CM | POA: Diagnosis not present

## 2020-08-04 DIAGNOSIS — F411 Generalized anxiety disorder: Secondary | ICD-10-CM | POA: Diagnosis not present

## 2020-08-04 DIAGNOSIS — R69 Illness, unspecified: Secondary | ICD-10-CM | POA: Diagnosis not present

## 2020-08-04 DIAGNOSIS — F324 Major depressive disorder, single episode, in partial remission: Secondary | ICD-10-CM | POA: Diagnosis not present

## 2020-08-04 DIAGNOSIS — F603 Borderline personality disorder: Secondary | ICD-10-CM | POA: Diagnosis not present

## 2020-08-04 DIAGNOSIS — Z20822 Contact with and (suspected) exposure to covid-19: Secondary | ICD-10-CM | POA: Diagnosis not present

## 2020-08-04 MED ORDER — CLONAZEPAM 0.5 MG PO TABS: ORAL_TABLET | ORAL | 3 refills | Status: DC

## 2020-08-04 MED ORDER — DESIPRAMINE HCL 25 MG PO TABS: 25.0000 mg | ORAL_TABLET | Freq: Every day | ORAL | 10 refills | Status: DC

## 2020-08-04 MED ORDER — LAMOTRIGINE 150 MG PO TABS: 150.0000 mg | ORAL_TABLET | Freq: Two times a day (BID) | ORAL | 2 refills | Status: DC

## 2020-08-04 MED ORDER — VENLAFAXINE HCL 100 MG PO TABS: 100.0000 mg | ORAL_TABLET | Freq: Three times a day (TID) | ORAL | 4 refills | Status: DC

## 2020-08-04 MED ORDER — HYDROXYZINE HCL 50 MG PO TABS: 50.0000 mg | ORAL_TABLET | Freq: Three times a day (TID) | ORAL | 1 refills | Status: DC | PRN

## 2020-08-04 NOTE — Progress Notes (Signed)
`  Psychiatric Initial Adult Assessment   Patient Identification: Jamie Bennett MRN:  [id number] Date of Evaluation:  08/04/2020 Referral Source: grams per previous psychiatrist Chief Complaint:   Visit Diagnosis: borderline personality disorder   ICD-10-CM   1. Anxiety state  F41.1 venlafaxine (EFFEXOR) 100 MG tablet    hydrOXYzine (ATARAX/VISTARIL) 50 MG tablet    clonazePAM (KLONOPIN) 0.5 MG tablet    DISCONTINUED: clonazePAM (KLONOPIN) 0.5 MG tablet  2. Borderline personality disorder (HCC)  F60.3 venlafaxine (EFFEXOR) 100 MG tablet    lamoTRIgine (LAMICTAL) 150 MG tablet  3. Major depressive disorder, recurrent, severe without psychotic features (HCC)  F33.2 venlafaxine (EFFEXOR) 100 MG tablet    lamoTRIgine (LAMICTAL) 150 MG tablet    History of Present Illness:  Today the patient is seen in person.  She is actually doing well.  She still being treated for a B12 anemia.  Rheumatoid arthritis and osteoarthritis is not much better and she is going to be getting injections.  Her mood is staying.  She is sleeping and eating well.  She got good energy.  She has no problems thinking and concentrating.  She has been dealing with her mother who is declining any talking.  She went to visit her for a while which was helpful.  Patient is not irritable.  She shows no evidence of psychosis.  Financially she is stable.  She takes her medicines just as prescribed.  She is very compliant.  She has 1 son is doing fairly well.  The patient continues to function well.  She continues to drive.  Depression Symptoms:  fatigue, (Hypo) Manic Symptoms:   Anxiety Symptoms:   Psychotic Symptoms:   PTSD Symptoms:   Past Psychiatric History: 10 psychiatric hospitalizations multiple psychotropic medications presently in psychotherapy  Previous Psychotropic Medications: Yes   Substance Abuse History in the last 12 months:  Yes.    Consequences of Substance Abuse:   Past Medical History:  Past  Medical History:  Diagnosis Date  . Anemia   . Anxiety   . Bipolar 1 disorder (HCC)   . Collagen vascular disease (HCC)    rhematoid arthritis  . Constipation   . Fibromyalgia   . GERD (gastroesophageal reflux disease)   . Heart murmur    mild-asymptomatic  . Hepatic steatosis   . History of Roux-en-Y gastric bypass   . Hypotension   . Hypothyroidism   . Opioid abuse (HCC)   . Osteoarthritis   . Osteoporosis   . PONV (postoperative nausea and vomiting)    nausea only  . Rheumatic fever   . Rheumatoid arthritis (HCC)   . Thyroid disease     Past Surgical History:  Procedure Laterality Date  . CHOLECYSTECTOMY  2004  . COLONOSCOPY N/A 07/16/2017   Procedure: COLONOSCOPY;  Surgeon: Scot Jun, MD;  Location: [facility] ENDOSCOPY;  Service: Endoscopy;  Laterality: N/A;  . EXPLORATORY LAPAROTOMY  2009  . GASTRIC BYPASS  2003   [facility]  . LAPAROTOMY N/A 03/01/2018   Procedure: EXPLORATORY LAPAROTOMY/ UMBILECTOMY;  Surgeon: Earline Mayotte, MD;  Location: [facility] ORS;  Service: General;  Laterality: N/A;  . TONSILLECTOMY    . VENTRAL HERNIA REPAIR N/A 03/07/2018   Procedure: HERNIA REPAIR VENTRAL ADULT;  Surgeon: Earline Mayotte, MD;  Location: [facility] ORS;  Service: General;  Laterality: N/A;    Family Psychiatric History:   Family History:  Family History  Problem Relation Age of Onset  . Depression Mother   . Dementia Mother   .  Heart disease Mother   . Osteoporosis Mother   . Parkinson's disease Father   . Colon cancer Neg Hx     Social History:   Social History   Socioeconomic History  . Marital status: Married    Spouse name: Not on file  . Number of children: Not on file  . Years of education: Not on file  . Highest education level: Not on file  Occupational History  . Not on file  Tobacco Use  . Smoking status: Never Smoker  . Smokeless tobacco: Never Used  Vaping Use  . Vaping Use: Never used  Substance and Sexual Activity  . Alcohol use: No     Alcohol/week: 0.0 standard drinks  . Drug use: No  . Sexual activity: Yes  Other Topics Concern  . Not on file  Social History Narrative  . Not on file   Social Determinants of Health   Financial Resource Strain: Not on file  Food Insecurity: Not on file  Transportation Needs: Not on file  Physical Activity: Not on file  Stress: Not on file  Social Connections: Not on file    Additional Social History:   Allergies:   Allergies  Allergen Reactions  . Lactose Intolerance (Gi) Other (See Comments)    Bloating and GI distress  . Sulfa Antibiotics Hives    Metabolic Disorder Labs: No results found for: HGBA1C, MPG No results found for: PROLACTIN Lab Results  Component Value Date   CHOL 169 05/26/2020   TRIG 136 05/26/2020   HDL 73 05/26/2020   LDLCALC 73 05/26/2020     Current Medications: Current Outpatient Medications  Medication Sig Dispense Refill  . albuterol (VENTOLIN HFA) 108 (90 Base) MCG/ACT inhaler Inhale 2 puffs into the lungs every 6 (six) hours as needed for wheezing or shortness of breath. 1 Inhaler 3  . aspirin EC 81 MG tablet Take 81 mg by mouth daily.    . busPIRone (BUSPAR) 30 MG tablet 1 bid 180 tablet 1  . Cholecalciferol (HM VITAMIN D3) 4000 units CAPS Take 4,000 Units by mouth daily.     . ciprofloxacin (CIPRO) 500 MG tablet Take 1 tablet (500 mg total) by mouth 2 (two) times daily. (Patient not taking: Reported on 07/01/2020) 20 tablet 0  . clonazePAM (KLONOPIN) 0.5 MG tablet TAKE 1 TABLET BY MOUTH ONCE DAILY AS NEEDED FOR  ACUTE  ANXIETY  EMERGENCIES  ONLY  DO  NOT  EXCEED  ONE  PER  DAY 30 tablet 3  . Coenzyme Q-10 200 MG CAPS Take 200 mg by mouth 2 (two) times daily.     . cyanocobalamin (,VITAMIN B-12,) 1000 MCG/ML injection Inject 1,000 mcg into the muscle every 30 (thirty) days.     Marland Kitchen denosumab (PROLIA) 60 MG/ML SOSY injection Inject 60 mg into the skin every 6 (six) months.    . desipramine (NORPRAMIN) 25 MG tablet Take 1 tablet (25 mg  total) by mouth daily. 30 tablet 10  . esomeprazole (NEXIUM) 20 MG capsule Take 20 mg by mouth daily.     . Fiber, Guar Gum, CHEW Chew 5 mg by mouth daily.     . hydroxychloroquine (PLAQUENIL) 200 MG tablet Take 1 tablet (200 mg total) by mouth 2 (two) times daily. 180 tablet 0  . hydrOXYzine (ATARAX/VISTARIL) 50 MG tablet Take 1 tablet (50 mg total) by mouth 3 (three) times daily as needed for anxiety. 270 tablet 1  . ibuprofen (ADVIL,MOTRIN) 100 MG tablet Take 100 mg by mouth  as needed for fever.    Javier Docker Oil 500 MG CAPS Take 500 mg by mouth daily.    Marland Kitchen lamoTRIgine (LAMICTAL) 150 MG tablet Take 1 tablet (150 mg total) by mouth 2 (two) times daily. 180 tablet 2  . levothyroxine (SYNTHROID) 112 MCG tablet Take 1 tablet (112 mcg total) by mouth daily. 90 tablet 1  . loratadine (CLARITIN) 10 MG tablet Take 10 mg by mouth daily as needed for allergies.    . polyethylene glycol (MIRALAX) packet Take 17 g by mouth daily. (Patient taking differently: Take 17 g by mouth as needed. ) 14 each 0  . sucralfate (CARAFATE) 1 g tablet Take 1 tablet (1 g total) by mouth 2 (two) times daily. 180 tablet 3  . tiZANidine (ZANAFLEX) 4 MG tablet Take 1 tablet (4 mg total) by mouth 3 (three) times daily. 90 tablet 1  . traZODone (DESYREL) 100 MG tablet Take 1 tablet (100 mg total) by mouth at bedtime as needed and may repeat dose one time if needed for sleep. 60 tablet 5  . venlafaxine (EFFEXOR) 100 MG tablet Take 1 tablet (100 mg total) by mouth 3 (three) times daily with meals. 90 tablet 4   No current facility-administered medications for this visit.    Neurologic: Headache: No Seizure: No Paresthesias:NA  Musculoskeletal: Strength & Muscle Tone: within normal limits Gait & Station: normal Patient leans: N/A  Psychiatric Specialty Exam: ROS  There were no vitals taken for this visit.There is no height or weight on file to calculate BMI.  General Appearance: Bizarre  Eye Contact:  Good  Speech:   Normal Rate  Volume:  Normal  Mood:  Anxious  Affect:  Congruent  Thought Process:  Goal Directed  Orientation:  NA  Thought Content:  Logical  Suicidal Thoughts:  No  Homicidal Thoughts:  No  Memory:  Negative  Judgement:  Fair  Insight:  NA and Good  Psychomotor Activity:  Normal  Concentration:    Recall:  Good  Fund of Knowledge:  Language: Good  Akathisia:  No  Handed:  Right  AIMS (if indicated):    Assets:  Desire for Improvement  ADL's:  Intact  Cognition: WNL  Sleep:      Treatment Plan Summary:  This patient's first problem is that of major depression.  She will continue taking Effexor 100 mg immediate release 3 times daily.  She also takes desipramine 25 mg once a day.  The combination of these 2 medicines worked very well.  Presently the patient is not in therapy.  The patient has been training with DBT for her personality disorder.  Patient seems to be functioning extremely well.  Her second problem is an adjustment disorder with an anxious mood state.  She will continue taking Klonopin 0.5 mg on a as needed basis.  The patient also takes Vistaril 50 mg during the day.  Her second problem is that of borderline personality disorder in addition to DBT treatment she also takes Lamictal.  Her mood is very stable.  She will return to see me in a few months. Jerral Ralph, MD 1/5/20222:37 PM

## 2020-08-06 NOTE — Telephone Encounter (Signed)
Submitted for VOB, and faxed PA request to [facility] 08/06/2020.

## 2020-08-11 ENCOUNTER — Telehealth (HOSPITAL_COMMUNITY): Payer: Self-pay | Admitting: *Deleted

## 2020-08-11 NOTE — Telephone Encounter (Signed)
Pt has called several times in the last several days requesting refill of the Klonopin. Writer spoke with pharmacist at [facility] who says that it is too early to fill as she filled it last on 08/04/20. Pharmacist stated that she has advised pt of this as well. Writer returned pt called and advised that pharmacy will not refill so early. Pt verbalized understanding however is calling today with same questions. FYI.

## 2020-08-18 ENCOUNTER — Telehealth (HOSPITAL_COMMUNITY): Payer: Self-pay | Admitting: *Deleted

## 2020-08-18 NOTE — Telephone Encounter (Signed)
Pt continues to call regarding refill of the Klonopin. [facility] states that Rx was filled on 08/04/20 so it's too early. Pt has been advised of this.FYI.

## 2020-08-19 NOTE — Telephone Encounter (Signed)
Please call to schedule Visco knee injections, US guided with Dr. Estanislado Pandy.  Authorized for [employer] series Bilateral Knees. [employer] and [employer] does not apply  PA for Orthovisc thru [employer]. Authorization # [id number]  Effective 08/06/2020 - 11/04/2020 Patient to pay 20% of allowable cost, plus $25.00 copay each visit. Once OOP is met insurance covers 100% of allowable cost. (as of date $0 has been met of $4500. )

## 2020-08-23 ENCOUNTER — Other Ambulatory Visit: Payer: Self-pay | Admitting: Rheumatology

## 2020-08-23 DIAGNOSIS — M0579 Rheumatoid arthritis with rheumatoid factor of multiple sites without organ or systems involvement: Secondary | ICD-10-CM

## 2020-08-23 MED ORDER — HYDROXYCHLOROQUINE SULFATE 200 MG PO TABS: 200.0000 mg | ORAL_TABLET | Freq: Two times a day (BID) | ORAL | 0 refills | Status: DC

## 2020-08-23 NOTE — Telephone Encounter (Signed)
Patient calling to request a refill on Plaquenil sent to [facility] on [address]. Patient states directions were changed when she was last seen to 1 tab BID everyday. Patient does request 3 months supply due to cost, if possible.

## 2020-08-23 NOTE — Telephone Encounter (Signed)
Last Visit: 07/01/2020 Next Visit: 09/29/2020 Labs: 05/26/2020 CBC/CMP WNL Eye exam: 07/22/2019 WNL  Current Dose per office note 07/01/2020: Plaquenil 200 mg 1 tablet by mouth twice daily. DX: Rheumatoid arthritis involving multiple sites with positive rheumatoid factor   Okay to refill Plaquenil?

## 2020-08-23 NOTE — Telephone Encounter (Signed)
Patient advised she is due for a PLQ eye exam. Patient states she had it scheduled for early February 2022 and will send in results.

## 2020-08-23 NOTE — Addendum Note (Signed)
Addended by: Carole Binning on: 08/23/2020 03:28 PM   Modules accepted: Orders

## 2020-08-23 NOTE — Telephone Encounter (Signed)
Please advise the patient to update PLQ eye exam. Ok to refill PLQ.  

## 2020-08-23 NOTE — Telephone Encounter (Signed)
Jamie Bennett called patient, patient did not pick up RX at [facility] on 07/01/2020

## 2020-08-25 ENCOUNTER — Telehealth: Payer: Self-pay | Admitting: Rheumatology

## 2020-08-25 NOTE — Telephone Encounter (Signed)
Opened in error

## 2020-08-26 ENCOUNTER — Inpatient Hospital Stay: Payer: Medicare HMO | Attending: Oncology

## 2020-08-26 DIAGNOSIS — Z79899 Other long term (current) drug therapy: Secondary | ICD-10-CM | POA: Insufficient documentation

## 2020-08-26 DIAGNOSIS — E538 Deficiency of other specified B group vitamins: Secondary | ICD-10-CM | POA: Insufficient documentation

## 2020-08-26 MED ORDER — CYANOCOBALAMIN 1000 MCG/ML IJ SOLN
1000.0000 ug | Freq: Once | INTRAMUSCULAR | Status: AC
  Administered 2020-08-26: 1000 ug via INTRAMUSCULAR
  Filled 2020-08-26: qty 1

## 2020-08-30 ENCOUNTER — Other Ambulatory Visit: Payer: Self-pay

## 2020-08-30 DIAGNOSIS — K269 Duodenal ulcer, unspecified as acute or chronic, without hemorrhage or perforation: Secondary | ICD-10-CM

## 2020-08-30 MED ORDER — SUCRALFATE 1 G PO TABS: 1.0000 g | ORAL_TABLET | Freq: Two times a day (BID) | ORAL | 3 refills | Status: DC

## 2020-08-30 MED ORDER — SUCRALFATE 1 G PO TABS: 1.0000 g | ORAL_TABLET | Freq: Two times a day (BID) | ORAL | 0 refills | Status: DC

## 2020-09-08 ENCOUNTER — Encounter: Payer: Self-pay | Admitting: Rheumatology

## 2020-09-08 DIAGNOSIS — H524 Presbyopia: Secondary | ICD-10-CM | POA: Diagnosis not present

## 2020-09-08 DIAGNOSIS — M810 Age-related osteoporosis without current pathological fracture: Secondary | ICD-10-CM | POA: Diagnosis not present

## 2020-09-08 DIAGNOSIS — Z79899 Other long term (current) drug therapy: Secondary | ICD-10-CM | POA: Diagnosis not present

## 2020-09-13 DIAGNOSIS — R0781 Pleurodynia: Secondary | ICD-10-CM | POA: Diagnosis not present

## 2020-09-13 DIAGNOSIS — S299XXA Unspecified injury of thorax, initial encounter: Secondary | ICD-10-CM | POA: Diagnosis not present

## 2020-09-14 ENCOUNTER — Telehealth: Payer: Self-pay

## 2020-09-14 DIAGNOSIS — G4700 Insomnia, unspecified: Secondary | ICD-10-CM | POA: Diagnosis not present

## 2020-09-14 DIAGNOSIS — E039 Hypothyroidism, unspecified: Secondary | ICD-10-CM | POA: Diagnosis not present

## 2020-09-14 DIAGNOSIS — M1990 Unspecified osteoarthritis, unspecified site: Secondary | ICD-10-CM | POA: Diagnosis not present

## 2020-09-14 DIAGNOSIS — R69 Illness, unspecified: Secondary | ICD-10-CM | POA: Diagnosis not present

## 2020-09-14 DIAGNOSIS — M545 Low back pain, unspecified: Secondary | ICD-10-CM | POA: Diagnosis not present

## 2020-09-14 DIAGNOSIS — G8911 Acute pain due to trauma: Secondary | ICD-10-CM | POA: Diagnosis not present

## 2020-09-14 DIAGNOSIS — M069 Rheumatoid arthritis, unspecified: Secondary | ICD-10-CM | POA: Diagnosis not present

## 2020-09-14 DIAGNOSIS — G8929 Other chronic pain: Secondary | ICD-10-CM | POA: Diagnosis not present

## 2020-09-14 DIAGNOSIS — K219 Gastro-esophageal reflux disease without esophagitis: Secondary | ICD-10-CM | POA: Diagnosis not present

## 2020-09-14 DIAGNOSIS — F411 Generalized anxiety disorder: Secondary | ICD-10-CM | POA: Diagnosis not present

## 2020-09-14 NOTE — Telephone Encounter (Signed)
Mailed patient records to [facility] on 09/14/2020 at [address] [place] [address] 

## 2020-09-20 NOTE — Progress Notes (Signed)
° °  Procedure Note  Patient: Jamie Bennett             Date of Birth: 04/05/1961           MRN: [id number]             Visit Date: 09/21/2020  Procedures: Visit Diagnoses:  1. Primary osteoarthritis of both knees    This patient is diagnosed with osteoarthritis of the knee(s).    Radiographs show evidence of joint space narrowing, osteophytes, subchondral sclerosis and/or subchondral cysts.  This patient has knee pain which interferes with functional and activities of daily living.    This patient has experienced inadequate response, adverse effects and/or intolerance with conservative treatments such as acetaminophen, NSAIDS, topical creams, physical therapy or regular exercise, knee bracing and/or weight loss.   This patient has experienced inadequate response or has a contraindication to intra articular steroid injections for at least 3 months.   This patient is not scheduled to have a total knee replacement within 6 months of starting treatment with viscosupplementation.  Ultrasound guided injection is preferred based studies that show increased duration, increased effect, greater accuracy, decreased procedural pain, increased response rate, and decreased cost with ultrasound guided versus blind injection.  Ultrasound-guided injection is also the preferred choice in her case due to high BMI.  Verbal informed consent obtained.  Time-out conducted.  Noted no overlying erythema, induration, or other signs of local infection. Ultrasound-guided bilateral knee joint injections:  After sterile prep with Betadine, 1-1/2 mL of 1% lidocaine and Orthovisc was injected using a 22-gauge needle, the needle was guided into the region of suprapatellar bursa.  Large Joint Inj: bilateral knee on 09/21/2020 3:08 PM Indications: pain Details: 25 G 1.5 in needle, ultrasound-guided medial approach  Arthrogram: No  Medications (Right): 1.5 mL lidocaine 1 %; 30 mg Hyaluronan 30 MG/2ML Aspirate (Right):  0 mL Medications (Left): 1.5 mL lidocaine 1 %; 30 mg Hyaluronan 30 MG/2ML Aspirate (Left): 0 mL Outcome: tolerated well, no immediate complications Procedure, treatment alternatives, risks and benefits explained, specific risks discussed. Consent was given by the patient. Immediately prior to procedure a time out was called to verify the correct patient, procedure, equipment, support staff and site/side marked as required. Patient was prepped and draped in the usual sterile fashion.     Postprocedural instructions were given.  Bo Merino, MD

## 2020-09-21 ENCOUNTER — Ambulatory Visit: Payer: Self-pay

## 2020-09-21 ENCOUNTER — Encounter: Payer: Self-pay | Admitting: Hospice and Palliative Medicine

## 2020-09-21 ENCOUNTER — Other Ambulatory Visit: Payer: Self-pay

## 2020-09-21 ENCOUNTER — Ambulatory Visit (INDEPENDENT_AMBULATORY_CARE_PROVIDER_SITE_OTHER): Payer: Medicare HMO | Admitting: Hospice and Palliative Medicine

## 2020-09-21 ENCOUNTER — Ambulatory Visit (INDEPENDENT_AMBULATORY_CARE_PROVIDER_SITE_OTHER): Payer: Medicare HMO | Admitting: Rheumatology

## 2020-09-21 VITALS — BP 132/78 | HR 77 | Temp 97.5°F | Resp 16 | Ht 66.0 in | Wt 346.6 lb

## 2020-09-21 DIAGNOSIS — F339 Major depressive disorder, recurrent, unspecified: Secondary | ICD-10-CM | POA: Diagnosis not present

## 2020-09-21 DIAGNOSIS — M170 Bilateral primary osteoarthritis of knee: Secondary | ICD-10-CM

## 2020-09-21 DIAGNOSIS — S2242XS Multiple fractures of ribs, left side, sequela: Secondary | ICD-10-CM

## 2020-09-21 DIAGNOSIS — R69 Illness, unspecified: Secondary | ICD-10-CM | POA: Diagnosis not present

## 2020-09-21 DIAGNOSIS — Z1231 Encounter for screening mammogram for malignant neoplasm of breast: Secondary | ICD-10-CM | POA: Diagnosis not present

## 2020-09-21 MED ORDER — HYALURONAN 30 MG/2ML IX SOSY
30.0000 mg | PREFILLED_SYRINGE | INTRA_ARTICULAR | Status: AC | PRN
  Administered 2020-09-21: 30 mg via INTRA_ARTICULAR

## 2020-09-21 MED ORDER — LIDOCAINE HCL 1 % IJ SOLN
1.5000 mL | INTRAMUSCULAR | Status: AC | PRN
Start: 2020-09-21 — End: 2020-09-21
  Administered 2020-09-21: 1.5 mL

## 2020-09-21 MED ORDER — HYDROCODONE-ACETAMINOPHEN 5-300 MG PO TABS
1.0000 | ORAL_TABLET | Freq: Every evening | ORAL | 0 refills | Status: DC | PRN
Start: 2020-09-21 — End: 2020-09-21

## 2020-09-21 MED ORDER — HYDROCODONE-ACETAMINOPHEN 5-325 MG PO TABS: 1.0000 | ORAL_TABLET | Freq: Every evening | ORAL | 0 refills | Status: DC | PRN

## 2020-09-21 MED ORDER — LIDOCAINE HCL 1 % IJ SOLN
1.5000 mL | INTRAMUSCULAR | Status: AC | PRN
  Administered 2020-09-21: 1.5 mL

## 2020-09-21 NOTE — Progress Notes (Signed)
[facility], [place] [address]  Internal MEDICINE  Office Visit Note  Patient Name: Jamie Bennett  [id number]  [id number]  Date of Service: 09/21/2020  Chief Complaint  Patient presents with  . Hospitalization Follow-up    [facility] two weeks ago and cracked a rib   . Gastroesophageal Reflux  . Quality Metric Gaps    flu    HPI Patient is here for routine follow-up Seen recently by urgent care 2/14 after a fall which ultimately resulted in left rib fractures Continues to complain of ongoing left sided rib pain--was instructed by urgent care to frequently take deep breaths to avoid further complications Was given short course narcotics, requesting refill as her pain is still present and affecting her sleep Mammogram scheduled for later today Followed by multiple providers and specialists Endocrinology/rheumatollogy for osteoporosis/RA--on Prolia innjections Psychiatry for MDD and borderline personality disorder Hematology for iron and B12 deficiency anemia History of gastric bypass  Current Medication: Outpatient Encounter Medications as of 09/21/2020  Medication Sig  . albuterol (VENTOLIN HFA) 108 (90 Base) MCG/ACT inhaler Inhale 2 puffs into the lungs every 6 (six) hours as needed for wheezing or shortness of breath.  Marland Kitchen aspirin EC 81 MG tablet Take 81 mg by mouth daily.  . busPIRone (BUSPAR) 30 MG tablet 1 bid  . Cholecalciferol 100 MCG (4000 UT) CAPS Take 4,000 Units by mouth daily.   . clonazePAM (KLONOPIN) 0.5 MG tablet TAKE 1 TABLET BY MOUTH ONCE DAILY AS NEEDED FOR  ACUTE  ANXIETY  EMERGENCIES  ONLY  DO  NOT  EXCEED  ONE  PER  DAY  . Coenzyme Q-10 200 MG CAPS Take 200 mg by mouth 2 (two) times daily.   . cyanocobalamin (,VITAMIN B-12,) 1000 MCG/ML injection Inject 1,000 mcg into the muscle every 30 (thirty) days.   Marland Kitchen denosumab (PROLIA) 60 MG/ML SOSY injection Inject 60 mg into the skin every 6 (six) months.  . desipramine (NORPRAMIN)  25 MG tablet Take 1 tablet (25 mg total) by mouth daily.  Marland Kitchen esomeprazole (NEXIUM) 20 MG capsule Take 20 mg by mouth daily.   . Fiber, Guar Gum, CHEW Chew 5 mg by mouth daily.   Marland Kitchen HYDROcodone-Acetaminophen 5-300 MG TABS Take 1 tablet by mouth at bedtime as needed.  . hydroxychloroquine (PLAQUENIL) 200 MG tablet Take 1 tablet (200 mg total) by mouth 2 (two) times daily.  . hydrOXYzine (ATARAX/VISTARIL) 50 MG tablet Take 1 tablet (50 mg total) by mouth 3 (three) times daily as needed for anxiety.  Marland Kitchen ibuprofen (ADVIL,MOTRIN) 100 MG tablet Take 100 mg by mouth as needed for fever.  Javier Docker Oil 500 MG CAPS Take 500 mg by mouth daily.  Marland Kitchen lamoTRIgine (LAMICTAL) 150 MG tablet Take 1 tablet (150 mg total) by mouth 2 (two) times daily.  Marland Kitchen levothyroxine (SYNTHROID) 112 MCG tablet Take 1 tablet (112 mcg total) by mouth daily.  Marland Kitchen loratadine (CLARITIN) 10 MG tablet Take 10 mg by mouth daily as needed for allergies.  . polyethylene glycol (MIRALAX) packet Take 17 g by mouth daily. (Patient taking differently: Take 17 g by mouth as needed.)  . sucralfate (CARAFATE) 1 g tablet Take 1 tablet (1 g total) by mouth 2 (two) times daily.  Marland Kitchen tiZANidine (ZANAFLEX) 4 MG tablet Take 1 tablet (4 mg total) by mouth 3 (three) times daily.  . traZODone (DESYREL) 100 MG tablet Take 1 tablet (100 mg total) by mouth at bedtime as needed and may repeat dose  one time if needed for sleep.  Marland Kitchen venlafaxine (EFFEXOR) 100 MG tablet Take 1 tablet (100 mg total) by mouth 3 (three) times daily with meals.  . [DISCONTINUED] ciprofloxacin (CIPRO) 500 MG tablet Take 1 tablet (500 mg total) by mouth 2 (two) times daily. (Patient not taking: Reported on 07/01/2020)   No facility-administered encounter medications on file as of 09/21/2020.    Surgical History: Past Surgical History:  Procedure Laterality Date  . CHOLECYSTECTOMY  2004  . COLONOSCOPY N/A 07/16/2017   Procedure: COLONOSCOPY;  Surgeon: Manya Silvas, MD;  Location: [facility]  ENDOSCOPY;  Service: Endoscopy;  Laterality: N/A;  . EXPLORATORY LAPAROTOMY  2009  . GASTRIC BYPASS  2003   [place] N/A 03/01/2018   Procedure: EXPLORATORY LAPAROTOMY/ UMBILECTOMY;  Surgeon: Robert Bellow, MD;  Location: [facility] ORS;  Service: General;  Laterality: N/A;  . TONSILLECTOMY    . VENTRAL HERNIA REPAIR N/A 03/07/2018   Procedure: HERNIA REPAIR VENTRAL ADULT;  Surgeon: Robert Bellow, MD;  Location: [facility] ORS;  Service: General;  Laterality: N/A;    Medical History: Past Medical History:  Diagnosis Date  . Anemia   . Anxiety   . Bipolar 1 disorder ([place])   . Collagen vascular disease ([place])    rhematoid arthritis  . Constipation   . Fibromyalgia   . GERD (gastroesophageal reflux disease)   . Heart murmur    mild-asymptomatic  . Hepatic steatosis   . History of Roux-en-Y gastric bypass   . Hypotension   . Hypothyroidism   . Opioid abuse ([place])   . Osteoarthritis   . Osteoporosis   . PONV (postoperative nausea and vomiting)    nausea only  . Rheumatic fever   . Rheumatoid arthritis ([place])   . Thyroid disease     Family History: Family History  Problem Relation Age of Onset  . Depression Mother   . Dementia Mother   . Heart disease Mother   . Osteoporosis Mother   . Parkinson's disease Father   . Colon cancer Neg Hx     Social History   Socioeconomic History  . Marital status: Married    Spouse name: Not on file  . Number of children: Not on file  . Years of education: Not on file  . Highest education level: Not on file  Occupational History  . Not on file  Tobacco Use  . Smoking status: Never Smoker  . Smokeless tobacco: Never Used  Vaping Use  . Vaping Use: Never used  Substance and Sexual Activity  . Alcohol use: No    Alcohol/week: 0.0 standard drinks  . Drug use: No  . Sexual activity: Yes  Other Topics Concern  . Not on file  Social History Narrative  . Not on file   Social Determinants of Health   Financial  Resource Strain: Not on file  Food Insecurity: Not on file  Transportation Needs: Not on file  Physical Activity: Not on file  Stress: Not on file  Social Connections: Not on file  Intimate Partner Violence: Not on file      Review of Systems  Constitutional: Negative for chills, diaphoresis and fatigue.  HENT: Negative for ear pain, postnasal drip and sinus pressure.   Eyes: Negative for photophobia, discharge, redness, itching and visual disturbance.  Respiratory: Negative for cough, shortness of breath and wheezing.   Cardiovascular: Negative for chest pain, palpitations and leg swelling.  Gastrointestinal: Negative for abdominal pain, constipation, diarrhea, nausea and vomiting.  Genitourinary: Negative for dysuria and flank pain.  Musculoskeletal: Negative for arthralgias, back pain, gait problem and neck pain.       Left sided rib pain due to fractures  Skin: Negative for color change.  Allergic/Immunologic: Negative for environmental allergies and food allergies.  Neurological: Negative for dizziness and headaches.  Hematological: Does not bruise/bleed easily.  Psychiatric/Behavioral: Negative for agitation, behavioral problems (depression) and hallucinations.    Vital Signs: BP 132/78   Pulse 77   Temp (!) 97.5 F (36.4 C)   Resp 16   Ht 5\' 6"  (1.676 m)   Wt (!) 346 lb 9.6 oz (157.2 kg)   SpO2 99%   BMI 55.94 kg/m    Physical Exam Vitals reviewed.  Constitutional:      Appearance: Normal appearance. She is obese.  Cardiovascular:     Rate and Rhythm: Normal rate and regular rhythm.     Pulses: Normal pulses.     Heart sounds: Normal heart sounds.  Pulmonary:     Effort: Pulmonary effort is normal.     Breath sounds: Normal breath sounds.  Musculoskeletal:     Cervical back: Normal range of motion.     Comments: Obvious discomfort to left rib area with deep breathing and positional changes  Skin:    General: Skin is warm.  Neurological:     General: No  focal deficit present.     Mental Status: She is alert and oriented to person, place, and time. Mental status is at baseline.  Psychiatric:        Mood and Affect: Mood normal.        Behavior: Behavior normal.        Thought Content: Thought content normal.        Judgment: Judgment normal.    Assessment/Plan: 1. Closed fracture of multiple ribs of left side, sequela 10 count of Norco sent to pharmacy Discussed to continue with deep breathing and coughing exercises Will need to control further pain with acetaminophen and heat/ice therapy [place] Controlled Substance Database was reviewed by me for overdose risk score (ORS)  2. Morbid obesity (HCC) BMI 55 Obesity Counseling: Risk Assessment: An assessment of behavioral risk factors was made today and includes lack of exercise sedentary lifestyle, lack of portion control and poor dietary habits.  Risk Modification Advice: She was counseled on portion control guidelines. Restricting daily caloric intake to 1800. The detrimental long term effects of obesity on her health and ongoing poor compliance was also discussed with the patient.  3. Recurrent major depressive disorder, remission status unspecified ([place]) Followed by psychiatry  4. Primary osteoarthritis of both knees Followed by rheumatology as well as endocrinology  General Counseling: Isaiah Blakes understanding of the findings of todays visit and agrees with plan of treatment. I have discussed any further diagnostic evaluation that may be needed or ordered today. We also reviewed her medications today. she has been encouraged to call the office with any questions or concerns that should arise related to todays visit.   Meds ordered this encounter  Medications  . HYDROcodone-Acetaminophen 5-300 MG TABS    Sig: Take 1 tablet by mouth at bedtime as needed.    Dispense:  30 tablet    Refill:  0    Time spent: 30 Minutes Time spent includes review of chart, medications, test  results and follow-up plan with the patient.  This patient was seen by Theodoro Grist AGNP-C in Collaboration with Dr Lavera Guise as a part of  collaborative care agreement     Tanna Furry. Dionisio Aragones AGNP-C Internal medicine

## 2020-09-22 ENCOUNTER — Encounter: Payer: Self-pay | Admitting: Hospice and Palliative Medicine

## 2020-09-23 ENCOUNTER — Inpatient Hospital Stay: Payer: Medicare HMO | Attending: Oncology

## 2020-09-23 ENCOUNTER — Inpatient Hospital Stay: Payer: Medicare HMO

## 2020-09-23 DIAGNOSIS — D538 Other specified nutritional anemias: Secondary | ICD-10-CM | POA: Diagnosis not present

## 2020-09-23 DIAGNOSIS — K911 Postgastric surgery syndromes: Secondary | ICD-10-CM | POA: Diagnosis not present

## 2020-09-23 DIAGNOSIS — E669 Obesity, unspecified: Secondary | ICD-10-CM | POA: Insufficient documentation

## 2020-09-23 DIAGNOSIS — Z9884 Bariatric surgery status: Secondary | ICD-10-CM | POA: Diagnosis not present

## 2020-09-23 DIAGNOSIS — D509 Iron deficiency anemia, unspecified: Secondary | ICD-10-CM | POA: Insufficient documentation

## 2020-09-23 DIAGNOSIS — Z79899 Other long term (current) drug therapy: Secondary | ICD-10-CM | POA: Insufficient documentation

## 2020-09-23 DIAGNOSIS — E538 Deficiency of other specified B group vitamins: Secondary | ICD-10-CM

## 2020-09-23 LAB — CBC WITH DIFFERENTIAL/PLATELET
Abs Immature Granulocytes: 0.02 10*3/uL (ref 0.00–0.07)
Basophils Absolute: 0 10*3/uL (ref 0.0–0.1)
Basophils Relative: 1 %
Eosinophils Absolute: 0.3 10*3/uL (ref 0.0–0.5)
Eosinophils Relative: 6 %
HCT: 36.6 % (ref 36.0–46.0)
Hemoglobin: 11.7 g/dL — ABNORMAL LOW (ref 12.0–15.0)
Immature Granulocytes: 0 %
Lymphocytes Relative: 34 %
Lymphs Abs: 1.7 10*3/uL (ref 0.7–4.0)
MCH: 29.8 pg (ref 26.0–34.0)
MCHC: 32 g/dL (ref 30.0–36.0)
MCV: 93.4 fL (ref 80.0–100.0)
Monocytes Absolute: 0.5 10*3/uL (ref 0.1–1.0)
Monocytes Relative: 9 %
Neutro Abs: 2.5 10*3/uL (ref 1.7–7.7)
Neutrophils Relative %: 50 %
Platelets: 203 10*3/uL (ref 150–400)
RBC: 3.92 MIL/uL (ref 3.87–5.11)
RDW: 13.2 % (ref 11.5–15.5)
WBC: 5 10*3/uL (ref 4.0–10.5)
nRBC: 0 % (ref 0.0–0.2)

## 2020-09-23 LAB — IRON AND TIBC
Iron: 89 ug/dL (ref 28–170)
Saturation Ratios: 22 % (ref 10.4–31.8)
TIBC: 398 ug/dL (ref 250–450)
UIBC: 309 ug/dL

## 2020-09-23 LAB — FERRITIN: Ferritin: 75 ng/mL (ref 11–307)

## 2020-09-23 MED ORDER — CYANOCOBALAMIN 1000 MCG/ML IJ SOLN
1000.0000 ug | Freq: Once | INTRAMUSCULAR | Status: AC
  Administered 2020-09-23: 1000 ug via INTRAMUSCULAR
  Filled 2020-09-23: qty 1

## 2020-09-27 NOTE — Progress Notes (Signed)
   Procedure Note  Patient: Jamie Bennett             Date of Birth: [birth year]           MRN: [id number]             Visit Date: 09/29/2020  Procedures: Visit Diagnoses:  1. Primary osteoarthritis of both knees    Orthovisc #2 bilateral knees, B/B  Large Joint Inj: bilateral knee on 09/29/2020 2:13 PM Indications: pain Details: 25 G 1.5 in needle, ultrasound-guided medial approach  Arthrogram: No  Medications (Right): 1.5 mL lidocaine 1 %; 30 mg Hyaluronan 30 MG/2ML Aspirate (Right): 0 mL Medications (Left): 1.5 mL lidocaine 1 %; 30 mg Hyaluronan 30 MG/2ML Aspirate (Left): 0 mL Outcome: tolerated well, no immediate complications Procedure, treatment alternatives, risks and benefits explained, specific risks discussed. Consent was given by the patient. Immediately prior to procedure a time out was called to verify the correct patient, procedure, equipment, support staff and site/side marked as required. Patient was prepped and draped in the usual sterile fashion.     Ultrasound guided injection is preferred based studies that show increased  duration, increased effect, greater accuracy, decreased procedural pain,  increased response rate, and decreased cost with ultrasound guided versus  blind injection. Ultrasound-guided injection is also the preferred choice  in her case due to high BMI. Verbal informed consent obtained. Time-out  conducted. Noted no overlying erythema, induration, or other signs of  local infection. Ultrasound-guided bilateral knee joint injection: After sterile  prep with Betadine, 1-1/2 mL of 1% lidocaine and Orthovisc was injected  using a 22-gauge needle, the needle was guided into the region of  suprapatellar bursa bilaterally.  Patient tolerated the procedure well.  Postprocedure instructions were given.  Bo Merino, MD

## 2020-09-27 NOTE — Progress Notes (Signed)
   Procedure Note  Patient: Jamie Bennett             Date of Birth: Jun 18, 1961           MRN: [id number]             Visit Date: 10/06/2020  Procedures: Visit Diagnoses:  1. Primary osteoarthritis of both knees    Orthovisc #3 bilateral knees, B/B Large Joint Inj: bilateral knee on 10/06/2020 4:00 PM Indications: pain Details: 22 G 1.5 in needle, ultrasound-guided medial approach  Arthrogram: No  Medications (Right): 1.5 mL lidocaine 1 %; 30 mg Hyaluronan 30 MG/2ML Aspirate (Right): 0 mL Medications (Left): 1.5 mL lidocaine 1 %; 30 mg Hyaluronan 30 MG/2ML Aspirate (Left): 0 mL Outcome: tolerated well, no immediate complications Procedure, treatment alternatives, risks and benefits explained, specific risks discussed. Consent was given by the patient. Immediately prior to procedure a time out was called to verify the correct patient, procedure, equipment, support staff and site/side marked as required. Patient was prepped and draped in the usual sterile fashion.      Ultrasound guided injection is preferred based studies that show increased  duration, increased effect, greater accuracy, decreased procedural pain,  increased response rate, and decreased cost with ultrasound guided versus  blind injection. Ultrasound-guided injection is also the preferred choice  in her case due to high BMI. Verbal informed consent obtained. Time-out  conducted. Noted no overlying erythema, induration, or other signs of  local infection. Ultrasound-guided bilateral knee joint injection: After sterile  prep with Betadine, 1-1/2 mL of 1% lidocaine and Orthovisc was injected  using a 22-gauge needle, the needle was guided into the region of  suprapatellar bursa bilaterally.  Patient tolerated procedure well.  Postprocedure instructions were discussed.  Bo Merino, MD

## 2020-09-29 ENCOUNTER — Telehealth: Payer: Self-pay

## 2020-09-29 ENCOUNTER — Other Ambulatory Visit: Payer: Self-pay

## 2020-09-29 ENCOUNTER — Ambulatory Visit: Payer: Self-pay

## 2020-09-29 ENCOUNTER — Ambulatory Visit: Payer: Medicare HMO | Admitting: Rheumatology

## 2020-09-29 DIAGNOSIS — M170 Bilateral primary osteoarthritis of knee: Secondary | ICD-10-CM | POA: Diagnosis not present

## 2020-09-29 MED ORDER — LIDOCAINE HCL 1 % IJ SOLN
1.5000 mL | INTRAMUSCULAR | Status: AC | PRN
  Administered 2020-09-29: 1.5 mL

## 2020-09-29 MED ORDER — HYALURONAN 30 MG/2ML IX SOSY
30.0000 mg | PREFILLED_SYRINGE | INTRA_ARTICULAR | Status: AC | PRN
  Administered 2020-09-29: 30 mg via INTRA_ARTICULAR

## 2020-09-29 NOTE — Telephone Encounter (Signed)
Patient called requesting another refill on the hydrocodone-acetaminophen for her cracked rib pain, per Lovena Le we denied the request and advised to take tylenol or aleve for the pain

## 2020-10-04 ENCOUNTER — Other Ambulatory Visit: Payer: Self-pay | Admitting: Nurse Practitioner

## 2020-10-06 ENCOUNTER — Other Ambulatory Visit: Payer: Self-pay

## 2020-10-06 ENCOUNTER — Ambulatory Visit (INDEPENDENT_AMBULATORY_CARE_PROVIDER_SITE_OTHER): Payer: Medicare HMO | Admitting: Rheumatology

## 2020-10-06 ENCOUNTER — Ambulatory Visit: Payer: Self-pay

## 2020-10-06 DIAGNOSIS — M170 Bilateral primary osteoarthritis of knee: Secondary | ICD-10-CM

## 2020-10-06 MED ORDER — TIZANIDINE HCL 4 MG PO TABS: 4.0000 mg | ORAL_TABLET | Freq: Three times a day (TID) | ORAL | 1 refills | Status: DC

## 2020-10-06 MED ORDER — LIDOCAINE HCL 1 % IJ SOLN
1.5000 mL | INTRAMUSCULAR | Status: AC | PRN
  Administered 2020-10-06: 1.5 mL

## 2020-10-06 MED ORDER — LIDOCAINE HCL 1 % IJ SOLN
1.5000 mL | INTRAMUSCULAR | Status: AC | PRN
Start: 2020-10-06 — End: 2020-10-06
  Administered 2020-10-06: 1.5 mL

## 2020-10-06 MED ORDER — HYALURONAN 30 MG/2ML IX SOSY
30.0000 mg | PREFILLED_SYRINGE | INTRA_ARTICULAR | Status: AC | PRN
  Administered 2020-10-06: 30 mg via INTRA_ARTICULAR

## 2020-10-18 ENCOUNTER — Other Ambulatory Visit: Payer: Self-pay

## 2020-10-18 MED ORDER — LEVOTHYROXINE SODIUM 112 MCG PO TABS: 112.0000 ug | ORAL_TABLET | Freq: Every day | ORAL | 1 refills | Status: DC

## 2020-10-21 ENCOUNTER — Other Ambulatory Visit (HOSPITAL_COMMUNITY): Payer: Self-pay | Admitting: Psychiatry

## 2020-10-21 ENCOUNTER — Inpatient Hospital Stay: Payer: Medicare HMO | Attending: Oncology

## 2020-10-21 DIAGNOSIS — F603 Borderline personality disorder: Secondary | ICD-10-CM

## 2020-10-21 DIAGNOSIS — F332 Major depressive disorder, recurrent severe without psychotic features: Secondary | ICD-10-CM

## 2020-10-21 DIAGNOSIS — F411 Generalized anxiety disorder: Secondary | ICD-10-CM

## 2020-10-26 ENCOUNTER — Other Ambulatory Visit (HOSPITAL_COMMUNITY): Payer: Self-pay | Admitting: *Deleted

## 2020-10-26 DIAGNOSIS — F332 Major depressive disorder, recurrent severe without psychotic features: Secondary | ICD-10-CM

## 2020-10-26 DIAGNOSIS — F411 Generalized anxiety disorder: Secondary | ICD-10-CM

## 2020-10-26 DIAGNOSIS — F603 Borderline personality disorder: Secondary | ICD-10-CM

## 2020-10-26 MED ORDER — VENLAFAXINE HCL 100 MG PO TABS: 100.0000 mg | ORAL_TABLET | Freq: Three times a day (TID) | ORAL | 4 refills | Status: DC

## 2020-10-26 MED ORDER — HYDROXYZINE HCL 50 MG PO TABS: 50.0000 mg | ORAL_TABLET | Freq: Three times a day (TID) | ORAL | 1 refills | Status: DC | PRN

## 2020-11-08 ENCOUNTER — Other Ambulatory Visit (HOSPITAL_COMMUNITY): Payer: Self-pay | Admitting: Psychiatry

## 2020-11-08 ENCOUNTER — Other Ambulatory Visit (HOSPITAL_COMMUNITY): Payer: Self-pay | Admitting: *Deleted

## 2020-11-08 DIAGNOSIS — F411 Generalized anxiety disorder: Secondary | ICD-10-CM

## 2020-11-08 MED ORDER — BUSPIRONE HCL 30 MG PO TABS: ORAL_TABLET | ORAL | 2 refills | Status: DC

## 2020-11-15 ENCOUNTER — Other Ambulatory Visit: Payer: Self-pay | Admitting: Physician Assistant

## 2020-11-15 DIAGNOSIS — M0579 Rheumatoid arthritis with rheumatoid factor of multiple sites without organ or systems involvement: Secondary | ICD-10-CM

## 2020-11-15 NOTE — Telephone Encounter (Signed)
Last Visit: 07/01/2020 Next Visit: 12/07/2020 Labs: 09/23/2020, Hemoglobin 11.7, 07/20/2020, BMP  Eye exam: 09/08/2020  Current Dose per office note 07/01/2020, Plaquenil 200 mg 1 tablet by mouth twice daily. DX: Rheumatoid arthritis involving multiple sites with positive rheumatoid factor   Last Fill: 08/23/2020  Okay to refill Plaquenil?

## 2020-11-23 NOTE — Progress Notes (Deleted)
Office Visit Note  Patient: Jamie Bennett             Date of Birth: 10/19/1960           MRN: [id number]             PCP: Lavera Guise, MD Referring: Lavera Guise, MD Visit Date: 12/07/2020 Occupation: @GUAROCC @  Subjective:  No chief complaint on file.   History of Present Illness: Jamie Bennett is a 60 y.o. female ***   Activities of Daily Living:  Patient reports morning stiffness for *** {minute/hour:19697}.   Patient {ACTIONS;DENIES/REPORTS:21021675::"Denies"} nocturnal pain.  Difficulty dressing/grooming: {ACTIONS;DENIES/REPORTS:21021675::"Denies"} Difficulty climbing stairs: {ACTIONS;DENIES/REPORTS:21021675::"Denies"} Difficulty getting out of chair: {ACTIONS;DENIES/REPORTS:21021675::"Denies"} Difficulty using hands for taps, buttons, cutlery, and/or writing: {ACTIONS;DENIES/REPORTS:21021675::"Denies"}  No Rheumatology ROS completed.   PMFS History:  Patient Active Problem List   Diagnosis Date Noted  . Neck pain, musculoskeletal 03/24/2020  . Bipolar affective disorder, current episode mixed ([place]) 11/09/2019  . Encounter for general adult medical examination with abnormal findings 06/15/2019  . Closed fracture of left elbow 06/15/2019  . Urinary tract infection without hematuria 06/15/2019  . Encounter for long-term (current) use of medications 06/15/2019  . Acute upper respiratory infection 06/06/2019  . Exposure to COVID-19 virus 06/06/2019  . Exercise-induced asthma 12/15/2018  . Screening for breast cancer 05/29/2018  . Duodenal ulcer disease 05/29/2018  . Screening for malignant neoplasm of cervix 05/29/2018  . Dysuria 05/29/2018  . Surgical wound dehiscence, initial encounter 04/14/2018  . Postoperative abdominal hernia with obstruction   . Incarcerated ventral hernia 03/22/2018  . SBO (small bowel obstruction) ([place]) 03/07/2018  . Abscess of abdominal wall 03/01/2018  . Persistent umbilical sinus 17/00/1749  . Ringworm of body 01/23/2018   . Atopic dermatitis 12/05/2017  . Adjustment disorder with mixed anxiety and depressed mood 03/10/2017  . Major depressive disorder 03/10/2017  . Primary osteoarthritis of both knees 01/19/2017  . Suicide attempt ([place]) 07/10/2016  . Fibromyalgia 07/10/2016  . High risk medication use 07/10/2016  . AKI (acute kidney injury) ([place]) 11/09/2015  . Elevated troponin 11/09/2015  . Hypotension 11/09/2015  . Respiratory failure ([place])   . Acute hepatic failure 11/08/2015  . Drug overdose 11/08/2015  . Fatty infiltration of liver 02/16/2015  . Hepatic fibrosis 02/16/2015  . Abnormal serum level of alkaline phosphatase 02/15/2015  . Iron deficiency anemia 02/05/2015  . Vitamin B 12 deficiency 02/05/2015  . OP (osteoporosis) 06/16/2014  . Rheumatoid arteritis ([place]) 03/02/2014  . Hypothyroidism 03/02/2014  . Rheumatic fever without heart involvement 03/02/2014  . Adult hypothyroidism 03/02/2014  . Arthritis of pelvic region, degenerative 03/02/2014  . Bipolar 1 disorder, depressed ([place]) 02/26/2014  . Bariatric surgery status 11/24/2013  . Affective bipolar disorder ([place]) 11/24/2013  . Bipolar affective disorder ([place]) 11/24/2013  . Rheumatoid arthritis ([place]) 11/24/2013  . Polysubstance (excluding opioids) dependence ([place]) 09/11/2013  . Polysubstance dependence ([place]) 09/11/2013  . Combined drug dependence excluding opioids ([place]) 09/11/2013  . Arthritis or polyarthritis, rheumatoid ([place]) 09/05/2013  . Leg weakness 09/05/2013    Past Medical History:  Diagnosis Date  . Anemia   . Anxiety   . Bipolar 1 disorder ([place])   . Collagen vascular disease ([place])    rhematoid arthritis  . Constipation   . Fibromyalgia   . GERD (gastroesophageal reflux disease)   . Heart murmur    mild-asymptomatic  . Hepatic steatosis   . History of Roux-en-Y gastric bypass   . Hypotension   . Hypothyroidism   .  Opioid abuse ([place])   . Osteoarthritis   . Osteoporosis   . PONV (postoperative nausea and  vomiting)    nausea only  . Rheumatic fever   . Rheumatoid arthritis ([place])   . Thyroid disease     Family History  Problem Relation Age of Onset  . Depression Mother   . Dementia Mother   . Heart disease Mother   . Osteoporosis Mother   . Parkinson's disease Father   . Colon cancer Neg Hx    Past Surgical History:  Procedure Laterality Date  . CHOLECYSTECTOMY  2004  . COLONOSCOPY N/A 07/16/2017   Procedure: COLONOSCOPY;  Surgeon: Manya Silvas, MD;  Location: [facility] ENDOSCOPY;  Service: Endoscopy;  Laterality: N/A;  . EXPLORATORY LAPAROTOMY  2009  . GASTRIC BYPASS  2003   [place] N/A 03/01/2018   Procedure: EXPLORATORY LAPAROTOMY/ UMBILECTOMY;  Surgeon: Robert Bellow, MD;  Location: [facility] ORS;  Service: General;  Laterality: N/A;  . TONSILLECTOMY    . VENTRAL HERNIA REPAIR N/A 03/07/2018   Procedure: HERNIA REPAIR VENTRAL ADULT;  Surgeon: Robert Bellow, MD;  Location: [facility] ORS;  Service: General;  Laterality: N/A;   Social History   Social History Narrative  . Not on file   Immunization History  Administered Date(s) Administered  . Influenza,inj,Quad PF,6+ Mos 04/24/2016  . Moderna Sars-Covid-2 Vaccination 10/25/2019, 11/22/2019, 06/02/2020  . Pneumococcal Polysaccharide-23 03/02/2018     Objective: Vital Signs: There were no vitals taken for this visit.   Physical Exam   Musculoskeletal Exam: ***  CDAI Exam: CDAI Score: -- Patient Global: --; Provider Global: -- Swollen: --; Tender: -- Joint Exam 12/07/2020   No joint exam has been documented for this visit   There is currently no information documented on the homunculus. Go to the Rheumatology activity and complete the homunculus joint exam.  Investigation: No additional findings.  Imaging: No results found.  Recent Labs: Lab Results  Component Value Date   WBC 5.0 09/23/2020   HGB 11.7 (L) 09/23/2020   PLT 203 09/23/2020   NA 143 05/26/2020   K 3.8 05/26/2020   CL  105 05/26/2020   CO2 21 05/26/2020   GLUCOSE 84 05/26/2020   BUN 14 05/26/2020   CREATININE 0.76 05/26/2020   BILITOT 0.3 05/26/2020   ALKPHOS 115 05/26/2020   AST 21 05/26/2020   ALT 19 05/26/2020   PROT 6.2 05/26/2020   ALBUMIN 4.1 05/26/2020   CALCIUM 9.2 05/26/2020   GFRAA 99 05/26/2020    Speciality Comments: PLQ Eye Exam: 09/08/2020 WNL @ [facility] follow up in 6 months.  Procedures:  No procedures performed Allergies: Lactose intolerance (gi) and Sulfa antibiotics   Assessment / Plan:     Visit Diagnoses: No diagnosis found.  Orders: No orders of the defined types were placed in this encounter.  No orders of the defined types were placed in this encounter.   Face-to-face time spent with patient was *** minutes. Greater than 50% of time was spent in counseling and coordination of care.  Follow-Up Instructions: No follow-ups on file.   Earnestine Mealing, CMA  Note - This record has been created using Editor, commissioning.  Chart creation errors have been sought, but may not always  have been located. Such creation errors do not reflect on  the standard of medical care.

## 2020-11-24 ENCOUNTER — Other Ambulatory Visit: Payer: Self-pay

## 2020-11-24 ENCOUNTER — Ambulatory Visit (HOSPITAL_COMMUNITY): Payer: Medicare HMO | Admitting: Psychiatry

## 2020-11-24 DIAGNOSIS — F603 Borderline personality disorder: Secondary | ICD-10-CM

## 2020-11-24 DIAGNOSIS — F411 Generalized anxiety disorder: Secondary | ICD-10-CM | POA: Diagnosis not present

## 2020-11-24 DIAGNOSIS — R69 Illness, unspecified: Secondary | ICD-10-CM | POA: Diagnosis not present

## 2020-11-24 DIAGNOSIS — F332 Major depressive disorder, recurrent severe without psychotic features: Secondary | ICD-10-CM | POA: Diagnosis not present

## 2020-11-24 DIAGNOSIS — F325 Major depressive disorder, single episode, in full remission: Secondary | ICD-10-CM | POA: Diagnosis not present

## 2020-11-24 MED ORDER — BUSPIRONE HCL 30 MG PO TABS: ORAL_TABLET | ORAL | 2 refills | Status: DC

## 2020-11-24 MED ORDER — VENLAFAXINE HCL 100 MG PO TABS: 100.0000 mg | ORAL_TABLET | Freq: Three times a day (TID) | ORAL | 2 refills | Status: DC

## 2020-11-24 MED ORDER — DESIPRAMINE HCL 25 MG PO TABS: 25.0000 mg | ORAL_TABLET | Freq: Every day | ORAL | 2 refills | Status: DC

## 2020-11-24 MED ORDER — HYDROXYZINE HCL 50 MG PO TABS: ORAL_TABLET | ORAL | 1 refills | Status: DC

## 2020-11-24 MED ORDER — LAMOTRIGINE 150 MG PO TABS
150.0000 mg | ORAL_TABLET | Freq: Two times a day (BID) | ORAL | 2 refills | Status: DC
Start: 2020-11-24 — End: 2021-02-22

## 2020-11-24 MED ORDER — CLONAZEPAM 0.5 MG PO TABS: ORAL_TABLET | ORAL | 3 refills | Status: DC

## 2020-11-24 NOTE — Progress Notes (Signed)
`  Psychiatric Initial Adult Assessment   Patient Identification: Jamie Bennett MRN:  [id number] Date of Evaluation:  11/24/2020 Referral Source: grams per previous psychiatrist Chief Complaint:   Visit Diagnosis: borderline personality disorder   ICD-10-CM   1. Anxiety state  F41.1 clonazePAM (KLONOPIN) 0.5 MG tablet    busPIRone (BUSPAR) 30 MG tablet    venlafaxine (EFFEXOR) 100 MG tablet    hydrOXYzine (ATARAX/VISTARIL) 50 MG tablet  2. Borderline personality disorder ([place])  F60.3 venlafaxine (EFFEXOR) 100 MG tablet    lamoTRIgine (LAMICTAL) 150 MG tablet  3. Major depressive disorder, recurrent, severe without psychotic features (HCC)  F33.2 venlafaxine (EFFEXOR) 100 MG tablet    lamoTRIgine (LAMICTAL) 150 MG tablet    History of Present Illness:  Today the patient is actually doing pretty well.  The biggest stress is the fact that her mother-in-law is in a facility.  She has severe dementia and is in a facility in [place].  She actually is doing fairly well.  They have insurance to pay for it.  Actually her biggest stress is the way her husband is dealing with them.  He is having a hard time dealing with it.  Medically the patient actually is doing fairly well.  She is obese and has multiple arthritic conditions but seems to be functioning well.  Financially they are doing very good.  Patient has a good relationship with her husband.  Unfortunately the patient is therapy using DBT has ended and she is looking for a new therapist.  Today we will give her the names of some new therapist.  Patient takes her medicines as prescribed.  She drinks no alcohol and uses no drugs.  She has no evidence of psychosis.  Her mood is actually very even.  She is functioning very well.  She is actually doing well.  She says she gets particularly anxious when she visits her mother-in-law and asked for an increase in her medicine around that time.     Depression Symptoms:  fatigue, (Hypo) Manic Symptoms:    Anxiety Symptoms:   Psychotic Symptoms:   PTSD Symptoms:   Past Psychiatric History: 10 psychiatric hospitalizations multiple psychotropic medications presently in psychotherapy  Previous Psychotropic Medications: Yes   Substance Abuse History in the last 12 months:  Yes.    Consequences of Substance Abuse:   Past Medical History:  Past Medical History:  Diagnosis Date  . Anemia   . Anxiety   . Bipolar 1 disorder ([place])   . Collagen vascular disease ([place])    rhematoid arthritis  . Constipation   . Fibromyalgia   . GERD (gastroesophageal reflux disease)   . Heart murmur    mild-asymptomatic  . Hepatic steatosis   . History of Roux-en-Y gastric bypass   . Hypotension   . Hypothyroidism   . Opioid abuse ([place])   . Osteoarthritis   . Osteoporosis   . PONV (postoperative nausea and vomiting)    nausea only  . Rheumatic fever   . Rheumatoid arthritis ([place])   . Thyroid disease     Past Surgical History:  Procedure Laterality Date  . CHOLECYSTECTOMY  2004  . COLONOSCOPY N/A 07/16/2017   Procedure: COLONOSCOPY;  Surgeon: Manya Silvas, MD;  Location: [facility] ENDOSCOPY;  Service: Endoscopy;  Laterality: N/A;  . EXPLORATORY LAPAROTOMY  2009  . GASTRIC BYPASS  2003   [place] N/A 03/01/2018   Procedure: EXPLORATORY LAPAROTOMY/ UMBILECTOMY;  Surgeon: Robert Bellow, MD;  Location: [facility]  ORS;  Service: General;  Laterality: N/A;  . TONSILLECTOMY    . VENTRAL HERNIA REPAIR N/A 03/07/2018   Procedure: HERNIA REPAIR VENTRAL ADULT;  Surgeon: Robert Bellow, MD;  Location: [facility] ORS;  Service: General;  Laterality: N/A;    Family Psychiatric History:   Family History:  Family History  Problem Relation Age of Onset  . Depression Mother   . Dementia Mother   . Heart disease Mother   . Osteoporosis Mother   . Parkinson's disease Father   . Colon cancer Neg Hx     Social History:   Social History   Socioeconomic History  . Marital status:  Married    Spouse name: Not on file  . Number of children: Not on file  . Years of education: Not on file  . Highest education level: Not on file  Occupational History  . Not on file  Tobacco Use  . Smoking status: Never Smoker  . Smokeless tobacco: Never Used  Vaping Use  . Vaping Use: Never used  Substance and Sexual Activity  . Alcohol use: No    Alcohol/week: 0.0 standard drinks  . Drug use: No  . Sexual activity: Yes  Other Topics Concern  . Not on file  Social History Narrative  . Not on file   Social Determinants of Health   Financial Resource Strain: Not on file  Food Insecurity: Not on file  Transportation Needs: Not on file  Physical Activity: Not on file  Stress: Not on file  Social Connections: Not on file    Additional Social History:   Allergies:   Allergies  Allergen Reactions  . Lactose Intolerance (Gi) Other (See Comments)    Bloating and GI distress  . Sulfa Antibiotics Hives    Metabolic Disorder Labs: No results found for: HGBA1C, MPG No results found for: PROLACTIN Lab Results  Component Value Date   CHOL 169 05/26/2020   TRIG 136 05/26/2020   HDL 73 05/26/2020   LDLCALC 73 05/26/2020     Current Medications: Current Outpatient Medications  Medication Sig Dispense Refill  . hydroxychloroquine (PLAQUENIL) 200 MG tablet TAKE ONE TABLET BY MOUTH TWICE A DAY 180 tablet 0  . albuterol (VENTOLIN HFA) 108 (90 Base) MCG/ACT inhaler Inhale 2 puffs into the lungs every 6 (six) hours as needed for wheezing or shortness of breath. 1 Inhaler 3  . aspirin EC 81 MG tablet Take 81 mg by mouth daily.    . busPIRone (BUSPAR) 30 MG tablet 1 bid 60 tablet 2  . Cholecalciferol 100 MCG (4000 UT) CAPS Take 4,000 Units by mouth daily.     . clonazePAM (KLONOPIN) 0.5 MG tablet TAKE 1 TABLET BY MOUTH ONCE DAILY AS NEEDED FOR  ACUTE  ANXIETY  EMERGENCIES  ONLY  DO  NOT  EXCEED  ONE  PER  DAY 30 tablet 3  . Coenzyme Q-10 200 MG CAPS Take 200 mg by mouth 2 (two)  times daily.     . cyanocobalamin (,VITAMIN B-12,) 1000 MCG/ML injection Inject 1,000 mcg into the muscle every 30 (thirty) days.     Marland Kitchen denosumab (PROLIA) 60 MG/ML SOSY injection Inject 60 mg into the skin every 6 (six) months.    . desipramine (NORPRAMIN) 25 MG tablet Take 1 tablet (25 mg total) by mouth daily. 90 tablet 2  . esomeprazole (NEXIUM) 20 MG capsule Take 20 mg by mouth daily.     . Fiber, Guar Gum, CHEW Chew 5 mg by mouth daily.     Marland Kitchen  HYDROcodone-acetaminophen (NORCO/VICODIN) 5-325 MG tablet Take 1 tablet by mouth at bedtime as needed for moderate pain. 10 tablet 0  . hydrOXYzine (ATARAX/VISTARIL) 50 MG tablet 1  Tid   1  Prn q day 360 tablet 1  . ibuprofen (ADVIL,MOTRIN) 100 MG tablet Take 100 mg by mouth as needed for fever.    Javier Docker Oil 500 MG CAPS Take 500 mg by mouth daily.    Marland Kitchen lamoTRIgine (LAMICTAL) 150 MG tablet Take 1 tablet (150 mg total) by mouth 2 (two) times daily. 180 tablet 2  . levothyroxine (SYNTHROID) 112 MCG tablet Take 1 tablet (112 mcg total) by mouth daily. 90 tablet 1  . loratadine (CLARITIN) 10 MG tablet Take 10 mg by mouth daily as needed for allergies.    . polyethylene glycol (MIRALAX) packet Take 17 g by mouth daily. (Patient taking differently: Take 17 g by mouth as needed.) 14 each 0  . sucralfate (CARAFATE) 1 g tablet Take 1 tablet (1 g total) by mouth 2 (two) times daily. 180 tablet 0  . tiZANidine (ZANAFLEX) 4 MG tablet Take 1 tablet (4 mg total) by mouth 3 (three) times daily. 90 tablet 1  . traZODone (DESYREL) 100 MG tablet Take 1 tablet (100 mg total) by mouth at bedtime as needed and may repeat dose one time if needed for sleep. 60 tablet 5  . venlafaxine (EFFEXOR) 100 MG tablet Take 1 tablet (100 mg total) by mouth 3 (three) times daily with meals. 270 tablet 2   No current facility-administered medications for this visit.    Neurologic: Headache: No Seizure: No Paresthesias:NA  Musculoskeletal: Strength & Muscle Tone: within normal  limits Gait & Station: normal Patient leans: N/A  Psychiatric Specialty Exam: ROS  There were no vitals taken for this visit.There is no height or weight on file to calculate BMI.  General Appearance: Bizarre  Eye Contact:  Good  Speech:  Normal Rate  Volume:  Normal  Mood:  Anxious  Affect:  Congruent  Thought Process:  Goal Directed  Orientation:  NA  Thought Content:  Logical  Suicidal Thoughts:  No  Homicidal Thoughts:  No  Memory:  Negative  Judgement:  Fair  Insight:  NA and Good  Psychomotor Activity:  Normal  Concentration:    Recall:  Good  Fund of Knowledge:  Language: Good  Akathisia:  No  Handed:  Right  AIMS (if indicated):    Assets:  Desire for Improvement  ADL's:  Intact  Cognition: WNL  Sleep:      Treatment Plan Summary:  This patient is diagnosed with major clinical depression.  She takes Effexor 100 mg immediate release 3 a day.  She also takes desipramine 25 mg.  Her second problem is an adjustment disorder with an anxious mood state.  She will continue taking high-dose BuSpar 30 mg twice daily and today we will increase her Vistaril.  We will now ask her to take 50 mg 3 times daily and to take an extra Klonopin 50 mg when she visits her mother-in-law.  Her third problem is that of a personality disorder most likely that of borderline.  Today we will give her some referrals for DBT treatment.  Patient also continues taking Lamictal for mood stability.  Patient is not suicidal.  She is actually functioning quite well.  She will return to see me in approximately 3 months. Jerral Ralph, MD 4/27/20221:36 PM

## 2020-11-25 ENCOUNTER — Inpatient Hospital Stay: Payer: Medicare HMO | Attending: Oncology

## 2020-11-25 ENCOUNTER — Other Ambulatory Visit: Payer: Self-pay

## 2020-11-25 ENCOUNTER — Other Ambulatory Visit (HOSPITAL_COMMUNITY): Payer: Self-pay | Admitting: *Deleted

## 2020-11-25 DIAGNOSIS — E538 Deficiency of other specified B group vitamins: Secondary | ICD-10-CM

## 2020-11-25 DIAGNOSIS — Z79899 Other long term (current) drug therapy: Secondary | ICD-10-CM | POA: Insufficient documentation

## 2020-11-25 DIAGNOSIS — F411 Generalized anxiety disorder: Secondary | ICD-10-CM

## 2020-11-25 MED ORDER — CYANOCOBALAMIN 1000 MCG/ML IJ SOLN
1000.0000 ug | Freq: Once | INTRAMUSCULAR | Status: AC
  Administered 2020-11-25: 1000 ug via INTRAMUSCULAR
  Filled 2020-11-25: qty 1

## 2020-11-25 MED ORDER — BUSPIRONE HCL 30 MG PO TABS: ORAL_TABLET | ORAL | 0 refills | Status: DC

## 2020-11-29 ENCOUNTER — Ambulatory Visit: Payer: Medicare HMO | Admitting: Hospice and Palliative Medicine

## 2020-12-02 ENCOUNTER — Ambulatory Visit: Payer: Medicare HMO | Admitting: Physician Assistant

## 2020-12-05 ENCOUNTER — Other Ambulatory Visit: Payer: Self-pay | Admitting: Hospice and Palliative Medicine

## 2020-12-07 ENCOUNTER — Ambulatory Visit: Payer: Medicare HMO | Admitting: Physician Assistant

## 2020-12-07 DIAGNOSIS — M818 Other osteoporosis without current pathological fracture: Secondary | ICD-10-CM

## 2020-12-07 DIAGNOSIS — M797 Fibromyalgia: Secondary | ICD-10-CM

## 2020-12-07 DIAGNOSIS — M0579 Rheumatoid arthritis with rheumatoid factor of multiple sites without organ or systems involvement: Secondary | ICD-10-CM

## 2020-12-07 DIAGNOSIS — M7062 Trochanteric bursitis, left hip: Secondary | ICD-10-CM

## 2020-12-07 DIAGNOSIS — Z862 Personal history of diseases of the blood and blood-forming organs and certain disorders involving the immune mechanism: Secondary | ICD-10-CM

## 2020-12-07 DIAGNOSIS — M62838 Other muscle spasm: Secondary | ICD-10-CM

## 2020-12-07 DIAGNOSIS — M170 Bilateral primary osteoarthritis of knee: Secondary | ICD-10-CM

## 2020-12-07 DIAGNOSIS — Z8639 Personal history of other endocrine, nutritional and metabolic disease: Secondary | ICD-10-CM

## 2020-12-07 DIAGNOSIS — Z8719 Personal history of other diseases of the digestive system: Secondary | ICD-10-CM

## 2020-12-07 DIAGNOSIS — Z8659 Personal history of other mental and behavioral disorders: Secondary | ICD-10-CM

## 2020-12-07 DIAGNOSIS — M5030 Other cervical disc degeneration, unspecified cervical region: Secondary | ICD-10-CM

## 2020-12-07 DIAGNOSIS — M5136 Other intervertebral disc degeneration, lumbar region: Secondary | ICD-10-CM

## 2020-12-07 DIAGNOSIS — Z79899 Other long term (current) drug therapy: Secondary | ICD-10-CM

## 2020-12-07 DIAGNOSIS — M5134 Other intervertebral disc degeneration, thoracic region: Secondary | ICD-10-CM

## 2020-12-08 NOTE — Progress Notes (Signed)
Office Visit Note  Patient: Jamie Bennett             Date of Birth: October 26, 1960           MRN: [id number]             PCP: Lavera Guise, MD Referring: Lavera Guise, MD Visit Date: 12/21/2020 Occupation: @GUAROCC @  Subjective:  Left index finger pain   History of Present Illness: Jamie Bennett is a 60 y.o. female with history of seropositive rheumatoid arthritis, fibromyalgia, and osteoarthritis.  She is taking plaquenil 200 mg 1 tablet by mouth twice daily.  She is tolerating Plaquenil without any side effects and has not missed any doses recently.  She denies any recent rheumatoid arthritis flares.  She states that she has been experiencing significant discomfort for the past 1 month in her left index finger.  She has not tried using any topical agents but states the pain is worse with activity.  She denies any joint swelling.  She states that her knee joint pain has improved since undergoing Visco gel injections in March 2022.  She has occasional discomfort due to trochanter bursitis of both hips but overall her discomfort has been tolerable. She denies any recent infections.  Activities of Daily Living:  Patient reports morning stiffness for 1 hour.   Patient Denies nocturnal pain.  Difficulty dressing/grooming: Denies Difficulty climbing stairs: Reports Difficulty getting out of chair: Denies Difficulty using hands for taps, buttons, cutlery, and/or writing: Denies  Review of Systems  Constitutional: Positive for fatigue.  HENT: Positive for mouth dryness. Negative for mouth sores and nose dryness.   Eyes: Positive for dryness. Negative for pain and visual disturbance.  Respiratory: Negative for cough, hemoptysis and difficulty breathing.   Cardiovascular: Positive for swelling in legs/feet. Negative for chest pain, palpitations and hypertension.  Gastrointestinal: Negative for blood in stool, constipation and diarrhea.  Endocrine: Positive for cold intolerance and  heat intolerance. Negative for increased urination.  Genitourinary: Negative for difficulty urinating and painful urination.  Musculoskeletal: Positive for arthralgias, joint pain, joint swelling and morning stiffness. Negative for myalgias, muscle weakness, muscle tenderness and myalgias.  Skin: Negative for color change, pallor, rash, hair loss, nodules/bumps, skin tightness, ulcers and sensitivity to sunlight.  Neurological: Negative for dizziness, numbness and headaches.  Hematological: Positive for bruising/bleeding tendency. Negative for swollen glands.  Psychiatric/Behavioral: Negative for depressed mood and sleep disturbance. The patient is not nervous/anxious.     PMFS History:  Patient Active Problem List   Diagnosis Date Noted  . Neck pain, musculoskeletal 03/24/2020  . Bipolar affective disorder, current episode mixed ([place]) 11/09/2019  . Encounter for general adult medical examination with abnormal findings 06/15/2019  . Closed fracture of left elbow 06/15/2019  . Urinary tract infection without hematuria 06/15/2019  . Encounter for long-term (current) use of medications 06/15/2019  . Acute upper respiratory infection 06/06/2019  . Exposure to COVID-19 virus 06/06/2019  . Exercise-induced asthma 12/15/2018  . Screening for breast cancer 05/29/2018  . Duodenal ulcer disease 05/29/2018  . Screening for malignant neoplasm of cervix 05/29/2018  . Dysuria 05/29/2018  . Surgical wound dehiscence, initial encounter 04/14/2018  . Postoperative abdominal hernia with obstruction   . Incarcerated ventral hernia 03/22/2018  . SBO (small bowel obstruction) ([place]) 03/07/2018  . Abscess of abdominal wall 03/01/2018  . Persistent umbilical sinus 56/38/7564  . Ringworm of body 01/23/2018  . Atopic dermatitis 12/05/2017  . Adjustment disorder with mixed anxiety and depressed mood  03/10/2017  . Major depressive disorder 03/10/2017  . Primary osteoarthritis of both knees 01/19/2017  .  Suicide attempt ([place]) 07/10/2016  . Fibromyalgia 07/10/2016  . High risk medication use 07/10/2016  . AKI (acute kidney injury) ([place]) 11/09/2015  . Elevated troponin 11/09/2015  . Hypotension 11/09/2015  . Respiratory failure ([place])   . Acute hepatic failure 11/08/2015  . Drug overdose 11/08/2015  . Fatty infiltration of liver 02/16/2015  . Hepatic fibrosis 02/16/2015  . Abnormal serum level of alkaline phosphatase 02/15/2015  . Iron deficiency anemia 02/05/2015  . Vitamin B 12 deficiency 02/05/2015  . OP (osteoporosis) 06/16/2014  . Rheumatoid arteritis ([place]) 03/02/2014  . Hypothyroidism 03/02/2014  . Rheumatic fever without heart involvement 03/02/2014  . Adult hypothyroidism 03/02/2014  . Arthritis of pelvic region, degenerative 03/02/2014  . Bipolar 1 disorder, depressed ([place]) 02/26/2014  . Bariatric surgery status 11/24/2013  . Affective bipolar disorder ([place]) 11/24/2013  . Bipolar affective disorder ([place]) 11/24/2013  . Rheumatoid arthritis ([place]) 11/24/2013  . Polysubstance (excluding opioids) dependence ([place]) 09/11/2013  . Polysubstance dependence ([place]) 09/11/2013  . Combined drug dependence excluding opioids ([place]) 09/11/2013  . Arthritis or polyarthritis, rheumatoid ([place]) 09/05/2013  . Leg weakness 09/05/2013    Past Medical History:  Diagnosis Date  . Anemia   . Anxiety   . Bipolar 1 disorder ([place])   . Collagen vascular disease ([place])    rhematoid arthritis  . Constipation   . Fibromyalgia   . GERD (gastroesophageal reflux disease)   . Heart murmur    mild-asymptomatic  . Hepatic steatosis   . History of Roux-en-Y gastric bypass   . Hypotension   . Hypothyroidism   . Opioid abuse ([place])   . Osteoarthritis   . Osteoporosis   . PONV (postoperative nausea and vomiting)    nausea only  . Rheumatic fever   . Rheumatoid arthritis ([place])   . Thyroid disease     Family History  Problem Relation Age of Onset  . Depression Mother   . Dementia Mother   . Heart  disease Mother   . Osteoporosis Mother   . Parkinson's disease Father   . Colon cancer Neg Hx    Past Surgical History:  Procedure Laterality Date  . CHOLECYSTECTOMY  2004  . COLONOSCOPY N/A 07/16/2017   Procedure: COLONOSCOPY;  Surgeon: Manya Silvas, MD;  Location[facility] ENDOSCOPY;  Service: Endoscopy;  Laterality: N/A;  . EXPLORATORY LAPAROTOMY  2009  . GASTRIC BYPASS  2003   [place] N/A 03/01/2018   Procedure: EXPLORATORY LAPAROTOMY/ UMBILECTOMY;  Surgeon: Robert Bellow, MD;  Location: [facility] ORS;  Service: General;  Laterality: N/A;  . TONSILLECTOMY    . VENTRAL HERNIA REPAIR N/A 03/07/2018   Procedure: HERNIA REPAIR VENTRAL ADULT;  Surgeon: Robert Bellow, MD;  Location: [facility] ORS;  Service: General;  Laterality: N/A;   Social History   Social History Narrative  . Not on file   Immunization History  Administered Date(s) Administered  . Influenza,inj,Quad PF,6+ Mos 04/24/2016  . Moderna Sars-Covid-2 Vaccination 10/25/2019, 11/22/2019, 06/02/2020  . Pneumococcal Polysaccharide-23 03/02/2018     Objective: Vital Signs: BP 126/83 (BP Location: Left Arm, Patient Position: Sitting, Cuff Size: Large)   Pulse 71   Resp 18   Ht 5\' 5"  (1.651 m)   Wt (!) 340 lb (154.2 kg)   BMI 56.58 kg/m    Physical Exam Vitals and nursing note reviewed.  Constitutional:      Appearance: She is well-developed.  HENT:     Head: Normocephalic and atraumatic.  Eyes:     Conjunctiva/sclera: Conjunctivae normal.  Pulmonary:     Effort: Pulmonary effort is normal.  Abdominal:     Palpations: Abdomen is soft.  Musculoskeletal:     Cervical back: Normal range of motion.  Skin:    General: Skin is warm and dry.     Capillary Refill: Capillary refill takes less than 2 seconds.  Neurological:     Mental Status: She is alert and oriented to person, place, and time.  Psychiatric:        Behavior: Behavior normal.      Musculoskeletal Exam: C-spine has good  range of motion with no discomfort.  Thoracic and lumbar spine range of motion was difficult to assess in seated position.  Midline spinal tenderness in the lumbar region noted.  Shoulder joints, elbow joints, wrist joints, MCPs, PIPs, DIPs have good range of motion with no synovitis.  Tenderness and erythema over the left first DIP joint.  No tenderness or synovitis over MCP joints.  Complete fist formation bilaterally.  Knee joints have good range of motion with no warmth or effusion.  Ankle joints have good range of motion with no tenderness or joint swelling.  CDAI Exam: CDAI Score: 0.4  Patient Global: 2 mm; Provider Global: 2 mm Swollen: 0 ; Tender: 0  Joint Exam 12/21/2020   No joint exam has been documented for this visit   There is currently no information documented on the homunculus. Go to the Rheumatology activity and complete the homunculus joint exam.  Investigation: No additional findings.  Imaging: No results found.  Recent Labs: Lab Results  Component Value Date   WBC 5.0 09/23/2020   HGB 11.7 (L) 09/23/2020   PLT 203 09/23/2020   NA 143 05/26/2020   K 3.8 05/26/2020   CL 105 05/26/2020   CO2 21 05/26/2020   GLUCOSE 84 05/26/2020   BUN 14 05/26/2020   CREATININE 0.76 05/26/2020   BILITOT 0.3 05/26/2020   ALKPHOS 115 05/26/2020   AST 21 05/26/2020   ALT 19 05/26/2020   PROT 6.2 05/26/2020   ALBUMIN 4.1 05/26/2020   CALCIUM 9.2 05/26/2020   GFRAA 99 05/26/2020    Speciality Comments: PLQ Eye Exam: 09/08/2020 WNL @ [place] follow up in 6 months.  Procedures:  No procedures performed Allergies: Lactose intolerance (gi) and Sulfa antibiotics   Assessment / Plan:     Visit Diagnoses: Rheumatoid arthritis involving multiple sites with positive rheumatoid factor ([place]): She has no synovitis on examination today.  She has not had any recent rheumatoid arthritis flares.  She is clinically doing well taking Plaquenil 200 mg 1 tablet by mouth twice daily.   She continues to tolerate Plaquenil without any side effects and has not missed any doses.  She presents today with tenderness and erythema of the DIP joint of the left index finger due to underlying osteoarthritis.  Different treatment options were discussed today in detail.  She was strongly encouraged to purchase arthritis compression gloves, perform hand exercises, and discussed the use of topical agents for pain relief.  If her discomfort and inflammation persists or worsens we can discuss proceeding with an ultrasound-guided cortisone injection in the future.  She will continue on the current treatment regimen.  She does not need a refill at this time.  She was advised to notify us if she develops increased joint pain or joint swelling.  She will follow-up in the office  in 5 months.  High risk medication use - Plaquenil 200 mg 1 tablet by mouth twice daily.  PLQ Eye Exam: 09/08/2020.  CBC was drawn on 09/23/2020.  BMP drawn on 07/20/2020.  She is due to update lab work today.  She will be having updated lab work next week.  She was given orders to have CBC with differential and CMP with GFR drawn at that time. She has not had any recent infections.  Fibromyalgia - She continues to experience intermittent myalgias and muscle tenderness due to underlying fibromyalgia.  She remains on Effexor as prescribed and tizanidine 4 mg 3 times daily as needed for muscle spasms.  She continues to have chronic fatigue secondary to insomnia.  She takes trazodone 100 mg at bedtime to help her sleep at night.  Trapezius muscle spasm: She has ongoing trapezius muscle tension and muscle tenderness bilaterally.  She takes tizanidine 4 mg 3 times daily as needed for muscle spasms.  Trochanteric bursitis of left hip: She experiences occasional discomfort due to trochanter bursitis of the left hip.  She was strongly encouraged to perform stretching exercises on a daily basis.  Primary osteoarthritis of both knees - S/p  orthovisc bilateral knees 08/2020-09/2020.  She has noticed significant clinical improvement since undergoing the visco series.  She has good ROM of both knee joints with no discomfort.  No warmth or effusion noted.   DDD (degenerative disc disease), cervical: She has good ROM with no discomfort at this time.  DDD (degenerative disc disease), thoracic  DDD (degenerative disc disease), lumbar: She experiences intermittent lower back pain.  No symptoms of radiculopathy.  She has mild midline spinal tenderness in the lumbar region.   Other osteoporosis without current pathological fracture - DEXA is not in epic for review.  She is on Prolia 60 mg subcutaneous injections every 6 months.  Other medical conditions are listed as follows:  History of gastroesophageal reflux (GERD)  History of anemia  History of bipolar disorder  History of hypothyroidism  Orders: No orders of the defined types were placed in this encounter.  No orders of the defined types were placed in this encounter.    Follow-Up Instructions: Return in about 5 months (around 05/23/2021) for Rheumatoid arthritis, Fibromyalgia, Osteoarthritis.   Ofilia Neas, PA-C  Note - This record has been created using Dragon software.  Chart creation errors have been sought, but may not always  have been located. Such creation errors do not reflect on  the standard of medical care.

## 2020-12-16 ENCOUNTER — Encounter: Payer: Self-pay | Admitting: Physician Assistant

## 2020-12-16 ENCOUNTER — Other Ambulatory Visit: Payer: Self-pay

## 2020-12-16 ENCOUNTER — Ambulatory Visit (INDEPENDENT_AMBULATORY_CARE_PROVIDER_SITE_OTHER): Payer: Medicare HMO | Admitting: Physician Assistant

## 2020-12-16 VITALS — BP 128/84 | HR 80 | Temp 97.5°F | Resp 16 | Ht 60.0 in | Wt 344.4 lb

## 2020-12-16 DIAGNOSIS — Z6841 Body Mass Index (BMI) 40.0 and over, adult: Secondary | ICD-10-CM

## 2020-12-16 DIAGNOSIS — G4710 Hypersomnia, unspecified: Secondary | ICD-10-CM | POA: Diagnosis not present

## 2020-12-16 DIAGNOSIS — R2242 Localized swelling, mass and lump, left lower limb: Secondary | ICD-10-CM | POA: Diagnosis not present

## 2020-12-16 DIAGNOSIS — S2242XS Multiple fractures of ribs, left side, sequela: Secondary | ICD-10-CM

## 2020-12-16 DIAGNOSIS — R0602 Shortness of breath: Secondary | ICD-10-CM

## 2020-12-16 NOTE — Progress Notes (Signed)
[facility] [place], [place] [address]  Internal MEDICINE  Office Visit Note  Patient Name: Jamie Bennett  [id number]  [id number]  Date of Service: 12/20/2020  Chief Complaint  Patient presents with  . Follow-up    Coldness and tingling in hands and feet, started about a month ago, SOB started within the last month, happens sometimes when standing up or walking short distances   . Gastroesophageal Reflux  . Anemia  . Anxiety    HPI Patient is here for routine follow-up -Has been having some headaches and takes tylenol and lays down. Thinks it is stress. Mother in law is in nursing home, and mom is living with brother in [place] and brother is ill. -Hands and feet get tingly and cold, and right ankle has been swelling some too. Comes and goes randomly. Some SOB as well in the last 6 weeks or so. -Takes trazodone to help her sleep. Snores and will wake up gasping. -She is followed by psych for depression and anxiety -She also sees rheumatology and endocrinology for OA and osteoporosis -Reports her rib fractures have been helaing well and she does not have much pain unless she sneezes unexpectedly--given SOB will have her get an updated CXR. Arlyce Harman in office was normal.    EPWORTH SLEEPINESS SCALE:  Scale:  (0)= no chance of dozing; (1)= slight chance of dozing; (2)= moderate chance of dozing; (3)= high chance of dozing  Chance  Situtation    Sitting and reading: 2    Watching TV: 2    Sitting Inactive in public: 1    As a passenger in car: 2      Lying down to rest: 3    Sitting and talking: 0    Sitting quielty after lunch: 1    In a car, stopped in traffic: 0   TOTAL SCORE:   11 out of 24   Current Medication: Outpatient Encounter Medications as of 12/16/2020  Medication Sig  . hydroxychloroquine (PLAQUENIL) 200 MG tablet TAKE ONE TABLET BY MOUTH TWICE A DAY  . albuterol (VENTOLIN HFA) 108 (90 Base) MCG/ACT inhaler Inhale 2  puffs into the lungs every 6 (six) hours as needed for wheezing or shortness of breath.  Marland Kitchen aspirin EC 81 MG tablet Take 81 mg by mouth daily.  . busPIRone (BUSPAR) 30 MG tablet 1 bid  . Cholecalciferol 100 MCG (4000 UT) CAPS Take 4,000 Units by mouth daily.   . clonazePAM (KLONOPIN) 0.5 MG tablet TAKE 1 TABLET BY MOUTH ONCE DAILY AS NEEDED FOR  ACUTE  ANXIETY  EMERGENCIES  ONLY  DO  NOT  EXCEED  ONE  PER  DAY  . Coenzyme Q-10 200 MG CAPS Take 200 mg by mouth 2 (two) times daily.   . cyanocobalamin (,VITAMIN B-12,) 1000 MCG/ML injection Inject 1,000 mcg into the muscle every 30 (thirty) days.   Marland Kitchen denosumab (PROLIA) 60 MG/ML SOSY injection Inject 60 mg into the skin every 6 (six) months.  . desipramine (NORPRAMIN) 25 MG tablet Take 1 tablet (25 mg total) by mouth daily.  Marland Kitchen esomeprazole (NEXIUM) 20 MG capsule Take 20 mg by mouth daily.   . Fiber, Guar Gum, CHEW Chew 5 mg by mouth daily.   Marland Kitchen HYDROcodone-acetaminophen (NORCO/VICODIN) 5-325 MG tablet Take 1 tablet by mouth at bedtime as needed for moderate pain.  . hydrOXYzine (ATARAX/VISTARIL) 50 MG tablet 1  Tid   1  Prn q day  . ibuprofen (ADVIL,MOTRIN) 100 MG tablet Take  100 mg by mouth as needed for fever.  Javier Docker Oil 500 MG CAPS Take 500 mg by mouth daily.  Marland Kitchen lamoTRIgine (LAMICTAL) 150 MG tablet Take 1 tablet (150 mg total) by mouth 2 (two) times daily.  Marland Kitchen levothyroxine (SYNTHROID) 112 MCG tablet Take 1 tablet (112 mcg total) by mouth daily.  Marland Kitchen loratadine (CLARITIN) 10 MG tablet Take 10 mg by mouth daily as needed for allergies.  . polyethylene glycol (MIRALAX) packet Take 17 g by mouth daily. (Patient taking differently: Take 17 g by mouth as needed.)  . sucralfate (CARAFATE) 1 g tablet Take 1 tablet (1 g total) by mouth 2 (two) times daily.  Marland Kitchen tiZANidine (ZANAFLEX) 4 MG tablet TAKE 1 TABLET BY MOUTH THREE TIMES DAILY  . traZODone (DESYREL) 100 MG tablet Take 1 tablet (100 mg total) by mouth at bedtime as needed and may repeat dose one time if  needed for sleep.  Marland Kitchen venlafaxine (EFFEXOR) 100 MG tablet Take 1 tablet (100 mg total) by mouth 3 (three) times daily with meals.   No facility-administered encounter medications on file as of 12/16/2020.    Surgical History: Past Surgical History:  Procedure Laterality Date  . CHOLECYSTECTOMY  2004  . COLONOSCOPY N/A 07/16/2017   Procedure: COLONOSCOPY;  Surgeon: Manya Silvas, MD;  Location: [facility] ENDOSCOPY;  Service: Endoscopy;  Laterality: N/A;  . EXPLORATORY LAPAROTOMY  2009  . GASTRIC BYPASS  2003   [place] N/A 03/01/2018   Procedure: EXPLORATORY LAPAROTOMY/ UMBILECTOMY;  Surgeon: Robert Bellow, MD;  Location: [facility] ORS;  Service: General;  Laterality: N/A;  . TONSILLECTOMY    . VENTRAL HERNIA REPAIR N/A 03/07/2018   Procedure: HERNIA REPAIR VENTRAL ADULT;  Surgeon: Robert Bellow, MD;  Location: [facility] ORS;  Service: General;  Laterality: N/A;    Medical History: Past Medical History:  Diagnosis Date  . Anemia   . Anxiety   . Bipolar 1 disorder ([place])   . Collagen vascular disease ([place])    rhematoid arthritis  . Constipation   . Fibromyalgia   . GERD (gastroesophageal reflux disease)   . Heart murmur    mild-asymptomatic  . Hepatic steatosis   . History of Roux-en-Y gastric bypass   . Hypotension   . Hypothyroidism   . Opioid abuse ([place])   . Osteoarthritis   . Osteoporosis   . PONV (postoperative nausea and vomiting)    nausea only  . Rheumatic fever   . Rheumatoid arthritis ([place])   . Thyroid disease     Family History: Family History  Problem Relation Age of Onset  . Depression Mother   . Dementia Mother   . Heart disease Mother   . Osteoporosis Mother   . Parkinson's disease Father   . Colon cancer Neg Hx     Social History   Socioeconomic History  . Marital status: Married    Spouse name: Not on file  . Number of children: Not on file  . Years of education: Not on file  . Highest education level: Not on file   Occupational History  . Not on file  Tobacco Use  . Smoking status: Never Smoker  . Smokeless tobacco: Never Used  Vaping Use  . Vaping Use: Never used  Substance and Sexual Activity  . Alcohol use: No    Alcohol/week: 0.0 standard drinks  . Drug use: No  . Sexual activity: Yes  Other Topics Concern  . Not on file  Social History Narrative  .  Not on file   Social Determinants of Health   Financial Resource Strain: Not on file  Food Insecurity: Not on file  Transportation Needs: Not on file  Physical Activity: Not on file  Stress: Not on file  Social Connections: Not on file  Intimate Partner Violence: Not on file      Review of Systems  Constitutional: Positive for fatigue. Negative for chills and unexpected weight change.  HENT: Negative for congestion, postnasal drip, rhinorrhea, sneezing and sore throat.   Eyes: Negative for redness.  Respiratory: Positive for shortness of breath. Negative for cough, chest tightness and wheezing.   Cardiovascular: Negative for chest pain and palpitations.  Gastrointestinal: Negative for abdominal pain, constipation, diarrhea, nausea and vomiting.  Genitourinary: Negative for dysuria and frequency.  Musculoskeletal: Positive for arthralgias and back pain. Negative for joint swelling and neck pain.  Skin: Negative for rash.  Neurological: Negative.  Negative for tremors and numbness.  Hematological: Negative for adenopathy. Does not bruise/bleed easily.  Psychiatric/Behavioral: Positive for sleep disturbance. Negative for behavioral problems (Depression) and suicidal ideas. The patient is not nervous/anxious.     Vital Signs: BP 128/84   Pulse 80   Temp (!) 97.5 F (36.4 C)   Resp 16   Ht 5' (1.524 m)   Wt (!) 344 lb 6.4 oz (156.2 kg)   SpO2 98%   BMI 67.26 kg/m    Physical Exam Vitals and nursing note reviewed.  Constitutional:      General: She is not in acute distress.    Appearance: She is well-developed. She is  obese. She is not diaphoretic.  HENT:     Head: Normocephalic and atraumatic.     Mouth/Throat:     Pharynx: No oropharyngeal exudate.  Eyes:     Pupils: Pupils are equal, round, and reactive to light.  Neck:     Thyroid: No thyromegaly.     Vascular: No JVD.     Trachea: No tracheal deviation.  Cardiovascular:     Rate and Rhythm: Normal rate and regular rhythm.     Heart sounds: Normal heart sounds. No murmur heard. No friction rub. No gallop.   Pulmonary:     Effort: Pulmonary effort is normal. No respiratory distress.     Breath sounds: No wheezing or rales.  Chest:     Chest wall: No tenderness.  Abdominal:     General: Bowel sounds are normal.     Palpations: Abdomen is soft.  Musculoskeletal:        General: Normal range of motion.     Cervical back: Normal range of motion and neck supple.     Right lower leg: No edema.     Left lower leg: Edema present.  Lymphadenopathy:     Cervical: No cervical adenopathy.  Skin:    General: Skin is warm and dry.  Neurological:     Mental Status: She is alert and oriented to person, place, and time.     Cranial Nerves: No cranial nerve deficit.     Sensory: No sensory deficit.  Psychiatric:        Behavior: Behavior normal.        Thought Content: Thought content normal.        Judgment: Judgment normal.        Assessment/Plan: 1. Hypersomnia Based on daytime sleepiness, snoring, waking up gasping, elevated BMI patient would benefit from a sleep study to rule out OSA - PSG SLEEP STUDY  2. SOB (shortness of  breath) We will go ahead and get an updated chest x-ray given recent rib fractures.  Arlyce Harman in office was normal today and is reassuring.  We will also get an echocardiogram due to shortness of breath with lower extremity swelling - ECHOCARDIOGRAM COMPLETE - Spirometry with Graph - DG Chest 2 View  3. Closed fracture of multiple ribs of left side, sequela Will update chest x-ray  4. Localized swelling of left  lower extremity Will continue to monitor only swells, patient will elevate legs when sitting.  We will also order an echo - ECHOCARDIOGRAM COMPLETE  5. Morbid obesity with BMI of 60.0-69.9, adult ([place]) Obesity Counseling: Had a lengthy discussion regarding patients BMI and weight issues. Patient was instructed on portion control as well as increased activity. Also discussed caloric restrictions with trying to maintain intake less than 2000 Kcal. Discussions were made in accordance with the 5As of weight management. Simple actions such as not eating late and if able to, taking a walk is suggested.    General Counseling: shaquetta arcos understanding of the findings of todays visit and agrees with plan of treatment. I have discussed any further diagnostic evaluation that may be needed or ordered today. We also reviewed her medications today. she has been encouraged to call the office with any questions or concerns that should arise related to todays visit.    Orders Placed This Encounter  Procedures  . DG Chest 2 View  . ECHOCARDIOGRAM COMPLETE  . Spirometry with Graph  . PSG SLEEP STUDY    No orders of the defined types were placed in this encounter.   This patient was seen by Drema Dallas, PA-C in collaboration with Dr. Clayborn Bigness as a part of collaborative care agreement.   Total time spent:40 Minutes Time spent includes review of chart, medications, test results, and follow up plan with the patient.      Dr Lavera Guise Internal medicine

## 2020-12-20 ENCOUNTER — Inpatient Hospital Stay: Payer: Medicare HMO

## 2020-12-20 ENCOUNTER — Inpatient Hospital Stay: Payer: Medicare HMO | Admitting: Oncology

## 2020-12-21 ENCOUNTER — Other Ambulatory Visit: Payer: Self-pay

## 2020-12-21 ENCOUNTER — Encounter: Payer: Self-pay | Admitting: Physician Assistant

## 2020-12-21 ENCOUNTER — Ambulatory Visit: Payer: Medicare HMO | Admitting: Physician Assistant

## 2020-12-21 VITALS — BP 126/83 | HR 71 | Resp 18 | Ht 65.0 in | Wt 340.0 lb

## 2020-12-21 DIAGNOSIS — Z8719 Personal history of other diseases of the digestive system: Secondary | ICD-10-CM

## 2020-12-21 DIAGNOSIS — M5030 Other cervical disc degeneration, unspecified cervical region: Secondary | ICD-10-CM

## 2020-12-21 DIAGNOSIS — M5136 Other intervertebral disc degeneration, lumbar region: Secondary | ICD-10-CM | POA: Diagnosis not present

## 2020-12-21 DIAGNOSIS — M7062 Trochanteric bursitis, left hip: Secondary | ICD-10-CM | POA: Diagnosis not present

## 2020-12-21 DIAGNOSIS — M818 Other osteoporosis without current pathological fracture: Secondary | ICD-10-CM

## 2020-12-21 DIAGNOSIS — M0579 Rheumatoid arthritis with rheumatoid factor of multiple sites without organ or systems involvement: Secondary | ICD-10-CM | POA: Diagnosis not present

## 2020-12-21 DIAGNOSIS — M62838 Other muscle spasm: Secondary | ICD-10-CM | POA: Diagnosis not present

## 2020-12-21 DIAGNOSIS — M170 Bilateral primary osteoarthritis of knee: Secondary | ICD-10-CM

## 2020-12-21 DIAGNOSIS — Z862 Personal history of diseases of the blood and blood-forming organs and certain disorders involving the immune mechanism: Secondary | ICD-10-CM

## 2020-12-21 DIAGNOSIS — Z8639 Personal history of other endocrine, nutritional and metabolic disease: Secondary | ICD-10-CM

## 2020-12-21 DIAGNOSIS — Z79899 Other long term (current) drug therapy: Secondary | ICD-10-CM | POA: Diagnosis not present

## 2020-12-21 DIAGNOSIS — M797 Fibromyalgia: Secondary | ICD-10-CM

## 2020-12-21 DIAGNOSIS — Z8659 Personal history of other mental and behavioral disorders: Secondary | ICD-10-CM

## 2020-12-21 DIAGNOSIS — M5134 Other intervertebral disc degeneration, thoracic region: Secondary | ICD-10-CM | POA: Diagnosis not present

## 2020-12-21 NOTE — Patient Instructions (Signed)
Hand Exercises Hand exercises can be helpful for almost anyone. These exercises can strengthen the hands, improve flexibility and movement, and increase blood flow to the hands. These results can make work and daily tasks easier. Hand exercises can be especially helpful for people who have joint pain from arthritis or have nerve damage from overuse (carpal tunnel syndrome). These exercises can also help people who have injured a hand. Exercises Most of these hand exercises are gentle stretching and motion exercises. It is usually safe to do them often throughout the day. Warming up your hands before exercise may help to reduce stiffness. You can do this with gentle massage or by placing your hands in warm water for 10-15 minutes. It is normal to feel some stretching, pulling, tightness, or mild discomfort as you begin new exercises. This will gradually improve. Stop an exercise right away if you feel sudden, severe pain or your pain gets worse. Ask your health care provider which exercises are best for you. Knuckle bend or "claw" fist 1. Stand or sit with your arm, hand, and all five fingers pointed straight up. Make sure to keep your wrist straight during the exercise. 2. Gently bend your fingers down toward your palm until the tips of your fingers are touching the top of your palm. Keep your big knuckle straight and just bend the small knuckles in your fingers. 3. Hold this position for __________ seconds. 4. Straighten (extend) your fingers back to the starting position. Repeat this exercise 5-10 times with each hand. Full finger fist 1. Stand or sit with your arm, hand, and all five fingers pointed straight up. Make sure to keep your wrist straight during the exercise. 2. Gently bend your fingers into your palm until the tips of your fingers are touching the middle of your palm. 3. Hold this position for __________ seconds. 4. Extend your fingers back to the starting position, stretching every  joint fully. Repeat this exercise 5-10 times with each hand. Straight fist 1. Stand or sit with your arm, hand, and all five fingers pointed straight up. Make sure to keep your wrist straight during the exercise. 2. Gently bend your fingers at the big knuckle, where your fingers meet your hand, and the middle knuckle. Keep the knuckle at the tips of your fingers straight and try to touch the bottom of your palm. 3. Hold this position for __________ seconds. 4. Extend your fingers back to the starting position, stretching every joint fully. Repeat this exercise 5-10 times with each hand. Tabletop 1. Stand or sit with your arm, hand, and all five fingers pointed straight up. Make sure to keep your wrist straight during the exercise. 2. Gently bend your fingers at the big knuckle, where your fingers meet your hand, as far down as you can while keeping the small knuckles in your fingers straight. Think of forming a tabletop with your fingers. 3. Hold this position for __________ seconds. 4. Extend your fingers back to the starting position, stretching every joint fully. Repeat this exercise 5-10 times with each hand. Finger spread 1. Place your hand flat on a table with your palm facing down. Make sure your wrist stays straight as you do this exercise. 2. Spread your fingers and thumb apart from each other as far as you can until you feel a gentle stretch. Hold this position for __________ seconds. 3. Bring your fingers and thumb tight together again. Hold this position for __________ seconds. Repeat this exercise 5-10 times with each hand.   Making circles 1. Stand or sit with your arm, hand, and all five fingers pointed straight up. Make sure to keep your wrist straight during the exercise. 2. Make a circle by touching the tip of your thumb to the tip of your index finger. 3. Hold for __________ seconds. Then open your hand wide. 4. Repeat this motion with your thumb and each finger on your  hand. Repeat this exercise 5-10 times with each hand. Thumb motion 1. Sit with your forearm resting on a table and your wrist straight. Your thumb should be facing up toward the ceiling. Keep your fingers relaxed as you move your thumb. 2. Lift your thumb up as high as you can toward the ceiling. Hold for __________ seconds. 3. Bend your thumb across your palm as far as you can, reaching the tip of your thumb for the small finger (pinkie) side of your palm. Hold for __________ seconds. Repeat this exercise 5-10 times with each hand. Grip strengthening 1. Hold a stress ball or other soft ball in the middle of your hand. 2. Slowly increase the pressure, squeezing the ball as much as you can without causing pain. Think of bringing the tips of your fingers into the middle of your palm. All of your finger joints should bend when doing this exercise. 3. Hold your squeeze for __________ seconds, then relax. Repeat this exercise 5-10 times with each hand.   Contact a health care provider if:  Your hand pain or discomfort gets much worse when you do an exercise.  Your hand pain or discomfort does not improve within 2 hours after you exercise. If you have any of these problems, stop doing these exercises right away. Do not do them again unless your health care provider says that you can. Get help right away if:  You develop sudden, severe hand pain or swelling. If this happens, stop doing these exercises right away. Do not do them again unless your health care provider says that you can. This information is not intended to replace advice given to you by your health care provider. Make sure you discuss any questions you have with your health care provider. Document Revised: 11/07/2018 Document Reviewed: 07/18/2018 Elsevier Patient Education  2021 [employer]  

## 2020-12-28 ENCOUNTER — Other Ambulatory Visit: Payer: Self-pay

## 2020-12-28 ENCOUNTER — Ambulatory Visit
Admission: RE | Admit: 2020-12-28 | Discharge: 2020-12-28 | Disposition: A | Discharge status: Home / Self Care | Payer: Medicare HMO | Attending: Physician Assistant | Admitting: Physician Assistant

## 2020-12-28 ENCOUNTER — Ambulatory Visit
Admission: RE | Admit: 2020-12-28 | Discharge: 2020-12-28 | Disposition: A | Discharge status: Home / Self Care | Payer: Medicare HMO | Source: Ambulatory Visit | Attending: Physician Assistant | Admitting: Physician Assistant

## 2020-12-28 DIAGNOSIS — R0602 Shortness of breath: Secondary | ICD-10-CM | POA: Insufficient documentation

## 2021-01-08 ENCOUNTER — Other Ambulatory Visit: Payer: Self-pay | Admitting: Internal Medicine

## 2021-01-11 ENCOUNTER — Inpatient Hospital Stay: Payer: Medicare HMO | Attending: Oncology

## 2021-01-11 ENCOUNTER — Other Ambulatory Visit: Payer: Self-pay

## 2021-01-11 ENCOUNTER — Inpatient Hospital Stay: Payer: Medicare HMO

## 2021-01-11 ENCOUNTER — Inpatient Hospital Stay (HOSPITAL_BASED_OUTPATIENT_CLINIC_OR_DEPARTMENT_OTHER): Payer: Medicare HMO | Admitting: Nurse Practitioner

## 2021-01-11 ENCOUNTER — Encounter: Payer: Self-pay | Admitting: Nurse Practitioner

## 2021-01-11 VITALS — BP 120/82 | HR 64 | Temp 97.9°F | Resp 16 | Ht 65.0 in | Wt 343.0 lb

## 2021-01-11 DIAGNOSIS — M797 Fibromyalgia: Secondary | ICD-10-CM | POA: Insufficient documentation

## 2021-01-11 DIAGNOSIS — E538 Deficiency of other specified B group vitamins: Secondary | ICD-10-CM

## 2021-01-11 DIAGNOSIS — K760 Fatty (change of) liver, not elsewhere classified: Secondary | ICD-10-CM | POA: Insufficient documentation

## 2021-01-11 DIAGNOSIS — M069 Rheumatoid arthritis, unspecified: Secondary | ICD-10-CM | POA: Insufficient documentation

## 2021-01-11 DIAGNOSIS — Z79899 Other long term (current) drug therapy: Secondary | ICD-10-CM | POA: Diagnosis not present

## 2021-01-11 DIAGNOSIS — K219 Gastro-esophageal reflux disease without esophagitis: Secondary | ICD-10-CM | POA: Insufficient documentation

## 2021-01-11 DIAGNOSIS — E039 Hypothyroidism, unspecified: Secondary | ICD-10-CM | POA: Insufficient documentation

## 2021-01-11 DIAGNOSIS — D509 Iron deficiency anemia, unspecified: Secondary | ICD-10-CM

## 2021-01-11 DIAGNOSIS — M810 Age-related osteoporosis without current pathological fracture: Secondary | ICD-10-CM | POA: Diagnosis not present

## 2021-01-11 DIAGNOSIS — Z9884 Bariatric surgery status: Secondary | ICD-10-CM | POA: Diagnosis not present

## 2021-01-11 LAB — CBC WITH DIFFERENTIAL/PLATELET
Abs Immature Granulocytes: 0.01 10*3/uL (ref 0.00–0.07)
Basophils Absolute: 0 10*3/uL (ref 0.0–0.1)
Basophils Relative: 1 %
Eosinophils Absolute: 0.2 10*3/uL (ref 0.0–0.5)
Eosinophils Relative: 4 %
HCT: 39.9 % (ref 36.0–46.0)
Hemoglobin: 13 g/dL (ref 12.0–15.0)
Immature Granulocytes: 0 %
Lymphocytes Relative: 37 %
Lymphs Abs: 1.7 10*3/uL (ref 0.7–4.0)
MCH: 29.4 pg (ref 26.0–34.0)
MCHC: 32.6 g/dL (ref 30.0–36.0)
MCV: 90.3 fL (ref 80.0–100.0)
Monocytes Absolute: 0.5 10*3/uL (ref 0.1–1.0)
Monocytes Relative: 10 %
Neutro Abs: 2.2 10*3/uL (ref 1.7–7.7)
Neutrophils Relative %: 48 %
Platelets: 202 10*3/uL (ref 150–400)
RBC: 4.42 MIL/uL (ref 3.87–5.11)
RDW: 12.8 % (ref 11.5–15.5)
WBC: 4.6 10*3/uL (ref 4.0–10.5)
nRBC: 0 % (ref 0.0–0.2)

## 2021-01-11 MED ORDER — CYANOCOBALAMIN 1000 MCG/ML IJ SOLN
1000.0000 ug | Freq: Once | INTRAMUSCULAR | Status: AC
Start: 2021-01-11 — End: 2021-01-11
  Administered 2021-01-11: 1000 ug via INTRAMUSCULAR
  Filled 2021-01-11: qty 1

## 2021-01-11 NOTE — Progress Notes (Signed)
Pt still fatigued, but no better or worse. She has arthritis so that is always some pain in her body

## 2021-01-11 NOTE — Progress Notes (Signed)
Hematology/Oncology Progress Note [facility]  [phone] [phone]  Patient Care Team: Lavera Guise, MD as PCP - General (Internal Medicine)   Name of the patient: Jamie Bennett  [id number]  August 20, 1960   Date of visit: 01/11/21  Diagnosis-iron and B12 deficiency anemia  Chief complaint/ Reason for visit-routine follow-up of anemia  Heme/Onc history: [name] with history of gastric bypass surgery in 2003.  She is subsequent developed iron and B12 deficiency and has been getting IV iron from time to time.  She was previously seen Dr. Mike Gip and has transferred care to Dr. Janese Banks  Interval history-patient returns to clinic for routine follow-up for history of iron and B12 anemia secondary to gastric bypass surgery in 2003.  She reports ongoing fatigue.  No recent hospitalizations.  Does not denies blood in her stool or urine.  Has not been taking oral iron.  Continues to have occasional bruising over her forearms.  No pica, restless leg, shortness of breath.  ECOG PS- 1 Pain scale- 0  Review of systems- Review of Systems  Constitutional:  Positive for malaise/fatigue. Negative for chills, fever and weight loss.  HENT:  Negative for congestion, ear discharge and nosebleeds.   Eyes:  Negative for blurred vision.  Respiratory:  Negative for cough, hemoptysis, sputum production, shortness of breath and wheezing.   Cardiovascular:  Negative for chest pain, palpitations, orthopnea and claudication.  Gastrointestinal:  Negative for abdominal pain, blood in stool, constipation, diarrhea, heartburn, melena, nausea and vomiting.  Genitourinary:  Negative for dysuria, flank pain, frequency, hematuria and urgency.  Musculoskeletal:  Negative for back pain, joint pain and myalgias.  Skin:  Negative for rash.  Neurological:  Negative for dizziness, tingling, focal weakness, seizures, weakness and headaches.   Endo/Heme/Allergies:  Does not bruise/bleed easily.  Psychiatric/Behavioral:  Negative for depression and suicidal ideas. The patient does not have insomnia.      Allergies  Allergen Reactions   Lactose Intolerance (Gi) Other (See Comments)    Bloating and GI distress   Sulfa Antibiotics Hives    Past Medical History:  Diagnosis Date   Anemia    Anxiety    Bipolar 1 disorder (HCC)    Collagen vascular disease (HCC)    rhematoid arthritis   Constipation    Fibromyalgia    GERD (gastroesophageal reflux disease)    Heart murmur    mild-asymptomatic   Hepatic steatosis    History of Roux-en-Y gastric bypass    Hypotension    Hypothyroidism    Opioid abuse ([place])    Osteoarthritis    Osteoporosis    PONV (postoperative nausea and vomiting)    nausea only   Rheumatic fever    Rheumatoid arthritis ([place])    Thyroid disease     Past Surgical History:  Procedure Laterality Date   CHOLECYSTECTOMY  2004   COLONOSCOPY N/A 07/16/2017   Procedure: COLONOSCOPY;  Surgeon: Manya Silvas, MD;  Location: [facility] ENDOSCOPY;  Service: Endoscopy;  Laterality: N/A;   EXPLORATORY LAPAROTOMY  2009   GASTRIC BYPASS  2003   [place] N/A 03/01/2018   Procedure: EXPLORATORY LAPAROTOMY/ UMBILECTOMY;  Surgeon: Robert Bellow, MD;  Location: [facility] ORS;  Service: General;  Laterality: N/A;   TONSILLECTOMY     VENTRAL HERNIA REPAIR N/A 03/07/2018   Procedure: HERNIA REPAIR VENTRAL ADULT;  Surgeon: Robert Bellow, MD;  Location: [facility] ORS;  Service: General;  Laterality: N/A;  Social History   Socioeconomic History   Marital status: Married    Spouse name: Not on file   Number of children: Not on file   Years of education: Not on file   Highest education level: Not on file  Occupational History   Not on file  Tobacco Use   Smoking status: Never   Smokeless tobacco: Never  Vaping Use   Vaping Use: Never used  Substance and Sexual Activity   Alcohol use: No     Alcohol/week: 0.0 standard drinks   Drug use: No   Sexual activity: Yes  Other Topics Concern   Not on file  Social History Narrative   Not on file   Social Determinants of Health   Financial Resource Strain: Not on file  Food Insecurity: Not on file  Transportation Needs: Not on file  Physical Activity: Not on file  Stress: Not on file  Social Connections: Not on file  Intimate Partner Violence: Not on file    Family History  Problem Relation Age of Onset   Depression Mother    Dementia Mother    Heart disease Mother    Osteoporosis Mother    Parkinson's disease Father    Colon cancer Neg Hx      Current Outpatient Medications:    busPIRone (BUSPAR) 30 MG tablet, 1 bid, Disp: 180 tablet, Rfl: 0   Cholecalciferol 100 MCG (4000 UT) CAPS, Take 4,000 Units by mouth daily. , Disp: , Rfl:    clonazePAM (KLONOPIN) 0.5 MG tablet, TAKE 1 TABLET BY MOUTH ONCE DAILY AS NEEDED FOR  ACUTE  ANXIETY  EMERGENCIES  ONLY  DO  NOT  EXCEED  ONE  PER  DAY, Disp: 30 tablet, Rfl: 3   Coenzyme Q-10 200 MG CAPS, Take 200 mg by mouth 2 (two) times daily. , Disp: , Rfl:    cyanocobalamin (,VITAMIN B-12,) 1000 MCG/ML injection, Inject 1,000 mcg into the muscle every 30 (thirty) days. , Disp: , Rfl:    denosumab (PROLIA) 60 MG/ML SOSY injection, Inject 60 mg into the skin every 6 (six) months., Disp: , Rfl:    desipramine (NORPRAMIN) 25 MG tablet, Take 1 tablet (25 mg total) by mouth daily., Disp: 90 tablet, Rfl: 2   esomeprazole (NEXIUM) 20 MG capsule, Take 20 mg by mouth daily. , Disp: , Rfl:    HYDROcodone-acetaminophen (NORCO/VICODIN) 5-325 MG tablet, Take 1 tablet by mouth at bedtime as needed for moderate pain. (Patient not taking: Reported on 12/21/2020), Disp: 10 tablet, Rfl: 0   hydroxychloroquine (PLAQUENIL) 200 MG tablet, TAKE ONE TABLET BY MOUTH TWICE A DAY, Disp: 180 tablet, Rfl: 0   hydrOXYzine (ATARAX/VISTARIL) 50 MG tablet, 1  Tid   1  Prn q day, Disp: 360 tablet, Rfl: 1   Krill Oil 500  MG CAPS, Take 500 mg by mouth daily., Disp: , Rfl:    lamoTRIgine (LAMICTAL) 150 MG tablet, Take 1 tablet (150 mg total) by mouth 2 (two) times daily., Disp: 180 tablet, Rfl: 2   levothyroxine (SYNTHROID) 112 MCG tablet, Take 1 tablet (112 mcg total) by mouth daily., Disp: 90 tablet, Rfl: 1   loratadine (CLARITIN) 10 MG tablet, Take 10 mg by mouth daily as needed for allergies., Disp: , Rfl:    naproxen (NAPROSYN) 500 MG tablet, Take 500 mg by mouth 2 (two) times daily as needed., Disp: , Rfl:    polyethylene glycol (MIRALAX) packet, Take 17 g by mouth daily. (Patient taking differently: Take 17 g by mouth  as needed.), Disp: 14 each, Rfl: 0   sucralfate (CARAFATE) 1 g tablet, Take 1 tablet (1 g total) by mouth 2 (two) times daily., Disp: 180 tablet, Rfl: 0   tiZANidine (ZANAFLEX) 4 MG tablet, TAKE 1 TABLET BY MOUTH THREE TIMES DAILY, Disp: 90 tablet, Rfl: 0   traZODone (DESYREL) 100 MG tablet, Take 1 tablet (100 mg total) by mouth at bedtime as needed and may repeat dose one time if needed for sleep., Disp: 60 tablet, Rfl: 5   venlafaxine (EFFEXOR) 100 MG tablet, Take 1 tablet (100 mg total) by mouth 3 (three) times daily with meals., Disp: 270 tablet, Rfl: 2  Physical exam:  Vitals:   01/11/21 1127  BP: 120/82  Pulse: 64  Resp: 16  Temp: 97.9 F (36.6 C)  TempSrc: Tympanic  Weight: (!) 343 lb (155.6 kg)  Height: 5\' 5"  (1.651 m)   Physical Exam Constitutional:      Appearance: She is obese.  HENT:     Head: Normocephalic and atraumatic.  Eyes:     Pupils: Pupils are equal, round, and reactive to light.  Cardiovascular:     Rate and Rhythm: Normal rate and regular rhythm.     Heart sounds: Normal heart sounds.  Pulmonary:     Effort: Pulmonary effort is normal.     Breath sounds: Normal breath sounds.  Abdominal:     General: Bowel sounds are normal.     Palpations: Abdomen is soft.  Musculoskeletal:     Cervical back: Normal range of motion.  Skin:    Findings: Bruising  present.     Comments: Staining on right antecubital area secondary to prior infiltration during iron infusion  Neurological:     Mental Status: She is alert and oriented to person, place, and time.  Psychiatric:        Mood and Affect: Mood normal.        Behavior: Behavior normal.     CMP Latest Ref Rng & Units 05/26/2020  Glucose 65 - 99 mg/dL 84  BUN 8 - 27 mg/dL 14  Creatinine 0.57 - 1.00 mg/dL 0.76  Sodium 134 - 144 mmol/L 143  Potassium 3.5 - 5.2 mmol/L 3.8  Chloride 96 - 106 mmol/L 105  CO2 20 - 29 mmol/L 21  Calcium 8.7 - 10.3 mg/dL 9.2  Total Protein 6.0 - 8.5 g/dL 6.2  Total Bilirubin 0.0 - 1.2 mg/dL 0.3  Alkaline Phos 44 - 121 IU/L 115  AST 0 - 40 IU/L 21  ALT 0 - 32 IU/L 19   CBC Latest Ref Rng & Units 01/11/2021  WBC 4.0 - 10.5 K/uL 4.6  Hemoglobin 12.0 - 15.0 g/dL 13.0  Hematocrit 36.0 - 46.0 % 39.9  Platelets 150 - 400 K/uL 202    No images are attached to the encounter.  DG Chest 2 View  Result Date: 12/29/2020 CLINICAL DATA:  Shortness of breath recent left rib fractures 2 months ago EXAM: CHEST - 2 VIEW COMPARISON:  05/11/2017 FINDINGS: The heart size and mediastinal contours are within normal limits. Both lungs are clear. The visualized skeletal structures are unremarkable. IMPRESSION: Stable exam.  No active chest disease. Electronically Signed   By: Jerilynn Mages.  Shick M.D.   On: 12/29/2020 09:17      Assessment and plan- Patient is a 60 y.o. female B12 deficiency anemia secondary to gastric bypass who returns to routine follow-up.   Hemoglobin today is 13.  Improved. Last feraheme in May 2020. Iron studies are pending  at time of dictation.  No microcytosis.  Normocytic. Patient can trial oral iron supplementation though I suspect secondary to malabsorption that she may not tolerate or have a effect.  Counseled her regarding formulations.  No IV iron today.  Continue to monitor.  B12 deficiency-currently receiving monthly B12 injections.  Malabsorption secondary  to gastric bypass.  Continue monthly B12 shots.  Check B12 annually.  Next in November.  Plan: B12 today B12 monthly x 6 Rtc in 3 months for labs (cbc, ferritin, iron studies) 6 mo- lab (cbc, ferritin, iron studies), NP/MD   Visit Diagnosis 1. Vitamin B 12 deficiency   2. Iron deficiency anemia, unspecified iron deficiency anemia type    Beckey Rutter, DNP, AGNP-C [place] at [facility] [phone] (clinic) 01/11/2021 12:11 PM

## 2021-01-12 LAB — IRON AND TIBC
Iron: 76 ug/dL (ref 28–170)
Saturation Ratios: 17 % (ref 10.4–31.8)
TIBC: 458 ug/dL — ABNORMAL HIGH (ref 250–450)
UIBC: 382 ug/dL

## 2021-01-12 LAB — FERRITIN: Ferritin: 59 ng/mL (ref 11–307)

## 2021-01-17 DIAGNOSIS — E559 Vitamin D deficiency, unspecified: Secondary | ICD-10-CM | POA: Diagnosis not present

## 2021-01-17 DIAGNOSIS — M810 Age-related osteoporosis without current pathological fracture: Secondary | ICD-10-CM | POA: Diagnosis not present

## 2021-01-27 DIAGNOSIS — M810 Age-related osteoporosis without current pathological fracture: Secondary | ICD-10-CM | POA: Diagnosis not present

## 2021-02-04 ENCOUNTER — Other Ambulatory Visit: Payer: Self-pay

## 2021-02-04 ENCOUNTER — Telehealth: Payer: Self-pay

## 2021-02-04 MED ORDER — CIPROFLOXACIN HCL 500 MG PO TABS: 500.0000 mg | ORAL_TABLET | Freq: Two times a day (BID) | ORAL | 0 refills | Status: DC

## 2021-02-04 NOTE — Telephone Encounter (Signed)
Pt called that she is having UTI symptoms,pain when urinated and burning as per dfk send cipro  advised if she not feeling need appt

## 2021-02-09 ENCOUNTER — Other Ambulatory Visit: Payer: Self-pay | Admitting: Physician Assistant

## 2021-02-09 DIAGNOSIS — M0579 Rheumatoid arthritis with rheumatoid factor of multiple sites without organ or systems involvement: Secondary | ICD-10-CM

## 2021-02-09 NOTE — Telephone Encounter (Signed)
Last Visit: 12/21/2020  Next Visit: 05/24/2021  Labs: 01/17/2021 Chloride 111  Eye exam: 09/08/2020 WNL   Current Dose per office note 12/21/2020: Plaquenil 200 mg 1 tablet by mouth twice daily.  DX: Rheumatoid arthritis involving multiple sites with positive rheumatoid factor  Last Fill: 11/15/2020  Okay to refill Plaquenil?

## 2021-02-15 ENCOUNTER — Inpatient Hospital Stay: Payer: Medicare HMO | Attending: Oncology

## 2021-02-15 DIAGNOSIS — Z79899 Other long term (current) drug therapy: Secondary | ICD-10-CM | POA: Diagnosis not present

## 2021-02-15 DIAGNOSIS — E538 Deficiency of other specified B group vitamins: Secondary | ICD-10-CM | POA: Insufficient documentation

## 2021-02-15 MED ORDER — CYANOCOBALAMIN 1000 MCG/ML IJ SOLN
1000.0000 ug | Freq: Once | INTRAMUSCULAR | Status: AC
  Administered 2021-02-15: 1000 ug via INTRAMUSCULAR
  Filled 2021-02-15: qty 1

## 2021-02-20 ENCOUNTER — Other Ambulatory Visit: Payer: Self-pay | Admitting: Internal Medicine

## 2021-02-22 ENCOUNTER — Other Ambulatory Visit: Payer: Self-pay

## 2021-02-22 ENCOUNTER — Ambulatory Visit (INDEPENDENT_AMBULATORY_CARE_PROVIDER_SITE_OTHER): Payer: Medicare HMO | Admitting: Psychiatry

## 2021-02-22 DIAGNOSIS — R69 Illness, unspecified: Secondary | ICD-10-CM | POA: Diagnosis not present

## 2021-02-22 DIAGNOSIS — F411 Generalized anxiety disorder: Secondary | ICD-10-CM | POA: Diagnosis not present

## 2021-02-22 DIAGNOSIS — F332 Major depressive disorder, recurrent severe without psychotic features: Secondary | ICD-10-CM | POA: Diagnosis not present

## 2021-02-22 DIAGNOSIS — F325 Major depressive disorder, single episode, in full remission: Secondary | ICD-10-CM

## 2021-02-22 DIAGNOSIS — F603 Borderline personality disorder: Secondary | ICD-10-CM | POA: Diagnosis not present

## 2021-02-22 MED ORDER — VENLAFAXINE HCL 100 MG PO TABS: 100.0000 mg | ORAL_TABLET | Freq: Three times a day (TID) | ORAL | 2 refills | Status: DC

## 2021-02-22 MED ORDER — BUSPIRONE HCL 30 MG PO TABS
ORAL_TABLET | ORAL | 0 refills | Status: DC
Start: 2021-02-22 — End: 2021-05-24

## 2021-02-22 MED ORDER — HYDROXYZINE HCL 50 MG PO TABS: ORAL_TABLET | ORAL | 1 refills | Status: DC

## 2021-02-22 MED ORDER — LAMOTRIGINE 150 MG PO TABS: 150.0000 mg | ORAL_TABLET | Freq: Two times a day (BID) | ORAL | 2 refills | Status: DC

## 2021-02-22 MED ORDER — CLONAZEPAM 0.5 MG PO TABS: ORAL_TABLET | ORAL | 3 refills | Status: DC

## 2021-02-22 MED ORDER — TRAZODONE HCL 100 MG PO TABS: 100.0000 mg | ORAL_TABLET | Freq: Every evening | ORAL | 5 refills | Status: DC | PRN

## 2021-02-22 MED ORDER — DESIPRAMINE HCL 25 MG PO TABS: 25.0000 mg | ORAL_TABLET | Freq: Every day | ORAL | 2 refills | Status: DC

## 2021-02-22 NOTE — Progress Notes (Signed)
`  Psychiatric Initial Adult Assessment   Patient Identification: Jamie Bennett MRN:  [id number] Date of Evaluation:  02/22/2021 Referral Source: grams per previous psychiatrist Chief Complaint:   Visit Diagnosis: borderline personality disorder   ICD-10-CM   1. Anxiety state  F41.1 busPIRone (BUSPAR) 30 MG tablet    clonazePAM (KLONOPIN) 0.5 MG tablet    venlafaxine (EFFEXOR) 100 MG tablet    hydrOXYzine (ATARAX/VISTARIL) 50 MG tablet    2. Borderline personality disorder ([place])  F60.3 venlafaxine (EFFEXOR) 100 MG tablet    traZODone (DESYREL) 100 MG tablet    lamoTRIgine (LAMICTAL) 150 MG tablet    3. Major depressive disorder, recurrent, severe without psychotic features (HCC)  F33.2 venlafaxine (EFFEXOR) 100 MG tablet    traZODone (DESYREL) 100 MG tablet    lamoTRIgine (LAMICTAL) 150 MG tablet      History of Present Illness:  Today the patient is doing fairly well.  She is still dealing with her mother-in-law all who is very demented in a nursing home.  Her husband travel an hour to get to the facility twice a week.  The patient describes episodic unexplainable anxiety.  She uses DBT techniques and deep breathing and seems to get through it.  Her husband is having a particularly hard time dealing with his mother who is quite demented.  This patient is sleeping and eating fairly well.  She uses no alcohol and uses no drugs.  Patient takes all her medicines as prescribed.  She does not have hypertension or diabetes.  The patient is considering getting a cat.  Patient says using Vistaril as needed when she is going to visit her mother-in-law seems to be helpful.  Today we reviewed all her medications and the benefits.  Overall the patient's physical health is pretty good.  Financially she is stating.  She is functioning actually well.  Unfortunately she has not had time to reach out to the therapist that I gave her.  She is interested in doing DBT and she needs a special therapist.   She will not give up.     Depression Symptoms:  fatigue, (Hypo) Manic Symptoms:   Anxiety Symptoms:   Psychotic Symptoms:   PTSD Symptoms:   Past Psychiatric History: 10 psychiatric hospitalizations multiple psychotropic medications presently in psychotherapy  Previous Psychotropic Medications: Yes   Substance Abuse History in the last 12 months:  Yes.    Consequences of Substance Abuse:   Past Medical History:  Past Medical History:  Diagnosis Date   Anemia    Anxiety    Bipolar 1 disorder ([place])    Collagen vascular disease ([place])    rhematoid arthritis   Constipation    Fibromyalgia    GERD (gastroesophageal reflux disease)    Heart murmur    mild-asymptomatic   Hepatic steatosis    History of Roux-en-Y gastric bypass    Hypotension    Hypothyroidism    Opioid abuse ([place])    Osteoarthritis    Osteoporosis    PONV (postoperative nausea and vomiting)    nausea only   Rheumatic fever    Rheumatoid arthritis ([place])    Thyroid disease     Past Surgical History:  Procedure Laterality Date   CHOLECYSTECTOMY  2004   COLONOSCOPY N/A 07/16/2017   Procedure: COLONOSCOPY;  Surgeon: Manya Silvas, MD;  [facility] ENDOSCOPY;  Service: Endoscopy;  Laterality: N/A;   EXPLORATORY LAPAROTOMY  2009   GASTRIC BYPASS  2003   [place]  N/A 03/01/2018   Procedure: EXPLORATORY LAPAROTOMY/ UMBILECTOMY;  Surgeon: Robert Bellow, MD;  Location: [facility] ORS;  Service: General;  Laterality: N/A;   TONSILLECTOMY     VENTRAL HERNIA REPAIR N/A 03/07/2018   Procedure: HERNIA REPAIR VENTRAL ADULT;  Surgeon: Robert Bellow, MD;  Location: [facility] ORS;  Service: General;  Laterality: N/A;    Family Psychiatric History:   Family History:  Family History  Problem Relation Age of Onset   Depression Mother    Dementia Mother    Heart disease Mother    Osteoporosis Mother    Parkinson's disease Father    Colon cancer Neg Hx     Social History:   Social  History   Socioeconomic History   Marital status: Married    Spouse name: Not on file   Number of children: Not on file   Years of education: Not on file   Highest education level: Not on file  Occupational History   Not on file  Tobacco Use   Smoking status: Never   Smokeless tobacco: Never  Vaping Use   Vaping Use: Never used  Substance and Sexual Activity   Alcohol use: Yes    Comment: occasional   Drug use: No   Sexual activity: Yes  Other Topics Concern   Not on file  Social History Narrative   Not on file   Social Determinants of Health   Financial Resource Strain: Not on file  Food Insecurity: Not on file  Transportation Needs: Not on file  Physical Activity: Not on file  Stress: Not on file  Social Connections: Not on file    Additional Social History:   Allergies:   Allergies  Allergen Reactions   Lactose Intolerance (Gi) Other (See Comments)    Bloating and GI distress   Sulfa Antibiotics Hives    Metabolic Disorder Labs: No results found for: HGBA1C, MPG No results found for: PROLACTIN Lab Results  Component Value Date   CHOL 169 05/26/2020   TRIG 136 05/26/2020   HDL 73 05/26/2020   LDLCALC 73 05/26/2020     Current Medications: Current Outpatient Medications  Medication Sig Dispense Refill   aspirin EC 81 MG tablet Take 81 mg by mouth daily. Swallow whole.     busPIRone (BUSPAR) 30 MG tablet 1 bid 180 tablet 0   Cholecalciferol 125 MCG (5000 UT) TABS Take 1 tablet by mouth daily.     ciprofloxacin (CIPRO) 500 MG tablet Take 1 tablet (500 mg total) by mouth 2 (two) times daily. 14 tablet 0   clonazePAM (KLONOPIN) 0.5 MG tablet TAKE 1 TABLET BY MOUTH ONCE DAILY AS NEEDED FOR  ACUTE  ANXIETY  EMERGENCIES  ONLY  DO  NOT  EXCEED  ONE  PER  DAY 30 tablet 3   Coenzyme Q10 300 MG CAPS Take by mouth.     cyanocobalamin (,VITAMIN B-12,) 1000 MCG/ML injection Inject 1,000 mcg into the muscle every 30 (thirty) days.      denosumab (PROLIA) 60 MG/ML  SOSY injection Inject 60 mg into the skin every 6 (six) months.     desipramine (NORPRAMIN) 25 MG tablet Take 1 tablet (25 mg total) by mouth daily. 90 tablet 2   esomeprazole (NEXIUM) 20 MG capsule Take 20 mg by mouth daily.      hydroxychloroquine (PLAQUENIL) 200 MG tablet TAKE ONE TABLET BY MOUTH TWICE A DAY 180 tablet 0   hydrOXYzine (ATARAX/VISTARIL) 50 MG tablet 1  Tid   1  Prn  q day 360 tablet 1   Krill Oil 500 MG CAPS Take 500 mg by mouth daily.     lamoTRIgine (LAMICTAL) 150 MG tablet Take 1 tablet (150 mg total) by mouth 2 (two) times daily. 180 tablet 2   levothyroxine (SYNTHROID) 112 MCG tablet Take 1 tablet (112 mcg total) by mouth daily. 90 tablet 1   loratadine (CLARITIN) 10 MG tablet Take 10 mg by mouth daily as needed for allergies. (Patient not taking: Reported on 01/11/2021)     magnesium oxide (MAG-OX) 400 MG tablet Take 400 mg by mouth daily.     Multiple Vitamin (MULTIVITAMIN) tablet Take 1 tablet by mouth daily.     polyethylene glycol (MIRALAX) packet Take 17 g by mouth daily. (Patient taking differently: Take 17 g by mouth as needed.) 14 each 0   psyllium (METAMUCIL) 58.6 % packet Take 1 packet by mouth daily.     sucralfate (CARAFATE) 1 g tablet Take 1 tablet (1 g total) by mouth 2 (two) times daily. 180 tablet 0   tiZANidine (ZANAFLEX) 4 MG tablet TAKE 1 TABLET BY MOUTH THREE TIMES DAILY 90 tablet 0   traZODone (DESYREL) 100 MG tablet Take 1 tablet (100 mg total) by mouth at bedtime as needed and may repeat dose one time if needed for sleep. 60 tablet 5   venlafaxine (EFFEXOR) 100 MG tablet Take 1 tablet (100 mg total) by mouth 3 (three) times daily with meals. 270 tablet 2   vitamin C (ASCORBIC ACID) 500 MG tablet Take 500 mg by mouth daily.     zinc gluconate 50 MG tablet Take 50 mg by mouth daily.     No current facility-administered medications for this visit.    Neurologic: Headache: No Seizure: No Paresthesias:NA  Musculoskeletal: Strength & Muscle Tone:  within normal limits Gait & Station: normal Patient leans: N/A  Psychiatric Specialty Exam: ROS  There were no vitals taken for this visit.There is no height or weight on file to calculate BMI.  General Appearance: Bizarre  Eye Contact:  Good  Speech:  Normal Rate  Volume:  Normal  Mood:  Anxious  Affect:  Congruent  Thought Process:  Goal Directed  Orientation:  NA  Thought Content:  Logical  Suicidal Thoughts:  No  Homicidal Thoughts:  No  Memory:  Negative  Judgement:  Fair  Insight:  NA and Good  Psychomotor Activity:  Normal  Concentration:    Recall:  Good  Fund of Knowledge:  Language: Good  Akathisia:  No  Handed:  Right  AIMS (if indicated):    Assets:  Desire for Improvement  ADL's:  Intact  Cognition: WNL  Sleep:      Treatment Plan Summary:  This patient's first problem seems to be that of major depression.  She takes Effexor 100 mg 3 in the morning.  The patient also takes desipramine 25 mg 1 in the morning.  Her second problem is an adjustment disorder with an anxious mood state.  This seems to be pretty well controlled with Vistaril 50 mg 3 times daily and 1 as needed.  She also takes a low-dose of Klonopin as well.  Her third problem is that of borderline personality disorder.  Specifically she has mood instability which is actually fairly well controlled with Lamictal.  Her fourth problem is that of insomnia.  The patient will continue taking trazodone for this.  This combination of psychotropic medications have been very helpful.  The patient is stable at this time.  She will still be looking for a therapist.  She will return to see me in 4 months. 7/26/20223:38 PM

## 2021-02-24 ENCOUNTER — Other Ambulatory Visit: Payer: Self-pay

## 2021-02-24 MED ORDER — TIZANIDINE HCL 4 MG PO TABS
4.0000 mg | ORAL_TABLET | Freq: Three times a day (TID) | ORAL | 0 refills | Status: DC
Start: 2021-02-24 — End: 2021-03-23

## 2021-03-08 ENCOUNTER — Inpatient Hospital Stay: Payer: Medicare HMO | Attending: Oncology

## 2021-03-08 DIAGNOSIS — Z79899 Other long term (current) drug therapy: Secondary | ICD-10-CM | POA: Diagnosis not present

## 2021-03-08 DIAGNOSIS — E538 Deficiency of other specified B group vitamins: Secondary | ICD-10-CM | POA: Diagnosis not present

## 2021-03-08 MED ORDER — CYANOCOBALAMIN 1000 MCG/ML IJ SOLN
1000.0000 ug | Freq: Once | INTRAMUSCULAR | Status: AC
  Administered 2021-03-08: 1000 ug via INTRAMUSCULAR
  Filled 2021-03-08: qty 1

## 2021-03-23 ENCOUNTER — Other Ambulatory Visit: Payer: Self-pay

## 2021-03-23 ENCOUNTER — Ambulatory Visit (INDEPENDENT_AMBULATORY_CARE_PROVIDER_SITE_OTHER): Payer: Medicare HMO | Admitting: Nurse Practitioner

## 2021-03-23 ENCOUNTER — Encounter: Payer: Self-pay | Admitting: Nurse Practitioner

## 2021-03-23 ENCOUNTER — Telehealth: Payer: Self-pay

## 2021-03-23 ENCOUNTER — Other Ambulatory Visit: Payer: Self-pay | Admitting: Nurse Practitioner

## 2021-03-23 VITALS — BP 127/66 | HR 73 | Temp 97.5°F | Resp 16 | Ht 65.0 in | Wt 346.4 lb

## 2021-03-23 DIAGNOSIS — R0789 Other chest pain: Secondary | ICD-10-CM | POA: Diagnosis not present

## 2021-03-23 DIAGNOSIS — M542 Cervicalgia: Secondary | ICD-10-CM

## 2021-03-23 DIAGNOSIS — B354 Tinea corporis: Secondary | ICD-10-CM | POA: Diagnosis not present

## 2021-03-23 DIAGNOSIS — Z23 Encounter for immunization: Secondary | ICD-10-CM

## 2021-03-23 DIAGNOSIS — Z79899 Other long term (current) drug therapy: Secondary | ICD-10-CM

## 2021-03-23 DIAGNOSIS — G6189 Other inflammatory polyneuropathies: Secondary | ICD-10-CM | POA: Diagnosis not present

## 2021-03-23 MED ORDER — TIZANIDINE HCL 4 MG PO TABS: 4.0000 mg | ORAL_TABLET | Freq: Three times a day (TID) | ORAL | 0 refills | Status: DC

## 2021-03-23 MED ORDER — KETOCONAZOLE 2 % EX CREA: 1.0000 "application " | TOPICAL_CREAM | Freq: Every day | CUTANEOUS | 2 refills | Status: DC

## 2021-03-23 MED ORDER — TERBINAFINE HCL 250 MG PO TABS: 250.0000 mg | ORAL_TABLET | Freq: Every day | ORAL | 0 refills | Status: DC

## 2021-03-23 MED ORDER — GABAPENTIN 100 MG PO CAPS: 100.0000 mg | ORAL_CAPSULE | Freq: Three times a day (TID) | ORAL | 0 refills | Status: DC

## 2021-03-23 MED ORDER — NITROGLYCERIN 0.4 MG SL SUBL: 0.4000 mg | SUBLINGUAL_TABLET | SUBLINGUAL | 12 refills | Status: DC | PRN

## 2021-03-23 MED ORDER — ZOSTER VAC RECOMB ADJUVANTED 50 MCG/0.5ML IM SUSR: 0.5000 mL | Freq: Once | INTRAMUSCULAR | 0 refills | Status: AC

## 2021-03-23 MED ORDER — PNEUMOCOCCAL 20-VAL CONJ VACC 0.5 ML IM SUSY: 0.5000 mL | PREFILLED_SYRINGE | INTRAMUSCULAR | 0 refills | Status: AC

## 2021-03-23 MED ORDER — TETANUS-DIPHTH-ACELL PERTUSSIS 5-2.5-18.5 LF-MCG/0.5 IM SUSP: 0.5000 mL | Freq: Once | INTRAMUSCULAR | 0 refills | Status: AC

## 2021-03-23 NOTE — Progress Notes (Signed)
[facility] [place], [place] [address]  Internal MEDICINE  Office Visit Note  Patient Name: Jamie Bennett  [id number]  [id number]  Date of Service: 03/23/2021  Chief Complaint  Patient presents with   Follow-up    Refills, upper arm rash left inside, sometimes itchy or uncomfortable, tingling in hands and feel, feels kind of like getting shocked, hands shake in the morning, but after an hour trembling subsides unless pt uses hands for any length of time   Gastroesophageal Reflux   Anxiety   Anemia    HPI Jamie Bennett presents for a follow up visit for medication refills. She has a rash on the medial left upper arm that is itchy. She reports feeling tingling in her hands and feet. Hands are shaky in the morning for about the first hour after waking.  She also experiencing chest pain that feels like a pressure, dully achy pain. This pain comes and goes and usually only lasts for a few minutes, usually uses OTC NSAIDs. No pain at this time. Sometimes rest helps the pain go away.    Current Medication: Outpatient Encounter Medications as of 03/23/2021  Medication Sig   busPIRone (BUSPAR) 30 MG tablet 1 bid   Cholecalciferol 125 MCG (5000 UT) TABS Take 1 tablet by mouth daily.   clonazePAM (KLONOPIN) 0.5 MG tablet TAKE 1 TABLET BY MOUTH ONCE DAILY AS NEEDED FOR  ACUTE  ANXIETY  EMERGENCIES  ONLY  DO  NOT  EXCEED  ONE  PER  DAY   Coenzyme Q10 300 MG CAPS Take by mouth.   cyanocobalamin (,VITAMIN B-12,) 1000 MCG/ML injection Inject 1,000 mcg into the muscle every 30 (thirty) days.    denosumab (PROLIA) 60 MG/ML SOSY injection Inject 60 mg into the skin every 6 (six) months.   desipramine (NORPRAMIN) 25 MG tablet Take 1 tablet (25 mg total) by mouth daily.   esomeprazole (NEXIUM) 20 MG capsule Take 20 mg by mouth daily.    gabapentin (NEURONTIN) 100 MG capsule Take 1 capsule (100 mg total) by mouth 3 (three) times daily.   hydroxychloroquine (PLAQUENIL) 200 MG  tablet TAKE ONE TABLET BY MOUTH TWICE A DAY   hydrOXYzine (ATARAX/VISTARIL) 50 MG tablet 1  Tid   1  Prn q day   ketoconazole (NIZORAL) 2 % cream Apply 1 application topically daily. To affected area For 6 weeks or until rash resolves.   Krill Oil 500 MG CAPS Take 500 mg by mouth daily.   lamoTRIgine (LAMICTAL) 150 MG tablet Take 1 tablet (150 mg total) by mouth 2 (two) times daily.   levothyroxine (SYNTHROID) 112 MCG tablet Take 1 tablet (112 mcg total) by mouth daily.   loratadine (CLARITIN) 10 MG tablet Take 10 mg by mouth daily as needed for allergies.   magnesium oxide (MAG-OX) 400 MG tablet Take 400 mg by mouth daily.   Multiple Vitamin (MULTIVITAMIN) tablet Take 1 tablet by mouth daily.   nitroGLYCERIN (NITROSTAT) 0.4 MG SL tablet Place 1 tablet (0.4 mg total) under the tongue every 5 (five) minutes as needed for chest pain.   polyethylene glycol (MIRALAX) packet Take 17 g by mouth daily. (Patient taking differently: Take 17 g by mouth as needed.)   psyllium (METAMUCIL) 58.6 % packet Take 1 packet by mouth daily.   sucralfate (CARAFATE) 1 g tablet Take 1 tablet (1 g total) by mouth 2 (two) times daily.   traZODone (DESYREL) 100 MG tablet Take 1 tablet (100 mg total) by mouth at  bedtime as needed and may repeat dose one time if needed for sleep.   venlafaxine (EFFEXOR) 100 MG tablet Take 1 tablet (100 mg total) by mouth 3 (three) times daily with meals.   vitamin C (ASCORBIC ACID) 500 MG tablet Take 500 mg by mouth daily.   zinc gluconate 50 MG tablet Take 50 mg by mouth daily.   [DISCONTINUED] pneumococcal 20-Val Conj Vacc (PREVNAR 20) 0.5 ML injection Inject 0.5 mLs into the muscle tomorrow at 10 am.   [DISCONTINUED] Tdap (BOOSTRIX) 5-2.5-18.5 LF-MCG/0.5 injection Inject 0.5 mLs into the muscle once.   [DISCONTINUED] terbinafine (LAMISIL) 250 MG tablet Take 1 tablet (250 mg total) by mouth daily. For 12 weeks for toenail fungal infection   [DISCONTINUED] tiZANidine (ZANAFLEX) 4 MG tablet  Take 1 tablet (4 mg total) by mouth 3 (three) times daily.   [DISCONTINUED] Zoster Vaccine Adjuvanted [facility]) injection Inject 0.5 mLs into the muscle once.   [EXPIRED] pneumococcal 20-Val Conj Vacc (PREVNAR 20) 0.5 ML injection Inject 0.5 mLs into the muscle tomorrow at 10 am for 1 dose.   [EXPIRED] Tdap (BOOSTRIX) 5-2.5-18.5 LF-MCG/0.5 injection Inject 0.5 mLs into the muscle once for 1 dose.   tiZANidine (ZANAFLEX) 4 MG tablet Take 1 tablet (4 mg total) by mouth 3 (three) times daily.   [EXPIRED] Zoster Vaccine Adjuvanted [employer]) injection Inject 0.5 mLs into the muscle once for 1 dose.   [DISCONTINUED] aspirin EC 81 MG tablet Take 81 mg by mouth daily. Swallow whole. (Patient not taking: Reported on 03/23/2021)   [DISCONTINUED] ciprofloxacin (CIPRO) 500 MG tablet Take 1 tablet (500 mg total) by mouth 2 (two) times daily. (Patient not taking: Reported on 03/23/2021)   No facility-administered encounter medications on file as of 03/23/2021.    Surgical History: Past Surgical History:  Procedure Laterality Date   CHOLECYSTECTOMY  2004   COLONOSCOPY N/A 07/16/2017   Procedure: COLONOSCOPY;  Surgeon: Manya Silvas, MD;  Location: [facility] ENDOSCOPY;  Service: Endoscopy;  Laterality: N/A;   EXPLORATORY LAPAROTOMY  2009   GASTRIC BYPASS  2003   [place] N/A 03/01/2018   Procedure: EXPLORATORY LAPAROTOMY/ UMBILECTOMY;  Surgeon: Robert Bellow, MD;  Location: [facility] ORS;  Service: General;  Laterality: N/A;   TONSILLECTOMY     VENTRAL HERNIA REPAIR N/A 03/07/2018   Procedure: HERNIA REPAIR VENTRAL ADULT;  Surgeon: Robert Bellow, MD;  Location: [facility] ORS;  Service: General;  Laterality: N/A;    Medical History: Past Medical History:  Diagnosis Date   Anemia    Anxiety    Bipolar 1 disorder ([place])    Collagen vascular disease ([place])    rhematoid arthritis   Constipation    Fibromyalgia    GERD (gastroesophageal reflux disease)    Heart murmur     mild-asymptomatic   Hepatic steatosis    History of Roux-en-Y gastric bypass    Hypotension    Hypothyroidism    Opioid abuse (HCC)    Osteoarthritis    Osteoporosis    PONV (postoperative nausea and vomiting)    nausea only   Rheumatic fever    Rheumatoid arthritis ([place])    Thyroid disease     Family History: Family History  Problem Relation Age of Onset   Depression Mother    Dementia Mother    Heart disease Mother    Osteoporosis Mother    Parkinson's disease Father    Colon cancer Neg Hx     Social History   Socioeconomic History  Marital status: Married    Spouse name: Not on file   Number of children: Not on file   Years of education: Not on file   Highest education level: Not on file  Occupational History   Not on file  Tobacco Use   Smoking status: Never   Smokeless tobacco: Never  Vaping Use   Vaping Use: Never used  Substance and Sexual Activity   Alcohol use: Yes    Comment: occasional   Drug use: No   Sexual activity: Yes  Other Topics Concern   Not on file  Social History Narrative   Not on file   Social Determinants of Health   Financial Resource Strain: Not on file  Food Insecurity: Not on file  Transportation Needs: Not on file  Physical Activity: Not on file  Stress: Not on file  Social Connections: Not on file  Intimate Partner Violence: Not on file      Review of Systems  Constitutional:  Negative for chills, fatigue and unexpected weight change.  HENT:  Negative for congestion, rhinorrhea, sneezing and sore throat.   Eyes:  Negative for redness.  Respiratory:  Negative for cough, chest tightness and shortness of breath.   Cardiovascular:  Negative for chest pain and palpitations.  Gastrointestinal:  Negative for abdominal pain, constipation, diarrhea, nausea and vomiting.  Genitourinary:  Negative for dysuria and frequency.  Musculoskeletal:  Negative for arthralgias, back pain, joint swelling and neck pain.  Skin:   Negative for rash.  Neurological: Negative.  Negative for tremors and numbness.  Hematological:  Negative for adenopathy. Does not bruise/bleed easily.  Psychiatric/Behavioral:  Negative for behavioral problems (Depression), sleep disturbance and suicidal ideas. The patient is not nervous/anxious.    Vital Signs: BP 127/66   Pulse 73   Temp (!) 97.5 F (36.4 C)   Resp 16   Ht '5\' 5"'$  (1.651 m)   Wt (!) 346 lb 6.4 oz (157.1 kg)   SpO2 98%   BMI 57.64 kg/m    Physical Exam Vitals reviewed.  Constitutional:      General: She is not in acute distress.    Appearance: Normal appearance. She is obese. She is not ill-appearing.  HENT:     Head: Normocephalic and atraumatic.  Eyes:     Extraocular Movements: Extraocular movements intact.     Pupils: Pupils are equal, round, and reactive to light.  Cardiovascular:     Rate and Rhythm: Normal rate and regular rhythm.  Pulmonary:     Effort: Pulmonary effort is normal. No respiratory distress.  Neurological:     Mental Status: She is alert and oriented to person, place, and time.     Cranial Nerves: No cranial nerve deficit.     Coordination: Coordination normal.     Gait: Gait normal.  Psychiatric:        Mood and Affect: Mood normal.        Behavior: Behavior normal.     Assessment/Plan: 1. Atypical chest pain Echo ordered. Nitroglycerin tablets prescribed for angina-like chest pain. Instructed patient on how to use nitroglycerin tablets and that she should go to urgent care or ER if the chest pain is not resolved after 3 doses of nitroglycerin.  - ECHOCARDIOGRAM COMPLETE; Future - nitroGLYCERIN (NITROSTAT) 0.4 MG SL tablet; Place 1 tablet (0.4 mg total) under the tongue every 5 (five) minutes as needed for chest pain.  Dispense: 30 tablet; Refill: 12  2. Tinea corporis Tried OTC topicals with no improvement, ketoconazole  prescribed.  - ketoconazole (NIZORAL) 2 % cream; Apply 1 application topically daily. To affected area For  6 weeks or until rash resolves.  Dispense: 30 g; Refill: 2  3. Neck pain, musculoskeletal Tizanidine and gabapentin prescribed to help alleviate neck pain especially at night.  - tiZANidine (ZANAFLEX) 4 MG tablet; Take 1 tablet (4 mg total) by mouth 3 (three) times daily.  Dispense: 90 tablet; Refill: 0 - gabapentin (NEURONTIN) 100 MG capsule; Take 1 capsule (100 mg total) by mouth 3 (three) times daily.  Dispense: 90 capsule; Refill: 0  4. Other inflammatory polyneuropathies (HCC) Tizanidine prescribed to help with muscle spasms due to musculoskeletal pain caused by arthritis, gabapentin prescribed to help with neuropathy. - tiZANidine (ZANAFLEX) 4 MG tablet; Take 1 tablet (4 mg total) by mouth 3 (three) times daily.  Dispense: 90 tablet; Refill: 0 - gabapentin (NEURONTIN) 100 MG capsule; Take 1 capsule (100 mg total) by mouth 3 (three) times daily.  Dispense: 90 capsule; Refill: 0  5. Need for vaccination - Zoster Vaccine Adjuvanted [facility]) injection; Inject 0.5 mLs into the muscle once for 1 dose.  Dispense: 0.5 mL; Refill: 0 - Tdap (BOOSTRIX) 5-2.5-18.5 LF-MCG/0.5 injection; Inject 0.5 mLs into the muscle once for 1 dose.  Dispense: 0.5 mL; Refill: 0 - pneumococcal 20-Val Conj Vacc (PREVNAR 20) 0.5 ML injection; Inject 0.5 mLs into the muscle tomorrow at 10 am for 1 dose.  Dispense: 0.5 mL; Refill: 0   General Counseling: Shunte verbalizes understanding of the findings of todays visit and agrees with plan of treatment. I have discussed any further diagnostic evaluation that may be needed or ordered today. We also reviewed her medications today. she has been encouraged to call the office with any questions or concerns that should arise related to todays visit.    Orders Placed This Encounter  Procedures   ECHOCARDIOGRAM COMPLETE    Meds ordered this encounter  Medications   Zoster Vaccine Adjuvanted [facility]) injection    Sig: Inject 0.5 mLs into the muscle once for 1 dose.     Dispense:  0.5 mL    Refill:  0   Tdap (BOOSTRIX) 5-2.5-18.5 LF-MCG/0.5 injection    Sig: Inject 0.5 mLs into the muscle once for 1 dose.    Dispense:  0.5 mL    Refill:  0   pneumococcal 20-Val Conj Vacc (PREVNAR 20) 0.5 ML injection    Sig: Inject 0.5 mLs into the muscle tomorrow at 10 am for 1 dose.    Dispense:  0.5 mL    Refill:  0   tiZANidine (ZANAFLEX) 4 MG tablet    Sig: Take 1 tablet (4 mg total) by mouth 3 (three) times daily.    Dispense:  90 tablet    Refill:  0   nitroGLYCERIN (NITROSTAT) 0.4 MG SL tablet    Sig: Place 1 tablet (0.4 mg total) under the tongue every 5 (five) minutes as needed for chest pain.    Dispense:  30 tablet    Refill:  12   gabapentin (NEURONTIN) 100 MG capsule    Sig: Take 1 capsule (100 mg total) by mouth 3 (three) times daily.    Dispense:  90 capsule    Refill:  0   DISCONTD: terbinafine (LAMISIL) 250 MG tablet    Sig: Take 1 tablet (250 mg total) by mouth daily. For 12 weeks for toenail fungal infection    Dispense:  84 tablet    Refill:  0   ketoconazole (NIZORAL) 2 %  cream    Sig: Apply 1 application topically daily. To affected area For 6 weeks or until rash resolves.    Dispense:  30 g    Refill:  2    Return in about 4 weeks (around 04/20/2021) for F/U for echo results with me or lauren .   Total time spent:30 Minutes Time spent includes review of chart, medications, test results, and follow up plan with the patient.   [place] Controlled Substance Database was reviewed by me.  This patient was seen by Jonetta Osgood, FNP-C in collaboration with Dr. Clayborn Bigness as a part of collaborative care agreement.   Xcaret Morad R. Valetta Fuller, MSN, FNP-C Internal medicine

## 2021-03-23 NOTE — Telephone Encounter (Signed)
90 tab ok to fill

## 2021-03-23 NOTE — Telephone Encounter (Signed)
[facility] phar called that pt not qualified for Prevnar 20 advised her hold on this due to pt had pneumovax 23 and we will discuss with pt on next visit

## 2021-03-24 DIAGNOSIS — Z79899 Other long term (current) drug therapy: Secondary | ICD-10-CM | POA: Diagnosis not present

## 2021-04-04 ENCOUNTER — Ambulatory Visit: Payer: Medicare HMO

## 2021-04-04 ENCOUNTER — Ambulatory Visit (INDEPENDENT_AMBULATORY_CARE_PROVIDER_SITE_OTHER): Payer: Medicare HMO

## 2021-04-04 ENCOUNTER — Other Ambulatory Visit: Payer: Self-pay

## 2021-04-04 ENCOUNTER — Ambulatory Visit
Admission: EM | Admit: 2021-04-04 | Discharge: 2021-04-04 | Disposition: A | Discharge status: Home / Self Care | Payer: Medicare HMO | Attending: Emergency Medicine | Admitting: Emergency Medicine

## 2021-04-04 ENCOUNTER — Encounter: Payer: Self-pay | Admitting: Emergency Medicine

## 2021-04-04 DIAGNOSIS — R030 Elevated blood-pressure reading, without diagnosis of hypertension: Secondary | ICD-10-CM | POA: Diagnosis not present

## 2021-04-04 DIAGNOSIS — W19XXXA Unspecified fall, initial encounter: Secondary | ICD-10-CM

## 2021-04-04 DIAGNOSIS — M25562 Pain in left knee: Secondary | ICD-10-CM

## 2021-04-04 NOTE — Discharge Instructions (Addendum)
Rest and elevate your knee.  Apply ice packs as directed.  Follow-up with an orthopedist such as the one listed below if your symptoms are not improving.  Your blood pressure is elevated today at 157/85.  Please have this rechecked by your primary care provider in 2-4 weeks.

## 2021-04-04 NOTE — ED Provider Notes (Signed)
[name]    CSN: [id number] Arrival date & time: 04/04/21  1518      History   Chief Complaint Chief Complaint  Patient presents with   Knee Pain    HPI Jamie Bennett is a 60 y.o. female.  Patient presents with left knee pain x1 week.  The pain started after she fell from the bed to the floor when her foot tangled in some clothes on the floor.  The pain is worse with weightbearing and walking; improves with rest.  Treatment attempted at home with heat and ice.  She denies numbness, weakness, paresthesias, redness, wounds, bruising, fever, or other symptoms.  Her medical history includes rheumatoid arthritis, bipolar disorder, opioid dependence, drug overdose, fibromyalgia, bariatric surgery.  The history is provided by the patient and medical records.   Past Medical History:  Diagnosis Date   Anemia    Anxiety    Bipolar 1 disorder ([place])    Collagen vascular disease ([place])    rhematoid arthritis   Constipation    Fibromyalgia    GERD (gastroesophageal reflux disease)    Heart murmur    mild-asymptomatic   Hepatic steatosis    History of Roux-en-Y gastric bypass    Hypotension    Hypothyroidism    Opioid abuse (HCC)    Osteoarthritis    Osteoporosis    PONV (postoperative nausea and vomiting)    nausea only   Rheumatic fever    Rheumatoid arthritis ([place])    Thyroid disease     Patient Active Problem List   Diagnosis Date Noted   Neck pain, musculoskeletal 03/24/2020   Bipolar affective disorder, current episode mixed ([place]) 11/09/2019   Encounter for general adult medical examination with abnormal findings 06/15/2019   Closed fracture of left elbow 06/15/2019   Urinary tract infection without hematuria 06/15/2019   Encounter for long-term (current) use of medications 06/15/2019   Acute upper respiratory infection 06/06/2019   Exposure to COVID-19 virus 06/06/2019   Exercise-induced asthma 12/15/2018   Screening for breast cancer 05/29/2018    Duodenal ulcer disease 05/29/2018   Screening for malignant neoplasm of cervix 05/29/2018   Dysuria 05/29/2018   Surgical wound dehiscence, initial encounter 04/14/2018   Postoperative abdominal hernia with obstruction    Incarcerated ventral hernia 03/22/2018   SBO (small bowel obstruction) ([place]) 03/07/2018   Abscess of abdominal wall 03/01/2018   Persistent umbilical sinus [id number]   Ringworm of body 01/23/2018   Atopic dermatitis 12/05/2017   Adjustment disorder with mixed anxiety and depressed mood 03/10/2017   Major depressive disorder 03/10/2017   Primary osteoarthritis of both knees 01/19/2017   Suicide attempt ([place]) 07/10/2016   Fibromyalgia 07/10/2016   High risk medication use 07/10/2016   AKI (acute kidney injury) ([place]) 11/09/2015   Elevated troponin 11/09/2015   Hypotension 11/09/2015   Respiratory failure ([place])    Acute hepatic failure 11/08/2015   Drug overdose 11/08/2015   Fatty infiltration of liver 02/16/2015   Hepatic fibrosis 02/16/2015   Abnormal serum level of alkaline phosphatase 02/15/2015   Iron deficiency anemia 02/05/2015   Vitamin B 12 deficiency 02/05/2015   OP (osteoporosis) 06/16/2014   Rheumatoid arteritis ([place]) 03/02/2014   Hypothyroidism 03/02/2014   Rheumatic fever without heart involvement 03/02/2014   Adult hypothyroidism 03/02/2014   Arthritis of pelvic region, degenerative 03/02/2014   Bipolar 1 disorder, depressed ([place]) 02/26/2014   Bariatric surgery status 11/24/2013   Affective bipolar disorder ([place]) 11/24/2013   Bipolar affective disorder ([place]) 11/24/2013  Rheumatoid arthritis ([place]) 11/24/2013   Polysubstance (excluding opioids) dependence ([place]) 09/11/2013   Polysubstance dependence ([place]) 09/11/2013   Combined drug dependence excluding opioids ([place]) 09/11/2013   Arthritis or polyarthritis, rheumatoid ([place]) 09/05/2013   Leg weakness 09/05/2013    Past Surgical History:  Procedure Laterality Date   CHOLECYSTECTOMY  2004    COLONOSCOPY N/A 07/16/2017   Procedure: COLONOSCOPY;  Surgeon: Manya Silvas, MD;  Location: [facility] ENDOSCOPY;  Service: Endoscopy;  Laterality: N/A;   EXPLORATORY LAPAROTOMY  2009   GASTRIC BYPASS  2003   [place] N/A 03/01/2018   Procedure: EXPLORATORY LAPAROTOMY/ UMBILECTOMY;  Surgeon: Robert Bellow, MD;  Location: [facility] ORS;  Service: General;  Laterality: N/A;   TONSILLECTOMY     VENTRAL HERNIA REPAIR N/A 03/07/2018   Procedure: HERNIA REPAIR VENTRAL ADULT;  Surgeon: Robert Bellow, MD;  Location: [facility] ORS;  Service: General;  Laterality: N/A;    OB History     Gravida  2   Para  1   Term      Preterm      AB      Living         SAB      IAB      Ectopic      Multiple      Live Births           Obstetric Comments  1st Menstrual Cycle:  11 1st Pregnancy:  21           Home Medications    Prior to Admission medications   Medication Sig Start Date End Date Taking? Authorizing Provider  busPIRone (BUSPAR) 30 MG tablet 1 bid 02/22/21  Yes Plovsky, Berneta Sages, MD  Coenzyme Q10 300 MG CAPS Take by mouth.   Yes [provider]  desipramine (NORPRAMIN) 25 MG tablet Take 1 tablet (25 mg total) by mouth daily. 02/22/21 02/22/22 Yes Plovsky, Berneta Sages, MD  esomeprazole (NEXIUM) 20 MG capsule Take 20 mg by mouth daily.    Yes [provider]  gabapentin (NEURONTIN) 100 MG capsule Take 1 capsule (100 mg total) by mouth 3 (three) times daily. 03/23/21  Yes Abernathy, Yetta Flock, NP  hydroxychloroquine (PLAQUENIL) 200 MG tablet TAKE ONE TABLET BY MOUTH TWICE A DAY 02/09/21  Yes Ofilia Neas, PA-C  lamoTRIgine (LAMICTAL) 150 MG tablet Take 1 tablet (150 mg total) by mouth 2 (two) times daily. 02/22/21  Yes Plovsky, Berneta Sages, MD  levothyroxine (SYNTHROID) 112 MCG tablet Take 1 tablet (112 mcg total) by mouth daily. 10/18/20  Yes Lavera Guise, MD  Multiple Vitamin (MULTIVITAMIN) tablet Take 1 tablet by mouth daily.   Yes [provider]  terbinafine (LAMISIL) 250 MG tablet TAKE 1 TABLET BY MOUTH ONCE DAILY FOR  12  WEEKS  FOR  TOENAIL  FUNGAL  INFECTION 03/23/21  Yes Abernathy, Alyssa, NP  traZODone (DESYREL) 100 MG tablet Take 1 tablet (100 mg total) by mouth at bedtime as needed and may repeat dose one time if needed for sleep. 02/22/21  Yes Plovsky, Berneta Sages, MD  venlafaxine (EFFEXOR) 100 MG tablet Take 1 tablet (100 mg total) by mouth 3 (three) times daily with meals. 02/22/21 02/22/22 Yes Plovsky, Berneta Sages, MD  Cholecalciferol 125 MCG (5000 UT) TABS Take 1 tablet by mouth daily.    [provider]  clonazePAM (KLONOPIN) 0.5 MG tablet TAKE 1 TABLET BY MOUTH ONCE DAILY AS NEEDED FOR  ACUTE  ANXIETY  EMERGENCIES  ONLY  DO  NOT  EXCEED  ONE  PER  DAY 02/22/21   Plovsky, Berneta Sages, MD  cyanocobalamin (,VITAMIN B-12,) 1000 MCG/ML injection Inject 1,000 mcg into the muscle every 30 (thirty) days.     [provider]  denosumab (PROLIA) 60 MG/ML SOSY injection Inject 60 mg into the skin every 6 (six) months.    [provider]  hydrOXYzine (ATARAX/VISTARIL) 50 MG tablet 1  Tid   1  Prn q day 02/22/21   Plovsky, Berneta Sages, MD  ketoconazole (NIZORAL) 2 % cream Apply 1 application topically daily. To affected area For 6 weeks or until rash resolves. 03/23/21   Jonetta Osgood, NP  Krill Oil 500 MG CAPS Take 500 mg by mouth daily.    [provider]  loratadine (CLARITIN) 10 MG tablet Take 10 mg by mouth daily as needed for allergies.    [provider]  magnesium oxide (MAG-OX) 400 MG tablet Take 400 mg by mouth daily.    [provider]  nitroGLYCERIN (NITROSTAT) 0.4 MG SL tablet Place 1 tablet (0.4 mg total) under the tongue every 5 (five) minutes as needed for chest pain. 03/23/21   Jonetta Osgood, NP  polyethylene glycol (MIRALAX) packet Take 17 g by mouth daily. Patient taking differently: Take 17 g by mouth as needed. 02/10/18   Loney Hering, MD  psyllium (METAMUCIL) 58.6 % packet  Take 1 packet by mouth daily.    [provider]  sucralfate (CARAFATE) 1 g tablet Take 1 tablet (1 g total) by mouth 2 (two) times daily. 08/30/20 08/30/21  Lavera Guise, MD  tiZANidine (ZANAFLEX) 4 MG tablet Take 1 tablet (4 mg total) by mouth 3 (three) times daily. 03/23/21   Jonetta Osgood, NP  vitamin C (ASCORBIC ACID) 500 MG tablet Take 500 mg by mouth daily.    [provider]  zinc gluconate 50 MG tablet Take 50 mg by mouth daily.    [provider]    Family History Family History  Problem Relation Age of Onset   Depression Mother    Dementia Mother    Heart disease Mother    Osteoporosis Mother    Parkinson's disease Father    Colon cancer Neg Hx     Social History Social History   Tobacco Use   Smoking status: Never   Smokeless tobacco: Never  Vaping Use   Vaping Use: Never used  Substance Use Topics   Alcohol use: Yes    Comment: occasional   Drug use: No     Allergies   Lactose intolerance (gi) and Sulfa antibiotics   Review of Systems Review of Systems  Constitutional:  Negative for chills and fever.  Respiratory:  Negative for cough and shortness of breath.   Cardiovascular:  Negative for chest pain and palpitations.  Musculoskeletal:  Positive for arthralgias. Negative for back pain.  Skin:  Negative for color change, rash and wound.  Neurological:  Negative for weakness and numbness.  All other systems reviewed and are negative.   Physical Exam Triage Vital Signs ED Triage Vitals  Enc Vitals Group     BP      Pulse      Resp      Temp      Temp src      SpO2      Weight      Height      Head Circumference      Peak Flow      Pain Score  Pain Loc      Pain Edu?      Excl. in [place]?    No data found.  Updated Vital Signs BP (!) 157/85 (BP Location: Left Arm)   Pulse 76   Temp 98.2 F (36.8 C) (Oral)   Resp 18   SpO2 96%   Visual Acuity Right Eye Distance:   Left Eye Distance:   Bilateral  Distance:    Right Eye Near:   Left Eye Near:    Bilateral Near:     Physical Exam Vitals and nursing note reviewed.  Constitutional:      General: She is not in acute distress.    Appearance: She is well-developed. She is obese. She is not ill-appearing.  HENT:     Head: Normocephalic and atraumatic.     Mouth/Throat:     Mouth: Mucous membranes are moist.  Eyes:     Conjunctiva/sclera: Conjunctivae normal.  Cardiovascular:     Rate and Rhythm: Normal rate and regular rhythm.     Heart sounds: Normal heart sounds.  Pulmonary:     Effort: Pulmonary effort is normal. No respiratory distress.     Breath sounds: Normal breath sounds.  Abdominal:     Palpations: Abdomen is soft.     Tenderness: There is no abdominal tenderness.  Musculoskeletal:        General: Tenderness present. No swelling, deformity or signs of injury. Normal range of motion.     Cervical back: Neck supple.       Legs:  Skin:    General: Skin is warm and dry.     Capillary Refill: Capillary refill takes less than 2 seconds.     Findings: No bruising, erythema, lesion or rash.  Neurological:     General: No focal deficit present.     Mental Status: She is alert and oriented to person, place, and time.     Sensory: No sensory deficit.     Motor: No weakness.     Gait: Gait normal.  Psychiatric:        Mood and Affect: Mood normal.        Behavior: Behavior normal.     UC Treatments / Results  Labs (all labs ordered are listed, but only abnormal results are displayed) Labs Reviewed - No data to display  EKG   Radiology DG Knee Complete 4 Views Left  Result Date: 04/04/2021 CLINICAL DATA:  Medial knee pain after fall EXAM: LEFT KNEE - COMPLETE 4+ VIEW COMPARISON:  07/01/2020 FINDINGS: No fracture or malalignment. Small knee effusion. Mild tricompartment arthritis. IMPRESSION: 1. No acute osseous abnormality 2. Tricompartment arthritis with small effusion Electronically Signed   By: Donavan Foil  M.D.   On: 04/04/2021 15:51    Procedures Procedures (including critical care time)  Medications Ordered in UC Medications - No data to display  Initial Impression / Assessment and Plan / UC Course  I have reviewed the triage vital signs and the nursing notes.  Pertinent labs & imaging results that were available during my care of the patient were reviewed by me and considered in my medical decision making (see chart for details).  Acute left knee pain due to fall.  Elevated blood pressure reading.  X-ray shows no acute bone abnormality.  Instructed patient to follow-up with an orthopedist if her knee pain persists.  Discussed rest, elevation, ice packs.  Also discussed that her blood pressure is elevated today and needs to be rechecked by her PCP in  2 to 4 weeks.  She agrees to plan of care.   Final Clinical Impressions(s) / UC Diagnoses   Final diagnoses:  Fall  Acute pain of left knee  Elevated blood pressure reading     Discharge Instructions      Rest and elevate your knee.  Apply ice packs as directed.  Follow-up with an orthopedist such as the one listed below if your symptoms are not improving.  Your blood pressure is elevated today at 157/85.  Please have this rechecked by your primary care provider in 2-4 weeks.          ED Prescriptions   None    PDMP not reviewed this encounter.   Sharion Balloon, NP 04/04/21 [phone]

## 2021-04-04 NOTE — ED Triage Notes (Signed)
Patient c/o LFT knee pain x 1 week.   Patient endorses she had a fall.   Patient endorses pain is improved upon waking, patient endorses increased pain " as the day goes on".   Patient endorses increased pain " when sitting in one position for an extended length of time".   Patient has used ice and heat with no relief of symptoms.   History of Arthritis.

## 2021-04-06 ENCOUNTER — Ambulatory Visit: Payer: Medicare HMO

## 2021-04-06 ENCOUNTER — Other Ambulatory Visit: Payer: Self-pay

## 2021-04-06 DIAGNOSIS — R0789 Other chest pain: Secondary | ICD-10-CM | POA: Diagnosis not present

## 2021-04-10 ENCOUNTER — Other Ambulatory Visit: Payer: Self-pay | Admitting: Internal Medicine

## 2021-04-12 ENCOUNTER — Inpatient Hospital Stay: Payer: Medicare HMO | Attending: Oncology

## 2021-04-12 ENCOUNTER — Other Ambulatory Visit: Payer: Self-pay

## 2021-04-12 ENCOUNTER — Inpatient Hospital Stay: Payer: Medicare HMO

## 2021-04-12 DIAGNOSIS — Z79899 Other long term (current) drug therapy: Secondary | ICD-10-CM | POA: Diagnosis not present

## 2021-04-12 DIAGNOSIS — D509 Iron deficiency anemia, unspecified: Secondary | ICD-10-CM | POA: Diagnosis not present

## 2021-04-12 DIAGNOSIS — E538 Deficiency of other specified B group vitamins: Secondary | ICD-10-CM | POA: Insufficient documentation

## 2021-04-12 LAB — CBC WITH DIFFERENTIAL/PLATELET
Abs Immature Granulocytes: 0.03 10*3/uL (ref 0.00–0.07)
Basophils Absolute: 0 10*3/uL (ref 0.0–0.1)
Basophils Relative: 1 %
Eosinophils Absolute: 0.3 10*3/uL (ref 0.0–0.5)
Eosinophils Relative: 5 %
HCT: 39.3 % (ref 36.0–46.0)
Hemoglobin: 12.7 g/dL (ref 12.0–15.0)
Immature Granulocytes: 1 %
Lymphocytes Relative: 35 %
Lymphs Abs: 1.7 10*3/uL (ref 0.7–4.0)
MCH: 29.3 pg (ref 26.0–34.0)
MCHC: 32.3 g/dL (ref 30.0–36.0)
MCV: 90.6 fL (ref 80.0–100.0)
Monocytes Absolute: 0.5 10*3/uL (ref 0.1–1.0)
Monocytes Relative: 9 %
Neutro Abs: 2.5 10*3/uL (ref 1.7–7.7)
Neutrophils Relative %: 49 %
Platelets: 234 10*3/uL (ref 150–400)
RBC: 4.34 MIL/uL (ref 3.87–5.11)
RDW: 13.3 % (ref 11.5–15.5)
WBC: 5 10*3/uL (ref 4.0–10.5)
nRBC: 0 % (ref 0.0–0.2)

## 2021-04-12 LAB — COMPREHENSIVE METABOLIC PANEL
ALT: 16 U/L (ref 0–44)
AST: 22 U/L (ref 15–41)
Albumin: 4.3 g/dL (ref 3.5–5.0)
Alkaline Phosphatase: 94 U/L (ref 38–126)
Anion gap: 6 (ref 5–15)
BUN: 19 mg/dL (ref 6–20)
CO2: 25 mmol/L (ref 22–32)
Calcium: 9.1 mg/dL (ref 8.9–10.3)
Chloride: 108 mmol/L (ref 98–111)
Creatinine, Ser: 0.88 mg/dL (ref 0.44–1.00)
GFR, Estimated: >60 mL/min (ref >60)
Glucose, Bld: 93 mg/dL (ref 70–99)
Potassium: 3.8 mmol/L (ref 3.5–5.1)
Sodium: 139 mmol/L (ref 135–145)
Total Bilirubin: 0.6 mg/dL (ref 0.3–1.2)
Total Protein: 7.5 g/dL (ref 6.5–8.1)

## 2021-04-12 LAB — IRON AND TIBC
Iron: 81 ug/dL (ref 28–170)
Saturation Ratios: 17 % (ref 10.4–31.8)
TIBC: 489 ug/dL — ABNORMAL HIGH (ref 250–450)
UIBC: 408 ug/dL

## 2021-04-12 LAB — FERRITIN: Ferritin: 41 ng/mL (ref 11–307)

## 2021-04-12 LAB — VITAMIN B12: Vitamin B-12: 1950 pg/mL — ABNORMAL HIGH (ref 180–914)

## 2021-04-12 MED ORDER — CYANOCOBALAMIN 1000 MCG/ML IJ SOLN
1000.0000 ug | Freq: Once | INTRAMUSCULAR | Status: AC
  Administered 2021-04-12: 1000 ug via INTRAMUSCULAR
  Filled 2021-04-12: qty 1

## 2021-04-14 ENCOUNTER — Encounter: Payer: Self-pay | Admitting: Nurse Practitioner

## 2021-04-14 ENCOUNTER — Other Ambulatory Visit: Payer: Self-pay

## 2021-04-14 ENCOUNTER — Ambulatory Visit (INDEPENDENT_AMBULATORY_CARE_PROVIDER_SITE_OTHER): Payer: Medicare HMO | Admitting: Nurse Practitioner

## 2021-04-14 VITALS — BP 140/76 | HR 70 | Temp 98.4°F | Resp 16 | Ht 65.0 in | Wt 353.2 lb

## 2021-04-14 DIAGNOSIS — Z6841 Body Mass Index (BMI) 40.0 and over, adult: Secondary | ICD-10-CM | POA: Diagnosis not present

## 2021-04-14 DIAGNOSIS — H9313 Tinnitus, bilateral: Secondary | ICD-10-CM | POA: Diagnosis not present

## 2021-04-14 DIAGNOSIS — H903 Sensorineural hearing loss, bilateral: Secondary | ICD-10-CM | POA: Diagnosis not present

## 2021-04-14 DIAGNOSIS — M25562 Pain in left knee: Secondary | ICD-10-CM | POA: Diagnosis not present

## 2021-04-14 DIAGNOSIS — G6189 Other inflammatory polyneuropathies: Secondary | ICD-10-CM | POA: Diagnosis not present

## 2021-04-14 DIAGNOSIS — M1712 Unilateral primary osteoarthritis, left knee: Secondary | ICD-10-CM | POA: Diagnosis not present

## 2021-04-14 DIAGNOSIS — I700 Atherosclerosis of aorta: Secondary | ICD-10-CM | POA: Diagnosis not present

## 2021-04-14 DIAGNOSIS — B354 Tinea corporis: Secondary | ICD-10-CM

## 2021-04-14 NOTE — Progress Notes (Signed)
[facility], [place] [address]  Internal MEDICINE  Office Visit Note  Patient Name: Jamie Bennett  [id number]  [id number]  Date of Service: 04/14/2021  Chief Complaint  Patient presents with   Follow-up    Review u/s   Gastroesophageal Reflux   Anemia   Anxiety    HPI Zaidee presents for a follow up visit to discuss echocardiogram results. The results showed diastolic dysfunction, mild LVH, aortic regurgitation and aortic sclerosis, high normal size of ascending aorta and possible pulmonary arterial hypertension. LVEF was 59.44%.  Requesting increase in gabapentin dose to help with restless legs and nerve pain. Rash on arm is improving She is taking oral terbinafine for fungal toe nail.     Current Medication: Outpatient Encounter Medications as of 04/14/2021  Medication Sig   gabapentin (NEURONTIN) 100 MG capsule Take 2 capsules (200 mg total) by mouth 3 (three) times daily.   busPIRone (BUSPAR) 30 MG tablet 1 bid   Cholecalciferol 125 MCG (5000 UT) TABS Take 1 tablet by mouth daily.   clonazePAM (KLONOPIN) 0.5 MG tablet TAKE 1 TABLET BY MOUTH ONCE DAILY AS NEEDED FOR  ACUTE  ANXIETY  EMERGENCIES  ONLY  DO  NOT  EXCEED  ONE  PER  DAY   Coenzyme Q10 300 MG CAPS Take by mouth.   cyanocobalamin (,VITAMIN B-12,) 1000 MCG/ML injection Inject 1,000 mcg into the muscle every 30 (thirty) days.    denosumab (PROLIA) 60 MG/ML SOSY injection Inject 60 mg into the skin every 6 (six) months.   desipramine (NORPRAMIN) 25 MG tablet Take 1 tablet (25 mg total) by mouth daily.   esomeprazole (NEXIUM) 20 MG capsule Take 20 mg by mouth daily.    hydroxychloroquine (PLAQUENIL) 200 MG tablet TAKE ONE TABLET BY MOUTH TWICE A DAY   hydrOXYzine (ATARAX/VISTARIL) 50 MG tablet 1  Tid   1  Prn q day   ketoconazole (NIZORAL) 2 % cream Apply 1 application topically daily. To affected area For 6 weeks or until rash resolves.   Krill Oil 500 MG CAPS Take 500 mg by  mouth daily.   lamoTRIgine (LAMICTAL) 150 MG tablet Take 1 tablet (150 mg total) by mouth 2 (two) times daily.   levothyroxine (SYNTHROID) 112 MCG tablet TAKE 1 TABLET DAILY   loratadine (CLARITIN) 10 MG tablet Take 10 mg by mouth daily as needed for allergies.   magnesium oxide (MAG-OX) 400 MG tablet Take 400 mg by mouth daily.   Multiple Vitamin (MULTIVITAMIN) tablet Take 1 tablet by mouth daily.   nitroGLYCERIN (NITROSTAT) 0.4 MG SL tablet Place 1 tablet (0.4 mg total) under the tongue every 5 (five) minutes as needed for chest pain.   polyethylene glycol (MIRALAX) packet Take 17 g by mouth daily. (Patient taking differently: Take 17 g by mouth as needed.)   psyllium (METAMUCIL) 58.6 % packet Take 1 packet by mouth daily.   sucralfate (CARAFATE) 1 g tablet Take 1 tablet (1 g total) by mouth 2 (two) times daily.   terbinafine (LAMISIL) 250 MG tablet TAKE 1 TABLET BY MOUTH ONCE DAILY FOR  12  WEEKS  FOR  TOENAIL  FUNGAL  INFECTION   traZODone (DESYREL) 100 MG tablet Take 1 tablet (100 mg total) by mouth at bedtime as needed and may repeat dose one time if needed for sleep.   venlafaxine (EFFEXOR) 100 MG tablet Take 1 tablet (100 mg total) by mouth 3 (three) times daily with meals.   vitamin C (ASCORBIC  ACID) 500 MG tablet Take 500 mg by mouth daily.   zinc gluconate 50 MG tablet Take 50 mg by mouth daily.   [DISCONTINUED] gabapentin (NEURONTIN) 100 MG capsule Take 1 capsule (100 mg total) by mouth 3 (three) times daily.   [DISCONTINUED] tiZANidine (ZANAFLEX) 4 MG tablet Take 1 tablet (4 mg total) by mouth 3 (three) times daily.   No facility-administered encounter medications on file as of 04/14/2021.    Surgical History: Past Surgical History:  Procedure Laterality Date   CHOLECYSTECTOMY  2004   COLONOSCOPY N/A 07/16/2017   Procedure: COLONOSCOPY;  Surgeon: Manya Silvas, MD;  Location: [facility] ENDOSCOPY;  Service: Endoscopy;  Laterality: N/A;   EXPLORATORY LAPAROTOMY  2009   GASTRIC  BYPASS  2003   [place] N/A 03/01/2018   Procedure: EXPLORATORY LAPAROTOMY/ UMBILECTOMY;  Surgeon: Robert Bellow, MD;  Location: [facility] ORS;  Service: General;  Laterality: N/A;   TONSILLECTOMY     VENTRAL HERNIA REPAIR N/A 03/07/2018   Procedure: HERNIA REPAIR VENTRAL ADULT;  Surgeon: Robert Bellow, MD;  Location: [facility] ORS;  Service: General;  Laterality: N/A;    Medical History: Past Medical History:  Diagnosis Date   Anemia    Anxiety    Bipolar 1 disorder ([place])    Collagen vascular disease ([place])    rhematoid arthritis   Constipation    Fibromyalgia    GERD (gastroesophageal reflux disease)    Heart murmur    mild-asymptomatic   Hepatic steatosis    History of Roux-en-Y gastric bypass    Hypotension    Hypothyroidism    Opioid abuse (HCC)    Osteoarthritis    Osteoporosis    PONV (postoperative nausea and vomiting)    nausea only   Rheumatic fever    Rheumatoid arthritis ([place])    Thyroid disease     Family History: Family History  Problem Relation Age of Onset   Depression Mother    Dementia Mother    Heart disease Mother    Osteoporosis Mother    Parkinson's disease Father    Colon cancer Neg Hx     Social History   Socioeconomic History   Marital status: Married    Spouse name: Not on file   Number of children: Not on file   Years of education: Not on file   Highest education level: Not on file  Occupational History   Not on file  Tobacco Use   Smoking status: Never   Smokeless tobacco: Never  Vaping Use   Vaping Use: Never used  Substance and Sexual Activity   Alcohol use: Yes    Comment: occasional   Drug use: No   Sexual activity: Yes  Other Topics Concern   Not on file  Social History Narrative   Not on file   Social Determinants of Health   Financial Resource Strain: Not on file  Food Insecurity: Not on file  Transportation Needs: Not on file  Physical Activity: Not on file  Stress: Not on file  Social  Connections: Not on file  Intimate Partner Violence: Not on file      Review of Systems  Constitutional:  Negative for chills, fatigue and unexpected weight change.  HENT:  Negative for congestion, rhinorrhea, sneezing and sore throat.   Eyes:  Negative for redness.  Respiratory:  Negative for cough, chest tightness and shortness of breath.   Cardiovascular:  Negative for chest pain and palpitations.  Gastrointestinal:  Negative for abdominal  pain, constipation, diarrhea, nausea and vomiting.  Genitourinary:  Negative for dysuria and frequency.  Musculoskeletal:  Positive for arthralgias. Negative for back pain, joint swelling and neck pain.  Skin:  Negative for rash.  Neurological: Negative.  Negative for tremors and numbness.       Neuropathic pain in legs  Hematological:  Negative for adenopathy. Does not bruise/bleed easily.  Psychiatric/Behavioral:  Negative for behavioral problems (Depression), sleep disturbance and suicidal ideas. The patient is not nervous/anxious.    Vital Signs: BP 140/76   Pulse 70   Temp 98.4 F (36.9 C)   Resp 16   Ht '5\' 5"'$  (1.651 m)   Wt (!) 353 lb 3.2 oz (160.2 kg)   SpO2 98%   BMI 58.78 kg/m    Physical Exam Vitals reviewed.  Constitutional:      General: She is not in acute distress.    Appearance: Normal appearance. She is obese. She is not ill-appearing.  HENT:     Head: Normocephalic and atraumatic.  Eyes:     Extraocular Movements: Extraocular movements intact.     Pupils: Pupils are equal, round, and reactive to light.  Cardiovascular:     Rate and Rhythm: Normal rate and regular rhythm.  Pulmonary:     Effort: Pulmonary effort is normal. No respiratory distress.  Neurological:     Mental Status: She is alert and oriented to person, place, and time.     Cranial Nerves: No cranial nerve deficit.     Coordination: Coordination normal.     Gait: Gait normal.  Psychiatric:        Mood and Affect: Mood normal.        Behavior:  Behavior normal.       Assessment/Plan: 1. Aortic atherosclerosis (HCC) Evaluate for carotid stenosis - US Carotid Duplex Bilateral; Future  2. Other inflammatory polyneuropathies (HCC) Gabapentin dose increased - gabapentin (NEURONTIN) 100 MG capsule; Take 2 capsules (200 mg total) by mouth 3 (three) times daily.  Dispense: 180 capsule; Refill: 0  3. Tinea corporis Improving, continue topical treatment   General Counseling: Alyisa verbalizes understanding of the findings of todays visit and agrees with plan of treatment. I have discussed any further diagnostic evaluation that may be needed or ordered today. We also reviewed her medications today. she has been encouraged to call the office with any questions or concerns that should arise related to todays visit.    Orders Placed This Encounter  Procedures   US Carotid Duplex Bilateral    Meds ordered this encounter  Medications   gabapentin (NEURONTIN) 100 MG capsule    Sig: Take 2 capsules (200 mg total) by mouth 3 (three) times daily.    Dispense:  180 capsule    Refill:  0    Return in about 2 months (around 06/14/2021) for F/U, U/S @ [facility], med refill, [place] PCP.   Total time spent:20 Minutes Time spent includes review of chart, medications, test results, and follow up plan with the patient.    Controlled Substance Database was reviewed by me.  This patient was seen by Jonetta Osgood, FNP-C in collaboration with Dr. Clayborn Bigness as a part of collaborative care agreement.   Creola Krotz R. Valetta Fuller, MSN, FNP-C Internal medicine

## 2021-04-15 ENCOUNTER — Telehealth: Payer: Self-pay

## 2021-04-15 NOTE — Telephone Encounter (Signed)
I spoke with patient and told her that there was no need for iron per lauren based on iron results. We will reevaluate in December at next appointment.

## 2021-04-17 ENCOUNTER — Other Ambulatory Visit: Payer: Self-pay | Admitting: Nurse Practitioner

## 2021-04-17 DIAGNOSIS — M542 Cervicalgia: Secondary | ICD-10-CM

## 2021-04-18 ENCOUNTER — Telehealth: Payer: Self-pay

## 2021-04-18 ENCOUNTER — Other Ambulatory Visit: Payer: Self-pay

## 2021-04-19 ENCOUNTER — Other Ambulatory Visit: Payer: Self-pay

## 2021-04-19 MED ORDER — GABAPENTIN 100 MG PO CAPS: ORAL_CAPSULE | ORAL | 1 refills | Status: DC

## 2021-04-19 NOTE — Telephone Encounter (Signed)
As per alyssa send gabapentin 100 mg take 2 tab three times a day

## 2021-04-23 ENCOUNTER — Other Ambulatory Visit: Payer: Self-pay | Admitting: Nurse Practitioner

## 2021-04-23 DIAGNOSIS — M542 Cervicalgia: Secondary | ICD-10-CM

## 2021-04-23 DIAGNOSIS — G6189 Other inflammatory polyneuropathies: Secondary | ICD-10-CM

## 2021-04-29 ENCOUNTER — Encounter: Payer: Self-pay | Admitting: Nurse Practitioner

## 2021-04-29 MED ORDER — GABAPENTIN 100 MG PO CAPS: 200.0000 mg | ORAL_CAPSULE | Freq: Three times a day (TID) | ORAL | 0 refills | Status: DC

## 2021-05-08 ENCOUNTER — Other Ambulatory Visit: Payer: Self-pay | Admitting: Physician Assistant

## 2021-05-08 DIAGNOSIS — M0579 Rheumatoid arthritis with rheumatoid factor of multiple sites without organ or systems involvement: Secondary | ICD-10-CM

## 2021-05-09 NOTE — Telephone Encounter (Signed)
Last Visit: 12/21/2020   Next Visit: 05/24/2021   Labs: 01/17/2021 Chloride 111   Eye exam: 09/08/2020 WNL    Current Dose per office note 12/21/2020: Plaquenil 200 mg 1 tablet by mouth twice daily.   DX: Rheumatoid arthritis involving multiple sites with positive rheumatoid factor   Last Fill: 02/09/2021   Okay to refill Plaquenil?

## 2021-05-10 ENCOUNTER — Inpatient Hospital Stay: Payer: Medicare HMO

## 2021-05-11 ENCOUNTER — Inpatient Hospital Stay: Payer: Medicare HMO | Attending: Oncology

## 2021-05-11 DIAGNOSIS — E538 Deficiency of other specified B group vitamins: Secondary | ICD-10-CM | POA: Insufficient documentation

## 2021-05-11 DIAGNOSIS — Z79899 Other long term (current) drug therapy: Secondary | ICD-10-CM | POA: Insufficient documentation

## 2021-05-11 NOTE — Progress Notes (Signed)
Office Visit Note  Patient: Jamie Bennett             Date of Birth: Sep 20, 1960           MRN: [id number]             PCP: Lavera Guise, MD Referring: Lavera Guise, MD Visit Date: 05/24/2021 Occupation: @GUAROCC @  Subjective:  Other (Patient reports recent fall and she is experiencing left knee pain, patient states that she has seen ortho and is currently awaiting an MRI. )   History of Present Illness: Jamie Bennett is a 60 y.o. female with a history of rheumatoid arthritis and osteoarthritis.  She states recently she has been having increased discomfort in her hands and her knees.  She states she fell on the first week of September.  She landed on her left knee.  She was seen at an urgent care where the x-rays were unremarkable.  Then she was evaluated by an orthopedic surgeon in [place].  She was told that she possibly had a meniscal tear.  MRI was declined by the insurance.  She is awaiting for approval on the MRI.  She continues to have pain and discomfort in her left knee joint and instability.  She has been under also a lot of stress as her mother-in-law is in assisted living and is not doing well.  She has been traveling back and forth to [place] every day.  Activities of Daily Living:  Patient reports morning stiffness for 2-3 hours.   Patient Denies nocturnal pain.  Difficulty dressing/grooming: Reports Difficulty climbing stairs: Reports Difficulty getting out of chair: Reports Difficulty using hands for taps, buttons, cutlery, and/or writing: Denies  Review of Systems  Constitutional:  Positive for fatigue.  HENT:  Negative for mouth sores, mouth dryness and nose dryness.   Eyes:  Positive for itching. Negative for pain and dryness.  Respiratory:  Positive for shortness of breath. Negative for difficulty breathing.   Cardiovascular:  Positive for palpitations. Negative for chest pain.  Gastrointestinal:  Positive for constipation and diarrhea. Negative for  blood in stool.  Endocrine: Negative for increased urination.  Genitourinary:  Negative for difficulty urinating.  Musculoskeletal:  Positive for joint pain, joint pain, joint swelling and morning stiffness. Negative for myalgias, muscle tenderness and myalgias.  Skin:  Positive for rash. Negative for color change.  Allergic/Immunologic: Negative for susceptible to infections.  Neurological:  Positive for dizziness, headaches and weakness. Negative for numbness and memory loss.  Hematological:  Positive for bruising/bleeding tendency.  Psychiatric/Behavioral:  Negative for confusion.    PMFS History:  Patient Active Problem List   Diagnosis Date Noted   Neck pain, musculoskeletal 03/24/2020   Bipolar affective disorder, current episode mixed ([place]) 11/09/2019   Encounter for general adult medical examination with abnormal findings 06/15/2019   Closed fracture of left elbow 06/15/2019   Urinary tract infection without hematuria 06/15/2019   Encounter for long-term (current) use of medications 06/15/2019   Acute upper respiratory infection 06/06/2019   Exposure to COVID-19 virus 06/06/2019   Exercise-induced asthma 12/15/2018   Screening for breast cancer 05/29/2018   Duodenal ulcer disease 05/29/2018   Screening for malignant neoplasm of cervix 05/29/2018   Dysuria 05/29/2018   Surgical wound dehiscence, initial encounter 04/14/2018   Postoperative abdominal hernia with obstruction    Incarcerated ventral hernia 03/22/2018   SBO (small bowel obstruction) ([place]) 03/07/2018   Abscess of abdominal wall 03/01/2018   Persistent umbilical sinus 48/25/0037  Ringworm of body 01/23/2018   Atopic dermatitis 12/05/2017   Adjustment disorder with mixed anxiety and depressed mood 03/10/2017   Major depressive disorder 03/10/2017   Primary osteoarthritis of both knees 01/19/2017   Suicide attempt ([place]) 07/10/2016   Fibromyalgia 07/10/2016   High risk medication use 07/10/2016   AKI (acute  kidney injury) ([place]) 11/09/2015   Elevated troponin 11/09/2015   Hypotension 11/09/2015   Respiratory failure ([place])    Acute hepatic failure 11/08/2015   Drug overdose 11/08/2015   Fatty infiltration of liver 02/16/2015   Hepatic fibrosis 02/16/2015   Abnormal serum level of alkaline phosphatase 02/15/2015   Iron deficiency anemia 02/05/2015   Vitamin B 12 deficiency 02/05/2015   OP (osteoporosis) 06/16/2014   Rheumatoid arteritis ([place]) 03/02/2014   Hypothyroidism 03/02/2014   Rheumatic fever without heart involvement 03/02/2014   Adult hypothyroidism 03/02/2014   Arthritis of pelvic region, degenerative 03/02/2014   Bipolar 1 disorder, depressed ([place]) 02/26/2014   Bariatric surgery status 11/24/2013   Affective bipolar disorder ([place]) 11/24/2013   Bipolar affective disorder ([place]) 11/24/2013   Rheumatoid arthritis ([place]) 11/24/2013   Polysubstance (excluding opioids) dependence ([place]) 09/11/2013   Polysubstance dependence ([place]) 09/11/2013   Combined drug dependence excluding opioids ([place]) 09/11/2013   Arthritis or polyarthritis, rheumatoid ([place]) 09/05/2013   Leg weakness 09/05/2013    Past Medical History:  Diagnosis Date   Anemia    Anxiety    Bipolar 1 disorder (HCC)    Collagen vascular disease ([place])    rhematoid arthritis   Constipation    Fibromyalgia    GERD (gastroesophageal reflux disease)    Heart murmur    mild-asymptomatic   Hepatic steatosis    History of Roux-en-Y gastric bypass    Hypotension    Hypothyroidism    Opioid abuse (HCC)    Osteoarthritis    Osteoporosis    PONV (postoperative nausea and vomiting)    nausea only   Rheumatic fever    Rheumatoid arthritis ([place])    Thyroid disease     Family History  Problem Relation Age of Onset   Depression Mother    Dementia Mother    Heart disease Mother    Osteoporosis Mother    Parkinson's disease Father    Liver disease Brother    Colon cancer Neg Hx    Past Surgical History:  Procedure  Laterality Date   CHOLECYSTECTOMY  2004   COLONOSCOPY N/A 07/16/2017   Procedure: COLONOSCOPY;  Surgeon: Manya Silvas, MD;  [facility]COPY;  Service: Endoscopy;  Laterality: N/A;   EXPLORATORY LAPAROTOMY  2009   GASTRIC BYPASS  2003   [place] N/A 03/01/2018   Procedure: EXPLORATORY LAPAROTOMY/ UMBILECTOMY;  Surgeon: Robert Bellow, MD;  Location: [facility] ORS;  Service: General;  Laterality: N/A;   TONSILLECTOMY     VENTRAL HERNIA REPAIR N/A 03/07/2018   Procedure: HERNIA REPAIR VENTRAL ADULT;  Surgeon: Robert Bellow, MD;  Location: [facility] ORS;  Service: General;  Laterality: N/A;   Social History   Social History Narrative   Not on file   Immunization History  Administered Date(s) Administered   Influenza, Quadrivalent, Recombinant, Inj, Pf 04/26/2019   Influenza,inj,Quad PF,6+ Mos 04/24/2016   Moderna SARS-COV2 Booster Vaccination 01/30/2021, 05/13/2021   Moderna Sars-Covid-2 Vaccination 10/25/2019, 11/22/2019, 06/02/2020   Pneumococcal Polysaccharide-23 03/02/2018     Objective: Vital Signs: BP (!) 147/82 (BP Location: Left Wrist, Patient Position: Sitting, Cuff Size: Normal)   Pulse 69   Ht  5\' 5"  (1.651 m)   Wt (!) 351 lb (159.2 kg)   BMI 58.41 kg/m    Physical Exam Vitals and nursing note reviewed.  Constitutional:      Appearance: She is well-developed.  HENT:     Head: Normocephalic and atraumatic.  Eyes:     Conjunctiva/sclera: Conjunctivae normal.  Cardiovascular:     Rate and Rhythm: Normal rate and regular rhythm.     Heart sounds: Normal heart sounds.  Pulmonary:     Effort: Pulmonary effort is normal.     Breath sounds: Normal breath sounds.  Abdominal:     General: Bowel sounds are normal.     Palpations: Abdomen is soft.  Musculoskeletal:     Cervical back: Normal range of motion.  Lymphadenopathy:     Cervical: No cervical adenopathy.  Skin:    General: Skin is warm and dry.     Capillary Refill: Capillary  refill takes less than 2 seconds.  Neurological:     Mental Status: She is alert and oriented to person, place, and time.  Psychiatric:        Behavior: Behavior normal.     Musculoskeletal Exam: C-spine was in good range of motion.  Thoracic and lumbar spine were difficult to assess due to body habitus.  Shoulder joints, elbow joints, wrist joints, MCPs, PIPs and DIPs with good range of motion with no synovitis.  Good bilateral PIP and DIP thickening.  Hip joints and knee joints with good range of motion.  She had discomfort and crepitus with range of motion of her left knee joint.  There was no tenderness over ankles or MTPs.  CDAI Exam: CDAI Score: 0.4  Patient Global: 2 mm; Provider Global: 2 mm Swollen: 0 ; Tender: 0  Joint Exam 05/24/2021   No joint exam has been documented for this visit   There is currently no information documented on the homunculus. Go to the Rheumatology activity and complete the homunculus joint exam.  Investigation: No additional findings.  Imaging: No results found.  Recent Labs: Lab Results  Component Value Date   WBC 5.0 04/12/2021   HGB 12.7 04/12/2021   PLT 234 04/12/2021   NA 139 04/12/2021   K 3.8 04/12/2021   CL 108 04/12/2021   CO2 25 04/12/2021   GLUCOSE 93 04/12/2021   BUN 19 04/12/2021   CREATININE 0.88 04/12/2021   BILITOT 0.6 04/12/2021   ALKPHOS 94 04/12/2021   AST 22 04/12/2021   ALT 16 04/12/2021   PROT 7.5 04/12/2021   ALBUMIN 4.3 04/12/2021   CALCIUM 9.1 04/12/2021   GFRAA 99 05/26/2020    Speciality Comments: PLQ Eye Exam: 03/24/2021 WNL @ [facility] follow up in 6 months.  Procedures:  No procedures performed Allergies: Lactose intolerance (gi) and Sulfa antibiotics   Assessment / Plan:     Visit Diagnoses: Rheumatoid arthritis involving multiple sites with positive rheumatoid factor (HCC)-she is on Plaquenil 200 mg p.o. twice daily.  She complains of pain and discomfort in multiple joints and swelling.   No synovitis was noted.  High risk medication use - Plaquenil 200 mg 1 tablet by mouth twice daily.  PLQ Eye Exam: 03/24/2021.  Labs obtained on April 12, 2021 CBC with differential and CMP with GFR were reviewed which were within normal limits.  She was advised to get repeat labs and February which will include CBC with differential and CMP with GFR.  Information regarding immunization was placed in the AVS and reviewed.  Acute pain of left knee-she had a recent fall and injury to her left knee joint.  She states that she was evaluated by an orthopedic surgeon and meniscal tear is suspected.  MRI was declined by the insurance.  She is awaiting on the appeal for the MRI.  She is having a lot of discomfort instability in her left knee joint.  I advised her to use a cane.  She has a cane at home.  Primary osteoarthritis of both knees - S/p orthovisc bilateral knees 08/2020-09/2020.   DDD (degenerative disc disease), cervical-she continues to have some stiffness.  She has good range of motion of her cervical spine.  DDD (degenerative disc disease), thoracic-chronic pain  DDD (degenerative disc disease), lumbar-chronic pain  Other osteoporosis without current pathological fracture - DEXA is not in epic for review.  She is on Prolia 60 mg subcutaneous injections every 6 months.  Fibromyalgia-she continues to have generalized pain and discomfort.  She has positive tender points.  Need for regular exercise and stretching was emphasized.  Trapezius muscle spasm -Necko stretching exercises demonstrated.  She is on tizanidine 4 mg 3 times daily as needed for muscle spasms.  Other medical problems are listed as follows:  History of gastroesophageal reflux (GERD)-s/p gastric bypass 2003.  History of hypothyroidism  History of anemia  History of bipolar disorder  Orders: No orders of the defined types were placed in this encounter.  No orders of the defined types were placed in this  encounter.   Follow-Up Instructions: Return in about 5 months (around 10/22/2021) for Rheumatoid arthritis.   Bo Merino, MD  Note - This record has been created using Editor, commissioning.  Chart creation errors have been sought, but may not always  have been located. Such creation errors do not reflect on  the standard of medical care.

## 2021-05-13 ENCOUNTER — Inpatient Hospital Stay: Payer: Medicare HMO

## 2021-05-13 DIAGNOSIS — E538 Deficiency of other specified B group vitamins: Secondary | ICD-10-CM

## 2021-05-13 DIAGNOSIS — Z79899 Other long term (current) drug therapy: Secondary | ICD-10-CM | POA: Diagnosis not present

## 2021-05-13 MED ORDER — CYANOCOBALAMIN 1000 MCG/ML IJ SOLN
1000.0000 ug | Freq: Once | INTRAMUSCULAR | Status: AC
  Administered 2021-05-13: 1000 ug via INTRAMUSCULAR
  Filled 2021-05-13: qty 1

## 2021-05-21 ENCOUNTER — Other Ambulatory Visit: Payer: Self-pay | Admitting: Nurse Practitioner

## 2021-05-21 ENCOUNTER — Other Ambulatory Visit (HOSPITAL_COMMUNITY): Payer: Self-pay | Admitting: Psychiatry

## 2021-05-21 DIAGNOSIS — G6189 Other inflammatory polyneuropathies: Secondary | ICD-10-CM

## 2021-05-21 DIAGNOSIS — M542 Cervicalgia: Secondary | ICD-10-CM

## 2021-05-21 DIAGNOSIS — F411 Generalized anxiety disorder: Secondary | ICD-10-CM

## 2021-05-24 ENCOUNTER — Encounter: Payer: Self-pay | Admitting: Rheumatology

## 2021-05-24 ENCOUNTER — Other Ambulatory Visit (HOSPITAL_COMMUNITY): Payer: Self-pay | Admitting: *Deleted

## 2021-05-24 ENCOUNTER — Ambulatory Visit: Payer: Medicare HMO | Admitting: Rheumatology

## 2021-05-24 ENCOUNTER — Other Ambulatory Visit: Payer: Self-pay

## 2021-05-24 VITALS — BP 147/82 | HR 69 | Ht 65.0 in | Wt 351.0 lb

## 2021-05-24 DIAGNOSIS — M62838 Other muscle spasm: Secondary | ICD-10-CM

## 2021-05-24 DIAGNOSIS — M5030 Other cervical disc degeneration, unspecified cervical region: Secondary | ICD-10-CM

## 2021-05-24 DIAGNOSIS — F411 Generalized anxiety disorder: Secondary | ICD-10-CM

## 2021-05-24 DIAGNOSIS — M51369 Other intervertebral disc degeneration, lumbar region without mention of lumbar back pain or lower extremity pain: Secondary | ICD-10-CM

## 2021-05-24 DIAGNOSIS — Z8639 Personal history of other endocrine, nutritional and metabolic disease: Secondary | ICD-10-CM

## 2021-05-24 DIAGNOSIS — M797 Fibromyalgia: Secondary | ICD-10-CM

## 2021-05-24 DIAGNOSIS — M5134 Other intervertebral disc degeneration, thoracic region: Secondary | ICD-10-CM | POA: Diagnosis not present

## 2021-05-24 DIAGNOSIS — M0579 Rheumatoid arthritis with rheumatoid factor of multiple sites without organ or systems involvement: Secondary | ICD-10-CM | POA: Diagnosis not present

## 2021-05-24 DIAGNOSIS — Z862 Personal history of diseases of the blood and blood-forming organs and certain disorders involving the immune mechanism: Secondary | ICD-10-CM

## 2021-05-24 DIAGNOSIS — M5136 Other intervertebral disc degeneration, lumbar region: Secondary | ICD-10-CM | POA: Diagnosis not present

## 2021-05-24 DIAGNOSIS — M25562 Pain in left knee: Secondary | ICD-10-CM

## 2021-05-24 DIAGNOSIS — M7062 Trochanteric bursitis, left hip: Secondary | ICD-10-CM

## 2021-05-24 DIAGNOSIS — Z8719 Personal history of other diseases of the digestive system: Secondary | ICD-10-CM

## 2021-05-24 DIAGNOSIS — M170 Bilateral primary osteoarthritis of knee: Secondary | ICD-10-CM | POA: Diagnosis not present

## 2021-05-24 DIAGNOSIS — M818 Other osteoporosis without current pathological fracture: Secondary | ICD-10-CM

## 2021-05-24 DIAGNOSIS — Z79899 Other long term (current) drug therapy: Secondary | ICD-10-CM | POA: Diagnosis not present

## 2021-05-24 DIAGNOSIS — Z8659 Personal history of other mental and behavioral disorders: Secondary | ICD-10-CM

## 2021-05-24 MED ORDER — BUSPIRONE HCL 30 MG PO TABS: ORAL_TABLET | ORAL | 0 refills | Status: DC

## 2021-05-24 NOTE — Patient Instructions (Signed)
Standing Labs We placed an order today for your standing lab work.   Please have your standing labs drawn in February  If possible, please have your labs drawn 2 weeks prior to your appointment so that the provider can discuss your results at your appointment.  Please note that you may see your imaging and lab results in [place] before we have reviewed them. We may be awaiting multiple results to interpret others before contacting you. Please allow our office up to 72 hours to thoroughly review all of the results before contacting the office for clarification of your results.  We have open lab daily: Monday through Thursday from 1:30-4:30 PM and Friday from 1:30-4:00 PM at the office of Dr. Bo Merino, [facility].   Please be advised, all patients with office appointments requiring lab work will take precedent over walk-in lab work.  If possible, please come for your lab work on Monday and Friday afternoons, as you may experience shorter wait times. The office is located at [address], [place], [place], [place] [address] No appointment is necessary.   Labs are drawn by [facility]. Please bring your co-pay at the time of your lab draw.  You may receive a bill from [place] for your lab work.  If you wish to have your labs drawn at another location, please call the office 24 hours in advance to send orders.  If you have any questions regarding directions or hours of operation,  please call [phone].   As a reminder, please drink plenty of water prior to coming for your lab work. Thanks!   Vaccines You are taking a medication(s) that can suppress your immune system.  The following immunizations are recommended: Flu annually Covid-19  Td/Tdap (tetanus, diphtheria, pertussis) every 10 years Pneumonia (Prevnar 15 then Pneumovax 23 at least 1 year apart.  Alternatively, can take Prevnar 20 without needing additional dose) Shingrix: 2 doses from 4 weeks to 6 months  apart  Please check with your PCP to make sure you are up to date.

## 2021-05-29 IMAGING — CR DG ELBOW COMPLETE 3+V*L*
1 series · 4 of 4 positions shown · non-contrast
Comparison: 05/16/2019.

CLINICAL DATA: Fall.

EXAM:
LEFT ELBOW - COMPLETE 3+ VIEW

[Series 1: dg elbow complete left (3+view) · 0.14mm/px · 4 of 4 slices shown]
[im 1/4]
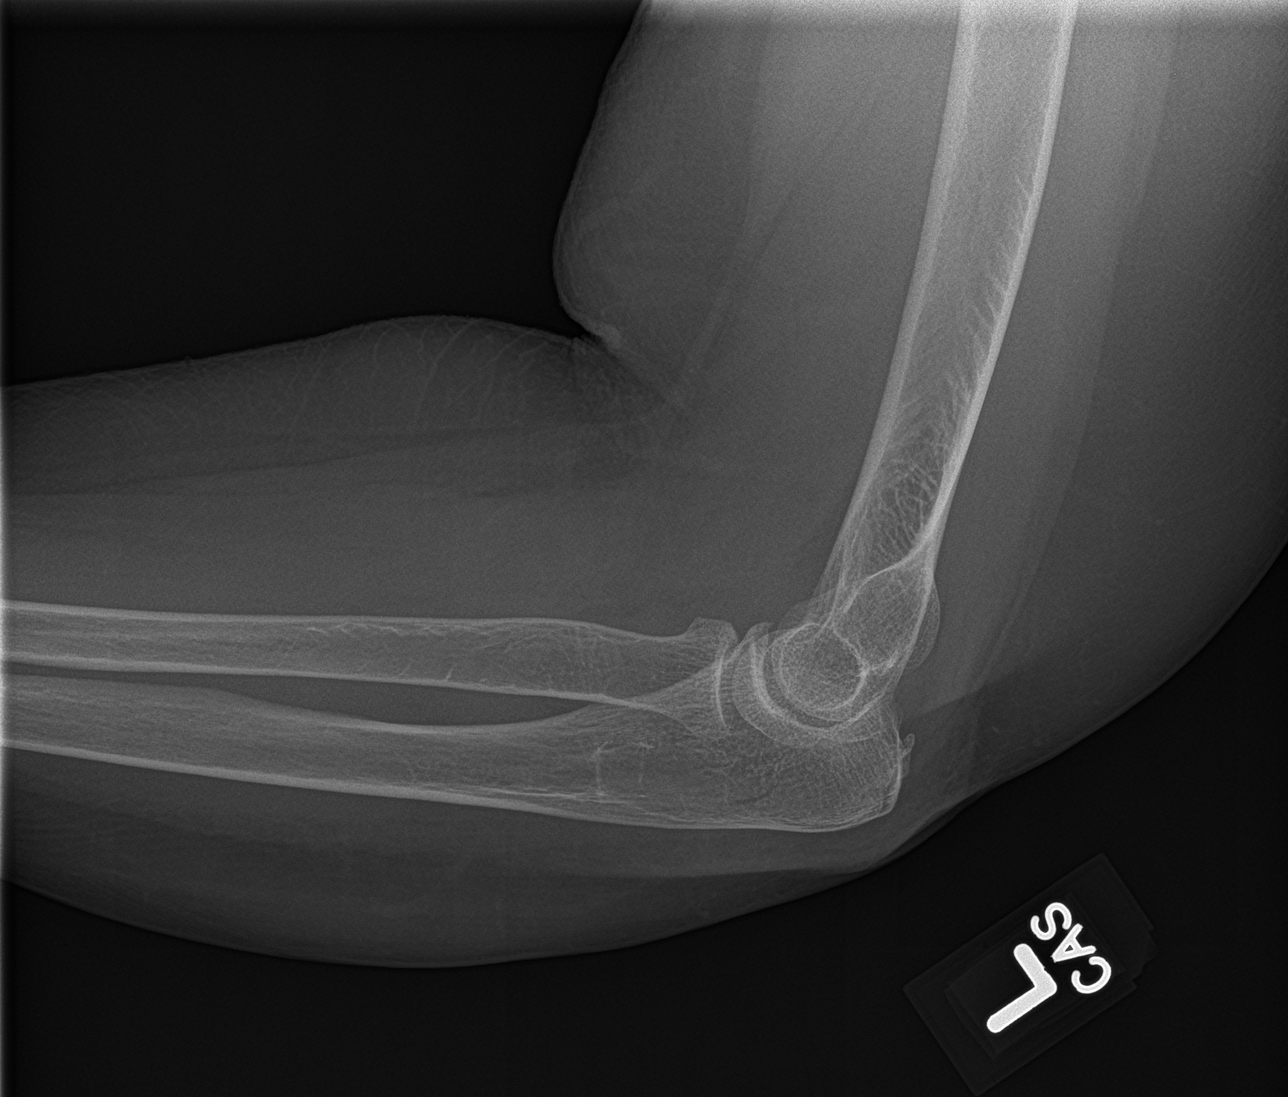
[im 2/4]
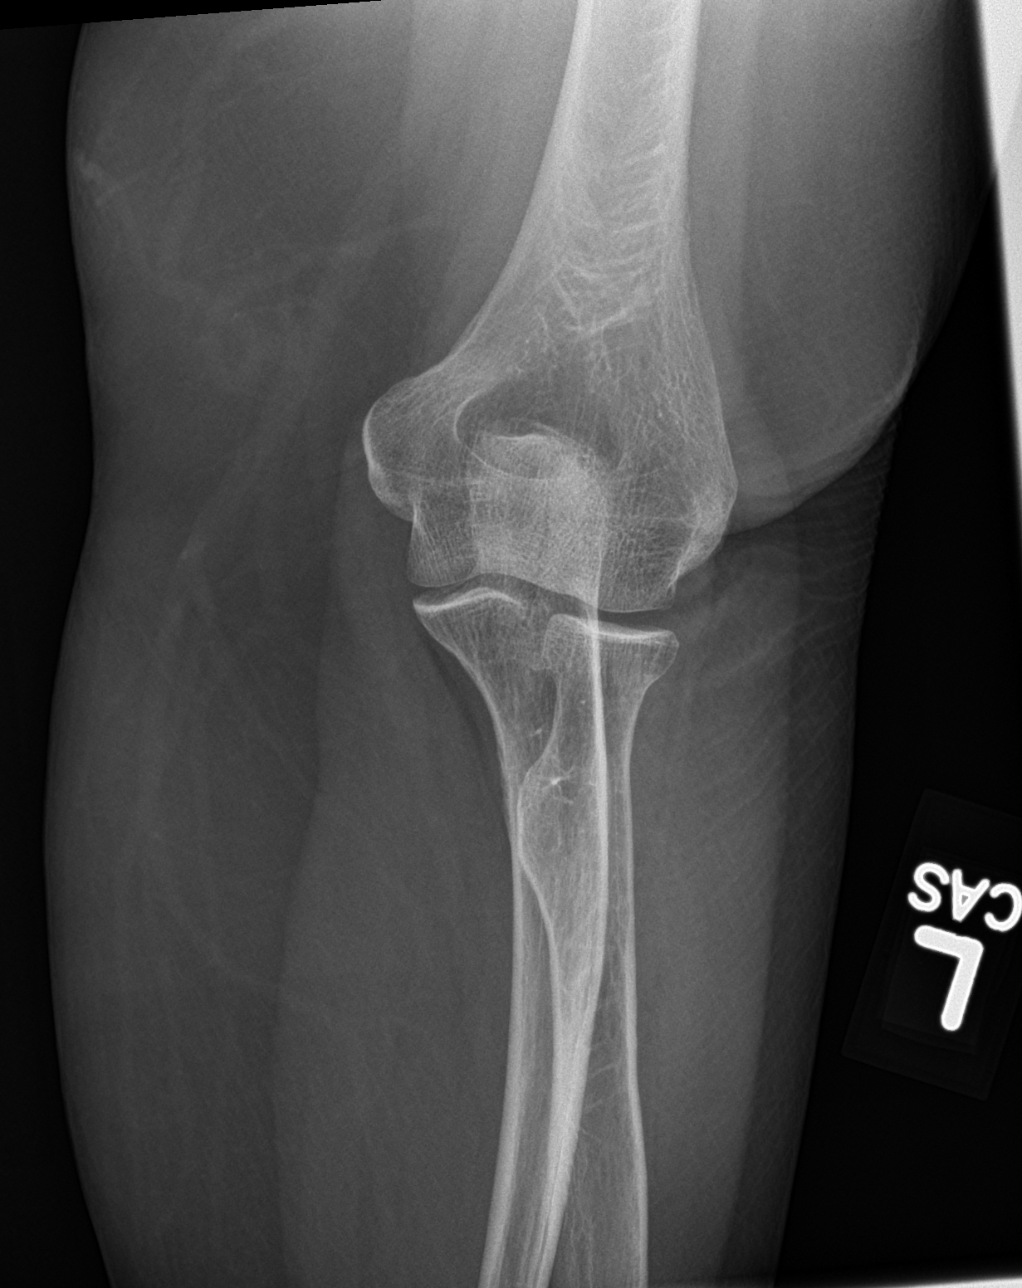
[im 3/4]
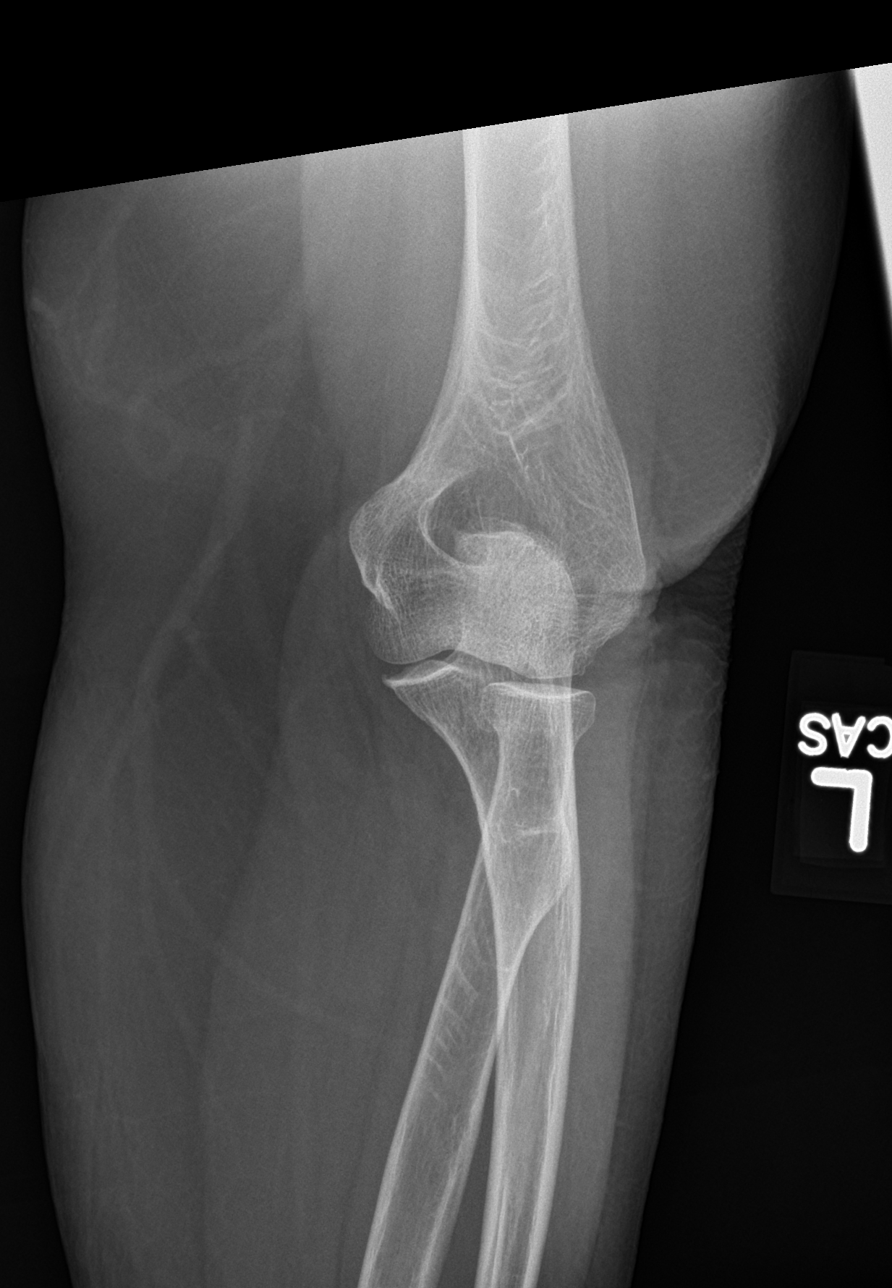
[im 4/4]
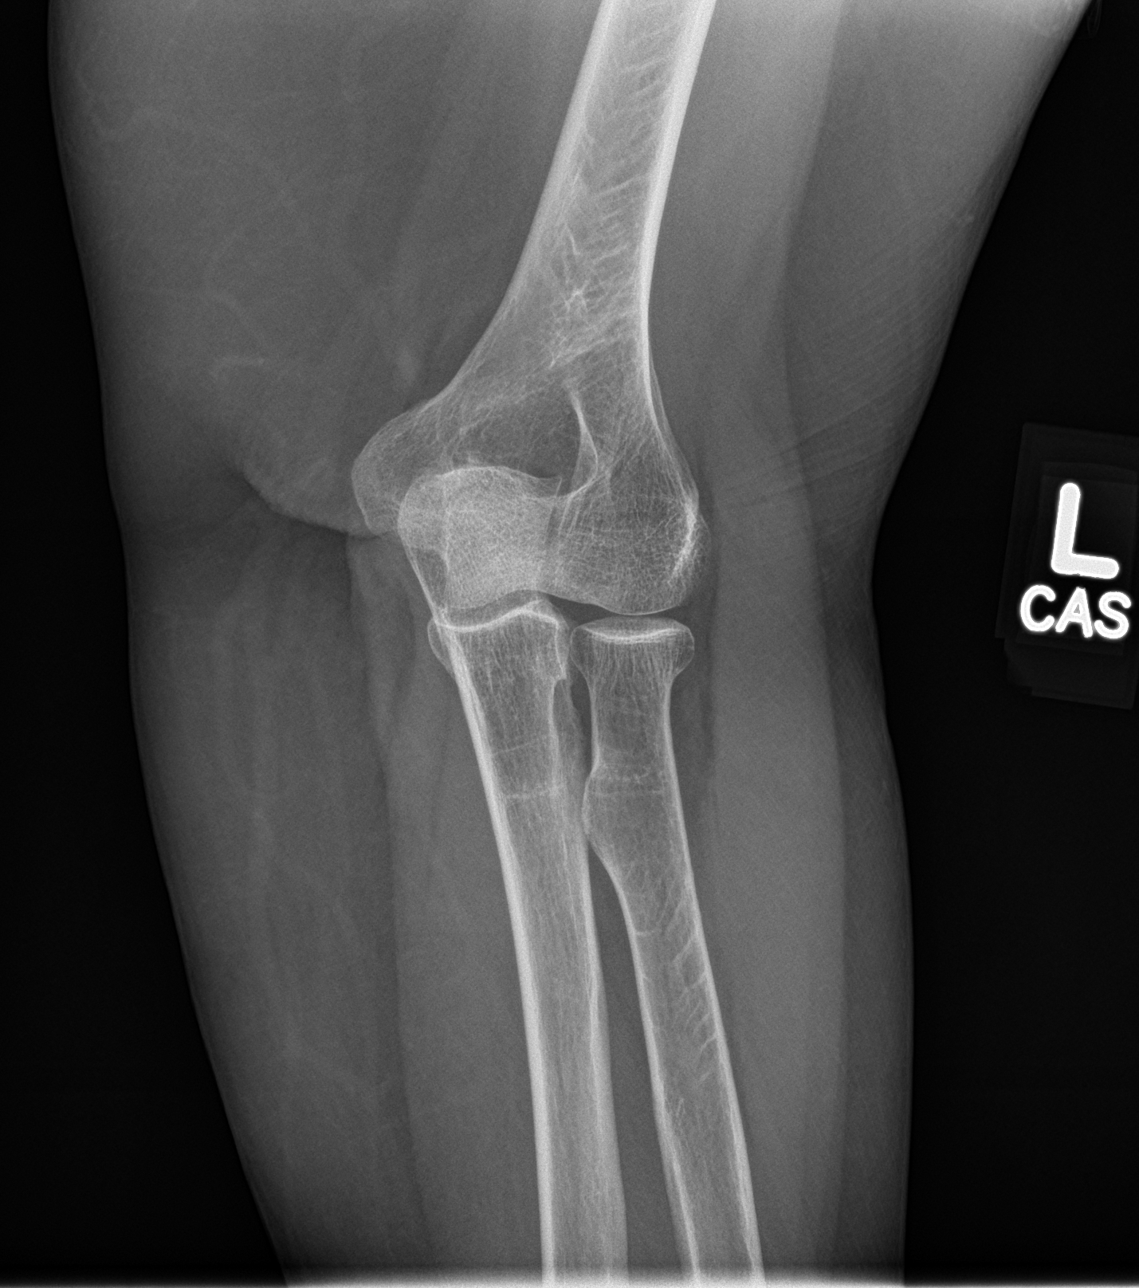

[4 of 4 positions shown; findings below may reference images not displayed]

FINDINGS: Small residual effusion most likely present. Linear lucency noted in
the olecranon consistent with fracture. Fracture is nondisplaced. No
other focal bony abnormalities identified.
IMPRESSION: 1.  Nondisplaced fracture of the olecranon.

2.  Small residual elbow joint effusion.

## 2021-06-02 ENCOUNTER — Encounter: Payer: Self-pay | Admitting: Physician Assistant

## 2021-06-02 ENCOUNTER — Ambulatory Visit (INDEPENDENT_AMBULATORY_CARE_PROVIDER_SITE_OTHER): Payer: Medicare HMO | Admitting: Physician Assistant

## 2021-06-02 ENCOUNTER — Other Ambulatory Visit: Payer: Self-pay

## 2021-06-02 DIAGNOSIS — R062 Wheezing: Secondary | ICD-10-CM

## 2021-06-02 DIAGNOSIS — E039 Hypothyroidism, unspecified: Secondary | ICD-10-CM | POA: Diagnosis not present

## 2021-06-02 DIAGNOSIS — R6889 Other general symptoms and signs: Secondary | ICD-10-CM

## 2021-06-02 DIAGNOSIS — M0579 Rheumatoid arthritis with rheumatoid factor of multiple sites without organ or systems involvement: Secondary | ICD-10-CM

## 2021-06-02 DIAGNOSIS — M25562 Pain in left knee: Secondary | ICD-10-CM | POA: Diagnosis not present

## 2021-06-02 DIAGNOSIS — Z0001 Encounter for general adult medical examination with abnormal findings: Secondary | ICD-10-CM | POA: Diagnosis not present

## 2021-06-02 DIAGNOSIS — Z23 Encounter for immunization: Secondary | ICD-10-CM

## 2021-06-02 DIAGNOSIS — I700 Atherosclerosis of aorta: Secondary | ICD-10-CM | POA: Diagnosis not present

## 2021-06-02 DIAGNOSIS — R300 Dysuria: Secondary | ICD-10-CM

## 2021-06-02 DIAGNOSIS — J029 Acute pharyngitis, unspecified: Secondary | ICD-10-CM

## 2021-06-02 LAB — POCT INFLUENZA A/B
Influenza A, POC: NEGATIVE
Influenza B, POC: NEGATIVE

## 2021-06-02 LAB — POCT RAPID STREP A (OFFICE): Rapid Strep A Screen: NEGATIVE

## 2021-06-02 MED ORDER — ALBUTEROL SULFATE HFA 108 (90 BASE) MCG/ACT IN AERS: 2.0000 | INHALATION_SPRAY | Freq: Four times a day (QID) | RESPIRATORY_TRACT | 0 refills | Status: DC | PRN

## 2021-06-02 MED ORDER — TETANUS-DIPHTH-ACELL PERTUSSIS 5-2.5-18.5 LF-MCG/0.5 IM SUSP: 0.5000 mL | Freq: Once | INTRAMUSCULAR | 0 refills | Status: AC

## 2021-06-02 MED ORDER — ZOSTER VAC RECOMB ADJUVANTED 50 MCG/0.5ML IM SUSR: 0.5000 mL | Freq: Once | INTRAMUSCULAR | 0 refills | Status: AC

## 2021-06-02 NOTE — Progress Notes (Signed)
[facility] [place], [place] [address]  Internal MEDICINE  Office Visit Note  Patient Name: Jamie Bennett  [id number]  [id number]  Date of Service: 06/07/2021  Chief Complaint  Patient presents with   Medicare Wellness   Anxiety   Hypothyroidism   Sore Throat   Sinusitis     HPI Pt is here for routine health maintenance examination as well as a sick visit -Torn meniscuc in L knee, trying to get an US--followed by ortho -Does mammogram through [facility] and states she will schedule this -UTD on colonoscopy -Still followed by rheumatology -toe nail fungus is improving -Gabapentin is working well -Sick visit--sore throat, runny nose, congestion and joint aches since yesterday. Covid negative last night. Flu shot done in sept. And up to date on covid vaccines. No coughing, but is wheezing a little.  -Taking tylenol as needed.  -had labs done via outside provider not long ago -shingles and tdap vaccination orders sent to pharmacy after she is feeling better  Current Medication: Outpatient Encounter Medications as of 06/02/2021  Medication Sig   albuterol (VENTOLIN HFA) 108 (90 Base) MCG/ACT inhaler Inhale 2 puffs into the lungs every 6 (six) hours as needed for wheezing or shortness of breath.   busPIRone (BUSPAR) 30 MG tablet 1 bid   Cholecalciferol 125 MCG (5000 UT) TABS Take 1 tablet by mouth daily.   clonazePAM (KLONOPIN) 0.5 MG tablet TAKE 1 TABLET BY MOUTH ONCE DAILY AS NEEDED FOR  ACUTE  ANXIETY  EMERGENCIES  ONLY  DO  NOT  EXCEED  ONE  PER  DAY   Coenzyme Q10 300 MG CAPS Take by mouth.   cyanocobalamin (,VITAMIN B-12,) 1000 MCG/ML injection Inject 1,000 mcg into the muscle every 30 (thirty) days.    denosumab (PROLIA) 60 MG/ML SOSY injection Inject 60 mg into the skin every 6 (six) months.   desipramine (NORPRAMIN) 25 MG tablet Take 1 tablet (25 mg total) by mouth daily.   esomeprazole (NEXIUM) 20 MG capsule Take 20 mg by mouth  daily.    gabapentin (NEURONTIN) 100 MG capsule Take 2 tab po three times daily   hydroxychloroquine (PLAQUENIL) 200 MG tablet TAKE ONE TABLET BY MOUTH TWICE A DAY   hydrOXYzine (ATARAX/VISTARIL) 50 MG tablet 1  Tid   1  Prn q day   ketoconazole (NIZORAL) 2 % cream Apply 1 application topically daily. To affected area For 6 weeks or until rash resolves.   Krill Oil 500 MG CAPS Take 500 mg by mouth daily.   lamoTRIgine (LAMICTAL) 150 MG tablet Take 1 tablet (150 mg total) by mouth 2 (two) times daily.   levothyroxine (SYNTHROID) 112 MCG tablet TAKE 1 TABLET DAILY   loratadine (CLARITIN) 10 MG tablet Take 10 mg by mouth daily as needed for allergies.   magnesium oxide (MAG-OX) 400 MG tablet Take 400 mg by mouth daily.   Multiple Vitamin (MULTIVITAMIN) tablet Take 1 tablet by mouth daily.   nitroGLYCERIN (NITROSTAT) 0.4 MG SL tablet Place 1 tablet (0.4 mg total) under the tongue every 5 (five) minutes as needed for chest pain.   polyethylene glycol (MIRALAX) packet Take 17 g by mouth daily. (Patient taking differently: Take 17 g by mouth as needed.)   psyllium (METAMUCIL) 58.6 % packet Take 1 packet by mouth daily.   sucralfate (CARAFATE) 1 g tablet Take 1 tablet (1 g total) by mouth 2 (two) times daily.   terbinafine (LAMISIL) 250 MG tablet TAKE 1 TABLET BY MOUTH  ONCE DAILY FOR  12  WEEKS  FOR  TOENAIL  FUNGAL  INFECTION   tiZANidine (ZANAFLEX) 4 MG tablet TAKE 1 TABLET BY MOUTH THREE TIMES DAILY   traZODone (DESYREL) 100 MG tablet Take 1 tablet (100 mg total) by mouth at bedtime as needed and may repeat dose one time if needed for sleep.   venlafaxine (EFFEXOR) 100 MG tablet Take 1 tablet (100 mg total) by mouth 3 (three) times daily with meals.   vitamin C (ASCORBIC ACID) 500 MG tablet Take 500 mg by mouth daily.   zinc gluconate 50 MG tablet Take 50 mg by mouth daily.   [DISCONTINUED] Tdap (BOOSTRIX) 5-2.5-18.5 LF-MCG/0.5 injection Inject 0.5 mLs into the muscle once.   [DISCONTINUED] Zoster  Vaccine Adjuvanted [facility]) injection Inject 0.5 mLs into the muscle once.   gabapentin (NEURONTIN) 100 MG capsule Take 2 capsules (200 mg total) by mouth 3 (three) times daily.   [EXPIRED] Tdap (BOOSTRIX) 5-2.5-18.5 LF-MCG/0.5 injection Inject 0.5 mLs into the muscle once for 1 dose.   [EXPIRED] Zoster Vaccine Adjuvanted [facility]) injection Inject 0.5 mLs into the muscle once for 1 dose.   No facility-administered encounter medications on file as of 06/02/2021.    Surgical History: Past Surgical History:  Procedure Laterality Date   CHOLECYSTECTOMY  2004   COLONOSCOPY N/A 07/16/2017   Procedure: COLONOSCOPY;  Surgeon: Manya Silvas, MD;  Location: [facility] ENDOSCOPY;  Service: Endoscopy;  Laterality: N/A;   EXPLORATORY LAPAROTOMY  2009   GASTRIC BYPASS  2003   [place] N/A 03/01/2018   Procedure: EXPLORATORY LAPAROTOMY/ UMBILECTOMY;  Surgeon: Robert Bellow, MD;  Location: [facility] ORS;  Service: General;  Laterality: N/A;   TONSILLECTOMY     VENTRAL HERNIA REPAIR N/A 03/07/2018   Procedure: HERNIA REPAIR VENTRAL ADULT;  Surgeon: Robert Bellow, MD;  Location: [facility] ORS;  Service: General;  Laterality: N/A;    Medical History: Past Medical History:  Diagnosis Date   Anemia    Anxiety    Bipolar 1 disorder ([place])    Collagen vascular disease ([place])    rhematoid arthritis   Constipation    Fibromyalgia    GERD (gastroesophageal reflux disease)    Heart murmur    mild-asymptomatic   Hepatic steatosis    History of Roux-en-Y gastric bypass    Hypotension    Hypothyroidism    Opioid abuse ([facility])    Osteoarthritis    Osteoporosis    PONV (postoperative nausea and vomiting)    nausea only   Rheumatic fever    Rheumatoid arthritis ([place])    Thyroid disease     Family History: Family History  Problem Relation Age of Onset   Depression Mother    Dementia Mother    Heart disease Mother    Osteoporosis Mother    Parkinson's disease Father    Liver  disease Brother    Colon cancer Neg Hx       Review of Systems  Constitutional:  Negative for chills, fatigue and unexpected weight change.  HENT:  Positive for congestion, rhinorrhea and sore throat. Negative for sneezing.   Eyes:  Negative for redness.  Respiratory:  Positive for wheezing. Negative for cough, chest tightness and shortness of breath.   Cardiovascular:  Negative for chest pain and palpitations.  Gastrointestinal:  Negative for abdominal pain, constipation, diarrhea, nausea and vomiting.  Genitourinary:  Negative for dysuria and frequency.  Musculoskeletal:  Positive for arthralgias and myalgias. Negative for back pain, joint swelling  and neck pain.  Skin:  Negative for rash.  Neurological: Negative.  Negative for tremors and numbness.       Neuropathic pain in legs  Hematological:  Negative for adenopathy. Does not bruise/bleed easily.  Psychiatric/Behavioral:  Negative for behavioral problems (Depression), sleep disturbance and suicidal ideas. The patient is not nervous/anxious.     Vital Signs: BP 140/80   Pulse 65   Temp 98 F (36.7 C)   Resp 16   Ht 5\' 5"  (1.651 m)   Wt (!) 351 lb (159.2 kg)   SpO2 95%   BMI 58.41 kg/m    Physical Exam Vitals and nursing note reviewed.  Constitutional:      General: She is not in acute distress.    Appearance: Normal appearance. She is obese. She is not ill-appearing.  HENT:     Head: Normocephalic and atraumatic.     Right Ear: External ear normal.     Left Ear: External ear normal.     Nose: Congestion present.     Mouth/Throat:     Pharynx: Posterior oropharyngeal erythema present.  Eyes:     Extraocular Movements: Extraocular movements intact.     Pupils: Pupils are equal, round, and reactive to light.  Cardiovascular:     Rate and Rhythm: Normal rate and regular rhythm.  Pulmonary:     Effort: Pulmonary effort is normal. No respiratory distress.  Abdominal:     General: Bowel sounds are normal.      Tenderness: There is no abdominal tenderness.  Musculoskeletal:        General: Normal range of motion.     Cervical back: Normal range of motion.  Skin:    General: Skin is warm and dry.  Neurological:     Mental Status: She is alert and oriented to person, place, and time.     Cranial Nerves: No cranial nerve deficit.     Coordination: Coordination normal.     Gait: Gait normal.  Psychiatric:        Mood and Affect: Mood normal.        Behavior: Behavior normal.        Thought Content: Thought content normal.        Judgment: Judgment normal.     LABS: Recent Results (from the past 2160 hour(s))  Vitamin B12     Status: Abnormal   Collection Time: 04/12/21  1:34 PM  Result Value Ref Range   Vitamin B-12 1,950 (H) 180 - 914 pg/mL    Comment: (NOTE) This assay is not validated for testing neonatal or myeloproliferative syndrome specimens for Vitamin B12 levels. Performed at [facility], [place] [address] [place], [place] [address]   Iron and TIBC     Status: Abnormal   Collection Time: 04/12/21  1:34 PM  Result Value Ref Range   Iron 81 28 - 170 ug/dL   TIBC 489 (H) 250 - 450 ug/dL   Saturation Ratios 17 10.4 - 31.8 %   UIBC 408 ug/dL    Comment: Performed at [facility], [address] [place], [place] [address]  Ferritin     Status: None   Collection Time: 04/12/21  1:34 PM  Result Value Ref Range   Ferritin 41 11 - 307 ng/mL    Comment: Performed at [facility], [address] [place], [place] [address]  Comprehensive metabolic panel     Status: None   Collection Time: 04/12/21  1:34 PM  Result  Value Ref Range   Sodium 139 135 - 145 mmol/L   Potassium 3.8 3.5 - 5.1 mmol/L   Chloride 108 98 - 111 mmol/L   CO2 25 22 - 32 mmol/L   Glucose, Bld 93 70 - 99 mg/dL    Comment: Glucose reference range applies only to samples taken after fasting for at least 8 hours.   BUN 19 6 - 20 mg/dL   Creatinine, Ser 0.88 0.44 - 1.00 mg/dL   Calcium  9.1 8.9 - 10.3 mg/dL   Total Protein 7.5 6.5 - 8.1 g/dL   Albumin 4.3 3.5 - 5.0 g/dL   AST 22 15 - 41 U/L   ALT 16 0 - 44 U/L   Alkaline Phosphatase 94 38 - 126 U/L   Total Bilirubin 0.6 0.3 - 1.2 mg/dL   GFR, Estimated >60 >60 mL/min    Comment: (NOTE) Calculated using the CKD-EPI Creatinine Equation (2021)    Anion gap 6 5 - 15    Comment: Performed at [facility], [address] [place], [place] [address]  CBC with Differential     Status: None   Collection Time: 04/12/21  1:34 PM  Result Value Ref Range   WBC 5.0 4.0 - 10.5 K/uL   RBC 4.34 3.87 - 5.11 MIL/uL   Hemoglobin 12.7 12.0 - 15.0 g/dL   HCT 39.3 36.0 - 46.0 %   MCV 90.6 80.0 - 100.0 fL   MCH 29.3 26.0 - 34.0 pg   MCHC 32.3 30.0 - 36.0 g/dL   RDW 13.3 11.5 - 15.5 %   Platelets 234 150 - 400 K/uL   nRBC 0.0 0.0 - 0.2 %   Neutrophils Relative % 49 %   Neutro Abs 2.5 1.7 - 7.7 K/uL   Lymphocytes Relative 35 %   Lymphs Abs 1.7 0.7 - 4.0 K/uL   Monocytes Relative 9 %   Monocytes Absolute 0.5 0.1 - 1.0 K/uL   Eosinophils Relative 5 %   Eosinophils Absolute 0.3 0.0 - 0.5 K/uL   Basophils Relative 1 %   Basophils Absolute 0.0 0.0 - 0.1 K/uL   Immature Granulocytes 1 %   Abs Immature Granulocytes 0.03 0.00 - 0.07 K/uL    Comment: Performed at [facility], [address] [place], [place] [address]  POCT Influenza A/B     Status: None   Collection Time: 06/02/21  3:19 PM  Result Value Ref Range   Influenza A, POC Negative Negative   Influenza B, POC Negative Negative  POCT rapid strep A     Status: None   Collection Time: 06/02/21  3:19 PM  Result Value Ref Range   Rapid Strep A Screen Negative Negative  UA/M w/rflx Culture, Routine     Status: None   Collection Time: 06/02/21  4:45 PM   Specimen: Urine   Urine  Result Value Ref Range   Specific Gravity, UA 1.015 1.005 - 1.030   pH, UA 5.5 5.0 - 7.5   Color, UA Yellow Yellow   Appearance Ur Clear Clear   Leukocytes,UA Negative Negative    Protein,UA Negative Negative/Trace   Glucose, UA Negative Negative   Ketones, UA Negative Negative   RBC, UA Negative Negative   Bilirubin, UA Negative Negative   Urobilinogen, Ur 0.2 0.2 - 1.0 mg/dL   Nitrite, UA Negative Negative   Microscopic Examination Comment     Comment: Microscopic follows if indicated.   Microscopic Examination See below:     Comment: Microscopic  was indicated and was performed.   Urinalysis Reflex Comment     Comment: This specimen will not reflex to a Urine Culture.  Microscopic Examination     Status: None   Collection Time: 06/02/21  4:45 PM   Urine  Result Value Ref Range   WBC, UA None seen 0 - 5 /hpf   RBC None seen 0 - 2 /hpf   Epithelial Cells (non renal) None seen 0 - 10 /hpf   Casts None seen None seen /lpf   Bacteria, UA None seen None seen/Few        Assessment/Plan: 1. Encounter for general adult medical examination with abnormal findings CPE performed, recent labs from [facility] provider already reviewed, will schedule mammogram and UTD on other PHM  2. Acquired hypothyroidism Continue synthroid, will need to update labs next visit  3. Acute pain of left knee Followed by ortho, per pt she is awaiting [place] since Mri not approved  4. Rheumatoid arthritis involving multiple sites with positive rheumatoid factor ([place]) Followed by rheumatology  5. Aortic atherosclerosis ([place]) Carotid US ordered last visit, but not done--will need to schedule and recheck lipids next visit  6. Wheezing May use albuterol as needed - albuterol (VENTOLIN HFA) 108 (90 Base) MCG/ACT inhaler; Inhale 2 puffs into the lungs every 6 (six) hours as needed for wheezing or shortness of breath.  Dispense: 8 g; Refill: 0  7. Sore throat Advised to gargle salt water, drink warm tea with honey, and utilize lozenges. Will call if worsening symptoms - POCT rapid strep A negative  8. Flu-like symptoms Stay well hydrated and rest, may take mucinex and flonase for  congestion and tylenol for body aches or any fevers. Will call if worsening - POCT Influenza A/B negative  9. Dysuria - UA/M w/rflx Culture, Routine     General Counseling: Jamie Bennett verbalizes understanding of the findings of todays visit and agrees with plan of treatment. I have discussed any further diagnostic evaluation that may be needed or ordered today. We also reviewed her medications today. she has been encouraged to call the office with any questions or concerns that should arise related to todays visit.    Counseling:    Orders Placed This Encounter  Procedures   Microscopic Examination   UA/M w/rflx Culture, Routine   POCT Influenza A/B   POCT rapid strep A    Meds ordered this encounter  Medications   Tdap (BOOSTRIX) 5-2.5-18.5 LF-MCG/0.5 injection    Sig: Inject 0.5 mLs into the muscle once for 1 dose.    Dispense:  0.5 mL    Refill:  0   Zoster Vaccine Adjuvanted [facility]) injection    Sig: Inject 0.5 mLs into the muscle once for 1 dose.    Dispense:  0.5 mL    Refill:  0   albuterol (VENTOLIN HFA) 108 (90 Base) MCG/ACT inhaler    Sig: Inhale 2 puffs into the lungs every 6 (six) hours as needed for wheezing or shortness of breath.    Dispense:  8 g    Refill:  0    This patient was seen by Drema Dallas, PA-C in collaboration with Dr. Clayborn Bigness as a part of collaborative care agreement.  Total time spent:35 Minutes  Time spent includes review of chart, medications, test results, and follow up plan with the patient.     Lavera Guise, MD  Internal Medicine

## 2021-06-03 LAB — UA/M W/RFLX CULTURE, ROUTINE
Bilirubin, UA: NEGATIVE
Glucose, UA: NEGATIVE
Ketones, UA: NEGATIVE
Leukocytes,UA: NEGATIVE
Nitrite, UA: NEGATIVE
Protein,UA: NEGATIVE
RBC, UA: NEGATIVE
Specific Gravity, UA: 1.015 (ref 1.005–1.030)
Urobilinogen, Ur: 0.2 mg/dL (ref 0.2–1.0)
pH, UA: 5.5 (ref 5.0–7.5)

## 2021-06-03 LAB — MICROSCOPIC EXAMINATION
Bacteria, UA: NONE SEEN
Casts: NONE SEEN /lpf
Epithelial Cells (non renal): NONE SEEN /hpf (ref 0–10)
RBC, Urine: NONE SEEN /hpf (ref 0–2)
WBC, UA: NONE SEEN /hpf (ref 0–5)

## 2021-06-06 ENCOUNTER — Other Ambulatory Visit: Payer: Self-pay | Admitting: Physician Assistant

## 2021-06-06 ENCOUNTER — Telehealth: Payer: Self-pay

## 2021-06-06 DIAGNOSIS — R300 Dysuria: Secondary | ICD-10-CM

## 2021-06-06 DIAGNOSIS — J029 Acute pharyngitis, unspecified: Secondary | ICD-10-CM

## 2021-06-06 MED ORDER — AMOXICILLIN-POT CLAVULANATE 875-125 MG PO TABS: 1.0000 | ORAL_TABLET | Freq: Two times a day (BID) | ORAL | 0 refills | Status: DC

## 2021-06-06 NOTE — Telephone Encounter (Signed)
Pt advised that we send med  

## 2021-06-07 ENCOUNTER — Inpatient Hospital Stay: Payer: Medicare HMO | Attending: Oncology

## 2021-06-07 ENCOUNTER — Other Ambulatory Visit: Payer: Self-pay

## 2021-06-07 DIAGNOSIS — E538 Deficiency of other specified B group vitamins: Secondary | ICD-10-CM | POA: Diagnosis not present

## 2021-06-07 DIAGNOSIS — Z79899 Other long term (current) drug therapy: Secondary | ICD-10-CM | POA: Insufficient documentation

## 2021-06-07 MED ORDER — CYANOCOBALAMIN 1000 MCG/ML IJ SOLN
1000.0000 ug | Freq: Once | INTRAMUSCULAR | Status: AC
  Administered 2021-06-07: 1000 ug via INTRAMUSCULAR
  Filled 2021-06-07: qty 1

## 2021-06-10 ENCOUNTER — Other Ambulatory Visit: Payer: Self-pay | Admitting: Orthopedic Surgery

## 2021-06-10 DIAGNOSIS — M25562 Pain in left knee: Secondary | ICD-10-CM

## 2021-06-14 ENCOUNTER — Other Ambulatory Visit: Payer: Self-pay | Admitting: Nurse Practitioner

## 2021-06-20 ENCOUNTER — Other Ambulatory Visit: Payer: Self-pay

## 2021-06-20 DIAGNOSIS — M542 Cervicalgia: Secondary | ICD-10-CM

## 2021-06-20 DIAGNOSIS — G6189 Other inflammatory polyneuropathies: Secondary | ICD-10-CM

## 2021-06-20 MED ORDER — TIZANIDINE HCL 4 MG PO TABS: 4.0000 mg | ORAL_TABLET | Freq: Three times a day (TID) | ORAL | 1 refills | Status: DC

## 2021-06-21 ENCOUNTER — Other Ambulatory Visit (HOSPITAL_COMMUNITY): Payer: Self-pay | Admitting: Psychiatry

## 2021-06-21 DIAGNOSIS — F411 Generalized anxiety disorder: Secondary | ICD-10-CM

## 2021-06-22 ENCOUNTER — Ambulatory Visit
Admission: RE | Admit: 2021-06-22 | Discharge: 2021-06-22 | Disposition: A | Discharge status: Home / Self Care | Payer: Medicare HMO | Source: Ambulatory Visit | Attending: Orthopedic Surgery | Admitting: Orthopedic Surgery

## 2021-06-22 ENCOUNTER — Other Ambulatory Visit: Payer: Self-pay

## 2021-06-22 DIAGNOSIS — M25562 Pain in left knee: Secondary | ICD-10-CM | POA: Diagnosis not present

## 2021-06-22 DIAGNOSIS — S83242A Other tear of medial meniscus, current injury, left knee, initial encounter: Secondary | ICD-10-CM | POA: Diagnosis not present

## 2021-06-22 DIAGNOSIS — M1712 Unilateral primary osteoarthritis, left knee: Secondary | ICD-10-CM | POA: Diagnosis not present

## 2021-06-22 DIAGNOSIS — M25462 Effusion, left knee: Secondary | ICD-10-CM | POA: Diagnosis not present

## 2021-06-28 ENCOUNTER — Other Ambulatory Visit: Payer: Self-pay

## 2021-06-28 ENCOUNTER — Telehealth (HOSPITAL_BASED_OUTPATIENT_CLINIC_OR_DEPARTMENT_OTHER): Payer: Medicare HMO | Admitting: Psychiatry

## 2021-06-28 DIAGNOSIS — F332 Major depressive disorder, recurrent severe without psychotic features: Secondary | ICD-10-CM | POA: Diagnosis not present

## 2021-06-28 DIAGNOSIS — F603 Borderline personality disorder: Secondary | ICD-10-CM | POA: Diagnosis not present

## 2021-06-28 DIAGNOSIS — R69 Illness, unspecified: Secondary | ICD-10-CM | POA: Diagnosis not present

## 2021-06-28 DIAGNOSIS — F324 Major depressive disorder, single episode, in partial remission: Secondary | ICD-10-CM

## 2021-06-28 DIAGNOSIS — F411 Generalized anxiety disorder: Secondary | ICD-10-CM

## 2021-06-28 MED ORDER — BUSPIRONE HCL 30 MG PO TABS: ORAL_TABLET | ORAL | 2 refills | Status: DC

## 2021-06-28 MED ORDER — VENLAFAXINE HCL 100 MG PO TABS: 100.0000 mg | ORAL_TABLET | Freq: Three times a day (TID) | ORAL | 2 refills | Status: DC

## 2021-06-28 MED ORDER — HYDROXYZINE HCL 50 MG PO TABS: ORAL_TABLET | ORAL | 1 refills | Status: DC

## 2021-06-28 MED ORDER — LAMOTRIGINE 150 MG PO TABS
150.0000 mg | ORAL_TABLET | Freq: Two times a day (BID) | ORAL | 2 refills | Status: DC
Start: 2021-06-28 — End: 2021-08-22

## 2021-06-28 MED ORDER — DESIPRAMINE HCL 25 MG PO TABS: 25.0000 mg | ORAL_TABLET | Freq: Every day | ORAL | 2 refills | Status: DC

## 2021-06-28 MED ORDER — CLONAZEPAM 0.5 MG PO TABS: ORAL_TABLET | ORAL | 5 refills | Status: DC

## 2021-06-28 NOTE — Progress Notes (Addendum)
`  Psychiatric Initial Adult Assessment   Patient Identification: Jamie Bennett MRN:  [id number] Date of Evaluation:  06/28/2021 Referral Source: grams per previous psychiatrist Chief Complaint:   Visit Diagnosis: borderline personality disorder   ICD-10-CM   1. Anxiety state  F41.1 busPIRone (BUSPAR) 30 MG tablet    clonazePAM (KLONOPIN) 0.5 MG tablet    hydrOXYzine (ATARAX) 50 MG tablet    venlafaxine (EFFEXOR) 100 MG tablet    2. Borderline personality disorder (HCC)  F60.3 venlafaxine (EFFEXOR) 100 MG tablet    lamoTRIgine (LAMICTAL) 150 MG tablet    3. Major depressive disorder, recurrent, severe without psychotic features (HCC)  F33.2 venlafaxine (EFFEXOR) 100 MG tablet    lamoTRIgine (LAMICTAL) 150 MG tablet      History of Present Illness:  Today November 29 the patient is doing fairly well.  [name] died a week ago.  She was fairly close to her and she seems to be handling it pretty well.  Her husband is handling it well to it.  She was cremated and they have chosen not to have a funeral.  The patient stays busy.  She does a lot of sewing.  She watches TV and watches movies.  She likes drawing and coloring.  So the patient stays busy.  She is sleeping well taking trazodone and she is eating well.  She is going to go back to DBT through a workbook.  Approximately 2 months ago the patient however fell and injured her knee.  Last week she had an MRI and results are pending.  The patient takes her medicines as prescribed.  Financially she is stable.  Her husband and her seem to get along pretty well.  She likes where she lives.  The patient has no evidence of psychosis.  She drinks no alcohol and uses no drugs.  Actually she is functioning fairly well.  Over the last few months she has asked for an adjustment in her medicines we talked to her about increasing her Vistaril.  Nonetheless she just takes it 3 times daily with 1 extra now.  We have avoided any benzodiazepines  at this time.   .  Depression Symptoms:  fatigue, (Hypo) Manic Symptoms:   Anxiety Symptoms:   Psychotic Symptoms:   PTSD Symptoms:   Past Psychiatric History: 10 psychiatric hospitalizations multiple psychotropic medications presently in psychotherapy  Previous Psychotropic Medications: Yes   Substance Abuse History in the last 12 months:  Yes.    Consequences of Substance Abuse:   Past Medical History:  Past Medical History:  Diagnosis Date   Anemia    Anxiety    Bipolar 1 disorder (HCC)    Collagen vascular disease (HCC)    rhematoid arthritis   Constipation    Fibromyalgia    GERD (gastroesophageal reflux disease)    Heart murmur    mild-asymptomatic   Hepatic steatosis    History of Roux-en-Y gastric bypass    Hypotension    Hypothyroidism    Opioid abuse (HCC)    Osteoarthritis    Osteoporosis    PONV (postoperative nausea and vomiting)    nausea only   Rheumatic fever    Rheumatoid arthritis (HCC)    Thyroid disease     Past Surgical History:  Procedure Laterality Date   CHOLECYSTECTOMY  2004   COLONOSCOPY N/A 07/16/2017   Procedure: COLONOSCOPY;  Surgeon: Scot Jun, MD;  Location: [facility] ENDOSCOPY;  Service: Endoscopy;  Laterality: N/A;   EXPLORATORY LAPAROTOMY  2009  GASTRIC BYPASS  2003   [facility]   LAPAROTOMY N/A 03/01/2018   Procedure: EXPLORATORY LAPAROTOMY/ UMBILECTOMY;  Surgeon: Earline Mayotte, MD;  Location: [facility] ORS;  Service: General;  Laterality: N/A;   TONSILLECTOMY     VENTRAL HERNIA REPAIR N/A 03/07/2018   Procedure: HERNIA REPAIR VENTRAL ADULT;  Surgeon: Earline Mayotte, MD;  Location: [facility] ORS;  Service: General;  Laterality: N/A;    Family Psychiatric History:   Family History:  Family History  Problem Relation Age of Onset   Depression Mother    Dementia Mother    Heart disease Mother    Osteoporosis Mother    Parkinson's disease Father    Liver disease Brother    Colon cancer Neg Hx     Social  History:   Social History   Socioeconomic History   Marital status: Married    Spouse name: Not on file   Number of children: Not on file   Years of education: Not on file   Highest education level: Not on file  Occupational History   Not on file  Tobacco Use   Smoking status: Never   Smokeless tobacco: Never  Vaping Use   Vaping Use: Never used  Substance and Sexual Activity   Alcohol use: Yes    Comment: occasional   Drug use: No   Sexual activity: Yes  Other Topics Concern   Not on file  Social History Narrative   Not on file   Social Determinants of Health   Financial Resource Strain: Not on file  Food Insecurity: Not on file  Transportation Needs: Not on file  Physical Activity: Not on file  Stress: Not on file  Social Connections: Not on file    Additional Social History:   Allergies:   Allergies  Allergen Reactions   Lactose Intolerance (Gi) Other (See Comments)    Bloating and GI distress   Sulfa Antibiotics Hives    Metabolic Disorder Labs: No results found for: HGBA1C, MPG No results found for: PROLACTIN Lab Results  Component Value Date   CHOL 169 05/26/2020   TRIG 136 05/26/2020   HDL 73 05/26/2020   LDLCALC 73 05/26/2020     Current Medications: Current Outpatient Medications  Medication Sig Dispense Refill   albuterol (VENTOLIN HFA) 108 (90 Base) MCG/ACT inhaler Inhale 2 puffs into the lungs every 6 (six) hours as needed for wheezing or shortness of breath. 8 g 0   amoxicillin-clavulanate (AUGMENTIN) 875-125 MG tablet Take 1 tablet by mouth 2 (two) times daily. Take with food. 20 tablet 0   busPIRone (BUSPAR) 30 MG tablet 1 bid 180 tablet 2   Cholecalciferol 125 MCG (5000 UT) TABS Take 1 tablet by mouth daily.     clonazePAM (KLONOPIN) 0.5 MG tablet TAKE 1 TABLET BY MOUTH ONCE DAILY AS NEEDED FOR  ACUTE  ANXIETY  EMERGENCIES  ONLY  DO  NOT  EXCEED  ONE  PER  DAY 30 tablet 5   Coenzyme Q10 300 MG CAPS Take by mouth.     cyanocobalamin  (,VITAMIN B-12,) 1000 MCG/ML injection Inject 1,000 mcg into the muscle every 30 (thirty) days.      denosumab (PROLIA) 60 MG/ML SOSY injection Inject 60 mg into the skin every 6 (six) months.     desipramine (NORPRAMIN) 25 MG tablet Take 1 tablet (25 mg total) by mouth daily. 90 tablet 2   esomeprazole (NEXIUM) 20 MG capsule Take 20 mg by mouth daily.  gabapentin (NEURONTIN) 100 MG capsule Take 2 capsules (200 mg total) by mouth 3 (three) times daily. 180 capsule 0   gabapentin (NEURONTIN) 100 MG capsule TAKE 2 CAPSULES BY MOUTH THREE TIMES DAILY 180 capsule 1   hydroxychloroquine (PLAQUENIL) 200 MG tablet TAKE ONE TABLET BY MOUTH TWICE A DAY 180 tablet 0   hydrOXYzine (ATARAX) 50 MG tablet 1  Tid   1  Prn q day 360 tablet 1   ketoconazole (NIZORAL) 2 % cream Apply 1 application topically daily. To affected area For 6 weeks or until rash resolves. 30 g 2   Krill Oil 500 MG CAPS Take 500 mg by mouth daily.     lamoTRIgine (LAMICTAL) 150 MG tablet Take 1 tablet (150 mg total) by mouth 2 (two) times daily. 180 tablet 2   levothyroxine (SYNTHROID) 112 MCG tablet TAKE 1 TABLET DAILY 90 tablet 1   loratadine (CLARITIN) 10 MG tablet Take 10 mg by mouth daily as needed for allergies.     magnesium oxide (MAG-OX) 400 MG tablet Take 400 mg by mouth daily.     Multiple Vitamin (MULTIVITAMIN) tablet Take 1 tablet by mouth daily.     nitroGLYCERIN (NITROSTAT) 0.4 MG SL tablet Place 1 tablet (0.4 mg total) under the tongue every 5 (five) minutes as needed for chest pain. 30 tablet 12   polyethylene glycol (MIRALAX) packet Take 17 g by mouth daily. (Patient taking differently: Take 17 g by mouth as needed.) 14 each 0   psyllium (METAMUCIL) 58.6 % packet Take 1 packet by mouth daily.     sucralfate (CARAFATE) 1 g tablet Take 1 tablet (1 g total) by mouth 2 (two) times daily. 180 tablet 0   terbinafine (LAMISIL) 250 MG tablet TAKE 1 TABLET BY MOUTH ONCE DAILY FOR  12  WEEKS  FOR  TOENAIL  FUNGAL  INFECTION 84  tablet 0   tiZANidine (ZANAFLEX) 4 MG tablet Take 1 tablet (4 mg total) by mouth 3 (three) times daily. 90 tablet 1   traZODone (DESYREL) 100 MG tablet Take 1 tablet (100 mg total) by mouth at bedtime as needed and may repeat dose one time if needed for sleep. 60 tablet 5   venlafaxine (EFFEXOR) 100 MG tablet Take 1 tablet (100 mg total) by mouth 3 (three) times daily with meals. 270 tablet 2   vitamin C (ASCORBIC ACID) 500 MG tablet Take 500 mg by mouth daily.     zinc gluconate 50 MG tablet Take 50 mg by mouth daily.     No current facility-administered medications for this visit.    Neurologic: Headache: No Seizure: No Paresthesias:NA  Musculoskeletal: Strength & Muscle Tone: within normal limits Gait & Station: normal Patient leans: N/A  Psychiatric Specialty Exam: ROS  There were no vitals taken for this visit.There is no height or weight on file to calculate BMI.  General Appearance: Bizarre  Eye Contact:  Good  Speech:  Normal Rate  Volume:  Normal  Mood:  Anxious  Affect:  Congruent  Thought Process:  Goal Directed  Orientation:  NA  Thought Content:  Logical  Suicidal Thoughts:  No  Homicidal Thoughts:  No  Memory:  Negative  Judgement:  Fair  Insight:  NA and Good  Psychomotor Activity:  Normal  Concentration:    Recall:  Good  Fund of Knowledge:  Language: Good  Akathisia:  No  Handed:  Right  AIMS (if indicated):    Assets:  Desire for Improvement  ADL's:  Intact  Cognition: WNL  Sleep:     Virtual Visit via Telephone Note  I connected with Anastasia Fiedler on 05/23/23 at  1:30 PM EST by telephone and verified that I am speaking with the correct person using two identifiers.  Location: Patient: home Provider: in office   I discussed the limitations, risks, security and privacy concerns of performing an evaluation and management service by telephone and the availability of in person appointments. I also discussed with the patient that there may  be a patient responsible charge related to this service. The patient expressed understanding and agreed to proceed.     I discussed the assessment and treatment plan with the patient. The patient was provided an opportunity to ask questions and all were answered. The patient agreed with the plan and demonstrated an understanding of the instructions.   The patient was advised to call back or seek an in-person evaluation if the symptoms worsen or if the condition fails to improve as anticipated.  I provided 30 minutes of non-face-to-face time during this encounter.   Gypsy Balsam, MD  Treatment Plan Summary:  This patient seems to have a number of psychiatric problems.  She has had gastric bypass surgery has just to take Effexor 3 times a day.  She takes 100 mg immediate release 3 times a day.  In addition to this she takes desipramine 25 mg.  The patient is next problem is that of bipolar disorder versus borderline personality.  At this time she is doing well taking Lamictal 150 mg 2 a day.  Her third problem is an adjustment disorder with an anxious mood.  The patient is actually doing fairly well taking BuSpar 30 mg twice daily.  Patient also takes Vistaril 25 mg 3 times daily with 1 extra if she needs it.  Her fourth problem is insomnia.  Patient will continue taking trazodone for this condition.  Therefore through this combination of medicines.  The patient is not actually in therapy but she has been through DBT in the past and still has a workbook and she is good at music.  This patient will be reevaluated in 3 months.  This evaluation took place over the phone. 11/29/20222:11 PM

## 2021-07-07 ENCOUNTER — Other Ambulatory Visit: Payer: Self-pay | Admitting: *Deleted

## 2021-07-07 DIAGNOSIS — D509 Iron deficiency anemia, unspecified: Secondary | ICD-10-CM

## 2021-07-11 ENCOUNTER — Other Ambulatory Visit: Payer: Self-pay

## 2021-07-11 ENCOUNTER — Telehealth: Payer: Self-pay

## 2021-07-11 DIAGNOSIS — R300 Dysuria: Secondary | ICD-10-CM

## 2021-07-11 DIAGNOSIS — J029 Acute pharyngitis, unspecified: Secondary | ICD-10-CM

## 2021-07-11 MED ORDER — AMOXICILLIN-POT CLAVULANATE 875-125 MG PO TABS: 1.0000 | ORAL_TABLET | Freq: Two times a day (BID) | ORAL | 0 refills | Status: DC

## 2021-07-11 NOTE — Telephone Encounter (Signed)
Pt called that she had uti she said amoxicillin help advised her to we send this time if she is not feeling better in few days need appt

## 2021-07-12 ENCOUNTER — Encounter: Payer: Self-pay | Admitting: Nurse Practitioner

## 2021-07-12 ENCOUNTER — Other Ambulatory Visit: Payer: Self-pay

## 2021-07-12 ENCOUNTER — Inpatient Hospital Stay (HOSPITAL_BASED_OUTPATIENT_CLINIC_OR_DEPARTMENT_OTHER): Payer: Medicare HMO | Admitting: Nurse Practitioner

## 2021-07-12 ENCOUNTER — Inpatient Hospital Stay: Payer: Medicare HMO

## 2021-07-12 ENCOUNTER — Inpatient Hospital Stay: Payer: Medicare HMO | Attending: Nurse Practitioner

## 2021-07-12 VITALS — BP 137/82 | HR 70 | Temp 97.1°F | Resp 16 | Ht 65.0 in | Wt 356.0 lb

## 2021-07-12 DIAGNOSIS — E538 Deficiency of other specified B group vitamins: Secondary | ICD-10-CM

## 2021-07-12 DIAGNOSIS — E039 Hypothyroidism, unspecified: Secondary | ICD-10-CM | POA: Diagnosis not present

## 2021-07-12 DIAGNOSIS — D509 Iron deficiency anemia, unspecified: Secondary | ICD-10-CM | POA: Diagnosis not present

## 2021-07-12 DIAGNOSIS — Z79899 Other long term (current) drug therapy: Secondary | ICD-10-CM | POA: Insufficient documentation

## 2021-07-12 DIAGNOSIS — Z9884 Bariatric surgery status: Secondary | ICD-10-CM | POA: Insufficient documentation

## 2021-07-12 LAB — IRON AND TIBC
Iron: 81 ug/dL (ref 28–170)
Saturation Ratios: 19 % (ref 10.4–31.8)
TIBC: 420 ug/dL (ref 250–450)
UIBC: 339 ug/dL

## 2021-07-12 LAB — CBC WITH DIFFERENTIAL/PLATELET
Abs Immature Granulocytes: 0.03 10*3/uL (ref 0.00–0.07)
Basophils Absolute: 0 10*3/uL (ref 0.0–0.1)
Basophils Relative: 1 %
Eosinophils Absolute: 0.2 10*3/uL (ref 0.0–0.5)
Eosinophils Relative: 5 %
HCT: 38.6 % (ref 36.0–46.0)
Hemoglobin: 12.2 g/dL (ref 12.0–15.0)
Immature Granulocytes: 1 %
Lymphocytes Relative: 30 %
Lymphs Abs: 1.4 10*3/uL (ref 0.7–4.0)
MCH: 29.3 pg (ref 26.0–34.0)
MCHC: 31.6 g/dL (ref 30.0–36.0)
MCV: 92.8 fL (ref 80.0–100.0)
Monocytes Absolute: 0.4 10*3/uL (ref 0.1–1.0)
Monocytes Relative: 9 %
Neutro Abs: 2.5 10*3/uL (ref 1.7–7.7)
Neutrophils Relative %: 54 %
Platelets: 218 10*3/uL (ref 150–400)
RBC: 4.16 MIL/uL (ref 3.87–5.11)
RDW: 13.3 % (ref 11.5–15.5)
WBC: 4.5 10*3/uL (ref 4.0–10.5)
nRBC: 0 % (ref 0.0–0.2)

## 2021-07-12 LAB — FERRITIN: Ferritin: 41 ng/mL (ref 11–307)

## 2021-07-12 MED ORDER — CYANOCOBALAMIN 1000 MCG/ML IJ SOLN
1000.0000 ug | Freq: Once | INTRAMUSCULAR | Status: AC
  Administered 2021-07-12: 1000 ug via INTRAMUSCULAR
  Filled 2021-07-12: qty 1

## 2021-07-12 NOTE — Progress Notes (Signed)
Hematology/Oncology Progress Note [facility]  [phone] [phone]  Patient Care Team: Jamie Guise, MD as PCP - General (Internal Medicine)   Name of the patient: Jamie Bennett  [id number]  07-23-1961   Date of visit: 07/12/21  Diagnosis-iron and B12 deficiency anemia  Chief complaint/ Reason for visit-routine follow-up of anemia  Heme/Onc history: [name] with history of gastric bypass surgery in 2003.  She is subsequent developed iron and B12 deficiency and has been getting IV iron from time to time.  She was previously seen Dr. Mike Bennett and has transferred care to Dr. Janese Bennett  Interval history-patient is 60 year old female with above history of gastric bypass and history of iron and B12 anemia.  She returns to clinic for labs, further evaluation, and consideration of B12.  She feels well and denies specific complaints.  Is somewhat fatigued recently with family members who have been ill.  Denies black or bloody stools.  No leg cramps, shortness of breath, pica.  ECOG PS- 1 Pain scale- 0  Review of systems- Review of Systems  Constitutional:  Positive for malaise/fatigue. Negative for chills, fever and weight loss.  HENT:  Negative for congestion, ear discharge and nosebleeds.   Eyes:  Negative for blurred vision.  Respiratory:  Negative for cough, hemoptysis, sputum production, shortness of breath and wheezing.   Cardiovascular:  Negative for chest pain, palpitations, orthopnea and claudication.  Gastrointestinal:  Negative for abdominal pain, blood in stool, constipation, diarrhea, heartburn, melena, nausea and vomiting.  Genitourinary:  Negative for dysuria, flank pain, frequency, hematuria and urgency.  Musculoskeletal:  Negative for back pain, joint pain and myalgias.  Skin:  Negative for rash.  Neurological:  Negative for dizziness, tingling, focal weakness, seizures, weakness and headaches.   Endo/Heme/Allergies:  Does not bruise/bleed easily.  Psychiatric/Behavioral:  Negative for depression and suicidal ideas. The patient does not have insomnia.      Allergies  Allergen Reactions   Lactose Intolerance (Gi) Other (See Comments)    Bloating and GI distress   Sulfa Antibiotics Hives    Past Medical History:  Diagnosis Date   Anemia    Anxiety    Bipolar 1 disorder (HCC)    Collagen vascular disease (HCC)    rhematoid arthritis   Constipation    Fibromyalgia    GERD (gastroesophageal reflux disease)    Heart murmur    mild-asymptomatic   Hepatic steatosis    History of Roux-en-Y gastric bypass    Hypotension    Hypothyroidism    Opioid abuse ([place])    Osteoarthritis    Osteoporosis    PONV (postoperative nausea and vomiting)    nausea only   Rheumatic fever    Rheumatoid arthritis ([place])    Thyroid disease     Past Surgical History:  Procedure Laterality Date   CHOLECYSTECTOMY  2004   COLONOSCOPY N/A 07/16/2017   Procedure: COLONOSCOPY;  Surgeon: Jamie Silvas, MD;  Location: [facility] ENDOSCOPY;  Service: Endoscopy;  Laterality: N/A;   EXPLORATORY LAPAROTOMY  2009   GASTRIC BYPASS  2003   [place] N/A 03/01/2018   Procedure: EXPLORATORY LAPAROTOMY/ UMBILECTOMY;  Surgeon: Jamie Bellow, MD;  Location: [facility] ORS;  Service: General;  Laterality: N/A;   TONSILLECTOMY     VENTRAL HERNIA REPAIR N/A 03/07/2018   Procedure: HERNIA REPAIR VENTRAL ADULT;  Surgeon: Jamie Bellow, MD;  Location: [facility] ORS;  Service: General;  Laterality: N/A;    Social  History   Socioeconomic History   Marital status: Married    Spouse name: Not on file   Number of children: Not on file   Years of education: Not on file   Highest education level: Not on file  Occupational History   Not on file  Tobacco Use   Smoking status: Never   Smokeless tobacco: Never  Vaping Use   Vaping Use: Never used  Substance and Sexual Activity   Alcohol use: Yes     Comment: occasional   Drug use: No   Sexual activity: Yes  Other Topics Concern   Not on file  Social History Narrative   Not on file   Social Determinants of Health   Financial Resource Strain: Not on file  Food Insecurity: Not on file  Transportation Needs: Not on file  Physical Activity: Not on file  Stress: Not on file  Social Connections: Not on file  Intimate Partner Violence: Not on file    Family History  Problem Relation Age of Onset   Depression Mother    Dementia Mother    Heart disease Mother    Osteoporosis Mother    Parkinson's disease Father    Liver disease Brother    Colon cancer Neg Hx      Current Outpatient Medications:    albuterol (VENTOLIN HFA) 108 (90 Base) MCG/ACT inhaler, Inhale 2 puffs into the lungs every 6 (six) hours as needed for wheezing or shortness of breath., Disp: 8 g, Rfl: 0   amoxicillin-clavulanate (AUGMENTIN) 875-125 MG tablet, Take 1 tablet by mouth 2 (two) times daily. Take with food., Disp: 20 tablet, Rfl: 0   busPIRone (BUSPAR) 30 MG tablet, 1 bid, Disp: 180 tablet, Rfl: 2   Cholecalciferol 125 MCG (5000 UT) TABS, Take 1 tablet by mouth daily., Disp: , Rfl:    clonazePAM (KLONOPIN) 0.5 MG tablet, TAKE 1 TABLET BY MOUTH ONCE DAILY AS NEEDED FOR  ACUTE  ANXIETY  EMERGENCIES  ONLY  DO  NOT  EXCEED  ONE  PER  DAY, Disp: 30 tablet, Rfl: 5   Coenzyme Q10 300 MG CAPS, Take by mouth., Disp: , Rfl:    cyanocobalamin (,VITAMIN B-12,) 1000 MCG/ML injection, Inject 1,000 mcg into the muscle every 30 (thirty) days. , Disp: , Rfl:    denosumab (PROLIA) 60 MG/ML SOSY injection, Inject 60 mg into the skin every 6 (six) months., Disp: , Rfl:    desipramine (NORPRAMIN) 25 MG tablet, Take 1 tablet (25 mg total) by mouth daily., Disp: 90 tablet, Rfl: 2   esomeprazole (NEXIUM) 20 MG capsule, Take 20 mg by mouth daily. , Disp: , Rfl:    gabapentin (NEURONTIN) 100 MG capsule, TAKE 2 CAPSULES BY MOUTH THREE TIMES DAILY, Disp: 180 capsule, Rfl: 1    hydroxychloroquine (PLAQUENIL) 200 MG tablet, TAKE ONE TABLET BY MOUTH TWICE A DAY, Disp: 180 tablet, Rfl: 0   hydrOXYzine (ATARAX) 50 MG tablet, 1  Tid   1  Prn q day, Disp: 360 tablet, Rfl: 1   ketoconazole (NIZORAL) 2 % cream, Apply 1 application topically daily. To affected area For 6 weeks or until rash resolves., Disp: 30 g, Rfl: 2   Krill Oil 500 MG CAPS, Take 500 mg by mouth daily., Disp: , Rfl:    lamoTRIgine (LAMICTAL) 150 MG tablet, Take 1 tablet (150 mg total) by mouth 2 (two) times daily., Disp: 180 tablet, Rfl: 2   levothyroxine (SYNTHROID) 112 MCG tablet, TAKE 1 TABLET DAILY, Disp: 90 tablet, Rfl:  1   loratadine (CLARITIN) 10 MG tablet, Take 10 mg by mouth daily as needed for allergies., Disp: , Rfl:    magnesium oxide (MAG-OX) 400 MG tablet, Take 400 mg by mouth daily., Disp: , Rfl:    Multiple Vitamin (MULTIVITAMIN) tablet, Take 1 tablet by mouth daily., Disp: , Rfl:    nitroGLYCERIN (NITROSTAT) 0.4 MG SL tablet, Place 1 tablet (0.4 mg total) under the tongue every 5 (five) minutes as needed for chest pain., Disp: 30 tablet, Rfl: 12   polyethylene glycol (MIRALAX) packet, Take 17 g by mouth daily. (Patient taking differently: Take 17 g by mouth as needed.), Disp: 14 each, Rfl: 0   psyllium (METAMUCIL) 58.6 % packet, Take 1 packet by mouth daily., Disp: , Rfl:    sucralfate (CARAFATE) 1 g tablet, Take 1 tablet (1 g total) by mouth 2 (two) times daily., Disp: 180 tablet, Rfl: 0   terbinafine (LAMISIL) 250 MG tablet, TAKE 1 TABLET BY MOUTH ONCE DAILY FOR  12  WEEKS  FOR  TOENAIL  FUNGAL  INFECTION, Disp: 84 tablet, Rfl: 0   tiZANidine (ZANAFLEX) 4 MG tablet, Take 1 tablet (4 mg total) by mouth 3 (three) times daily., Disp: 90 tablet, Rfl: 1   traZODone (DESYREL) 100 MG tablet, Take 1 tablet (100 mg total) by mouth at bedtime as needed and may repeat dose one time if needed for sleep., Disp: 60 tablet, Rfl: 5   venlafaxine (EFFEXOR) 100 MG tablet, Take 1 tablet (100 mg total) by mouth 3  (three) times daily with meals., Disp: 270 tablet, Rfl: 2   vitamin C (ASCORBIC ACID) 500 MG tablet, Take 500 mg by mouth daily., Disp: , Rfl:    zinc gluconate 50 MG tablet, Take 50 mg by mouth daily., Disp: , Rfl:    gabapentin (NEURONTIN) 100 MG capsule, Take 2 capsules (200 mg total) by mouth 3 (three) times daily., Disp: 180 capsule, Rfl: 0  Physical exam:  Vitals:   07/12/21 1444  BP: 137/82  Pulse: 70  Resp: 16  Temp: (!) 97.1 F (36.2 C)  TempSrc: Tympanic  Weight: (!) 356 lb (161.5 kg)  Height: 5\' 5"  (1.651 m)   Physical Exam Constitutional:      General: She is not in acute distress.    Appearance: She is well-developed. She is not ill-appearing.  HENT:     Head: Atraumatic.     Nose: Nose normal.     Mouth/Throat:     Pharynx: No oropharyngeal exudate.  Eyes:     General: No scleral icterus.    Conjunctiva/sclera: Conjunctivae normal.  Cardiovascular:     Rate and Rhythm: Normal rate and regular rhythm.  Pulmonary:     Effort: Pulmonary effort is normal.     Breath sounds: Normal breath sounds.  Abdominal:     General: Bowel sounds are normal. There is no distension.     Palpations: Abdomen is soft.     Tenderness: There is no abdominal tenderness.  Musculoskeletal:        General: No tenderness or deformity. Normal range of motion.     Cervical back: Normal range of motion and neck supple.  Skin:    General: Skin is warm and dry.     Coloration: Skin is not pale.  Neurological:     General: No focal deficit present.     Mental Status: She is alert and oriented to person, place, and time.  Psychiatric:        Mood  and Affect: Mood normal.        Behavior: Behavior normal.     CMP Latest Ref Rng & Units 04/12/2021  Glucose 70 - 99 mg/dL 93  BUN 6 - 20 mg/dL 19  Creatinine 0.44 - 1.00 mg/dL 0.88  Sodium 135 - 145 mmol/L 139  Potassium 3.5 - 5.1 mmol/L 3.8  Chloride 98 - 111 mmol/L 108  CO2 22 - 32 mmol/L 25  Calcium 8.9 - 10.3 mg/dL 9.1  Total  Protein 6.5 - 8.1 g/dL 7.5  Total Bilirubin 0.3 - 1.2 mg/dL 0.6  Alkaline Phos 38 - 126 U/L 94  AST 15 - 41 U/L 22  ALT 0 - 44 U/L 16   CBC Latest Ref Rng & Units 07/12/2021  WBC 4.0 - 10.5 K/uL 4.5  Hemoglobin 12.0 - 15.0 g/dL 12.2  Hematocrit 36.0 - 46.0 % 38.6  Platelets 150 - 400 K/uL 218    No images are attached to the encounter.  MR KNEE LEFT WO CONTRAST  Result Date: 06/25/2021 CLINICAL DATA:  Anterior knee pain and swelling since falling 3 months ago. Patient felt a pop walking 4 weeks later with increasing pain. No previous relevant surgery. EXAM: MRI OF THE LEFT KNEE WITHOUT CONTRAST TECHNIQUE: Multiplanar, multisequence MR imaging of the knee was performed. No intravenous contrast was administered. COMPARISON:  Radiographs 04/04/2021 FINDINGS: Intra-articular detail is mildly limited by body habitus and mild motion artifact. MENISCI Medial meniscus: Degenerative tearing of the meniscal body, best seen on the coronal images. The meniscal root is intact. No centrally displaced meniscal fragment. Lateral meniscus:  Intact with normal morphology. LIGAMENTS Cruciates:  Intact. Collaterals:  Intact.  Mild MCL degeneration and medial buckling. CARTILAGE Patellofemoral: Mild patellar chondral thinning and subchondral cyst formation superiorly at apex. The trochlear cartilage appears preserved. Medial: Moderate chondral thinning, surface irregularity and osteophyte formation. Lateral: Mild chondral thinning and osteophyte formation. There is subchondral cyst formation centrally in the lateral femoral condyle. MISCELLANEOUS Joint: Moderate-sized joint effusion with mild synovial irregularity. Popliteal Fossa: The popliteus muscle and tendon are intact. No significant Baker's cyst. Extensor Mechanism:  Intact. Bones: Patchy marrow T2 hyperintensity within the medial tibial metaphysis without extension to the articular surface or definite corresponding abnormality on previous radiographs. There is  corresponding decreased T1 marrow signal, and this could reflect a stress fracture. Other: Generalized subcutaneous edema surrounding the knee without focal fluid collection. IMPRESSION: 1. Irregular degenerative tear of the body of the medial meniscus. 2. The lateral meniscus, cruciate and collateral ligaments are intact. 3. Possible stress fracture of the medial tibial metaphysis. 4. Tricompartmental degenerative changes, greatest in the medial compartment. Electronically Signed   By: Richardean Sale M.D.   On: 06/25/2021 11:17      Assessment and plan- Patient is a 60 y.o. female B12 deficiency anemia secondary to gastric bypass who returns to routine follow-up.   Hemoglobin was 12 point stable. Stable. Last feraheme was May 2020.  Iron studies are pending at time of visit but are overall stable.  Her ferritin is 41, saturation ratio is 19%, TIBC is not elevated.  Continue to monitor.  B12 deficiency-continue receiving monthly B12 injections.  Malabsorption secondary to gastric bypass.  Check B12 at next visit.  Plan: Monthly b12 3 mo- lab (cbc, ferritin, iron studies, b12, folate) 6 mo- lab (cbc, ferritin, iron studies, b12, folate), MD- la   Visit Diagnosis 1. Iron deficiency anemia, unspecified iron deficiency anemia type   2. B12 deficiency   3. History of  gastric bypass    Beckey Rutter, DNP, AGNP-C [place] [facility] [phone] (clinic) 07/12/2021

## 2021-07-12 NOTE — Progress Notes (Signed)
Pt tired but not any different from always.

## 2021-07-13 DIAGNOSIS — M1712 Unilateral primary osteoarthritis, left knee: Secondary | ICD-10-CM | POA: Diagnosis not present

## 2021-07-13 DIAGNOSIS — M84469A Pathological fracture, unspecified tibia and fibula, initial encounter for fracture: Secondary | ICD-10-CM | POA: Diagnosis not present

## 2021-08-01 DIAGNOSIS — N3001 Acute cystitis with hematuria: Secondary | ICD-10-CM | POA: Diagnosis not present

## 2021-08-01 DIAGNOSIS — R300 Dysuria: Secondary | ICD-10-CM | POA: Diagnosis not present

## 2021-08-02 DIAGNOSIS — M810 Age-related osteoporosis without current pathological fracture: Secondary | ICD-10-CM | POA: Diagnosis not present

## 2021-08-07 ENCOUNTER — Other Ambulatory Visit: Payer: Self-pay | Admitting: Physician Assistant

## 2021-08-07 DIAGNOSIS — M0579 Rheumatoid arthritis with rheumatoid factor of multiple sites without organ or systems involvement: Secondary | ICD-10-CM

## 2021-08-08 NOTE — Telephone Encounter (Signed)
Next Visit: 10/20/2021  Last Visit: 05/24/2021  Labs: April 12, 2021 CBC with differential and CMP with GFR were reviewed which were within normal limits.   Eye exam: 03/24/2021 WNL    Current Dose per office note 05/24/2021: Plaquenil 200 mg 1 tablet by mouth twice daily.  ZW:CHENIDPOEU arthritis involving multiple sites with positive rheumatoid factor   Last Fill: 05/09/2021  Okay to refill Plaquenil?

## 2021-08-10 ENCOUNTER — Telehealth: Payer: Self-pay | Admitting: Oncology

## 2021-08-10 NOTE — Telephone Encounter (Signed)
Pt called to reschedule her appt for 1-13. Call back at [phone]

## 2021-08-10 NOTE — Telephone Encounter (Signed)
Got her rescheduled, thanks!  FYI: If it is just a rescheduling request-you can just send to me.  Thanks! Anderson Malta

## 2021-08-12 ENCOUNTER — Inpatient Hospital Stay: Payer: Medicare HMO

## 2021-08-15 ENCOUNTER — Other Ambulatory Visit: Payer: Self-pay | Admitting: Nurse Practitioner

## 2021-08-18 ENCOUNTER — Inpatient Hospital Stay: Payer: Medicare HMO | Attending: Oncology

## 2021-08-18 ENCOUNTER — Other Ambulatory Visit: Payer: Self-pay

## 2021-08-18 ENCOUNTER — Ambulatory Visit (INDEPENDENT_AMBULATORY_CARE_PROVIDER_SITE_OTHER): Payer: Medicare HMO | Admitting: Physician Assistant

## 2021-08-18 DIAGNOSIS — R300 Dysuria: Secondary | ICD-10-CM

## 2021-08-18 DIAGNOSIS — E538 Deficiency of other specified B group vitamins: Secondary | ICD-10-CM | POA: Diagnosis not present

## 2021-08-18 DIAGNOSIS — N390 Urinary tract infection, site not specified: Secondary | ICD-10-CM | POA: Diagnosis not present

## 2021-08-18 DIAGNOSIS — R21 Rash and other nonspecific skin eruption: Secondary | ICD-10-CM | POA: Diagnosis not present

## 2021-08-18 MED ORDER — CYANOCOBALAMIN 1000 MCG/ML IJ SOLN
1000.0000 ug | Freq: Once | INTRAMUSCULAR | Status: AC
  Administered 2021-08-18: 1000 ug via INTRAMUSCULAR
  Filled 2021-08-18: qty 1

## 2021-08-18 MED ORDER — ZINC OXIDE 13 % EX CREA: TOPICAL_OINTMENT | Freq: Two times a day (BID) | CUTANEOUS | 0 refills | Status: DC | PRN

## 2021-08-18 MED ORDER — NITROFURANTOIN MONOHYD MACRO 100 MG PO CAPS: ORAL_CAPSULE | ORAL | 0 refills | Status: DC

## 2021-08-18 NOTE — Progress Notes (Signed)
[facility] [place], [place] [address]  Internal MEDICINE  Office Visit Note  Patient Name: Jamie Bennett  [id number]  [id number]  Date of Service: 08/24/2021  Chief Complaint  Patient presents with   Urinary Tract Infection    Has been having burning when urinating and after - took AZO at 3am - has some aching and pressure in pelvis - had a UTI on 08/05/2021, was prescribed Cefdinir 300mg  but UTI is now occurring again     HPI Pt is here for a sick visit. -Went to [facility] on 1/2 and started augmentin but then culture came back resistant and was switched to cefdinir until the 13th. -Did better until yesterday. Starting having burning and pressure before and during urination and has some lingering pain after.  -She took AZO this morning without improvement, therefore urine will be sent out -She has had multiple instances of UTIs now and will need to see urology -Also has a rash under her lip that has been present for awhile. She has been using abreva but it hasnt helped and denies any history of fever blisters. Discussed trying ointment to help this resolved but did explain the need to be very careful with use on face due to possible skin thinning and discoloration if any steroid used. Will try sending multiple purpose ointment, however if not covered will try mupirocin with possible cortisone cream only if needed  Current Medication:  Outpatient Encounter Medications as of 08/18/2021  Medication Sig   albuterol (VENTOLIN HFA) 108 (90 Base) MCG/ACT inhaler Inhale 2 puffs into the lungs every 6 (six) hours as needed for wheezing or shortness of breath.   amoxicillin-clavulanate (AUGMENTIN) 875-125 MG tablet Take 1 tablet by mouth 2 (two) times daily. Take with food.   busPIRone (BUSPAR) 30 MG tablet 1 bid   Cholecalciferol 125 MCG (5000 UT) TABS Take 1 tablet by mouth daily.   clonazePAM (KLONOPIN) 0.5 MG tablet TAKE 1 TABLET BY MOUTH ONCE DAILY AS NEEDED  FOR  ACUTE  ANXIETY  EMERGENCIES  ONLY  DO  NOT  EXCEED  ONE  PER  DAY   Coenzyme Q10 300 MG CAPS Take by mouth.   cyanocobalamin (,VITAMIN B-12,) 1000 MCG/ML injection Inject 1,000 mcg into the muscle every 30 (thirty) days.    denosumab (PROLIA) 60 MG/ML SOSY injection Inject 60 mg into the skin every 6 (six) months.   desipramine (NORPRAMIN) 25 MG tablet Take 1 tablet (25 mg total) by mouth daily.   esomeprazole (NEXIUM) 20 MG capsule Take 20 mg by mouth daily.    gabapentin (NEURONTIN) 100 MG capsule TAKE 2 CAPSULES BY MOUTH THREE TIMES DAILY   hydroxychloroquine (PLAQUENIL) 200 MG tablet TAKE ONE TABLET BY MOUTH TWICE A DAY   hydrOXYzine (ATARAX) 50 MG tablet 1  Tid   1  Prn q day   ketoconazole (NIZORAL) 2 % cream Apply 1 application topically daily. To affected area For 6 weeks or until rash resolves.   Krill Oil 500 MG CAPS Take 500 mg by mouth daily.   levothyroxine (SYNTHROID) 112 MCG tablet TAKE 1 TABLET DAILY   loratadine (CLARITIN) 10 MG tablet Take 10 mg by mouth daily as needed for allergies.   magnesium oxide (MAG-OX) 400 MG tablet Take 400 mg by mouth daily.   Multiple Vitamin (MULTIVITAMIN) tablet Take 1 tablet by mouth daily.   nitrofurantoin, macrocrystal-monohydrate, (MACROBID) 100 MG capsule Take 1 cap twice per day for 10 days.   nitroGLYCERIN (  NITROSTAT) 0.4 MG SL tablet Place 1 tablet (0.4 mg total) under the tongue every 5 (five) minutes as needed for chest pain.   polyethylene glycol (MIRALAX) packet Take 17 g by mouth daily. (Patient taking differently: Take 17 g by mouth as needed.)   psyllium (METAMUCIL) 58.6 % packet Take 1 packet by mouth daily.   sucralfate (CARAFATE) 1 g tablet Take 1 tablet (1 g total) by mouth 2 (two) times daily.   terbinafine (LAMISIL) 250 MG tablet TAKE 1 TABLET BY MOUTH ONCE DAILY FOR  12  WEEKS  FOR  TOENAIL  FUNGAL  INFECTION   traZODone (DESYREL) 100 MG tablet Take 1 tablet (100 mg total) by mouth at bedtime as needed and may repeat  dose one time if needed for sleep.   venlafaxine (EFFEXOR) 100 MG tablet Take 1 tablet (100 mg total) by mouth 3 (three) times daily with meals.   vitamin C (ASCORBIC ACID) 500 MG tablet Take 500 mg by mouth daily.   zinc gluconate 50 MG tablet Take 50 mg by mouth daily.   [DISCONTINUED] lamoTRIgine (LAMICTAL) 150 MG tablet Take 1 tablet (150 mg total) by mouth 2 (two) times daily.   [DISCONTINUED] mupirocin 2% oint-hydrocortisone 2.5% cream-nystatin cream-zinc oxide 13% oint 1:1:1:5 mixture Apply topically 2 (two) times daily as needed.   [DISCONTINUED] tiZANidine (ZANAFLEX) 4 MG tablet Take 1 tablet (4 mg total) by mouth 3 (three) times daily.   gabapentin (NEURONTIN) 100 MG capsule Take 2 capsules (200 mg total) by mouth 3 (three) times daily.   No facility-administered encounter medications on file as of 08/18/2021.      Medical History: Past Medical History:  Diagnosis Date   Anemia    Anxiety    Bipolar 1 disorder ([place])    Collagen vascular disease ([place])    rhematoid arthritis   Constipation    Fibromyalgia    GERD (gastroesophageal reflux disease)    Heart murmur    mild-asymptomatic   Hepatic steatosis    History of Roux-en-Y gastric bypass    Hypotension    Hypothyroidism    Opioid abuse (HCC)    Osteoarthritis    Osteoporosis    PONV (postoperative nausea and vomiting)    nausea only   Rheumatic fever    Rheumatoid arthritis (HCC)    Thyroid disease      Vital Signs: BP (!) 142/70    Pulse 78    Temp 97.7 F (36.5 C)    Resp 16    Ht 5\' 5"  (1.651 m)    Wt (!) 348 lb 3.2 oz (157.9 kg)    SpO2 98%    BMI 57.94 kg/m    Review of Systems  Constitutional:  Negative for fatigue and fever.  HENT:  Negative for congestion, mouth sores and postnasal drip.   Respiratory:  Negative for cough.   Cardiovascular:  Negative for chest pain.  Genitourinary:  Positive for dysuria, pelvic pain and urgency. Negative for flank pain.  Skin:  Positive for rash.   Psychiatric/Behavioral: Negative.     Physical Exam Vitals and nursing note reviewed.  Constitutional:      General: She is not in acute distress.    Appearance: She is well-developed. She is obese. She is not diaphoretic.  HENT:     Head: Normocephalic and atraumatic.     Mouth/Throat:     Pharynx: No oropharyngeal exudate.  Eyes:     Pupils: Pupils are equal, round, and reactive to light.  Neck:  Thyroid: No thyromegaly.     Vascular: No JVD.     Trachea: No tracheal deviation.  Cardiovascular:     Rate and Rhythm: Normal rate and regular rhythm.     Heart sounds: Normal heart sounds. No murmur heard.   No friction rub. No gallop.  Pulmonary:     Effort: Pulmonary effort is normal. No respiratory distress.     Breath sounds: No wheezing or rales.  Chest:     Chest wall: No tenderness.  Abdominal:     General: Bowel sounds are normal.     Palpations: Abdomen is soft.  Musculoskeletal:        General: Normal range of motion.     Cervical back: Normal range of motion and neck supple.  Lymphadenopathy:     Cervical: No cervical adenopathy.  Skin:    General: Skin is warm and dry.     Findings: Rash present.     Comments: Small Rash under bottom lip  Neurological:     Mental Status: She is alert and oriented to person, place, and time.     Cranial Nerves: No cranial nerve deficit.  Psychiatric:        Behavior: Behavior normal.        Thought Content: Thought content normal.        Judgment: Judgment normal.      Assessment/Plan: 1. Dysuria Will start on macrobid base don symptoms will adjust based on C/S. Will also refer to urology due to recurring UTIs - UA/M w/rflx Culture, Routine - nitrofurantoin, macrocrystal-monohydrate, (MACROBID) 100 MG capsule; Take 1 cap twice per day for 10 days.  Dispense: 20 capsule; Refill: 0  2. Recurrent UTI Will refer due to recurring UTIs - Ambulatory referral to Urology  3. Rash May try mupirocin ointment, if not  improving will call office. Advised she may use a little steroid cream if needed, but to use sparingly as it can cause skin thinning or discoloration and to call office if not improving.   General Counseling: shigeko manard understanding of the findings of todays visit and agrees with plan of treatment. I have discussed any further diagnostic evaluation that may be needed or ordered today. We also reviewed her medications today. she has been encouraged to call the office with any questions or concerns that should arise related to todays visit.    Counseling:    Orders Placed This Encounter  Procedures   Microscopic Examination   Urine Culture, Reflex   UA/M w/rflx Culture, Routine   Ambulatory referral to Urology    Meds ordered this encounter  Medications   nitrofurantoin, macrocrystal-monohydrate, (MACROBID) 100 MG capsule    Sig: Take 1 cap twice per day for 10 days.    Dispense:  20 capsule    Refill:  0   DISCONTD: mupirocin 2% oint-hydrocortisone 2.5% cream-nystatin cream-zinc oxide 13% oint 1:1:1:5 mixture    Sig: Apply topically 2 (two) times daily as needed.    Dispense:  120 g    Refill:  0    Time spent:30 Minutes

## 2021-08-20 ENCOUNTER — Other Ambulatory Visit (HOSPITAL_COMMUNITY): Payer: Self-pay | Admitting: Psychiatry

## 2021-08-20 ENCOUNTER — Other Ambulatory Visit: Payer: Self-pay | Admitting: Nurse Practitioner

## 2021-08-20 DIAGNOSIS — F603 Borderline personality disorder: Secondary | ICD-10-CM

## 2021-08-20 DIAGNOSIS — M542 Cervicalgia: Secondary | ICD-10-CM

## 2021-08-20 DIAGNOSIS — F332 Major depressive disorder, recurrent severe without psychotic features: Secondary | ICD-10-CM

## 2021-08-20 DIAGNOSIS — G6189 Other inflammatory polyneuropathies: Secondary | ICD-10-CM

## 2021-08-22 ENCOUNTER — Other Ambulatory Visit: Payer: Self-pay | Admitting: Physician Assistant

## 2021-08-22 ENCOUNTER — Other Ambulatory Visit (HOSPITAL_COMMUNITY): Payer: Self-pay | Admitting: *Deleted

## 2021-08-22 ENCOUNTER — Telehealth: Payer: Self-pay

## 2021-08-22 ENCOUNTER — Other Ambulatory Visit: Payer: Self-pay

## 2021-08-22 DIAGNOSIS — F603 Borderline personality disorder: Secondary | ICD-10-CM

## 2021-08-22 DIAGNOSIS — R21 Rash and other nonspecific skin eruption: Secondary | ICD-10-CM

## 2021-08-22 DIAGNOSIS — F332 Major depressive disorder, recurrent severe without psychotic features: Secondary | ICD-10-CM

## 2021-08-22 DIAGNOSIS — M84469G Pathological fracture, unspecified tibia and fibula, subsequent encounter for fracture with delayed healing: Secondary | ICD-10-CM | POA: Diagnosis not present

## 2021-08-22 DIAGNOSIS — M1712 Unilateral primary osteoarthritis, left knee: Secondary | ICD-10-CM | POA: Diagnosis not present

## 2021-08-22 MED ORDER — MUPIROCIN 2 % EX OINT: 1.0000 "application " | TOPICAL_OINTMENT | Freq: Two times a day (BID) | CUTANEOUS | 0 refills | Status: DC

## 2021-08-22 MED ORDER — ZINC OXIDE 13 % EX CREA: TOPICAL_OINTMENT | Freq: Two times a day (BID) | CUTANEOUS | 0 refills | Status: DC | PRN

## 2021-08-22 MED ORDER — LAMOTRIGINE 150 MG PO TABS: 150.0000 mg | ORAL_TABLET | Freq: Two times a day (BID) | ORAL | 0 refills | Status: DC

## 2021-08-22 NOTE — Telephone Encounter (Signed)
Spoke to pt, informed her med was sent to pharmacy

## 2021-08-23 ENCOUNTER — Telehealth: Payer: Self-pay

## 2021-08-23 LAB — URINE CULTURE, REFLEX

## 2021-08-23 LAB — UA/M W/RFLX CULTURE, ROUTINE
Bilirubin, UA: NEGATIVE
Glucose, UA: NEGATIVE
Ketones, UA: NEGATIVE
Nitrite, UA: POSITIVE — AB
Specific Gravity, UA: 1.028 (ref 1.005–1.030)
Urobilinogen, Ur: 1 mg/dL (ref 0.2–1.0)
pH, UA: 5 (ref 5.0–7.5)

## 2021-08-23 LAB — MICROSCOPIC EXAMINATION
Casts: NONE SEEN /lpf
WBC, UA: >30 /hpf — AB (ref 0–5)

## 2021-08-23 NOTE — Telephone Encounter (Signed)
Pt already taking nitrofurantoin 100 mg for UTI sent on 08/18/2021

## 2021-08-23 NOTE — Telephone Encounter (Signed)
-----   Message from Lavera Guise, MD sent at 08/23/2021 11:35 AM EST ----- See

## 2021-08-29 ENCOUNTER — Other Ambulatory Visit: Payer: Self-pay | Admitting: Orthopedic Surgery

## 2021-09-01 ENCOUNTER — Encounter: Payer: Self-pay | Admitting: Nurse Practitioner

## 2021-09-01 ENCOUNTER — Other Ambulatory Visit: Payer: Self-pay

## 2021-09-01 ENCOUNTER — Ambulatory Visit (INDEPENDENT_AMBULATORY_CARE_PROVIDER_SITE_OTHER): Payer: Medicare HMO | Admitting: Nurse Practitioner

## 2021-09-01 VITALS — BP 120/80 | HR 75 | Temp 98.2°F | Resp 16 | Ht 65.0 in | Wt 348.0 lb

## 2021-09-01 DIAGNOSIS — E039 Hypothyroidism, unspecified: Secondary | ICD-10-CM

## 2021-09-01 DIAGNOSIS — Z87440 Personal history of urinary (tract) infections: Secondary | ICD-10-CM | POA: Diagnosis not present

## 2021-09-01 DIAGNOSIS — R81 Glycosuria: Secondary | ICD-10-CM | POA: Diagnosis not present

## 2021-09-01 DIAGNOSIS — I700 Atherosclerosis of aorta: Secondary | ICD-10-CM | POA: Diagnosis not present

## 2021-09-01 DIAGNOSIS — R300 Dysuria: Secondary | ICD-10-CM | POA: Diagnosis not present

## 2021-09-01 LAB — POCT URINALYSIS DIPSTICK
Blood, UA: NEGATIVE
Glucose, UA: POSITIVE — AB
Leukocytes, UA: NEGATIVE
Nitrite, UA: NEGATIVE
Protein, UA: POSITIVE — AB
Spec Grav, UA: 1.025 (ref 1.010–1.025)
Urobilinogen, UA: NEGATIVE E.U./dL — AB
pH, UA: 5 (ref 5.0–8.0)

## 2021-09-01 LAB — POCT GLYCOSYLATED HEMOGLOBIN (HGB A1C): Hemoglobin A1C: 5 % (ref 4.0–5.6)

## 2021-09-01 MED ORDER — LEVOTHYROXINE SODIUM 112 MCG PO TABS: 112.0000 ug | ORAL_TABLET | Freq: Every day | ORAL | 1 refills | Status: DC

## 2021-09-01 NOTE — Progress Notes (Signed)
[facility] [place], [place] [address]  Internal MEDICINE  Office Visit Note  Patient Name: Jamie Bennett  [id number]  [id number]  Date of Service: 09/01/2021  Chief Complaint  Patient presents with   Follow-up   Gastroesophageal Reflux   Anemia   Anxiety   Urinary Tract Infection    HPI Tyrika presents for a follow-up visit to reassess for resolution of UTI.  Urinalysis done in office today was negative for UTI but did show glucose in her urine.  A1c checked at in office today and it was normal at 5.0.  She was referred to urology in January after having recurrent UTIs.  And several years ago did have gross hematuria and was evaluated with cystoscopy although no abnormalities were found at that time. Patient has an ultrasound of the carotid arteries ordered to further evaluate aortic atherosclerosis and risk of carotid stenosis.  This was ordered in September but had not been done yet.  Patient reminded to schedule the ultrasound at the end of her office visit today.    Current Medication: Outpatient Encounter Medications as of 09/01/2021  Medication Sig Note   acetaminophen (TYLENOL) 500 MG tablet Take 500-1,000 mg by mouth every 6 (six) hours as needed (pain.).    albuterol (VENTOLIN HFA) 108 (90 Base) MCG/ACT inhaler Inhale 2 puffs into the lungs every 6 (six) hours as needed for wheezing or shortness of breath.    busPIRone (BUSPAR) 30 MG tablet 1 bid (Patient taking differently: Take 10 mg by mouth 3 (three) times daily. 1 bid)    Calcium Carbonate (CALCIUM 600 PO) Take 600 mg by mouth in the morning and at bedtime.    Cholecalciferol 125 MCG (5000 UT) TABS Take 5,000 Units by mouth in the morning.    clonazePAM (KLONOPIN) 0.5 MG tablet TAKE 1 TABLET BY MOUTH ONCE DAILY AS NEEDED FOR  ACUTE  ANXIETY  EMERGENCIES  ONLY  DO  NOT  EXCEED  ONE  PER  DAY    Coenzyme Q10 300 MG CAPS Take 300 mg by mouth in the morning.    cyanocobalamin (,VITAMIN B-12,)  1000 MCG/ML injection Inject 1,000 mcg into the muscle every 30 (thirty) days.     denosumab (PROLIA) 60 MG/ML SOSY injection Inject 60 mg into the skin every 6 (six) months. 09/01/2021: Last dose: 07/2021   desipramine (NORPRAMIN) 25 MG tablet Take 1 tablet (25 mg total) by mouth daily. (Patient not taking: Reported on 09/21/2021) 09/20/2021: Last filled 02/23/21   esomeprazole (NEXIUM) 20 MG capsule Take 20 mg by mouth in the morning.    hydroxychloroquine (PLAQUENIL) 200 MG tablet TAKE ONE TABLET BY MOUTH TWICE A DAY    hydrOXYzine (ATARAX) 50 MG tablet 1  Tid   1  Prn q day (Patient taking differently: Take 50 mg by mouth 3 (three) times daily as needed for anxiety.)    Krill Oil 500 MG CAPS Take 500 mg by mouth daily at 12 noon.    lamoTRIgine (LAMICTAL) 150 MG tablet Take 1 tablet (150 mg total) by mouth 2 (two) times daily.    loratadine (CLARITIN) 10 MG tablet Take 10 mg by mouth daily as needed for allergies.    magnesium oxide (MAG-OX) 400 MG tablet Take 400 mg by mouth every evening.    Multiple Vitamin (MULTIVITAMIN) tablet Take 1 tablet by mouth every evening.    nitroGLYCERIN (NITROSTAT) 0.4 MG SL tablet Place 1 tablet (0.4 mg total) under the tongue every 5 (  five) minutes as needed for chest pain.    polyethylene glycol (MIRALAX / GLYCOLAX) 17 g packet Take 17 g by mouth in the morning and at bedtime.    psyllium (METAMUCIL) 58.6 % packet Take 1 packet by mouth in the morning.    traZODone (DESYREL) 100 MG tablet Take 1 tablet (100 mg total) by mouth at bedtime as needed and may repeat dose one time if needed for sleep.    venlafaxine (EFFEXOR) 100 MG tablet Take 1 tablet (100 mg total) by mouth 3 (three) times daily with meals.    vitamin C (ASCORBIC ACID) 500 MG tablet Take 500 mg by mouth in the morning.    zinc gluconate 50 MG tablet Take 50 mg by mouth in the morning.    [DISCONTINUED] gabapentin (NEURONTIN) 100 MG capsule TAKE 2 CAPSULES BY MOUTH THREE TIMES DAILY    [DISCONTINUED]  levothyroxine (SYNTHROID) 112 MCG tablet TAKE 1 TABLET DAILY    [DISCONTINUED] mupirocin 2% oint-hydrocortisone 2.5% cream-nystatin cream-zinc oxide 13% oint 1:1:1:5 mixture Apply topically 2 (two) times daily as needed. (Patient not taking: Reported on 09/07/2021)    [DISCONTINUED] mupirocin ointment (BACTROBAN) 2 % Apply 1 application topically 2 (two) times daily. (Patient not taking: Reported on 09/07/2021)    [DISCONTINUED] sucralfate (CARAFATE) 1 g tablet Take 1 tablet (1 g total) by mouth 2 (two) times daily.    [DISCONTINUED] tiZANidine (ZANAFLEX) 4 MG tablet TAKE 1 TABLET BY MOUTH THREE TIMES DAILY (Patient taking differently: Take 4 mg by mouth every 8 (eight) hours as needed for muscle spasms.)    levothyroxine (SYNTHROID) 112 MCG tablet Take 1 tablet (112 mcg total) by mouth daily. (Patient taking differently: Take 112 mcg by mouth daily before breakfast.)    [DISCONTINUED] amoxicillin-clavulanate (AUGMENTIN) 875-125 MG tablet Take 1 tablet by mouth 2 (two) times daily. Take with food. (Patient not taking: Reported on 09/01/2021)    [DISCONTINUED] ketoconazole (NIZORAL) 2 % cream Apply 1 application topically daily. To affected area For 6 weeks or until rash resolves. (Patient not taking: Reported on 09/01/2021)    [DISCONTINUED] nitrofurantoin, macrocrystal-monohydrate, (MACROBID) 100 MG capsule Take 1 cap twice per day for 10 days. (Patient not taking: Reported on 09/01/2021)    [DISCONTINUED] terbinafine (LAMISIL) 250 MG tablet TAKE 1 TABLET BY MOUTH ONCE DAILY FOR  12  WEEKS  FOR  TOENAIL  FUNGAL  INFECTION (Patient not taking: Reported on 09/01/2021)    No facility-administered encounter medications on file as of 09/01/2021.    Surgical History: Past Surgical History:  Procedure Laterality Date   CHOLECYSTECTOMY  2004   COLONOSCOPY N/A 07/16/2017   Procedure: COLONOSCOPY;  Surgeon: Manya Silvas, MD;  Location: [facility] ENDOSCOPY;  Service: Endoscopy;  Laterality: N/A;   EXPLORATORY LAPAROTOMY   2009   GASTRIC BYPASS  2003   [facility]   HEEL SPUR EXCISION Bilateral    LAPAROTOMY N/A 03/01/2018   Procedure: EXPLORATORY LAPAROTOMY/ UMBILECTOMY;  Surgeon: Robert Bellow, MD;  Location: [facility] ORS;  Service: General;  Laterality: N/A;   TONSILLECTOMY     VENTRAL HERNIA REPAIR N/A 03/07/2018   Procedure: HERNIA REPAIR VENTRAL ADULT;  Surgeon: Robert Bellow, MD;  Location: [facility] ORS;  Service: General;  Laterality: N/A;    Medical History: Past Medical History:  Diagnosis Date   Anemia    Anxiety    Bipolar 1 disorder ([place])    Collagen vascular disease ([place])    rhematoid arthritis   Constipation    Dyspnea  Fibromyalgia    GERD (gastroesophageal reflux disease)    Heart murmur    mild-asymptomatic   Hepatic steatosis    History of kidney stones    History of Roux-en-Y gastric bypass    Hypotension    Hypothyroidism    Morbid obesity with BMI of 50.0-59.9, adult (HCC)    Opioid abuse (HCC)    Osteoarthritis    Osteoporosis    PONV (postoperative nausea and vomiting)    nausea only   Rheumatic fever    Rheumatoid arthritis (HCC)    Small bowel obstruction (HCC)    Thyroid disease     Family History: Family History  Problem Relation Age of Onset   Depression Mother    Dementia Mother    Heart disease Mother    Osteoporosis Mother    Parkinson's disease Father    Liver disease Brother    Colon cancer Neg Hx     Social History   Socioeconomic History   Marital status: Married    Spouse name: Not on file   Number of children: Not on file   Years of education: Not on file   Highest education level: Not on file  Occupational History   Not on file  Tobacco Use   Smoking status: Never   Smokeless tobacco: Never  Vaping Use   Vaping Use: Never used  Substance and Sexual Activity   Alcohol use: Not Currently   Drug use: No   Sexual activity: Yes  Other Topics Concern   Not on file  Social History Narrative   Not on file   Social  Determinants of Health   Financial Resource Strain: Not on file  Food Insecurity: Not on file  Transportation Needs: Not on file  Physical Activity: Not on file  Stress: Not on file  Social Connections: Not on file  Intimate Partner Violence: Not on file      Review of Systems  Constitutional:  Negative for chills and fever.  Respiratory: Negative.  Negative for cough, chest tightness and shortness of breath.   Cardiovascular: Negative.  Negative for chest pain and palpitations.  Gastrointestinal:  Negative for abdominal pain, constipation, diarrhea, nausea and vomiting.  Genitourinary: Negative.  Negative for dysuria, frequency and urgency.  Musculoskeletal:  Negative for arthralgias, back pain, joint swelling and neck pain.  Neurological: Negative.   Psychiatric/Behavioral:  Behavioral problem: Depression.    Vital Signs: BP 120/80 Comment: 168/92   Pulse 75    Temp 98.2 F (36.8 C)    Resp 16    Ht 5\' 5"  (1.651 m)    Wt (!) 348 lb (157.9 kg)    SpO2 99%    BMI 57.91 kg/m    Physical Exam Vitals reviewed.  Constitutional:      General: She is not in acute distress.    Appearance: Normal appearance. She is obese. She is not ill-appearing.  HENT:     Head: Normocephalic and atraumatic.  Eyes:     Pupils: Pupils are equal, round, and reactive to light.  Cardiovascular:     Rate and Rhythm: Normal rate and regular rhythm.  Pulmonary:     Effort: Pulmonary effort is normal. No respiratory distress.  Neurological:     Mental Status: She is alert and oriented to person, place, and time.       Assessment/Plan: 1. Glycosuria Patient has glucose in urine on urinalysis but her A1c is normal at 5.0 - POCT glycosylated hemoglobin (Hb A1C)  2. History  of recurrent UTIs She has recurrent UTIs and was referred to urology in January this year  3. Dysuria Urinalysis was negative for UTI - POCT Urinalysis Dipstick  4. Aortic atherosclerosis ([place]) Patient reminded to  schedule carotid ultrasound.  5. Acquired hypothyroidism Stable, levothyroxine refills ordered. - levothyroxine (SYNTHROID) 112 MCG tablet; Take 1 tablet (112 mcg total) by mouth daily. (Patient taking differently: Take 112 mcg by mouth daily before breakfast.)  Dispense: 90 tablet; Refill: 1    General Counseling: Keilee verbalizes understanding of the findings of todays visit and agrees with plan of treatment. I have discussed any further diagnostic evaluation that may be needed or ordered today. We also reviewed her medications today. she has been encouraged to call the office with any questions or concerns that should arise related to todays visit.    Orders Placed This Encounter  Procedures   POCT Urinalysis Dipstick   POCT glycosylated hemoglobin (Hb A1C)    Meds ordered this encounter  Medications   levothyroxine (SYNTHROID) 112 MCG tablet    Sig: Take 1 tablet (112 mcg total) by mouth daily.    Dispense:  90 tablet    Refill:  1    Return in about 6 months (around 03/01/2022) for F/U, [facility] PCP.   Total time spent:30 Minutes Time spent includes review of chart, medications, test results, and follow up plan with the patient.   [place] Controlled Substance Database was reviewed by me.  This patient was seen by Jonetta Osgood, FNP-C in collaboration with Dr. Clayborn Bigness as a part of collaborative care agreement.   Zonya Gudger R. Valetta Fuller, MSN, FNP-C Internal medicine

## 2021-09-07 ENCOUNTER — Other Ambulatory Visit: Payer: Self-pay

## 2021-09-07 ENCOUNTER — Encounter (HOSPITAL_COMMUNITY): Payer: Self-pay | Admitting: Urgent Care

## 2021-09-07 ENCOUNTER — Encounter
Admission: RE | Admit: 2021-09-07 | Discharge: 2021-09-07 | Disposition: A | Discharge status: Home / Self Care | Payer: Medicare HMO | Source: Ambulatory Visit | Attending: Orthopedic Surgery | Admitting: Orthopedic Surgery

## 2021-09-07 DIAGNOSIS — K769 Liver disease, unspecified: Secondary | ICD-10-CM

## 2021-09-07 DIAGNOSIS — E669 Obesity, unspecified: Secondary | ICD-10-CM

## 2021-09-07 HISTORY — DX: Morbid (severe) obesity due to excess calories: E66.01

## 2021-09-07 HISTORY — DX: Unspecified intestinal obstruction, unspecified as to partial versus complete obstruction: K56.609

## 2021-09-07 HISTORY — DX: Dyspnea, unspecified: R06.00

## 2021-09-07 HISTORY — DX: Personal history of urinary calculi: Z87.442

## 2021-09-07 NOTE — Progress Notes (Addendum)
°  Perioperative Services Pre-Admission/Anesthesia Testing    Date: 09/07/21  Name: Jamie Bennett MRN:   [id number]  Re: Clarification regarding malignant hyperthermia  Planned Surgical Procedure(s):    Case: [id number] Date/Time: 09/15/21 0700   Procedure: Left knee subchondroplasty medial tibial plateau (Left: Knee)   Anesthesia type: Choice   Pre-op diagnosis:      Insufficiency fracture of tibia with delayed healing, subsequent encounter  M84.469G     Primary osteoarthritis of left knee  M17.12   Location: [facility] OR ROOM 01 / [place] [facility] FOR ANESTHESIA [facility]   Surgeons: Hessie Knows, MD   Clinical Notes:  Patient scheduled for the above procedure on 09/15/2021 with Dr. Hessie Knows, MD. during PAT interview, patient indicated that she had a history of malignant hyperthermia, however patient returned call to PAT clinic with questions.  After reviewing this condition with the patient, she confirmed that she indeed DOES NOT have a history of malignant hyperthemia with patient anesthetic courses. She denies a familial history of malignant hyperthermia as well. Condition removed from [facility] list in [place]. Message sent to OR administration Milly Jakob, RN) to make her aware that patient DOES NOT have malignant hyperthermia and the case can remain as scheduled.   Honor Loh, MSN, APRN, FNP-C, CEN [facility]  Peri-operative Services Nurse Practitioner Phone: [phone] 09/07/21 2:46 PM  NOTE: This note has been prepared using Dragon dictation software. Despite my best ability to proofread, there is always the potential that unintentional transcriptional errors may still occur from this process.

## 2021-09-07 NOTE — Patient Instructions (Signed)
Your procedure is scheduled on:09-15-21 Thursday Report to the Registration Desk on the 1st floor of the [facility].Then proceed to the 2nd floor Surgery Desk in the Medical mall To find out your arrival time, please call [phone] between 1PM - 3PM on:09-14-21 Wednesday  REMEMBER: Instructions that are not followed completely may result in serious medical risk, up to and including death; or upon the discretion of your surgeon and anesthesiologist your surgery may need to be rescheduled.  Do not eat food after midnight the night before surgery.  No gum chewing, lozengers or hard candies.  You may however, drink CLEAR liquids up to 2 hours before you are scheduled to arrive for your surgery. Do not drink anything within 2 hours of your scheduled arrival time.  Clear liquids include: - water  - apple juice without pulp - gatorade (not RED, PURPLE, OR BLUE) - black coffee or tea (Do NOT add milk or creamers to the coffee or tea) Do NOT drink anything that is not on this list.  In addition, your doctor has ordered for you to drink the provided  Ensure Pre-Surgery Clear Carbohydrate Drink  Drinking this carbohydrate drink up to two hours before surgery helps to reduce insulin resistance and improve patient outcomes. Please complete drinking 2 hours prior to scheduled arrival time.  TAKE THESE MEDICATIONS THE MORNING OF SURGERY WITH A SIP OF WATER: -busPIRone (BUSPAR) -desipramine (NORPRAMIN) -gabapentin (NEURONTIN)  -lamoTRIgine (LAMICTAL)  -levothyroxine (SYNTHROID) -venlafaxine (EFFEXOR)  -esomeprazole (NEXIUM)-take one the night before surgery and another one the morning of surgery -You may take clonazePAM (KLONOPIN) /hydrOXYzine (ATARAX) the day of surgery if needed for anxiety  Use your albuterol (VENTOLIN HFA) Inhaler the day of surgery and bring your inhaler to the hospital  One week prior to surgery: Stop Anti-inflammatories (NSAIDS) such as Advil, Aleve, Ibuprofen,  Motrin, Naproxen, Naprosyn and Aspirin based products such as Excedrin, Goodys Powder, BC Powder.You may however, continue to take Tylenol if needed for pain up until the day of surgery.  Stop ANY OVER THE COUNTER supplements/vitamins 7 days prior to surgery (CALCIUM, Cholecalciferol , Coenzyme Q10 , Krill Oil , magnesium oxide , vitamin C , zinc gluconate)  No Alcohol for 24 hours before or after surgery.  No Smoking including e-cigarettes for 24 hours prior to surgery.  No chewable tobacco products for at least 6 hours prior to surgery.  No nicotine patches on the day of surgery.  Do not use any "recreational" drugs for at least a week prior to your surgery.  Please be advised that the combination of cocaine and anesthesia may have negative outcomes, up to and including death. If you test positive for cocaine, your surgery will be cancelled.  On the morning of surgery brush your teeth with toothpaste and water, you may rinse your mouth with mouthwash if you wish. Do not swallow any toothpaste or mouthwash.  Use CHG Soap as directed on instruction sheet.  Do not wear jewelry, make-up, hairpins, clips or nail polish.  Do not wear lotions, powders, or perfumes.   Do not shave body from the neck down 48 hours prior to surgery just in case you cut yourself which could leave a site for infection.  Also, freshly shaved skin may become irritated if using the CHG soap.  Contact lenses, hearing aids and dentures may not be worn into surgery.  Do not bring valuables to the hospital. [facility] is not responsible for any missing/lost belongings or valuables.   Notify your  doctor if there is any change in your medical condition (cold, fever, infection).  Wear comfortable clothing (specific to your surgery type) to the hospital.  After surgery, you can help prevent lung complications by doing breathing exercises.  Take deep breaths and cough every 1-2 hours. Your doctor may order a device  called an Incentive Spirometer to help you take deep breaths. When coughing or sneezing, hold a pillow firmly against your incision with both hands. This is called splinting. Doing this helps protect your incision. It also decreases belly discomfort.  If you are being admitted to the hospital overnight, leave your suitcase in the car. After surgery it may be brought to your room.  If you are being discharged the day of surgery, you will not be allowed to drive home. You will need a responsible adult (18 years or older) to drive you home and stay with you that night.   If you are taking public transportation, you will need to have a responsible adult (18 years or older) with you. Please confirm with your physician that it is acceptable to use public transportation.   Please call the [place] Dept. at [phone] if you have any questions about these instructions.  Surgery Visitation Policy:  Patients undergoing a surgery or procedure may have one family member or support person with them as long as that person is not COVID-19 positive or experiencing its symptoms.  That person may remain in the waiting area during the procedure and may rotate out with other people.  Inpatient Visitation:    Visiting hours are 7 a.m. to 8 p.m. Up to two visitors ages 16+ are allowed at one time in a patient room. The visitors may rotate out with other people during the day. Visitors must check out when they leave, or other visitors will not be allowed. One designated support person may remain overnight. The visitor must pass COVID-19 screenings, use hand sanitizer when entering and exiting the patients room and wear a mask at all times, including in the patients room. Patients must also wear a mask when staff or their visitor are in the room. Masking is required regardless of vaccination status.

## 2021-09-08 ENCOUNTER — Ambulatory Visit (INDEPENDENT_AMBULATORY_CARE_PROVIDER_SITE_OTHER): Payer: Medicare HMO | Admitting: Urology

## 2021-09-08 ENCOUNTER — Other Ambulatory Visit: Payer: Self-pay

## 2021-09-08 ENCOUNTER — Encounter: Payer: Self-pay | Admitting: Urology

## 2021-09-08 VITALS — BP 140/74 | HR 90 | Ht 66.0 in | Wt 340.0 lb

## 2021-09-08 DIAGNOSIS — N390 Urinary tract infection, site not specified: Secondary | ICD-10-CM

## 2021-09-08 LAB — URINALYSIS, COMPLETE
Bilirubin, UA: NEGATIVE
Glucose, UA: NEGATIVE
Ketones, UA: NEGATIVE
Nitrite, UA: POSITIVE — AB
Specific Gravity, UA: 1.025 (ref 1.005–1.030)
Urobilinogen, Ur: 0.2 mg/dL (ref 0.2–1.0)
pH, UA: 6 (ref 5.0–7.5)

## 2021-09-08 LAB — MICROSCOPIC EXAMINATION: WBC, UA: >30 /hpf — AB (ref 0–5)

## 2021-09-08 MED ORDER — ESTRADIOL 0.1 MG/GM VA CREA: TOPICAL_CREAM | VAGINAL | 12 refills | Status: DC

## 2021-09-08 MED ORDER — CEFUROXIME AXETIL 500 MG PO TABS: 500.0000 mg | ORAL_TABLET | Freq: Two times a day (BID) | ORAL | 0 refills | Status: AC

## 2021-09-08 NOTE — Patient Instructions (Signed)
[place]

## 2021-09-08 NOTE — Progress Notes (Signed)
09/08/2021 3:16 PM   Jamie Bennett 1961-01-18 [id number]  Referring provider: Mylinda Latina, PA-C [place],  [place] [address]  Chief Complaint  Patient presents with   Recurrent UTI    HPI: Jamie Bennett is a 61 y.o. female referred for evaluation of recurrent UTIs.  States she has had several symptomatic UTIs since December 2022 Chart review of available records remarkable for positive urine culture E. coli 08/01/2021; E. coli 08/18/2021 Symptoms typically consist of urinary frequency, urgency, dysuria and cloudy urine Improved with antibiotics then recur She had onset of mild dysuria and cloudy urine today No febrile UTIs or pyelonephritis She is not sexually active History of gross hematuria ~ 15 years ago and underwent imaging and cystoscopy which was unremarkable Prior history stone disease CT abdomen/pelvis 2019 showed no GU abnormalities or urinary calculi   PMH: Past Medical History:  Diagnosis Date   Anemia    Anxiety    Bipolar 1 disorder (HCC)    Collagen vascular disease ([place])    rhematoid arthritis   Constipation    Dyspnea    Fibromyalgia    GERD (gastroesophageal reflux disease)    Heart murmur    mild-asymptomatic   Hepatic steatosis    History of kidney stones    History of Roux-en-Y gastric bypass    Hypotension    Hypothyroidism    Morbid obesity with BMI of 50.0-59.9, adult (HCC)    Opioid abuse ([place])    Osteoarthritis    Osteoporosis    PONV (postoperative nausea and vomiting)    nausea only   Rheumatic fever    Rheumatoid arthritis ([place])    Small bowel obstruction ([place])    Thyroid disease     Surgical History: Past Surgical History:  Procedure Laterality Date   CHOLECYSTECTOMY  2004   COLONOSCOPY N/A 07/16/2017   Procedure: COLONOSCOPY;  Surgeon: Manya Silvas, MD;  Location: [facility] ENDOSCOPY;  Service: Endoscopy;  Laterality: N/A;   EXPLORATORY LAPAROTOMY  2009   GASTRIC BYPASS  2003   [facility]   HEEL SPUR EXCISION Bilateral    LAPAROTOMY N/A 03/01/2018   Procedure: EXPLORATORY LAPAROTOMY/ UMBILECTOMY;  Surgeon: Robert Bellow, MD;  Location: [facility] ORS;  Service: General;  Laterality: N/A;   TONSILLECTOMY     VENTRAL HERNIA REPAIR N/A 03/07/2018   Procedure: HERNIA REPAIR VENTRAL ADULT;  Surgeon: Robert Bellow, MD;  Location: [facility] ORS;  Service: General;  Laterality: N/A;    Home Medications:  Allergies as of 09/08/2021       Reactions   Lactose Intolerance (gi) Other (See Comments)   Bloating and GI distress   Sulfa Antibiotics Hives        Medication List        Accurate as of September 08, 2021  3:16 PM. If you have any questions, ask your nurse or doctor.          acetaminophen 500 MG tablet Commonly known as: TYLENOL Take 500-1,000 mg by mouth every 6 (six) hours as needed (pain.).   albuterol 108 (90 Base) MCG/ACT inhaler Commonly known as: VENTOLIN HFA Inhale 2 puffs into the lungs every 6 (six) hours as needed for wheezing or shortness of breath.   busPIRone 30 MG tablet Commonly known as: BUSPAR 1 bid What changed:  how much to take how to take this when to take this   CALCIUM 600 PO Take 600 mg by mouth in the morning and at bedtime.  Cholecalciferol 125 MCG (5000 UT) Tabs Take 5,000 Units by mouth in the morning.   clonazePAM 0.5 MG tablet Commonly known as: KLONOPIN TAKE 1 TABLET BY MOUTH ONCE DAILY AS NEEDED FOR  ACUTE  ANXIETY  EMERGENCIES  ONLY  DO  NOT  EXCEED  ONE  PER  DAY   Coenzyme Q10 300 MG Caps Take 300 mg by mouth in the morning.   cyanocobalamin 1000 MCG/ML injection Commonly known as: (VITAMIN B-12) Inject 1,000 mcg into the muscle every 30 (thirty) days.   denosumab 60 MG/ML Sosy injection Commonly known as: PROLIA Inject 60 mg into the skin every 6 (six) months.   desipramine 25 MG tablet Commonly known as: Norpramin Take 1 tablet (25 mg total) by mouth daily. What changed: when to take this    esomeprazole 20 MG capsule Commonly known as: NEXIUM Take 20 mg by mouth in the morning.   gabapentin 100 MG capsule Commonly known as: NEURONTIN TAKE 2 CAPSULES BY MOUTH THREE TIMES DAILY   hydroxychloroquine 200 MG tablet Commonly known as: PLAQUENIL TAKE ONE TABLET BY MOUTH TWICE A DAY   hydrOXYzine 50 MG tablet Commonly known as: ATARAX 1  Tid   1  Prn q day What changed:  how much to take how to take this when to take this reasons to take this additional instructions   Krill Oil 500 MG Caps Take 500 mg by mouth daily at 12 noon.   lamoTRIgine 150 MG tablet Commonly known as: LAMICTAL Take 1 tablet (150 mg total) by mouth 2 (two) times daily.   levothyroxine 112 MCG tablet Commonly known as: SYNTHROID Take 1 tablet (112 mcg total) by mouth daily. What changed: when to take this   loratadine 10 MG tablet Commonly known as: CLARITIN Take 10 mg by mouth daily as needed for allergies.   magnesium oxide 400 MG tablet Commonly known as: MAG-OX Take 400 mg by mouth every evening.   multivitamin tablet Take 1 tablet by mouth every evening.   mupirocin 2% oint-hydrocortisone 2.5% cream-nystatin cream-zinc oxide 13% oint 1:1:1:5 mixture Apply topically 2 (two) times daily as needed.   mupirocin ointment 2 % Commonly known as: BACTROBAN Apply 1 application topically 2 (two) times daily.   nitroGLYCERIN 0.4 MG SL tablet Commonly known as: NITROSTAT Place 1 tablet (0.4 mg total) under the tongue every 5 (five) minutes as needed for chest pain.   polyethylene glycol 17 g packet Commonly known as: MIRALAX / GLYCOLAX Take 17 g by mouth in the morning and at bedtime.   psyllium 58.6 % packet Commonly known as: METAMUCIL Take 1 packet by mouth in the morning.   sucralfate 1 g tablet Commonly known as: Carafate Take 1 tablet (1 g total) by mouth 2 (two) times daily.   tiZANidine 4 MG tablet Commonly known as: ZANAFLEX TAKE 1 TABLET BY MOUTH THREE TIMES  DAILY What changed:  when to take this reasons to take this   traZODone 100 MG tablet Commonly known as: DESYREL Take 1 tablet (100 mg total) by mouth at bedtime as needed and may repeat dose one time if needed for sleep.   venlafaxine 100 MG tablet Commonly known as: EFFEXOR Take 1 tablet (100 mg total) by mouth 3 (three) times daily with meals.   vitamin C 500 MG tablet Commonly known as: ASCORBIC ACID Take 500 mg by mouth in the morning.   zinc gluconate 50 MG tablet Take 50 mg by mouth in the morning.  Allergies:  Allergies  Allergen Reactions   Lactose Intolerance (Gi) Other (See Comments)    Bloating and GI distress   Sulfa Antibiotics Hives    Family History: Family History  Problem Relation Age of Onset   Depression Mother    Dementia Mother    Heart disease Mother    Osteoporosis Mother    Parkinson's disease Father    Liver disease Brother    Colon cancer Neg Hx     Social History:  reports that she has never smoked. She has never used smokeless tobacco. She reports that she does not currently use alcohol. She reports that she does not use drugs.   Physical Exam: BP 140/74    Pulse 90    Ht 5\' 6"  (1.676 m)    Wt (!) 340 lb (154.2 kg)    BMI 54.88 kg/m   Constitutional:  Alert and oriented, No acute distress. HEENT: [place] AT, moist mucus membranes.  Trachea midline, no masses. Cardiovascular: No clubbing, cyanosis, or edema. Respiratory: Normal respiratory effort, no increased work of breathing. Psychiatric: Normal mood and affect.  Laboratory Data:  Urinalysis Appearance-yellow cloudy Dipstick-trace blood/nitrite +/3+ leukocytes Microscopy- >30 WBC/many bacteria   Assessment & Plan:    1. Recurrent UTI UTI today with symptoms Urine culture ordered Start cefuroxime 500 mg twice daily pending culture results We discussed the evaluation and treatment of patients with recurrent UTIs at length.  Possible etiologies of recurrent infection  include periurethral tissue atrophy in postmenopausal woman, constipation, sexual activity, incomplete emptying, anatomic abnormalities, and even genetic predisposition.  Finally, we discussed the role of perineal hygiene, timed voiding, adequate hydration, topical vaginal estrogen, cranberry prophylaxis, and low-dose antibiotic prophylaxis. Recommend starting low-dose vaginal estrogen and Rx Estrace sent to pharmacy Once her urine culture from today returns recommend 6-8-week course of low-dose antibiotic prophylaxis based on sensitivities Follow-up 3 months and instructed call earlier for recurrent symptoms   Abbie Sons, MD  [facility] [address], [place], [place] [address] [phone]

## 2021-09-09 ENCOUNTER — Encounter
Admission: RE | Admit: 2021-09-09 | Discharge: 2021-09-09 | Disposition: A | Discharge status: Home / Self Care | Payer: Medicare HMO | Source: Ambulatory Visit | Attending: Orthopedic Surgery | Admitting: Orthopedic Surgery

## 2021-09-09 DIAGNOSIS — K769 Liver disease, unspecified: Secondary | ICD-10-CM | POA: Diagnosis not present

## 2021-09-09 DIAGNOSIS — Z01812 Encounter for preprocedural laboratory examination: Secondary | ICD-10-CM | POA: Insufficient documentation

## 2021-09-09 LAB — COMPREHENSIVE METABOLIC PANEL
ALT: 14 U/L (ref 0–44)
AST: 22 U/L (ref 15–41)
Albumin: 3.8 g/dL (ref 3.5–5.0)
Alkaline Phosphatase: 90 U/L (ref 38–126)
Anion gap: 8 (ref 5–15)
BUN: 14 mg/dL (ref 8–23)
CO2: 20 mmol/L — ABNORMAL LOW (ref 22–32)
Calcium: 8.4 mg/dL — ABNORMAL LOW (ref 8.9–10.3)
Chloride: 113 mmol/L — ABNORMAL HIGH (ref 98–111)
Creatinine, Ser: 0.89 mg/dL (ref 0.44–1.00)
GFR, Estimated: >60 mL/min (ref >60)
Glucose, Bld: 93 mg/dL (ref 70–99)
Potassium: 3.6 mmol/L (ref 3.5–5.1)
Sodium: 141 mmol/L (ref 135–145)
Total Bilirubin: 0.2 mg/dL — ABNORMAL LOW (ref 0.3–1.2)
Total Protein: 6.8 g/dL (ref 6.5–8.1)

## 2021-09-10 ENCOUNTER — Other Ambulatory Visit: Payer: Self-pay | Admitting: Internal Medicine

## 2021-09-10 DIAGNOSIS — K269 Duodenal ulcer, unspecified as acute or chronic, without hemorrhage or perforation: Secondary | ICD-10-CM

## 2021-09-11 ENCOUNTER — Other Ambulatory Visit: Payer: Self-pay | Admitting: Nurse Practitioner

## 2021-09-11 ENCOUNTER — Encounter: Payer: Self-pay | Admitting: Urology

## 2021-09-11 LAB — CULTURE, URINE COMPREHENSIVE

## 2021-09-12 ENCOUNTER — Other Ambulatory Visit: Payer: Self-pay | Admitting: Urology

## 2021-09-12 ENCOUNTER — Encounter: Payer: Self-pay | Admitting: *Deleted

## 2021-09-12 ENCOUNTER — Telehealth: Payer: Self-pay | Admitting: *Deleted

## 2021-09-12 MED ORDER — NITROFURANTOIN MACROCRYSTAL 50 MG PO CAPS: 50.0000 mg | ORAL_CAPSULE | Freq: Every day | ORAL | 2 refills | Status: DC

## 2021-09-12 NOTE — Telephone Encounter (Signed)
Notified patient as instructed, patient pleased °

## 2021-09-12 NOTE — Telephone Encounter (Signed)
-----   Message from Abbie Sons, MD sent at 09/12/2021  7:15 AM EST ----- Urine culture was positive and sensitive to prescribed antibiotic.  I sent in an Rx for a low-dose antibiotic to take daily.  Start after she completes her course of cefuroxime.

## 2021-09-13 ENCOUNTER — Inpatient Hospital Stay: Payer: Medicare HMO

## 2021-09-13 ENCOUNTER — Other Ambulatory Visit: Payer: Self-pay | Admitting: Orthopedic Surgery

## 2021-09-15 ENCOUNTER — Encounter: Admission: RE | Payer: Self-pay | Source: Home / Self Care

## 2021-09-15 ENCOUNTER — Ambulatory Visit: Admission: RE | Admit: 2021-09-15 | Payer: Medicare HMO | Source: Home / Self Care | Admitting: Orthopedic Surgery

## 2021-09-15 SURGERY — ARTHROSCOPY, KNEE, WITH SUBCHONDROPLASTY
Anesthesia: Choice | Site: Knee | Laterality: Left

## 2021-09-20 ENCOUNTER — Inpatient Hospital Stay: Payer: Medicare HMO | Attending: Oncology

## 2021-09-21 ENCOUNTER — Other Ambulatory Visit: Payer: Self-pay | Admitting: Nurse Practitioner

## 2021-09-21 ENCOUNTER — Other Ambulatory Visit: Payer: Self-pay

## 2021-09-21 ENCOUNTER — Encounter
Admission: RE | Admit: 2021-09-21 | Discharge: 2021-09-21 | Disposition: A | Discharge status: Home / Self Care | Payer: Medicare HMO | Source: Ambulatory Visit | Attending: Orthopedic Surgery | Admitting: Orthopedic Surgery

## 2021-09-21 DIAGNOSIS — G6189 Other inflammatory polyneuropathies: Secondary | ICD-10-CM

## 2021-09-21 DIAGNOSIS — M542 Cervicalgia: Secondary | ICD-10-CM

## 2021-09-21 NOTE — Patient Instructions (Addendum)
Your procedure is scheduled on: 09/27/2021 Report to the Registration Desk on the 1st floor of the [facility]. To find out your arrival time, please call [phone] between 1PM - 3PM on: 09/26/2021  REMEMBER: Instructions that are not followed completely may result in serious medical risk, up to and including death; or upon the discretion of your surgeon and anesthesiologist your surgery may need to be rescheduled.  Do not eat food after midnight the night before surgery.  No gum chewing, lozengers or hard candies.  You may however, drink clear liquids up to 2 hours before you are scheduled to arrive for your surgery. Do not drink anything within 2 hours of your scheduled arrival time.  Clear liquids include: - water  - apple juice without pulp - gatorade (not RED, PURPLE, OR BLUE) - black coffee or tea (Do NOT add milk or creamers to the coffee or tea) Do NOT drink anything that is not on this list.  Type 1 and Type 2 diabetics should only drink water.  In addition, your doctor has ordered for you to drink the provided  Ensure Pre-Surgery Clear Carbohydrate Drink  Drinking this carbohydrate drink up to two hours before surgery helps to reduce insulin resistance and improve patient outcomes. Please complete drinking 2 hours prior to scheduled arrival time.  TAKE THESE MEDICATIONS THE MORNING OF SURGERY WITH A SIP OF WATER: - busPIRone (BUSPAR) - desipramine (NORPRAMIN) - gabapentin (NEURONTIN) - lamoTRIgine (LAMICTAL) - levothyroxine (SYNTHROID)  - venlafaxine (EFFEXOR) - esomeprazole (NEXIUM) (take one the night before and one on the morning of surgery - helps to prevent nausea after surgery.) - You may take your clonazePAM [facility]) if you need it  Use albuterol (VENTOLIN HFA) inhaler on the day of surgery and bring to the hospital.   One week prior to surgery: Stop Anti-inflammatories (NSAIDS) such as Advil, Aleve, Ibuprofen, Motrin, Naproxen, Naprosyn and Aspirin  based products such as Excedrin, Goodys Powder, BC Powder. You may however, continue to take Tylenol if needed for pain up until the day of surgery.  Stop any over the counter vitamins and supplements until after surgery. This includes: Calcium, cholecalciferol, coenzyme Q10, krill oil, magnesium oxide, vitamin c, and zinc gluconate.    No Alcohol for 24 hours before or after surgery.  No Smoking including e-cigarettes for 24 hours prior to surgery.  No chewable tobacco products for at least 6 hours prior to surgery.  No nicotine patches on the day of surgery.  Do not use any "recreational" drugs for at least a week prior to your surgery.  Please be advised that the combination of cocaine and anesthesia may have negative outcomes, up to and including death. If you test positive for cocaine, your surgery will be cancelled.  On the morning of surgery brush your teeth with toothpaste and water, you may rinse your mouth with mouthwash if you wish. Do not swallow any toothpaste or mouthwash.  Use CHG Soap or wipes as directed on instruction sheet.  Do not wear jewelry, make-up, hairpins, clips or nail polish.  Do not wear lotions, powders, or perfumes.   Do not shave body from the neck down 48 hours prior to surgery just in case you cut yourself which could leave a site for infection.  Also, freshly shaved skin may become irritated if using the CHG soap.  Do not bring valuables to the hospital. [facility] is not responsible for any missing/lost belongings or valuables.   Notify your doctor if there  is any change in your medical condition (cold, fever, infection).  Wear comfortable clothing (specific to your surgery type) to the hospital.  After surgery, you can help prevent lung complications by doing breathing exercises.  Take deep breaths and cough every 1-2 hours. Your doctor may order a device called an Incentive Spirometer to help you take deep breaths.  If you are being admitted  to the hospital overnight, leave your suitcase in the car. After surgery it may be brought to your room.  If you are being discharged the day of surgery, you will not be allowed to drive home. You will need a responsible adult (18 years or older) to drive you home and stay with you that night.   If you are taking public transportation, you will need to have a responsible adult (18 years or older) with you. Please confirm with your physician that it is acceptable to use public transportation.   Please call the [facility] at [phone] if you have any questions about these instructions.  Surgery Visitation Policy:  Patients undergoing a surgery or procedure may have one family member or support person in the surgical waiting area with them as long as that person is not COVID-19 positive or experiencing its symptoms.  The patient is not allowed to have any visitors in the pre-operative area. Visitors must remain in the surgical waiting room.   Inpatient Visitation:    Visiting hours are 7 a.m. to 8 p.m. Up to two visitors ages 16+ are allowed at one time in a patient room. The visitors may rotate out with other people during the day. Visitors must check out when they leave, or other visitors will not be allowed. One designated support person may remain overnight. The visitor must pass COVID-19 screenings, use hand sanitizer when entering and exiting the patients room and wear a mask at all times, including in the patients room. Patients must also wear a mask when staff or their visitor are in the room. Masking is required regardless of vaccination status.

## 2021-09-24 ENCOUNTER — Encounter: Payer: Self-pay | Admitting: Nurse Practitioner

## 2021-09-26 MED ORDER — APREPITANT 40 MG PO CAPS
40.0000 mg | ORAL_CAPSULE | Freq: Once | ORAL | Status: AC
  Administered 2021-09-27: 40 mg via ORAL

## 2021-09-27 ENCOUNTER — Ambulatory Visit (HOSPITAL_COMMUNITY): Payer: Medicare HMO | Admitting: Psychiatry

## 2021-09-27 ENCOUNTER — Ambulatory Visit: Payer: Medicare HMO | Admitting: Anesthesiology

## 2021-09-27 ENCOUNTER — Ambulatory Visit: Payer: Medicare HMO

## 2021-09-27 ENCOUNTER — Other Ambulatory Visit: Payer: Self-pay

## 2021-09-27 ENCOUNTER — Encounter
Admission: RE | Disposition: A | Discharge status: Home / Self Care | Payer: Self-pay | Source: Home / Self Care | Attending: Orthopedic Surgery

## 2021-09-27 ENCOUNTER — Encounter: Payer: Self-pay | Admitting: Orthopedic Surgery

## 2021-09-27 ENCOUNTER — Ambulatory Visit
Admission: RE | Admit: 2021-09-27 | Discharge: 2021-09-27 | Disposition: A | Discharge status: Home / Self Care | Payer: Medicare HMO | Attending: Orthopedic Surgery | Admitting: Orthopedic Surgery

## 2021-09-27 ENCOUNTER — Other Ambulatory Visit: Payer: Self-pay | Admitting: Orthopedic Surgery

## 2021-09-27 DIAGNOSIS — M069 Rheumatoid arthritis, unspecified: Secondary | ICD-10-CM | POA: Diagnosis not present

## 2021-09-27 DIAGNOSIS — Z6841 Body Mass Index (BMI) 40.0 and over, adult: Secondary | ICD-10-CM | POA: Diagnosis not present

## 2021-09-27 DIAGNOSIS — E039 Hypothyroidism, unspecified: Secondary | ICD-10-CM | POA: Insufficient documentation

## 2021-09-27 DIAGNOSIS — Z419 Encounter for procedure for purposes other than remedying health state, unspecified: Secondary | ICD-10-CM

## 2021-09-27 DIAGNOSIS — K219 Gastro-esophageal reflux disease without esophagitis: Secondary | ICD-10-CM | POA: Insufficient documentation

## 2021-09-27 DIAGNOSIS — R69 Illness, unspecified: Secondary | ICD-10-CM | POA: Diagnosis not present

## 2021-09-27 DIAGNOSIS — R112 Nausea with vomiting, unspecified: Secondary | ICD-10-CM | POA: Insufficient documentation

## 2021-09-27 DIAGNOSIS — M84469G Pathological fracture, unspecified tibia and fibula, subsequent encounter for fracture with delayed healing: Secondary | ICD-10-CM | POA: Diagnosis not present

## 2021-09-27 DIAGNOSIS — M1712 Unilateral primary osteoarthritis, left knee: Secondary | ICD-10-CM | POA: Diagnosis not present

## 2021-09-27 DIAGNOSIS — F419 Anxiety disorder, unspecified: Secondary | ICD-10-CM | POA: Insufficient documentation

## 2021-09-27 DIAGNOSIS — F319 Bipolar disorder, unspecified: Secondary | ICD-10-CM | POA: Insufficient documentation

## 2021-09-27 DIAGNOSIS — Z96652 Presence of left artificial knee joint: Secondary | ICD-10-CM | POA: Diagnosis not present

## 2021-09-27 DIAGNOSIS — Z9884 Bariatric surgery status: Secondary | ICD-10-CM | POA: Insufficient documentation

## 2021-09-27 DIAGNOSIS — Z471 Aftercare following joint replacement surgery: Secondary | ICD-10-CM | POA: Diagnosis not present

## 2021-09-27 DIAGNOSIS — M84462G Pathological fracture, left tibia, subsequent encounter for fracture with delayed healing: Secondary | ICD-10-CM | POA: Insufficient documentation

## 2021-09-27 DIAGNOSIS — M797 Fibromyalgia: Secondary | ICD-10-CM | POA: Diagnosis not present

## 2021-09-27 HISTORY — PX: KNEE ARTHROSCOPY WITH SUBCHONDROPLASTY: SHX6732

## 2021-09-27 SURGERY — ARTHROSCOPY, KNEE, WITH SUBCHONDROPLASTY
Anesthesia: General | Site: Knee | Laterality: Left

## 2021-09-27 MED ORDER — SUCCINYLCHOLINE CHLORIDE 200 MG/10ML IV SOSY
PREFILLED_SYRINGE | INTRAVENOUS | Status: AC
  Filled 2021-09-27: qty 10

## 2021-09-27 MED ORDER — DEXTROSE 5 % IV SOLN
INTRAVENOUS | Status: DC | PRN
  Administered 2021-09-27: 3 g via INTRAVENOUS

## 2021-09-27 MED ORDER — CHLORHEXIDINE GLUCONATE 0.12 % MT SOLN
15.0000 mL | Freq: Once | OROMUCOSAL | Status: AC
  Administered 2021-09-27: 15 mL via OROMUCOSAL

## 2021-09-27 MED ORDER — CEFAZOLIN SODIUM 1 G IJ SOLR
INTRAMUSCULAR | Status: AC
  Filled 2021-09-27: qty 10

## 2021-09-27 MED ORDER — KETOROLAC TROMETHAMINE 30 MG/ML IJ SOLN
INTRAMUSCULAR | Status: DC | PRN
  Administered 2021-09-27: 30 mg via INTRAVENOUS

## 2021-09-27 MED ORDER — ONDANSETRON HCL 4 MG/2ML IJ SOLN: 4.0000 mg | Freq: Once | INTRAMUSCULAR | Status: DC | PRN

## 2021-09-27 MED ORDER — ROCURONIUM BROMIDE 10 MG/ML (PF) SYRINGE
PREFILLED_SYRINGE | INTRAVENOUS | Status: AC
Start: 2021-09-27 — End: ?
  Filled 2021-09-27: qty 10

## 2021-09-27 MED ORDER — SUGAMMADEX SODIUM 500 MG/5ML IV SOLN
INTRAVENOUS | Status: AC
  Filled 2021-09-27: qty 5

## 2021-09-27 MED ORDER — OXYCODONE HCL 5 MG PO TABS
ORAL_TABLET | ORAL | Status: AC
  Filled 2021-09-27: qty 1

## 2021-09-27 MED ORDER — LACTATED RINGERS IV SOLN: INTRAVENOUS | Status: DC

## 2021-09-27 MED ORDER — DEXAMETHASONE SODIUM PHOSPHATE 10 MG/ML IJ SOLN
INTRAMUSCULAR | Status: DC | PRN
  Administered 2021-09-27: 5 mg via INTRAVENOUS

## 2021-09-27 MED ORDER — CHLORHEXIDINE GLUCONATE 0.12 % MT SOLN
OROMUCOSAL | Status: AC
  Filled 2021-09-27: qty 15

## 2021-09-27 MED ORDER — MIDAZOLAM HCL 2 MG/2ML IJ SOLN
INTRAMUSCULAR | Status: AC
  Filled 2021-09-27: qty 2

## 2021-09-27 MED ORDER — ACETAMINOPHEN 10 MG/ML IV SOLN: 1000.0000 mg | Freq: Once | INTRAVENOUS | Status: DC | PRN

## 2021-09-27 MED ORDER — FENTANYL CITRATE (PF) 100 MCG/2ML IJ SOLN
INTRAMUSCULAR | Status: AC
  Filled 2021-09-27: qty 2

## 2021-09-27 MED ORDER — ACETAMINOPHEN 10 MG/ML IV SOLN
INTRAVENOUS | Status: AC
  Filled 2021-09-27: qty 100

## 2021-09-27 MED ORDER — BUPIVACAINE-EPINEPHRINE (PF) 0.5% -1:200000 IJ SOLN
INTRAMUSCULAR | Status: DC | PRN
  Administered 2021-09-27: 30 mL

## 2021-09-27 MED ORDER — NEOMYCIN-POLYMYXIN B GU 40-200000 IR SOLN
Status: AC
  Filled 2021-09-27: qty 2

## 2021-09-27 MED ORDER — LIDOCAINE HCL (PF) 2 % IJ SOLN
INTRAMUSCULAR | Status: AC
  Filled 2021-09-27: qty 5

## 2021-09-27 MED ORDER — BUPIVACAINE-EPINEPHRINE (PF) 0.5% -1:200000 IJ SOLN
INTRAMUSCULAR | Status: AC
  Filled 2021-09-27: qty 30

## 2021-09-27 MED ORDER — SUCCINYLCHOLINE CHLORIDE 200 MG/10ML IV SOSY
PREFILLED_SYRINGE | INTRAVENOUS | Status: DC | PRN
  Administered 2021-09-27: 120 mg via INTRAVENOUS

## 2021-09-27 MED ORDER — OXYCODONE HCL 5 MG/5ML PO SOLN
5.0000 mg | Freq: Once | ORAL | Status: AC | PRN
  Administered 2021-09-27: 5 mg via ORAL

## 2021-09-27 MED ORDER — SCOPOLAMINE 1 MG/3DAYS TD PT72
MEDICATED_PATCH | TRANSDERMAL | Status: AC
  Filled 2021-09-27: qty 1

## 2021-09-27 MED ORDER — FENTANYL CITRATE (PF) 100 MCG/2ML IJ SOLN
INTRAMUSCULAR | Status: AC
  Administered 2021-09-27: 50 ug via INTRAVENOUS
  Filled 2021-09-27: qty 2

## 2021-09-27 MED ORDER — ORAL CARE MOUTH RINSE: 15.0000 mL | Freq: Once | OROMUCOSAL | Status: AC

## 2021-09-27 MED ORDER — ACETAMINOPHEN 10 MG/ML IV SOLN
INTRAVENOUS | Status: DC | PRN
  Administered 2021-09-27: 1000 mg via INTRAVENOUS

## 2021-09-27 MED ORDER — LACTATED RINGERS IR SOLN
Status: DC | PRN
  Administered 2021-09-27: 3000 mL

## 2021-09-27 MED ORDER — OXYCODONE HCL 5 MG PO TABS: 5.0000 mg | ORAL_TABLET | Freq: Once | ORAL | Status: AC | PRN

## 2021-09-27 MED ORDER — KETOROLAC TROMETHAMINE 30 MG/ML IJ SOLN
INTRAMUSCULAR | Status: AC
  Filled 2021-09-27: qty 1

## 2021-09-27 MED ORDER — FENTANYL CITRATE (PF) 100 MCG/2ML IJ SOLN
25.0000 ug | INTRAMUSCULAR | Status: DC | PRN
  Administered 2021-09-27: 50 ug via INTRAVENOUS

## 2021-09-27 MED ORDER — APREPITANT 40 MG PO CAPS
ORAL_CAPSULE | ORAL | Status: AC
  Filled 2021-09-27: qty 1

## 2021-09-27 MED ORDER — ROCURONIUM BROMIDE 100 MG/10ML IV SOLN
INTRAVENOUS | Status: DC | PRN
Start: 2021-09-27 — End: 2021-09-27
  Administered 2021-09-27: 30 mg via INTRAVENOUS

## 2021-09-27 MED ORDER — ONDANSETRON HCL 4 MG/2ML IJ SOLN
INTRAMUSCULAR | Status: AC
  Filled 2021-09-27: qty 2

## 2021-09-27 MED ORDER — CEFAZOLIN SODIUM-DEXTROSE 2-4 GM/100ML-% IV SOLN
INTRAVENOUS | Status: AC
  Filled 2021-09-27: qty 100

## 2021-09-27 MED ORDER — DEXAMETHASONE SODIUM PHOSPHATE 10 MG/ML IJ SOLN
INTRAMUSCULAR | Status: AC
  Filled 2021-09-27: qty 1

## 2021-09-27 MED ORDER — ROCURONIUM BROMIDE 100 MG/10ML IV SOLN: INTRAVENOUS | Status: DC | PRN

## 2021-09-27 MED ORDER — ONDANSETRON HCL 4 MG/2ML IJ SOLN
INTRAMUSCULAR | Status: DC | PRN
  Administered 2021-09-27: 4 mg via INTRAVENOUS

## 2021-09-27 MED ORDER — MIDAZOLAM HCL 2 MG/2ML IJ SOLN
INTRAMUSCULAR | Status: DC | PRN
  Administered 2021-09-27: 2 mg via INTRAVENOUS

## 2021-09-27 MED ORDER — LIDOCAINE HCL (CARDIAC) PF 100 MG/5ML IV SOSY
PREFILLED_SYRINGE | INTRAVENOUS | Status: DC | PRN
Start: 2021-09-27 — End: 2021-09-27
  Administered 2021-09-27: 100 mg via INTRAVENOUS

## 2021-09-27 MED ORDER — FENTANYL CITRATE (PF) 100 MCG/2ML IJ SOLN
INTRAMUSCULAR | Status: DC | PRN
  Administered 2021-09-27: 100 ug via INTRAVENOUS

## 2021-09-27 MED ORDER — HYDROCODONE-ACETAMINOPHEN 7.5-325 MG PO TABS: 1.0000 | ORAL_TABLET | Freq: Four times a day (QID) | ORAL | 0 refills | Status: DC | PRN

## 2021-09-27 MED ORDER — SUGAMMADEX SODIUM 200 MG/2ML IV SOLN
INTRAVENOUS | Status: DC | PRN
  Administered 2021-09-27: 350 mg via INTRAVENOUS

## 2021-09-27 MED ORDER — SCOPOLAMINE 1 MG/3DAYS TD PT72
1.0000 | MEDICATED_PATCH | TRANSDERMAL | Status: DC
  Administered 2021-09-27: 1.5 mg via TRANSDERMAL

## 2021-09-27 MED ORDER — PROPOFOL 10 MG/ML IV BOLUS
INTRAVENOUS | Status: DC | PRN
  Administered 2021-09-27: 200 mg via INTRAVENOUS

## 2021-09-27 SURGICAL SUPPLY — 36 items
APL PRP STRL LF DISP 70% ISPRP (MISCELLANEOUS) ×2
BLADE INCISOR PLUS 4.5 (BLADE) ×3 IMPLANT
BNDG ELASTIC 4X5.8 VLCR STR LF (GAUZE/BANDAGES/DRESSINGS) ×3 IMPLANT
BNDG ELASTIC 6X5.8 VLCR STR LF (GAUZE/BANDAGES/DRESSINGS) ×1 IMPLANT
CHLORAPREP W/TINT 26 (MISCELLANEOUS) ×4 IMPLANT
CUFF TOURN SGL QUICK 24 (TOURNIQUET CUFF)
CUFF TOURN SGL QUICK 34 (TOURNIQUET CUFF)
CUFF TRNQT CYL 24X4X16.5-23 (TOURNIQUET CUFF) IMPLANT
CUFF TRNQT CYL 34X4.125X (TOURNIQUET CUFF) IMPLANT
DRAPE ARTHRO LIMB 89X125 STRL (DRAPES) ×3 IMPLANT
DRAPE C-ARM XRAY 36X54 (DRAPES) ×3 IMPLANT
DRAPE C-ARMOR (DRAPES) ×3 IMPLANT
GAUZE SPONGE 4X4 12PLY STRL (GAUZE/BANDAGES/DRESSINGS) ×3 IMPLANT
GLOVE SURG SYN 9.0  PF PI (GLOVE) ×2
GLOVE SURG SYN 9.0 PF PI (GLOVE) ×2 IMPLANT
GOWN SRG 2XL LVL 4 RGLN SLV (GOWNS) ×2 IMPLANT
GOWN STRL NON-REIN 2XL LVL4 (GOWNS) ×2
GOWN STRL REUS W/ TWL LRG LVL3 (GOWN DISPOSABLE) ×2 IMPLANT
GOWN STRL REUS W/TWL LRG LVL3 (GOWN DISPOSABLE) ×2
GRAFT FILLER BONE 5ML (Knees) IMPLANT
IV LACTATED RINGER IRRG 3000ML (IV SOLUTION) ×2
IV LR IRRIG 3000ML ARTHROMATIC (IV SOLUTION) ×8 IMPLANT
KIT ACCUFILL 5CC (Knees) ×1 IMPLANT
KIT KNEE SCP 414.502 (Knees) ×2 IMPLANT
KIT TURNOVER KIT A (KITS) ×3 IMPLANT
MANIFOLD NEPTUNE II (INSTRUMENTS) ×6 IMPLANT
NEEDLE HYPO 22GX1.5 SAFETY (NEEDLE) ×3 IMPLANT
PACK ARTHROSCOPY KNEE (MISCELLANEOUS) ×3 IMPLANT
SPONGE T-LAP 18X18 ~~LOC~~+RFID (SPONGE) ×3 IMPLANT
SUT ETHILON 4-0 (SUTURE) ×2
SUT ETHILON 4-0 FS2 18XMFL BLK (SUTURE) ×1
SUTURE ETHLN 4-0 FS2 18XMF BLK (SUTURE) ×2 IMPLANT
TUBING INFLOW SET DBFLO PUMP (TUBING) ×3 IMPLANT
TUBING OUTFLOW SET DBLFO PUMP (TUBING) ×3 IMPLANT
WAND COBLATION FLOW 50 (SURGICAL WAND) ×3 IMPLANT
WATER STERILE IRR 500ML POUR (IV SOLUTION) ×3 IMPLANT

## 2021-09-27 NOTE — H&P (Signed)
Chief Complaint: No chief complaint on file.  Jamie Bennett is a 61 y.o. female who presents today for history and physical for left knee subchondroplasty procedure with Dr. Hessie Knows on 09/27/2021. Patient had a fall back in August 2022 and developed left medial knee pain. She has some underlying osteoarthritis that has not responded well to cortisone and viscosupplementation which typically gives her some relief. A recent MRI obtained showed insufficiency fracture to the medial tibial plateau. Mild cartilage thinning in the medial compartment with no medial or lateral meniscus tears. Patient has been on Prolia, calcium and vitamin D. She has been avoiding weightbearing with walker and continues to have pain along the medial tibial plateau with weightbearing activity. Pain is moderate to severe and has not improved over the last few months. No back pain numbness tingling or radicular symptoms.  Patient has morbid obesity. She states she had successful weight loss surgery years ago, lost over 100 pounds but due to depression she has gained some weight back.  Past Medical History: Past Medical History:  Diagnosis Date   Abdominal pain   Alkaline phosphatase elevation 02/15/2015   Bipolar affective disorder (CMS-HCC) 11/24/2013   Degenerative joint disease of pelvic region 03/02/2014   Depression   Heart murmur, unspecified   Hepatic fibrosis 02/16/2015   Hepatic steatosis 02/16/2015   History of Roux-en-Y gastric bypass 11/24/2013   Hypothyroidism 03/02/2014   Kidney stones   Liver disease   Low bone density   Morbid obesity (CMS-HCC)   Osteoporosis 06/16/2014   Rheumatic fever without heart involvement 03/02/2014   Rheumatoid arthritis (CMS-HCC) 11/24/2013   Varicose veins   Past Surgical History: Past Surgical History:  Procedure Laterality Date   ROUX-EN-Y PROCEDURE 2003   CHOLECYSTECTOMY 2004   EXPLORATORY LAPAROTOMY 2007   COLONOSCOPY 06/22/2008  Int Hemorrhoids   COLONOSCOPY  04/13/2011  Incomplete d/t Redundant Colon: CBF 03/2016   COLONOSCOPY 07/16/2017  Int Hemorrhoids: CBF 07/2027   EGD 08/30/2010, 10/08/2008, 06/22/2008   ERCP 11/23/2010, 09/21/2010  [facility]   Exicision of ganglion cyst  from the right index finger DIP joint.   Heel spur removal, bliateral feet   TONSILLECTOMY AND ADENOIDECTOMY   Trigger nodules of the finger   Wisdom teeth removal   Past Family History: Family History  Problem Relation Age of Onset   Atrial fibrillation (Abnormal heart rhythm sometimes requiring treatment with blood thinners) Mother   High blood pressure (Hypertension) Mother   Diabetes Father   High blood pressure (Hypertension) Father   Diabetes Brother   Medications: Current Outpatient Medications Ordered in Epic  Medication Sig Dispense Refill   albuterol 90 mcg/actuation inhaler INHALE 2 PUFFS BY MOUTH EVERY 6 HOURS AS NEEDED FOR WHEEZING AND FOR SHORTNESS OF BREATH   albuterol 90 mcg/actuation inhaler Inhale into the lungs (Patient not taking: Reported on 08/22/2021)   amoxicillin-clavulanate (AUGMENTIN) 875-125 mg tablet Take 1 tablet by mouth 2 (two) times daily (Patient not taking: Reported on 08/22/2021)   ascorbic acid, vitamin C, 500 mg Chew Take by mouth   aspirin 81 MG EC tablet Take 81 mg by mouth once daily   busPIRone (BUSPAR) 10 MG tablet Take by mouth.   cholecalciferol, vitamin D3, 100 mcg (4,000 unit) Cap Take by mouth once daily.   clonazePAM (KLONOPIN) 0.5 MG tablet Take 0.5 mg by mouth 2 (two) times daily as needed for Anxiety   cyanocobalamin (VITAMIN B12) 1,000 mcg/mL injection Inject into the muscle monthly.   desipramine (NORPRAMIN) 25 MG tablet  desipramine (NORPRAMIN) 25 MG tablet Take 1 tablet by mouth once daily   docusate calcium (SURFAK) 240 mg capsule Take 240 mg by mouth 2 (two) times daily.   esomeprazole (NEXIUM) 40 MG DR capsule 2 cap by mouth daily   gabapentin (NEURONTIN) 100 MG capsule Take 100 mg by mouth 3 (three) times  daily   HYDROcodone-acetaminophen (NORCO) 5-325 mg tablet Take one tablet at night for pain; may take up to every 6 hours as needed for pain if not working or driving (Patient not taking: Reported on 01/17/2021) 20 tablet 0   hydroxychloroquine (PLAQUENIL) 200 mg tablet Take 400 mg by mouth 2 (two) times daily.   hydrOXYzine (ATARAX) 50 MG tablet TAKE 1 TABLET BY MOUTH THREE TIMES DAILY AND 1 AS NEEDED ONCE DAILY   hydrOXYzine pamoate (VISTARIL) 25 MG capsule Take 50 mg by mouth once daily as needed for Anxiety   ibuprofen (ADVIL,MOTRIN) 600 MG tablet Take by mouth.   INULIN (FIBER GUMMIES ORAL) Take by mouth. (Patient not taking: Reported on 01/17/2021)   ketoconazole (NIZORAL) 2 % cream APPLY TO THE AFFECTED AREA DAILY FOR SIX WEEKS OR UNTIL RASH RESOLVES   KRILL OIL ORAL Take by mouth.   lamoTRIgine (LAMICTAL) 150 MG tablet Take 150 mg by mouth 2 (two) times daily.   levothyroxine (SYNTHROID, LEVOTHROID) 112 MCG tablet   loratadine (CLARITIN) 10 mg tablet 10 mg prn as needed   magnesium oxide 400 mg Take by mouth.   multivitamin capsule 1 cap by mouth daily   nitroGLYcerin (NITROSTAT) 0.4 MG SL tablet Place 0.4 mg under the tongue every 5 (five) minutes as needed DO NOT EXCEED A TOTAL OF 3 DOSES IN 15 MINUTES   psyllium, aspartame, (METAMUCIL) 3.4 gram packet Take by mouth   terbinafine HCL (LAMISIL) 250 mg tablet TAKE 1 TABLET BY MOUTH ONCE DAILY FOR 12 WEEKS FOR TOENAIL FUNGAL INFECTION   tiZANidine (ZANAFLEX) 4 MG capsule Take by mouth.   traZODone (DESYREL) 50 MG tablet Take 50 mg by mouth nightly.   venlafaxine (EFFEXOR-XR) 75 MG XR capsule Take 75 mg by mouth once daily.   zinc gluconate 50 mg tablet Take 1 tablet (50 mg total) by mouth once daily   No current Epic-ordered facility-administered medications on file.   Allergies: Allergies  Allergen Reactions   Lactulose Other (See Comments)   Sulfa (Sulfonamide Antibiotics) Hives    Review of Systems:  A comprehensive 14 point  ROS was performed, reviewed by me today, and the pertinent orthopaedic findings are documented in the HPI.  Exam: There were no vitals taken for this visit. General:  Well developed, well nourished, no apparent distress, normal affect, slow antalgic gait.  HEENT: Head normocephalic, atraumatic, PERRL.   Abdomen: Soft, non tender, non distended, Bowel sounds present.  Heart: Examination of the heart reveals regular, rate, and rhythm. There is no murmur noted on ascultation. There is a normal apical pulse.  Lungs: Lungs are clear to auscultation. There is no wheeze, rhonchi, or crackles. There is normal expansion of bilateral chest walls.   Left knee: Patient ambulatory with a walker. Antalgic gait. Left lower extremity shows morbid obesity, unable to determine if she has an effusion but does have 0 degrees extension and 95 degrees of flexion of the left knee. Tender just below the medial joint line along the proximal tibia. Mild guarding with palpation to this area along the medial proximal tibia. Patella tracks well in the trochlear groove. No grinding or crepitus. Hip moves  well with internal ex rotation  AP lateral sunrise views of the left knee are ordered and interpreted by me in the office today. Impression: Patient has 50% loss of joint space of the medial compartment with sclerotic changes along the medial tibial plateau. No evidence of acute bony abnormality or noticeable effusion. She has moderate to severe patellofemoral arthritic changes with slight lateral tracking of the patella in the trochlear groove.  EXAM:  MRI OF THE LEFT KNEE WITHOUT CONTRAST   TECHNIQUE:  Multiplanar, multisequence MR imaging of the knee was performed. No  intravenous contrast was administered.   COMPARISON:  Radiographs 04/04/2021   FINDINGS:  Intra-articular detail is mildly limited by body habitus and mild  motion artifact.   MENISCI   Medial meniscus: Degenerative tearing of the meniscal  body, best  seen on the coronal images. The meniscal root is intact. No  centrally displaced meniscal fragment.   Lateral meniscus:  Intact with normal morphology.   LIGAMENTS   Cruciates:  Intact.   Collaterals:  Intact.  Mild MCL degeneration and medial buckling.   CARTILAGE   Patellofemoral: Mild patellar chondral thinning and subchondral cyst  formation superiorly at apex. The trochlear cartilage appears  preserved.   Medial: Moderate chondral thinning, surface irregularity and  osteophyte formation.   Lateral: Mild chondral thinning and osteophyte formation. There is  subchondral cyst formation centrally in the lateral femoral condyle.   MISCELLANEOUS   Joint: Moderate-sized joint effusion with mild synovial  irregularity.   Popliteal Fossa: The popliteus muscle and tendon are intact. No  significant Baker's cyst.   Extensor Mechanism:  Intact.   Bones: Patchy marrow T2 hyperintensity within the medial tibial  metaphysis without extension to the articular surface or definite  corresponding abnormality on previous radiographs. There is  corresponding decreased T1 marrow signal, and this could reflect a  stress fracture.   Other: Generalized subcutaneous edema surrounding the knee without  focal fluid collection.   IMPRESSION:  1. Irregular degenerative tear of the body of the medial meniscus.  2. The lateral meniscus, cruciate and collateral ligaments are  intact.  3. Possible stress fracture of the medial tibial metaphysis.  4. Tricompartmental degenerative changes, greatest in the medial  compartment.   Impression: Insufficiency fracture of tibia with delayed healing, subsequent encounter [M84.469G] Insufficiency fracture of tibia with delayed healing, subsequent encounter (primary encounter diagnosis)  Plan:  69. 61 year old female with persistent left knee medial tibial plateau pain since a fall back in August 2022. No relief with conservative treatment,  activity modifications and MRI shows proximal medial tibial plateau insufficiency fracture. Risks, benefits, complications of a left knee subchondral plasty procedure have been discussed with the patient. Patient has agreed and consented the procedure with Dr. Hessie Knows on 09/27/2021.  This note was generated in part with voice recognition software and I apologize for any typographical errors that were not detected and corrected.  Nunzio Cobbs Gaines MPA-C    Reviewed  H+P. No changes noted.

## 2021-09-27 NOTE — Anesthesia Preprocedure Evaluation (Addendum)
Anesthesia Evaluation  Patient identified by MRN, date of birth, ID band Patient awake    Reviewed: Allergy & Precautions, NPO status , Patient's Chart, lab work & pertinent test results  History of Anesthesia Complications (+) PONV and history of anesthetic complications  Airway Mallampati: I   Neck ROM: Full    Dental   Missing upper molars x3:   Pulmonary neg pulmonary ROS,    Pulmonary exam normal breath sounds clear to auscultation       Cardiovascular negative cardio ROS Normal cardiovascular exam Rhythm:Regular Rate:Normal  ECG 09/09/21:  Normal sinus rhythm Left axis deviation Right bundle branch block   Neuro/Psych PSYCHIATRIC DISORDERS Anxiety Depression Bipolar Disorder negative neurological ROS     GI/Hepatic GERD  ,S/p gastric bypass   Endo/Other  Hypothyroidism Class 3 obesity  Renal/GU negative Renal ROS     Musculoskeletal  (+) Arthritis , Osteoarthritis and Rheumatoid disorders,  Fibromyalgia -  Abdominal   Peds  Hematology  (+) Blood dyscrasia, anemia ,   Anesthesia Other Findings   Reproductive/Obstetrics                            Anesthesia Physical Anesthesia Plan  ASA: 3  Anesthesia Plan: General   Post-op Pain Management:    Induction: Intravenous  PONV Risk Score and Plan: 4 or greater and Ondansetron, Dexamethasone, Treatment may vary due to age or medical condition and Scopolamine patch - Pre-op  Airway Management Planned: Oral ETT  Additional Equipment:   Intra-op Plan:   Post-operative Plan: Extubation in OR  Informed Consent: I have reviewed the patients History and Physical, chart, labs and discussed the procedure including the risks, benefits and alternatives for the proposed anesthesia with the patient or authorized representative who has indicated his/her understanding and acceptance.     Dental advisory given  Plan Discussed with:  CRNA  Anesthesia Plan Comments: (Patient consented for risks of anesthesia including but not limited to:  - adverse reactions to medications - damage to eyes, teeth, lips or other oral mucosa - nerve damage due to positioning  - sore throat or hoarseness - damage to heart, brain, nerves, lungs, other parts of body or loss of life  Informed patient about role of CRNA in peri- and intra-operative care.  Patient voiced understanding.    Patient is nervous about postoperative pain control; consented for postoperative adductor saphenous nerve block if needed.)       Anesthesia Quick Evaluation

## 2021-09-27 NOTE — Anesthesia Postprocedure Evaluation (Signed)
Anesthesia Post Note  Patient: Jamie Bennett  Procedure(s) Performed: Left knee subchondroplasty medial tibial plateau (Left: Knee)  Patient location during evaluation: PACU Anesthesia Type: General Level of consciousness: awake and alert, oriented and patient cooperative Pain management: pain level controlled Vital Signs Assessment: post-procedure vital signs reviewed and stable Respiratory status: spontaneous breathing, nonlabored ventilation and respiratory function stable Cardiovascular status: blood pressure returned to baseline and stable Postop Assessment: adequate PO intake Anesthetic complications: no   No notable events documented.   Last Vitals:  Vitals:   09/27/21 1415 09/27/21 1431  BP: 103/70   Pulse: 67 67  Resp: 17 15  Temp:  (!) 36.4 C  SpO2: 98% 93%    Last Pain:  Vitals:   09/27/21 1431  TempSrc:   PainSc: [place]

## 2021-09-27 NOTE — Op Note (Signed)
09/27/2021  2:11 PM  PATIENT:  Jamie Bennett  61 y.o. female  PRE-OPERATIVE DIAGNOSIS:  Insufficienct fracture of tibia with delayed healing, subsequent encounter  M84.469G Primary osteoarthritis of left knee M17.12  POST-OPERATIVE DIAGNOSIS:  Insufficienct fracture of tibia with delayed healing, subsequent encounter  M84.469G Primary osteoarthritis of left knee M17.12  PROCEDURE:  Procedure(s): Left knee subchondroplasty medial tibial plateau (Left)  SURGEON: Laurene Footman, MD  ASSISTANTS: None  ANESTHESIA:   general  EBL:  Total I/O In: 900 [I.V.:700; IV Piggyback:200] Out: -   BLOOD ADMINISTERED:none  DRAINS: none   LOCAL MEDICATIONS USED:  MARCAINE     SPECIMEN:  No Specimen  DISPOSITION OF SPECIMEN:  N/A  COUNTS:  YES  TOURNIQUET:  * Missing tourniquet times found for documented tourniquets in log: [id number] *  IMPLANTS: Bone ssubstitute from subchondroplasty kit  DICTATION: .Dragon Dictation patient was brought to the operating room and placed on the operative table.  After general anesthesia was obtained C-arm was brought in and good visualization of the knee was obtained in both AP and lateral projections with a bump underneath the left foot.  The leg was then prepped and draped in the usual sterile fashion with a tourniquet applied but not required during the procedure.  After patient identification and timeout procedures were completed a small incision was made after marking the skin to determine entry point for the subchondroplasty drill.  After this was determined in AP and lateral projections the drill was advanced to the appropriate position and the bone substitute mixed and infiltrated into the bone and was noted to track across the area of the stress fracture.  There did not appear to be extravasation.  After 10 minutes after this had set the arthroscope was introduced from an inferior lateral portal and there was moderate degenerative changes noted but no  extravasation of the bone material.  The arthroscope was withdrawn as well as the trocar 30 cc of half percent Sensorcaine infiltrated the area of the planned incisions and the arthroscopy portal was closed with a single interrupted 4-0 nylon skin suture.  Permanent C arm views were obtained.  Dressing applied of Xeroform 4 x 4 web roll and Ace wrap.  PLAN OF CARE: Discharge to home after PACU  PATIENT DISPOSITION:  PACU - hemodynamically stable.

## 2021-09-27 NOTE — Transfer of Care (Signed)
Immediate Anesthesia Transfer of Care Note  Patient: Jamie Bennett  Procedure(s) Performed: Left knee subchondroplasty medial tibial plateau (Left: Knee)  Patient Location: PACU  Anesthesia Type:General  Level of Consciousness: drowsy  Airway & Oxygen Therapy: Patient Spontanous Breathing and Patient connected to face mask oxygen  Post-op Assessment: Report given to RN and Post -op Vital signs reviewed and stable  Post vital signs: Reviewed and stable  Last Vitals:  Vitals Value Taken Time  BP 111/56 09/27/21 1401  Temp 36.1   Pulse 67 09/27/21 1409  Resp 21 09/27/21 1409  SpO2 100 % 09/27/21 1409  Vitals shown include unvalidated device data.  Last Pain:  Vitals:   09/27/21 1107  TempSrc: Temporal  PainSc: 4       Patients Stated Pain Goal: 0 (10/12/92 5859)  Complications: No notable events documented.

## 2021-09-27 NOTE — Discharge Instructions (Addendum)
AMBULATORY SURGERY  DISCHARGE INSTRUCTIONS   The drugs that you were given will stay in your system until tomorrow so for the next 24 hours you should not:  Drive an automobile Make any legal decisions Drink any alcoholic beverage   You may resume regular meals tomorrow.  Today it is better to start with liquids and gradually work up to solid foods.  You may eat anything you prefer, but it is better to start with liquids, then soup and crackers, and gradually work up to solid foods.   Please notify your doctor immediately if you have any unusual bleeding, trouble breathing, redness and pain at the surgery site, drainage, fever, or pain not relieved by medication.    Your post-operative visit with Dr.                                       is: Date:                        Time:    Please call to schedule your post-operative visit.  Additional Instructions:  \Keep dressing clean and dry Pain medicine as directed If bandage slides down leg applied Band-Aids to 2 incisions and keep clean and dry Call office if you are having problems. Okay to bear as much weight as you feel comfortable on your leg but try to minimize walking through the weekend.

## 2021-09-27 NOTE — Anesthesia Procedure Notes (Signed)
Procedure Name: Intubation Date/Time: 09/27/2021 1:12 PM Performed by: Cammie Sickle, CRNA Pre-anesthesia Checklist: Patient identified, Patient being monitored, Timeout performed, Emergency Drugs available and Suction available Patient Re-evaluated:Patient Re-evaluated prior to induction Oxygen Delivery Method: Circle system utilized Preoxygenation: Pre-oxygenation with 100% oxygen Induction Type: IV induction Ventilation: Mask ventilation without difficulty Laryngoscope Size: 3 and McGraph Grade View: Grade I Tube type: Oral Tube size: 7.0 mm Number of attempts: 1 Airway Equipment and Method: Stylet Placement Confirmation: ETT inserted through vocal cords under direct vision, positive ETCO2 and breath sounds checked- equal and bilateral Secured at: 21 cm Tube secured with: Tape Dental Injury: Teeth and Oropharynx as per pre-operative assessment

## 2021-09-28 NOTE — Progress Notes (Signed)
Upon making post op follow up appointment, Ms Mcdanel notified me that part a molar on a back tooth was broken off when she arrived home. She didn't notify anyone before discharge, as she states she didn't realize it until she got home. Notified Dr Wynetta Emery with anesthesia.

## 2021-09-29 NOTE — Progress Notes (Signed)
Patient was given aprepitant for history of PONV

## 2021-10-04 ENCOUNTER — Ambulatory Visit (HOSPITAL_BASED_OUTPATIENT_CLINIC_OR_DEPARTMENT_OTHER): Payer: Medicare HMO | Admitting: Psychiatry

## 2021-10-04 ENCOUNTER — Other Ambulatory Visit: Payer: Self-pay

## 2021-10-04 ENCOUNTER — Other Ambulatory Visit: Payer: Self-pay | Admitting: *Deleted

## 2021-10-04 DIAGNOSIS — F325 Major depressive disorder, single episode, in full remission: Secondary | ICD-10-CM

## 2021-10-04 DIAGNOSIS — E538 Deficiency of other specified B group vitamins: Secondary | ICD-10-CM

## 2021-10-04 DIAGNOSIS — D509 Iron deficiency anemia, unspecified: Secondary | ICD-10-CM

## 2021-10-04 DIAGNOSIS — R69 Illness, unspecified: Secondary | ICD-10-CM | POA: Diagnosis not present

## 2021-10-04 NOTE — Progress Notes (Signed)
`  Psychiatric Initial Adult Assessment   Patient Identification: Jamie Bennett MRN:  [id number] Date of Evaluation:  10/04/2021 Referral Source: grams per previous psychiatrist Chief Complaint:   Visit Diagnosis: borderline personality disorder No diagnosis found.   History of Pre   Today the patient is not doing great.  [name] died as expected but her mother also died suddenly.  The patient says that stressed her out a lot.  Overall though her mood does not seem to be all that bad.  She seems to be handling it pretty well.  She also had knee surgery as well.  Patient takes her medicines as prescribed.  She actually is sleeping and eating pretty well and she got good energy.  The patient is yet to get her therapist.  The patient drinks no alcohol and uses no drugs.  She has no psychotic symptoms.  She does not eating fairly well.  Depression Symptoms:  fatigue, (Hypo) Manic Symptoms:   Anxiety Symptoms:   Psychotic Symptoms:   PTSD Symptoms:   Past Psychiatric History: 10 psychiatric hospitalizations multiple psychotropic medications presently in psychotherapy  Previous Psychotropic Medications: Yes   Substance Abuse History in the last 12 months:  Yes.    Consequences of Substance Abuse:   Past Medical History:  Past Medical History:  Diagnosis Date   Anemia    Anxiety    Bipolar 1 disorder ([place])    Collagen vascular disease ([place])    rhematoid arthritis   Constipation    Dyspnea    Fibromyalgia    GERD (gastroesophageal reflux disease)    Heart murmur    mild-asymptomatic   Hepatic steatosis    History of kidney stones    History of Roux-en-Y gastric bypass    Hypotension    Hypothyroidism    Morbid obesity with BMI of 50.0-59.9, adult ([place])    Opioid abuse ([place])    Osteoarthritis    Osteoporosis    PONV (postoperative nausea and vomiting)    nausea only   Rheumatic fever    Rheumatoid arthritis ([place])    Small bowel obstruction ([place])     Thyroid disease     Past Surgical History:  Procedure Laterality Date   CHOLECYSTECTOMY  2004   COLONOSCOPY N/A 07/16/2017   Procedure: COLONOSCOPY;  Surgeon: Manya Silvas, MD;  Location: [facility] ENDOSCOPY;  Service: Endoscopy;  Laterality: N/A;   EXPLORATORY LAPAROTOMY  2009   GASTRIC BYPASS  2003   [facility]   HEEL SPUR EXCISION Bilateral    KNEE ARTHROSCOPY WITH SUBCHONDROPLASTY Left 09/27/2021   Procedure: Left knee subchondroplasty medial tibial plateau;  Surgeon: Hessie Knows, MD;  Location: [facility] ORS;  Service: Orthopedics;  Laterality: Left;   LAPAROTOMY N/A 03/01/2018   Procedure: EXPLORATORY LAPAROTOMY/ UMBILECTOMY;  Surgeon: Robert Bellow, MD;  Location: [facility] ORS;  Service: General;  Laterality: N/A;   TONSILLECTOMY     VENTRAL HERNIA REPAIR N/A 03/07/2018   Procedure: HERNIA REPAIR VENTRAL ADULT;  Surgeon: Robert Bellow, MD;  Location: [facility] ORS;  Service: General;  Laterality: N/A;    Family Psychiatric History:   Family History:  Family History  Problem Relation Age of Onset   Depression Mother    Dementia Mother    Heart disease Mother    Osteoporosis Mother    Parkinson's disease Father    Liver disease Brother    Colon cancer Neg Hx     Social History:   Social History   Socioeconomic History  Marital status: Married    Spouse name: Not on file   Number of children: Not on file   Years of education: Not on file   Highest education level: Not on file  Occupational History   Not on file  Tobacco Use   Smoking status: Never   Smokeless tobacco: Never  Vaping Use   Vaping Use: Never used  Substance and Sexual Activity   Alcohol use: Not Currently   Drug use: No   Sexual activity: Yes  Other Topics Concern   Not on file  Social History Narrative   Not on file   Social Determinants of Health   Financial Resource Strain: Not on file  Food Insecurity: Not on file  Transportation Needs: Not on file  Physical Activity: Not on  file  Stress: Not on file  Social Connections: Not on file    Additional Social History:   Allergies:   Allergies  Allergen Reactions   Lactose Intolerance (Gi) Other (See Comments)    Bloating and GI distress   Sulfa Antibiotics Hives    Metabolic Disorder Labs: Lab Results  Component Value Date   HGBA1C 5.0 09/01/2021   No results found for: PROLACTIN Lab Results  Component Value Date   CHOL 169 05/26/2020   TRIG 136 05/26/2020   HDL 73 05/26/2020   LDLCALC 73 05/26/2020     Current Medications: Current Outpatient Medications  Medication Sig Dispense Refill   acetaminophen (TYLENOL) 500 MG tablet Take 500-1,000 mg by mouth every 6 (six) hours as needed (pain.).     albuterol (VENTOLIN HFA) 108 (90 Base) MCG/ACT inhaler Inhale 2 puffs into the lungs every 6 (six) hours as needed for wheezing or shortness of breath. 8 g 0   busPIRone (BUSPAR) 30 MG tablet 1 bid (Patient taking differently: Take 10 mg by mouth 3 (three) times daily. 1 bid) 180 tablet 2   Calcium Carbonate (CALCIUM 600 PO) Take 600 mg by mouth in the morning and at bedtime.     Cholecalciferol 125 MCG (5000 UT) TABS Take 5,000 Units by mouth in the morning.     clonazePAM (KLONOPIN) 0.5 MG tablet TAKE 1 TABLET BY MOUTH ONCE DAILY AS NEEDED FOR  ACUTE  ANXIETY  EMERGENCIES  ONLY  DO  NOT  EXCEED  ONE  PER  DAY 30 tablet 5   Coenzyme Q10 300 MG CAPS Take 300 mg by mouth in the morning.     cyanocobalamin (,VITAMIN B-12,) 1000 MCG/ML injection Inject 1,000 mcg into the muscle every 30 (thirty) days.      denosumab (PROLIA) 60 MG/ML SOSY injection Inject 60 mg into the skin every 6 (six) months.     esomeprazole (NEXIUM) 20 MG capsule Take 20 mg by mouth in the morning.     estradiol (ESTRACE) 0.1 MG/GM vaginal cream Apply a pea-sized amount to index finger and wipe in vaginal introitus twice weekly 42.5 g 12   gabapentin (NEURONTIN) 100 MG capsule TAKE 2 CAPSULES BY MOUTH THREE TIMES DAILY 180 capsule 0    HYDROcodone-acetaminophen (NORCO) 7.5-325 MG tablet Take 1 tablet by mouth every 6 (six) hours as needed for moderate pain. 30 tablet 0   hydroxychloroquine (PLAQUENIL) 200 MG tablet TAKE ONE TABLET BY MOUTH TWICE A DAY 180 tablet 0   hydrOXYzine (ATARAX) 50 MG tablet 1  Tid   1  Prn q day (Patient taking differently: Take 50 mg by mouth 3 (three) times daily as needed for anxiety.) 360 tablet 1  Krill Oil 500 MG CAPS Take 500 mg by mouth daily at 12 noon.     lamoTRIgine (LAMICTAL) 150 MG tablet Take 1 tablet (150 mg total) by mouth 2 (two) times daily. 68 tablet 0   levothyroxine (SYNTHROID) 112 MCG tablet Take 1 tablet (112 mcg total) by mouth daily. (Patient taking differently: Take 112 mcg by mouth daily before breakfast.) 90 tablet 1   loratadine (CLARITIN) 10 MG tablet Take 10 mg by mouth daily as needed for allergies.     magnesium oxide (MAG-OX) 400 MG tablet Take 400 mg by mouth every evening.     Multiple Vitamin (MULTIVITAMIN) tablet Take 1 tablet by mouth every evening.     nitrofurantoin (MACRODANTIN) 50 MG capsule Take 1 capsule (50 mg total) by mouth daily. 30 capsule 2   polyethylene glycol (MIRALAX / GLYCOLAX) 17 g packet Take 17 g by mouth in the morning and at bedtime.     psyllium (METAMUCIL) 58.6 % packet Take 1 packet by mouth in the morning.     sucralfate (CARAFATE) 1 g tablet TAKE ONE TABLET BY MOUTH TWICE A DAY 180 tablet 0   tiZANidine (ZANAFLEX) 4 MG tablet TAKE 1 TABLET BY MOUTH THREE TIMES DAILY 90 tablet 0   traZODone (DESYREL) 100 MG tablet Take 1 tablet (100 mg total) by mouth at bedtime as needed and may repeat dose one time if needed for sleep. 60 tablet 5   venlafaxine (EFFEXOR) 100 MG tablet Take 1 tablet (100 mg total) by mouth 3 (three) times daily with meals. 270 tablet 2   vitamin C (ASCORBIC ACID) 500 MG tablet Take 500 mg by mouth in the morning.     zinc gluconate 50 MG tablet Take 50 mg by mouth in the morning.     No current facility-administered  medications for this visit.    Neurologic: Headache: No Seizure: No Paresthesias:NA  Musculoskeletal: Strength & Muscle Tone: within normal limits Gait & Station: normal Patient leans: N/A  Psychiatric Specialty Exam: ROS  There were no vitals taken for this visit.There is no height or weight on file to calculate BMI.  General Appearance: Bizarre  Eye Contact:  Good  Speech:  Normal Rate  Volume:  Normal  Mood:  Anxious  Affect:  Congruent  Thought Process:  Goal Directed  Orientation:  NA  Thought Content:  Logical  Suicidal Thoughts:  No  Homicidal Thoughts:  No  Memory:  Negative  Judgement:  Fair  Insight:  NA and Good  Psychomotor Activity:  Normal  Concentration:    Recall:  Good  Fund of Knowledge:  Language: Good  Akathisia:  No  Handed:  Right  AIMS (if indicated):    Assets:  Desire for Improvement  ADL's:  Intact  Cognition: WNL  Sleep:      Treatment Plan Summary:  Today we decided that we would not change any of the medication she is taking.  In fact she says she does not need any refills at this time.  To rather have the pharmacy call us for the refills.  She will continue taking all the medications prescribed including the BuSpar and Vistaril.  The patient was given the name of somebody who can do DBT treatment.  She has other names as well.  She is very aware that she needs to be in therapy at this time.  She will continue all the medications prescribed.  She will return to see me in 2 to 3 months.  Patient  is functioning reasonably well. 3/7/20234:48 PM

## 2021-10-06 NOTE — Progress Notes (Unsigned)
Office Visit Note  Patient: Jamie Bennett             Date of Birth: 1961/02/24           MRN: [id number]             PCP: Mylinda Latina, PA-C Referring: Lavera Guise, MD Visit Date: 10/20/2021 Occupation: '@GUAROCC'$ @  Subjective:  No chief complaint on file.   History of Present Illness: Jamie Bennett is a 61 y.o. female ***   Activities of Daily Living:  Patient reports morning stiffness for *** {minute/hour:19697}.   Patient {ACTIONS;DENIES/REPORTS:21021675::"Denies"} nocturnal pain.  Difficulty dressing/grooming: {ACTIONS;DENIES/REPORTS:21021675::"Denies"} Difficulty climbing stairs: {ACTIONS;DENIES/REPORTS:21021675::"Denies"} Difficulty getting out of chair: {ACTIONS;DENIES/REPORTS:21021675::"Denies"} Difficulty using hands for taps, buttons, cutlery, and/or writing: {ACTIONS;DENIES/REPORTS:21021675::"Denies"}  No Rheumatology ROS completed.   PMFS History:  Patient Active Problem List   Diagnosis Date Noted   Neck pain, musculoskeletal 03/24/2020   Bipolar affective disorder, current episode mixed ([place]) 11/09/2019   Encounter for general adult medical examination with abnormal findings 06/15/2019   Closed fracture of left elbow 06/15/2019   Urinary tract infection without hematuria 06/15/2019   Encounter for long-term (current) use of medications 06/15/2019   Acute upper respiratory infection 06/06/2019   Exposure to COVID-19 virus 06/06/2019   Exercise-induced asthma 12/15/2018   Screening for breast cancer 05/29/2018   Duodenal ulcer disease 05/29/2018   Screening for malignant neoplasm of cervix 05/29/2018   Dysuria 05/29/2018   Surgical wound dehiscence, initial encounter 04/14/2018   Postoperative abdominal hernia with obstruction    Incarcerated ventral hernia 03/22/2018   SBO (small bowel obstruction) ([place]) 03/07/2018   Abscess of abdominal wall 03/01/2018   Persistent umbilical sinus 40/98/1191   Ringworm of body 01/23/2018   Atopic  dermatitis 12/05/2017   Adjustment disorder with mixed anxiety and depressed mood 03/10/2017   Major depressive disorder 03/10/2017   Primary osteoarthritis of both knees 01/19/2017   Suicide attempt ([place]) 07/10/2016   Fibromyalgia 07/10/2016   High risk medication use 07/10/2016   AKI (acute kidney injury) ([place]) 11/09/2015   Elevated troponin 11/09/2015   Hypotension 11/09/2015   Respiratory failure (HCC)    Acute hepatic failure 11/08/2015   Drug overdose 11/08/2015   Fatty infiltration of liver 02/16/2015   Hepatic fibrosis 02/16/2015   Abnormal serum level of alkaline phosphatase 02/15/2015   Iron deficiency anemia 02/05/2015   Vitamin B 12 deficiency 02/05/2015   OP (osteoporosis) 06/16/2014   Rheumatoid arteritis ([place]) 03/02/2014   Hypothyroidism 03/02/2014   Rheumatic fever without heart involvement 03/02/2014   Adult hypothyroidism 03/02/2014   Arthritis of pelvic region, degenerative 03/02/2014   Bipolar 1 disorder, depressed ([place]) 02/26/2014   Bariatric surgery status 11/24/2013   Affective bipolar disorder ([place]) 11/24/2013   Bipolar affective disorder ([place]) 11/24/2013   Rheumatoid arthritis ([place]) 11/24/2013   Polysubstance (excluding opioids) dependence ([place]) 09/11/2013   Polysubstance dependence ([place]) 09/11/2013   Combined drug dependence excluding opioids ([place]) 09/11/2013   Arthritis or polyarthritis, rheumatoid ([place]) 09/05/2013   Leg weakness 09/05/2013    Past Medical History:  Diagnosis Date   Anemia    Anxiety    Bipolar 1 disorder (HCC)    Collagen vascular disease ([place])    rhematoid arthritis   Constipation    Dyspnea    Fibromyalgia    GERD (gastroesophageal reflux disease)    Heart murmur    mild-asymptomatic   Hepatic steatosis    History of kidney stones    History of Roux-en-Y gastric  bypass    Hypotension    Hypothyroidism    Morbid obesity with BMI of 50.0-59.9, adult (HCC)    Opioid abuse (HCC)    Osteoarthritis    Osteoporosis     PONV (postoperative nausea and vomiting)    nausea only   Rheumatic fever    Rheumatoid arthritis (HCC)    Small bowel obstruction (HCC)    Thyroid disease     Family History  Problem Relation Age of Onset   Depression Mother    Dementia Mother    Heart disease Mother    Osteoporosis Mother    Parkinson's disease Father    Liver disease Brother    Colon cancer Neg Hx    Past Surgical History:  Procedure Laterality Date   CHOLECYSTECTOMY  2004   COLONOSCOPY N/A 07/16/2017   Procedure: COLONOSCOPY;  Surgeon: Manya Silvas, MD;  [facility] END[facility]COPY;  Service: Endoscopy;  Laterality: N/A;   EXPLORATORY LAPAROTOMY  2009   GASTRIC BYPASS  2003   [facility]   HEEL SPUR EXCISION Bilateral    KNEE ARTHROSCOPY WITH SUBCHONDROPLASTY Left 09/27/2021   Procedure: Left knee subchondroplasty medial tibial plateau;  Surgeon: Hessie Knows, MD;  Location: [facility] ORS;  Service: Orthopedics;  Laterality: Left;   LAPAROTOMY N/A 03/01/2018   Procedure: EXPLORATORY LAPAROTOMY/ UMBILECTOMY;  Surgeon: Robert Bellow, MD;  Location: [facility] ORS;  Service: General;  Laterality: N/A;   TONSILLECTOMY     VENTRAL HERNIA REPAIR N/A 03/07/2018   Procedure: HERNIA REPAIR VENTRAL ADULT;  Surgeon: Robert Bellow, MD;  Location: [facility] ORS;  Service: General;  Laterality: N/A;   Social History   Social History Narrative   Not on file   Immunization History  Administered Date(s) Administered   Influenza, Quadrivalent, Recombinant, Inj, Pf 04/26/2019   Influenza,inj,Quad PF,6+ Mos 04/24/2016   Influenza-Unspecified 04/19/2021   Moderna SARS-COV2 Booster Vaccination 01/30/2021, 05/13/2021   Moderna Sars-Covid-2 Vaccination 10/25/2019, 11/22/2019, 06/02/2020   Pneumococcal Polysaccharide-23 03/02/2018     Objective: Vital Signs: There were no vitals taken for this visit.   Physical Exam   Musculoskeletal Exam: ***  CDAI Exam: CDAI Score: -- Patient Global: --; Provider Global:  -- Swollen: --; Tender: -- Joint Exam 10/20/2021   No joint exam has been documented for this visit   There is currently no information documented on the homunculus. Go to the Rheumatology activity and complete the homunculus joint exam.  Investigation: No additional findings.  Imaging: DG Knee 1-2 Views Left  Result Date: 09/27/2021 CLINICAL DATA:  Subchondroplasty medial tibial plateau. EXAM: LEFT KNEE - 1-2 VIEW COMPARISON:  MRI 06/22/2021 FINDINGS: Surgical changes with bone cement in the tibial metaphysis. IMPRESSION: Surgical changes but no complicating features. Electronically Signed   By: Marijo Sanes M.D.   On: 09/27/2021 14:11   DG C-Arm 1-60 Min-No Report  Result Date: 09/27/2021 Fluoroscopy was utilized by the requesting physician.  No radiographic interpretation.    Recent Labs: Lab Results  Component Value Date   WBC 4.5 07/12/2021   HGB 12.2 07/12/2021   PLT 218 07/12/2021   NA 141 09/09/2021   K 3.6 09/09/2021   CL 113 (H) 09/09/2021   CO2 20 (L) 09/09/2021   GLUCOSE 93 09/09/2021   BUN 14 09/09/2021   CREATININE 0.89 09/09/2021   BILITOT 0.2 (L) 09/09/2021   ALKPHOS 90 09/09/2021   AST 22 09/09/2021   ALT 14 09/09/2021   PROT 6.8 09/09/2021   ALBUMIN 3.8 09/09/2021   CALCIUM 8.4 (  L) 09/09/2021   GFRAA 99 05/26/2020    Speciality Comments: PLQ Eye Exam: 03/24/2021 WNL @ [place] follow up in 6 months.  Procedures:  No procedures performed Allergies: Lactose intolerance (gi) and Sulfa antibiotics   Assessment / Plan:     Visit Diagnoses: Rheumatoid arthritis involving multiple sites with positive rheumatoid factor (HCC)  High risk medication use  Primary osteoarthritis of both knees  DDD (degenerative disc disease), cervical  DDD (degenerative disc disease), thoracic  DDD (degenerative disc disease), lumbar  Other osteoporosis without current pathological fracture  Fibromyalgia  Trapezius muscle spasm  Trochanteric bursitis  of left hip  History of gastroesophageal reflux (GERD)  History of hypothyroidism  History of anemia  History of bipolar disorder  Orders: No orders of the defined types were placed in this encounter.  No orders of the defined types were placed in this encounter.   Face-to-face time spent with patient was *** minutes. Greater than 50% of time was spent in counseling and coordination of care.  Follow-Up Instructions: No follow-ups on file.   Ofilia Neas, PA-C  Note - This record has been created using Dragon software.  Chart creation errors have been sought, but may not always  have been located. Such creation errors do not reflect on  the standard of medical care.,

## 2021-10-09 ENCOUNTER — Other Ambulatory Visit: Payer: Self-pay | Admitting: Internal Medicine

## 2021-10-11 ENCOUNTER — Inpatient Hospital Stay: Payer: Medicare HMO | Admitting: Oncology

## 2021-10-11 ENCOUNTER — Other Ambulatory Visit: Payer: Self-pay

## 2021-10-11 ENCOUNTER — Inpatient Hospital Stay: Payer: Medicare HMO | Attending: Oncology

## 2021-10-11 DIAGNOSIS — D509 Iron deficiency anemia, unspecified: Secondary | ICD-10-CM

## 2021-10-11 DIAGNOSIS — E538 Deficiency of other specified B group vitamins: Secondary | ICD-10-CM

## 2021-10-11 DIAGNOSIS — D519 Vitamin B12 deficiency anemia, unspecified: Secondary | ICD-10-CM | POA: Insufficient documentation

## 2021-10-11 LAB — CBC WITH DIFFERENTIAL/PLATELET
Abs Immature Granulocytes: 0.02 10*3/uL (ref 0.00–0.07)
Basophils Absolute: 0 10*3/uL (ref 0.0–0.1)
Basophils Relative: 1 %
Eosinophils Absolute: 0.3 10*3/uL (ref 0.0–0.5)
Eosinophils Relative: 5 %
HCT: 40.8 % (ref 36.0–46.0)
Hemoglobin: 12.7 g/dL (ref 12.0–15.0)
Immature Granulocytes: 0 %
Lymphocytes Relative: 42 %
Lymphs Abs: 2.3 10*3/uL (ref 0.7–4.0)
MCH: 28.6 pg (ref 26.0–34.0)
MCHC: 31.1 g/dL (ref 30.0–36.0)
MCV: 91.9 fL (ref 80.0–100.0)
Monocytes Absolute: 0.5 10*3/uL (ref 0.1–1.0)
Monocytes Relative: 8 %
Neutro Abs: 2.5 10*3/uL (ref 1.7–7.7)
Neutrophils Relative %: 44 %
Platelets: 239 10*3/uL (ref 150–400)
RBC: 4.44 MIL/uL (ref 3.87–5.11)
RDW: 13.3 % (ref 11.5–15.5)
WBC: 5.6 10*3/uL (ref 4.0–10.5)
nRBC: 0 % (ref 0.0–0.2)

## 2021-10-11 LAB — IRON AND TIBC
Iron: 89 ug/dL (ref 28–170)
Saturation Ratios: 20 % (ref 10.4–31.8)
TIBC: 451 ug/dL — ABNORMAL HIGH (ref 250–450)
UIBC: 362 ug/dL

## 2021-10-11 LAB — VITAMIN B12: Vitamin B-12: 844 pg/mL (ref 180–914)

## 2021-10-11 LAB — FERRITIN: Ferritin: 53 ng/mL (ref 11–307)

## 2021-10-11 LAB — FOLATE: Folate: 26 ng/mL (ref >5.9)

## 2021-10-11 MED ORDER — CYANOCOBALAMIN 1000 MCG/ML IJ SOLN
1000.0000 ug | Freq: Once | INTRAMUSCULAR | Status: AC
  Administered 2021-10-11: 1000 ug via INTRAMUSCULAR
  Filled 2021-10-11: qty 1

## 2021-10-13 NOTE — Progress Notes (Signed)
No iron. Looks pretty good.  ? ?Jamie Bennett  ?

## 2021-10-14 NOTE — Progress Notes (Signed)
Called pt and let her know that she did not need any iron. The numbers were good. She was happy with results ?

## 2021-10-20 ENCOUNTER — Ambulatory Visit (INDEPENDENT_AMBULATORY_CARE_PROVIDER_SITE_OTHER): Payer: Medicare HMO

## 2021-10-20 ENCOUNTER — Other Ambulatory Visit: Payer: Self-pay

## 2021-10-20 ENCOUNTER — Encounter: Payer: Self-pay | Admitting: Physician Assistant

## 2021-10-20 ENCOUNTER — Ambulatory Visit: Payer: Medicare HMO | Admitting: Physician Assistant

## 2021-10-20 VITALS — BP 119/75 | HR 69 | Ht 65.0 in | Wt 346.0 lb

## 2021-10-20 DIAGNOSIS — M797 Fibromyalgia: Secondary | ICD-10-CM

## 2021-10-20 DIAGNOSIS — Z862 Personal history of diseases of the blood and blood-forming organs and certain disorders involving the immune mechanism: Secondary | ICD-10-CM

## 2021-10-20 DIAGNOSIS — M5030 Other cervical disc degeneration, unspecified cervical region: Secondary | ICD-10-CM

## 2021-10-20 DIAGNOSIS — M62838 Other muscle spasm: Secondary | ICD-10-CM | POA: Diagnosis not present

## 2021-10-20 DIAGNOSIS — M5134 Other intervertebral disc degeneration, thoracic region: Secondary | ICD-10-CM | POA: Diagnosis not present

## 2021-10-20 DIAGNOSIS — M79674 Pain in right toe(s): Secondary | ICD-10-CM

## 2021-10-20 DIAGNOSIS — M0579 Rheumatoid arthritis with rheumatoid factor of multiple sites without organ or systems involvement: Secondary | ICD-10-CM

## 2021-10-20 DIAGNOSIS — M818 Other osteoporosis without current pathological fracture: Secondary | ICD-10-CM | POA: Diagnosis not present

## 2021-10-20 DIAGNOSIS — M79641 Pain in right hand: Secondary | ICD-10-CM | POA: Diagnosis not present

## 2021-10-20 DIAGNOSIS — Z8719 Personal history of other diseases of the digestive system: Secondary | ICD-10-CM

## 2021-10-20 DIAGNOSIS — Z79899 Other long term (current) drug therapy: Secondary | ICD-10-CM | POA: Diagnosis not present

## 2021-10-20 DIAGNOSIS — M7062 Trochanteric bursitis, left hip: Secondary | ICD-10-CM

## 2021-10-20 DIAGNOSIS — M79672 Pain in left foot: Secondary | ICD-10-CM

## 2021-10-20 DIAGNOSIS — M5136 Other intervertebral disc degeneration, lumbar region: Secondary | ICD-10-CM

## 2021-10-20 DIAGNOSIS — Z8639 Personal history of other endocrine, nutritional and metabolic disease: Secondary | ICD-10-CM

## 2021-10-20 DIAGNOSIS — Z8659 Personal history of other mental and behavioral disorders: Secondary | ICD-10-CM

## 2021-10-20 DIAGNOSIS — M170 Bilateral primary osteoarthritis of knee: Secondary | ICD-10-CM | POA: Diagnosis not present

## 2021-10-20 DIAGNOSIS — M79642 Pain in left hand: Secondary | ICD-10-CM | POA: Diagnosis not present

## 2021-10-20 DIAGNOSIS — M51369 Other intervertebral disc degeneration, lumbar region without mention of lumbar back pain or lower extremity pain: Secondary | ICD-10-CM

## 2021-10-20 NOTE — Patient Instructions (Signed)
Hand Exercises Hand exercises can be helpful for almost anyone. These exercises can strengthen the hands, improve flexibility and movement, and increase blood flow to the hands. These results can make work and daily tasks easier. Hand exercises can be especially helpful for people who have joint pain from arthritis or have nerve damage from overuse (carpal tunnel syndrome). These exercises can also help people who have injured a hand. Exercises Most of these hand exercises are gentle stretching and motion exercises. It is usually safe to do them often throughout the day. Warming up your hands before exercise may help to reduce stiffness. You can do this with gentle massage or by placing your hands in warm water for 10-15 minutes. It is normal to feel some stretching, pulling, tightness, or mild discomfort as you begin new exercises. This will gradually improve. Stop an exercise right away if you feel sudden, severe pain or your pain gets worse. Ask your health care provider which exercises are best for you. Knuckle bend or "claw" fist  Stand or sit with your arm, hand, and all five fingers pointed straight up. Make sure to keep your wrist straight during the exercise. Gently bend your fingers down toward your palm until the tips of your fingers are touching the top of your palm. Keep your big knuckle straight and just bend the small knuckles in your fingers. Hold this position for __________ seconds. Straighten (extend) your fingers back to the starting position. Repeat this exercise 5-10 times with each hand. Full finger fist  Stand or sit with your arm, hand, and all five fingers pointed straight up. Make sure to keep your wrist straight during the exercise. Gently bend your fingers into your palm until the tips of your fingers are touching the middle of your palm. Hold this position for __________ seconds. Extend your fingers back to the starting position, stretching every joint fully. Repeat  this exercise 5-10 times with each hand. Straight fist Stand or sit with your arm, hand, and all five fingers pointed straight up. Make sure to keep your wrist straight during the exercise. Gently bend your fingers at the big knuckle, where your fingers meet your hand, and the middle knuckle. Keep the knuckle at the tips of your fingers straight and try to touch the bottom of your palm. Hold this position for __________ seconds. Extend your fingers back to the starting position, stretching every joint fully. Repeat this exercise 5-10 times with each hand. Tabletop  Stand or sit with your arm, hand, and all five fingers pointed straight up. Make sure to keep your wrist straight during the exercise. Gently bend your fingers at the big knuckle, where your fingers meet your hand, as far down as you can while keeping the small knuckles in your fingers straight. Think of forming a tabletop with your fingers. Hold this position for __________ seconds. Extend your fingers back to the starting position, stretching every joint fully. Repeat this exercise 5-10 times with each hand. Finger spread  Place your hand flat on a table with your palm facing down. Make sure your wrist stays straight as you do this exercise. Spread your fingers and thumb apart from each other as far as you can until you feel a gentle stretch. Hold this position for __________ seconds. Bring your fingers and thumb tight together again. Hold this position for __________ seconds. Repeat this exercise 5-10 times with each hand. Making circles  Stand or sit with your arm, hand, and all five fingers pointed   straight up. Make sure to keep your wrist straight during the exercise. Make a circle by touching the tip of your thumb to the tip of your index finger. Hold for __________ seconds. Then open your hand wide. Repeat this motion with your thumb and each finger on your hand. Repeat this exercise 5-10 times with each hand. Thumb  motion  Sit with your forearm resting on a table and your wrist straight. Your thumb should be facing up toward the ceiling. Keep your fingers relaxed as you move your thumb. Lift your thumb up as high as you can toward the ceiling. Hold for __________ seconds. Bend your thumb across your palm as far as you can, reaching the tip of your thumb for the small finger (pinkie) side of your palm. Hold for __________ seconds. Repeat this exercise 5-10 times with each hand. Grip strengthening  Hold a stress ball or other soft ball in the middle of your hand. Slowly increase the pressure, squeezing the ball as much as you can without causing pain. Think of bringing the tips of your fingers into the middle of your palm. All of your finger joints should bend when doing this exercise. Hold your squeeze for __________ seconds, then relax. Repeat this exercise 5-10 times with each hand. Contact a health care provider if: Your hand pain or discomfort gets much worse when you do an exercise. Your hand pain or discomfort does not improve within 2 hours after you exercise. If you have any of these problems, stop doing these exercises right away. Do not do them again unless your health care provider says that you can. Get help right away if: You develop sudden, severe hand pain or swelling. If this happens, stop doing these exercises right away. Do not do them again unless your health care provider says that you can. This information is not intended to replace advice given to you by your health care provider. Make sure you discuss any questions you have with your health care provider. Document Revised: 11/04/2020 Document Reviewed: 11/04/2020 Elsevier Patient Education  2022 [employer]  

## 2021-10-21 NOTE — Progress Notes (Signed)
X-rays of both hands and feet are consistent with osteoarthritic changes.   ? ?I discussed the x-rays of the right foot. Please notify the patient.

## 2021-10-22 ENCOUNTER — Other Ambulatory Visit: Payer: Self-pay | Admitting: Physician Assistant

## 2021-10-22 DIAGNOSIS — M542 Cervicalgia: Secondary | ICD-10-CM

## 2021-10-22 DIAGNOSIS — G6189 Other inflammatory polyneuropathies: Secondary | ICD-10-CM

## 2021-10-26 DIAGNOSIS — Z1231 Encounter for screening mammogram for malignant neoplasm of breast: Secondary | ICD-10-CM | POA: Diagnosis not present

## 2021-11-02 ENCOUNTER — Other Ambulatory Visit: Payer: Self-pay | Admitting: Physician Assistant

## 2021-11-02 DIAGNOSIS — M0579 Rheumatoid arthritis with rheumatoid factor of multiple sites without organ or systems involvement: Secondary | ICD-10-CM

## 2021-11-02 NOTE — Telephone Encounter (Signed)
Next Visit: 03/22/2022 ? ?Last Visit: 10/20/2021 ? ?Labs: 10/11/2021, CBC, TIBC 451, 09/09/2021, CMP, Chloride 113, CO2 20, Calcium 8.4, Total Bilirubin 02,  ? ?Eye exam: 03/24/2021 WNL 6 month f/u,  I called patient, patient will call to schedule appt ? ?Current Dose per office note 10/20/2021: Plaquenil 200 mg 1 tablet by mouth twice daily.   ? ?IV:HOYWVXUCJA arthritis involving multiple sites with positive rheumatoid factor  ? ?Last Fill: 08/08/2021 ? ?Okay to refill Plaquenil? ? ?

## 2021-11-06 ENCOUNTER — Other Ambulatory Visit: Payer: Self-pay | Admitting: Physician Assistant

## 2021-11-08 ENCOUNTER — Inpatient Hospital Stay: Payer: Medicare HMO | Attending: Oncology

## 2021-11-08 DIAGNOSIS — E538 Deficiency of other specified B group vitamins: Secondary | ICD-10-CM | POA: Insufficient documentation

## 2021-11-08 DIAGNOSIS — Z79899 Other long term (current) drug therapy: Secondary | ICD-10-CM | POA: Insufficient documentation

## 2021-11-08 MED ORDER — CYANOCOBALAMIN 1000 MCG/ML IJ SOLN
1000.0000 ug | Freq: Once | INTRAMUSCULAR | Status: AC
  Administered 2021-11-08: 1000 ug via INTRAMUSCULAR
  Filled 2021-11-08: qty 1

## 2021-11-20 ENCOUNTER — Other Ambulatory Visit: Payer: Self-pay | Admitting: Physician Assistant

## 2021-11-20 DIAGNOSIS — M542 Cervicalgia: Secondary | ICD-10-CM

## 2021-11-20 DIAGNOSIS — G6189 Other inflammatory polyneuropathies: Secondary | ICD-10-CM

## 2021-11-28 DIAGNOSIS — M84469G Pathological fracture, unspecified tibia and fibula, subsequent encounter for fracture with delayed healing: Secondary | ICD-10-CM | POA: Diagnosis not present

## 2021-11-28 DIAGNOSIS — M1712 Unilateral primary osteoarthritis, left knee: Secondary | ICD-10-CM | POA: Diagnosis not present

## 2021-11-28 DIAGNOSIS — M84469A Pathological fracture, unspecified tibia and fibula, initial encounter for fracture: Secondary | ICD-10-CM | POA: Diagnosis not present

## 2021-12-05 ENCOUNTER — Other Ambulatory Visit: Payer: Self-pay | Admitting: Urology

## 2021-12-06 ENCOUNTER — Inpatient Hospital Stay: Payer: Medicare HMO | Attending: Oncology

## 2021-12-06 ENCOUNTER — Other Ambulatory Visit: Payer: Self-pay

## 2021-12-06 DIAGNOSIS — E538 Deficiency of other specified B group vitamins: Secondary | ICD-10-CM | POA: Insufficient documentation

## 2021-12-06 MED ORDER — CYANOCOBALAMIN 1000 MCG/ML IJ SOLN
1000.0000 ug | Freq: Once | INTRAMUSCULAR | Status: AC
  Administered 2021-12-06: 1000 ug via INTRAMUSCULAR
  Filled 2021-12-06: qty 1

## 2021-12-07 ENCOUNTER — Other Ambulatory Visit: Payer: Self-pay | Admitting: Urology

## 2021-12-07 ENCOUNTER — Other Ambulatory Visit: Payer: Self-pay

## 2021-12-07 DIAGNOSIS — K269 Duodenal ulcer, unspecified as acute or chronic, without hemorrhage or perforation: Secondary | ICD-10-CM

## 2021-12-07 MED ORDER — SUCRALFATE 1 G PO TABS: 1.0000 g | ORAL_TABLET | Freq: Two times a day (BID) | ORAL | 1 refills | Status: DC

## 2021-12-08 ENCOUNTER — Encounter: Payer: Self-pay | Admitting: Urology

## 2021-12-08 ENCOUNTER — Ambulatory Visit: Payer: Medicare HMO | Admitting: Urology

## 2021-12-08 VITALS — BP 165/84 | HR 73 | Ht 65.0 in | Wt 348.0 lb

## 2021-12-08 DIAGNOSIS — Z87440 Personal history of urinary (tract) infections: Secondary | ICD-10-CM

## 2021-12-08 DIAGNOSIS — N390 Urinary tract infection, site not specified: Secondary | ICD-10-CM

## 2021-12-08 NOTE — Progress Notes (Signed)
? ?12/08/2021 ?2:41 PM  ? ?Jamie Bennett ?20-Feb-1961 ?[id number] ? ?Referring provider: Lavera Guise, MD ?[address] ?[place],  [place] [address] ? ?Chief Complaint  ?Patient presents with  ? Recurrent UTI  ? ? ?HPI: ?61 y.o. female presents for follow-up of recurrent UTIs ? ?Initially seen 09/08/2021; see office visit for details ?She did start the Estrace as prescribed and after 3 weeks began to have night sweats and decided to discontinue ?She has remained on low-dose antibiotic prophylaxis and has been asymptomatic.  She completes her current supply medication and 1.5 weeks ?She is also taking cranberry supplement ?No complaints today and was not able to give a urine specimen ? ? ?PMH: ?Past Medical History:  ?Diagnosis Date  ? Anemia   ? Anxiety   ? Bipolar 1 disorder ([place])   ? Collagen vascular disease ([place])   ? rhematoid arthritis  ? Constipation   ? Dyspnea   ? Fibromyalgia   ? GERD (gastroesophageal reflux disease)   ? Heart murmur   ? mild-asymptomatic  ? Hepatic steatosis   ? History of kidney stones   ? History of Roux-en-Y gastric bypass   ? Hypotension   ? Hypothyroidism   ? Morbid obesity with BMI of 50.0-59.9, adult ([place])   ? Opioid abuse ([place])   ? Osteoarthritis   ? Osteoporosis   ? PONV (postoperative nausea and vomiting)   ? nausea only  ? Rheumatic fever   ? Rheumatoid arthritis ([place])   ? Small bowel obstruction ([place])   ? Thyroid disease   ? ? ?Surgical History: ?Past Surgical History:  ?Procedure Laterality Date  ? CHOLECYSTECTOMY  2004  ? COLONOSCOPY N/A 07/16/2017  ? Procedure: COLONOSCOPY;  Surgeon: Manya Silvas, MD;  Location: [facility] ENDOSCOPY;  Service: Endoscopy;  Laterality: N/A;  ? EXPLORATORY LAPAROTOMY  2009  ? GASTRIC BYPASS  2003  ? [facility]  ? HEEL SPUR EXCISION Bilateral   ? KNEE ARTHROSCOPY WITH SUBCHONDROPLASTY Left 09/27/2021  ? Procedure: Left knee subchondroplasty medial tibial plateau;  Surgeon: Hessie Knows, MD;  Location: [facility] ORS;  Service: Orthopedics;   Laterality: Left;  ? LAPAROTOMY N/A 03/01/2018  ? Procedure: EXPLORATORY LAPAROTOMY/ UMBILECTOMY;  Surgeon: Robert Bellow, MD;  Location: [facility] ORS;  Service: General;  Laterality: N/A;  ? TONSILLECTOMY    ? VENTRAL HERNIA REPAIR N/A 03/07/2018  ? Procedure: HERNIA REPAIR VENTRAL ADULT;  Surgeon: Robert Bellow, MD;  Location: [facility] ORS;  Service: General;  Laterality: N/A;  ? ? ?Home Medications:  ?Allergies as of 12/08/2021   ? ?   Reactions  ? Lactose Intolerance (gi) Other (See Comments)  ? Bloating and GI distress  ? Sulfa Antibiotics Hives  ? ?  ? ?  ?Medication List  ?  ? ?  ? Accurate as of Dec 08, 2021  2:41 PM. If you have any questions, ask your nurse or doctor.  ?  ?  ? ?  ? ?acetaminophen 500 MG tablet ?Commonly known as: TYLENOL ?Take 500-1,000 mg by mouth every 6 (six) hours as needed (pain.). ?  ?albuterol 108 (90 Base) MCG/ACT inhaler ?Commonly known as: VENTOLIN HFA ?Inhale 2 puffs into the lungs every 6 (six) hours as needed for wheezing or shortness of breath. ?  ?busPIRone 30 MG tablet ?Commonly known as: BUSPAR ?1 bid ?What changed:  ?how much to take ?how to take this ?when to take this ?  ?CALCIUM 600 PO ?Take 600 mg by mouth in the morning and at  bedtime. ?  ?Cholecalciferol 125 MCG (5000 UT) Tabs ?Take 5,000 Units by mouth in the morning. ?  ?clonazePAM 0.5 MG tablet ?Commonly known as: KLONOPIN ?TAKE 1 TABLET BY MOUTH ONCE DAILY AS NEEDED FOR  ACUTE  ANXIETY  EMERGENCIES  ONLY  DO  NOT  EXCEED  ONE  PER  DAY ?  ?Coenzyme Q10 300 MG Caps ?Take 300 mg by mouth in the morning. ?  ?cyanocobalamin 1000 MCG/ML injection ?Commonly known as: (VITAMIN B-12) ?Inject 1,000 mcg into the muscle every 30 (thirty) days. ?  ?denosumab 60 MG/ML Sosy injection ?Commonly known as: PROLIA ?Inject 60 mg into the skin every 6 (six) months. ?  ?esomeprazole 20 MG capsule ?Commonly known as: [place] ?Take 20 mg by mouth in the morning. ?  ?estradiol 0.1 MG/GM vaginal cream ?Commonly known as: ESTRACE ?Apply  a pea-sized amount to index finger and wipe in vaginal introitus twice weekly ?  ?gabapentin 100 MG capsule ?Commonly known as: NEURONTIN ?TAKE 2 CAPSULES BY MOUTH THREE TIMES DAILY ?  ?HYDROcodone-acetaminophen 7.5-325 MG tablet ?Commonly known as: Norco ?Take 1 tablet by mouth every 6 (six) hours as needed for moderate pain. ?  ?hydroxychloroquine 200 MG tablet ?Commonly known as: PLAQUENIL ?TAKE ONE TABLET BY MOUTH TWICE A DAY ?  ?hydrOXYzine 50 MG tablet ?Commonly known as: ATARAX ?1  Tid   1  Prn q day ?What changed:  ?how much to take ?how to take this ?when to take this ?reasons to take this ?additional instructions ?  ?Krill Oil 500 MG Caps ?Take 500 mg by mouth daily at 12 noon. ?  ?lamoTRIgine 150 MG tablet ?Commonly known as: LAMICTAL ?Take 1 tablet (150 mg total) by mouth 2 (two) times daily. ?  ?levothyroxine 112 MCG tablet ?Commonly known as: SYNTHROID ?Take 1 tablet (112 mcg total) by mouth daily. ?What changed: when to take this ?  ?loratadine 10 MG tablet ?Commonly known as: CLARITIN ?Take 10 mg by mouth daily as needed for allergies. ?  ?magnesium oxide 400 MG tablet ?Commonly known as: MAG-OX ?Take 400 mg by mouth every evening. ?  ?multivitamin tablet ?Take 1 tablet by mouth every evening. ?  ?nitrofurantoin 50 MG capsule ?Commonly known as: MACRODANTIN ?Take 1 capsule (50 mg total) by mouth daily. ?  ?polyethylene glycol 17 g packet ?Commonly known as: MIRALAX / GLYCOLAX ?Take 17 g by mouth in the morning and at bedtime. ?  ?psyllium 58.6 % packet ?Commonly known as: METAMUCIL ?Take 1 packet by mouth in the morning. ?  ?sucralfate 1 g tablet ?Commonly known as: CARAFATE ?Take 1 tablet (1 g total) by mouth 2 (two) times daily. ?  ?tiZANidine 4 MG tablet ?Commonly known as: ZANAFLEX ?TAKE 1 TABLET BY MOUTH THREE TIMES DAILY ?  ?traZODone 100 MG tablet ?Commonly known as: DESYREL ?Take 1 tablet (100 mg total) by mouth at bedtime as needed and may repeat dose one time if needed for sleep. ?   ?venlafaxine 100 MG tablet ?Commonly known as: EFFEXOR ?Take 1 tablet (100 mg total) by mouth 3 (three) times daily with meals. ?  ?vitamin C 500 MG tablet ?Commonly known as: ASCORBIC ACID ?Take 500 mg by mouth in the morning. ?  ?zinc gluconate 50 MG tablet ?Take 50 mg by mouth in the morning. ?  ? ?  ? ? ?Allergies:  ?Allergies  ?Allergen Reactions  ? Lactose Intolerance (Gi) Other (See Comments)  ?  Bloating and GI distress  ? Sulfa Antibiotics Hives  ? ? ?  Family History: ?Family History  ?Problem Relation Age of Onset  ? Depression Mother   ? Dementia Mother   ? Heart disease Mother   ? Osteoporosis Mother   ? Parkinson's disease Father   ? Liver disease Brother   ? Colon cancer Neg Hx   ? ? ?Social History:  reports that she has never smoked. She has been exposed to tobacco smoke. She has never used smokeless tobacco. She reports that she does not currently use alcohol. She reports that she does not use drugs. ? ? ?Physical Exam: ?BP (!) 165/84   Pulse 73   Ht '5\' 5"'$  (1.651 m)   Wt (!) 348 lb (157.9 kg)   BMI 57.91 kg/m?   ?Constitutional:  Alert and oriented, No acute distress. ?HEENT: [place] AT, moist mucus membranes.  Trachea midline, no masses. ?Cardiovascular: No clubbing, cyanosis, or edema. ?Respiratory: Normal respiratory effort, no increased work of breathing. ?Psychiatric: Normal mood and affect. ? ? ?Assessment & Plan:   ? ?1. Recurrent UTI ?Asymptomatic on low-dose antibiotic prophylaxis ?I had recommended a 2-66-monthcourse and she completes 3 months and 1.5 weeks.  Would discontinue after completing current course ?We discussed the low-dose of vaginal estrogen is not enough for systemic absorption and that her sweats were unlikely related to the medication.  She will consider restarting ?Lab visit for repeat urinalysis 2-3 weeks after she completes her low-dose prophylaxis ?658-monthollow-up visit and instructed to call earlier for recurrent UTI symptoms ? ? ?ScAbbie SonsMD ? ?[place] ?[place] [phone] ?

## 2021-12-09 ENCOUNTER — Encounter: Payer: Self-pay | Admitting: Urology

## 2021-12-17 ENCOUNTER — Other Ambulatory Visit: Payer: Self-pay | Admitting: Nurse Practitioner

## 2021-12-17 DIAGNOSIS — G6189 Other inflammatory polyneuropathies: Secondary | ICD-10-CM

## 2021-12-17 DIAGNOSIS — M542 Cervicalgia: Secondary | ICD-10-CM

## 2021-12-22 ENCOUNTER — Other Ambulatory Visit: Payer: Medicare HMO

## 2021-12-22 ENCOUNTER — Other Ambulatory Visit: Payer: Self-pay | Admitting: Family Medicine

## 2021-12-22 DIAGNOSIS — N390 Urinary tract infection, site not specified: Secondary | ICD-10-CM

## 2021-12-22 DIAGNOSIS — M810 Age-related osteoporosis without current pathological fracture: Secondary | ICD-10-CM | POA: Diagnosis not present

## 2021-12-22 DIAGNOSIS — E559 Vitamin D deficiency, unspecified: Secondary | ICD-10-CM | POA: Diagnosis not present

## 2021-12-22 DIAGNOSIS — Z9884 Bariatric surgery status: Secondary | ICD-10-CM | POA: Diagnosis not present

## 2021-12-22 DIAGNOSIS — E039 Hypothyroidism, unspecified: Secondary | ICD-10-CM | POA: Diagnosis not present

## 2021-12-22 LAB — URINALYSIS, COMPLETE
Bilirubin, UA: NEGATIVE
Glucose, UA: NEGATIVE
Ketones, UA: NEGATIVE
Nitrite, UA: NEGATIVE
Protein,UA: NEGATIVE
Specific Gravity, UA: 1.015 (ref 1.005–1.030)
Urobilinogen, Ur: 0.2 mg/dL (ref 0.2–1.0)
pH, UA: 6.5 (ref 5.0–7.5)

## 2021-12-22 LAB — MICROSCOPIC EXAMINATION: WBC, UA: >30 /hpf — AB (ref 0–5)

## 2021-12-25 LAB — CULTURE, URINE COMPREHENSIVE

## 2021-12-27 ENCOUNTER — Telehealth: Payer: Self-pay | Admitting: *Deleted

## 2021-12-27 NOTE — Telephone Encounter (Signed)
Patient states she is having some burning with urination .

## 2021-12-27 NOTE — Telephone Encounter (Signed)
-----   Message from Abbie Sons, MD sent at 12/25/2021  9:48 PM EDT ----- Urine c growing bacteria- is she having uti sxs?

## 2021-12-28 ENCOUNTER — Other Ambulatory Visit: Payer: Self-pay | Admitting: *Deleted

## 2021-12-28 MED ORDER — AMOXICILLIN 875 MG PO TABS: 875.0000 mg | ORAL_TABLET | Freq: Two times a day (BID) | ORAL | 0 refills | Status: AC

## 2021-12-28 NOTE — Telephone Encounter (Signed)
Rx sent 

## 2021-12-28 NOTE — Telephone Encounter (Signed)
Please send in Rx amoxicillin 875 mg twice daily x7 days

## 2022-01-03 ENCOUNTER — Telehealth: Payer: Self-pay

## 2022-01-03 NOTE — Telephone Encounter (Signed)
Patient called on the triage line today stating that she finished her course of abx yesterday evening but is still having dysuria. She started AZO with some relief but is concerned the infection is not resolved. Patient confirms she is utilizing estrace cream. Per Idamae Schuller, PAC patient should continue to utilize AZO increase water intake, monitor symptoms for 48 hours to see if they resolve. If symptoms do not or if they worsen she should call back for further evaluation. Patient verbalized understanding and is in agreement with this plan

## 2022-01-05 ENCOUNTER — Encounter: Payer: Self-pay | Admitting: Physician Assistant

## 2022-01-05 ENCOUNTER — Other Ambulatory Visit: Payer: Self-pay | Admitting: Physician Assistant

## 2022-01-05 ENCOUNTER — Ambulatory Visit (INDEPENDENT_AMBULATORY_CARE_PROVIDER_SITE_OTHER): Payer: Medicare HMO | Admitting: Physician Assistant

## 2022-01-05 VITALS — BP 145/84 | HR 75 | Ht 65.0 in | Wt 348.0 lb

## 2022-01-05 DIAGNOSIS — N390 Urinary tract infection, site not specified: Secondary | ICD-10-CM

## 2022-01-05 MED ORDER — NITROFURANTOIN MONOHYD MACRO 100 MG PO CAPS: 100.0000 mg | ORAL_CAPSULE | Freq: Two times a day (BID) | ORAL | 0 refills | Status: AC

## 2022-01-05 NOTE — Progress Notes (Unsigned)
01/05/2022 4:23 PM   Jamie Bennett 1961-05-07 [id number]  CC: Chief Complaint  Patient presents with   Urinary Tract Infection   HPI: Jamie Bennett is a 61 y.o. female with PMH recurrent UTIs previously on suppressive Macrobid who presents today for evaluation of possible UTI.   She completed 7 days of Augmentin twice daily per Dr. Bernardo Heater 3 days ago to treat a UTI with urine culture growing tetracycline resistant E faecalis.  Today she reports her dysuria initially improved after 2 days of the amoxicillin, however her symptoms returned stronger than they were originally toward the end of her course of antibiotics.  She denies fever, chills, nausea, vomiting, gross hematuria, and flank pain.  She has been using estrogen cream twice weekly and taking cranberry supplements.  In-office catheterized UA today positive for 1+ protein, nitrites, and 3+ leukocyte esterase; urine microscopy with >30 WBCs/HPF and many bacteria.   PMH: Past Medical History:  Diagnosis Date   Anemia    Anxiety    Bipolar 1 disorder ([place])    Collagen vascular disease ([place])    rhematoid arthritis   Constipation    Dyspnea    Fibromyalgia    GERD (gastroesophageal reflux disease)    Heart murmur    mild-asymptomatic   Hepatic steatosis    History of kidney stones    History of Roux-en-Y gastric bypass    Hypotension    Hypothyroidism    Morbid obesity with BMI of 50.0-59.9, adult ([facility])    Opioid abuse ([place])    Osteoarthritis    Osteoporosis    PONV (postoperative nausea and vomiting)    nausea only   Rheumatic fever    Rheumatoid arthritis ([place])    Small bowel obstruction ([place])    Thyroid disease     Surgical History: Past Surgical History:  Procedure Laterality Date   CHOLECYSTECTOMY  2004   COLONOSCOPY N/A 07/16/2017   Procedure: COLONOSCOPY;  Surgeon: Manya Silvas, MD;  Location: [facility] ENDOSCOPY;  Service: Endoscopy;  Laterality: N/A;   EXPLORATORY LAPAROTOMY  2009    GASTRIC BYPASS  2003   [facility]   HEEL SPUR EXCISION Bilateral    KNEE ARTHROSCOPY WITH SUBCHONDROPLASTY Left 09/27/2021   Procedure: Left knee subchondroplasty medial tibial plateau;  Surgeon: Hessie Knows, MD;  Location: [facility] ORS;  Service: Orthopedics;  Laterality: Left;   LAPAROTOMY N/A 03/01/2018   Procedure: EXPLORATORY LAPAROTOMY/ UMBILECTOMY;  Surgeon: Robert Bellow, MD;  Location: [facility] ORS;  Service: General;  Laterality: N/A;   TONSILLECTOMY     VENTRAL HERNIA REPAIR N/A 03/07/2018   Procedure: HERNIA REPAIR VENTRAL ADULT;  Surgeon: Robert Bellow, MD;  Location: [facility] ORS;  Service: General;  Laterality: N/A;    Home Medications:  Allergies as of 01/05/2022       Reactions   Lactose Intolerance (gi) Other (See Comments)   Bloating and GI distress   Sulfa Antibiotics Hives        Medication List        Accurate as of January 05, 2022  4:23 PM. If you have any questions, ask your nurse or doctor.          STOP taking these medications    nitrofurantoin 50 MG capsule Commonly known as: MACRODANTIN Stopped by: Debroah Loop, PA-C       TAKE these medications    acetaminophen 500 MG tablet Commonly known as: TYLENOL Take 500-1,000 mg by mouth every 6 (six) hours as needed (pain.).  albuterol 108 (90 Base) MCG/ACT inhaler Commonly known as: VENTOLIN HFA Inhale 2 puffs into the lungs every 6 (six) hours as needed for wheezing or shortness of breath.   busPIRone 30 MG tablet Commonly known as: BUSPAR 1 bid What changed:  how much to take how to take this when to take this   CALCIUM 600 PO Take 600 mg by mouth in the morning and at bedtime.   Cholecalciferol 125 MCG (5000 UT) Tabs Take 5,000 Units by mouth in the morning.   clonazePAM 0.5 MG tablet Commonly known as: KLONOPIN TAKE 1 TABLET BY MOUTH ONCE DAILY AS NEEDED FOR  ACUTE  ANXIETY  EMERGENCIES  ONLY  DO  NOT  EXCEED  ONE  PER  DAY   Coenzyme Q10 300 MG Caps Take 300 mg  by mouth in the morning.   cyanocobalamin 1000 MCG/ML injection Commonly known as: (VITAMIN B-12) Inject 1,000 mcg into the muscle every 30 (thirty) days.   denosumab 60 MG/ML Sosy injection Commonly known as: PROLIA Inject 60 mg into the skin every 6 (six) months.   esomeprazole 20 MG capsule Commonly known as: NEXIUM Take 20 mg by mouth in the morning.   estradiol 0.1 MG/GM vaginal cream Commonly known as: ESTRACE Apply a pea-sized amount to index finger and wipe in vaginal introitus twice weekly   gabapentin 100 MG capsule Commonly known as: NEURONTIN TAKE 2 CAPSULES BY MOUTH THREE TIMES DAILY   HYDROcodone-acetaminophen 7.5-325 MG tablet Commonly known as: Norco Take 1 tablet by mouth every 6 (six) hours as needed for moderate pain.   hydroxychloroquine 200 MG tablet Commonly known as: PLAQUENIL TAKE ONE TABLET BY MOUTH TWICE A DAY   hydrOXYzine 50 MG tablet Commonly known as: ATARAX 1  Tid   1  Prn q day What changed:  how much to take how to take this when to take this reasons to take this additional instructions   Krill Oil 500 MG Caps Take 500 mg by mouth daily at 12 noon.   lamoTRIgine 150 MG tablet Commonly known as: LAMICTAL Take 1 tablet (150 mg total) by mouth 2 (two) times daily.   levothyroxine 112 MCG tablet Commonly known as: SYNTHROID Take 1 tablet (112 mcg total) by mouth daily. What changed: when to take this   loratadine 10 MG tablet Commonly known as: CLARITIN Take 10 mg by mouth daily as needed for allergies.   magnesium oxide 400 MG tablet Commonly known as: MAG-OX Take 400 mg by mouth every evening.   multivitamin tablet Take 1 tablet by mouth every evening.   nitrofurantoin (macrocrystal-monohydrate) 100 MG capsule Commonly known as: MACROBID Take 1 capsule (100 mg total) by mouth 2 (two) times daily for 7 days. Started by: Debroah Loop, PA-C   polyethylene glycol 17 g packet Commonly known as: MIRALAX /  GLYCOLAX Take 17 g by mouth in the morning and at bedtime.   psyllium 58.6 % packet Commonly known as: METAMUCIL Take 1 packet by mouth in the morning.   sucralfate 1 g tablet Commonly known as: CARAFATE Take 1 tablet (1 g total) by mouth 2 (two) times daily.   tiZANidine 4 MG tablet Commonly known as: ZANAFLEX TAKE 1 TABLET BY MOUTH THREE TIMES DAILY   traZODone 100 MG tablet Commonly known as: DESYREL Take 1 tablet (100 mg total) by mouth at bedtime as needed and may repeat dose one time if needed for sleep.   venlafaxine 100 MG tablet Commonly known as: EFFEXOR Take 1  tablet (100 mg total) by mouth 3 (three) times daily with meals.   vitamin C 500 MG tablet Commonly known as: ASCORBIC ACID Take 500 mg by mouth in the morning.   zinc gluconate 50 MG tablet Take 50 mg by mouth in the morning.        Allergies:  Allergies  Allergen Reactions   Lactose Intolerance (Gi) Other (See Comments)    Bloating and GI distress   Sulfa Antibiotics Hives    Family History: Family History  Problem Relation Age of Onset   Depression Mother    Dementia Mother    Heart disease Mother    Osteoporosis Mother    Parkinson's disease Father    Liver disease Brother    Colon cancer Neg Hx     Social History:   reports that she has never smoked. She has been exposed to tobacco smoke. She has never used smokeless tobacco. She reports that she does not currently use alcohol. She reports that she does not use drugs.  Physical Exam: BP (!) 145/84   Pulse 75   Ht '5\' 5"'$  (1.651 m)   Wt (!) 348 lb (157.9 kg)   BMI 57.91 kg/m   Constitutional:  Alert and oriented, no acute distress, nontoxic appearing HEENT: [place], AT Cardiovascular: No clubbing, cyanosis, or edema Respiratory: Normal respiratory effort, no increased work of breathing Skin: No rashes, bruises or suspicious lesions Neurologic: Grossly intact, no focal deficits, moving all 4 extremities Psychiatric: Normal mood and  affect  Laboratory Data: Results for orders placed or performed in visit on 01/05/22  CULTURE, URINE COMPREHENSIVE   Specimen: Urine   UR  Result Value Ref Range   Urine Culture, Comprehensive Final report (A)    Organism ID, Bacteria Escherichia coli (A)    ANTIMICROBIAL SUSCEPTIBILITY Comment   Microscopic Examination   Urine  Result Value Ref Range   WBC, UA >30 (A) 0 - 5 /hpf   RBC 0-2 0 - 2 /hpf   Epithelial Cells (non renal) 0-10 0 - 10 /hpf   Bacteria, UA Many (A) None seen/Few  Urinalysis, Complete  Result Value Ref Range   Specific Gravity, UA 1.030 1.005 - 1.030   pH, UA 5.5 5.0 - 7.5   Color, UA Yellow Yellow   Appearance Ur Cloudy (A) Clear   Leukocytes,UA 3+ (A) Negative   Protein,UA 1+ (A) Negative/Trace   Glucose, UA Negative Negative   Ketones, UA Negative Negative   RBC, UA Negative Negative   Bilirubin, UA Negative Negative   Urobilinogen, Ur 0.2 0.2 - 1.0 mg/dL   Nitrite, UA Positive (A) Negative   Microscopic Examination See below:    Assessment & Plan:   1. Recurrent UTI UA appears grossly infected today consistent with recurrent versus persistent UTI.  Will start empiric Macrobid and send for culture for further evaluation.  We discussed augmenting her UTI prevention regimen with d-mannose and lactobacillus containing probiotics.  We discussed the pathophysiology of recurrent UTIs in postmenopausal women and I encouraged her to increase vaginal cream to 3 times weekly.  I encouraged her to continue cranberry supplements twice daily. - Urinalysis, Complete - CULTURE, URINE COMPREHENSIVE - nitrofurantoin, macrocrystal-monohydrate, (MACROBID) 100 MG capsule; Take 1 capsule (100 mg total) by mouth 2 (two) times daily for 7 days.  Dispense: 14 capsule; Refill: 0  Return if symptoms worsen or fail to improve.  Debroah Loop, PA-C  [facility] [address], [place], [place] [address] [phone]

## 2022-01-05 NOTE — Patient Instructions (Signed)
Recurrent UTI Prevention Strategies  Continue taking an over-the-counter cranberry supplement for urinary tract health. Take this once or twice daily on an empty stomach, e.g. right before bed, if you can tolerate doing so. Start taking an over-the-counter probiotic, preferably containing lactobacillus. Take this daily. Start taking an over-the-counter d-mannose supplement daily. Continue vaginal estrogen cream. Apply a pea-sized amount around the opening of the urethra three times weekly.

## 2022-01-06 LAB — URINALYSIS, COMPLETE
Bilirubin, UA: NEGATIVE
Glucose, UA: NEGATIVE
Ketones, UA: NEGATIVE
Nitrite, UA: POSITIVE — AB
RBC, UA: NEGATIVE
Specific Gravity, UA: 1.03 (ref 1.005–1.030)
Urobilinogen, Ur: 0.2 mg/dL (ref 0.2–1.0)
pH, UA: 5.5 (ref 5.0–7.5)

## 2022-01-06 LAB — MICROSCOPIC EXAMINATION: WBC, UA: >30 /hpf — AB (ref 0–5)

## 2022-01-09 LAB — CULTURE, URINE COMPREHENSIVE

## 2022-01-10 ENCOUNTER — Inpatient Hospital Stay: Payer: Medicare HMO

## 2022-01-10 ENCOUNTER — Inpatient Hospital Stay (HOSPITAL_BASED_OUTPATIENT_CLINIC_OR_DEPARTMENT_OTHER): Payer: Medicare HMO | Admitting: Medical Oncology

## 2022-01-10 ENCOUNTER — Inpatient Hospital Stay: Payer: Medicare HMO | Attending: Oncology

## 2022-01-10 ENCOUNTER — Other Ambulatory Visit: Payer: Self-pay

## 2022-01-10 VITALS — BP 126/60 | HR 66 | Resp 18 | Ht 65.3 in | Wt 352.0 lb

## 2022-01-10 DIAGNOSIS — E611 Iron deficiency: Secondary | ICD-10-CM | POA: Insufficient documentation

## 2022-01-10 DIAGNOSIS — Z882 Allergy status to sulfonamides status: Secondary | ICD-10-CM | POA: Insufficient documentation

## 2022-01-10 DIAGNOSIS — Z818 Family history of other mental and behavioral disorders: Secondary | ICD-10-CM | POA: Diagnosis not present

## 2022-01-10 DIAGNOSIS — E538 Deficiency of other specified B group vitamins: Secondary | ICD-10-CM | POA: Diagnosis not present

## 2022-01-10 DIAGNOSIS — D649 Anemia, unspecified: Secondary | ICD-10-CM

## 2022-01-10 DIAGNOSIS — Z79899 Other long term (current) drug therapy: Secondary | ICD-10-CM | POA: Diagnosis not present

## 2022-01-10 DIAGNOSIS — Z87442 Personal history of urinary calculi: Secondary | ICD-10-CM | POA: Insufficient documentation

## 2022-01-10 DIAGNOSIS — Z8262 Family history of osteoporosis: Secondary | ICD-10-CM | POA: Insufficient documentation

## 2022-01-10 DIAGNOSIS — M069 Rheumatoid arthritis, unspecified: Secondary | ICD-10-CM | POA: Insufficient documentation

## 2022-01-10 DIAGNOSIS — Z8249 Family history of ischemic heart disease and other diseases of the circulatory system: Secondary | ICD-10-CM | POA: Diagnosis not present

## 2022-01-10 DIAGNOSIS — Z9049 Acquired absence of other specified parts of digestive tract: Secondary | ICD-10-CM | POA: Diagnosis not present

## 2022-01-10 DIAGNOSIS — E039 Hypothyroidism, unspecified: Secondary | ICD-10-CM | POA: Insufficient documentation

## 2022-01-10 DIAGNOSIS — Z8379 Family history of other diseases of the digestive system: Secondary | ICD-10-CM | POA: Diagnosis not present

## 2022-01-10 DIAGNOSIS — D509 Iron deficiency anemia, unspecified: Secondary | ICD-10-CM

## 2022-01-10 DIAGNOSIS — D519 Vitamin B12 deficiency anemia, unspecified: Secondary | ICD-10-CM | POA: Diagnosis not present

## 2022-01-10 DIAGNOSIS — M797 Fibromyalgia: Secondary | ICD-10-CM | POA: Insufficient documentation

## 2022-01-10 DIAGNOSIS — Z9884 Bariatric surgery status: Secondary | ICD-10-CM | POA: Insufficient documentation

## 2022-01-10 LAB — COMPREHENSIVE METABOLIC PANEL
ALT: 16 U/L (ref 0–44)
AST: 23 U/L (ref 15–41)
Albumin: 3.9 g/dL (ref 3.5–5.0)
Alkaline Phosphatase: 105 U/L (ref 38–126)
Anion gap: 11 (ref 5–15)
BUN: 22 mg/dL (ref 8–23)
CO2: 22 mmol/L (ref 22–32)
Calcium: 9.1 mg/dL (ref 8.9–10.3)
Chloride: 104 mmol/L (ref 98–111)
Creatinine, Ser: 1 mg/dL (ref 0.44–1.00)
GFR, Estimated: >60 mL/min (ref >60)
Glucose, Bld: 91 mg/dL (ref 70–99)
Potassium: 3.8 mmol/L (ref 3.5–5.1)
Sodium: 137 mmol/L (ref 135–145)
Total Bilirubin: 0.4 mg/dL (ref 0.3–1.2)
Total Protein: 7.2 g/dL (ref 6.5–8.1)

## 2022-01-10 LAB — IRON AND TIBC
Iron: 97 ug/dL (ref 28–170)
Saturation Ratios: 21 % (ref 10.4–31.8)
TIBC: 458 ug/dL — ABNORMAL HIGH (ref 250–450)
UIBC: 361 ug/dL

## 2022-01-10 LAB — CBC WITH DIFFERENTIAL/PLATELET
Abs Immature Granulocytes: 0.02 10*3/uL (ref 0.00–0.07)
Basophils Absolute: 0 10*3/uL (ref 0.0–0.1)
Basophils Relative: 1 %
Eosinophils Absolute: 0.3 10*3/uL (ref 0.0–0.5)
Eosinophils Relative: 7 %
HCT: 37.8 % (ref 36.0–46.0)
Hemoglobin: 12 g/dL (ref 12.0–15.0)
Immature Granulocytes: 0 %
Lymphocytes Relative: 40 %
Lymphs Abs: 2 10*3/uL (ref 0.7–4.0)
MCH: 28.7 pg (ref 26.0–34.0)
MCHC: 31.7 g/dL (ref 30.0–36.0)
MCV: 90.4 fL (ref 80.0–100.0)
Monocytes Absolute: 0.5 10*3/uL (ref 0.1–1.0)
Monocytes Relative: 11 %
Neutro Abs: 2 10*3/uL (ref 1.7–7.7)
Neutrophils Relative %: 41 %
Platelets: 208 10*3/uL (ref 150–400)
RBC: 4.18 MIL/uL (ref 3.87–5.11)
RDW: 13.4 % (ref 11.5–15.5)
WBC: 4.9 10*3/uL (ref 4.0–10.5)
nRBC: 0 % (ref 0.0–0.2)

## 2022-01-10 LAB — FERRITIN: Ferritin: 37 ng/mL (ref 11–307)

## 2022-01-10 LAB — VITAMIN B12: Vitamin B-12: 596 pg/mL (ref 180–914)

## 2022-01-10 MED ORDER — CYANOCOBALAMIN 1000 MCG/ML IJ SOLN
1000.0000 ug | Freq: Once | INTRAMUSCULAR | Status: AC
  Administered 2022-01-10: 1000 ug via INTRAMUSCULAR
  Filled 2022-01-10: qty 1

## 2022-01-10 NOTE — Progress Notes (Unsigned)
Patient reports some constipation and she is on antibiotic for a uti which she started on Friday still having some pain but urine cloudiness has cleared some. Fall 5 days ago and hit stair rail.  Left knee pain from surgery and fall.

## 2022-01-11 ENCOUNTER — Ambulatory Visit (HOSPITAL_BASED_OUTPATIENT_CLINIC_OR_DEPARTMENT_OTHER): Payer: Medicare HMO | Admitting: Psychiatry

## 2022-01-11 ENCOUNTER — Telehealth: Payer: Self-pay

## 2022-01-11 ENCOUNTER — Encounter: Payer: Self-pay | Admitting: Hematology and Oncology

## 2022-01-11 ENCOUNTER — Inpatient Hospital Stay: Payer: Medicare HMO

## 2022-01-11 DIAGNOSIS — F411 Generalized anxiety disorder: Secondary | ICD-10-CM | POA: Diagnosis not present

## 2022-01-11 DIAGNOSIS — F325 Major depressive disorder, single episode, in full remission: Secondary | ICD-10-CM | POA: Diagnosis not present

## 2022-01-11 DIAGNOSIS — N390 Urinary tract infection, site not specified: Secondary | ICD-10-CM

## 2022-01-11 DIAGNOSIS — F332 Major depressive disorder, recurrent severe without psychotic features: Secondary | ICD-10-CM

## 2022-01-11 DIAGNOSIS — F603 Borderline personality disorder: Secondary | ICD-10-CM

## 2022-01-11 DIAGNOSIS — R69 Illness, unspecified: Secondary | ICD-10-CM | POA: Diagnosis not present

## 2022-01-11 MED ORDER — HYDROXYZINE HCL 50 MG PO TABS: 50.0000 mg | ORAL_TABLET | Freq: Three times a day (TID) | ORAL | 4 refills | Status: DC | PRN

## 2022-01-11 MED ORDER — VENLAFAXINE HCL 100 MG PO TABS
100.0000 mg | ORAL_TABLET | Freq: Three times a day (TID) | ORAL | 2 refills | Status: DC
Start: 2022-01-11 — End: 2022-05-10

## 2022-01-11 MED ORDER — CLONAZEPAM 0.5 MG PO TABS: ORAL_TABLET | ORAL | 5 refills | Status: DC

## 2022-01-11 MED ORDER — BUSPIRONE HCL 30 MG PO TABS: ORAL_TABLET | ORAL | 2 refills | Status: DC

## 2022-01-11 MED ORDER — LAMOTRIGINE 150 MG PO TABS: 150.0000 mg | ORAL_TABLET | Freq: Two times a day (BID) | ORAL | 0 refills | Status: DC

## 2022-01-11 NOTE — Progress Notes (Signed)
Hematology/Oncology Progress Note [facility]  [phone] [phone]  Patient Care Team: Carolynne Edouard as PCP - General (Physician Assistant)   Name of the patient: Jamie Bennett  [id number]  08-Dec-1960   Date of visit: 01/11/22  Diagnosis-iron and B12 deficiency anemia  Chief complaint/ Reason for visit-routine follow-up of anemia  Heme/Onc history: Patient is a 61 year old female with history of gastric bypass surgery in 2003.  She is subsequent developed iron and B12 deficiency and has been getting IV iron from time to time.  She was previously seen Dr. Mike Gip and has transferred care to Dr. Janese Banks  Interval history-patient is 61 year old female with above history of gastric bypass and history of iron and B12 anemia.  She returns to clinic for labs, further evaluation, and consideration of B12.  Today she reports that she is feeling well. She has no specific concerns. No black or bloody stools. No muscle cramps, SOB, pica. [place] a few days ago and has some bruising on arms but denies hitting her head or LOC.   ECOG PS- 1 Pain scale- 0  Review of systems- Review of Systems  Constitutional:  Negative for chills, fever, malaise/fatigue and weight loss.  HENT:  Negative for congestion, ear discharge and nosebleeds.   Eyes:  Negative for blurred vision.  Respiratory:  Negative for cough, hemoptysis, sputum production, shortness of breath and wheezing.   Cardiovascular:  Negative for chest pain, palpitations, orthopnea and claudication.  Gastrointestinal:  Negative for abdominal pain, blood in stool, constipation, diarrhea, heartburn, melena, nausea and vomiting.  Genitourinary:  Negative for dysuria, flank pain, frequency, hematuria and urgency.  Musculoskeletal:  Negative for back pain, joint pain and myalgias.  Skin:  Negative for rash.  Neurological:  Negative for dizziness, tingling, focal weakness, seizures, weakness and  headaches.  Endo/Heme/Allergies:  Does not bruise/bleed easily.  Psychiatric/Behavioral:  Negative for depression and suicidal ideas. The patient does not have insomnia.       Allergies  Allergen Reactions   Lactose Intolerance (Gi) Other (See Comments)    Bloating and GI distress   Sulfa Antibiotics Hives    Past Medical History:  Diagnosis Date   Anemia    Anxiety    Bipolar 1 disorder ([place])    Collagen vascular disease ([place])    rhematoid arthritis   Constipation    Dyspnea    Fibromyalgia    GERD (gastroesophageal reflux disease)    Heart murmur    mild-asymptomatic   Hepatic steatosis    History of kidney stones    History of Roux-en-Y gastric bypass    Hypotension    Hypothyroidism    Morbid obesity with BMI of 50.0-59.9, adult ([place])    Opioid abuse ([place])    Osteoarthritis    Osteoporosis    PONV (postoperative nausea and vomiting)    nausea only   Rheumatic fever    Rheumatoid arthritis ([place])    Small bowel obstruction ([place])    Thyroid disease     Past Surgical History:  Procedure Laterality Date   CHOLECYSTECTOMY  2004   COLONOSCOPY N/A 07/16/2017   Procedure: COLONOSCOPY;  Surgeon: Manya Silvas, MD;  Location: [facility] ENDOSCOPY;  Service: Endoscopy;  Laterality: N/A;   EXPLORATORY LAPAROTOMY  2009   GASTRIC BYPASS  2003   [facility]   HEEL SPUR EXCISION Bilateral    KNEE ARTHROSCOPY WITH SUBCHONDROPLASTY Left 09/27/2021   Procedure: Left knee subchondroplasty medial tibial plateau;  Surgeon: Hessie Knows, MD;  Location: [facility] ORS;  Service: Orthopedics;  Laterality: Left;   LAPAROTOMY N/A 03/01/2018   Procedure: EXPLORATORY LAPAROTOMY/ UMBILECTOMY;  Surgeon: Robert Bellow, MD;  Location: [facility] ORS;  Service: General;  Laterality: N/A;   TONSILLECTOMY     VENTRAL HERNIA REPAIR N/A 03/07/2018   Procedure: HERNIA REPAIR VENTRAL ADULT;  Surgeon: Robert Bellow, MD;  Location: [facility] ORS;  Service: General;  Laterality: N/A;    Social  History   Socioeconomic History   Marital status: Married    Spouse name: Not on file   Number of children: Not on file   Years of education: Not on file   Highest education level: Not on file  Occupational History   Not on file  Tobacco Use   Smoking status: Never    Passive exposure: Past   Smokeless tobacco: Never  Vaping Use   Vaping Use: Never used  Substance and Sexual Activity   Alcohol use: Not Currently   Drug use: No   Sexual activity: Yes  Other Topics Concern   Not on file  Social History Narrative   Not on file   Social Determinants of Health   Financial Resource Strain: Not on file  Food Insecurity: Not on file  Transportation Needs: Not on file  Physical Activity: Not on file  Stress: Not on file  Social Connections: Not on file  Intimate Partner Violence: Not on file    Family History  Problem Relation Age of Onset   Depression Mother    Dementia Mother    Heart disease Mother    Osteoporosis Mother    Parkinson's disease Father    Liver disease Brother    Colon cancer Neg Hx      Current Outpatient Medications:    acetaminophen (TYLENOL) 500 MG tablet, Take 500-1,000 mg by mouth every 6 (six) hours as needed (pain.)., Disp: , Rfl:    albuterol (VENTOLIN HFA) 108 (90 Base) MCG/ACT inhaler, Inhale 2 puffs into the lungs every 6 (six) hours as needed for wheezing or shortness of breath., Disp: 8 g, Rfl: 0   busPIRone (BUSPAR) 30 MG tablet, 1 bid (Patient taking differently: Take 10 mg by mouth 3 (three) times daily. 1 bid), Disp: 180 tablet, Rfl: 2   Calcium Carbonate (CALCIUM 600 PO), Take 600 mg by mouth in the morning and at bedtime., Disp: , Rfl:    Cholecalciferol 125 MCG (5000 UT) TABS, Take 5,000 Units by mouth in the morning., Disp: , Rfl:    clonazePAM (KLONOPIN) 0.5 MG tablet, TAKE 1 TABLET BY MOUTH ONCE DAILY AS NEEDED FOR  ACUTE  ANXIETY  EMERGENCIES  ONLY  DO  NOT  EXCEED  ONE  PER  DAY, Disp: 30 tablet, Rfl: 5   Coenzyme Q10 300 MG  CAPS, Take 300 mg by mouth in the morning., Disp: , Rfl:    cyanocobalamin (,VITAMIN B-12,) 1000 MCG/ML injection, Inject 1,000 mcg into the muscle every 30 (thirty) days. , Disp: , Rfl:    denosumab (PROLIA) 60 MG/ML SOSY injection, Inject 60 mg into the skin every 6 (six) months., Disp: , Rfl:    esomeprazole (NEXIUM) 20 MG capsule, Take 20 mg by mouth in the morning., Disp: , Rfl:    estradiol (ESTRACE) 0.1 MG/GM vaginal cream, Apply a pea-sized amount to index finger and wipe in vaginal introitus twice weekly, Disp: 42.5 g, Rfl: 12   gabapentin (NEURONTIN) 100 MG capsule, TAKE 2 CAPSULES BY MOUTH THREE TIMES DAILY, Disp: 180 capsule, Rfl:  1   HYDROcodone-acetaminophen (NORCO) 7.5-325 MG tablet, Take 1 tablet by mouth every 6 (six) hours as needed for moderate pain., Disp: 30 tablet, Rfl: 0   hydroxychloroquine (PLAQUENIL) 200 MG tablet, TAKE ONE TABLET BY MOUTH TWICE A DAY, Disp: 180 tablet, Rfl: 0   hydrOXYzine (ATARAX) 50 MG tablet, 1  Tid   1  Prn q day (Patient taking differently: Take 50 mg by mouth 3 (three) times daily as needed for anxiety.), Disp: 360 tablet, Rfl: 1   Krill Oil 500 MG CAPS, Take 500 mg by mouth daily at 12 noon., Disp: , Rfl:    lamoTRIgine (LAMICTAL) 150 MG tablet, Take 1 tablet (150 mg total) by mouth 2 (two) times daily., Disp: 68 tablet, Rfl: 0   levothyroxine (SYNTHROID) 112 MCG tablet, Take 1 tablet (112 mcg total) by mouth daily. (Patient taking differently: Take 112 mcg by mouth daily before breakfast.), Disp: 90 tablet, Rfl: 1   loratadine (CLARITIN) 10 MG tablet, Take 10 mg by mouth daily as needed for allergies., Disp: , Rfl:    magnesium oxide (MAG-OX) 400 MG tablet, Take 400 mg by mouth every evening., Disp: , Rfl:    Multiple Vitamin (MULTIVITAMIN) tablet, Take 1 tablet by mouth every evening., Disp: , Rfl:    nitrofurantoin, macrocrystal-monohydrate, (MACROBID) 100 MG capsule, Take 1 capsule (100 mg total) by mouth 2 (two) times daily for 7 days., Disp: 14  capsule, Rfl: 0   polyethylene glycol (MIRALAX / GLYCOLAX) 17 g packet, Take 17 g by mouth in the morning and at bedtime., Disp: , Rfl:    psyllium (METAMUCIL) 58.6 % packet, Take 1 packet by mouth in the morning., Disp: , Rfl:    sucralfate (CARAFATE) 1 g tablet, Take 1 tablet (1 g total) by mouth 2 (two) times daily., Disp: 180 tablet, Rfl: 1   tiZANidine (ZANAFLEX) 4 MG tablet, TAKE 1 TABLET BY MOUTH THREE TIMES DAILY, Disp: 90 tablet, Rfl: 0   traZODone (DESYREL) 100 MG tablet, Take 1 tablet (100 mg total) by mouth at bedtime as needed and may repeat dose one time if needed for sleep., Disp: 60 tablet, Rfl: 5   venlafaxine (EFFEXOR) 100 MG tablet, Take 1 tablet (100 mg total) by mouth 3 (three) times daily with meals., Disp: 270 tablet, Rfl: 2   vitamin C (ASCORBIC ACID) 500 MG tablet, Take 500 mg by mouth in the morning., Disp: , Rfl:    zinc gluconate 50 MG tablet, Take 50 mg by mouth in the morning., Disp: , Rfl:   Physical exam:  Vitals:   01/10/22 1408 01/10/22 1423  BP:  126/60  Pulse:  66  Resp:  18  SpO2:  99%  Weight: (!) 352 lb (159.7 kg)   Height: 5' 5.3" (1.659 m)    Physical Exam Constitutional:      General: She is not in acute distress.    Appearance: She is well-developed. She is not ill-appearing.  HENT:     Head: Atraumatic.     Nose: Nose normal.     Mouth/Throat:     Pharynx: No oropharyngeal exudate.  Eyes:     General: No scleral icterus.    Conjunctiva/sclera: Conjunctivae normal.  Cardiovascular:     Rate and Rhythm: Normal rate and regular rhythm.  Pulmonary:     Effort: Pulmonary effort is normal.     Breath sounds: Normal breath sounds.  Abdominal:     General: Bowel sounds are normal. There is no distension.  Palpations: Abdomen is soft.     Tenderness: There is no abdominal tenderness.  Musculoskeletal:        General: No tenderness or deformity. Normal range of motion.     Cervical back: Normal range of motion and neck supple.  Skin:     General: Skin is warm and dry.     Coloration: Skin is not pale.  Neurological:     General: No focal deficit present.     Mental Status: She is alert and oriented to person, place, and time.  Psychiatric:        Mood and Affect: Mood normal.        Behavior: Behavior normal.         Latest Ref Rng & Units 01/10/2022    1:46 PM  CMP  Glucose 70 - 99 mg/dL 91   BUN 8 - 23 mg/dL 22   Creatinine 0.44 - 1.00 mg/dL 1.00   Sodium 135 - 145 mmol/L 137   Potassium 3.5 - 5.1 mmol/L 3.8   Chloride 98 - 111 mmol/L 104   CO2 22 - 32 mmol/L 22   Calcium 8.9 - 10.3 mg/dL 9.1   Total Protein 6.5 - 8.1 g/dL 7.2   Total Bilirubin 0.3 - 1.2 mg/dL 0.4   Alkaline Phos 38 - 126 U/L 105   AST 15 - 41 U/L 23   ALT 0 - 44 U/L 16       Latest Ref Rng & Units 01/10/2022    1:54 PM  CBC  WBC 4.0 - 10.5 K/uL 4.9   Hemoglobin 12.0 - 15.0 g/dL 12.0   Hematocrit 36.0 - 46.0 % 37.8   Platelets 150 - 400 K/uL 208     No images are attached to the encounter.  No results found.    Assessment and plan- Patient is a 61 y.o. female B12 deficiency anemia secondary to gastric bypass who returns to routine follow-up.   Anemia: Secondary to gastric bypass surgery. Hemoglobin continues to be stable. Iron studies are pending at time of visit but are overall stable. Plan is to continue to monitor.  B12 deficiency-Chronic. Continue receiving monthly B12 injections.  Malabsorption secondary to gastric bypass.   Plan: Monthly b12 3 mo- lab (cbc, ferritin, iron studies, b12, folate) 6 mo- lab (cbc, ferritin, iron studies, b12, folate), MD/APP- [place]   Visit Diagnosis 1. Symptomatic anemia   2. B12 deficiency    Nelwyn Salisbury [facility] [phone] (clinic) 01/11/2022

## 2022-01-11 NOTE — Progress Notes (Signed)
`  Psychiatric Initial Adult Assessment   Patient Identification: Jamie Bennett MRN:  [id number] Date of Evaluation:  01/11/2022 Referral Source: grams per previous psychiatrist Chief Complaint:   Visit Diagnosis: borderline personality disorder   ICD-10-CM   1. Anxiety state  F41.1 busPIRone (BUSPAR) 30 MG tablet    clonazePAM (KLONOPIN) 0.5 MG tablet    hydrOXYzine (ATARAX) 50 MG tablet    venlafaxine (EFFEXOR) 100 MG tablet    2. Borderline personality disorder (HCC)  F60.3 lamoTRIgine (LAMICTAL) 150 MG tablet    venlafaxine (EFFEXOR) 100 MG tablet    3. Major depressive disorder, recurrent, severe without psychotic features ([place])  F33.2 lamoTRIgine (LAMICTAL) 150 MG tablet    venlafaxine (EFFEXOR) 100 MG tablet       History of Pre   Today the patient is actually doing pretty well.  She is getting over the death of her mother.  The patient is still recovering from knee surgery.  She is using a cane.  She is planning to go to [employer] [facility].  She is morbidly obese.  She takes multiple number of meds psychiatric medicines but actually she is very stable from a psychiatric point of view.  She denies daily depression.  She denies anhedonia.  She is sleeping and eating well and has reasonably good amount of energy and she can think and concentrate without a problem.  She has never been psychotic.  She does not drink alcohol or use any drugs.  Her anxiety is well controlled with BuSpar and Vistaril.  Financially she is very stable.  She has a reasonably good relationship with her husband of 68 years.  The patient actually is very stable.  She was given some therapist to do DBT but they have not quite worked out yet.  She will consider calling some of them back.  But overall she emotionally is quite stable.  She is functioning well.  She is engaging and friendly.  Depression Symptoms:  fatigue, (Hypo) Manic Symptoms:   Anxiety Symptoms:   Psychotic Symptoms:   PTSD  Symptoms:   Past Psychiatric History: 10 psychiatric hospitalizations multiple psychotropic medications presently in psychotherapy  Previous Psychotropic Medications: Yes   Substance Abuse History in the last 12 months:  Yes.    Consequences of Substance Abuse:   Past Medical History:  Past Medical History:  Diagnosis Date   Anemia    Anxiety    Bipolar 1 disorder ([place])    Collagen vascular disease ([place])    rhematoid arthritis   Constipation    Dyspnea    Fibromyalgia    GERD (gastroesophageal reflux disease)    Heart murmur    mild-asymptomatic   Hepatic steatosis    History of kidney stones    History of Roux-en-Y gastric bypass    Hypotension    Hypothyroidism    Morbid obesity with BMI of 50.0-59.9, adult ([place])    Opioid abuse ([place])    Osteoarthritis    Osteoporosis    PONV (postoperative nausea and vomiting)    nausea only   Rheumatic fever    Rheumatoid arthritis ([place])    Small bowel obstruction ([place])    Thyroid disease     Past Surgical History:  Procedure Laterality Date   CHOLECYSTECTOMY  2004   COLONOSCOPY N/A 07/16/2017   Procedure: COLONOSCOPY;  Surgeon: Manya Silvas, MD;  Location: [facility] ENDOSCOPY;  Service: Endoscopy;  Laterality: N/A;   EXPLORATORY LAPAROTOMY  2009   GASTRIC BYPASS  2003  [facility]   HEEL SPUR EXCISION Bilateral    KNEE ARTHROSCOPY WITH SUBCHONDROPLASTY Left 09/27/2021   Procedure: Left knee subchondroplasty medial tibial plateau;  Surgeon: Hessie Knows, MD;  Location: [facility] ORS;  Service: Orthopedics;  Laterality: Left;   LAPAROTOMY N/A 03/01/2018   Procedure: EXPLORATORY LAPAROTOMY/ UMBILECTOMY;  Surgeon: Robert Bellow, MD;  Location: [facility] ORS;  Service: General;  Laterality: N/A;   TONSILLECTOMY     VENTRAL HERNIA REPAIR N/A 03/07/2018   Procedure: HERNIA REPAIR VENTRAL ADULT;  Surgeon: Robert Bellow, MD;  Location: [facility] ORS;  Service: General;  Laterality: N/A;    Family Psychiatric History:   Family  History:  Family History  Problem Relation Age of Onset   Depression Mother    Dementia Mother    Heart disease Mother    Osteoporosis Mother    Parkinson's disease Father    Liver disease Brother    Colon cancer Neg Hx     Social History:   Social History   Socioeconomic History   Marital status: Married    Spouse name: Not on file   Number of children: Not on file   Years of education: Not on file   Highest education level: Not on file  Occupational History   Not on file  Tobacco Use   Smoking status: Never    Passive exposure: Past   Smokeless tobacco: Never  Vaping Use   Vaping Use: Never used  Substance and Sexual Activity   Alcohol use: Not Currently   Drug use: No   Sexual activity: Yes  Other Topics Concern   Not on file  Social History Narrative   Not on file   Social Determinants of Health   Financial Resource Strain: Not on file  Food Insecurity: Not on file  Transportation Needs: Not on file  Physical Activity: Not on file  Stress: Not on file  Social Connections: Not on file    Additional Social History:   Allergies:   Allergies  Allergen Reactions   Lactose Intolerance (Gi) Other (See Comments)    Bloating and GI distress   Sulfa Antibiotics Hives    Metabolic Disorder Labs: Lab Results  Component Value Date   HGBA1C 5.0 09/01/2021   No results found for: "PROLACTIN" Lab Results  Component Value Date   CHOL 169 05/26/2020   TRIG 136 05/26/2020   HDL 73 05/26/2020   LDLCALC 73 05/26/2020     Current Medications: Current Outpatient Medications  Medication Sig Dispense Refill   acetaminophen (TYLENOL) 500 MG tablet Take 500-1,000 mg by mouth every 6 (six) hours as needed (pain.).     albuterol (VENTOLIN HFA) 108 (90 Base) MCG/ACT inhaler Inhale 2 puffs into the lungs every 6 (six) hours as needed for wheezing or shortness of breath. 8 g 0   busPIRone (BUSPAR) 30 MG tablet 1 bid 180 tablet 2   Calcium Carbonate (CALCIUM 600 PO)  Take 600 mg by mouth in the morning and at bedtime.     Cholecalciferol 125 MCG (5000 UT) TABS Take 5,000 Units by mouth in the morning.     clonazePAM (KLONOPIN) 0.5 MG tablet TAKE 1 TABLET BY MOUTH ONCE DAILY AS NEEDED FOR  ACUTE  ANXIETY  EMERGENCIES  ONLY  DO  NOT  EXCEED  ONE  PER  DAY 35 tablet 5   Coenzyme Q10 300 MG CAPS Take 300 mg by mouth in the morning.     cyanocobalamin (,VITAMIN B-12,) 1000 MCG/ML injection Inject 1,000  mcg into the muscle every 30 (thirty) days.      denosumab (PROLIA) 60 MG/ML SOSY injection Inject 60 mg into the skin every 6 (six) months.     esomeprazole (NEXIUM) 20 MG capsule Take 20 mg by mouth in the morning.     estradiol (ESTRACE) 0.1 MG/GM vaginal cream Apply a pea-sized amount to index finger and wipe in vaginal introitus twice weekly 42.5 g 12   gabapentin (NEURONTIN) 100 MG capsule TAKE 2 CAPSULES BY MOUTH THREE TIMES DAILY 180 capsule 1   HYDROcodone-acetaminophen (NORCO) 7.5-325 MG tablet Take 1 tablet by mouth every 6 (six) hours as needed for moderate pain. 30 tablet 0   hydroxychloroquine (PLAQUENIL) 200 MG tablet TAKE ONE TABLET BY MOUTH TWICE A DAY 180 tablet 0   hydrOXYzine (ATARAX) 50 MG tablet Take 1 tablet (50 mg total) by mouth 3 (three) times daily as needed for anxiety. 120 tablet 4   Krill Oil 500 MG CAPS Take 500 mg by mouth daily at 12 noon.     lamoTRIgine (LAMICTAL) 150 MG tablet Take 1 tablet (150 mg total) by mouth 2 (two) times daily. 68 tablet 0   levothyroxine (SYNTHROID) 112 MCG tablet Take 1 tablet (112 mcg total) by mouth daily. (Patient taking differently: Take 112 mcg by mouth daily before breakfast.) 90 tablet 1   loratadine (CLARITIN) 10 MG tablet Take 10 mg by mouth daily as needed for allergies.     magnesium oxide (MAG-OX) 400 MG tablet Take 400 mg by mouth every evening.     Multiple Vitamin (MULTIVITAMIN) tablet Take 1 tablet by mouth every evening.     nitrofurantoin, macrocrystal-monohydrate, (MACROBID) 100 MG  capsule Take 1 capsule (100 mg total) by mouth 2 (two) times daily for 7 days. 14 capsule 0   polyethylene glycol (MIRALAX / GLYCOLAX) 17 g packet Take 17 g by mouth in the morning and at bedtime.     psyllium (METAMUCIL) 58.6 % packet Take 1 packet by mouth in the morning.     sucralfate (CARAFATE) 1 g tablet Take 1 tablet (1 g total) by mouth 2 (two) times daily. 180 tablet 1   tiZANidine (ZANAFLEX) 4 MG tablet TAKE 1 TABLET BY MOUTH THREE TIMES DAILY 90 tablet 0   traZODone (DESYREL) 100 MG tablet Take 1 tablet (100 mg total) by mouth at bedtime as needed and may repeat dose one time if needed for sleep. 60 tablet 5   venlafaxine (EFFEXOR) 100 MG tablet Take 1 tablet (100 mg total) by mouth 3 (three) times daily with meals. 270 tablet 2   vitamin C (ASCORBIC ACID) 500 MG tablet Take 500 mg by mouth in the morning.     zinc gluconate 50 MG tablet Take 50 mg by mouth in the morning.     No current facility-administered medications for this visit.    Neurologic: Headache: No Seizure: No Paresthesias:NA  Musculoskeletal: Strength & Muscle Tone: within normal limits Gait & Station: normal Patient leans: N/A  Psychiatric Specialty Exam: ROS  There were no vitals taken for this visit.There is no height or weight on file to calculate BMI.  General Appearance: Bizarre  Eye Contact:  Good  Speech:  Normal Rate  Volume:  Normal  Mood:  Anxious  Affect:  Congruent  Thought Process:  Goal Directed  Orientation:  NA  Thought Content:  Logical  Suicidal Thoughts:  No  Homicidal Thoughts:  No  Memory:  Negative  Judgement:  Fair  Insight:  NA  and Good  Psychomotor Activity:  Normal  Concentration:    Recall:  Good  Fund of Knowledge:  Language: Good  Akathisia:  No  Handed:  Right  AIMS (if indicated):    Assets:  Desire for Improvement  ADL's:  Intact  Cognition: WNL  Sleep:      Treatment Plan Summary:  This patient is diagnosed with borderline personality disorder and  major depression.  Her depression is fairly well controlled on Effexor and desipramine.  She has an adjustment disorder with an anxious mood state and takes BuSpar and Vistaril and does well.  I believe she takes Lamictal but is mainly as adjunct treatment for her depression.  The patient actually is very stable.  She will return to see me in 4 months. 6/14/20234:22 PM

## 2022-01-11 NOTE — Telephone Encounter (Signed)
Pt states she is still having pain and burning with urination after two doses of antibiotics. As per Debroah Loop PAC pt needs a CT renal study and a cystoscopy with Dr. Bernardo Heater. Order in. Pt scheduled. Pt aware.

## 2022-01-12 ENCOUNTER — Other Ambulatory Visit: Payer: Medicare HMO

## 2022-01-21 ENCOUNTER — Other Ambulatory Visit: Payer: Self-pay | Admitting: Nurse Practitioner

## 2022-01-21 DIAGNOSIS — M542 Cervicalgia: Secondary | ICD-10-CM

## 2022-01-21 DIAGNOSIS — G6189 Other inflammatory polyneuropathies: Secondary | ICD-10-CM

## 2022-01-23 ENCOUNTER — Ambulatory Visit
Admission: RE | Admit: 2022-01-23 | Discharge: 2022-01-23 | Disposition: A | Discharge status: Home / Self Care | Payer: Medicare HMO | Source: Ambulatory Visit | Attending: Physician Assistant | Admitting: Physician Assistant

## 2022-01-23 DIAGNOSIS — Z792 Long term (current) use of antibiotics: Secondary | ICD-10-CM | POA: Insufficient documentation

## 2022-01-23 DIAGNOSIS — Z87440 Personal history of urinary (tract) infections: Secondary | ICD-10-CM | POA: Insufficient documentation

## 2022-01-23 DIAGNOSIS — N390 Urinary tract infection, site not specified: Secondary | ICD-10-CM | POA: Insufficient documentation

## 2022-01-23 DIAGNOSIS — Z9884 Bariatric surgery status: Secondary | ICD-10-CM | POA: Diagnosis not present

## 2022-01-23 DIAGNOSIS — K6389 Other specified diseases of intestine: Secondary | ICD-10-CM | POA: Diagnosis not present

## 2022-01-27 ENCOUNTER — Encounter: Payer: Self-pay | Admitting: Urology

## 2022-01-27 ENCOUNTER — Ambulatory Visit: Payer: Medicare HMO | Admitting: Urology

## 2022-01-27 VITALS — BP 127/79 | HR 76 | Ht 65.3 in | Wt 348.0 lb

## 2022-01-27 DIAGNOSIS — N390 Urinary tract infection, site not specified: Secondary | ICD-10-CM

## 2022-01-27 DIAGNOSIS — R300 Dysuria: Secondary | ICD-10-CM | POA: Diagnosis not present

## 2022-01-27 DIAGNOSIS — Z87440 Personal history of urinary (tract) infections: Secondary | ICD-10-CM | POA: Diagnosis not present

## 2022-01-27 LAB — URINALYSIS, COMPLETE
Bilirubin, UA: NEGATIVE
Glucose, UA: NEGATIVE
Ketones, UA: NEGATIVE
Leukocytes,UA: NEGATIVE
Nitrite, UA: NEGATIVE
RBC, UA: NEGATIVE
Specific Gravity, UA: 1.03 (ref 1.005–1.030)
Urobilinogen, Ur: 0.2 mg/dL (ref 0.2–1.0)
pH, UA: 5.5 (ref 5.0–7.5)

## 2022-01-27 LAB — MICROSCOPIC EXAMINATION

## 2022-01-27 MED ORDER — NITROFURANTOIN MACROCRYSTAL 50 MG PO CAPS: 50.0000 mg | ORAL_CAPSULE | Freq: Every day | ORAL | 1 refills | Status: DC

## 2022-01-27 NOTE — Progress Notes (Signed)
   01/27/22  CC:  Chief Complaint  Patient presents with   Cysto    HPI: Recurrent UTI.  See Sam Vaillancourt's prior note.  Having mild dysuria.  Urinalysis today unremarkable.  Noncontrast CT abdomen pelvis without GU abnormalities  See rooming tab for vitals NED. A&Ox3.   No respiratory distress   Abd soft, NT, ND Atrophic external genitalia with patent urethral meatus  Cystoscopy Procedure Note  Patient identification was confirmed, informed consent was obtained, and patient was prepped using Betadine solution.  Lidocaine jelly was administered per urethral meatus.    Procedure: - Flexible cystoscope introduced, without any difficulty.   - Thorough search of the bladder revealed:    normal urethral meatus    normal urothelium    no stones    no ulcers     no tumors    no urethral polyps    no trabeculation  - Ureteral orifices were normal in position and appearance.  Post-Procedure: - Patient tolerated the procedure well  Assessment/ Plan: No abnormalities identified CT or cystoscopy She did well on low-dose antibiotic prophylaxis and had recurrent infection once stopping Restart low-dose antibiotic prophylaxis with nitrofurantoin PA follow-up 6 months or earlier for UTI symptoms Azo as needed for dysuria   Abbie Sons, MD

## 2022-01-29 ENCOUNTER — Other Ambulatory Visit: Payer: Self-pay | Admitting: Physician Assistant

## 2022-01-29 DIAGNOSIS — M0579 Rheumatoid arthritis with rheumatoid factor of multiple sites without organ or systems involvement: Secondary | ICD-10-CM

## 2022-01-30 NOTE — Telephone Encounter (Signed)
Next Visit: 03/22/2022  Last Visit: 10/20/2021  Labs: 01/10/2022, TIBC 458   Eye exam: 03/24/2021   Current Dose per office note 10/20/2021: Plaquenil 200 mg 1 tablet by mouth twice daily.    DX: Rheumatoid arthritis involving multiple sites with positive rheumatoid factor   Last Fill: 11/02/2021  Okay to refill Plaquenil?

## 2022-02-01 DIAGNOSIS — M1712 Unilateral primary osteoarthritis, left knee: Secondary | ICD-10-CM | POA: Diagnosis not present

## 2022-02-10 ENCOUNTER — Inpatient Hospital Stay: Payer: Medicare HMO | Attending: Oncology

## 2022-02-10 DIAGNOSIS — Z79899 Other long term (current) drug therapy: Secondary | ICD-10-CM | POA: Insufficient documentation

## 2022-02-10 DIAGNOSIS — E538 Deficiency of other specified B group vitamins: Secondary | ICD-10-CM | POA: Insufficient documentation

## 2022-02-10 MED ORDER — CYANOCOBALAMIN 1000 MCG/ML IJ SOLN
1000.0000 ug | Freq: Once | INTRAMUSCULAR | Status: AC
  Administered 2022-02-10: 1000 ug via INTRAMUSCULAR
  Filled 2022-02-10: qty 1

## 2022-02-13 DIAGNOSIS — M1712 Unilateral primary osteoarthritis, left knee: Secondary | ICD-10-CM | POA: Diagnosis not present

## 2022-02-18 ENCOUNTER — Other Ambulatory Visit: Payer: Self-pay | Admitting: Nurse Practitioner

## 2022-02-18 DIAGNOSIS — G6189 Other inflammatory polyneuropathies: Secondary | ICD-10-CM

## 2022-02-18 DIAGNOSIS — M542 Cervicalgia: Secondary | ICD-10-CM

## 2022-02-19 ENCOUNTER — Other Ambulatory Visit (HOSPITAL_COMMUNITY): Payer: Self-pay | Admitting: Psychiatry

## 2022-02-22 ENCOUNTER — Other Ambulatory Visit (HOSPITAL_COMMUNITY): Payer: Self-pay | Admitting: *Deleted

## 2022-02-22 MED ORDER — DESIPRAMINE HCL 25 MG PO TABS: 25.0000 mg | ORAL_TABLET | Freq: Every day | ORAL | 0 refills | Status: DC

## 2022-02-23 ENCOUNTER — Other Ambulatory Visit (HOSPITAL_COMMUNITY): Payer: Self-pay | Admitting: *Deleted

## 2022-02-23 DIAGNOSIS — F411 Generalized anxiety disorder: Secondary | ICD-10-CM

## 2022-02-23 MED ORDER — CLONAZEPAM 0.5 MG PO TABS: ORAL_TABLET | ORAL | 5 refills | Status: DC

## 2022-02-24 DIAGNOSIS — M810 Age-related osteoporosis without current pathological fracture: Secondary | ICD-10-CM | POA: Diagnosis not present

## 2022-03-02 ENCOUNTER — Encounter: Payer: Self-pay | Admitting: Nurse Practitioner

## 2022-03-02 ENCOUNTER — Ambulatory Visit (INDEPENDENT_AMBULATORY_CARE_PROVIDER_SITE_OTHER): Payer: Medicare HMO | Admitting: Nurse Practitioner

## 2022-03-02 VITALS — BP 120/80 | HR 70 | Temp 98.3°F | Resp 16 | Ht 65.0 in | Wt 356.8 lb

## 2022-03-02 DIAGNOSIS — E039 Hypothyroidism, unspecified: Secondary | ICD-10-CM | POA: Diagnosis not present

## 2022-03-02 DIAGNOSIS — G6189 Other inflammatory polyneuropathies: Secondary | ICD-10-CM

## 2022-03-02 DIAGNOSIS — R062 Wheezing: Secondary | ICD-10-CM | POA: Diagnosis not present

## 2022-03-02 DIAGNOSIS — M25562 Pain in left knee: Secondary | ICD-10-CM | POA: Diagnosis not present

## 2022-03-02 DIAGNOSIS — G8929 Other chronic pain: Secondary | ICD-10-CM

## 2022-03-02 DIAGNOSIS — Z6841 Body Mass Index (BMI) 40.0 and over, adult: Secondary | ICD-10-CM

## 2022-03-02 DIAGNOSIS — B372 Candidiasis of skin and nail: Secondary | ICD-10-CM | POA: Diagnosis not present

## 2022-03-02 DIAGNOSIS — M542 Cervicalgia: Secondary | ICD-10-CM

## 2022-03-02 DIAGNOSIS — Z760 Encounter for issue of repeat prescription: Secondary | ICD-10-CM

## 2022-03-02 MED ORDER — MUPIROCIN 2 % EX OINT: 1.0000 | TOPICAL_OINTMENT | Freq: Two times a day (BID) | CUTANEOUS | 3 refills | Status: DC

## 2022-03-02 MED ORDER — LEVOTHYROXINE SODIUM 112 MCG PO TABS: 112.0000 ug | ORAL_TABLET | Freq: Every day | ORAL | 1 refills | Status: DC

## 2022-03-02 MED ORDER — TIZANIDINE HCL 4 MG PO TABS: 4.0000 mg | ORAL_TABLET | Freq: Three times a day (TID) | ORAL | 1 refills | Status: DC

## 2022-03-02 MED ORDER — NYSTATIN 100000 UNIT/GM EX POWD: 1.0000 | Freq: Two times a day (BID) | CUTANEOUS | 6 refills | Status: DC

## 2022-03-02 MED ORDER — ALBUTEROL SULFATE HFA 108 (90 BASE) MCG/ACT IN AERS: 2.0000 | INHALATION_SPRAY | Freq: Four times a day (QID) | RESPIRATORY_TRACT | 3 refills | Status: DC | PRN

## 2022-03-02 NOTE — Progress Notes (Signed)
[facility] [place], [place] [address]  Internal MEDICINE  Office Visit Note  Patient Name: Jamie Bennett  [id number]  [id number]  Date of Service: 03/02/2022  Chief Complaint  Patient presents with   Follow-up    Rash under breast and belly, inhaler needed pt having wheezing    Gastroesophageal Reflux   Anxiety   Anemia    HPI Jamie Bennett presents for a follow up visit for hypothyroidism, left knee pain, wheezing and a rash under breast Hypothyroidism -- controlled, on levothyroxine 112 mcg daily. Sees Dr. Honor Junes with [facility] at [facility].  Left knee pain -- Left knee surgery in February with cement--patella was cracked. , recent gel injection. Patella is too movable, knee joint is bone on bone with spurs. Using cane, knee hurts if she is weight bearing on her knee too long or twists the knee. Hurts more at night ,reports nothing helps. Ice and heat do not help. Tramadol helps some. Her specialist says she needs to have knee replacement surgery but they will not schedule the surgery until she loses weight. She is working with bariatric surgery now.  Wheezing -- requesting albuterol inhaler. Wheezing intermittently. Has prior diagnosis of exercise-induced asthma. Last spiro was normal in may last year.  Rash under breasts -- itchy, patient thinks it is a yeast rash  --Reports slow moving bowels, managed with metamucil and miralax --is only taking 1 capsule of gabapentin TID instead of 2 capsules TID. 2 capsules TID was making her too sleepy.    Current Medication: Outpatient Encounter Medications as of 03/02/2022  Medication Sig Note   acetaminophen (TYLENOL) 500 MG tablet Take 500-1,000 mg by mouth every 6 (six) hours as needed (pain.).    busPIRone (BUSPAR) 30 MG tablet 1 bid    Calcium Carbonate (CALCIUM 600 PO) Take 600 mg by mouth in the morning and at bedtime.    Cholecalciferol 125 MCG (5000 UT) TABS Take 5,000 Units by mouth in  the morning.    clonazePAM (KLONOPIN) 0.5 MG tablet TAKE 1 TABLET BY MOUTH ONCE DAILY AS NEEDED FOR  ACUTE  ANXIETY. Pt may take 1 extra tablet (0.5 mg) PRN as needed for severe anxiety.    Coenzyme Q10 300 MG CAPS Take 300 mg by mouth in the morning.    cyanocobalamin (,VITAMIN B-12,) 1000 MCG/ML injection Inject 1,000 mcg into the muscle every 30 (thirty) days.     denosumab (PROLIA) 60 MG/ML SOSY injection Inject 60 mg into the skin every 6 (six) months. 09/01/2021: Last dose: 07/2021   desipramine (NORPRAMIN) 25 MG tablet Take 1 tablet (25 mg total) by mouth daily.    esomeprazole (NEXIUM) 20 MG capsule Take 20 mg by mouth in the morning.    estradiol (ESTRACE) 0.1 MG/GM vaginal cream Apply a pea-sized amount to index finger and wipe in vaginal introitus twice weekly    gabapentin (NEURONTIN) 100 MG capsule TAKE 2 CAPSULES BY MOUTH THREE TIMES DAILY    hydroxychloroquine (PLAQUENIL) 200 MG tablet TAKE ONE TABLET BY MOUTH TWICE A DAY    hydrOXYzine (ATARAX) 50 MG tablet Take 1 tablet (50 mg total) by mouth 3 (three) times daily as needed for anxiety.    Krill Oil 500 MG CAPS Take 500 mg by mouth daily at 12 noon.    lamoTRIgine (LAMICTAL) 150 MG tablet Take 1 tablet (150 mg total) by mouth 2 (two) times daily.    loratadine (CLARITIN) 10 MG tablet Take 10 mg by  mouth daily as needed for allergies.    magnesium oxide (MAG-OX) 400 MG tablet Take 400 mg by mouth every evening.    Multiple Vitamin (MULTIVITAMIN) tablet Take 1 tablet by mouth every evening.    mupirocin ointment (BACTROBAN) 2 % Apply 1 Application topically 2 (two) times daily.    nitrofurantoin (MACRODANTIN) 50 MG capsule Take 1 capsule (50 mg total) by mouth daily.    nystatin (MYCOSTATIN/NYSTOP) powder Apply 1 Application topically 2 (two) times daily.    polyethylene glycol (MIRALAX / GLYCOLAX) 17 g packet Take 17 g by mouth in the morning and at bedtime.    psyllium (METAMUCIL) 58.6 % packet Take 1 packet by mouth in the  morning.    sucralfate (CARAFATE) 1 g tablet Take 1 tablet (1 g total) by mouth 2 (two) times daily.    traMADol (ULTRAM) 50 MG tablet Take 100 mg by mouth every 6 (six) hours as needed.    traZODone (DESYREL) 100 MG tablet Take 1 tablet (100 mg total) by mouth at bedtime as needed and may repeat dose one time if needed for sleep.    venlafaxine (EFFEXOR) 100 MG tablet Take 1 tablet (100 mg total) by mouth 3 (three) times daily with meals.    vitamin C (ASCORBIC ACID) 500 MG tablet Take 500 mg by mouth in the morning.    zinc gluconate 50 MG tablet Take 50 mg by mouth in the morning.    [DISCONTINUED] albuterol (VENTOLIN HFA) 108 (90 Base) MCG/ACT inhaler Inhale 2 puffs into the lungs every 6 (six) hours as needed for wheezing or shortness of breath.    [DISCONTINUED] levothyroxine (SYNTHROID) 112 MCG tablet Take 1 tablet (112 mcg total) by mouth daily. (Patient taking differently: Take 112 mcg by mouth daily before breakfast.)    [DISCONTINUED] tiZANidine (ZANAFLEX) 4 MG tablet TAKE 1 TABLET BY MOUTH THREE TIMES DAILY    albuterol (VENTOLIN HFA) 108 (90 Base) MCG/ACT inhaler Inhale 2 puffs into the lungs every 6 (six) hours as needed for wheezing or shortness of breath.    levothyroxine (SYNTHROID) 112 MCG tablet Take 1 tablet (112 mcg total) by mouth daily before breakfast.    tiZANidine (ZANAFLEX) 4 MG tablet Take 1 tablet (4 mg total) by mouth 3 (three) times daily.    No facility-administered encounter medications on file as of 03/02/2022.    Surgical History: Past Surgical History:  Procedure Laterality Date   CHOLECYSTECTOMY  2004   COLONOSCOPY N/A 07/16/2017   Procedure: COLONOSCOPY;  Surgeon: Manya Silvas, MD;  Location: [facility] ENDOSCOPY;  Service: Endoscopy;  Laterality: N/A;   EXPLORATORY LAPAROTOMY  2009   GASTRIC BYPASS  2003   [facility]   HEEL SPUR EXCISION Bilateral    KNEE ARTHROSCOPY WITH SUBCHONDROPLASTY Left 09/27/2021   Procedure: Left knee subchondroplasty  medial tibial plateau;  Surgeon: Hessie Knows, MD;  Location: [facility] ORS;  Service: Orthopedics;  Laterality: Left;   LAPAROTOMY N/A 03/01/2018   Procedure: EXPLORATORY LAPAROTOMY/ UMBILECTOMY;  Surgeon: Robert Bellow, MD;  Location: [facility] ORS;  Service: General;  Laterality: N/A;   TONSILLECTOMY     VENTRAL HERNIA REPAIR N/A 03/07/2018   Procedure: HERNIA REPAIR VENTRAL ADULT;  Surgeon: Robert Bellow, MD;  Location: [facility] ORS;  Service: General;  Laterality: N/A;    Medical History: Past Medical History:  Diagnosis Date   Anemia    Anxiety    Bipolar 1 disorder ([place])    Collagen vascular disease ([place])    rhematoid arthritis  Constipation    Dyspnea    Fibromyalgia    GERD (gastroesophageal reflux disease)    Heart murmur    mild-asymptomatic   Hepatic steatosis    History of kidney stones    History of Roux-en-Y gastric bypass    Hypotension    Hypothyroidism    Morbid obesity with BMI of 50.0-59.9, adult (HCC)    Opioid abuse (HCC)    Osteoarthritis    Osteoporosis    PONV (postoperative nausea and vomiting)    nausea only   Rheumatic fever    Rheumatoid arthritis (HCC)    Small bowel obstruction (HCC)    Thyroid disease     Family History: Family History  Problem Relation Age of Onset   Depression Mother    Dementia Mother    Heart disease Mother    Osteoporosis Mother    Parkinson's disease Father    Liver disease Brother    Colon cancer Neg Hx     Social History   Socioeconomic History   Marital status: Married    Spouse name: Not on file   Number of children: Not on file   Years of education: Not on file   Highest education level: Not on file  Occupational History   Not on file  Tobacco Use   Smoking status: Never    Passive exposure: Past   Smokeless tobacco: Never  Vaping Use   Vaping Use: Never used  Substance and Sexual Activity   Alcohol use: Not Currently   Drug use: No   Sexual activity: Yes  Other Topics Concern   Not on  file  Social History Narrative   Not on file   Social Determinants of Health   Financial Resource Strain: Not on file  Food Insecurity: Not on file  Transportation Needs: Not on file  Physical Activity: Not on file  Stress: Not on file  Social Connections: Not on file  Intimate Partner Violence: Not on file      Review of Systems  Constitutional:  Negative for chills and fever.  HENT:  Positive for congestion, postnasal drip and rhinorrhea.   Respiratory:  Positive for wheezing (intermittent). Negative for cough, chest tightness and shortness of breath.   Cardiovascular: Negative.  Negative for chest pain and palpitations.  Gastrointestinal: Negative.  Negative for abdominal pain, constipation, diarrhea, nausea and vomiting.  Genitourinary: Negative.  Negative for dysuria, frequency and urgency.  Musculoskeletal:  Positive for arthralgias (left knee pain) and joint swelling. Negative for back pain and neck pain.  Skin:  Positive for rash (under breast bilaterally, itchy and raw feeling).  Allergic/Immunologic: Positive for environmental allergies.  Neurological: Negative.   Psychiatric/Behavioral:  Negative for sleep disturbance. Behavioral problem: Depression.The patient is not nervous/anxious.     Vital Signs: BP 120/80   Pulse 70   Temp 98.3 F (36.8 C)   Resp 16   Ht '5\' 5"'$  (1.651 m)   Wt (!) 356 lb 12.8 oz (161.8 kg)   SpO2 99%   BMI 59.37 kg/m    Physical Exam Vitals reviewed.  Constitutional:      General: She is awake. She is not in acute distress.    Appearance: Normal appearance. She is morbidly obese. She is not ill-appearing.  HENT:     Head: Normocephalic and atraumatic.     Nose: Congestion present.  Cardiovascular:     Rate and Rhythm: Normal rate and regular rhythm.     Heart sounds: Normal heart sounds, S1 normal  and S2 normal. No murmur heard. Pulmonary:     Effort: Pulmonary effort is normal. No respiratory distress.     Breath sounds: Normal  breath sounds. No wheezing.  Musculoskeletal:     Left knee: Swelling and effusion present. Decreased range of motion. Tenderness present. Abnormal patellar mobility.  Neurological:     Mental Status: She is alert and oriented to person, place, and time.  Psychiatric:        Behavior: Behavior is cooperative.        Assessment/Plan: 1. Acquired hypothyroidism Sees [facility] but needs refills today.  - levothyroxine (SYNTHROID) 112 MCG tablet; Take 1 tablet (112 mcg total) by mouth daily before breakfast.  Dispense: 90 tablet; Refill: 1  2. Chronic pain of left knee Muscle relaxant prescribed to help patient sleep at night with the knee pain. Continue to work with bariatric surgery to lose weight so that she can have the knee replacement surgery done.  - tiZANidine (ZANAFLEX) 4 MG tablet; Take 1 tablet (4 mg total) by mouth 3 (three) times daily.  Dispense: 90 tablet; Refill: 1  3. Wheezing without diagnosis of asthma Albuterol inhaler prescribed. Consider ordering repeat spiro next or PFT at next office visit if still having intermittent wheezing or if she develops additional symptoms.  - albuterol (VENTOLIN HFA) 108 (90 Base) MCG/ACT inhaler; Inhale 2 puffs into the lungs every 6 (six) hours as needed for wheezing or shortness of breath.  Dispense: 8 g; Refill: 3  4. Cutaneous candidiasis Nystatin powder prescribed to treat yeast rash under breast. Mupirocin ordered to apply to small open areas under her breast within the area of the rash. Continue to use both daily until rash is resolved. Refills ordered for possibility of recurrence in the future.  - mupirocin ointment (BACTROBAN) 2 %; Apply 1 Application topically 2 (two) times daily.  Dispense: 30 g; Refill: 3 - nystatin (MYCOSTATIN/NYSTOP) powder; Apply 1 Application topically 2 (two) times daily.  Dispense: 30 g; Refill: 6  5. Medication refill - levothyroxine (SYNTHROID) 112 MCG tablet; Take 1 tablet (112 mcg total) by mouth  daily before breakfast.  Dispense: 90 tablet; Refill: 1 - tiZANidine (ZANAFLEX) 4 MG tablet; Take 1 tablet (4 mg total) by mouth 3 (three) times daily.  Dispense: 90 tablet; Refill: 1  6. BMI 50.0-59.9, adult [facility]) Working with bariatric surgery, ortho said she has to lose weight before they will do the knee surgery.    General Counseling: rhegan trunnell understanding of the findings of todays visit and agrees with plan of treatment. I have discussed any further diagnostic evaluation that may be needed or ordered today. We also reviewed her medications today. she has been encouraged to call the office with any questions or concerns that should arise related to todays visit.    No orders of the defined types were placed in this encounter.   Meds ordered this encounter  Medications   levothyroxine (SYNTHROID) 112 MCG tablet    Sig: Take 1 tablet (112 mcg total) by mouth daily before breakfast.    Dispense:  90 tablet    Refill:  1   albuterol (VENTOLIN HFA) 108 (90 Base) MCG/ACT inhaler    Sig: Inhale 2 puffs into the lungs every 6 (six) hours as needed for wheezing or shortness of breath.    Dispense:  8 g    Refill:  3   tiZANidine (ZANAFLEX) 4 MG tablet    Sig: Take 1 tablet (4 mg total) by mouth 3 (  three) times daily.    Dispense:  90 tablet    Refill:  1   mupirocin ointment (BACTROBAN) 2 %    Sig: Apply 1 Application topically 2 (two) times daily.    Dispense:  30 g    Refill:  3   nystatin (MYCOSTATIN/NYSTOP) powder    Sig: Apply 1 Application topically 2 (two) times daily.    Dispense:  30 g    Refill:  6    Return in about 1 month (around 04/02/2022) for F/U, Ransome Helwig PCP med adjustment.   Total time spent:30 Minutes Time spent includes review of chart, medications, test results, and follow up plan with the patient.   [place] Controlled Substance Database was reviewed by me.  This patient was seen by Jonetta Osgood, FNP-C in collaboration with Dr. Clayborn Bigness as a part  of collaborative care agreement.   Aaria Happ R. Valetta Fuller, MSN, FNP-C Internal medicine

## 2022-03-06 ENCOUNTER — Other Ambulatory Visit (HOSPITAL_COMMUNITY): Payer: Self-pay | Admitting: Psychiatry

## 2022-03-06 DIAGNOSIS — F332 Major depressive disorder, recurrent severe without psychotic features: Secondary | ICD-10-CM

## 2022-03-06 DIAGNOSIS — F603 Borderline personality disorder: Secondary | ICD-10-CM

## 2022-03-08 ENCOUNTER — Other Ambulatory Visit (HOSPITAL_COMMUNITY): Payer: Self-pay | Admitting: *Deleted

## 2022-03-08 DIAGNOSIS — F603 Borderline personality disorder: Secondary | ICD-10-CM

## 2022-03-08 DIAGNOSIS — F332 Major depressive disorder, recurrent severe without psychotic features: Secondary | ICD-10-CM

## 2022-03-08 MED ORDER — TRAZODONE HCL 100 MG PO TABS: 100.0000 mg | ORAL_TABLET | Freq: Every evening | ORAL | 2 refills | Status: DC | PRN

## 2022-03-09 NOTE — Progress Notes (Signed)
Office Visit Note  Patient: Jamie Bennett             Date of Birth: 1961/06/24           MRN: [id number]             PCP: Lavera Guise, MD Referring: Mylinda Latina, PA* Visit Date: 03/22/2022 Occupation: '@GUAROCC'$ @  Subjective:  Pain in both hands  History of Present Illness: Jamie Bennett is a 61 y.o. female history of seropositive rheumatoid arthritis, osteoarthritis, fibromyalgia and osteoporosis.  She denies any increased joint swelling.  She continues to have pain and discomfort due to underlying osteoarthritis.  She has been seeing orthopedic surgeon at [facility] clinic for her left knee joint pain.  She continues to have left knee joint discomfort.  She had viscosupplement injection to her left knee joint last month.  She is currently not having neck or lower back pain.  She continues to have discomfort in her bilateral hands.  Activities of Daily Living:  Patient reports morning stiffness for 1-2 hours.   Patient Denies nocturnal pain.  Difficulty dressing/grooming: Denies Difficulty climbing stairs: Reports Difficulty getting out of chair: Reports Difficulty using hands for taps, buttons, cutlery, and/or writing: Denies  Review of Systems  Constitutional:  Positive for fatigue.  HENT:  Positive for mouth dryness. Negative for mouth sores.   Eyes:  Positive for dryness.  Respiratory:  Negative for shortness of breath.   Cardiovascular:  Negative for chest pain and palpitations.  Gastrointestinal:  Negative for blood in stool, constipation and diarrhea.  Endocrine: Negative for increased urination.  Genitourinary:  Negative for involuntary urination.  Musculoskeletal:  Positive for joint pain, gait problem, joint pain, joint swelling and morning stiffness. Negative for myalgias, muscle weakness, muscle tenderness and myalgias.  Skin:  Positive for hair loss. Negative for color change, rash and sensitivity to sunlight.  Allergic/Immunologic: Negative for  susceptible to infections.  Neurological:  Positive for headaches. Negative for dizziness.  Hematological:  Negative for swollen glands.  Psychiatric/Behavioral:  Positive for depressed mood. Negative for sleep disturbance. The patient is nervous/anxious.     PMFS History:  Patient Active Problem List   Diagnosis Date Noted   Neck pain, musculoskeletal 03/24/2020   Bipolar affective disorder, current episode mixed ([place]) 11/09/2019   Encounter for general adult medical examination with abnormal findings 06/15/2019   Closed fracture of left elbow 06/15/2019   Urinary tract infection without hematuria 06/15/2019   Encounter for long-term (current) use of medications 06/15/2019   Acute upper respiratory infection 06/06/2019   Exposure to COVID-19 virus 06/06/2019   BMI 50.0-59.9, adult ([place]) 06/03/2019   Exercise-induced asthma 12/15/2018   Screening for breast cancer 05/29/2018   Duodenal ulcer disease 05/29/2018   Screening for malignant neoplasm of cervix 05/29/2018   Dysuria 05/29/2018   Surgical wound dehiscence, initial encounter 04/14/2018   Postoperative abdominal hernia with obstruction    Incarcerated ventral hernia 03/22/2018   SBO (small bowel obstruction) ([place]) 03/07/2018   Abscess of abdominal wall 03/01/2018   Persistent umbilical sinus 67/89/3810   Ringworm of body 01/23/2018   Atopic dermatitis 12/05/2017   Adjustment disorder with mixed anxiety and depressed mood 03/10/2017   Major depressive disorder 03/10/2017   Primary osteoarthritis of both knees 01/19/2017   Suicide attempt ([place]) 07/10/2016   Fibromyalgia 07/10/2016   High risk medication use 07/10/2016   AKI (acute kidney injury) ([place]) 11/09/2015   Elevated troponin 11/09/2015   Hypotension 11/09/2015  Respiratory failure ([place])    Acute hepatic failure 11/08/2015   Drug overdose 11/08/2015   Fatty infiltration of liver 02/16/2015   Hepatic fibrosis 02/16/2015   Abnormal serum level of alkaline  phosphatase 02/15/2015   Iron deficiency anemia 02/05/2015   Vitamin B 12 deficiency 02/05/2015   OP (osteoporosis) 06/16/2014   Rheumatoid arteritis ([place]) 03/02/2014   Hypothyroidism 03/02/2014   Rheumatic fever without heart involvement 03/02/2014   Adult hypothyroidism 03/02/2014   Arthritis of pelvic region, degenerative 03/02/2014   Bipolar 1 disorder, depressed ([place]) 02/26/2014   Bariatric surgery status 11/24/2013   Affective bipolar disorder ([place]) 11/24/2013   Bipolar affective disorder ([place]) 11/24/2013   Rheumatoid arthritis ([place]) 11/24/2013   Polysubstance (excluding opioids) dependence ([place]) 09/11/2013   Polysubstance dependence ([place]) 09/11/2013   Combined drug dependence excluding opioids ([place]) 09/11/2013   Arthritis or polyarthritis, rheumatoid ([place]) 09/05/2013   Leg weakness 09/05/2013    Past Medical History:  Diagnosis Date   Anemia    Anxiety    Bipolar 1 disorder (HCC)    Collagen vascular disease ([place])    rhematoid arthritis   Constipation    Dyspnea    Fibromyalgia    GERD (gastroesophageal reflux disease)    Heart murmur    mild-asymptomatic   Hepatic steatosis    History of kidney stones    History of Roux-en-Y gastric bypass    Hypotension    Hypothyroidism    Morbid obesity with BMI of 50.0-59.9, adult (HCC)    Opioid abuse (HCC)    Osteoarthritis    Osteoporosis    PONV (postoperative nausea and vomiting)    nausea only   Rheumatic fever    Rheumatoid arthritis ([place])    Small bowel obstruction ([place])    Thyroid disease     Family History  Problem Relation Age of Onset   Depression Mother    Dementia Mother    Heart disease Mother    Osteoporosis Mother    Parkinson's disease Father    Liver disease Brother    Colon cancer Neg Hx    Past Surgical History:  Procedure Laterality Date   CHOLECYSTECTOMY  2004   COLONOSCOPY N/A 07/16/2017   Procedure: COLONOSCOPY;  Surgeon: Manya Silvas, MD;  Location: [facility] ENDOSCOPY;  Service:  Endoscopy;  Laterality: N/A;   EXPLORATORY LAPAROTOMY  2009   GASTRIC BYPASS  2003   [facility]   HEEL SPUR EXCISION Bilateral    KNEE ARTHROSCOPY WITH SUBCHONDROPLASTY Left 09/27/2021   Procedure: Left knee subchondroplasty medial tibial plateau;  Surgeon: Hessie Knows, MD;  Location: [facility] ORS;  Service: Orthopedics;  Laterality: Left;   LAPAROTOMY N/A 03/01/2018   Procedure: EXPLORATORY LAPAROTOMY/ UMBILECTOMY;  Surgeon: Robert Bellow, MD;  Location: [facility] ORS;  Service: General;  Laterality: N/A;   TONSILLECTOMY     VENTRAL HERNIA REPAIR N/A 03/07/2018   Procedure: HERNIA REPAIR VENTRAL ADULT;  Surgeon: Robert Bellow, MD;  Location: [facility] ORS;  Service: General;  Laterality: N/A;   Social History   Social History Narrative   Not on file   Immunization History  Administered Date(s) Administered   Influenza, Quadrivalent, Recombinant, Inj, Pf 04/26/2019   Influenza,inj,Quad PF,6+ Mos 04/24/2016   Influenza-Unspecified 04/19/2021   Moderna SARS-COV2 Booster Vaccination 01/30/2021, 05/13/2021   Moderna Sars-Covid-2 Vaccination 10/25/2019, 11/22/2019, 06/02/2020   Pneumococcal Polysaccharide-23 03/02/2018     Objective: Vital Signs: BP 136/85 (BP Location: Left Arm, Patient Position: Sitting, Cuff Size: Large)   Pulse 66  Resp 14   Ht '5\' 5"'$  (1.651 m)   Wt (!) 348 lb (157.9 kg)   BMI 57.91 kg/m    Physical Exam Vitals and nursing note reviewed.  Constitutional:      Appearance: She is well-developed.  HENT:     Head: Normocephalic and atraumatic.  Eyes:     Conjunctiva/sclera: Conjunctivae normal.  Cardiovascular:     Rate and Rhythm: Normal rate and regular rhythm.     Heart sounds: Normal heart sounds.  Pulmonary:     Effort: Pulmonary effort is normal.     Breath sounds: Normal breath sounds.  Abdominal:     General: Bowel sounds are normal.     Palpations: Abdomen is soft.  Musculoskeletal:     Cervical back: Normal range of motion.   Lymphadenopathy:     Cervical: No cervical adenopathy.  Skin:    General: Skin is warm and dry.     Capillary Refill: Capillary refill takes less than 2 seconds.  Neurological:     Mental Status: She is alert and oriented to person, place, and time.  Psychiatric:        Behavior: Behavior normal.      Musculoskeletal Exam: C-spine was in good range of motion.  Shoulder joints, elbow joints, wrist joints, MCPs PIPs and DIPs with good range of motion.  She had bilateral DIP thickening consistent with osteoarthritis.  No synovitis was noted.  Hip joints were difficult to assess in sitting position.  Knee joints with good range of motion without any warmth swelling or effusion.  There was no tenderness over ankles or MTPs.  CDAI Exam: CDAI Score: 0.4  Patient Global: 2 mm; Provider Global: 2 mm Swollen: 0 ; Tender: 0  Joint Exam 03/22/2022   No joint exam has been documented for this visit   There is currently no information documented on the homunculus. Go to the Rheumatology activity and complete the homunculus joint exam.  Investigation: No additional findings.  Imaging: No results found.  Recent Labs: Lab Results  Component Value Date   WBC 4.9 01/10/2022   HGB 12.0 01/10/2022   PLT 208 01/10/2022   NA 137 01/10/2022   K 3.8 01/10/2022   CL 104 01/10/2022   CO2 22 01/10/2022   GLUCOSE 91 01/10/2022   BUN 22 01/10/2022   CREATININE 1.00 01/10/2022   BILITOT 0.4 01/10/2022   ALKPHOS 105 01/10/2022   AST 23 01/10/2022   ALT 16 01/10/2022   PROT 7.2 01/10/2022   ALBUMIN 3.9 01/10/2022   CALCIUM 9.1 01/10/2022   GFRAA 99 05/26/2020    Speciality Comments: PLQ Eye Exam: 03/24/2021 WNL @ [facility] follow up in 6 months. Patient will call to schedule appt 11/02/2021 shc  Procedures:  No procedures performed Allergies: Lactose intolerance (gi) and Sulfa antibiotics   Assessment / Plan:     Visit Diagnoses: Rheumatoid arthritis involving multiple sites with  positive rheumatoid factor (HCC)-she denies any joint swelling.  She continues to have joint with discomfort.  She is on hydroxychloroquine 200 mg p.o. twice daily which she is tolerating well.  She complains of discomfort in her bilateral hands.  No synovitis was noted.  Discomfort is mostly in the DIP joints.  The findings were consistent with osteoarthritis.  Detailed counseling on osteoarthritis was provided.  High risk medication use - Plaquenil 200 mg 1 tablet by mouth twice daily.  PLQ Eye Exam: 03/24/2021.  Labs obtained on January 10, 2022 were reviewed which were  within normal limits.  She was advised to get repeat labs in November.  Eye examination is due.  She will schedule eye examination.  Information regarding immunization was placed in the AVS.  Primary osteoarthritis of both hands-she complains of discomfort and stiffness in her hands.  She had bilateral DIP thickening.  Joint protection muscle strengthening was discussed.  Trochanteric bursitis of left hip-she has off-and-on discomfort.  She did not have tenderness on my exam.  Primary osteoarthritis of both knees - S/p orthovisc bilateral knees 08/2020-09/2020. She underwent a left knee subchondroplasty of the medial tibial plateau on 09/27/21 performed by Dr. Rudene Christians.  Patient states that she has been getting viscosupplement injections at [facility].  She had some relief in the left knee joint after the Visco supplement injection.  DDD (degenerative disc disease), cervical-she had good range of motion without discomfort.  DDD (degenerative disc disease), thoracic-she continues to have some thoracic pain and thoracic kyphosis.  Stretching exercises were demonstrated in the office.  DDD (degenerative disc disease), lumbar-she denies lower back pain today.  Other osteoporosis without current pathological fracture - Prolia injections through [facility].  She is also taking calcium and vitamin D.  Fibromyalgia-she had a recent flare  of fibromyalgia with increased pain and discomfort.  She states she has flares with weather change.  Need for regular exercise and stretching was emphasized.  She is currently on gabapentin, and tizanidine.  Trapezius muscle spasm-stretching exercises were demonstrated.  Other medical problems listed as follows:  History of gastroesophageal reflux (GERD)  History of bipolar disorder  History of anemia  History of hypothyroidism  BMI 57.91  Orders: No orders of the defined types were placed in this encounter.  No orders of the defined types were placed in this encounter.    Follow-Up Instructions: Return in about 5 months (around 08/22/2022) for Rheumatoid arthritis.   Bo Merino, MD  Note - This record has been created using Editor, commissioning.  Chart creation errors have been sought, but may not always  have been located. Such creation errors do not reflect on  the standard of medical care.

## 2022-03-12 ENCOUNTER — Other Ambulatory Visit: Payer: Self-pay | Admitting: Nurse Practitioner

## 2022-03-12 DIAGNOSIS — E039 Hypothyroidism, unspecified: Secondary | ICD-10-CM

## 2022-03-13 ENCOUNTER — Inpatient Hospital Stay: Payer: Medicare HMO | Attending: Oncology

## 2022-03-13 DIAGNOSIS — D519 Vitamin B12 deficiency anemia, unspecified: Secondary | ICD-10-CM | POA: Insufficient documentation

## 2022-03-13 DIAGNOSIS — E538 Deficiency of other specified B group vitamins: Secondary | ICD-10-CM

## 2022-03-13 MED ORDER — CYANOCOBALAMIN 1000 MCG/ML IJ SOLN
1000.0000 ug | Freq: Once | INTRAMUSCULAR | Status: AC
  Administered 2022-03-13: 1000 ug via INTRAMUSCULAR
  Filled 2022-03-13: qty 1

## 2022-03-15 ENCOUNTER — Telehealth: Payer: Self-pay

## 2022-03-15 NOTE — Telephone Encounter (Signed)
Pt advised that we received letter from [place] that tizanidine [place] [phone] and lot no [id number] and [id number] if that lot no she had she can take back to phar

## 2022-03-21 ENCOUNTER — Other Ambulatory Visit: Payer: Self-pay

## 2022-03-22 ENCOUNTER — Encounter: Payer: Self-pay | Admitting: Rheumatology

## 2022-03-22 ENCOUNTER — Ambulatory Visit: Payer: Medicare HMO | Attending: Rheumatology | Admitting: Rheumatology

## 2022-03-22 VITALS — BP 136/85 | HR 66 | Resp 14 | Ht 65.0 in | Wt 348.0 lb

## 2022-03-22 DIAGNOSIS — M818 Other osteoporosis without current pathological fracture: Secondary | ICD-10-CM

## 2022-03-22 DIAGNOSIS — M5134 Other intervertebral disc degeneration, thoracic region: Secondary | ICD-10-CM | POA: Diagnosis not present

## 2022-03-22 DIAGNOSIS — Z8719 Personal history of other diseases of the digestive system: Secondary | ICD-10-CM

## 2022-03-22 DIAGNOSIS — M62838 Other muscle spasm: Secondary | ICD-10-CM | POA: Diagnosis not present

## 2022-03-22 DIAGNOSIS — M0579 Rheumatoid arthritis with rheumatoid factor of multiple sites without organ or systems involvement: Secondary | ICD-10-CM

## 2022-03-22 DIAGNOSIS — Z8639 Personal history of other endocrine, nutritional and metabolic disease: Secondary | ICD-10-CM

## 2022-03-22 DIAGNOSIS — M19042 Primary osteoarthritis, left hand: Secondary | ICD-10-CM

## 2022-03-22 DIAGNOSIS — M5030 Other cervical disc degeneration, unspecified cervical region: Secondary | ICD-10-CM

## 2022-03-22 DIAGNOSIS — M19041 Primary osteoarthritis, right hand: Secondary | ICD-10-CM

## 2022-03-22 DIAGNOSIS — M797 Fibromyalgia: Secondary | ICD-10-CM | POA: Diagnosis not present

## 2022-03-22 DIAGNOSIS — M5136 Other intervertebral disc degeneration, lumbar region: Secondary | ICD-10-CM | POA: Diagnosis not present

## 2022-03-22 DIAGNOSIS — M170 Bilateral primary osteoarthritis of knee: Secondary | ICD-10-CM

## 2022-03-22 DIAGNOSIS — Z79899 Other long term (current) drug therapy: Secondary | ICD-10-CM

## 2022-03-22 DIAGNOSIS — M7062 Trochanteric bursitis, left hip: Secondary | ICD-10-CM | POA: Diagnosis not present

## 2022-03-22 DIAGNOSIS — Z8659 Personal history of other mental and behavioral disorders: Secondary | ICD-10-CM

## 2022-03-22 DIAGNOSIS — Z862 Personal history of diseases of the blood and blood-forming organs and certain disorders involving the immune mechanism: Secondary | ICD-10-CM

## 2022-03-22 DIAGNOSIS — M79674 Pain in right toe(s): Secondary | ICD-10-CM

## 2022-03-22 DIAGNOSIS — Z6841 Body Mass Index (BMI) 40.0 and over, adult: Secondary | ICD-10-CM

## 2022-03-22 NOTE — Patient Instructions (Signed)
Standing Labs We placed an order today for your standing lab work.   Please have your standing labs drawn in November and every 3 months and you  If possible, please have your labs drawn 2 weeks prior to your appointment so that the provider can discuss your results at your appointment.  Please note that you may see your imaging and lab results in [place] before we have reviewed them. We may be awaiting multiple results to interpret others before contacting you. Please allow our office up to 72 hours to thoroughly review all of the results before contacting the office for clarification of your results.  We currently have open lab daily: Monday through Thursday from 1:30 PM-4:30 PM and Friday from 1:30 PM- 4:00 PM If possible, please come for your lab work on Monday, Thursday or Friday afternoons, as you may experience shorter wait times.   Effective May 31, 2022 the new lab hours will change to: Monday through Thursday from 1:30 PM-5:00 PM and Friday from 8:30 AM-12:00 PM If possible, please come for your lab work on Monday and Thursday afternoons, as you may experience shorter wait times.  Please be advised, all patients with office appointments requiring lab work will take precedent over walk-in lab work.    The office is located at [address], [place], [place], [place] [address] No appointment is necessary.   Labs are drawn by [facility]. Please bring your co-pay at the time of your lab draw.  You may receive a bill from [facility] for your lab work.  Please note if you are on Hydroxychloroquine and and an order has been placed for a Hydroxychloroquine level, you will need to have it drawn 4 hours or more after your last dose.  If you wish to have your labs drawn at another location, please call the office 24 hours in advance to send orders.  If you have any questions regarding directions or hours of operation,  please call [phone].   As a reminder, please drink plenty of water  prior to coming for your lab work. Thanks!   Vaccines You are taking a medication(s) that can suppress your immune system.  The following immunizations are recommended: Flu annually Covid-19  Td/Tdap (tetanus, diphtheria, pertussis) every 10 years Pneumonia (Prevnar 15 then Pneumovax 23 at least 1 year apart.  Alternatively, can take Prevnar 20 without needing additional dose) Shingrix: 2 doses from 4 weeks to 6 months apart  Please check with your PCP to make sure you are up to date.

## 2022-04-05 ENCOUNTER — Other Ambulatory Visit (HOSPITAL_COMMUNITY): Payer: Self-pay | Admitting: *Deleted

## 2022-04-05 DIAGNOSIS — F603 Borderline personality disorder: Secondary | ICD-10-CM

## 2022-04-05 DIAGNOSIS — F332 Major depressive disorder, recurrent severe without psychotic features: Secondary | ICD-10-CM

## 2022-04-05 MED ORDER — LAMOTRIGINE 150 MG PO TABS: 150.0000 mg | ORAL_TABLET | Freq: Two times a day (BID) | ORAL | 1 refills | Status: DC

## 2022-04-08 ENCOUNTER — Other Ambulatory Visit (HOSPITAL_COMMUNITY): Payer: Self-pay | Admitting: Psychiatry

## 2022-04-08 DIAGNOSIS — F411 Generalized anxiety disorder: Secondary | ICD-10-CM

## 2022-04-13 ENCOUNTER — Inpatient Hospital Stay: Payer: Medicare HMO | Attending: Oncology

## 2022-04-13 DIAGNOSIS — D519 Vitamin B12 deficiency anemia, unspecified: Secondary | ICD-10-CM | POA: Insufficient documentation

## 2022-04-13 DIAGNOSIS — E538 Deficiency of other specified B group vitamins: Secondary | ICD-10-CM

## 2022-04-13 MED ORDER — CYANOCOBALAMIN 1000 MCG/ML IJ SOLN
1000.0000 ug | Freq: Once | INTRAMUSCULAR | Status: AC
  Administered 2022-04-13: 1000 ug via INTRAMUSCULAR
  Filled 2022-04-13: qty 1

## 2022-04-16 ENCOUNTER — Encounter: Payer: Self-pay | Admitting: Nurse Practitioner

## 2022-04-17 DIAGNOSIS — G894 Chronic pain syndrome: Secondary | ICD-10-CM | POA: Insufficient documentation

## 2022-04-17 DIAGNOSIS — M899 Disorder of bone, unspecified: Secondary | ICD-10-CM | POA: Insufficient documentation

## 2022-04-17 DIAGNOSIS — Z789 Other specified health status: Secondary | ICD-10-CM | POA: Insufficient documentation

## 2022-04-17 DIAGNOSIS — Z79899 Other long term (current) drug therapy: Secondary | ICD-10-CM | POA: Insufficient documentation

## 2022-04-17 NOTE — Progress Notes (Unsigned)
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 stones, History of Roux-en-Y gastric bypass, Hypotension, Hypothyroidism, Morbid obesity with BMI of 50.0-59.9, adult ([place]), Opioid abuse ([place]), Osteoarthritis, Osteoporosis, PONV (postoperative nausea and vomiting), Rheumatic fever, Rheumatoid arthritis ([place]), Small bowel obstruction ([place]), and Thyroid disease. Family: family history includes Dementia in her mother; Depression in her mother; Heart disease in her mother; Liver disease in her brother; Osteoporosis in her mother; Parkinson's disease in her father.  Past Surgical History:  Procedure Laterality Date   CHOLECYSTECTOMY  2004   COLONOSCOPY N/A 07/16/2017   Procedure: COLONOSCOPY;  Surgeon: Manya Silvas, MD;  Location: [facility] ENDOSCOPY;  Service: Endoscopy;  Laterality: N/A;   EXPLORATORY LAPAROTOMY  2009   GASTRIC BYPASS   2003   [facility]   HEEL SPUR EXCISION Bilateral    KNEE ARTHROSCOPY WITH SUBCHONDROPLASTY Left 09/27/2021   Procedure: Left knee subchondroplasty medial tibial plateau;  Surgeon: Hessie Knows, MD;  Location: [facility] ORS;  Service: Orthopedics;  Laterality: Left;   LAPAROTOMY N/A 03/01/2018   Procedure: EXPLORATORY LAPAROTOMY/ UMBILECTOMY;  Surgeon: Robert Bellow, MD;  Location: [facility] ORS;  Service: General;  Laterality: N/A;   TONSILLECTOMY     VENTRAL HERNIA REPAIR N/A 03/07/2018   Procedure: HERNIA REPAIR VENTRAL ADULT;  Surgeon: Robert Bellow, MD;  Location: [facility] ORS;  Service: General;  Laterality: N/A;   Active Ambulatory Problems    Diagnosis Date Noted   Rheumatoid arteritis ([place]) 03/02/2014   Hypothyroidism 03/02/2014   Polysubstance (excluding opioids) dependence ([place]) 09/11/2013   Bariatric surgery status 11/24/2013   Arthritis or polyarthritis, rheumatoid ([place]) 09/05/2013   Iron deficiency anemia 02/05/2015   Vitamin B 12 deficiency 02/05/2015   Bipolar affective disorder ([place]) 11/24/2013   Rheumatic fever without heart involvement 03/02/2014   Polysubstance dependence ([place]) 09/11/2013   OP (osteoporosis) 06/16/2014   Adult hypothyroidism 03/02/2014   Fatty infiltration of liver 02/16/2015   Hepatic fibrosis 02/16/2015   Arthritis of pelvic region, degenerative 03/02/2014   Combined drug dependence excluding opioids ([place]) 09/11/2013   Rheumatoid arthritis ([place]) 11/24/2013   Drug overdose 11/08/2015   Hypotension 11/09/2015   Respiratory failure (HCC)    Suicide attempt ([place]) 07/10/2016   Fibromyalgia 07/10/2016   High risk medication use 07/10/2016   Primary osteoarthritis of both knees 01/19/2017   Atopic dermatitis 12/05/2017   Persistent umbilical sinus 07/23/8249   SBO (small bowel obstruction) ([place]) 03/07/2018   Incarcerated ventral hernia 03/22/2018   Postoperative abdominal hernia with obstruction    Surgical wound dehiscence, initial encounter  04/14/2018   Adjustment disorder with mixed anxiety and depressed mood 03/10/2017   Major depressive disorder 03/10/2017   Screening for breast cancer 05/29/2018   Duodenal ulcer disease 05/29/2018   Screening for malignant neoplasm of cervix 05/29/2018   Dysuria 05/29/2018   Exercise-induced asthma 12/15/2018   Acute upper respiratory infection 06/06/2019   Exposure to COVID-19 virus 06/06/2019   Encounter for general adult medical examination with abnormal findings 06/15/2019   Closed fracture of left elbow 06/15/2019   Urinary tract infection without hematuria 06/15/2019   Encounter for long-term (current) use of medications 06/15/2019   Bipolar affective disorder, current episode mixed ([place]) 11/09/2019   Neck pain, musculoskeletal 03/24/2020   BMI 50.0-59.9, adult ([place]) 06/03/2019   Chronic pain syndrome 04/17/2022   Pharmacologic therapy 04/17/2022   Disorder of skeletal system 04/17/2022   Problems influencing health status 04/17/2022   Bipolar 1 disorder, depressed ([place]) 09/05/2013   History of attempted suicide (07/10/2016) 04/18/2022   History of drug overdose (11/08/2015) 04/18/2022   Resolved Ambulatory Problems    Diagnosis Date Noted   Bipolar 1 disorder, depressed ([place]) 02/26/2014   Osteoarthritis of left hip 03/02/2014   Affective bipolar disorder ([place]) 11/24/2013   Abnormal serum level of alkaline phosphatase 02/15/2015   Leg weakness 09/05/2013   Acute hepatic failure 11/08/2015   AKI (acute kidney injury) ([place]) 11/09/2015   Elevated troponin 03/70/4888   Umbilical bleeding 91/69/4503   Ringworm of body 01/23/2018   Abscess of abdominal wall 03/01/2018   Generalized abdominal pain 03/10/2018   Past Medical History:  Diagnosis Date   Anemia    Anxiety    Bipolar 1 disorder (HCC)    Collagen vascular disease (HCC)    Constipation    Dyspnea    GERD (gastroesophageal reflux disease)    Heart murmur    Hepatic steatosis    History of kidney stones     History of Roux-en-Y gastric bypass    Morbid obesity with BMI of 50.0-59.9, adult ([place])    Opioid abuse ([place])    Osteoarthritis    Osteoporosis    PONV (postoperative nausea and vomiting)    Rheumatic fever    Small bowel obstruction ([place])    Thyroid disease    Constitutional Exam  General appearance: Well nourished, well developed,  and well hydrated. In no apparent acute distress There were no vitals filed for this visit. BMI Assessment: Estimated body mass index is 57.91 kg/m as calculated from the following:   Height as of 03/22/22: '5\' 5"'  (1.651 m).   Weight as of 03/22/22: 348 lb (157.9 kg).  BMI interpretation table: BMI level Category Range association with higher incidence of chronic pain  <18 kg/m2 Underweight   18.5-24.9 kg/m2 Ideal body weight   25-29.9 kg/m2 Overweight Increased incidence by 20%  30-34.9 kg/m2 Obese (Class I) Increased incidence by 68%  35-39.9 kg/m2 Severe obesity (Class II) Increased incidence by 136%  >40 kg/m2 Extreme obesity (Class III) Increased incidence by 254%   Patient's current BMI Ideal Body weight  There is no height or weight on file to calculate BMI. Patient weight not recorded   BMI Readings from Last 4 Encounters:  03/22/22 57.91 kg/m  03/02/22 59.37 kg/m  01/27/22 57.38 kg/m  01/10/22 58.04 kg/m   Wt Readings from Last 4 Encounters:  03/22/22 (!) 348 lb (157.9 kg)  03/02/22 (!) 356 lb 12.8 oz (161.8 kg)  01/27/22 (!) 348 lb (157.9 kg)  01/10/22 (!) 352 lb (159.7 kg)    Psych/Mental status: Alert, oriented x 3 (person, place, & time)       Eyes: PERLA Respiratory: No evidence of acute respiratory distress  Assessment  Primary Diagnosis & Pertinent Problem List: The primary encounter diagnosis was Chronic pain syndrome. Diagnoses of Pharmacologic therapy, Disorder of skeletal system, Problems influencing health status, BMI 50.0-59.9, adult ([place]), History of attempted suicide (07/10/2016), and History of drug overdose  (11/08/2015) were also pertinent to this visit.  Visit Diagnosis (New problems to examiner): 1. Chronic pain syndrome   2. Pharmacologic therapy   3. Disorder of skeletal system   4. Problems influencing health status   5. BMI 50.0-59.9, adult ([place])   6. History of attempted suicide (07/10/2016)   7. History of drug overdose (11/08/2015)    Plan of Care (Initial workup plan)  Note: Ms. Sedlak was reminded that as per protocol, today's visit has been an evaluation only. We have not taken over the patient's controlled substance management.  Problem-specific plan: No problem-specific Assessment & Plan notes found for this encounter.  Lab Orders  No laboratory test(s) ordered today   Imaging Orders  No imaging studies ordered today   Referral Orders  No referral(s) requested today   Procedure Orders    No procedure(s) ordered today   Pharmacotherapy (current): Medications ordered:  No orders of the defined types were placed in this encounter.  Medications administered during this visit: Kiona A. Beattie had no medications administered during this visit.   Pharmacological management options:  Opioid Analgesics: The patient was informed that there is no guarantee that she would be a candidate for opioid analgesics. The decision will be made following CDC guidelines. This decision will be based on the results of diagnostic studies, as well as Ms. Buchler's risk profile.   Membrane stabilizer: To be determined at a later time  Muscle relaxant: To be determined at a later time  NSAID: To be determined at a later time  Other analgesic(s): To be determined at a later time   Interventional management options: Ms. Kindig was informed that there is no guarantee that she would be a candidate for interventional therapies. The decision will be based on the results of diagnostic studies, as well as Ms. Pieczynski's risk profile.  Procedure(s) under consideration:  Pending  results of ordered  studies      Interventional Therapies  Risk  Complexity Considerations:   Estimated body mass index is 57.91 kg/m as calculated from the following:   Height as of 03/22/22: '5\' 5"'  (1.651 m).   Weight as of 03/22/22: 348 lb (157.9 kg). Plaquenil ANTICOAGULATION (Stop: 11 days) WNL   Planned  Pending:   Pending further evaluation   Under consideration:   ***   Completed:   None at this time   Completed by other providers:   None at this time   Therapeutic  Palliative (PRN) options:   None established      Provider-requested follow-up: No follow-ups on file.  Future Appointments  Date Time Provider [place]  04/19/2022  2:00 PM Milinda Pointer, MD [facility] None  05/10/2022  2:30 PM Norma Fredrickson, MD [facility] None  05/12/2022  1:00 PM [facility] INJE[facility] None  05/23/2022 10:00 AM Vaillancourt, Samantha, PA-C [facility] None  06/09/2022 10:00 AM McDonough, Si Gaul, PA-C [facility] None  06/13/2022  1:00 PM [facility] INJE[facility] None  07/12/2022  1:00 PM [facility] LAB [facility] None  07/12/2022  1:15 PM Sindy Guadeloupe, MD [facility] None  08/22/2022  3:00 PM Ofilia Neas, PA-C [facility] None    Note by: Gaspar Cola, MD Date: 04/19/2022; Time: 8:06 AM

## 2022-04-18 DIAGNOSIS — Z9189 Other specified personal risk factors, not elsewhere classified: Secondary | ICD-10-CM | POA: Insufficient documentation

## 2022-04-18 DIAGNOSIS — Z9151 Personal history of suicidal behavior: Secondary | ICD-10-CM | POA: Insufficient documentation

## 2022-04-19 ENCOUNTER — Ambulatory Visit: Payer: Medicare HMO | Attending: Pain Medicine | Admitting: Pain Medicine

## 2022-04-19 ENCOUNTER — Encounter: Payer: Self-pay | Admitting: Pain Medicine

## 2022-04-19 ENCOUNTER — Telehealth: Payer: Self-pay

## 2022-04-19 VITALS — BP 147/92 | HR 67 | Temp 97.2°F | Ht 65.0 in | Wt 348.0 lb

## 2022-04-19 DIAGNOSIS — Z79899 Other long term (current) drug therapy: Secondary | ICD-10-CM | POA: Insufficient documentation

## 2022-04-19 DIAGNOSIS — F1920 Other psychoactive substance dependence, uncomplicated: Secondary | ICD-10-CM | POA: Insufficient documentation

## 2022-04-19 DIAGNOSIS — M1712 Unilateral primary osteoarthritis, left knee: Secondary | ICD-10-CM | POA: Diagnosis not present

## 2022-04-19 DIAGNOSIS — Z9151 Personal history of suicidal behavior: Secondary | ICD-10-CM | POA: Diagnosis present

## 2022-04-19 DIAGNOSIS — Z7901 Long term (current) use of anticoagulants: Secondary | ICD-10-CM | POA: Insufficient documentation

## 2022-04-19 DIAGNOSIS — M899 Disorder of bone, unspecified: Secondary | ICD-10-CM | POA: Insufficient documentation

## 2022-04-19 DIAGNOSIS — G8929 Other chronic pain: Secondary | ICD-10-CM | POA: Diagnosis not present

## 2022-04-19 DIAGNOSIS — Z789 Other specified health status: Secondary | ICD-10-CM | POA: Insufficient documentation

## 2022-04-19 DIAGNOSIS — Z9189 Other specified personal risk factors, not elsewhere classified: Secondary | ICD-10-CM | POA: Diagnosis not present

## 2022-04-19 DIAGNOSIS — M25562 Pain in left knee: Secondary | ICD-10-CM | POA: Insufficient documentation

## 2022-04-19 DIAGNOSIS — Z6841 Body Mass Index (BMI) 40.0 and over, adult: Secondary | ICD-10-CM | POA: Diagnosis not present

## 2022-04-19 DIAGNOSIS — G894 Chronic pain syndrome: Secondary | ICD-10-CM | POA: Diagnosis not present

## 2022-04-19 DIAGNOSIS — R69 Illness, unspecified: Secondary | ICD-10-CM | POA: Diagnosis not present

## 2022-04-19 NOTE — Patient Instructions (Signed)
______________________________________________________________________  Preparing for Procedure with Sedation  NOTICE: Due to recent regulatory changes, starting on February 28, 2021, procedures requiring intravenous (IV) sedation will no longer be performed at the [facility].  These types of procedures are required to be performed at [facility] ambulatory surgery facility.  We are very sorry for the inconvenience.  Procedure appointments are limited to planned procedures: No Prescription Refills. No disability issues will be discussed. No medication changes will be discussed.  Instructions: Oral Intake: Do not eat or drink anything for at least 8 hours prior to your procedure. (Exception: Blood Pressure Medication. See below.) Transportation: A driver is required. You may not drive yourself after the procedure. Blood Pressure Medicine: Do not forget to take your blood pressure medicine with a sip of water the morning of the procedure. If your Diastolic (lower reading) is above 100 mmHg, elective cases will be cancelled/rescheduled. Blood thinners: These will need to be stopped for procedures. Notify our staff if you are taking any blood thinners. Depending on which one you take, there will be specific instructions on how and when to stop it. Diabetics on insulin: Notify the staff so that you can be scheduled 1st case in the morning. If your diabetes requires high dose insulin, take only  of your normal insulin dose the morning of the procedure and notify the staff that you have done so. Preventing infections: Shower with an antibacterial soap the morning of your procedure. Build-up your immune system: Take 1000 mg of Vitamin C with every meal (3 times a day) the day prior to your procedure. Antibiotics: Inform the staff if you have a condition or reason that requires you to take antibiotics before dental procedures. Pregnancy: If you are pregnant, call and cancel the procedure. Sickness: If  you have a cold, fever, or any active infections, call and cancel the procedure. Arrival: You must be in the facility at least 30 minutes prior to your scheduled procedure. Children: Do not bring children with you. Dress appropriately: There is always the possibility that your clothing may get soiled. Valuables: Do not bring any jewelry or valuables.  Reasons to call and reschedule or cancel your procedure: (Following these recommendations will minimize the risk of a serious complication.) Surgeries: Avoid having procedures within 2 weeks of any surgery. (Avoid for 2 weeks before or after any surgery). Flu Shots: Avoid having procedures within 2 weeks of a flu shots. (Avoid for 2 weeks before or after immunizations). Barium: Avoid having a procedure within 7-10 days after having had a radiological study involving the use of radiological contrast. (Myelograms, Barium swallow or enema study). Heart attacks: Avoid any elective procedures or surgeries for the initial 6 months after a "Myocardial Infarction" (Heart Attack). Blood thinners: It is imperative that you stop these medications before procedures. Let us know if you if you take any blood thinner.  Infection: Avoid procedures during or within two weeks of an infection (including chest colds or gastrointestinal problems). Symptoms associated with infections include: Localized redness, fever, chills, night sweats or profuse sweating, burning sensation when voiding, cough, congestion, stuffiness, runny nose, sore throat, diarrhea, nausea, vomiting, cold or Flu symptoms, recent or current infections. It is specially important if the infection is over the area that we intend to treat. Heart and lung problems: Symptoms that may suggest an active cardiopulmonary problem include: cough, chest pain, breathing difficulties or shortness of breath, dizziness, ankle swelling, uncontrolled high or unusually low blood pressure, and/or palpitations. If you are    experiencing any of these symptoms, cancel your procedure and contact your primary care physician for an evaluation.  Remember:  Regular Business hours are:  Monday to Thursday 8:00 AM to 4:00 PM  Provider's Schedule: Quartez Lagos, MD:  Procedure days: Tuesday and Thursday 7:30 AM to 4:00 PM  Bilal Lateef, MD:  Procedure days: Monday and Wednesday 7:30 AM to 4:00 PM ______________________________________________________________________  ____________________________________________________________________________________________  General Risks and Possible Complications  Patient Responsibilities: It is important that you read this as it is part of your informed consent. It is our duty to inform you of the risks and possible complications associated with treatments offered to you. It is your responsibility as a patient to read this and to ask questions about anything that is not clear or that you believe was not covered in this document.  Patient's Rights: You have the right to refuse treatment. You also have the right to change your mind, even after initially having agreed to have the treatment done. However, under this last option, if you wait until the last second to change your mind, you may be charged for the materials used up to that point.  Introduction: Medicine is not an exact science. Everything in Medicine, including the lack of treatment(s), carries the potential for danger, harm, or loss (which is by definition: Risk). In Medicine, a complication is a secondary problem, condition, or disease that can aggravate an already existing one. All treatments carry the risk of possible complications. The fact that a side effects or complications occurs, does not imply that the treatment was conducted incorrectly. It must be clearly understood that these can happen even when everything is done following the highest safety standards.  No treatment: You can choose not to proceed with the  proposed treatment alternative. The "PRO(s)" would include: avoiding the risk of complications associated with the therapy. The "CON(s)" would include: not getting any of the treatment benefits. These benefits fall under one of three categories: diagnostic; therapeutic; and/or palliative. Diagnostic benefits include: getting information which can ultimately lead to improvement of the disease or symptom(s). Therapeutic benefits are those associated with the successful treatment of the disease. Finally, palliative benefits are those related to the decrease of the primary symptoms, without necessarily curing the condition (example: decreasing the pain from a flare-up of a chronic condition, such as incurable terminal cancer).  General Risks and Complications: These are associated to most interventional treatments. They can occur alone, or in combination. They fall under one of the following six (6) categories: no benefit or worsening of symptoms; bleeding; infection; nerve damage; allergic reactions; and/or death. No benefits or worsening of symptoms: In Medicine there are no guarantees, only probabilities. No healthcare provider can ever guarantee that a medical treatment will work, they can only state the probability that it may. Furthermore, there is always the possibility that the condition may worsen, either directly, or indirectly, as a consequence of the treatment. Bleeding: This is more common if the patient is taking a blood thinner, either prescription or over the counter (example: Goody Powders, Fish oil, Aspirin, Garlic, etc.), or if suffering a condition associated with impaired coagulation (example: Hemophilia, cirrhosis of the liver, low platelet counts, etc.). However, even if you do not have one on these, it can still happen. If you have any of these conditions, or take one of these drugs, make sure to notify your treating physician. Infection: This is more common in patients with a compromised  immune system, either due to disease (example:   diabetes, cancer, human immunodeficiency virus [HIV], etc.), or due to medications or treatments (example: therapies used to treat cancer and rheumatological diseases). However, even if you do not have one on these, it can still happen. If you have any of these conditions, or take one of these drugs, make sure to notify your treating physician. Nerve Damage: This is more common when the treatment is an invasive one, but it can also happen with the use of medications, such as those used in the treatment of cancer. The damage can occur to small secondary nerves, or to large primary ones, such as those in the spinal cord and brain. This damage may be temporary or permanent and it may lead to impairments that can range from temporary numbness to permanent paralysis and/or brain death. Allergic Reactions: Any time a substance or material comes in contact with our body, there is the possibility of an allergic reaction. These can range from a mild skin rash (contact dermatitis) to a severe systemic reaction (anaphylactic reaction), which can result in death. Death: In general, any medical intervention can result in death, most of the time due to an unforeseen complication. ____________________________________________________________________________________________ ____________________________________________________________________________________________  Blood Thinners  IMPORTANT NOTICE:  If you take any of these, make sure to notify the nursing staff.  Failure to do so may result in injury.  Recommended time intervals to stop and restart blood-thinners, before & after invasive procedures  Generic Name Brand Name Pre-procedure. Stop this long before procedure. Post-procedure. Minimum waiting period before restarting.  Abciximab Reopro 15 days 2 hrs  Alteplase Activase 10 days 10 days  Anagrelide Agrylin    Apixaban Eliquis 3 days 6 hrs  Cilostazol Pletal  3 days 5 hrs  Clopidogrel Plavix 7-10 days 2 hrs  Dabigatran Pradaxa 5 days 6 hrs  Dalteparin Fragmin 24 hours 4 hrs  Dipyridamole Aggrenox 11days 2 hrs  Edoxaban Lixiana; Savaysa 3 days 2 hrs  Enoxaparin  Lovenox 24 hours 4 hrs  Eptifibatide Integrillin 8 hours 2 hrs  Fondaparinux  Arixtra 72 hours 12 hrs  Hydroxychloroquine Plaquenil 11 days   Prasugrel Effient 7-10 days 6 hrs  Reteplase Retavase 10 days 10 days  Rivaroxaban Xarelto 3 days 6 hrs  Ticagrelor Brilinta 5-7 days 6 hrs  Ticlopidine Ticlid 10-14 days 2 hrs  Tinzaparin Innohep 24 hours 4 hrs  Tirofiban Aggrastat 8 hours 2 hrs  Warfarin Coumadin 5 days 2 hrs   Other medications with blood-thinning effects  Product indications Generic (Brand) names Note  Cholesterol Lipitor Stop 4 days before procedure  Blood thinner (injectable) Heparin (LMW or LMWH Heparin) Stop 24 hours before procedure  Cancer Ibrutinib (Imbruvica) Stop 7 days before procedure  Malaria/Rheumatoid Hydroxychloroquine (Plaquenil) Stop 11 days before procedure  Thrombolytics  10 days before or after procedures   Over-the-counter (OTC) Products with blood-thinning effects  Product Common names Stop Time  Aspirin > 325 mg Goody Powders, Excedrin, etc. 11 days  Aspirin ? 81 mg  7 days  Fish oil  4 days  Garlic supplements  7 days  Ginkgo biloba  36 hours  Ginseng  24 hours  NSAIDs Ibuprofen, Naprosyn, etc. 3 days  Vitamin E  4 days   ____________________________________________________________________________________________  

## 2022-04-19 NOTE — Telephone Encounter (Signed)
Patient be okay to stop hydroxychloroquine prior to knee joint injection.  However stopping hydroxychloroquine is not required.

## 2022-04-19 NOTE — Progress Notes (Signed)
Safety precautions to be maintained throughout the outpatient stay will include: orient to surroundings, keep bed in low position, maintain call bell within reach at all times, provide assistance with transfer out of bed and ambulation.  

## 2022-04-19 NOTE — Telephone Encounter (Signed)
Dr Dossie Arbour would like approval to stop Plaquinil for 11 days prior to Intraarticular Knee injection.  Thank you

## 2022-04-19 NOTE — Telephone Encounter (Signed)
Per Dr. Estanislado Pandy, patient be okay to stop hydroxychloroquine prior to knee joint injection.  However stopping hydroxychloroquine is not required.

## 2022-04-20 ENCOUNTER — Telehealth: Payer: Self-pay

## 2022-04-20 ENCOUNTER — Encounter: Payer: Self-pay | Admitting: Hematology and Oncology

## 2022-04-20 NOTE — Telephone Encounter (Signed)
I scheduled her GNB for 9/28 and she was asking about her blood thinners. Please let her know instructions, if she needs to stop them.

## 2022-04-20 NOTE — Telephone Encounter (Signed)
Approval received to stop Plaquinil for 11 days.  Patient notified.

## 2022-04-24 DIAGNOSIS — Z79899 Other long term (current) drug therapy: Secondary | ICD-10-CM | POA: Diagnosis not present

## 2022-04-25 DIAGNOSIS — Z79899 Other long term (current) drug therapy: Secondary | ICD-10-CM | POA: Diagnosis not present

## 2022-04-27 ENCOUNTER — Ambulatory Visit: Payer: Medicare HMO | Admitting: Pain Medicine

## 2022-04-28 ENCOUNTER — Other Ambulatory Visit: Payer: Self-pay | Admitting: Physician Assistant

## 2022-04-28 DIAGNOSIS — M0579 Rheumatoid arthritis with rheumatoid factor of multiple sites without organ or systems involvement: Secondary | ICD-10-CM

## 2022-04-28 NOTE — Telephone Encounter (Signed)
Next Visit: 08/22/2022  Last Visit: 03/22/2022  Labs: 01/10/2022 CBC/CMP WNL  Eye exam:  03/24/2021 WNL   Current Dose per office note 03/22/2022: Plaquenil 200 mg 1 tablet by mouth twice daily.  DX: Rheumatoid arthritis involving multiple sites with positive rheumatoid factor   Last Fill: 01/30/2022  Left message to advise patient she is due to upadte her PLQ eye exam.   Okay to refill Plaquenil?

## 2022-04-28 NOTE — Telephone Encounter (Signed)
Patient returned call to the office and states she updated her PLQ eye exam on Tuesday. She states she will call the eye doctor to have them send results.

## 2022-05-02 ENCOUNTER — Ambulatory Visit: Payer: Medicare HMO | Attending: Pain Medicine | Admitting: Pain Medicine

## 2022-05-02 ENCOUNTER — Encounter: Payer: Self-pay | Admitting: Pain Medicine

## 2022-05-02 ENCOUNTER — Ambulatory Visit
Admission: RE | Admit: 2022-05-02 | Discharge: 2022-05-02 | Disposition: A | Discharge status: Home / Self Care | Payer: Medicare HMO | Source: Ambulatory Visit | Attending: Pain Medicine | Admitting: Pain Medicine

## 2022-05-02 ENCOUNTER — Telehealth: Payer: Self-pay

## 2022-05-02 VITALS — BP 150/87 | HR 68 | Temp 97.2°F | Resp 16 | Ht 65.0 in | Wt 350.0 lb

## 2022-05-02 DIAGNOSIS — Z6841 Body Mass Index (BMI) 40.0 and over, adult: Secondary | ICD-10-CM | POA: Diagnosis present

## 2022-05-02 DIAGNOSIS — G8929 Other chronic pain: Secondary | ICD-10-CM | POA: Insufficient documentation

## 2022-05-02 DIAGNOSIS — M25562 Pain in left knee: Secondary | ICD-10-CM | POA: Insufficient documentation

## 2022-05-02 DIAGNOSIS — Z7901 Long term (current) use of anticoagulants: Secondary | ICD-10-CM | POA: Insufficient documentation

## 2022-05-02 DIAGNOSIS — M1712 Unilateral primary osteoarthritis, left knee: Secondary | ICD-10-CM | POA: Insufficient documentation

## 2022-05-02 MED ORDER — METHYLPREDNISOLONE ACETATE 80 MG/ML IJ SUSP
80.0000 mg | Freq: Once | INTRAMUSCULAR | Status: AC
  Administered 2022-05-02: 80 mg

## 2022-05-02 MED ORDER — PENTAFLUOROPROP-TETRAFLUOROETH EX AERO
INHALATION_SPRAY | Freq: Once | CUTANEOUS | Status: AC
  Administered 2022-05-02: 30 via TOPICAL

## 2022-05-02 MED ORDER — LIDOCAINE HCL 2 % IJ SOLN
INTRAMUSCULAR | Status: AC
  Filled 2022-05-02: qty 20

## 2022-05-02 MED ORDER — MIDAZOLAM HCL 5 MG/5ML IJ SOLN
INTRAMUSCULAR | Status: AC
  Filled 2022-05-02: qty 5

## 2022-05-02 MED ORDER — MIDAZOLAM HCL 5 MG/5ML IJ SOLN
0.5000 mg | Freq: Once | INTRAMUSCULAR | Status: AC
  Administered 2022-05-02: 2 mg via INTRAVENOUS

## 2022-05-02 MED ORDER — METHYLPREDNISOLONE ACETATE 80 MG/ML IJ SUSP
INTRAMUSCULAR | Status: AC
  Filled 2022-05-02: qty 1

## 2022-05-02 MED ORDER — ROPIVACAINE HCL 2 MG/ML IJ SOLN
INTRAMUSCULAR | Status: AC
  Filled 2022-05-02: qty 20

## 2022-05-02 MED ORDER — LIDOCAINE HCL 2 % IJ SOLN
20.0000 mL | Freq: Once | INTRAMUSCULAR | Status: AC
  Administered 2022-05-02: 400 mg

## 2022-05-02 MED ORDER — ROPIVACAINE HCL 2 MG/ML IJ SOLN
9.0000 mL | Freq: Once | INTRAMUSCULAR | Status: AC
  Administered 2022-05-02: 9 mL

## 2022-05-02 MED ORDER — LACTATED RINGERS IV SOLN: Freq: Once | INTRAVENOUS | Status: AC

## 2022-05-02 NOTE — Telephone Encounter (Addendum)
Error

## 2022-05-02 NOTE — Patient Instructions (Signed)

## 2022-05-02 NOTE — Progress Notes (Signed)
PROVIDER NOTE: Interpretation of information contained herein should be left to medically-trained personnel. Specific patient instructions are provided elsewhere under "Patient Instructions" section of medical record. This document was created in part using STT-dictation technology, any transcriptional errors that may result from this process are unintentional.  Patient: Jamie Bennett Type: Established DOB: Jul 14, 1961 MRN: [id number] PCP: Lavera Guise, MD  Service: Procedure DOS: 05/02/2022 Setting: Ambulatory Location: Ambulatory outpatient facility Delivery: Face-to-face Provider: Gaspar Cola, MD Specialty: Interventional Pain Management Specialty designation: 09 Location: Outpatient facility Ref. Prov.: Milinda Pointer, MD    Primary Reason for Visit: Interventional Pain Management Treatment. CC: Knee Pain (left)   Procedure:               Type: Genicular Nerves Block (Superolateral, Superomedial, and Inferomedial Genicular Nerves)  #1  Laterality: Left (-LT)  Level: Superior and inferior to the knee joint.  Imaging: Fluoroscopic guidance Anesthesia: Local anesthesia (1-2% Lidocaine) Anxiolysis: IV Versed 2.0 mg Sedation: No Sedation                       DOS: 05/02/2022  Performed by: Gaspar Cola, MD  Purpose: Diagnostic/Therapeutic Indications: Chronic knee pain severe enough to impact quality of life or function. Rationale (medical necessity): procedure needed and proper for the diagnosis and/or treatment of Jamie Bennett's medical symptoms and needs. 1. Chronic knee pain (1ry area of Pain) (Left)   2. Primary osteoarthritis of knee (Left)   3. Tricompartment osteoarthritis of knee (Left)    BMI 50.0-59.9, adult (HCC)    Chronic anticoagulation (Plaquenil)    NAS-11 Pain score:   Pre-procedure: 6 /10   Post-procedure: 4 /10     Target: For Genicular Nerve block(s), the targets are: the superolateral genicular nerve, located in the lateral  distal portion of the femoral shaft as it curves to form the lateral epicondyle, in the region of the distal femoral metaphysis; the superomedial genicular nerve, located in the medial distal portion of the femoral shaft as it curves to form the medial epicondyle; and the inferomedial genicular nerve, located in the medial, proximal portion of the tibial shaft, as it curves to form the medial epicondyle, in the region of the proximal tibial metaphysis.  Location: Superolateral, Superomedial, and Inferomedial aspects of knee joint.  Region: Lateral, Anterior, and Medial aspects of the knee joint, above and below the knee joint proper. Approach: Percutaneous  Type of procedure: Percutaneous perineural nerve block. The genicular nerve block is a motor-sparing technique that anesthetizes the sensory terminal branches innervating the knee joint, resulting in anesthesia of the anterior compartment of the knee. The distribution of anesthesia of each nerve is mostly in the corresponding quadrant.  Neuroanatomy: The superolateral genicular nerve (SLGN) courses around the femur shaft to pass between the vastus lateralis and the lateral epicondyle. It accompanies the superior lateral genicular artery. The superomedial genicular nerve (SMGN) courses around the femur shaft, following the superior medial genicular artery, to pass between the adductor magnus tendon and the medial epicondyle below the vastus medialis. The inferolateral genicular nerve (ILGN) courses around the tibial lateral epicondyle deep to the lateral collateral ligament, following the inferior lateral genicular artery, superior of the fibula head. The inferomedial genicular nerve (IMGN) courses horizontally below the medial collateral ligament between the tibial medial epicondyle and the insertion of the collateral ligament. It accompanies the inferior medial genicular artery. The recurrent peroneal nerve originates in the inferior popliteal region from  the common peroneal nerve and  courses horizontally around the fibula to pass just inferior of the fibula head and travel superior to the anterolateral tibial epicondyle. It accompanies the recurrent tibial artery.  Position / Prep / Materials:  Position: Supine, Modified Fowler's position with pillows under the targeted knee(s). The patient is placed in a supine position with the knee slightly flexed by placing a pillow in the popliteal fossa. Prep solution: DuraPrep (Iodine Povacrylex [0.7% available iodine] and Isopropyl Alcohol, 74% w/w) Prep Area: Entire knee area, from mid-thigh to mid-shin, lateral, anterior, and medial aspects. Materials:  Tray: Block Needle(s):  Type: Spinal  Gauge (G): 22  Length: 3.5-in  Qty: 3  Pre-op H&P Assessment:  Jamie Bennett is a 61 y.o. (year old), female patient, seen today for interventional treatment. She  has a past surgical history that includes Gastric bypass (2003); Tonsillectomy; Colonoscopy (N/A, 07/16/2017); Exploratory laparotomy (2009); Cholecystectomy (2004); laparotomy (N/A, 03/01/2018); Ventral hernia repair (N/A, 03/07/2018); Excision heel spur excision (Bilateral); and Knee arthroscopy with subchondroplasty (Left, 09/27/2021). Ms. Picone has a current medication list which includes the following prescription(s): acetaminophen, albuterol, buspirone, calcium carbonate, cholecalciferol, clonazepam, coenzyme q10, cyanocobalamin, denosumab, desipramine, esomeprazole, estradiol, hydroxychloroquine, hydroxyzine, krill oil, lamotrigine, levothyroxine, loratadine, magnesium oxide, multivitamin, mupirocin ointment, nitrofurantoin, nystatin, polyethylene glycol, psyllium, sucralfate, tizanidine, tramadol, trazodone, venlafaxine, ascorbic acid, and zinc gluconate, and the following Facility-Administered Medications: lactated ringers. Her primarily concern today is the Knee Pain (left)  Initial Vital Signs:  Pulse/HCG Rate: 66  Temp: (!) 97.2 F (36.2  C) Resp: 18 BP: (!) 158/80 SpO2: 99 %  BMI: Estimated body mass index is 58.24 kg/m as calculated from the following:   Height as of this encounter: '5\' 5"'$  (1.651 m).   Weight as of this encounter: 350 lb (158.8 kg).  Risk Assessment: Allergies: Reviewed. She is allergic to lactose intolerance (gi) and sulfa antibiotics.  Allergy Precautions: None required Coagulopathies: Reviewed. None identified.  Blood-thinner therapy: None at this time Active Infection(s): Reviewed. None identified. Ms. Sardinas is afebrile  Site Confirmation: Ms. Schremp was asked to confirm the procedure and laterality before marking the site Procedure checklist: Completed Consent: Before the procedure and under the influence of no sedative(s), amnesic(s), or anxiolytics, the patient was informed of the treatment options, risks and possible complications. To fulfill our ethical and legal obligations, as recommended by the American Medical Association's Code of Ethics, I have informed the patient of my clinical impression; the nature and purpose of the treatment or procedure; the risks, benefits, and possible complications of the intervention; the alternatives, including doing nothing; the risk(s) and benefit(s) of the alternative treatment(s) or procedure(s); and the risk(s) and benefit(s) of doing nothing. The patient was provided information about the general risks and possible complications associated with the procedure. These may include, but are not limited to: failure to achieve desired goals, infection, bleeding, organ or nerve damage, allergic reactions, paralysis, and death. In addition, the patient was informed of those risks and complications associated to the procedure, such as failure to decrease pain; infection; bleeding; organ or nerve damage with subsequent damage to sensory, motor, and/or autonomic systems, resulting in permanent pain, numbness, and/or weakness of one or several areas of the body;  allergic reactions; (i.e.: anaphylactic reaction); and/or death. Furthermore, the patient was informed of those risks and complications associated with the medications. These include, but are not limited to: allergic reactions (i.e.: anaphylactic or anaphylactoid reaction(s)); adrenal axis suppression; blood sugar elevation that in diabetics may result in ketoacidosis or comma; water retention that in  patients with history of congestive heart failure may result in shortness of breath, pulmonary edema, and decompensation with resultant heart failure; weight gain; swelling or edema; medication-induced neural toxicity; particulate matter embolism and blood vessel occlusion with resultant organ, and/or nervous system infarction; and/or aseptic necrosis of one or more joints. Finally, the patient was informed that Medicine is not an exact science; therefore, there is also the possibility of unforeseen or unpredictable risks and/or possible complications that may result in a catastrophic outcome. The patient indicated having understood very clearly. We have given the patient no guarantees and we have made no promises. Enough time was given to the patient to ask questions, all of which were answered to the patient's satisfaction. Ms. Alcindor has indicated that she wanted to continue with the procedure. Attestation: I, the ordering provider, attest that I have discussed with the patient the benefits, risks, side-effects, alternatives, likelihood of achieving goals, and potential problems during recovery for the procedure that I have provided informed consent. Date  Time: 05/02/2022  9:22 AM  Pre-Procedure Preparation:  Monitoring: As per clinic protocol. Respiration, ETCO2, SpO2, BP, heart rate and rhythm monitor placed and checked for adequate function Safety Precautions: Patient was assessed for positional comfort and pressure points before starting the procedure. Time-out: I initiated and conducted the  "Time-out" before starting the procedure, as per protocol. The patient was asked to participate by confirming the accuracy of the "Time Out" information. Verification of the correct person, site, and procedure were performed and confirmed by me, the nursing staff, and the patient. "Time-out" conducted as per Joint Commission's Universal Protocol (UP.01.01.01). Time: 1007  Description/Narrative of Procedure:          Rationale (medical necessity): procedure needed and proper for the diagnosis and/or treatment of the patient's medical symptoms and needs. Procedural Technique Safety Precautions: Aspiration looking for blood return was conducted prior to all injections. At no point did we inject any substances, as a needle was being advanced. No attempts were made at seeking any paresthesias. Safe injection practices and needle disposal techniques used. Medications properly checked for expiration dates. SDV (single dose vial) medications used. Description of the Procedure: Protocol guidelines were followed. The patient was assisted into a comfortable position. The target area was identified and the area prepped in the usual manner. Skin & deeper tissues infiltrated with local anesthetic. Appropriate amount of time allowed to pass for local anesthetics to take effect. The procedure needles were then advanced to the target area. Proper needle placement secured. Negative aspiration confirmed. Solution injected in intermittent fashion, asking for systemic symptoms every 0.5cc of injectate. The needles were then removed and the area cleansed, making sure to leave some of the prepping solution back to take advantage of its long term bactericidal properties.             Vitals:   05/02/22 1004 05/02/22 1009 05/02/22 1014 05/02/22 1019  BP: (!) 129/52 (!) 119/56 (!) 133/59 (!) 150/87  Pulse: 65 62 63 68  Resp: '16 16 13 16  '$ Temp:      SpO2: 99% 100% 100% 100%  Weight:      Height:         Start Time: 1007  hrs. End Time: 1014 hrs.  Imaging Guidance (Non-Spinal):          Type of Imaging Technique: Fluoroscopy Guidance (Non-Spinal) Indication(s): Assistance in needle guidance and placement for procedures requiring needle placement in or near specific anatomical locations not easily accessible without such  assistance. Exposure Time: Please see nurses notes. Contrast: None used. Fluoroscopic Guidance: I was personally present during the use of fluoroscopy. "Tunnel Vision Technique" used to obtain the best possible view of the target area. Parallax error corrected before commencing the procedure. "Direction-depth-direction" technique used to introduce the needle under continuous pulsed fluoroscopy. Once target was reached, antero-posterior, oblique, and lateral fluoroscopic projection used confirm needle placement in all planes. Images permanently stored in EMR. Interpretation: No contrast injected. I personally interpreted the imaging intraoperatively. Adequate needle placement confirmed in multiple planes. Permanent images saved into the patient's record.  Post-operative Assessment:  Post-procedure Vital Signs:  Pulse/HCG Rate: 68  Temp: (!) 97.2 F (36.2 C) Resp: 16 BP: (!) 150/87 SpO2: 100 %  EBL: None  Complications: No immediate post-treatment complications observed by team, or reported by patient.  Note: The patient tolerated the entire procedure well. A repeat set of vitals were taken after the procedure and the patient was kept under observation following institutional policy, for this type of procedure. Post-procedural neurological assessment was performed, showing return to baseline, prior to discharge. The patient was provided with post-procedure discharge instructions, including a section on how to identify potential problems. Should any problems arise concerning this procedure, the patient was given instructions to immediately contact us, at any time, without hesitation. In any case,  we plan to contact the patient by telephone for a follow-up status report regarding this interventional procedure.  Comments:  No additional relevant information.  Plan of Care  Orders:  Orders Placed This Encounter  Procedures   GENICULAR NERVE BLOCK    Scheduling Instructions:     Side(s): Left Knee     Level(s): Superior-Lateral, Superior-Medial, and Inferior-Medial Genicular Nerves     Sedation: Patient's choice.     Timeframe: Today    Order Specific Question:   Where will this procedure be performed?    Answer:   [facility]   DG [facility] C-ARM 1-60 MIN NO REPORT    Intraoperative interpretation by procedural physician at [facility].    Standing Status:   Standing    Number of Occurrences:   1    Order Specific Question:   Reason for exam:    Answer:   Assistance in needle guidance and placement for procedures requiring needle placement in or near specific anatomical locations not easily accessible without such assistance.   Informed Consent Details: Physician/Practitioner Attestation; Transcribe to consent form and obtain patient signature    Note: Always confirm laterality of pain with Ms. Heroux, before procedure. Transcribe to consent form and obtain patient signature.    Order Specific Question:   Physician/Practitioner attestation of informed consent for procedure/surgical case    Answer:   I, the physician/practitioner, attest that I have discussed with the patient the benefits, risks, side effects, alternatives, likelihood of achieving goals and potential problems during recovery for the procedure that I have provided informed consent.    Order Specific Question:   Procedure    Answer:   Block of the genicular nerves of the knee (Genicular Nerves Block)    Order Specific Question:   Physician/Practitioner performing the procedure    Answer:   Kensie Susman A. Dossie Arbour, MD    Order Specific Question:   Indication/Reason    Answer:   Chronic knee  pain   Care order/instruction: Please confirm that the patient has stopped the Plaquenil (Hydroxychloroquine) x 11 days prior to procedure or surgery.    Please confirm that the patient  has stopped the Plaquenil (Hydroxychloroquine) x 11 days prior to procedure or surgery.    Standing Status:   Standing    Number of Occurrences:   1   Provide equipment / supplies at bedside    "Block Tray" (Disposable  single use) Needle type: SpinalSpinal Amount/quantity: 3 Size: Regular (3.5-inch) Gauge: 22G    Standing Status:   Standing    Number of Occurrences:   1    Order Specific Question:   Specify    Answer:   Block Tray   Bleeding precautions    Standing Status:   Standing    Number of Occurrences:   1   Chronic Opioid Analgesic:  Tramadol 50 mg tablet, 2 tabs p.o. 4 times daily (# 60) (last filled on 04/13/2022); hydrocodone/APAP 5/325, 1 tab p.o. 3 times daily (# 8) (last filled on 04/03/2022) MME/day: 88.33 mg/day   Medications ordered for procedure: Meds ordered this encounter  Medications   lidocaine (XYLOCAINE) 2 % (with pres) injection 400 mg   pentafluoroprop-tetrafluoroeth (GEBAUERS) aerosol   lactated ringers infusion   midazolam (VERSED) 5 MG/5ML injection 0.5-2 mg    Make sure Flumazenil is available in the pyxis when using this medication. If oversedation occurs, administer 0.2 mg IV over 15 sec. If after 45 sec no response, administer 0.2 mg again over 1 min; may repeat at 1 min intervals; not to exceed 4 doses (1 mg)   ropivacaine (PF) 2 mg/mL (0.2%) (NAROPIN) injection 9 mL   methylPREDNISolone acetate (DEPO-MEDROL) injection 80 mg   Medications administered: We administered lidocaine, pentafluoroprop-tetrafluoroeth, lactated ringers, midazolam, ropivacaine (PF) 2 mg/mL (0.2%), and methylPREDNISolone acetate.  See the medical record for exact dosing, route, and time of administration.  Follow-up plan:   Return in about 2 weeks (around 05/16/2022) for Proc-day (T,Th),  (F2F), (PPE).       Interventional Therapies  Risk  Complexity Considerations:   Estimated body mass index is 57.91 kg/m as calculated from the following:   Height as of this encounter: '5\' 5"'$  (1.651 m).   Weight as of this encounter: 348 lb (157.9 kg). Plaquenil ANTICOAGULATION (Stop: 11 days) WNL   Planned  Pending:   Diagnostic left genicular nerve blocks #1    Under consideration:   Diagnostic left genicular nerve blocks #1    Completed:   None at this time   Completed by other providers:   None at this time   Therapeutic  Palliative (PRN) options:   None established     Recent Visits Date Type Provider Dept  04/19/22 Office Visit Milinda Pointer, MD [facility]  Showing recent visits within past 90 days and meeting all other requirements Today's Visits Date Type Provider Dept  05/02/22 Procedure visit Milinda Pointer, MD [facility]  Showing today's visits and meeting all other requirements Future Appointments Date Type Provider Dept  05/18/22 Appointment Milinda Pointer, MD [facility]  Showing future appointments within next 90 days and meeting all other requirements  Disposition: Discharge home  Discharge (Date  Time): 05/02/2022; 1030 hrs.   Primary Care Physician: Lavera Guise, MD Location: [facility] Note by: Gaspar Cola, MD Date: 05/02/2022; Time: 10:32 AM  Disclaimer:  Medicine is not an exact science. The only guarantee in medicine is that nothing is guaranteed. It is important to note that the decision to proceed with this intervention was based on the information collected from the patient. The Data and conclusions were drawn from the  patient's questionnaire, the interview, and the physical examination. Because the information was provided in large part by the patient, it cannot be guaranteed that it has not been purposely or unconsciously manipulated. Every effort has been  made to obtain as much relevant data as possible for this evaluation. It is important to note that the conclusions that lead to this procedure are derived in large part from the available data. Always take into account that the treatment will also be dependent on availability of resources and existing treatment guidelines, considered by other Pain Management Practitioners as being common knowledge and practice, at the time of the intervention. For Medico-Legal purposes, it is also important to point out that variation in procedural techniques and pharmacological choices are the acceptable norm. The indications, contraindications, technique, and results of the above procedure should only be interpreted and judged by a Board-Certified Interventional Pain Specialist with extensive familiarity and expertise in the same exact procedure and technique.

## 2022-05-02 NOTE — Progress Notes (Signed)
Safety precautions to be maintained throughout the outpatient stay will include: orient to surroundings, keep bed in low position, maintain call bell within reach at all times, provide assistance with transfer out of bed and ambulation.  

## 2022-05-02 NOTE — Telephone Encounter (Signed)
Spoke with patient and explained to patient we will not be able to send a 90 day supply of her medication until we receive her updated PLQ eye exam. Patient advised we sent her refill to the pharmacy that [facility] the refill [facility]. Patient will remind eye doctor to send results for PLQ eye exam.

## 2022-05-02 NOTE — Telephone Encounter (Signed)
Patient called stating that she would like a 90 day supply of Plaquenil sent to [facility] instead of 30 day supply to [facility]. She also stated she will be coming in to draw labs next week. Patient stated that eye exam will be faxed soon. Please advise thanks!

## 2022-05-03 ENCOUNTER — Telehealth: Payer: Self-pay | Admitting: *Deleted

## 2022-05-03 NOTE — Telephone Encounter (Signed)
No problems post procedure. 

## 2022-05-05 ENCOUNTER — Other Ambulatory Visit: Payer: Self-pay | Admitting: Internal Medicine

## 2022-05-08 ENCOUNTER — Other Ambulatory Visit: Payer: Self-pay | Admitting: Rheumatology

## 2022-05-08 ENCOUNTER — Other Ambulatory Visit: Payer: Self-pay

## 2022-05-08 DIAGNOSIS — M0579 Rheumatoid arthritis with rheumatoid factor of multiple sites without organ or systems involvement: Secondary | ICD-10-CM

## 2022-05-08 MED ORDER — TRAMADOL HCL 50 MG PO TABS
50.0000 mg | ORAL_TABLET | Freq: Two times a day (BID) | ORAL | 1 refills | Status: DC | PRN
  Filled 2022-05-08: qty 45, fill #0

## 2022-05-08 MED ORDER — HYDROXYCHLOROQUINE SULFATE 200 MG PO TABS: 200.0000 mg | ORAL_TABLET | Freq: Two times a day (BID) | ORAL | 0 refills | Status: DC

## 2022-05-08 NOTE — Telephone Encounter (Signed)
Last 8/23 and next 11/23

## 2022-05-08 NOTE — Telephone Encounter (Signed)
Next Visit: 08/22/2022  Last Visit: 03/22/2022  Labs: 01/10/2022 WNL  Eye exam: 04/24/2022 WNL   Current Dose per office note 03/22/2022: Plaquenil 200 mg 1 tablet by mouth twice daily.  [id number] arthritis involving multiple sites with positive rheumatoid factor   Last Fill: 04/28/2022 (30 day supply)  Okay to refill Plaquenil? (Requesting 90 day supply)

## 2022-05-08 NOTE — Telephone Encounter (Signed)
Patient called the office requesting a refill of Plaquenil '200mg'$  be sent to [facility] in [place].

## 2022-05-10 ENCOUNTER — Other Ambulatory Visit: Payer: Self-pay | Admitting: *Deleted

## 2022-05-10 ENCOUNTER — Ambulatory Visit (HOSPITAL_BASED_OUTPATIENT_CLINIC_OR_DEPARTMENT_OTHER): Payer: Medicare HMO | Admitting: Psychiatry

## 2022-05-10 DIAGNOSIS — F603 Borderline personality disorder: Secondary | ICD-10-CM

## 2022-05-10 DIAGNOSIS — F325 Major depressive disorder, single episode, in full remission: Secondary | ICD-10-CM | POA: Diagnosis not present

## 2022-05-10 DIAGNOSIS — F411 Generalized anxiety disorder: Secondary | ICD-10-CM

## 2022-05-10 DIAGNOSIS — F332 Major depressive disorder, recurrent severe without psychotic features: Secondary | ICD-10-CM | POA: Diagnosis not present

## 2022-05-10 DIAGNOSIS — Z79899 Other long term (current) drug therapy: Secondary | ICD-10-CM | POA: Diagnosis not present

## 2022-05-10 DIAGNOSIS — R69 Illness, unspecified: Secondary | ICD-10-CM | POA: Diagnosis not present

## 2022-05-10 MED ORDER — DESIPRAMINE HCL 25 MG PO TABS: 25.0000 mg | ORAL_TABLET | Freq: Every day | ORAL | 0 refills | Status: DC

## 2022-05-10 MED ORDER — BUSPIRONE HCL 30 MG PO TABS: ORAL_TABLET | ORAL | 2 refills | Status: DC

## 2022-05-10 MED ORDER — CLONAZEPAM 0.5 MG PO TABS: ORAL_TABLET | ORAL | 5 refills | Status: DC

## 2022-05-10 MED ORDER — HYDROXYZINE HCL 50 MG PO TABS: 50.0000 mg | ORAL_TABLET | Freq: Three times a day (TID) | ORAL | 4 refills | Status: DC | PRN

## 2022-05-10 MED ORDER — TRAZODONE HCL 100 MG PO TABS: 100.0000 mg | ORAL_TABLET | Freq: Every evening | ORAL | 5 refills | Status: DC | PRN

## 2022-05-10 MED ORDER — VENLAFAXINE HCL 100 MG PO TABS: 100.0000 mg | ORAL_TABLET | Freq: Three times a day (TID) | ORAL | 2 refills | Status: DC

## 2022-05-10 MED ORDER — LAMOTRIGINE 150 MG PO TABS: 150.0000 mg | ORAL_TABLET | Freq: Two times a day (BID) | ORAL | 1 refills | Status: DC

## 2022-05-10 NOTE — Progress Notes (Signed)
`  Psychiatric Initial Adult Assessment   Patient Identification: Jamie Bennett MRN:  [id number] Date of Evaluation:  05/10/2022 Referral Source: grams per previous psychiatrist Chief Complaint:   Visit Diagnosis: borderline personality disorder   ICD-10-CM   1. Borderline personality disorder (HCC)  F60.3 lamoTRIgine (LAMICTAL) 150 MG tablet    venlafaxine (EFFEXOR) 100 MG tablet    traZODone (DESYREL) 100 MG tablet    2. Major depressive disorder, recurrent, severe without psychotic features (HCC)  F33.2 lamoTRIgine (LAMICTAL) 150 MG tablet    venlafaxine (EFFEXOR) 100 MG tablet    traZODone (DESYREL) 100 MG tablet    3. Anxiety state  F41.1 venlafaxine (EFFEXOR) 100 MG tablet    busPIRone (BUSPAR) 30 MG tablet    hydrOXYzine (ATARAX) 50 MG tablet    clonazePAM (KLONOPIN) 0.5 MG tablet    DISCONTINUED: hydrOXYzine (ATARAX) 50 MG tablet       History of Pre   Today the patient seems to be pretty well.  Her knee is better.  She has some injections.  The patient takes her medicines as prescribed.  Her biggest stress is that they need a new roof to the home.  She likes her home she lives in and she does it.  Financially they are stable.  The patient has 1 child who is 12 years old.  He has 1 child.  Patient denies daily depression.  She enjoys reading watching TV and other activities.  She is sleeping and eating well has good energy has no problems thinking and concentrating.  She drinks no alcohol and uses no drugs.  Her depression is very well-controlled.  She has minimal anxiety at this time. Depression Symptoms:  fatigue, (Hypo) Manic Symptoms:   Anxiety Symptoms:   Psychotic Symptoms:   PTSD Symptoms:   Past Psychiatric History: 10 psychiatric hospitalizations multiple psychotropic medications presently in psychotherapy  Previous Psychotropic Medications: Yes   Substance Abuse History in the last 12 months:  Yes.    Consequences of Substance Abuse:   Past Medical  History:  Past Medical History:  Diagnosis Date   Anemia    Anxiety    Bipolar 1 disorder ([place])    Collagen vascular disease ([place])    rhematoid arthritis   Constipation    Dyspnea    Fibromyalgia    GERD (gastroesophageal reflux disease)    Heart murmur    mild-asymptomatic   Hepatic steatosis    History of kidney stones    History of Roux-en-Y gastric bypass    Hypotension    Hypothyroidism    Morbid obesity with BMI of 50.0-59.9, adult ([place])    Opioid abuse ([place])    Osteoarthritis    Osteoporosis    PONV (postoperative nausea and vomiting)    nausea only   Rheumatic fever    Rheumatoid arthritis ([place])    Small bowel obstruction ([place])    Thyroid disease     Past Surgical History:  Procedure Laterality Date   CHOLECYSTECTOMY  2004   COLONOSCOPY N/A 07/16/2017   Procedure: COLONOSCOPY;  Surgeon: Manya Silvas, MD;  [facility]: [facility];  Service: Endoscopy;  Laterality: N/A;   EXPLORATORY LAPAROTOMY  2009   GASTRIC BYPASS  2003   [facility]   HEEL SPUR EXCISION Bilateral    KNEE ARTHROSCOPY WITH SUBCHONDROPLASTY Left 09/27/2021   Procedure: Left knee subchondroplasty medial tibial plateau;  Surgeon: Hessie Knows, MD;  Location: [facility] ORS;  Service: Orthopedics;  Laterality: Left;   LAPAROTOMY N/A 03/01/2018  Procedure: EXPLORATORY LAPAROTOMY/ UMBILECTOMY;  Surgeon: Robert Bellow, MD;  Location: [facility] ORS;  Service: General;  Laterality: N/A;   TONSILLECTOMY     VENTRAL HERNIA REPAIR N/A 03/07/2018   Procedure: HERNIA REPAIR VENTRAL ADULT;  Surgeon: Robert Bellow, MD;  Location: [facility] ORS;  Service: General;  Laterality: N/A;    Family Psychiatric History:   Family History:  Family History  Problem Relation Age of Onset   Depression Mother    Dementia Mother    Heart disease Mother    Osteoporosis Mother    Parkinson's disease Father    Liver disease Brother    Colon cancer Neg Hx     Social History:   Social History   Socioeconomic  History   Marital status: Married    Spouse name: Not on file   Number of children: Not on file   Years of education: Not on file   Highest education level: Not on file  Occupational History   Not on file  Tobacco Use   Smoking status: Never    Passive exposure: Past   Smokeless tobacco: Never  Vaping Use   Vaping Use: Never used  Substance and Sexual Activity   Alcohol use: Not Currently   Drug use: No   Sexual activity: Yes  Other Topics Concern   Not on file  Social History Narrative   Not on file   Social Determinants of Health   Financial Resource Strain: Not on file  Food Insecurity: Not on file  Transportation Needs: Not on file  Physical Activity: Not on file  Stress: Not on file  Social Connections: Not on file    Additional Social History:   Allergies:   Allergies  Allergen Reactions   Lactose Intolerance (Gi) Other (See Comments)    Bloating and GI distress   Sulfa Antibiotics Hives    Metabolic Disorder Labs: Lab Results  Component Value Date   HGBA1C 5.0 09/01/2021   No results found for: "PROLACTIN" Lab Results  Component Value Date   CHOL 169 05/26/2020   TRIG 136 05/26/2020   HDL 73 05/26/2020   LDLCALC 73 05/26/2020     Current Medications: Current Outpatient Medications  Medication Sig Dispense Refill   acetaminophen (TYLENOL) 500 MG tablet Take 500-1,000 mg by mouth every 6 (six) hours as needed (pain.).     albuterol (VENTOLIN HFA) 108 (90 Base) MCG/ACT inhaler Inhale 2 puffs into the lungs every 6 (six) hours as needed for wheezing or shortness of breath. 8 g 3   busPIRone (BUSPAR) 30 MG tablet 1 bid 180 tablet 2   Calcium Carbonate (CALCIUM 600 PO) Take 600 mg by mouth in the morning and at bedtime.     Cholecalciferol 125 MCG (5000 UT) TABS Take 5,000 Units by mouth in the morning.     clonazePAM (KLONOPIN) 0.5 MG tablet TAKE 1 TABLET BY MOUTH ONCE DAILY AS NEEDED FOR  ACUTE  ANXIETY. Pt may take 1 extra tablet (0.5 mg) PRN as  needed for severe anxiety. 35 tablet 5   Coenzyme Q10 300 MG CAPS Take 300 mg by mouth in the morning.     cyanocobalamin (,VITAMIN B-12,) 1000 MCG/ML injection Inject 1,000 mcg into the muscle every 30 (thirty) days.      denosumab (PROLIA) 60 MG/ML SOSY injection Inject 60 mg into the skin every 6 (six) months.     desipramine (NORPRAMIN) 25 MG tablet Take 1 tablet (25 mg total) by mouth daily. 90 tablet 0  esomeprazole (NEXIUM) 20 MG capsule Take 20 mg by mouth in the morning.     estradiol (ESTRACE) 0.1 MG/GM vaginal cream Apply a pea-sized amount to index finger and wipe in vaginal introitus twice weekly 42.5 g 12   hydroxychloroquine (PLAQUENIL) 200 MG tablet Take 1 tablet (200 mg total) by mouth 2 (two) times daily. 180 tablet 0   hydrOXYzine (ATARAX) 50 MG tablet Take 1 tablet (50 mg total) by mouth 3 (three) times daily as needed for anxiety. 120 tablet 4   Krill Oil 500 MG CAPS Take 500 mg by mouth daily at 12 noon.     lamoTRIgine (LAMICTAL) 150 MG tablet Take 1 tablet (150 mg total) by mouth 2 (two) times daily. 180 tablet 1   levothyroxine (SYNTHROID) 112 MCG tablet Take 1 tablet (112 mcg total) by mouth daily before breakfast. 90 tablet 1   loratadine (CLARITIN) 10 MG tablet Take 10 mg by mouth daily as needed for allergies.     magnesium oxide (MAG-OX) 400 MG tablet Take 400 mg by mouth every evening.     Multiple Vitamin (MULTIVITAMIN) tablet Take 1 tablet by mouth every evening.     mupirocin ointment (BACTROBAN) 2 % Apply 1 Application topically 2 (two) times daily. 30 g 3   nitrofurantoin (MACRODANTIN) 50 MG capsule Take 1 capsule (50 mg total) by mouth daily. 90 capsule 1   nystatin (MYCOSTATIN/NYSTOP) powder Apply 1 Application topically 2 (two) times daily. 30 g 6   polyethylene glycol (MIRALAX / GLYCOLAX) 17 g packet Take 17 g by mouth in the morning and at bedtime.     psyllium (METAMUCIL) 58.6 % packet Take 1 packet by mouth in the morning.     sucralfate (CARAFATE) 1 g  tablet Take 1 tablet (1 g total) by mouth 2 (two) times daily. 180 tablet 1   tiZANidine (ZANAFLEX) 4 MG tablet Take 1 tablet (4 mg total) by mouth 3 (three) times daily. 90 tablet 1   traMADol (ULTRAM) 50 MG tablet Take 1 tablet (50 mg total) by mouth 2 (two) times daily as needed for pain. 45 tablet 1   traZODone (DESYREL) 100 MG tablet Take 1 tablet (100 mg total) by mouth at bedtime as needed and may repeat dose one time if needed for sleep. 60 tablet 5   venlafaxine (EFFEXOR) 100 MG tablet Take 1 tablet (100 mg total) by mouth 3 (three) times daily with meals. 270 tablet 2   vitamin C (ASCORBIC ACID) 500 MG tablet Take 500 mg by mouth in the morning.     zinc gluconate 50 MG tablet Take 50 mg by mouth in the morning.     No current facility-administered medications for this visit.    Neurologic: Headache: No Seizure: No Paresthesias:NA  Musculoskeletal: Strength & Muscle Tone: within normal limits Gait & Station: normal Patient leans: N/A  Psychiatric Specialty Exam: ROS  There were no vitals taken for this visit.There is no height or weight on file to calculate BMI.  General Appearance: Bizarre  Eye Contact:  Good  Speech:  Normal Rate  Volume:  Normal  Mood:  Anxious  Affect:  Congruent  Thought Process:  Goal Directed  Orientation:  NA  Thought Content:  Logical  Suicidal Thoughts:  No  Homicidal Thoughts:  No  Memory:  Negative  Judgement:  Fair  Insight:  NA and Good  Psychomotor Activity:  Normal  Concentration:    Recall:  Good  Fund of Knowledge:  Language: Good  Akathisia:  No  Handed:  Right  AIMS (if indicated):    Assets:  Desire for Improvement  ADL's:  Intact  Cognition: WNL  Sleep:      Treatment Plan Summary: This patient is diagnosed with borderline personality disorder.  At this time she her mood instability is well controlled with Lamictal.  Her second problem is major depression.  She continues taking Effexor and desipramine as prescribed.   Her third problem is adjustment disorder with an anxious mood state which is very well-controlled with BuSpar and Vistaril.  The patient drinks no alcohol and uses no drugs.  She is very stable in many ways.  She will return to see me in 5 months. 10/11/20233:13 PM

## 2022-05-11 LAB — COMPLETE METABOLIC PANEL WITH GFR
AG Ratio: 1.7 (calc) (ref 1.0–2.5)
ALT: 9 U/L (ref 6–29)
AST: 13 U/L (ref 10–35)
Albumin: 4.2 g/dL (ref 3.6–5.1)
Alkaline phosphatase (APISO): 92 U/L (ref 37–153)
BUN: 20 mg/dL (ref 7–25)
CO2: 21 mmol/L (ref 20–32)
Calcium: 9.4 mg/dL (ref 8.6–10.4)
Chloride: 109 mmol/L (ref 98–110)
Creat: 0.75 mg/dL (ref 0.50–1.05)
Globulin: 2.5 g/dL (calc) (ref 1.9–3.7)
Glucose, Bld: 85 mg/dL (ref 65–99)
Potassium: 3.7 mmol/L (ref 3.5–5.3)
Sodium: 142 mmol/L (ref 135–146)
Total Bilirubin: 0.3 mg/dL (ref 0.2–1.2)
Total Protein: 6.7 g/dL (ref 6.1–8.1)
eGFR: 91 mL/min/{1.73_m2} (ref >=60)

## 2022-05-11 LAB — CBC WITH DIFFERENTIAL/PLATELET
Absolute Monocytes: 442 cells/uL (ref 200–950)
Basophils Absolute: 21 cells/uL (ref 0–200)
Basophils Relative: 0.4 %
Eosinophils Absolute: 192 cells/uL (ref 15–500)
Eosinophils Relative: 3.7 %
HCT: 38.5 % (ref 35.0–45.0)
Hemoglobin: 12.9 g/dL (ref 11.7–15.5)
Lymphs Abs: 1893 cells/uL (ref 850–3900)
MCH: 29.6 pg (ref 27.0–33.0)
MCHC: 33.5 g/dL (ref 32.0–36.0)
MCV: 88.3 fL (ref 80.0–100.0)
MPV: 9.4 fL (ref 7.5–12.5)
Monocytes Relative: 8.5 %
Neutro Abs: 2652 cells/uL (ref 1500–7800)
Neutrophils Relative %: 51 %
Platelets: 226 10*3/uL (ref 140–400)
RBC: 4.36 10*6/uL (ref 3.80–5.10)
RDW: 12.3 % (ref 11.0–15.0)
Total Lymphocyte: 36.4 %
WBC: 5.2 10*3/uL (ref 3.8–10.8)

## 2022-05-11 NOTE — Progress Notes (Signed)
CBC and CMP are normal.

## 2022-05-12 ENCOUNTER — Inpatient Hospital Stay: Payer: Medicare HMO | Attending: Oncology

## 2022-05-12 DIAGNOSIS — Z79899 Other long term (current) drug therapy: Secondary | ICD-10-CM | POA: Insufficient documentation

## 2022-05-12 DIAGNOSIS — E538 Deficiency of other specified B group vitamins: Secondary | ICD-10-CM | POA: Insufficient documentation

## 2022-05-12 DIAGNOSIS — D519 Vitamin B12 deficiency anemia, unspecified: Secondary | ICD-10-CM | POA: Diagnosis not present

## 2022-05-12 MED ORDER — CYANOCOBALAMIN 1000 MCG/ML IJ SOLN
1000.0000 ug | Freq: Once | INTRAMUSCULAR | Status: AC
  Administered 2022-05-12: 1000 ug via INTRAMUSCULAR
  Filled 2022-05-12: qty 1

## 2022-05-13 NOTE — Progress Notes (Unsigned)
PROVIDER NOTE: Information contained herein reflects review and annotations entered in association with encounter. Interpretation of such information and data should be left to medically-trained personnel. Information provided to patient can be located elsewhere in the medical record under "Patient Instructions". Document created using STT-dictation technology, any transcriptional errors that may result from process are unintentional.    Patient: Jamie Bennett  Service Category: E/M  Provider: Gaspar Cola, MD  DOB: 07/10/1961  DOS: 05/18/2022  Referring Provider: Lavera Guise, MD  MRN: [id number]  Specialty: Interventional Pain Management  PCP: Lavera Guise, MD  Type: Established Patient  Setting: Ambulatory outpatient    Location: Office  Delivery: Face-to-face     HPI  Jamie Bennett, a 61 y.o. year old female, is here today because of her Chronic pain of left knee [M25.562, G89.29]. Jamie Bennett primary complain today is No chief complaint on file. Last encounter: My last encounter with her was on 05/02/2022. Pertinent problems: Jamie Bennett has Arthritis or polyarthritis, rheumatoid ([place]); Arthritis of pelvic region, degenerative; Rheumatoid arthritis ([place]); Fibromyalgia; Primary osteoarthritis of knees (Bilateral); Closed fracture of elbow (Left); Neck pain, musculoskeletal; Chronic pain syndrome; Chronic knee pain (1ry area of Pain) (Left); Tricompartment osteoarthritis of knee (Left); and Primary osteoarthritis of knee (Left) on their pertinent problem list. Pain Assessment: Severity of   is reported as a  /10. Location:    / . Onset:  . Quality:  . Timing:  . Modifying factor(s):  Marland Kitchen Vitals:  vitals were not taken for this visit.   Reason for encounter: post-procedure evaluation and assessment. ***  Post-procedure evaluation   Type: Genicular Nerves Block (Superolateral, Superomedial, and Inferomedial Genicular Nerves)  #1  Laterality: Left (-LT)  Level:  Superior and inferior to the knee joint.  Imaging: Fluoroscopic guidance Anesthesia: Local anesthesia (1-2% Lidocaine) Anxiolysis: IV Versed 2.0 mg Sedation: No Sedation                       DOS: 05/02/2022  Performed by: Gaspar Cola, MD  Purpose: Diagnostic/Therapeutic Indications: Chronic knee pain severe enough to impact quality of life or function. Rationale (medical necessity): procedure needed and proper for the diagnosis and/or treatment of Ms. Hai's medical symptoms and needs. 1. Chronic knee pain (1ry area of Pain) (Left)   2. Primary osteoarthritis of knee (Left)   3. Tricompartment osteoarthritis of knee (Left)    BMI 50.0-59.9, adult (HCC)    Chronic anticoagulation (Plaquenil)    NAS-11 Pain score:   Pre-procedure: 6 /10   Post-procedure: 4 /10      Effectiveness:  Initial hour after procedure:   ***. Subsequent 4-6 hours post-procedure:   ***. Analgesia past initial 6 hours:   ***. Ongoing improvement:  Analgesic:  *** Function:    ***    ROM:    ***     Pharmacotherapy Assessment  Analgesic: Tramadol 50 mg tablet, 2 tabs p.o. 4 times daily (# 60) (last filled on 04/13/2022); hydrocodone/APAP 5/325, 1 tab p.o. 3 times daily (# 8) (last filled on 04/03/2022) MME/day: 88.33 mg/day   Monitoring: [place] PMP: PDMP reviewed during this encounter.       Pharmacotherapy: No side-effects or adverse reactions reported. Compliance: No problems identified. Effectiveness: Clinically acceptable.  No notes on file  No results found for: "CBDTHCR" No results found for: "D8THCCBX" No results found for: "D9THCCBX"  UDS:  No results found for: "SUMMARY"    ROS  Constitutional: Denies any fever  or chills Gastrointestinal: No reported hemesis, hematochezia, vomiting, or acute GI distress Musculoskeletal: Denies any acute onset joint swelling, redness, loss of ROM, or weakness Neurological: No reported episodes of acute onset apraxia, aphasia, dysarthria,  agnosia, amnesia, paralysis, loss of coordination, or loss of consciousness  Medication Review  Calcium Carbonate, Cholecalciferol, Coenzyme Q10, Krill Oil, acetaminophen, albuterol, ascorbic acid, busPIRone, clonazePAM, cyanocobalamin, denosumab, desipramine, esomeprazole, estradiol, hydrOXYzine, hydroxychloroquine, lamoTRIgine, levothyroxine, loratadine, magnesium oxide, multivitamin, mupirocin ointment, nitrofurantoin, nystatin, polyethylene glycol, psyllium, sucralfate, tiZANidine, traMADol, traZODone, venlafaxine, and zinc gluconate  History Review  Allergy: Jamie Bennett is allergic to lactose intolerance (gi) and sulfa antibiotics. Drug: Jamie Bennett  reports no history of drug use. Alcohol:  reports that she does not currently use alcohol. Tobacco:  reports that she has never smoked. She has been exposed to tobacco smoke. She has never used smokeless tobacco. Social: Jamie Bennett  reports that she has never smoked. She has been exposed to tobacco smoke. She has never used smokeless tobacco. She reports that she does not currently use alcohol. She reports that she does not use drugs. Medical:  has a past medical history of Anemia, Anxiety, Bipolar 1 disorder ([place]), Collagen vascular disease ([place]), Constipation, Dyspnea, Fibromyalgia, GERD (gastroesophageal reflux disease), Heart murmur, Hepatic steatosis, History of kidney stones, History of Roux-en-Y gastric bypass, Hypotension, Hypothyroidism, Morbid obesity with BMI of 50.0-59.9, adult ([place]), Opioid abuse ([place]), Osteoarthritis, Osteoporosis, PONV (postoperative nausea and vomiting), Rheumatic fever, Rheumatoid arthritis ([place]), Small bowel obstruction ([place]), and Thyroid disease. Surgical: Jamie Bennett  has a past surgical history that includes Gastric bypass (2003); Tonsillectomy; Colonoscopy (N/A, 07/16/2017); Exploratory laparotomy (2009); Cholecystectomy (2004); laparotomy (N/A, 03/01/2018); Ventral hernia repair (N/A, 03/07/2018);  Excision heel spur excision (Bilateral); and Knee arthroscopy with subchondroplasty (Left, 09/27/2021). Family: family history includes Dementia in her mother; Depression in her mother; Heart disease in her mother; Liver disease in her brother; Osteoporosis in her mother; Parkinson's disease in her father.  Laboratory Chemistry Profile   Renal Lab Results  Component Value Date   BUN 20 05/10/2022   CREATININE 0.75 05/10/2022   BCR SEE NOTE: 05/10/2022   GFRAA 99 05/26/2020   GFRNONAA >60 01/10/2022    Hepatic Lab Results  Component Value Date   AST 13 05/10/2022   ALT 9 05/10/2022   ALBUMIN 3.9 01/10/2022   ALKPHOS 105 01/10/2022   LIPASE 24 03/07/2018   AMMONIA 161 (H) 11/09/2015    Electrolytes Lab Results  Component Value Date   NA 142 05/10/2022   K 3.7 05/10/2022   CL 109 05/10/2022   CALCIUM 9.4 05/10/2022   MG 1.7 11/19/2015   PHOS 2.8 11/14/2015    Bone Lab Results  Component Value Date   VD25OH 55.9 05/26/2020    Inflammation (CRP: Acute Phase) (ESR: Chronic Phase) Lab Results  Component Value Date   ESRSEDRATE 27 10/15/2017   LATICACIDVEN 1.7 03/07/2018         Note: Above Lab results reviewed.  Recent Imaging Review  DG PAIN CLINIC C-ARM 1-60 MIN NO REPORT Fluoro was used, but no Radiologist interpretation will be provided.  Please refer to "NOTES" tab for provider progress note. Note: Reviewed        Physical Exam  General appearance: Well nourished, well developed, and well hydrated. In no apparent acute distress Mental status: Alert, oriented x 3 (person, place, & time)       Respiratory: No evidence of acute respiratory distress Eyes: PERLA Vitals: There were no vitals taken for this visit.  BMI: Estimated body mass index is 58.24 kg/m as calculated from the following:   Height as of 05/02/22: '5\' 5"'  (1.651 m).   Weight as of 05/02/22: 350 lb (158.8 kg). Ideal: Patient weight not recorded  Assessment   Diagnosis Status  1. Chronic knee  pain (1ry area of Pain) (Left)   2. Primary osteoarthritis of knee (Left)   3. Tricompartment osteoarthritis of knee (Left)   4. BMI 50.0-59.9, adult ([place])    Controlled Controlled Controlled   Updated Problems: No problems updated.   Plan of Care  Problem-specific:  No problem-specific Assessment & Plan notes found for this encounter.  Ms. THI KLICH has a current medication list which includes the following long-term medication(s): albuterol, calcium carbonate, clonazepam, desipramine, lamotrigine, levothyroxine, sucralfate, trazodone, and venlafaxine.  Pharmacotherapy (Medications Ordered): No orders of the defined types were placed in this encounter.  Orders:  No orders of the defined types were placed in this encounter.  Follow-up plan:   No follow-ups on file.     Interventional Therapies  Risk  Complexity Considerations:   Estimated body mass index is 57.91 kg/m as calculated from the following:   Height as of this encounter: '5\' 5"'  (1.651 m).   Weight as of this encounter: 348 lb (157.9 kg). Plaquenil ANTICOAGULATION (Stop: 11 days) WNL   Planned  Pending:      Under consideration:   Diagnostic left genicular NB #2    Completed:   Diagnostic left genicular NB x1 (05/02/2022)  (05/18/2022) Weight management referrals entered.   Completed by other providers:   (10/03/2021) left knee subchondroplasty by Hessie Knows, MD [facility] orthopedics)    Therapeutic  Palliative (PRN) options:   None established    Recent Visits Date Type Provider Dept  05/02/22 Procedure visit Milinda Pointer, MD [facility]  04/19/22 Office Visit Milinda Pointer, MD [facility]  Showing recent visits within past 90 days and meeting all other requirements Future Appointments Date Type Provider Dept  05/18/22 Appointment Milinda Pointer, MD [facility]  Showing future appointments within next 90 days and meeting all other  requirements  I discussed the assessment and treatment plan with the patient. The patient was provided an opportunity to ask questions and all were answered. The patient agreed with the plan and demonstrated an understanding of the instructions.  Patient advised to call back or seek an in-person evaluation if the symptoms or condition worsens.  Duration of encounter: *** minutes.  Total time on encounter, as per AMA guidelines included both the face-to-face and non-face-to-face time personally spent by the physician and/or other qualified health care professional(s) on the day of the encounter (includes time in activities that require the physician or other qualified health care professional and does not include time in activities normally performed by clinical staff). Physician's time may include the following activities when performed: preparing to see the patient (eg, review of tests, pre-charting review of records) obtaining and/or reviewing separately obtained history performing a medically appropriate examination and/or evaluation counseling and educating the patient/family/caregiver ordering medications, tests, or procedures referring and communicating with other health care professionals (when not separately reported) documenting clinical information in the electronic or other health record independently interpreting results (not separately reported) and communicating results to the patient/ family/caregiver care coordination (not separately reported)  Note by: Gaspar Cola, MD Date: 05/18/2022; Time: 7:29 AM

## 2022-05-15 ENCOUNTER — Telehealth (HOSPITAL_COMMUNITY): Payer: Self-pay

## 2022-05-15 NOTE — Telephone Encounter (Signed)
Patient called with concerns about her medication she stated that she use to pick up a 90 day supply but now she is only getting a 30 day supply which is costing her more a 30 day supply cost her $25 90 day supply is no cost please advise Last visit 05/10/22 Next visit 09/13/22  hydrOXYzine (ATARAX) 50 MG tablet 120 tablet 4 05/10/2022    Sig - Route: Take 1 tablet (50 mg total) by mouth 3 (three) times daily as needed for anxiety. - Oral   Sent to pharmacy as: hydrOXYzine (ATARAX) 50 MG tablet   E-Prescribing Status: Receipt confirmed by pharmacy (05/10/2022  2:59 PM EDT)

## 2022-05-16 ENCOUNTER — Other Ambulatory Visit (HOSPITAL_COMMUNITY): Payer: Self-pay | Admitting: Psychiatry

## 2022-05-16 DIAGNOSIS — F411 Generalized anxiety disorder: Secondary | ICD-10-CM

## 2022-05-16 MED ORDER — HYDROXYZINE HCL 50 MG PO TABS: 50.0000 mg | ORAL_TABLET | Freq: Three times a day (TID) | ORAL | 2 refills | Status: DC | PRN

## 2022-05-18 ENCOUNTER — Ambulatory Visit: Payer: Medicare HMO | Attending: Pain Medicine | Admitting: Pain Medicine

## 2022-05-18 ENCOUNTER — Encounter: Payer: Self-pay | Admitting: Pain Medicine

## 2022-05-18 VITALS — BP 156/76 | HR 73 | Temp 97.0°F | Resp 18 | Ht 65.0 in | Wt 350.0 lb

## 2022-05-18 DIAGNOSIS — M25562 Pain in left knee: Secondary | ICD-10-CM | POA: Diagnosis not present

## 2022-05-18 DIAGNOSIS — G8929 Other chronic pain: Secondary | ICD-10-CM | POA: Diagnosis not present

## 2022-05-18 DIAGNOSIS — M1712 Unilateral primary osteoarthritis, left knee: Secondary | ICD-10-CM | POA: Diagnosis not present

## 2022-05-18 DIAGNOSIS — Z6841 Body Mass Index (BMI) 40.0 and over, adult: Secondary | ICD-10-CM | POA: Insufficient documentation

## 2022-05-23 ENCOUNTER — Ambulatory Visit (INDEPENDENT_AMBULATORY_CARE_PROVIDER_SITE_OTHER): Payer: Medicare HMO | Admitting: Physician Assistant

## 2022-05-23 ENCOUNTER — Encounter: Payer: Self-pay | Admitting: Physician Assistant

## 2022-05-23 VITALS — BP 125/75 | HR 71 | Ht 65.0 in | Wt 350.0 lb

## 2022-05-23 DIAGNOSIS — Z87440 Personal history of urinary (tract) infections: Secondary | ICD-10-CM | POA: Diagnosis not present

## 2022-05-23 DIAGNOSIS — N390 Urinary tract infection, site not specified: Secondary | ICD-10-CM

## 2022-05-23 LAB — URINALYSIS, COMPLETE
Bilirubin, UA: NEGATIVE
Glucose, UA: NEGATIVE
Ketones, UA: NEGATIVE
Leukocytes,UA: NEGATIVE
Nitrite, UA: NEGATIVE
RBC, UA: NEGATIVE
Specific Gravity, UA: 1.03 (ref 1.005–1.030)
Urobilinogen, Ur: 0.2 mg/dL (ref 0.2–1.0)
pH, UA: 6 (ref 5.0–7.5)

## 2022-05-23 LAB — MICROSCOPIC EXAMINATION

## 2022-05-25 NOTE — Progress Notes (Signed)
05/23/2022 4:35 PM   Jamie Bennett Feb 01, 1961 [id number]  CC: Chief Complaint  Patient presents with   Recurrent UTI   HPI: Jamie Bennett is a 61 y.o. female with PMH recurrent UTIs on suppressive Macrobid who presents today for 21-monthfollow-up.   Today she reports UTIs since she was last seen in the note by Dr. SBernardo Heateron 01/27/2022.  She remains on daily suppressive Macrobid and is very pleased with her progress on this.  She wishes to continue it.  In-office UA today positive for trace protein; urine microscopy pan negative.  PMH: Past Medical History:  Diagnosis Date   Anemia    Anxiety    Bipolar 1 disorder ([place]    Collagen vascular disease ([place]    rhematoid arthritis   Constipation    Dyspnea    Fibromyalgia    GERD (gastroesophageal reflux disease)    Heart murmur    mild-asymptomatic   Hepatic steatosis    History of kidney stones    History of Roux-en-Y gastric bypass    Hypotension    Hypothyroidism    Morbid obesity with BMI of 50.0-59.9, adult (HCC)    Opioid abuse ([place]    Osteoarthritis    Osteoporosis    PONV (postoperative nausea and vomiting)    nausea only   Rheumatic fever    Rheumatoid arthritis ([place]    Small bowel obstruction ([place]    Thyroid disease     Surgical History: Past Surgical History:  Procedure Laterality Date   CHOLECYSTECTOMY  2004   COLONOSCOPY N/A 07/16/2017   Procedure: COLONOSCOPY;  Surgeon: EManya Silvas MD;  [facility];  Service: Endoscopy;  Laterality: N/A;   EXPLORATORY LAPAROTOMY  2009   [facility]  [facility]  HEEL SPUR EXCISION Bilateral    KNEE ARTHROSCOPY WITH SUBCHONDROPLASTY Left 09/27/2021   Procedure: Left knee subchondroplasty medial tibial plateau;  Surgeon: MHessie Knows MD;  Location: [facility] ORS;  Service: Orthopedics;  Laterality: Left;   LAPAROTOMY N/A 03/01/2018   Procedure: EXPLORATORY LAPAROTOMY/ UMBILECTOMY;  Surgeon: BRobert Bellow MD;   Location: [facility] ORS;  Service: General;  Laterality: N/A;   TONSILLECTOMY     VENTRAL HERNIA REPAIR N/A 03/07/2018   Procedure: HERNIA REPAIR VENTRAL ADULT;  Surgeon: BRobert Bellow MD;  Location: [facility] ORS;  Service: General;  Laterality: N/A;    Home Medications:  Allergies as of 05/23/2022       Reactions   Lactose Intolerance (gi) Other (See Comments)   Bloating and GI distress   Sulfa Antibiotics Hives        Medication List        Accurate as of May 23, 2022 11:59 PM. If you have any questions, ask your nurse or doctor.          acetaminophen 500 MG tablet Commonly known as: TYLENOL Take 500-1,000 mg by mouth every 6 (six) hours as needed (pain.).   albuterol 108 (90 Base) MCG/ACT inhaler Commonly known as: VENTOLIN HFA Inhale 2 puffs into the lungs every 6 (six) hours as needed for wheezing or shortness of breath.   ascorbic acid 500 MG tablet Commonly known as: VITAMIN C Take 500 mg by mouth in the morning.   busPIRone 30 MG tablet Commonly known as: BUSPAR 1 bid   CALCIUM 600 PO Take 600 mg by mouth in the morning and at bedtime.   Cholecalciferol 125 MCG (5000 UT) Tabs Take 5,000 Units by mouth  in the morning.   clonazePAM 0.5 MG tablet Commonly known as: KLONOPIN TAKE 1 TABLET BY MOUTH ONCE DAILY AS NEEDED FOR  ACUTE  ANXIETY. Pt may take 1 extra tablet (0.5 mg) PRN as needed for severe anxiety.   Coenzyme Q10 300 MG Caps Take 300 mg by mouth in the morning.   cyanocobalamin 1000 MCG/ML injection Commonly known as: VITAMIN B12 Inject 1,000 mcg into the muscle every 30 (thirty) days.   denosumab 60 MG/ML Sosy injection Commonly known as: PROLIA Inject 60 mg into the skin every 6 (six) months.   desipramine 25 MG tablet Commonly known as: Norpramin Take 1 tablet (25 mg total) by mouth daily.   esomeprazole 20 MG capsule Commonly known as: NEXIUM Take 20 mg by mouth in the morning.   estradiol 0.1 MG/GM vaginal cream Commonly  known as: ESTRACE Apply a pea-sized amount to index finger and wipe in vaginal introitus twice weekly   hydroxychloroquine 200 MG tablet Commonly known as: PLAQUENIL Take 1 tablet (200 mg total) by mouth 2 (two) times daily.   hydrOXYzine 50 MG tablet Commonly known as: ATARAX Take 1 tablet (50 mg total) by mouth 3 (three) times daily as needed for anxiety.   Krill Oil 500 MG Caps Take 500 mg by mouth daily at 12 noon.   lamoTRIgine 150 MG tablet Commonly known as: LAMICTAL Take 1 tablet (150 mg total) by mouth 2 (two) times daily.   levothyroxine 112 MCG tablet Commonly known as: SYNTHROID Take 1 tablet (112 mcg total) by mouth daily before breakfast.   loratadine 10 MG tablet Commonly known as: CLARITIN Take 10 mg by mouth daily as needed for allergies.   magnesium oxide 400 MG tablet Commonly known as: MAG-OX Take 400 mg by mouth every evening.   multivitamin tablet Take 1 tablet by mouth every evening.   mupirocin ointment 2 % Commonly known as: BACTROBAN Apply 1 Application topically 2 (two) times daily.   nitrofurantoin 50 MG capsule Commonly known as: MACRODANTIN Take 1 capsule (50 mg total) by mouth daily.   nystatin powder Commonly known as: MYCOSTATIN/NYSTOP Apply 1 Application topically 2 (two) times daily.   polyethylene glycol 17 g packet Commonly known as: MIRALAX / GLYCOLAX Take 17 g by mouth in the morning and at bedtime.   psyllium 58.6 % packet Commonly known as: METAMUCIL Take 1 packet by mouth in the morning.   sucralfate 1 g tablet Commonly known as: CARAFATE Take 1 tablet (1 g total) by mouth 2 (two) times daily.   tiZANidine 4 MG tablet Commonly known as: ZANAFLEX Take 1 tablet (4 mg total) by mouth 3 (three) times daily.   traMADol 50 MG tablet Commonly known as: ULTRAM Take 1 tablet (50 mg total) by mouth 2 (two) times daily as needed for pain.   traZODone 100 MG tablet Commonly known as: DESYREL Take 1 tablet (100 mg total)  by mouth at bedtime as needed and may repeat dose one time if needed for sleep.   venlafaxine 100 MG tablet Commonly known as: EFFEXOR Take 1 tablet (100 mg total) by mouth 3 (three) times daily with meals.   zinc gluconate 50 MG tablet Take 50 mg by mouth in the morning.        Allergies:  Allergies  Allergen Reactions   Lactose Intolerance (Gi) Other (See Comments)    Bloating and GI distress   Sulfa Antibiotics Hives    Family History: Family History  Problem Relation Age of Onset  Depression Mother    Dementia Mother    Heart disease Mother    Osteoporosis Mother    Parkinson's disease Father    Liver disease Brother    Colon cancer Neg Hx     Social History:   reports that she has never smoked. She has been exposed to tobacco smoke. She has never used smokeless tobacco. She reports that she does not currently use alcohol. She reports that she does not use drugs.  Physical Exam: BP 125/75   Pulse 71   Ht '5\' 5"'$  (1.651 m)   Wt (!) 350 lb (158.8 kg)   BMI 58.24 kg/m   Constitutional:  Alert and oriented, no acute distress, nontoxic appearing HEENT: [place], AT Cardiovascular: No clubbing, cyanosis, or edema Respiratory: Normal respiratory effort, no increased work of breathing Skin: No rashes, bruises or suspicious lesions Neurologic: Grossly intact, no focal deficits, moving all 4 extremities Psychiatric: Normal mood and affect  Laboratory Data: Results for orders placed or performed in visit on 05/23/22  Microscopic Examination   Urine  Result Value Ref Range   WBC, UA 0-5 0 - 5 /hpf   RBC, Urine 0-2 0 - 2 /hpf   Epithelial Cells (non renal) 0-10 0 - 10 /hpf   Mucus, UA Present (A) Not Estab.   Bacteria, UA Few None seen/Few  Urinalysis, Complete  Result Value Ref Range   Specific Gravity, UA 1.030 1.005 - 1.030   pH, UA 6.0 5.0 - 7.5   Color, UA Yellow Yellow   Appearance Ur Clear Clear   Leukocytes,UA Negative Negative   Protein,UA Trace (A)  Negative/Trace   Glucose, UA Negative Negative   Ketones, UA Negative Negative   RBC, UA Negative Negative   Bilirubin, UA Negative Negative   Urobilinogen, Ur 0.2 0.2 - 1.0 mg/dL   Nitrite, UA Negative Negative   Microscopic Examination See below:    Assessment & Plan:   1. Recurrent UTI Asymptomatic with no breakthrough infections on daily suppressive Macrobid.  She wishes to continue this.  We discussed duration of therapy that we are undecided today.  We will plan to continue this for at least the next 6 months and touch base via telephone at that point.  We discussed options include indefinite therapy versus a 6 to 69-monthtrial of therapy before discontinuing the medication.  We will continue to monitor. - Urinalysis, Complete  Return in about 6 months (around 11/22/2022) for Recurrent UTI follow-up, virtual visit.  SDebroah Loop PA-C  [facility] [address] [place] [phone]

## 2022-05-27 ENCOUNTER — Other Ambulatory Visit: Payer: Self-pay | Admitting: Nurse Practitioner

## 2022-05-27 DIAGNOSIS — Z760 Encounter for issue of repeat prescription: Secondary | ICD-10-CM

## 2022-05-27 DIAGNOSIS — G8929 Other chronic pain: Secondary | ICD-10-CM

## 2022-06-03 ENCOUNTER — Other Ambulatory Visit: Payer: Self-pay | Admitting: Physician Assistant

## 2022-06-03 DIAGNOSIS — K269 Duodenal ulcer, unspecified as acute or chronic, without hemorrhage or perforation: Secondary | ICD-10-CM

## 2022-06-06 ENCOUNTER — Other Ambulatory Visit: Payer: Self-pay

## 2022-06-06 DIAGNOSIS — K269 Duodenal ulcer, unspecified as acute or chronic, without hemorrhage or perforation: Secondary | ICD-10-CM

## 2022-06-06 MED ORDER — SUCRALFATE 1 G PO TABS: 1.0000 g | ORAL_TABLET | Freq: Two times a day (BID) | ORAL | 1 refills | Status: DC

## 2022-06-09 ENCOUNTER — Ambulatory Visit (INDEPENDENT_AMBULATORY_CARE_PROVIDER_SITE_OTHER): Payer: Medicare HMO | Admitting: Physician Assistant

## 2022-06-09 ENCOUNTER — Encounter: Payer: Self-pay | Admitting: Physician Assistant

## 2022-06-09 VITALS — BP 138/82 | HR 70 | Temp 98.4°F | Resp 16 | Ht 65.0 in | Wt 345.4 lb

## 2022-06-09 DIAGNOSIS — Z23 Encounter for immunization: Secondary | ICD-10-CM | POA: Diagnosis not present

## 2022-06-09 DIAGNOSIS — E782 Mixed hyperlipidemia: Secondary | ICD-10-CM | POA: Diagnosis not present

## 2022-06-09 DIAGNOSIS — M25562 Pain in left knee: Secondary | ICD-10-CM | POA: Diagnosis not present

## 2022-06-09 DIAGNOSIS — G8929 Other chronic pain: Secondary | ICD-10-CM

## 2022-06-09 DIAGNOSIS — R300 Dysuria: Secondary | ICD-10-CM | POA: Diagnosis not present

## 2022-06-09 DIAGNOSIS — E039 Hypothyroidism, unspecified: Secondary | ICD-10-CM

## 2022-06-09 DIAGNOSIS — Z760 Encounter for issue of repeat prescription: Secondary | ICD-10-CM

## 2022-06-09 DIAGNOSIS — Z0001 Encounter for general adult medical examination with abnormal findings: Secondary | ICD-10-CM | POA: Diagnosis not present

## 2022-06-09 DIAGNOSIS — K269 Duodenal ulcer, unspecified as acute or chronic, without hemorrhage or perforation: Secondary | ICD-10-CM | POA: Diagnosis not present

## 2022-06-09 MED ORDER — SUCRALFATE 1 G PO TABS: 1.0000 g | ORAL_TABLET | Freq: Two times a day (BID) | ORAL | 1 refills | Status: DC

## 2022-06-09 MED ORDER — TIZANIDINE HCL 4 MG PO TABS: 4.0000 mg | ORAL_TABLET | Freq: Three times a day (TID) | ORAL | 0 refills | Status: DC

## 2022-06-09 MED ORDER — LEVOTHYROXINE SODIUM 112 MCG PO TABS: 112.0000 ug | ORAL_TABLET | Freq: Every day | ORAL | 1 refills | Status: DC

## 2022-06-09 NOTE — Progress Notes (Signed)
[facility] [place], [place] [address]  Internal MEDICINE  Office Visit Note  Patient Name: Jamie Bennett  [id number]  [id number]  Date of Service: 06/20/2022  Chief Complaint  Patient presents with   Medicare Wellness   Gastroesophageal Reflux   Medication Management    Patient has concerns about medications     HPI Pt is here for routine health maintenance examination -Has gotten shingles vaccines and will call with dates -Was having small tremors and was started on gabapentin but then was havign more jerking movement and stopped this -Seeing pain management for knee pain now. This has been painful since she had a fall over a year ago. Had a nerve block which has helped.  -intermittent constipation, told she has a tortuous colon previously. Taking metamucil and miralax -Does get prolia shots through GI doctor and is going to discuss constipation as well -Some low back pain and did stretch it out and since then more bothersome and tender to the touch. Using heating pad.  Current Medication: Outpatient Encounter Medications as of 06/09/2022  Medication Sig Note   acetaminophen (TYLENOL) 500 MG tablet Take 500-1,000 mg by mouth every 6 (six) hours as needed (pain.).    albuterol (VENTOLIN HFA) 108 (90 Base) MCG/ACT inhaler Inhale 2 puffs into the lungs every 6 (six) hours as needed for wheezing or shortness of breath.    busPIRone (BUSPAR) 30 MG tablet 1 bid    Calcium Carbonate (CALCIUM 600 PO) Take 600 mg by mouth in the morning and at bedtime.    Cholecalciferol 125 MCG (5000 UT) TABS Take 5,000 Units by mouth in the morning.    clonazePAM (KLONOPIN) 0.5 MG tablet TAKE 1 TABLET BY MOUTH ONCE DAILY AS NEEDED FOR  ACUTE  ANXIETY. Pt may take 1 extra tablet (0.5 mg) PRN as needed for severe anxiety.    Coenzyme Q10 300 MG CAPS Take 300 mg by mouth in the morning.    cyanocobalamin (,VITAMIN B-12,) 1000 MCG/ML injection Inject 1,000 mcg into the  muscle every 30 (thirty) days.     denosumab (PROLIA) 60 MG/ML SOSY injection Inject 60 mg into the skin every 6 (six) months. 09/01/2021: Last dose: 07/2021   desipramine (NORPRAMIN) 25 MG tablet Take 1 tablet (25 mg total) by mouth daily.    esomeprazole (NEXIUM) 20 MG capsule Take 20 mg by mouth in the morning.    estradiol (ESTRACE) 0.1 MG/GM vaginal cream Apply a pea-sized amount to index finger and wipe in vaginal introitus twice weekly    hydroxychloroquine (PLAQUENIL) 200 MG tablet Take 1 tablet (200 mg total) by mouth 2 (two) times daily.    hydrOXYzine (ATARAX) 50 MG tablet Take 1 tablet (50 mg total) by mouth 3 (three) times daily as needed for anxiety.    Krill Oil 500 MG CAPS Take 500 mg by mouth daily at 12 noon.    lamoTRIgine (LAMICTAL) 150 MG tablet Take 1 tablet (150 mg total) by mouth 2 (two) times daily.    loratadine (CLARITIN) 10 MG tablet Take 10 mg by mouth daily as needed for allergies.    magnesium oxide (MAG-OX) 400 MG tablet Take 400 mg by mouth every evening.    Multiple Vitamin (MULTIVITAMIN) tablet Take 1 tablet by mouth every evening.    mupirocin ointment (BACTROBAN) 2 % Apply 1 Application topically 2 (two) times daily.    nitrofurantoin (MACRODANTIN) 50 MG capsule Take 1 capsule (50 mg total) by mouth daily.  nystatin (MYCOSTATIN/NYSTOP) powder Apply 1 Application topically 2 (two) times daily.    polyethylene glycol (MIRALAX / GLYCOLAX) 17 g packet Take 17 g by mouth in the morning and at bedtime.    psyllium (METAMUCIL) 58.6 % packet Take 1 packet by mouth in the morning.    traZODone (DESYREL) 100 MG tablet Take 1 tablet (100 mg total) by mouth at bedtime as needed and may repeat dose one time if needed for sleep.    venlafaxine (EFFEXOR) 100 MG tablet Take 1 tablet (100 mg total) by mouth 3 (three) times daily with meals.    vitamin C (ASCORBIC ACID) 500 MG tablet Take 500 mg by mouth in the morning.    zinc gluconate 50 MG tablet Take 50 mg by mouth in the  morning.    [DISCONTINUED] levothyroxine (SYNTHROID) 112 MCG tablet Take 1 tablet (112 mcg total) by mouth daily before breakfast.    [DISCONTINUED] sucralfate (CARAFATE) 1 g tablet Take 1 tablet (1 g total) by mouth 2 (two) times daily.    [DISCONTINUED] tiZANidine (ZANAFLEX) 4 MG tablet TAKE 1 TABLET BY MOUTH THREE TIMES DAILY    [DISCONTINUED] traMADol (ULTRAM) 50 MG tablet Take 1 tablet (50 mg total) by mouth 2 (two) times daily as needed for pain.    levothyroxine (SYNTHROID) 112 MCG tablet Take 1 tablet (112 mcg total) by mouth daily before breakfast.    sucralfate (CARAFATE) 1 g tablet Take 1 tablet (1 g total) by mouth 2 (two) times daily.    tiZANidine (ZANAFLEX) 4 MG tablet Take 1 tablet (4 mg total) by mouth 3 (three) times daily.    No facility-administered encounter medications on file as of 06/09/2022.    Surgical History: Past Surgical History:  Procedure Laterality Date   CHOLECYSTECTOMY  2004   COLONOSCOPY N/A 07/16/2017   Procedure: COLONOSCOPY;  Surgeon: Manya Silvas, MD;  [facility];  Service: Endoscopy;  Laterality: N/A;   EXPLORATORY LAPAROTOMY  2009   GASTRIC BYPASS  2003   [facility]   HEEL SPUR EXCISION Bilateral    KNEE ARTHROSCOPY WITH SUBCHONDROPLASTY Left 09/27/2021   Procedure: Left knee subchondroplasty medial tibial plateau;  Surgeon: Hessie Knows, MD;  Location: [facility] ORS;  Service: Orthopedics;  Laterality: Left;   LAPAROTOMY N/A 03/01/2018   Procedure: EXPLORATORY LAPAROTOMY/ UMBILECTOMY;  Surgeon: Robert Bellow, MD;  Location: [facility] ORS;  Service: General;  Laterality: N/A;   TONSILLECTOMY     VENTRAL HERNIA REPAIR N/A 03/07/2018   Procedure: HERNIA REPAIR VENTRAL ADULT;  Surgeon: Robert Bellow, MD;  Location: [facility] ORS;  Service: General;  Laterality: N/A;    Medical History: Past Medical History:  Diagnosis Date   Anemia    Anxiety    Bipolar 1 disorder ([place])    Collagen vascular disease ([place])    rhematoid  arthritis   Constipation    Dyspnea    Fibromyalgia    GERD (gastroesophageal reflux disease)    Heart murmur    mild-asymptomatic   Hepatic steatosis    History of kidney stones    History of Roux-en-Y gastric bypass    Hypotension    Hypothyroidism    Morbid obesity with BMI of 50.0-59.9, adult (HCC)    Opioid abuse (HCC)    Osteoarthritis    Osteoporosis    PONV (postoperative nausea and vomiting)    nausea only   Rheumatic fever    Rheumatoid arthritis ([place])    Small bowel obstruction ([place])    Thyroid  disease     Family History: Family History  Problem Relation Age of Onset   Depression Mother    Dementia Mother    Heart disease Mother    Osteoporosis Mother    Parkinson's disease Father    Liver disease Brother    Colon cancer Neg Hx       Review of Systems  Constitutional:  Negative for chills, fatigue and unexpected weight change.  HENT:  Negative for congestion, rhinorrhea, sneezing and sore throat.   Eyes:  Negative for redness.  Respiratory:  Negative for cough, chest tightness and shortness of breath.   Cardiovascular:  Negative for chest pain and palpitations.  Gastrointestinal:  Positive for constipation. Negative for abdominal pain, diarrhea, nausea and vomiting.  Genitourinary:  Negative for dysuria and frequency.  Musculoskeletal:  Positive for arthralgias and back pain. Negative for joint swelling and neck pain.  Skin:  Negative for rash.  Neurological: Negative.  Negative for tremors and numbness.  Hematological:  Negative for adenopathy. Does not bruise/bleed easily.  Psychiatric/Behavioral:  Negative for behavioral problems (Depression), sleep disturbance and suicidal ideas. The patient is not nervous/anxious.      Vital Signs: BP 138/82 Comment: 154/78  Pulse 70   Temp 98.4 F (36.9 C)   Resp 16   Ht _0  (1.651 m)   Wt (!) 345 lb 6.4 oz (156.7 kg)   SpO2 99%   BMI 57.48 kg/m    Physical Exam Vitals reviewed.  Constitutional:       General: She is not in acute distress.    Appearance: Normal appearance. She is obese. She is not ill-appearing.  HENT:     Head: Normocephalic and atraumatic.  Eyes:     Extraocular Movements: Extraocular movements intact.     Pupils: Pupils are equal, round, and reactive to light.  Cardiovascular:     Rate and Rhythm: Normal rate and regular rhythm.  Pulmonary:     Effort: Pulmonary effort is normal. No respiratory distress.  Abdominal:     General: Abdomen is flat.     Tenderness: There is no abdominal tenderness.  Musculoskeletal:     Cervical back: Normal range of motion.  Skin:    General: Skin is warm and dry.  Neurological:     Mental Status: She is alert and oriented to person, place, and time.     Cranial Nerves: No cranial nerve deficit.     Coordination: Coordination normal.     Gait: Gait normal.  Psychiatric:        Mood and Affect: Mood normal.        Behavior: Behavior normal.      LABS: Recent Results (from the past 2160 hour(s))  CBC with Differential/Platelet     Status: None   Collection Time: 05/10/22  2:09 PM  Result Value Ref Range   WBC 5.2 3.8 - 10.8 Thousand/uL   RBC 4.36 3.80 - 5.10 Million/uL   Hemoglobin 12.9 11.7 - 15.5 g/dL   HCT 38.5 35.0 - 45.0 %   MCV 88.3 80.0 - 100.0 fL   MCH 29.6 27.0 - 33.0 pg   MCHC 33.5 32.0 - 36.0 g/dL   RDW 12.3 11.0 - 15.0 %   Platelets 226 140 - 400 Thousand/uL   MPV 9.4 7.5 - 12.5 fL   Neutro Abs 2,652 1,500 - 7,800 cells/uL   Lymphs Abs 1,893 850 - 3,900 cells/uL   Absolute Monocytes 442 200 - 950 cells/uL   Eosinophils Absolute 192  15 - 500 cells/uL   Basophils Absolute 21 0 - 200 cells/uL   Neutrophils Relative % 51 %   Total Lymphocyte 36.4 %   Monocytes Relative 8.5 %   Eosinophils Relative 3.7 %   Basophils Relative 0.4 %  COMPLETE METABOLIC PANEL WITH GFR     Status: None   Collection Time: 05/10/22  2:09 PM  Result Value Ref Range   Glucose, Bld 85 65 - 99 mg/dL    Comment: .             Fasting reference interval .    BUN 20 7 - 25 mg/dL   Creat 0.75 0.50 - 1.05 mg/dL   eGFR 91 > OR = 60 mL/min/1.44m   BUN/Creatinine Ratio SEE NOTE: 6 - 22 (calc)    Comment:    Not Reported: BUN and Creatinine are within    reference range. .    Sodium 142 135 - 146 mmol/L   Potassium 3.7 3.5 - 5.3 mmol/L   Chloride 109 98 - 110 mmol/L   CO2 21 20 - 32 mmol/L   Calcium 9.4 8.6 - 10.4 mg/dL   Total Protein 6.7 6.1 - 8.1 g/dL   Albumin 4.2 3.6 - 5.1 g/dL   Globulin 2.5 1.9 - 3.7 g/dL (calc)   AG Ratio 1.7 1.0 - 2.5 (calc)   Total Bilirubin 0.3 0.2 - 1.2 mg/dL   Alkaline phosphatase (APISO) 92 37 - 153 U/L   AST 13 10 - 35 U/L   ALT 9 6 - 29 U/L  Urinalysis, Complete     Status: Abnormal   Collection Time: 05/23/22 10:36 AM  Result Value Ref Range   Specific Gravity, UA 1.030 1.005 - 1.030   pH, UA 6.0 5.0 - 7.5   Color, UA Yellow Yellow   Appearance Ur Clear Clear   Leukocytes,UA Negative Negative   Protein,UA Trace (A) Negative/Trace   Glucose, UA Negative Negative   Ketones, UA Negative Negative   RBC, UA Negative Negative   Bilirubin, UA Negative Negative   Urobilinogen, Ur 0.2 0.2 - 1.0 mg/dL   Nitrite, UA Negative Negative   Microscopic Examination See below:   Microscopic Examination     Status: Abnormal   Collection Time: 05/23/22 10:36 AM   Urine  Result Value Ref Range   WBC, UA 0-5 0 - 5 /hpf   RBC, Urine 0-2 0 - 2 /hpf   Epithelial Cells (non renal) 0-10 0 - 10 /hpf   Mucus, UA Present (A) Not Estab.   Bacteria, UA Few None seen/Few  UA/M w/rflx Culture, Routine     Status: Abnormal   Collection Time: 06/09/22  2:36 PM   Specimen: Urine   Urine  Result Value Ref Range   Specific Gravity, UA 1.025 1.005 - 1.030   pH, UA 6.0 5.0 - 7.5   Color, UA Yellow Yellow   Appearance Ur Clear Clear   Leukocytes,UA Negative Negative   Protein,UA Negative Negative/Trace   Glucose, UA Negative Negative   Ketones, UA Trace (A) Negative   RBC, UA Negative  Negative   Bilirubin, UA Negative Negative   Urobilinogen, Ur 0.2 0.2 - 1.0 mg/dL   Nitrite, UA Negative Negative   Microscopic Examination Comment     Comment: Microscopic follows if indicated.   Microscopic Examination See below:     Comment: Microscopic was indicated and was performed.   Urinalysis Reflex Comment     Comment: This specimen will not reflex to a  Urine Culture.  Microscopic Examination     Status: Abnormal   Collection Time: 06/09/22  2:36 PM   Urine  Result Value Ref Range   WBC, UA 0-5 0 - 5 /hpf   RBC, Urine 0-2 0 - 2 /hpf   Epithelial Cells (non renal) 0-10 0 - 10 /hpf   Casts None seen None seen /lpf   Crystals Present (A) N/A   Crystal Type Calcium Oxalate N/A   Bacteria, UA None seen None seen/Few        Assessment/Plan: 1. Encounter for general adult medical examination with abnormal findings CPE performed, UTD on PHM   2. Acquired hypothyroidism Will update labs and adjust dose as indicated - levothyroxine (SYNTHROID) 112 MCG tablet; Take 1 tablet (112 mcg total) by mouth daily before breakfast.  Dispense: 90 tablet; Refill: 1 - TSH + free T4  3. Chronic pain of left knee - tiZANidine (ZANAFLEX) 4 MG tablet; Take 1 tablet (4 mg total) by mouth 3 (three) times daily.  Dispense: 270 tablet; Refill: 0  4. Duodenal ulcer disease Will follow up with GI - sucralfate (CARAFATE) 1 g tablet; Take 1 tablet (1 g total) by mouth 2 (two) times daily.  Dispense: 180 tablet; Refill: 1  5. Mixed hyperlipidemia - Lipid Panel With LDL/HDL Ratio  6. Flu vaccine need - Flu Vaccine MDCK QUAD PF  7. Dysuria - UA/M w/rflx Culture, Routine   General Counseling: Almeta verbalizes understanding of the findings of todays visit and agrees with plan of treatment. I have discussed any further diagnostic evaluation that may be needed or ordered today. We also reviewed her medications today. she has been encouraged to call the office with any questions or concerns that  should arise related to todays visit.    Counseling:    Orders Placed This Encounter  Procedures   Microscopic Examination   Flu Vaccine MDCK QUAD PF   UA/M w/rflx Culture, Routine   TSH + free T4   Lipid Panel With LDL/HDL Ratio    Meds ordered this encounter  Medications   levothyroxine (SYNTHROID) 112 MCG tablet    Sig: Take 1 tablet (112 mcg total) by mouth daily before breakfast.    Dispense:  90 tablet    Refill:  1   tiZANidine (ZANAFLEX) 4 MG tablet    Sig: Take 1 tablet (4 mg total) by mouth 3 (three) times daily.    Dispense:  270 tablet    Refill:  0   sucralfate (CARAFATE) 1 g tablet    Sig: Take 1 tablet (1 g total) by mouth 2 (two) times daily.    Dispense:  180 tablet    Refill:  1    This patient was seen by Drema Dallas, PA-C in collaboration with Dr. Clayborn Bigness as a part of collaborative care agreement.  Total time spent:35 Minutes  Time spent includes review of chart, medications, test results, and follow up plan with the patient.     Lavera Guise, MD  Internal Medicine

## 2022-06-10 LAB — UA/M W/RFLX CULTURE, ROUTINE
Bilirubin, UA: NEGATIVE
Glucose, UA: NEGATIVE
Leukocytes,UA: NEGATIVE
Nitrite, UA: NEGATIVE
Protein,UA: NEGATIVE
RBC, UA: NEGATIVE
Specific Gravity, UA: 1.025 (ref 1.005–1.030)
Urobilinogen, Ur: 0.2 mg/dL (ref 0.2–1.0)
pH, UA: 6 (ref 5.0–7.5)

## 2022-06-10 LAB — MICROSCOPIC EXAMINATION
Bacteria, UA: NONE SEEN
Casts: NONE SEEN /lpf

## 2022-06-12 ENCOUNTER — Ambulatory Visit: Payer: Medicare HMO | Admitting: Urology

## 2022-06-13 ENCOUNTER — Inpatient Hospital Stay: Payer: Medicare HMO

## 2022-06-18 ENCOUNTER — Telehealth: Payer: Self-pay | Admitting: Physician Assistant

## 2022-06-18 ENCOUNTER — Other Ambulatory Visit: Payer: Self-pay | Admitting: Nurse Practitioner

## 2022-06-18 MED ORDER — MOLNUPIRAVIR EUA 200MG CAPSULE: 4.0000 | ORAL_CAPSULE | Freq: Two times a day (BID) | ORAL | 0 refills | Status: AC

## 2022-06-18 NOTE — Telephone Encounter (Signed)
Patient lvm @ 3:20 pm, on after-hour line stating she had tested positive for covid this morning. Requesting antiviral medication to be sent to [facility]. Symptoms started yesterday. Texted AA. Notified patient that medication had been sent to pharmacy.

## 2022-06-26 ENCOUNTER — Other Ambulatory Visit: Payer: Self-pay

## 2022-06-26 ENCOUNTER — Telehealth: Payer: Self-pay

## 2022-06-26 MED ORDER — PREDNISONE 10 MG PO TABS: ORAL_TABLET | ORAL | 0 refills | Status: DC

## 2022-06-26 MED ORDER — AZITHROMYCIN 250 MG PO TABS: ORAL_TABLET | ORAL | 0 refills | Status: DC

## 2022-06-26 NOTE — Telephone Encounter (Signed)
Pt called that she had Covid positive 8 days ago we send her antiviral med but she still coughing as per dr Humphrey Rolls advised that we send zpak and prednisone and advised her if she is not feeling better need appt

## 2022-06-30 ENCOUNTER — Telehealth: Payer: Self-pay

## 2022-06-30 NOTE — Telephone Encounter (Signed)
Pt called that she still coughing advised her to finished antibiotic and prednisone and also drink plenty of water and also advised her that we will make her appt Monday if she is feeling better keep appt

## 2022-07-03 ENCOUNTER — Ambulatory Visit: Payer: Medicare HMO | Admitting: Physician Assistant

## 2022-07-07 ENCOUNTER — Ambulatory Visit
Admission: RE | Admit: 2022-07-07 | Discharge: 2022-07-07 | Disposition: A | Discharge status: Home / Self Care | Payer: Medicare HMO | Attending: Physician Assistant | Admitting: Physician Assistant

## 2022-07-07 ENCOUNTER — Ambulatory Visit
Admission: RE | Admit: 2022-07-07 | Discharge: 2022-07-07 | Disposition: A | Discharge status: Home / Self Care | Payer: Medicare HMO | Source: Ambulatory Visit | Attending: Physician Assistant | Admitting: Physician Assistant

## 2022-07-07 ENCOUNTER — Telehealth (INDEPENDENT_AMBULATORY_CARE_PROVIDER_SITE_OTHER): Payer: Medicare HMO | Admitting: Physician Assistant

## 2022-07-07 VITALS — Ht 65.0 in | Wt 342.0 lb

## 2022-07-07 DIAGNOSIS — U099 Post covid-19 condition, unspecified: Secondary | ICD-10-CM | POA: Diagnosis not present

## 2022-07-07 DIAGNOSIS — R053 Chronic cough: Secondary | ICD-10-CM

## 2022-07-07 DIAGNOSIS — R062 Wheezing: Secondary | ICD-10-CM | POA: Insufficient documentation

## 2022-07-07 DIAGNOSIS — R059 Cough, unspecified: Secondary | ICD-10-CM | POA: Diagnosis not present

## 2022-07-07 NOTE — Progress Notes (Signed)
[facility] [place], [place] [address]  Internal MEDICINE  Telephone Visit  Patient Name: Jamie Bennett  [id number]  [id number]  Date of Service: 07/15/2022  I connected with the patient at 8:34 by telephone and verified the patients identity using two identifiers.   I discussed the limitations, risks, security and privacy concerns of performing an evaluation and management service by telephone and the availability of in person appointments. I also discussed with the patient that there may be a patient responsible charge related to the service.  The patient expressed understanding and agrees to proceed.    Chief Complaint  Patient presents with   Telephone Assessment    [id number]   Telephone Screen   Cough   Wheezing    HPI Pt is here for virtual sick visit -She tested positive for covid on 11/19 and was treated with paxlovid -On 11/27 she called stating she was still having symptoms and was treated with zpak and prednisone -Retested last night and was negative -Still having dry cough, wheezing which is new. Has tried albuterol and mucinexDM and dayquil and not helping much -Reports left ear a little sore, weakness and fatigue. No fevers or chills. SOB on exertion only. Does have pulse ox at home and oxygen 98%. -does have prednisone refill available and may pick this up -Will go ahead and order chest xray to ensure no PNA -Did discuss cough and fatigue can last for awhile after covid and take time  Current Medication: Outpatient Encounter Medications as of 07/07/2022  Medication Sig Note   acetaminophen (TYLENOL) 500 MG tablet Take 500-1,000 mg by mouth every 6 (six) hours as needed (pain.).    albuterol (VENTOLIN HFA) 108 (90 Base) MCG/ACT inhaler Inhale 2 puffs into the lungs every 6 (six) hours as needed for wheezing or shortness of breath.    busPIRone (BUSPAR) 30 MG tablet 1 bid    Calcium Carbonate (CALCIUM 600 PO) Take 600 mg by mouth in  the morning and at bedtime.    Cholecalciferol 125 MCG (5000 UT) TABS Take 5,000 Units by mouth in the morning.    clonazePAM (KLONOPIN) 0.5 MG tablet TAKE 1 TABLET BY MOUTH ONCE DAILY AS NEEDED FOR  ACUTE  ANXIETY. Pt may take 1 extra tablet (0.5 mg) PRN as needed for severe anxiety.    Coenzyme Q10 300 MG CAPS Take 300 mg by mouth in the morning.    cyanocobalamin (,VITAMIN B-12,) 1000 MCG/ML injection Inject 1,000 mcg into the muscle every 30 (thirty) days.     denosumab (PROLIA) 60 MG/ML SOSY injection Inject 60 mg into the skin every 6 (six) months. 09/01/2021: Last dose: 07/2021   desipramine (NORPRAMIN) 25 MG tablet Take 1 tablet (25 mg total) by mouth daily.    esomeprazole (NEXIUM) 20 MG capsule Take 20 mg by mouth in the morning.    estradiol (ESTRACE) 0.1 MG/GM vaginal cream Apply a pea-sized amount to index finger and wipe in vaginal introitus twice weekly    hydroxychloroquine (PLAQUENIL) 200 MG tablet Take 1 tablet (200 mg total) by mouth 2 (two) times daily.    hydrOXYzine (ATARAX) 50 MG tablet Take 1 tablet (50 mg total) by mouth 3 (three) times daily as needed for anxiety.    Krill Oil 500 MG CAPS Take 500 mg by mouth daily at 12 noon.    lamoTRIgine (LAMICTAL) 150 MG tablet Take 1 tablet (150 mg total) by mouth 2 (two) times daily.    levothyroxine (SYNTHROID)  112 MCG tablet Take 1 tablet (112 mcg total) by mouth daily before breakfast.    loratadine (CLARITIN) 10 MG tablet Take 10 mg by mouth daily as needed for allergies.    magnesium oxide (MAG-OX) 400 MG tablet Take 400 mg by mouth every evening.    Multiple Vitamin (MULTIVITAMIN) tablet Take 1 tablet by mouth every evening.    mupirocin ointment (BACTROBAN) 2 % Apply 1 Application topically 2 (two) times daily.    nitrofurantoin (MACRODANTIN) 50 MG capsule Take 1 capsule (50 mg total) by mouth daily.    nystatin (MYCOSTATIN/NYSTOP) powder Apply 1 Application topically 2 (two) times daily.    polyethylene glycol (MIRALAX /  GLYCOLAX) 17 g packet Take 17 g by mouth in the morning and at bedtime.    psyllium (METAMUCIL) 58.6 % packet Take 1 packet by mouth in the morning.    sucralfate (CARAFATE) 1 g tablet Take 1 tablet (1 g total) by mouth 2 (two) times daily.    tiZANidine (ZANAFLEX) 4 MG tablet Take 1 tablet (4 mg total) by mouth 3 (three) times daily.    traZODone (DESYREL) 100 MG tablet Take 1 tablet (100 mg total) by mouth at bedtime as needed and may repeat dose one time if needed for sleep.    venlafaxine (EFFEXOR) 100 MG tablet Take 1 tablet (100 mg total) by mouth 3 (three) times daily with meals.    vitamin C (ASCORBIC ACID) 500 MG tablet Take 500 mg by mouth in the morning.    zinc gluconate 50 MG tablet Take 50 mg by mouth in the morning.    [DISCONTINUED] azithromycin (ZITHROMAX) 250 MG tablet Use as directed for 5 days    [DISCONTINUED] predniSONE (DELTASONE) 10 MG tablet Take 1 tab po 3 x day for 3 days then take 1 tab po 2 x a day for 3 days and then take 1 tab po daily for 3 days    No facility-administered encounter medications on file as of 07/07/2022.    Surgical History: Past Surgical History:  Procedure Laterality Date   CHOLECYSTECTOMY  2004   COLONOSCOPY N/A 07/16/2017   Procedure: COLONOSCOPY;  Surgeon: Manya Silvas, MD;  Location: [facility] ENDOSCOPY;  Service: Endoscopy;  Laterality: N/A;   EXPLORATORY LAPAROTOMY  2009   GASTRIC BYPASS  2003   [facility]   HEEL SPUR EXCISION Bilateral    KNEE ARTHROSCOPY WITH SUBCHONDROPLASTY Left 09/27/2021   Procedure: Left knee subchondroplasty medial tibial plateau;  Surgeon: Hessie Knows, MD;  Location: [facility] ORS;  Service: Orthopedics;  Laterality: Left;   LAPAROTOMY N/A 03/01/2018   Procedure: EXPLORATORY LAPAROTOMY/ UMBILECTOMY;  Surgeon: Robert Bellow, MD;  Location: [facility] ORS;  Service: General;  Laterality: N/A;   TONSILLECTOMY     VENTRAL HERNIA REPAIR N/A 03/07/2018   Procedure: HERNIA REPAIR VENTRAL ADULT;  Surgeon: Robert Bellow, MD;  Location: [facility] ORS;  Service: General;  Laterality: N/A;    Medical History: Past Medical History:  Diagnosis Date   Anemia    Anxiety    Bipolar 1 disorder ([place])    Collagen vascular disease ([place])    rhematoid arthritis   Constipation    Dyspnea    Fibromyalgia    GERD (gastroesophageal reflux disease)    Heart murmur    mild-asymptomatic   Hepatic steatosis    History of kidney stones    History of Roux-en-Y gastric bypass    Hypotension    Hypothyroidism    Morbid obesity with BMI  of 50.0-59.9, adult ([place])    Opioid abuse ([place])    Osteoarthritis    Osteoporosis    PONV (postoperative nausea and vomiting)    nausea only   Rheumatic fever    Rheumatoid arthritis ([place])    Small bowel obstruction ([facility])    Thyroid disease     Family History: Family History  Problem Relation Age of Onset   Depression Mother    Dementia Mother    Heart disease Mother    Osteoporosis Mother    Parkinson's disease Father    Liver disease Brother    Colon cancer Neg Hx     Social History   Socioeconomic History   Marital status: Married    Spouse name: Not on file   Number of children: Not on file   Years of education: Not on file   Highest education level: Not on file  Occupational History   Not on file  Tobacco Use   Smoking status: Never    Passive exposure: Past   Smokeless tobacco: Never  Vaping Use   Vaping Use: Never used  Substance and Sexual Activity   Alcohol use: Not Currently   Drug use: No   Sexual activity: Yes  Other Topics Concern   Not on file  Social History Narrative   Not on file   Social Determinants of Health   Financial Resource Strain: Not on file  Food Insecurity: Not on file  Transportation Needs: Not on file  Physical Activity: Not on file  Stress: Not on file  Social Connections: Not on file  Intimate Partner Violence: Not on file      Review of Systems  Constitutional:  Positive for fatigue. Negative for fever.   HENT:  Positive for postnasal drip. Negative for congestion and mouth sores.   Respiratory:  Positive for cough and wheezing.   Cardiovascular:  Negative for chest pain.  Genitourinary:  Negative for flank pain.  Psychiatric/Behavioral: Negative.      Vital Signs: Ht '5\' 5"'$  (1.651 m)   Wt (!) 342 lb (155.1 kg)   BMI 56.91 kg/m    Observation/Objective:  Pt is able to carry out conversation   Assessment/Plan: 1. Post-COVID chronic cough May take additional rround of prednisone and continue OTC cough remedies as needed, Will check CXR - DG Chest 2 View; Future  2. Wheezing May continue albuterol as needed - DG Chest 2 View; Future   General Counseling: alysandra lobue understanding of the findings of today's phone visit and agrees with plan of treatment. I have discussed any further diagnostic evaluation that may be needed or ordered today. We also reviewed her medications today. she has been encouraged to call the office with any questions or concerns that should arise related to todays visit.    Orders Placed This Encounter  Procedures   DG Chest 2 View    No orders of the defined types were placed in this encounter.   Time spent:25 Minutes    Dr Lavera Guise Internal medicine

## 2022-07-10 ENCOUNTER — Telehealth: Payer: Self-pay

## 2022-07-10 NOTE — Telephone Encounter (Signed)
-----   Message from Mylinda Latina, PA-C sent at 07/10/2022  4:14 PM EST ----- Please let her know that her chest xray did not show any acute abnormalities

## 2022-07-10 NOTE — Telephone Encounter (Signed)
Spoke with patient regarding normal chest x-ray results.

## 2022-07-12 ENCOUNTER — Inpatient Hospital Stay: Payer: Medicare HMO | Attending: Oncology

## 2022-07-12 ENCOUNTER — Inpatient Hospital Stay (HOSPITAL_BASED_OUTPATIENT_CLINIC_OR_DEPARTMENT_OTHER): Payer: Medicare HMO | Admitting: Oncology

## 2022-07-12 ENCOUNTER — Inpatient Hospital Stay: Payer: Medicare HMO

## 2022-07-12 ENCOUNTER — Encounter: Payer: Self-pay | Admitting: Oncology

## 2022-07-12 VITALS — BP 167/85 | HR 76 | Temp 97.6°F | Ht 65.0 in | Wt 347.0 lb

## 2022-07-12 DIAGNOSIS — D519 Vitamin B12 deficiency anemia, unspecified: Secondary | ICD-10-CM | POA: Diagnosis not present

## 2022-07-12 DIAGNOSIS — Z9884 Bariatric surgery status: Secondary | ICD-10-CM

## 2022-07-12 DIAGNOSIS — D509 Iron deficiency anemia, unspecified: Secondary | ICD-10-CM

## 2022-07-12 DIAGNOSIS — E538 Deficiency of other specified B group vitamins: Secondary | ICD-10-CM | POA: Diagnosis not present

## 2022-07-12 LAB — CBC WITH DIFFERENTIAL/PLATELET
Abs Immature Granulocytes: 0.02 10*3/uL (ref 0.00–0.07)
Basophils Absolute: 0 10*3/uL (ref 0.0–0.1)
Basophils Relative: 1 %
Eosinophils Absolute: 0.2 10*3/uL (ref 0.0–0.5)
Eosinophils Relative: 2 %
HCT: 38.7 % (ref 36.0–46.0)
Hemoglobin: 12.6 g/dL (ref 12.0–15.0)
Immature Granulocytes: 0 %
Lymphocytes Relative: 28 %
Lymphs Abs: 2.1 10*3/uL (ref 0.7–4.0)
MCH: 29.2 pg (ref 26.0–34.0)
MCHC: 32.6 g/dL (ref 30.0–36.0)
MCV: 89.6 fL (ref 80.0–100.0)
Monocytes Absolute: 0.8 10*3/uL (ref 0.1–1.0)
Monocytes Relative: 10 %
Neutro Abs: 4.4 10*3/uL (ref 1.7–7.7)
Neutrophils Relative %: 59 %
Platelets: 214 10*3/uL (ref 150–400)
RBC: 4.32 MIL/uL (ref 3.87–5.11)
RDW: 14.7 % (ref 11.5–15.5)
WBC: 7.5 10*3/uL (ref 4.0–10.5)
nRBC: 0 % (ref 0.0–0.2)

## 2022-07-12 LAB — IRON AND TIBC
Iron: 82 ug/dL (ref 28–170)
Saturation Ratios: 19 % (ref 10.4–31.8)
TIBC: 444 ug/dL (ref 250–450)
UIBC: 362 ug/dL

## 2022-07-12 LAB — FERRITIN: Ferritin: 28 ng/mL (ref 11–307)

## 2022-07-12 LAB — VITAMIN B12: Vitamin B-12: 424 pg/mL (ref 180–914)

## 2022-07-12 MED ORDER — CYANOCOBALAMIN 1000 MCG/ML IJ SOLN
1000.0000 ug | Freq: Once | INTRAMUSCULAR | Status: AC
  Administered 2022-07-12: 1000 ug via INTRAMUSCULAR
  Filled 2022-07-12: qty 1

## 2022-07-16 ENCOUNTER — Encounter: Payer: Self-pay | Admitting: Hematology and Oncology

## 2022-07-16 NOTE — Progress Notes (Signed)
Hematology/Oncology Consult note [facility]  Telephone:([phone] [phone]  Patient Care Team: Carolynne Edouard as PCP - General (Physician Assistant) Sindy Guadeloupe, MD as Consulting Physician (Oncology)   Name of the patient: Jamie Bennett  [id number]  1960/09/28   Date of visit: 07/16/22  Diagnosis- iron and b12 deficiency anemia  Chief complaint/ Reason for visit- routine f/u of anemia  Heme/Onc history: Patient is a 61 year old female with history of gastric bypass surgery in 2003.  She is subsequent developed iron and B12 deficiency and has been getting IV iron from time to time.  She was previously seen Dr. Mike Gip and transferring her care to me   Interval history-patient reports some ongoing fatigue.  Denies any blood loss in her stool or urine.  ECOG PS- 1 Pain scale- 0   Review of systems- Review of Systems  Constitutional:  Positive for malaise/fatigue. Negative for chills, fever and weight loss.  HENT:  Negative for congestion, ear discharge and nosebleeds.   Eyes:  Negative for blurred vision.  Respiratory:  Negative for cough, hemoptysis, sputum production, shortness of breath and wheezing.   Cardiovascular:  Negative for chest pain, palpitations, orthopnea and claudication.  Gastrointestinal:  Negative for abdominal pain, blood in stool, constipation, diarrhea, heartburn, melena, nausea and vomiting.  Genitourinary:  Negative for dysuria, flank pain, frequency, hematuria and urgency.  Musculoskeletal:  Negative for back pain, joint pain and myalgias.  Skin:  Negative for rash.  Neurological:  Negative for dizziness, tingling, focal weakness, seizures, weakness and headaches.  Endo/Heme/Allergies:  Does not bruise/bleed easily.  Psychiatric/Behavioral:  Negative for depression and suicidal ideas. The patient does not have insomnia.       Allergies  Allergen Reactions   Lactose Intolerance (Gi) Other  (See Comments)    Bloating and GI distress   Sulfa Antibiotics Hives     Past Medical History:  Diagnosis Date   Anemia    Anxiety    Bipolar 1 disorder ([place])    Collagen vascular disease ([place])    rhematoid arthritis   Constipation    Dyspnea    Fibromyalgia    GERD (gastroesophageal reflux disease)    Heart murmur    mild-asymptomatic   Hepatic steatosis    History of kidney stones    History of Roux-en-Y gastric bypass    Hypotension    Hypothyroidism    Morbid obesity with BMI of 50.0-59.9, adult ([place])    Opioid abuse ([place])    Osteoarthritis    Osteoporosis    PONV (postoperative nausea and vomiting)    nausea only   Rheumatic fever    Rheumatoid arthritis ([place])    Small bowel obstruction ([place])    Thyroid disease      Past Surgical History:  Procedure Laterality Date   CHOLECYSTECTOMY  2004   COLONOSCOPY N/A 07/16/2017   Procedure: COLONOSCOPY;  Surgeon: Manya Silvas, MD;  Location: [facility] ENDOSCOPY;  Service: Endoscopy;  Laterality: N/A;   EXPLORATORY LAPAROTOMY  2009   GASTRIC BYPASS  2003   [facility]   HEEL SPUR EXCISION Bilateral    KNEE ARTHROSCOPY WITH SUBCHONDROPLASTY Left 09/27/2021   Procedure: Left knee subchondroplasty medial tibial plateau;  Surgeon: Hessie Knows, MD;  Location: [facility] ORS;  Service: Orthopedics;  Laterality: Left;   LAPAROTOMY N/A 03/01/2018   Procedure: EXPLORATORY LAPAROTOMY/ UMBILECTOMY;  Surgeon: Robert Bellow, MD;  Location: [facility] ORS;  Service: General;  Laterality: N/A;   TONSILLECTOMY  VENTRAL HERNIA REPAIR N/A 03/07/2018   Procedure: HERNIA REPAIR VENTRAL ADULT;  Surgeon: Robert Bellow, MD;  Location: [facility] ORS;  Service: General;  Laterality: N/A;    Social History   Socioeconomic History   Marital status: Married    Spouse name: Not on file   Number of children: Not on file   Years of education: Not on file   Highest education level: Not on file  Occupational History   Not on file  Tobacco  Use   Smoking status: Never    Passive exposure: Past   Smokeless tobacco: Never  Vaping Use   Vaping Use: Never used  Substance and Sexual Activity   Alcohol use: Not Currently   Drug use: No   Sexual activity: Yes  Other Topics Concern   Not on file  Social History Narrative   Not on file   Social Determinants of Health   Financial Resource Strain: Not on file  Food Insecurity: Not on file  Transportation Needs: Not on file  Physical Activity: Not on file  Stress: Not on file  Social Connections: Not on file  Intimate Partner Violence: Not on file    Family History  Problem Relation Age of Onset   Depression Mother    Dementia Mother    Heart disease Mother    Osteoporosis Mother    Parkinson's disease Father    Liver disease Brother    Colon cancer Neg Hx      Current Outpatient Medications:    acetaminophen (TYLENOL) 500 MG tablet, Take 500-1,000 mg by mouth every 6 (six) hours as needed (pain.)., Disp: , Rfl:    albuterol (VENTOLIN HFA) 108 (90 Base) MCG/ACT inhaler, Inhale 2 puffs into the lungs every 6 (six) hours as needed for wheezing or shortness of breath., Disp: 8 g, Rfl: 3   busPIRone (BUSPAR) 30 MG tablet, 1 bid, Disp: 180 tablet, Rfl: 2   Calcium Carbonate (CALCIUM 600 PO), Take 600 mg by mouth in the morning and at bedtime., Disp: , Rfl:    Cholecalciferol 125 MCG (5000 UT) TABS, Take 5,000 Units by mouth in the morning., Disp: , Rfl:    clonazePAM (KLONOPIN) 0.5 MG tablet, TAKE 1 TABLET BY MOUTH ONCE DAILY AS NEEDED FOR  ACUTE  ANXIETY. Pt may take 1 extra tablet (0.5 mg) PRN as needed for severe anxiety., Disp: 35 tablet, Rfl: 5   Coenzyme Q10 300 MG CAPS, Take 300 mg by mouth in the morning., Disp: , Rfl:    cyanocobalamin (,VITAMIN B-12,) 1000 MCG/ML injection, Inject 1,000 mcg into the muscle every 30 (thirty) days. , Disp: , Rfl:    denosumab (PROLIA) 60 MG/ML SOSY injection, Inject 60 mg into the skin every 6 (six) months., Disp: , Rfl:     desipramine (NORPRAMIN) 25 MG tablet, Take 1 tablet (25 mg total) by mouth daily., Disp: 90 tablet, Rfl: 0   esomeprazole (NEXIUM) 20 MG capsule, Take 20 mg by mouth in the morning., Disp: , Rfl:    estradiol (ESTRACE) 0.1 MG/GM vaginal cream, Apply a pea-sized amount to index finger and wipe in vaginal introitus twice weekly, Disp: 42.5 g, Rfl: 12   hydroxychloroquine (PLAQUENIL) 200 MG tablet, Take 1 tablet (200 mg total) by mouth 2 (two) times daily., Disp: 180 tablet, Rfl: 0   hydrOXYzine (ATARAX) 50 MG tablet, Take 1 tablet (50 mg total) by mouth 3 (three) times daily as needed for anxiety., Disp: 270 tablet, Rfl: 2   Krill Oil 500  MG CAPS, Take 500 mg by mouth daily at 12 noon., Disp: , Rfl:    lamoTRIgine (LAMICTAL) 150 MG tablet, Take 1 tablet (150 mg total) by mouth 2 (two) times daily., Disp: 180 tablet, Rfl: 1   levothyroxine (SYNTHROID) 112 MCG tablet, Take 1 tablet (112 mcg total) by mouth daily before breakfast., Disp: 90 tablet, Rfl: 1   loratadine (CLARITIN) 10 MG tablet, Take 10 mg by mouth daily as needed for allergies., Disp: , Rfl:    magnesium oxide (MAG-OX) 400 MG tablet, Take 400 mg by mouth every evening., Disp: , Rfl:    Multiple Vitamin (MULTIVITAMIN) tablet, Take 1 tablet by mouth every evening., Disp: , Rfl:    mupirocin ointment (BACTROBAN) 2 %, Apply 1 Application topically 2 (two) times daily., Disp: 30 g, Rfl: 3   nitrofurantoin (MACRODANTIN) 50 MG capsule, Take 1 capsule (50 mg total) by mouth daily., Disp: 90 capsule, Rfl: 1   nystatin (MYCOSTATIN/NYSTOP) powder, Apply 1 Application topically 2 (two) times daily., Disp: 30 g, Rfl: 6   polyethylene glycol (MIRALAX / GLYCOLAX) 17 g packet, Take 17 g by mouth in the morning and at bedtime., Disp: , Rfl:    psyllium (METAMUCIL) 58.6 % packet, Take 1 packet by mouth in the morning., Disp: , Rfl:    sucralfate (CARAFATE) 1 g tablet, Take 1 tablet (1 g total) by mouth 2 (two) times daily., Disp: 180 tablet, Rfl: 1    tiZANidine (ZANAFLEX) 4 MG tablet, Take 1 tablet (4 mg total) by mouth 3 (three) times daily., Disp: 270 tablet, Rfl: 0   traZODone (DESYREL) 100 MG tablet, Take 1 tablet (100 mg total) by mouth at bedtime as needed and may repeat dose one time if needed for sleep., Disp: 60 tablet, Rfl: 5   venlafaxine (EFFEXOR) 100 MG tablet, Take 1 tablet (100 mg total) by mouth 3 (three) times daily with meals., Disp: 270 tablet, Rfl: 2   vitamin C (ASCORBIC ACID) 500 MG tablet, Take 500 mg by mouth in the morning., Disp: , Rfl:    zinc gluconate 50 MG tablet, Take 50 mg by mouth in the morning., Disp: , Rfl:   Physical exam:  Vitals:   07/12/22 1320  BP: (!) 167/85  Pulse: 76  Temp: 97.6 F (36.4 C)  TempSrc: Tympanic  SpO2: 96%  Weight: (!) 347 lb (157.4 kg)  Height: '5\' 5"'$  (1.651 m)   Physical Exam Constitutional:      General: She is not in acute distress. Cardiovascular:     Rate and Rhythm: Normal rate and regular rhythm.     Heart sounds: Normal heart sounds.  Pulmonary:     Effort: Pulmonary effort is normal.     Breath sounds: Normal breath sounds.  Abdominal:     General: Bowel sounds are normal.     Palpations: Abdomen is soft.  Skin:    General: Skin is warm and dry.  Neurological:     Mental Status: She is alert and oriented to person, place, and time.         Latest Ref Rng & Units 05/10/2022    2:09 PM  CMP  Glucose 65 - 99 mg/dL 85   BUN 7 - 25 mg/dL 20   Creatinine 0.50 - 1.05 mg/dL 0.75   Sodium 135 - 146 mmol/L 142   Potassium 3.5 - 5.3 mmol/L 3.7   Chloride 98 - 110 mmol/L 109   CO2 20 - 32 mmol/L 21   Calcium 8.6 -  10.4 mg/dL 9.4   Total Protein 6.1 - 8.1 g/dL 6.7   Total Bilirubin 0.2 - 1.2 mg/dL 0.3   AST 10 - 35 U/L 13   ALT 6 - 29 U/L 9       Latest Ref Rng & Units 07/12/2022    1:05 PM  CBC  WBC 4.0 - 10.5 K/uL 7.5   Hemoglobin 12.0 - 15.0 g/dL 12.6   Hematocrit 36.0 - 46.0 % 38.7   Platelets 150 - 400 K/uL 214     No images are attached to  the encounter.  DG Chest 2 View  Result Date: 07/08/2022 CLINICAL DATA:  Cough and wheezing, COVID 2 weeks ago EXAM: CHEST - 2 VIEW COMPARISON:  12/28/2020 FINDINGS: Frontal and lateral views of the chest demonstrate an unremarkable cardiac silhouette. No acute airspace disease, effusion, or pneumothorax. No acute bony abnormalities. IMPRESSION: 1. No acute intrathoracic process. Electronically Signed   By: Randa Ngo M.D.   On: 07/08/2022 17:30     Assessment and plan- Patient is a 61 y.o. female with history of iron and B12 deficiency anemia possibly secondary to gastric bypass here for routine follow-up  Patient is not presently anemic and her hemoglobin has remained stable between 12-13 over the last few years.  However her ferritin levels are low at 28 with iron saturation of 19%.  B12 levels are normal at 424.  Iron studies resulted after the patient had left the office.  We will reach out to the patient to see if she would like to come to receive IV iron at this time.  She is coming to our office monthly for her B12 injections.  I will repeat CBC ferritin and iron studies in 6 months in 1 year and see her back in 1 year   Visit Diagnosis 1. B12 deficiency   2. History of gastric bypass   3. Iron deficiency anemia, unspecified iron deficiency anemia type      Dr. Randa Evens, MD, MPH [facility] at [facility] [phone] 07/16/2022 8:36 AM

## 2022-07-17 ENCOUNTER — Telehealth: Payer: Self-pay

## 2022-07-17 NOTE — Telephone Encounter (Signed)
Pt called that she still coughing and wheezing advised her to go to urgent care

## 2022-07-18 ENCOUNTER — Telehealth: Payer: Self-pay | Admitting: Oncology

## 2022-07-18 ENCOUNTER — Ambulatory Visit
Admission: EM | Admit: 2022-07-18 | Discharge: 2022-07-18 | Disposition: A | Discharge status: Home / Self Care | Payer: Medicare HMO | Attending: Urgent Care | Admitting: Urgent Care

## 2022-07-18 DIAGNOSIS — J22 Unspecified acute lower respiratory infection: Secondary | ICD-10-CM | POA: Diagnosis not present

## 2022-07-18 DIAGNOSIS — R062 Wheezing: Secondary | ICD-10-CM

## 2022-07-18 MED ORDER — BENZONATATE 100 MG PO CAPS: ORAL_CAPSULE | ORAL | 0 refills | Status: DC

## 2022-07-18 MED ORDER — AMOXICILLIN-POT CLAVULANATE 875-125 MG PO TABS: 1.0000 | ORAL_TABLET | Freq: Two times a day (BID) | ORAL | 0 refills | Status: DC

## 2022-07-18 MED ORDER — PROMETHAZINE-DM 6.25-15 MG/5ML PO SYRP: 5.0000 mL | ORAL_SOLUTION | Freq: Four times a day (QID) | ORAL | 0 refills | Status: AC | PRN

## 2022-07-18 MED ORDER — AZITHROMYCIN 250 MG PO TABS: 250.0000 mg | ORAL_TABLET | Freq: Every day | ORAL | 0 refills | Status: DC

## 2022-07-18 MED ORDER — PREDNISONE 20 MG PO TABS: ORAL_TABLET | ORAL | 0 refills | Status: AC

## 2022-07-18 NOTE — ED Provider Notes (Signed)
Jamie Bennett    CSN: [id number] Arrival date & time: 07/18/22  1333      History   Chief Complaint Chief Complaint  Patient presents with   Cough   Wheezing    HPI Jamie Bennett is a 61 y.o. female.    Cough Associated symptoms: wheezing   Wheezing Associated symptoms: cough     Patient presents to urgent care with complaint of cough and wheezing since November 19.  She states she had chest x-ray done 2 weeks ago which was unremarkable.  Chest x-ray on 07/07/2022 showing no acute intrathoracic process.  Patient had video visit on 07/07/2022 for post COVID chronic cough.  Telephone encounter on 06/18/2022 stating that she tested positive for COVID and requesting antiviral medication.  She endorses sinus infection with clear nasal drainage.  Past Medical History:  Diagnosis Date   Anemia    Anxiety    Bipolar 1 disorder ([place])    Collagen vascular disease ([place])    rhematoid arthritis   Constipation    Dyspnea    Fibromyalgia    GERD (gastroesophageal reflux disease)    Heart murmur    mild-asymptomatic   Hepatic steatosis    History of kidney stones    History of Roux-en-Y gastric bypass    Hypotension    Hypothyroidism    Morbid obesity with BMI of 50.0-59.9, adult (HCC)    Opioid abuse ([place])    Osteoarthritis    Osteoporosis    PONV (postoperative nausea and vomiting)    nausea only   Rheumatic fever    Rheumatoid arthritis ([place])    Small bowel obstruction ([place])    Thyroid disease     Patient Active Problem List   Diagnosis Date Noted   Chronic knee pain (1ry area of Pain) (Left) 04/19/2022   Tricompartment osteoarthritis of knee (Left) 04/19/2022   Primary osteoarthritis of knee (Left) 04/19/2022   Chronic anticoagulation ([place]) 04/19/2022   History of attempted suicide (07/10/2016) 04/18/2022   History of drug overdose (11/08/2015) 04/18/2022   Chronic pain syndrome 04/17/2022   Pharmacologic therapy 04/17/2022   Disorder of  skeletal system 04/17/2022   Problems influencing health status 04/17/2022   Neck pain, musculoskeletal 03/24/2020   Bipolar affective disorder, current episode mixed ([place]) 11/09/2019   Encounter for general adult medical examination with abnormal findings 06/15/2019   Closed fracture of elbow (Left) 06/15/2019   Urinary tract infection without hematuria 06/15/2019   Encounter for long-term (current) use of medications 06/15/2019   Acute upper respiratory infection 06/06/2019   Exposure to COVID-19 virus 06/06/2019   BMI 50.0-59.9, adult ([place]) 06/03/2019   Exercise-induced asthma 12/15/2018   Screening for breast cancer 05/29/2018   Duodenal ulcer disease 05/29/2018   Screening for malignant neoplasm of cervix 05/29/2018   Dysuria 05/29/2018   Surgical wound dehiscence, initial encounter 04/14/2018   Postoperative abdominal hernia with obstruction    Incarcerated ventral hernia 03/22/2018   SBO (small bowel obstruction) ([place]) 03/07/2018   Persistent umbilical sinus 50/03/3817   Atopic dermatitis 12/05/2017   Adjustment disorder with mixed anxiety and depressed mood 03/10/2017   Major depressive disorder 03/10/2017   Primary osteoarthritis of knees (Bilateral) 01/19/2017   Suicide attempt ([place]) 07/10/2016   Fibromyalgia 07/10/2016   High risk medication use 07/10/2016   Hypotension 11/09/2015   Respiratory failure ([place])    Drug overdose (11/08/2015) (undetermined intent) 11/08/2015   Fatty infiltration of liver 02/16/2015   Hepatic fibrosis 02/16/2015   Iron deficiency anemia  02/05/2015   Vitamin B 12 deficiency 02/05/2015   OP (osteoporosis) 06/16/2014   Rheumatoid arteritis ([place]) 03/02/2014   Hypothyroidism 03/02/2014   Rheumatic fever without heart involvement 03/02/2014   Adult hypothyroidism 03/02/2014   Arthritis of pelvic region, degenerative 03/02/2014   History of bariatric surgery 11/24/2013    Class: History of   Bipolar affective disorder ([place]) 11/24/2013    Rheumatoid arthritis ([place]) 11/24/2013   Polysubstance (excluding opioids) dependence ([place]) 09/11/2013   Polysubstance dependence ([place]) 09/11/2013   Combined drug dependence excluding opioids ([place]) 09/11/2013   Arthritis or polyarthritis, rheumatoid ([place]) 09/05/2013   Bipolar 1 disorder, depressed ([place]) 09/05/2013    Past Surgical History:  Procedure Laterality Date   CHOLECYSTECTOMY  2004   COLONOSCOPY N/A 07/16/2017   Procedure: COLONOSCOPY;  Surgeon: Manya Silvas, MD;  Location: [facility] ENDOSCOPY;  Service: Endoscopy;  Laterality: N/A;   EXPLORATORY LAPAROTOMY  2009   GASTRIC BYPASS  2003   [facility]   HEEL SPUR EXCISION Bilateral    KNEE ARTHROSCOPY WITH SUBCHONDROPLASTY Left 09/27/2021   Procedure: Left knee subchondroplasty medial tibial plateau;  Surgeon: Hessie Knows, MD;  Location: [facility] ORS;  Service: Orthopedics;  Laterality: Left;   LAPAROTOMY N/A 03/01/2018   Procedure: EXPLORATORY LAPAROTOMY/ UMBILECTOMY;  Surgeon: Robert Bellow, MD;  Location: [facility] ORS;  Service: General;  Laterality: N/A;   TONSILLECTOMY     VENTRAL HERNIA REPAIR N/A 03/07/2018   Procedure: HERNIA REPAIR VENTRAL ADULT;  Surgeon: Robert Bellow, MD;  Location: [facility] ORS;  Service: General;  Laterality: N/A;    OB History     Gravida  2   Para  1   Term      Preterm      AB      Living         SAB      IAB      Ectopic      Multiple      Live Births           Obstetric Comments  1st Menstrual Cycle:  11 1st Pregnancy:  21           Home Medications    Prior to Admission medications   Medication Sig Start Date End Date Taking? Authorizing Provider  acetaminophen (TYLENOL) 500 MG tablet Take 500-1,000 mg by mouth every 6 (six) hours as needed (pain.).    [provider]  albuterol (VENTOLIN HFA) 108 (90 Base) MCG/ACT inhaler Inhale 2 puffs into the lungs every 6 (six) hours as needed for wheezing or shortness of breath. 03/02/22   Jonetta Osgood,  NP  busPIRone (BUSPAR) 30 MG tablet 1 bid 05/10/22   Plovsky, Berneta Sages, MD  Calcium Carbonate (CALCIUM 600 PO) Take 600 mg by mouth in the morning and at bedtime.    [provider]  Cholecalciferol 125 MCG (5000 UT) TABS Take 5,000 Units by mouth in the morning.    [provider]  clonazePAM (KLONOPIN) 0.5 MG tablet TAKE 1 TABLET BY MOUTH ONCE DAILY AS NEEDED FOR  ACUTE  ANXIETY. Pt may take 1 extra tablet (0.5 mg) PRN as needed for severe anxiety. 05/10/22   Plovsky, Berneta Sages, MD  Coenzyme Q10 300 MG CAPS Take 300 mg by mouth in the morning.    [provider]  cyanocobalamin (,VITAMIN B-12,) 1000 MCG/ML injection Inject 1,000 mcg into the muscle every 30 (thirty) days.     [provider]  denosumab (PROLIA) 60 MG/ML SOSY injection Inject 60  mg into the skin every 6 (six) months.    [provider]  desipramine (NORPRAMIN) 25 MG tablet Take 1 tablet (25 mg total) by mouth daily. 05/10/22 05/10/23  Plovsky, Berneta Sages, MD  esomeprazole (NEXIUM) 20 MG capsule Take 20 mg by mouth in the morning.    [provider]  estradiol (ESTRACE) 0.1 MG/GM vaginal cream Apply a pea-sized amount to index finger and wipe in vaginal introitus twice weekly 09/08/21   Stoioff, Ronda Fairly, MD  hydroxychloroquine (PLAQUENIL) 200 MG tablet Take 1 tablet (200 mg total) by mouth 2 (two) times daily. 05/08/22   Ofilia Neas, PA-C  hydrOXYzine (ATARAX) 50 MG tablet Take 1 tablet (50 mg total) by mouth 3 (three) times daily as needed for anxiety. 05/16/22   Plovsky, Berneta Sages, MD  Krill Oil 500 MG CAPS Take 500 mg by mouth daily at 12 noon.    [provider]  lamoTRIgine (LAMICTAL) 150 MG tablet Take 1 tablet (150 mg total) by mouth 2 (two) times daily. 05/10/22   Plovsky, Berneta Sages, MD  levothyroxine (SYNTHROID) 112 MCG tablet Take 1 tablet (112 mcg total) by mouth daily before breakfast. 06/09/22   McDonough, Si Gaul, PA-C  loratadine (CLARITIN) 10 MG tablet Take 10 mg by  mouth daily as needed for allergies.    [provider]  magnesium oxide (MAG-OX) 400 MG tablet Take 400 mg by mouth every evening.    [provider]  Multiple Vitamin (MULTIVITAMIN) tablet Take 1 tablet by mouth every evening.    [provider]  mupirocin ointment (BACTROBAN) 2 % Apply 1 Application topically 2 (two) times daily. 03/02/22   Jonetta Osgood, NP  nitrofurantoin (MACRODANTIN) 50 MG capsule Take 1 capsule (50 mg total) by mouth daily. 01/27/22   Stoioff, Ronda Fairly, MD  nystatin (MYCOSTATIN/NYSTOP) powder Apply 1 Application topically 2 (two) times daily. 03/02/22   Jonetta Osgood, NP  polyethylene glycol (MIRALAX / GLYCOLAX) 17 g packet Take 17 g by mouth in the morning and at bedtime.    [provider]  psyllium (METAMUCIL) 58.6 % packet Take 1 packet by mouth in the morning.    [provider]  sucralfate (CARAFATE) 1 g tablet Take 1 tablet (1 g total) by mouth 2 (two) times daily. 06/09/22   McDonough, Si Gaul, PA-C  tiZANidine (ZANAFLEX) 4 MG tablet Take 1 tablet (4 mg total) by mouth 3 (three) times daily. 06/09/22   McDonough, Si Gaul, PA-C  traZODone (DESYREL) 100 MG tablet Take 1 tablet (100 mg total) by mouth at bedtime as needed and may repeat dose one time if needed for sleep. 05/10/22   Plovsky, Berneta Sages, MD  venlafaxine (EFFEXOR) 100 MG tablet Take 1 tablet (100 mg total) by mouth 3 (three) times daily with meals. 05/10/22 05/10/23  Plovsky, Berneta Sages, MD  vitamin C (ASCORBIC ACID) 500 MG tablet Take 500 mg by mouth in the morning.    [provider]  zinc gluconate 50 MG tablet Take 50 mg by mouth in the morning.    [provider]    Family History Family History  Problem Relation Age of Onset   Depression Mother    Dementia Mother    Heart disease Mother    Osteoporosis Mother    Parkinson's disease Father    Liver disease Brother    Colon cancer Neg Hx     Social History Social History   Tobacco  Use   Smoking status: Never    Passive exposure: Past  Smokeless tobacco: Never  Vaping Use   Vaping Use: Never used  Substance Use Topics   Alcohol use: Not Currently   Drug use: No     Allergies   Lactose intolerance (gi) and Sulfa antibiotics   Review of Systems Review of Systems  Respiratory:  Positive for cough and wheezing.      Physical Exam Triage Vital Signs ED Triage Vitals [07/18/22 1420]  Enc Vitals Group     BP (!) 159/92     Pulse Rate 75     Resp 17     Temp (!) 97.5 F (36.4 C)     Temp src      SpO2 95 %     Weight      Height      Head Circumference      Peak Flow      Pain Score 0     Pain Loc      Pain Edu?      Excl. in [place]?    No data found.  Updated Vital Signs BP (!) 159/92   Pulse 75   Temp (!) 97.5 F (36.4 C)   Resp 17   SpO2 95%   Visual Acuity Right Eye Distance:   Left Eye Distance:   Bilateral Distance:    Right Eye Near:   Left Eye Near:    Bilateral Near:     Physical Exam Vitals reviewed.  Constitutional:      Appearance: She is ill-appearing.  Cardiovascular:     Rate and Rhythm: Normal rate and regular rhythm.  Pulmonary:     Effort: Pulmonary effort is normal.     Breath sounds: Wheezing present.  Skin:    General: Skin is warm and dry.  Neurological:     General: No focal deficit present.     Mental Status: She is alert and oriented to person, place, and time.  Psychiatric:        Mood and Affect: Mood normal.        Behavior: Behavior normal.      UC Treatments / Results  Labs (all labs ordered are listed, but only abnormal results are displayed) Labs Reviewed - No data to display  EKG   Radiology No results found.  Procedures Procedures (including critical care time)  Medications Ordered in UC Medications - No data to display  Initial Impression / Assessment and Plan / UC Course  I have reviewed the triage vital signs and the nursing notes.  Pertinent labs & imaging results  that were available during my care of the patient were reviewed by me and considered in my medical decision making (see chart for details).   Patient is afebrile here without recent antipyretics. Satting well on room air. Overall is well appearing, well hydrated, without respiratory distress. Pulmonary exam is remarkable for multilobe wheezing and severe dry cough.   Concern for secondary bacterial infection with bronchitis.  Will treat bacterial infection with Augmentin and azithromycin given her severe comorbidities and concern for atypical pneumonia.  Will also give another course of corticosteroid. Benzonatate and promethazine-DM for cough.  Final Clinical Impressions(s) / UC Diagnoses   Final diagnoses:  None   Discharge Instructions   None    ED Prescriptions   None    PDMP not reviewed this encounter.   Rose Phi, [place] 07/18/22 1436

## 2022-07-18 NOTE — Discharge Instructions (Signed)
Follow up here or with your primary care provider if your symptoms are worsening or not improving with treatment.     

## 2022-07-18 NOTE — ED Triage Notes (Signed)
Pt. Presents to UC w/ c/o a cough and wheezing since Nov.19th. Pt. States she had a chest x-ray done 2 weeks ago as well.

## 2022-07-18 NOTE — Telephone Encounter (Signed)
Called patient to see if she would like to move forward with IV iron (per Dr. Janese Banks recommendation). Patient stated that she currently has "pretty severe COVID" and would like to wait until the new year before scheduling. She stated she would call back and let us know.

## 2022-07-21 ENCOUNTER — Other Ambulatory Visit: Payer: Self-pay | Admitting: Urology

## 2022-07-25 ENCOUNTER — Other Ambulatory Visit: Payer: Self-pay | Admitting: Urology

## 2022-08-02 ENCOUNTER — Encounter: Payer: Self-pay | Admitting: Nurse Practitioner

## 2022-08-02 ENCOUNTER — Ambulatory Visit (INDEPENDENT_AMBULATORY_CARE_PROVIDER_SITE_OTHER): Payer: Medicare HMO | Admitting: Nurse Practitioner

## 2022-08-02 VITALS — BP 164/83 | HR 72 | Temp 97.2°F | Resp 16 | Ht 65.0 in | Wt 354.6 lb

## 2022-08-02 DIAGNOSIS — R062 Wheezing: Secondary | ICD-10-CM

## 2022-08-02 DIAGNOSIS — J22 Unspecified acute lower respiratory infection: Secondary | ICD-10-CM | POA: Diagnosis not present

## 2022-08-02 DIAGNOSIS — J4541 Moderate persistent asthma with (acute) exacerbation: Secondary | ICD-10-CM | POA: Diagnosis not present

## 2022-08-02 MED ORDER — IPRATROPIUM-ALBUTEROL 0.5-2.5 (3) MG/3ML IN SOLN
3.0000 mL | Freq: Once | RESPIRATORY_TRACT | Status: AC
  Administered 2022-08-02: 3 mL via RESPIRATORY_TRACT

## 2022-08-02 MED ORDER — METHYLPREDNISOLONE ACETATE 80 MG/ML IJ SUSP
80.0000 mg | Freq: Once | INTRAMUSCULAR | Status: AC
  Administered 2022-08-02: 80 mg via INTRAMUSCULAR

## 2022-08-02 MED ORDER — PREDNISONE 50 MG PO TABS: 50.0000 mg | ORAL_TABLET | Freq: Every day | ORAL | 0 refills | Status: AC

## 2022-08-02 MED ORDER — HYDROCOD POLI-CHLORPHE POLI ER 10-8 MG/5ML PO SUER: 5.0000 mL | Freq: Two times a day (BID) | ORAL | 0 refills | Status: DC | PRN

## 2022-08-02 MED ORDER — IPRATROPIUM-ALBUTEROL 0.5-2.5 (3) MG/3ML IN SOLN: 3.0000 mL | RESPIRATORY_TRACT | 1 refills | Status: DC | PRN

## 2022-08-02 MED ORDER — LEVOFLOXACIN 750 MG PO TABS: 750.0000 mg | ORAL_TABLET | Freq: Every day | ORAL | 0 refills | Status: AC

## 2022-08-02 NOTE — Progress Notes (Signed)
[facility] [place], [place] [address]  Internal MEDICINE  Office Visit Note  Patient Name: Jamie Bennett  [id number]  [id number]  Date of Service: 08/02/2022  Chief Complaint  Patient presents with   Acute Visit    Cough, congestion, covid 6 weeks ago      HPI Jamie Bennett presents for an acute sick visit for persistent URI post covid --had covid 6 weeks ago -- treated with molnupiravir in 06/18/22 --still has cough and chest congestion, wheezing, SOB, chest tightness  --ED visit on 07/18/22 for lower respiratory infection and wheezing --video visit on 07/07/22 for post-covid chronic cough and wheezing Has also had 2 courses of antibiotics in mid to late December.      Current Medication:  Outpatient Encounter Medications as of 08/02/2022  Medication Sig Note   acetaminophen (TYLENOL) 500 MG tablet Take 500-1,000 mg by mouth every 6 (six) hours as needed (pain.).    albuterol (VENTOLIN HFA) 108 (90 Base) MCG/ACT inhaler Inhale 2 puffs into the lungs every 6 (six) hours as needed for wheezing or shortness of breath.    amoxicillin-clavulanate (AUGMENTIN) 875-125 MG tablet Take 1 tablet by mouth every 12 (twelve) hours.    azithromycin (ZITHROMAX) 250 MG tablet Take 1 tablet (250 mg total) by mouth daily. Take first 2 tablets together, then 1 every day until finished.    benzonatate (TESSALON) 100 MG capsule Take 1-2 tablets 3 times a day as needed for cough    busPIRone (BUSPAR) 30 MG tablet 1 bid    Calcium Carbonate (CALCIUM 600 PO) Take 600 mg by mouth in the morning and at bedtime.    chlorpheniramine-HYDROcodone (TUSSIONEX) 10-8 MG/5ML Take 5 mLs by mouth every 12 (twelve) hours as needed for cough.    Cholecalciferol 125 MCG (5000 UT) TABS Take 5,000 Units by mouth in the morning.    clonazePAM (KLONOPIN) 0.5 MG tablet TAKE 1 TABLET BY MOUTH ONCE DAILY AS NEEDED FOR  ACUTE  ANXIETY. Pt may take 1 extra tablet (0.5 mg) PRN as needed for severe  anxiety.    Coenzyme Q10 300 MG CAPS Take 300 mg by mouth in the morning.    cyanocobalamin (,VITAMIN B-12,) 1000 MCG/ML injection Inject 1,000 mcg into the muscle every 30 (thirty) days.     denosumab (PROLIA) 60 MG/ML SOSY injection Inject 60 mg into the skin every 6 (six) months. 09/01/2021: Last dose: 07/2021   desipramine (NORPRAMIN) 25 MG tablet Take 1 tablet (25 mg total) by mouth daily.    esomeprazole (NEXIUM) 20 MG capsule Take 20 mg by mouth in the morning.    estradiol (ESTRACE) 0.1 MG/GM vaginal cream Apply a pea-sized amount to index finger and wipe in vaginal introitus twice weekly    hydroxychloroquine (PLAQUENIL) 200 MG tablet Take 1 tablet (200 mg total) by mouth 2 (two) times daily.    hydrOXYzine (ATARAX) 50 MG tablet Take 1 tablet (50 mg total) by mouth 3 (three) times daily as needed for anxiety.    ipratropium-albuterol (DUONEB) 0.5-2.5 (3) MG/3ML SOLN Take 3 mLs by nebulization every 4 (four) hours as needed.    Krill Oil 500 MG CAPS Take 500 mg by mouth daily at 12 noon.    lamoTRIgine (LAMICTAL) 150 MG tablet Take 1 tablet (150 mg total) by mouth 2 (two) times daily.    levofloxacin (LEVAQUIN) 750 MG tablet Take 1 tablet (750 mg total) by mouth daily for 7 days.    levothyroxine (SYNTHROID) 112 MCG tablet  Take 1 tablet (112 mcg total) by mouth daily before breakfast.    loratadine (CLARITIN) 10 MG tablet Take 10 mg by mouth daily as needed for allergies.    magnesium oxide (MAG-OX) 400 MG tablet Take 400 mg by mouth every evening.    Multiple Vitamin (MULTIVITAMIN) tablet Take 1 tablet by mouth every evening.    mupirocin ointment (BACTROBAN) 2 % Apply 1 Application topically 2 (two) times daily.    nystatin (MYCOSTATIN/NYSTOP) powder Apply 1 Application topically 2 (two) times daily.    polyethylene glycol (MIRALAX / GLYCOLAX) 17 g packet Take 17 g by mouth in the morning and at bedtime.    predniSONE (DELTASONE) 50 MG tablet Take 1 tablet (50 mg total) by mouth daily  with breakfast for 4 days.    psyllium (METAMUCIL) 58.6 % packet Take 1 packet by mouth in the morning.    sucralfate (CARAFATE) 1 g tablet Take 1 tablet (1 g total) by mouth 2 (two) times daily.    tiZANidine (ZANAFLEX) 4 MG tablet Take 1 tablet (4 mg total) by mouth 3 (three) times daily.    traZODone (DESYREL) 100 MG tablet Take 1 tablet (100 mg total) by mouth at bedtime as needed and may repeat dose one time if needed for sleep.    venlafaxine (EFFEXOR) 100 MG tablet Take 1 tablet (100 mg total) by mouth 3 (three) times daily with meals.    vitamin C (ASCORBIC ACID) 500 MG tablet Take 500 mg by mouth in the morning.    zinc gluconate 50 MG tablet Take 50 mg by mouth in the morning.    [EXPIRED] ipratropium-albuterol (DUONEB) 0.5-2.5 (3) MG/3ML nebulizer solution 3 mL     [EXPIRED] methylPREDNISolone acetate (DEPO-MEDROL) injection 80 mg     No facility-administered encounter medications on file as of 08/02/2022.      Medical History: Past Medical History:  Diagnosis Date   Anemia    Anxiety    Bipolar 1 disorder ([place])    Collagen vascular disease ([place])    rhematoid arthritis   Constipation    Dyspnea    Fibromyalgia    GERD (gastroesophageal reflux disease)    Heart murmur    mild-asymptomatic   Hepatic steatosis    History of kidney stones    History of Roux-en-Y gastric bypass    Hypotension    Hypothyroidism    Morbid obesity with BMI of 50.0-59.9, adult (HCC)    Opioid abuse (HCC)    Osteoarthritis    Osteoporosis    PONV (postoperative nausea and vomiting)    nausea only   Rheumatic fever    Rheumatoid arthritis (HCC)    Small bowel obstruction (HCC)    Thyroid disease      Vital Signs: BP (!) 164/83   Pulse 72   Temp (!) 97.2 F (36.2 C)   Resp 16   Ht '5\' 5"'$  (1.651 m)   Wt (!) 354 lb 9.6 oz (160.8 kg)   SpO2 97%   BMI 59.01 kg/m    Review of Systems  Constitutional:  Positive for activity change and fatigue. Negative for chills and fever.  HENT:   Positive for congestion, postnasal drip, sinus pressure and sinus pain. Negative for sore throat.   Respiratory:  Positive for cough, chest tightness, shortness of breath and wheezing.        Orthopnea  Cardiovascular: Negative.  Negative for chest pain and palpitations.  Gastrointestinal: Negative.   Musculoskeletal:  Negative for myalgias.  Neurological:  Positive for headaches.    Physical Exam Constitutional:      General: She is awake. She is in acute distress.     Appearance: She is obese. She is ill-appearing.  HENT:     Head: Normocephalic and atraumatic.  Cardiovascular:     Rate and Rhythm: Normal rate and regular rhythm.     Heart sounds: Normal heart sounds, S1 normal and S2 normal.  Pulmonary:     Effort: Accessory muscle usage and respiratory distress (moderate) present.     Breath sounds: Decreased air movement present. Examination of the right-upper field reveals decreased breath sounds and wheezing. Examination of the left-upper field reveals decreased breath sounds and wheezing. Examination of the right-middle field reveals decreased breath sounds and wheezing. Examination of the left-middle field reveals decreased breath sounds and wheezing. Examination of the right-lower field reveals decreased breath sounds. Examination of the left-lower field reveals decreased breath sounds. Decreased breath sounds and wheezing present.  Neurological:     Mental Status: She is alert.  Psychiatric:        Behavior: Behavior is cooperative.       Assessment/Plan: 1. Acute lower respiratory infection Levofloxacin and prednisone prescribed.  - ipratropium-albuterol (DUONEB) 0.5-2.5 (3) MG/3ML nebulizer solution 3 mL - methylPREDNISolone acetate (DEPO-MEDROL) injection 80 mg - levofloxacin (LEVAQUIN) 750 MG tablet; Take 1 tablet (750 mg total) by mouth daily for 7 days.  Dispense: 7 tablet; Refill: 0 - chlorpheniramine-HYDROcodone (TUSSIONEX) 10-8 MG/5ML; Take 5 mLs by mouth  every 12 (twelve) hours as needed for cough.  Dispense: 140 mL; Refill: 0  2. Moderate persistent asthma with acute exacerbation Depo-medrol 60 mg injection administered in office for inflammation and wheezing and patient received duoneb nebulizer treatment in office today. Lung sounds did slightly improve with treatment. Still wheezing but there was increased movement in the lung fields after the neb treatment. Low threshold for ER visit. Instructed patient to go to ER or call 911 if her breathing gets worse or she can't catch her breath, unable to speak due to SOB. Husband was present at visit and he agreed to take her to the ER if she does not show improvement or if her breathing gets any worse.  - ipratropium-albuterol (DUONEB) 0.5-2.5 (3) MG/3ML nebulizer solution 3 mL - methylPREDNISolone acetate (DEPO-MEDROL) injection 80 mg - predniSONE (DELTASONE) 50 MG tablet; Take 1 tablet (50 mg total) by mouth daily with breakfast for 4 days.  Dispense: 4 tablet; Refill: 0 - levofloxacin (LEVAQUIN) 750 MG tablet; Take 1 tablet (750 mg total) by mouth daily for 7 days.  Dispense: 7 tablet; Refill: 0 - ipratropium-albuterol (DUONEB) 0.5-2.5 (3) MG/3ML SOLN; Take 3 mLs by nebulization every 4 (four) hours as needed.  Dispense: 360 mL; Refill: 1 - For home use only DME Nebulizer machine  3. Wheezing Neb tx and depo medrol injection  see problems #1 and 2 - ipratropium-albuterol (DUONEB) 0.5-2.5 (3) MG/3ML nebulizer solution 3 mL - methylPREDNISolone acetate (DEPO-MEDROL) injection 80 mg - predniSONE (DELTASONE) 50 MG tablet; Take 1 tablet (50 mg total) by mouth daily with breakfast for 4 days.  Dispense: 4 tablet; Refill: 0   General Counseling: Laronda verbalizes understanding of the findings of todays visit and agrees with plan of treatment. I have discussed any further diagnostic evaluation that may be needed or ordered today. We also reviewed her medications today. she has been encouraged to call the  office with any questions or concerns that should arise related to todays visit.  Counseling:    No orders of the defined types were placed in this encounter.   Meds ordered this encounter  Medications   ipratropium-albuterol (DUONEB) 0.5-2.5 (3) MG/3ML nebulizer solution 3 mL   methylPREDNISolone acetate (DEPO-MEDROL) injection 80 mg   predniSONE (DELTASONE) 50 MG tablet    Sig: Take 1 tablet (50 mg total) by mouth daily with breakfast for 4 days.    Dispense:  4 tablet    Refill:  0   levofloxacin (LEVAQUIN) 750 MG tablet    Sig: Take 1 tablet (750 mg total) by mouth daily for 7 days.    Dispense:  7 tablet    Refill:  0   chlorpheniramine-HYDROcodone (TUSSIONEX) 10-8 MG/5ML    Sig: Take 5 mLs by mouth every 12 (twelve) hours as needed for cough.    Dispense:  140 mL    Refill:  0   ipratropium-albuterol (DUONEB) 0.5-2.5 (3) MG/3ML SOLN    Sig: Take 3 mLs by nebulization every 4 (four) hours as needed.    Dispense:  360 mL    Refill:  1    Return if symptoms worsen or fail to improve, for low threshhold for ER, discussed red flags and patient agreed will go if not improving.   Controlled Substance Database was reviewed by me for overdose risk score (ORS)  Time spent:30 Minutes Time spent with patient included reviewing progress notes, labs, imaging studies, and discussing plan for follow up.   This patient was seen by Jonetta Osgood, FNP-C in collaboration with Dr. Clayborn Bigness as a part of collaborative care agreement.  Jermiyah Ricotta R. Valetta Fuller, MSN, FNP-C Internal Medicine

## 2022-08-03 ENCOUNTER — Emergency Department: Payer: Medicare HMO

## 2022-08-03 ENCOUNTER — Encounter: Payer: Self-pay | Admitting: Emergency Medicine

## 2022-08-03 ENCOUNTER — Telehealth: Payer: Self-pay

## 2022-08-03 ENCOUNTER — Other Ambulatory Visit: Payer: Self-pay

## 2022-08-03 ENCOUNTER — Emergency Department
Admission: EM | Admit: 2022-08-03 | Discharge: 2022-08-03 | Disposition: A | Discharge status: Home / Self Care | Payer: Medicare HMO | Attending: Emergency Medicine | Admitting: Emergency Medicine

## 2022-08-03 DIAGNOSIS — J4541 Moderate persistent asthma with (acute) exacerbation: Secondary | ICD-10-CM | POA: Insufficient documentation

## 2022-08-03 DIAGNOSIS — R0602 Shortness of breath: Secondary | ICD-10-CM | POA: Diagnosis not present

## 2022-08-03 DIAGNOSIS — R059 Cough, unspecified: Secondary | ICD-10-CM | POA: Diagnosis not present

## 2022-08-03 DIAGNOSIS — R062 Wheezing: Secondary | ICD-10-CM | POA: Diagnosis not present

## 2022-08-03 DIAGNOSIS — M47814 Spondylosis without myelopathy or radiculopathy, thoracic region: Secondary | ICD-10-CM | POA: Diagnosis not present

## 2022-08-03 DIAGNOSIS — U099 Post covid-19 condition, unspecified: Secondary | ICD-10-CM

## 2022-08-03 DIAGNOSIS — I77819 Aortic ectasia, unspecified site: Secondary | ICD-10-CM | POA: Insufficient documentation

## 2022-08-03 DIAGNOSIS — U071 COVID-19: Secondary | ICD-10-CM | POA: Diagnosis not present

## 2022-08-03 DIAGNOSIS — I712 Thoracic aortic aneurysm, without rupture, unspecified: Secondary | ICD-10-CM | POA: Diagnosis not present

## 2022-08-03 LAB — CBC WITH DIFFERENTIAL/PLATELET
Abs Immature Granulocytes: 0.03 10*3/uL (ref 0.00–0.07)
Basophils Absolute: 0 10*3/uL (ref 0.0–0.1)
Basophils Relative: 1 %
Eosinophils Absolute: 0.2 10*3/uL (ref 0.0–0.5)
Eosinophils Relative: 3 %
HCT: 36.9 % (ref 36.0–46.0)
Hemoglobin: 11.8 g/dL — ABNORMAL LOW (ref 12.0–15.0)
Immature Granulocytes: 1 %
Lymphocytes Relative: 12 %
Lymphs Abs: 0.7 10*3/uL (ref 0.7–4.0)
MCH: 29.6 pg (ref 26.0–34.0)
MCHC: 32 g/dL (ref 30.0–36.0)
MCV: 92.7 fL (ref 80.0–100.0)
Monocytes Absolute: 0.2 10*3/uL (ref 0.1–1.0)
Monocytes Relative: 4 %
Neutro Abs: 5 10*3/uL (ref 1.7–7.7)
Neutrophils Relative %: 79 %
Platelets: 223 10*3/uL (ref 150–400)
RBC: 3.98 MIL/uL (ref 3.87–5.11)
RDW: 15.3 % (ref 11.5–15.5)
WBC: 6.2 10*3/uL (ref 4.0–10.5)
nRBC: 0 % (ref 0.0–0.2)

## 2022-08-03 LAB — BASIC METABOLIC PANEL
Anion gap: 10 (ref 5–15)
BUN: 17 mg/dL (ref 8–23)
CO2: 21 mmol/L — ABNORMAL LOW (ref 22–32)
Calcium: 8.8 mg/dL — ABNORMAL LOW (ref 8.9–10.3)
Chloride: 110 mmol/L (ref 98–111)
Creatinine, Ser: 0.81 mg/dL (ref 0.44–1.00)
GFR, Estimated: >60 mL/min (ref >60)
Glucose, Bld: 126 mg/dL — ABNORMAL HIGH (ref 70–99)
Potassium: 3.6 mmol/L (ref 3.5–5.1)
Sodium: 141 mmol/L (ref 135–145)

## 2022-08-03 MED ORDER — IPRATROPIUM-ALBUTEROL 0.5-2.5 (3) MG/3ML IN SOLN
3.0000 mL | Freq: Once | RESPIRATORY_TRACT | Status: AC
  Administered 2022-08-03: 3 mL via RESPIRATORY_TRACT
  Filled 2022-08-03: qty 3

## 2022-08-03 MED ORDER — COMPRESSOR/NEBULIZER MISC: 0 refills | Status: DC

## 2022-08-03 MED ORDER — ALBUTEROL SULFATE (2.5 MG/3ML) 0.083% IN NEBU
3.0000 mL | INHALATION_SOLUTION | RESPIRATORY_TRACT | Status: DC | PRN
  Filled 2022-08-03: qty 3

## 2022-08-03 MED ORDER — IOHEXOL 350 MG/ML SOLN
75.0000 mL | Freq: Once | INTRAVENOUS | Status: AC | PRN
  Administered 2022-08-03: 80 mL via INTRAVENOUS

## 2022-08-03 NOTE — ED Triage Notes (Signed)
States had COVID in November.  C/O persistent cough and wheezing.  Has taken a couple of rounds of prednisone without relief. Seen by PCP for same, given an albuterol inhaler and a shot of prednisone.  Prescribed Prednisone and levoquin and tessalon [place].  Has had one dose of the levoquin and tessalon.  Continues to c/o persistant cough and wheezing. States unable to lay flat.

## 2022-08-03 NOTE — ED Notes (Signed)
Attempted for 20g x2. Will obtain US to place IV.

## 2022-08-03 NOTE — ED Provider Notes (Signed)
[facility] Provider Note    Event Date/Time   First MD Initiated Contact with Patient 08/03/22 1507     (approximate)   History   wheezing  HPI  Jamie Bennett is a 62 y.o. female here for evaluation as she reports she continues to have wheezing and coughing for about a month.  She reports since having coronavirus infection she has had persistent coughing and wheezing.  Her doctor is treated her with multiple rounds of antibiotics but she continues to persist and feeling like she is frequently wheezing.  She keeps up pulse oximeter at home and reports it is never gone below 95%.  She reports she came in tonight as she still has not figured out why she is having the symptoms for the last month.  No chest pain  She started prednisone prescription Levaquin yesterday and received a prescription for albuterol.  She uses an albuterol inhaler at home and was prescribed nebulizer solution but was not given a prescription for nebulizer   The patient has a history of rheumatoid arthritis, arthritis, chronic Plaquenil, previous small bowel obstruction     Physical Exam   Triage Vital Signs: ED Triage Vitals  Enc Vitals Group     BP 08/03/22 1433 126/69     Pulse Rate 08/03/22 1433 72     Resp 08/03/22 1433 16     Temp 08/03/22 1433 98.2 F (36.8 C)     Temp Source 08/03/22 1433 Oral     SpO2 08/03/22 1433 98 %     Weight 08/03/22 1434 (!) 354 lb 8 oz (160.8 kg)     Height 08/03/22 1434 '5\' 5"'$  (1.651 m)     Head Circumference --      Peak Flow --      Pain Score 08/03/22 1433 0     Pain Loc --      Pain Edu? --      Excl. in [place]? --     Most recent vital signs: Vitals:   08/03/22 1700 08/03/22 1930  BP: (!) 132/57 (!) 142/64  Pulse: 67 72  Resp: 14 16  Temp: 98 F (36.7 C)   SpO2: 97% 100%     General: Awake, no distress.  Sitting comfortably.  Normal saturation on room air.  98%.  She does not appear in distress or extremis  CV:  Good  peripheral perfusion.  Normal heart tones and rate Resp:  Normal effort.  She demonstrates no wheezing, but auscultation of her lungs her lung sounds quite seem clear.  Rather the wheezing seems to be more predominant in her upper airway and around her neck area.  Additionally when she speaks the wheezing seems to go away.  Question if the patient may be having some evidence of wheezing secondary to glottic or posterior pharyngeal type etiology or functional causation.  She does not appear in distress and her work of breathing does not appear to be labored.  No crackles or rales are noted in her lung fields. Abd:  No distention.  Other:     ED Results / Procedures / Treatments   Labs (all labs ordered are listed, but only abnormal results are displayed) Labs Reviewed  CBC WITH DIFFERENTIAL/PLATELET - Abnormal; Notable for the following components:      Result Value   Hemoglobin 11.8 (*)    All other components within normal limits  BASIC METABOLIC PANEL - Abnormal; Notable for the following components:   CO2 21 (*)  Glucose, Bld 126 (*)    Calcium 8.8 (*)    All other components within normal limits     EKG  Reviewed and interpreted by me at 1440 heart rate 60 QRS 130 QTc 450 Normal sinus rhythm, occasional PAC.  No evidence of acute ischemia right bundle branch block morphology   RADIOLOGY Chest x-ray interpreted by me as negative for acute finding   CT Soft Tissue Neck W Contrast  Result Date: 08/03/2022 CLINICAL DATA:  Palate weakness, stridor versus wheezing for 1 month, question upper airway etiology EXAM: CT NECK WITH CONTRAST TECHNIQUE: Multidetector CT imaging of the neck was performed using the standard protocol following the bolus administration of intravenous contrast. RADIATION DOSE REDUCTION: This exam was performed according to the departmental dose-optimization program which includes automated exposure control, adjustment of the mA and/or kV according to patient  size and/or use of iterative reconstruction technique. CONTRAST:  71m OMNIPAQUE IOHEXOL 350 MG/ML SOLN COMPARISON:  02/28/2016 CTA neck FINDINGS: Pharynx and larynx: Normal. No mass or swelling. Salivary glands: No inflammation, mass, or stone. Thyroid: Normal. Lymph nodes: None enlarged or abnormal density. Vascular: Patent. Limited intracranial: Negative. Visualized orbits: Negative. Mastoids and visualized paranasal sinuses: Clear. Skeleton: No acute osseous abnormality. Upper chest: No focal pulmonary opacity or pleural effusion. Other: None. IMPRESSION: No acute abnormality in the neck. No etiology is seen for the patient's reported palate weakness or wheezing. Electronically Signed   By: AMerilyn BabaM.D.   On: 08/03/2022 19:49   CT Angio Chest PE W and/or Wo Contrast  Result Date: 08/03/2022 CLINICAL DATA:  History of prior COVID infection with persistent cough and wheezing, initial encounter EXAM: CT ANGIOGRAPHY CHEST WITH CONTRAST TECHNIQUE: Multidetector CT imaging of the chest was performed using the standard protocol during bolus administration of intravenous contrast. Multiplanar CT image reconstructions and MIPs were obtained to evaluate the vascular anatomy. RADIATION DOSE REDUCTION: This exam was performed according to the departmental dose-optimization program which includes automated exposure control, adjustment of the mA and/or kV according to patient size and/or use of iterative reconstruction technique. CONTRAST:  863mOMNIPAQUE IOHEXOL 350 MG/ML SOLN COMPARISON:  Chest x-ray from earlier in the same day. FINDINGS: Cardiovascular: Ascending aorta is mildly dilated 4.3 cm. No evidence of dissection is seen. No cardiac enlargement is noted. Mitral annular calcifications are seen. The pulmonary artery shows a normal branching pattern bilaterally. No filling defect to suggest pulmonary embolism is seen. Mediastinum/Nodes: Thoracic inlet is within normal limits. No hilar or mediastinal  adenopathy is noted. The esophagus as visualized is within normal limits. Lungs/Pleura: Lungs are well aerated bilaterally. No focal infiltrate or effusion is seen. No parenchymal nodules are seen. Upper Abdomen: Postsurgical changes about the stomach are noted. The remainder of the upper abdomen is within normal limits. Musculoskeletal: Degenerative changes of the thoracic spine are noted. No acute rib abnormality is seen. Review of the MIP images confirms the above findings. IMPRESSION: No evidence of pulmonary emboli. Dilatation of the ascending aorta to 4.3 cm. Recommend annual imaging followup by CTA or MRA. This recommendation follows 2010 ACCF/AHA/AATS/ACR/ASA/SCA/SCAI/SIR/STS/SVM Guidelines for the Diagnosis and Management of Patients with Thoracic Aortic Disease. Circulation. 2010; 121: : T419-Q222Aortic aneurysm NOS (ICD10-I71.9) Electronically Signed   By: MaInez Catalina.D.   On: 08/03/2022 19:32   DG Chest 2 View  Result Date: 08/03/2022 CLINICAL DATA:  Cough and wheezing EXAM: CHEST - 2 VIEW COMPARISON:  Chest 07/07/2022 FINDINGS: Heart size upper normal. Vascularity normal. Negative for heart failure. Lungs  clear without infiltrate effusion Chronic compression fracture lower thoracic spine unchanged. IMPRESSION: No active cardiopulmonary disease. Electronically Signed   By: Franchot Gallo M.D.   On: 08/03/2022 15:09     PROCEDURES:  Critical Care performed: No  Procedures   MEDICATIONS ORDERED IN ED: Medications  albuterol (PROVENTIL) (2.5 MG/3ML) 0.083% nebulizer solution 3 mL (has no administration in time range)  ipratropium-albuterol (DUONEB) 0.5-2.5 (3) MG/3ML nebulizer solution 3 mL (3 mLs Nebulization Given 08/03/22 1532)  iohexol (OMNIPAQUE) 350 MG/ML injection 75 mL (80 mLs Intravenous Contrast Given 08/03/22 1906)     IMPRESSION / MDM / ASSESSMENT AND PLAN / ED COURSE  I reviewed the triage vital signs and the nursing notes.                              Differential  diagnosis includes, but is not limited to, possible development of chronic bronchitis or bronchospasm secondary to COVID infection, bronchitis, asthma exacerbation, upper respiratory infection etc.  Also on exam she seems to have some upper airway element of more of a expiratory wheeze or stridor that seems to be upper airway involvement.  Posterior oropharynx is clear.  Her wheezing seems to go away when she is speaking, which to me I question if she may have an element of some functional type of wheezing that possibly could be originating from her upper airway but might be nonvolitional.  She has been treated with antibiotics, steroids, and is currently taking her medications which she started the oral medications today.  Her lung sounds are clear without increased work of breathing.  Because of her persistent concerns of dyspnea though and also what seems to be an upper airway wheeze, I ordered a CT scan of her chest to exclude pulmonary embolism, pneumonia, structural abnormality and also included her neck to evaluate for possible lesion or other causation that might be responsible for wheezing  She has no chest pain.  No cardiac symptoms.  Vital signs are normal.  She received prescription for albuterol nebulizer solution, but reports she does not have a nebulizer.  I sent a prescription for nebulizer to the pharmacy.  She does have an inhaler she is able to use at home now  Patient's presentation is most consistent with acute complicated illness / injury requiring diagnostic workup.  No chest pain.  EKG without evidence of ischemia.  Signs and symptoms are not consistent with ACS The patient is on the cardiac monitor to evaluate for evidence of arrhythmia and/or significant heart rate changes.    Reviewed primary care note from yesterday.  Noted that the patient had COVID 6 weeks ago and was treated with molnupiravir.  Has had persistent cough and congestion wheezing and shortness of breath.  Also  had an ER visit on December 19 for a respiratory infection and wheezing.  Patient was initiated on low [place], given Solu-Medrol injection, DuoNebs and cough suppressant medication yesterday.  Diagnosis acute lower respiratory infection with moderate persistent asthma  ----------------------------------------- 8:06 PM on 08/03/2022 ----------------------------------------- Workup here including CT scans reassuring.  She is currently resting comfortably, speaking normally and no wheezing is noted.  Her oxygen level is normal respiratory pattern and vital signs remain normal.  She reports she feels better and reassured that she does not have a problem like a blood clot in the lungs.  She and her husband are both very understanding and agreeable with the plan to follow-up closely with pulmonology.  Also recommended she set up a routine follow-up with vascular surgery for the dilated aorta and they are agreeable with this.  They will touch base with her primary care doctor and call the pulmonary clinic.  Discussed Return precautions.  I suspect she has an element of post COVID cough or possibly some type of lingering or chronic bronchitis developing but also question if she may had some functional element to wheezing as well.  She does not have evidence of any upper airway lesion and her wheezing and symptoms seem to have  resolved now.  I believe a physiologic study such as a flow testing or pulmonary clinic evaluation would be quite helpful and encouraged her and her husband to set this up which they are agreeable with.  Return precautions and treatment recommendations and follow-up discussed with the patient who is agreeable with the plan.   FINAL CLINICAL IMPRESSION(S) / ED DIAGNOSES   Final diagnoses:  Post-COVID chronic shortness of breath  Dilatation of aorta [facility])     Rx / DC Orders   ED Discharge Orders          Ordered    Nebulizers (COMPRESSOR/NEBULIZER) MISC  Status:  Discontinued         08/03/22 1527    Nebulizers (COMPRESSOR/NEBULIZER) MISC        08/03/22 2003             Note:  This document was prepared using Dragon voice recognition software and may include unintentional dictation errors.   Delman Kitten, MD 08/03/22 2009

## 2022-08-03 NOTE — ED Notes (Signed)
Pt ambulated to toilet and back to bed with a cane.

## 2022-08-03 NOTE — ED Notes (Addendum)
Attempted [place] 20g IV x1 at R fa. Pt kept shifting R arm despite education to keep it still if possible; pt denied major discomfort during attempt.

## 2022-08-03 NOTE — Telephone Encounter (Signed)
Spoke with pt had she can pickup nebulizer today she said that her breathing and wheezing getting worse she is going to ED  and relayed message to alyssa as per alyssa also advised pt yesterday that her symptoms getting worse she can go to ED

## 2022-08-03 NOTE — Addendum Note (Signed)
Addended by: Jonetta Osgood on: 08/03/2022 01:21 PM   Modules accepted: Orders

## 2022-08-03 NOTE — ED Notes (Signed)
IV team at bedside 

## 2022-08-04 ENCOUNTER — Telehealth: Payer: Self-pay

## 2022-08-04 NOTE — Telephone Encounter (Signed)
Spoke with Jamie Bennett from [facility] for that we gave pt nebulizer and order in epic as and also pt husband advised that nebulizer is ready for pickup

## 2022-08-07 ENCOUNTER — Ambulatory Visit (INDEPENDENT_AMBULATORY_CARE_PROVIDER_SITE_OTHER): Payer: Medicare HMO | Admitting: Internal Medicine

## 2022-08-07 ENCOUNTER — Encounter: Payer: Self-pay | Admitting: Internal Medicine

## 2022-08-07 VITALS — BP 133/89 | HR 78 | Temp 97.8°F | Resp 16 | Ht 65.0 in | Wt 353.0 lb

## 2022-08-07 DIAGNOSIS — R053 Chronic cough: Secondary | ICD-10-CM

## 2022-08-07 DIAGNOSIS — R0602 Shortness of breath: Secondary | ICD-10-CM | POA: Diagnosis not present

## 2022-08-07 DIAGNOSIS — U099 Post covid-19 condition, unspecified: Secondary | ICD-10-CM | POA: Diagnosis not present

## 2022-08-07 DIAGNOSIS — R918 Other nonspecific abnormal finding of lung field: Secondary | ICD-10-CM

## 2022-08-07 MED ORDER — BREZTRI AEROSPHERE 160-9-4.8 MCG/ACT IN AERO: 2.0000 | INHALATION_SPRAY | Freq: Two times a day (BID) | RESPIRATORY_TRACT | 11 refills | Status: DC

## 2022-08-07 NOTE — Progress Notes (Unsigned)
[facility] [place], [place] [address]  Pulmonary Sleep Medicine   Office Visit Note  Patient Name: Jamie Bennett DOB: August 03, 1960 MRN [id number]  Date of Service: 08/07/2022  Complaints/HPI: She had covid a few weeks ago. States she has been having wheeze and also cough. She states she had a visit to the ER. Had a work up done nothing as far as findings. She states she is having difficulty breathing at night. She states her sputum has improved. She states it was yellowish. She states she also has had several rounds of abx. No prior history of asthma. She does not smoke. She had second exposure from parents. 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 I personally reviewed the CT and it does appear to show some areas of bronchiectasis as well as GGO  ROS  General: (-) fever, (-) chills, (-) night sweats, (-) weakness Skin: (-) rashes, (-) itching,. Eyes: (-) visual changes, (-) redness, (-) itching. Nose and Sinuses: (-) nasal stuffiness or itchiness, (-) postnasal drip, (-) nosebleeds, (-) sinus trouble. Mouth and Throat: (-) sore throat, (-) hoarseness. Neck: (-) swollen glands, (-) enlarged thyroid, (-) neck pain. Respiratory: + cough, (-) bloody sputum, + shortness of breath, + wheezing. Cardiovascular: - ankle swelling, (-) chest pain. Lymphatic: (-) lymph node enlargement. Neurologic: (-) numbness, (-) tingling. Psychiatric: (-) anxiety, (-) depression   Current Medication: Outpatient Encounter Medications as of 08/07/2022  Medication Sig Note   acetaminophen (TYLENOL) 500 MG tablet Take 500-1,000 mg by mouth every 6 (six) hours as needed (pain.).    albuterol (VENTOLIN HFA) 108 (90 Base) MCG/ACT inhaler Inhale 2 puffs into the lungs every 6 (six) hours as needed for wheezing or shortness of breath.    amoxicillin-clavulanate (AUGMENTIN) 875-125 MG tablet Take 1 tablet by mouth every 12 (twelve) hours.    azithromycin  (ZITHROMAX) 250 MG tablet Take 1 tablet (250 mg total) by mouth daily. Take first 2 tablets together, then 1 every day until finished.    benzonatate (TESSALON) 100 MG capsule Take 1-2 tablets 3 times a day as needed for cough    busPIRone (BUSPAR) 30 MG tablet 1 bid    Calcium Carbonate (CALCIUM 600 PO) Take 600 mg by mouth in the morning and at bedtime.    chlorpheniramine-HYDROcodone (TUSSIONEX) 10-8 MG/5ML Take 5 mLs by mouth every 12 (twelve) hours as needed for cough.    Cholecalciferol 125 MCG (5000 UT) TABS Take 5,000 Units by mouth in the morning.    clonazePAM (KLONOPIN) 0.5 MG tablet TAKE 1 TABLET BY MOUTH ONCE DAILY AS NEEDED FOR  ACUTE  ANXIETY. Pt may take 1 extra tablet (0.5 mg) PRN as needed for severe anxiety.    Coenzyme Q10 300 MG CAPS Take 300 mg by mouth in the morning.    cyanocobalamin (,VITAMIN B-12,) 1000 MCG/ML injection Inject 1,000 mcg into the muscle every 30 (thirty) days.     denosumab (PROLIA) 60 MG/ML SOSY injection Inject 60 mg into the skin every 6 (six) months. 09/01/2021: Last dose: 07/2021   desipramine (NORPRAMIN) 25 MG tablet Take 1 tablet (25 mg total) by mouth daily.    esomeprazole (NEXIUM) 20 MG capsule Take 20 mg by mouth in the morning.    estradiol (ESTRACE) 0.1 MG/GM vaginal cream Apply a pea-sized amount to index finger and wipe in vaginal introitus twice weekly    hydroxychloroquine (PLAQUENIL) 200 MG tablet Take 1 tablet (200 mg total) by  mouth 2 (two) times daily.    hydrOXYzine (ATARAX) 50 MG tablet Take 1 tablet (50 mg total) by mouth 3 (three) times daily as needed for anxiety.    ipratropium-albuterol (DUONEB) 0.5-2.5 (3) MG/3ML SOLN Take 3 mLs by nebulization every 4 (four) hours as needed.    Krill Oil 500 MG CAPS Take 500 mg by mouth daily at 12 noon.    lamoTRIgine (LAMICTAL) 150 MG tablet Take 1 tablet (150 mg total) by mouth 2 (two) times daily.    levofloxacin (LEVAQUIN) 750 MG tablet Take 1 tablet (750 mg total) by mouth daily for 7  days.    levothyroxine (SYNTHROID) 112 MCG tablet Take 1 tablet (112 mcg total) by mouth daily before breakfast.    loratadine (CLARITIN) 10 MG tablet Take 10 mg by mouth daily as needed for allergies.    magnesium oxide (MAG-OX) 400 MG tablet Take 400 mg by mouth every evening.    Multiple Vitamin (MULTIVITAMIN) tablet Take 1 tablet by mouth every evening.    mupirocin ointment (BACTROBAN) 2 % Apply 1 Application topically 2 (two) times daily.    Nebulizers (COMPRESSOR/NEBULIZER) MISC Dispense 1 machine with associated mask and tubing    nystatin (MYCOSTATIN/NYSTOP) powder Apply 1 Application topically 2 (two) times daily.    polyethylene glycol (MIRALAX / GLYCOLAX) 17 g packet Take 17 g by mouth in the morning and at bedtime.    psyllium (METAMUCIL) 58.6 % packet Take 1 packet by mouth in the morning.    sucralfate (CARAFATE) 1 g tablet Take 1 tablet (1 g total) by mouth 2 (two) times daily.    tiZANidine (ZANAFLEX) 4 MG tablet Take 1 tablet (4 mg total) by mouth 3 (three) times daily.    traZODone (DESYREL) 100 MG tablet Take 1 tablet (100 mg total) by mouth at bedtime as needed and may repeat dose one time if needed for sleep.    venlafaxine (EFFEXOR) 100 MG tablet Take 1 tablet (100 mg total) by mouth 3 (three) times daily with meals.    vitamin C (ASCORBIC ACID) 500 MG tablet Take 500 mg by mouth in the morning.    zinc gluconate 50 MG tablet Take 50 mg by mouth in the morning.    No facility-administered encounter medications on file as of 08/07/2022.    Surgical History: Past Surgical History:  Procedure Laterality Date   CHOLECYSTECTOMY  2004   COLONOSCOPY N/A 07/16/2017   Procedure: COLONOSCOPY;  Surgeon: Manya Silvas, MD;  Location: [facility] ENDOSCOPY;  Service: Endoscopy;  Laterality: N/A;   EXPLORATORY LAPAROTOMY  2009   GASTRIC BYPASS  2003   [facility]   HEEL SPUR EXCISION Bilateral    KNEE ARTHROSCOPY WITH SUBCHONDROPLASTY Left 09/27/2021   Procedure: Left knee  subchondroplasty medial tibial plateau;  Surgeon: Hessie Knows, MD;  Location: [facility] ORS;  Service: Orthopedics;  Laterality: Left;   LAPAROTOMY N/A 03/01/2018   Procedure: EXPLORATORY LAPAROTOMY/ UMBILECTOMY;  Surgeon: Robert Bellow, MD;  Location: [facility] ORS;  Service: General;  Laterality: N/A;   TONSILLECTOMY     VENTRAL HERNIA REPAIR N/A 03/07/2018   Procedure: HERNIA REPAIR VENTRAL ADULT;  Surgeon: Robert Bellow, MD;  Location: [facility] ORS;  Service: General;  Laterality: N/A;    Medical History: Past Medical History:  Diagnosis Date   Anemia    Anxiety    Bipolar 1 disorder ([place])    Collagen vascular disease ([place])    rhematoid arthritis   Constipation    Dyspnea  Fibromyalgia    GERD (gastroesophageal reflux disease)    Heart murmur    mild-asymptomatic   Hepatic steatosis    History of kidney stones    History of Roux-en-Y gastric bypass    Hypotension    Hypothyroidism    Morbid obesity with BMI of 50.0-59.9, adult (HCC)    Opioid abuse (HCC)    Osteoarthritis    Osteoporosis    PONV (postoperative nausea and vomiting)    nausea only   Rheumatic fever    Rheumatoid arthritis (HCC)    Small bowel obstruction (HCC)    Thyroid disease     Family History: Family History  Problem Relation Age of Onset   Depression Mother    Dementia Mother    Heart disease Mother    Osteoporosis Mother    Parkinson's disease Father    Liver disease Brother    Colon cancer Neg Hx     Social History: Social History   Socioeconomic History   Marital status: Married    Spouse name: Not on file   Number of children: Not on file   Years of education: Not on file   Highest education level: Not on file  Occupational History   Not on file  Tobacco Use   Smoking status: Never    Passive exposure: Past   Smokeless tobacco: Never  Vaping Use   Vaping Use: Never used  Substance and Sexual Activity   Alcohol use: Not Currently   Drug use: No   Sexual activity: Yes   Other Topics Concern   Not on file  Social History Narrative   Not on file   Social Determinants of Health   Financial Resource Strain: Not on file  Food Insecurity: Not on file  Transportation Needs: Not on file  Physical Activity: Not on file  Stress: Not on file  Social Connections: Not on file  Intimate Partner Violence: Not on file    Vital Signs: Blood pressure 133/89, pulse 78, temperature 97.8 F (36.6 C), resp. rate 16, height '5\' 5"'$  (1.651 m), weight (!) 353 lb (160.1 kg), SpO2 98 %.  Examination: General Appearance: The patient is well-developed, well-nourished, and in no distress. Skin: Gross inspection of skin unremarkable. Head: normocephalic, no gross deformities. Eyes: no gross deformities noted. ENT: ears appear grossly normal no exudates. Neck: Supple. No thyromegaly. No LAD. Respiratory: few rhonchi. Cardiovascular: Normal S1 and S2 without murmur or rub. Extremities: No cyanosis. pulses are equal. Neurologic: Alert and oriented. No involuntary movements.  LABS: Recent Results (from the past 2160 hour(s))  CBC with Differential/Platelet     Status: None   Collection Time: 05/10/22  2:09 PM  Result Value Ref Range   WBC 5.2 3.8 - 10.8 Thousand/uL   RBC 4.36 3.80 - 5.10 Million/uL   Hemoglobin 12.9 11.7 - 15.5 g/dL   HCT 38.5 35.0 - 45.0 %   MCV 88.3 80.0 - 100.0 fL   MCH 29.6 27.0 - 33.0 pg   MCHC 33.5 32.0 - 36.0 g/dL   RDW 12.3 11.0 - 15.0 %   Platelets 226 140 - 400 Thousand/uL   MPV 9.4 7.5 - 12.5 fL   Neutro Abs 2,652 1,500 - 7,800 cells/uL   Lymphs Abs 1,893 850 - 3,900 cells/uL   Absolute Monocytes 442 200 - 950 cells/uL   Eosinophils Absolute 192 15 - 500 cells/uL   Basophils Absolute 21 0 - 200 cells/uL   Neutrophils Relative % 51 %   Total Lymphocyte 36.4 %  Monocytes Relative 8.5 %   Eosinophils Relative 3.7 %   Basophils Relative 0.4 %  COMPLETE METABOLIC PANEL WITH GFR     Status: None   Collection Time: 05/10/22  2:09 PM   Result Value Ref Range   Glucose, Bld 85 65 - 99 mg/dL    Comment: .            Fasting reference interval .    BUN 20 7 - 25 mg/dL   Creat 0.75 0.50 - 1.05 mg/dL   eGFR 91 > OR = 60 mL/min/1.29m   BUN/Creatinine Ratio SEE NOTE: 6 - 22 (calc)    Comment:    Not Reported: BUN and Creatinine are within    reference range. .    Sodium 142 135 - 146 mmol/L   Potassium 3.7 3.5 - 5.3 mmol/L   Chloride 109 98 - 110 mmol/L   CO2 21 20 - 32 mmol/L   Calcium 9.4 8.6 - 10.4 mg/dL   Total Protein 6.7 6.1 - 8.1 g/dL   Albumin 4.2 3.6 - 5.1 g/dL   Globulin 2.5 1.9 - 3.7 g/dL (calc)   AG Ratio 1.7 1.0 - 2.5 (calc)   Total Bilirubin 0.3 0.2 - 1.2 mg/dL   Alkaline phosphatase (APISO) 92 37 - 153 U/L   AST 13 10 - 35 U/L   ALT 9 6 - 29 U/L  Urinalysis, Complete     Status: Abnormal   Collection Time: 05/23/22 10:36 AM  Result Value Ref Range   Specific Gravity, UA 1.030 1.005 - 1.030   pH, UA 6.0 5.0 - 7.5   Color, UA Yellow Yellow   Appearance Ur Clear Clear   Leukocytes,UA Negative Negative   Protein,UA Trace (A) Negative/Trace   Glucose, UA Negative Negative   Ketones, UA Negative Negative   RBC, UA Negative Negative   Bilirubin, UA Negative Negative   Urobilinogen, Ur 0.2 0.2 - 1.0 mg/dL   Nitrite, UA Negative Negative   Microscopic Examination See below:   Microscopic Examination     Status: Abnormal   Collection Time: 05/23/22 10:36 AM   Urine  Result Value Ref Range   WBC, UA 0-5 0 - 5 /hpf   RBC, Urine 0-2 0 - 2 /hpf   Epithelial Cells (non renal) 0-10 0 - 10 /hpf   Mucus, UA Present (A) Not Estab.   Bacteria, UA Few None seen/Few  UA/M w/rflx Culture, Routine     Status: Abnormal   Collection Time: 06/09/22  2:36 PM   Specimen: Urine   Urine  Result Value Ref Range   Specific Gravity, UA 1.025 1.005 - 1.030   pH, UA 6.0 5.0 - 7.5   Color, UA Yellow Yellow   Appearance Ur Clear Clear   Leukocytes,UA Negative Negative   Protein,UA Negative Negative/Trace    Glucose, UA Negative Negative   Ketones, UA Trace (A) Negative   RBC, UA Negative Negative   Bilirubin, UA Negative Negative   Urobilinogen, Ur 0.2 0.2 - 1.0 mg/dL   Nitrite, UA Negative Negative   Microscopic Examination Comment     Comment: Microscopic follows if indicated.   Microscopic Examination See below:     Comment: Microscopic was indicated and was performed.   Urinalysis Reflex Comment     Comment: This specimen will not reflex to a Urine Culture.  Microscopic Examination     Status: Abnormal   Collection Time: 06/09/22  2:36 PM   Urine  Result Value Ref Range  WBC, UA 0-5 0 - 5 /hpf   RBC, Urine 0-2 0 - 2 /hpf   Epithelial Cells (non renal) 0-10 0 - 10 /hpf   Casts None seen None seen /lpf   Crystals Present (A) N/A   Crystal Type Calcium Oxalate N/A   Bacteria, UA None seen None seen/Few  Vitamin B12     Status: None   Collection Time: 07/12/22  1:05 PM  Result Value Ref Range   Vitamin B-12 424 180 - 914 pg/mL    Comment: (NOTE) This assay is not validated for testing neonatal or myeloproliferative syndrome specimens for Vitamin B12 levels. Performed at [facility], [place] [address] [place], [place] [address]   Ferritin     Status: None   Collection Time: 07/12/22  1:05 PM  Result Value Ref Range   Ferritin 28 11 - 307 ng/mL    Comment: Performed at [facility], [address] [place], [place] [address]  CBC with Differential     Status: None   Collection Time: 07/12/22  1:05 PM  Result Value Ref Range   WBC 7.5 4.0 - 10.5 K/uL   RBC 4.32 3.87 - 5.11 MIL/uL   Hemoglobin 12.6 12.0 - 15.0 g/dL   HCT 38.7 36.0 - 46.0 %   MCV 89.6 80.0 - 100.0 fL   MCH 29.2 26.0 - 34.0 pg   MCHC 32.6 30.0 - 36.0 g/dL   RDW 14.7 11.5 - 15.5 %   Platelets 214 150 - 400 K/uL   nRBC 0.0 0.0 - 0.2 %   Neutrophils Relative % 59 %   Neutro Abs 4.4 1.7 - 7.7 K/uL   Lymphocytes Relative 28 %   Lymphs Abs 2.1 0.7 - 4.0 K/uL   Monocytes Relative 10 %    Monocytes Absolute 0.8 0.1 - 1.0 K/uL   Eosinophils Relative 2 %   Eosinophils Absolute 0.2 0.0 - 0.5 K/uL   Basophils Relative 1 %   Basophils Absolute 0.0 0.0 - 0.1 K/uL   Immature Granulocytes 0 %   Abs Immature Granulocytes 0.02 0.00 - 0.07 K/uL    Comment: Performed at [facility], [address] [place], [place] [address]  Iron and TIBC     Status: None   Collection Time: 07/12/22  1:05 PM  Result Value Ref Range   Iron 82 28 - 170 ug/dL   TIBC 444 250 - 450 ug/dL   Saturation Ratios 19 10.4 - 31.8 %   UIBC 362 ug/dL    Comment: Performed at [facility], [address] [place], [place] [address]  CBC with Differential     Status: Abnormal   Collection Time: 08/03/22  2:38 PM  Result Value Ref Range   WBC 6.2 4.0 - 10.5 K/uL   RBC 3.98 3.87 - 5.11 MIL/uL   Hemoglobin 11.8 (L) 12.0 - 15.0 g/dL   HCT 36.9 36.0 - 46.0 %   MCV 92.7 80.0 - 100.0 fL   MCH 29.6 26.0 - 34.0 pg   MCHC 32.0 30.0 - 36.0 g/dL   RDW 15.3 11.5 - 15.5 %   Platelets 223 150 - 400 K/uL   nRBC 0.0 0.0 - 0.2 %   Neutrophils Relative % 79 %   Neutro Abs 5.0 1.7 - 7.7 K/uL   Lymphocytes Relative 12 %   Lymphs Abs 0.7 0.7 - 4.0 K/uL   Monocytes Relative 4 %   Monocytes Absolute 0.2 0.1 - 1.0 K/uL  Eosinophils Relative 3 %   Eosinophils Absolute 0.2 0.0 - 0.5 K/uL   Basophils Relative 1 %   Basophils Absolute 0.0 0.0 - 0.1 K/uL   Immature Granulocytes 1 %   Abs Immature Granulocytes 0.03 0.00 - 0.07 K/uL    Comment: Performed at [facility], [address] [place], [place] [address]  Basic metabolic panel     Status: Abnormal   Collection Time: 08/03/22  2:38 PM  Result Value Ref Range   Sodium 141 135 - 145 mmol/L   Potassium 3.6 3.5 - 5.1 mmol/L   Chloride 110 98 - 111 mmol/L   CO2 21 (L) 22 - 32 mmol/L   Glucose, Bld 126 (H) 70 - 99 mg/dL    Comment: Glucose reference range applies only to samples taken after fasting for at least 8 hours.   BUN 17 8 - 23 mg/dL    Creatinine, Ser 0.81 0.44 - 1.00 mg/dL   Calcium 8.8 (L) 8.9 - 10.3 mg/dL   GFR, Estimated >60 >60 mL/min    Comment: (NOTE) Calculated using the CKD-EPI Creatinine Equation (2021)    Anion gap 10 5 - 15    Comment: Performed at [facility], [address] [place], [place] [address]    Radiology: CT Soft Tissue Neck W Contrast  Result Date: 08/03/2022 CLINICAL DATA:  Palate weakness, stridor versus wheezing for 1 month, question upper airway etiology EXAM: CT NECK WITH CONTRAST TECHNIQUE: Multidetector CT imaging of the neck was performed using the standard protocol following the bolus administration of intravenous contrast. RADIATION DOSE REDUCTION: This exam was performed according to the departmental dose-optimization program which includes automated exposure control, adjustment of the mA and/or kV according to patient size and/or use of iterative reconstruction technique. CONTRAST:  40m OMNIPAQUE IOHEXOL 350 MG/ML SOLN COMPARISON:  02/28/2016 CTA neck FINDINGS: Pharynx and larynx: Normal. No mass or swelling. Salivary glands: No inflammation, mass, or stone. Thyroid: Normal. Lymph nodes: None enlarged or abnormal density. Vascular: Patent. Limited intracranial: Negative. Visualized orbits: Negative. Mastoids and visualized paranasal sinuses: Clear. Skeleton: No acute osseous abnormality. Upper chest: No focal pulmonary opacity or pleural effusion. Other: None. IMPRESSION: No acute abnormality in the neck. No etiology is seen for the patient's reported palate weakness or wheezing. Electronically Signed   By: AMerilyn BabaM.D.   On: 08/03/2022 19:49   CT Angio Chest PE W and/or Wo Contrast  Result Date: 08/03/2022 CLINICAL DATA:  History of prior COVID infection with persistent cough and wheezing, initial encounter EXAM: CT ANGIOGRAPHY CHEST WITH CONTRAST TECHNIQUE: Multidetector CT imaging of the chest was performed using the standard protocol during bolus administration of  intravenous contrast. Multiplanar CT image reconstructions and MIPs were obtained to evaluate the vascular anatomy. RADIATION DOSE REDUCTION: This exam was performed according to the departmental dose-optimization program which includes automated exposure control, adjustment of the mA and/or kV according to patient size and/or use of iterative reconstruction technique. CONTRAST:  836mOMNIPAQUE IOHEXOL 350 MG/ML SOLN COMPARISON:  Chest x-ray from earlier in the same day. FINDINGS: Cardiovascular: Ascending aorta is mildly dilated 4.3 cm. No evidence of dissection is seen. No cardiac enlargement is noted. Mitral annular calcifications are seen. The pulmonary artery shows a normal branching pattern bilaterally. No filling defect to suggest pulmonary embolism is seen. Mediastinum/Nodes: Thoracic inlet is within normal limits. No hilar or mediastinal adenopathy is noted. The esophagus as visualized is within normal limits. Lungs/Pleura: Lungs are well aerated bilaterally. No focal infiltrate or  effusion is seen. No parenchymal nodules are seen. Upper Abdomen: Postsurgical changes about the stomach are noted. The remainder of the upper abdomen is within normal limits. Musculoskeletal: Degenerative changes of the thoracic spine are noted. No acute rib abnormality is seen. Review of the MIP images confirms the above findings. IMPRESSION: No evidence of pulmonary emboli. Dilatation of the ascending aorta to 4.3 cm. Recommend annual imaging followup by CTA or MRA. This recommendation follows 2010 ACCF/AHA/AATS/ACR/ASA/SCA/SCAI/SIR/STS/SVM Guidelines for the Diagnosis and Management of Patients with Thoracic Aortic Disease. Circulation. 2010; 121: D974-B638. Aortic aneurysm NOS (ICD10-I71.9) Electronically Signed   By: Inez Catalina M.D.   On: 08/03/2022 19:32   DG Chest 2 View  Result Date: 08/03/2022 CLINICAL DATA:  Cough and wheezing EXAM: CHEST - 2 VIEW COMPARISON:  Chest 07/07/2022 FINDINGS: Heart size upper normal.  Vascularity normal. Negative for heart failure. Lungs clear without infiltrate effusion Chronic compression fracture lower thoracic spine unchanged. IMPRESSION: No active cardiopulmonary disease. Electronically Signed   By: Franchot Gallo M.D.   On: 08/03/2022 15:09    CT Soft Tissue Neck W Contrast  Result Date: 08/03/2022 CLINICAL DATA:  Palate weakness, stridor versus wheezing for 1 month, question upper airway etiology EXAM: CT NECK WITH CONTRAST TECHNIQUE: Multidetector CT imaging of the neck was performed using the standard protocol following the bolus administration of intravenous contrast. RADIATION DOSE REDUCTION: This exam was performed according to the departmental dose-optimization program which includes automated exposure control, adjustment of the mA and/or kV according to patient size and/or use of iterative reconstruction technique. CONTRAST:  109m OMNIPAQUE IOHEXOL 350 MG/ML SOLN COMPARISON:  02/28/2016 CTA neck FINDINGS: Pharynx and larynx: Normal. No mass or swelling. Salivary glands: No inflammation, mass, or stone. Thyroid: Normal. Lymph nodes: None enlarged or abnormal density. Vascular: Patent. Limited intracranial: Negative. Visualized orbits: Negative. Mastoids and visualized paranasal sinuses: Clear. Skeleton: No acute osseous abnormality. Upper chest: No focal pulmonary opacity or pleural effusion. Other: None. IMPRESSION: No acute abnormality in the neck. No etiology is seen for the patient's reported palate weakness or wheezing. Electronically Signed   By: AMerilyn BabaM.D.   On: 08/03/2022 19:49   CT Angio Chest PE W and/or Wo Contrast  Result Date: 08/03/2022 CLINICAL DATA:  History of prior COVID infection with persistent cough and wheezing, initial encounter EXAM: CT ANGIOGRAPHY CHEST WITH CONTRAST TECHNIQUE: Multidetector CT imaging of the chest was performed using the standard protocol during bolus administration of intravenous contrast. Multiplanar CT image reconstructions  and MIPs were obtained to evaluate the vascular anatomy. RADIATION DOSE REDUCTION: This exam was performed according to the departmental dose-optimization program which includes automated exposure control, adjustment of the mA and/or kV according to patient size and/or use of iterative reconstruction technique. CONTRAST:  871mOMNIPAQUE IOHEXOL 350 MG/ML SOLN COMPARISON:  Chest x-ray from earlier in the same day. FINDINGS: Cardiovascular: Ascending aorta is mildly dilated 4.3 cm. No evidence of dissection is seen. No cardiac enlargement is noted. Mitral annular calcifications are seen. The pulmonary artery shows a normal branching pattern bilaterally. No filling defect to suggest pulmonary embolism is seen. Mediastinum/Nodes: Thoracic inlet is within normal limits. No hilar or mediastinal adenopathy is noted. The esophagus as visualized is within normal limits. Lungs/Pleura: Lungs are well aerated bilaterally. No focal infiltrate or effusion is seen. No parenchymal nodules are seen. Upper Abdomen: Postsurgical changes about the stomach are noted. The remainder of the upper abdomen is within normal limits. Musculoskeletal: Degenerative changes of the thoracic spine are noted. No  acute rib abnormality is seen. Review of the MIP images confirms the above findings. IMPRESSION: No evidence of pulmonary emboli. Dilatation of the ascending aorta to 4.3 cm. Recommend annual imaging followup by CTA or MRA. This recommendation follows 2010 ACCF/AHA/AATS/ACR/ASA/SCA/SCAI/SIR/STS/SVM Guidelines for the Diagnosis and Management of Patients with Thoracic Aortic Disease. Circulation. 2010; 121: G387-F643. Aortic aneurysm NOS (ICD10-I71.9) Electronically Signed   By: Inez Catalina M.D.   On: 08/03/2022 19:32   DG Chest 2 View  Result Date: 08/03/2022 CLINICAL DATA:  Cough and wheezing EXAM: CHEST - 2 VIEW COMPARISON:  Chest 07/07/2022 FINDINGS: Heart size upper normal. Vascularity normal. Negative for heart failure. Lungs clear  without infiltrate effusion Chronic compression fracture lower thoracic spine unchanged. IMPRESSION: No active cardiopulmonary disease. Electronically Signed   By: Franchot Gallo M.D.   On: 08/03/2022 15:09    CT Soft Tissue Neck W Contrast  Result Date: 08/03/2022 CLINICAL DATA:  Palate weakness, stridor versus wheezing for 1 month, question upper airway etiology EXAM: CT NECK WITH CONTRAST TECHNIQUE: Multidetector CT imaging of the neck was performed using the standard protocol following the bolus administration of intravenous contrast. RADIATION DOSE REDUCTION: This exam was performed according to the departmental dose-optimization program which includes automated exposure control, adjustment of the mA and/or kV according to patient size and/or use of iterative reconstruction technique. CONTRAST:  23m OMNIPAQUE IOHEXOL 350 MG/ML SOLN COMPARISON:  02/28/2016 CTA neck FINDINGS: Pharynx and larynx: Normal. No mass or swelling. Salivary glands: No inflammation, mass, or stone. Thyroid: Normal. Lymph nodes: None enlarged or abnormal density. Vascular: Patent. Limited intracranial: Negative. Visualized orbits: Negative. Mastoids and visualized paranasal sinuses: Clear. Skeleton: No acute osseous abnormality. Upper chest: No focal pulmonary opacity or pleural effusion. Other: None. IMPRESSION: No acute abnormality in the neck. No etiology is seen for the patient's reported palate weakness or wheezing. Electronically Signed   By: AMerilyn BabaM.D.   On: 08/03/2022 19:49   CT Angio Chest PE W and/or Wo Contrast  Result Date: 08/03/2022 CLINICAL DATA:  History of prior COVID infection with persistent cough and wheezing, initial encounter EXAM: CT ANGIOGRAPHY CHEST WITH CONTRAST TECHNIQUE: Multidetector CT imaging of the chest was performed using the standard protocol during bolus administration of intravenous contrast. Multiplanar CT image reconstructions and MIPs were obtained to evaluate the vascular anatomy.  RADIATION DOSE REDUCTION: This exam was performed according to the departmental dose-optimization program which includes automated exposure control, adjustment of the mA and/or kV according to patient size and/or use of iterative reconstruction technique. CONTRAST:  846mOMNIPAQUE IOHEXOL 350 MG/ML SOLN COMPARISON:  Chest x-ray from earlier in the same day. FINDINGS: Cardiovascular: Ascending aorta is mildly dilated 4.3 cm. No evidence of dissection is seen. No cardiac enlargement is noted. Mitral annular calcifications are seen. The pulmonary artery shows a normal branching pattern bilaterally. No filling defect to suggest pulmonary embolism is seen. Mediastinum/Nodes: Thoracic inlet is within normal limits. No hilar or mediastinal adenopathy is noted. The esophagus as visualized is within normal limits. Lungs/Pleura: Lungs are well aerated bilaterally. No focal infiltrate or effusion is seen. No parenchymal nodules are seen. Upper Abdomen: Postsurgical changes about the stomach are noted. The remainder of the upper abdomen is within normal limits. Musculoskeletal: Degenerative changes of the thoracic spine are noted. No acute rib abnormality is seen. Review of the MIP images confirms the above findings. IMPRESSION: No evidence of pulmonary emboli. Dilatation of the ascending aorta to 4.3 cm. Recommend annual imaging followup by CTA or MRA. This  recommendation follows 2010 ACCF/AHA/AATS/ACR/ASA/SCA/SCAI/SIR/STS/SVM Guidelines for the Diagnosis and Management of Patients with Thoracic Aortic Disease. Circulation. 2010; 121: O841-Y606. Aortic aneurysm NOS (ICD10-I71.9) Electronically Signed   By: Inez Catalina M.D.   On: 08/03/2022 19:32   DG Chest 2 View  Result Date: 08/03/2022 CLINICAL DATA:  Cough and wheezing EXAM: CHEST - 2 VIEW COMPARISON:  Chest 07/07/2022 FINDINGS: Heart size upper normal. Vascularity normal. Negative for heart failure. Lungs clear without infiltrate effusion Chronic compression fracture  lower thoracic spine unchanged. IMPRESSION: No active cardiopulmonary disease. Electronically Signed   By: Franchot Gallo M.D.   On: 08/03/2022 15:09      Assessment and Plan: Patient Active Problem List   Diagnosis Date Noted   Moderate persistent asthma with acute exacerbation 08/03/2022   Chronic knee pain (1ry area of Pain) (Left) 04/19/2022   Tricompartment osteoarthritis of knee (Left) 04/19/2022   Primary osteoarthritis of knee (Left) 04/19/2022   Chronic anticoagulation (Plaquenil) 04/19/2022   History of attempted suicide (07/10/2016) 04/18/2022   History of drug overdose (11/08/2015) 04/18/2022   Chronic pain syndrome 04/17/2022   Pharmacologic therapy 04/17/2022   Disorder of skeletal system 04/17/2022   Problems influencing health status 04/17/2022   Neck pain, musculoskeletal 03/24/2020   Bipolar affective disorder, current episode mixed ([place]) 11/09/2019   Encounter for general adult medical examination with abnormal findings 06/15/2019   Closed fracture of elbow (Left) 06/15/2019   Urinary tract infection without hematuria 06/15/2019   Encounter for long-term (current) use of medications 06/15/2019   Acute upper respiratory infection 06/06/2019   Exposure to COVID-19 virus 06/06/2019   BMI 50.0-59.9, adult ([place]) 06/03/2019   Exercise-induced asthma 12/15/2018   Screening for breast cancer 05/29/2018   Duodenal ulcer disease 05/29/2018   Screening for malignant neoplasm of cervix 05/29/2018   Dysuria 05/29/2018   Surgical wound dehiscence, initial encounter 04/14/2018   Postoperative abdominal hernia with obstruction    Incarcerated ventral hernia 03/22/2018   SBO (small bowel obstruction) ([place]) 03/07/2018   Persistent umbilical sinus 30/16/0109   Atopic dermatitis 12/05/2017   Adjustment disorder with mixed anxiety and depressed mood 03/10/2017   Major depressive disorder 03/10/2017   Primary osteoarthritis of knees (Bilateral) 01/19/2017   Suicide attempt ([place])  07/10/2016   Fibromyalgia 07/10/2016   High risk medication use 07/10/2016   Hypotension 11/09/2015   Respiratory failure ([place])    Drug overdose (11/08/2015) (undetermined intent) 11/08/2015   Fatty infiltration of liver 02/16/2015   Hepatic fibrosis 02/16/2015   Iron deficiency anemia 02/05/2015   Vitamin B 12 deficiency 02/05/2015   OP (osteoporosis) 06/16/2014   Rheumatoid arteritis ([place]) 03/02/2014   Hypothyroidism 03/02/2014   Rheumatic fever without heart involvement 03/02/2014   Adult hypothyroidism 03/02/2014   Arthritis of pelvic region, degenerative 03/02/2014   History of bariatric surgery 11/24/2013    Class: History of   Bipolar affective disorder ([place]) 11/24/2013   Rheumatoid arthritis ([place]) 11/24/2013   Polysubstance (excluding opioids) dependence ([place]) 09/11/2013   Polysubstance dependence ([place]) 09/11/2013   Combined drug dependence excluding opioids ([place]) 09/11/2013   Arthritis or polyarthritis, rheumatoid ([place]) 09/05/2013   Bipolar 1 disorder, depressed ([place]) 09/05/2013    1. Post-COVID chronic cough Tussionex as needed  2. Obesity, morbid ([place]) ***  3. Ground glass opacity present on imaging of lung ***  4. Shortness of breath *** - Budeson-Glycopyrrol-Formoterol (BREZTRI AEROSPHERE) 160-9-4.8 MCG/ACT AERO; Inhale 2 puffs into the lungs 2 (two) times daily.  Dispense: 10.7 g; Refill: 11 - Pulmonary function test;  Future   General Counseling: I have discussed the findings of the evaluation and examination with Helene Kelp.  I have also discussed any further diagnostic evaluation thatmay be needed or ordered today. Kianah verbalizes understanding of the findings of todays visit. We also reviewed her medications today and discussed drug interactions and side effects including but not limited excessive drowsiness and altered mental states. We also discussed that there is always a risk not just to her but also people around her. she has been encouraged to call the  office with any questions or concerns that should arise related to todays visit.  No orders of the defined types were placed in this encounter.    Time spent: 55  I have personally obtained a history, examined the patient, evaluated laboratory and imaging results, formulated the assessment and plan and placed orders.    Allyne Gee, MD [facility]monary and Critical [facility] Sleep medicine

## 2022-08-08 NOTE — Progress Notes (Signed)
Office Visit Note  Patient: Jamie Bennett             Date of Birth: [birth year]           MRN: [id number]             PCP: Lavera Guise, MD Referring: Lavera Guise, MD Visit Date: 08/22/2022 Occupation: '@GUAROCC'$ @  Subjective:  Stiffness in both hands   History of Present Illness: Jamie Bennett is a 62 y.o. female with history of seropositive rheumatoid arthritis, osteoarthritis, osteoporosis, DDD.  Patient remains on Plaquenil 200 mg 1 tablet by mouth twice daily.  She is tolerating Plaquenil without any side effects and has not missed any doses recently.  She denies any recent rheumatoid arthritis flares.  She experiences stiffness in both hands first thing in the mornings or if she is performing crafts for long periods of time.  She denies any joint swelling at this time.  She denies any muscle cramping or spasms in her hands.  Patient reports that she is started to have recurrence of left knee joint pain.  She had a nerve block in the left knee performed at pain management in August 2023 which initially provided significant relief but she is starting to have a recurrence of discomfort especially when climbing steps.  She continues to use a cane to assist with ambulation.  She plans on following back up with pain management.   Activities of Daily Living:  Patient reports morning stiffness for 1-1.5 hours.   Patient Denies nocturnal pain.  Difficulty dressing/grooming: Denies Difficulty climbing stairs: Reports Difficulty getting out of chair: Reports Difficulty using hands for taps, buttons, cutlery, and/or writing: Denies  Review of Systems  Constitutional:  Positive for fatigue.  HENT:  Positive for mouth dryness. Negative for mouth sores.   Eyes:  Negative for dryness.  Respiratory:  Positive for cough, shortness of breath and wheezing.   Cardiovascular:  Negative for chest pain and palpitations.  Gastrointestinal:  Positive for constipation. Negative for blood in  stool and diarrhea.  Endocrine: Negative for increased urination.  Genitourinary:  Negative for involuntary urination.  Musculoskeletal:  Positive for joint pain, gait problem, joint pain, joint swelling, myalgias, muscle weakness, morning stiffness, muscle tenderness and myalgias.  Skin:  Negative for color change, rash, hair loss and sensitivity to sunlight.  Allergic/Immunologic: Positive for susceptible to infections.  Neurological:  Positive for tremors and headaches. Negative for dizziness.  Hematological:  Negative for swollen glands.  Psychiatric/Behavioral:  Negative for depressed mood and sleep disturbance. The patient is not nervous/anxious.     PMFS History:  Patient Active Problem List   Diagnosis Date Noted   Moderate persistent asthma with acute exacerbation 08/03/2022   Chronic knee pain (1ry area of Pain) (Left) 04/19/2022   Tricompartment osteoarthritis of knee (Left) 04/19/2022   Primary osteoarthritis of knee (Left) 04/19/2022   Chronic anticoagulation (Plaquenil) 04/19/2022   History of attempted suicide (07/10/2016) 04/18/2022   History of drug overdose (11/08/2015) 04/18/2022   Chronic pain syndrome 04/17/2022   Pharmacologic therapy 04/17/2022   Disorder of skeletal system 04/17/2022   Problems influencing health status 04/17/2022   Neck pain, musculoskeletal 03/24/2020   Bipolar affective disorder, current episode mixed (Jamie Bennett) 11/09/2019   Encounter for general adult medical examination with abnormal findings 06/15/2019   Closed fracture of elbow (Left) 06/15/2019   Urinary tract infection without hematuria 06/15/2019   Encounter for long-term (current) use of medications 06/15/2019   Acute upper respiratory  infection 06/06/2019   Exposure to COVID-19 virus 06/06/2019   BMI 50.0-59.9, adult (Jamie Bennett) 06/03/2019   Exercise-induced asthma 12/15/2018   Screening for breast cancer 05/29/2018   Duodenal ulcer disease 05/29/2018   Screening for malignant neoplasm of  cervix 05/29/2018   Dysuria 05/29/2018   Surgical wound dehiscence, initial encounter 04/14/2018   Postoperative abdominal hernia with obstruction    Incarcerated ventral hernia 03/22/2018   SBO (small bowel obstruction) (Jamie Bennett) 03/07/2018   Persistent umbilical sinus 13/02/6577   Atopic dermatitis 12/05/2017   Adjustment disorder with mixed anxiety and depressed mood 03/10/2017   Major depressive disorder 03/10/2017   Primary osteoarthritis of knees (Bilateral) 01/19/2017   Suicide attempt (Jamie Bennett) 07/10/2016   Fibromyalgia 07/10/2016   High risk medication use 07/10/2016   Hypotension 11/09/2015   Respiratory failure (Jamie Bennett)    Drug overdose (11/08/2015) (undetermined intent) 11/08/2015   Fatty infiltration of liver 02/16/2015   Hepatic fibrosis 02/16/2015   Iron deficiency anemia 02/05/2015   Vitamin B 12 deficiency 02/05/2015   OP (osteoporosis) 06/16/2014   Rheumatoid arteritis ([place]) 03/02/2014   Hypothyroidism 03/02/2014   Rheumatic fever without heart involvement 03/02/2014   Adult hypothyroidism 03/02/2014   Arthritis of pelvic region, degenerative 03/02/2014   History of bariatric surgery 11/24/2013    Class: History of   Bipolar affective disorder (Jamie Bennett) 11/24/2013   Rheumatoid arthritis (Jamie Bennett) 11/24/2013   Polysubstance (excluding opioids) dependence (Jamie Bennett) 09/11/2013   Polysubstance dependence (Jamie Bennett) 09/11/2013   Combined drug dependence excluding opioids (Jamie Bennett) 09/11/2013   Arthritis or polyarthritis, rheumatoid (Jamie Bennett) 09/05/2013   Bipolar 1 disorder, depressed (Jamie Bennett) 09/05/2013    Past Medical History:  Diagnosis Date   Anemia    Anxiety    Bipolar 1 disorder (HCC)    Collagen vascular disease (Jamie Bennett)    rhematoid arthritis   Constipation    Dyspnea    Fibromyalgia    GERD (gastroesophageal reflux disease)    Heart murmur    mild-asymptomatic   Hepatic steatosis    History of kidney stones    History of Roux-en-Y gastric bypass    Hypotension    Hypothyroidism     Morbid obesity with BMI of 50.0-59.9, adult (HCC)    Opioid abuse ([name])    Osteoarthritis    Osteoporosis    PONV (postoperative nausea and vomiting)    nausea only   Rheumatic fever    Rheumatoid arthritis ([place])    Small bowel obstruction ([place])    Thyroid disease     Family History  Problem Relation Age of Onset   Depression Mother    Dementia Mother    Heart disease Mother    Osteoporosis Mother    Parkinson's disease Father    Liver disease Brother    Colon cancer Neg Hx    Past Surgical History:  Procedure Laterality Date   CHOLECYSTECTOMY  2004   COLONOSCOPY N/A 07/16/2017   Procedure: COLONOSCOPY;  Surgeon: Manya Silvas, MD;  Location: [facility] ENDOSCOPY;  Service: Endoscopy;  Laterality: N/A;   EXPLORATORY LAPAROTOMY  2009   GASTRIC BYPASS  2003   [facility]   HEEL SPUR EXCISION Bilateral    KNEE ARTHROSCOPY WITH SUBCHONDROPLASTY Left 09/27/2021   Procedure: Left knee subchondroplasty medial tibial plateau;  Surgeon: Hessie Knows, MD;  Location: [facility] ORS;  Service: Orthopedics;  Laterality: Left;   LAPAROTOMY N/A 03/01/2018   Procedure: EXPLORATORY LAPAROTOMY/ UMBILECTOMY;  Surgeon: Robert Bellow, MD;  Location: [facility] ORS;  Service: General;  Laterality: N/A;  TONSILLECTOMY     VENTRAL HERNIA REPAIR N/A 03/07/2018   Procedure: HERNIA REPAIR VENTRAL ADULT;  Surgeon: Robert Bellow, MD;  Location: [facility] ORS;  Service: General;  Laterality: N/A;   Social History   Social History Narrative   Not on file   Immunization History  Administered Date(s) Administered   Influenza Inj Mdck Quad Pf 06/09/2022   Influenza, Quadrivalent, Recombinant, Inj, Pf 04/26/2019   Influenza,inj,Quad PF,6+ Mos 04/24/2016   Influenza-Unspecified 04/19/2021   Moderna SARS-COV2 Booster Vaccination 01/30/2021, 05/13/2021   Moderna Sars-Covid-2 Vaccination 10/25/2019, 11/22/2019, 06/02/2020   Pneumococcal Polysaccharide-23 03/02/2018     Objective: Vital Signs: BP 119/78  (BP Location: Left Arm, Patient Position: Sitting, Cuff Size: Large)   Pulse 67   Resp 18   Ht '5\' 5"'$  (1.651 m)   Wt (!) 356 lb 3.2 oz (161.6 kg)   BMI 59.27 kg/m    Physical Exam Vitals and nursing note reviewed.  Constitutional:      Appearance: She is well-developed.  HENT:     Head: Normocephalic and atraumatic.  Eyes:     Conjunctiva/sclera: Conjunctivae normal.  Cardiovascular:     Rate and Rhythm: Normal rate and regular rhythm.     Heart sounds: Murmur heard.  Pulmonary:     Effort: Pulmonary effort is normal.     Breath sounds: Normal breath sounds.  Abdominal:     General: Bowel sounds are normal.     Palpations: Abdomen is soft.  Musculoskeletal:     Cervical back: Normal range of motion.  Skin:    General: Skin is warm and dry.     Capillary Refill: Capillary refill takes less than 2 seconds.  Neurological:     Mental Status: She is alert and oriented to person, place, and time.  Psychiatric:        Behavior: Behavior normal.      Musculoskeletal Exam: Patient remained seated during the examination today.  C-spine has limited range of motion with lateral rotation.  Trapezius muscle tension and tenderness bilaterally.  Thoracic kyphosis noted.  Shoulder joints have good range of motion with no discomfort.  Elbow joints have good range of motion with no tenderness or synovitis.  Both wrist joints have good range of motion with no tenderness or synovitis.  No tenderness or synovitis over MCP joints.  Hip joints difficult to assess in seated position.  Warmth in left knee. Crepitus in both knees.  Ankle joints have good ROM with no joint tenderness.   CDAI Exam: CDAI Score: -- Patient Global: 2 mm; Provider Global: 2 mm Swollen: --; Tender: -- Joint Exam 08/22/2022   No joint exam has been documented for this visit   There is currently no information documented on the homunculus. Go to the Rheumatology activity and complete the homunculus joint  exam.  Investigation: No additional findings.  Imaging: CT Soft Tissue Neck W Contrast  Result Date: 08/03/2022 CLINICAL DATA:  Palate weakness, stridor versus wheezing for 1 month, question upper airway etiology EXAM: CT NECK WITH CONTRAST TECHNIQUE: Multidetector CT imaging of the neck was performed using the standard protocol following the bolus administration of intravenous contrast. RADIATION DOSE REDUCTION: This exam was performed according to the departmental dose-optimization program which includes automated exposure control, adjustment of the mA and/or kV according to patient size and/or use of iterative reconstruction technique. CONTRAST:  42m OMNIPAQUE IOHEXOL 350 MG/ML SOLN COMPARISON:  02/28/2016 CTA neck FINDINGS: Pharynx and larynx: Normal. No mass or swelling. Salivary glands: No  inflammation, mass, or stone. Thyroid: Normal. Lymph nodes: None enlarged or abnormal density. Vascular: Patent. Limited intracranial: Negative. Visualized orbits: Negative. Mastoids and visualized paranasal sinuses: Clear. Skeleton: No acute osseous abnormality. Upper chest: No focal pulmonary opacity or pleural effusion. Other: None. IMPRESSION: No acute abnormality in the neck. No etiology is seen for the patient's reported palate weakness or wheezing. Electronically Signed   By: Merilyn Baba M.D.   On: 08/03/2022 19:49   CT Angio Chest PE W and/or Wo Contrast  Result Date: 08/03/2022 CLINICAL DATA:  History of prior COVID infection with persistent cough and wheezing, initial encounter EXAM: CT ANGIOGRAPHY CHEST WITH CONTRAST TECHNIQUE: Multidetector CT imaging of the chest was performed using the standard protocol during bolus administration of intravenous contrast. Multiplanar CT image reconstructions and MIPs were obtained to evaluate the vascular anatomy. RADIATION DOSE REDUCTION: This exam was performed according to the departmental dose-optimization program which includes automated exposure control,  adjustment of the mA and/or kV according to patient size and/or use of iterative reconstruction technique. CONTRAST:  41m OMNIPAQUE IOHEXOL 350 MG/ML SOLN COMPARISON:  Chest x-ray from earlier in the same day. FINDINGS: Cardiovascular: Ascending aorta is mildly dilated 4.3 cm. No evidence of dissection is seen. No cardiac enlargement is noted. Mitral annular calcifications are seen. The pulmonary artery shows a normal branching pattern bilaterally. No filling defect to suggest pulmonary embolism is seen. Mediastinum/Nodes: Thoracic inlet is within normal limits. No hilar or mediastinal adenopathy is noted. The esophagus as visualized is within normal limits. Lungs/Pleura: Lungs are well aerated bilaterally. No focal infiltrate or effusion is seen. No parenchymal nodules are seen. Upper Abdomen: Postsurgical changes about the stomach are noted. The remainder of the upper abdomen is within normal limits. Musculoskeletal: Degenerative changes of the thoracic spine are noted. No acute rib abnormality is seen. Review of the MIP images confirms the above findings. IMPRESSION: No evidence of pulmonary emboli. Dilatation of the ascending aorta to 4.3 cm. Recommend annual imaging followup by CTA or MRA. This recommendation follows 2010 ACCF/AHA/AATS/ACR/ASA/SCA/SCAI/SIR/STS/SVM Guidelines for the Diagnosis and Management of Patients with Thoracic Aortic Disease. Circulation. 2010; 121:: W431-V400 Aortic aneurysm NOS (ICD10-I71.9) Electronically Signed   By: MInez CatalinaM.D.   On: 08/03/2022 19:32   DG Chest 2 View  Result Date: 08/03/2022 CLINICAL DATA:  Cough and wheezing EXAM: CHEST - 2 VIEW COMPARISON:  Chest 07/07/2022 FINDINGS: Heart size upper normal. Vascularity normal. Negative for heart failure. Lungs clear without infiltrate effusion Chronic compression fracture lower thoracic spine unchanged. IMPRESSION: No active cardiopulmonary disease. Electronically Signed   By: CFranchot GalloM.D.   On: 08/03/2022 15:09     Recent Labs: Lab Results  Component Value Date   WBC 6.2 08/03/2022   HGB 11.8 (L) 08/03/2022   PLT 223 08/03/2022   NA 141 08/03/2022   K 3.6 08/03/2022   CL 110 08/03/2022   CO2 21 (L) 08/03/2022   GLUCOSE 126 (H) 08/03/2022   BUN 17 08/03/2022   CREATININE 0.81 08/03/2022   BILITOT 0.3 05/10/2022   ALKPHOS 105 01/10/2022   AST 13 05/10/2022   ALT 9 05/10/2022   PROT 6.7 05/10/2022   ALBUMIN 3.9 01/10/2022   CALCIUM 8.8 (L) 08/03/2022   GFRAA 99 05/26/2020    Speciality Comments: PLQ Eye Exam: 04/24/2022 WNL @ [facility]   Procedures:  No procedures performed Allergies: Lactose intolerance (gi) and Sulfa antibiotics    Assessment / Plan:     Visit Diagnoses: Rheumatoid arthritis involving multiple  sites with positive rheumatoid factor ([place]) - She has no synovitis on examination today.  She has not any signs or symptoms of a rheumatoid arthritis flare.  She has clinically been doing well taking Plaquenil 200 mg 1 tablet by mouth twice daily.  She is tolerating Plaquenil without any side effects.  She has not missed any doses of Plaquenil recently.  She continues to experience intermittent stiffness in both hands especially first thing in the morning and while crafting.  She has not noticed any inflammation.  She continues to follow-up closely with pain management.  She will remain on Plaquenil as prescribed.  She was advised to notify us if she develops signs or symptoms of a flare.  She will follow-up in the office in 5 months or sooner if needed.  A refill of Plaquenil sent to the pharmacy today.  Plan: hydroxychloroquine (PLAQUENIL) 200 MG tablet  High risk medication use - Plaquenil 200 mg 1 tablet by mouth twice daily.   PLQ Eye Exam: 04/24/2022 WNL @ [facility].  CBC and BMP drawn on 08/03/2022.  She will be having updated lab work this month prior to having a scheduled Prolia injection.  Primary osteoarthritis of both hands: She has PIP and DIP  thickening consistent with osteoarthritis of both hands.  She has complete fist formation bilaterally.  No synovitis noted.  Discussed the importance of joint protection and muscle strengthening.  She was encouraged to perform hand exercises daily.  She has been coughing on a regular basis which helps with her fine motor skills.  Trochanteric bursitis of left hip: Intermittent discomfort.   Primary osteoarthritis of both knees - S/p orthovisc bilateral knees 08/2020-09/2020. She underwent a left knee subchondroplasty of the medial tibial plateau on 09/27/21 performed by Dr. Rudene Christians.  She had a nerve block performed in August 2023, which initially alleviated her discomfort.  She has started to have intermittent discomfort in the left knee joint especially when climbing steps.  She has been using a cane to assist with ambulation.  She plans on following back up with pain management.  DDD (degenerative disc disease), cervical: Limited ROM with lateral rotation.  Trapezius muscle tension and tenderness bilaterally.    DDD (degenerative disc disease), thoracic: Thoracic kyphosis noted.  No midline spinal tenderness.   DDD (degenerative disc disease), lumbar: Chronic pain.   Other osteoporosis without current pathological fracture - She is taking a calcium and vitamin D supplement daily.  She is scheduled for an upcoming Prolia injection on 08/29/2022 ordered by her endocrinologist. She continues to use a cane to assist with ambulation.  She has not had any recent falls.  Fibromyalgia -She continues to experience intermittent myalgias and muscle tenderness due to fibromyalgia.  Her symptoms are typically exacerbated by weather changes.  She continues to follow-up with pain management.  She is currently on gabapentin, effexor, trazodone, and tizanidine.  Trapezius muscle spasm: She has some trapezius muscle tension and tenderness bilaterally.  Other medical conditions are listed as follows:  History of  gastroesophageal reflux (GERD)  History of anemia  History of bipolar disorder  History of hypothyroidism  Orders: No orders of the defined types were placed in this encounter.  Meds ordered this encounter  Medications   hydroxychloroquine (PLAQUENIL) 200 MG tablet    Sig: Take 1 tablet (200 mg total) by mouth 2 (two) times daily.    Dispense:  180 tablet    Refill:  0   Follow-Up Instructions: Return in about 5  months (around 01/21/2023) for Rheumatoid arthritis, Osteoarthritis, Fibromyalgia.   Ofilia Neas, PA-C  Note - This record has been created using Dragon software.  Chart creation errors have been sought, but may not always  have been located. Such creation errors do not reflect on  the standard of medical care.

## 2022-08-09 ENCOUNTER — Ambulatory Visit (INDEPENDENT_AMBULATORY_CARE_PROVIDER_SITE_OTHER): Payer: Medicare HMO | Admitting: Internal Medicine

## 2022-08-09 DIAGNOSIS — R0602 Shortness of breath: Secondary | ICD-10-CM | POA: Diagnosis not present

## 2022-08-14 ENCOUNTER — Inpatient Hospital Stay: Payer: Medicare HMO

## 2022-08-17 ENCOUNTER — Inpatient Hospital Stay: Payer: Medicare HMO | Attending: Oncology

## 2022-08-17 ENCOUNTER — Other Ambulatory Visit: Payer: Self-pay | Admitting: Oncology

## 2022-08-17 VITALS — BP 149/61 | HR 88 | Temp 97.8°F | Resp 20

## 2022-08-17 DIAGNOSIS — D519 Vitamin B12 deficiency anemia, unspecified: Secondary | ICD-10-CM | POA: Diagnosis not present

## 2022-08-17 DIAGNOSIS — E538 Deficiency of other specified B group vitamins: Secondary | ICD-10-CM

## 2022-08-17 DIAGNOSIS — D509 Iron deficiency anemia, unspecified: Secondary | ICD-10-CM | POA: Diagnosis not present

## 2022-08-17 MED ORDER — SODIUM CHLORIDE 0.9 % IV SOLN
INTRAVENOUS | Status: DC
  Filled 2022-08-17: qty 250

## 2022-08-17 MED ORDER — SODIUM CHLORIDE 0.9 % IV SOLN: 510.0000 mg | INTRAVENOUS | Status: DC

## 2022-08-17 MED ORDER — CYANOCOBALAMIN 1000 MCG/ML IJ SOLN
1000.0000 ug | Freq: Once | INTRAMUSCULAR | Status: AC
  Administered 2022-08-17: 1000 ug via INTRAMUSCULAR
  Filled 2022-08-17: qty 1

## 2022-08-17 MED ORDER — SODIUM CHLORIDE 0.9 % IV SOLN
200.0000 mg | Freq: Once | INTRAVENOUS | Status: AC
  Administered 2022-08-17: 200 mg via INTRAVENOUS
  Filled 2022-08-17: qty 200

## 2022-08-17 NOTE — Patient Instructions (Signed)
[facility] AT [place]  Discharge Instructions: Thank you for choosing [place] [facility] to provide your oncology and hematology care.  If you have a lab appointment with the [facility], please go directly to the [facility] and check in at the registration area.  Wear comfortable clothing and clothing appropriate for easy access to any Portacath or PICC line.   We strive to give you quality time with your provider. You may need to reschedule your appointment if you arrive late (15 or more minutes).  Arriving late affects you and other patients whose appointments are after yours.  Also, if you miss three or more appointments without notifying the office, you may be dismissed from the clinic at the provider's discretion.      For prescription refill requests, have your pharmacy contact our office and allow 72 hours for refills to be completed.    Today you received the following chemotherapy and/or immunotherapy agents B12 and Venofer.      To help prevent nausea and vomiting after your treatment, we encourage you to take your nausea medication as directed.  BELOW ARE SYMPTOMS THAT SHOULD BE REPORTED IMMEDIATELY: *FEVER GREATER THAN 100.4 F (38 C) OR HIGHER *CHILLS OR SWEATING *NAUSEA AND VOMITING THAT IS NOT CONTROLLED WITH YOUR NAUSEA MEDICATION *UNUSUAL SHORTNESS OF BREATH *UNUSUAL BRUISING OR BLEEDING *URINARY PROBLEMS (pain or burning when urinating, or frequent urination) *BOWEL PROBLEMS (unusual diarrhea, constipation, pain near the anus) TENDERNESS IN MOUTH AND THROAT WITH OR WITHOUT PRESENCE OF ULCERS (sore throat, sores in mouth, or a toothache) UNUSUAL RASH, SWELLING OR PAIN  UNUSUAL VAGINAL DISCHARGE OR ITCHING   Items with * indicate a potential emergency and should be followed up as soon as possible or go to the Emergency Department if any problems should occur.  Please show the CHEMOTHERAPY ALERT CARD or IMMUNOTHERAPY ALERT CARD at  check-in to the Emergency Department and triage nurse.  Should you have questions after your visit or need to cancel or reschedule your appointment, please contact [facility]CER [place] AT [place]  [phone] and follow the prompts.  Office hours are 8:00 a.m. to 4:30 p.m. Monday - Friday. Please note that voicemails left after 4:00 p.m. may not be returned until the following business day.  We are closed weekends and major holidays. You have access to a nurse at all times for urgent questions. Please call the main number to the clinic [phone] and follow the prompts.  For any non-urgent questions, you may also contact your provider using MyChart. We now offer e-Visits for anyone 34 and older to request care online for non-urgent symptoms. For details visit [email or web address].   Also download the MyChart app! Go to the app store, search "MyChart", open the app, select [place], and log in with your MyChart username and password.

## 2022-08-19 ENCOUNTER — Other Ambulatory Visit (HOSPITAL_COMMUNITY): Payer: Self-pay | Admitting: Psychiatry

## 2022-08-19 ENCOUNTER — Other Ambulatory Visit: Payer: Self-pay | Admitting: Oncology

## 2022-08-21 ENCOUNTER — Other Ambulatory Visit: Payer: Self-pay | Admitting: Nurse Practitioner

## 2022-08-21 ENCOUNTER — Encounter: Payer: Self-pay | Admitting: Internal Medicine

## 2022-08-21 ENCOUNTER — Ambulatory Visit (INDEPENDENT_AMBULATORY_CARE_PROVIDER_SITE_OTHER): Payer: Medicare HMO | Admitting: Internal Medicine

## 2022-08-21 VITALS — BP 147/82 | HR 75 | Temp 97.9°F | Resp 16 | Ht 65.0 in | Wt 356.2 lb

## 2022-08-21 DIAGNOSIS — J22 Unspecified acute lower respiratory infection: Secondary | ICD-10-CM

## 2022-08-21 DIAGNOSIS — U099 Post covid-19 condition, unspecified: Secondary | ICD-10-CM

## 2022-08-21 DIAGNOSIS — R053 Chronic cough: Secondary | ICD-10-CM

## 2022-08-21 DIAGNOSIS — J4541 Moderate persistent asthma with (acute) exacerbation: Secondary | ICD-10-CM

## 2022-08-21 MED ORDER — HYDROCOD POLI-CHLORPHE POLI ER 10-8 MG/5ML PO SUER: 5.0000 mL | Freq: Two times a day (BID) | ORAL | 0 refills | Status: DC | PRN

## 2022-08-21 NOTE — Progress Notes (Signed)
[facility] [place], [place] [address]  Pulmonary Sleep Medicine   Office Visit Note  Patient Name: Jamie Bennett DOB: 02/04/1961 MRN [id number]  Date of Service: 08/21/2022  Complaints/HPI: She states her breathing is a little better. She is still using her cough syrup. States if she misses it she does have some cough. PFT done which show mild RLD likely from obesity as a main contributing factor.  Spoke with her regarding her weight she weighs 356 pounds definitely needs to work on weight reduction at this stage.  As far as medications are concerned she is on albuterol which should be continued she is also on Breztri which should be continued  ROS  General: (-) fever, (-) chills, (-) night sweats, (-) weakness Skin: (-) rashes, (-) itching,. Eyes: (-) visual changes, (-) redness, (-) itching. Nose and Sinuses: (-) nasal stuffiness or itchiness, (-) postnasal drip, (-) nosebleeds, (-) sinus trouble. Mouth and Throat: (-) sore throat, (-) hoarseness. Neck: (-) swollen glands, (-) enlarged thyroid, (-) neck pain. Respiratory: + cough, (-) bloody sputum, + shortness of breath, - wheezing. Cardiovascular: + ankle swelling, (-) chest pain. Lymphatic: (-) lymph node enlargement. Neurologic: (-) numbness, (-) tingling. Psychiatric: (-) anxiety, (-) depression   Current Medication: Outpatient Encounter Medications as of 08/21/2022  Medication Sig Note   acetaminophen (TYLENOL) 500 MG tablet Take 500-1,000 mg by mouth every 6 (six) hours as needed (pain.).    albuterol (VENTOLIN HFA) 108 (90 Base) MCG/ACT inhaler Inhale 2 puffs into the lungs every 6 (six) hours as needed for wheezing or shortness of breath.    benzonatate (TESSALON) 100 MG capsule Take 1-2 tablets 3 times a day as needed for cough    Budeson-Glycopyrrol-Formoterol (BREZTRI AEROSPHERE) 160-9-4.8 MCG/ACT AERO Inhale 2 puffs into the lungs 2 (two) times daily.    busPIRone (BUSPAR) 30 MG  tablet 1 bid    Calcium Carbonate (CALCIUM 600 PO) Take 600 mg by mouth in the morning and at bedtime.    chlorpheniramine-HYDROcodone (TUSSIONEX) 10-8 MG/5ML Take 5 mLs by mouth every 12 (twelve) hours as needed for cough.    Cholecalciferol 125 MCG (5000 UT) TABS Take 5,000 Units by mouth in the morning.    clonazePAM (KLONOPIN) 0.5 MG tablet TAKE 1 TABLET BY MOUTH ONCE DAILY AS NEEDED FOR  ACUTE  ANXIETY. Pt may take 1 extra tablet (0.5 mg) PRN as needed for severe anxiety.    Coenzyme Q10 300 MG CAPS Take 300 mg by mouth in the morning.    cyanocobalamin (,VITAMIN B-12,) 1000 MCG/ML injection Inject 1,000 mcg into the muscle every 30 (thirty) days.     denosumab (PROLIA) 60 MG/ML SOSY injection Inject 60 mg into the skin every 6 (six) months. 09/01/2021: Last dose: 07/2021   desipramine (NORPRAMIN) 25 MG tablet Take 1 tablet (25 mg total) by mouth daily.    esomeprazole (NEXIUM) 20 MG capsule Take 20 mg by mouth in the morning.    estradiol (ESTRACE) 0.1 MG/GM vaginal cream Apply a pea-sized amount to index finger and wipe in vaginal introitus twice weekly    hydroxychloroquine (PLAQUENIL) 200 MG tablet Take 1 tablet (200 mg total) by mouth 2 (two) times daily.    hydrOXYzine (ATARAX) 50 MG tablet Take 1 tablet (50 mg total) by mouth 3 (three) times daily as needed for anxiety.    ipratropium-albuterol (DUONEB) 0.5-2.5 (3) MG/3ML SOLN Take 3 mLs by nebulization every 4 (four) hours as needed.    Krill Oil  500 MG CAPS Take 500 mg by mouth daily at 12 noon.    lamoTRIgine (LAMICTAL) 150 MG tablet Take 1 tablet (150 mg total) by mouth 2 (two) times daily.    levothyroxine (SYNTHROID) 112 MCG tablet Take 1 tablet (112 mcg total) by mouth daily before breakfast.    loratadine (CLARITIN) 10 MG tablet Take 10 mg by mouth daily as needed for allergies.    magnesium oxide (MAG-OX) 400 MG tablet Take 400 mg by mouth every evening.    Multiple Vitamin (MULTIVITAMIN) tablet Take 1 tablet by mouth every  evening.    mupirocin ointment (BACTROBAN) 2 % Apply 1 Application topically 2 (two) times daily.    Nebulizers (COMPRESSOR/NEBULIZER) MISC Dispense 1 machine with associated mask and tubing    nystatin (MYCOSTATIN/NYSTOP) powder Apply 1 Application topically 2 (two) times daily.    polyethylene glycol (MIRALAX / GLYCOLAX) 17 g packet Take 17 g by mouth in the morning and at bedtime.    psyllium (METAMUCIL) 58.6 % packet Take 1 packet by mouth in the morning.    sucralfate (CARAFATE) 1 g tablet Take 1 tablet (1 g total) by mouth 2 (two) times daily.    tiZANidine (ZANAFLEX) 4 MG tablet Take 1 tablet (4 mg total) by mouth 3 (three) times daily.    traZODone (DESYREL) 100 MG tablet Take 1 tablet (100 mg total) by mouth at bedtime as needed and may repeat dose one time if needed for sleep.    venlafaxine (EFFEXOR) 100 MG tablet Take 1 tablet (100 mg total) by mouth 3 (three) times daily with meals.    vitamin C (ASCORBIC ACID) 500 MG tablet Take 500 mg by mouth in the morning.    zinc gluconate 50 MG tablet Take 50 mg by mouth in the morning.    [DISCONTINUED] amoxicillin-clavulanate (AUGMENTIN) 875-125 MG tablet Take 1 tablet by mouth every 12 (twelve) hours.    [DISCONTINUED] azithromycin (ZITHROMAX) 250 MG tablet Take 1 tablet (250 mg total) by mouth daily. Take first 2 tablets together, then 1 every day until finished.    No facility-administered encounter medications on file as of 08/21/2022.    Surgical History: Past Surgical History:  Procedure Laterality Date   CHOLECYSTECTOMY  2004   COLONOSCOPY N/A 07/16/2017   Procedure: COLONOSCOPY;  Surgeon: Manya Silvas, MD;  Location: [facility] ENDOSCOPY;  Service: Endoscopy;  Laterality: N/A;   EXPLORATORY LAPAROTOMY  2009   GASTRIC BYPASS  2003   [facility]   HEEL SPUR EXCISION Bilateral    KNEE ARTHROSCOPY WITH SUBCHONDROPLASTY Left 09/27/2021   Procedure: Left knee subchondroplasty medial tibial plateau;  Surgeon: Hessie Knows, MD;   Location: [facility] ORS;  Service: Orthopedics;  Laterality: Left;   LAPAROTOMY N/A 03/01/2018   Procedure: EXPLORATORY LAPAROTOMY/ UMBILECTOMY;  Surgeon: Robert Bellow, MD;  Location: [facility] ORS;  Service: General;  Laterality: N/A;   TONSILLECTOMY     VENTRAL HERNIA REPAIR N/A 03/07/2018   Procedure: HERNIA REPAIR VENTRAL ADULT;  Surgeon: Robert Bellow, MD;  Location: [facility] ORS;  Service: General;  Laterality: N/A;    Medical History: Past Medical History:  Diagnosis Date   Anemia    Anxiety    Bipolar 1 disorder ([place])    Collagen vascular disease ([place])    rhematoid arthritis   Constipation    Dyspnea    Fibromyalgia    GERD (gastroesophageal reflux disease)    Heart murmur    mild-asymptomatic   Hepatic steatosis    History of  kidney stones    History of Roux-en-Y gastric bypass    Hypotension    Hypothyroidism    Morbid obesity with BMI of 50.0-59.9, adult (HCC)    Opioid abuse (HCC)    Osteoarthritis    Osteoporosis    PONV (postoperative nausea and vomiting)    nausea only   Rheumatic fever    Rheumatoid arthritis (HCC)    Small bowel obstruction (HCC)    Thyroid disease     Family History: Family History  Problem Relation Age of Onset   Depression Mother    Dementia Mother    Heart disease Mother    Osteoporosis Mother    Parkinson's disease Father    Liver disease Brother    Colon cancer Neg Hx     Social History: Social History   Socioeconomic History   Marital status: Married    Spouse name: Not on file   Number of children: Not on file   Years of education: Not on file   Highest education level: Not on file  Occupational History   Not on file  Tobacco Use   Smoking status: Never    Passive exposure: Past   Smokeless tobacco: Never  Vaping Use   Vaping Use: Never used  Substance and Sexual Activity   Alcohol use: Not Currently   Drug use: No   Sexual activity: Yes  Other Topics Concern   Not on file  Social History Narrative   Not  on file   Social Determinants of Health   Financial Resource Strain: Not on file  Food Insecurity: Not on file  Transportation Needs: Not on file  Physical Activity: Not on file  Stress: Not on file  Social Connections: Not on file  Intimate Partner Violence: Not on file    Vital Signs: Blood pressure (!) 147/82, pulse 75, temperature 97.9 F (36.6 C), resp. rate 16, height '5\' 5"'$  (1.651 m), weight (!) 356 lb 3.2 oz (161.6 kg), SpO2 98 %.  Examination: General Appearance: The patient is well-developed, well-nourished, and in no distress. Skin: Gross inspection of skin unremarkable. Head: normocephalic, no gross deformities. Eyes: no gross deformities noted. ENT: ears appear grossly normal no exudates. Neck: Supple. No thyromegaly. No LAD. Respiratory: no rhonchi noted. Cardiovascular: Normal S1 and S2 without murmur or rub. Extremities: No cyanosis. pulses are equal. Neurologic: Alert and oriented. No involuntary movements.  LABS: Recent Results (from the past 2160 hour(s))  UA/M w/rflx Culture, Routine     Status: Abnormal   Collection Time: 06/09/22  2:36 PM   Specimen: Urine   Urine  Result Value Ref Range   Specific Gravity, UA 1.025 1.005 - 1.030   pH, UA 6.0 5.0 - 7.5   Color, UA Yellow Yellow   Appearance Ur Clear Clear   Leukocytes,UA Negative Negative   Protein,UA Negative Negative/Trace   Glucose, UA Negative Negative   Ketones, UA Trace (A) Negative   RBC, UA Negative Negative   Bilirubin, UA Negative Negative   Urobilinogen, Ur 0.2 0.2 - 1.0 mg/dL   Nitrite, UA Negative Negative   Microscopic Examination Comment     Comment: Microscopic follows if indicated.   Microscopic Examination See below:     Comment: Microscopic was indicated and was performed.   Urinalysis Reflex Comment     Comment: This specimen will not reflex to a Urine Culture.  Microscopic Examination     Status: Abnormal   Collection Time: 06/09/22  2:36 PM   Urine  Result  Value Ref  Range   WBC, UA 0-5 0 - 5 /hpf   RBC, Urine 0-2 0 - 2 /hpf   Epithelial Cells (non renal) 0-10 0 - 10 /hpf   Casts None seen None seen /lpf   Crystals Present (A) N/A   Crystal Type Calcium Oxalate N/A   Bacteria, UA None seen None seen/Few  Vitamin B12     Status: None   Collection Time: 07/12/22  1:05 PM  Result Value Ref Range   Vitamin B-12 424 180 - 914 pg/mL    Comment: (NOTE) This assay is not validated for testing neonatal or myeloproliferative syndrome specimens for Vitamin B12 levels. Performed at [facility], [place] [address], [place], [place] [address]   Ferritin     Status: None   Collection Time: 07/12/22  1:05 PM  Result Value Ref Range   Ferritin 28 11 - 307 ng/mL    Comment: Performed at [facility], [address] [place], [place] [address]  CBC with Differential     Status: None   Collection Time: 07/12/22  1:05 PM  Result Value Ref Range   WBC 7.5 4.0 - 10.5 K/uL   RBC 4.32 3.87 - 5.11 MIL/uL   Hemoglobin 12.6 12.0 - 15.0 g/dL   HCT 38.7 36.0 - 46.0 %   MCV 89.6 80.0 - 100.0 fL   MCH 29.2 26.0 - 34.0 pg   MCHC 32.6 30.0 - 36.0 g/dL   RDW 14.7 11.5 - 15.5 %   Platelets 214 150 - 400 K/uL   nRBC 0.0 0.0 - 0.2 %   Neutrophils Relative % 59 %   Neutro Abs 4.4 1.7 - 7.7 K/uL   Lymphocytes Relative 28 %   Lymphs Abs 2.1 0.7 - 4.0 K/uL   Monocytes Relative 10 %   Monocytes Absolute 0.8 0.1 - 1.0 K/uL   Eosinophils Relative 2 %   Eosinophils Absolute 0.2 0.0 - 0.5 K/uL   Basophils Relative 1 %   Basophils Absolute 0.0 0.0 - 0.1 K/uL   Immature Granulocytes 0 %   Abs Immature Granulocytes 0.02 0.00 - 0.07 K/uL    Comment: Performed at [facility], [address] [place], [place] [address]  Iron and TIBC     Status: None   Collection Time: 07/12/22  1:05 PM  Result Value Ref Range   Iron 82 28 - 170 ug/dL   TIBC 444 250 - 450 ug/dL   Saturation Ratios 19 10.4 - 31.8 %   UIBC 362 ug/dL    Comment: Performed at [facility], [address] [place], [place] [address]  CBC with Differential     Status: Abnormal   Collection Time: 08/03/22  2:38 PM  Result Value Ref Range   WBC 6.2 4.0 - 10.5 K/uL   RBC 3.98 3.87 - 5.11 MIL/uL   Hemoglobin 11.8 (L) 12.0 - 15.0 g/dL   HCT 36.9 36.0 - 46.0 %   MCV 92.7 80.0 - 100.0 fL   MCH 29.6 26.0 - 34.0 pg   MCHC 32.0 30.0 - 36.0 g/dL   RDW 15.3 11.5 - 15.5 %   Platelets 223 150 - 400 K/uL   nRBC 0.0 0.0 - 0.2 %   Neutrophils Relative % 79 %   Neutro Abs 5.0 1.7 - 7.7 K/uL   Lymphocytes Relative 12 %   Lymphs Abs 0.7 0.7 - 4.0 K/uL   Monocytes Relative 4 %   Monocytes Absolute 0.2  0.1 - 1.0 K/uL   Eosinophils Relative 3 %   Eosinophils Absolute 0.2 0.0 - 0.5 K/uL   Basophils Relative 1 %   Basophils Absolute 0.0 0.0 - 0.1 K/uL   Immature Granulocytes 1 %   Abs Immature Granulocytes 0.03 0.00 - 0.07 K/uL    Comment: Performed at [facility], [address], [place], [place] [address]  Basic metabolic panel     Status: Abnormal   Collection Time: 08/03/22  2:38 PM  Result Value Ref Range   Sodium 141 135 - 145 mmol/L   Potassium 3.6 3.5 - 5.1 mmol/L   Chloride 110 98 - 111 mmol/L   CO2 21 (L) 22 - 32 mmol/L   Glucose, Bld 126 (H) 70 - 99 mg/dL    Comment: Glucose reference range applies only to samples taken after fasting for at least 8 hours.   BUN 17 8 - 23 mg/dL   Creatinine, Ser 0.81 0.44 - 1.00 mg/dL   Calcium 8.8 (L) 8.9 - 10.3 mg/dL   GFR, Estimated >60 >60 mL/min    Comment: (NOTE) Calculated using the CKD-EPI Creatinine Equation (2021)    Anion gap 10 5 - 15    Comment: Performed at [facility], [address] [place], [place] [address]    Radiology: CT Soft Tissue Neck W Contrast  Result Date: 08/03/2022 CLINICAL DATA:  Palate weakness, stridor versus wheezing for 1 month, question upper airway etiology EXAM: CT NECK WITH CONTRAST TECHNIQUE: Multidetector CT imaging of the neck was performed using the standard  protocol following the bolus administration of intravenous contrast. RADIATION DOSE REDUCTION: This exam was performed according to the departmental dose-optimization program which includes automated exposure control, adjustment of the mA and/or kV according to patient size and/or use of iterative reconstruction technique. CONTRAST:  6m OMNIPAQUE IOHEXOL 350 MG/ML SOLN COMPARISON:  02/28/2016 CTA neck FINDINGS: Pharynx and larynx: Normal. No mass or swelling. Salivary glands: No inflammation, mass, or stone. Thyroid: Normal. Lymph nodes: None enlarged or abnormal density. Vascular: Patent. Limited intracranial: Negative. Visualized orbits: Negative. Mastoids and visualized paranasal sinuses: Clear. Skeleton: No acute osseous abnormality. Upper chest: No focal pulmonary opacity or pleural effusion. Other: None. IMPRESSION: No acute abnormality in the neck. No etiology is seen for the patient's reported palate weakness or wheezing. Electronically Signed   By: AMerilyn BabaM.D.   On: 08/03/2022 19:49   CT Angio Chest PE W and/or Wo Contrast  Result Date: 08/03/2022 CLINICAL DATA:  History of prior COVID infection with persistent cough and wheezing, initial encounter EXAM: CT ANGIOGRAPHY CHEST WITH CONTRAST TECHNIQUE: Multidetector CT imaging of the chest was performed using the standard protocol during bolus administration of intravenous contrast. Multiplanar CT image reconstructions and MIPs were obtained to evaluate the vascular anatomy. RADIATION DOSE REDUCTION: This exam was performed according to the departmental dose-optimization program which includes automated exposure control, adjustment of the mA and/or kV according to patient size and/or use of iterative reconstruction technique. CONTRAST:  893mOMNIPAQUE IOHEXOL 350 MG/ML SOLN COMPARISON:  Chest x-ray from earlier in the same day. FINDINGS: Cardiovascular: Ascending aorta is mildly dilated 4.3 cm. No evidence of dissection is seen. No cardiac  enlargement is noted. Mitral annular calcifications are seen. The pulmonary artery shows a normal branching pattern bilaterally. No filling defect to suggest pulmonary embolism is seen. Mediastinum/Nodes: Thoracic inlet is within normal limits. No hilar or mediastinal adenopathy is noted. The esophagus as visualized is within normal limits. Lungs/Pleura: Lungs are well  aerated bilaterally. No focal infiltrate or effusion is seen. No parenchymal nodules are seen. Upper Abdomen: Postsurgical changes about the stomach are noted. The remainder of the upper abdomen is within normal limits. Musculoskeletal: Degenerative changes of the thoracic spine are noted. No acute rib abnormality is seen. Review of the MIP images confirms the above findings. IMPRESSION: No evidence of pulmonary emboli. Dilatation of the ascending aorta to 4.3 cm. Recommend annual imaging followup by CTA or MRA. This recommendation follows 2010 ACCF/AHA/AATS/ACR/ASA/SCA/SCAI/SIR/STS/SVM Guidelines for the Diagnosis and Management of Patients with Thoracic Aortic Disease. Circulation. 2010; 121: T614-E315. Aortic aneurysm NOS (ICD10-I71.9) Electronically Signed   By: Inez Catalina M.D.   On: 08/03/2022 19:32   DG Chest 2 View  Result Date: 08/03/2022 CLINICAL DATA:  Cough and wheezing EXAM: CHEST - 2 VIEW COMPARISON:  Chest 07/07/2022 FINDINGS: Heart size upper normal. Vascularity normal. Negative for heart failure. Lungs clear without infiltrate effusion Chronic compression fracture lower thoracic spine unchanged. IMPRESSION: No active cardiopulmonary disease. Electronically Signed   By: Franchot Gallo M.D.   On: 08/03/2022 15:09    No results found.  CT Soft Tissue Neck W Contrast  Result Date: 08/03/2022 CLINICAL DATA:  Palate weakness, stridor versus wheezing for 1 month, question upper airway etiology EXAM: CT NECK WITH CONTRAST TECHNIQUE: Multidetector CT imaging of the neck was performed using the standard protocol following the bolus  administration of intravenous contrast. RADIATION DOSE REDUCTION: This exam was performed according to the departmental dose-optimization program which includes automated exposure control, adjustment of the mA and/or kV according to patient size and/or use of iterative reconstruction technique. CONTRAST:  18m OMNIPAQUE IOHEXOL 350 MG/ML SOLN COMPARISON:  02/28/2016 CTA neck FINDINGS: Pharynx and larynx: Normal. No mass or swelling. Salivary glands: No inflammation, mass, or stone. Thyroid: Normal. Lymph nodes: None enlarged or abnormal density. Vascular: Patent. Limited intracranial: Negative. Visualized orbits: Negative. Mastoids and visualized paranasal sinuses: Clear. Skeleton: No acute osseous abnormality. Upper chest: No focal pulmonary opacity or pleural effusion. Other: None. IMPRESSION: No acute abnormality in the neck. No etiology is seen for the patient's reported palate weakness or wheezing. Electronically Signed   By: AMerilyn BabaM.D.   On: 08/03/2022 19:49   CT Angio Chest PE W and/or Wo Contrast  Result Date: 08/03/2022 CLINICAL DATA:  History of prior COVID infection with persistent cough and wheezing, initial encounter EXAM: CT ANGIOGRAPHY CHEST WITH CONTRAST TECHNIQUE: Multidetector CT imaging of the chest was performed using the standard protocol during bolus administration of intravenous contrast. Multiplanar CT image reconstructions and MIPs were obtained to evaluate the vascular anatomy. RADIATION DOSE REDUCTION: This exam was performed according to the departmental dose-optimization program which includes automated exposure control, adjustment of the mA and/or kV according to patient size and/or use of iterative reconstruction technique. CONTRAST:  887mOMNIPAQUE IOHEXOL 350 MG/ML SOLN COMPARISON:  Chest x-ray from earlier in the same day. FINDINGS: Cardiovascular: Ascending aorta is mildly dilated 4.3 cm. No evidence of dissection is seen. No cardiac enlargement is noted. Mitral annular  calcifications are seen. The pulmonary artery shows a normal branching pattern bilaterally. No filling defect to suggest pulmonary embolism is seen. Mediastinum/Nodes: Thoracic inlet is within normal limits. No hilar or mediastinal adenopathy is noted. The esophagus as visualized is within normal limits. Lungs/Pleura: Lungs are well aerated bilaterally. No focal infiltrate or effusion is seen. No parenchymal nodules are seen. Upper Abdomen: Postsurgical changes about the stomach are noted. The remainder of the upper abdomen is within normal limits.  Musculoskeletal: Degenerative changes of the thoracic spine are noted. No acute rib abnormality is seen. Review of the MIP images confirms the above findings. IMPRESSION: No evidence of pulmonary emboli. Dilatation of the ascending aorta to 4.3 cm. Recommend annual imaging followup by CTA or MRA. This recommendation follows 2010 ACCF/AHA/AATS/ACR/ASA/SCA/SCAI/SIR/STS/SVM Guidelines for the Diagnosis and Management of Patients with Thoracic Aortic Disease. Circulation. 2010; 121: Z610-R604. Aortic aneurysm NOS (ICD10-I71.9) Electronically Signed   By: Inez Catalina M.D.   On: 08/03/2022 19:32   DG Chest 2 View  Result Date: 08/03/2022 CLINICAL DATA:  Cough and wheezing EXAM: CHEST - 2 VIEW COMPARISON:  Chest 07/07/2022 FINDINGS: Heart size upper normal. Vascularity normal. Negative for heart failure. Lungs clear without infiltrate effusion Chronic compression fracture lower thoracic spine unchanged. IMPRESSION: No active cardiopulmonary disease. Electronically Signed   By: Franchot Gallo M.D.   On: 08/03/2022 15:09      Assessment and Plan: Patient Active Problem List   Diagnosis Date Noted   Moderate persistent asthma with acute exacerbation 08/03/2022   Chronic knee pain (1ry area of Pain) (Left) 04/19/2022   Tricompartment osteoarthritis of knee (Left) 04/19/2022   Primary osteoarthritis of knee (Left) 04/19/2022   Chronic anticoagulation (Plaquenil)  04/19/2022   History of attempted suicide (07/10/2016) 04/18/2022   History of drug overdose (11/08/2015) 04/18/2022   Chronic pain syndrome 04/17/2022   Pharmacologic therapy 04/17/2022   Disorder of skeletal system 04/17/2022   Problems influencing health status 04/17/2022   Neck pain, musculoskeletal 03/24/2020   Bipolar affective disorder, current episode mixed ([place]) 11/09/2019   Encounter for general adult medical examination with abnormal findings 06/15/2019   Closed fracture of elbow (Left) 06/15/2019   Urinary tract infection without hematuria 06/15/2019   Encounter for long-term (current) use of medications 06/15/2019   Acute upper respiratory infection 06/06/2019   Exposure to COVID-19 virus 06/06/2019   BMI 50.0-59.9, adult ([place]) 06/03/2019   Exercise-induced asthma 12/15/2018   Screening for breast cancer 05/29/2018   Duodenal ulcer disease 05/29/2018   Screening for malignant neoplasm of cervix 05/29/2018   Dysuria 05/29/2018   Surgical wound dehiscence, initial encounter 04/14/2018   Postoperative abdominal hernia with obstruction    Incarcerated ventral hernia 03/22/2018   SBO (small bowel obstruction) ([place]) 03/07/2018   Persistent umbilical sinus 54/03/8118   Atopic dermatitis 12/05/2017   Adjustment disorder with mixed anxiety and depressed mood 03/10/2017   Major depressive disorder 03/10/2017   Primary osteoarthritis of knees (Bilateral) 01/19/2017   Suicide attempt ([place]) 07/10/2016   Fibromyalgia 07/10/2016   High risk medication use 07/10/2016   Hypotension 11/09/2015   Respiratory failure ([place])    Drug overdose (11/08/2015) (undetermined intent) 11/08/2015   Fatty infiltration of liver 02/16/2015   Hepatic fibrosis 02/16/2015   Iron deficiency anemia 02/05/2015   Vitamin B 12 deficiency 02/05/2015   OP (osteoporosis) 06/16/2014   Rheumatoid arteritis ([place]) 03/02/2014   Hypothyroidism 03/02/2014   Rheumatic fever without heart involvement 03/02/2014    Adult hypothyroidism 03/02/2014   Arthritis of pelvic region, degenerative 03/02/2014   History of bariatric surgery 11/24/2013    Class: History of   Bipolar affective disorder ([place]) 11/24/2013   Rheumatoid arthritis ([place]) 11/24/2013   Polysubstance (excluding opioids) dependence ([place]) 09/11/2013   Polysubstance dependence ([place]) 09/11/2013   Combined drug dependence excluding opioids ([place]) 09/11/2013   Arthritis or polyarthritis, rheumatoid ([place]) 09/05/2013   Bipolar 1 disorder, depressed ([place]) 09/05/2013    1. Post-COVID chronic cough Slowly improving I suspect that over  time this should improve  2. Obesity, morbid ([place]) Work on diet exercise she is morbidly obese plan is going to be to continue to monitor encourage compliance with recommended caloric reductions  3. Moderate persistent asthma with acute exacerbation Continue with Judithann Sauger continue with albuterol  4. Acute lower respiratory infection Cough likely related to post COVID I do not think there is an active infection at this time gave her a refill of Tussionex - chlorpheniramine-HYDROcodone (TUSSIONEX) 10-8 MG/5ML; Take 5 mLs by mouth every 12 (twelve) hours as needed for cough.  Dispense: 140 mL; Refill: 0   General Counseling: I have discussed the findings of the evaluation and examination with Helene Kelp.  I have also discussed any further diagnostic evaluation thatmay be needed or ordered today. Taeler verbalizes understanding of the findings of todays visit. We also reviewed her medications today and discussed drug interactions and side effects including but not limited excessive drowsiness and altered mental states. We also discussed that there is always a risk not just to her but also people around her. she has been encouraged to call the office with any questions or concerns that should arise related to todays visit.  No orders of the defined types were placed in this encounter.    Time spent: 74  I have personally  obtained a history, examined the patient, evaluated laboratory and imaging results, formulated the assessment and plan and placed orders.    Allyne Gee, MD [facility] Pulmonary and Critical Care Sleep medicine

## 2022-08-22 ENCOUNTER — Ambulatory Visit: Payer: Medicare HMO | Attending: Physician Assistant | Admitting: Physician Assistant

## 2022-08-22 ENCOUNTER — Encounter: Payer: Self-pay | Admitting: Physician Assistant

## 2022-08-22 VITALS — BP 119/78 | HR 67 | Resp 18 | Ht 65.0 in | Wt 356.2 lb

## 2022-08-22 DIAGNOSIS — M62838 Other muscle spasm: Secondary | ICD-10-CM

## 2022-08-22 DIAGNOSIS — M5134 Other intervertebral disc degeneration, thoracic region: Secondary | ICD-10-CM | POA: Diagnosis not present

## 2022-08-22 DIAGNOSIS — M19041 Primary osteoarthritis, right hand: Secondary | ICD-10-CM

## 2022-08-22 DIAGNOSIS — M797 Fibromyalgia: Secondary | ICD-10-CM

## 2022-08-22 DIAGNOSIS — Z79899 Other long term (current) drug therapy: Secondary | ICD-10-CM | POA: Diagnosis not present

## 2022-08-22 DIAGNOSIS — M170 Bilateral primary osteoarthritis of knee: Secondary | ICD-10-CM

## 2022-08-22 DIAGNOSIS — M5030 Other cervical disc degeneration, unspecified cervical region: Secondary | ICD-10-CM

## 2022-08-22 DIAGNOSIS — Z8639 Personal history of other endocrine, nutritional and metabolic disease: Secondary | ICD-10-CM

## 2022-08-22 DIAGNOSIS — M818 Other osteoporosis without current pathological fracture: Secondary | ICD-10-CM

## 2022-08-22 DIAGNOSIS — M5136 Other intervertebral disc degeneration, lumbar region: Secondary | ICD-10-CM

## 2022-08-22 DIAGNOSIS — Z8719 Personal history of other diseases of the digestive system: Secondary | ICD-10-CM | POA: Diagnosis not present

## 2022-08-22 DIAGNOSIS — M7062 Trochanteric bursitis, left hip: Secondary | ICD-10-CM

## 2022-08-22 DIAGNOSIS — Z8659 Personal history of other mental and behavioral disorders: Secondary | ICD-10-CM

## 2022-08-22 DIAGNOSIS — Z862 Personal history of diseases of the blood and blood-forming organs and certain disorders involving the immune mechanism: Secondary | ICD-10-CM

## 2022-08-22 DIAGNOSIS — M19042 Primary osteoarthritis, left hand: Secondary | ICD-10-CM

## 2022-08-22 DIAGNOSIS — M0579 Rheumatoid arthritis with rheumatoid factor of multiple sites without organ or systems involvement: Secondary | ICD-10-CM

## 2022-08-22 MED ORDER — HYDROXYCHLOROQUINE SULFATE 200 MG PO TABS: 200.0000 mg | ORAL_TABLET | Freq: Two times a day (BID) | ORAL | 0 refills | Status: DC

## 2022-08-23 MED FILL — Iron Sucrose Inj 20 MG/ML (Fe Equiv): INTRAVENOUS | Qty: 10 | Status: AC

## 2022-08-24 ENCOUNTER — Other Ambulatory Visit (HOSPITAL_COMMUNITY): Payer: Self-pay | Admitting: *Deleted

## 2022-08-24 ENCOUNTER — Inpatient Hospital Stay: Payer: Medicare HMO

## 2022-08-24 VITALS — BP 108/74 | HR 65 | Temp 99.0°F | Resp 18

## 2022-08-24 DIAGNOSIS — D519 Vitamin B12 deficiency anemia, unspecified: Secondary | ICD-10-CM | POA: Diagnosis not present

## 2022-08-24 DIAGNOSIS — E538 Deficiency of other specified B group vitamins: Secondary | ICD-10-CM

## 2022-08-24 DIAGNOSIS — D509 Iron deficiency anemia, unspecified: Secondary | ICD-10-CM | POA: Diagnosis not present

## 2022-08-24 MED ORDER — SODIUM CHLORIDE 0.9 % IV SOLN
Freq: Once | INTRAVENOUS | Status: AC
  Filled 2022-08-24: qty 250

## 2022-08-24 MED ORDER — SODIUM CHLORIDE 0.9 % IV SOLN
200.0000 mg | INTRAVENOUS | Status: DC
  Administered 2022-08-24: 200 mg via INTRAVENOUS
  Filled 2022-08-24: qty 10

## 2022-08-25 ENCOUNTER — Other Ambulatory Visit (HOSPITAL_COMMUNITY): Payer: Self-pay | Admitting: *Deleted

## 2022-08-25 MED ORDER — DESIPRAMINE HCL 25 MG PO TABS: 25.0000 mg | ORAL_TABLET | Freq: Every day | ORAL | 0 refills | Status: DC

## 2022-08-30 DIAGNOSIS — M810 Age-related osteoporosis without current pathological fracture: Secondary | ICD-10-CM | POA: Diagnosis not present

## 2022-08-31 ENCOUNTER — Inpatient Hospital Stay: Payer: Medicare HMO | Attending: Oncology

## 2022-08-31 VITALS — BP 123/92 | HR 69 | Temp 98.4°F | Resp 18

## 2022-08-31 DIAGNOSIS — D519 Vitamin B12 deficiency anemia, unspecified: Secondary | ICD-10-CM | POA: Insufficient documentation

## 2022-08-31 DIAGNOSIS — Z79899 Other long term (current) drug therapy: Secondary | ICD-10-CM | POA: Insufficient documentation

## 2022-08-31 DIAGNOSIS — E538 Deficiency of other specified B group vitamins: Secondary | ICD-10-CM

## 2022-08-31 MED ORDER — SODIUM CHLORIDE 0.9 % IV SOLN
200.0000 mg | INTRAVENOUS | Status: DC
  Administered 2022-08-31: 200 mg via INTRAVENOUS
  Filled 2022-08-31: qty 200

## 2022-08-31 MED ORDER — SODIUM CHLORIDE 0.9 % IV SOLN
Freq: Once | INTRAVENOUS | Status: AC
  Filled 2022-08-31: qty 250

## 2022-08-31 NOTE — Patient Instructions (Signed)
[facility]  Discharge Instructions: Thank you for choosing [facility] to provide your oncology and hematology care.  If you have a lab appointment with the Cancer Center, please go directly to the Cancer Center and check in at the registration area.  Wear comfortable clothing and clothing appropriate for easy access to any Portacath or PICC line.   We strive to give you quality time with your provider. You may need to reschedule your appointment if you arrive late (15 or more minutes).  Arriving late affects you and other patients whose appointments are after yours.  Also, if you miss three or more appointments without notifying the office, you may be dismissed from the clinic at the provider's discretion.      For prescription refill requests, have your pharmacy contact our office and allow 72 hours for refills to be completed.    Today you received the following chemotherapy and/or immunotherapy agents Venofer.      To help prevent nausea and vomiting after your treatment, we encourage you to take your nausea medication as directed.  BELOW ARE SYMPTOMS THAT SHOULD BE REPORTED IMMEDIATELY: *FEVER GREATER THAN 100.4 F (38 C) OR HIGHER *CHILLS OR SWEATING *NAUSEA AND VOMITING THAT IS NOT CONTROLLED WITH YOUR NAUSEA MEDICATION *UNUSUAL SHORTNESS OF BREATH *UNUSUAL BRUISING OR BLEEDING *URINARY PROBLEMS (pain or burning when urinating, or frequent urination) *BOWEL PROBLEMS (unusual diarrhea, constipation, pain near the anus) TENDERNESS IN MOUTH AND THROAT WITH OR WITHOUT PRESENCE OF ULCERS (sore throat, sores in mouth, or a toothache) UNUSUAL RASH, SWELLING OR PAIN  UNUSUAL VAGINAL DISCHARGE OR ITCHING   Items with * indicate a potential emergency and should be followed up as soon as possible or go to the Emergency Department if any problems should occur.  Please show the CHEMOTHERAPY ALERT CARD or IMMUNOTHERAPY ALERT CARD at check-in to  the Emergency Department and triage nurse.  Should you have questions after your visit or need to cancel or reschedule your appointment, please contact  [facility]  [phone] and follow the prompts.  Office hours are 8:00 a.m. to 4:30 p.m. Monday - Friday. Please note that voicemails left after 4:00 p.m. may not be returned until the following business day.  We are closed weekends and major holidays. You have access to a nurse at all times for urgent questions. Please call the main number to the clinic [phone] and follow the prompts.  For any non-urgent questions, you may also contact your provider using MyChart. We now offer e-Visits for anyone 18 and older to request care online for non-urgent symptoms. For details visit [email or web address].   Also download the MyChart app! Go to the app store, search "MyChart", open the app, select , and log in with your MyChart username and password.    

## 2022-09-07 ENCOUNTER — Inpatient Hospital Stay: Payer: Medicare HMO

## 2022-09-07 VITALS — BP 157/73 | HR 81 | Temp 97.2°F | Resp 18

## 2022-09-07 DIAGNOSIS — D519 Vitamin B12 deficiency anemia, unspecified: Secondary | ICD-10-CM | POA: Diagnosis not present

## 2022-09-07 DIAGNOSIS — Z79899 Other long term (current) drug therapy: Secondary | ICD-10-CM | POA: Diagnosis not present

## 2022-09-07 DIAGNOSIS — E538 Deficiency of other specified B group vitamins: Secondary | ICD-10-CM

## 2022-09-07 MED ORDER — SODIUM CHLORIDE 0.9 % IV SOLN
Freq: Once | INTRAVENOUS | Status: AC
  Filled 2022-09-07: qty 250

## 2022-09-07 MED ORDER — SODIUM CHLORIDE 0.9 % IV SOLN
200.0000 mg | INTRAVENOUS | Status: DC
  Administered 2022-09-07: 200 mg via INTRAVENOUS
  Filled 2022-09-07: qty 200

## 2022-09-11 ENCOUNTER — Other Ambulatory Visit: Payer: Self-pay

## 2022-09-11 DIAGNOSIS — B372 Candidiasis of skin and nail: Secondary | ICD-10-CM

## 2022-09-11 MED ORDER — NYSTATIN 100000 UNIT/GM EX POWD: 1.0000 | Freq: Two times a day (BID) | CUTANEOUS | 6 refills | Status: DC

## 2022-09-13 ENCOUNTER — Other Ambulatory Visit (HOSPITAL_COMMUNITY): Payer: Self-pay | Admitting: *Deleted

## 2022-09-13 ENCOUNTER — Ambulatory Visit (HOSPITAL_BASED_OUTPATIENT_CLINIC_OR_DEPARTMENT_OTHER): Payer: Medicare HMO | Admitting: Psychiatry

## 2022-09-13 DIAGNOSIS — F332 Major depressive disorder, recurrent severe without psychotic features: Secondary | ICD-10-CM

## 2022-09-13 DIAGNOSIS — F325 Major depressive disorder, single episode, in full remission: Secondary | ICD-10-CM

## 2022-09-13 DIAGNOSIS — F603 Borderline personality disorder: Secondary | ICD-10-CM | POA: Diagnosis not present

## 2022-09-13 DIAGNOSIS — F411 Generalized anxiety disorder: Secondary | ICD-10-CM

## 2022-09-13 MED ORDER — DESIPRAMINE HCL 25 MG PO TABS: 25.0000 mg | ORAL_TABLET | Freq: Every day | ORAL | 0 refills | Status: DC

## 2022-09-13 MED ORDER — LAMOTRIGINE 150 MG PO TABS: 150.0000 mg | ORAL_TABLET | Freq: Two times a day (BID) | ORAL | 1 refills | Status: DC

## 2022-09-13 MED ORDER — VENLAFAXINE HCL 100 MG PO TABS: 100.0000 mg | ORAL_TABLET | Freq: Three times a day (TID) | ORAL | 2 refills | Status: DC

## 2022-09-13 MED ORDER — CLONAZEPAM 0.5 MG PO TABS: ORAL_TABLET | ORAL | 5 refills | Status: DC

## 2022-09-13 MED ORDER — BUSPIRONE HCL 30 MG PO TABS: ORAL_TABLET | ORAL | 2 refills | Status: DC

## 2022-09-13 MED ORDER — TRAZODONE HCL 100 MG PO TABS: 100.0000 mg | ORAL_TABLET | Freq: Every evening | ORAL | 5 refills | Status: DC | PRN

## 2022-09-13 MED ORDER — HYDROXYZINE HCL 50 MG PO TABS: 50.0000 mg | ORAL_TABLET | Freq: Three times a day (TID) | ORAL | 2 refills | Status: DC | PRN

## 2022-09-13 NOTE — Progress Notes (Addendum)
`  Psychiatric Initial Adult Assessment   Patient Identification: Jamie Bennett MRN:  [id number] Date of Evaluation:  09/13/2022 Referral Source: grams per previous psychiatrist Chief Complaint:   Visit Diagnosis: borderline personality disorder   ICD-10-CM   1. Borderline personality disorder (HCC)  F60.3 venlafaxine (EFFEXOR) 100 MG tablet    lamoTRIgine (LAMICTAL) 150 MG tablet    traZODone (DESYREL) 100 MG tablet    2. Anxiety state  F41.1 venlafaxine (EFFEXOR) 100 MG tablet    busPIRone (BUSPAR) 30 MG tablet    hydrOXYzine (ATARAX) 50 MG tablet    clonazePAM (KLONOPIN) 0.5 MG tablet    3. Major depressive disorder, recurrent, severe without psychotic features (HCC)  F33.2 venlafaxine (EFFEXOR) 100 MG tablet    lamoTRIgine (LAMICTAL) 150 MG tablet    traZODone (DESYREL) 100 MG tablet       History of Pre   Today the patient seems to be at her baseline.  We spoke with her together with her husband Lyda Jester.  Patient is sleeping and eating well has good energy and her mood seems to be stable.  She drinks no alcohol and uses no drugs.  She is on multiple psychotropic medications.  She has had a lot of mechanical things breakdown in her home.  She got a number of utilities that been replaced including her room.  Overall though the patient seems to be emotionally very stable. Depression Symptoms:  fatigue, (Hypo) Manic Symptoms:   Anxiety Symptoms:   Psychotic Symptoms:   PTSD Symptoms:   Past Psychiatric History: 10 psychiatric hospitalizations multiple psychotropic medications presently in psychotherapy  Previous Psychotropic Medications: Yes   Substance Abuse History in the last 12 months:  Yes.    Consequences of Substance Abuse:   Past Medical History:  Past Medical History:  Diagnosis Date   Anemia    Anxiety    Bipolar 1 disorder (HCC)    Collagen vascular disease (HCC)    rhematoid arthritis   Constipation    Dyspnea    Fibromyalgia    GERD  (gastroesophageal reflux disease)    Heart murmur    mild-asymptomatic   Hepatic steatosis    History of kidney stones    History of Roux-en-Y gastric bypass    Hypotension    Hypothyroidism    Morbid obesity with BMI of 50.0-59.9, adult (HCC)    Opioid abuse (HCC)    Osteoarthritis    Osteoporosis    PONV (postoperative nausea and vomiting)    nausea only   Rheumatic fever    Rheumatoid arthritis (HCC)    Small bowel obstruction (HCC)    Thyroid disease     Past Surgical History:  Procedure Laterality Date   CHOLECYSTECTOMY  2004   COLONOSCOPY N/A 07/16/2017   Procedure: COLONOSCOPY;  Surgeon: Scot Jun, MD;  Location: [facility] ENDOSCOPY;  Service: Endoscopy;  Laterality: N/A;   EXPLORATORY LAPAROTOMY  2009   GASTRIC BYPASS  2003   [facility]   HEEL SPUR EXCISION Bilateral    KNEE ARTHROSCOPY WITH SUBCHONDROPLASTY Left 09/27/2021   Procedure: Left knee subchondroplasty medial tibial plateau;  Surgeon: Kennedy Bucker, MD;  Location: [facility] ORS;  Service: Orthopedics;  Laterality: Left;   LAPAROTOMY N/A 03/01/2018   Procedure: EXPLORATORY LAPAROTOMY/ UMBILECTOMY;  Surgeon: Earline Mayotte, MD;  Location: [facility] ORS;  Service: General;  Laterality: N/A;   TONSILLECTOMY     VENTRAL HERNIA REPAIR N/A 03/07/2018   Procedure: HERNIA REPAIR VENTRAL ADULT;  Surgeon: Earline Mayotte, MD;  Location: [facility] ORS;  Service: General;  Laterality: N/A;    Family Psychiatric History:   Family History:  Family History  Problem Relation Age of Onset   Depression Mother    Dementia Mother    Heart disease Mother    Osteoporosis Mother    Parkinson's disease Father    Liver disease Brother    Colon cancer Neg Hx     Social History:   Social History   Socioeconomic History   Marital status: Married    Spouse name: Not on file   Number of children: Not on file   Years of education: Not on file   Highest education level: Not on file  Occupational History   Not on file   Tobacco Use   Smoking status: Never    Passive exposure: Past   Smokeless tobacco: Never  Vaping Use   Vaping Use: Never used  Substance and Sexual Activity   Alcohol use: Not Currently   Drug use: No   Sexual activity: Yes  Other Topics Concern   Not on file  Social History Narrative   Not on file   Social Determinants of Health   Financial Resource Strain: Not on file  Food Insecurity: Not on file  Transportation Needs: Not on file  Physical Activity: Not on file  Stress: Not on file  Social Connections: Not on file    Additional Social History:   Allergies:   Allergies  Allergen Reactions   Lactose Intolerance (Gi) Other (See Comments)    Bloating and GI distress   Sulfa Antibiotics Hives    Metabolic Disorder Labs: Lab Results  Component Value Date   HGBA1C 5.0 09/01/2021   No results found for: "PROLACTIN" Lab Results  Component Value Date   CHOL 169 05/26/2020   TRIG 136 05/26/2020   HDL 73 05/26/2020   LDLCALC 73 05/26/2020     Current Medications: Current Outpatient Medications  Medication Sig Dispense Refill   acetaminophen (TYLENOL) 500 MG tablet Take 500-1,000 mg by mouth every 6 (six) hours as needed (pain.).     albuterol (VENTOLIN HFA) 108 (90 Base) MCG/ACT inhaler Inhale 2 puffs into the lungs every 6 (six) hours as needed for wheezing or shortness of breath. (Patient not taking: Reported on 08/22/2022) 8 g 3   benzonatate (TESSALON) 100 MG capsule Take 1-2 tablets 3 times a day as needed for cough (Patient not taking: Reported on 08/22/2022) 30 capsule 0   Budeson-Glycopyrrol-Formoterol (BREZTRI AEROSPHERE) 160-9-4.8 MCG/ACT AERO Inhale 2 puffs into the lungs 2 (two) times daily. 10.7 g 11   busPIRone (BUSPAR) 30 MG tablet 1 bid 180 tablet 2   Calcium Carbonate (CALCIUM 600 PO) Take 600 mg by mouth in the morning and at bedtime.     chlorpheniramine-HYDROcodone (TUSSIONEX) 10-8 MG/5ML Take 5 mLs by mouth every 12 (twelve) hours as needed for  cough. 140 mL 0   Cholecalciferol 125 MCG (5000 UT) TABS Take 5,000 Units by mouth in the morning.     clonazePAM (KLONOPIN) 0.5 MG tablet TAKE 1 TABLET BY MOUTH ONCE DAILY AS NEEDED FOR  ACUTE  ANXIETY. Pt may take 1 extra tablet (0.5 mg) PRN as needed for severe anxiety. 35 tablet 5   Coenzyme Q10 300 MG CAPS Take 300 mg by mouth in the morning.     cyanocobalamin (,VITAMIN B-12,) 1000 MCG/ML injection Inject 1,000 mcg into the muscle every 30 (thirty) days.      denosumab (PROLIA) 60 MG/ML SOSY injection Inject  60 mg into the skin every 6 (six) months.     desipramine (NORPRAMIN) 25 MG tablet Take 1 tablet (25 mg total) by mouth daily. 20 tablet 0   esomeprazole (NEXIUM) 20 MG capsule Take 20 mg by mouth in the morning.     estradiol (ESTRACE) 0.1 MG/GM vaginal cream Apply a pea-sized amount to index finger and wipe in vaginal introitus twice weekly (Patient not taking: Reported on 08/22/2022) 42.5 g 12   hydroxychloroquine (PLAQUENIL) 200 MG tablet Take 1 tablet (200 mg total) by mouth 2 (two) times daily. 180 tablet 0   hydrOXYzine (ATARAX) 50 MG tablet Take 1 tablet (50 mg total) by mouth 3 (three) times daily as needed for anxiety. 270 tablet 2   ipratropium-albuterol (DUONEB) 0.5-2.5 (3) MG/3ML SOLN Take 3 mLs by nebulization every 4 (four) hours as needed. (Patient not taking: Reported on 08/22/2022) 360 mL 1   Krill Oil 500 MG CAPS Take 500 mg by mouth daily at 12 noon.     lamoTRIgine (LAMICTAL) 150 MG tablet Take 1 tablet (150 mg total) by mouth 2 (two) times daily. 180 tablet 1   levothyroxine (SYNTHROID) 112 MCG tablet Take 1 tablet (112 mcg total) by mouth daily before breakfast. 90 tablet 1   loratadine (CLARITIN) 10 MG tablet Take 10 mg by mouth daily as needed for allergies.     magnesium oxide (MAG-OX) 400 MG tablet Take 400 mg by mouth every evening.     Multiple Vitamin (MULTIVITAMIN) tablet Take 1 tablet by mouth every evening.     mupirocin ointment (BACTROBAN) 2 % Apply 1  Application topically 2 (two) times daily. (Patient not taking: Reported on 08/22/2022) 30 g 3   Nebulizers (COMPRESSOR/NEBULIZER) MISC Dispense 1 machine with associated mask and tubing (Patient not taking: Reported on 08/22/2022) 1 each 0   nystatin (MYCOSTATIN/NYSTOP) powder Apply 1 Application topically 2 (two) times daily. 30 g 6   polyethylene glycol (MIRALAX / GLYCOLAX) 17 g packet Take 17 g by mouth in the morning and at bedtime.     psyllium (METAMUCIL) 58.6 % packet Take 1 packet by mouth in the morning.     sucralfate (CARAFATE) 1 g tablet Take 1 tablet (1 g total) by mouth 2 (two) times daily. 180 tablet 1   tiZANidine (ZANAFLEX) 4 MG tablet Take 1 tablet (4 mg total) by mouth 3 (three) times daily. 270 tablet 0   traZODone (DESYREL) 100 MG tablet Take 1 tablet (100 mg total) by mouth at bedtime as needed and may repeat dose one time if needed for sleep. 60 tablet 5   venlafaxine (EFFEXOR) 100 MG tablet Take 1 tablet (100 mg total) by mouth 3 (three) times daily with meals. 270 tablet 2   vitamin C (ASCORBIC ACID) 500 MG tablet Take 500 mg by mouth in the morning.     zinc gluconate 50 MG tablet Take 50 mg by mouth in the morning.     No current facility-administered medications for this visit.    Neurologic: Headache: No Seizure: No Paresthesias:NA  Musculoskeletal: Strength & Muscle Tone: within normal limits Gait & Station: normal Patient leans: N/A  Psychiatric Specialty Exam: ROS  There were no vitals taken for this visit.There is no height or weight on file to calculate BMI.  General Appearance: Bizarre  Eye Contact:  Good  Speech:  Normal Rate  Volume:  Normal  Mood:  Anxious  Affect:  Congruent  Thought Process:  Goal Directed  Orientation:  NA  Thought Content:  Logical  Suicidal Thoughts:  No  Homicidal Thoughts:  No  Memory:  Negative  Judgement:  Fair  Insight:  NA and Good  Psychomotor Activity:  Normal  Concentration:    Recall:  Good  Fund of  Knowledge:  Language: Good  Akathisia:  No  Handed:  Right  AIMS (if indicated):    Assets:  Desire for Improvement  ADL's:  Intact  Cognition: WNL  Sleep:      Treatment Plan Summary:  Today the patient was interviewed in person.  She made an error did realize that she needed to come in.  When she returns in 3 months she will be seen in the office.  For now we will continue all her medications.  She is being treated for major clinical depression and takes Effexor and desipramine.  She is being treated for an adjustment disorder with an anxious mood state by taking BuSpar and Vistaril.  30 she is being treated for mood disorder most likely that of borderline personality disorder.  She takes Lamictal for that condition and does well.  For the most part all his first status emotionally is stable.  She is not suicidal.  She will return to see me in 3 months. 2/14/20242:05 PM

## 2022-09-14 ENCOUNTER — Inpatient Hospital Stay: Payer: Medicare HMO

## 2022-09-14 VITALS — BP 146/80 | HR 78 | Temp 97.8°F | Resp 18

## 2022-09-14 DIAGNOSIS — E538 Deficiency of other specified B group vitamins: Secondary | ICD-10-CM

## 2022-09-14 DIAGNOSIS — Z79899 Other long term (current) drug therapy: Secondary | ICD-10-CM | POA: Diagnosis not present

## 2022-09-14 DIAGNOSIS — D519 Vitamin B12 deficiency anemia, unspecified: Secondary | ICD-10-CM | POA: Diagnosis not present

## 2022-09-14 MED ORDER — SODIUM CHLORIDE 0.9 % IV SOLN
Freq: Once | INTRAVENOUS | Status: AC
  Filled 2022-09-14: qty 250

## 2022-09-14 MED ORDER — CYANOCOBALAMIN 1000 MCG/ML IJ SOLN
1000.0000 ug | Freq: Once | INTRAMUSCULAR | Status: AC
  Administered 2022-09-14: 1000 ug via INTRAMUSCULAR
  Filled 2022-09-14: qty 1

## 2022-09-14 MED ORDER — SODIUM CHLORIDE 0.9 % IV SOLN
200.0000 mg | INTRAVENOUS | Status: DC
  Administered 2022-09-14: 200 mg via INTRAVENOUS
  Filled 2022-09-14: qty 200

## 2022-09-15 ENCOUNTER — Other Ambulatory Visit (HOSPITAL_COMMUNITY): Payer: Self-pay | Admitting: *Deleted

## 2022-09-15 MED ORDER — DESIPRAMINE HCL 25 MG PO TABS: 25.0000 mg | ORAL_TABLET | Freq: Every day | ORAL | 2 refills | Status: DC

## 2022-09-16 NOTE — Procedures (Signed)
[facility] [place], [address]    Complete Pulmonary Function Testing Interpretation:  FINDINGS:  Forced vital capacity is mildly decreased FEV1 is mildly decreased F1 FVC ratio is normal.  Postbronchodilator no significant change in FEV1.  Total lung capacity is mildly decreased residual volume is decreased FRC is decreased.  DLCO was within normal limits  IMPRESSION:  This pulmonary function study is suggestive of a mild restrictive lung disease.  There is no response to bronchodilators clinical correlation is recommended  Allyne Gee, MD [facility] Pulmonary Critical Care Medicine Sleep Medicine

## 2022-09-18 ENCOUNTER — Other Ambulatory Visit: Payer: Self-pay

## 2022-09-18 DIAGNOSIS — G8929 Other chronic pain: Secondary | ICD-10-CM

## 2022-09-18 MED ORDER — TIZANIDINE HCL 4 MG PO TABS: 4.0000 mg | ORAL_TABLET | Freq: Three times a day (TID) | ORAL | 0 refills | Status: DC

## 2022-10-03 LAB — PULMONARY FUNCTION TEST

## 2022-10-13 ENCOUNTER — Inpatient Hospital Stay: Payer: Medicare HMO | Attending: Oncology

## 2022-10-13 DIAGNOSIS — E538 Deficiency of other specified B group vitamins: Secondary | ICD-10-CM

## 2022-10-13 DIAGNOSIS — D519 Vitamin B12 deficiency anemia, unspecified: Secondary | ICD-10-CM | POA: Insufficient documentation

## 2022-10-13 MED ORDER — CYANOCOBALAMIN 1000 MCG/ML IJ SOLN
1000.0000 ug | Freq: Once | INTRAMUSCULAR | Status: AC
  Administered 2022-10-13: 1000 ug via INTRAMUSCULAR
  Filled 2022-10-13: qty 1

## 2022-10-17 ENCOUNTER — Other Ambulatory Visit (HOSPITAL_COMMUNITY): Payer: Self-pay

## 2022-11-13 ENCOUNTER — Inpatient Hospital Stay: Payer: Medicare HMO

## 2022-11-19 ENCOUNTER — Other Ambulatory Visit: Payer: Self-pay | Admitting: Nurse Practitioner

## 2022-11-19 DIAGNOSIS — G8929 Other chronic pain: Secondary | ICD-10-CM

## 2022-11-23 ENCOUNTER — Telehealth: Payer: Medicare HMO | Admitting: Physician Assistant

## 2022-12-04 ENCOUNTER — Other Ambulatory Visit: Payer: Self-pay | Admitting: *Deleted

## 2022-12-04 DIAGNOSIS — M0579 Rheumatoid arthritis with rheumatoid factor of multiple sites without organ or systems involvement: Secondary | ICD-10-CM

## 2022-12-04 MED ORDER — HYDROXYCHLOROQUINE SULFATE 200 MG PO TABS: 200.0000 mg | ORAL_TABLET | Freq: Two times a day (BID) | ORAL | 0 refills | Status: DC

## 2022-12-04 NOTE — Telephone Encounter (Signed)
Last Fill: 08/22/2022  Eye exam: 04/24/2022 WNL   Labs: 08/03/2022 CBC w/Diff Hemoglobin 11.8, BMP CO2 21, Glucose 126, Calcium 8.8,   Next Visit: 01/24/2023  Last Visit: 08/22/2022   DX: Rheumatoid arthritis involving multiple sites with positive rheumatoid factor   Current Dose per office note 08/22/2022: Plaquenil 200 mg 1 tablet by mouth twice daily.    Okay to refill Plaquenil?

## 2022-12-05 ENCOUNTER — Ambulatory Visit (INDEPENDENT_AMBULATORY_CARE_PROVIDER_SITE_OTHER): Payer: Medicare HMO | Admitting: Physician Assistant

## 2022-12-05 DIAGNOSIS — Z87440 Personal history of urinary (tract) infections: Secondary | ICD-10-CM | POA: Diagnosis not present

## 2022-12-05 DIAGNOSIS — N390 Urinary tract infection, site not specified: Secondary | ICD-10-CM

## 2022-12-05 NOTE — Progress Notes (Signed)
Virtual Visit via Telephone Note  I connected with Jamie Bennett on 12/05/22 at 11:00 AM EDT by telephone and verified that I am speaking with the correct person using two identifiers.  Location: Patient: Home in [place], [place] Provider: Clinic in [place], [place]   I discussed the limitations, risks, security and privacy concerns of performing an evaluation and management service by telephone and the availability of in person appointments. I also discussed with the patient that there may be a patient responsible charge related to this service. The patient expressed understanding and agreed to proceed.  History of Present Illness: Jamie Bennett is a 62 y.o. female with PMH rUTI previously on suppressive Macrobid who presents today to discuss suppressive therapy.  Today she reports that she stopped suppressive Macrobid not long after her last office visit with me about 6 months ago.  She has had no infections since that time.  She is no longer using topical vaginal estrogen cream, but remains on cranberry supplements once daily.  Overall she is very pleased with her progress and has no acute concerns today.   Observations/Objective: Asking appropriate questions, no apparent distress.  Assessment and Plan: 1. Recurrent UTI 6 months infection free now off suppressive antibiotics and estrogen cream.  Encouraged her to continue cranberry supplements and follow-up with [place] as needed.  She is in agreement with this plan.  If her rUTIs return, would recommend resuming estrogen cream prior to antibiotic ppx.  Follow Up Instructions: Return if symptoms worsen or fail to improve.    I discussed the assessment and treatment plan with the patient. The patient was provided an opportunity to ask questions and all were answered. The patient agreed with the plan and demonstrated an understanding of the instructions.   The patient was advised to call back or seek an in-person evaluation if the  symptoms worsen or if the condition fails to improve as anticipated.  I provided 5 minutes of non-face-to-face time during this encounter.   Carman Ching, PA-C

## 2022-12-07 DIAGNOSIS — F319 Bipolar disorder, unspecified: Secondary | ICD-10-CM | POA: Diagnosis not present

## 2022-12-11 ENCOUNTER — Ambulatory Visit (INDEPENDENT_AMBULATORY_CARE_PROVIDER_SITE_OTHER): Payer: Medicare HMO | Admitting: Physician Assistant

## 2022-12-11 ENCOUNTER — Encounter: Payer: Self-pay | Admitting: Physician Assistant

## 2022-12-11 VITALS — BP 150/80 | HR 73 | Temp 97.9°F | Resp 16 | Ht 65.0 in | Wt 364.8 lb

## 2022-12-11 DIAGNOSIS — R030 Elevated blood-pressure reading, without diagnosis of hypertension: Secondary | ICD-10-CM

## 2022-12-11 DIAGNOSIS — U099 Post covid-19 condition, unspecified: Secondary | ICD-10-CM | POA: Diagnosis not present

## 2022-12-11 DIAGNOSIS — R053 Chronic cough: Secondary | ICD-10-CM | POA: Diagnosis not present

## 2022-12-11 DIAGNOSIS — Z6841 Body Mass Index (BMI) 40.0 and over, adult: Secondary | ICD-10-CM

## 2022-12-11 DIAGNOSIS — K269 Duodenal ulcer, unspecified as acute or chronic, without hemorrhage or perforation: Secondary | ICD-10-CM | POA: Diagnosis not present

## 2022-12-11 MED ORDER — SUCRALFATE 1 G PO TABS
1.0000 g | ORAL_TABLET | Freq: Two times a day (BID) | ORAL | 1 refills | Status: DC
Start: 2022-12-11 — End: 2023-12-07
  Filled 2023-09-08: qty 180, 90d supply, fill #0

## 2022-12-11 NOTE — Progress Notes (Signed)
[facility] [address] [place], [place] [address]  Internal MEDICINE  Office Visit Note  Patient Name: Jamie Bennett  [id number]  [id number]  Date of Service: 12/13/2022  Chief Complaint  Patient presents with   Follow-up   Gastroesophageal Reflux    HPI Pt is here for routine follow up -Still having some wheezing and using inhaler in AM. Unable to lay flat still since covid. Would like to follow up with pulmonology -Lots of stress, brother recently passed away after being in the hospital. Has been following with psych and has a visit tomorrow -Bp did not improve on recheck. Will start monitoring at home. Normally avoids sodium but did have stir fry last night with soy sauce and states it was salty and may have driven it up. Will call office if staying high.  -Goes to have Upper GI on wed for bariatric clinic and endoscopy on monday  Current Medication: Outpatient Encounter Medications as of 12/11/2022  Medication Sig   acetaminophen (TYLENOL) 500 MG tablet Take 500-1,000 mg by mouth every 6 (six) hours as needed (pain.).   albuterol (VENTOLIN HFA) 108 (90 Base) MCG/ACT inhaler Inhale 2 puffs into the lungs every 6 (six) hours as needed for wheezing or shortness of breath.   benzonatate (TESSALON) 100 MG capsule Take 1-2 tablets 3 times a day as needed for cough   Budeson-Glycopyrrol-Formoterol (BREZTRI AEROSPHERE) 160-9-4.8 MCG/ACT AERO Inhale 2 puffs into the lungs 2 (two) times daily.   Calcium Carbonate (CALCIUM 600 PO) Take 600 mg by mouth in the morning and at bedtime.   chlorpheniramine-HYDROcodone (TUSSIONEX) 10-8 MG/5ML Take 5 mLs by mouth every 12 (twelve) hours as needed for cough.   Cholecalciferol 125 MCG (5000 UT) TABS Take 5,000 Units by mouth in the morning.   Coenzyme Q10 300 MG CAPS Take 300 mg by mouth in the morning.   cyanocobalamin (,VITAMIN B-12,) 1000 MCG/ML injection Inject 1,000 mcg into the muscle every 30 (thirty) days.    denosumab  (PROLIA) 60 MG/ML SOSY injection Inject 60 mg into the skin every 6 (six) months.   esomeprazole (NEXIUM) 20 MG capsule Take 20 mg by mouth in the morning.   hydroxychloroquine (PLAQUENIL) 200 MG tablet Take 1 tablet (200 mg total) by mouth 2 (two) times daily.   ipratropium-albuterol (DUONEB) 0.5-2.5 (3) MG/3ML SOLN Take 3 mLs by nebulization every 4 (four) hours as needed.   Krill Oil 500 MG CAPS Take 500 mg by mouth daily at 12 noon.   levothyroxine (SYNTHROID) 112 MCG tablet Take 1 tablet (112 mcg total) by mouth daily before breakfast.   loratadine (CLARITIN) 10 MG tablet Take 10 mg by mouth daily as needed for allergies.   magnesium oxide (MAG-OX) 400 MG tablet Take 400 mg by mouth every evening.   Multiple Vitamin (MULTIVITAMIN) tablet Take 1 tablet by mouth every evening.   mupirocin ointment (BACTROBAN) 2 % Apply 1 Application topically 2 (two) times daily.   Nebulizers (COMPRESSOR/NEBULIZER) MISC Dispense 1 machine with associated mask and tubing   nystatin (MYCOSTATIN/NYSTOP) powder Apply 1 Application topically 2 (two) times daily.   polyethylene glycol (MIRALAX / GLYCOLAX) 17 g packet Take 17 g by mouth in the morning and at bedtime.   psyllium (METAMUCIL) 58.6 % packet Take 1 packet by mouth in the morning.   tiZANidine (ZANAFLEX) 4 MG tablet TAKE 1 TABLET 3 TIMES A DAY   vitamin C (ASCORBIC ACID) 500 MG tablet Take 500 mg by mouth in the morning.  zinc gluconate 50 MG tablet Take 50 mg by mouth in the morning.   [DISCONTINUED] busPIRone (BUSPAR) 30 MG tablet 1 bid   [DISCONTINUED] clonazePAM (KLONOPIN) 0.5 MG tablet TAKE 1 TABLET BY MOUTH ONCE DAILY AS NEEDED FOR  ACUTE  ANXIETY. Pt may take 1 extra tablet (0.5 mg) PRN as needed for severe anxiety.   [DISCONTINUED] desipramine (NORPRAMIN) 25 MG tablet Take 1 tablet (25 mg total) by mouth daily.   [DISCONTINUED] hydrOXYzine (ATARAX) 50 MG tablet Take 1 tablet (50 mg total) by mouth 3 (three) times daily as needed for anxiety.    [DISCONTINUED] lamoTRIgine (LAMICTAL) 150 MG tablet Take 1 tablet (150 mg total) by mouth 2 (two) times daily.   [DISCONTINUED] sucralfate (CARAFATE) 1 g tablet Take 1 tablet (1 g total) by mouth 2 (two) times daily.   [DISCONTINUED] traZODone (DESYREL) 100 MG tablet Take 1 tablet (100 mg total) by mouth at bedtime as needed and may repeat dose one time if needed for sleep.   [DISCONTINUED] venlafaxine (EFFEXOR) 100 MG tablet Take 1 tablet (100 mg total) by mouth 3 (three) times daily with meals.   sucralfate (CARAFATE) 1 g tablet Take 1 tablet (1 g total) by mouth 2 (two) times daily.   No facility-administered encounter medications on file as of 12/11/2022.    Surgical History: Past Surgical History:  Procedure Laterality Date   CHOLECYSTECTOMY  2004   COLONOSCOPY N/A 07/16/2017   Procedure: COLONOSCOPY;  Surgeon: Scot Jun, MD;  Location: [facility] ENDOSCOPY;  Service: Endoscopy;  Laterality: N/A;   EXPLORATORY LAPAROTOMY  2009   GASTRIC BYPASS  2003   [facility]   HEEL SPUR EXCISION Bilateral    KNEE ARTHROSCOPY WITH SUBCHONDROPLASTY Left 09/27/2021   Procedure: Left knee subchondroplasty medial tibial plateau;  Surgeon: Kennedy Bucker, MD;  Location: [facility] ORS;  Service: Orthopedics;  Laterality: Left;   LAPAROTOMY N/A 03/01/2018   Procedure: EXPLORATORY LAPAROTOMY/ UMBILECTOMY;  Surgeon: Earline Mayotte, MD;  Location: [facility] ORS;  Service: General;  Laterality: N/A;   TONSILLECTOMY     VENTRAL HERNIA REPAIR N/A 03/07/2018   Procedure: HERNIA REPAIR VENTRAL ADULT;  Surgeon: Earline Mayotte, MD;  Location: [facility] ORS;  Service: General;  Laterality: N/A;    Medical History: Past Medical History:  Diagnosis Date   Anemia    Anxiety    Bipolar 1 disorder (HCC)    Collagen vascular disease (HCC)    rhematoid arthritis   Constipation    Dyspnea    Fibromyalgia    GERD (gastroesophageal reflux disease)    Heart murmur    mild-asymptomatic   Hepatic steatosis     History of kidney stones    History of Roux-en-Y gastric bypass    Hypotension    Hypothyroidism    Morbid obesity with BMI of 50.0-59.9, adult (HCC)    Opioid abuse (HCC)    Osteoarthritis    Osteoporosis    PONV (postoperative nausea and vomiting)    nausea only   Rheumatic fever    Rheumatoid arthritis (HCC)    Small bowel obstruction (HCC)    Thyroid disease     Family History: Family History  Problem Relation Age of Onset   Depression Mother    Dementia Mother    Heart disease Mother    Osteoporosis Mother    Parkinson's disease Father    Liver disease Brother    Colon cancer Neg Hx     Social History   Socioeconomic History   Marital  status: Married    Spouse name: Not on file   Number of children: Not on file   Years of education: Not on file   Highest education level: Not on file  Occupational History   Not on file  Tobacco Use   Smoking status: Never    Passive exposure: Past   Smokeless tobacco: Never  Vaping Use   Vaping Use: Never used  Substance and Sexual Activity   Alcohol use: Not Currently   Drug use: No   Sexual activity: Yes  Other Topics Concern   Not on file  Social History Narrative   Not on file   Social Determinants of Health   Financial Resource Strain: Not on file  Food Insecurity: Not on file  Transportation Needs: Not on file  Physical Activity: Not on file  Stress: Not on file  Social Connections: Not on file  Intimate Partner Violence: Not on file      Review of Systems  Constitutional:  Negative for chills, fatigue and unexpected weight change.  HENT:  Positive for postnasal drip. Negative for congestion, rhinorrhea, sneezing and sore throat.   Eyes:  Negative for redness.  Respiratory:  Positive for wheezing. Negative for chest tightness and shortness of breath.   Cardiovascular:  Negative for chest pain and palpitations.  Gastrointestinal:  Negative for abdominal pain, constipation, diarrhea, nausea and vomiting.   Genitourinary:  Negative for dysuria and frequency.  Musculoskeletal:  Positive for arthralgias. Negative for back pain, joint swelling and neck pain.  Skin:  Negative for rash.  Neurological: Negative.  Negative for tremors and numbness.  Hematological:  Negative for adenopathy. Does not bruise/bleed easily.  Psychiatric/Behavioral:  Positive for dysphoric mood. Negative for behavioral problems (Depression), sleep disturbance and suicidal ideas. The patient is nervous/anxious.     Vital Signs: BP (!) 150/80   Pulse 73   Temp 97.9 F (36.6 C)   Resp 16   Ht 5\' 5"  (1.651 m)   Wt (!) 364 lb 12.8 oz (165.5 kg)   SpO2 98%   BMI 60.71 kg/m    Physical Exam Vitals reviewed.  Constitutional:      General: She is not in acute distress.    Appearance: Normal appearance. She is obese. She is not ill-appearing.  HENT:     Head: Normocephalic and atraumatic.  Eyes:     Extraocular Movements: Extraocular movements intact.     Pupils: Pupils are equal, round, and reactive to light.  Cardiovascular:     Rate and Rhythm: Normal rate and regular rhythm.  Pulmonary:     Effort: Pulmonary effort is normal. No respiratory distress.  Abdominal:     General: Abdomen is flat.  Musculoskeletal:     Cervical back: Normal range of motion.  Skin:    General: Skin is warm and dry.  Neurological:     Mental Status: She is alert and oriented to person, place, and time.     Cranial Nerves: No cranial nerve deficit.     Coordination: Coordination normal.     Gait: Gait normal.  Psychiatric:        Mood and Affect: Mood normal.        Behavior: Behavior normal.        Assessment/Plan: 1. Elevated BP without diagnosis of hypertension Will start monitoring at home and call office if elevated. May need to start BP medication if so  2. Post-COVID chronic cough Will follow up with pulmonology  3. Duodenal ulcer disease -  sucralfate (CARAFATE) 1 g tablet; Take 1 tablet (1 g total) by mouth  2 (two) times daily.  Dispense: 180 tablet; Refill: 1  4. Morbid obesity with BMI of 60.0-69.9, adult [facility]) Following with bariatrics now   General Counseling: Lamiracle verbalizes understanding of the findings of todays visit and agrees with plan of treatment. I have discussed any further diagnostic evaluation that may be needed or ordered today. We also reviewed her medications today. she has been encouraged to call the office with any questions or concerns that should arise related to todays visit.    No orders of the defined types were placed in this encounter.   Meds ordered this encounter  Medications   sucralfate (CARAFATE) 1 g tablet    Sig: Take 1 tablet (1 g total) by mouth 2 (two) times daily.    Dispense:  180 tablet    Refill:  1    This patient was seen by Lynn Ito, PA-C in collaboration with Dr. Beverely Risen as a part of collaborative care agreement.   Total time spent:30 Minutes Time spent includes review of chart, medications, test results, and follow up plan with the patient.      Dr Lyndon Code Internal medicine

## 2022-12-12 ENCOUNTER — Encounter (HOSPITAL_COMMUNITY): Payer: Self-pay | Admitting: Psychiatry

## 2022-12-12 ENCOUNTER — Ambulatory Visit (HOSPITAL_BASED_OUTPATIENT_CLINIC_OR_DEPARTMENT_OTHER): Payer: Medicare HMO | Admitting: Psychiatry

## 2022-12-12 VITALS — BP 145/84 | HR 80 | Ht 65.0 in | Wt 365.0 lb

## 2022-12-12 DIAGNOSIS — F603 Borderline personality disorder: Secondary | ICD-10-CM

## 2022-12-12 DIAGNOSIS — F329 Major depressive disorder, single episode, unspecified: Secondary | ICD-10-CM

## 2022-12-12 DIAGNOSIS — F411 Generalized anxiety disorder: Secondary | ICD-10-CM

## 2022-12-12 DIAGNOSIS — F332 Major depressive disorder, recurrent severe without psychotic features: Secondary | ICD-10-CM

## 2022-12-12 MED ORDER — VENLAFAXINE HCL 100 MG PO TABS
100.0000 mg | ORAL_TABLET | Freq: Three times a day (TID) | ORAL | 2 refills | Status: DC
Start: 2022-12-12 — End: 2023-04-24

## 2022-12-12 MED ORDER — LAMOTRIGINE 150 MG PO TABS
150.0000 mg | ORAL_TABLET | Freq: Two times a day (BID) | ORAL | 1 refills | Status: DC
Start: 2022-12-12 — End: 2023-04-24

## 2022-12-12 MED ORDER — HYDROXYZINE HCL 50 MG PO TABS
50.0000 mg | ORAL_TABLET | Freq: Three times a day (TID) | ORAL | 2 refills | Status: DC | PRN
Start: 2022-12-12 — End: 2023-04-24

## 2022-12-12 MED ORDER — BUSPIRONE HCL 30 MG PO TABS
ORAL_TABLET | ORAL | 2 refills | Status: DC
Start: 2022-12-12 — End: 2023-04-25

## 2022-12-12 MED ORDER — CLONAZEPAM 0.5 MG PO TABS
ORAL_TABLET | ORAL | 5 refills | Status: DC
Start: 2022-12-12 — End: 2023-04-25

## 2022-12-12 MED ORDER — TRAZODONE HCL 100 MG PO TABS
100.0000 mg | ORAL_TABLET | Freq: Every evening | ORAL | 5 refills | Status: DC | PRN
Start: 2022-12-12 — End: 2023-04-24

## 2022-12-12 MED ORDER — DESIPRAMINE HCL 25 MG PO TABS: 25.0000 mg | ORAL_TABLET | Freq: Every day | ORAL | 3 refills | Status: DC

## 2022-12-12 NOTE — Progress Notes (Signed)
`  Psychiatric Initial Adult Assessment   Patient Identification: Jamie Bennett MRN:  [id number] Date of Evaluation:  12/12/2022 Referral Source: grams per previous psychiatrist Chief Complaint:   Chief Complaint   Follow-up    Visit Diagnosis: borderline personality disorder   ICD-10-CM   1. Borderline personality disorder (HCC)  F60.3 venlafaxine (EFFEXOR) 100 MG tablet    traZODone (DESYREL) 100 MG tablet    lamoTRIgine (LAMICTAL) 150 MG tablet    2. Anxiety state  F41.1 venlafaxine (EFFEXOR) 100 MG tablet    clonazePAM (KLONOPIN) 0.5 MG tablet    busPIRone (BUSPAR) 30 MG tablet    hydrOXYzine (ATARAX) 50 MG tablet    3. Major depressive disorder, recurrent, severe without psychotic features (HCC)  F33.2 venlafaxine (EFFEXOR) 100 MG tablet    traZODone (DESYREL) 100 MG tablet    lamoTRIgine (LAMICTAL) 150 MG tablet       History of Pre Today's date is 12/12/2022.  This patient seems to be actually doing fairly well.  Her brother recently died and she is handling that pretty well.  She denies being depressed.  She is sleeping and eating fairly well.  She takes no naps.  She was relatively close to her brother.  The patient is underweight using system and will be having an endoscopy soon.  She has had bypass surgery in the past.  Her husband is stating.  The patient denies being depressed or anxious on a persistent basis.  She has no evidence of psychosis.  She drinks no alcohol and uses no drugs.  She is actually quite stable today. Depression Symptoms:  fatigue, (Hypo) Manic Symptoms:   Anxiety Symptoms:   Psychotic Symptoms:   PTSD Symptoms:   Past Psychiatric History: 10 psychiatric hospitalizations multiple psychotropic medications presently in psychotherapy  Previous Psychotropic Medications: Yes   Substance Abuse History in the last 12 months:  Yes.    Consequences of Substance Abuse:   Past Medical History:  Past Medical History:  Diagnosis Date   Anemia     Anxiety    Bipolar 1 disorder (HCC)    Collagen vascular disease (HCC)    rhematoid arthritis   Constipation    Dyspnea    Fibromyalgia    GERD (gastroesophageal reflux disease)    Heart murmur    mild-asymptomatic   Hepatic steatosis    History of kidney stones    History of Roux-en-Y gastric bypass    Hypotension    Hypothyroidism    Morbid obesity with BMI of 50.0-59.9, adult (HCC)    Opioid abuse (HCC)    Osteoarthritis    Osteoporosis    PONV (postoperative nausea and vomiting)    nausea only   Rheumatic fever    Rheumatoid arthritis (HCC)    Small bowel obstruction (HCC)    Thyroid disease     Past Surgical History:  Procedure Laterality Date   CHOLECYSTECTOMY  2004   COLONOSCOPY N/A 07/16/2017   Procedure: COLONOSCOPY;  Surgeon: Scot Jun, MD;  Location: [facility] ENDOSCOPY;  Service: Endoscopy;  Laterality: N/A;   EXPLORATORY LAPAROTOMY  2009   GASTRIC BYPASS  2003   [facility]   HEEL SPUR EXCISION Bilateral    KNEE ARTHROSCOPY WITH SUBCHONDROPLASTY Left 09/27/2021   Procedure: Left knee subchondroplasty medial tibial plateau;  Surgeon: Kennedy Bucker, MD;  Location: [facility] ORS;  Service: Orthopedics;  Laterality: Left;   LAPAROTOMY N/A 03/01/2018   Procedure: EXPLORATORY LAPAROTOMY/ UMBILECTOMY;  Surgeon: Earline Mayotte, MD;  Location: [facility]  ORS;  Service: General;  Laterality: N/A;   TONSILLECTOMY     VENTRAL HERNIA REPAIR N/A 03/07/2018   Procedure: HERNIA REPAIR VENTRAL ADULT;  Surgeon: Earline Mayotte, MD;  Location: [facility] ORS;  Service: General;  Laterality: N/A;    Family Psychiatric History:   Family History:  Family History  Problem Relation Age of Onset   Depression Mother    Dementia Mother    Heart disease Mother    Osteoporosis Mother    Parkinson's disease Father    Liver disease Brother    Colon cancer Neg Hx     Social History:   Social History   Socioeconomic History   Marital status: Married    Spouse name: Not on  file   Number of children: Not on file   Years of education: Not on file   Highest education level: Not on file  Occupational History   Not on file  Tobacco Use   Smoking status: Never    Passive exposure: Past   Smokeless tobacco: Never  Vaping Use   Vaping Use: Never used  Substance and Sexual Activity   Alcohol use: Not Currently   Drug use: No   Sexual activity: Yes  Other Topics Concern   Not on file  Social History Narrative   Not on file   Social Determinants of Health   Financial Resource Strain: Not on file  Food Insecurity: Not on file  Transportation Needs: Not on file  Physical Activity: Not on file  Stress: Not on file  Social Connections: Not on file    Additional Social History:   Allergies:   Allergies  Allergen Reactions   Lactose Intolerance (Gi) Other (See Comments)    Bloating and GI distress   Sulfa Antibiotics Hives    Metabolic Disorder Labs: Lab Results  Component Value Date   HGBA1C 5.0 09/01/2021   No results found for: "PROLACTIN" Lab Results  Component Value Date   CHOL 169 05/26/2020   TRIG 136 05/26/2020   HDL 73 05/26/2020   LDLCALC 73 05/26/2020     Current Medications: Current Outpatient Medications  Medication Sig Dispense Refill   acetaminophen (TYLENOL) 500 MG tablet Take 500-1,000 mg by mouth every 6 (six) hours as needed (pain.).     albuterol (VENTOLIN HFA) 108 (90 Base) MCG/ACT inhaler Inhale 2 puffs into the lungs every 6 (six) hours as needed for wheezing or shortness of breath. 8 g 3   benzonatate (TESSALON) 100 MG capsule Take 1-2 tablets 3 times a day as needed for cough 30 capsule 0   Budeson-Glycopyrrol-Formoterol (BREZTRI AEROSPHERE) 160-9-4.8 MCG/ACT AERO Inhale 2 puffs into the lungs 2 (two) times daily. 10.7 g 11   Calcium Carbonate (CALCIUM 600 PO) Take 600 mg by mouth in the morning and at bedtime.     chlorpheniramine-HYDROcodone (TUSSIONEX) 10-8 MG/5ML Take 5 mLs by mouth every 12 (twelve) hours as  needed for cough. 140 mL 0   Cholecalciferol 125 MCG (5000 UT) TABS Take 5,000 Units by mouth in the morning.     Coenzyme Q10 300 MG CAPS Take 300 mg by mouth in the morning.     cyanocobalamin (,VITAMIN B-12,) 1000 MCG/ML injection Inject 1,000 mcg into the muscle every 30 (thirty) days.      denosumab (PROLIA) 60 MG/ML SOSY injection Inject 60 mg into the skin every 6 (six) months.     esomeprazole (NEXIUM) 20 MG capsule Take 20 mg by mouth in the morning.  hydroxychloroquine (PLAQUENIL) 200 MG tablet Take 1 tablet (200 mg total) by mouth 2 (two) times daily. 180 tablet 0   ipratropium-albuterol (DUONEB) 0.5-2.5 (3) MG/3ML SOLN Take 3 mLs by nebulization every 4 (four) hours as needed. 360 mL 1   Krill Oil 500 MG CAPS Take 500 mg by mouth daily at 12 noon.     levothyroxine (SYNTHROID) 112 MCG tablet Take 1 tablet (112 mcg total) by mouth daily before breakfast. 90 tablet 1   loratadine (CLARITIN) 10 MG tablet Take 10 mg by mouth daily as needed for allergies.     magnesium oxide (MAG-OX) 400 MG tablet Take 400 mg by mouth every evening.     Multiple Vitamin (MULTIVITAMIN) tablet Take 1 tablet by mouth every evening.     mupirocin ointment (BACTROBAN) 2 % Apply 1 Application topically 2 (two) times daily. 30 g 3   Nebulizers (COMPRESSOR/NEBULIZER) MISC Dispense 1 machine with associated mask and tubing 1 each 0   nystatin (MYCOSTATIN/NYSTOP) powder Apply 1 Application topically 2 (two) times daily. 30 g 6   polyethylene glycol (MIRALAX / GLYCOLAX) 17 g packet Take 17 g by mouth in the morning and at bedtime.     psyllium (METAMUCIL) 58.6 % packet Take 1 packet by mouth in the morning.     sucralfate (CARAFATE) 1 g tablet Take 1 tablet (1 g total) by mouth 2 (two) times daily. 180 tablet 1   tiZANidine (ZANAFLEX) 4 MG tablet TAKE 1 TABLET 3 TIMES A DAY 270 tablet 0   vitamin C (ASCORBIC ACID) 500 MG tablet Take 500 mg by mouth in the morning.     zinc gluconate 50 MG tablet Take 50 mg by  mouth in the morning.     busPIRone (BUSPAR) 30 MG tablet 1 bid 180 tablet 2   clonazePAM (KLONOPIN) 0.5 MG tablet TAKE 1 TABLET BY MOUTH ONCE DAILY AS NEEDED FOR  ACUTE  ANXIETY. Pt may take 1 extra tablet (0.5 mg) PRN as needed for severe anxiety. 35 tablet 5   desipramine (NORPRAMIN) 25 MG tablet Take 1 tablet (25 mg total) by mouth daily. 90 tablet 3   hydrOXYzine (ATARAX) 50 MG tablet Take 1 tablet (50 mg total) by mouth 3 (three) times daily as needed for anxiety. 270 tablet 2   lamoTRIgine (LAMICTAL) 150 MG tablet Take 1 tablet (150 mg total) by mouth 2 (two) times daily. 180 tablet 1   traZODone (DESYREL) 100 MG tablet Take 1 tablet (100 mg total) by mouth at bedtime as needed and may repeat dose one time if needed for sleep. 60 tablet 5   venlafaxine (EFFEXOR) 100 MG tablet Take 1 tablet (100 mg total) by mouth 3 (three) times daily with meals. 270 tablet 2   No current facility-administered medications for this visit.    Neurologic: Headache: No Seizure: No Paresthesias:NA  Musculoskeletal: Strength & Muscle Tone: within normal limits Gait & Station: normal Patient leans: N/A  Psychiatric Specialty Exam: ROS  Blood pressure (!) 145/84, pulse 80, height 5\' 5"  (1.651 m), weight (!) 365 lb (165.6 kg).Body mass index is 60.74 kg/m.  General Appearance: Bizarre  Eye Contact:  Good  Speech:  Normal Rate  Volume:  Normal  Mood:  Anxious  Affect:  Congruent  Thought Process:  Goal Directed  Orientation:  NA  Thought Content:  Logical  Suicidal Thoughts:  No  Homicidal Thoughts:  No  Memory:  Negative  Judgement:  Fair  Insight:  NA and Good  Psychomotor  Activity:  Normal  Concentration:    Recall:  Good  Fund of Knowledge:  Language: Good  Akathisia:  No  Handed:  Right  AIMS (if indicated):    Assets:  Desire for Improvement  ADL's:  Intact  Cognition: WNL  Sleep:      Treatment Plan Summary:  Today the patient is seen in the office.  This is the first time  in a long time.  She is doing fairly well.  She is diagnosed with clinical depression and takes Effexor and desipramine.  Her second problem is adjustment disorder with an anxious mood state and she takes BuSpar and Vistaril.  Around that her brother we did offer some extra Klonopin and she takes a small dose for this condition.  The patient is functioning reasonably well.  She will be seen again in 4 months.

## 2022-12-13 DIAGNOSIS — Z9884 Bariatric surgery status: Secondary | ICD-10-CM | POA: Diagnosis not present

## 2022-12-13 DIAGNOSIS — K449 Diaphragmatic hernia without obstruction or gangrene: Secondary | ICD-10-CM | POA: Diagnosis not present

## 2022-12-13 DIAGNOSIS — Z6841 Body Mass Index (BMI) 40.0 and over, adult: Secondary | ICD-10-CM | POA: Diagnosis not present

## 2022-12-13 DIAGNOSIS — K219 Gastro-esophageal reflux disease without esophagitis: Secondary | ICD-10-CM | POA: Diagnosis not present

## 2022-12-13 DIAGNOSIS — C220 Liver cell carcinoma: Secondary | ICD-10-CM | POA: Diagnosis not present

## 2022-12-14 ENCOUNTER — Other Ambulatory Visit: Payer: Self-pay

## 2022-12-14 ENCOUNTER — Telehealth: Payer: Self-pay

## 2022-12-14 MED ORDER — NITROFURANTOIN MONOHYD MACRO 100 MG PO CAPS: 100.0000 mg | ORAL_CAPSULE | Freq: Two times a day (BID) | ORAL | 0 refills | Status: DC

## 2022-12-14 NOTE — Telephone Encounter (Signed)
Pt called that she had UTI symptoms,burning  as per lauren send Macrobid for 10 days advised to drink plenty of water and if not feeling better need appt

## 2022-12-15 DIAGNOSIS — M069 Rheumatoid arthritis, unspecified: Secondary | ICD-10-CM | POA: Diagnosis not present

## 2022-12-15 DIAGNOSIS — Z791 Long term (current) use of non-steroidal anti-inflammatories (NSAID): Secondary | ICD-10-CM | POA: Diagnosis not present

## 2022-12-15 DIAGNOSIS — Z09 Encounter for follow-up examination after completed treatment for conditions other than malignant neoplasm: Secondary | ICD-10-CM | POA: Diagnosis not present

## 2022-12-15 DIAGNOSIS — Z6841 Body Mass Index (BMI) 40.0 and over, adult: Secondary | ICD-10-CM | POA: Diagnosis not present

## 2022-12-15 DIAGNOSIS — K219 Gastro-esophageal reflux disease without esophagitis: Secondary | ICD-10-CM | POA: Diagnosis not present

## 2022-12-15 DIAGNOSIS — E039 Hypothyroidism, unspecified: Secondary | ICD-10-CM | POA: Diagnosis not present

## 2022-12-15 DIAGNOSIS — R1084 Generalized abdominal pain: Secondary | ICD-10-CM | POA: Diagnosis not present

## 2022-12-15 DIAGNOSIS — R109 Unspecified abdominal pain: Secondary | ICD-10-CM | POA: Diagnosis not present

## 2022-12-15 DIAGNOSIS — Z8711 Personal history of peptic ulcer disease: Secondary | ICD-10-CM | POA: Diagnosis not present

## 2022-12-15 DIAGNOSIS — Z9884 Bariatric surgery status: Secondary | ICD-10-CM | POA: Diagnosis not present

## 2022-12-15 DIAGNOSIS — Z8719 Personal history of other diseases of the digestive system: Secondary | ICD-10-CM | POA: Diagnosis not present

## 2022-12-15 DIAGNOSIS — G8929 Other chronic pain: Secondary | ICD-10-CM | POA: Diagnosis not present

## 2022-12-15 DIAGNOSIS — Z7982 Long term (current) use of aspirin: Secondary | ICD-10-CM | POA: Diagnosis not present

## 2022-12-15 DIAGNOSIS — Z79890 Hormone replacement therapy (postmenopausal): Secondary | ICD-10-CM | POA: Diagnosis not present

## 2022-12-15 DIAGNOSIS — J45990 Exercise induced bronchospasm: Secondary | ICD-10-CM | POA: Diagnosis not present

## 2022-12-19 ENCOUNTER — Ambulatory Visit: Payer: Medicare HMO | Admitting: Internal Medicine

## 2022-12-19 ENCOUNTER — Encounter: Payer: Self-pay | Admitting: Internal Medicine

## 2022-12-19 VITALS — BP 140/90 | HR 83 | Temp 97.7°F | Resp 16 | Ht 65.0 in | Wt 370.0 lb

## 2022-12-19 DIAGNOSIS — K224 Dyskinesia of esophagus: Secondary | ICD-10-CM | POA: Diagnosis not present

## 2022-12-19 DIAGNOSIS — K219 Gastro-esophageal reflux disease without esophagitis: Secondary | ICD-10-CM

## 2022-12-19 MED ORDER — METOCLOPRAMIDE HCL 5 MG PO TABS
5.0000 mg | ORAL_TABLET | Freq: Every day | ORAL | 4 refills | Status: DC
Start: 2022-12-19 — End: 2022-12-19

## 2022-12-19 MED ORDER — METOCLOPRAMIDE HCL 5 MG PO TABS
5.0000 mg | ORAL_TABLET | Freq: Every day | ORAL | 5 refills | Status: DC
Start: 2022-12-19 — End: 2024-03-07
  Filled 2023-09-17: qty 90, 90d supply, fill #0
  Filled 2023-12-09: qty 90, 90d supply, fill #1
  Filled 2023-12-16: qty 90, 90d supply, fill #2

## 2022-12-19 NOTE — Progress Notes (Signed)
[facility] [address] [place], [place] [address]  Pulmonary Sleep Medicine   Office Visit Note  Patient Name: Jamie Bennett DOB: 03-16-1961 MRN [id number]  Date of Service: 12/19/2022  Complaints/HPI: She continues to have cough and is also having trouble sleeping flat. She does have GERD and also has esophageal dysmotility. She a UGI done on 12/13/22 which shows HH and also reflux. I think these factors are contributing to her symptoms. Will give her a trial of reglan  Office Spirometry Results:     ROS  General: (-) fever, (-) chills, (-) night sweats, (-) weakness Skin: (-) rashes, (-) itching,. Eyes: (-) visual changes, (-) redness, (-) itching. Nose and Sinuses: (-) nasal stuffiness or itchiness, (-) postnasal drip, (-) nosebleeds, (-) sinus trouble. Mouth and Throat: (-) sore throat, (-) hoarseness. Neck: (-) swollen glands, (-) enlarged thyroid, (-) neck pain. Respiratory: + cough, (-) bloody sputum, - shortness of breath, - wheezing. Cardiovascular: - ankle swelling, (-) chest pain. Lymphatic: (-) lymph node enlargement. Neurologic: (-) numbness, (-) tingling. Psychiatric: (-) anxiety, (-) depression   Current Medication: Outpatient Encounter Medications as of 12/19/2022  Medication Sig   acetaminophen (TYLENOL) 500 MG tablet Take 500-1,000 mg by mouth every 6 (six) hours as needed (pain.).   albuterol (VENTOLIN HFA) 108 (90 Base) MCG/ACT inhaler Inhale 2 puffs into the lungs every 6 (six) hours as needed for wheezing or shortness of breath.   benzonatate (TESSALON) 100 MG capsule Take 1-2 tablets 3 times a day as needed for cough   Budeson-Glycopyrrol-Formoterol (BREZTRI AEROSPHERE) 160-9-4.8 MCG/ACT AERO Inhale 2 puffs into the lungs 2 (two) times daily.   busPIRone (BUSPAR) 30 MG tablet 1 bid   Calcium Carbonate (CALCIUM 600 PO) Take 600 mg by mouth in the morning and at bedtime.   chlorpheniramine-HYDROcodone (TUSSIONEX) 10-8 MG/5ML Take 5  mLs by mouth every 12 (twelve) hours as needed for cough.   Cholecalciferol 125 MCG (5000 UT) TABS Take 5,000 Units by mouth in the morning.   clonazePAM (KLONOPIN) 0.5 MG tablet TAKE 1 TABLET BY MOUTH ONCE DAILY AS NEEDED FOR  ACUTE  ANXIETY. Pt may take 1 extra tablet (0.5 mg) PRN as needed for severe anxiety.   Coenzyme Q10 300 MG CAPS Take 300 mg by mouth in the morning.   cyanocobalamin (,VITAMIN B-12,) 1000 MCG/ML injection Inject 1,000 mcg into the muscle every 30 (thirty) days.    denosumab (PROLIA) 60 MG/ML SOSY injection Inject 60 mg into the skin every 6 (six) months.   desipramine (NORPRAMIN) 25 MG tablet Take 1 tablet (25 mg total) by mouth daily.   esomeprazole (NEXIUM) 20 MG capsule Take 20 mg by mouth in the morning.   hydroxychloroquine (PLAQUENIL) 200 MG tablet Take 1 tablet (200 mg total) by mouth 2 (two) times daily.   hydrOXYzine (ATARAX) 50 MG tablet Take 1 tablet (50 mg total) by mouth 3 (three) times daily as needed for anxiety.   ipratropium-albuterol (DUONEB) 0.5-2.5 (3) MG/3ML SOLN Take 3 mLs by nebulization every 4 (four) hours as needed.   Krill Oil 500 MG CAPS Take 500 mg by mouth daily at 12 noon.   lamoTRIgine (LAMICTAL) 150 MG tablet Take 1 tablet (150 mg total) by mouth 2 (two) times daily.   levothyroxine (SYNTHROID) 112 MCG tablet Take 1 tablet (112 mcg total) by mouth daily before breakfast.   loratadine (CLARITIN) 10 MG tablet Take 10 mg by mouth daily as needed for allergies.   magnesium oxide (MAG-OX) 400  MG tablet Take 400 mg by mouth every evening.   Multiple Vitamin (MULTIVITAMIN) tablet Take 1 tablet by mouth every evening.   mupirocin ointment (BACTROBAN) 2 % Apply 1 Application topically 2 (two) times daily.   Nebulizers (COMPRESSOR/NEBULIZER) MISC Dispense 1 machine with associated mask and tubing   nitrofurantoin, macrocrystal-monohydrate, (MACROBID) 100 MG capsule Take 1 capsule (100 mg total) by mouth 2 (two) times daily.   nystatin  (MYCOSTATIN/NYSTOP) powder Apply 1 Application topically 2 (two) times daily.   polyethylene glycol (MIRALAX / GLYCOLAX) 17 g packet Take 17 g by mouth in the morning and at bedtime.   psyllium (METAMUCIL) 58.6 % packet Take 1 packet by mouth in the morning.   sucralfate (CARAFATE) 1 g tablet Take 1 tablet (1 g total) by mouth 2 (two) times daily.   tiZANidine (ZANAFLEX) 4 MG tablet TAKE 1 TABLET 3 TIMES A DAY   traZODone (DESYREL) 100 MG tablet Take 1 tablet (100 mg total) by mouth at bedtime as needed and may repeat dose one time if needed for sleep.   venlafaxine (EFFEXOR) 100 MG tablet Take 1 tablet (100 mg total) by mouth 3 (three) times daily with meals.   vitamin C (ASCORBIC ACID) 500 MG tablet Take 500 mg by mouth in the morning.   zinc gluconate 50 MG tablet Take 50 mg by mouth in the morning.   No facility-administered encounter medications on file as of 12/19/2022.    Surgical History: Past Surgical History:  Procedure Laterality Date   CHOLECYSTECTOMY  2004   COLONOSCOPY N/A 07/16/2017   Procedure: COLONOSCOPY;  Surgeon: Scot Jun, MD;  Location: [facility] ENDOSCOPY;  Service: Endoscopy;  Laterality: N/A;   EXPLORATORY LAPAROTOMY  2009   GASTRIC BYPASS  2003   [facility]   HEEL SPUR EXCISION Bilateral    KNEE ARTHROSCOPY WITH SUBCHONDROPLASTY Left 09/27/2021   Procedure: Left knee subchondroplasty medial tibial plateau;  Surgeon: Kennedy Bucker, MD;  Location: [facility] ORS;  Service: Orthopedics;  Laterality: Left;   LAPAROTOMY N/A 03/01/2018   Procedure: EXPLORATORY LAPAROTOMY/ UMBILECTOMY;  Surgeon: Earline Mayotte, MD;  Location: [facility] ORS;  Service: General;  Laterality: N/A;   TONSILLECTOMY     VENTRAL HERNIA REPAIR N/A 03/07/2018   Procedure: HERNIA REPAIR VENTRAL ADULT;  Surgeon: Earline Mayotte, MD;  Location: [facility] ORS;  Service: General;  Laterality: N/A;    Medical History: Past Medical History:  Diagnosis Date   Anemia    Anxiety    Bipolar 1 disorder  (HCC)    Collagen vascular disease (HCC)    rhematoid arthritis   Constipation    Dyspnea    Fibromyalgia    GERD (gastroesophageal reflux disease)    Heart murmur    mild-asymptomatic   Hepatic steatosis    History of kidney stones    History of Roux-en-Y gastric bypass    Hypotension    Hypothyroidism    Morbid obesity with BMI of 50.0-59.9, adult (HCC)    Opioid abuse (HCC)    Osteoarthritis    Osteoporosis    PONV (postoperative nausea and vomiting)    nausea only   Rheumatic fever    Rheumatoid arthritis (HCC)    Small bowel obstruction (HCC)    Thyroid disease     Family History: Family History  Problem Relation Age of Onset   Depression Mother    Dementia Mother    Heart disease Mother    Osteoporosis Mother    Parkinson's disease Father  Liver disease Brother    Colon cancer Neg Hx     Social History: Social History   Socioeconomic History   Marital status: Married    Spouse name: Not on file   Number of children: Not on file   Years of education: Not on file   Highest education level: Not on file  Occupational History   Not on file  Tobacco Use   Smoking status: Never    Passive exposure: Past   Smokeless tobacco: Never  Vaping Use   Vaping Use: Never used  Substance and Sexual Activity   Alcohol use: Not Currently   Drug use: No   Sexual activity: Yes  Other Topics Concern   Not on file  Social History Narrative   Not on file   Social Determinants of Health   Financial Resource Strain: Not on file  Food Insecurity: Not on file  Transportation Needs: Not on file  Physical Activity: Not on file  Stress: Not on file  Social Connections: Not on file  Intimate Partner Violence: Not on file    Vital Signs: Blood pressure (!) 140/90, pulse 83, temperature 97.7 F (36.5 C), resp. rate 16, height 5\' 5"  (1.651 m), weight (!) 370 lb (167.8 kg), SpO2 96 %.  Examination: General Appearance: The patient is well-developed, well-nourished,  and in no distress. Skin: Gross inspection of skin unremarkable. Head: normocephalic, no gross deformities. Eyes: no gross deformities noted. ENT: ears appear grossly normal no exudates. Neck: Supple. No thyromegaly. No LAD. Respiratory: no rhonchi noted. Cardiovascular: Normal S1 and S2 without murmur or rub. Extremities: No cyanosis. pulses are equal. Neurologic: Alert and oriented. No involuntary movements.  LABS: Recent Results (from the past 2160 hour(s))  Pulmonary Function Test     Status: None   Collection Time: 10/03/22  8:59 AM  Result Value Ref Range   FEV1     FVC     FEV1/FVC     TLC     DLCO      Radiology: CT Soft Tissue Neck W Contrast  Result Date: 08/03/2022 CLINICAL DATA:  Palate weakness, stridor versus wheezing for 1 month, question upper airway etiology EXAM: CT NECK WITH CONTRAST TECHNIQUE: Multidetector CT imaging of the neck was performed using the standard protocol following the bolus administration of intravenous contrast. RADIATION DOSE REDUCTION: This exam was performed according to the departmental dose-optimization program which includes automated exposure control, adjustment of the mA and/or kV according to patient size and/or use of iterative reconstruction technique. CONTRAST:  80mL OMNIPAQUE IOHEXOL 350 MG/ML SOLN COMPARISON:  02/28/2016 CTA neck FINDINGS: Pharynx and larynx: Normal. No mass or swelling. Salivary glands: No inflammation, mass, or stone. Thyroid: Normal. Lymph nodes: None enlarged or abnormal density. Vascular: Patent. Limited intracranial: Negative. Visualized orbits: Negative. Mastoids and visualized paranasal sinuses: Clear. Skeleton: No acute osseous abnormality. Upper chest: No focal pulmonary opacity or pleural effusion. Other: None. IMPRESSION: No acute abnormality in the neck. No etiology is seen for the patient's reported palate weakness or wheezing. Electronically Signed   By: Wiliam Ke M.D.   On: 08/03/2022 19:49   CT Angio  Chest PE W and/or Wo Contrast  Result Date: 08/03/2022 CLINICAL DATA:  History of prior COVID infection with persistent cough and wheezing, initial encounter EXAM: CT ANGIOGRAPHY CHEST WITH CONTRAST TECHNIQUE: Multidetector CT imaging of the chest was performed using the standard protocol during bolus administration of intravenous contrast. Multiplanar CT image reconstructions and MIPs were obtained to evaluate the vascular anatomy.  RADIATION DOSE REDUCTION: This exam was performed according to the departmental dose-optimization program which includes automated exposure control, adjustment of the mA and/or kV according to patient size and/or use of iterative reconstruction technique. CONTRAST:  80mL OMNIPAQUE IOHEXOL 350 MG/ML SOLN COMPARISON:  Chest x-ray from earlier in the same day. FINDINGS: Cardiovascular: Ascending aorta is mildly dilated 4.3 cm. No evidence of dissection is seen. No cardiac enlargement is noted. Mitral annular calcifications are seen. The pulmonary artery shows a normal branching pattern bilaterally. No filling defect to suggest pulmonary embolism is seen. Mediastinum/Nodes: Thoracic inlet is within normal limits. No hilar or mediastinal adenopathy is noted. The esophagus as visualized is within normal limits. Lungs/Pleura: Lungs are well aerated bilaterally. No focal infiltrate or effusion is seen. No parenchymal nodules are seen. Upper Abdomen: Postsurgical changes about the stomach are noted. The remainder of the upper abdomen is within normal limits. Musculoskeletal: Degenerative changes of the thoracic spine are noted. No acute rib abnormality is seen. Review of the MIP images confirms the above findings. IMPRESSION: No evidence of pulmonary emboli. Dilatation of the ascending aorta to 4.3 cm. Recommend annual imaging followup by CTA or MRA. This recommendation follows 2010 ACCF/AHA/AATS/ACR/ASA/SCA/SCAI/SIR/STS/SVM Guidelines for the Diagnosis and Management of Patients with Thoracic  Aortic Disease. Circulation. 2010; 121: Z610-R604. Aortic aneurysm NOS (ICD10-I71.9) Electronically Signed   By: Alcide Clever M.D.   On: 08/03/2022 19:32   DG Chest 2 View  Result Date: 08/03/2022 CLINICAL DATA:  Cough and wheezing EXAM: CHEST - 2 VIEW COMPARISON:  Chest 07/07/2022 FINDINGS: Heart size upper normal. Vascularity normal. Negative for heart failure. Lungs clear without infiltrate effusion Chronic compression fracture lower thoracic spine unchanged. IMPRESSION: No active cardiopulmonary disease. Electronically Signed   By: Marlan Palau M.D.   On: 08/03/2022 15:09    No results found.  No results found.  Assessment and Plan: Patient Active Problem List   Diagnosis Date Noted   Moderate persistent asthma with acute exacerbation 08/03/2022   Chronic knee pain (1ry area of Pain) (Left) 04/19/2022   Tricompartment osteoarthritis of knee (Left) 04/19/2022   Primary osteoarthritis of knee (Left) 04/19/2022   Chronic anticoagulation (Plaquenil) 04/19/2022   History of attempted suicide (07/10/2016) 04/18/2022   History of drug overdose (11/08/2015) 04/18/2022   Chronic pain syndrome 04/17/2022   Pharmacologic therapy 04/17/2022   Disorder of skeletal system 04/17/2022   Problems influencing health status 04/17/2022   Neck pain, musculoskeletal 03/24/2020   Bipolar affective disorder, current episode mixed (HCC) 11/09/2019   Encounter for general adult medical examination with abnormal findings 06/15/2019   Closed fracture of elbow (Left) 06/15/2019   Urinary tract infection without hematuria 06/15/2019   Encounter for long-term (current) use of medications 06/15/2019   Acute upper respiratory infection 06/06/2019   Exposure to COVID-19 virus 06/06/2019   BMI 50.0-59.9, adult (HCC) 06/03/2019   Exercise-induced asthma 12/15/2018   Screening for breast cancer 05/29/2018   Duodenal ulcer disease 05/29/2018   Screening for malignant neoplasm of cervix 05/29/2018   Dysuria  05/29/2018   Surgical wound dehiscence, initial encounter 04/14/2018   Postoperative abdominal hernia with obstruction    Incarcerated ventral hernia 03/22/2018   SBO (small bowel obstruction) (HCC) 03/07/2018   Persistent umbilical sinus 02/22/2018   Atopic dermatitis 12/05/2017   Adjustment disorder with mixed anxiety and depressed mood 03/10/2017   Major depressive disorder 03/10/2017   Primary osteoarthritis of knees (Bilateral) 01/19/2017   Suicide attempt (HCC) 07/10/2016   Fibromyalgia 07/10/2016   High risk  medication use 07/10/2016   Hypotension 11/09/2015   Respiratory failure (HCC)    Drug overdose (11/08/2015) (undetermined intent) 11/08/2015   Fatty infiltration of liver 02/16/2015   Hepatic fibrosis 02/16/2015   Iron deficiency anemia 02/05/2015   Vitamin B 12 deficiency 02/05/2015   OP (osteoporosis) 06/16/2014   Rheumatoid arteritis (HCC) 03/02/2014   Hypothyroidism 03/02/2014   Rheumatic fever without heart involvement 03/02/2014   Adult hypothyroidism 03/02/2014   Arthritis of pelvic region, degenerative 03/02/2014   History of bariatric surgery 11/24/2013    Class: History of   Bipolar affective disorder (HCC) 11/24/2013   Rheumatoid arthritis (HCC) 11/24/2013   Polysubstance (excluding opioids) dependence (HCC) 09/11/2013   Polysubstance dependence (HCC) 09/11/2013   Combined drug dependence excluding opioids (HCC) 09/11/2013   Arthritis or polyarthritis, rheumatoid (HCC) 09/05/2013   Bipolar 1 disorder, depressed (HCC) 09/05/2013    1. Esophageal dysmotilities Significant issues contributing to her symptoms - metoCLOPramide (REGLAN) 5 MG tablet; Take 1 tablet (5 mg total) by mouth at bedtime.  Dispense: 30 tablet; Refill: 4  2. GERD without esophagitis Continue omeprazole as ordered. Avoid late eating and work on weight loss  3. Obesity, morbid (HCC) Obesity Counseling: Had a lengthy discussion regarding patients BMI and weight issues. Patient was  instructed on portion control as well as increased activity. Also discussed caloric restrictions with trying to maintain intake less than 2000 Kcal. Discussions were made in accordance with the 5As of weight management. Simple actions such as not eating late and if able to, taking a walk is suggested.    General Counseling: I have discussed the findings of the evaluation and examination with Jamie Bennett.  I have also discussed any further diagnostic evaluation thatmay be needed or ordered today. Jamie Bennett verbalizes understanding of the findings of todays visit. We also reviewed her medications today and discussed drug interactions and side effects including but not limited excessive drowsiness and altered mental states. We also discussed that there is always a risk not just to her but also people around her. she has been encouraged to call the office with any questions or concerns that should arise related to todays visit.  No orders of the defined types were placed in this encounter.    Time spent: 72  I have personally obtained a history, examined the patient, evaluated laboratory and imaging results, formulated the assessment and plan and placed orders.    Yevonne Pax, MD [facility] Pulmonary and Critical Care Sleep medicine

## 2022-12-20 DIAGNOSIS — E039 Hypothyroidism, unspecified: Secondary | ICD-10-CM | POA: Diagnosis not present

## 2022-12-20 DIAGNOSIS — Z9884 Bariatric surgery status: Secondary | ICD-10-CM | POA: Diagnosis not present

## 2022-12-20 DIAGNOSIS — E559 Vitamin D deficiency, unspecified: Secondary | ICD-10-CM | POA: Diagnosis not present

## 2022-12-20 DIAGNOSIS — M810 Age-related osteoporosis without current pathological fracture: Secondary | ICD-10-CM | POA: Diagnosis not present

## 2022-12-22 ENCOUNTER — Inpatient Hospital Stay: Payer: Medicare HMO | Attending: Oncology

## 2022-12-22 DIAGNOSIS — E538 Deficiency of other specified B group vitamins: Secondary | ICD-10-CM

## 2022-12-22 DIAGNOSIS — D519 Vitamin B12 deficiency anemia, unspecified: Secondary | ICD-10-CM | POA: Diagnosis not present

## 2022-12-22 DIAGNOSIS — Z79899 Other long term (current) drug therapy: Secondary | ICD-10-CM | POA: Insufficient documentation

## 2022-12-22 MED ORDER — CYANOCOBALAMIN 1000 MCG/ML IJ SOLN
1000.0000 ug | Freq: Once | INTRAMUSCULAR | Status: AC
  Administered 2022-12-22: 1000 ug via INTRAMUSCULAR
  Filled 2022-12-22: qty 1

## 2023-01-10 ENCOUNTER — Other Ambulatory Visit (HOSPITAL_COMMUNITY): Payer: Self-pay | Admitting: Psychiatry

## 2023-01-10 DIAGNOSIS — Z1231 Encounter for screening mammogram for malignant neoplasm of breast: Secondary | ICD-10-CM | POA: Diagnosis not present

## 2023-01-10 NOTE — Progress Notes (Deleted)
Office Visit Note  Patient: Jamie Bennett             Date of Birth: January 20, 1961           MRN: [id number]             PCP: Lyndon Code, MD Referring: Lyndon Code, MD Visit Date: 01/24/2023 Occupation: @GUAROCC @  Subjective:  No chief complaint on file.   History of Present Illness: Jamie Bennett is a 62 y.o. female ***     Activities of Daily Living:  Patient reports morning stiffness for *** {minute/hour:19697}.   Patient {ACTIONS;DENIES/REPORTS:21021675::"Denies"} nocturnal pain.  Difficulty dressing/grooming: {ACTIONS;DENIES/REPORTS:21021675::"Denies"} Difficulty climbing stairs: {ACTIONS;DENIES/REPORTS:21021675::"Denies"} Difficulty getting out of chair: {ACTIONS;DENIES/REPORTS:21021675::"Denies"} Difficulty using hands for taps, buttons, cutlery, and/or writing: {ACTIONS;DENIES/REPORTS:21021675::"Denies"}  No Rheumatology ROS completed.   PMFS History:  Patient Active Problem List   Diagnosis Date Noted   Moderate persistent asthma with acute exacerbation 08/03/2022   Chronic knee pain (1ry area of Pain) (Left) 04/19/2022   Tricompartment osteoarthritis of knee (Left) 04/19/2022   Primary osteoarthritis of knee (Left) 04/19/2022   Chronic anticoagulation (Plaquenil) 04/19/2022   History of attempted suicide (07/10/2016) 04/18/2022   History of drug overdose (11/08/2015) 04/18/2022   Chronic pain syndrome 04/17/2022   Pharmacologic therapy 04/17/2022   Disorder of skeletal system 04/17/2022   Problems influencing health status 04/17/2022   Neck pain, musculoskeletal 03/24/2020   Bipolar affective disorder, current episode mixed (HCC) 11/09/2019   Encounter for general adult medical examination with abnormal findings 06/15/2019   Closed fracture of elbow (Left) 06/15/2019   Urinary tract infection without hematuria 06/15/2019   Encounter for long-term (current) use of medications 06/15/2019   Acute upper respiratory infection 06/06/2019   Exposure  to COVID-19 virus 06/06/2019   BMI 50.0-59.9, adult (HCC) 06/03/2019   Exercise-induced asthma 12/15/2018   Screening for breast cancer 05/29/2018   Duodenal ulcer disease 05/29/2018   Screening for malignant neoplasm of cervix 05/29/2018   Dysuria 05/29/2018   Surgical wound dehiscence, initial encounter 04/14/2018   Postoperative abdominal hernia with obstruction    Incarcerated ventral hernia 03/22/2018   SBO (small bowel obstruction) (HCC) 03/07/2018   Persistent umbilical sinus 02/22/2018   Atopic dermatitis 12/05/2017   Adjustment disorder with mixed anxiety and depressed mood 03/10/2017   Major depressive disorder 03/10/2017   Primary osteoarthritis of knees (Bilateral) 01/19/2017   Suicide attempt (HCC) 07/10/2016   Fibromyalgia 07/10/2016   High risk medication use 07/10/2016   Hypotension 11/09/2015   Respiratory failure (HCC)    Drug overdose (11/08/2015) (undetermined intent) 11/08/2015   Fatty infiltration of liver 02/16/2015   Hepatic fibrosis 02/16/2015   Iron deficiency anemia 02/05/2015   Vitamin B 12 deficiency 02/05/2015   OP (osteoporosis) 06/16/2014   Rheumatoid arteritis (HCC) 03/02/2014   Hypothyroidism 03/02/2014   Rheumatic fever without heart involvement 03/02/2014   Adult hypothyroidism 03/02/2014   Arthritis of pelvic region, degenerative 03/02/2014   History of bariatric surgery 11/24/2013    Class: History of   Bipolar affective disorder (HCC) 11/24/2013   Rheumatoid arthritis (HCC) 11/24/2013   Polysubstance (excluding opioids) dependence (HCC) 09/11/2013   Polysubstance dependence (HCC) 09/11/2013   Combined drug dependence excluding opioids (HCC) 09/11/2013   Arthritis or polyarthritis, rheumatoid (HCC) 09/05/2013   Bipolar 1 disorder, depressed (HCC) 09/05/2013    Past Medical History:  Diagnosis Date   Anemia    Anxiety    Bipolar 1 disorder (HCC)    Collagen vascular disease (HCC)  rhematoid arthritis   Constipation    Dyspnea     Fibromyalgia    GERD (gastroesophageal reflux disease)    Heart murmur    mild-asymptomatic   Hepatic steatosis    History of kidney stones    History of Roux-en-Y gastric bypass    Hypotension    Hypothyroidism    Morbid obesity with BMI of 50.0-59.9, adult (HCC)    Opioid abuse (HCC)    Osteoarthritis    Osteoporosis    PONV (postoperative nausea and vomiting)    nausea only   Rheumatic fever    Rheumatoid arthritis (HCC)    Small bowel obstruction (HCC)    Thyroid disease     Family History  Problem Relation Age of Onset   Depression Mother    Dementia Mother    Heart disease Mother    Osteoporosis Mother    Parkinson's disease Father    Liver disease Brother    Colon cancer Neg Hx    Past Surgical History:  Procedure Laterality Date   CHOLECYSTECTOMY  2004   COLONOSCOPY N/A 07/16/2017   Procedure: COLONOSCOPY;  Surgeon: Scot Jun, MD;  Location: [facility] ENDOSCOPY;  Service: Endoscopy;  Laterality: N/A;   EXPLORATORY LAPAROTOMY  2009   GASTRIC BYPASS  2003   [facility]   HEEL SPUR EXCISION Bilateral    KNEE ARTHROSCOPY WITH SUBCHONDROPLASTY Left 09/27/2021   Procedure: Left knee subchondroplasty medial tibial plateau;  Surgeon: Kennedy Bucker, MD;  Location: [facility] ORS;  Service: Orthopedics;  Laterality: Left;   LAPAROTOMY N/A 03/01/2018   Procedure: EXPLORATORY LAPAROTOMY/ UMBILECTOMY;  Surgeon: Earline Mayotte, MD;  Location: [facility] ORS;  Service: General;  Laterality: N/A;   TONSILLECTOMY     VENTRAL HERNIA REPAIR N/A 03/07/2018   Procedure: HERNIA REPAIR VENTRAL ADULT;  Surgeon: Earline Mayotte, MD;  Location: [facility] ORS;  Service: General;  Laterality: N/A;   Social History   Social History Narrative   Not on file   Immunization History  Administered Date(s) Administered   Influenza Inj Mdck Quad Pf 06/09/2022   Influenza, Quadrivalent, Recombinant, Inj, Pf 04/26/2019   Influenza,inj,Quad PF,6+ Mos 04/24/2016   Influenza-Unspecified 04/19/2021    Moderna SARS-COV2 Booster Vaccination 01/30/2021, 05/13/2021   Moderna Sars-Covid-2 Vaccination 10/25/2019, 11/22/2019, 06/02/2020   Pneumococcal Polysaccharide-23 03/02/2018     Objective: Vital Signs: There were no vitals taken for this visit.   Physical Exam   Musculoskeletal Exam: ***  CDAI Exam: CDAI Score: -- Patient Global: --; Provider Global: -- Swollen: --; Tender: -- Joint Exam 01/24/2023   No joint exam has been documented for this visit   There is currently no information documented on the homunculus. Go to the Rheumatology activity and complete the homunculus joint exam.  Investigation: No additional findings.  Imaging: No results found.  Recent Labs: Lab Results  Component Value Date   WBC 6.2 08/03/2022   HGB 11.8 (L) 08/03/2022   PLT 223 08/03/2022   NA 141 08/03/2022   K 3.6 08/03/2022   CL 110 08/03/2022   CO2 21 (L) 08/03/2022   GLUCOSE 126 (H) 08/03/2022   BUN 17 08/03/2022   CREATININE 0.81 08/03/2022   BILITOT 0.3 05/10/2022   ALKPHOS 105 01/10/2022   AST 13 05/10/2022   ALT 9 05/10/2022   PROT 6.7 05/10/2022   ALBUMIN 3.9 01/10/2022   CALCIUM 8.8 (L) 08/03/2022   GFRAA 99 05/26/2020    Speciality Comments: PLQ Eye Exam: 04/24/2022 WNL @ [facility]  Procedures:  No procedures performed Allergies: Lactose intolerance (gi) and Sulfa antibiotics   Assessment / Plan:     Visit Diagnoses: No diagnosis found.  Orders: No orders of the defined types were placed in this encounter.  No orders of the defined types were placed in this encounter.   Face-to-face time spent with patient was *** minutes. Greater than 50% of time was spent in counseling and coordination of care.  Follow-Up Instructions: No follow-ups on file.   Ellen Henri, CMA  Note - This record has been created using Animal nutritionist.  Chart creation errors have been sought, but may not always  have been located. Such creation errors do not reflect on   the standard of medical care.

## 2023-01-11 ENCOUNTER — Other Ambulatory Visit: Payer: Medicare HMO

## 2023-01-11 ENCOUNTER — Ambulatory Visit: Payer: Medicare HMO

## 2023-01-18 DIAGNOSIS — F319 Bipolar disorder, unspecified: Secondary | ICD-10-CM | POA: Diagnosis not present

## 2023-01-18 DIAGNOSIS — Z6841 Body Mass Index (BMI) 40.0 and over, adult: Secondary | ICD-10-CM | POA: Diagnosis not present

## 2023-01-18 DIAGNOSIS — Z9884 Bariatric surgery status: Secondary | ICD-10-CM | POA: Diagnosis not present

## 2023-01-22 ENCOUNTER — Ambulatory Visit: Payer: Medicare HMO

## 2023-01-22 ENCOUNTER — Other Ambulatory Visit: Payer: Medicare HMO

## 2023-01-23 ENCOUNTER — Other Ambulatory Visit: Payer: Self-pay

## 2023-01-23 ENCOUNTER — Inpatient Hospital Stay: Payer: Medicare HMO

## 2023-01-23 ENCOUNTER — Inpatient Hospital Stay: Payer: Medicare HMO | Attending: Oncology

## 2023-01-23 DIAGNOSIS — E538 Deficiency of other specified B group vitamins: Secondary | ICD-10-CM

## 2023-01-23 DIAGNOSIS — D519 Vitamin B12 deficiency anemia, unspecified: Secondary | ICD-10-CM | POA: Insufficient documentation

## 2023-01-23 DIAGNOSIS — Z713 Dietary counseling and surveillance: Secondary | ICD-10-CM | POA: Diagnosis not present

## 2023-01-23 DIAGNOSIS — Z48815 Encounter for surgical aftercare following surgery on the digestive system: Secondary | ICD-10-CM | POA: Diagnosis not present

## 2023-01-23 DIAGNOSIS — Z6841 Body Mass Index (BMI) 40.0 and over, adult: Secondary | ICD-10-CM | POA: Diagnosis not present

## 2023-01-23 DIAGNOSIS — Z79899 Other long term (current) drug therapy: Secondary | ICD-10-CM | POA: Insufficient documentation

## 2023-01-23 DIAGNOSIS — Z9884 Bariatric surgery status: Secondary | ICD-10-CM | POA: Diagnosis not present

## 2023-01-23 LAB — CBC WITH DIFFERENTIAL (CANCER CENTER ONLY)
Abs Immature Granulocytes: 0.02 10*3/uL (ref 0.00–0.07)
Basophils Absolute: 0 10*3/uL (ref 0.0–0.1)
Basophils Relative: 1 %
Eosinophils Absolute: 0.3 10*3/uL (ref 0.0–0.5)
Eosinophils Relative: 6 %
HCT: 38.6 % (ref 36.0–46.0)
Hemoglobin: 12.5 g/dL (ref 12.0–15.0)
Immature Granulocytes: 0 %
Lymphocytes Relative: 31 %
Lymphs Abs: 1.6 10*3/uL (ref 0.7–4.0)
MCH: 29.8 pg (ref 26.0–34.0)
MCHC: 32.4 g/dL (ref 30.0–36.0)
MCV: 91.9 fL (ref 80.0–100.0)
Monocytes Absolute: 0.5 10*3/uL (ref 0.1–1.0)
Monocytes Relative: 10 %
Neutro Abs: 2.8 10*3/uL (ref 1.7–7.7)
Neutrophils Relative %: 52 %
Platelet Count: 213 10*3/uL (ref 150–400)
RBC: 4.2 MIL/uL (ref 3.87–5.11)
RDW: 13.2 % (ref 11.5–15.5)
WBC Count: 5.3 10*3/uL (ref 4.0–10.5)
nRBC: 0 % (ref 0.0–0.2)

## 2023-01-23 LAB — IRON AND TIBC
Iron: 83 ug/dL (ref 28–170)
Saturation Ratios: 20 % (ref 10.4–31.8)
TIBC: 421 ug/dL (ref 250–450)
UIBC: 338 ug/dL

## 2023-01-23 LAB — VITAMIN B12: Vitamin B-12: 565 pg/mL (ref 180–914)

## 2023-01-23 LAB — FERRITIN: Ferritin: 66 ng/mL (ref 11–307)

## 2023-01-23 MED ORDER — CYANOCOBALAMIN 1000 MCG/ML IJ SOLN
1000.0000 ug | Freq: Once | INTRAMUSCULAR | Status: AC
  Administered 2023-01-23: 1000 ug via INTRAMUSCULAR
  Filled 2023-01-23: qty 1

## 2023-01-23 NOTE — Progress Notes (Signed)
Office Visit Note  Patient: Jamie Bennett             Date of Birth: 11-30-1960           MRN: [id number]             PCP: Lyndon Code, MD Referring: Lyndon Code, MD Visit Date: 01/31/2023 Occupation: @GUAROCC @  Subjective:  Pain in multiple joints   History of Present Illness: Jamie Bennett is a 62 y.o. female with history of seropositive rheumatoid arthritis, osteoarthritis, and fibromyalgia. She is taking Plaquenil 200 mg 1 tablet by mouth twice daily.  She is tolerating it without any side effects and has not missed any doses recently.  Patient states that she has been experiencing intermittent sharp pains in both hands.  Her discomfort is consistently in the thumbs.  She has also had intermittent discomfort in the great toes.  She states that in the fall she had a nerve block to help alleviate her left knee joint pain.  She states that she ended up having a fall and subluxed her knee.  She states that she is currently in the process of working on weight loss in order to proceed with a left knee replacement.  She is using a cane to assist with ambulation.  She is taking Tylenol, ibuprofen, and turmeric to alleviate her discomfort.  Patient states she remains on Prolia 60 mg subcu injections every 6 months for osteoporosis.  She is also taking a calcium and vitamin D supplement daily.     Activities of Daily Living:  Patient reports morning stiffness for 30 minutes.   Patient Reports nocturnal pain.  Difficulty dressing/grooming: Denies Difficulty climbing stairs: Reports Difficulty getting out of chair: Reports Difficulty using hands for taps, buttons, cutlery, and/or writing: Denies  Review of Systems  Constitutional:  Positive for fatigue.  HENT:  Negative for mouth sores and mouth dryness.   Eyes:  Positive for dryness.  Respiratory:  Positive for shortness of breath.   Cardiovascular:  Negative for chest pain and palpitations.  Gastrointestinal:  Positive  for constipation. Negative for blood in stool and diarrhea.  Endocrine: Negative for increased urination.  Genitourinary:  Negative for involuntary urination.  Musculoskeletal:  Positive for joint pain, joint pain, joint swelling, myalgias, morning stiffness, muscle tenderness and myalgias. Negative for gait problem and muscle weakness.  Skin:  Negative for color change, rash, hair loss and sensitivity to sunlight.  Allergic/Immunologic: Positive for susceptible to infections.  Neurological:  Positive for dizziness and headaches.  Hematological:  Positive for swollen glands.  Psychiatric/Behavioral:  Positive for depressed mood. Negative for sleep disturbance. The patient is nervous/anxious.     PMFS History:  Patient Active Problem List   Diagnosis Date Noted   Moderate persistent asthma with acute exacerbation 08/03/2022   Chronic knee pain (1ry area of Pain) (Left) 04/19/2022   Tricompartment osteoarthritis of knee (Left) 04/19/2022   Primary osteoarthritis of knee (Left) 04/19/2022   Chronic anticoagulation (Plaquenil) 04/19/2022   History of attempted suicide (07/10/2016) 04/18/2022   History of drug overdose (11/08/2015) 04/18/2022   Chronic pain syndrome 04/17/2022   Pharmacologic therapy 04/17/2022   Disorder of skeletal system 04/17/2022   Problems influencing health status 04/17/2022   Neck pain, musculoskeletal 03/24/2020   Bipolar affective disorder, current episode mixed (HCC) 11/09/2019   Encounter for general adult medical examination with abnormal findings 06/15/2019   Closed fracture of elbow (Left) 06/15/2019   Urinary tract infection without hematuria 06/15/2019  Encounter for long-term (current) use of medications 06/15/2019   Acute upper respiratory infection 06/06/2019   Exposure to COVID-19 virus 06/06/2019   BMI 50.0-59.9, adult (HCC) 06/03/2019   Exercise-induced asthma 12/15/2018   Screening for breast cancer 05/29/2018   Duodenal ulcer disease 05/29/2018    Screening for malignant neoplasm of cervix 05/29/2018   Dysuria 05/29/2018   Surgical wound dehiscence, initial encounter 04/14/2018   Postoperative abdominal hernia with obstruction    Incarcerated ventral hernia 03/22/2018   SBO (small bowel obstruction) (HCC) 03/07/2018   Persistent umbilical sinus 02/22/2018   Atopic dermatitis 12/05/2017   Adjustment disorder with mixed anxiety and depressed mood 03/10/2017   Major depressive disorder 03/10/2017   Primary osteoarthritis of knees (Bilateral) 01/19/2017   Suicide attempt (HCC) 07/10/2016   Fibromyalgia 07/10/2016   High risk medication use 07/10/2016   Hypotension 11/09/2015   Respiratory failure (HCC)    Drug overdose (11/08/2015) (undetermined intent) 11/08/2015   Fatty infiltration of liver 02/16/2015   Hepatic fibrosis 02/16/2015   Iron deficiency anemia 02/05/2015   Vitamin B 12 deficiency 02/05/2015   OP (osteoporosis) 06/16/2014   Rheumatoid arteritis (HCC) 03/02/2014   Hypothyroidism 03/02/2014   Rheumatic fever without heart involvement 03/02/2014   Adult hypothyroidism 03/02/2014   Arthritis of pelvic region, degenerative 03/02/2014   History of bariatric surgery 11/24/2013    Class: History of   Bipolar affective disorder (HCC) 11/24/2013   Rheumatoid arthritis (HCC) 11/24/2013   Polysubstance (excluding opioids) dependence (HCC) 09/11/2013   Polysubstance dependence (HCC) 09/11/2013   Combined drug dependence excluding opioids (HCC) 09/11/2013   Arthritis or polyarthritis, rheumatoid (HCC) 09/05/2013   Bipolar 1 disorder, depressed (HCC) 09/05/2013    Past Medical History:  Diagnosis Date   Anemia    Anxiety    Bipolar 1 disorder (HCC)    Collagen vascular disease (HCC)    rhematoid arthritis   Constipation    Dyspnea    Fibromyalgia    GERD (gastroesophageal reflux disease)    Heart murmur    mild-asymptomatic   Hepatic steatosis    History of kidney stones    History of Roux-en-Y gastric bypass     Hypotension    Hypothyroidism    Morbid obesity with BMI of 50.0-59.9, adult (HCC)    Opioid abuse (HCC)    Osteoarthritis    Osteoporosis    PONV (postoperative nausea and vomiting)    nausea only   Rheumatic fever    Rheumatoid arthritis (HCC)    Small bowel obstruction (HCC)    Thyroid disease     Family History  Problem Relation Age of Onset   Depression Mother    Dementia Mother    Heart disease Mother    Osteoporosis Mother    Parkinson's disease Father    Liver disease Brother    Colon cancer Neg Hx    Past Surgical History:  Procedure Laterality Date   CHOLECYSTECTOMY  2004   COLONOSCOPY N/A 07/16/2017   Procedure: COLONOSCOPY;  Surgeon: Scot Jun, MD;  Location: [facility] ENDOSCOPY;  Service: Endoscopy;  Laterality: N/A;   EXPLORATORY LAPAROTOMY  2009   GASTRIC BYPASS  2003   [facility]   HEEL SPUR EXCISION Bilateral    KNEE ARTHROSCOPY WITH SUBCHONDROPLASTY Left 09/27/2021   Procedure: Left knee subchondroplasty medial tibial plateau;  Surgeon: Kennedy Bucker, MD;  Location: [facility] ORS;  Service: Orthopedics;  Laterality: Left;   LAPAROTOMY N/A 03/01/2018   Procedure: EXPLORATORY LAPAROTOMY/ UMBILECTOMY;  Surgeon: Earline Mayotte,  MD;  Location: [facility] ORS;  Service: General;  Laterality: N/A;   TONSILLECTOMY     VENTRAL HERNIA REPAIR N/A 03/07/2018   Procedure: HERNIA REPAIR VENTRAL ADULT;  Surgeon: Earline Mayotte, MD;  Location: [facility] ORS;  Service: General;  Laterality: N/A;   Social History   Social History Narrative   Not on file   Immunization History  Administered Date(s) Administered   Influenza Inj Mdck Quad Pf 06/09/2022   Influenza, Quadrivalent, Recombinant, Inj, Pf 04/26/2019   Influenza,inj,Quad PF,6+ Mos 04/24/2016   Influenza-Unspecified 04/19/2021   Moderna SARS-COV2 Booster Vaccination 01/30/2021, 05/13/2021   Moderna Sars-Covid-2 Vaccination 10/25/2019, 11/22/2019, 06/02/2020   Pneumococcal Polysaccharide-23 03/02/2018      Objective: Vital Signs: BP 115/65 (BP Location: Left Arm, Patient Position: Sitting, Cuff Size: Normal)   Pulse 75   Resp 16   Ht 5' 4.5" (1.638 m)   Wt (!) 350 lb (158.8 kg)   BMI 59.15 kg/m    Physical Exam Vitals and nursing note reviewed.  Constitutional:      Appearance: She is well-developed.  HENT:     Head: Normocephalic and atraumatic.  Eyes:     Conjunctiva/sclera: Conjunctivae normal.  Cardiovascular:     Rate and Rhythm: Normal rate and regular rhythm.     Heart sounds: Normal heart sounds.  Pulmonary:     Effort: Pulmonary effort is normal.     Breath sounds: Normal breath sounds.  Abdominal:     General: Bowel sounds are normal.     Palpations: Abdomen is soft.  Musculoskeletal:     Cervical back: Normal range of motion.  Lymphadenopathy:     Cervical: No cervical adenopathy.  Skin:    General: Skin is warm and dry.     Capillary Refill: Capillary refill takes less than 2 seconds.  Neurological:     Mental Status: She is alert and oriented to person, place, and time.  Psychiatric:        Behavior: Behavior normal.      Musculoskeletal Exam: Patient remained seated during the examination today.  C-spine has slightly limited range of motion with lateral rotation.  Trapezius muscle tension tenderness bilaterally.  Shoulder joints have good range of motion with no discomfort.  Tenderness over the medial epicondyle of the left elbow.  No tenderness along the elbow joint line.  Wrist joints, MCPs, PIPs, DIPs have good range of motion with no synovitis.  Complete fist formation bilaterally.  Painful range of motion of both knee joints.  Ankle joints have good range of motion with no joint tenderness. Pedal edema noted bilaterally.   CDAI Exam: CDAI Score: -- Patient Global: 50 / 100; Provider Global: 20 / 100 Swollen: --; Tender: -- Joint Exam 01/31/2023   No joint exam has been documented for this visit   There is currently no information documented on  the homunculus. Go to the Rheumatology activity and complete the homunculus joint exam.  Investigation: No additional findings.  Imaging: No results found.  Recent Labs: Lab Results  Component Value Date   WBC 5.3 01/23/2023   HGB 12.5 01/23/2023   PLT 213 01/23/2023   NA 141 08/03/2022   K 3.6 08/03/2022   CL 110 08/03/2022   CO2 21 (L) 08/03/2022   GLUCOSE 126 (H) 08/03/2022   BUN 17 08/03/2022   CREATININE 0.81 08/03/2022   BILITOT 0.3 05/10/2022   ALKPHOS 105 01/10/2022   AST 13 05/10/2022   ALT 9 05/10/2022   PROT 6.7 05/10/2022  ALBUMIN 3.9 01/10/2022   CALCIUM 8.8 (L) 08/03/2022   GFRAA 99 05/26/2020    Speciality Comments: PLQ Eye Exam: 04/24/2022 WNL @ [facility]  Procedures:  No procedures performed Allergies: Lactose intolerance (gi) and Sulfa antibiotics   Assessment / Plan:     Visit Diagnoses: Rheumatoid arthritis involving multiple sites with positive rheumatoid factor (HCC): No synovitis was noted on examination today.  Patient experiences intermittent discomfort in both hands due to underlying osteoarthritis.  Discussed the importance of joint protection and muscle strengthening.  Also discussed the use of turmeric, tart cherry, ginger, and omega-3 for the natural anti-inflammatory properties.  Patient continues to take Tylenol and ibuprofen for pain relief.  She is also taking Plaquenil 200 mg 1 tablet by mouth twice daily.  She continues to tolerate Plaquenil without any side effects.  Her rheumatoid arthritis remains well-controlled taking Plaquenil as prescribed.  She was advised to notify us if she develops signs or symptoms of a flare.  Follow-up in the office in 5 months or sooner if needed.  High risk medication use - Plaquenil 200 mg 1 tablet by mouth twice daily.  PLQ Eye Exam: 04/24/2022 WNL @ [facility]  CBC updated on 01/23/23.  CMP updated today. - Plan: COMPLETE METABOLIC PANEL WITH GFR  Primary osteoarthritis of both hands:  She has PIP and DIP thickening consistent with osteoarthritis of both hands.  Patient continues to experience intermittent fleeting pains in both hands due to underlying osteoarthritis.  No tenderness or synovitis over MCP joints noted today.  Complete fist formation bilaterally.  She has started taking turmeric and has continued Tylenol and ibuprofen as needed for pain relief.  Discussed the importance of joint protection and muscle strengthening.  Discussed the use of arthritis compression gloves as well as paraffin wax.  Trochanteric bursitis of left hip: Intermittent discomfort.   Primary osteoarthritis of both knees - S/p orthovisc bilateral knees 08/2020-09/2020. She underwent a left knee subchondroplasty of the medial tibial plateau on 09/27/21 performed by Dr. Rosita Kea. Chronic pain especially in the left knee.  Using a cane to assist with ambulation.  Patient had a nerve block performed in fall 2023.  She is currently working with the [facility] weight loss clinic in order to lose weight to proceed with a left knee replacement in the future.  DDD (degenerative disc disease), cervical: C-spine has slightly limited range of motion with lateral rotation.  Trapezius muscle tension and tenderness bilaterally.  DDD (degenerative disc disease), thoracic  DDD (degenerative disc disease), lumbar: Difficult to assess range of motion in seated position.  Other osteoporosis without current pathological fracture - She remains on Prolia 60 mg sq injections every 6 months prescribed by her endocrinologist.  She is also taking calcium and vitamin D supplement daily.  Fibromyalgia: She continues to experience intermittent myalgias and muscle tenderness due to fibromyalgia.  According to the patient she has 2-3 flares per year.  She experiences intermittent bouts of fatigue.  She has trapezius muscle tension and tenderness bilaterally.  She presents today with medial epicondylitis of the left elbow.  She was given a  handout of exercises to perform.  She takes tizanidine 4 mg 3 times daily for muscle spasms and has been taking ibuprofen and Tylenol for pain relief.  Medial epicondylitis of left elbow: Tenderness over the medial epicondyle of the left elbow noted on examination today.  Discussed the diagnosis of medial epicondylitis.  Different treatment options were discussed.  She was given a  handout of exercises to perform.  Trapezius muscle spasm: She has trapezius muscle tension and tenderness bilaterally.  She takes Zanaflex 4 mg 1 tablet 3 times daily as needed for muscle spasms.  Other medical conditions are listed as follows:  History of gastroesophageal reflux (GERD)  History of bipolar disorder  History of anemia  History of hypothyroidism  Orders: Orders Placed This Encounter  Procedures   COMPLETE METABOLIC PANEL WITH GFR   No orders of the defined types were placed in this encounter.  Follow-Up Instructions: Return in about 5 months (around 07/03/2023) for Rheumatoid arthritis, Fibromyalgia, Osteoarthritis.   Gearldine Bienenstock, PA-C  Note - This record has been created using Dragon software.  Chart creation errors have been sought, but may not always  have been located. Such creation errors do not reflect on  the standard of medical care.

## 2023-01-24 ENCOUNTER — Ambulatory Visit: Payer: Medicare HMO | Admitting: Rheumatology

## 2023-01-24 DIAGNOSIS — Z8639 Personal history of other endocrine, nutritional and metabolic disease: Secondary | ICD-10-CM

## 2023-01-24 DIAGNOSIS — M7062 Trochanteric bursitis, left hip: Secondary | ICD-10-CM

## 2023-01-24 DIAGNOSIS — M5134 Other intervertebral disc degeneration, thoracic region: Secondary | ICD-10-CM

## 2023-01-24 DIAGNOSIS — M5136 Other intervertebral disc degeneration, lumbar region: Secondary | ICD-10-CM

## 2023-01-24 DIAGNOSIS — Z8719 Personal history of other diseases of the digestive system: Secondary | ICD-10-CM

## 2023-01-24 DIAGNOSIS — Z862 Personal history of diseases of the blood and blood-forming organs and certain disorders involving the immune mechanism: Secondary | ICD-10-CM

## 2023-01-24 DIAGNOSIS — M5030 Other cervical disc degeneration, unspecified cervical region: Secondary | ICD-10-CM

## 2023-01-24 DIAGNOSIS — Z8659 Personal history of other mental and behavioral disorders: Secondary | ICD-10-CM

## 2023-01-24 DIAGNOSIS — Z79899 Other long term (current) drug therapy: Secondary | ICD-10-CM

## 2023-01-24 DIAGNOSIS — M170 Bilateral primary osteoarthritis of knee: Secondary | ICD-10-CM

## 2023-01-24 DIAGNOSIS — M797 Fibromyalgia: Secondary | ICD-10-CM

## 2023-01-24 DIAGNOSIS — M818 Other osteoporosis without current pathological fracture: Secondary | ICD-10-CM

## 2023-01-24 DIAGNOSIS — M62838 Other muscle spasm: Secondary | ICD-10-CM

## 2023-01-24 DIAGNOSIS — M19042 Primary osteoarthritis, left hand: Secondary | ICD-10-CM

## 2023-01-24 DIAGNOSIS — M0579 Rheumatoid arthritis with rheumatoid factor of multiple sites without organ or systems involvement: Secondary | ICD-10-CM

## 2023-01-30 DIAGNOSIS — F32A Depression, unspecified: Secondary | ICD-10-CM | POA: Diagnosis not present

## 2023-01-31 ENCOUNTER — Encounter: Payer: Self-pay | Admitting: Physician Assistant

## 2023-01-31 ENCOUNTER — Ambulatory Visit: Payer: Medicare HMO | Attending: Rheumatology | Admitting: Physician Assistant

## 2023-01-31 VITALS — BP 115/65 | HR 75 | Resp 16 | Ht 64.5 in | Wt 350.0 lb

## 2023-01-31 DIAGNOSIS — M5136 Other intervertebral disc degeneration, lumbar region: Secondary | ICD-10-CM

## 2023-01-31 DIAGNOSIS — M5030 Other cervical disc degeneration, unspecified cervical region: Secondary | ICD-10-CM | POA: Diagnosis not present

## 2023-01-31 DIAGNOSIS — M170 Bilateral primary osteoarthritis of knee: Secondary | ICD-10-CM

## 2023-01-31 DIAGNOSIS — M7062 Trochanteric bursitis, left hip: Secondary | ICD-10-CM

## 2023-01-31 DIAGNOSIS — M818 Other osteoporosis without current pathological fracture: Secondary | ICD-10-CM

## 2023-01-31 DIAGNOSIS — M797 Fibromyalgia: Secondary | ICD-10-CM

## 2023-01-31 DIAGNOSIS — M7702 Medial epicondylitis, left elbow: Secondary | ICD-10-CM

## 2023-01-31 DIAGNOSIS — Z79899 Other long term (current) drug therapy: Secondary | ICD-10-CM | POA: Diagnosis not present

## 2023-01-31 DIAGNOSIS — M0579 Rheumatoid arthritis with rheumatoid factor of multiple sites without organ or systems involvement: Secondary | ICD-10-CM

## 2023-01-31 DIAGNOSIS — Z8659 Personal history of other mental and behavioral disorders: Secondary | ICD-10-CM

## 2023-01-31 DIAGNOSIS — M62838 Other muscle spasm: Secondary | ICD-10-CM

## 2023-01-31 DIAGNOSIS — M19042 Primary osteoarthritis, left hand: Secondary | ICD-10-CM

## 2023-01-31 DIAGNOSIS — M5134 Other intervertebral disc degeneration, thoracic region: Secondary | ICD-10-CM

## 2023-01-31 DIAGNOSIS — Z862 Personal history of diseases of the blood and blood-forming organs and certain disorders involving the immune mechanism: Secondary | ICD-10-CM

## 2023-01-31 DIAGNOSIS — M19041 Primary osteoarthritis, right hand: Secondary | ICD-10-CM

## 2023-01-31 DIAGNOSIS — Z8639 Personal history of other endocrine, nutritional and metabolic disease: Secondary | ICD-10-CM

## 2023-01-31 DIAGNOSIS — Z8719 Personal history of other diseases of the digestive system: Secondary | ICD-10-CM

## 2023-01-31 NOTE — Patient Instructions (Signed)
[facility] Ask your health care provider which exercises are safe for you. Do exercises exactly as told by your health care provider and adjust them as directed. It is normal to feel mild stretching, pulling, tightness, or discomfort as you do these exercises. Stop right away if you feel sudden pain or your pain gets worse. Do not begin these exercises until told by your health care provider. Stretching and range-of-motion exercises These exercises warm up your muscles and joints and improve the movement and flexibility of your elbow. Wrist extension, assisted  Straighten your left / right elbow in front of you with your palm facing up toward the ceiling. If told by your health care provider, bend your left / right elbow to a 90-degree angle (right angle) at your side instead of holding it straight. With your other hand, gently pull your left / right hand and fingers toward the floor (extension). Stop when you feel a gentle stretch on the palm side of your forearm. Hold this position for __________ seconds. Repeat __________ times. Complete this exercise __________ times a day. Wrist flexion, assisted  Straighten your left / right elbow in front of you with your palm facing down toward the floor. If told by your health care provider, bend your left / right elbow to a 90-degree angle (right angle) at your side instead of holding it straight. With your other hand, gently push over the back of your left / right hand so your fingers point toward the floor (flexion). Stop when you feel a gentle stretch on the back of your forearm. Hold this position for __________ seconds. Repeat __________ times. Complete this exercise __________ times a day. Assisted forearm rotation, supination Sit or stand with your elbows at your side. Bend your left / right elbow to a 90-degree angle (right angle). Using your uninjured hand, turn your left / right palm up toward the ceiling (supination) until you feel  a gentle stretch along the inside of your forearm. Hold this position for __________ seconds. Repeat __________ times. Complete this exercise __________ times a day. Assisted forearm rotation, pronation Sit or stand with your elbows at your side. Bend your left / right elbow to a 90-degree angle (right angle). Using your uninjured hand, turn your left / right palm down toward the floor (pronation) until you feel a gentle stretch along the top of your forearm. Hold this position for __________ seconds. Repeat __________ times. Complete this exercise __________ times a day. Strengthening exercises These exercises build strength and endurance in your elbow. Endurance is the ability to use your muscles for a long time, even after they get tired. Wrist flexion  Sit with your left / right forearm supported on a table or other surface and your palm turned up toward the ceiling. Let your left / right wrist extend over the edge of the surface. Hold a __________ weight or a piece of rubber exercise band or tubing. If using a rubber exercise band or tubing, hold the other end of the tubing with your other hand. Slowly bend your wrist so your hand moves up toward the ceiling (flexion). Try to only move your wrist and keep the rest of your arm still. Hold this position for __________ seconds. Slowly return to the starting position. Repeat __________ times. Complete this exercise __________ times a day. Wrist flexion, eccentric Sit with your left / right forearm palm-up and supported on a table or other surface. Let your left / right wrist extend over the  edge of the surface. Hold a __________ weight or a piece of rubber exercise band or tubing in your left / right hand. If using a rubber exercise band or tubing, hold the other end of the tubing with your other hand. Use your uninjured hand to move your left / right hand up toward the ceiling. Take your uninjured hand away and slowly return to the  starting position using only your left / right hand (eccentric flexion). Repeat __________ times. Complete this exercise __________ times a day. Forearm rotation, pronation To do this exercise, you will need a lightweight hammer or rubber mallet. Sit with your left / right forearm supported on a table or other surface. Bend your elbow to a 90-degree angle (right angle). Position your forearm so that your palm is facing up toward the ceiling, with your hand resting over the edge of the table. Hold a hammer in your left / right hand. To make this exercise easier, hold the hammer near the head of the hammer. To make this exercise harder, hold the hammer near the end of the handle. Without moving your elbow, slowly turn (rotate) your forearm so your palm faces down toward the floor (pronation). Hold this position for __________ seconds. Slowly return to the starting position. Repeat __________ times. Complete this exercise __________ times a day. Shoulder blade squeeze Sit in a stable chair or stand with good posture. If you are sitting down, do not let your back touch the back of the chair. Your arms should be at your sides with your elbows bent to a 90-degree angle (right angle). Position your forearms so that your thumbs are facing the ceiling (neutral position). Without lifting your shoulders up, squeeze your shoulder blades tightly together. Hold this position for __________ seconds. Slowly release and return to the starting position. Repeat __________ times. Complete this exercise __________ times a day. This information is not intended to replace advice given to you by your health care provider. Make sure you discuss any questions you have with your health care provider. Document Revised: 10/08/2019 Document Reviewed: 10/08/2019 Elsevier Patient Education  2024 [employer].

## 2023-02-01 LAB — COMPLETE METABOLIC PANEL WITH GFR
AG Ratio: 1.8 (calc) (ref 1.0–2.5)
ALT: 13 U/L (ref 6–29)
AST: 18 U/L (ref 10–35)
Albumin: 4.4 g/dL (ref 3.6–5.1)
Alkaline phosphatase (APISO): 97 U/L (ref 37–153)
BUN/Creatinine Ratio: 32 (calc) — ABNORMAL HIGH (ref 6–22)
BUN: 29 mg/dL — ABNORMAL HIGH (ref 7–25)
CO2: 22 mmol/L (ref 20–32)
Calcium: 9.7 mg/dL (ref 8.6–10.4)
Chloride: 108 mmol/L (ref 98–110)
Creat: 0.9 mg/dL (ref 0.50–1.05)
Globulin: 2.5 g/dL (calc) (ref 1.9–3.7)
Glucose, Bld: 94 mg/dL (ref 65–99)
Potassium: 4 mmol/L (ref 3.5–5.3)
Sodium: 141 mmol/L (ref 135–146)
Total Bilirubin: 0.4 mg/dL (ref 0.2–1.2)
Total Protein: 6.9 g/dL (ref 6.1–8.1)
eGFR: 72 mL/min/{1.73_m2} (ref >=60)

## 2023-02-01 NOTE — Progress Notes (Signed)
CMP stable

## 2023-02-05 ENCOUNTER — Telehealth: Payer: Self-pay

## 2023-02-05 NOTE — Telephone Encounter (Signed)
Pt called that she on donut whole gave her 3 samples for [employer]

## 2023-02-12 ENCOUNTER — Inpatient Hospital Stay: Payer: Medicare HMO

## 2023-02-19 ENCOUNTER — Other Ambulatory Visit: Payer: Self-pay | Admitting: Physician Assistant

## 2023-02-19 ENCOUNTER — Other Ambulatory Visit: Payer: Self-pay | Admitting: Nurse Practitioner

## 2023-02-19 DIAGNOSIS — G8929 Other chronic pain: Secondary | ICD-10-CM

## 2023-02-19 DIAGNOSIS — M0579 Rheumatoid arthritis with rheumatoid factor of multiple sites without organ or systems involvement: Secondary | ICD-10-CM

## 2023-02-19 NOTE — Telephone Encounter (Signed)
Last Fill: 12/04/2022  Eye exam: 04/24/2022 WNL   Labs: 01/23/2023 CBC WNL, 01/31/2023 BUN 29, BUN/Creat. Ratio 32  Next Visit: 07/03/2023  Last Visit: 01/31/2023  DX: Rheumatoid arthritis involving multiple sites with positive rheumatoid factor   Current Dose per office note 01/31/2023: Plaquenil 200 mg 1 tablet by mouth twice daily.   Okay to refill Plaquenil?

## 2023-02-20 ENCOUNTER — Inpatient Hospital Stay: Payer: Medicare HMO | Attending: Oncology

## 2023-02-20 DIAGNOSIS — E538 Deficiency of other specified B group vitamins: Secondary | ICD-10-CM

## 2023-02-20 DIAGNOSIS — D519 Vitamin B12 deficiency anemia, unspecified: Secondary | ICD-10-CM | POA: Diagnosis not present

## 2023-02-20 MED ORDER — CYANOCOBALAMIN 1000 MCG/ML IJ SOLN
1000.0000 ug | Freq: Once | INTRAMUSCULAR | Status: AC
  Administered 2023-02-20: 1000 ug via INTRAMUSCULAR
  Filled 2023-02-20: qty 1

## 2023-03-13 ENCOUNTER — Encounter: Payer: Self-pay | Admitting: Nurse Practitioner

## 2023-03-13 ENCOUNTER — Ambulatory Visit (INDEPENDENT_AMBULATORY_CARE_PROVIDER_SITE_OTHER): Payer: Medicare HMO | Admitting: Nurse Practitioner

## 2023-03-13 VITALS — BP 118/80 | HR 70 | Temp 98.2°F | Resp 16 | Ht 64.5 in | Wt 348.2 lb

## 2023-03-13 DIAGNOSIS — N3001 Acute cystitis with hematuria: Secondary | ICD-10-CM | POA: Diagnosis not present

## 2023-03-13 DIAGNOSIS — N390 Urinary tract infection, site not specified: Secondary | ICD-10-CM | POA: Diagnosis not present

## 2023-03-13 DIAGNOSIS — M810 Age-related osteoporosis without current pathological fracture: Secondary | ICD-10-CM | POA: Diagnosis not present

## 2023-03-13 DIAGNOSIS — R300 Dysuria: Secondary | ICD-10-CM | POA: Diagnosis not present

## 2023-03-13 LAB — POCT URINALYSIS DIPSTICK
Bilirubin, UA: NEGATIVE
Glucose, UA: NEGATIVE
Nitrite, UA: NEGATIVE
Protein, UA: NEGATIVE
Spec Grav, UA: 1.015 (ref 1.010–1.025)
Urobilinogen, UA: 0.2 E.U./dL
pH, UA: 5 (ref 5.0–8.0)

## 2023-03-13 MED ORDER — CIPROFLOXACIN HCL 500 MG PO TABS
500.0000 mg | ORAL_TABLET | Freq: Two times a day (BID) | ORAL | 0 refills | Status: AC
Start: 2023-03-13 — End: 2023-03-18

## 2023-03-13 NOTE — Progress Notes (Signed)
[facility] [address] [place], [place] [address]  Internal MEDICINE  Office Visit Note  Patient Name: Jamie Bennett  [id number]  [id number]  Date of Service: 03/13/2023  Chief Complaint  Patient presents with   Acute Visit    uti     HPI Jamie Bennett presents for an acute sick visit for symptoms of UTI.  --onset of symptoms was Friday  Reports urgency, frequency, burning with urination, dysuria  In office urinalysis showed leukocytes and trace blood.       Current Medication:  Outpatient Encounter Medications as of 03/13/2023  Medication Sig   acetaminophen (TYLENOL) 500 MG tablet Take 500-1,000 mg by mouth every 6 (six) hours as needed (pain.).   albuterol (VENTOLIN HFA) 108 (90 Base) MCG/ACT inhaler Inhale 2 puffs into the lungs every 6 (six) hours as needed for wheezing or shortness of breath.   benzonatate (TESSALON) 100 MG capsule Take 1-2 tablets 3 times a day as needed for cough   Budeson-Glycopyrrol-Formoterol (BREZTRI AEROSPHERE) 160-9-4.8 MCG/ACT AERO Inhale 2 puffs into the lungs 2 (two) times daily.   busPIRone (BUSPAR) 30 MG tablet 1 bid   Calcium Carbonate (CALCIUM 600 PO) Take 600 mg by mouth in the morning and at bedtime.   chlorpheniramine-HYDROcodone (TUSSIONEX) 10-8 MG/5ML Take 5 mLs by mouth every 12 (twelve) hours as needed for cough.   Cholecalciferol 125 MCG (5000 UT) TABS Take 5,000 Units by mouth in the morning.   ciprofloxacin (CIPRO) 500 MG tablet Take 1 tablet (500 mg total) by mouth 2 (two) times daily for 5 days. Take with food.   clonazePAM (KLONOPIN) 0.5 MG tablet TAKE 1 TABLET BY MOUTH ONCE DAILY AS NEEDED FOR  ACUTE  ANXIETY. Pt may take 1 extra tablet (0.5 mg) PRN as needed for severe anxiety.   Coenzyme Q10 300 MG CAPS Take 300 mg by mouth in the morning.   cyanocobalamin (,VITAMIN B-12,) 1000 MCG/ML injection Inject 1,000 mcg into the muscle every 30 (thirty) days.    denosumab (PROLIA) 60 MG/ML SOSY injection Inject 60 mg  into the skin every 6 (six) months.   desipramine (NORPRAMIN) 25 MG tablet Take 1 tablet (25 mg total) by mouth daily.   esomeprazole (NEXIUM) 20 MG capsule Take 20 mg by mouth in the morning.   hydroxychloroquine (PLAQUENIL) 200 MG tablet TAKE 1 TABLET TWICE A DAY   hydrOXYzine (ATARAX) 50 MG tablet Take 1 tablet (50 mg total) by mouth 3 (three) times daily as needed for anxiety.   ipratropium-albuterol (DUONEB) 0.5-2.5 (3) MG/3ML SOLN Take 3 mLs by nebulization every 4 (four) hours as needed.   Krill Oil 500 MG CAPS Take 500 mg by mouth daily at 12 noon.   lamoTRIgine (LAMICTAL) 150 MG tablet Take 1 tablet (150 mg total) by mouth 2 (two) times daily.   levothyroxine (SYNTHROID) 112 MCG tablet Take 1 tablet (112 mcg total) by mouth daily before breakfast.   loratadine (CLARITIN) 10 MG tablet Take 10 mg by mouth daily as needed for allergies.   magnesium oxide (MAG-OX) 400 MG tablet Take 400 mg by mouth every evening.   metoCLOPramide (REGLAN) 5 MG tablet Take 1 tablet (5 mg total) by mouth at bedtime.   Multiple Vitamin (MULTIVITAMIN) tablet Take 1 tablet by mouth every evening.   mupirocin ointment (BACTROBAN) 2 % Apply 1 Application topically 2 (two) times daily.   Nebulizers (COMPRESSOR/NEBULIZER) MISC Dispense 1 machine with associated mask and tubing   nitrofurantoin, macrocrystal-monohydrate, (MACROBID) 100 MG capsule Take  1 capsule (100 mg total) by mouth 2 (two) times daily.   nystatin (MYCOSTATIN/NYSTOP) powder Apply 1 Application topically 2 (two) times daily.   polyethylene glycol (MIRALAX / GLYCOLAX) 17 g packet Take 17 g by mouth in the morning and at bedtime.   psyllium (METAMUCIL) 58.6 % packet Take 1 packet by mouth in the morning.   sucralfate (CARAFATE) 1 g tablet Take 1 tablet (1 g total) by mouth 2 (two) times daily.   tiZANidine (ZANAFLEX) 4 MG tablet TAKE 1 TABLET 3 TIMES A DAY   traZODone (DESYREL) 100 MG tablet Take 1 tablet (100 mg total) by mouth at bedtime as needed  and may repeat dose one time if needed for sleep.   venlafaxine (EFFEXOR) 100 MG tablet Take 1 tablet (100 mg total) by mouth 3 (three) times daily with meals.   vitamin C (ASCORBIC ACID) 500 MG tablet Take 500 mg by mouth in the morning.   zinc gluconate 50 MG tablet Take 50 mg by mouth in the morning.   No facility-administered encounter medications on file as of 03/13/2023.      Medical History: Past Medical History:  Diagnosis Date   Anemia    Anxiety    Bipolar 1 disorder (HCC)    Collagen vascular disease (HCC)    rhematoid arthritis   Constipation    Dyspnea    Fibromyalgia    GERD (gastroesophageal reflux disease)    Heart murmur    mild-asymptomatic   Hepatic steatosis    History of kidney stones    History of Roux-en-Y gastric bypass    Hypotension    Hypothyroidism    Morbid obesity with BMI of 50.0-59.9, adult (HCC)    Opioid abuse (HCC)    Osteoarthritis    Osteoporosis    PONV (postoperative nausea and vomiting)    nausea only   Rheumatic fever    Rheumatoid arthritis (HCC)    Small bowel obstruction (HCC)    Thyroid disease      Vital Signs: BP 118/80   Pulse 70   Temp 98.2 F (36.8 C)   Resp 16   Ht 5' 4.5" (1.638 m)   Wt (!) 348 lb 3.2 oz (157.9 kg)   SpO2 95%   BMI 58.85 kg/m    Review of Systems  Constitutional:  Negative for fatigue and fever.  HENT:  Negative for congestion, mouth sores and postnasal drip.   Respiratory:  Negative for cough.   Cardiovascular:  Negative for chest pain.  Genitourinary:  Positive for difficulty urinating, dysuria, hematuria and urgency. Negative for flank pain.  Psychiatric/Behavioral: Negative.      Physical Exam Vitals and nursing note reviewed.  Constitutional:      General: She is not in acute distress.    Appearance: She is well-developed. She is obese. She is not diaphoretic.  HENT:     Head: Normocephalic and atraumatic.     Mouth/Throat:     Pharynx: No oropharyngeal exudate.  Eyes:      Pupils: Pupils are equal, round, and reactive to light.  Neck:     Thyroid: No thyromegaly.     Vascular: No JVD.     Trachea: No tracheal deviation.  Cardiovascular:     Rate and Rhythm: Normal rate and regular rhythm.     Heart sounds: Normal heart sounds. No murmur heard.    No friction rub. No gallop.  Pulmonary:     Effort: Pulmonary effort is normal. No respiratory distress.  Breath sounds: No wheezing or rales.  Chest:     Chest wall: No tenderness.  Abdominal:     General: Bowel sounds are normal.     Palpations: Abdomen is soft.  Musculoskeletal:        General: Normal range of motion.     Cervical back: Normal range of motion and neck supple.  Lymphadenopathy:     Cervical: No cervical adenopathy.  Skin:    General: Skin is warm and dry.  Neurological:     Mental Status: She is alert and oriented to person, place, and time.     Cranial Nerves: No cranial nerve deficit.  Psychiatric:        Behavior: Behavior normal.        Thought Content: Thought content normal.        Judgment: Judgment normal.       Assessment/Plan: 1. Acute cystitis with hematuria Urine culture sent, empiric treatment with cipro ordered.  - CULTURE, URINE COMPREHENSIVE - ciprofloxacin (CIPRO) 500 MG tablet; Take 1 tablet (500 mg total) by mouth 2 (two) times daily for 5 days. Take with food.  Dispense: 10 tablet; Refill: 0  2. Dysuria Urinalysis showed blood and leukocytes in urine, urine culture sent - POCT urinalysis dipstick - CULTURE, URINE COMPREHENSIVE   General Counseling: Paytan verbalizes understanding of the findings of todays visit and agrees with plan of treatment. I have discussed any further diagnostic evaluation that may be needed or ordered today. We also reviewed her medications today. she has been encouraged to call the office with any questions or concerns that should arise related to todays visit.    Counseling:    Orders Placed This Encounter  Procedures    CULTURE, URINE COMPREHENSIVE   POCT urinalysis dipstick    Meds ordered this encounter  Medications   ciprofloxacin (CIPRO) 500 MG tablet    Sig: Take 1 tablet (500 mg total) by mouth 2 (two) times daily for 5 days. Take with food.    Dispense:  10 tablet    Refill:  0    Return if symptoms worsen or fail to improve.  [place] Controlled Substance Database was reviewed by me for overdose risk score (ORS)  Time spent:30 Minutes Time spent with patient included reviewing progress notes, labs, imaging studies, and discussing plan for follow up.   This patient was seen by Sallyanne Kuster, FNP-C in collaboration with Dr. Beverely Risen as a part of collaborative care agreement.   R. Tedd Sias, MSN, FNP-C Internal Medicine

## 2023-03-15 ENCOUNTER — Inpatient Hospital Stay: Payer: Medicare HMO

## 2023-03-23 ENCOUNTER — Inpatient Hospital Stay: Payer: Medicare HMO | Attending: Oncology

## 2023-03-23 DIAGNOSIS — E538 Deficiency of other specified B group vitamins: Secondary | ICD-10-CM | POA: Insufficient documentation

## 2023-03-23 MED ORDER — CYANOCOBALAMIN 1000 MCG/ML IJ SOLN
1000.0000 ug | Freq: Once | INTRAMUSCULAR | Status: AC
  Administered 2023-03-23: 1000 ug via INTRAMUSCULAR
  Filled 2023-03-23: qty 1

## 2023-03-28 DIAGNOSIS — Z9884 Bariatric surgery status: Secondary | ICD-10-CM | POA: Diagnosis not present

## 2023-03-28 DIAGNOSIS — Z713 Dietary counseling and surveillance: Secondary | ICD-10-CM | POA: Diagnosis not present

## 2023-03-28 DIAGNOSIS — K912 Postsurgical malabsorption, not elsewhere classified: Secondary | ICD-10-CM | POA: Diagnosis not present

## 2023-03-28 DIAGNOSIS — Z6841 Body Mass Index (BMI) 40.0 and over, adult: Secondary | ICD-10-CM | POA: Diagnosis not present

## 2023-04-04 ENCOUNTER — Other Ambulatory Visit: Payer: Self-pay

## 2023-04-04 ENCOUNTER — Telehealth: Payer: Self-pay

## 2023-04-04 MED ORDER — NITROFURANTOIN MONOHYD MACRO 100 MG PO CAPS: 100.0000 mg | ORAL_CAPSULE | Freq: Two times a day (BID) | ORAL | 0 refills | Status: DC

## 2023-04-04 NOTE — Telephone Encounter (Signed)
Pt called that she finished antibiotic for UTI and its not cleared still having UTI symptoms as per alyssa sent 7 days macrobid and advised if she is not feeling better need appt

## 2023-04-14 ENCOUNTER — Other Ambulatory Visit: Payer: Self-pay | Admitting: Physician Assistant

## 2023-04-14 DIAGNOSIS — E039 Hypothyroidism, unspecified: Secondary | ICD-10-CM

## 2023-04-16 ENCOUNTER — Inpatient Hospital Stay: Payer: Medicare HMO

## 2023-04-17 ENCOUNTER — Ambulatory Visit (HOSPITAL_COMMUNITY): Payer: Medicare HMO | Admitting: Psychiatry

## 2023-04-23 ENCOUNTER — Inpatient Hospital Stay: Payer: Medicare HMO | Attending: Oncology

## 2023-04-23 DIAGNOSIS — E538 Deficiency of other specified B group vitamins: Secondary | ICD-10-CM | POA: Diagnosis not present

## 2023-04-23 MED ORDER — CYANOCOBALAMIN 1000 MCG/ML IJ SOLN
1000.0000 ug | Freq: Once | INTRAMUSCULAR | Status: AC
  Administered 2023-04-23: 1000 ug via INTRAMUSCULAR
  Filled 2023-04-23: qty 1

## 2023-04-24 ENCOUNTER — Ambulatory Visit (HOSPITAL_BASED_OUTPATIENT_CLINIC_OR_DEPARTMENT_OTHER): Payer: Medicare HMO | Admitting: Psychiatry

## 2023-04-24 DIAGNOSIS — F3342 Major depressive disorder, recurrent, in full remission: Secondary | ICD-10-CM | POA: Diagnosis not present

## 2023-04-24 DIAGNOSIS — F411 Generalized anxiety disorder: Secondary | ICD-10-CM

## 2023-04-24 DIAGNOSIS — F603 Borderline personality disorder: Secondary | ICD-10-CM

## 2023-04-24 DIAGNOSIS — F332 Major depressive disorder, recurrent severe without psychotic features: Secondary | ICD-10-CM | POA: Diagnosis not present

## 2023-04-24 MED ORDER — HYDROXYZINE HCL 50 MG PO TABS: 50.0000 mg | ORAL_TABLET | Freq: Three times a day (TID) | ORAL | 2 refills | Status: DC | PRN

## 2023-04-24 MED ORDER — LAMOTRIGINE 150 MG PO TABS: 150.0000 mg | ORAL_TABLET | Freq: Two times a day (BID) | ORAL | 1 refills | Status: DC

## 2023-04-24 MED ORDER — DESIPRAMINE HCL 25 MG PO TABS: 25.0000 mg | ORAL_TABLET | Freq: Every day | ORAL | 3 refills | Status: DC

## 2023-04-24 MED ORDER — VENLAFAXINE HCL 100 MG PO TABS
100.0000 mg | ORAL_TABLET | Freq: Three times a day (TID) | ORAL | 2 refills | Status: DC
Start: 2023-04-24 — End: 2023-04-25

## 2023-04-24 NOTE — Progress Notes (Signed)
`  Psychiatric Initial Adult Assessment   Patient Identification: Jamie Bennett MRN:  [id number] Date of Evaluation:  04/24/2023 Referral Source: grams per previous psychiatrist Chief Complaint:     Today's date is 04/24/2023.  Today the patient is stable.  Her brother died and she is actually dealing with some legal issues about a house that he was supposed to handle.  Patient feels a lot of stress about this house.  His intensity.  The patient unfortunately does not have any family members that she trusts.  She has a good relationship with her husband.  The patient denies daily depression.  She mainly has anxiety about the situation.  She actually is sleeping and eating well.  She no longer takes trazodone.  The patient denies persistent overwhelming depression.  She has no evidence of psychosis.  She drinks no alcohol and uses no drugs.  She is handling stress actually pretty well. Visit Diagnosis: borderline personality disorder   ICD-10-CM   1. Borderline personality disorder (HCC)  F60.3 venlafaxine (EFFEXOR) 100 MG tablet    lamoTRIgine (LAMICTAL) 150 MG tablet    2. Anxiety state  F41.1 venlafaxine (EFFEXOR) 100 MG tablet    hydrOXYzine (ATARAX) 50 MG tablet    3. Major depressive disorder, recurrent, severe without psychotic features (HCC)  F33.2 venlafaxine (EFFEXOR) 100 MG tablet    lamoTRIgine (LAMICTAL) 150 MG tablet       History of Pre Depression Symptoms:  fatigue, (Hypo) Manic Symptoms:   Anxiety Symptoms:   Psychotic Symptoms:   PTSD Symptoms:   Past Psychiatric History: 10 psychiatric hospitalizations multiple psychotropic medications presently in psychotherapy  Previous Psychotropic Medications: Yes   Substance Abuse History in the last 12 months:  Yes.    Consequences of Substance Abuse:   Past Medical History:  Past Medical History:  Diagnosis Date   Anemia    Anxiety    Bipolar 1 disorder (HCC)    Collagen vascular disease (HCC)    rhematoid  arthritis   Constipation    Dyspnea    Fibromyalgia    GERD (gastroesophageal reflux disease)    Heart murmur    mild-asymptomatic   Hepatic steatosis    History of kidney stones    History of Roux-en-Y gastric bypass    Hypotension    Hypothyroidism    Morbid obesity with BMI of 50.0-59.9, adult (HCC)    Opioid abuse (HCC)    Osteoarthritis    Osteoporosis    PONV (postoperative nausea and vomiting)    nausea only   Rheumatic fever    Rheumatoid arthritis (HCC)    Small bowel obstruction (HCC)    Thyroid disease     Past Surgical History:  Procedure Laterality Date   CHOLECYSTECTOMY  2004   COLONOSCOPY N/A 07/16/2017   Procedure: COLONOSCOPY;  Surgeon: Scot Jun, MD;  Location: [facility] ENDOSCOPY;  Service: Endoscopy;  Laterality: N/A;   EXPLORATORY LAPAROTOMY  2009   GASTRIC BYPASS  2003   [facility]   HEEL SPUR EXCISION Bilateral    KNEE ARTHROSCOPY WITH SUBCHONDROPLASTY Left 09/27/2021   Procedure: Left knee subchondroplasty medial tibial plateau;  Surgeon: Kennedy Bucker, MD;  Location: [facility] ORS;  Service: Orthopedics;  Laterality: Left;   LAPAROTOMY N/A 03/01/2018   Procedure: EXPLORATORY LAPAROTOMY/ UMBILECTOMY;  Surgeon: Earline Mayotte, MD;  Location: [facility] ORS;  Service: General;  Laterality: N/A;   TONSILLECTOMY     VENTRAL HERNIA REPAIR N/A 03/07/2018   Procedure: HERNIA REPAIR VENTRAL ADULT;  Surgeon: Earline Mayotte, MD;  Location: [facility] ORS;  Service: General;  Laterality: N/A;    Family Psychiatric History:   Family History:  Family History  Problem Relation Age of Onset   Depression Mother    Dementia Mother    Heart disease Mother    Osteoporosis Mother    Parkinson's disease Father    Liver disease Brother    Colon cancer Neg Hx     Social History:   Social History   Socioeconomic History   Marital status: Married    Spouse name: Not on file   Number of children: Not on file   Years of education: Not on file   Highest  education level: Not on file  Occupational History   Not on file  Tobacco Use   Smoking status: Never    Passive exposure: Past   Smokeless tobacco: Never  Vaping Use   Vaping status: Never Used  Substance and Sexual Activity   Alcohol use: Not Currently   Drug use: No   Sexual activity: Yes  Other Topics Concern   Not on file  Social History Narrative   Not on file   Social Determinants of Health   Financial Resource Strain: Low Risk  (12/13/2022)   Received from [facility], [facility]   Overall Financial Resource Strain (CARDIA)    Difficulty of Paying Living Expenses: Not hard at all  Food Insecurity: No Food Insecurity (12/13/2022)   Received from [facility], [facility]   Hunger Vital Sign    Worried About Running Out of Food in the Last Year: Never true    Ran Out of Food in the Last Year: Never true  Transportation Needs: No Transportation Needs (12/13/2022)   Received from [facility], [facility]   [facility] - Transportation    In the past 12 months, has lack of transportation kept you from medical appointments or from getting medications?: No    Lack of Transportation (Non-Medical): No  Physical Activity: Insufficiently Active (12/13/2022)   Received from [facility], [facility]   Exercise Vital Sign    Days of Exercise per Week: 1 day    Minutes of Exercise per Session: 10 min  Stress: Stress Concern Present (12/13/2022)   Received from [facility], [facility]   [employer] [facility] - Occupational Stress Questionnaire    Feeling of Stress : To some extent  Social Connections: Socially Isolated (12/13/2022)   Received from [facility], [facility]   Social Connection and Isolation Panel [NHANES]    Frequency of Communication with  Friends and Family: Never    Frequency of Social Gatherings with Friends and Family: Never    Attends Religious Services: Never    Database administrator or Organizations: No    Attends Banker Meetings: Never    Marital Status: Married    Additional Social History:   Allergies:   Allergies  Allergen Reactions   Lactose Intolerance (Gi) Other (See Comments)    Bloating and GI distress   Sulfa Antibiotics Hives    Metabolic Disorder Labs: Lab Results  Component Value Date   HGBA1C 5.0 09/01/2021   No results found for: "PROLACTIN" Lab Results  Component Value Date   CHOL 169 05/26/2020   TRIG 136 05/26/2020   HDL 73 05/26/2020   LDLCALC 73  05/26/2020     Current Medications: Current Outpatient Medications  Medication Sig Dispense Refill   acetaminophen (TYLENOL) 500 MG tablet Take 500-1,000 mg by mouth every 6 (six) hours as needed (pain.).     albuterol (VENTOLIN HFA) 108 (90 Base) MCG/ACT inhaler Inhale 2 puffs into the lungs every 6 (six) hours as needed for wheezing or shortness of breath. 8 g 3   benzonatate (TESSALON) 100 MG capsule Take 1-2 tablets 3 times a day as needed for cough 30 capsule 0   Budeson-Glycopyrrol-Formoterol (BREZTRI AEROSPHERE) 160-9-4.8 MCG/ACT AERO Inhale 2 puffs into the lungs 2 (two) times daily. 10.7 g 11   busPIRone (BUSPAR) 30 MG tablet 1 bid 180 tablet 2   Calcium Carbonate (CALCIUM 600 PO) Take 600 mg by mouth in the morning and at bedtime.     chlorpheniramine-HYDROcodone (TUSSIONEX) 10-8 MG/5ML Take 5 mLs by mouth every 12 (twelve) hours as needed for cough. 140 mL 0   Cholecalciferol 125 MCG (5000 UT) TABS Take 5,000 Units by mouth in the morning.     clonazePAM (KLONOPIN) 0.5 MG tablet TAKE 1 TABLET BY MOUTH ONCE DAILY AS NEEDED FOR  ACUTE  ANXIETY. Pt may take 1 extra tablet (0.5 mg) PRN as needed for severe anxiety. 35 tablet 5   Coenzyme Q10 300 MG CAPS Take 300 mg by mouth in the morning.     cyanocobalamin  (,VITAMIN B-12,) 1000 MCG/ML injection Inject 1,000 mcg into the muscle every 30 (thirty) days.      denosumab (PROLIA) 60 MG/ML SOSY injection Inject 60 mg into the skin every 6 (six) months.     desipramine (NORPRAMIN) 25 MG tablet Take 1 tablet (25 mg total) by mouth daily. 90 tablet 3   esomeprazole (NEXIUM) 20 MG capsule Take 20 mg by mouth in the morning.     hydroxychloroquine (PLAQUENIL) 200 MG tablet TAKE 1 TABLET TWICE A DAY 180 tablet 0   hydrOXYzine (ATARAX) 50 MG tablet Take 1 tablet (50 mg total) by mouth 3 (three) times daily as needed for anxiety. 270 tablet 2   ipratropium-albuterol (DUONEB) 0.5-2.5 (3) MG/3ML SOLN Take 3 mLs by nebulization every 4 (four) hours as needed. 360 mL 1   Krill Oil 500 MG CAPS Take 500 mg by mouth daily at 12 noon.     lamoTRIgine (LAMICTAL) 150 MG tablet Take 1 tablet (150 mg total) by mouth 2 (two) times daily. 180 tablet 1   levothyroxine (SYNTHROID) 112 MCG tablet TAKE 1 TABLET BY MOUTH ONCE DAILY IN THE MORNING BEFORE BREAKFAST 90 tablet 0   loratadine (CLARITIN) 10 MG tablet Take 10 mg by mouth daily as needed for allergies.     magnesium oxide (MAG-OX) 400 MG tablet Take 400 mg by mouth every evening.     metoCLOPramide (REGLAN) 5 MG tablet Take 1 tablet (5 mg total) by mouth at bedtime. 90 tablet 5   Multiple Vitamin (MULTIVITAMIN) tablet Take 1 tablet by mouth every evening.     mupirocin ointment (BACTROBAN) 2 % Apply 1 Application topically 2 (two) times daily. 30 g 3   Nebulizers (COMPRESSOR/NEBULIZER) MISC Dispense 1 machine with associated mask and tubing 1 each 0   nitrofurantoin, macrocrystal-monohydrate, (MACROBID) 100 MG capsule Take 1 capsule (100 mg total) by mouth 2 (two) times daily. 14 capsule 0   nystatin (MYCOSTATIN/NYSTOP) powder Apply 1 Application topically 2 (two) times daily. 30 g 6   polyethylene glycol (MIRALAX / GLYCOLAX) 17 g packet Take 17 g by mouth in  the morning and at bedtime.     psyllium (METAMUCIL) 58.6 %  packet Take 1 packet by mouth in the morning.     sucralfate (CARAFATE) 1 g tablet Take 1 tablet (1 g total) by mouth 2 (two) times daily. 180 tablet 1   tiZANidine (ZANAFLEX) 4 MG tablet TAKE 1 TABLET 3 TIMES A DAY 270 tablet 0   venlafaxine (EFFEXOR) 100 MG tablet Take 1 tablet (100 mg total) by mouth 3 (three) times daily with meals. 270 tablet 2   vitamin C (ASCORBIC ACID) 500 MG tablet Take 500 mg by mouth in the morning.     zinc gluconate 50 MG tablet Take 50 mg by mouth in the morning.     No current facility-administered medications for this visit.    Neurologic: Headache: No Seizure: No Paresthesias:NA  Musculoskeletal: Strength & Muscle Tone: within normal limits Gait & Station: normal Patient leans: N/A  Psychiatric Specialty Exam: ROS  There were no vitals taken for this visit.There is no height or weight on file to calculate BMI.  General Appearance: Bizarre  Eye Contact:  Good  Speech:  Normal Rate  Volume:  Normal  Mood:  Anxious  Affect:  Congruent  Thought Process:  Goal Directed  Orientation:  NA  Thought Content:  Logical  Suicidal Thoughts:  No  Homicidal Thoughts:  No  Memory:  Negative  Judgement:  Fair  Insight:  NA and Good  Psychomotor Activity:  Normal  Concentration:    Recall:  Good  Fund of Knowledge:  Language: Good  Akathisia:  No  Handed:  Right  AIMS (if indicated):    Assets:  Desire for Improvement  ADL's:  Intact  Cognition: WNL  Sleep:      Treatment Plan Summary:  This patient's diagnosis is major clinical depression.  She takes Effexor and desipramine for this.  Her second problem is adjustment disorder with an anxious mood state.  She takes BuSpar and Vistaril for this condition.  The patient is functioning actually pretty well.  I believe she is stable.  She will return to see me in 3 months.

## 2023-04-25 ENCOUNTER — Other Ambulatory Visit (HOSPITAL_COMMUNITY): Payer: Self-pay

## 2023-04-25 DIAGNOSIS — F411 Generalized anxiety disorder: Secondary | ICD-10-CM

## 2023-04-25 DIAGNOSIS — F332 Major depressive disorder, recurrent severe without psychotic features: Secondary | ICD-10-CM

## 2023-04-25 DIAGNOSIS — F603 Borderline personality disorder: Secondary | ICD-10-CM

## 2023-04-25 MED ORDER — HYDROXYZINE HCL 50 MG PO TABS: 50.0000 mg | ORAL_TABLET | Freq: Three times a day (TID) | ORAL | 2 refills | Status: DC | PRN

## 2023-04-25 MED ORDER — VENLAFAXINE HCL 100 MG PO TABS: 100.0000 mg | ORAL_TABLET | Freq: Three times a day (TID) | ORAL | 2 refills | Status: DC

## 2023-04-25 MED ORDER — BUSPIRONE HCL 30 MG PO TABS: ORAL_TABLET | ORAL | 2 refills | Status: DC

## 2023-04-25 MED ORDER — LAMOTRIGINE 150 MG PO TABS: 150.0000 mg | ORAL_TABLET | Freq: Two times a day (BID) | ORAL | 1 refills | Status: DC

## 2023-04-25 MED ORDER — CLONAZEPAM 0.5 MG PO TABS: ORAL_TABLET | ORAL | 5 refills | Status: DC

## 2023-04-25 MED ORDER — DESIPRAMINE HCL 25 MG PO TABS: 25.0000 mg | ORAL_TABLET | Freq: Every day | ORAL | 3 refills | Status: DC

## 2023-05-15 ENCOUNTER — Encounter: Payer: Self-pay | Admitting: Internal Medicine

## 2023-05-15 ENCOUNTER — Ambulatory Visit: Payer: Medicare HMO | Admitting: Internal Medicine

## 2023-05-15 VITALS — BP 145/90 | HR 75 | Temp 97.6°F | Resp 16 | Ht 64.5 in | Wt 343.8 lb

## 2023-05-15 DIAGNOSIS — R053 Chronic cough: Secondary | ICD-10-CM | POA: Diagnosis not present

## 2023-05-15 DIAGNOSIS — K219 Gastro-esophageal reflux disease without esophagitis: Secondary | ICD-10-CM

## 2023-05-15 MED ORDER — OMEPRAZOLE 40 MG PO CPDR
40.0000 mg | DELAYED_RELEASE_CAPSULE | Freq: Every day | ORAL | 3 refills | Status: DC
Start: 2023-05-15 — End: 2023-12-13

## 2023-05-15 NOTE — Patient Instructions (Signed)

## 2023-05-15 NOTE — Progress Notes (Signed)
[facility] [address] [place], [place] [address]  Pulmonary Sleep Medicine   Office Visit Note  Patient Name: Jamie Bennett DOB: 1960/11/21 MRN [id number]  Date of Service: 05/15/2023  Complaints/HPI: She is still having cough although a little better. Unfortunately she has not been able to lose weight and her insurance has denies weight loss medication. She is not a diabetic. She also had a gastric bypass and initially lost weight but has since gained it back and more.  Spoke to her about the importance of maintenance of a appropriate diet to help keep her weight down.  Patient needs to work on this and try to get her weight down once again  Office Spirometry Results:     ROS  General: (-) fever, (-) chills, (-) night sweats, (-) weakness Skin: (-) rashes, (-) itching,. Eyes: (-) visual changes, (-) redness, (-) itching. Nose and Sinuses: (-) nasal stuffiness or itchiness, (-) postnasal drip, (-) nosebleeds, (-) sinus trouble. Mouth and Throat: (-) sore throat, (-) hoarseness. Neck: (-) swollen glands, (-) enlarged thyroid, (-) neck pain. Respiratory: + cough, (-) bloody sputum, - shortness of breath, - wheezing. Cardiovascular: - ankle swelling, (-) chest pain. Lymphatic: (-) lymph node enlargement. Neurologic: (-) numbness, (-) tingling. Psychiatric: (-) anxiety, (-) depression   Current Medication: Outpatient Encounter Medications as of 05/15/2023  Medication Sig   acetaminophen (TYLENOL) 500 MG tablet Take 500-1,000 mg by mouth every 6 (six) hours as needed (pain.).   albuterol (VENTOLIN HFA) 108 (90 Base) MCG/ACT inhaler Inhale 2 puffs into the lungs every 6 (six) hours as needed for wheezing or shortness of breath.   benzonatate (TESSALON) 100 MG capsule Take 1-2 tablets 3 times a day as needed for cough   Budeson-Glycopyrrol-Formoterol (BREZTRI AEROSPHERE) 160-9-4.8 MCG/ACT AERO Inhale 2 puffs into the lungs 2 (two) times daily.   busPIRone  (BUSPAR) 30 MG tablet 1 bid   Calcium Carbonate (CALCIUM 600 PO) Take 600 mg by mouth in the morning and at bedtime.   chlorpheniramine-HYDROcodone (TUSSIONEX) 10-8 MG/5ML Take 5 mLs by mouth every 12 (twelve) hours as needed for cough.   Cholecalciferol 125 MCG (5000 UT) TABS Take 5,000 Units by mouth in the morning.   clonazePAM (KLONOPIN) 0.5 MG tablet TAKE 1 TABLET BY MOUTH ONCE DAILY AS NEEDED FOR  ACUTE  ANXIETY. Pt may take 1 extra tablet (0.5 mg) PRN as needed for severe anxiety.   Coenzyme Q10 300 MG CAPS Take 300 mg by mouth in the morning.   cyanocobalamin (,VITAMIN B-12,) 1000 MCG/ML injection Inject 1,000 mcg into the muscle every 30 (thirty) days.    denosumab (PROLIA) 60 MG/ML SOSY injection Inject 60 mg into the skin every 6 (six) months.   desipramine (NORPRAMIN) 25 MG tablet Take 1 tablet (25 mg total) by mouth daily.   esomeprazole (NEXIUM) 20 MG capsule Take 20 mg by mouth in the morning.   hydroxychloroquine (PLAQUENIL) 200 MG tablet TAKE 1 TABLET TWICE A DAY   hydrOXYzine (ATARAX) 50 MG tablet Take 1 tablet (50 mg total) by mouth 3 (three) times daily as needed for anxiety.   ipratropium-albuterol (DUONEB) 0.5-2.5 (3) MG/3ML SOLN Take 3 mLs by nebulization every 4 (four) hours as needed.   Krill Oil 500 MG CAPS Take 500 mg by mouth daily at 12 noon.   lamoTRIgine (LAMICTAL) 150 MG tablet Take 1 tablet (150 mg total) by mouth 2 (two) times daily.   levothyroxine (SYNTHROID) 112 MCG tablet TAKE 1 TABLET BY MOUTH  ONCE DAILY IN THE MORNING BEFORE BREAKFAST   loratadine (CLARITIN) 10 MG tablet Take 10 mg by mouth daily as needed for allergies.   magnesium oxide (MAG-OX) 400 MG tablet Take 400 mg by mouth every evening.   metoCLOPramide (REGLAN) 5 MG tablet Take 1 tablet (5 mg total) by mouth at bedtime.   Multiple Vitamin (MULTIVITAMIN) tablet Take 1 tablet by mouth every evening.   mupirocin ointment (BACTROBAN) 2 % Apply 1 Application topically 2 (two) times daily.    Nebulizers (COMPRESSOR/NEBULIZER) MISC Dispense 1 machine with associated mask and tubing   nitrofurantoin, macrocrystal-monohydrate, (MACROBID) 100 MG capsule Take 1 capsule (100 mg total) by mouth 2 (two) times daily.   nystatin (MYCOSTATIN/NYSTOP) powder Apply 1 Application topically 2 (two) times daily.   polyethylene glycol (MIRALAX / GLYCOLAX) 17 g packet Take 17 g by mouth in the morning and at bedtime.   psyllium (METAMUCIL) 58.6 % packet Take 1 packet by mouth in the morning.   sucralfate (CARAFATE) 1 g tablet Take 1 tablet (1 g total) by mouth 2 (two) times daily.   tiZANidine (ZANAFLEX) 4 MG tablet TAKE 1 TABLET 3 TIMES A DAY   venlafaxine (EFFEXOR) 100 MG tablet Take 1 tablet (100 mg total) by mouth 3 (three) times daily with meals.   vitamin C (ASCORBIC ACID) 500 MG tablet Take 500 mg by mouth in the morning.   zinc gluconate 50 MG tablet Take 50 mg by mouth in the morning.   No facility-administered encounter medications on file as of 05/15/2023.    Surgical History: Past Surgical History:  Procedure Laterality Date   CHOLECYSTECTOMY  2004   COLONOSCOPY N/A 07/16/2017   Procedure: COLONOSCOPY;  Surgeon: Scot Jun, MD;  Location: [facility] ENDOSCOPY;  Service: Endoscopy;  Laterality: N/A;   EXPLORATORY LAPAROTOMY  2009   GASTRIC BYPASS  2003   [facility]   HEEL SPUR EXCISION Bilateral    KNEE ARTHROSCOPY WITH SUBCHONDROPLASTY Left 09/27/2021   Procedure: Left knee subchondroplasty medial tibial plateau;  Surgeon: Kennedy Bucker, MD;  Location: [facility] ORS;  Service: Orthopedics;  Laterality: Left;   LAPAROTOMY N/A 03/01/2018   Procedure: EXPLORATORY LAPAROTOMY/ UMBILECTOMY;  Surgeon: Earline Mayotte, MD;  Location: [facility] ORS;  Service: General;  Laterality: N/A;   TONSILLECTOMY     VENTRAL HERNIA REPAIR N/A 03/07/2018   Procedure: HERNIA REPAIR VENTRAL ADULT;  Surgeon: Earline Mayotte, MD;  Location: [facility] ORS;  Service: General;  Laterality: N/A;    Medical  History: Past Medical History:  Diagnosis Date   Anemia    Anxiety    Bipolar 1 disorder (HCC)    Collagen vascular disease (HCC)    rhematoid arthritis   Constipation    Dyspnea    Fibromyalgia    GERD (gastroesophageal reflux disease)    Heart murmur    mild-asymptomatic   Hepatic steatosis    History of kidney stones    History of Roux-en-Y gastric bypass    Hypotension    Hypothyroidism    Morbid obesity with BMI of 50.0-59.9, adult (HCC)    Opioid abuse (HCC)    Osteoarthritis    Osteoporosis    PONV (postoperative nausea and vomiting)    nausea only   Rheumatic fever    Rheumatoid arthritis (HCC)    Small bowel obstruction (HCC)    Thyroid disease     Family History: Family History  Problem Relation Age of Onset   Depression Mother    Dementia Mother  Heart disease Mother    Osteoporosis Mother    Parkinson's disease Father    Liver disease Brother    Colon cancer Neg Hx     Social History: Social History   Socioeconomic History   Marital status: Married    Spouse name: Not on file   Number of children: Not on file   Years of education: Not on file   Highest education level: Not on file  Occupational History   Not on file  Tobacco Use   Smoking status: Never    Passive exposure: Past   Smokeless tobacco: Never  Vaping Use   Vaping status: Never Used  Substance and Sexual Activity   Alcohol use: Not Currently   Drug use: No   Sexual activity: Yes  Other Topics Concern   Not on file  Social History Narrative   Not on file   Social Determinants of Health   Financial Resource Strain: Low Risk  (12/13/2022)   Received from [facility], [facility]   Overall Financial Resource Strain (CARDIA)    Difficulty of Paying Living Expenses: Not hard at all  Food Insecurity: No Food Insecurity (12/13/2022)   Received from [facility], [facility]   Hunger Vital Sign    Worried  About Running Out of Food in the Last Year: Never true    Ran Out of Food in the Last Year: Never true  Transportation Needs: No Transportation Needs (12/13/2022)   Received from [facility], [facility]   [facility] - Transportation    In the past 12 months, has lack of transportation kept you from medical appointments or from getting medications?: No    Lack of Transportation (Non-Medical): No  Physical Activity: Insufficiently Active (12/13/2022)   Received from [facility], [facility]   Exercise Vital Sign    Days of Exercise per Week: 1 day    Minutes of Exercise per Session: 10 min  Stress: Stress Concern Present (12/13/2022)   Received from [place] [facility], [facility]   [employer] of Occupational Health - Occupational Stress Questionnaire    Feeling of Stress : To some extent  Social Connections: Socially Isolated (12/13/2022)   Received from [facility], [facility]   Social Connection and Isolation Panel [NHANES]    Frequency of Communication with Friends and Family: Never    Frequency of Social Gatherings with Friends and Family: Never    Attends Religious Services: Never    Database administrator or Organizations: No    Attends Banker Meetings: Never    Marital Status: Married  Catering manager Violence: Unknown (05/11/2022)   Received from [employer]   Abuse Screen    Unsafe at Home or Work/School: Not on file    Feels Threatened by Someone?: Not on file    Does Anyone Keep You from Contacting Others or Doint Things Outside the Home?: Not on file    Physical Sign of Abuse Present: Not on file    Vital Signs: Blood pressure (!) 145/90, pulse 75, temperature 97.6 F (36.4 C), resp. rate 16, height 5' 4.5" (1.638 m), weight (!) 343 lb 12.8 oz (155.9 kg), SpO2  98%.  Examination: General Appearance: The patient is well-developed, well-nourished, and in no distress. Skin: Gross inspection of skin unremarkable. Head: normocephalic, no gross deformities. Eyes: no gross  deformities noted. ENT: ears appear grossly normal no exudates. Neck: Supple. No thyromegaly. No LAD. Respiratory: no rhonchi noted. Cardiovascular: Normal S1 and S2 without murmur or rub. Extremities: No cyanosis. pulses are equal. Neurologic: Alert and oriented. No involuntary movements.  LABS: Recent Results (from the past 2160 hour(s))  POCT urinalysis dipstick     Status: Abnormal   Collection Time: 03/13/23  9:41 AM  Result Value Ref Range   Color, UA     Clarity, UA     Glucose, UA Negative Negative   Bilirubin, UA Negative    Ketones, UA trace    Spec Grav, UA 1.015 1.010 - 1.025   Blood, UA moderate    pH, UA 5.0 5.0 - 8.0   Protein, UA Negative Negative   Urobilinogen, UA 0.2 0.2 or 1.0 E.U./dL   Nitrite, UA negative    Leukocytes, UA Moderate (2+) (A) Negative   Appearance     Odor    CULTURE, URINE COMPREHENSIVE     Status: Abnormal   Collection Time: 03/13/23  3:52 PM   Specimen: Urine   Urine  Result Value Ref Range   Urine Culture, Comprehensive Final report (A)    Organism ID, Bacteria Enterococcus faecalis (A)     Comment: For Enterococcus species, aminoglycosides (except for high-level resistance screening), cephalosporins, clindamycin, and trimethoprim-sulfamethoxazole are not effective clinically. (CLSI, M100-S26, 2016) Greater than 100,000 colony forming units per mL    ANTIMICROBIAL SUSCEPTIBILITY Comment     Comment:       ** S = Susceptible; I = Intermediate; R = Resistant **                    P = Positive; N = Negative             MICS are expressed in micrograms per mL    Antibiotic                 RSLT#1    RSLT#2    RSLT#3    RSLT#4 Ciprofloxacin                  S Levofloxacin                   S Nitrofurantoin                  S Penicillin                     S Tetracycline                   R Vancomycin                     S     Radiology: CT Soft Tissue Neck W Contrast  Result Date: 08/03/2022 CLINICAL DATA:  Palate weakness, stridor versus wheezing for 1 month, question upper airway etiology EXAM: CT NECK WITH CONTRAST TECHNIQUE: Multidetector CT imaging of the neck was performed using the standard protocol following the bolus administration of intravenous contrast. RADIATION DOSE REDUCTION: This exam was performed according to the departmental dose-optimization program which includes automated exposure control, adjustment of the mA and/or kV according to patient size and/or use of iterative reconstruction technique. CONTRAST:  80mL OMNIPAQUE IOHEXOL 350 MG/ML SOLN COMPARISON:  02/28/2016 CTA neck FINDINGS: Pharynx and larynx: Normal. No mass or swelling. Salivary glands: No inflammation, mass, or stone. Thyroid: Normal. Lymph nodes: None enlarged or abnormal density. Vascular:  Patent. Limited intracranial: Negative. Visualized orbits: Negative. Mastoids and visualized paranasal sinuses: Clear. Skeleton: No acute osseous abnormality. Upper chest: No focal pulmonary opacity or pleural effusion. Other: None. IMPRESSION: No acute abnormality in the neck. No etiology is seen for the patient's reported palate weakness or wheezing. Electronically Signed   By: Wiliam Ke M.D.   On: 08/03/2022 19:49   CT Angio Chest PE W and/or Wo Contrast  Result Date: 08/03/2022 CLINICAL DATA:  History of prior COVID infection with persistent cough and wheezing, initial encounter EXAM: CT ANGIOGRAPHY CHEST WITH CONTRAST TECHNIQUE: Multidetector CT imaging of the chest was performed using the standard protocol during bolus administration of intravenous contrast. Multiplanar CT image reconstructions and MIPs were obtained to evaluate the vascular anatomy. RADIATION DOSE REDUCTION: This exam was performed according to the departmental  dose-optimization program which includes automated exposure control, adjustment of the mA and/or kV according to patient size and/or use of iterative reconstruction technique. CONTRAST:  80mL OMNIPAQUE IOHEXOL 350 MG/ML SOLN COMPARISON:  Chest x-ray from earlier in the same day. FINDINGS: Cardiovascular: Ascending aorta is mildly dilated 4.3 cm. No evidence of dissection is seen. No cardiac enlargement is noted. Mitral annular calcifications are seen. The pulmonary artery shows a normal branching pattern bilaterally. No filling defect to suggest pulmonary embolism is seen. Mediastinum/Nodes: Thoracic inlet is within normal limits. No hilar or mediastinal adenopathy is noted. The esophagus as visualized is within normal limits. Lungs/Pleura: Lungs are well aerated bilaterally. No focal infiltrate or effusion is seen. No parenchymal nodules are seen. Upper Abdomen: Postsurgical changes about the stomach are noted. The remainder of the upper abdomen is within normal limits. Musculoskeletal: Degenerative changes of the thoracic spine are noted. No acute rib abnormality is seen. Review of the MIP images confirms the above findings. IMPRESSION: No evidence of pulmonary emboli. Dilatation of the ascending aorta to 4.3 cm. Recommend annual imaging followup by CTA or MRA. This recommendation follows 2010 ACCF/AHA/AATS/ACR/ASA/SCA/SCAI/SIR/STS/SVM Guidelines for the Diagnosis and Management of Patients with Thoracic Aortic Disease. Circulation. 2010; 121: B147-W295. Aortic aneurysm NOS (ICD10-I71.9) Electronically Signed   By: Alcide Clever M.D.   On: 08/03/2022 19:32   DG Chest 2 View  Result Date: 08/03/2022 CLINICAL DATA:  Cough and wheezing EXAM: CHEST - 2 VIEW COMPARISON:  Chest 07/07/2022 FINDINGS: Heart size upper normal. Vascularity normal. Negative for heart failure. Lungs clear without infiltrate effusion Chronic compression fracture lower thoracic spine unchanged. IMPRESSION: No active cardiopulmonary disease.  Electronically Signed   By: Marlan Palau M.D.   On: 08/03/2022 15:09    No results found.  No results found.  Assessment and Plan: Patient Active Problem List   Diagnosis Date Noted   Moderate persistent asthma with acute exacerbation 08/03/2022   Chronic knee pain (1ry area of Pain) (Left) 04/19/2022   Tricompartment osteoarthritis of knee (Left) 04/19/2022   Primary osteoarthritis of knee (Left) 04/19/2022   Chronic anticoagulation (Plaquenil) 04/19/2022   History of attempted suicide (07/10/2016) 04/18/2022   History of drug overdose (11/08/2015) 04/18/2022   Chronic pain syndrome 04/17/2022   Pharmacologic therapy 04/17/2022   Disorder of skeletal system 04/17/2022   Problems influencing health status 04/17/2022   Neck pain, musculoskeletal 03/24/2020   Bipolar affective disorder, current episode mixed (HCC) 11/09/2019   Encounter for general adult medical examination with abnormal findings 06/15/2019   Closed fracture of elbow (Left) 06/15/2019   Urinary tract infection without hematuria 06/15/2019   Encounter for long-term (current) use of medications 06/15/2019   Acute  upper respiratory infection 06/06/2019   Exposure to COVID-19 virus 06/06/2019   BMI 50.0-59.9, adult (HCC) 06/03/2019   Exercise-induced asthma 12/15/2018   Screening for breast cancer 05/29/2018   Duodenal ulcer disease 05/29/2018   Screening for malignant neoplasm of cervix 05/29/2018   Dysuria 05/29/2018   Surgical wound dehiscence, initial encounter 04/14/2018   Postoperative abdominal hernia with obstruction    Incarcerated ventral hernia 03/22/2018   SBO (small bowel obstruction) (HCC) 03/07/2018   Persistent umbilical sinus 02/22/2018   Atopic dermatitis 12/05/2017   Adjustment disorder with mixed anxiety and depressed mood 03/10/2017   Major depressive disorder 03/10/2017   Primary osteoarthritis of knees (Bilateral) 01/19/2017   Suicide attempt (HCC) 07/10/2016   Fibromyalgia 07/10/2016    High risk medication use 07/10/2016   Hypotension 11/09/2015   Respiratory failure (HCC)    Drug overdose (11/08/2015) (undetermined intent) 11/08/2015   Fatty infiltration of liver 02/16/2015   Hepatic fibrosis 02/16/2015   Iron deficiency anemia 02/05/2015   Vitamin B 12 deficiency 02/05/2015   OP (osteoporosis) 06/16/2014   Rheumatoid arteritis (HCC) 03/02/2014   Hypothyroidism 03/02/2014   Rheumatic fever without heart involvement 03/02/2014   Adult hypothyroidism 03/02/2014   Arthritis of pelvic region, degenerative 03/02/2014   History of bariatric surgery 11/24/2013    Class: History of   Bipolar affective disorder (HCC) 11/24/2013   Rheumatoid arthritis (HCC) 11/24/2013   Polysubstance (excluding opioids) dependence (HCC) 09/11/2013   Polysubstance dependence (HCC) 09/11/2013   Combined drug dependence excluding opioids (HCC) 09/11/2013   Arthritis or polyarthritis, rheumatoid (HCC) 09/05/2013   Bipolar 1 disorder, depressed (HCC) 09/05/2013    1. GERD without esophagitis She is on OTC PPI suggesetd upping the dose. I did discuss possible side effects including osteoporosis and also dementia and development - omeprazole (PRILOSEC) 40 MG capsule; Take 1 capsule (40 mg total) by mouth daily.  Dispense: 30 capsule; Refill: 3  2. Chronic cough Multiple factors here obesity obviously being 1 otherwise GERD is probably contributing to her cough.  She unfortunately is significantly overweight which also is contributing to her worsening of GERD.  3. Obesity, morbid (HCC) Obesity Counseling: Had a lengthy discussion regarding patients BMI and weight issues. Patient was instructed on portion control as well as increased activity. Also discussed caloric restrictions with trying to maintain intake less than 2000 Kcal. Discussions were made in accordance with the 5As of weight management. Simple actions such as not eating late and if able to, taking a walk is suggested.     General  Counseling: I have discussed the findings of the evaluation and examination with Rosey Bath.  I have also discussed any further diagnostic evaluation thatmay be needed or ordered today. Gladiola verbalizes understanding of the findings of todays visit. We also reviewed her medications today and discussed drug interactions and side effects including but not limited excessive drowsiness and altered mental states. We also discussed that there is always a risk not just to her but also people around her. she has been encouraged to call the office with any questions or concerns that should arise related to todays visit.  No orders of the defined types were placed in this encounter.    Time spent: 71  I have personally obtained a history, examined the patient, evaluated laboratory and imaging results, formulated the assessment and plan and placed orders.    Yevonne Pax, MD [facility] Pulmonary and Critical Care Sleep medicine

## 2023-05-16 ENCOUNTER — Inpatient Hospital Stay: Payer: Medicare HMO

## 2023-05-23 ENCOUNTER — Inpatient Hospital Stay: Payer: Medicare HMO | Attending: Oncology

## 2023-05-23 DIAGNOSIS — E538 Deficiency of other specified B group vitamins: Secondary | ICD-10-CM

## 2023-05-23 MED ORDER — CYANOCOBALAMIN 1000 MCG/ML IJ SOLN
1000.0000 ug | Freq: Once | INTRAMUSCULAR | Status: AC
  Administered 2023-05-23: 1000 ug via INTRAMUSCULAR
  Filled 2023-05-23: qty 1

## 2023-05-27 ENCOUNTER — Other Ambulatory Visit: Payer: Self-pay | Admitting: Nurse Practitioner

## 2023-05-27 ENCOUNTER — Other Ambulatory Visit: Payer: Self-pay | Admitting: Rheumatology

## 2023-05-27 DIAGNOSIS — M0579 Rheumatoid arthritis with rheumatoid factor of multiple sites without organ or systems involvement: Secondary | ICD-10-CM

## 2023-05-27 DIAGNOSIS — G8929 Other chronic pain: Secondary | ICD-10-CM

## 2023-05-28 NOTE — Telephone Encounter (Signed)
Last Fill: 02/20/2023  Eye exam: 04/24/2022 WNL   Labs: 01/31/2023 CMP stable, 01/23/2023 CBC WNL  Next Visit: 07/03/2023  Last Visit: 01/31/2023  DX: Rheumatoid arthritis involving multiple sites with positive rheumatoid factor   Current Dose per office note 01/31/2023: Plaquenil 200 mg 1 tablet by mouth twice daily.   Attempted to contact and left message to advise patient she is due to update her PLQ eye exam.   Okay to refill Plaquenil?

## 2023-06-06 DIAGNOSIS — H524 Presbyopia: Secondary | ICD-10-CM | POA: Diagnosis not present

## 2023-06-15 ENCOUNTER — Encounter: Payer: Self-pay | Admitting: Physician Assistant

## 2023-06-15 ENCOUNTER — Inpatient Hospital Stay: Payer: Medicare HMO

## 2023-06-15 ENCOUNTER — Ambulatory Visit (INDEPENDENT_AMBULATORY_CARE_PROVIDER_SITE_OTHER): Payer: Medicare HMO | Admitting: Physician Assistant

## 2023-06-15 VITALS — BP 136/76 | HR 79 | Temp 98.2°F | Resp 16 | Ht 64.5 in | Wt 345.4 lb

## 2023-06-15 DIAGNOSIS — Z0000 Encounter for general adult medical examination without abnormal findings: Secondary | ICD-10-CM

## 2023-06-15 NOTE — Progress Notes (Signed)
[facility] [address] [place], [place] [address]  Internal MEDICINE  Office Visit Note  Patient Name: Jamie Bennett  [id number]  [id number]  Date of Service: 06/25/2023  Chief Complaint  Patient presents with   Medicare Wellness   Gastroesophageal Reflux   Quality Metric Gaps    Flu Vaccine    HPI Zelva presents for an annual well visit and physical exam.  Well-appearing 62 y.o. female Routine CRC screening: Due 2028 Routine mammogram: Done January 10, 2023 Pap smear: Due, previous GYN left and will establish with new GYN in new year Labs: will need lipids checked, wants to hold off for now. Other labs checked by outside providers Other concerns: tdap last year and will send dates. Working with [facility], wegovy not approved. [facility] not in network. Looking at another clinic vs compound. -Does follow with several specialists      06/15/2023   10:07 AM 06/09/2022   10:14 AM 06/02/2021    2:16 PM  MMSE - Mini Mental State Exam  Orientation to time 5 5 5   Orientation to Place 5 5 5   Registration 3 3 3   Attention/ Calculation 5 5 5   Recall 3 3 3   Language- name 2 objects 2 2 2   Language- repeat 1 1 1   Language- follow 3 step command 3 3 3   Language- read & follow direction 1 1 1   Write a sentence 1 1 1   Copy design 1 1 1   Total score 30 30 30     Functional Status Survey: Is the patient deaf or have difficulty hearing?: No Does the patient have difficulty seeing, even when wearing glasses/contacts?: No Does the patient have difficulty concentrating, remembering, or making decisions?: No Does the patient have difficulty walking or climbing stairs?: No Does the patient have difficulty dressing or bathing?: No Does the patient have difficulty doing errands alone such as visiting a doctor's office or shopping?: No     07/12/2022    1:20 PM 07/18/2022    2:12 PM 08/03/2022    2:35 PM 12/11/2022    2:47 PM 06/15/2023   10:07 AM  Fall Risk   Falls in the past year?    0 0  (RETIRED) Patient Fall Risk Level High fall risk Low fall risk Low fall risk         06/15/2023   10:07 AM  Depression screen PHQ 2/9  Decreased Interest 0  Down, Depressed, Hopeless 0  PHQ - 2 Score 0       03/08/2017    2:12 PM 02/21/2017   11:09 AM  GAD 7 : Generalized Anxiety Score  Nervous, Anxious, on Edge    Control/stop worrying    Worry too much - different things    Trouble relaxing    Restless    Easily annoyed or irritable    Afraid - awful might happen    Total GAD 7 Score    Anxiety Difficulty       Information is confidential and restricted. Go to Review Flowsheets to unlock data.      Current Medication: Outpatient Encounter Medications as of 06/15/2023  Medication Sig   acetaminophen (TYLENOL) 500 MG tablet Take 500-1,000 mg by mouth every 6 (six) hours as needed (pain.).   albuterol (VENTOLIN HFA) 108 (90 Base) MCG/ACT inhaler Inhale 2 puffs into the lungs every 6 (six) hours as needed for wheezing or shortness of breath.   benzonatate (TESSALON) 100 MG capsule Take 1-2 tablets  3 times a day as needed for cough   Budeson-Glycopyrrol-Formoterol (BREZTRI AEROSPHERE) 160-9-4.8 MCG/ACT AERO Inhale 2 puffs into the lungs 2 (two) times daily.   busPIRone (BUSPAR) 30 MG tablet 1 bid   Calcium Carbonate (CALCIUM 600 PO) Take 600 mg by mouth in the morning and at bedtime.   chlorpheniramine-HYDROcodone (TUSSIONEX) 10-8 MG/5ML Take 5 mLs by mouth every 12 (twelve) hours as needed for cough.   Cholecalciferol 125 MCG (5000 UT) TABS Take 5,000 Units by mouth in the morning.   clonazePAM (KLONOPIN) 0.5 MG tablet TAKE 1 TABLET BY MOUTH ONCE DAILY AS NEEDED FOR  ACUTE  ANXIETY. Pt may take 1 extra tablet (0.5 mg) PRN as needed for severe anxiety.   Coenzyme Q10 300 MG CAPS Take 300 mg by mouth in the morning.   cyanocobalamin (,VITAMIN B-12,) 1000 MCG/ML injection Inject 1,000 mcg into the muscle every 30 (thirty) days.    denosumab  (PROLIA) 60 MG/ML SOSY injection Inject 60 mg into the skin every 6 (six) months.   desipramine (NORPRAMIN) 25 MG tablet Take 1 tablet (25 mg total) by mouth daily.   hydroxychloroquine (PLAQUENIL) 200 MG tablet TAKE 1 TABLET TWICE A DAY   hydrOXYzine (ATARAX) 50 MG tablet Take 1 tablet (50 mg total) by mouth 3 (three) times daily as needed for anxiety.   ipratropium-albuterol (DUONEB) 0.5-2.5 (3) MG/3ML SOLN Take 3 mLs by nebulization every 4 (four) hours as needed.   Krill Oil 500 MG CAPS Take 500 mg by mouth daily at 12 noon.   lamoTRIgine (LAMICTAL) 150 MG tablet Take 1 tablet (150 mg total) by mouth 2 (two) times daily.   levothyroxine (SYNTHROID) 112 MCG tablet TAKE 1 TABLET BY MOUTH ONCE DAILY IN THE MORNING BEFORE BREAKFAST   loratadine (CLARITIN) 10 MG tablet Take 10 mg by mouth daily as needed for allergies.   magnesium oxide (MAG-OX) 400 MG tablet Take 400 mg by mouth every evening.   metoCLOPramide (REGLAN) 5 MG tablet Take 1 tablet (5 mg total) by mouth at bedtime.   Multiple Vitamin (MULTIVITAMIN) tablet Take 1 tablet by mouth every evening.   mupirocin ointment (BACTROBAN) 2 % Apply 1 Application topically 2 (two) times daily.   Nebulizers (COMPRESSOR/NEBULIZER) MISC Dispense 1 machine with associated mask and tubing   nitrofurantoin, macrocrystal-monohydrate, (MACROBID) 100 MG capsule Take 1 capsule (100 mg total) by mouth 2 (two) times daily.   nystatin (MYCOSTATIN/NYSTOP) powder Apply 1 Application topically 2 (two) times daily.   omeprazole (PRILOSEC) 40 MG capsule Take 1 capsule (40 mg total) by mouth daily.   polyethylene glycol (MIRALAX / GLYCOLAX) 17 g packet Take 17 g by mouth in the morning and at bedtime.   psyllium (METAMUCIL) 58.6 % packet Take 1 packet by mouth in the morning.   sucralfate (CARAFATE) 1 g tablet Take 1 tablet (1 g total) by mouth 2 (two) times daily.   tiZANidine (ZANAFLEX) 4 MG tablet TAKE 1 TABLET 3 TIMES A DAY   venlafaxine (EFFEXOR) 100 MG tablet  Take 1 tablet (100 mg total) by mouth 3 (three) times daily with meals.   vitamin C (ASCORBIC ACID) 500 MG tablet Take 500 mg by mouth in the morning.   zinc gluconate 50 MG tablet Take 50 mg by mouth in the morning.   No facility-administered encounter medications on file as of 06/15/2023.    Surgical History: Past Surgical History:  Procedure Laterality Date   CHOLECYSTECTOMY  2004   COLONOSCOPY N/A 07/16/2017   Procedure: COLONOSCOPY;  Surgeon: Scot Jun, MD;  Location: [facility] ENDOSCOPY;  Service: Endoscopy;  Laterality: N/A;   EXPLORATORY LAPAROTOMY  2009   GASTRIC BYPASS  2003   [facility]   HEEL SPUR EXCISION Bilateral    KNEE ARTHROSCOPY WITH SUBCHONDROPLASTY Left 09/27/2021   Procedure: Left knee subchondroplasty medial tibial plateau;  Surgeon: Kennedy Bucker, MD;  Location: [facility] ORS;  Service: Orthopedics;  Laterality: Left;   LAPAROTOMY N/A 03/01/2018   Procedure: EXPLORATORY LAPAROTOMY/ UMBILECTOMY;  Surgeon: Earline Mayotte, MD;  Location: [facility] ORS;  Service: General;  Laterality: N/A;   TONSILLECTOMY     VENTRAL HERNIA REPAIR N/A 03/07/2018   Procedure: HERNIA REPAIR VENTRAL ADULT;  Surgeon: Earline Mayotte, MD;  Location: [facility] ORS;  Service: General;  Laterality: N/A;    Medical History: Past Medical History:  Diagnosis Date   Anemia    Anxiety    Bipolar 1 disorder (HCC)    Collagen vascular disease (HCC)    rhematoid arthritis   Constipation    Dyspnea    Fibromyalgia    GERD (gastroesophageal reflux disease)    Heart murmur    mild-asymptomatic   Hepatic steatosis    History of kidney stones    History of Roux-en-Y gastric bypass    Hypotension    Hypothyroidism    Morbid obesity with BMI of 50.0-59.9, adult (HCC)    Opioid abuse (HCC)    Osteoarthritis    Osteoporosis    PONV (postoperative nausea and vomiting)    nausea only   Rheumatic fever    Rheumatoid arthritis (HCC)    Small bowel obstruction (HCC)    Thyroid disease      Family History: Family History  Problem Relation Age of Onset   Depression Mother    Dementia Mother    Heart disease Mother    Osteoporosis Mother    Parkinson's disease Father    Liver disease Brother    Colon cancer Neg Hx     Social History   Socioeconomic History   Marital status: Married    Spouse name: Not on file   Number of children: Not on file   Years of education: Not on file   Highest education level: Not on file  Occupational History   Not on file  Tobacco Use   Smoking status: Never    Passive exposure: Past   Smokeless tobacco: Never  Vaping Use   Vaping status: Never Used  Substance and Sexual Activity   Alcohol use: Not Currently   Drug use: No   Sexual activity: Yes  Other Topics Concern   Not on file  Social History Narrative   Not on file   Social Determinants of Health   Financial Resource Strain: Low Risk  (12/13/2022)   Received from [facility], [facility]   Overall Financial Resource Strain (CARDIA)    Difficulty of Paying Living Expenses: Not hard at all  Food Insecurity: No Food Insecurity (12/13/2022)   Received from [facility], [facility]   Hunger Vital Sign    Worried About Running Out of Food in the Last Year: Never true    Ran Out of Food in the Last Year: Never true  Transportation Needs: No Transportation Needs (12/13/2022)   Received from [facility], [facility]   [facility] - Transportation    In the past 12 months, has lack of transportation kept you from medical appointments or from  getting medications?: No    Lack of Transportation (Non-Medical): No  Physical Activity: Insufficiently Active (12/13/2022)   Received from [facility], [facility]   Exercise Vital Sign    Days of Exercise per Week: 1 day    Minutes of Exercise per Session: 10 min  Stress: Stress Concern Present  (12/13/2022)   Received from [facility], [facility]   [employer] [facility] - Occupational Stress Questionnaire    Feeling of Stress : To some extent  Social Connections: Socially Isolated (12/13/2022)   Received from [facility], [facility]   Social Connection and Isolation Panel [NHANES]    Frequency of Communication with Friends and Family: Never    Frequency of Social Gatherings with Friends and Family: Never    Attends Religious Services: Never    Database administrator or Organizations: No    Attends Banker Meetings: Never    Marital Status: Married  Catering manager Violence: Unknown (05/11/2022)   Received from [facility]   Abuse Screen    Unsafe at Home or Work/School: Not on file    Feels Threatened by Someone?: Not on file    Does Anyone Keep You from Contacting Others or Doint Things Outside the Home?: Not on file    Physical Sign of Abuse Present: Not on file      Review of Systems  Constitutional:  Negative for chills, fatigue and unexpected weight change.  HENT:  Positive for postnasal drip. Negative for congestion, rhinorrhea, sneezing and sore throat.   Eyes:  Negative for redness.  Respiratory:  Negative for chest tightness and shortness of breath.   Cardiovascular:  Negative for chest pain and palpitations.  Gastrointestinal:  Negative for abdominal pain, constipation, diarrhea, nausea and vomiting.  Genitourinary:  Negative for dysuria and frequency.  Musculoskeletal:  Positive for arthralgias. Negative for back pain, joint swelling and neck pain.  Skin:  Negative for rash.  Neurological: Negative.  Negative for tremors and numbness.  Hematological:  Negative for adenopathy. Does not bruise/bleed easily.  Psychiatric/Behavioral:  Negative for behavioral problems (Depression), sleep disturbance and suicidal ideas.      Vital Signs: BP 136/76 Comment: 130/90  Pulse 79   Temp 98.2 F (36.8 C)   Resp 16   Ht 5' 4.5" (1.638 m)   Wt (!) 345 lb 6.4 oz (156.7 kg)   SpO2 96%   BMI 58.37 kg/m    Physical Exam Vitals and nursing note reviewed.  Constitutional:      Appearance: Normal appearance. She is obese.  HENT:     Head: Normocephalic and atraumatic.  Eyes:     Pupils: Pupils are equal, round, and reactive to light.  Cardiovascular:     Rate and Rhythm: Normal rate and regular rhythm.  Pulmonary:     Effort: Pulmonary effort is normal.     Breath sounds: Normal breath sounds.  Neurological:     Mental Status: She is alert.  Psychiatric:        Behavior: Behavior normal.        Assessment/Plan: 1. Encounter for Medicare annual wellness exam AWV performed, due for pap and will schedule. Wants to hold off on rechecking lipids for now    General Counseling: nolah latshaw understanding of the findings of todays visit and agrees with plan of treatment. I have discussed any further diagnostic evaluation that may be needed  or ordered today. We also reviewed her medications today. she has been encouraged to call the office with any questions or concerns that should arise related to todays visit.    No orders of the defined types were placed in this encounter.   No orders of the defined types were placed in this encounter.   Return in about 6 months (around 12/13/2023) for general follow up.   Total time spent:35 Minutes Time spent includes review of chart, medications, test results, and follow up plan with the patient.   [place] Controlled Substance Database was reviewed by me.  This patient was seen by Lynn Ito, PA-C in collaboration with Dr. Beverely Risen as a part of collaborative care agreement.  Lynn Ito, PA-C Internal medicine

## 2023-06-20 NOTE — Progress Notes (Unsigned)
Office Visit Note  Patient: Jamie Bennett             Date of Birth: Nov 28, 1960           MRN: [id number]             PCP: Carlean Jews, PA-C Referring: Lyndon Code, MD Visit Date: 07/03/2023 Occupation: @GUAROCC @  Subjective:  Pain in both hands   History of Present Illness: Jamie Bennett is a 62 y.o. female with history of seropositive rheumatoid arthritis and osteoarthritis.  Patient remains on Plaquenil 200 mg 1 tablet by mouth twice daily.  She is tolerating Plaquenil without any side effects and has not had any gaps in therapy.  Patient states that she has been experiencing increased discomfort in both elbows on the medial aspect as well as increased pain and swelling in both hands.  Patient states that the swelling and stiffness in her hands is worse first thing in the mornings.  She has also been having some increased discomfort and crepitus in bilateral TMJ.  She continues to have chronic pain involving both knee joints but has been unable to schedule knee replacement due to her BMI.  She has been working on weight loss.  She is using a cane to assist with ambulation.   Activities of Daily Living:  Patient reports morning stiffness for 1 hour.   Patient Denies nocturnal pain.  Difficulty dressing/grooming: Denies Difficulty climbing stairs: Reports Difficulty getting out of chair: Reports Difficulty using hands for taps, buttons, cutlery, and/or writing: Denies  Review of Systems  Constitutional:  Positive for fatigue.  HENT:  Positive for mouth dryness. Negative for mouth sores.   Eyes:  Negative for dryness.  Respiratory:  Positive for shortness of breath.   Cardiovascular:  Negative for chest pain and palpitations.  Gastrointestinal:  Positive for constipation. Negative for blood in stool and diarrhea.  Endocrine: Negative for increased urination.  Genitourinary:  Negative for involuntary urination.  Musculoskeletal:  Positive for joint pain, gait  problem, joint pain, joint swelling, myalgias, morning stiffness, muscle tenderness and myalgias. Negative for muscle weakness.  Skin:  Positive for hair loss. Negative for color change, rash and sensitivity to sunlight.  Allergic/Immunologic: Positive for susceptible to infections.  Neurological:  Positive for headaches. Negative for dizziness.  Hematological:  Negative for swollen glands.  Psychiatric/Behavioral:  Positive for depressed mood. Negative for sleep disturbance. The patient is not nervous/anxious.     PMFS History:  Patient Active Problem List   Diagnosis Date Noted   Moderate persistent asthma with acute exacerbation 08/03/2022   Chronic knee pain (1ry area of Pain) (Left) 04/19/2022   Tricompartment osteoarthritis of knee (Left) 04/19/2022   Primary osteoarthritis of knee (Left) 04/19/2022   Chronic anticoagulation (Plaquenil) 04/19/2022   History of attempted suicide (07/10/2016) 04/18/2022   History of drug overdose (11/08/2015) 04/18/2022   Chronic pain syndrome 04/17/2022   Pharmacologic therapy 04/17/2022   Disorder of skeletal system 04/17/2022   Problems influencing health status 04/17/2022   Neck pain, musculoskeletal 03/24/2020   Bipolar affective disorder, current episode mixed (HCC) 11/09/2019   Encounter for general adult medical examination with abnormal findings 06/15/2019   Closed fracture of elbow (Left) 06/15/2019   Urinary tract infection without hematuria 06/15/2019   Encounter for long-term (current) use of medications 06/15/2019   Acute upper respiratory infection 06/06/2019   Exposure to COVID-19 virus 06/06/2019   BMI 50.0-59.9, adult (HCC) 06/03/2019   Exercise-induced asthma 12/15/2018  Screening for breast cancer 05/29/2018   Duodenal ulcer disease 05/29/2018   Screening for malignant neoplasm of cervix 05/29/2018   Dysuria 05/29/2018   Surgical wound dehiscence, initial encounter 04/14/2018   Postoperative abdominal hernia with  obstruction    Incarcerated ventral hernia 03/22/2018   SBO (small bowel obstruction) (HCC) 03/07/2018   Persistent umbilical sinus 02/22/2018   Atopic dermatitis 12/05/2017   Adjustment disorder with mixed anxiety and depressed mood 03/10/2017   Major depressive disorder 03/10/2017   Primary osteoarthritis of knees (Bilateral) 01/19/2017   Suicide attempt (HCC) 07/10/2016   Fibromyalgia 07/10/2016   High risk medication use 07/10/2016   Hypotension 11/09/2015   Respiratory failure (HCC)    Drug overdose (11/08/2015) (undetermined intent) 11/08/2015   Fatty infiltration of liver 02/16/2015   Hepatic fibrosis 02/16/2015   Iron deficiency anemia 02/05/2015   Vitamin B 12 deficiency 02/05/2015   OP (osteoporosis) 06/16/2014   Rheumatoid arteritis (HCC) 03/02/2014   Hypothyroidism 03/02/2014   Rheumatic fever without heart involvement 03/02/2014   Adult hypothyroidism 03/02/2014   Arthritis of pelvic region, degenerative 03/02/2014   History of bariatric surgery 11/24/2013    Class: History of   Bipolar affective disorder (HCC) 11/24/2013   Rheumatoid arthritis (HCC) 11/24/2013   Polysubstance (excluding opioids) dependence (HCC) 09/11/2013   Polysubstance dependence (HCC) 09/11/2013   Combined drug dependence excluding opioids (HCC) 09/11/2013   Arthritis or polyarthritis, rheumatoid (HCC) 09/05/2013   Bipolar 1 disorder, depressed (HCC) 09/05/2013    Past Medical History:  Diagnosis Date   Anemia    Anxiety    Bipolar 1 disorder (HCC)    Collagen vascular disease (HCC)    rhematoid arthritis   Constipation    Dyspnea    Fibromyalgia    GERD (gastroesophageal reflux disease)    Heart murmur    mild-asymptomatic   Hepatic steatosis    History of kidney stones    History of Roux-en-Y gastric bypass    Hypotension    Hypothyroidism    Morbid obesity with BMI of 50.0-59.9, adult (HCC)    Opioid abuse (HCC)    Osteoarthritis    Osteoporosis    PONV (postoperative nausea  and vomiting)    nausea only   Rheumatic fever    Rheumatoid arthritis (HCC)    Small bowel obstruction (HCC)    Thyroid disease     Family History  Problem Relation Age of Onset   Depression Mother    Dementia Mother    Heart disease Mother    Osteoporosis Mother    Parkinson's disease Father    Liver disease Brother    Colon cancer Neg Hx    Past Surgical History:  Procedure Laterality Date   CHOLECYSTECTOMY  2004   COLONOSCOPY N/A 07/16/2017   Procedure: COLONOSCOPY;  Surgeon: Scot Jun, MD;  Location: [facility];  Service: Endoscopy;  Laterality: N/A;   EXPLORATORY LAPAROTOMY  2009   GASTRIC BYPASS  2003   [facility]   HEEL SPUR EXCISION Bilateral    KNEE ARTHROSCOPY WITH SUBCHONDROPLASTY Left 09/27/2021   Procedure: Left knee subchondroplasty medial tibial plateau;  Surgeon: Kennedy Bucker, MD;  Location: [facility] ORS;  Service: Orthopedics;  Laterality: Left;   LAPAROTOMY N/A 03/01/2018   Procedure: EXPLORATORY LAPAROTOMY/ UMBILECTOMY;  Surgeon: Earline Mayotte, MD;  Location: [facility] ORS;  Service: General;  Laterality: N/A;   TONSILLECTOMY     VENTRAL HERNIA REPAIR N/A 03/07/2018   Procedure: HERNIA REPAIR VENTRAL ADULT;  Surgeon: Earline Mayotte, MD;  Location: [facility] ORS;  Service: General;  Laterality: N/A;   Social History   Social History Narrative   Not on file   Immunization History  Administered Date(s) Administered   Influenza Inj Mdck Quad Pf 06/09/2022   Influenza, Quadrivalent, Recombinant, Inj, Pf 04/26/2019   Influenza,inj,Quad PF,6+ Mos 04/24/2016   Influenza-Unspecified 04/19/2021   Moderna SARS-COV2 Booster Vaccination 01/30/2021, 05/13/2021   Moderna Sars-Covid-2 Vaccination 10/25/2019, 11/22/2019, 06/02/2020   Pneumococcal Polysaccharide-23 03/02/2018     Objective: Vital Signs: BP 134/84 (BP Location: Right Arm, Patient Position: Sitting, Cuff Size: Normal)   Pulse 67   Resp 18   Ht 5\' 5"  (1.651 m)   Wt (!) 344 lb (156 kg)    BMI 57.24 kg/m    Physical Exam Vitals and nursing note reviewed.  Constitutional:      Appearance: She is well-developed.  HENT:     Head: Normocephalic and atraumatic.  Eyes:     Conjunctiva/sclera: Conjunctivae normal.  Cardiovascular:     Rate and Rhythm: Normal rate and regular rhythm.     Heart sounds: Normal heart sounds.  Pulmonary:     Effort: Pulmonary effort is normal.     Breath sounds: Normal breath sounds.  Abdominal:     General: Bowel sounds are normal.     Palpations: Abdomen is soft.  Musculoskeletal:     Cervical back: Normal range of motion.  Lymphadenopathy:     Cervical: No cervical adenopathy.  Skin:    General: Skin is warm and dry.     Capillary Refill: Capillary refill takes less than 2 seconds.  Neurological:     Mental Status: She is alert and oriented to person, place, and time.  Psychiatric:        Behavior: Behavior normal.      Musculoskeletal Exam: Patent remained seated during the examination today.  C-spine has slightly limited ROM with lateral rotation.  Trapezius muscle tension and tenderness bilaterally.  Shoulder joints have good ROM.  Tenderness over the medial epicondyle of both elbows. Wrist joints, MCPs, PIPs, and DIPs good ROM with no synovitis.  Complete fist formation bilaterally.  PIP and DIP thickening.  Painful ROM of both knees.  No tenderness or synovitis of ankle joints.   CDAI Exam: CDAI Score: -- Patient Global: --; Provider Global: -- Swollen: --; Tender: -- Joint Exam 07/03/2023   No joint exam has been documented for this visit   There is currently no information documented on the homunculus. Go to the Rheumatology activity and complete the homunculus joint exam.  Investigation: No additional findings.  Imaging: No results found.  Recent Labs: Lab Results  Component Value Date   WBC 5.3 01/23/2023   HGB 12.5 01/23/2023   PLT 213 01/23/2023   NA 141 01/31/2023   K 4.0 01/31/2023   CL 108 01/31/2023    CO2 22 01/31/2023   GLUCOSE 94 01/31/2023   BUN 29 (H) 01/31/2023   CREATININE 0.90 01/31/2023   BILITOT 0.4 01/31/2023   ALKPHOS 105 01/10/2022   AST 18 01/31/2023   ALT 13 01/31/2023   PROT 6.9 01/31/2023   ALBUMIN 3.9 01/10/2022   CALCIUM 9.7 01/31/2023   GFRAA 99 05/26/2020    Speciality Comments: PLQ Eye Exam: 06/06/2023 WNL @ [facility] Follow up 1 year   Procedures:  No procedures performed Allergies: Lactose intolerance (gi) and Sulfa antibiotics   Assessment / Plan:     Visit Diagnoses: Rheumatoid arthritis involving multiple sites with positive rheumatoid factor (  [facility]): Patient presents today experiencing increased pain, stiffness, and intermittent swelling in both hands.  Her symptoms have been most severe first thing in the mornings and after crafting.  She remains on Plaquenil 200 mg 1 tablet by mouth twice daily.  She is tolerating Plaquenil without any side effects and has not had any gaps in therapy.  No synovitis was noted on examination today.  Plan to schedule an ultrasound to assess for synovitis.  She will remain on Plaquenil as prescribed for now.  High risk medication use - Plaquenil 200 mg 1 tablet by mouth twice daily. CMP updated on 01/31/23. Orders for CBC and CMP released today.  PLQ Eye Exam: 06/06/2023 WNL @ [facility] Follow up 1 year   - Plan: CBC with Differential/Platelet, COMPLETE METABOLIC PANEL WITH GFR  Primary osteoarthritis of both hands: Patient presents today with increased pain and stiffness involving both hands.  She has noticed increase stiffness as well as swelling in both hands intermittently especially first thing in the mornings.  She continues to use her hands to craft and does not have discomfort while crafting but has had increased discomfort afterwards.  She has no synovitis on examination today.  Plan to schedule ultrasound to assess for synovitis.  Trochanteric bursitis of left hip: Chronic pain.  Primary  osteoarthritis of both knees - S/p orthovisc bilateral knees 08/2020-09/2020. She underwent a left knee subchondroplasty of the medial tibial plateau on 09/27/21 performed by Dr. Rosita Kea.  Chronic pain.  Using a cane to assist with ambulation.  Patient has been unable to schedule a knee replacement due to her BMI.  She has been working on weight loss.  DDD (degenerative disc disease), cervical: C-spine has good ROM. Trapezius muscle tension and tenderness noted bilaterally.  DDD (degenerative disc disease), thoracic: Postural thoracic kyphosis noted. No midline spinal tenderness.    Degeneration of intervertebral disc of lumbar region without discogenic back pain or lower extremity pain: No symptoms of radiculopathy.   Other osteoporosis without current pathological fracture: She remains on Prolia 60 mg sq injections every 6 months prescribed by her endocrinologist.  She is also taking calcium and vitamin D supplement daily.   Fibromyalgia: Patient continues to experience interval myalgias and muscle tenderness due to fibromyalgia.  Her symptoms are often exacerbated by weather changes.  Patient presents today with tenderness along the medial epicondyle of both elbows and has ongoing trapezius muscle tension and tenderness bilaterally.  Patient was given a handout of exercises to perform. Discussed the importance of regular exercise and good sleep hygiene.   Medial epicondylitis of left elbow: She has tenderness palpation over the medial epicondyle of both elbows.  Different treatment options were discussed.  She was given a handout of exercises to perform.  Discussed the option of physical therapy.  She will notify us if her symptoms persist or worsen.  Medial epicondylitis of right elbow: She was given a handout of exercises to perform.  She will notify us if her symptoms persist or worsen.  Discussed the option of physical therapy in the future if needed.  Trapezius muscle spasm: She has ongoing  trapezius muscle tension and tenderness bilaterally.    Other medical conditions are listed as follows:  History of gastroesophageal reflux (GERD)  History of bipolar disorder  History of anemia  History of hypothyroidism  Orders: Orders Placed This Encounter  Procedures   CBC with Differential/Platelet   COMPLETE METABOLIC PANEL WITH GFR   No orders of the defined types  were placed in this encounter.     Follow-Up Instructions: Return in about 5 months (around 12/01/2023) for Rheumatoid arthritis, Osteoarthritis.   Gearldine Bienenstock, PA-C  Note - This record has been created using Dragon software.  Chart creation errors have been sought, but may not always  have been located. Such creation errors do not reflect on  the standard of medical care.

## 2023-06-25 ENCOUNTER — Inpatient Hospital Stay: Payer: Medicare HMO | Attending: Oncology

## 2023-06-25 DIAGNOSIS — E538 Deficiency of other specified B group vitamins: Secondary | ICD-10-CM | POA: Diagnosis not present

## 2023-06-25 MED ORDER — CYANOCOBALAMIN 1000 MCG/ML IJ SOLN
1000.0000 ug | Freq: Once | INTRAMUSCULAR | Status: AC
  Administered 2023-06-25: 1000 ug via INTRAMUSCULAR
  Filled 2023-06-25: qty 1

## 2023-07-03 ENCOUNTER — Ambulatory Visit: Payer: Medicare HMO | Attending: Physician Assistant | Admitting: Physician Assistant

## 2023-07-03 ENCOUNTER — Encounter: Payer: Self-pay | Admitting: Physician Assistant

## 2023-07-03 VITALS — BP 134/84 | HR 67 | Resp 18 | Ht 65.0 in | Wt 344.0 lb

## 2023-07-03 DIAGNOSIS — M51369 Other intervertebral disc degeneration, lumbar region without mention of lumbar back pain or lower extremity pain: Secondary | ICD-10-CM | POA: Diagnosis not present

## 2023-07-03 DIAGNOSIS — M7062 Trochanteric bursitis, left hip: Secondary | ICD-10-CM

## 2023-07-03 DIAGNOSIS — Z8659 Personal history of other mental and behavioral disorders: Secondary | ICD-10-CM

## 2023-07-03 DIAGNOSIS — Z79899 Other long term (current) drug therapy: Secondary | ICD-10-CM | POA: Diagnosis not present

## 2023-07-03 DIAGNOSIS — M5134 Other intervertebral disc degeneration, thoracic region: Secondary | ICD-10-CM

## 2023-07-03 DIAGNOSIS — M170 Bilateral primary osteoarthritis of knee: Secondary | ICD-10-CM

## 2023-07-03 DIAGNOSIS — M19042 Primary osteoarthritis, left hand: Secondary | ICD-10-CM

## 2023-07-03 DIAGNOSIS — M818 Other osteoporosis without current pathological fracture: Secondary | ICD-10-CM

## 2023-07-03 DIAGNOSIS — M797 Fibromyalgia: Secondary | ICD-10-CM

## 2023-07-03 DIAGNOSIS — M19041 Primary osteoarthritis, right hand: Secondary | ICD-10-CM | POA: Diagnosis not present

## 2023-07-03 DIAGNOSIS — Z862 Personal history of diseases of the blood and blood-forming organs and certain disorders involving the immune mechanism: Secondary | ICD-10-CM

## 2023-07-03 DIAGNOSIS — M62838 Other muscle spasm: Secondary | ICD-10-CM

## 2023-07-03 DIAGNOSIS — M0579 Rheumatoid arthritis with rheumatoid factor of multiple sites without organ or systems involvement: Secondary | ICD-10-CM

## 2023-07-03 DIAGNOSIS — Z8639 Personal history of other endocrine, nutritional and metabolic disease: Secondary | ICD-10-CM

## 2023-07-03 DIAGNOSIS — M7701 Medial epicondylitis, right elbow: Secondary | ICD-10-CM | POA: Diagnosis not present

## 2023-07-03 DIAGNOSIS — M5030 Other cervical disc degeneration, unspecified cervical region: Secondary | ICD-10-CM

## 2023-07-03 DIAGNOSIS — M7702 Medial epicondylitis, left elbow: Secondary | ICD-10-CM

## 2023-07-03 DIAGNOSIS — Z8719 Personal history of other diseases of the digestive system: Secondary | ICD-10-CM

## 2023-07-03 NOTE — Patient Instructions (Signed)
[facility] Ask your health care provider which exercises are safe for you. Do exercises exactly as told by your health care provider and adjust them as directed. It is normal to feel mild stretching, pulling, tightness, or discomfort as you do these exercises. Stop right away if you feel sudden pain or your pain gets worse. Do not begin these exercises until told by your health care provider. Stretching and range-of-motion exercises These exercises warm up your muscles and joints and improve the movement and flexibility of your elbow. Wrist extension, assisted  Straighten your left / right elbow in front of you with your palm facing up toward the ceiling. If told by your health care provider, bend your left / right elbow to a 90-degree angle (right angle) at your side instead of holding it straight. With your other hand, gently pull your left / right hand and fingers toward the floor (extension). Stop when you feel a gentle stretch on the palm side of your forearm. Hold this position for __________ seconds. Repeat __________ times. Complete this exercise __________ times a day. Wrist flexion, assisted  Straighten your left / right elbow in front of you with your palm facing down toward the floor. If told by your health care provider, bend your left / right elbow to a 90-degree angle (right angle) at your side instead of holding it straight. With your other hand, gently push over the back of your left / right hand so your fingers point toward the floor (flexion). Stop when you feel a gentle stretch on the back of your forearm. Hold this position for __________ seconds. Repeat __________ times. Complete this exercise __________ times a day. Assisted forearm rotation, supination Sit or stand with your elbows at your side. Bend your left / right elbow to a 90-degree angle (right angle). Using your uninjured hand, turn your left / right palm up toward the ceiling (supination) until you feel  a gentle stretch along the inside of your forearm. Hold this position for __________ seconds. Repeat __________ times. Complete this exercise __________ times a day. Assisted forearm rotation, pronation Sit or stand with your elbows at your side. Bend your left / right elbow to a 90-degree angle (right angle). Using your uninjured hand, turn your left / right palm down toward the floor (pronation) until you feel a gentle stretch along the top of your forearm. Hold this position for __________ seconds. Repeat __________ times. Complete this exercise __________ times a day. Strengthening exercises These exercises build strength and endurance in your elbow. Endurance is the ability to use your muscles for a long time, even after they get tired. Wrist flexion  Sit with your left / right forearm supported on a table or other surface and your palm turned up toward the ceiling. Let your left / right wrist extend over the edge of the surface. Hold a __________ weight or a piece of rubber exercise band or tubing. If using a rubber exercise band or tubing, hold the other end of the tubing with your other hand. Slowly bend your wrist so your hand moves up toward the ceiling (flexion). Try to only move your wrist and keep the rest of your arm still. Hold this position for __________ seconds. Slowly return to the starting position. Repeat __________ times. Complete this exercise __________ times a day. Wrist flexion, eccentric Sit with your left / right forearm palm-up and supported on a table or other surface. Let your left / right wrist extend over the  edge of the surface. Hold a __________ weight or a piece of rubber exercise band or tubing in your left / right hand. If using a rubber exercise band or tubing, hold the other end of the tubing with your other hand. Use your uninjured hand to move your left / right hand up toward the ceiling. Take your uninjured hand away and slowly return to the  starting position using only your left / right hand (eccentric flexion). Repeat __________ times. Complete this exercise __________ times a day. Forearm rotation, pronation To do this exercise, you will need a lightweight hammer or rubber mallet. Sit with your left / right forearm supported on a table or other surface. Bend your elbow to a 90-degree angle (right angle). Position your forearm so that your palm is facing up toward the ceiling, with your hand resting over the edge of the table. Hold a hammer in your left / right hand. To make this exercise easier, hold the hammer near the head of the hammer. To make this exercise harder, hold the hammer near the end of the handle. Without moving your elbow, slowly turn (rotate) your forearm so your palm faces down toward the floor (pronation). Hold this position for __________ seconds. Slowly return to the starting position. Repeat __________ times. Complete this exercise __________ times a day. Shoulder blade squeeze Sit in a stable chair or stand with good posture. If you are sitting down, do not let your back touch the back of the chair. Your arms should be at your sides with your elbows bent to a 90-degree angle (right angle). Position your forearms so that your thumbs are facing the ceiling (neutral position). Without lifting your shoulders up, squeeze your shoulder blades tightly together. Hold this position for __________ seconds. Slowly release and return to the starting position. Repeat __________ times. Complete this exercise __________ times a day. This information is not intended to replace advice given to you by your health care provider. Make sure you discuss any questions you have with your health care provider. Document Revised: 10/06/2019 Document Reviewed: 10/08/2019 Elsevier Patient Education  2024 [employer].

## 2023-07-04 LAB — CBC WITH DIFFERENTIAL/PLATELET
Absolute Lymphocytes: 1531 {cells}/uL (ref 850–3900)
Absolute Monocytes: 498 {cells}/uL (ref 200–950)
Basophils Absolute: 32 {cells}/uL (ref 0–200)
Basophils Relative: 0.5 %
Eosinophils Absolute: 139 {cells}/uL (ref 15–500)
Eosinophils Relative: 2.2 %
HCT: 42 % (ref 35.0–45.0)
Hemoglobin: 14 g/dL (ref 11.7–15.5)
MCH: 30.1 pg (ref 27.0–33.0)
MCHC: 33.3 g/dL (ref 32.0–36.0)
MCV: 90.3 fL (ref 80.0–100.0)
MPV: 9.5 fL (ref 7.5–12.5)
Monocytes Relative: 7.9 %
Neutro Abs: 4101 {cells}/uL (ref 1500–7800)
Neutrophils Relative %: 65.1 %
Platelets: 248 10*3/uL (ref 140–400)
RBC: 4.65 10*6/uL (ref 3.80–5.10)
RDW: 11.9 % (ref 11.0–15.0)
Total Lymphocyte: 24.3 %
WBC: 6.3 10*3/uL (ref 3.8–10.8)

## 2023-07-04 LAB — COMPLETE METABOLIC PANEL WITH GFR
AG Ratio: 1.7 (calc) (ref 1.0–2.5)
ALT: 17 U/L (ref 6–29)
AST: 18 U/L (ref 10–35)
Albumin: 4.6 g/dL (ref 3.6–5.1)
Alkaline phosphatase (APISO): 111 U/L (ref 37–153)
BUN/Creatinine Ratio: 32 (calc) — ABNORMAL HIGH (ref 6–22)
BUN: 27 mg/dL — ABNORMAL HIGH (ref 7–25)
CO2: 26 mmol/L (ref 20–32)
Calcium: 9.6 mg/dL (ref 8.6–10.4)
Chloride: 105 mmol/L (ref 98–110)
Creat: 0.85 mg/dL (ref 0.50–1.05)
Globulin: 2.7 g/dL (ref 1.9–3.7)
Glucose, Bld: 80 mg/dL (ref 65–99)
Potassium: 4 mmol/L (ref 3.5–5.3)
Sodium: 141 mmol/L (ref 135–146)
Total Bilirubin: 0.4 mg/dL (ref 0.2–1.2)
Total Protein: 7.3 g/dL (ref 6.1–8.1)
eGFR: 77 mL/min/{1.73_m2} (ref >=60)

## 2023-07-04 NOTE — Progress Notes (Signed)
CMP stable. CBC WNL.

## 2023-07-13 ENCOUNTER — Other Ambulatory Visit: Payer: Medicare HMO

## 2023-07-13 ENCOUNTER — Ambulatory Visit: Payer: Medicare HMO | Admitting: Oncology

## 2023-07-13 ENCOUNTER — Other Ambulatory Visit: Payer: Self-pay | Admitting: Physician Assistant

## 2023-07-13 ENCOUNTER — Ambulatory Visit: Payer: Medicare HMO

## 2023-07-13 ENCOUNTER — Other Ambulatory Visit: Payer: Self-pay | Admitting: Nurse Practitioner

## 2023-07-13 DIAGNOSIS — G8929 Other chronic pain: Secondary | ICD-10-CM

## 2023-07-13 DIAGNOSIS — B372 Candidiasis of skin and nail: Secondary | ICD-10-CM

## 2023-07-13 DIAGNOSIS — M0579 Rheumatoid arthritis with rheumatoid factor of multiple sites without organ or systems involvement: Secondary | ICD-10-CM

## 2023-07-16 NOTE — Telephone Encounter (Signed)
Last Fill: 05/28/2023   Eye exam: 06/06/2023   Labs: 07/03/2023  CMP stable. CBC WNL   Next Visit: 08/22/2023  Last Visit: 07/03/2023  [id number] arthritis involving multiple sites with positive rheumatoid factor   Current Dose per office note 07/03/2023: Plaquenil 200 mg 1 tablet by mouth twice daily.   Okay to refill Plaquenil?

## 2023-07-16 NOTE — Telephone Encounter (Signed)
Please review  that its ok  to send

## 2023-07-17 ENCOUNTER — Other Ambulatory Visit: Payer: Self-pay

## 2023-07-17 ENCOUNTER — Ambulatory Visit (HOSPITAL_COMMUNITY): Payer: Medicare HMO | Admitting: Psychiatry

## 2023-07-17 VITALS — BP 152/82 | HR 79 | Ht 65.0 in | Wt 350.0 lb

## 2023-07-17 DIAGNOSIS — F411 Generalized anxiety disorder: Secondary | ICD-10-CM

## 2023-07-17 DIAGNOSIS — F603 Borderline personality disorder: Secondary | ICD-10-CM

## 2023-07-17 DIAGNOSIS — F332 Major depressive disorder, recurrent severe without psychotic features: Secondary | ICD-10-CM | POA: Diagnosis not present

## 2023-07-17 DIAGNOSIS — F324 Major depressive disorder, single episode, in partial remission: Secondary | ICD-10-CM

## 2023-07-17 MED ORDER — VENLAFAXINE HCL 100 MG PO TABS: 100.0000 mg | ORAL_TABLET | Freq: Three times a day (TID) | ORAL | 2 refills | Status: DC

## 2023-07-17 NOTE — Progress Notes (Signed)
`  Psychiatric Initial Adult Assessment   Patient Identification: Jamie Bennett MRN:  [id number] Date of Evaluation:  07/17/2023 Referral Source: grams per previous psychiatrist Chief Complaint:     Today the patient is seen with her husband Lyda Jester.  Patient is doing fairly well.  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  Her brother was the executor but since his death it is left to this patient to handle the house that needs to be sold and other things.  It is complicated.  The patient has a good relationship with her sister-in-law.  They have decided that when the house is sold they also take the money and give it to her brother's kids.  Nonetheless the patient's mood is fairly stable.  She is sleeping and eating well.  Her plans are to eventually to lose weight.  She is obese and to lose weight will then lead to a possible knee replacement.  Her husband is very supportive.  The patient's energy is poor.  She has a problem being motivated.  Her diagnosis in the chart is borderline personality disorder but I think that is inaccurate.  I think more likely she has a mood disorder most likely that of major depression.  At this time she is getting ready to go to [place] to see her sister-in-law and the rest of her family.  The patient's health is reasonably good.  Financially she is very stable.  She likes the home that she lives in and she owns it.  Visit Diagnosis: borderline personality disorder   ICD-10-CM   1. Anxiety state  F41.1 venlafaxine (EFFEXOR) 100 MG tablet    2. Borderline personality disorder (HCC)  F60.3 venlafaxine (EFFEXOR) 100 MG tablet    3. Major depressive disorder, recurrent, severe without psychotic features (HCC)  F33.2 venlafaxine (EFFEXOR) 100 MG tablet       History of Pre Depression Symptoms:  fatigue, (Hypo) Manic Symptoms:   Anxiety Symptoms:   Psychotic Symptoms:   PTSD  Symptoms:   Past Psychiatric History: 10 psychiatric hospitalizations multiple psychotropic medications presently in psychotherapy  Previous Psychotropic Medications: Yes   Substance Abuse History in the last 12 months:  Yes.    Consequences of Substance Abuse:   Past Medical History:  Past Medical History:  Diagnosis Date   Anemia    Anxiety    Bipolar 1 disorder (HCC)    Collagen vascular disease (HCC)    rhematoid arthritis   Constipation    Dyspnea    Fibromyalgia    GERD (gastroesophageal reflux disease)    Heart murmur    mild-asymptomatic   Hepatic steatosis    History of kidney stones    History of Roux-en-Y gastric bypass    Hypotension    Hypothyroidism    Morbid obesity with BMI of 50.0-59.9, adult (HCC)    Opioid abuse (HCC)    Osteoarthritis    Osteoporosis    PONV (postoperative nausea and vomiting)    nausea only   Rheumatic fever    Rheumatoid arthritis (HCC)    Small bowel obstruction (HCC)    Thyroid disease     Past Surgical History:  Procedure Laterality Date   CHOLECYSTECTOMY  2004   COLONOSCOPY N/A 07/16/2017   Procedure: COLONOSCOPY;  Surgeon: Scot Jun, MD;  Location: [facility] ENDOSCOPY;  Service: Endoscopy;  Laterality: N/A;   EXPLORATORY LAPAROTOMY  2009   GASTRIC  BYPASS  2003   [facility]   HEEL SPUR EXCISION Bilateral    KNEE ARTHROSCOPY WITH SUBCHONDROPLASTY Left 09/27/2021   Procedure: Left knee subchondroplasty medial tibial plateau;  Surgeon: Kennedy Bucker, MD;  Location: [facility] ORS;  Service: Orthopedics;  Laterality: Left;   LAPAROTOMY N/A 03/01/2018   Procedure: EXPLORATORY LAPAROTOMY/ UMBILECTOMY;  Surgeon: Earline Mayotte, MD;  Location: [facility] ORS;  Service: General;  Laterality: N/A;   TONSILLECTOMY     VENTRAL HERNIA REPAIR N/A 03/07/2018   Procedure: HERNIA REPAIR VENTRAL ADULT;  Surgeon: Earline Mayotte, MD;  Location: [facility] ORS;  Service: General;  Laterality: N/A;    Family Psychiatric History:   Family  History:  Family History  Problem Relation Age of Onset   Depression Mother    Dementia Mother    Heart disease Mother    Osteoporosis Mother    Parkinson's disease Father    Liver disease Brother    Colon cancer Neg Hx     Social History:   Social History   Socioeconomic History   Marital status: Married    Spouse name: Not on file   Number of children: Not on file   Years of education: Not on file   Highest education level: Not on file  Occupational History   Not on file  Tobacco Use   Smoking status: Never    Passive exposure: Past   Smokeless tobacco: Never  Vaping Use   Vaping status: Never Used  Substance and Sexual Activity   Alcohol use: Not Currently   Drug use: No   Sexual activity: Yes  Other Topics Concern   Not on file  Social History Narrative   Not on file   Social Drivers of Health   Financial Resource Strain: Low Risk  (12/13/2022)   Received from [facility], [facility]   Overall Financial Resource Strain (CARDIA)    Difficulty of Paying Living Expenses: Not hard at all  Food Insecurity: No Food Insecurity (12/13/2022)   Received from [facility], [facility]   Hunger Vital Sign    Worried About Running Out of Food in the Last Year: Never true    Ran Out of Food in the Last Year: Never true  Transportation Needs: No Transportation Needs (12/13/2022)   Received from [facility], [facility]   [facility] - Transportation    In the past 12 months, has lack of transportation kept you from medical appointments or from getting medications?: No    Lack of Transportation (Non-Medical): No  Physical Activity: Insufficiently Active (12/13/2022)   Received from [facility], [facility]   Exercise Vital Sign    Days of Exercise per Week: 1 day    Minutes of Exercise per Session: 10 min  Stress: Stress Concern Present  (12/13/2022)   Received from [facility], [facility]   [employer] [facility] - Occupational Stress Questionnaire    Feeling of Stress : To some extent  Social Connections: Socially Isolated (12/13/2022)   Received from [facility], [facility]   Social Connection and Isolation Panel [NHANES]    Frequency of Communication with Friends and Family: Never    Frequency of Social Gatherings with Friends and Family: Never    Attends Religious Services: Never    Database administrator or Organizations: No    Attends Club or  Organization Meetings: Never    Marital Status: Married    Additional Social History:   Allergies:   Allergies  Allergen Reactions   Lactose Intolerance (Gi) Other (See Comments)    Bloating and GI distress   Sulfa Antibiotics Hives    Metabolic Disorder Labs: Lab Results  Component Value Date   HGBA1C 5.0 09/01/2021   No results found for: "PROLACTIN" Lab Results  Component Value Date   CHOL 169 05/26/2020   TRIG 136 05/26/2020   HDL 73 05/26/2020   LDLCALC 73 05/26/2020     Current Medications: Current Outpatient Medications  Medication Sig Dispense Refill   acetaminophen (TYLENOL) 500 MG tablet Take 500-1,000 mg by mouth every 6 (six) hours as needed (pain.).     albuterol (VENTOLIN HFA) 108 (90 Base) MCG/ACT inhaler Inhale 2 puffs into the lungs every 6 (six) hours as needed for wheezing or shortness of breath. (Patient not taking: Reported on 07/03/2023) 8 g 3   benzonatate (TESSALON) 100 MG capsule Take 1-2 tablets 3 times a day as needed for cough (Patient not taking: Reported on 07/03/2023) 30 capsule 0   Budeson-Glycopyrrol-Formoterol (BREZTRI AEROSPHERE) 160-9-4.8 MCG/ACT AERO Inhale 2 puffs into the lungs 2 (two) times daily. 10.7 g 11   busPIRone (BUSPAR) 30 MG tablet 1 bid 180 tablet 2   Calcium Carbonate (CALCIUM 600 PO) Take 600 mg by mouth in the morning  and at bedtime.     chlorpheniramine-HYDROcodone (TUSSIONEX) 10-8 MG/5ML Take 5 mLs by mouth every 12 (twelve) hours as needed for cough. (Patient not taking: Reported on 07/03/2023) 140 mL 0   Cholecalciferol 125 MCG (5000 UT) TABS Take 5,000 Units by mouth in the morning.     clonazePAM (KLONOPIN) 0.5 MG tablet TAKE 1 TABLET BY MOUTH ONCE DAILY AS NEEDED FOR  ACUTE  ANXIETY. Pt may take 1 extra tablet (0.5 mg) PRN as needed for severe anxiety. 35 tablet 5   Coenzyme Q10 300 MG CAPS Take 300 mg by mouth in the morning.     cyanocobalamin (,VITAMIN B-12,) 1000 MCG/ML injection Inject 1,000 mcg into the muscle every 30 (thirty) days.      denosumab (PROLIA) 60 MG/ML SOSY injection Inject 60 mg into the skin every 6 (six) months.     desipramine (NORPRAMIN) 25 MG tablet Take 1 tablet (25 mg total) by mouth daily. 90 tablet 3   hydroxychloroquine (PLAQUENIL) 200 MG tablet TAKE 1 TABLET TWICE A DAY 180 tablet 0   hydrOXYzine (ATARAX) 50 MG tablet Take 1 tablet (50 mg total) by mouth 3 (three) times daily as needed for anxiety. 270 tablet 2   ipratropium-albuterol (DUONEB) 0.5-2.5 (3) MG/3ML SOLN Take 3 mLs by nebulization every 4 (four) hours as needed. 360 mL 1   Krill Oil 500 MG CAPS Take 500 mg by mouth daily at 12 noon.     lamoTRIgine (LAMICTAL) 150 MG tablet Take 1 tablet (150 mg total) by mouth 2 (two) times daily. 180 tablet 1   levothyroxine (SYNTHROID) 112 MCG tablet TAKE 1 TABLET BY MOUTH ONCE DAILY IN THE MORNING BEFORE BREAKFAST 90 tablet 0   loratadine (CLARITIN) 10 MG tablet Take 10 mg by mouth daily as needed for allergies.     magnesium oxide (MAG-OX) 400 MG tablet Take 400 mg by mouth every evening.     metoCLOPramide (REGLAN) 5 MG tablet Take 1 tablet (5 mg total) by mouth at bedtime. 90 tablet 5   Multiple Vitamin (MULTIVITAMIN) tablet Take 1 tablet by  mouth every evening.     mupirocin ointment (BACTROBAN) 2 % Apply 1 Application topically 2 (two) times daily. (Patient not taking:  Reported on 07/03/2023) 30 g 3   Nebulizers (COMPRESSOR/NEBULIZER) MISC Dispense 1 machine with associated mask and tubing 1 each 0   nitrofurantoin, macrocrystal-monohydrate, (MACROBID) 100 MG capsule Take 1 capsule (100 mg total) by mouth 2 (two) times daily. (Patient not taking: Reported on 07/03/2023) 14 capsule 0   nystatin (MYCOSTATIN/NYSTOP) powder APPLY 1 APPLICATION        TOPICALLY TWO TIMES A DAY 30 g 6   omeprazole (PRILOSEC) 40 MG capsule Take 1 capsule (40 mg total) by mouth daily. 30 capsule 3   polyethylene glycol (MIRALAX / GLYCOLAX) 17 g packet Take 17 g by mouth in the morning and at bedtime.     psyllium (METAMUCIL) 58.6 % packet Take 1 packet by mouth in the morning.     sucralfate (CARAFATE) 1 g tablet Take 1 tablet (1 g total) by mouth 2 (two) times daily. 180 tablet 1   tiZANidine (ZANAFLEX) 4 MG tablet TAKE 1 TABLET 3 TIMES A DAY 270 tablet 0   venlafaxine (EFFEXOR) 100 MG tablet Take 1 tablet (100 mg total) by mouth 3 (three) times daily with meals. 270 tablet 2   vitamin C (ASCORBIC ACID) 500 MG tablet Take 500 mg by mouth in the morning.     zinc gluconate 50 MG tablet Take 50 mg by mouth in the morning.     No current facility-administered medications for this visit.    Neurologic: Headache: No Seizure: No Paresthesias:NA  Musculoskeletal: Strength & Muscle Tone: within normal limits Gait & Station: normal Patient leans: N/A  Psychiatric Specialty Exam: ROS  Blood pressure (!) 152/82, pulse 79, height 5\' 5"  (1.651 m), weight (!) 350 lb (158.8 kg).Body mass index is 58.24 kg/m.  General Appearance: Bizarre  Eye Contact:  Good  Speech:  Normal Rate  Volume:  Normal  Mood:  Anxious  Affect:  Congruent  Thought Process:  Goal Directed  Orientation:  NA  Thought Content:  Logical  Suicidal Thoughts:  No  Homicidal Thoughts:  No  Memory:  Negative  Judgement:  Fair  Insight:  NA and Good  Psychomotor Activity:  Normal  Concentration:    Recall:  Good   Fund of Knowledge:  Language: Good  Akathisia:  No  Handed:  Right  AIMS (if indicated):    Assets:  Desire for Improvement  ADL's:  Intact  Cognition: WNL  Sleep:      Treatment Plan Summary:   This patient's diagnosis is major depression.  She takes Effexor and desipramine for this condition.  Her second problem is an adjustment disorder with an anxious mood state.  She takes BuSpar 30 mg twice daily and Vistaril.  She also takes Lamictal probably for mood stability.  At this time the patient is reasonably stable.  She also takes Klonopin 0.5 mg once a day and 1 extra as needed if needed.  The patient be seen again in 4 months.  The patient's pharmacy is changed and she will call us back giving [place] the location of the pharmacy and we will send all refills to this new pharmacy.  The patient is functioning reasonably well.

## 2023-07-21 ENCOUNTER — Other Ambulatory Visit: Payer: Self-pay | Admitting: Physician Assistant

## 2023-07-21 DIAGNOSIS — E039 Hypothyroidism, unspecified: Secondary | ICD-10-CM

## 2023-07-23 ENCOUNTER — Ambulatory Visit: Payer: Medicare HMO

## 2023-07-23 ENCOUNTER — Other Ambulatory Visit: Payer: Medicare HMO

## 2023-07-23 ENCOUNTER — Ambulatory Visit: Payer: Medicare HMO | Admitting: Oncology

## 2023-07-31 ENCOUNTER — Other Ambulatory Visit (HOSPITAL_COMMUNITY): Payer: Self-pay | Admitting: Psychiatry

## 2023-07-31 DIAGNOSIS — F411 Generalized anxiety disorder: Secondary | ICD-10-CM

## 2023-08-03 ENCOUNTER — Other Ambulatory Visit (HOSPITAL_COMMUNITY): Payer: Self-pay

## 2023-08-06 ENCOUNTER — Other Ambulatory Visit (HOSPITAL_COMMUNITY): Payer: Self-pay | Admitting: *Deleted

## 2023-08-06 DIAGNOSIS — F411 Generalized anxiety disorder: Secondary | ICD-10-CM

## 2023-08-06 MED ORDER — HYDROXYZINE HCL 50 MG PO TABS: 50.0000 mg | ORAL_TABLET | Freq: Three times a day (TID) | ORAL | 1 refills | Status: DC | PRN

## 2023-08-08 ENCOUNTER — Other Ambulatory Visit (HOSPITAL_COMMUNITY): Payer: Self-pay | Admitting: *Deleted

## 2023-08-08 DIAGNOSIS — F411 Generalized anxiety disorder: Secondary | ICD-10-CM

## 2023-08-08 MED ORDER — BUSPIRONE HCL 15 MG PO TABS: ORAL_TABLET | ORAL | 2 refills | Status: DC

## 2023-08-08 MED ORDER — DESIPRAMINE HCL 25 MG PO TABS: 25.0000 mg | ORAL_TABLET | Freq: Every day | ORAL | 0 refills | Status: DC

## 2023-08-08 NOTE — Progress Notes (Deleted)
Office Visit Note  Patient: Jamie Bennett             Date of Birth: 1961/05/07           MRN: [id number]             PCP: Carlean Jews, PA-C Referring: Carlean Jews, PA* Visit Date: 08/22/2023 Occupation: @GUAROCC @  Subjective:  No chief complaint on file.   History of Present Illness: Jamie Bennett is a 63 y.o. female ***     Activities of Daily Living:  Patient reports morning stiffness for *** {minute/hour:19697}.   Patient {ACTIONS;DENIES/REPORTS:21021675::"Denies"} nocturnal pain.  Difficulty dressing/grooming: {ACTIONS;DENIES/REPORTS:21021675::"Denies"} Difficulty climbing stairs: {ACTIONS;DENIES/REPORTS:21021675::"Denies"} Difficulty getting out of chair: {ACTIONS;DENIES/REPORTS:21021675::"Denies"} Difficulty using hands for taps, buttons, cutlery, and/or writing: {ACTIONS;DENIES/REPORTS:21021675::"Denies"}  No Rheumatology ROS completed.   PMFS History:  Patient Active Problem List   Diagnosis Date Noted   Moderate persistent asthma with acute exacerbation 08/03/2022   Chronic knee pain (1ry area of Pain) (Left) 04/19/2022   Tricompartment osteoarthritis of knee (Left) 04/19/2022   Primary osteoarthritis of knee (Left) 04/19/2022   Chronic anticoagulation (Plaquenil) 04/19/2022   History of attempted suicide (07/10/2016) 04/18/2022   History of drug overdose (11/08/2015) 04/18/2022   Chronic pain syndrome 04/17/2022   Pharmacologic therapy 04/17/2022   Disorder of skeletal system 04/17/2022   Problems influencing health status 04/17/2022   Neck pain, musculoskeletal 03/24/2020   Bipolar affective disorder, current episode mixed (HCC) 11/09/2019   Encounter for general adult medical examination with abnormal findings 06/15/2019   Closed fracture of elbow (Left) 06/15/2019   Urinary tract infection without hematuria 06/15/2019   Encounter for long-term (current) use of medications 06/15/2019   Acute upper respiratory infection  06/06/2019   Exposure to COVID-19 virus 06/06/2019   BMI 50.0-59.9, adult (HCC) 06/03/2019   Exercise-induced asthma 12/15/2018   Screening for breast cancer 05/29/2018   Duodenal ulcer disease 05/29/2018   Screening for malignant neoplasm of cervix 05/29/2018   Dysuria 05/29/2018   Surgical wound dehiscence, initial encounter 04/14/2018   Postoperative abdominal hernia with obstruction    Incarcerated ventral hernia 03/22/2018   SBO (small bowel obstruction) (HCC) 03/07/2018   Persistent umbilical sinus 02/22/2018   Atopic dermatitis 12/05/2017   Adjustment disorder with mixed anxiety and depressed mood 03/10/2017   Major depressive disorder 03/10/2017   Primary osteoarthritis of knees (Bilateral) 01/19/2017   Suicide attempt (HCC) 07/10/2016   Fibromyalgia 07/10/2016   High risk medication use 07/10/2016   Hypotension 11/09/2015   Respiratory failure (HCC)    Drug overdose (11/08/2015) (undetermined intent) 11/08/2015   Fatty infiltration of liver 02/16/2015   Hepatic fibrosis 02/16/2015   Iron deficiency anemia 02/05/2015   Vitamin B 12 deficiency 02/05/2015   OP (osteoporosis) 06/16/2014   Rheumatoid arteritis (HCC) 03/02/2014   Hypothyroidism 03/02/2014   Rheumatic fever without heart involvement 03/02/2014   Adult hypothyroidism 03/02/2014   Arthritis of pelvic region, degenerative 03/02/2014   History of bariatric surgery 11/24/2013    Class: History of   Bipolar affective disorder (HCC) 11/24/2013   Rheumatoid arthritis (HCC) 11/24/2013   Polysubstance (excluding opioids) dependence (HCC) 09/11/2013   Polysubstance dependence (HCC) 09/11/2013   Combined drug dependence excluding opioids (HCC) 09/11/2013   Arthritis or polyarthritis, rheumatoid (HCC) 09/05/2013   Bipolar 1 disorder, depressed (HCC) 09/05/2013    Past Medical History:  Diagnosis Date   Anemia    Anxiety    Bipolar 1 disorder (HCC)    Collagen vascular disease (HCC)  rhematoid arthritis    Constipation    Dyspnea    Fibromyalgia    GERD (gastroesophageal reflux disease)    Heart murmur    mild-asymptomatic   Hepatic steatosis    History of kidney stones    History of Roux-en-Y gastric bypass    Hypotension    Hypothyroidism    Morbid obesity with BMI of 50.0-59.9, adult (HCC)    Opioid abuse (HCC)    Osteoarthritis    Osteoporosis    PONV (postoperative nausea and vomiting)    nausea only   Rheumatic fever    Rheumatoid arthritis (HCC)    Small bowel obstruction (HCC)    Thyroid disease     Family History  Problem Relation Age of Onset   Depression Mother    Dementia Mother    Heart disease Mother    Osteoporosis Mother    Parkinson's disease Father    Liver disease Brother    Colon cancer Neg Hx    Past Surgical History:  Procedure Laterality Date   CHOLECYSTECTOMY  2004   COLONOSCOPY N/A 07/16/2017   Procedure: COLONOSCOPY;  Surgeon: Scot Jun, MD;  Location: [facility] ENDOSCOPY;  Service: Endoscopy;  Laterality: N/A;   EXPLORATORY LAPAROTOMY  2009   GASTRIC BYPASS  2003   [facility]   HEEL SPUR EXCISION Bilateral    KNEE ARTHROSCOPY WITH SUBCHONDROPLASTY Left 09/27/2021   Procedure: Left knee subchondroplasty medial tibial plateau;  Surgeon: Kennedy Bucker, MD;  Location: [facility] ORS;  Service: Orthopedics;  Laterality: Left;   LAPAROTOMY N/A 03/01/2018   Procedure: EXPLORATORY LAPAROTOMY/ UMBILECTOMY;  Surgeon: Earline Mayotte, MD;  Location: [facility] ORS;  Service: General;  Laterality: N/A;   TONSILLECTOMY     VENTRAL HERNIA REPAIR N/A 03/07/2018   Procedure: HERNIA REPAIR VENTRAL ADULT;  Surgeon: Earline Mayotte, MD;  Location: [facility] ORS;  Service: General;  Laterality: N/A;   Social History   Social History Narrative   Not on file   Immunization History  Administered Date(s) Administered   Influenza Inj Mdck Quad Pf 06/09/2022   Influenza, Quadrivalent, Recombinant, Inj, Pf 04/26/2019   Influenza,inj,Quad PF,6+ Mos 04/24/2016    Influenza-Unspecified 04/19/2021   Moderna SARS-COV2 Booster Vaccination 01/30/2021, 05/13/2021   Moderna Sars-Covid-2 Vaccination 10/25/2019, 11/22/2019, 06/02/2020   Pneumococcal Polysaccharide-23 03/02/2018     Objective: Vital Signs: There were no vitals taken for this visit.   Physical Exam   Musculoskeletal Exam: ***  CDAI Exam: CDAI Score: -- Patient Global: --; Provider Global: -- Swollen: --; Tender: -- Joint Exam 08/22/2023   No joint exam has been documented for this visit   There is currently no information documented on the homunculus. Go to the Rheumatology activity and complete the homunculus joint exam.  Investigation: No additional findings.  Imaging: No results found.  Recent Labs: Lab Results  Component Value Date   WBC 6.3 07/03/2023   HGB 14.0 07/03/2023   PLT 248 07/03/2023   NA 141 07/03/2023   K 4.0 07/03/2023   CL 105 07/03/2023   CO2 26 07/03/2023   GLUCOSE 80 07/03/2023   BUN 27 (H) 07/03/2023   CREATININE 0.85 07/03/2023   BILITOT 0.4 07/03/2023   ALKPHOS 105 01/10/2022   AST 18 07/03/2023   ALT 17 07/03/2023   PROT 7.3 07/03/2023   ALBUMIN 3.9 01/10/2022   CALCIUM 9.6 07/03/2023   GFRAA 99 05/26/2020    Speciality Comments: PLQ Eye Exam: 06/06/2023 WNL @ [facility] Follow up 1 year  Procedures:  No procedures performed Allergies: Lactose intolerance (gi) and Sulfa antibiotics   Assessment / Plan:     Visit Diagnoses: No diagnosis found.  Orders: No orders of the defined types were placed in this encounter.  No orders of the defined types were placed in this encounter.   Face-to-face time spent with patient was *** minutes. Greater than 50% of time was spent in counseling and coordination of care.  Follow-Up Instructions: No follow-ups on file.   Pollyann Savoy, MD  Note - This record has been created using Animal nutritionist.  Chart creation errors have been sought, but may not always  have been located.  Such creation errors do not reflect on  the standard of medical care.

## 2023-08-12 ENCOUNTER — Encounter: Payer: Self-pay | Admitting: Hematology and Oncology

## 2023-08-14 ENCOUNTER — Encounter: Payer: Self-pay | Admitting: Hematology and Oncology

## 2023-08-14 ENCOUNTER — Other Ambulatory Visit (HOSPITAL_COMMUNITY): Payer: Self-pay

## 2023-08-14 ENCOUNTER — Other Ambulatory Visit (HOSPITAL_BASED_OUTPATIENT_CLINIC_OR_DEPARTMENT_OTHER): Payer: Self-pay

## 2023-08-14 DIAGNOSIS — F411 Generalized anxiety disorder: Secondary | ICD-10-CM

## 2023-08-14 DIAGNOSIS — F603 Borderline personality disorder: Secondary | ICD-10-CM

## 2023-08-14 DIAGNOSIS — F332 Major depressive disorder, recurrent severe without psychotic features: Secondary | ICD-10-CM

## 2023-08-14 MED ORDER — LAMOTRIGINE 150 MG PO TABS
150.0000 mg | ORAL_TABLET | Freq: Two times a day (BID) | ORAL | 0 refills | Status: DC
  Filled 2023-08-14 – 2023-10-22 (×2): qty 60, 30d supply, fill #0

## 2023-08-14 MED ORDER — VENLAFAXINE HCL 100 MG PO TABS
100.0000 mg | ORAL_TABLET | Freq: Three times a day (TID) | ORAL | 0 refills | Status: DC
  Filled 2023-08-14: qty 90, 30d supply, fill #0

## 2023-08-14 MED ORDER — BUSPIRONE HCL 15 MG PO TABS
30.0000 mg | ORAL_TABLET | Freq: Two times a day (BID) | ORAL | 0 refills | Status: DC
  Filled 2023-08-14 – 2023-10-22 (×2): qty 120, 30d supply, fill #0

## 2023-08-15 ENCOUNTER — Other Ambulatory Visit: Payer: Self-pay

## 2023-08-15 ENCOUNTER — Other Ambulatory Visit (HOSPITAL_BASED_OUTPATIENT_CLINIC_OR_DEPARTMENT_OTHER): Payer: Self-pay

## 2023-08-15 ENCOUNTER — Other Ambulatory Visit (HOSPITAL_COMMUNITY): Payer: Self-pay

## 2023-08-16 ENCOUNTER — Other Ambulatory Visit: Payer: Self-pay

## 2023-08-16 ENCOUNTER — Other Ambulatory Visit (HOSPITAL_BASED_OUTPATIENT_CLINIC_OR_DEPARTMENT_OTHER): Payer: Self-pay

## 2023-08-16 ENCOUNTER — Other Ambulatory Visit (HOSPITAL_COMMUNITY): Payer: Self-pay

## 2023-08-16 DIAGNOSIS — D509 Iron deficiency anemia, unspecified: Secondary | ICD-10-CM

## 2023-08-16 DIAGNOSIS — E538 Deficiency of other specified B group vitamins: Secondary | ICD-10-CM

## 2023-08-16 DIAGNOSIS — D649 Anemia, unspecified: Secondary | ICD-10-CM

## 2023-08-16 MED ORDER — CLONAZEPAM 0.5 MG PO TABS
0.5000 mg | ORAL_TABLET | Freq: Every day | ORAL | 5 refills | Status: DC | PRN
  Filled 2023-09-29: qty 35, 17d supply, fill #0
  Filled 2023-10-19: qty 35, 17d supply, fill #1

## 2023-08-16 MED ORDER — HYDROXYZINE HCL 50 MG PO TABS: 50.0000 mg | ORAL_TABLET | Freq: Three times a day (TID) | ORAL | 2 refills | Status: DC | PRN

## 2023-08-16 MED ORDER — LAMOTRIGINE 150 MG PO TABS
150.0000 mg | ORAL_TABLET | Freq: Two times a day (BID) | ORAL | 1 refills | Status: DC
  Filled 2023-09-23: qty 180, 90d supply, fill #0

## 2023-08-16 MED ORDER — TRAZODONE HCL 100 MG PO TABS
100.0000 mg | ORAL_TABLET | Freq: Every evening | ORAL | 5 refills | Status: DC | PRN
  Filled 2023-11-22: qty 60, 30d supply, fill #0

## 2023-08-16 MED ORDER — VENLAFAXINE HCL 100 MG PO TABS
100.0000 mg | ORAL_TABLET | Freq: Three times a day (TID) | ORAL | 2 refills | Status: DC
  Filled 2023-10-27: qty 270, 90d supply, fill #0

## 2023-08-17 ENCOUNTER — Encounter: Payer: Self-pay | Admitting: Oncology

## 2023-08-17 ENCOUNTER — Encounter: Payer: Self-pay | Admitting: Hematology and Oncology

## 2023-08-17 ENCOUNTER — Inpatient Hospital Stay (HOSPITAL_BASED_OUTPATIENT_CLINIC_OR_DEPARTMENT_OTHER): Payer: PPO | Admitting: Oncology

## 2023-08-17 ENCOUNTER — Inpatient Hospital Stay: Payer: PPO | Attending: Oncology

## 2023-08-17 ENCOUNTER — Inpatient Hospital Stay: Payer: PPO

## 2023-08-17 ENCOUNTER — Other Ambulatory Visit (HOSPITAL_COMMUNITY): Payer: Self-pay

## 2023-08-17 VITALS — BP 147/39 | HR 71 | Temp 98.0°F | Resp 19 | Wt 349.0 lb

## 2023-08-17 DIAGNOSIS — E538 Deficiency of other specified B group vitamins: Secondary | ICD-10-CM | POA: Diagnosis not present

## 2023-08-17 DIAGNOSIS — Z9884 Bariatric surgery status: Secondary | ICD-10-CM | POA: Diagnosis not present

## 2023-08-17 DIAGNOSIS — M059 Rheumatoid arthritis with rheumatoid factor, unspecified: Secondary | ICD-10-CM | POA: Insufficient documentation

## 2023-08-17 DIAGNOSIS — D649 Anemia, unspecified: Secondary | ICD-10-CM

## 2023-08-17 DIAGNOSIS — D509 Iron deficiency anemia, unspecified: Secondary | ICD-10-CM | POA: Insufficient documentation

## 2023-08-17 LAB — CBC (CANCER CENTER ONLY)
HCT: 39.6 % (ref 36.0–46.0)
Hemoglobin: 13.1 g/dL (ref 12.0–15.0)
MCH: 30.4 pg (ref 26.0–34.0)
MCHC: 33.1 g/dL (ref 30.0–36.0)
MCV: 91.9 fL (ref 80.0–100.0)
Platelet Count: 205 10*3/uL (ref 150–400)
RBC: 4.31 MIL/uL (ref 3.87–5.11)
RDW: 13.2 % (ref 11.5–15.5)
WBC Count: 6.5 10*3/uL (ref 4.0–10.5)
nRBC: 0 % (ref 0.0–0.2)

## 2023-08-17 LAB — IRON AND TIBC
Iron: 104 ug/dL (ref 28–170)
Saturation Ratios: 20 % (ref 10.4–31.8)
TIBC: 518 ug/dL — ABNORMAL HIGH (ref 250–450)
UIBC: 414 ug/dL

## 2023-08-17 LAB — VITAMIN B12: Vitamin B-12: 911 pg/mL (ref 180–914)

## 2023-08-17 LAB — FERRITIN: Ferritin: 50 ng/mL (ref 11–307)

## 2023-08-17 MED ORDER — CYANOCOBALAMIN 1000 MCG/ML IJ SOLN
1000.0000 ug | Freq: Once | INTRAMUSCULAR | Status: AC
  Administered 2023-08-17: 1000 ug via INTRAMUSCULAR
  Filled 2023-08-17: qty 1

## 2023-08-18 ENCOUNTER — Encounter: Payer: Self-pay | Admitting: Hematology and Oncology

## 2023-08-18 NOTE — Progress Notes (Signed)
Hematology/Oncology Consult note [facility]  Telephone:([phone] [phone]  Patient Care Team: Alan Ripper as PCP - General (Physician Assistant) Creig Hines, MD as Consulting Physician (Oncology)   Name of the patient: Jamie Bennett  [id number]  [birth year]   Date of visit: 08/18/23  Diagnosis- iron and b12 deficiency anemia  Chief complaint/ Reason for visit- routine f/u of anemia  Heme/Onc history:  Patient is a 63 year old female with history of gastric bypass surgery in 2003.  She is subsequent developed iron and B12 deficiency and has been getting IV iron from time to time.   Interval history- she is feeling well overall.  Denies any blood loss in her stool or urine.She has seropositive rheumatoid arthritis for which she is on Plaquenil.does have joint pain especially in her hands  ECOG PS- 1 Pain scale- 0   Review of systems- Review of Systems  Constitutional:  Positive for malaise/fatigue.  Musculoskeletal:  Positive for joint pain.      Allergies  Allergen Reactions   Lactose Intolerance (Gi) Other (See Comments)    Bloating and GI distress   Sulfa Antibiotics Hives     Past Medical History:  Diagnosis Date   Anemia    Anxiety    Bipolar 1 disorder (HCC)    Collagen vascular disease (HCC)    rhematoid arthritis   Constipation    Dyspnea    Fibromyalgia    GERD (gastroesophageal reflux disease)    Heart murmur    mild-asymptomatic   Hepatic steatosis    History of kidney stones    History of Roux-en-Y gastric bypass    Hypotension    Hypothyroidism    Morbid obesity with BMI of 50.0-59.9, adult (HCC)    Opioid abuse (HCC)    Osteoarthritis    Osteoporosis    PONV (postoperative nausea and vomiting)    nausea only   Rheumatic fever    Rheumatoid arthritis (HCC)    Small bowel obstruction (HCC)    Thyroid disease      Past Surgical History:  Procedure Laterality Date    CHOLECYSTECTOMY  2004   COLONOSCOPY N/A 07/16/2017   Procedure: COLONOSCOPY;  Surgeon: Scot Jun, MD;  Location: [facility] ENDOSCOPY;  Service: Endoscopy;  Laterality: N/A;   EXPLORATORY LAPAROTOMY  2009   GASTRIC BYPASS  2003   [facility]   HEEL SPUR EXCISION Bilateral    KNEE ARTHROSCOPY WITH SUBCHONDROPLASTY Left 09/27/2021   Procedure: Left knee subchondroplasty medial tibial plateau;  Surgeon: Kennedy Bucker, MD;  Location: [facility] ORS;  Service: Orthopedics;  Laterality: Left;   LAPAROTOMY N/A 03/01/2018   Procedure: EXPLORATORY LAPAROTOMY/ UMBILECTOMY;  Surgeon: Earline Mayotte, MD;  Location: [facility] ORS;  Service: General;  Laterality: N/A;   TONSILLECTOMY     VENTRAL HERNIA REPAIR N/A 03/07/2018   Procedure: HERNIA REPAIR VENTRAL ADULT;  Surgeon: Earline Mayotte, MD;  Location: [facility] ORS;  Service: General;  Laterality: N/A;    Social History   Socioeconomic History   Marital status: Married    Spouse name: Not on file   Number of children: Not on file   Years of education: Not on file   Highest education level: Not on file  Occupational History   Not on file  Tobacco Use   Smoking status: Never    Passive exposure: Past   Smokeless tobacco: Never  Vaping Use   Vaping status: Never Used  Substance and Sexual Activity  Alcohol use: Not Currently   Drug use: No   Sexual activity: Yes  Other Topics Concern   Not on file  Social History Narrative   Not on file   Social Drivers of Health   Financial Resource Strain: Low Risk  (12/13/2022)   Received from [facility], [facility]   Overall Financial Resource Strain (CARDIA)    Difficulty of Paying Living Expenses: Not hard at all  Food Insecurity: No Food Insecurity (12/13/2022)   Received from [facility], [facility]   Hunger Vital Sign    Worried About Running Out of Food in the Last Year: Never true    Ran Out of Food in the Last  Year: Never true  Transportation Needs: No Transportation Needs (12/13/2022)   Received from [facility], [facility]   [facility] - Transportation    In the past 12 months, has lack of transportation kept you from medical appointments or from getting medications?: No    Lack of Transportation (Non-Medical): No  Physical Activity: Insufficiently Active (12/13/2022)   Received from [facility], [facility]   Exercise Vital Sign    Days of Exercise per Week: 1 day    Minutes of Exercise per Session: 10 min  Stress: Stress Concern Present (12/13/2022)   Received from [facility], [facility]   [employer] [facility] Occupational [facility] - Occupational Stress Questionnaire    Feeling of Stress : To some extent  Social Connections: Socially Isolated (12/13/2022)   Received from [facility], [facility]   Social Connection and Isolation Panel [NHANES]    Frequency of Communication with Friends and Family: Never    Frequency of Social Gatherings with Friends and Family: Never    Attends Religious Services: Never    Database administrator or Organizations: No    Attends Banker Meetings: Never    Marital Status: Married  Catering manager Violence: Unknown (05/11/2022)   Received from [employer] [employer]   Abuse Screen    Unsafe at Home or Work/School: Not on file    Feels Threatened by Someone?: Not on file    Does Anyone Keep You from [employer] Others or Doint Things Outside the Home?: Not on file    Physical Sign of Abuse Present: Not on file    Family History  Problem Relation Age of Onset   Depression Mother    Dementia Mother    Heart disease Mother    Osteoporosis Mother    Parkinson's disease Father    Liver disease Brother    Colon cancer Neg Hx      Current Outpatient Medications:     acetaminophen (TYLENOL) 500 MG tablet, Take 500-1,000 mg by mouth every 6 (six) hours as needed (pain.)., Disp: , Rfl:    albuterol (VENTOLIN HFA) 108 (90 Base) MCG/ACT inhaler, Inhale 2 puffs into the lungs every 6 (six) hours as needed for wheezing or shortness of breath. (Patient not taking: Reported on 07/03/2023), Disp: 8 g, Rfl: 3   benzonatate (TESSALON) 100 MG capsule, Take 1-2 tablets 3 times a day as needed for cough (Patient not taking: Reported on 07/03/2023), Disp: 30 capsule, Rfl: 0   Budeson-Glycopyrrol-Formoterol (BREZTRI AEROSPHERE) 160-9-4.8 MCG/ACT AERO, Inhale 2 puffs into the lungs 2 (two) times daily., Disp: 10.7 g, Rfl: 11   busPIRone (BUSPAR) 15 MG tablet,  Take 2 tablets (30 mg total) by mouth 2 (two) times daily., Disp: 120 tablet, Rfl: 0   Calcium Carbonate (CALCIUM 600 PO), Take 600 mg by mouth in the morning and at bedtime., Disp: , Rfl:    chlorpheniramine-HYDROcodone (TUSSIONEX) 10-8 MG/5ML, Take 5 mLs by mouth every 12 (twelve) hours as needed for cough. (Patient not taking: Reported on 07/03/2023), Disp: 140 mL, Rfl: 0   Cholecalciferol 125 MCG (5000 UT) TABS, Take 5,000 Units by mouth in the morning., Disp: , Rfl:    clonazePAM (KLONOPIN) 0.5 MG tablet, TAKE 1 TABLET BY MOUTH ONCE DAILY AS NEEDED FOR  ACUTE  ANXIETY. Pt may take 1 extra tablet (0.5 mg) PRN as needed for severe anxiety., Disp: 35 tablet, Rfl: 5   clonazePAM (KLONOPIN) 0.5 MG tablet, Take 1 tablet (0.5 mg total) by mouth daily as needed for acute anxiety. may take 1 extra tab as needed for severe anxiety., Disp: 35 tablet, Rfl: 5   Coenzyme Q10 300 MG CAPS, Take 300 mg by mouth in the morning., Disp: , Rfl:    cyanocobalamin (,VITAMIN B-12,) 1000 MCG/ML injection, Inject 1,000 mcg into the muscle every 30 (thirty) days. , Disp: , Rfl:    denosumab (PROLIA) 60 MG/ML SOSY injection, Inject 60 mg into the skin every 6 (six) months., Disp: , Rfl:    desipramine (NORPRAMIN) 25 MG tablet, Take 1 tablet (25 mg  total) by mouth daily., Disp: 100 tablet, Rfl: 0   hydroxychloroquine (PLAQUENIL) 200 MG tablet, TAKE 1 TABLET TWICE A DAY, Disp: 180 tablet, Rfl: 0   hydrOXYzine (ATARAX) 50 MG tablet, Take 1 tablet (50 mg total) by mouth 3 (three) times daily as needed for anxiety., Disp: 270 tablet, Rfl: 1   hydrOXYzine (ATARAX) 50 MG tablet, Take 1 tablet (50 mg total) by mouth 3 (three) times daily as needed for anxiety., Disp: 270 tablet, Rfl: 2   ipratropium-albuterol (DUONEB) 0.5-2.5 (3) MG/3ML SOLN, Take 3 mLs by nebulization every 4 (four) hours as needed., Disp: 360 mL, Rfl: 1   Krill Oil 500 MG CAPS, Take 500 mg by mouth daily at 12 noon., Disp: , Rfl:    lamoTRIgine (LAMICTAL) 150 MG tablet, Take 1 tablet (150 mg total) by mouth 2 (two) times daily., Disp: 60 tablet, Rfl: 0   lamoTRIgine (LAMICTAL) 150 MG tablet, Take 1 tablet (150 mg total) by mouth 2 (two) times daily., Disp: 180 tablet, Rfl: 1   levothyroxine (SYNTHROID) 112 MCG tablet, TAKE 1 TABLET BY MOUTH ONCE DAILY IN THE MORNING BEFORE BREAKFAST, Disp: 90 tablet, Rfl: 0   loratadine (CLARITIN) 10 MG tablet, Take 10 mg by mouth daily as needed for allergies., Disp: , Rfl:    magnesium oxide (MAG-OX) 400 MG tablet, Take 400 mg by mouth every evening., Disp: , Rfl:    metoCLOPramide (REGLAN) 5 MG tablet, Take 1 tablet (5 mg total) by mouth at bedtime., Disp: 90 tablet, Rfl: 5   Multiple Vitamin (MULTIVITAMIN) tablet, Take 1 tablet by mouth every evening., Disp: , Rfl:    mupirocin ointment (BACTROBAN) 2 %, Apply 1 Application topically 2 (two) times daily. (Patient not taking: Reported on 07/03/2023), Disp: 30 g, Rfl: 3   Nebulizers (COMPRESSOR/NEBULIZER) MISC, Dispense 1 machine with associated mask and tubing, Disp: 1 each, Rfl: 0   nitrofurantoin, macrocrystal-monohydrate, (MACROBID) 100 MG capsule, Take 1 capsule (100 mg total) by mouth 2 (two) times daily. (Patient not taking: Reported on 07/03/2023), Disp: 14 capsule, Rfl: 0   nystatin  (MYCOSTATIN/NYSTOP)  powder, APPLY 1 APPLICATION        TOPICALLY TWO TIMES A DAY, Disp: 30 g, Rfl: 6   omeprazole (PRILOSEC) 40 MG capsule, Take 1 capsule (40 mg total) by mouth daily., Disp: 30 capsule, Rfl: 3   polyethylene glycol (MIRALAX / GLYCOLAX) 17 g packet, Take 17 g by mouth in the morning and at bedtime., Disp: , Rfl:    psyllium (METAMUCIL) 58.6 % packet, Take 1 packet by mouth in the morning., Disp: , Rfl:    sucralfate (CARAFATE) 1 g tablet, Take 1 tablet (1 g total) by mouth 2 (two) times daily., Disp: 180 tablet, Rfl: 1   tiZANidine (ZANAFLEX) 4 MG tablet, TAKE 1 TABLET 3 TIMES A DAY, Disp: 270 tablet, Rfl: 0   traZODone (DESYREL) 100 MG tablet, Take 1 tablet (100 mg total) by mouth at bedtime as needed for sleep and may repeat dose one time if needed., Disp: 60 tablet, Rfl: 5   venlafaxine (EFFEXOR) 100 MG tablet, Take 1 tablet (100 mg total) by mouth 3 (three) times daily with meals., Disp: 90 tablet, Rfl: 0   venlafaxine (EFFEXOR) 100 MG tablet, Take 1 tablet (100 mg total) by mouth 3 (three) times daily with meals., Disp: 270 tablet, Rfl: 2   vitamin C (ASCORBIC ACID) 500 MG tablet, Take 500 mg by mouth in the morning., Disp: , Rfl:    zinc gluconate 50 MG tablet, Take 50 mg by mouth in the morning., Disp: , Rfl:   Physical exam:  Vitals:   08/17/23 1515  BP: (!) 147/39  Pulse: 71  Resp: 19  Temp: 98 F (36.7 C)  TempSrc: Tympanic  SpO2: 100%  Weight: (!) 349 lb (158.3 kg)   Physical Exam Cardiovascular:     Rate and Rhythm: Normal rate and regular rhythm.     Heart sounds: Normal heart sounds.  Pulmonary:     Effort: Pulmonary effort is normal.     Breath sounds: Normal breath sounds.  Skin:    General: Skin is warm and dry.  Neurological:     Mental Status: She is alert and oriented to person, place, and time.         Latest Ref Rng & Units 07/03/2023    3:48 PM  CMP  Glucose 65 - 99 mg/dL 80   BUN 7 - 25 mg/dL 27   Creatinine 1.61 - 1.05 mg/dL 0.96    Sodium 045 - 409 mmol/L 141   Potassium 3.5 - 5.3 mmol/L 4.0   Chloride 98 - 110 mmol/L 105   CO2 20 - 32 mmol/L 26   Calcium 8.6 - 10.4 mg/dL 9.6   Total Protein 6.1 - 8.1 g/dL 7.3   Total Bilirubin 0.2 - 1.2 mg/dL 0.4   AST 10 - 35 U/L 18   ALT 6 - 29 U/L 17       Latest Ref Rng & Units 08/17/2023    2:50 PM  CBC  WBC 4.0 - 10.5 K/uL 6.5   Hemoglobin 12.0 - 15.0 g/dL 81.1   Hematocrit 91.4 - 46.0 % 39.6   Platelets 150 - 400 K/uL 205     Assessment and plan- Patient is a 63 y.o. female here for routine follow-up Of iron deficiency anemia  Patient is not presently anemic with a hemoglobin that has remained stable between 13-14 over the last 1 year.  B12 levels are normal iron saturation is 20% with serum ferritin of 50.  She does not require any IV iron  at this time.  She will continue B12 injections [facility].  CBC ferritin and iron studies in 6 months in 1 year and I will see her back in 1 year   Visit Diagnosis 1. Vitamin B 12 deficiency   2. Iron deficiency anemia, unspecified iron deficiency anemia type      Dr. Owens Shark, MD, MPH [facility] at [facility] [phone] 08/18/2023 5:29 PM

## 2023-08-22 ENCOUNTER — Other Ambulatory Visit: Payer: Medicare HMO | Admitting: Rheumatology

## 2023-08-22 DIAGNOSIS — M0579 Rheumatoid arthritis with rheumatoid factor of multiple sites without organ or systems involvement: Secondary | ICD-10-CM

## 2023-08-22 DIAGNOSIS — M79641 Pain in right hand: Secondary | ICD-10-CM

## 2023-09-03 ENCOUNTER — Ambulatory Visit: Payer: PPO

## 2023-09-03 ENCOUNTER — Ambulatory Visit: Payer: PPO | Attending: Rheumatology | Admitting: Rheumatology

## 2023-09-03 DIAGNOSIS — M19042 Primary osteoarthritis, left hand: Secondary | ICD-10-CM

## 2023-09-03 DIAGNOSIS — M19041 Primary osteoarthritis, right hand: Secondary | ICD-10-CM | POA: Diagnosis not present

## 2023-09-03 NOTE — Progress Notes (Signed)
Visit diagnosis: Pain in both hands Patient was here to get limited ultrasound examination of bilateral hands to look for synovitis.  Ultrasound examination of bilateral hands was performed per EULAR recommendations. Using 15 MHz transducer, grayscale and power Doppler bilateral second and third MCP joints both dorsal and volar aspects were evaluated to look for synovitis or tenosynovitis. The findings were there was no synovitis or tenosynovitis on the examination.  Impression: No synovitis was noted on the ultrasound examination.  Findings were discussed with the patient.  Pollyann Savoy, MD

## 2023-09-05 ENCOUNTER — Other Ambulatory Visit (HOSPITAL_COMMUNITY): Payer: Self-pay | Admitting: Pharmacy Technician

## 2023-09-05 ENCOUNTER — Encounter (HOSPITAL_COMMUNITY): Payer: Self-pay

## 2023-09-05 ENCOUNTER — Other Ambulatory Visit (HOSPITAL_BASED_OUTPATIENT_CLINIC_OR_DEPARTMENT_OTHER): Payer: Self-pay

## 2023-09-05 ENCOUNTER — Other Ambulatory Visit: Payer: Self-pay

## 2023-09-05 ENCOUNTER — Other Ambulatory Visit (HOSPITAL_COMMUNITY): Payer: Self-pay

## 2023-09-05 MED ORDER — PROLIA 60 MG/ML ~~LOC~~ SOSY
60.0000 mg | PREFILLED_SYRINGE | SUBCUTANEOUS | 0 refills | Status: DC
  Filled 2023-09-05: qty 1, 30d supply, fill #0

## 2023-09-05 MED ORDER — PROLIA 60 MG/ML ~~LOC~~ SOSY
PREFILLED_SYRINGE | SUBCUTANEOUS | 0 refills | Status: DC
  Filled 2023-09-06: qty 1, 180d supply, fill #0

## 2023-09-06 ENCOUNTER — Other Ambulatory Visit (HOSPITAL_COMMUNITY): Payer: Self-pay

## 2023-09-06 ENCOUNTER — Other Ambulatory Visit: Payer: Self-pay

## 2023-09-06 NOTE — Progress Notes (Signed)
 Pharmacy Patient Advocate Encounter  Insurance verification completed.   The patient is insured through [facility] ADVANTAGE/RX ADVANCE   Ran test claim for Prolia. Currently a quantity of 1 ml is a 180 day supply and the co-pay is $250.  This test claim was processed through [facility]- copay amounts may vary at other pharmacies due to pharmacy/plan contracts, or as the patient moves through the different stages of their insurance plan.

## 2023-09-06 NOTE — Progress Notes (Signed)
 Specialty Pharmacy Initial Fill Coordination Note  Jamie Bennett is a 63 y.o. female contacted today regarding initial fill of specialty medication(s) Denosumab  (Prolia )   Patient requested Delivery   Delivery date: 09/11/23   Verified address: [facility]- [address]   Medication will be filled on 2/10.   Patient is aware of $250 copayment.

## 2023-09-08 ENCOUNTER — Other Ambulatory Visit (HOSPITAL_COMMUNITY): Payer: Self-pay

## 2023-09-10 ENCOUNTER — Other Ambulatory Visit: Payer: Self-pay

## 2023-09-10 ENCOUNTER — Other Ambulatory Visit (HOSPITAL_COMMUNITY): Payer: Self-pay

## 2023-09-17 ENCOUNTER — Other Ambulatory Visit: Payer: Self-pay

## 2023-09-17 ENCOUNTER — Inpatient Hospital Stay: Payer: PPO | Attending: Oncology

## 2023-09-17 DIAGNOSIS — E538 Deficiency of other specified B group vitamins: Secondary | ICD-10-CM | POA: Insufficient documentation

## 2023-09-17 MED ORDER — CYANOCOBALAMIN 1000 MCG/ML IJ SOLN
1000.0000 ug | Freq: Once | INTRAMUSCULAR | Status: AC
Start: 2023-09-17 — End: 2023-09-17
  Administered 2023-09-17: 1000 ug via INTRAMUSCULAR

## 2023-09-18 ENCOUNTER — Other Ambulatory Visit (HOSPITAL_COMMUNITY): Payer: Self-pay

## 2023-09-18 DIAGNOSIS — E039 Hypothyroidism, unspecified: Secondary | ICD-10-CM | POA: Diagnosis not present

## 2023-09-18 DIAGNOSIS — Z6841 Body Mass Index (BMI) 40.0 and over, adult: Secondary | ICD-10-CM | POA: Diagnosis not present

## 2023-09-18 DIAGNOSIS — G4733 Obstructive sleep apnea (adult) (pediatric): Secondary | ICD-10-CM | POA: Diagnosis not present

## 2023-09-18 DIAGNOSIS — E559 Vitamin D deficiency, unspecified: Secondary | ICD-10-CM | POA: Diagnosis not present

## 2023-09-18 DIAGNOSIS — Z9884 Bariatric surgery status: Secondary | ICD-10-CM | POA: Diagnosis not present

## 2023-09-18 DIAGNOSIS — M810 Age-related osteoporosis without current pathological fracture: Secondary | ICD-10-CM | POA: Diagnosis not present

## 2023-09-18 MED ORDER — ZEPBOUND 2.5 MG/0.5ML ~~LOC~~ SOAJ
2.5000 mg | SUBCUTANEOUS | 0 refills | Status: DC
  Filled 2023-09-18: qty 2, 28d supply, fill #0

## 2023-09-18 MED ORDER — ZEPBOUND 5 MG/0.5ML ~~LOC~~ SOAJ
5.0000 mg | SUBCUTANEOUS | 6 refills | Status: DC
Start: 2023-09-18 — End: 2023-12-04
  Filled 2023-09-18: qty 2, 28d supply, fill #0

## 2023-09-24 ENCOUNTER — Other Ambulatory Visit: Payer: Self-pay

## 2023-09-24 ENCOUNTER — Other Ambulatory Visit (HOSPITAL_COMMUNITY): Payer: Self-pay

## 2023-09-25 DIAGNOSIS — D84821 Immunodeficiency due to drugs: Secondary | ICD-10-CM | POA: Diagnosis not present

## 2023-09-25 DIAGNOSIS — F419 Anxiety disorder, unspecified: Secondary | ICD-10-CM | POA: Diagnosis not present

## 2023-09-25 DIAGNOSIS — E559 Vitamin D deficiency, unspecified: Secondary | ICD-10-CM | POA: Diagnosis not present

## 2023-09-25 DIAGNOSIS — Z6841 Body Mass Index (BMI) 40.0 and over, adult: Secondary | ICD-10-CM | POA: Diagnosis not present

## 2023-09-25 DIAGNOSIS — D8481 Immunodeficiency due to conditions classified elsewhere: Secondary | ICD-10-CM | POA: Diagnosis not present

## 2023-09-25 DIAGNOSIS — M069 Rheumatoid arthritis, unspecified: Secondary | ICD-10-CM | POA: Diagnosis not present

## 2023-09-25 DIAGNOSIS — G4700 Insomnia, unspecified: Secondary | ICD-10-CM | POA: Diagnosis not present

## 2023-09-25 DIAGNOSIS — F3342 Major depressive disorder, recurrent, in full remission: Secondary | ICD-10-CM | POA: Diagnosis not present

## 2023-09-25 DIAGNOSIS — F1320 Sedative, hypnotic or anxiolytic dependence, uncomplicated: Secondary | ICD-10-CM | POA: Diagnosis not present

## 2023-09-25 DIAGNOSIS — E039 Hypothyroidism, unspecified: Secondary | ICD-10-CM | POA: Diagnosis not present

## 2023-09-25 DIAGNOSIS — E538 Deficiency of other specified B group vitamins: Secondary | ICD-10-CM | POA: Diagnosis not present

## 2023-10-01 ENCOUNTER — Other Ambulatory Visit: Payer: Self-pay

## 2023-10-05 ENCOUNTER — Encounter: Payer: Self-pay | Admitting: Hematology and Oncology

## 2023-10-11 ENCOUNTER — Encounter: Payer: Self-pay | Admitting: Hematology and Oncology

## 2023-10-11 ENCOUNTER — Ambulatory Visit (INDEPENDENT_AMBULATORY_CARE_PROVIDER_SITE_OTHER): Payer: Self-pay | Admitting: Physician Assistant

## 2023-10-11 ENCOUNTER — Encounter: Payer: Self-pay | Admitting: Physician Assistant

## 2023-10-11 VITALS — BP 130/80 | HR 74 | Temp 96.6°F | Resp 16 | Ht 65.0 in | Wt 352.0 lb

## 2023-10-11 DIAGNOSIS — Z6841 Body Mass Index (BMI) 40.0 and over, adult: Secondary | ICD-10-CM

## 2023-10-11 DIAGNOSIS — G4710 Hypersomnia, unspecified: Secondary | ICD-10-CM | POA: Diagnosis not present

## 2023-10-11 NOTE — Progress Notes (Signed)
 [facility] [address] [place], [place] [address]  Internal MEDICINE  Office Visit Note  Patient Name: Jamie Bennett  [id number]  [id number]  Date of Service: 10/11/2023  Chief Complaint  Patient presents with   Follow-up    Requesting  Sleep study     HPI Pt is here for routine follow up to discuss possible sleep study  -states her endocrinologist has recommended she pursue testing for OSA -Has been working to get approval for a GLP1 for wt loss and this prompted discussion of sleep apnea given zepbound can be used in pt with OSA potentially -Pt does report long hx of snoring at night, will wake herself up sometimes. Sometimes wakes gasping and does have some daytime sleepiness/fatigue.  -Waking with morning headaches. -can't lay flat on back, feels like more gasping when this occurs  EPWORTH SLEEPINESS SCALE:  Scale:  (0)= no chance of dozing; (1)= slight chance of dozing; (2)= moderate chance of dozing; (3)= high chance of dozing  Chance  Situtation    Sitting and reading: 2    Watching TV: 1    Sitting Inactive in public: 0    As a passenger in car: 1      Lying down to rest: 3    Sitting and talking: 0    Sitting quielty after lunch: 2    In a car, stopped in traffic: 0   TOTAL SCORE:   9 out of 24    Current Medication: Outpatient Encounter Medications as of 10/11/2023  Medication Sig   acetaminophen (TYLENOL) 500 MG tablet Take 500-1,000 mg by mouth every 6 (six) hours as needed (pain.).   Budeson-Glycopyrrol-Formoterol (BREZTRI AEROSPHERE) 160-9-4.8 MCG/ACT AERO Inhale 2 puffs into the lungs 2 (two) times daily.   busPIRone (BUSPAR) 15 MG tablet Take 2 tablets (30 mg total) by mouth 2 (two) times daily.   Calcium Carbonate (CALCIUM 600 PO) Take 600 mg by mouth in the morning and at bedtime.   chlorpheniramine-HYDROcodone (TUSSIONEX) 10-8 MG/5ML Take 5 mLs by mouth every 12 (twelve) hours as needed for cough.   Cholecalciferol 125  MCG (5000 UT) TABS Take 5,000 Units by mouth in the morning.   clonazePAM (KLONOPIN) 0.5 MG tablet TAKE 1 TABLET BY MOUTH ONCE DAILY AS NEEDED FOR  ACUTE  ANXIETY. Pt may take 1 extra tablet (0.5 mg) PRN as needed for severe anxiety.   clonazePAM (KLONOPIN) 0.5 MG tablet Take 1 tablet (0.5 mg total) by mouth daily as needed for acute anxiety. may take 1 extra tab as needed for severe anxiety.   Coenzyme Q10 300 MG CAPS Take 300 mg by mouth in the morning.   cyanocobalamin (,VITAMIN B-12,) 1000 MCG/ML injection Inject 1,000 mcg into the muscle every 30 (thirty) days.    denosumab (PROLIA) 60 MG/ML SOSY injection Inject 60 mg into the skin every 6 (six) months.   denosumab (PROLIA) 60 MG/ML SOSY injection Inject 1 mL (60 mg total) subcutaneously as directed   desipramine (NORPRAMIN) 25 MG tablet Take 1 tablet (25 mg total) by mouth daily.   hydroxychloroquine (PLAQUENIL) 200 MG tablet TAKE 1 TABLET TWICE A DAY   hydrOXYzine (ATARAX) 50 MG tablet Take 1 tablet (50 mg total) by mouth 3 (three) times daily as needed for anxiety.   hydrOXYzine (ATARAX) 50 MG tablet Take 1 tablet (50 mg total) by mouth 3 (three) times daily as needed for anxiety.   ipratropium-albuterol (DUONEB) 0.5-2.5 (3) MG/3ML SOLN Take 3 mLs by nebulization every  4 (four) hours as needed.   Krill Oil 500 MG CAPS Take 500 mg by mouth daily at 12 noon.   lamoTRIgine (LAMICTAL) 150 MG tablet Take 1 tablet (150 mg total) by mouth 2 (two) times daily.   lamoTRIgine (LAMICTAL) 150 MG tablet Take 1 tablet (150 mg total) by mouth 2 (two) times daily.   levothyroxine (SYNTHROID) 112 MCG tablet TAKE 1 TABLET BY MOUTH ONCE DAILY IN THE MORNING BEFORE BREAKFAST   loratadine (CLARITIN) 10 MG tablet Take 10 mg by mouth daily as needed for allergies.   magnesium oxide (MAG-OX) 400 MG tablet Take 400 mg by mouth every evening.   metoCLOPramide (REGLAN) 5 MG tablet Take 1 tablet (5 mg total) by mouth at bedtime.   Multiple Vitamin (MULTIVITAMIN)  tablet Take 1 tablet by mouth every evening.   mupirocin ointment (BACTROBAN) 2 % Apply 1 Application topically 2 (two) times daily.   Nebulizers (COMPRESSOR/NEBULIZER) MISC Dispense 1 machine with associated mask and tubing   nitrofurantoin, macrocrystal-monohydrate, (MACROBID) 100 MG capsule Take 1 capsule (100 mg total) by mouth 2 (two) times daily.   nystatin (MYCOSTATIN/NYSTOP) powder APPLY 1 APPLICATION        TOPICALLY TWO TIMES A DAY   omeprazole (PRILOSEC) 40 MG capsule Take 1 capsule (40 mg total) by mouth daily.   polyethylene glycol (MIRALAX / GLYCOLAX) 17 g packet Take 17 g by mouth in the morning and at bedtime.   psyllium (METAMUCIL) 58.6 % packet Take 1 packet by mouth in the morning.   sucralfate (CARAFATE) 1 g tablet Take 1 tablet (1 g total) by mouth 2 (two) times daily.   tirzepatide (ZEPBOUND) 2.5 MG/0.5ML Pen Inject 2.5 mg into the skin once a week.   tirzepatide (ZEPBOUND) 5 MG/0.5ML Pen Inject 5 mg into the skin once a week.   tiZANidine (ZANAFLEX) 4 MG tablet TAKE 1 TABLET 3 TIMES A DAY   traZODone (DESYREL) 100 MG tablet Take 1 tablet (100 mg total) by mouth at bedtime as needed for sleep and may repeat dose one time if needed.   venlafaxine (EFFEXOR) 100 MG tablet Take 1 tablet (100 mg total) by mouth 3 (three) times daily with meals.   venlafaxine (EFFEXOR) 100 MG tablet Take 1 tablet (100 mg total) by mouth 3 (three) times daily with meals.   vitamin C (ASCORBIC ACID) 500 MG tablet Take 500 mg by mouth in the morning.   zinc gluconate 50 MG tablet Take 50 mg by mouth in the morning.   albuterol (VENTOLIN HFA) 108 (90 Base) MCG/ACT inhaler Inhale 2 puffs into the lungs every 6 (six) hours as needed for wheezing or shortness of breath. (Patient not taking: Reported on 10/11/2023)   benzonatate (TESSALON) 100 MG capsule Take 1-2 tablets 3 times a day as needed for cough (Patient not taking: Reported on 10/11/2023)   No facility-administered encounter medications on file as  of 10/11/2023.    Surgical History: Past Surgical History:  Procedure Laterality Date   CHOLECYSTECTOMY  2004   COLONOSCOPY N/A 07/16/2017   Procedure: COLONOSCOPY;  Surgeon: Scot Jun, MD;  Location: [facility] ENDOSCOPY;  Service: Endoscopy;  Laterality: N/A;   EXPLORATORY LAPAROTOMY  2009   GASTRIC BYPASS  2003   [facility]   HEEL SPUR EXCISION Bilateral    KNEE ARTHROSCOPY WITH SUBCHONDROPLASTY Left 09/27/2021   Procedure: Left knee subchondroplasty medial tibial plateau;  Surgeon: Kennedy Bucker, MD;  Location: [facility] ORS;  Service: Orthopedics;  Laterality: Left;   LAPAROTOMY N/A  03/01/2018   Procedure: EXPLORATORY LAPAROTOMY/ UMBILECTOMY;  Surgeon: Earline Mayotte, MD;  Location: [facility] ORS;  Service: General;  Laterality: N/A;   TONSILLECTOMY     VENTRAL HERNIA REPAIR N/A 03/07/2018   Procedure: HERNIA REPAIR VENTRAL ADULT;  Surgeon: Earline Mayotte, MD;  Location: [facility] ORS;  Service: General;  Laterality: N/A;    Medical History: Past Medical History:  Diagnosis Date   Anemia    Anxiety    Bipolar 1 disorder (HCC)    Collagen vascular disease (HCC)    rhematoid arthritis   Constipation    Dyspnea    Fibromyalgia    GERD (gastroesophageal reflux disease)    Heart murmur    mild-asymptomatic   Hepatic steatosis    History of kidney stones    History of Roux-en-Y gastric bypass    Hypotension    Hypothyroidism    Morbid obesity with BMI of 50.0-59.9, adult (HCC)    Opioid abuse (HCC)    Osteoarthritis    Osteoporosis    PONV (postoperative nausea and vomiting)    nausea only   Rheumatic fever    Rheumatoid arthritis (HCC)    Small bowel obstruction (HCC)    Thyroid disease     Family History: Family History  Problem Relation Age of Onset   Depression Mother    Dementia Mother    Heart disease Mother    Osteoporosis Mother    Parkinson's disease Father    Liver disease Brother    Colon cancer Neg Hx     Social History   Socioeconomic  History   Marital status: Married    Spouse name: Not on file   Number of children: Not on file   Years of education: Not on file   Highest education level: Not on file  Occupational History   Not on file  Tobacco Use   Smoking status: Never    Passive exposure: Past   Smokeless tobacco: Never  Vaping Use   Vaping status: Never Used  Substance and Sexual Activity   Alcohol use: Not Currently   Drug use: No   Sexual activity: Yes  Other Topics Concern   Not on file  Social History Narrative   Not on file   Social Drivers of Health   Financial Resource Strain: Low Risk  (12/13/2022)   Received from [facility], [facility]   Overall Financial Resource Strain (CARDIA)    Difficulty of Paying Living Expenses: Not hard at all  Food Insecurity: No Food Insecurity (12/13/2022)   Received from [facility], [facility]   Hunger Vital Sign    Worried About Running Out of Food in the Last Year: Never true    Ran Out of Food in the Last Year: Never true  Transportation Needs: No Transportation Needs (12/13/2022)   Received from [facility], [facility]   [facility] - Transportation    In the past 12 months, has lack of transportation kept you from medical appointments or from getting medications?: No    Lack of Transportation (Non-Medical): No  Physical Activity: Insufficiently Active (12/13/2022)   Received from [facility], [facility]   Exercise Vital Sign    Days of Exercise per Week: 1 day    Minutes of Exercise per Session: 10 min  Stress: Stress Concern Present (12/13/2022)   Received from [facility], [name]  [employer] [facility] - Occupational Stress Questionnaire    Feeling of Stress : To some extent  Social Connections: Socially Isolated (12/13/2022)   Received from [facility], [facility]   Social Connection and Isolation Panel [NHANES]    Frequency of Communication with Friends and Family: Never    Frequency of Social Gatherings with Friends and Family: Never    Attends Religious Services: Never    Database administrator or Organizations: No    Attends Banker Meetings: Never    Marital Status: Married  Catering manager Violence: Unknown (05/11/2022)   Received from [facility] [employer]   Abuse Screen    Unsafe at Home or Work/School: Not on file    Feels Threatened by Someone?: Not on file    Does Anyone Keep You from Contacting Others or Doint Things Outside the Home?: Not on file    Physical Sign of Abuse Present: Not on file      Review of Systems  Constitutional:  Negative for chills, fatigue and unexpected weight change.  HENT:  Positive for postnasal drip. Negative for congestion, rhinorrhea, sneezing and sore throat.   Eyes:  Negative for redness.  Respiratory:  Negative for chest tightness and shortness of breath.   Cardiovascular:  Negative for chest pain and palpitations.  Gastrointestinal:  Negative for abdominal pain, constipation, diarrhea, nausea and vomiting.  Genitourinary:  Negative for dysuria and frequency.  Musculoskeletal:  Positive for arthralgias. Negative for back pain, joint swelling and neck pain.  Skin:  Negative for rash.  Neurological: Negative.  Negative for tremors and numbness.  Hematological:  Negative for adenopathy. Does not bruise/bleed easily.  Psychiatric/Behavioral:  Positive for sleep disturbance. Negative for behavioral problems (Depression) and suicidal ideas.     Vital Signs: BP 130/80   Pulse 74   Temp (!) 96.6 F (35.9 C)   Resp 16   Ht 5\' 5"  (1.651 m)   Wt (!) 352 lb (159.7 kg)   SpO2 95%   BMI 58.58 kg/m    Physical Exam Vitals and nursing note reviewed.  Constitutional:      Appearance: Normal  appearance. She is obese.  HENT:     Head: Normocephalic and atraumatic.  Eyes:     Pupils: Pupils are equal, round, and reactive to light.  Cardiovascular:     Rate and Rhythm: Normal rate and regular rhythm.  Pulmonary:     Effort: Pulmonary effort is normal.     Breath sounds: Normal breath sounds.  Skin:    General: Skin is warm and dry.  Neurological:     Mental Status: She is alert.  Psychiatric:        Mood and Affect: Mood normal.        Behavior: Behavior normal.        Assessment/Plan: 1. Hypersomnia (Primary) Due to snoring, gasping, daytime fatigue and elevated BMI, will order PSG for further evaluation - PSG SLEEP STUDY; Future  2. Morbid obesity with BMI of 50.0-59.9, adult (HCC) Will order PSG - PSG SLEEP STUDY; Future   General Counseling: allean montfort understanding of the findings of todays visit and agrees with plan of treatment. I have discussed any further diagnostic evaluation that may be needed or ordered today. We also reviewed her medications today. she has been encouraged to call the office with any questions or concerns that should arise related to todays visit.    No orders of  the defined types were placed in this encounter.   No orders of the defined types were placed in this encounter.   This patient was seen by Lynn Ito, PA-C in collaboration with Dr. Beverely Risen as a part of collaborative care agreement.   Total time spent:30 Minutes Time spent includes review of chart, medications, test results, and follow up plan with the patient.      Dr Lyndon Code Internal medicine

## 2023-10-15 ENCOUNTER — Inpatient Hospital Stay: Payer: PPO

## 2023-10-16 ENCOUNTER — Inpatient Hospital Stay: Payer: PPO | Attending: Oncology

## 2023-10-16 ENCOUNTER — Telehealth: Payer: Self-pay | Admitting: Physician Assistant

## 2023-10-16 DIAGNOSIS — E538 Deficiency of other specified B group vitamins: Secondary | ICD-10-CM | POA: Insufficient documentation

## 2023-10-16 MED ORDER — CYANOCOBALAMIN 1000 MCG/ML IJ SOLN
1000.0000 ug | Freq: Once | INTRAMUSCULAR | Status: AC
Start: 2023-10-16 — End: 2023-10-16
  Administered 2023-10-16: 1000 ug via INTRAMUSCULAR
  Filled 2023-10-16: qty 1

## 2023-10-16 NOTE — Telephone Encounter (Signed)
 SS order emailed to [facility] w/  [facility]

## 2023-10-19 ENCOUNTER — Other Ambulatory Visit: Payer: Self-pay

## 2023-10-19 ENCOUNTER — Other Ambulatory Visit (HOSPITAL_COMMUNITY): Payer: Self-pay

## 2023-10-22 ENCOUNTER — Other Ambulatory Visit (HOSPITAL_COMMUNITY): Payer: Self-pay

## 2023-10-22 ENCOUNTER — Other Ambulatory Visit: Payer: Self-pay

## 2023-10-22 ENCOUNTER — Other Ambulatory Visit (HOSPITAL_BASED_OUTPATIENT_CLINIC_OR_DEPARTMENT_OTHER): Payer: Self-pay

## 2023-10-22 DIAGNOSIS — E039 Hypothyroidism, unspecified: Secondary | ICD-10-CM

## 2023-10-22 MED ORDER — LEVOTHYROXINE SODIUM 112 MCG PO TABS
112.0000 ug | ORAL_TABLET | Freq: Every day | ORAL | 0 refills | Status: DC
  Filled 2023-10-22 (×2): qty 90, 90d supply, fill #0

## 2023-10-25 DIAGNOSIS — H25813 Combined forms of age-related cataract, bilateral: Secondary | ICD-10-CM | POA: Diagnosis not present

## 2023-10-25 DIAGNOSIS — H35343 Macular cyst, hole, or pseudohole, bilateral: Secondary | ICD-10-CM | POA: Diagnosis not present

## 2023-10-27 ENCOUNTER — Other Ambulatory Visit (HOSPITAL_COMMUNITY): Payer: Self-pay

## 2023-10-29 ENCOUNTER — Other Ambulatory Visit: Payer: Self-pay

## 2023-10-29 ENCOUNTER — Other Ambulatory Visit (HOSPITAL_COMMUNITY): Payer: Self-pay

## 2023-10-29 ENCOUNTER — Other Ambulatory Visit: Payer: Self-pay | Admitting: *Deleted

## 2023-10-29 DIAGNOSIS — M0579 Rheumatoid arthritis with rheumatoid factor of multiple sites without organ or systems involvement: Secondary | ICD-10-CM

## 2023-10-29 MED ORDER — HYDROXYCHLOROQUINE SULFATE 200 MG PO TABS
200.0000 mg | ORAL_TABLET | Freq: Two times a day (BID) | ORAL | 0 refills | Status: DC
Start: 2023-10-29 — End: 2024-02-11

## 2023-10-29 NOTE — Telephone Encounter (Signed)
 Patient contacted the office to request a medication refill.   1. Name of Medication: Plaquenil  2. How are you currently taking this medication (dosage and times per day)? 200 mg po BID   3. What pharmacy would you like for that to be sent to? [facility]- [address]

## 2023-10-29 NOTE — Telephone Encounter (Signed)
 Last Fill: 07/16/2023   Eye exam: 06/06/2023    Labs: 07/03/2023  CMP stable. CBC WNL    Next Visit: 08/22/2023   Last Visit: 07/03/2023   [id number] arthritis involving multiple sites with positive rheumatoid factor    Current Dose per office note 07/03/2023: Plaquenil 200 mg 1 tablet by mouth twice daily.    Okay to refill Plaquenil?

## 2023-10-30 HISTORY — PX: CATARACT EXTRACTION, BILATERAL: SHX1313

## 2023-11-07 DIAGNOSIS — H25812 Combined forms of age-related cataract, left eye: Secondary | ICD-10-CM | POA: Diagnosis not present

## 2023-11-07 DIAGNOSIS — E039 Hypothyroidism, unspecified: Secondary | ICD-10-CM | POA: Diagnosis not present

## 2023-11-07 DIAGNOSIS — F418 Other specified anxiety disorders: Secondary | ICD-10-CM | POA: Diagnosis not present

## 2023-11-14 ENCOUNTER — Inpatient Hospital Stay: Attending: Oncology

## 2023-11-14 DIAGNOSIS — E538 Deficiency of other specified B group vitamins: Secondary | ICD-10-CM | POA: Diagnosis not present

## 2023-11-14 MED ORDER — CYANOCOBALAMIN 1000 MCG/ML IJ SOLN
1000.0000 ug | Freq: Once | INTRAMUSCULAR | Status: AC
  Administered 2023-11-14: 1000 ug via INTRAMUSCULAR
  Filled 2023-11-14: qty 1

## 2023-11-15 ENCOUNTER — Inpatient Hospital Stay: Payer: PPO

## 2023-11-20 ENCOUNTER — Encounter: Payer: Self-pay | Admitting: Hematology and Oncology

## 2023-11-20 ENCOUNTER — Other Ambulatory Visit: Payer: Self-pay

## 2023-11-20 ENCOUNTER — Ambulatory Visit (HOSPITAL_BASED_OUTPATIENT_CLINIC_OR_DEPARTMENT_OTHER): Payer: Medicare HMO | Admitting: Psychiatry

## 2023-11-20 ENCOUNTER — Other Ambulatory Visit (HOSPITAL_BASED_OUTPATIENT_CLINIC_OR_DEPARTMENT_OTHER): Payer: Self-pay

## 2023-11-20 VITALS — BP 143/84 | HR 72 | Ht 66.0 in | Wt 352.0 lb

## 2023-11-20 DIAGNOSIS — F320 Major depressive disorder, single episode, mild: Secondary | ICD-10-CM

## 2023-11-20 DIAGNOSIS — F411 Generalized anxiety disorder: Secondary | ICD-10-CM | POA: Diagnosis not present

## 2023-11-20 DIAGNOSIS — F603 Borderline personality disorder: Secondary | ICD-10-CM | POA: Diagnosis not present

## 2023-11-20 DIAGNOSIS — F332 Major depressive disorder, recurrent severe without psychotic features: Secondary | ICD-10-CM

## 2023-11-20 MED ORDER — VENLAFAXINE HCL 100 MG PO TABS
100.0000 mg | ORAL_TABLET | Freq: Three times a day (TID) | ORAL | 5 refills | Status: DC
  Filled 2023-11-20: qty 90, 30d supply, fill #0

## 2023-11-20 MED ORDER — CLONAZEPAM 0.5 MG PO TABS: ORAL_TABLET | ORAL | 5 refills | Status: DC

## 2023-11-20 MED ORDER — BUSPIRONE HCL 15 MG PO TABS
30.0000 mg | ORAL_TABLET | Freq: Two times a day (BID) | ORAL | 5 refills | Status: DC
  Filled 2023-11-20: qty 120, 30d supply, fill #0

## 2023-11-20 MED ORDER — LAMOTRIGINE 150 MG PO TABS
150.0000 mg | ORAL_TABLET | Freq: Every day | ORAL | 4 refills | Status: DC
  Filled 2023-11-20: qty 60, 60d supply, fill #0

## 2023-11-20 MED ORDER — HYDROXYZINE HCL 50 MG PO TABS
50.0000 mg | ORAL_TABLET | Freq: Three times a day (TID) | ORAL | 1 refills | Status: DC | PRN
Start: 2023-11-20 — End: 2024-05-21

## 2023-11-20 NOTE — Progress Notes (Signed)
 `  Psychiatric Initial Adult Assessment   Patient Identification: Jamie Bennett MRN:  [id number] Date of Evaluation:  11/20/2023 Referral Source: grams per previous psychiatrist Chief Complaint:      Today the patient is doing fairly well.  She is at her baseline.  Her mood is good.  He still has a lot of financial issues with selling the house in [place] .  Generally she is sleeping and eating well.  She has no vegetative symptoms.  Today we decided to reduce her Lamictal  from 300 mg down to 150 mg and on her next visit we will probably discontinue.  She takes multiple psychotropic medications.  Today again was abbreviated session.  Her husband chose not to come visit to it.  Generally she shows no vegetative symptoms.  She denies the use of alcohol or drugs.  Visit Diagnosis: borderline personality disorder   ICD-10-CM   1. Anxiety state  F41.1 venlafaxine  (EFFEXOR ) 100 MG tablet    busPIRone  (BUSPAR ) 15 MG tablet    clonazePAM  (KLONOPIN ) 0.5 MG tablet    hydrOXYzine  (ATARAX ) 50 MG tablet    2. Borderline personality disorder (HCC)  F60.3 venlafaxine  (EFFEXOR ) 100 MG tablet    lamoTRIgine  (LAMICTAL ) 150 MG tablet    3. Major depressive disorder, recurrent, severe without psychotic features (HCC)  F33.2 venlafaxine  (EFFEXOR ) 100 MG tablet    lamoTRIgine  (LAMICTAL ) 150 MG tablet       History of Pre Depression Symptoms:  fatigue, (Hypo) Manic Symptoms:   Anxiety Symptoms:   Psychotic Symptoms:   PTSD Symptoms:   Past Psychiatric History: 10 psychiatric hospitalizations multiple psychotropic medications presently in psychotherapy  Previous Psychotropic Medications: Yes   Substance Abuse History in the last 12 months:  Yes.    Consequences of Substance Abuse:   Past Medical History:  Past Medical History:  Diagnosis Date   Anemia    Anxiety    Bipolar 1 disorder (HCC)    Collagen vascular disease (HCC)    rhematoid arthritis   Constipation    Dyspnea     Fibromyalgia    GERD (gastroesophageal reflux disease)    Heart murmur    mild-asymptomatic   Hepatic steatosis    History of kidney stones    History of Roux-en-Y gastric bypass    Hypotension    Hypothyroidism    Morbid obesity with BMI of 50.0-59.9, adult (HCC)    Opioid abuse (HCC)    Osteoarthritis    Osteoporosis    PONV (postoperative nausea and vomiting)    nausea only   Rheumatic fever    Rheumatoid arthritis (HCC)    Small bowel obstruction (HCC)    Thyroid  disease     Past Surgical History:  Procedure Laterality Date   CHOLECYSTECTOMY  2004   COLONOSCOPY N/A 07/16/2017   Procedure: COLONOSCOPY;  Surgeon: Cassie Click, MD;  Location: [facility] ENDOSCOPY;  Service: Endoscopy;  Laterality: N/A;   EXPLORATORY LAPAROTOMY  2009   GASTRIC BYPASS  2003   [facility]   HEEL SPUR EXCISION Bilateral    KNEE ARTHROSCOPY WITH SUBCHONDROPLASTY Left 09/27/2021   Procedure: Left knee subchondroplasty medial tibial plateau;  Surgeon: Molli Angelucci, MD;  Location: [facility] ORS;  Service: Orthopedics;  Laterality: Left;   LAPAROTOMY N/A 03/01/2018   Procedure: EXPLORATORY LAPAROTOMY/ UMBILECTOMY;  Surgeon: Marshall Skeeter, MD;  Location: [facility] ORS;  Service: General;  Laterality: N/A;   TONSILLECTOMY     VENTRAL HERNIA REPAIR N/A 03/07/2018   Procedure: HERNIA REPAIR VENTRAL ADULT;  Surgeon: Marshall Skeeter, MD;  Location: [facility] ORS;  Service: General;  Laterality: N/A;    Family Psychiatric History:   Family History:  Family History  Problem Relation Age of Onset   Depression Mother    Dementia Mother    Heart disease Mother    Osteoporosis Mother    Parkinson's disease Father    Liver disease Brother    Colon cancer Neg Hx     Social History:   Social History   Socioeconomic History   Marital status: Married    Spouse name: Not on file   Number of children: Not on file   Years of education: Not on file   Highest education level: Not on file  Occupational  History   Not on file  Tobacco Use   Smoking status: Never    Passive exposure: Past   Smokeless tobacco: Never  Vaping Use   Vaping status: Never Used  Substance and Sexual Activity   Alcohol use: Not Currently   Drug use: No   Sexual activity: Yes  Other Topics Concern   Not on file  Social History Narrative   Not on file   Social Drivers of Health   Financial Resource Strain: Low Risk  (12/13/2022)   Received from [facility], [facility]   Overall Financial Resource Strain (CARDIA)    Difficulty of Paying Living Expenses: Not hard at all  Food Insecurity: No Food Insecurity (12/13/2022)   Received from [facility], [facility]   Hunger Vital Sign    Worried About Running Out of Food in the Last Year: Never true    Ran Out of Food in the Last Year: Never true  Transportation Needs: No Transportation Needs (12/13/2022)   Received from [facility], [facility]   [facility] - Transportation    In the past 12 months, has lack of transportation kept you from medical appointments or from getting medications?: No    Lack of Transportation (Non-Medical): No  Physical Activity: Insufficiently Active (12/13/2022)   Received from [facility], [facility]   Exercise Vital Sign    Days of Exercise per Week: 1 day    Minutes of Exercise per Session: 10 min  Stress: Stress Concern Present (12/13/2022)   Received from [facility], [facility]   [employer] [facility] - Occupational Stress Questionnaire    Feeling of Stress : To some extent  Social Connections: Socially Isolated (12/13/2022)   Received from [facility], [facility]   Social Connection and Isolation Panel [NHANES]    Frequency of Communication with Friends and Family: Never    Frequency of Social  Gatherings with Friends and Family: Never    Attends Religious Services: Never    Database administrator or Organizations: No    Attends Banker Meetings: Never    Marital Status: Married    Additional Social History:   Allergies:   Allergies  Allergen Reactions   Lactose Intolerance (Gi) Other (See Comments)    Bloating and GI distress   Sulfa  Antibiotics Hives    Metabolic Disorder Labs: Lab Results  Component Value Date   HGBA1C 5.0 09/01/2021   No results found for: "PROLACTIN" Lab Results  Component Value Date   CHOL 169 05/26/2020   TRIG 136 05/26/2020   HDL 73 05/26/2020   LDLCALC 73  05/26/2020     Current Medications: Current Outpatient Medications  Medication Sig Dispense Refill   acetaminophen  (TYLENOL ) 500 MG tablet Take 500-1,000 mg by mouth every 6 (six) hours as needed (pain.).     albuterol  (VENTOLIN  HFA) 108 (90 Base) MCG/ACT inhaler Inhale 2 puffs into the lungs every 6 (six) hours as needed for wheezing or shortness of breath. (Patient not taking: Reported on 10/11/2023) 8 g 3   benzonatate  (TESSALON ) 100 MG capsule Take 1-2 tablets 3 times a day as needed for cough (Patient not taking: Reported on 10/11/2023) 30 capsule 0   Budeson-Glycopyrrol-Formoterol (BREZTRI  AEROSPHERE) 160-9-4.8 MCG/ACT AERO Inhale 2 puffs into the lungs 2 (two) times daily. 10.7 g 11   busPIRone  (BUSPAR ) 15 MG tablet Take 2 tablets (30 mg total) by mouth 2 (two) times daily. 120 tablet 5   Calcium Carbonate (CALCIUM 600 PO) Take 600 mg by mouth in the morning and at bedtime.     chlorpheniramine-HYDROcodone  (TUSSIONEX) 10-8 MG/5ML Take 5 mLs by mouth every 12 (twelve) hours as needed for cough. 140 mL 0   Cholecalciferol  125 MCG (5000 UT) TABS Take 5,000 Units by mouth in the morning.     clonazePAM  (KLONOPIN ) 0.5 MG tablet Take 1 tablet (0.5 mg total) by mouth daily as needed for acute anxiety. may take 1 extra tab as needed for severe anxiety. 35 tablet 5   clonazePAM   (KLONOPIN ) 0.5 MG tablet 1 bid 60 tablet 5   Coenzyme Q10 300 MG CAPS Take 300 mg by mouth in the morning.     cyanocobalamin  (,VITAMIN B-12,) 1000 MCG/ML injection Inject 1,000 mcg into the muscle every 30 (thirty) days.      denosumab  (PROLIA ) 60 MG/ML SOSY injection Inject 60 mg into the skin every 6 (six) months.     denosumab  (PROLIA ) 60 MG/ML SOSY injection Inject 1 mL (60 mg total) subcutaneously as directed 1 mL 0   desipramine  (NORPRAMIN ) 25 MG tablet Take 1 tablet (25 mg total) by mouth daily. 100 tablet 0   hydroxychloroquine  (PLAQUENIL ) 200 MG tablet Take 1 tablet (200 mg total) by mouth 2 (two) times daily. 180 tablet 0   hydrOXYzine  (ATARAX ) 50 MG tablet Take 1 tablet (50 mg total) by mouth 3 (three) times daily as needed for anxiety. 270 tablet 2   hydrOXYzine  (ATARAX ) 50 MG tablet Take 1 tablet (50 mg total) by mouth 3 (three) times daily as needed for anxiety. 270 tablet 1   ipratropium-albuterol  (DUONEB) 0.5-2.5 (3) MG/3ML SOLN Take 3 mLs by nebulization every 4 (four) hours as needed. 360 mL 1   Krill Oil 500 MG CAPS Take 500 mg by mouth daily at 12 noon.     lamoTRIgine  (LAMICTAL ) 150 MG tablet Take 1 tablet (150 mg total) by mouth 2 (two) times daily. 180 tablet 1   lamoTRIgine  (LAMICTAL ) 150 MG tablet Take 1 tablet (150 mg total) by mouth daily. 60 tablet 4   levothyroxine  (SYNTHROID ) 112 MCG tablet Take 1 tablet (112 mcg total) by mouth daily before breakfast. 90 tablet 0   loratadine  (CLARITIN ) 10 MG tablet Take 10 mg by mouth daily as needed for allergies.     magnesium  oxide (MAG-OX) 400 MG tablet Take 400 mg by mouth every evening.     metoCLOPramide  (REGLAN ) 5 MG tablet Take 1 tablet (5 mg total) by mouth at bedtime. 90 tablet 5   Multiple Vitamin (MULTIVITAMIN) tablet Take 1 tablet by mouth every evening.     mupirocin  ointment (BACTROBAN ) 2 %  Apply 1 Application topically 2 (two) times daily. 30 g 3   Nebulizers (COMPRESSOR/NEBULIZER) MISC Dispense 1 machine with  associated mask and tubing 1 each 0   nitrofurantoin , macrocrystal-monohydrate, (MACROBID ) 100 MG capsule Take 1 capsule (100 mg total) by mouth 2 (two) times daily. 14 capsule 0   nystatin  (MYCOSTATIN /NYSTOP ) powder APPLY 1 APPLICATION        TOPICALLY TWO TIMES A DAY 30 g 6   omeprazole  (PRILOSEC) 40 MG capsule Take 1 capsule (40 mg total) by mouth daily. 30 capsule 3   polyethylene glycol (MIRALAX  / GLYCOLAX ) 17 g packet Take 17 g by mouth in the morning and at bedtime.     psyllium (METAMUCIL) 58.6 % packet Take 1 packet by mouth in the morning.     sucralfate  (CARAFATE ) 1 g tablet Take 1 tablet (1 g total) by mouth 2 (two) times daily. 180 tablet 1   tirzepatide  (ZEPBOUND ) 2.5 MG/0.5ML Pen Inject 2.5 mg into the skin once a week. 2 mL 0   tirzepatide  (ZEPBOUND ) 5 MG/0.5ML Pen Inject 5 mg into the skin once a week. 2 mL 6   tiZANidine  (ZANAFLEX ) 4 MG tablet TAKE 1 TABLET 3 TIMES A DAY 270 tablet 0   traZODone  (DESYREL ) 100 MG tablet Take 1 tablet (100 mg total) by mouth at bedtime as needed for sleep and may repeat dose one time if needed. 60 tablet 5   venlafaxine  (EFFEXOR ) 100 MG tablet Take 1 tablet (100 mg total) by mouth 3 (three) times daily with meals. 270 tablet 2   venlafaxine  (EFFEXOR ) 100 MG tablet Take 1 tablet (100 mg total) by mouth 3 (three) times daily with meals. 90 tablet 5   vitamin C (ASCORBIC ACID) 500 MG tablet Take 500 mg by mouth in the morning.     zinc  gluconate 50 MG tablet Take 50 mg by mouth in the morning.     No current facility-administered medications for this visit.    Neurologic: Headache: No Seizure: No Paresthesias:NA  Musculoskeletal: Strength & Muscle Tone: within normal limits Gait & Station: normal Patient leans: N/A  Psychiatric Specialty Exam: ROS  There were no vitals taken for this visit.There is no height or weight on file to calculate BMI.  General Appearance: Bizarre  Eye Contact:  Good  Speech:  Normal Rate  Volume:  Normal   Mood:  Anxious  Affect:  Congruent  Thought Process:  Goal Directed  Orientation:  NA  Thought Content:  Logical  Suicidal Thoughts:  No  Homicidal Thoughts:  No  Memory:  Negative  Judgement:  Fair  Insight:  NA and Good  Psychomotor Activity:  Normal  Concentration:    Recall:  Good  Fund of Knowledge:  Language: Good  Akathisia:  No  Handed:  Right  AIMS (if indicated):    Assets:  Desire for Improvement  ADL's:  Intact  Cognition: WNL  Sleep:      Treatment Plan Summary:    This patient's diagnosis is major depression in remission.  She will continue taking desipramine  and Effexor  for this condition.  Her second problem is adjustment disorder with an anxious mood state.  She continues taking BuSpar  and a small dose of Klonopin .  She also takes Vistaril .  Today we are going to reduce her Lamictal  down to just taking 150 mg and on her next visit we will probably discontinue it.

## 2023-11-21 DIAGNOSIS — H25811 Combined forms of age-related cataract, right eye: Secondary | ICD-10-CM | POA: Diagnosis not present

## 2023-11-21 DIAGNOSIS — F418 Other specified anxiety disorders: Secondary | ICD-10-CM | POA: Diagnosis not present

## 2023-11-21 DIAGNOSIS — E039 Hypothyroidism, unspecified: Secondary | ICD-10-CM | POA: Diagnosis not present

## 2023-11-21 DIAGNOSIS — H5211 Myopia, right eye: Secondary | ICD-10-CM | POA: Diagnosis not present

## 2023-11-22 ENCOUNTER — Other Ambulatory Visit (HOSPITAL_COMMUNITY): Payer: Self-pay | Admitting: *Deleted

## 2023-11-22 ENCOUNTER — Other Ambulatory Visit (HOSPITAL_BASED_OUTPATIENT_CLINIC_OR_DEPARTMENT_OTHER): Payer: Self-pay

## 2023-11-22 ENCOUNTER — Other Ambulatory Visit: Payer: Self-pay

## 2023-11-22 DIAGNOSIS — F411 Generalized anxiety disorder: Secondary | ICD-10-CM

## 2023-11-22 DIAGNOSIS — F332 Major depressive disorder, recurrent severe without psychotic features: Secondary | ICD-10-CM

## 2023-11-22 DIAGNOSIS — F603 Borderline personality disorder: Secondary | ICD-10-CM

## 2023-11-22 MED ORDER — BUSPIRONE HCL 15 MG PO TABS
30.0000 mg | ORAL_TABLET | Freq: Two times a day (BID) | ORAL | 0 refills | Status: DC
  Filled 2023-11-23: qty 360, 90d supply, fill #0

## 2023-11-22 MED ORDER — VENLAFAXINE HCL 100 MG PO TABS
100.0000 mg | ORAL_TABLET | Freq: Three times a day (TID) | ORAL | 0 refills | Status: DC
  Filled 2023-11-22: qty 270, 90d supply, fill #0

## 2023-11-22 MED ORDER — LAMOTRIGINE 150 MG PO TABS
150.0000 mg | ORAL_TABLET | Freq: Every day | ORAL | 0 refills | Status: DC
  Filled 2023-11-22: qty 90, 90d supply, fill #0

## 2023-11-23 ENCOUNTER — Other Ambulatory Visit (HOSPITAL_COMMUNITY): Payer: Self-pay

## 2023-11-24 ENCOUNTER — Other Ambulatory Visit (HOSPITAL_COMMUNITY): Payer: Self-pay

## 2023-11-26 ENCOUNTER — Telehealth: Payer: Self-pay | Admitting: Physician Assistant

## 2023-11-26 ENCOUNTER — Other Ambulatory Visit: Payer: Self-pay

## 2023-11-26 DIAGNOSIS — G8929 Other chronic pain: Secondary | ICD-10-CM

## 2023-11-26 MED ORDER — TIZANIDINE HCL 4 MG PO TABS: 4.0000 mg | ORAL_TABLET | Freq: Three times a day (TID) | ORAL | 0 refills | Status: DC

## 2023-11-26 NOTE — Telephone Encounter (Signed)
 [facility] unable to reach patient to schedule SS-Toni

## 2023-12-04 ENCOUNTER — Ambulatory Visit: Payer: Self-pay | Attending: Rheumatology | Admitting: Rheumatology

## 2023-12-04 ENCOUNTER — Ambulatory Visit

## 2023-12-04 ENCOUNTER — Encounter: Payer: Self-pay | Admitting: Rheumatology

## 2023-12-04 VITALS — BP 122/72 | HR 65 | Resp 15 | Ht 65.0 in | Wt 347.8 lb

## 2023-12-04 DIAGNOSIS — M5134 Other intervertebral disc degeneration, thoracic region: Secondary | ICD-10-CM | POA: Diagnosis not present

## 2023-12-04 DIAGNOSIS — Z79899 Other long term (current) drug therapy: Secondary | ICD-10-CM | POA: Diagnosis not present

## 2023-12-04 DIAGNOSIS — M0579 Rheumatoid arthritis with rheumatoid factor of multiple sites without organ or systems involvement: Secondary | ICD-10-CM

## 2023-12-04 DIAGNOSIS — M62838 Other muscle spasm: Secondary | ICD-10-CM

## 2023-12-04 DIAGNOSIS — M51369 Other intervertebral disc degeneration, lumbar region without mention of lumbar back pain or lower extremity pain: Secondary | ICD-10-CM

## 2023-12-04 DIAGNOSIS — M7702 Medial epicondylitis, left elbow: Secondary | ICD-10-CM | POA: Diagnosis not present

## 2023-12-04 DIAGNOSIS — Z8659 Personal history of other mental and behavioral disorders: Secondary | ICD-10-CM

## 2023-12-04 DIAGNOSIS — Z8639 Personal history of other endocrine, nutritional and metabolic disease: Secondary | ICD-10-CM

## 2023-12-04 DIAGNOSIS — M818 Other osteoporosis without current pathological fracture: Secondary | ICD-10-CM | POA: Diagnosis not present

## 2023-12-04 DIAGNOSIS — M7062 Trochanteric bursitis, left hip: Secondary | ICD-10-CM | POA: Diagnosis not present

## 2023-12-04 DIAGNOSIS — M19041 Primary osteoarthritis, right hand: Secondary | ICD-10-CM

## 2023-12-04 DIAGNOSIS — M19042 Primary osteoarthritis, left hand: Secondary | ICD-10-CM

## 2023-12-04 DIAGNOSIS — M797 Fibromyalgia: Secondary | ICD-10-CM

## 2023-12-04 DIAGNOSIS — M5030 Other cervical disc degeneration, unspecified cervical region: Secondary | ICD-10-CM | POA: Diagnosis not present

## 2023-12-04 DIAGNOSIS — M7701 Medial epicondylitis, right elbow: Secondary | ICD-10-CM | POA: Diagnosis not present

## 2023-12-04 DIAGNOSIS — Z8719 Personal history of other diseases of the digestive system: Secondary | ICD-10-CM

## 2023-12-04 DIAGNOSIS — M170 Bilateral primary osteoarthritis of knee: Secondary | ICD-10-CM

## 2023-12-04 DIAGNOSIS — M65352 Trigger finger, left little finger: Secondary | ICD-10-CM | POA: Diagnosis not present

## 2023-12-04 DIAGNOSIS — E785 Hyperlipidemia, unspecified: Secondary | ICD-10-CM | POA: Diagnosis not present

## 2023-12-04 DIAGNOSIS — Z862 Personal history of diseases of the blood and blood-forming organs and certain disorders involving the immune mechanism: Secondary | ICD-10-CM

## 2023-12-04 LAB — CBC WITH DIFFERENTIAL/PLATELET
Absolute Lymphocytes: 1473 {cells}/uL (ref 850–3900)
Absolute Monocytes: 526 {cells}/uL (ref 200–950)
Basophils Absolute: 39 {cells}/uL (ref 0–200)
Basophils Relative: 0.7 %
Eosinophils Absolute: 297 {cells}/uL (ref 15–500)
Eosinophils Relative: 5.3 %
HCT: 37.6 % (ref 35.0–45.0)
Hemoglobin: 12.4 g/dL (ref 11.7–15.5)
MCH: 29.8 pg (ref 27.0–33.0)
MCHC: 33 g/dL (ref 32.0–36.0)
MCV: 90.4 fL (ref 80.0–100.0)
MPV: 9.7 fL (ref 7.5–12.5)
Monocytes Relative: 9.4 %
Neutro Abs: 3265 {cells}/uL (ref 1500–7800)
Neutrophils Relative %: 58.3 %
Platelets: 206 10*3/uL (ref 140–400)
RBC: 4.16 10*6/uL (ref 3.80–5.10)
RDW: 12 % (ref 11.0–15.0)
Total Lymphocyte: 26.3 %
WBC: 5.6 10*3/uL (ref 3.8–10.8)

## 2023-12-04 LAB — COMPREHENSIVE METABOLIC PANEL WITH GFR
AG Ratio: 1.9 (calc) (ref 1.0–2.5)
ALT: 13 U/L (ref 6–29)
AST: 20 U/L (ref 10–35)
Albumin: 4.2 g/dL (ref 3.6–5.1)
Alkaline phosphatase (APISO): 89 U/L (ref 37–153)
BUN/Creatinine Ratio: 39 (calc) — ABNORMAL HIGH (ref 6–22)
BUN: 28 mg/dL — ABNORMAL HIGH (ref 7–25)
CO2: 23 mmol/L (ref 20–32)
Calcium: 9.6 mg/dL (ref 8.6–10.4)
Chloride: 109 mmol/L (ref 98–110)
Creat: 0.71 mg/dL (ref 0.50–1.05)
Globulin: 2.2 g/dL (ref 1.9–3.7)
Glucose, Bld: 82 mg/dL (ref 65–99)
Potassium: 3.7 mmol/L (ref 3.5–5.3)
Sodium: 142 mmol/L (ref 135–146)
Total Bilirubin: 0.4 mg/dL (ref 0.2–1.2)
Total Protein: 6.4 g/dL (ref 6.1–8.1)
eGFR: 95 mL/min/{1.73_m2} (ref >=60)

## 2023-12-04 LAB — LIPID PANEL
Cholesterol: 154 mg/dL (ref <200)
HDL: 62 mg/dL (ref >=50)
LDL Cholesterol (Calc): 74 mg/dL
Non-HDL Cholesterol (Calc): 92 mg/dL (ref <130)
Total CHOL/HDL Ratio: 2.5 (calc) (ref <5.0)
Triglycerides: 98 mg/dL (ref <150)

## 2023-12-04 MED ORDER — TRIAMCINOLONE ACETONIDE 40 MG/ML IJ SUSP
20.0000 mg | INTRAMUSCULAR | Status: AC | PRN
Start: 2023-12-04 — End: 2023-12-04
  Administered 2023-12-04: 20 mg

## 2023-12-04 MED ORDER — LIDOCAINE HCL 1 % IJ SOLN
0.5000 mL | INTRAMUSCULAR | Status: AC | PRN
  Administered 2023-12-04: .5 mL

## 2023-12-04 NOTE — Patient Instructions (Signed)
 Vaccines You are taking a medication(s) that can suppress your immune system.  The following immunizations are recommended: Flu annually Covid-19  Td/Tdap (tetanus, diphtheria, pertussis) every 10 years Pneumonia (Prevnar 15 then Pneumovax 23 at least 1 year apart.  Alternatively, can take Prevnar 20 without needing additional dose) Shingrix: 2 doses from 4 weeks to 6 months apart  Please check with your PCP to make sure you are up to date.

## 2023-12-04 NOTE — Progress Notes (Signed)
 Office Visit Note  Patient: Jamie Bennett             Date of Birth: 1961-07-05           MRN: [id number]             PCP: Jacques Mattock, PA-C Referring: Jacques Mattock, PA* Visit Date: 12/04/2023 Occupation: @GUAROCC @  Subjective:  Left little trigger finger   History of Present Illness: Jamie Bennett is a 63 y.o. female with seropositive rheumatoid arthritis, osteoarthritis and osteoporosis.  She returns today after her last visit in December 2024.  She states she has been having discomfort in her left little finger which has been triggering recently.  She states she has difficulty extending her finger completely.  She has not noticed a flare of rheumatoid arthritis.  She continues to have some discomfort in her bilateral elbows, knee joints and her feet.  She has not noticed any joint swelling.  She continues to have some neck and lower back discomfort which is manageable.  She has generalized discomfort from fibromyalgia.    Activities of Daily Living:  Patient reports morning stiffness for 30 minutes.   Patient Denies nocturnal pain.  Difficulty dressing/grooming: Denies Difficulty climbing stairs: Reports Difficulty getting out of chair: Denies Difficulty using hands for taps, buttons, cutlery, and/or writing: Reports  Review of Systems  Constitutional:  Positive for fatigue.  HENT:  Negative for mouth sores and mouth dryness.   Eyes:  Negative for dryness.  Respiratory:  Negative for shortness of breath.   Cardiovascular:  Negative for chest pain and palpitations.  Gastrointestinal:  Positive for constipation. Negative for blood in stool and diarrhea.  Endocrine: Negative for increased urination.  Genitourinary:  Negative for involuntary urination.  Musculoskeletal:  Positive for joint pain, joint pain, joint swelling, myalgias, muscle weakness, morning stiffness and myalgias. Negative for gait problem and muscle tenderness.  Skin:  Negative for  color change, rash, hair loss and sensitivity to sunlight.  Allergic/Immunologic: Positive for susceptible to infections.  Neurological:  Positive for headaches. Negative for dizziness.  Hematological:  Negative for swollen glands.  Psychiatric/Behavioral:  Negative for depressed mood and sleep disturbance. The patient is nervous/anxious.     PMFS History:  Patient Active Problem List   Diagnosis Date Noted   Moderate persistent asthma with acute exacerbation 08/03/2022   Chronic knee pain (1ry area of Pain) (Left) 04/19/2022   Tricompartment osteoarthritis of knee (Left) 04/19/2022   Primary osteoarthritis of knee (Left) 04/19/2022   Chronic anticoagulation (Plaquenil ) 04/19/2022   History of attempted suicide (07/10/2016) 04/18/2022   History of drug overdose (11/08/2015) 04/18/2022   Chronic pain syndrome 04/17/2022   Pharmacologic therapy 04/17/2022   Disorder of skeletal system 04/17/2022   Problems influencing health status 04/17/2022   Neck pain, musculoskeletal 03/24/2020   Bipolar affective disorder, current episode mixed (HCC) 11/09/2019   Encounter for general adult medical examination with abnormal findings 06/15/2019   Closed fracture of elbow (Left) 06/15/2019   Urinary tract infection without hematuria 06/15/2019   Encounter for long-term (current) use of medications 06/15/2019   Acute upper respiratory infection 06/06/2019   Exposure to COVID-19 virus 06/06/2019   BMI 50.0-59.9, adult (HCC) 06/03/2019   Exercise-induced asthma 12/15/2018   Screening for breast cancer 05/29/2018   Duodenal ulcer disease 05/29/2018   Screening for malignant neoplasm of cervix 05/29/2018   Dysuria 05/29/2018   Surgical wound dehiscence, initial encounter 04/14/2018   Postoperative abdominal hernia with obstruction  Incarcerated ventral hernia 03/22/2018   SBO (small bowel obstruction) (HCC) 03/07/2018   Persistent umbilical sinus 02/22/2018   Atopic dermatitis 12/05/2017    Adjustment disorder with mixed anxiety and depressed mood 03/10/2017   Major depressive disorder 03/10/2017   Primary osteoarthritis of knees (Bilateral) 01/19/2017   Suicide attempt (HCC) 07/10/2016   Fibromyalgia 07/10/2016   High risk medication use 07/10/2016   Hypotension 11/09/2015   Respiratory failure (HCC)    Drug overdose (11/08/2015) (undetermined intent) 11/08/2015   Fatty infiltration of liver 02/16/2015   Hepatic fibrosis 02/16/2015   Iron  deficiency anemia 02/05/2015   Vitamin B 12 deficiency 02/05/2015   OP (osteoporosis) 06/16/2014   Rheumatoid arteritis (HCC) 03/02/2014   Hypothyroidism 03/02/2014   Rheumatic fever without heart involvement 03/02/2014   Adult hypothyroidism 03/02/2014   Arthritis of pelvic region, degenerative 03/02/2014   History of bariatric surgery 11/24/2013    Class: History of   Bipolar affective disorder (HCC) 11/24/2013   Rheumatoid arthritis (HCC) 11/24/2013   Polysubstance (excluding opioids) dependence (HCC) 09/11/2013   Polysubstance dependence (HCC) 09/11/2013   Combined drug dependence excluding opioids (HCC) 09/11/2013   Arthritis or polyarthritis, rheumatoid (HCC) 09/05/2013   Bipolar 1 disorder, depressed (HCC) 09/05/2013    Past Medical History:  Diagnosis Date   Anemia    Anxiety    Bipolar 1 disorder (HCC)    Collagen vascular disease (HCC)    rhematoid arthritis   Constipation    Dyspnea    Fibromyalgia    GERD (gastroesophageal reflux disease)    Heart murmur    mild-asymptomatic   Hepatic steatosis    History of kidney stones    History of Roux-en-Y gastric bypass    Hypotension    Hypothyroidism    Morbid obesity with BMI of 50.0-59.9, adult (HCC)    Opioid abuse (HCC)    Osteoarthritis    Osteoporosis    PONV (postoperative nausea and vomiting)    nausea only   Rheumatic fever    Rheumatoid arthritis (HCC)    Small bowel obstruction (HCC)    Thyroid  disease     Family History  Problem Relation Age of  Onset   Depression Mother    Dementia Mother    Heart disease Mother    Osteoporosis Mother    Parkinson's disease Father    Liver disease Brother    Colon cancer Neg Hx    Past Surgical History:  Procedure Laterality Date   CATARACT EXTRACTION, BILATERAL  10/2023   CHOLECYSTECTOMY  2004   COLONOSCOPY N/A 07/16/2017   Procedure: COLONOSCOPY;  Surgeon: Cassie Click, MD;  Location: [facility] ENDOSCOPY;  Service: Endoscopy;  Laterality: N/A;   EXPLORATORY LAPAROTOMY  2009   GASTRIC BYPASS  2003   [facility]   HEEL SPUR EXCISION Bilateral    KNEE ARTHROSCOPY WITH SUBCHONDROPLASTY Left 09/27/2021   Procedure: Left knee subchondroplasty medial tibial plateau;  Surgeon: Molli Angelucci, MD;  Location: [facility] ORS;  Service: Orthopedics;  Laterality: Left;   LAPAROTOMY N/A 03/01/2018   Procedure: EXPLORATORY LAPAROTOMY/ UMBILECTOMY;  Surgeon: Marshall Skeeter, MD;  Location: [facility] ORS;  Service: General;  Laterality: N/A;   TONSILLECTOMY     VENTRAL HERNIA REPAIR N/A 03/07/2018   Procedure: HERNIA REPAIR VENTRAL ADULT;  Surgeon: Marshall Skeeter, MD;  Location: [facility] ORS;  Service: General;  Laterality: N/A;   Social History   Social History Narrative   Not on file   Immunization History  Administered Date(s) Administered   Influenza  Inj Mdck Quad Pf 06/09/2022   Influenza, Quadrivalent, Recombinant, Inj, Pf 04/26/2019   Influenza,inj,Quad PF,6+ Mos 04/24/2016   Influenza-Unspecified 04/19/2021   Moderna SARS-COV2 Booster Vaccination 01/30/2021, 05/13/2021   Moderna Sars-Covid-2 Vaccination 10/25/2019, 11/22/2019, 06/02/2020   Pneumococcal Polysaccharide-23 03/02/2018     Objective: Vital Signs: BP 122/72 (BP Location: Left Arm, Patient Position: Sitting, Cuff Size: Large)   Pulse 65   Resp 15   Ht 5\' 5"  (1.651 m)   Wt (!) 347 lb 12.8 oz (157.8 kg)   BMI 57.88 kg/m    Physical Exam Vitals and nursing note reviewed.  Constitutional:      Appearance: She is  well-developed.  HENT:     Head: Normocephalic and atraumatic.  Eyes:     Conjunctiva/sclera: Conjunctivae normal.  Cardiovascular:     Rate and Rhythm: Normal rate and regular rhythm.     Heart sounds: Normal heart sounds.  Pulmonary:     Effort: Pulmonary effort is normal.     Breath sounds: Normal breath sounds.  Abdominal:     General: Bowel sounds are normal.     Palpations: Abdomen is soft.  Musculoskeletal:     Cervical back: Normal range of motion.  Lymphadenopathy:     Cervical: No cervical adenopathy.  Skin:    General: Skin is warm and dry.     Capillary Refill: Capillary refill takes less than 2 seconds.  Neurological:     Mental Status: She is alert and oriented to person, place, and time.  Psychiatric:        Behavior: Behavior normal.      Musculoskeletal Exam: Patient examined in the seated position.  Cervical spine was in good range of motion.  There was no tenderness over thoracic or lumbar spine.  Elbow joints, wrist joints, MCPs were in good range of motion.  No synovitis was noted.  Bilateral PIP and DIP thickening was noted.  Thickening of the left ring finger flexor tendon was noted.  Hip joints could not be assessed.  Knee joints with good range of motion.  There was no tenderness overlying ankles or MTPs.  CDAI Exam: CDAI Score: -- Patient Global: 30 / 100; Provider Global: 20 / 100 Swollen: --; Tender: -- Joint Exam 12/04/2023   No joint exam has been documented for this visit   There is currently no information documented on the homunculus. Go to the Rheumatology activity and complete the homunculus joint exam.  Investigation: No additional findings.  Imaging: No results found.  Recent Labs: Lab Results  Component Value Date   WBC 6.5 08/17/2023   HGB 13.1 08/17/2023   PLT 205 08/17/2023   NA 141 07/03/2023   K 4.0 07/03/2023   CL 105 07/03/2023   CO2 26 07/03/2023   GLUCOSE 80 07/03/2023   BUN 27 (H) 07/03/2023   CREATININE 0.85  07/03/2023   BILITOT 0.4 07/03/2023   ALKPHOS 105 01/10/2022   AST 18 07/03/2023   ALT 17 07/03/2023   PROT 7.3 07/03/2023   ALBUMIN 3.9 01/10/2022   CALCIUM 9.6 07/03/2023   GFRAA 99 05/26/2020    Speciality Comments: PLQ Eye Exam: 06/06/2023 WNL @ [facility] Follow up 1 year   Procedures:   Ultrasound guided injection is preferred based studies that show increased duration, increased effect, greater accuracy, decreased procedural pain, increased response rate, and decreased cost with ultrasound guided versus blind injection.   Verbal informed consent obtained.  Time-out conducted.  Noted no overlying erythema, induration, or  other signs of local infection. Ultrasound-guided left little trigger finger injection: After sterile prep with Betadine, injected 0.5 mL of 1% lidocaine  and 20 mg Kenalog  using a 27-gauge needle, in the flexor tendon sheath.   Hand/UE Inj: L small A1 for trigger finger on 12/04/2023 10:45 AM Indications: pain, tendon swelling and therapeutic Details: 27 G needle, ultrasound-guided volar approach Medications: 0.5 mL lidocaine  1 %; 20 mg triamcinolone  acetonide 40 MG/ML Aspirate: 0 mL  Increased risk of infection, tendon injury, nerve injury, dermal atrophy, hypopigmentation was discussed. Consent was given by the patient. Immediately prior to procedure a time out was called to verify the correct patient, procedure, equipment, support staff and site/side marked as required. Patient was prepped and draped in the usual sterile fashion.     Allergies: Lactose intolerance (gi) and Sulfa  antibiotics   Assessment / Plan:     Visit Diagnoses: Rheumatoid arthritis involving multiple sites with positive rheumatoid factor (HCC)-patient states that her rheumatoid arthritis well-controlled on hydroxychloroquine .  She continues to have some generalized arthralgias but no swelling.  High risk medication use - Plaquenil  200 mg 1 tablet by mouth twice daily. PLQ Eye Exam:  06/06/2023 -CBC and CMP were normal in December.  Will recheck labs today.  Plan: CBC with Differential/Platelet, Comprehensive metabolic panel with GFR.  Information for immunization was placed in the AVS.  Primary osteoarthritis of both hands-she had PIP and DIP thickening with no synovitis.  Trigger little finger of left hand-she has been having frequent triggering of her left little trigger finger.  She requested a trigger finger injection.  After informed consent was obtained and side effects were discussed left middle trigger finger was injected with lidocaine  and Kenalog  as described above.  She tolerated the procedure well.  Trochanteric bursitis of left hip-she is not in discomfort.  IT band stretches were discussed.  Primary osteoarthritis of both knees - S/p orthovisc bilateral knees 08/2020-09/2020. She underwent a left knee subchondroplasty of the medial tibial plateau on 09/27/21 performed by Dr. Mozell Arias.  Medial epicondylitis of right elbow hand medial epicondylitis of left elbow-she has intermittent symptoms.  DDD (degenerative disc disease), cervical-she good range of motion without any discomfort.  DDD (degenerative disc disease), thoracic - Postural thoracic kyphosis noted.  Degeneration of intervertebral disc of lumbar region without discogenic back pain or lower extremity pain  Other osteoporosis without current pathological fracture - Prolia  60 mg sq injections every 6 months prescribed by her endocrinologist.  Fibromyalgia-she continues to have some generalized pain and discomfort.  She states the pain is manageable.  Trapezius muscle spasm-she denies discomfort today.  Other medical problems are listed as follows:  History of gastroesophageal reflux (GERD)  History of bipolar disorder  History of anemia  History of hypothyroidism  Dyslipidemia - Plan: Lipid panel  Orders: Orders Placed This Encounter  Procedures   CBC with Differential/Platelet   Comprehensive  metabolic panel with GFR   Lipid panel   No orders of the defined types were placed in this encounter.    Follow-Up Instructions: Return in about 5 months (around 05/05/2024) for Rheumatoid arthritis, Osteoarthritis, Osteoporosis.   Nicholas Bari, MD  Note - This record has been created using Animal nutritionist.  Chart creation errors have been sought, but may not always  have been located. Such creation errors do not reflect on  the standard of medical care.

## 2023-12-05 NOTE — Progress Notes (Signed)
 CBC and CMP are normal.  Lipid panel is normal.

## 2023-12-06 DIAGNOSIS — G4733 Obstructive sleep apnea (adult) (pediatric): Secondary | ICD-10-CM | POA: Diagnosis not present

## 2023-12-07 ENCOUNTER — Other Ambulatory Visit: Payer: Self-pay | Admitting: Physician Assistant

## 2023-12-07 DIAGNOSIS — K269 Duodenal ulcer, unspecified as acute or chronic, without hemorrhage or perforation: Secondary | ICD-10-CM

## 2023-12-10 ENCOUNTER — Other Ambulatory Visit (HOSPITAL_COMMUNITY): Payer: Self-pay

## 2023-12-10 MED ORDER — SUCRALFATE 1 G PO TABS
1.0000 g | ORAL_TABLET | Freq: Two times a day (BID) | ORAL | 1 refills | Status: DC
  Filled 2023-12-10: qty 180, 90d supply, fill #0
  Filled 2024-03-07: qty 180, 90d supply, fill #1

## 2023-12-11 ENCOUNTER — Other Ambulatory Visit: Payer: Self-pay

## 2023-12-11 ENCOUNTER — Encounter: Payer: Self-pay | Admitting: Pharmacist

## 2023-12-12 ENCOUNTER — Other Ambulatory Visit (HOSPITAL_COMMUNITY): Payer: Self-pay

## 2023-12-13 ENCOUNTER — Other Ambulatory Visit (HOSPITAL_BASED_OUTPATIENT_CLINIC_OR_DEPARTMENT_OTHER): Payer: Self-pay

## 2023-12-13 ENCOUNTER — Encounter: Payer: Self-pay | Admitting: Physician Assistant

## 2023-12-13 ENCOUNTER — Ambulatory Visit (INDEPENDENT_AMBULATORY_CARE_PROVIDER_SITE_OTHER): Payer: Self-pay | Admitting: Physician Assistant

## 2023-12-13 ENCOUNTER — Other Ambulatory Visit: Payer: Self-pay

## 2023-12-13 ENCOUNTER — Other Ambulatory Visit (HOSPITAL_COMMUNITY): Payer: Self-pay

## 2023-12-13 VITALS — BP 140/86 | HR 73 | Temp 98.2°F | Resp 16 | Ht 65.0 in | Wt 346.0 lb

## 2023-12-13 DIAGNOSIS — Z6841 Body Mass Index (BMI) 40.0 and over, adult: Secondary | ICD-10-CM

## 2023-12-13 DIAGNOSIS — M25562 Pain in left knee: Secondary | ICD-10-CM | POA: Diagnosis not present

## 2023-12-13 DIAGNOSIS — R030 Elevated blood-pressure reading, without diagnosis of hypertension: Secondary | ICD-10-CM | POA: Diagnosis not present

## 2023-12-13 DIAGNOSIS — K219 Gastro-esophageal reflux disease without esophagitis: Secondary | ICD-10-CM | POA: Diagnosis not present

## 2023-12-13 DIAGNOSIS — G8929 Other chronic pain: Secondary | ICD-10-CM

## 2023-12-13 MED ORDER — ESOMEPRAZOLE MAGNESIUM 20 MG PO CPDR
20.0000 mg | DELAYED_RELEASE_CAPSULE | Freq: Every day | ORAL | 1 refills | Status: DC
  Filled 2023-12-13 (×2): qty 90, 90d supply, fill #0
  Filled 2024-03-08: qty 90, 90d supply, fill #1

## 2023-12-13 MED ORDER — TIZANIDINE HCL 4 MG PO TABS
4.0000 mg | ORAL_TABLET | Freq: Three times a day (TID) | ORAL | 1 refills | Status: AC
  Filled 2023-12-13 – 2024-04-04 (×2): qty 270, 90d supply, fill #0
  Filled 2024-07-03: qty 270, 90d supply, fill #1
  Filled 2024-07-05: qty 270, 90d supply, fill #0

## 2023-12-13 NOTE — Progress Notes (Signed)
 [facility] [address] [place], [place] [address]  Internal MEDICINE  Office Visit Note  Patient Name: Jamie Bennett  [id number]  [id number]  Date of Service: 01/01/2024  Chief Complaint  Patient presents with   Follow-up    Please discuss Tizanidine     Anxiety   Gastroesophageal Reflux    HPI Pt is here for routine follow up -Had cataract surgery and seeing much more clearly now. Very pleased with results -had taken nexium  for a long time, was switched to prilosec but doesn't work as well and wants to go back -does take tizanidine  TID, can been taking this for years  -did have HST with endo office, has not gotten results yet though. So they are doing this to see if OSA for possible for zepbound  coverage -BP normally well controlled and will monitor  Current Medication: Outpatient Encounter Medications as of 12/13/2023  Medication Sig   acetaminophen  (TYLENOL ) 500 MG tablet Take 500-1,000 mg by mouth every 6 (six) hours as needed (pain.).   Budeson-Glycopyrrol-Formoterol (BREZTRI  AEROSPHERE) 160-9-4.8 MCG/ACT AERO Inhale 2 puffs into the lungs 2 (two) times daily.   busPIRone  (BUSPAR ) 15 MG tablet Take 2 tablets (30 mg total) by mouth 2 (two) times daily.   Calcium Carbonate (CALCIUM 600 PO) Take 600 mg by mouth in the morning and at bedtime.   Cholecalciferol  125 MCG (5000 UT) TABS Take 5,000 Units by mouth in the morning.   clonazePAM  (KLONOPIN ) 0.5 MG tablet 1 bid   Coenzyme Q10 300 MG CAPS Take 300 mg by mouth in the morning.   cyanocobalamin  (,VITAMIN B-12,) 1000 MCG/ML injection Inject 1,000 mcg into the muscle every 30 (thirty) days.    denosumab  (PROLIA ) 60 MG/ML SOSY injection Inject 1 mL (60 mg total) subcutaneously as directed   desipramine  (NORPRAMIN ) 25 MG tablet Take 1 tablet (25 mg total) by mouth daily.   esomeprazole  (NEXIUM ) 20 MG capsule Take 1 capsule (20 mg total) by mouth daily.   hydroxychloroquine  (PLAQUENIL ) 200 MG tablet Take 1  tablet (200 mg total) by mouth 2 (two) times daily.   hydrOXYzine  (ATARAX ) 50 MG tablet Take 1 tablet (50 mg total) by mouth 3 (three) times daily as needed for anxiety.   hydrOXYzine  (ATARAX ) 50 MG tablet Take 1 tablet (50 mg total) by mouth 3 (three) times daily as needed for anxiety.   ipratropium-albuterol  (DUONEB) 0.5-2.5 (3) MG/3ML SOLN Take 3 mLs by nebulization every 4 (four) hours as needed.   Krill Oil 500 MG CAPS Take 500 mg by mouth daily at 12 noon.   lamoTRIgine  (LAMICTAL ) 150 MG tablet Take 1 tablet (150 mg total) by mouth daily.   levothyroxine  (SYNTHROID ) 112 MCG tablet Take 1 tablet (112 mcg total) by mouth daily before breakfast.   loratadine  (CLARITIN ) 10 MG tablet Take 10 mg by mouth daily as needed for allergies.   magnesium  oxide (MAG-OX) 400 MG tablet Take 400 mg by mouth every evening.   metoCLOPramide  (REGLAN ) 5 MG tablet Take 1 tablet (5 mg total) by mouth at bedtime.   Multiple Vitamin (MULTIVITAMIN) tablet Take 1 tablet by mouth every evening.   nystatin  (MYCOSTATIN /NYSTOP ) powder APPLY 1 APPLICATION        TOPICALLY TWO TIMES A DAY   polyethylene glycol (MIRALAX  / GLYCOLAX ) 17 g packet Take 17 g by mouth in the morning and at bedtime.   prednisoLONE acetate (PRED FORTE) 1 % ophthalmic suspension INSTILL ONE DROP INTO THE RIGHT EYE FOUR TIMES DAILY FOR 7 DAYS.  THEN INSTILL ONE DROP INTO THE RIGHT EYE TWICE DAILY FOR 7 DAYS. THEN INSTILL ONE DROP INTO THE RIGHT EYE ONCE DAILY FOR 14 DAYS. THEN STOP   psyllium (METAMUCIL) 58.6 % packet Take 1 packet by mouth in the morning.   sucralfate  (CARAFATE ) 1 g tablet Take 1 tablet (1 g total) by mouth 2 (two) times daily.   traZODone  (DESYREL ) 100 MG tablet Take 1 tablet (100 mg total) by mouth at bedtime as needed for sleep and may repeat dose one time if needed.   venlafaxine  (EFFEXOR ) 100 MG tablet Take 1 tablet (100 mg total) by mouth 3 (three) times daily with meals.   vitamin C (ASCORBIC ACID) 500 MG tablet Take 500 mg by  mouth in the morning.   zinc  gluconate 50 MG tablet Take 50 mg by mouth in the morning.   [DISCONTINUED] omeprazole  (PRILOSEC) 40 MG capsule Take 1 capsule (40 mg total) by mouth daily.   [DISCONTINUED] tiZANidine  (ZANAFLEX ) 4 MG tablet Take 1 tablet (4 mg total) by mouth 3 (three) times daily.   tiZANidine  (ZANAFLEX ) 4 MG tablet Take 1 tablet (4 mg total) by mouth 3 (three) times daily.   No facility-administered encounter medications on file as of 12/13/2023.    Surgical History: Past Surgical History:  Procedure Laterality Date   CATARACT EXTRACTION, BILATERAL  10/2023   CHOLECYSTECTOMY  2004   COLONOSCOPY N/A 07/16/2017   Procedure: COLONOSCOPY;  Surgeon: Cassie Click, MD;  Location: [facility] ENDOSCOPY;  Service: Endoscopy;  Laterality: N/A;   EXPLORATORY LAPAROTOMY  2009   GASTRIC BYPASS  2003   [facility]   HEEL SPUR EXCISION Bilateral    KNEE ARTHROSCOPY WITH SUBCHONDROPLASTY Left 09/27/2021   Procedure: Left knee subchondroplasty medial tibial plateau;  Surgeon: Molli Angelucci, MD;  Location: [facility] ORS;  Service: Orthopedics;  Laterality: Left;   LAPAROTOMY N/A 03/01/2018   Procedure: EXPLORATORY LAPAROTOMY/ UMBILECTOMY;  Surgeon: Marshall Skeeter, MD;  Location: [facility] ORS;  Service: General;  Laterality: N/A;   TONSILLECTOMY     VENTRAL HERNIA REPAIR N/A 03/07/2018   Procedure: HERNIA REPAIR VENTRAL ADULT;  Surgeon: Marshall Skeeter, MD;  Location: [facility] ORS;  Service: General;  Laterality: N/A;    Medical History: Past Medical History:  Diagnosis Date   Anemia    Anxiety    Bipolar 1 disorder (HCC)    Collagen vascular disease (HCC)    rhematoid arthritis   Constipation    Dyspnea    Fibromyalgia    GERD (gastroesophageal reflux disease)    Heart murmur    mild-asymptomatic   Hepatic steatosis    History of kidney stones    History of Roux-en-Y gastric bypass    Hypotension    Hypothyroidism    Morbid obesity with BMI of 50.0-59.9, adult (HCC)    Opioid  abuse (HCC)    Osteoarthritis    Osteoporosis    PONV (postoperative nausea and vomiting)    nausea only   Rheumatic fever    Rheumatoid arthritis (HCC)    Small bowel obstruction (HCC)    Thyroid  disease     Family History: Family History  Problem Relation Age of Onset   Depression Mother    Dementia Mother    Heart disease Mother    Osteoporosis Mother    Parkinson's disease Father    Liver disease Brother    Colon cancer Neg Hx     Social History   Socioeconomic History   Marital status: Married  Spouse name: Not on file   Number of children: Not on file   Years of education: Not on file   Highest education level: Not on file  Occupational History   Not on file  Tobacco Use   Smoking status: Never    Passive exposure: Past   Smokeless tobacco: Never  Vaping Use   Vaping status: Never Used  Substance and Sexual Activity   Alcohol use: Not Currently   Drug use: No   Sexual activity: Yes  Other Topics Concern   Not on file  Social History Narrative   Not on file   Social Drivers of Health   Financial Resource Strain: Low Risk  (12/13/2022)   Received from [facility], [facility]   Overall Financial Resource Strain (CARDIA)    Difficulty of Paying Living Expenses: Not hard at all  Food Insecurity: No Food Insecurity (12/13/2022)   Received from [facility], [facility]   Hunger Vital Sign    Worried About Running Out of Food in the Last Year: Never true    Ran Out of Food in the Last Year: Never true  Transportation Needs: No Transportation Needs (12/13/2022)   Received from [facility], [facility]   [facility] - Transportation    In the past 12 months, has lack of transportation kept you from medical appointments or from getting medications?: No    Lack of Transportation (Non-Medical): No  Physical Activity: Insufficiently Active (12/13/2022)    Received from [facility], [facility]   Exercise Vital Sign    Days of Exercise per Week: 1 day    Minutes of Exercise per Session: 10 min  Stress: Stress Concern Present (12/13/2022)   Received from [facility], [facility]   [employer] of Occupational [facility] - Occupational Stress Questionnaire    Feeling of Stress : To some extent  Social Connections: Socially Isolated (12/13/2022)   Received from [facility], [facility]   Social Connection and Isolation Panel [NHANES]    Frequency of Communication with Friends and Family: Never    Frequency of Social Gatherings with Friends and Family: Never    Attends Religious Services: Never    Database administrator or Organizations: No    Attends Banker Meetings: Never    Marital Status: Married  Catering manager Violence: Unknown (05/11/2022)   Received from [employer]   Abuse Screen    Unsafe at Home or Work/School: Not on file    Feels Threatened by Someone?: Not on file    Does Anyone Keep You from Contacting Others or Doint Things Outside the Home?: Not on file    Physical Sign of Abuse Present: Not on file      Review of Systems  Constitutional:  Negative for chills, fatigue and unexpected weight change.  HENT:  Positive for postnasal drip. Negative for congestion, rhinorrhea, sneezing and sore throat.   Eyes:  Negative for redness.  Respiratory:  Negative for chest tightness and shortness of breath.   Cardiovascular:  Negative for chest pain and palpitations.  Gastrointestinal:  Negative for abdominal pain, constipation, diarrhea, nausea and vomiting.  Genitourinary:  Negative for dysuria and frequency.  Musculoskeletal:  Positive for arthralgias. Negative for joint swelling and neck pain.  Skin:  Negative for rash.  Neurological: Negative.  Negative for tremors  and numbness.  Hematological:  Negative for adenopathy. Does not bruise/bleed easily.  Psychiatric/Behavioral:  Positive for sleep disturbance. Negative for behavioral problems (Depression) and suicidal ideas.     Vital Signs: BP (!) 140/86 Comment: 146/98  Pulse 73   Temp 98.2 F (36.8 C)   Resp 16   Ht 5\' 5"  (1.651 m)   Wt (!) 346 lb (156.9 kg)   SpO2 96%   BMI 57.58 kg/m    Physical Exam Vitals and nursing note reviewed.  Constitutional:      Appearance: Normal appearance. She is obese.  HENT:     Head: Normocephalic and atraumatic.  Eyes:     Extraocular Movements: Extraocular movements intact.  Cardiovascular:     Rate and Rhythm: Normal rate and regular rhythm.  Pulmonary:     Effort: Pulmonary effort is normal.     Breath sounds: Normal breath sounds.  Skin:    General: Skin is warm and dry.  Neurological:     Mental Status: She is alert.  Psychiatric:        Mood and Affect: Mood normal.        Behavior: Behavior normal.        Assessment/Plan: 1. Elevated BP without diagnosis of hypertension (Primary) Will monitor at home  2. Chronic pain of left knee - tiZANidine  (ZANAFLEX ) 4 MG tablet; Take 1 tablet (4 mg total) by mouth 3 (three) times daily.  Dispense: 270 tablet; Refill: 1  3. Morbid obesity with BMI of 50.0-59.9, adult (HCC) Continue to work on wt loss goals. Reports endo is doing HST for possible zepbound  coverage for OSA which would help wt loss as well.  4. GERD without esophagitis - esomeprazole  (NEXIUM ) 20 MG capsule; Take 1 capsule (20 mg total) by mouth daily.  Dispense: 90 capsule; Refill: 1   General Counseling: Ladona verbalizes understanding of the findings of todays visit and agrees with plan of treatment. I have discussed any further diagnostic evaluation that may be needed or ordered today. We also reviewed her medications today. she has been encouraged to call the office with any questions or concerns that should arise related to  todays visit.    No orders of the defined types were placed in this encounter.   Meds ordered this encounter  Medications   esomeprazole  (NEXIUM ) 20 MG capsule    Sig: Take 1 capsule (20 mg total) by mouth daily.    Dispense:  90 capsule    Refill:  1   tiZANidine  (ZANAFLEX ) 4 MG tablet    Sig: Take 1 tablet (4 mg total) by mouth 3 (three) times daily.    Dispense:  270 tablet    Refill:  1    Future refill    This patient was seen by Taylor Favia, PA-C in collaboration with Dr. Verneta Gone as a part of collaborative care agreement.   Total time spent:30 Minutes Time spent includes review of chart, medications, test results, and follow up plan with the patient.      Dr Fozia M Khan Internal medicine

## 2023-12-14 ENCOUNTER — Other Ambulatory Visit: Payer: Self-pay

## 2023-12-14 ENCOUNTER — Inpatient Hospital Stay: Payer: PPO | Attending: Oncology

## 2023-12-14 DIAGNOSIS — E538 Deficiency of other specified B group vitamins: Secondary | ICD-10-CM | POA: Diagnosis not present

## 2023-12-14 MED ORDER — CYANOCOBALAMIN 1000 MCG/ML IJ SOLN
1000.0000 ug | Freq: Once | INTRAMUSCULAR | Status: AC
  Administered 2023-12-14: 1000 ug via INTRAMUSCULAR
  Filled 2023-12-14: qty 1

## 2023-12-17 ENCOUNTER — Other Ambulatory Visit (HOSPITAL_COMMUNITY): Payer: Self-pay

## 2023-12-18 ENCOUNTER — Ambulatory Visit: Payer: Medicare HMO | Admitting: Rheumatology

## 2024-01-04 ENCOUNTER — Telehealth: Payer: Self-pay | Admitting: Physician Assistant

## 2024-01-04 NOTE — Telephone Encounter (Addendum)
 Per [facility], patient told them she scheduled SS elsewhere-Toni  S/w patient, she has SS done @ [facility]. Ordered by her endocrinologist. She will have them fax us  results-Toni

## 2024-01-14 ENCOUNTER — Inpatient Hospital Stay: Payer: PPO | Attending: Oncology

## 2024-01-14 ENCOUNTER — Other Ambulatory Visit: Payer: Self-pay

## 2024-01-14 ENCOUNTER — Other Ambulatory Visit (HOSPITAL_COMMUNITY): Payer: Self-pay

## 2024-01-14 ENCOUNTER — Other Ambulatory Visit: Payer: Self-pay | Admitting: Physician Assistant

## 2024-01-14 DIAGNOSIS — E039 Hypothyroidism, unspecified: Secondary | ICD-10-CM

## 2024-01-14 DIAGNOSIS — E538 Deficiency of other specified B group vitamins: Secondary | ICD-10-CM | POA: Insufficient documentation

## 2024-01-14 MED ORDER — LEVOTHYROXINE SODIUM 112 MCG PO TABS
112.0000 ug | ORAL_TABLET | Freq: Every day | ORAL | 0 refills | Status: DC
  Filled 2024-01-14: qty 90, 90d supply, fill #0

## 2024-01-14 MED ORDER — CYANOCOBALAMIN 1000 MCG/ML IJ SOLN
1000.0000 ug | Freq: Once | INTRAMUSCULAR | Status: AC
  Administered 2024-01-14: 1000 ug via INTRAMUSCULAR
  Filled 2024-01-14: qty 1

## 2024-01-23 ENCOUNTER — Other Ambulatory Visit (HOSPITAL_COMMUNITY): Payer: Self-pay

## 2024-01-23 ENCOUNTER — Other Ambulatory Visit: Payer: Self-pay

## 2024-01-23 MED ORDER — PROLIA 60 MG/ML ~~LOC~~ SOSY
PREFILLED_SYRINGE | SUBCUTANEOUS | 0 refills | Status: AC
  Filled 2024-02-07: qty 1, 180d supply, fill #0

## 2024-02-07 ENCOUNTER — Other Ambulatory Visit: Payer: Self-pay

## 2024-02-07 ENCOUNTER — Other Ambulatory Visit (HOSPITAL_COMMUNITY): Payer: Self-pay

## 2024-02-07 ENCOUNTER — Other Ambulatory Visit: Payer: Self-pay | Admitting: Pharmacy Technician

## 2024-02-07 NOTE — Progress Notes (Signed)
 Specialty Pharmacy Refill Coordination Note  Jamie Bennett is a 63 y.o. female contacted today regarding refills of specialty medication(s) Denosumab  (Prolia )   Patient requested Courier to Provider Office   Delivery date: 03/11/24   Verified address: [facility] - [address] [place], [place] [address]   Medication will be filled on 03/10/24. Courier to [facility] on 8/12 for appt on 03/19/24.

## 2024-02-10 ENCOUNTER — Other Ambulatory Visit: Payer: Self-pay | Admitting: Physician Assistant

## 2024-02-10 DIAGNOSIS — M0579 Rheumatoid arthritis with rheumatoid factor of multiple sites without organ or systems involvement: Secondary | ICD-10-CM

## 2024-02-10 NOTE — Telephone Encounter (Signed)
 Last Fill: 10/29/2023  Eye exam: 06/06/2023   Labs: 12/04/2023 CBC and CMP are normal.  Lipid panel is normal.   Next Visit: 02/12/2024  Last Visit: 12/04/2023  IK:Myzlfjunpi arthritis involving multiple sites with positive rheumatoid factor   Current Dose per office note 12/04/2023: Plaquenil  200 mg 1 tablet by mouth twice daily.   Okay to refill Plaquenil ?

## 2024-02-12 ENCOUNTER — Ambulatory Visit: Attending: Rheumatology | Admitting: Rheumatology

## 2024-02-12 ENCOUNTER — Ambulatory Visit (INDEPENDENT_AMBULATORY_CARE_PROVIDER_SITE_OTHER)

## 2024-02-12 ENCOUNTER — Encounter: Payer: Self-pay | Admitting: Rheumatology

## 2024-02-12 VITALS — BP 112/67 | HR 64 | Resp 18 | Ht 65.0 in | Wt 339.6 lb

## 2024-02-12 DIAGNOSIS — M19041 Primary osteoarthritis, right hand: Secondary | ICD-10-CM

## 2024-02-12 DIAGNOSIS — M797 Fibromyalgia: Secondary | ICD-10-CM

## 2024-02-12 DIAGNOSIS — M51369 Other intervertebral disc degeneration, lumbar region without mention of lumbar back pain or lower extremity pain: Secondary | ICD-10-CM

## 2024-02-12 DIAGNOSIS — M5134 Other intervertebral disc degeneration, thoracic region: Secondary | ICD-10-CM | POA: Diagnosis not present

## 2024-02-12 DIAGNOSIS — M65352 Trigger finger, left little finger: Secondary | ICD-10-CM

## 2024-02-12 DIAGNOSIS — Z862 Personal history of diseases of the blood and blood-forming organs and certain disorders involving the immune mechanism: Secondary | ICD-10-CM

## 2024-02-12 DIAGNOSIS — M818 Other osteoporosis without current pathological fracture: Secondary | ICD-10-CM

## 2024-02-12 DIAGNOSIS — M170 Bilateral primary osteoarthritis of knee: Secondary | ICD-10-CM

## 2024-02-12 DIAGNOSIS — M0579 Rheumatoid arthritis with rheumatoid factor of multiple sites without organ or systems involvement: Secondary | ICD-10-CM | POA: Diagnosis not present

## 2024-02-12 DIAGNOSIS — Z6841 Body Mass Index (BMI) 40.0 and over, adult: Secondary | ICD-10-CM

## 2024-02-12 DIAGNOSIS — Z79899 Other long term (current) drug therapy: Secondary | ICD-10-CM | POA: Diagnosis not present

## 2024-02-12 DIAGNOSIS — M5030 Other cervical disc degeneration, unspecified cervical region: Secondary | ICD-10-CM | POA: Diagnosis not present

## 2024-02-12 DIAGNOSIS — E785 Hyperlipidemia, unspecified: Secondary | ICD-10-CM

## 2024-02-12 DIAGNOSIS — M62838 Other muscle spasm: Secondary | ICD-10-CM | POA: Diagnosis not present

## 2024-02-12 DIAGNOSIS — Z8639 Personal history of other endocrine, nutritional and metabolic disease: Secondary | ICD-10-CM

## 2024-02-12 DIAGNOSIS — M19042 Primary osteoarthritis, left hand: Secondary | ICD-10-CM

## 2024-02-12 DIAGNOSIS — Z8719 Personal history of other diseases of the digestive system: Secondary | ICD-10-CM

## 2024-02-12 DIAGNOSIS — Z8659 Personal history of other mental and behavioral disorders: Secondary | ICD-10-CM

## 2024-02-12 MED ORDER — LIDOCAINE HCL 1 % IJ SOLN
0.5000 mL | INTRAMUSCULAR | Status: AC | PRN
  Administered 2024-02-12: .5 mL

## 2024-02-12 MED ORDER — TRIAMCINOLONE ACETONIDE 40 MG/ML IJ SUSP
20.0000 mg | INTRAMUSCULAR | Status: AC | PRN
  Administered 2024-02-12: 20 mg via INTRA_ARTICULAR

## 2024-02-12 NOTE — Progress Notes (Signed)
 Office Visit Note  Patient: Jamie Bennett             Date of Birth: 12/30/1960           MRN: [id number]             PCP: Kristina Tinnie POUR, PA-C Referring: Kristina Tinnie POUR, PA* Visit Date: 02/12/2024 Occupation: @GUAROCC @  Subjective:  Pain in both hands   History of Present Illness: BRITTANIA SUDBECK is a 63 y.o. female with seropositive rheumatoid arthritis, osteoarthritis and osteoporosis.    She had left little trigger finger injection in May 2025.  She had good response to trigger finger injection.  She states for the last 6 weeks she has been having pain and discomfort in her left second MCP joint.  She states that the joint feels warm.  Has been taking hydroxychloroquine  200 mg p.o. twice daily.  None of the other joints are painful.    Activities of Daily Living:  Patient reports morning stiffness for 30 minutes.   Patient Reports nocturnal pain.  Difficulty dressing/grooming: Denies Difficulty climbing stairs: Reports Difficulty getting out of chair: Denies Difficulty using hands for taps, buttons, cutlery, and/or writing: Denies  Review of Systems  Constitutional:  Positive for fatigue.  HENT:  Negative for mouth sores and mouth dryness.   Eyes:  Negative for dryness.  Respiratory:  Positive for shortness of breath.   Cardiovascular:  Negative for chest pain and palpitations.  Gastrointestinal:  Negative for blood in stool, constipation and diarrhea.  Endocrine: Negative for increased urination.  Genitourinary:  Negative for involuntary urination.  Musculoskeletal:  Positive for joint pain, joint pain, joint swelling, myalgias, muscle weakness, morning stiffness, muscle tenderness and myalgias. Negative for gait problem.  Skin:  Negative for color change, rash, hair loss and sensitivity to sunlight.  Allergic/Immunologic: Negative for susceptible to infections.  Neurological:  Positive for headaches. Negative for dizziness.  Hematological:  Negative  for swollen glands.  Psychiatric/Behavioral:  Positive for depressed mood. Negative for sleep disturbance. The patient is nervous/anxious.     PMFS History:  Patient Active Problem List   Diagnosis Date Noted   Moderate persistent asthma with acute exacerbation 08/03/2022   Chronic knee pain (1ry area of Pain) (Left) 04/19/2022   Tricompartment osteoarthritis of knee (Left) 04/19/2022   Primary osteoarthritis of knee (Left) 04/19/2022   Chronic anticoagulation (Plaquenil ) 04/19/2022   History of attempted suicide (07/10/2016) 04/18/2022   History of drug overdose (11/08/2015) 04/18/2022   Chronic pain syndrome 04/17/2022   Pharmacologic therapy 04/17/2022   Disorder of skeletal system 04/17/2022   Problems influencing health status 04/17/2022   Neck pain, musculoskeletal 03/24/2020   Bipolar affective disorder, current episode mixed (HCC) 11/09/2019   Encounter for general adult medical examination with abnormal findings 06/15/2019   Closed fracture of elbow (Left) 06/15/2019   Urinary tract infection without hematuria 06/15/2019   Encounter for long-term (current) use of medications 06/15/2019   Acute upper respiratory infection 06/06/2019   Exposure to COVID-19 virus 06/06/2019   BMI 50.0-59.9, adult (HCC) 06/03/2019   Exercise-induced asthma 12/15/2018   Screening for breast cancer 05/29/2018   Duodenal ulcer disease 05/29/2018   Screening for malignant neoplasm of cervix 05/29/2018   Dysuria 05/29/2018   Surgical wound dehiscence, initial encounter 04/14/2018   Postoperative abdominal hernia with obstruction    Incarcerated ventral hernia 03/22/2018   SBO (small bowel obstruction) (HCC) 03/07/2018   Persistent umbilical sinus 02/22/2018   Atopic dermatitis 12/05/2017  Adjustment disorder with mixed anxiety and depressed mood 03/10/2017   Major depressive disorder 03/10/2017   Primary osteoarthritis of knees (Bilateral) 01/19/2017   Suicide attempt (HCC) 07/10/2016    Fibromyalgia 07/10/2016   High risk medication use 07/10/2016   Hypotension 11/09/2015   Respiratory failure (HCC)    Drug overdose (11/08/2015) (undetermined intent) 11/08/2015   Fatty infiltration of liver 02/16/2015   Hepatic fibrosis 02/16/2015   Iron  deficiency anemia 02/05/2015   Vitamin B 12 deficiency 02/05/2015   OP (osteoporosis) 06/16/2014   Rheumatoid arteritis (HCC) 03/02/2014   Hypothyroidism 03/02/2014   Rheumatic fever without heart involvement 03/02/2014   Adult hypothyroidism 03/02/2014   Arthritis of pelvic region, degenerative 03/02/2014   History of bariatric surgery 11/24/2013    Class: History of   Bipolar affective disorder (HCC) 11/24/2013   Rheumatoid arthritis (HCC) 11/24/2013   Polysubstance (excluding opioids) dependence (HCC) 09/11/2013   Polysubstance dependence (HCC) 09/11/2013   Combined drug dependence excluding opioids (HCC) 09/11/2013   Arthritis or polyarthritis, rheumatoid (HCC) 09/05/2013   Bipolar 1 disorder, depressed (HCC) 09/05/2013    Past Medical History:  Diagnosis Date   Anemia    Anxiety    Bipolar 1 disorder (HCC)    Collagen vascular disease (HCC)    rhematoid arthritis   Constipation    Dyspnea    Fibromyalgia    GERD (gastroesophageal reflux disease)    Heart murmur    mild-asymptomatic   Hepatic steatosis    History of kidney stones    History of Roux-en-Y gastric bypass    Hypotension    Hypothyroidism    Morbid obesity with BMI of 50.0-59.9, adult (HCC)    Opioid abuse (HCC)    Osteoarthritis    Osteoporosis    PONV (postoperative nausea and vomiting)    nausea only   Rheumatic fever    Rheumatoid arthritis (HCC)    Small bowel obstruction (HCC)    Thyroid  disease     Family History  Problem Relation Age of Onset   Depression Mother    Dementia Mother    Heart disease Mother    Osteoporosis Mother    Parkinson's disease Father    Liver disease Brother    Colon cancer Neg Hx    Past Surgical History:   Procedure Laterality Date   CATARACT EXTRACTION, BILATERAL  10/2023   CHOLECYSTECTOMY  2004   COLONOSCOPY N/A 07/16/2017   Procedure: COLONOSCOPY;  Surgeon: Viktoria Lamar DASEN, MD;  Location: [facility] ENDOSCOPY;  Service: Endoscopy;  Laterality: N/A;   EXPLORATORY LAPAROTOMY  2009   GASTRIC BYPASS  2003   [facility]   HEEL SPUR EXCISION Bilateral    KNEE ARTHROSCOPY WITH SUBCHONDROPLASTY Left 09/27/2021   Procedure: Left knee subchondroplasty medial tibial plateau;  Surgeon: Kathlynn Sharper, MD;  Location: [facility] ORS;  Service: Orthopedics;  Laterality: Left;   LAPAROTOMY N/A 03/01/2018   Procedure: EXPLORATORY LAPAROTOMY/ UMBILECTOMY;  Surgeon: Dessa Reyes ORN, MD;  Location: [facility] ORS;  Service: General;  Laterality: N/A;   TONSILLECTOMY     VENTRAL HERNIA REPAIR N/A 03/07/2018   Procedure: HERNIA REPAIR VENTRAL ADULT;  Surgeon: Dessa Reyes ORN, MD;  Location: [facility] ORS;  Service: General;  Laterality: N/A;   Social History   Social History Narrative   Not on file   Immunization History  Administered Date(s) Administered   Influenza Inj Mdck Quad Pf 06/09/2022   Influenza, Quadrivalent, Recombinant, Inj, Pf 04/26/2019   Influenza,inj,Quad PF,6+ Mos 04/24/2016   Influenza-Unspecified 04/19/2021  Moderna SARS-COV2 Booster Vaccination 01/30/2021, 05/13/2021   Moderna Sars-Covid-2 Vaccination 10/25/2019, 11/22/2019, 06/02/2020   Pneumococcal Polysaccharide-23 03/02/2018   Zoster Recombinant(Shingrix) 10/12/2021, 12/19/2021     Objective: Vital Signs: BP 112/67 (BP Location: Left Arm, Patient Position: Sitting)   Pulse 64   Resp 18   Ht 5' 5 (1.651 m)   Wt (!) 339 lb 9.6 oz (154 kg)   BMI 56.51 kg/m    Physical Exam Vitals and nursing note reviewed.  Constitutional:      Appearance: She is well-developed.  HENT:     Head: Normocephalic and atraumatic.  Eyes:     Conjunctiva/sclera: Conjunctivae normal.  Cardiovascular:     Rate and Rhythm: Normal rate and regular  rhythm.     Heart sounds: Normal heart sounds.  Pulmonary:     Effort: Pulmonary effort is normal.     Breath sounds: Normal breath sounds.  Abdominal:     General: Bowel sounds are normal.     Palpations: Abdomen is soft.  Musculoskeletal:     Cervical back: Normal range of motion.  Lymphadenopathy:     Cervical: No cervical adenopathy.  Skin:    General: Skin is warm and dry.     Capillary Refill: Capillary refill takes less than 2 seconds.  Neurological:     Mental Status: She is alert and oriented to person, place, and time.  Psychiatric:        Behavior: Behavior normal.      Musculoskeletal Exam: Patient was examined in the seated position.  Cervical spine was in good range of motion.  Thoracic kyphosis was noted.  There was no tenderness over thoracic or lumbar spine.  Shoulders and elbow joints in good range of motion.  Wrist joints in good range of motion.  She had tenderness and swelling over her left second MCP joint.  PIP and DIP thickening was noted.  Hip joints could not be examined.  Knee joints were in good range of motion without tenderness.  There was no tenderness over ankles or MTPs.  CDAI Exam: CDAI Score: -- Patient Global: --; Provider Global: -- Swollen: --; Tender: -- Joint Exam 02/12/2024   No joint exam has been documented for this visit   There is currently no information documented on the homunculus. Go to the Rheumatology activity and complete the homunculus joint exam.  Investigation: No additional findings.  Imaging: No results found.  Recent Labs: Lab Results  Component Value Date   WBC 5.6 12/04/2023   HGB 12.4 12/04/2023   PLT 206 12/04/2023   NA 142 12/04/2023   K 3.7 12/04/2023   CL 109 12/04/2023   CO2 23 12/04/2023   GLUCOSE 82 12/04/2023   BUN 28 (H) 12/04/2023   CREATININE 0.71 12/04/2023   BILITOT 0.4 12/04/2023   ALKPHOS 105 01/10/2022   AST 20 12/04/2023   ALT 13 12/04/2023   PROT 6.4 12/04/2023   ALBUMIN 3.9  01/10/2022   CALCIUM 9.6 12/04/2023   GFRAA 99 05/26/2020    Speciality Comments: PLQ Eye Exam: 06/06/2023 WNL @ [facility] Follow up 1 year   Procedures: Ultrasound guided injection is preferred based studies that show increased duration, increased effect, greater accuracy, decreased procedural pain, increased response rate, and decreased cost with ultrasound guided versus blind injection.   Verbal informed consent obtained.  Time-out conducted.  Noted no overlying erythema, induration, or other signs of local infection. Ultrasound-guided MCP injection: After sterile prep with Betadine, injected 0.5 mL of 1%  lidocaine  and 10 mg Kenalog  using a 27-gauge needle, in the MCP joint.     Small Joint Inj: L index MCP on 02/12/2024 2:10 PM Indications: pain and joint swelling Details: 27 G needle, ultrasound-guided dorsal approach  Spinal Needle: No  Medications: 0.5 mL lidocaine  1 %; 20 mg triamcinolone  acetonide 40 MG/ML Aspirate: 0 mL  Risk of infection, tendon injury, nerve injury, discoloration, hypopigmentation and dermal atrophy were discussed. Procedure, treatment alternatives, risks and benefits explained, specific risks discussed. Consent was given by the patient. Immediately prior to procedure a time out was called to verify the correct patient, procedure, equipment, support staff and site/side marked as required. Patient was prepped and draped in the usual sterile fashion.     Allergies: Lactose intolerance (gi) and Sulfa  antibiotics   Assessment / Plan:     Visit Diagnoses: Rheumatoid arthritis involving multiple sites with positive rheumatoid factor (HCC)-patient complains of increased pain and discomfort in her left hand.  She has been having pain and swelling in the left second MCP joint for the last 6 weeks.  She states that she has been taking Plaquenil  on a regular basis without any interruption.  High risk medication use - Plaquenil  200 mg p.o. twice daily, Plaquenil   eye exam June 06, 2023.  CBC and CMP were normal in May 2025.  She will have repeat labs in October.  Primary osteoarthritis of both hands-she had bilateral PIP and DIP thickening.  Synovitis was noted over the left second MCP joint.  Patient requested a cortisone injection.  After informed consent was obtained and side effects were discussed the left second MCP joint was injected as described above.  Patient tolerated the procedure well.  Postprocedure instructions were given.  Trigger little finger of left hand - Trigger finger injection Dec 04, 2023.  She good response to injection.  Trochanteric bursitis of left hip-resolved.  Primary osteoarthritis of both knees - status post viscosupplement injections March 2022.  Status post left knee subchondral plasty of the medial tibial plateau February 2023 by Dr. Kathlynn.  Bilateral medial epicondylitis of elbow joint-improved.  DDD (degenerative disc disease), cervical-she continues to have some stiffness.  DDD (degenerative disc disease), thoracic-she noted tenderness on the examination today.  Degeneration of intervertebral disc of lumbar region without discogenic back pain or lower extremity pain-she is intermittent pain.  No radiculopathy was noted today.  Other osteoporosis without current pathological fracture - On Prolia  60 mg subcu every 6 months by her endocrinologist.  Trapezius muscle spasm  Fibromyalgia-she continues to have some generalized pain and discomfort from fibromyalgia.  Need for regular exercise and stretching was discussed.  Other medical problems listed as follows:  History of anemia  Dyslipidemia  History of gastroesophageal reflux (GERD)  History of bipolar disorder  History of hypothyroidism  BMI 50.0-59.9, adult (HCC)  Orders: No orders of the defined types were placed in this encounter.  No orders of the defined types were placed in this encounter.    Follow-Up Instructions: Return in about 5  months (around 07/14/2024) for Rheumatoid arthritis.   Maya Nash, MD  Note - This record has been created using Animal nutritionist.  Chart creation errors have been sought, but may not always  have been located. Such creation errors do not reflect on  the standard of medical care.

## 2024-02-12 NOTE — Patient Instructions (Signed)
 Standing Labs We placed an order today for your standing lab work.   Please have your standing labs drawn in October  Please have your labs drawn 2 weeks prior to your appointment so that the provider can discuss your lab results at your appointment, if possible.  Please note that you may see your imaging and lab results in MyChart before we have reviewed them. We will contact you once all results are reviewed. Please allow our office up to 72 hours to thoroughly review all of the results before contacting the office for clarification of your results.  WALK-IN LAB HOURS  Monday through Thursday from 8:00 am -12:30 pm and 1:00 pm-4:30 pm and Friday from 8:00 am-12:00 pm.  Patients with office visits requiring labs will be seen before walk-in labs.  You may encounter longer than normal wait times. Please allow additional time. Wait times may be shorter on  Monday and Thursday afternoons.  We do not book appointments for walk-in labs. We appreciate your patience and understanding with our staff.   Labs are drawn by [facility]. Please bring your co-pay at the time of your lab draw.  You may receive a bill from [facility] for your lab work.  Please note if you are on Hydroxychloroquine  and and an order has been placed for a Hydroxychloroquine  level,  you will need to have it drawn 4 hours or more after your last dose.  If you wish to have your labs drawn at another location, please call the office 24 hours in advance so we can fax the orders.  The office is located at [address], [address], [place], [place] [address]   If you have any questions regarding directions or hours of operation,  please call [phone].   As a reminder, please drink plenty of water  prior to coming for your lab work. Thanks!

## 2024-02-13 ENCOUNTER — Inpatient Hospital Stay: Payer: PPO

## 2024-02-13 ENCOUNTER — Inpatient Hospital Stay: Payer: PPO | Attending: Oncology

## 2024-02-13 DIAGNOSIS — D509 Iron deficiency anemia, unspecified: Secondary | ICD-10-CM

## 2024-02-13 DIAGNOSIS — E538 Deficiency of other specified B group vitamins: Secondary | ICD-10-CM | POA: Diagnosis not present

## 2024-02-13 LAB — CBC (CANCER CENTER ONLY)
HCT: 38.5 % (ref 36.0–46.0)
Hemoglobin: 12.6 g/dL (ref 12.0–15.0)
MCH: 29.9 pg (ref 26.0–34.0)
MCHC: 32.7 g/dL (ref 30.0–36.0)
MCV: 91.4 fL (ref 80.0–100.0)
Platelet Count: 179 K/uL (ref 150–400)
RBC: 4.21 MIL/uL (ref 3.87–5.11)
RDW: 12.9 % (ref 11.5–15.5)
WBC Count: 5.6 K/uL (ref 4.0–10.5)
nRBC: 0 % (ref 0.0–0.2)

## 2024-02-13 LAB — IRON AND TIBC
Iron: 92 ug/dL (ref 28–170)
Saturation Ratios: 22 % (ref 10.4–31.8)
TIBC: 421 ug/dL (ref 250–450)
UIBC: 329 ug/dL

## 2024-02-13 LAB — FERRITIN: Ferritin: 40 ng/mL (ref 11–307)

## 2024-02-13 MED ORDER — CYANOCOBALAMIN 1000 MCG/ML IJ SOLN
1000.0000 ug | Freq: Once | INTRAMUSCULAR | Status: AC
  Administered 2024-02-13: 1000 ug via INTRAMUSCULAR
  Filled 2024-02-13: qty 1

## 2024-02-19 ENCOUNTER — Other Ambulatory Visit (HOSPITAL_BASED_OUTPATIENT_CLINIC_OR_DEPARTMENT_OTHER): Payer: Self-pay

## 2024-02-19 ENCOUNTER — Ambulatory Visit (HOSPITAL_BASED_OUTPATIENT_CLINIC_OR_DEPARTMENT_OTHER): Admitting: Psychiatry

## 2024-02-19 DIAGNOSIS — F411 Generalized anxiety disorder: Secondary | ICD-10-CM | POA: Diagnosis not present

## 2024-02-19 DIAGNOSIS — F603 Borderline personality disorder: Secondary | ICD-10-CM

## 2024-02-19 DIAGNOSIS — F325 Major depressive disorder, single episode, in full remission: Secondary | ICD-10-CM

## 2024-02-19 DIAGNOSIS — F332 Major depressive disorder, recurrent severe without psychotic features: Secondary | ICD-10-CM | POA: Diagnosis not present

## 2024-02-19 MED ORDER — VENLAFAXINE HCL 100 MG PO TABS
100.0000 mg | ORAL_TABLET | Freq: Three times a day (TID) | ORAL | 0 refills | Status: DC
  Filled 2024-02-19: qty 270, 90d supply, fill #0

## 2024-02-19 MED ORDER — DESIPRAMINE HCL 25 MG PO TABS
25.0000 mg | ORAL_TABLET | Freq: Every day | ORAL | 4 refills | Status: DC
Start: 2024-02-19 — End: 2024-05-21

## 2024-02-19 MED ORDER — CLONAZEPAM 0.5 MG PO TABS
ORAL_TABLET | ORAL | 5 refills | Status: DC
Start: 2024-02-19 — End: 2024-02-20

## 2024-02-19 MED ORDER — LAMOTRIGINE 25 MG PO TABS: ORAL_TABLET | ORAL | 0 refills | Status: DC

## 2024-02-19 NOTE — Progress Notes (Signed)
 `  Psychiatric Initial Adult Assessment   Patient Identification: Jamie Bennett MRN:  [id number] Date of Evaluation:  02/19/2024 Referral Source: grams per previous psychiatrist Chief Complaint:   Today the patient is doing reasonably well.  She is seen here with her husband.  The patient is a on ZepBound  and has lost 17 pounds in the last 6 weeks.  The patient's mood is good.  She denies daily depression.  She is sleeping well and eating well.  She watches a lot of TV.  She is enjoying life.  They have not sold the house yet intensive [place]  but they are working on it.  Patient has noted no change with the reduction of Lamictal  from 300 mg to 150.    Visit Diagnosis: borderline personality disorder   ICD-10-CM   1. Borderline personality disorder (HCC)  F60.3 lamoTRIgine  (LAMICTAL ) 25 MG tablet    venlafaxine  (EFFEXOR ) 100 MG tablet    2. Major depressive disorder, recurrent, severe without psychotic features (HCC)  F33.2 lamoTRIgine  (LAMICTAL ) 25 MG tablet    venlafaxine  (EFFEXOR ) 100 MG tablet    3. Anxiety state  F41.1 venlafaxine  (EFFEXOR ) 100 MG tablet    clonazePAM  (KLONOPIN ) 0.5 MG tablet       History of Pre Depression Symptoms:  fatigue, (Hypo) Manic Symptoms:   Anxiety Symptoms:   Psychotic Symptoms:   PTSD Symptoms:   Past Psychiatric History: 10 psychiatric hospitalizations multiple psychotropic medications presently in psychotherapy  Previous Psychotropic Medications: Yes   Substance Abuse History in the last 12 months:  Yes.    Consequences of Substance Abuse:   Past Medical History:  Past Medical History:  Diagnosis Date   Anemia    Anxiety    Bipolar 1 disorder (HCC)    Collagen vascular disease (HCC)    rhematoid arthritis   Constipation    Dyspnea    Fibromyalgia    GERD (gastroesophageal reflux disease)    Heart murmur    mild-asymptomatic   Hepatic steatosis    History of kidney stones    History of Roux-en-Y gastric bypass     Hypotension    Hypothyroidism    Morbid obesity with BMI of 50.0-59.9, adult (HCC)    Opioid abuse (HCC)    Osteoarthritis    Osteoporosis    PONV (postoperative nausea and vomiting)    nausea only   Rheumatic fever    Rheumatoid arthritis (HCC)    Small bowel obstruction (HCC)    Thyroid  disease     Past Surgical History:  Procedure Laterality Date   CATARACT EXTRACTION, BILATERAL  10/2023   CHOLECYSTECTOMY  2004   COLONOSCOPY N/A 07/16/2017   Procedure: COLONOSCOPY;  Surgeon: Viktoria Lamar DASEN, MD;  Location: [facility] ENDOSCOPY;  Service: Endoscopy;  Laterality: N/A;   EXPLORATORY LAPAROTOMY  2009   GASTRIC BYPASS  2003   [facility]   HEEL SPUR EXCISION Bilateral    KNEE ARTHROSCOPY WITH SUBCHONDROPLASTY Left 09/27/2021   Procedure: Left knee subchondroplasty medial tibial plateau;  Surgeon: Kathlynn Sharper, MD;  Location: [facility] ORS;  Service: Orthopedics;  Laterality: Left;   LAPAROTOMY N/A 03/01/2018   Procedure: EXPLORATORY LAPAROTOMY/ UMBILECTOMY;  Surgeon: Dessa Reyes ORN, MD;  Location: [facility] ORS;  Service: General;  Laterality: N/A;   TONSILLECTOMY     VENTRAL HERNIA REPAIR N/A 03/07/2018   Procedure: HERNIA REPAIR VENTRAL ADULT;  Surgeon: Dessa Reyes ORN, MD;  Location: [facility] ORS;  Service: General;  Laterality: N/A;    Family Psychiatric History:  Family History:  Family History  Problem Relation Age of Onset   Depression Mother    Dementia Mother    Heart disease Mother    Osteoporosis Mother    Parkinson's disease Father    Liver disease Brother    Colon cancer Neg Hx     Social History:   Social History   Socioeconomic History   Marital status: Married    Spouse name: Not on file   Number of children: Not on file   Years of education: Not on file   Highest education level: Not on file  Occupational History   Not on file  Tobacco Use   Smoking status: Never    Passive exposure: Past   Smokeless tobacco: Never  Vaping Use   Vaping status:  Never Used  Substance and Sexual Activity   Alcohol use: Not Currently   Drug use: No   Sexual activity: Yes  Other Topics Concern   Not on file  Social History Narrative   Not on file   Social Drivers of Health   Financial Resource Strain: Low Risk  (12/13/2022)   Received from [facility]   Overall Financial Resource Strain (CARDIA)    Difficulty of Paying Living Expenses: Not hard at all  Food Insecurity: No Food Insecurity (12/13/2022)   Received from [facility]   Hunger Vital Sign    Within the past 12 months, you worried that your food would run out before you got the money to buy more.: Never true    Within the past 12 months, the food you bought just didn't last and you didn't have money to get more.: Never true  Transportation Needs: No Transportation Needs (12/13/2022)   Received from [facility] - Transportation    In the past 12 months, has lack of transportation kept you from medical appointments or from getting medications?: No    Lack of Transportation (Non-Medical): No  Physical Activity: Insufficiently Active (12/13/2022)   Received from [facility]   Exercise Vital Sign    On average, how many days per week do you engage in moderate to strenuous exercise (like a brisk walk)?: 1 day    On average, how many minutes do you engage in exercise at this level?: 10 min  Stress: Stress Concern Present (12/13/2022)   Received from [facility] - Occupational Stress Questionnaire    Feeling of Stress : To some extent  Social Connections: Socially Isolated (12/13/2022)   Received from [facility]   Social Connection and Isolation Panel    In a typical week, how many times do you talk on the phone with family, friends, or neighbors?: Never    How often do you get together with friends or relatives?: Never    How often  do you attend church or religious services?: Never    Do you belong to any clubs or organizations such as church groups, unions, fraternal or athletic groups, or school groups?: No    How often do you attend meetings of the clubs or organizations you belong to?: Never    Are you married, widowed, divorced, separated, never married, or living with a partner?: Married    Additional Social History:   Allergies:   Allergies  Allergen Reactions   Lactose Intolerance (Gi) Other (See Comments)    Bloating and GI distress   Sulfa  Antibiotics  Hives    Metabolic Disorder Labs: Lab Results  Component Value Date   HGBA1C 5.0 09/01/2021   No results found for: PROLACTIN Lab Results  Component Value Date   CHOL 154 12/04/2023   TRIG 98 12/04/2023   HDL 62 12/04/2023   CHOLHDL 2.5 12/04/2023   LDLCALC 74 12/04/2023   LDLCALC 73 05/26/2020     Current Medications: Current Outpatient Medications  Medication Sig Dispense Refill   acetaminophen  (TYLENOL ) 500 MG tablet Take 500-1,000 mg by mouth every 6 (six) hours as needed (pain.).     Budeson-Glycopyrrol-Formoterol (BREZTRI  AEROSPHERE) 160-9-4.8 MCG/ACT AERO Inhale 2 puffs into the lungs 2 (two) times daily. 10.7 g 11   busPIRone  (BUSPAR ) 15 MG tablet Take 2 tablets (30 mg total) by mouth 2 (two) times daily. 360 tablet 0   Calcium Carbonate (CALCIUM 600 PO) Take 600 mg by mouth in the morning and at bedtime.     Cholecalciferol  125 MCG (5000 UT) TABS Take 5,000 Units by mouth in the morning.     clonazePAM  (KLONOPIN ) 0.5 MG tablet 1 bid 60 tablet 5   Coenzyme Q10 300 MG CAPS Take 300 mg by mouth in the morning.     cyanocobalamin  (,VITAMIN B-12,) 1000 MCG/ML injection Inject 1,000 mcg into the muscle every 30 (thirty) days.      denosumab  (PROLIA ) 60 MG/ML SOSY injection Inject 1 mL (60 mg total) subcutaneously as directed (Patient not taking: Reported on 02/12/2024) 1 mL 0   denosumab  (PROLIA ) 60 MG/ML SOSY injection Inject 1 mL (60 mg  total) subcutaneously as directed 1 mL 0   desipramine  (NORPRAMIN ) 25 MG tablet Take 1 tablet (25 mg total) by mouth daily. 100 tablet 4   esomeprazole  (NEXIUM ) 20 MG capsule Take 1 capsule (20 mg total) by mouth daily. 90 capsule 1   hydroxychloroquine  (PLAQUENIL ) 200 MG tablet Take 1 tablet by mouth twice daily 180 tablet 0   hydrOXYzine  (ATARAX ) 50 MG tablet Take 1 tablet (50 mg total) by mouth 3 (three) times daily as needed for anxiety. (Patient not taking: Reported on 02/12/2024) 270 tablet 2   hydrOXYzine  (ATARAX ) 50 MG tablet Take 1 tablet (50 mg total) by mouth 3 (three) times daily as needed for anxiety. 270 tablet 1   ipratropium-albuterol  (DUONEB) 0.5-2.5 (3) MG/3ML SOLN Take 3 mLs by nebulization every 4 (four) hours as needed. (Patient not taking: Reported on 02/12/2024) 360 mL 1   Krill Oil 500 MG CAPS Take 500 mg by mouth daily at 12 noon.     lamoTRIgine  (LAMICTAL ) 25 MG tablet 2 at bedtime for 2weeks then 1 at bedtime for 1 week then DC 60 tablet 0   levothyroxine  (SYNTHROID ) 112 MCG tablet Take 1 tablet (112 mcg total) by mouth daily before breakfast. 90 tablet 0   loratadine  (CLARITIN ) 10 MG tablet Take 10 mg by mouth daily as needed for allergies.     magnesium  oxide (MAG-OX) 400 MG tablet Take 400 mg by mouth every evening.     metoCLOPramide  (REGLAN ) 5 MG tablet Take 1 tablet (5 mg total) by mouth at bedtime. 90 tablet 5   Multiple Vitamin (MULTIVITAMIN) tablet Take 1 tablet by mouth every evening.     nystatin  (MYCOSTATIN /NYSTOP ) powder APPLY 1 APPLICATION        TOPICALLY TWO TIMES A DAY 30 g 6   polyethylene glycol (MIRALAX  / GLYCOLAX ) 17 g packet Take 17 g by mouth in the morning and at bedtime.     prednisoLONE acetate (PRED  FORTE) 1 % ophthalmic suspension INSTILL ONE DROP INTO THE RIGHT EYE FOUR TIMES DAILY FOR 7 DAYS. THEN INSTILL ONE DROP INTO THE RIGHT EYE TWICE DAILY FOR 7 DAYS. THEN INSTILL ONE DROP INTO THE RIGHT EYE ONCE DAILY FOR 14 DAYS. THEN STOP (Patient not  taking: Reported on 02/12/2024)     psyllium (METAMUCIL) 58.6 % packet Take 1 packet by mouth in the morning.     sucralfate  (CARAFATE ) 1 g tablet Take 1 tablet (1 g total) by mouth 2 (two) times daily. 180 tablet 1   tirzepatide  10 MG/0.5ML injection vial Inject 5 mg into the skin.     tiZANidine  (ZANAFLEX ) 4 MG tablet Take 1 tablet (4 mg total) by mouth 3 (three) times daily. 270 tablet 1   traZODone  (DESYREL ) 100 MG tablet Take 1 tablet (100 mg total) by mouth at bedtime as needed for sleep and may repeat dose one time if needed. 60 tablet 5   venlafaxine  (EFFEXOR ) 100 MG tablet Take 1 tablet (100 mg total) by mouth 3 (three) times daily with meals. 270 tablet 0   vitamin C (ASCORBIC ACID) 500 MG tablet Take 500 mg by mouth in the morning.     zinc  gluconate 50 MG tablet Take 50 mg by mouth in the morning.     No current facility-administered medications for this visit.    Neurologic: Headache: No Seizure: No Paresthesias:NA  Musculoskeletal: Strength & Muscle Tone: within normal limits Gait & Station: normal Patient leans: N/A  Psychiatric Specialty Exam: ROS  There were no vitals taken for this visit.There is no height or weight on file to calculate BMI.  General Appearance: Bizarre  Eye Contact:  Good  Speech:  Normal Rate  Volume:  Normal  Mood:  Anxious  Affect:  Congruent  Thought Process:  Goal Directed  Orientation:  NA  Thought Content:  Logical  Suicidal Thoughts:  No  Homicidal Thoughts:  No  Memory:  Negative  Judgement:  Fair  Insight:  NA and Good  Psychomotor Activity:  Normal  Concentration:    Recall:  Good  Fund of Knowledge:  Language: Good  Akathisia:  No  Handed:  Right  AIMS (if indicated):    Assets:  Desire for Improvement  ADL's:  Intact  Cognition: WNL  Sleep:      Treatment Plan Summary:   Today the date is 02/19/2024.  The patient is doing well.  She is stable.  Today we are going to discontinue her Lamictal  by changing it to 50 mg  for 2 weeks then 25 mg for 1 week and discontinue.  Her diagnosis is major depression.  She will continue taking desipramine  and Effexor .  Her second diagnosis is adjustment disorder with an anxious mood state.  She will continue taking Klonopin  and BuSpar  and Vistaril  for this condition.  The patient is doing well.  She is stable.  She will return to see me in 3 months.

## 2024-02-20 ENCOUNTER — Other Ambulatory Visit (HOSPITAL_BASED_OUTPATIENT_CLINIC_OR_DEPARTMENT_OTHER): Payer: Self-pay

## 2024-02-20 ENCOUNTER — Other Ambulatory Visit (HOSPITAL_COMMUNITY): Payer: Self-pay

## 2024-02-20 ENCOUNTER — Other Ambulatory Visit: Payer: Self-pay

## 2024-02-20 DIAGNOSIS — F603 Borderline personality disorder: Secondary | ICD-10-CM

## 2024-02-20 DIAGNOSIS — F332 Major depressive disorder, recurrent severe without psychotic features: Secondary | ICD-10-CM

## 2024-02-20 DIAGNOSIS — F411 Generalized anxiety disorder: Secondary | ICD-10-CM

## 2024-02-20 MED ORDER — TRAZODONE HCL 100 MG PO TABS
100.0000 mg | ORAL_TABLET | Freq: Every evening | ORAL | 0 refills | Status: DC | PRN
  Filled 2024-02-20 (×2): qty 90, 45d supply, fill #0

## 2024-02-20 MED ORDER — BUSPIRONE HCL 15 MG PO TABS
30.0000 mg | ORAL_TABLET | Freq: Two times a day (BID) | ORAL | 0 refills | Status: DC
  Filled 2024-02-20 (×2): qty 360, 90d supply, fill #0

## 2024-02-20 MED ORDER — VENLAFAXINE HCL 100 MG PO TABS
100.0000 mg | ORAL_TABLET | Freq: Three times a day (TID) | ORAL | 0 refills | Status: DC
Start: 2024-02-20 — End: 2024-05-21
  Filled 2024-02-23: qty 270, 90d supply, fill #0

## 2024-02-20 MED ORDER — CLONAZEPAM 0.5 MG PO TABS: ORAL_TABLET | ORAL | 5 refills | Status: DC

## 2024-02-23 ENCOUNTER — Other Ambulatory Visit (HOSPITAL_COMMUNITY): Payer: Self-pay

## 2024-02-25 ENCOUNTER — Other Ambulatory Visit: Payer: Self-pay

## 2024-02-26 ENCOUNTER — Other Ambulatory Visit: Payer: Self-pay

## 2024-02-26 ENCOUNTER — Ambulatory Visit (INDEPENDENT_AMBULATORY_CARE_PROVIDER_SITE_OTHER): Admitting: Physician Assistant

## 2024-02-26 VITALS — BP 126/79 | HR 68 | Ht 65.0 in | Wt 330.0 lb

## 2024-02-26 DIAGNOSIS — R309 Painful micturition, unspecified: Secondary | ICD-10-CM | POA: Diagnosis not present

## 2024-02-26 DIAGNOSIS — R0602 Shortness of breath: Secondary | ICD-10-CM

## 2024-02-26 DIAGNOSIS — R300 Dysuria: Secondary | ICD-10-CM | POA: Diagnosis not present

## 2024-02-26 LAB — URINALYSIS, COMPLETE
Bilirubin, UA: NEGATIVE
Glucose, UA: NEGATIVE
Ketones, UA: NEGATIVE
Nitrite, UA: NEGATIVE
Protein,UA: NEGATIVE
RBC, UA: NEGATIVE
Specific Gravity, UA: 1.02 (ref 1.005–1.030)
Urobilinogen, Ur: 0.2 mg/dL (ref 0.2–1.0)
pH, UA: 6 (ref 5.0–7.5)

## 2024-02-26 LAB — MICROSCOPIC EXAMINATION

## 2024-02-26 MED ORDER — AMOXICILLIN 875 MG PO TABS: 875.0000 mg | ORAL_TABLET | Freq: Two times a day (BID) | ORAL | 0 refills | Status: DC

## 2024-02-26 MED ORDER — BREZTRI AEROSPHERE 160-9-4.8 MCG/ACT IN AERO
2.0000 | INHALATION_SPRAY | Freq: Two times a day (BID) | RESPIRATORY_TRACT | 5 refills | Status: AC
Start: 2024-02-26 — End: ?

## 2024-02-26 NOTE — Progress Notes (Signed)
 02/26/2024 11:16 AM   Jamie Bennett 1961-02-14 [id number]  CC: Chief Complaint  Patient presents with   painful urination   HPI: Jamie Bennett is a 63 y.o. female with PMH recurrent UTI previously on suppressive Macrobid  who presents today for evaluation of possible UTI.   Today she reports 2 weeks of intermittent cloudy urine with new dysuria and bilateral flank pain starting yesterday.  She denies fever, chills, nausea, or vomiting.  Notably, she recently started Zepbound  and wonders if this could be related.  She denies any constipation or diarrhea associated with this medication.  In-office UA today positive for trace leukocytes; urine microscopy with 6-10 WBCs/HPF and many bacteria.  PMH: Past Medical History:  Diagnosis Date   Anemia    Anxiety    Bipolar 1 disorder (HCC)    Collagen vascular disease (HCC)    rhematoid arthritis   Constipation    Dyspnea    Fibromyalgia    GERD (gastroesophageal reflux disease)    Heart murmur    mild-asymptomatic   Hepatic steatosis    History of kidney stones    History of Roux-en-Y gastric bypass    Hypotension    Hypothyroidism    Morbid obesity with BMI of 50.0-59.9, adult (HCC)    Opioid abuse (HCC)    Osteoarthritis    Osteoporosis    PONV (postoperative nausea and vomiting)    nausea only   Rheumatic fever    Rheumatoid arthritis (HCC)    Small bowel obstruction (HCC)    Thyroid  disease     Surgical History: Past Surgical History:  Procedure Laterality Date   CATARACT EXTRACTION, BILATERAL  10/2023   CHOLECYSTECTOMY  2004   COLONOSCOPY N/A 07/16/2017   Procedure: COLONOSCOPY;  Surgeon: Viktoria Lamar DASEN, MD;  Location: [facility] ENDOSCOPY;  Service: Endoscopy;  Laterality: N/A;   EXPLORATORY LAPAROTOMY  2009   GASTRIC BYPASS  2003   [facility]   HEEL SPUR EXCISION Bilateral    KNEE ARTHROSCOPY WITH SUBCHONDROPLASTY Left 09/27/2021   Procedure: Left knee subchondroplasty medial tibial plateau;   Surgeon: Kathlynn Sharper, MD;  Location: [facility] ORS;  Service: Orthopedics;  Laterality: Left;   LAPAROTOMY N/A 03/01/2018   Procedure: EXPLORATORY LAPAROTOMY/ UMBILECTOMY;  Surgeon: Dessa Reyes ORN, MD;  Location: [facility] ORS;  Service: General;  Laterality: N/A;   TONSILLECTOMY     VENTRAL HERNIA REPAIR N/A 03/07/2018   Procedure: HERNIA REPAIR VENTRAL ADULT;  Surgeon: Dessa Reyes ORN, MD;  Location: [facility] ORS;  Service: General;  Laterality: N/A;    Home Medications:  Allergies as of 02/26/2024       Reactions   Lactose Intolerance (gi) Other (See Comments)   Bloating and GI distress   Sulfa  Antibiotics Hives        Medication List        Accurate as of February 26, 2024 11:16 AM. If you have any questions, ask your nurse or doctor.          acetaminophen  500 MG tablet Commonly known as: TYLENOL  Take 500-1,000 mg by mouth every 6 (six) hours as needed (pain.).   ascorbic acid 500 MG tablet Commonly known as: VITAMIN C Take 500 mg by mouth in the morning.   Breztri  Aerosphere 160-9-4.8 MCG/ACT Aero inhaler Generic drug: budesonide-glycopyrrolate-formoterol Inhale 2 puffs into the lungs 2 (two) times daily.   busPIRone  15 MG tablet Commonly known as: BUSPAR  Take 2 tablets (30 mg total) by mouth 2 (two) times daily.   CALCIUM 600  PO Take 600 mg by mouth in the morning and at bedtime.   Cholecalciferol  125 MCG (5000 UT) Tabs Take 5,000 Units by mouth in the morning.   clonazePAM  0.5 MG tablet Commonly known as: KLONOPIN  1 bid   Coenzyme Q10 300 MG Caps Take 300 mg by mouth in the morning.   cyanocobalamin  1000 MCG/ML injection Commonly known as: VITAMIN B12 Inject 1,000 mcg into the muscle every 30 (thirty) days.   desipramine  25 MG tablet Commonly known as: Norpramin  Take 1 tablet (25 mg total) by mouth daily.   esomeprazole  20 MG capsule Commonly known as: NexIUM  Take 1 capsule (20 mg total) by mouth daily.   hydroxychloroquine  200 MG tablet Commonly  known as: PLAQUENIL  Take 1 tablet by mouth twice daily   hydrOXYzine  50 MG tablet Commonly known as: ATARAX  Take 1 tablet (50 mg total) by mouth 3 (three) times daily as needed for anxiety.   ipratropium-albuterol  0.5-2.5 (3) MG/3ML Soln Commonly known as: DUONEB Take 3 mLs by nebulization every 4 (four) hours as needed.   Krill Oil 500 MG Caps Take 500 mg by mouth daily at 12 noon.   lamoTRIgine  25 MG tablet Commonly known as: LAMICTAL  2 at bedtime for 2weeks then 1 at bedtime for 1 week then DC   levothyroxine  112 MCG tablet Commonly known as: SYNTHROID  Take 1 tablet (112 mcg total) by mouth daily before breakfast.   loratadine  10 MG tablet Commonly known as: CLARITIN  Take 10 mg by mouth daily as needed for allergies.   magnesium  oxide 400 MG tablet Commonly known as: MAG-OX Take 400 mg by mouth every evening.   metoCLOPramide  5 MG tablet Commonly known as: REGLAN  Take 1 tablet (5 mg total) by mouth at bedtime.   multivitamin tablet Take 1 tablet by mouth every evening.   nystatin  powder Commonly known as: MYCOSTATIN /NYSTOP  APPLY 1 APPLICATION        TOPICALLY TWO TIMES A DAY   polyethylene glycol 17 g packet Commonly known as: MIRALAX  / GLYCOLAX  Take 17 g by mouth in the morning and at bedtime.   prednisoLONE acetate 1 % ophthalmic suspension Commonly known as: PRED FORTE INSTILL ONE DROP INTO THE RIGHT EYE FOUR TIMES DAILY FOR 7 DAYS. THEN INSTILL ONE DROP INTO THE RIGHT EYE TWICE DAILY FOR 7 DAYS. THEN INSTILL ONE DROP INTO THE RIGHT EYE ONCE DAILY FOR 14 DAYS. THEN STOP   Prolia  60 MG/ML Sosy injection Generic drug: denosumab  Inject 1 mL (60 mg total) subcutaneously as directed   Prolia  60 MG/ML Sosy injection Generic drug: denosumab  Inject 1 mL (60 mg total) subcutaneously as directed   psyllium 58.6 % packet Commonly known as: METAMUCIL Take 1 packet by mouth in the morning.   sucralfate  1 g tablet Commonly known as: CARAFATE  Take 1 tablet (1 g  total) by mouth 2 (two) times daily.   tirzepatide  10 MG/0.5ML injection vial Inject 5 mg into the skin.   tiZANidine  4 MG tablet Commonly known as: ZANAFLEX  Take 1 tablet (4 mg total) by mouth 3 (three) times daily.   traZODone  100 MG tablet Commonly known as: DESYREL  Take 1 tablet (100 mg total) by mouth at bedtime as needed for sleep and may repeat dose one time if needed.   venlafaxine  100 MG tablet Commonly known as: EFFEXOR  Take 1 tablet (100 mg total) by mouth 3 (three) times daily with meals.   zinc  gluconate 50 MG tablet Take 50 mg by mouth in the morning.  Allergies:  Allergies  Allergen Reactions   Lactose Intolerance (Gi) Other (See Comments)    Bloating and GI distress   Sulfa  Antibiotics Hives    Family History: Family History  Problem Relation Age of Onset   Depression Mother    Dementia Mother    Heart disease Mother    Osteoporosis Mother    Parkinson's disease Father    Liver disease Brother    Colon cancer Neg Hx     Social History:   reports that she has never smoked. She has been exposed to tobacco smoke. She has never used smokeless tobacco. She reports that she does not currently use alcohol. She reports that she does not use drugs.  Physical Exam: BP 126/79   Pulse 68   Ht 5' 5 (1.651 m)   Wt (!) 330 lb (149.7 kg)   BMI 54.91 kg/m   Constitutional:  Alert and oriented, no acute distress, nontoxic appearing HEENT: [place], AT Cardiovascular: No clubbing, cyanosis, or edema Respiratory: Normal respiratory effort, no increased work of breathing Skin: No rashes, bruises or suspicious lesions Neurologic: Grossly intact, no focal deficits, moving all 4 extremities Psychiatric: Normal mood and affect  Laboratory Data: Results for orders placed or performed in visit on 02/26/24  Microscopic Examination   Collection Time: 02/26/24 11:07 AM   Urine  Result Value Ref Range   WBC, UA 6-10 (A) 0 - 5 /hpf   RBC, Urine 0-2 0 - 2 /hpf    Epithelial Cells (non renal) 0-10 0 - 10 /hpf   Bacteria, UA Many (A) None seen/Few  Urinalysis, Complete   Collection Time: 02/26/24 11:07 AM  Result Value Ref Range   Specific Gravity, UA 1.020 1.005 - 1.030   pH, UA 6.0 5.0 - 7.5   Color, UA Yellow Yellow   Appearance Ur Slightly cloudy Clear   Leukocytes,UA Trace (A) Negative   Protein,UA Negative Negative/Trace   Glucose, UA Negative Negative   Ketones, UA Negative Negative   RBC, UA Negative Negative   Bilirubin, UA Negative Negative   Urobilinogen, Ur 0.2 0.2 - 1.0 mg/dL   Nitrite, UA Negative Negative   Microscopic Examination See below:    *Note: Due to a large number of results and/or encounters for the requested time period, some results have not been displayed. A complete set of results can be found in Results Review.   Assessment & Plan:   1. Dysuria (Primary) UA positive, will start empiric amoxicillin  and send for culture for further evaluation.  If these become more frequent, will consider estrogen cream versus resuming suppressive antibiotics.  I do not think Zepbound  is related to her UTI, especially in the absence of bowel changes. - Urinalysis, Complete - CULTURE, URINE COMPREHENSIVE - amoxicillin  (AMOXIL ) 875 MG tablet; Take 1 tablet (875 mg total) by mouth every 12 (twelve) hours.  Dispense: 14 tablet; Refill: 0   Return if symptoms worsen or fail to improve.  Lucie Hones, PA-C  [facility] [address], [address] [place], [place] [address] [phone]

## 2024-02-28 ENCOUNTER — Other Ambulatory Visit: Payer: Self-pay

## 2024-03-01 LAB — CULTURE, URINE COMPREHENSIVE

## 2024-03-05 ENCOUNTER — Ambulatory Visit: Payer: Self-pay | Admitting: Physician Assistant

## 2024-03-05 DIAGNOSIS — N390 Urinary tract infection, site not specified: Secondary | ICD-10-CM

## 2024-03-06 MED ORDER — NITROFURANTOIN MONOHYD MACRO 100 MG PO CAPS: 100.0000 mg | ORAL_CAPSULE | Freq: Two times a day (BID) | ORAL | 0 refills | Status: AC

## 2024-03-07 ENCOUNTER — Other Ambulatory Visit: Payer: Self-pay | Admitting: Internal Medicine

## 2024-03-07 DIAGNOSIS — K219 Gastro-esophageal reflux disease without esophagitis: Secondary | ICD-10-CM

## 2024-03-07 DIAGNOSIS — K224 Dyskinesia of esophagus: Secondary | ICD-10-CM

## 2024-03-08 ENCOUNTER — Other Ambulatory Visit (HOSPITAL_COMMUNITY): Payer: Self-pay

## 2024-03-10 ENCOUNTER — Other Ambulatory Visit: Payer: Self-pay

## 2024-03-10 NOTE — Telephone Encounter (Signed)
 Please review with Dsk its ok to continue

## 2024-03-11 ENCOUNTER — Other Ambulatory Visit: Payer: Self-pay

## 2024-03-11 ENCOUNTER — Other Ambulatory Visit (HOSPITAL_COMMUNITY): Payer: Self-pay

## 2024-03-11 MED ORDER — METOCLOPRAMIDE HCL 5 MG PO TABS
5.0000 mg | ORAL_TABLET | Freq: Every day | ORAL | 5 refills | Status: AC
  Filled 2024-03-11: qty 90, 90d supply, fill #0
  Filled 2024-06-08: qty 90, 90d supply, fill #1

## 2024-03-14 ENCOUNTER — Inpatient Hospital Stay: Payer: PPO | Attending: Oncology

## 2024-03-14 DIAGNOSIS — E538 Deficiency of other specified B group vitamins: Secondary | ICD-10-CM | POA: Diagnosis not present

## 2024-03-14 MED ORDER — CYANOCOBALAMIN 1000 MCG/ML IJ SOLN
1000.0000 ug | Freq: Once | INTRAMUSCULAR | Status: AC
  Administered 2024-03-14: 1000 ug via INTRAMUSCULAR
  Filled 2024-03-14: qty 1

## 2024-03-19 DIAGNOSIS — M810 Age-related osteoporosis without current pathological fracture: Secondary | ICD-10-CM | POA: Diagnosis not present

## 2024-03-19 DIAGNOSIS — E039 Hypothyroidism, unspecified: Secondary | ICD-10-CM | POA: Diagnosis not present

## 2024-03-20 ENCOUNTER — Ambulatory Visit: Admitting: Physician Assistant

## 2024-03-20 ENCOUNTER — Encounter: Payer: Self-pay | Admitting: Physician Assistant

## 2024-03-20 VITALS — BP 113/75 | HR 74 | Ht 65.0 in | Wt 334.0 lb

## 2024-03-20 DIAGNOSIS — R300 Dysuria: Secondary | ICD-10-CM

## 2024-03-20 DIAGNOSIS — N390 Urinary tract infection, site not specified: Secondary | ICD-10-CM | POA: Diagnosis not present

## 2024-03-20 DIAGNOSIS — N958 Other specified menopausal and perimenopausal disorders: Secondary | ICD-10-CM

## 2024-03-20 MED ORDER — ESTRADIOL 0.1 MG/GM VA CREA: TOPICAL_CREAM | VAGINAL | 12 refills | Status: AC

## 2024-03-21 LAB — URINALYSIS, COMPLETE
Bilirubin, UA: NEGATIVE
Glucose, UA: NEGATIVE
Ketones, UA: NEGATIVE
Leukocytes,UA: NEGATIVE
Nitrite, UA: NEGATIVE
Protein,UA: NEGATIVE
RBC, UA: NEGATIVE
Specific Gravity, UA: 1.025 (ref 1.005–1.030)
Urobilinogen, Ur: 0.2 mg/dL (ref 0.2–1.0)
pH, UA: 6 (ref 5.0–7.5)

## 2024-03-21 LAB — MICROSCOPIC EXAMINATION: Bacteria, UA: NONE SEEN

## 2024-03-22 NOTE — Progress Notes (Signed)
 03/20/2024 11:59 AM   Jamie Bennett 10/21/1960 [id number]  CC: Chief Complaint  Patient presents with   Recurrent UTI   HPI: Jamie Bennett is a 63 y.o. female with PMH recurrent UTI previously on suppressive Macrobid  who presents today for evaluation of possible UTI versus yeast infection.  She is accompanied today by her husband, Tod, who contributes to HPI.  Today she reports burning with urination despite 2 recent courses of antibiotics.  She first took a course of amoxicillin  with some improvement, then was switched to Macrobid  with no change.  The burning might be vulvar, but she is not sure.  She denies vaginal itching, odor, or discharge.  In-office UA and microscopy pan negative.  PMH: Past Medical History:  Diagnosis Date   Anemia    Anxiety    Bipolar 1 disorder (HCC)    Collagen vascular disease (HCC)    rhematoid arthritis   Constipation    Dyspnea    Fibromyalgia    GERD (gastroesophageal reflux disease)    Heart murmur    mild-asymptomatic   Hepatic steatosis    History of kidney stones    History of Roux-en-Y gastric bypass    Hypotension    Hypothyroidism    Morbid obesity with BMI of 50.0-59.9, adult (HCC)    Opioid abuse (HCC)    Osteoarthritis    Osteoporosis    PONV (postoperative nausea and vomiting)    nausea only   Rheumatic fever    Rheumatoid arthritis (HCC)    Small bowel obstruction (HCC)    Thyroid  disease     Surgical History: Past Surgical History:  Procedure Laterality Date   CATARACT EXTRACTION, BILATERAL  10/2023   CHOLECYSTECTOMY  2004   COLONOSCOPY N/A 07/16/2017   Procedure: COLONOSCOPY;  Surgeon: Viktoria Lamar DASEN, MD;  Location: [facility] END[facility];  Service: Endoscopy;  Laterality: N/A;   EXPLORATORY LAPAROTOMY  2009   GASTRIC BYPASS  2003   [facility]   HEEL SPUR EXCISION Bilateral    KNEE ARTHROSCOPY WITH SUBCHONDROPLASTY Left 09/27/2021   Procedure: Left knee subchondroplasty medial tibial  plateau;  Surgeon: Kathlynn Sharper, MD;  Location: [facility] ORS;  Service: Orthopedics;  Laterality: Left;   LAPAROTOMY N/A 03/01/2018   Procedure: EXPLORATORY LAPAROTOMY/ UMBILECTOMY;  Surgeon: Dessa Reyes ORN, MD;  Location: [facility] ORS;  Service: General;  Laterality: N/A;   TONSILLECTOMY     VENTRAL HERNIA REPAIR N/A 03/07/2018   Procedure: HERNIA REPAIR VENTRAL ADULT;  Surgeon: Dessa Reyes ORN, MD;  Location: [facility] ORS;  Service: General;  Laterality: N/A;    Home Medications:  Allergies as of 03/20/2024       Reactions   Lactose Intolerance (gi) Other (See Comments)   Bloating and GI distress   Sulfa  Antibiotics Hives        Medication List        Accurate as of March 20, 2024 11:59 PM. If you have any questions, ask your nurse or doctor.          STOP taking these medications    amoxicillin  875 MG tablet Commonly known as: AMOXIL  Stopped by: Devrin Monforte   ipratropium-albuterol  0.5-2.5 (3) MG/3ML Soln Commonly known as: DUONEB Stopped by: Alka Falwell   prednisoLONE acetate 1 % ophthalmic suspension Commonly known as: PRED FORTE Stopped by: Weslee Fogg       TAKE these medications    acetaminophen  500 MG tablet Commonly known as: TYLENOL  Take 500-1,000 mg by mouth every 6 (six) hours as  needed (pain.).   ascorbic acid 500 MG tablet Commonly known as: VITAMIN C Take 500 mg by mouth in the morning.   Breztri  Aerosphere 160-9-4.8 MCG/ACT Aero inhaler Generic drug: budesonide-glycopyrrolate-formoterol Inhale 2 puffs into the lungs 2 (two) times daily.   busPIRone  15 MG tablet Commonly known as: BUSPAR  Take 2 tablets (30 mg total) by mouth 2 (two) times daily.   CALCIUM 600 PO Take 600 mg by mouth in the morning and at bedtime.   Cholecalciferol  125 MCG (5000 UT) Tabs Take 2,000 Units by mouth in the morning.   clonazePAM  0.5 MG tablet Commonly known as: KLONOPIN  1 bid   Coenzyme Q10 300 MG Caps Take 300 mg by mouth  in the morning.   cyanocobalamin  1000 MCG/ML injection Commonly known as: VITAMIN B12 Inject 1,000 mcg into the muscle every 30 (thirty) days.   desipramine  25 MG tablet Commonly known as: Norpramin  Take 1 tablet (25 mg total) by mouth daily.   esomeprazole  20 MG capsule Commonly known as: NexIUM  Take 1 capsule (20 mg total) by mouth daily.   estradiol  0.1 MG/GM vaginal cream Commonly known as: ESTRACE  Apply one pea-sized amount around the opening of the urethra daily for 2 weeks, then 3 times weekly moving forward. Started by: Danyale Ridinger   hydroxychloroquine  200 MG tablet Commonly known as: PLAQUENIL  Take 1 tablet by mouth twice daily   hydrOXYzine  50 MG tablet Commonly known as: ATARAX  Take 1 tablet (50 mg total) by mouth 3 (three) times daily as needed for anxiety.   Krill Oil 500 MG Caps Take 500 mg by mouth daily at 12 noon.   lamoTRIgine  25 MG tablet Commonly known as: LAMICTAL  2 at bedtime for 2weeks then 1 at bedtime for 1 week then DC   levothyroxine  112 MCG tablet Commonly known as: SYNTHROID  Take 1 tablet (112 mcg total) by mouth daily before breakfast.   loratadine  10 MG tablet Commonly known as: CLARITIN  Take 10 mg by mouth daily as needed for allergies.   magnesium  oxide 400 MG tablet Commonly known as: MAG-OX Take 400 mg by mouth every evening.   metoCLOPramide  5 MG tablet Commonly known as: REGLAN  Take 1 tablet (5 mg total) by mouth at bedtime.   multivitamin tablet Take 1 tablet by mouth every evening.   nystatin  powder Commonly known as: MYCOSTATIN /NYSTOP  APPLY 1 APPLICATION        TOPICALLY TWO TIMES A DAY   polyethylene glycol 17 g packet Commonly known as: MIRALAX  / GLYCOLAX  Take 17 g by mouth in the morning and at bedtime.   Prolia  60 MG/ML Sosy injection Generic drug: denosumab  Inject 1 mL (60 mg total) subcutaneously as directed What changed: Another medication with the same name was removed. Continue taking this  medication, and follow the directions you see here. Changed by: Sephiroth Mcluckie   psyllium 58.6 % packet Commonly known as: METAMUCIL Take 1 packet by mouth in the morning.   sucralfate  1 g tablet Commonly known as: CARAFATE  Take 1 tablet (1 g total) by mouth 2 (two) times daily.   tirzepatide  10 MG/0.5ML injection vial Inject 5 mg into the skin. What changed: how much to take   tiZANidine  4 MG tablet Commonly known as: ZANAFLEX  Take 1 tablet (4 mg total) by mouth 3 (three) times daily.   traZODone  100 MG tablet Commonly known as: DESYREL  Take 1 tablet (100 mg total) by mouth at bedtime as needed for sleep and may repeat dose one time if needed.   venlafaxine   100 MG tablet Commonly known as: EFFEXOR  Take 1 tablet (100 mg total) by mouth 3 (three) times daily with meals.   zinc  gluconate 50 MG tablet Take 50 mg by mouth in the morning.        Allergies:  Allergies  Allergen Reactions   Lactose Intolerance (Gi) Other (See Comments)    Bloating and GI distress   Sulfa  Antibiotics Hives    Family History: Family History  Problem Relation Age of Onset   Depression Mother    Dementia Mother    Heart disease Mother    Osteoporosis Mother    Parkinson's disease Father    Liver disease Brother    Colon cancer Neg Hx     Social History:   reports that she has never smoked. She has been exposed to tobacco smoke. She has never used smokeless tobacco. She reports that she does not currently use alcohol. She reports that she does not use drugs.  Physical Exam: BP 113/75   Pulse 74   Ht 5' 5 (1.651 m)   Wt (!) 334 lb (151.5 kg)   BMI 55.58 kg/m   Constitutional:  Alert and oriented, no acute distress, nontoxic appearing HEENT: [place], AT Cardiovascular: No clubbing, cyanosis, or edema Respiratory: Normal respiratory effort, no increased work of breathing GU: Labial pallor, sparse pubic hair.  Severe clitoral phimosis.  No ulcers or lesions. Skin: No rashes,  bruises or suspicious lesions Neurologic: Grossly intact, no focal deficits, moving all 4 extremities Psychiatric: Normal mood and affect  Laboratory Data: Results for orders placed or performed in visit on 03/20/24  Microscopic Examination   Collection Time: 03/20/24  2:52 PM   Urine  Result Value Ref Range   WBC, UA 0-5 0 - 5 /hpf   RBC, Urine 0-2 0 - 2 /hpf   Epithelial Cells (non renal) 0-10 0 - 10 /hpf   Casts Present (A) None seen /lpf   Cast Type Hyaline casts N/A   Bacteria, UA None seen None seen/Few  Urinalysis, Complete   Collection Time: 03/20/24  2:52 PM  Result Value Ref Range   Specific Gravity, UA 1.025 1.005 - 1.030   pH, UA 6.0 5.0 - 7.5   Color, UA Yellow Yellow   Appearance Ur Hazy (A) Clear   Leukocytes,UA Negative Negative   Protein,UA Negative Negative/Trace   Glucose, UA Negative Negative   Ketones, UA Negative Negative   RBC, UA Negative Negative   Bilirubin, UA Negative Negative   Urobilinogen, Ur 0.2 0.2 - 1.0 mg/dL   Nitrite, UA Negative Negative   Microscopic Examination Comment    Microscopic Examination See below:    *Note: Due to a large number of results and/or encounters for the requested time period, some results have not been displayed. A complete set of results can be found in Results Review.   Assessment & Plan:   1. Dysuria (Primary) UA bland.  Suspect her burning is secondary to #2 below. - Urinalysis, Complete  2. Genitourinary syndrome of menopause Evidence of GSM on pelvic exam today.  I did not do a speculum exam.  I am starting her on topical vaginal estrogen cream.  I showed her where to apply it and how to apply it.  We also discussed using nonhormonal vaginal moisturizers for symptom relief in the interim.  We discussed that she could also apply the cream at the clitoral hood to allow for more mobility there in case she is having irritation. - estradiol  (ESTRACE ) 0.1  MG/GM vaginal cream; Apply one pea-sized amount around the  opening of the urethra daily for 2 weeks, then 3 times weekly moving forward.  Dispense: 42.5 g; Refill: 12   Return if symptoms worsen or fail to improve.  Lucie Hones, PA-C  [facility] [place] [address] [place], [place] [address] [phone]

## 2024-04-05 ENCOUNTER — Other Ambulatory Visit (HOSPITAL_COMMUNITY): Payer: Self-pay

## 2024-04-07 ENCOUNTER — Other Ambulatory Visit: Payer: Self-pay

## 2024-04-08 DIAGNOSIS — Z1231 Encounter for screening mammogram for malignant neoplasm of breast: Secondary | ICD-10-CM | POA: Diagnosis not present

## 2024-04-12 ENCOUNTER — Other Ambulatory Visit: Payer: Self-pay | Admitting: Physician Assistant

## 2024-04-12 DIAGNOSIS — E039 Hypothyroidism, unspecified: Secondary | ICD-10-CM

## 2024-04-14 ENCOUNTER — Inpatient Hospital Stay: Payer: PPO

## 2024-04-14 ENCOUNTER — Other Ambulatory Visit (HOSPITAL_COMMUNITY): Payer: Self-pay

## 2024-04-14 MED ORDER — LEVOTHYROXINE SODIUM 112 MCG PO TABS
112.0000 ug | ORAL_TABLET | Freq: Every day | ORAL | 0 refills | Status: AC
Start: 2024-04-14 — End: ?
  Filled 2024-04-14: qty 90, 90d supply, fill #0

## 2024-04-17 ENCOUNTER — Inpatient Hospital Stay: Attending: Oncology

## 2024-04-17 DIAGNOSIS — E538 Deficiency of other specified B group vitamins: Secondary | ICD-10-CM | POA: Diagnosis not present

## 2024-04-17 MED ORDER — CYANOCOBALAMIN 1000 MCG/ML IJ SOLN
1000.0000 ug | Freq: Once | INTRAMUSCULAR | Status: AC
  Administered 2024-04-17: 1000 ug via INTRAMUSCULAR
  Filled 2024-04-17: qty 1

## 2024-04-21 NOTE — Progress Notes (Signed)
 Office Visit Note  Patient: Jamie Bennett             Date of Birth: [birth year]           MRN: [id number]             PCP: Kristina Tinnie POUR, PA-C Referring: Kristina Tinnie POUR, PA* Visit Date: 05/05/2024 Occupation: Data Unavailable  Subjective:  Right thumb pain and swelling   History of Present Illness: Jamie Bennett is a 63 y.o. female with history of rheumatoid arthritis.  Patient remains on plaquenil  200 mg 1 tablet by mouth twice daily.  She is tolerating Plaquenil  without any side effects and has not had any gaps in therapy.  Patient presents today experiencing a flare involving the right first MCP joint.  Patient states that the flare started 2 weeks ago with no identifiable trigger.  She has been experiencing soreness and swelling despite remaining on Plaquenil .  Patient has intermittent flares involving the right elbow and right knee.  She has been using a cane to assist with ambulation.  She requested paperwork for a handicap placard.  She denies any new medical conditions.  She is on Zepbound  for weight loss.  Activities of Daily Living:  Patient reports morning stiffness for 45-60 minutes.   Patient Denies nocturnal pain.  Difficulty dressing/grooming: Denies Difficulty climbing stairs: Reports Difficulty getting out of chair: Reports Difficulty using hands for taps, buttons, cutlery, and/or writing: Reports  Review of Systems  Constitutional:  Positive for fatigue.  HENT:  Negative for mouth sores and mouth dryness.   Eyes:  Positive for dryness.  Respiratory:  Positive for shortness of breath.   Cardiovascular:  Negative for chest pain and palpitations.  Gastrointestinal:  Negative for blood in stool, constipation and diarrhea.  Endocrine: Negative for increased urination.  Genitourinary:  Negative for involuntary urination.  Musculoskeletal:  Positive for joint pain, joint pain, joint swelling, muscle weakness, morning stiffness and muscle  tenderness. Negative for gait problem, myalgias and myalgias.  Skin:  Negative for color change, rash, hair loss and sensitivity to sunlight.  Allergic/Immunologic: Positive for susceptible to infections.  Neurological:  Positive for dizziness and headaches.  Hematological:  Negative for swollen glands.  Psychiatric/Behavioral:  Positive for depressed mood. Negative for sleep disturbance. The patient is not nervous/anxious.     PMFS History:  Patient Active Problem List   Diagnosis Date Noted   Moderate persistent asthma with acute exacerbation 08/03/2022   Chronic knee pain (1ry area of Pain) (Left) 04/19/2022   Tricompartment osteoarthritis of knee (Left) 04/19/2022   Primary osteoarthritis of knee (Left) 04/19/2022   Chronic anticoagulation (Plaquenil ) 04/19/2022   History of attempted suicide (07/10/2016) 04/18/2022   History of drug overdose (11/08/2015) 04/18/2022   Chronic pain syndrome 04/17/2022   Pharmacologic therapy 04/17/2022   Disorder of skeletal system 04/17/2022   Problems influencing health status 04/17/2022   Neck pain, musculoskeletal 03/24/2020   Bipolar affective disorder, current episode mixed (HCC) 11/09/2019   Encounter for general adult medical examination with abnormal findings 06/15/2019   Closed fracture of elbow (Left) 06/15/2019   Urinary tract infection without hematuria 06/15/2019   Encounter for long-term (current) use of medications 06/15/2019   Acute upper respiratory infection 06/06/2019   Exposure to COVID-19 virus 06/06/2019   BMI 50.0-59.9, adult (HCC) 06/03/2019   Exercise-induced asthma 12/15/2018   Screening for breast cancer 05/29/2018   Duodenal ulcer disease 05/29/2018   Screening for malignant neoplasm of cervix 05/29/2018  Dysuria 05/29/2018   Surgical wound dehiscence, initial encounter 04/14/2018   Postoperative abdominal hernia with obstruction    Incarcerated ventral hernia 03/22/2018   SBO (small bowel obstruction) (HCC)  03/07/2018   Persistent umbilical sinus 02/22/2018   Atopic dermatitis 12/05/2017   Adjustment disorder with mixed anxiety and depressed mood 03/10/2017   Major depressive disorder 03/10/2017   Primary osteoarthritis of knees (Bilateral) 01/19/2017   Suicide attempt (HCC) 07/10/2016   Fibromyalgia 07/10/2016   High risk medication use 07/10/2016   Hypotension 11/09/2015   Respiratory failure (HCC)    Drug overdose (11/08/2015) (undetermined intent) 11/08/2015   Fatty infiltration of liver 02/16/2015   Hepatic fibrosis 02/16/2015   Iron  deficiency anemia 02/05/2015   Vitamin B 12 deficiency 02/05/2015   OP (osteoporosis) 06/16/2014   Rheumatoid arteritis (HCC) 03/02/2014   Hypothyroidism 03/02/2014   Rheumatic fever without heart involvement 03/02/2014   Adult hypothyroidism 03/02/2014   Arthritis of pelvic region, degenerative 03/02/2014   History of bariatric surgery 11/24/2013    Class: History of   Bipolar affective disorder (HCC) 11/24/2013   Rheumatoid arthritis (HCC) 11/24/2013   Polysubstance (excluding opioids) dependence (HCC) 09/11/2013   Polysubstance dependence (HCC) 09/11/2013   Combined drug dependence excluding opioids (HCC) 09/11/2013   Arthritis or polyarthritis, rheumatoid (HCC) 09/05/2013   Bipolar 1 disorder, depressed (HCC) 09/05/2013    Past Medical History:  Diagnosis Date   Anemia    Anxiety    Bipolar 1 disorder (HCC)    Collagen vascular disease    rhematoid arthritis   Constipation    Dyspnea    Fibromyalgia    GERD (gastroesophageal reflux disease)    Heart murmur    mild-asymptomatic   Hepatic steatosis    History of kidney stones    History of Roux-en-Y gastric bypass    Hypotension    Hypothyroidism    Morbid obesity with BMI of 50.0-59.9, adult (HCC)    Opioid abuse (HCC)    Osteoarthritis    Osteoporosis    PONV (postoperative nausea and vomiting)    nausea only   Rheumatic fever    Rheumatoid arthritis (HCC)    Small bowel  obstruction (HCC)    Thyroid  disease     Family History  Problem Relation Age of Onset   Depression Mother    Dementia Mother    Heart disease Mother    Osteoporosis Mother    Parkinson's disease Father    Liver disease Brother    Colon cancer Neg Hx    Past Surgical History:  Procedure Laterality Date   CATARACT EXTRACTION, BILATERAL  10/2023   CHOLECYSTECTOMY  2004   COLONOSCOPY N/A 07/16/2017   Procedure: COLONOSCOPY;  Surgeon: Viktoria Lamar DASEN, MD;  Location: [facility] ENDOSCOPY;  Service: Endoscopy;  Laterality: N/A;   EXPLORATORY LAPAROTOMY  2009   GASTRIC BYPASS  2003   [facility]   HEEL SPUR EXCISION Bilateral    KNEE ARTHROSCOPY WITH SUBCHONDROPLASTY Left 09/27/2021   Procedure: Left knee subchondroplasty medial tibial plateau;  Surgeon: Kathlynn Sharper, MD;  Location: [facility] ORS;  Service: Orthopedics;  Laterality: Left;   LAPAROTOMY N/A 03/01/2018   Procedure: EXPLORATORY LAPAROTOMY/ UMBILECTOMY;  Surgeon: Dessa Reyes ORN, MD;  Location: [facility] ORS;  Service: General;  Laterality: N/A;   TONSILLECTOMY     VENTRAL HERNIA REPAIR N/A 03/07/2018   Procedure: HERNIA REPAIR VENTRAL ADULT;  Surgeon: Dessa Reyes ORN, MD;  Location: [facility] ORS;  Service: General;  Laterality: N/A;   Social History  Tobacco Use   Smoking status: Never    Passive exposure: Past   Smokeless tobacco: Never  Vaping Use   Vaping status: Never Used  Substance Use Topics   Alcohol use: Not Currently   Drug use: No   Social History   Social History Narrative   Not on file     Immunization History  Administered Date(s) Administered   Influenza Inj Mdck Quad Pf 06/09/2022   Influenza, Quadrivalent, Recombinant, Inj, Pf 04/26/2019   Influenza,inj,Quad PF,6+ Mos 04/24/2016   Influenza-Unspecified 04/19/2021   Moderna SARS-COV2 Booster Vaccination 01/30/2021, 05/13/2021   Moderna Sars-Covid-2 Vaccination 10/25/2019, 11/22/2019, 06/02/2020   Pneumococcal Polysaccharide-23 03/02/2018    Zoster Recombinant(Shingrix) 10/12/2021, 12/19/2021     Objective: Vital Signs: BP 116/75   Pulse 75   Temp (!) 97.5 F (36.4 C)   Resp 14   Ht 5' 5 (1.651 m)   Wt (!) 325 lb (147.4 kg)   BMI 54.08 kg/m    Physical Exam Vitals and nursing note reviewed.  Constitutional:      Appearance: She is well-developed.  HENT:     Head: Normocephalic and atraumatic.  Eyes:     Conjunctiva/sclera: Conjunctivae normal.  Cardiovascular:     Rate and Rhythm: Normal rate and regular rhythm.     Heart sounds: Normal heart sounds.  Pulmonary:     Effort: Pulmonary effort is normal.     Breath sounds: Normal breath sounds.  Abdominal:     General: Bowel sounds are normal.     Palpations: Abdomen is soft.  Musculoskeletal:     Cervical back: Normal range of motion.  Lymphadenopathy:     Cervical: No cervical adenopathy.  Skin:    General: Skin is warm and dry.     Capillary Refill: Capillary refill takes less than 2 seconds.  Neurological:     Mental Status: She is alert and oriented to person, place, and time.  Psychiatric:        Behavior: Behavior normal.         Musculoskeletal Exam: Patient remain seated during examination today.  C-spine has good range of motion.  Postural thoracic opposes noted.  Shoulder joints have good range of motion.  Elbow joints have good range of motion with some tenderness along the right elbow joint line.  Wrist joints have good range of motion with no tenderness or synovitis.  Tenderness and synovitis of the right first MCP joint--photo attached above. Painful range of motion of the right knee with mild warmth.  Crepitus with range of motion of the left knee.  CDAI Exam: CDAI Score: -- Patient Global: --; Provider Global: -- Swollen: 1 ; Tender: 1  Joint Exam 05/05/2024      Right  Left  MCP 1  Swollen Tender        Investigation: No additional findings.  Imaging: No results found.  Recent Labs: Lab Results  Component Value Date    WBC 5.6 02/13/2024   HGB 12.6 02/13/2024   PLT 179 02/13/2024   NA 142 12/04/2023   K 3.7 12/04/2023   CL 109 12/04/2023   CO2 23 12/04/2023   GLUCOSE 82 12/04/2023   BUN 28 (H) 12/04/2023   CREATININE 0.71 12/04/2023   BILITOT 0.4 12/04/2023   ALKPHOS 105 01/10/2022   AST 20 12/04/2023   ALT 13 12/04/2023   PROT 6.4 12/04/2023   ALBUMIN 3.9 01/10/2022   CALCIUM 9.6 12/04/2023   GFRAA 99 05/26/2020    Speciality Comments: PLQ  Eye Exam: 06/06/2023 WNL @ [facility] Follow up 1 year   Procedures:  No procedures performed Allergies: Lactose intolerance (gi) and Sulfa  antibiotics    Assessment / Plan:     Visit Diagnoses: Rheumatoid arthritis involving multiple sites with positive rheumatoid factor [facility]): Patient presents today experiencing a flare involving the right thumb.  She has tenderness and synovitis of the right first MCP joint x 2 weeks.  No identifiable trigger.  She has remained on Plaquenil  200 mg 1 tablet by mouth twice daily.  She is tolerating Plaquenil  without any side effects and has not had any gaps in therapy.  A photo was attached above revealing the inflammation.  She experiences intermittent discomfort in the right elbow and right knee.  She has been using a cane to assist with ambulation.  She has difficulty ambulating prolonged distances due to the discomfort in both knees.  A temporary handicap placard form was provided to the patient today as requested.   Different treatment options were discussed.  She requested a prednisone  taper to send to the pharmacy today.  Prednisone  taper starting at 20 mg tapering by 5 mg every 2 days were sent to the pharmacy.  Advised patient to take prednisone  in the morning with food and avoid the use of NSAIDs.  She will notify us  if her symptoms persist or worsen.  She remain on Plaquenil  as monotherapy.  We discussed other treatment options but she would like to hold off on making any medication changes at this time.  She  will follow-up in the office in 5 months or sooner if needed.  High risk medication use - Plaquenil  200 mg 1 tablet by mouth twice daily.   CBC updated on 02/13/24.   Lipid panel updated on 12/04/23.  PLQ Eye Exam: 06/06/2023 WNL @ [facility] Follow up 1 year.  Patient was given a new Plaquenil  examination form to take with her to her next appointment. - Plan: CBC with Differential/Platelet, Comprehensive metabolic panel with GFR  Primary osteoarthritis of both hands: Patient presents today experiencing a flare involving the right thumb x 2 weeks.  She has tenderness and synovitis of the right first MCP joint.  Different treatment options were discussed.  Patient requested a prednisone  taper to be sent to pharmacy today.  Prednisone  taper starting 20 mg tapering by 5 mg every 2 days were sent to the pharmacy.  Instructions were provided.  She is vies notify us  if the symptoms persist or worsen.  Trigger little finger of left hand - Trigger finger injection Dec 04, 2023.  She good response to injection.  No recurrence.  Primary osteoarthritis of both knees: Chronic pain.  She is considering proceeding with a knee replacement in the future.  She is currently on Zepbound  for weight loss.  She is been using a cane to assist with ambulation.  At times has difficulty walking prolonged distances due to severity of pain in both knees.  A temporary handicap lacquer was provided to the patient.  DDD (degenerative disc disease), cervical: C-spine has good ROM.  No symptoms of radiculopathy.   DDD (degenerative disc disease), thoracic: Thoracic kyphosis noted.  Degeneration of intervertebral disc of lumbar region without discogenic back pain or lower extremity pain: Intermittent discomfort.  She takes tizanidine  4 mg up to 3 times daily as needed for muscle spasms.  Other osteoporosis without current pathological fracture - On Prolia  60 mg sq injections every 6 months by her endocrinologist.  Trapezius  muscle spasm: She takes tizanidine  4 mg up to 3 times daily as needed for muscle spasms.  Fibromyalgia: She has intermittent myalgias and muscle tenderness due to fibromyalgia.  She takes tizanidine  4 mg up to 3 times daily as needed for muscle spasms.  Other medical conditions are listed as follows:   History of anemia  Dyslipidemia  History of gastroesophageal reflux (GERD)  History of bipolar disorder  History of hypothyroidism  Orders: Orders Placed This Encounter  Procedures   CBC with Differential/Platelet   Comprehensive metabolic panel with GFR   Meds ordered this encounter  Medications   predniSONE  (DELTASONE ) 5 MG tablet    Sig: Take 4 tabs po x 2 days, 3  tabs po x 2 days, 2  tabs po x 2 days, 1  tab po x 2 days    Dispense:  20 tablet    Refill:  0    Follow-Up Instructions: Return in about 5 months (around 10/03/2024) for Rheumatoid arthritis.   Waddell CHRISTELLA Craze, PA-C  Note - This record has been created using Dragon software.  Chart creation errors have been sought, but may not always  have been located. Such creation errors do not reflect on  the standard of medical care.

## 2024-05-05 ENCOUNTER — Ambulatory Visit: Attending: Physician Assistant | Admitting: Physician Assistant

## 2024-05-05 ENCOUNTER — Encounter: Payer: Self-pay | Admitting: Physician Assistant

## 2024-05-05 VITALS — BP 116/75 | HR 75 | Temp 97.5°F | Resp 14 | Ht 65.0 in | Wt 325.0 lb

## 2024-05-05 DIAGNOSIS — M797 Fibromyalgia: Secondary | ICD-10-CM

## 2024-05-05 DIAGNOSIS — Z79899 Other long term (current) drug therapy: Secondary | ICD-10-CM

## 2024-05-05 DIAGNOSIS — M5030 Other cervical disc degeneration, unspecified cervical region: Secondary | ICD-10-CM | POA: Diagnosis not present

## 2024-05-05 DIAGNOSIS — M51369 Other intervertebral disc degeneration, lumbar region without mention of lumbar back pain or lower extremity pain: Secondary | ICD-10-CM

## 2024-05-05 DIAGNOSIS — M170 Bilateral primary osteoarthritis of knee: Secondary | ICD-10-CM | POA: Diagnosis not present

## 2024-05-05 DIAGNOSIS — M5134 Other intervertebral disc degeneration, thoracic region: Secondary | ICD-10-CM

## 2024-05-05 DIAGNOSIS — M65352 Trigger finger, left little finger: Secondary | ICD-10-CM

## 2024-05-05 DIAGNOSIS — Z8719 Personal history of other diseases of the digestive system: Secondary | ICD-10-CM

## 2024-05-05 DIAGNOSIS — M818 Other osteoporosis without current pathological fracture: Secondary | ICD-10-CM | POA: Diagnosis not present

## 2024-05-05 DIAGNOSIS — M0579 Rheumatoid arthritis with rheumatoid factor of multiple sites without organ or systems involvement: Secondary | ICD-10-CM | POA: Diagnosis not present

## 2024-05-05 DIAGNOSIS — M62838 Other muscle spasm: Secondary | ICD-10-CM | POA: Diagnosis not present

## 2024-05-05 DIAGNOSIS — Z8659 Personal history of other mental and behavioral disorders: Secondary | ICD-10-CM

## 2024-05-05 DIAGNOSIS — Z862 Personal history of diseases of the blood and blood-forming organs and certain disorders involving the immune mechanism: Secondary | ICD-10-CM

## 2024-05-05 DIAGNOSIS — Z8639 Personal history of other endocrine, nutritional and metabolic disease: Secondary | ICD-10-CM

## 2024-05-05 DIAGNOSIS — M19042 Primary osteoarthritis, left hand: Secondary | ICD-10-CM

## 2024-05-05 DIAGNOSIS — M19041 Primary osteoarthritis, right hand: Secondary | ICD-10-CM

## 2024-05-05 DIAGNOSIS — E785 Hyperlipidemia, unspecified: Secondary | ICD-10-CM

## 2024-05-05 MED ORDER — PREDNISONE 5 MG PO TABS: ORAL_TABLET | ORAL | 0 refills | Status: DC

## 2024-05-07 DIAGNOSIS — Z79899 Other long term (current) drug therapy: Secondary | ICD-10-CM | POA: Diagnosis not present

## 2024-05-07 LAB — COMPREHENSIVE METABOLIC PANEL WITH GFR
AG Ratio: 1.8 (calc) (ref 1.0–2.5)
ALT: 62 U/L — ABNORMAL HIGH (ref 6–29)
AST: 28 U/L (ref 10–35)
Albumin: 4.3 g/dL (ref 3.6–5.1)
Alkaline phosphatase (APISO): 161 U/L — ABNORMAL HIGH (ref 37–153)
BUN: 23 mg/dL (ref 7–25)
CO2: 24 mmol/L (ref 20–32)
Calcium: 9.8 mg/dL (ref 8.6–10.4)
Chloride: 107 mmol/L (ref 98–110)
Creat: 0.77 mg/dL (ref 0.50–1.05)
Globulin: 2.4 g/dL (ref 1.9–3.7)
Glucose, Bld: 98 mg/dL (ref 65–99)
Potassium: 3.7 mmol/L (ref 3.5–5.3)
Sodium: 141 mmol/L (ref 135–146)
Total Bilirubin: 0.4 mg/dL (ref 0.2–1.2)
Total Protein: 6.7 g/dL (ref 6.1–8.1)
eGFR: 87 mL/min/1.73m2 (ref >=60)

## 2024-05-07 LAB — CBC WITH DIFFERENTIAL/PLATELET
Absolute Lymphocytes: 1495 {cells}/uL (ref 850–3900)
Absolute Monocytes: 622 {cells}/uL (ref 200–950)
Basophils Absolute: 42 {cells}/uL (ref 0–200)
Basophils Relative: 0.5 %
Eosinophils Absolute: 168 {cells}/uL (ref 15–500)
Eosinophils Relative: 2 %
HCT: 41.3 % (ref 35.0–45.0)
Hemoglobin: 13.5 g/dL (ref 11.7–15.5)
MCH: 30.6 pg (ref 27.0–33.0)
MCHC: 32.7 g/dL (ref 32.0–36.0)
MCV: 93.7 fL (ref 80.0–100.0)
MPV: 9.9 fL (ref 7.5–12.5)
Monocytes Relative: 7.4 %
Neutro Abs: 6073 {cells}/uL (ref 1500–7800)
Neutrophils Relative %: 72.3 %
Platelets: 191 Thousand/uL (ref 140–400)
RBC: 4.41 Million/uL (ref 3.80–5.10)
RDW: 12.1 % (ref 11.0–15.0)
Total Lymphocyte: 17.8 %
WBC: 8.4 Thousand/uL (ref 3.8–10.8)

## 2024-05-08 ENCOUNTER — Ambulatory Visit: Payer: Self-pay | Admitting: Physician Assistant

## 2024-05-08 DIAGNOSIS — Z79899 Other long term (current) drug therapy: Secondary | ICD-10-CM

## 2024-05-08 NOTE — Progress Notes (Signed)
 CBC WNL Alk phos is elevated-161.  ALT is elevated-62. Please clarify if she has been taking tylenol ? Alcohol use? Any other medication changes?

## 2024-05-08 NOTE — Progress Notes (Signed)
 Please encourage the patient to limit tylenol  use.  Recheck hepatic function panel in 2-3 weeks.

## 2024-05-11 ENCOUNTER — Other Ambulatory Visit: Payer: Self-pay | Admitting: Physician Assistant

## 2024-05-11 DIAGNOSIS — M0579 Rheumatoid arthritis with rheumatoid factor of multiple sites without organ or systems involvement: Secondary | ICD-10-CM

## 2024-05-12 NOTE — Telephone Encounter (Signed)
 Last Fill: 02/11/2024  Eye exam: 06/06/2023   Labs: 05/07/2024 CBC WNL Alk phos is elevated-161.  ALT is elevated-62.  Patient states she has been taking Tylenol  1300 mg po twice daily. Patient denies alcohol use and any medication changes   Please encourage the patient to limit tylenol  use. Recheck hepatic function panel in 2-3 weeks.   Next Visit: 10/15/2024  Last Visit: 05/05/2024  DX: Rheumatoid arthritis involving multiple sites with positive rheumatoid factor   Current Dose per office note on 05/05/2024: Plaquenil  200 mg 1 tablet by mouth twice daily.   Okay to refill Plaquenil ?

## 2024-05-13 ENCOUNTER — Ambulatory Visit: Payer: Self-pay | Admitting: Internal Medicine

## 2024-05-14 ENCOUNTER — Inpatient Hospital Stay: Payer: PPO | Attending: Oncology

## 2024-05-14 DIAGNOSIS — E538 Deficiency of other specified B group vitamins: Secondary | ICD-10-CM | POA: Diagnosis not present

## 2024-05-14 MED ORDER — CYANOCOBALAMIN 1000 MCG/ML IJ SOLN
1000.0000 ug | Freq: Once | INTRAMUSCULAR | Status: AC
  Administered 2024-05-14: 1000 ug via INTRAMUSCULAR
  Filled 2024-05-14: qty 1

## 2024-05-21 ENCOUNTER — Other Ambulatory Visit (HOSPITAL_COMMUNITY): Payer: Self-pay

## 2024-05-21 ENCOUNTER — Encounter (HOSPITAL_COMMUNITY): Payer: Self-pay | Admitting: Psychiatry

## 2024-05-21 ENCOUNTER — Other Ambulatory Visit (HOSPITAL_COMMUNITY): Payer: Self-pay | Admitting: Psychiatry

## 2024-05-21 ENCOUNTER — Other Ambulatory Visit (HOSPITAL_BASED_OUTPATIENT_CLINIC_OR_DEPARTMENT_OTHER): Payer: Self-pay

## 2024-05-21 ENCOUNTER — Other Ambulatory Visit: Payer: Self-pay

## 2024-05-21 ENCOUNTER — Ambulatory Visit (HOSPITAL_COMMUNITY): Admitting: Psychiatry

## 2024-05-21 VITALS — BP 148/85 | HR 76 | Ht 65.0 in | Wt 321.0 lb

## 2024-05-21 DIAGNOSIS — F411 Generalized anxiety disorder: Secondary | ICD-10-CM

## 2024-05-21 DIAGNOSIS — F325 Major depressive disorder, single episode, in full remission: Secondary | ICD-10-CM

## 2024-05-21 DIAGNOSIS — F603 Borderline personality disorder: Secondary | ICD-10-CM | POA: Diagnosis not present

## 2024-05-21 DIAGNOSIS — F332 Major depressive disorder, recurrent severe without psychotic features: Secondary | ICD-10-CM

## 2024-05-21 MED ORDER — VENLAFAXINE HCL 100 MG PO TABS
100.0000 mg | ORAL_TABLET | Freq: Three times a day (TID) | ORAL | 0 refills | Status: DC
  Filled 2024-05-21 – 2024-05-22 (×2): qty 270, 90d supply, fill #0

## 2024-05-21 MED ORDER — BUSPIRONE HCL 15 MG PO TABS: 30.0000 mg | ORAL_TABLET | Freq: Two times a day (BID) | ORAL | 2 refills | Status: AC

## 2024-05-21 MED ORDER — CLONAZEPAM 0.5 MG PO TABS: ORAL_TABLET | ORAL | 5 refills | Status: AC

## 2024-05-21 MED ORDER — HYDROXYZINE HCL 50 MG PO TABS
50.0000 mg | ORAL_TABLET | Freq: Three times a day (TID) | ORAL | 1 refills | Status: AC | PRN
  Filled 2024-05-21 – 2024-05-22 (×2): qty 270, 90d supply, fill #0

## 2024-05-21 MED ORDER — DESIPRAMINE HCL 25 MG PO TABS: 25.0000 mg | ORAL_TABLET | Freq: Every day | ORAL | 4 refills | Status: AC

## 2024-05-21 NOTE — Progress Notes (Signed)
 `  Psychiatric Initial Adult Assessment   Patient Identification: Jamie Bennett MRN:  [id number] Date of Evaluation:  05/21/2024 Referral Source: grams per previous psychiatrist Chief Complaint:    Today the patient is seen alone in the office.  She is doing wonderfully well.  She has lost 42 pounds and she feels great.  She takes Zepbound .  Her mood is great.  She is sleeping and eating fairly well.  Her energy level is a little low.  She has no problems thinking or concentrating.  She is a good sense of worth that she is not suicidal.  She loves reading sewing and doing crafts and watching TV.  She clearly is enjoying her life.  Financially she is stable.  She is getting along very well with her husband.  She herself per our direction discontinued her Lamictal .  She feels no different without it.  Patient lives at home with her husband owns their own home and likes where she lives.  Patient is doing very well.  Hopefully as she loses more weight she will potentially can have her knees replaced.   Visit Diagnosis: borderline personality disorder   ICD-10-CM   1. Anxiety state  F41.1 venlafaxine  (EFFEXOR ) 100 MG tablet    hydrOXYzine  (ATARAX ) 50 MG tablet    clonazePAM  (KLONOPIN ) 0.5 MG tablet    busPIRone  (BUSPAR ) 15 MG tablet    2. Borderline personality disorder (HCC)  F60.3 venlafaxine  (EFFEXOR ) 100 MG tablet    3. Major depressive disorder, recurrent, severe without psychotic features (HCC)  F33.2 venlafaxine  (EFFEXOR ) 100 MG tablet       History of Pre Depression Symptoms:  fatigue, (Hypo) Manic Symptoms:   Anxiety Symptoms:   Psychotic Symptoms:   PTSD Symptoms:   Past Psychiatric History: 10 psychiatric hospitalizations multiple psychotropic medications presently in psychotherapy  Previous Psychotropic Medications: Yes   Substance Abuse History in the last 12 months:  Yes.    Consequences of Substance Abuse:   Past Medical History:  Past Medical History:   Diagnosis Date   Anemia    Anxiety    Bipolar 1 disorder (HCC)    Collagen vascular disease    rhematoid arthritis   Constipation    Dyspnea    Fibromyalgia    GERD (gastroesophageal reflux disease)    Heart murmur    mild-asymptomatic   Hepatic steatosis    History of kidney stones    History of Roux-en-Y gastric bypass    Hypotension    Hypothyroidism    Morbid obesity with BMI of 50.0-59.9, adult (HCC)    Opioid abuse (HCC)    Osteoarthritis    Osteoporosis    PONV (postoperative nausea and vomiting)    nausea only   Rheumatic fever    Rheumatoid arthritis (HCC)    Small bowel obstruction (HCC)    Thyroid  disease     Past Surgical History:  Procedure Laterality Date   CATARACT EXTRACTION, BILATERAL  10/2023   CHOLECYSTECTOMY  2004   COLONOSCOPY N/A 07/16/2017   Procedure: COLONOSCOPY;  Surgeon: Viktoria Lamar DASEN, MD;  Location: [facility];  Service: Endoscopy;  Laterality: N/A;   EXPLORATORY LAPAROTOMY  2009   GASTRIC BYPASS  2003   [facility]   HEEL SPUR EXCISION Bilateral    KNEE ARTHROSCOPY WITH SUBCHONDROPLASTY Left 09/27/2021   Procedure: Left knee subchondroplasty medial tibial plateau;  Surgeon: Kathlynn Sharper, MD;  Location: [facility];  Service: Orthopedics;  Laterality: Left;   LAPAROTOMY N/A 03/01/2018   Procedure:  EXPLORATORY LAPAROTOMY/ UMBILECTOMY;  Surgeon: Dessa Reyes ORN, MD;  Location: [facility];  Service: General;  Laterality: N/A;   TONSILLECTOMY     VENTRAL HERNIA REPAIR N/A 03/07/2018   Procedure: HERNIA REPAIR VENTRAL ADULT;  Surgeon: Dessa Reyes ORN, MD;  Location: [facility];  Service: General;  Laterality: N/A;    Family Psychiatric History:   Family History:  Family History  Problem Relation Age of Onset   Depression Mother    Dementia Mother    Heart disease Mother    Osteoporosis Mother    Parkinson's disease Father    Liver disease Brother    Colon cancer Neg Hx     Social History:   Social History    Socioeconomic History   Marital status: Married    Spouse name: Not on file   Number of children: Not on file   Years of education: Not on file   Highest education level: Not on file  Occupational History   Not on file  Tobacco Use   Smoking status: Never    Passive exposure: Past   Smokeless tobacco: Never  Vaping Use   Vaping status: Never Used  Substance and Sexual Activity   Alcohol use: Not Currently   Drug use: No   Sexual activity: Yes  Other Topics Concern   Not on file  Social History Narrative   Not on file   Social Drivers of Health   Financial Resource Strain: Low Risk  (12/13/2022)   Received from [facility]   Overall Financial Resource Strain (CARDIA)    Difficulty of Paying Living Expenses: Not hard at all  Food Insecurity: No Food Insecurity (12/13/2022)   Received from [facility]   Hunger Vital Sign    Within the past 12 months, you worried that your food would run out before you got the money to buy more.: Never true    Within the past 12 months, the food you bought just didn't last and you didn't have money to get more.: Never true  Transportation Needs: No Transportation Needs (12/13/2022)   Received from [facility] - Transportation    In the past 12 months, has lack of transportation kept you from medical appointments or from getting medications?: No    Lack of Transportation (Non-Medical): No  Physical Activity: Insufficiently Active (12/13/2022)   Received from [facility]   Exercise Vital Sign    On average, how many days per week do you engage in moderate to strenuous exercise (like a brisk walk)?: 1 day    On average, how many minutes do you engage in exercise at this level?: 10 min  Stress: Stress Concern Present (12/13/2022)   Received from [facility] - Occupational Stress Questionnaire    Feeling  of Stress : To some extent  Social Connections: Socially Isolated (12/13/2022)   Received from [facility]   Social Connection and Isolation Panel    In a typical week, how many times do you talk on the phone with family, friends, or neighbors?: Never    How often do you get together with friends or relatives?: Never    How often do you attend church or religious services?: Never    Do you belong to any clubs or organizations such as church groups, unions, fraternal or athletic groups, or school groups?: No    How often do you  attend meetings of the clubs or organizations you belong to?: Never    Are you married, widowed, divorced, separated, never married, or living with a partner?: Married    Additional Social History:   Allergies:   Allergies  Allergen Reactions   Lactose Intolerance (Gi) Other (See Comments)    Bloating and GI distress   Sulfa  Antibiotics Hives    Metabolic Disorder Labs: Lab Results  Component Value Date   HGBA1C 5.0 09/01/2021   No results found for: PROLACTIN Lab Results  Component Value Date   CHOL 154 12/04/2023   TRIG 98 12/04/2023   HDL 62 12/04/2023   CHOLHDL 2.5 12/04/2023   LDLCALC 74 12/04/2023   LDLCALC 73 05/26/2020     Current Medications: Current Outpatient Medications  Medication Sig Dispense Refill   acetaminophen  (TYLENOL ) 500 MG tablet Take 500-1,000 mg by mouth every 6 (six) hours as needed (pain.).     budesonide-glycopyrrolate-formoterol (BREZTRI  AEROSPHERE) 160-9-4.8 MCG/ACT AERO inhaler Inhale 2 puffs into the lungs 2 (two) times daily. 10.7 g 5   Calcium Carbonate (CALCIUM 600 PO) Take 600 mg by mouth in the morning and at bedtime.     Cholecalciferol  125 MCG (5000 UT) TABS Take 2,000 Units by mouth in the morning.     Coenzyme Q10 300 MG CAPS Take 300 mg by mouth in the morning.     cyanocobalamin  (,VITAMIN B-12,) 1000 MCG/ML injection Inject 1,000 mcg into the muscle every 30 (thirty) days.       denosumab  (PROLIA ) 60 MG/ML SOSY injection Inject 1 mL (60 mg total) subcutaneously as directed 1 mL 0   esomeprazole  (NEXIUM ) 20 MG capsule Take 1 capsule (20 mg total) by mouth daily. 90 capsule 1   estradiol  (ESTRACE ) 0.1 MG/GM vaginal cream Apply one pea-sized amount around the opening of the urethra daily for 2 weeks, then 3 times weekly moving forward. 42.5 g 12   hydroxychloroquine  (PLAQUENIL ) 200 MG tablet Take 1 tablet by mouth twice daily 180 tablet 0   Krill Oil 500 MG CAPS Take 500 mg by mouth daily at 12 noon.     levothyroxine  (SYNTHROID ) 112 MCG tablet Take 1 tablet (112 mcg total) by mouth daily before breakfast. 90 tablet 0   loratadine  (CLARITIN ) 10 MG tablet Take 10 mg by mouth daily as needed for allergies.     magnesium  oxide (MAG-OX) 400 MG tablet Take 400 mg by mouth every evening.     metoCLOPramide  (REGLAN ) 5 MG tablet Take 1 tablet (5 mg total) by mouth at bedtime. 90 tablet 5   Multiple Vitamin (MULTIVITAMIN) tablet Take 1 tablet by mouth every evening.     nystatin  (MYCOSTATIN /NYSTOP ) powder APPLY 1 APPLICATION        TOPICALLY TWO TIMES A DAY 30 g 6   polyethylene glycol (MIRALAX  / GLYCOLAX ) 17 g packet Take 17 g by mouth in the morning and at bedtime.     psyllium (METAMUCIL) 58.6 % packet Take 1 packet by mouth in the morning.     sucralfate  (CARAFATE ) 1 g tablet Take 1 tablet (1 g total) by mouth 2 (two) times daily. 180 tablet 1   tirzepatide  10 MG/0.5ML injection vial Inject 5 mg into the skin.     Tirzepatide -Weight Management 15 MG/0.5ML SOLN Inject 15 mg into the skin. (Patient taking differently: Inject 7.5 mg into the skin.)     tiZANidine  (ZANAFLEX ) 4 MG tablet Take 1 tablet (4 mg total) by mouth 3 (three) times daily. 270 tablet 1  vitamin C (ASCORBIC ACID) 500 MG tablet Take 500 mg by mouth in the morning.     zinc  gluconate 50 MG tablet Take 50 mg by mouth in the morning.     busPIRone  (BUSPAR ) 15 MG tablet Take 2 tablets (30 mg total) by mouth 2 (two)  times daily. 360 tablet 2   clonazePAM  (KLONOPIN ) 0.5 MG tablet 1 bid 60 tablet 5   desipramine  (NORPRAMIN ) 25 MG tablet Take 1 tablet (25 mg total) by mouth daily. 100 tablet 4   hydrOXYzine  (ATARAX ) 50 MG tablet Take 1 tablet (50 mg total) by mouth 3 (three) times daily as needed for anxiety. 270 tablet 1   venlafaxine  (EFFEXOR ) 100 MG tablet Take 1 tablet (100 mg total) by mouth 3 (three) times daily with meals. 270 tablet 0   No current facility-administered medications for this visit.    Neurologic: Headache: No Seizure: No Paresthesias:NA  Musculoskeletal: Strength & Muscle Tone: within normal limits Gait & Station: normal Patient leans: N/A  Psychiatric Specialty Exam: ROS  Blood pressure (!) 148/85, pulse 76, height 5' 5 (1.651 m), weight (!) 321 lb (145.6 kg).Body mass index is 53.42 kg/m.  General Appearance: Bizarre  Eye Contact:  Good  Speech:  Normal Rate  Volume:  Normal  Mood:  Anxious  Affect:  Congruent  Thought Process:  Goal Directed  Orientation:  NA  Thought Content:  Logical  Suicidal Thoughts:  No  Homicidal Thoughts:  No  Memory:  Negative  Judgement:  Fair  Insight:  NA and Good  Psychomotor Activity:  Normal  Concentration:    Recall:  Good  Fund of Knowledge:  Language: Good  Akathisia:  No  Handed:  Right  AIMS (if indicated):    Assets:  Desire for Improvement  ADL's:  Intact  Cognition: WNL  Sleep:      Treatment Plan Summary:   Today's date is 05/21/2024.  Patient is doing very well.  Her diagnosis is major clinical depression.  She takes desipramine  and Effexor  for this condition.  Her second problem is an adjustment disorder with anxious mood state.  She will continue taking BuSpar  and low-dose Klonopin .  She also takes Vistaril .  Noted now that she is off trazodone  and Lamictal  and doing great.  This patient to return to see me in 4 months.

## 2024-05-22 ENCOUNTER — Ambulatory Visit

## 2024-05-22 ENCOUNTER — Encounter: Payer: Self-pay | Admitting: Rheumatology

## 2024-05-22 ENCOUNTER — Other Ambulatory Visit (HOSPITAL_BASED_OUTPATIENT_CLINIC_OR_DEPARTMENT_OTHER): Payer: Self-pay

## 2024-05-22 ENCOUNTER — Ambulatory Visit: Attending: Rheumatology | Admitting: Rheumatology

## 2024-05-22 ENCOUNTER — Other Ambulatory Visit (HOSPITAL_COMMUNITY): Payer: Self-pay

## 2024-05-22 DIAGNOSIS — M79644 Pain in right finger(s): Secondary | ICD-10-CM

## 2024-05-22 DIAGNOSIS — M79641 Pain in right hand: Secondary | ICD-10-CM

## 2024-05-22 MED ORDER — TRIAMCINOLONE ACETONIDE 40 MG/ML IJ SUSP
20.0000 mg | INTRAMUSCULAR | Status: AC | PRN
  Administered 2024-05-22: 20 mg via INTRA_ARTICULAR

## 2024-05-22 MED ORDER — LIDOCAINE HCL 1 % IJ SOLN
0.5000 mL | INTRAMUSCULAR | Status: AC | PRN
  Administered 2024-05-22: .5 mL

## 2024-05-22 NOTE — Progress Notes (Signed)
 Visit diagnosis: Pain in right thumb  Procedure Note  Patient: Jamie Bennett             Date of Birth: August 15, 1960           MRN: [id number]             Visit Date: 05/22/2024  Procedures: Visit Diagnoses:  1. Pain of right thumb   Patient was here to get the right first MCP injection due to pain and swelling in the MCP joint.  Ultrasound guided injection is preferred based studies that show increased duration, increased effect, greater accuracy, decreased procedural pain, increased response rate, and decreased cost with ultrasound guided versus blind injection.   Verbal informed consent obtained.  Time-out conducted.  Noted no overlying erythema, induration, or other signs of local infection. Ultrasound-guided right first MCP injection: After sterile prep with Betadine, injected 0.5 mL of 1% lidocaine  and 20 mg Kenalog  using a 27-gauge needle, and the MCP joint.    Small Joint Inj: R thumb MCP on 05/22/2024 2:20 PM Indications: pain and joint swelling Details: 27 G needle, ultrasound-guided dorsal approach  Spinal Needle: No  Medications: 0.5 mL lidocaine  1 %; 20 mg triamcinolone  acetonide 40 MG/ML Aspirate: 0 mL  Risk of infection, tendon injury, nerve injury, hypopigmentation and dermal atrophy were discussed. Procedure, treatment alternatives, risks and benefits explained, specific risks discussed. Consent was given by the patient. Immediately prior to procedure a time out was called to verify the correct patient, procedure, equipment, support staff and site/side marked as required. Patient was prepped and draped in the usual sterile fashion.     Postprocedure instructions were given.  Patient tolerated the procedure well.  A splint was applied to the right thumb, to be used for the next 3 days. Maya Nash, MD

## 2024-05-23 ENCOUNTER — Other Ambulatory Visit: Payer: Self-pay

## 2024-05-23 ENCOUNTER — Other Ambulatory Visit (HOSPITAL_COMMUNITY): Payer: Self-pay

## 2024-05-27 ENCOUNTER — Ambulatory Visit: Admitting: Internal Medicine

## 2024-05-27 ENCOUNTER — Encounter: Payer: Self-pay | Admitting: Internal Medicine

## 2024-05-27 VITALS — BP 137/85 | HR 68 | Temp 98.0°F | Resp 16 | Ht 65.0 in | Wt 321.0 lb

## 2024-05-27 DIAGNOSIS — K219 Gastro-esophageal reflux disease without esophagitis: Secondary | ICD-10-CM

## 2024-05-27 DIAGNOSIS — R053 Chronic cough: Secondary | ICD-10-CM | POA: Diagnosis not present

## 2024-05-27 DIAGNOSIS — R0602 Shortness of breath: Secondary | ICD-10-CM | POA: Diagnosis not present

## 2024-05-27 NOTE — Progress Notes (Signed)
 [facility] [address] [place], [place] [address]  Pulmonary Sleep Medicine   Office Visit Note  Patient Name: Jamie Bennett DOB: 11-04-1960 MRN [id number]  Date of Service: 05/27/2024  Complaints/HPI: She states she has been having low energy. She has no fevers no bodyaches. She has noted a little congestion but is her usual congestion. She has been sleeping in hr recliner. Has not been in the hospital. She has been on zepbound  and recently increased her dosage. She notes symptoms worsened of late. Patient has lost weight about 43 pounds  Office Spirometry Results: Peak Flow: (!) 7 L/min FEV1: 2.6 liters FVC: 2.9 liters FEV1/FVC: 89.7 % FVC  % Predicted: 87 % FEV % Predicted: 102 % FeF 25-75: 4.22 liters FeF 25-75 % Predicted: 189   ROS  General: (-) fever, (-) chills, (-) night sweats, (-) weakness Skin: (-) rashes, (-) itching,. Eyes: (-) visual changes, (-) redness, (-) itching. Nose and Sinuses: (-) nasal stuffiness or itchiness, (-) postnasal drip, (-) nosebleeds, (-) sinus trouble. Mouth and Throat: (-) sore throat, (-) hoarseness. Neck: (-) swollen glands, (-) enlarged thyroid , (-) neck pain. Respiratory: + cough, (-) bloody sputum, + shortness of breath, - wheezing. Cardiovascular: - ankle swelling, (-) chest pain. Lymphatic: (-) lymph node enlargement. Neurologic: (-) numbness, (-) tingling. Psychiatric: (-) anxiety, (-) depression   Current Medication: Outpatient Encounter Medications as of 05/27/2024  Medication Sig   acetaminophen  (TYLENOL ) 500 MG tablet Take 500-1,000 mg by mouth every 6 (six) hours as needed (pain.).   budesonide-glycopyrrolate-formoterol (BREZTRI  AEROSPHERE) 160-9-4.8 MCG/ACT AERO inhaler Inhale 2 puffs into the lungs 2 (two) times daily.   busPIRone  (BUSPAR ) 15 MG tablet Take 2 tablets (30 mg total) by mouth 2 (two) times daily.   Calcium Carbonate (CALCIUM 600 PO) Take 600 mg by mouth in the morning and at  bedtime.   Cholecalciferol  125 MCG (5000 UT) TABS Take 2,000 Units by mouth in the morning.   clonazePAM  (KLONOPIN ) 0.5 MG tablet 1 bid   Coenzyme Q10 300 MG CAPS Take 300 mg by mouth in the morning.   cyanocobalamin  (,VITAMIN B-12,) 1000 MCG/ML injection Inject 1,000 mcg into the muscle every 30 (thirty) days.    denosumab  (PROLIA ) 60 MG/ML SOSY injection Inject 1 mL (60 mg total) subcutaneously as directed   desipramine  (NORPRAMIN ) 25 MG tablet Take 1 tablet (25 mg total) by mouth daily.   esomeprazole  (NEXIUM ) 20 MG capsule Take 1 capsule (20 mg total) by mouth daily.   estradiol  (ESTRACE ) 0.1 MG/GM vaginal cream Apply one pea-sized amount around the opening of the urethra daily for 2 weeks, then 3 times weekly moving forward.   hydroxychloroquine  (PLAQUENIL ) 200 MG tablet Take 1 tablet by mouth twice daily   hydrOXYzine  (ATARAX ) 50 MG tablet Take 1 tablet (50 mg total) by mouth 3 (three) times daily as needed for anxiety.   Krill Oil 500 MG CAPS Take 500 mg by mouth daily at 12 noon.   levothyroxine  (SYNTHROID ) 112 MCG tablet Take 1 tablet (112 mcg total) by mouth daily before breakfast.   loratadine  (CLARITIN ) 10 MG tablet Take 10 mg by mouth daily as needed for allergies.   magnesium  oxide (MAG-OX) 400 MG tablet Take 400 mg by mouth every evening.   metoCLOPramide  (REGLAN ) 5 MG tablet Take 1 tablet (5 mg total) by mouth at bedtime.   Multiple Vitamin (MULTIVITAMIN) tablet Take 1 tablet by mouth every evening.   nystatin  (MYCOSTATIN /NYSTOP ) powder APPLY 1 APPLICATION  TOPICALLY TWO TIMES A DAY   polyethylene glycol (MIRALAX  / GLYCOLAX ) 17 g packet Take 17 g by mouth in the morning and at bedtime.   psyllium (METAMUCIL) 58.6 % packet Take 1 packet by mouth in the morning.   sucralfate  (CARAFATE ) 1 g tablet Take 1 tablet (1 g total) by mouth 2 (two) times daily.   tirzepatide  10 MG/0.5ML injection vial Inject 5 mg into the skin.   Tirzepatide -Weight Management 15 MG/0.5ML SOLN Inject  15 mg into the skin. (Patient taking differently: Inject 7.5 mg into the skin.)   tiZANidine  (ZANAFLEX ) 4 MG tablet Take 1 tablet (4 mg total) by mouth 3 (three) times daily.   venlafaxine  (EFFEXOR ) 100 MG tablet Take 1 tablet (100 mg total) by mouth 3 (three) times daily with meals.   vitamin C (ASCORBIC ACID) 500 MG tablet Take 500 mg by mouth in the morning.   zinc  gluconate 50 MG tablet Take 50 mg by mouth in the morning.   No facility-administered encounter medications on file as of 05/27/2024.    Surgical History: Past Surgical History:  Procedure Laterality Date   CATARACT EXTRACTION, BILATERAL  10/2023   CHOLECYSTECTOMY  2004   COLONOSCOPY N/A 07/16/2017   Procedure: COLONOSCOPY;  Surgeon: Viktoria Lamar DASEN, MD;  Location: [facility] ENDOSCOPY;  Service: Endoscopy;  Laterality: N/A;   EXPLORATORY LAPAROTOMY  2009   GASTRIC BYPASS  2003   [facility]   HEEL SPUR EXCISION Bilateral    KNEE ARTHROSCOPY WITH SUBCHONDROPLASTY Left 09/27/2021   Procedure: Left knee subchondroplasty medial tibial plateau;  Surgeon: Kathlynn Sharper, MD;  Location: [facility] ORS;  Service: Orthopedics;  Laterality: Left;   LAPAROTOMY N/A 03/01/2018   Procedure: EXPLORATORY LAPAROTOMY/ UMBILECTOMY;  Surgeon: Dessa Reyes ORN, MD;  Location: [facility] ORS;  Service: General;  Laterality: N/A;   TONSILLECTOMY     VENTRAL HERNIA REPAIR N/A 03/07/2018   Procedure: HERNIA REPAIR VENTRAL ADULT;  Surgeon: Dessa Reyes ORN, MD;  Location: [facility] ORS;  Service: General;  Laterality: N/A;    Medical History: Past Medical History:  Diagnosis Date   Anemia    Anxiety    Bipolar 1 disorder (HCC)    Collagen vascular disease    rhematoid arthritis   Constipation    Dyspnea    Fibromyalgia    GERD (gastroesophageal reflux disease)    Heart murmur    mild-asymptomatic   Hepatic steatosis    History of kidney stones    History of Roux-en-Y gastric bypass    Hypotension    Hypothyroidism    Morbid obesity with BMI of  50.0-59.9, adult (HCC)    Opioid abuse (HCC)    Osteoarthritis    Osteoporosis    PONV (postoperative nausea and vomiting)    nausea only   Rheumatic fever    Rheumatoid arthritis (HCC)    Small bowel obstruction (HCC)    Thyroid  disease     Family History: Family History  Problem Relation Age of Onset   Depression Mother    Dementia Mother    Heart disease Mother    Osteoporosis Mother    Parkinson's disease Father    Liver disease Brother    Colon cancer Neg Hx     Social History: Social History   Socioeconomic History   Marital status: Married    Spouse name: Not on file   Number of children: Not on file   Years of education: Not on file   Highest education level: Not on file  Occupational  History   Not on file  Tobacco Use   Smoking status: Never    Passive exposure: Past   Smokeless tobacco: Never  Vaping Use   Vaping status: Never Used  Substance and Sexual Activity   Alcohol use: Not Currently   Drug use: No   Sexual activity: Yes  Other Topics Concern   Not on file  Social History Narrative   Not on file   Social Drivers of Health   Financial Resource Strain: Low Risk  (12/13/2022)   Received from [facility]   Overall Financial Resource Strain (CARDIA)    Difficulty of Paying Living Expenses: Not hard at all  Food Insecurity: No Food Insecurity (12/13/2022)   Received from [facility]   Hunger Vital Sign    Within the past 12 months, you worried that your food would run out before you got the money to buy more.: Never true    Within the past 12 months, the food you bought just didn't last and you didn't have money to get more.: Never true  Transportation Needs: No Transportation Needs (12/13/2022)   Received from [facility] - Transportation    In the past 12 months, has lack of transportation kept you from medical appointments or from getting medications?: No    Lack of  Transportation (Non-Medical): No  Physical Activity: Insufficiently Active (12/13/2022)   Received from [facility]   Exercise Vital Sign    On average, how many days per week do you engage in moderate to strenuous exercise (like a brisk walk)?: 1 day    On average, how many minutes do you engage in exercise at this level?: 10 min  Stress: Stress Concern Present (12/13/2022)   Received from [facility] - Occupational Stress Questionnaire    Feeling of Stress : To some extent  Social Connections: Socially Isolated (12/13/2022)   Received from [facility]   Social Connection and Isolation Panel    In a typical week, how many times do you talk on the phone with family, friends, or neighbors?: Never    How often do you get together with friends or relatives?: Never    How often do you attend church or religious services?: Never    Do you belong to any clubs or organizations such as church groups, unions, fraternal or athletic groups, or school groups?: No    How often do you attend meetings of the clubs or organizations you belong to?: Never    Are you married, widowed, divorced, separated, never married, or living with a partner?: Married  Intimate Partner Violence: Unknown (05/11/2022)   Received from [facility]   Abuse Screen    Unsafe at Home or Work/School: Not on file    Feels Threatened by Someone?: Not on file    Does Anyone Keep You from Contacting Others or Doint Things Outside the Home?: Not on file    Physical Sign of Abuse Present: Not on file    Vital Signs: Blood pressure 137/85, pulse 68, temperature 98 F (36.7 C), resp. rate 16, height 5' 5 (1.651 m), weight (!) 321 lb (145.6 kg), SpO2 99%, peak flow (!) 7 L/min.  Examination: General Appearance: The patient is well-developed, well-nourished, and in no distress. Skin: Gross inspection of skin  unremarkable. Head: normocephalic, no gross deformities. Eyes: no gross deformities noted. ENT: ears appear grossly  normal no exudates. Neck: Supple. No thyromegaly. No LAD. Respiratory: no rhonchi noted. Cardiovascular: Normal S1 and S2 without murmur or rub. Extremities: No cyanosis. pulses are equal. Neurologic: Alert and oriented. No involuntary movements.  LABS: Recent Results (from the past 2160 hours)  Urinalysis, Complete     Status: Abnormal   Collection Time: 03/20/24  2:52 PM  Result Value Ref Range   Specific Gravity, UA 1.025 1.005 - 1.030   pH, UA 6.0 5.0 - 7.5   Color, UA Yellow Yellow   Appearance Ur Hazy (A) Clear   Leukocytes,UA Negative Negative   Protein,UA Negative Negative/Trace   Glucose, UA Negative Negative   Ketones, UA Negative Negative   RBC, UA Negative Negative   Bilirubin, UA Negative Negative   Urobilinogen, Ur 0.2 0.2 - 1.0 mg/dL   Nitrite, UA Negative Negative   Microscopic Examination Comment     Comment: Microscopic follows if indicated.   Microscopic Examination See below:     Comment: Microscopic was indicated and was performed.  Microscopic Examination     Status: Abnormal   Collection Time: 03/20/24  2:52 PM   Urine  Result Value Ref Range   WBC, UA 0-5 0 - 5 /hpf   RBC, Urine 0-2 0 - 2 /hpf   Epithelial Cells (non renal) 0-10 0 - 10 /hpf   Casts Present (A) None seen /lpf   Cast Type Hyaline casts N/A   Bacteria, UA None seen None seen/Few  CBC with Differential/Platelet     Status: None   Collection Time: 05/07/24  1:41 PM  Result Value Ref Range   WBC 8.4 3.8 - 10.8 Thousand/uL   RBC 4.41 3.80 - 5.10 Million/uL   Hemoglobin 13.5 11.7 - 15.5 g/dL   HCT 58.6 64.9 - 54.9 %   MCV 93.7 80.0 - 100.0 fL   MCH 30.6 27.0 - 33.0 pg   MCHC 32.7 32.0 - 36.0 g/dL    Comment: For adults, a slight decrease in the calculated MCHC value (in the range of 30 to 32 g/dL) is most likely not clinically significant; however, it should  be interpreted with caution in correlation with other red cell parameters and the patient's clinical condition.    RDW 12.1 11.0 - 15.0 %   Platelets 191 140 - 400 Thousand/uL   MPV 9.9 7.5 - 12.5 fL   Neutro Abs 6,073 1,500 - 7,800 cells/uL   Absolute Lymphocytes 1,495 850 - 3,900 cells/uL   Absolute Monocytes 622 200 - 950 cells/uL   Eosinophils Absolute 168 15 - 500 cells/uL   Basophils Absolute 42 0 - 200 cells/uL   Neutrophils Relative % 72.3 %   Total Lymphocyte 17.8 %   Monocytes Relative 7.4 %   Eosinophils Relative 2.0 %   Basophils Relative 0.5 %  Comprehensive metabolic panel with GFR     Status: Abnormal   Collection Time: 05/07/24  1:41 PM  Result Value Ref Range   Glucose, Bld 98 65 - 99 mg/dL    Comment: .            Fasting reference interval .    BUN 23 7 - 25 mg/dL   Creat 9.22 9.49 - 8.94 mg/dL   eGFR 87 > OR = 60 fO/fpw/8.26f7   BUN/Creatinine Ratio SEE NOTE: 6 - 22 (calc)    Comment:    Not Reported: BUN and Creatinine are within    reference range. .    Sodium 141 135 - 146 mmol/L  Potassium 3.7 3.5 - 5.3 mmol/L   Chloride 107 98 - 110 mmol/L   CO2 24 20 - 32 mmol/L   Calcium 9.8 8.6 - 10.4 mg/dL   Total Protein 6.7 6.1 - 8.1 g/dL   Albumin 4.3 3.6 - 5.1 g/dL   Globulin 2.4 1.9 - 3.7 g/dL (calc)   AG Ratio 1.8 1.0 - 2.5 (calc)   Total Bilirubin 0.4 0.2 - 1.2 mg/dL   Alkaline phosphatase (APISO) 161 (H) 37 - 153 U/L   AST 28 10 - 35 U/L   ALT 62 (H) 6 - 29 U/L    Radiology: CT Soft Tissue Neck W Contrast Result Date: 08/03/2022 CLINICAL DATA:  Palate weakness, stridor versus wheezing for 1 month, question upper airway etiology EXAM: CT NECK WITH CONTRAST TECHNIQUE: Multidetector CT imaging of the neck was performed using the standard protocol following the bolus administration of intravenous contrast. RADIATION DOSE REDUCTION: This exam was performed according to the departmental dose-optimization program which includes automated exposure  control, adjustment of the mA and/or kV according to patient size and/or use of iterative reconstruction technique. CONTRAST:  80mL OMNIPAQUE  IOHEXOL  350 MG/ML SOLN COMPARISON:  02/28/2016 CTA neck FINDINGS: Pharynx and larynx: Normal. No mass or swelling. Salivary glands: No inflammation, mass, or stone. Thyroid : Normal. Lymph nodes: None enlarged or abnormal density. Vascular: Patent. Limited intracranial: Negative. Visualized orbits: Negative. Mastoids and visualized paranasal sinuses: Clear. Skeleton: No acute osseous abnormality. Upper chest: No focal pulmonary opacity or pleural effusion. Other: None. IMPRESSION: No acute abnormality in the neck. No etiology is seen for the patient's reported palate weakness or wheezing. Electronically Signed   By: Donald Campion M.D.   On: 08/03/2022 19:49   CT Angio Chest PE W and/or Wo Contrast Result Date: 08/03/2022 CLINICAL DATA:  History of prior COVID infection with persistent cough and wheezing, initial encounter EXAM: CT ANGIOGRAPHY CHEST WITH CONTRAST TECHNIQUE: Multidetector CT imaging of the chest was performed using the standard protocol during bolus administration of intravenous contrast. Multiplanar CT image reconstructions and MIPs were obtained to evaluate the vascular anatomy. RADIATION DOSE REDUCTION: This exam was performed according to the departmental dose-optimization program which includes automated exposure control, adjustment of the mA and/or kV according to patient size and/or use of iterative reconstruction technique. CONTRAST:  80mL OMNIPAQUE  IOHEXOL  350 MG/ML SOLN COMPARISON:  Chest x-ray from earlier in the same day. FINDINGS: Cardiovascular: Ascending aorta is mildly dilated 4.3 cm. No evidence of dissection is seen. No cardiac enlargement is noted. Mitral annular calcifications are seen. The pulmonary artery shows a normal branching pattern bilaterally. No filling defect to suggest pulmonary embolism is seen. Mediastinum/Nodes: Thoracic inlet  is within normal limits. No hilar or mediastinal adenopathy is noted. The esophagus as visualized is within normal limits. Lungs/Pleura: Lungs are well aerated bilaterally. No focal infiltrate or effusion is seen. No parenchymal nodules are seen. Upper Abdomen: Postsurgical changes about the stomach are noted. The remainder of the upper abdomen is within normal limits. Musculoskeletal: Degenerative changes of the thoracic spine are noted. No acute rib abnormality is seen. Review of the MIP images confirms the above findings. IMPRESSION: No evidence of pulmonary emboli. Dilatation of the ascending aorta to 4.3 cm. Recommend annual imaging followup by CTA or MRA. This recommendation follows 2010 ACCF/AHA/AATS/ACR/ASA/SCA/SCAI/SIR/STS/SVM Guidelines for the Diagnosis and Management of Patients with Thoracic Aortic Disease. Circulation. 2010; 121: Z733-z630. Aortic aneurysm NOS (ICD10-I71.9) Electronically Signed   By: Oneil Devonshire M.D.   On: 08/03/2022 19:32   DG  Chest 2 View Result Date: 08/03/2022 CLINICAL DATA:  Cough and wheezing EXAM: CHEST - 2 VIEW COMPARISON:  Chest 07/07/2022 FINDINGS: Heart size upper normal. Vascularity normal. Negative for heart failure. Lungs clear without infiltrate effusion Chronic compression fracture lower thoracic spine unchanged. IMPRESSION: No active cardiopulmonary disease. Electronically Signed   By: Carlin Gaskins M.D.   On: 08/03/2022 15:09    No results found.  US  Guided Needle Placement Result Date: 05/22/2024 Ultrasound guided injection is preferred based studies that show increased duration, increased effect, greater accuracy, decreased procedural pain, increased response rate, and decreased cost with ultrasound guided versus blind injection.   Verbal informed consent obtained.  Time-out conducted.  Noted no overlying erythema, induration, or other signs of local infection. Ultrasound-guided right first MCP injection: After sterile prep with Betadine, injected 0.5 mL  of 1% lidocaine  and 20 mg Kenalog  using a 27-gauge needle, and the MCP joint.     Assessment and Plan: Patient Active Problem List   Diagnosis Date Noted   Moderate persistent asthma with acute exacerbation 08/03/2022   Chronic knee pain (1ry area of Pain) (Left) 04/19/2022   Tricompartment osteoarthritis of knee (Left) 04/19/2022   Primary osteoarthritis of knee (Left) 04/19/2022   Chronic anticoagulation (Plaquenil ) 04/19/2022   History of attempted suicide (07/10/2016) 04/18/2022   History of drug overdose (11/08/2015) 04/18/2022   Chronic pain syndrome 04/17/2022   Pharmacologic therapy 04/17/2022   Disorder of skeletal system 04/17/2022   Problems influencing health status 04/17/2022   Neck pain, musculoskeletal 03/24/2020   Bipolar affective disorder, current episode mixed (HCC) 11/09/2019   Encounter for general adult medical examination with abnormal findings 06/15/2019   Closed fracture of elbow (Left) 06/15/2019   Urinary tract infection without hematuria 06/15/2019   Encounter for long-term (current) use of medications 06/15/2019   Acute upper respiratory infection 06/06/2019   Exposure to COVID-19 virus 06/06/2019   BMI 50.0-59.9, adult (HCC) 06/03/2019   Exercise-induced asthma 12/15/2018   Screening for breast cancer 05/29/2018   Duodenal ulcer disease 05/29/2018   Screening for malignant neoplasm of cervix 05/29/2018   Dysuria 05/29/2018   Surgical wound dehiscence, initial encounter 04/14/2018   Postoperative abdominal hernia with obstruction    Incarcerated ventral hernia 03/22/2018   SBO (small bowel obstruction) (HCC) 03/07/2018   Persistent umbilical sinus 02/22/2018   Atopic dermatitis 12/05/2017   Adjustment disorder with mixed anxiety and depressed mood 03/10/2017   Major depressive disorder 03/10/2017   Primary osteoarthritis of knees (Bilateral) 01/19/2017   Suicide attempt (HCC) 07/10/2016   Fibromyalgia 07/10/2016   High risk medication use  07/10/2016   Hypotension 11/09/2015   Respiratory failure (HCC)    Drug overdose (11/08/2015) (undetermined intent) 11/08/2015   Fatty infiltration of liver 02/16/2015   Hepatic fibrosis 02/16/2015   Iron  deficiency anemia 02/05/2015   Vitamin B 12 deficiency 02/05/2015   OP (osteoporosis) 06/16/2014   Rheumatoid arteritis (HCC) 03/02/2014   Hypothyroidism 03/02/2014   Rheumatic fever without heart involvement 03/02/2014   Adult hypothyroidism 03/02/2014   Arthritis of pelvic region, degenerative 03/02/2014   History of bariatric surgery 11/24/2013    Class: History of   Bipolar affective disorder (HCC) 11/24/2013   Rheumatoid arthritis (HCC) 11/24/2013   Polysubstance (excluding opioids) dependence (HCC) 09/11/2013   Polysubstance dependence (HCC) 09/11/2013   Combined drug dependence excluding opioids (HCC) 09/11/2013   Arthritis or polyarthritis, rheumatoid (HCC) 09/05/2013   Bipolar 1 disorder, depressed (HCC) 09/05/2013    1. SOB (shortness of breath) (Primary) -  Spirometry with Graph was done and looks excellent  2. Obesity, morbid (HCC) She has been having some success with the weight loss. Suggested adding exercise to her regimen  3. GERD without esophagitis GERD is under control  4. Chronic cough Improved at this time   General Counseling: I have discussed the findings of the evaluation and examination with Verneita.  I have also discussed any further diagnostic evaluation thatmay be needed or ordered today. Kaydence verbalizes understanding of the findings of todays visit. We also reviewed her medications today and discussed drug interactions and side effects including but not limited excessive drowsiness and altered mental states. We also discussed that there is always a risk not just to her but also people around her. she has been encouraged to call the office with any questions or concerns that should arise related to todays visit.  Orders Placed This Encounter   Procedures   Spirometry with Graph    Where should this test be performed?:   [facility]     Time spent: 66  I have personally obtained a history, examined the patient, evaluated laboratory and imaging results, formulated the assessment and plan and placed orders.    Elfreda DELENA Bathe, MD [facility] Pulmonary and Critical Care Sleep medicine

## 2024-05-27 NOTE — Patient Instructions (Signed)
 Obesity, Adult Obesity is having too much body fat. Being obese means that your weight is more than what is healthy for you.  BMI (body mass index) is a number that explains how much body fat you have. If you have a BMI of 30 or more, you are obese. Obesity can cause serious health problems, such as: Stroke. Coronary artery disease (CAD). Type 2 diabetes. Some types of cancer. High blood pressure (hypertension). High cholesterol. Gallbladder stones. Obesity can also contribute to: Osteoarthritis. Sleep apnea. Infertility problems. What are the causes? Eating meals each day that are high in calories, sugar, and fat. Drinking a lot of drinks that have sugar in them. Being born with genes that may make you more likely to become obese. Having a medical condition that causes obesity. Taking certain medicines. Sitting a lot (having a sedentary lifestyle). Not getting enough sleep. What increases the risk? Having a family history of obesity. Living in an area with limited access to: [place], recreation centers, or sidewalks. Healthy food choices, such as grocery stores and farmers' markets. What are the signs or symptoms? The main sign is having too much body fat. How is this treated? Treatment for this condition often includes changing your lifestyle. Treatment may include: Changing your diet. This may include making a healthy meal plan. Exercise. This may include activity that causes your heart to beat faster (aerobic exercise) and strength training. Work with your doctor to design a program that works for you. Medicine to help you lose weight. This may be used if you are not able to lose one pound a week after 6 weeks of healthy eating and more exercise. Treating conditions that cause the obesity. Surgery. Options may include gastric banding and gastric bypass. This may be done if: Other treatments have not helped to improve your condition. You have a BMI of 40 or higher. You have  life-threatening health problems related to obesity. Follow these instructions at home: Eating and drinking  Follow advice from your doctor about what to eat and drink. Your doctor may tell you to: Limit fast food, sweets, and processed snack foods. Choose low-fat options. For example, choose low-fat milk instead of whole milk. Eat five or more servings of fruits or vegetables each day. Eat at home more often. This gives you more control over what you eat. Choose healthy foods when you eat out. Learn to read food labels. This will help you learn how much food is in one serving. Keep low-fat snacks available. Avoid drinks that have a lot of sugar in them. These include soda, fruit juice, iced tea with sugar, and flavored milk. Drink enough water to keep your pee (urine) pale yellow. Do not go on fad diets. Physical activity Exercise often, as told by your doctor. Most adults should get up to 150 minutes of moderate-intensity exercise every week.Ask your doctor: What types of exercise are safe for you. How often you should exercise. Warm up and stretch before being active. Do slow stretching after being active (cool down). Rest between times of being active. Lifestyle Work with your doctor and a food expert (dietitian) to set a weight-loss goal that is best for you. Limit your screen time. Find ways to reward yourself that do not involve food. Do not drink alcohol if: Your doctor tells you not to drink. You are pregnant, may be pregnant, or are planning to become pregnant. If you drink alcohol: Limit how much you have to: 0-1 drink a day for women. 0-2 drinks  a day for men. Know how much alcohol is in your drink. In the [place] one drink equals one 12 oz bottle of beer (355 mL), one 5 oz glass of wine (148 mL), or one 1 oz glass of hard liquor (44 mL). General instructions Keep a weight-loss journal. This can help you keep track of: The food that you eat. How much exercise you  get. Take over-the-counter and prescription medicines only as told by your doctor. Take vitamins and supplements only as told by your doctor. Think about joining a support group. Pay attention to your mental health as obesity can lead to depression or self esteem issues. Keep all follow-up visits. Contact a doctor if: You cannot meet your weight-loss goal after you have changed your diet and lifestyle for 6 weeks. You are having trouble breathing. Summary Obesity is having too much body fat. Being obese means that your weight is more than what is healthy for you. Work with your doctor to set a weight-loss goal. Get regular exercise as told by your doctor. This information is not intended to replace advice given to you by your health care provider. Make sure you discuss any questions you have with your health care provider. Document Revised: 02/22/2021 Document Reviewed: 02/22/2021 Elsevier Patient Education  2024 [employer].

## 2024-06-07 ENCOUNTER — Other Ambulatory Visit: Payer: Self-pay | Admitting: Physician Assistant

## 2024-06-07 DIAGNOSIS — K269 Duodenal ulcer, unspecified as acute or chronic, without hemorrhage or perforation: Secondary | ICD-10-CM

## 2024-06-08 ENCOUNTER — Other Ambulatory Visit (HOSPITAL_COMMUNITY): Payer: Self-pay

## 2024-06-09 ENCOUNTER — Other Ambulatory Visit (HOSPITAL_COMMUNITY): Payer: Self-pay

## 2024-06-09 ENCOUNTER — Other Ambulatory Visit: Payer: Self-pay

## 2024-06-09 MED ORDER — SUCRALFATE 1 G PO TABS
1.0000 g | ORAL_TABLET | Freq: Two times a day (BID) | ORAL | 1 refills | Status: AC
  Filled 2024-06-09: qty 180, 90d supply, fill #0

## 2024-06-13 ENCOUNTER — Inpatient Hospital Stay: Payer: PPO | Attending: Oncology

## 2024-06-13 DIAGNOSIS — E538 Deficiency of other specified B group vitamins: Secondary | ICD-10-CM | POA: Diagnosis not present

## 2024-06-13 MED ORDER — CYANOCOBALAMIN 1000 MCG/ML IJ SOLN
1000.0000 ug | Freq: Once | INTRAMUSCULAR | Status: AC
  Administered 2024-06-13: 1000 ug via INTRAMUSCULAR
  Filled 2024-06-13: qty 1

## 2024-06-16 ENCOUNTER — Other Ambulatory Visit (HOSPITAL_COMMUNITY): Payer: Self-pay

## 2024-06-19 ENCOUNTER — Ambulatory Visit (INDEPENDENT_AMBULATORY_CARE_PROVIDER_SITE_OTHER): Admitting: Physician Assistant

## 2024-06-19 ENCOUNTER — Ambulatory Visit: Payer: Self-pay | Admitting: Physician Assistant

## 2024-06-19 ENCOUNTER — Encounter: Payer: Self-pay | Admitting: Physician Assistant

## 2024-06-19 VITALS — BP 115/80 | HR 65 | Temp 98.0°F | Resp 16 | Ht 65.0 in | Wt 322.0 lb

## 2024-06-19 DIAGNOSIS — G4733 Obstructive sleep apnea (adult) (pediatric): Secondary | ICD-10-CM

## 2024-06-19 DIAGNOSIS — Z0001 Encounter for general adult medical examination with abnormal findings: Secondary | ICD-10-CM | POA: Diagnosis not present

## 2024-06-19 DIAGNOSIS — Z6841 Body Mass Index (BMI) 40.0 and over, adult: Secondary | ICD-10-CM

## 2024-06-19 DIAGNOSIS — R300 Dysuria: Secondary | ICD-10-CM | POA: Diagnosis not present

## 2024-06-19 NOTE — Progress Notes (Signed)
 [facility] [address] [place], [place] [address]  Internal MEDICINE  Office Visit Note  Patient Name: Jamie Bennett  [id number]  [id number]  Date of Service: 06/19/2024  Chief Complaint  Patient presents with   Medicare Wellness   Gastroesophageal Reflux    HPI Jamie Bennett presents for an annual well visit Well-appearing 63 y.o.female Routine CRC screening: UTD Routine mammogram: UTD Pap smear: Due and will schedule Labs: done by outside providers -mild OSA with AHI 7, not on PAP, but is working on weight loss for this -started on zepbound ; down 24lbs since last visit. Not approved despite OSA diagnosis, but is getting from pharmaceutical company for discounted price. Endo prescribing this.      06/19/2024    1:34 PM 06/15/2023   10:07 AM 06/09/2022   10:14 AM  MMSE - Mini Mental State Exam  Orientation to time 5 5 5   Orientation to Place 5 5 5   Registration 3 3 3   Attention/ Calculation 5 5 5   Recall 3 3 3   Language- name 2 objects 2 2 2   Language- repeat 1 1 1   Language- follow 3 step command 3 3 3   Language- read & follow direction 1 1 1   Write a sentence 1 1 1   Copy design 1 1 1   Total score 30 30 30     Functional Status Survey: Is the patient deaf or have difficulty hearing?: No Does the patient have difficulty seeing, even when wearing glasses/contacts?: No Does the patient have difficulty concentrating, remembering, or making decisions?: No Does the patient have difficulty walking or climbing stairs?: No Does the patient have difficulty dressing or bathing?: No Does the patient have difficulty doing errands alone such as visiting a doctor's office or shopping?: No     12/11/2022    2:47 PM 06/15/2023   10:07 AM 10/11/2023   11:50 AM 12/13/2023    2:34 PM 06/19/2024    1:33 PM  Fall Risk  Falls in the past year? 0 0 0 0 0  Fall risk Follow up   Falls evaluation completed         06/19/2024    1:33 PM  Depression screen PHQ 2/9   Decreased Interest 0  Down, Depressed, Hopeless 0  PHQ - 2 Score 0       03/08/2017    2:12 PM 02/21/2017   11:09 AM  GAD 7 : Generalized Anxiety Score  Nervous, Anxious, on Edge    Control/stop worrying    Worry too much - different things    Trouble relaxing    Restless    Easily annoyed or irritable    Afraid - awful might happen    Total GAD 7 Score    Anxiety Difficulty       Information is confidential and restricted. Go to Review Flowsheets to unlock data.      Current Medication: Outpatient Encounter Medications as of 06/19/2024  Medication Sig   acetaminophen  (TYLENOL ) 500 MG tablet Take 500-1,000 mg by mouth every 6 (six) hours as needed (pain.).   budesonide-glycopyrrolate-formoterol (BREZTRI  AEROSPHERE) 160-9-4.8 MCG/ACT AERO inhaler Inhale 2 puffs into the lungs 2 (two) times daily.   busPIRone  (BUSPAR ) 15 MG tablet Take 2 tablets (30 mg total) by mouth 2 (two) times daily.   Calcium Carbonate (CALCIUM 600 PO) Take 600 mg by mouth in the morning and at bedtime.   Cholecalciferol  125 MCG (5000 UT) TABS Take 2,000 Units by mouth in the morning.   clonazePAM  (  KLONOPIN ) 0.5 MG tablet 1 bid   Coenzyme Q10 300 MG CAPS Take 300 mg by mouth in the morning.   cyanocobalamin  (,VITAMIN B-12,) 1000 MCG/ML injection Inject 1,000 mcg into the muscle every 30 (thirty) days.    denosumab  (PROLIA ) 60 MG/ML SOSY injection Inject 1 mL (60 mg total) subcutaneously as directed   desipramine  (NORPRAMIN ) 25 MG tablet Take 1 tablet (25 mg total) by mouth daily.   esomeprazole  (NEXIUM ) 20 MG capsule Take 1 capsule (20 mg total) by mouth daily.   estradiol  (ESTRACE ) 0.1 MG/GM vaginal cream Apply one pea-sized amount around the opening of the urethra daily for 2 weeks, then 3 times weekly moving forward.   hydroxychloroquine  (PLAQUENIL ) 200 MG tablet Take 1 tablet by mouth twice daily   hydrOXYzine  (ATARAX ) 50 MG tablet Take 1 tablet (50 mg total) by mouth 3 (three) times daily as needed  for anxiety.   Krill Oil 500 MG CAPS Take 500 mg by mouth daily at 12 noon.   levothyroxine  (SYNTHROID ) 112 MCG tablet Take 1 tablet (112 mcg total) by mouth daily before breakfast.   loratadine  (CLARITIN ) 10 MG tablet Take 10 mg by mouth daily as needed for allergies.   magnesium  oxide (MAG-OX) 400 MG tablet Take 400 mg by mouth every evening.   metoCLOPramide  (REGLAN ) 5 MG tablet Take 1 tablet (5 mg total) by mouth at bedtime.   Multiple Vitamin (MULTIVITAMIN) tablet Take 1 tablet by mouth every evening.   nystatin  (MYCOSTATIN /NYSTOP ) powder APPLY 1 APPLICATION        TOPICALLY TWO TIMES A DAY   polyethylene glycol (MIRALAX  / GLYCOLAX ) 17 g packet Take 17 g by mouth in the morning and at bedtime.   psyllium (METAMUCIL) 58.6 % packet Take 1 packet by mouth in the morning.   sucralfate  (CARAFATE ) 1 g tablet Take 1 tablet (1 g total) by mouth 2 (two) times daily.   Tirzepatide -Weight Management 15 MG/0.5ML SOLN Inject 15 mg into the skin. (Patient taking differently: Inject 7.5 mg into the skin.)   tiZANidine  (ZANAFLEX ) 4 MG tablet Take 1 tablet (4 mg total) by mouth 3 (three) times daily.   venlafaxine  (EFFEXOR ) 100 MG tablet Take 1 tablet (100 mg total) by mouth 3 (three) times daily with meals.   vitamin C (ASCORBIC ACID) 500 MG tablet Take 500 mg by mouth in the morning.   zinc  gluconate 50 MG tablet Take 50 mg by mouth in the morning.   [DISCONTINUED] tirzepatide  10 MG/0.5ML injection vial Inject 5 mg into the skin.   No facility-administered encounter medications on file as of 06/19/2024.    Surgical History: Past Surgical History:  Procedure Laterality Date   CATARACT EXTRACTION, BILATERAL  10/2023   CHOLECYSTECTOMY  2004   COLONOSCOPY N/A 07/16/2017   Procedure: COLONOSCOPY;  Surgeon: Viktoria Lamar DASEN, MD;  Location: [facility] ENDOSCOPY;  Service: Endoscopy;  Laterality: N/A;   EXPLORATORY LAPAROTOMY  2009   GASTRIC BYPASS  2003   [facility]   HEEL SPUR EXCISION Bilateral     KNEE ARTHROSCOPY WITH SUBCHONDROPLASTY Left 09/27/2021   Procedure: Left knee subchondroplasty medial tibial plateau;  Surgeon: Kathlynn Sharper, MD;  Location: [facility] ORS;  Service: Orthopedics;  Laterality: Left;   LAPAROTOMY N/A 03/01/2018   Procedure: EXPLORATORY LAPAROTOMY/ UMBILECTOMY;  Surgeon: Dessa Reyes ORN, MD;  Location: [facility] ORS;  Service: General;  Laterality: N/A;   TONSILLECTOMY     VENTRAL HERNIA REPAIR N/A 03/07/2018   Procedure: HERNIA REPAIR VENTRAL ADULT;  Surgeon: Dessa Reyes  W, MD;  Location: [facility] ORS;  Service: General;  Laterality: N/A;    Medical History: Past Medical History:  Diagnosis Date   Anemia    Anxiety    Bipolar 1 disorder (HCC)    Collagen vascular disease    rhematoid arthritis   Constipation    Dyspnea    Fibromyalgia    GERD (gastroesophageal reflux disease)    Heart murmur    mild-asymptomatic   Hepatic steatosis    History of kidney stones    History of Roux-en-Y gastric bypass    Hypotension    Hypothyroidism    Morbid obesity with BMI of 50.0-59.9, adult (HCC)    Opioid abuse (HCC)    Osteoarthritis    Osteoporosis    PONV (postoperative nausea and vomiting)    nausea only   Rheumatic fever    Rheumatoid arthritis (HCC)    Small bowel obstruction (HCC)    Thyroid  disease     Family History: Family History  Problem Relation Age of Onset   Depression Mother    Dementia Mother    Heart disease Mother    Osteoporosis Mother    Parkinson's disease Father    Liver disease Brother    Colon cancer Neg Hx     Social History   Socioeconomic History   Marital status: Married    Spouse name: Not on file   Number of children: Not on file   Years of education: Not on file   Highest education level: Not on file  Occupational History   Not on file  Tobacco Use   Smoking status: Never    Passive exposure: Past   Smokeless tobacco: Never  Vaping Use   Vaping status: Never Used  Substance and Sexual Activity   Alcohol  use: Not Currently   Drug use: No   Sexual activity: Yes  Other Topics Concern   Not on file  Social History Narrative   Not on file   Social Drivers of Health   Financial Resource Strain: Low Risk  (12/13/2022)   Received from [facility]   Overall Financial Resource Strain (CARDIA)    Difficulty of Paying Living Expenses: Not hard at all  Food Insecurity: No Food Insecurity (12/13/2022)   Received from [facility]   Hunger Vital Sign    Within the past 12 months, you worried that your food would run out before you got the money to buy more.: Never true    Within the past 12 months, the food you bought just didn't last and you didn't have money to get more.: Never true  Transportation Needs: No Transportation Needs (12/13/2022)   Received from [facility] - Transportation    In the past 12 months, has lack of transportation kept you from medical appointments or from getting medications?: No    Lack of Transportation (Non-Medical): No  Physical Activity: Insufficiently Active (12/13/2022)   Received from [facility]   Exercise Vital Sign    On average, how many days per week do you engage in moderate to strenuous exercise (like a brisk walk)?: 1 day    On average, how many minutes do you engage in exercise at this level?: 10 min  Stress: Stress Concern Present (12/13/2022)   Received from [facility] - Occupational Stress Questionnaire    Feeling of Stress : To some extent  Social Connections:  Socially Isolated (12/13/2022)   Received from [facility]   Social Connection and Isolation Panel    In a typical week, how many times do you talk on the phone with family, friends, or neighbors?: Never    How often do you get together with friends or relatives?: Never    How often do you attend church or religious services?: Never     Do you belong to any clubs or organizations such as church groups, unions, fraternal or athletic groups, or school groups?: No    How often do you attend meetings of the clubs or organizations you belong to?: Never    Are you married, widowed, divorced, separated, never married, or living with a partner?: Married  Intimate Partner Violence: Unknown (05/11/2022)   Received from [facility]   Abuse Screen    Unsafe at Home or Work/School: Not on file    Feels Threatened by Someone?: Not on file    Does Anyone Keep You from Contacting Others or Doint Things Outside the Home?: Not on file    Physical Sign of Abuse Present: Not on file      Review of Systems  Constitutional:  Negative for chills, fatigue and unexpected weight change.  HENT:  Positive for postnasal drip. Negative for congestion, rhinorrhea, sneezing and sore throat.   Eyes:  Negative for redness.  Respiratory:  Negative for chest tightness and shortness of breath.   Cardiovascular:  Negative for chest pain and palpitations.  Gastrointestinal:  Negative for abdominal pain, constipation, diarrhea, nausea and vomiting.  Genitourinary:  Negative for dysuria and frequency.  Musculoskeletal:  Positive for arthralgias. Negative for joint swelling and neck pain.  Skin:  Negative for rash.  Neurological: Negative.  Negative for tremors and numbness.  Hematological:  Negative for adenopathy. Does not bruise/bleed easily.  Psychiatric/Behavioral:  Positive for sleep disturbance. Negative for behavioral problems (Depression) and suicidal ideas.     Vital Signs: BP 115/80   Pulse 65   Temp 98 F (36.7 C)   Resp 16   Ht 5' 5 (1.651 m)   Wt (!) 322 lb (146.1 kg)   SpO2 95%   BMI 53.58 kg/m    Physical Exam Vitals and nursing note reviewed.  Constitutional:      Appearance: Normal appearance. She is obese.  HENT:     Head: Normocephalic and atraumatic.  Eyes:     Extraocular Movements: Extraocular movements  intact.  Cardiovascular:     Rate and Rhythm: Normal rate and regular rhythm.  Pulmonary:     Effort: Pulmonary effort is normal.     Breath sounds: Normal breath sounds.  Skin:    General: Skin is warm and dry.  Neurological:     Mental Status: She is alert and oriented to person, place, and time.  Psychiatric:        Mood and Affect: Mood normal.        Behavior: Behavior normal.        Assessment/Plan: 1. Encounter for Medicare annual examination with abnormal findings (Primary) AWV performed, labs done by outside providers, due for pap  2. OSA (obstructive sleep apnea) Overall mild and is working on weight loss  3. Morbid obesity with BMI of 50.0-59.9, adult (HCC) Down 24 lbs since last visit with me in May and is having great success on zepbound  with her endocrinologist. Continue to work on this  4. Dysuria - UA/M w/rflx Culture, Routine     General Counseling:  Jamie Bennett verbalizes understanding of the findings of todays visit and agrees with plan of treatment. I have discussed any further diagnostic evaluation that may be needed or ordered today. We also reviewed her medications today. she has been encouraged to call the office with any questions or concerns that should arise related to todays visit.    Orders Placed This Encounter  Procedures   Microscopic Examination   UA/M w/rflx Culture, Routine    No orders of the defined types were placed in this encounter.   Return in about 6 months (around 12/17/2024) for pap only.   Total time spent:35 Minutes Time spent includes review of chart, medications, test results, and follow up plan with the patient.   [place] Controlled Substance Database was reviewed by me.  This patient was seen by Tinnie Pro, PA-C in collaboration with Dr. Sigrid Bathe as a part of collaborative care agreement.  Tinnie Pro, PA-C Internal medicine

## 2024-06-20 LAB — UA/M W/RFLX CULTURE, ROUTINE
Bilirubin, UA: NEGATIVE
Glucose, UA: NEGATIVE
Ketones, UA: NEGATIVE
Leukocytes,UA: NEGATIVE
Nitrite, UA: NEGATIVE
Protein,UA: NEGATIVE
RBC, UA: NEGATIVE
Specific Gravity, UA: 1.02 (ref 1.005–1.030)
Urobilinogen, Ur: 0.2 mg/dL (ref 0.2–1.0)
pH, UA: 6 (ref 5.0–7.5)

## 2024-06-20 LAB — MICROSCOPIC EXAMINATION: Casts: NONE SEEN /LPF

## 2024-06-24 ENCOUNTER — Other Ambulatory Visit (HOSPITAL_COMMUNITY): Payer: Self-pay

## 2024-07-03 ENCOUNTER — Other Ambulatory Visit: Payer: Self-pay | Admitting: Physician Assistant

## 2024-07-03 ENCOUNTER — Other Ambulatory Visit: Payer: Self-pay

## 2024-07-03 ENCOUNTER — Other Ambulatory Visit (HOSPITAL_BASED_OUTPATIENT_CLINIC_OR_DEPARTMENT_OTHER): Payer: Self-pay

## 2024-07-03 ENCOUNTER — Other Ambulatory Visit (HOSPITAL_COMMUNITY): Payer: Self-pay

## 2024-07-03 DIAGNOSIS — K219 Gastro-esophageal reflux disease without esophagitis: Secondary | ICD-10-CM

## 2024-07-03 MED ORDER — ESOMEPRAZOLE MAGNESIUM 20 MG PO CPDR
20.0000 mg | DELAYED_RELEASE_CAPSULE | Freq: Every day | ORAL | 1 refills | Status: AC
  Filled 2024-07-03: qty 90, 90d supply, fill #0

## 2024-07-05 ENCOUNTER — Other Ambulatory Visit (HOSPITAL_BASED_OUTPATIENT_CLINIC_OR_DEPARTMENT_OTHER): Payer: Self-pay

## 2024-07-05 ENCOUNTER — Other Ambulatory Visit (HOSPITAL_COMMUNITY): Payer: Self-pay

## 2024-07-07 ENCOUNTER — Other Ambulatory Visit (HOSPITAL_BASED_OUTPATIENT_CLINIC_OR_DEPARTMENT_OTHER): Payer: Self-pay

## 2024-07-11 ENCOUNTER — Inpatient Hospital Stay: Attending: Oncology

## 2024-07-12 ENCOUNTER — Other Ambulatory Visit: Payer: Self-pay | Admitting: Physician Assistant

## 2024-07-12 DIAGNOSIS — E039 Hypothyroidism, unspecified: Secondary | ICD-10-CM

## 2024-07-14 ENCOUNTER — Inpatient Hospital Stay: Payer: PPO

## 2024-07-14 ENCOUNTER — Other Ambulatory Visit (HOSPITAL_COMMUNITY): Payer: Self-pay

## 2024-07-14 ENCOUNTER — Other Ambulatory Visit: Payer: Self-pay

## 2024-07-14 MED ORDER — LEVOTHYROXINE SODIUM 112 MCG PO TABS
112.0000 ug | ORAL_TABLET | Freq: Every day | ORAL | 0 refills | Status: AC
  Filled 2024-07-14: qty 90, 90d supply, fill #0

## 2024-07-15 ENCOUNTER — Inpatient Hospital Stay: Payer: PPO

## 2024-08-12 LAB — OPHTHALMOLOGY REPORT-SCANNED: A Comment: NORMAL

## 2024-08-15 ENCOUNTER — Encounter: Payer: Self-pay | Admitting: Nurse Practitioner

## 2024-08-15 ENCOUNTER — Inpatient Hospital Stay: Payer: PPO | Attending: Oncology

## 2024-08-15 ENCOUNTER — Inpatient Hospital Stay (HOSPITAL_BASED_OUTPATIENT_CLINIC_OR_DEPARTMENT_OTHER): Payer: PPO | Admitting: Nurse Practitioner

## 2024-08-15 ENCOUNTER — Other Ambulatory Visit: Payer: Self-pay

## 2024-08-15 ENCOUNTER — Inpatient Hospital Stay: Payer: PPO

## 2024-08-15 VITALS — BP 140/75 | HR 73 | Temp 98.6°F | Resp 20 | Wt 305.7 lb

## 2024-08-15 DIAGNOSIS — D509 Iron deficiency anemia, unspecified: Secondary | ICD-10-CM

## 2024-08-15 DIAGNOSIS — E538 Deficiency of other specified B group vitamins: Secondary | ICD-10-CM

## 2024-08-15 DIAGNOSIS — M0579 Rheumatoid arthritis with rheumatoid factor of multiple sites without organ or systems involvement: Secondary | ICD-10-CM

## 2024-08-15 DIAGNOSIS — D508 Other iron deficiency anemias: Secondary | ICD-10-CM | POA: Diagnosis not present

## 2024-08-15 LAB — IRON AND TIBC
Iron: 79 ug/dL (ref 28–170)
Saturation Ratios: 21 % (ref 10.4–31.8)
TIBC: 370 ug/dL (ref 250–450)
UIBC: 291 ug/dL

## 2024-08-15 LAB — CBC (CANCER CENTER ONLY)
HCT: 39.4 % (ref 36.0–46.0)
Hemoglobin: 13.2 g/dL (ref 12.0–15.0)
MCH: 30.4 pg (ref 26.0–34.0)
MCHC: 33.5 g/dL (ref 30.0–36.0)
MCV: 90.8 fL (ref 80.0–100.0)
Platelet Count: 187 K/uL (ref 150–400)
RBC: 4.34 MIL/uL (ref 3.87–5.11)
RDW: 13 % (ref 11.5–15.5)
WBC Count: 6 K/uL (ref 4.0–10.5)
nRBC: 0 % (ref 0.0–0.2)

## 2024-08-15 LAB — FERRITIN: Ferritin: 147 ng/mL (ref 11–307)

## 2024-08-15 MED ORDER — CYANOCOBALAMIN 1000 MCG/ML IJ SOLN
1000.0000 ug | Freq: Once | INTRAMUSCULAR | Status: AC
  Administered 2024-08-15: 1000 ug via INTRAMUSCULAR
  Filled 2024-08-15: qty 1

## 2024-08-15 MED ORDER — HYDROXYCHLOROQUINE SULFATE 200 MG PO TABS: 200.0000 mg | ORAL_TABLET | Freq: Two times a day (BID) | ORAL | 0 refills | Status: AC

## 2024-08-15 NOTE — Telephone Encounter (Signed)
 Patient called to request a refill of plaquenil  to [facility] in [place] on [address].   Last Fill: 05/12/2024  Eye exam: 08/12/2024   Labs: 05/07/2024 CBC WNL Alk phos is elevated-161.  ALT is elevated-62.  Next Visit: 10/15/2024  Last Visit: 05/05/2024  IK:Myzlfjunpi arthritis involving multiple sites with positive rheumatoid factor   Current Dose per office note on 05/05/2024: Plaquenil  200 mg 1 tablet by mouth twice daily.    Okay to refill Plaquenil ?

## 2024-08-15 NOTE — Progress Notes (Signed)
 "    Hematology/Oncology Consult note [facility]  [phone] [phone]  Patient Care Team: Kristina Tinnie MARLA DEVONNA as PCP - General (Physician Assistant) Melanee Annah BROCKS, MD as Consulting Physician (Oncology)   Name of the patient: Jamie Bennett  [id number]  11-02-1960   Date of visit: 08/15/24  Diagnosis- iron  and b12 deficiency anemia  Chief complaint/ Reason for visit- routine f/u of anemia  Heme/Onc history:  Patient is a 64 year old female with history of gastric bypass surgery in 2003.  She is subsequent developed iron  and B12 deficiency and has been getting IV iron  from time to time.   Interval history- Jamie Bennett is a 64 year old female with vitamin B12 deficiency anemia and iron  deficiency anemia who presents for routine oncology follow-up and anemia management.  She continues to receive monthly vitamin B12 injections for chronic B12 deficiency anemia. Hemoglobin has remained stable, with a most recent value of 13.2 g/dL. She has not required iron  infusions for an extended period and is not taking oral iron  due to poor absorption and increased constipation. She denies dizziness, worsening fatigue, or gastrointestinal bleeding.  Over the past six months, she has been using Zepbound  injections and has achieved a weight loss of approximately 60 pounds. She tolerates the medication well, without significant gastrointestinal side effects. Her daily intake consists of a morning protein drink, Greek yogurt, and typically only dinner, with minimal snacking. She has a longstanding history of constipation, but bowel regularity has improved with Zepbound , Miralax , and Metamucil.  She describes easy bruising and thin, fragile skin, which she attributes to aging and her medication regimen. There is a localized area of persistent erythema, thin skin, and scarring over one knuckle following a cortisone injection approximately six months  ago, with prolonged healing and residual scar. She has had other joint injections without similar complications. She also reports a family history of easy bruising.  She has experienced complications with prior iron  infusions, including extravasation and prolonged skin discoloration at infusion sites, attributed to small veins and difficult intravenous access. She denies current symptoms suggestive of anemia or bleeding beyond easy bruising and slow wound healing.  Over all doing well with no concerns today.    ECOG PS- 1 Pain scale- 0   Review of systems- Review of Systems  Constitutional:  Positive for malaise/fatigue.  Musculoskeletal:  Positive for joint pain.      Allergies  Allergen Reactions   Lactose Intolerance (Gi) Other (See Comments)    Bloating and GI distress   Sulfa  Antibiotics Hives     Past Medical History:  Diagnosis Date   Anemia    Anxiety    Bipolar 1 disorder (HCC)    Collagen vascular disease    rhematoid arthritis   Constipation    Dyspnea    Fibromyalgia    GERD (gastroesophageal reflux disease)    Heart murmur    mild-asymptomatic   Hepatic steatosis    History of kidney stones    History of Roux-en-Y gastric bypass    Hypotension    Hypothyroidism    Morbid obesity with BMI of 50.0-59.9, adult (HCC)    Opioid abuse (HCC)    Osteoarthritis    Osteoporosis    PONV (postoperative nausea and vomiting)    nausea only   Rheumatic fever    Rheumatoid arthritis (HCC)    Small bowel obstruction (HCC)    Thyroid  disease      Past Surgical History:  Procedure Laterality Date  CATARACT EXTRACTION, BILATERAL  10/2023   CHOLECYSTECTOMY  2004   COLONOSCOPY N/A 07/16/2017   Procedure: COLONOSCOPY;  Surgeon: Viktoria Lamar DASEN, MD;  Location: [facility] ENDOSCOPY;  Service: Endoscopy;  Laterality: N/A;   EXPLORATORY LAPAROTOMY  2009   GASTRIC BYPASS  2003   [facility]   HEEL SPUR EXCISION Bilateral    KNEE ARTHROSCOPY WITH SUBCHONDROPLASTY Left  09/27/2021   Procedure: Left knee subchondroplasty medial tibial plateau;  Surgeon: Kathlynn Sharper, MD;  Location: [facility] ORS;  Service: Orthopedics;  Laterality: Left;   LAPAROTOMY N/A 03/01/2018   Procedure: EXPLORATORY LAPAROTOMY/ UMBILECTOMY;  Surgeon: Dessa Reyes ORN, MD;  Location: [facility] ORS;  Service: General;  Laterality: N/A;   TONSILLECTOMY     VENTRAL HERNIA REPAIR N/A 03/07/2018   Procedure: HERNIA REPAIR VENTRAL ADULT;  Surgeon: Dessa Reyes ORN, MD;  Location: [facility] ORS;  Service: General;  Laterality: N/A;    Social History   Socioeconomic History   Marital status: Married    Spouse name: Not on file   Number of children: Not on file   Years of education: Not on file   Highest education level: Not on file  Occupational History   Not on file  Tobacco Use   Smoking status: Never    Passive exposure: Past   Smokeless tobacco: Never  Vaping Use   Vaping status: Never Used  Substance and Sexual Activity   Alcohol use: Not Currently   Drug use: No   Sexual activity: Yes  Other Topics Concern   Not on file  Social History Narrative   Not on file   Social Drivers of Health   Tobacco Use: Low Risk (08/15/2024)   Patient History    Smoking Tobacco Use: Never    Smokeless Tobacco Use: Never    Passive Exposure: Past  Financial Resource Strain: Low Risk  (12/13/2022)   Received from [facility]   Overall Financial Resource Strain (CARDIA)    Difficulty of Paying Living Expenses: Not hard at all  Food Insecurity: No Food Insecurity (12/13/2022)   Received from [facility]   Epic    Within the past 12 months, you worried that your food would run out before you got the money to buy more.: Never true    Within the past 12 months, the food you bought just didn't last and you didn't have money to get more.: Never true  Transportation Needs: No Transportation Needs (12/13/2022)   Received from [facility] -  Transportation    In the past 12 months, has lack of transportation kept you from medical appointments or from getting medications?: No    Lack of Transportation (Non-Medical): No  Physical Activity: Insufficiently Active (12/13/2022)   Received from [facility]   Exercise Vital Sign    On average, how many days per week do you engage in moderate to strenuous exercise (like a brisk walk)?: 1 day    On average, how many minutes do you engage in exercise at this level?: 10 min  Stress: Stress Concern Present (12/13/2022)   Received from [facility] - Occupational Stress Questionnaire    Feeling of Stress : To some extent  Social Connections: Socially Isolated (12/13/2022)   Received from [facility]   Social Connection and Isolation Panel    In a typical week, how many times do you talk on the phone with  family, friends, or neighbors?: Never    How often do you get together with friends or relatives?: Never    How often do you attend church or religious services?: Never    Do you belong to any clubs or organizations such as church groups, unions, fraternal or athletic groups, or school groups?: No    How often do you attend meetings of the clubs or organizations you belong to?: Never    Are you married, widowed, divorced, separated, never married, or living with a partner?: Married  Intimate Partner Violence: Unknown (05/11/2022)   Received from [facility]   Abuse Screen    Unsafe at Home or Work/School: Not on file    Feels Threatened by Someone?: Not on file    Does Anyone Keep You from Contacting Others or Doint Things Outside the Home?: Not on file    Physical Sign of Abuse Present: Not on file  Depression (PHQ2-9): Low Risk (06/19/2024)   Depression (PHQ2-9)    PHQ-2 Score: 0  Alcohol Screen: Low Risk (03/02/2022)   Alcohol Screen    Last Alcohol Screening Score (AUDIT): 0   Housing: Low Risk  (09/13/2023)   Received from [facility]   Epic    In the last 12 months, was there a time when you were not able to pay the mortgage or rent on time?: No    In the past 12 months, how many times have you moved where you were living?: 0    At any time in the past 12 months, were you homeless or living in a shelter (including now)?: No  Utilities: Not At Risk (12/13/2022)   Received from [facility] Utilities    Threatened with loss of utilities: No  Health Literacy: Adequate Health Literacy (12/13/2022)   Received from [facility]   B1300 Health Literacy    Frequency of need for help with medical instructions: Never    Family History  Problem Relation Age of Onset   Depression Mother    Dementia Mother    Heart disease Mother    Osteoporosis Mother    Parkinson's disease Father    Liver disease Brother    Colon cancer Neg Hx      Current Outpatient Medications:    acetaminophen  (TYLENOL ) 500 MG tablet, Take 500-1,000 mg by mouth every 6 (six) hours as needed (pain.)., Disp: , Rfl:    budesonide-glycopyrrolate-formoterol (BREZTRI  AEROSPHERE) 160-9-4.8 MCG/ACT AERO inhaler, Inhale 2 puffs into the lungs 2 (two) times daily., Disp: 10.7 g, Rfl: 5   busPIRone  (BUSPAR ) 15 MG tablet, Take 2 tablets (30 mg total) by mouth 2 (two) times daily., Disp: 360 tablet, Rfl: 2   Calcium Carbonate (CALCIUM 600 PO), Take 600 mg by mouth in the morning and at bedtime., Disp: , Rfl:    Cholecalciferol  125 MCG (5000 UT) TABS, Take 2,000 Units by mouth in the morning., Disp: , Rfl:    clonazePAM  (KLONOPIN ) 0.5 MG tablet, 1 bid, Disp: 60 tablet, Rfl: 5   Coenzyme Q10 300 MG CAPS, Take 300 mg by mouth in the morning., Disp: , Rfl:    cyanocobalamin  (,VITAMIN B-12,) 1000 MCG/ML injection, Inject 1,000 mcg into the muscle every 30 (thirty) days. , Disp: , Rfl:    denosumab  (PROLIA ) 60 MG/ML SOSY injection, Inject 1 mL (60 mg  total) subcutaneously as directed, Disp: 1 mL, Rfl: 0   desipramine  (NORPRAMIN ) 25 MG tablet, Take 1 tablet (25  mg total) by mouth daily., Disp: 100 tablet, Rfl: 4   esomeprazole  (NEXIUM ) 20 MG capsule, Take 1 capsule (20 mg total) by mouth daily., Disp: 90 capsule, Rfl: 1   estradiol  (ESTRACE ) 0.1 MG/GM vaginal cream, Apply one pea-sized amount around the opening of the urethra daily for 2 weeks, then 3 times weekly moving forward., Disp: 42.5 g, Rfl: 12   hydroxychloroquine  (PLAQUENIL ) 200 MG tablet, Take 1 tablet by mouth twice daily, Disp: 180 tablet, Rfl: 0   hydrOXYzine  (ATARAX ) 50 MG tablet, Take 1 tablet (50 mg total) by mouth 3 (three) times daily as needed for anxiety., Disp: 270 tablet, Rfl: 1   Krill Oil 500 MG CAPS, Take 500 mg by mouth daily at 12 noon., Disp: , Rfl:    levothyroxine  (SYNTHROID ) 112 MCG tablet, Take 1 tablet (112 mcg total) by mouth daily before breakfast., Disp: 90 tablet, Rfl: 0   loratadine  (CLARITIN ) 10 MG tablet, Take 10 mg by mouth daily as needed for allergies., Disp: , Rfl:    magnesium  oxide (MAG-OX) 400 MG tablet, Take 400 mg by mouth every evening., Disp: , Rfl:    metoCLOPramide  (REGLAN ) 5 MG tablet, Take 1 tablet (5 mg total) by mouth at bedtime., Disp: 90 tablet, Rfl: 5   Multiple Vitamin (MULTIVITAMIN) tablet, Take 1 tablet by mouth every evening., Disp: , Rfl:    nystatin  (MYCOSTATIN /NYSTOP ) powder, APPLY 1 APPLICATION        TOPICALLY TWO TIMES A DAY, Disp: 30 g, Rfl: 6   polyethylene glycol (MIRALAX  / GLYCOLAX ) 17 g packet, Take 17 g by mouth in the morning and at bedtime., Disp: , Rfl:    psyllium (METAMUCIL) 58.6 % packet, Take 1 packet by mouth in the morning., Disp: , Rfl:    sucralfate  (CARAFATE ) 1 g tablet, Take 1 tablet (1 g total) by mouth 2 (two) times daily., Disp: 180 tablet, Rfl: 1   Tirzepatide -Weight Management 15 MG/0.5ML SOLN, Inject 15 mg into the skin. (Patient taking differently: Inject 7.5 mg into the skin.), Disp: , Rfl:     tiZANidine  (ZANAFLEX ) 4 MG tablet, Take 1 tablet (4 mg total) by mouth 3 (three) times daily., Disp: 270 tablet, Rfl: 1   venlafaxine  (EFFEXOR ) 100 MG tablet, Take 1 tablet (100 mg total) by mouth 3 (three) times daily with meals., Disp: 270 tablet, Rfl: 0   vitamin C (ASCORBIC ACID) 500 MG tablet, Take 500 mg by mouth in the morning., Disp: , Rfl:    zinc  gluconate 50 MG tablet, Take 50 mg by mouth in the morning., Disp: , Rfl:  No current facility-administered medications for this visit.  Facility-Administered Medications Ordered in Other Visits:    cyanocobalamin  (VITAMIN B12) injection 1,000 mcg, 1,000 mcg, Intramuscular, Once, Jamie Tinnie MATSU, NP  Physical exam:  Vitals:   08/15/24 1318  BP: (!) 140/75  Pulse: 73  Resp: 20  Temp: 98.6 F (37 C)  SpO2: 100%  Weight: (!) 305 lb 11.2 oz (138.7 kg)    Physical Exam Cardiovascular:     Rate and Rhythm: Normal rate and regular rhythm.     Heart sounds: Normal heart sounds.  Pulmonary:     Effort: Pulmonary effort is normal.     Breath sounds: Normal breath sounds.  Skin:    General: Skin is warm and dry.  Neurological:     Mental Status: She is alert and oriented to person, place, and time.         Latest Ref Rng & Units  05/07/2024    1:41 PM  CMP  Glucose 65 - 99 mg/dL 98   BUN 7 - 25 mg/dL 23   Creatinine 9.49 - 1.05 mg/dL 9.22   Sodium 864 - 853 mmol/L 141   Potassium 3.5 - 5.3 mmol/L 3.7   Chloride 98 - 110 mmol/L 107   CO2 20 - 32 mmol/L 24   Calcium 8.6 - 10.4 mg/dL 9.8   Total Protein 6.1 - 8.1 g/dL 6.7   Total Bilirubin 0.2 - 1.2 mg/dL 0.4   AST 10 - 35 U/L 28   ALT 6 - 29 U/L 62       Latest Ref Rng & Units 08/15/2024   12:53 PM  CBC  WBC 4.0 - 10.5 K/uL 6.0   Hemoglobin 12.0 - 15.0 g/dL 86.7   Hematocrit 63.9 - 46.0 % 39.4   Platelets 150 - 400 K/uL 187     Assessment and plan- Patient is a 64 y.o. female here for routine follow-up Of iron  deficiency anemia Vitamin B12 deficiency anemia Chronic  and well-controlled with monthly vitamin B12 injections. Hemoglobin stable and normal. - Administered vitamin B12 injection. - Scheduled monthly vitamin B12 injections. - Planned follow-up in 3-4 months.  Iron  deficiency anemia Hemoglobin 13.2 g/dL, CBC normal. Awaiting iron  study results. No current need for iron  supplementation due to prior complications with infusions. - Ordered iron  studies they are pending - Deferred iron  infusion. - Will review iron  study results and contact if intervention needed. - Educated on monitoring for symptoms like dizziness, fatigue, or GI bleeding.   Follow up plan: Proceed with vitamin b12 injection today  F/U for monthly vitamin b12 injections F/U in 4 months see MD/np with labs cbc with diff, ferritin, iron  and tibc, vitamin b12 LP  Morna Lanell MANDES [facility] [phone] 08/15/2024 1:43 PM                "

## 2024-08-19 ENCOUNTER — Other Ambulatory Visit (HOSPITAL_COMMUNITY): Payer: Self-pay | Admitting: Psychiatry

## 2024-08-19 DIAGNOSIS — F603 Borderline personality disorder: Secondary | ICD-10-CM

## 2024-08-19 DIAGNOSIS — F411 Generalized anxiety disorder: Secondary | ICD-10-CM

## 2024-08-19 DIAGNOSIS — F332 Major depressive disorder, recurrent severe without psychotic features: Secondary | ICD-10-CM

## 2024-08-21 ENCOUNTER — Other Ambulatory Visit (HOSPITAL_COMMUNITY): Payer: Self-pay | Admitting: *Deleted

## 2024-08-21 ENCOUNTER — Other Ambulatory Visit (HOSPITAL_COMMUNITY): Payer: Self-pay

## 2024-08-21 ENCOUNTER — Other Ambulatory Visit: Payer: Self-pay

## 2024-08-21 ENCOUNTER — Other Ambulatory Visit (HOSPITAL_BASED_OUTPATIENT_CLINIC_OR_DEPARTMENT_OTHER): Payer: Self-pay

## 2024-08-21 DIAGNOSIS — F411 Generalized anxiety disorder: Secondary | ICD-10-CM

## 2024-08-21 DIAGNOSIS — F603 Borderline personality disorder: Secondary | ICD-10-CM

## 2024-08-21 DIAGNOSIS — F332 Major depressive disorder, recurrent severe without psychotic features: Secondary | ICD-10-CM

## 2024-08-21 MED ORDER — PROLIA 60 MG/ML ~~LOC~~ SOSY
60.0000 mg | PREFILLED_SYRINGE | SUBCUTANEOUS | 0 refills | Status: AC
  Filled 2024-08-29: qty 1, 180d supply, fill #0

## 2024-08-21 MED ORDER — VENLAFAXINE HCL 100 MG PO TABS
100.0000 mg | ORAL_TABLET | Freq: Three times a day (TID) | ORAL | 0 refills | Status: AC
  Filled 2024-08-21 (×2): qty 90, 30d supply, fill #0

## 2024-08-29 ENCOUNTER — Other Ambulatory Visit: Payer: Self-pay

## 2024-08-29 ENCOUNTER — Other Ambulatory Visit: Payer: Self-pay | Admitting: Pharmacy Technician

## 2024-09-01 ENCOUNTER — Other Ambulatory Visit (HOSPITAL_COMMUNITY): Payer: Self-pay

## 2024-09-01 ENCOUNTER — Other Ambulatory Visit: Payer: Self-pay

## 2024-09-02 ENCOUNTER — Other Ambulatory Visit: Payer: Self-pay

## 2024-09-02 ENCOUNTER — Other Ambulatory Visit (HOSPITAL_COMMUNITY): Payer: Self-pay

## 2024-09-02 MED ORDER — JUBBONTI 60 MG/ML ~~LOC~~ SOSY
PREFILLED_SYRINGE | SUBCUTANEOUS | 0 refills | Status: AC
  Filled 2024-09-02 – 2024-09-03 (×2): qty 1, 180d supply, fill #0

## 2024-09-03 ENCOUNTER — Other Ambulatory Visit: Payer: Self-pay

## 2024-09-03 NOTE — Progress Notes (Signed)
 Specialty Pharmacy Refill Coordination Note  Jamie Bennett is a 64 y.o. female contacted today regarding refills of specialty medication(s) Denosumab -bbdz (Jubbonti )   Patient requested Delivery   Delivery date: 09/17/24   Verified address: [facility] - [address] [place], [place] [address]   Medication will be filled on: 09/16/24   Appointment 09/23/24

## 2024-09-09 ENCOUNTER — Ambulatory Visit (HOSPITAL_COMMUNITY): Admitting: Psychiatry

## 2024-09-17 ENCOUNTER — Ambulatory Visit (HOSPITAL_COMMUNITY): Admitting: Psychiatry

## 2024-09-19 ENCOUNTER — Inpatient Hospital Stay

## 2024-10-15 ENCOUNTER — Ambulatory Visit: Admitting: Rheumatology

## 2024-10-17 ENCOUNTER — Inpatient Hospital Stay

## 2024-11-14 ENCOUNTER — Inpatient Hospital Stay

## 2024-11-17 ENCOUNTER — Inpatient Hospital Stay

## 2024-12-12 ENCOUNTER — Inpatient Hospital Stay

## 2024-12-12 ENCOUNTER — Inpatient Hospital Stay: Admitting: Oncology

## 2024-12-18 ENCOUNTER — Ambulatory Visit: Admitting: Physician Assistant

## 2025-05-26 ENCOUNTER — Ambulatory Visit: Admitting: Internal Medicine

## 2025-06-22 ENCOUNTER — Ambulatory Visit: Admitting: Physician Assistant
